# Patient Record
Sex: Female | Born: 1950
Health system: Southern US, Community
[De-identification: ages and names within clinical notes are randomized; demographics above are authoritative.]

## PROBLEM LIST (undated history)

## (undated) ENCOUNTER — Emergency Department (HOSPITAL_COMMUNITY): Admission: EM | Payer: Commercial Managed Care - HMO | Source: Home / Self Care

## (undated) DIAGNOSIS — F32A Depression, unspecified: Secondary | ICD-10-CM

## (undated) DIAGNOSIS — J45909 Unspecified asthma, uncomplicated: Secondary | ICD-10-CM

## (undated) DIAGNOSIS — T4145XA Adverse effect of unspecified anesthetic, initial encounter: Secondary | ICD-10-CM

## (undated) DIAGNOSIS — J189 Pneumonia, unspecified organism: Secondary | ICD-10-CM

## (undated) DIAGNOSIS — D649 Anemia, unspecified: Secondary | ICD-10-CM

## (undated) DIAGNOSIS — R519 Headache, unspecified: Secondary | ICD-10-CM

## (undated) DIAGNOSIS — Z8371 Family history of colonic polyps: Secondary | ICD-10-CM

## (undated) DIAGNOSIS — T8859XA Other complications of anesthesia, initial encounter: Secondary | ICD-10-CM

## (undated) DIAGNOSIS — G2401 Drug induced subacute dyskinesia: Secondary | ICD-10-CM

## (undated) DIAGNOSIS — I341 Nonrheumatic mitral (valve) prolapse: Secondary | ICD-10-CM

## (undated) DIAGNOSIS — R0600 Dyspnea, unspecified: Secondary | ICD-10-CM

## (undated) DIAGNOSIS — T43505A Adverse effect of unspecified antipsychotics and neuroleptics, initial encounter: Secondary | ICD-10-CM

## (undated) DIAGNOSIS — I10 Essential (primary) hypertension: Secondary | ICD-10-CM

## (undated) DIAGNOSIS — K219 Gastro-esophageal reflux disease without esophagitis: Secondary | ICD-10-CM

## (undated) DIAGNOSIS — Z87898 Personal history of other specified conditions: Secondary | ICD-10-CM

## (undated) DIAGNOSIS — M159 Polyosteoarthritis, unspecified: Secondary | ICD-10-CM

## (undated) DIAGNOSIS — K449 Diaphragmatic hernia without obstruction or gangrene: Secondary | ICD-10-CM

## (undated) DIAGNOSIS — J449 Chronic obstructive pulmonary disease, unspecified: Secondary | ICD-10-CM

## (undated) DIAGNOSIS — I471 Supraventricular tachycardia, unspecified: Secondary | ICD-10-CM

## (undated) DIAGNOSIS — B3781 Candidal esophagitis: Secondary | ICD-10-CM

## (undated) DIAGNOSIS — I639 Cerebral infarction, unspecified: Secondary | ICD-10-CM

## (undated) DIAGNOSIS — F419 Anxiety disorder, unspecified: Secondary | ICD-10-CM

## (undated) DIAGNOSIS — E119 Type 2 diabetes mellitus without complications: Secondary | ICD-10-CM

## (undated) DIAGNOSIS — Z9289 Personal history of other medical treatment: Secondary | ICD-10-CM

## (undated) DIAGNOSIS — Z8669 Personal history of other diseases of the nervous system and sense organs: Secondary | ICD-10-CM

## (undated) DIAGNOSIS — S42409A Unspecified fracture of lower end of unspecified humerus, initial encounter for closed fracture: Secondary | ICD-10-CM

## (undated) DIAGNOSIS — F319 Bipolar disorder, unspecified: Secondary | ICD-10-CM

## (undated) DIAGNOSIS — J302 Other seasonal allergic rhinitis: Secondary | ICD-10-CM

## (undated) DIAGNOSIS — N189 Chronic kidney disease, unspecified: Secondary | ICD-10-CM

## (undated) DIAGNOSIS — E785 Hyperlipidemia, unspecified: Secondary | ICD-10-CM

## (undated) DIAGNOSIS — F329 Major depressive disorder, single episode, unspecified: Secondary | ICD-10-CM

## (undated) DIAGNOSIS — Z83719 Family history of colon polyps, unspecified: Secondary | ICD-10-CM

## (undated) HISTORY — DX: Adverse effect of unspecified antipsychotics and neuroleptics, initial encounter: T43.505A

## (undated) HISTORY — DX: Gastro-esophageal reflux disease without esophagitis: K21.9

## (undated) HISTORY — DX: Bipolar disorder, unspecified: F31.9

## (undated) HISTORY — PX: TONSILLECTOMY: SUR1361

## (undated) HISTORY — PX: CARPAL TUNNEL RELEASE: SHX101

## (undated) HISTORY — PX: EYE SURGERY: SHX253

## (undated) HISTORY — DX: Type 2 diabetes mellitus without complications: E11.9

## (undated) HISTORY — PX: KNEE SURGERY: SHX244

## (undated) HISTORY — DX: Personal history of other medical treatment: Z92.89

## (undated) HISTORY — DX: Drug induced subacute dyskinesia: G24.01

## (undated) HISTORY — DX: Personal history of other diseases of the nervous system and sense organs: Z86.69

## (undated) HISTORY — DX: Depression, unspecified: F32.A

## (undated) HISTORY — PX: NOSE SURGERY: SHX723

## (undated) HISTORY — DX: Major depressive disorder, single episode, unspecified: F32.9

## (undated) HISTORY — DX: Unspecified fracture of lower end of unspecified humerus, initial encounter for closed fracture: S42.409A

## (undated) HISTORY — DX: Other seasonal allergic rhinitis: J30.2

## (undated) HISTORY — DX: Family history of colonic polyps: Z83.71

## (undated) HISTORY — DX: Diaphragmatic hernia without obstruction or gangrene: K44.9

## (undated) HISTORY — DX: Supraventricular tachycardia, unspecified: I47.10

## (undated) HISTORY — PX: FINGER SURGERY: SHX640

## (undated) HISTORY — PX: EXTERNAL EAR SURGERY: SHX627

## (undated) HISTORY — DX: Polyosteoarthritis, unspecified: M15.9

## (undated) HISTORY — PX: ELBOW SURGERY: SHX618

## (undated) HISTORY — DX: Candidal esophagitis: B37.81

## (undated) HISTORY — DX: Family history of colon polyps, unspecified: Z83.719

## (undated) HISTORY — DX: Hyperlipidemia, unspecified: E78.5

---

## 1998-04-05 ENCOUNTER — Other Ambulatory Visit: Admission: RE | Admit: 1998-04-05 | Discharge: 1998-04-05 | Payer: Self-pay | Admitting: Gynecology

## 1998-04-22 ENCOUNTER — Ambulatory Visit (HOSPITAL_COMMUNITY): Admission: RE | Admit: 1998-04-22 | Discharge: 1998-04-22 | Payer: Self-pay | Admitting: *Deleted

## 1998-04-29 ENCOUNTER — Other Ambulatory Visit: Admission: RE | Admit: 1998-04-29 | Discharge: 1998-04-29 | Payer: Self-pay | Admitting: Gynecology

## 1999-05-23 ENCOUNTER — Other Ambulatory Visit: Admission: RE | Admit: 1999-05-23 | Discharge: 1999-05-23 | Payer: Self-pay | Admitting: Gynecology

## 1999-08-01 ENCOUNTER — Encounter: Admission: RE | Admit: 1999-08-01 | Discharge: 1999-08-01 | Payer: Self-pay | Admitting: Family Medicine

## 2000-01-03 ENCOUNTER — Ambulatory Visit (HOSPITAL_BASED_OUTPATIENT_CLINIC_OR_DEPARTMENT_OTHER): Admission: RE | Admit: 2000-01-03 | Discharge: 2000-01-03 | Payer: Self-pay | Admitting: Orthopaedic Surgery

## 2000-01-17 ENCOUNTER — Ambulatory Visit (HOSPITAL_BASED_OUTPATIENT_CLINIC_OR_DEPARTMENT_OTHER): Admission: RE | Admit: 2000-01-17 | Discharge: 2000-01-17 | Payer: Self-pay | Admitting: Orthopaedic Surgery

## 2000-06-08 ENCOUNTER — Ambulatory Visit (HOSPITAL_COMMUNITY): Admission: RE | Admit: 2000-06-08 | Discharge: 2000-06-08 | Payer: Self-pay | Admitting: Gastroenterology

## 2000-06-08 ENCOUNTER — Encounter: Payer: Self-pay | Admitting: Gastroenterology

## 2000-07-27 ENCOUNTER — Other Ambulatory Visit: Admission: RE | Admit: 2000-07-27 | Discharge: 2000-07-27 | Payer: Self-pay | Admitting: Gynecology

## 2000-07-27 ENCOUNTER — Encounter (INDEPENDENT_AMBULATORY_CARE_PROVIDER_SITE_OTHER): Payer: Self-pay

## 2000-10-23 HISTORY — PX: OTHER SURGICAL HISTORY: SHX169

## 2003-02-12 ENCOUNTER — Emergency Department (HOSPITAL_COMMUNITY): Admission: EM | Admit: 2003-02-12 | Discharge: 2003-02-12 | Payer: Self-pay | Admitting: Emergency Medicine

## 2003-02-12 ENCOUNTER — Encounter: Payer: Self-pay | Admitting: Emergency Medicine

## 2003-06-16 ENCOUNTER — Encounter: Payer: Self-pay | Admitting: Internal Medicine

## 2003-06-17 ENCOUNTER — Other Ambulatory Visit: Admission: RE | Admit: 2003-06-17 | Discharge: 2003-06-17 | Payer: Self-pay | Admitting: Gynecology

## 2003-06-23 ENCOUNTER — Encounter: Admission: RE | Admit: 2003-06-23 | Discharge: 2003-06-23 | Payer: Self-pay | Admitting: Gynecology

## 2003-06-23 ENCOUNTER — Encounter: Payer: Self-pay | Admitting: Gynecology

## 2003-06-25 ENCOUNTER — Ambulatory Visit (HOSPITAL_COMMUNITY): Admission: RE | Admit: 2003-06-25 | Discharge: 2003-06-25 | Payer: Self-pay | Admitting: Internal Medicine

## 2003-06-25 ENCOUNTER — Encounter: Payer: Self-pay | Admitting: Internal Medicine

## 2003-10-10 ENCOUNTER — Ambulatory Visit (HOSPITAL_BASED_OUTPATIENT_CLINIC_OR_DEPARTMENT_OTHER): Admission: RE | Admit: 2003-10-10 | Discharge: 2003-10-10 | Payer: Self-pay | Admitting: Pulmonary Disease

## 2003-11-18 ENCOUNTER — Inpatient Hospital Stay (HOSPITAL_COMMUNITY): Admission: EM | Admit: 2003-11-18 | Discharge: 2003-11-24 | Payer: Self-pay | Admitting: Psychiatry

## 2003-11-18 ENCOUNTER — Emergency Department (HOSPITAL_COMMUNITY): Admission: EM | Admit: 2003-11-18 | Discharge: 2003-11-18 | Payer: Self-pay | Admitting: Emergency Medicine

## 2003-12-15 ENCOUNTER — Encounter: Payer: Self-pay | Admitting: Internal Medicine

## 2003-12-17 ENCOUNTER — Encounter: Admission: RE | Admit: 2003-12-17 | Discharge: 2003-12-17 | Payer: Self-pay | Admitting: *Deleted

## 2004-06-06 ENCOUNTER — Emergency Department (HOSPITAL_COMMUNITY): Admission: EM | Admit: 2004-06-06 | Discharge: 2004-06-06 | Payer: Self-pay | Admitting: Emergency Medicine

## 2004-07-06 ENCOUNTER — Encounter: Admission: RE | Admit: 2004-07-06 | Discharge: 2004-07-06 | Payer: Self-pay | Admitting: Internal Medicine

## 2004-07-08 ENCOUNTER — Encounter: Payer: Self-pay | Admitting: Internal Medicine

## 2004-08-10 ENCOUNTER — Encounter: Admission: RE | Admit: 2004-08-10 | Discharge: 2004-09-12 | Payer: Self-pay | Admitting: Neurosurgery

## 2004-10-25 ENCOUNTER — Ambulatory Visit: Payer: Self-pay | Admitting: Internal Medicine

## 2004-11-02 ENCOUNTER — Ambulatory Visit: Payer: Self-pay | Admitting: Internal Medicine

## 2004-11-03 ENCOUNTER — Ambulatory Visit: Payer: Self-pay | Admitting: Internal Medicine

## 2004-11-21 ENCOUNTER — Ambulatory Visit: Payer: Self-pay | Admitting: Internal Medicine

## 2004-11-25 ENCOUNTER — Ambulatory Visit: Payer: Self-pay | Admitting: Internal Medicine

## 2004-12-23 ENCOUNTER — Ambulatory Visit: Payer: Self-pay | Admitting: Internal Medicine

## 2005-01-18 ENCOUNTER — Encounter: Admission: RE | Admit: 2005-01-18 | Discharge: 2005-01-18 | Payer: Self-pay | Admitting: Internal Medicine

## 2005-02-20 ENCOUNTER — Ambulatory Visit: Payer: Self-pay | Admitting: Internal Medicine

## 2005-06-28 ENCOUNTER — Other Ambulatory Visit: Admission: RE | Admit: 2005-06-28 | Discharge: 2005-06-28 | Payer: Self-pay | Admitting: Gynecology

## 2005-06-29 ENCOUNTER — Ambulatory Visit: Payer: Self-pay | Admitting: Internal Medicine

## 2005-07-06 ENCOUNTER — Ambulatory Visit: Payer: Self-pay | Admitting: Internal Medicine

## 2005-07-14 ENCOUNTER — Encounter: Admission: RE | Admit: 2005-07-14 | Discharge: 2005-07-14 | Payer: Self-pay | Admitting: Internal Medicine

## 2005-07-19 ENCOUNTER — Encounter: Admission: RE | Admit: 2005-07-19 | Discharge: 2005-10-17 | Payer: Self-pay | Admitting: Internal Medicine

## 2005-08-17 ENCOUNTER — Ambulatory Visit: Payer: Self-pay | Admitting: Internal Medicine

## 2005-08-24 ENCOUNTER — Ambulatory Visit: Payer: Self-pay | Admitting: Internal Medicine

## 2005-11-24 ENCOUNTER — Ambulatory Visit: Payer: Self-pay | Admitting: Internal Medicine

## 2005-12-01 ENCOUNTER — Ambulatory Visit: Payer: Self-pay | Admitting: Internal Medicine

## 2005-12-27 ENCOUNTER — Encounter: Admission: RE | Admit: 2005-12-27 | Discharge: 2006-03-27 | Payer: Self-pay | Admitting: Internal Medicine

## 2006-02-07 ENCOUNTER — Ambulatory Visit: Payer: Self-pay | Admitting: Internal Medicine

## 2006-04-03 ENCOUNTER — Ambulatory Visit: Payer: Self-pay | Admitting: Internal Medicine

## 2006-04-10 ENCOUNTER — Ambulatory Visit: Payer: Self-pay | Admitting: Internal Medicine

## 2006-04-18 ENCOUNTER — Encounter: Admission: RE | Admit: 2006-04-18 | Discharge: 2006-07-17 | Payer: Self-pay | Admitting: Internal Medicine

## 2006-06-27 ENCOUNTER — Other Ambulatory Visit: Admission: RE | Admit: 2006-06-27 | Discharge: 2006-06-27 | Payer: Self-pay | Admitting: Gynecology

## 2006-07-10 ENCOUNTER — Encounter: Admission: RE | Admit: 2006-07-10 | Discharge: 2006-07-10 | Payer: Self-pay | Admitting: Internal Medicine

## 2006-07-16 ENCOUNTER — Encounter: Admission: RE | Admit: 2006-07-16 | Discharge: 2006-07-16 | Payer: Self-pay | Admitting: Internal Medicine

## 2006-08-10 ENCOUNTER — Ambulatory Visit: Payer: Self-pay | Admitting: Internal Medicine

## 2006-08-10 LAB — CONVERTED CEMR LAB: Hgb A1c MFr Bld: 6.5 % — ABNORMAL HIGH (ref 4.6–6.0)

## 2006-08-17 ENCOUNTER — Ambulatory Visit: Payer: Self-pay | Admitting: Internal Medicine

## 2006-09-10 ENCOUNTER — Other Ambulatory Visit: Admission: RE | Admit: 2006-09-10 | Discharge: 2006-09-10 | Payer: Self-pay | Admitting: Gynecology

## 2006-11-09 ENCOUNTER — Ambulatory Visit: Payer: Self-pay | Admitting: Internal Medicine

## 2006-11-09 LAB — CONVERTED CEMR LAB
BUN: 17 mg/dL (ref 6–23)
CO2: 31 meq/L (ref 19–32)
Calcium: 9.9 mg/dL (ref 8.4–10.5)
Chloride: 102 meq/L (ref 96–112)
Cholesterol: 102 mg/dL (ref 0–200)
Creatinine, Ser: 1.3 mg/dL — ABNORMAL HIGH (ref 0.4–1.2)
GFR calc Af Amer: 55 mL/min
GFR calc non Af Amer: 45 mL/min
Glucose, Bld: 117 mg/dL — ABNORMAL HIGH (ref 70–99)
HDL: 29.6 mg/dL — ABNORMAL LOW (ref >39.0)
Hgb A1c MFr Bld: 6.8 % — ABNORMAL HIGH (ref 4.6–6.0)
LDL Cholesterol: 46 mg/dL (ref 0–99)
Potassium: 4.2 meq/L (ref 3.5–5.1)
Sodium: 142 meq/L (ref 135–145)
TSH: 2.29 microintl units/mL (ref 0.35–5.50)
Total CHOL/HDL Ratio: 3.4
Triglycerides: 131 mg/dL (ref 0–149)
VLDL: 26 mg/dL (ref 0–40)

## 2006-11-16 ENCOUNTER — Ambulatory Visit: Payer: Self-pay | Admitting: Internal Medicine

## 2007-01-17 ENCOUNTER — Emergency Department (HOSPITAL_COMMUNITY): Admission: EM | Admit: 2007-01-17 | Discharge: 2007-01-17 | Payer: Self-pay | Admitting: Emergency Medicine

## 2007-02-01 ENCOUNTER — Ambulatory Visit: Payer: Self-pay | Admitting: Internal Medicine

## 2007-02-07 ENCOUNTER — Encounter: Payer: Self-pay | Admitting: Internal Medicine

## 2007-02-08 ENCOUNTER — Ambulatory Visit: Payer: Self-pay | Admitting: Internal Medicine

## 2007-02-08 LAB — CONVERTED CEMR LAB
BUN: 17 mg/dL (ref 6–23)
CO2: 28 meq/L (ref 19–32)
Calcium: 9.2 mg/dL (ref 8.4–10.5)
Chloride: 107 meq/L (ref 96–112)
Creatinine, Ser: 1.2 mg/dL (ref 0.4–1.2)
Creatinine,U: 228.7 mg/dL
GFR calc Af Amer: 60 mL/min
GFR calc non Af Amer: 50 mL/min
Glucose, Bld: 115 mg/dL — ABNORMAL HIGH (ref 70–99)
Hgb A1c MFr Bld: 6.7 % — ABNORMAL HIGH (ref 4.6–6.0)
Microalb Creat Ratio: 0.9 mg/g (ref 0.0–30.0)
Microalb, Ur: 0.2 mg/dL (ref 0.0–1.9)
Potassium: 3.7 meq/L (ref 3.5–5.1)
Sodium: 143 meq/L (ref 135–145)

## 2007-02-15 ENCOUNTER — Ambulatory Visit: Payer: Self-pay | Admitting: Internal Medicine

## 2007-02-28 ENCOUNTER — Encounter: Payer: Self-pay | Admitting: Internal Medicine

## 2007-04-08 ENCOUNTER — Encounter: Payer: Self-pay | Admitting: Internal Medicine

## 2007-04-08 DIAGNOSIS — K219 Gastro-esophageal reflux disease without esophagitis: Secondary | ICD-10-CM | POA: Insufficient documentation

## 2007-04-08 DIAGNOSIS — M545 Low back pain, unspecified: Secondary | ICD-10-CM | POA: Insufficient documentation

## 2007-04-08 DIAGNOSIS — E785 Hyperlipidemia, unspecified: Secondary | ICD-10-CM | POA: Insufficient documentation

## 2007-04-08 DIAGNOSIS — I059 Rheumatic mitral valve disease, unspecified: Secondary | ICD-10-CM | POA: Insufficient documentation

## 2007-04-08 DIAGNOSIS — R079 Chest pain, unspecified: Secondary | ICD-10-CM | POA: Insufficient documentation

## 2007-04-22 ENCOUNTER — Ambulatory Visit: Payer: Self-pay | Admitting: Internal Medicine

## 2007-05-20 ENCOUNTER — Telehealth: Payer: Self-pay | Admitting: Internal Medicine

## 2007-06-10 ENCOUNTER — Ambulatory Visit: Payer: Self-pay | Admitting: Internal Medicine

## 2007-06-15 LAB — CONVERTED CEMR LAB
ALT: 26 units/L (ref 0–35)
AST: 29 units/L (ref 0–37)
BUN: 18 mg/dL (ref 6–23)
Cholesterol: 89 mg/dL (ref 0–200)
Creatinine, Ser: 1.3 mg/dL — ABNORMAL HIGH (ref 0.4–1.2)
HDL: 24.4 mg/dL — ABNORMAL LOW (ref >39.0)
Hgb A1c MFr Bld: 6.6 % — ABNORMAL HIGH (ref 4.6–6.0)
LDL Cholesterol: 44 mg/dL (ref 0–99)
TSH: 1.86 microintl units/mL (ref 0.35–5.50)
Total CHOL/HDL Ratio: 3.6
Triglycerides: 103 mg/dL (ref 0–149)
VLDL: 21 mg/dL (ref 0–40)

## 2007-06-17 ENCOUNTER — Telehealth: Payer: Self-pay | Admitting: Internal Medicine

## 2007-06-17 ENCOUNTER — Ambulatory Visit: Payer: Self-pay | Admitting: Internal Medicine

## 2007-06-17 DIAGNOSIS — I10 Essential (primary) hypertension: Secondary | ICD-10-CM | POA: Insufficient documentation

## 2007-06-19 ENCOUNTER — Encounter: Payer: Self-pay | Admitting: Internal Medicine

## 2007-06-20 ENCOUNTER — Ambulatory Visit: Payer: Self-pay | Admitting: Internal Medicine

## 2007-07-31 ENCOUNTER — Telehealth: Payer: Self-pay | Admitting: *Deleted

## 2007-08-06 ENCOUNTER — Telehealth: Payer: Self-pay | Admitting: *Deleted

## 2007-08-20 ENCOUNTER — Other Ambulatory Visit: Admission: RE | Admit: 2007-08-20 | Discharge: 2007-08-20 | Payer: Self-pay | Admitting: Gynecology

## 2007-09-12 ENCOUNTER — Encounter: Admission: RE | Admit: 2007-09-12 | Discharge: 2007-09-12 | Payer: Self-pay | Admitting: Gynecology

## 2007-10-28 ENCOUNTER — Ambulatory Visit: Payer: Self-pay | Admitting: Internal Medicine

## 2007-10-30 LAB — CONVERTED CEMR LAB
BUN: 35 mg/dL — ABNORMAL HIGH (ref 6–23)
CO2: 31 meq/L (ref 19–32)
Calcium: 10 mg/dL (ref 8.4–10.5)
Chloride: 100 meq/L (ref 96–112)
Creatinine, Ser: 1.8 mg/dL — ABNORMAL HIGH (ref 0.4–1.2)
GFR calc Af Amer: 38 mL/min
GFR calc non Af Amer: 31 mL/min
Glucose, Bld: 102 mg/dL — ABNORMAL HIGH (ref 70–99)
Hgb A1c MFr Bld: 6.9 % — ABNORMAL HIGH (ref 4.6–6.0)
Potassium: 4.6 meq/L (ref 3.5–5.1)
Sodium: 141 meq/L (ref 135–145)

## 2007-11-04 ENCOUNTER — Ambulatory Visit: Payer: Self-pay | Admitting: Internal Medicine

## 2007-11-04 DIAGNOSIS — Z860100 Personal history of colon polyps, unspecified: Secondary | ICD-10-CM | POA: Insufficient documentation

## 2007-11-04 DIAGNOSIS — R42 Dizziness and giddiness: Secondary | ICD-10-CM | POA: Insufficient documentation

## 2007-11-04 LAB — CONVERTED CEMR LAB: Blood Glucose, Fingerstick: 85

## 2007-12-03 ENCOUNTER — Telehealth: Payer: Self-pay | Admitting: Internal Medicine

## 2007-12-04 ENCOUNTER — Ambulatory Visit: Payer: Self-pay | Admitting: Internal Medicine

## 2007-12-04 DIAGNOSIS — R259 Unspecified abnormal involuntary movements: Secondary | ICD-10-CM | POA: Insufficient documentation

## 2007-12-04 LAB — CONVERTED CEMR LAB
ALT: 35 units/L (ref 0–35)
AST: 33 units/L (ref 0–37)
Albumin: 4.4 g/dL (ref 3.5–5.2)
Alkaline Phosphatase: 51 units/L (ref 39–117)
Amylase: 76 units/L (ref 27–131)
BUN: 22 mg/dL (ref 6–23)
Basophils Absolute: 0 10*3/uL (ref 0.0–0.1)
Basophils Relative: 0.6 % (ref 0.0–1.0)
Bilirubin, Direct: 0.2 mg/dL (ref 0.0–0.3)
Blood Glucose, Fingerstick: 116
Blood in Urine, dipstick: NEGATIVE
CO2: 27 meq/L (ref 19–32)
Calcium: 10 mg/dL (ref 8.4–10.5)
Chloride: 99 meq/L (ref 96–112)
Creatinine, Ser: 1.9 mg/dL — ABNORMAL HIGH (ref 0.4–1.2)
Eosinophils Absolute: 0.5 10*3/uL (ref 0.0–0.6)
Eosinophils Relative: 5.6 % — ABNORMAL HIGH (ref 0.0–5.0)
GFR calc Af Amer: 35 mL/min
GFR calc non Af Amer: 29 mL/min
Glucose, Bld: 101 mg/dL — ABNORMAL HIGH (ref 70–99)
Glucose, Urine, Semiquant: NEGATIVE
HCT: 35.2 % — ABNORMAL LOW (ref 36.0–46.0)
Hemoglobin: 11.6 g/dL — ABNORMAL LOW (ref 12.0–15.0)
Lipase: 43 units/L (ref 11.0–59.0)
Lymphocytes Relative: 44.2 % (ref 12.0–46.0)
MCHC: 33.1 g/dL (ref 30.0–36.0)
MCV: 91.9 fL (ref 78.0–100.0)
Monocytes Absolute: 0.7 10*3/uL (ref 0.2–0.7)
Monocytes Relative: 8.3 % (ref 3.0–11.0)
Neutro Abs: 3.3 10*3/uL (ref 1.4–7.7)
Neutrophils Relative %: 41.3 % — ABNORMAL LOW (ref 43.0–77.0)
Nitrite: NEGATIVE
Platelets: 314 10*3/uL (ref 150–400)
Potassium: 3.8 meq/L (ref 3.5–5.1)
RBC: 3.82 M/uL — ABNORMAL LOW (ref 3.87–5.11)
RDW: 15 % — ABNORMAL HIGH (ref 11.5–14.6)
Sodium: 140 meq/L (ref 135–145)
Specific Gravity, Urine: 1.025
Total Bilirubin: 0.9 mg/dL (ref 0.3–1.2)
Total Protein: 7.2 g/dL (ref 6.0–8.3)
Urobilinogen, UA: 0.2
WBC Urine, dipstick: NEGATIVE
WBC: 8.1 10*3/uL (ref 4.5–10.5)
pH: 5.5

## 2007-12-10 ENCOUNTER — Ambulatory Visit: Payer: Self-pay | Admitting: Internal Medicine

## 2007-12-10 LAB — CONVERTED CEMR LAB: Creatinine, Ser: 2.3 mg/dL — ABNORMAL HIGH (ref 0.4–1.2)

## 2007-12-11 ENCOUNTER — Telehealth (INDEPENDENT_AMBULATORY_CARE_PROVIDER_SITE_OTHER): Payer: Self-pay | Admitting: *Deleted

## 2007-12-11 ENCOUNTER — Telehealth: Payer: Self-pay | Admitting: Internal Medicine

## 2007-12-11 ENCOUNTER — Encounter: Admission: RE | Admit: 2007-12-11 | Discharge: 2007-12-11 | Payer: Self-pay | Admitting: Internal Medicine

## 2007-12-12 ENCOUNTER — Telehealth: Payer: Self-pay | Admitting: Internal Medicine

## 2007-12-13 ENCOUNTER — Telehealth: Payer: Self-pay | Admitting: Internal Medicine

## 2007-12-17 ENCOUNTER — Telehealth: Payer: Self-pay | Admitting: Internal Medicine

## 2007-12-19 ENCOUNTER — Telehealth: Payer: Self-pay | Admitting: *Deleted

## 2007-12-20 ENCOUNTER — Ambulatory Visit: Payer: Self-pay | Admitting: Internal Medicine

## 2007-12-20 LAB — CONVERTED CEMR LAB
BUN: 10 mg/dL (ref 6–23)
CO2: 33 meq/L — ABNORMAL HIGH (ref 19–32)
Calcium: 9.7 mg/dL (ref 8.4–10.5)
Chloride: 103 meq/L (ref 96–112)
Creatinine, Ser: 1.6 mg/dL — ABNORMAL HIGH (ref 0.4–1.2)
GFR calc Af Amer: 43 mL/min
GFR calc non Af Amer: 36 mL/min
Glucose, Bld: 120 mg/dL — ABNORMAL HIGH (ref 70–99)
Potassium: 3.6 meq/L (ref 3.5–5.1)
Sodium: 144 meq/L (ref 135–145)

## 2007-12-26 ENCOUNTER — Telehealth: Payer: Self-pay | Admitting: *Deleted

## 2007-12-27 ENCOUNTER — Ambulatory Visit: Payer: Self-pay | Admitting: Internal Medicine

## 2008-01-09 ENCOUNTER — Ambulatory Visit: Payer: Self-pay | Admitting: Internal Medicine

## 2008-01-13 ENCOUNTER — Telehealth: Payer: Self-pay | Admitting: *Deleted

## 2008-01-15 ENCOUNTER — Telehealth: Payer: Self-pay | Admitting: Internal Medicine

## 2008-01-21 ENCOUNTER — Ambulatory Visit: Payer: Self-pay | Admitting: Internal Medicine

## 2008-01-21 ENCOUNTER — Encounter: Payer: Self-pay | Admitting: Internal Medicine

## 2008-02-11 ENCOUNTER — Telehealth: Payer: Self-pay | Admitting: Internal Medicine

## 2008-02-12 ENCOUNTER — Ambulatory Visit: Payer: Self-pay | Admitting: Internal Medicine

## 2008-02-12 DIAGNOSIS — M79609 Pain in unspecified limb: Secondary | ICD-10-CM | POA: Insufficient documentation

## 2008-02-12 DIAGNOSIS — M549 Dorsalgia, unspecified: Secondary | ICD-10-CM | POA: Insufficient documentation

## 2008-02-19 ENCOUNTER — Encounter: Payer: Self-pay | Admitting: Internal Medicine

## 2008-03-03 ENCOUNTER — Telehealth: Payer: Self-pay | Admitting: Internal Medicine

## 2008-03-03 ENCOUNTER — Telehealth (INDEPENDENT_AMBULATORY_CARE_PROVIDER_SITE_OTHER): Payer: Self-pay | Admitting: *Deleted

## 2008-03-04 ENCOUNTER — Ambulatory Visit: Payer: Self-pay | Admitting: Internal Medicine

## 2008-03-04 DIAGNOSIS — R55 Syncope and collapse: Secondary | ICD-10-CM | POA: Insufficient documentation

## 2008-03-09 ENCOUNTER — Encounter: Payer: Self-pay | Admitting: Internal Medicine

## 2008-03-11 ENCOUNTER — Ambulatory Visit: Payer: Self-pay | Admitting: Internal Medicine

## 2008-03-11 DIAGNOSIS — D649 Anemia, unspecified: Secondary | ICD-10-CM | POA: Insufficient documentation

## 2008-03-11 DIAGNOSIS — R609 Edema, unspecified: Secondary | ICD-10-CM | POA: Insufficient documentation

## 2008-03-11 LAB — CONVERTED CEMR LAB
Bilirubin Urine: NEGATIVE
Blood in Urine, dipstick: NEGATIVE
Glucose, Urine, Semiquant: NEGATIVE
Ketones, urine, test strip: NEGATIVE
Nitrite: NEGATIVE
Protein, U semiquant: NEGATIVE
Specific Gravity, Urine: <1.005
Urobilinogen, UA: NEGATIVE
pH: 5.5

## 2008-03-16 LAB — CONVERTED CEMR LAB
Creatinine,U: 66.3 mg/dL
Microalb Creat Ratio: 13.6 mg/g (ref 0.0–30.0)
Microalb, Ur: 0.9 mg/dL (ref 0.0–1.9)

## 2008-03-19 ENCOUNTER — Ambulatory Visit: Payer: Self-pay | Admitting: Internal Medicine

## 2008-03-27 ENCOUNTER — Encounter: Payer: Self-pay | Admitting: Internal Medicine

## 2008-03-31 ENCOUNTER — Telehealth: Payer: Self-pay | Admitting: *Deleted

## 2008-04-14 ENCOUNTER — Ambulatory Visit: Payer: Self-pay | Admitting: Internal Medicine

## 2008-04-21 ENCOUNTER — Ambulatory Visit: Payer: Self-pay | Admitting: Internal Medicine

## 2008-04-21 DIAGNOSIS — F39 Unspecified mood [affective] disorder: Secondary | ICD-10-CM

## 2008-04-21 DIAGNOSIS — F340 Cyclothymic disorder: Secondary | ICD-10-CM | POA: Insufficient documentation

## 2008-04-22 ENCOUNTER — Telehealth: Payer: Self-pay | Admitting: Internal Medicine

## 2008-04-27 ENCOUNTER — Encounter: Payer: Self-pay | Admitting: Internal Medicine

## 2008-04-27 ENCOUNTER — Ambulatory Visit: Payer: Self-pay

## 2008-05-02 ENCOUNTER — Emergency Department (HOSPITAL_COMMUNITY): Admission: EM | Admit: 2008-05-02 | Discharge: 2008-05-02 | Payer: Self-pay | Admitting: Emergency Medicine

## 2008-05-05 ENCOUNTER — Encounter: Payer: Self-pay | Admitting: Internal Medicine

## 2008-05-06 ENCOUNTER — Ambulatory Visit: Payer: Self-pay | Admitting: Internal Medicine

## 2008-05-06 LAB — CONVERTED CEMR LAB: Blood Glucose, Fingerstick: 179

## 2008-05-11 ENCOUNTER — Telehealth: Payer: Self-pay | Admitting: Internal Medicine

## 2008-05-12 ENCOUNTER — Encounter: Payer: Self-pay | Admitting: Internal Medicine

## 2008-05-20 ENCOUNTER — Telehealth: Payer: Self-pay | Admitting: Internal Medicine

## 2008-05-21 ENCOUNTER — Telehealth: Payer: Self-pay | Admitting: Internal Medicine

## 2008-06-02 ENCOUNTER — Encounter: Admission: RE | Admit: 2008-06-02 | Discharge: 2008-07-14 | Payer: Self-pay | Admitting: Internal Medicine

## 2008-06-15 ENCOUNTER — Telehealth: Payer: Self-pay | Admitting: Internal Medicine

## 2008-06-17 ENCOUNTER — Telehealth: Payer: Self-pay | Admitting: Internal Medicine

## 2008-06-18 ENCOUNTER — Telehealth: Payer: Self-pay | Admitting: Internal Medicine

## 2008-06-25 ENCOUNTER — Ambulatory Visit: Payer: Self-pay | Admitting: Internal Medicine

## 2008-06-30 ENCOUNTER — Encounter: Payer: Self-pay | Admitting: Internal Medicine

## 2008-07-20 ENCOUNTER — Telehealth: Payer: Self-pay | Admitting: Internal Medicine

## 2008-07-21 ENCOUNTER — Ambulatory Visit: Payer: Self-pay | Admitting: Internal Medicine

## 2008-07-21 ENCOUNTER — Telehealth: Payer: Self-pay | Admitting: Internal Medicine

## 2008-07-21 DIAGNOSIS — D721 Eosinophilia, unspecified: Secondary | ICD-10-CM | POA: Insufficient documentation

## 2008-07-23 ENCOUNTER — Telehealth (INDEPENDENT_AMBULATORY_CARE_PROVIDER_SITE_OTHER): Payer: Self-pay | Admitting: *Deleted

## 2008-07-23 ENCOUNTER — Telehealth: Payer: Self-pay | Admitting: Internal Medicine

## 2008-07-24 ENCOUNTER — Telehealth: Payer: Self-pay | Admitting: *Deleted

## 2008-07-27 ENCOUNTER — Ambulatory Visit: Payer: Self-pay | Admitting: Internal Medicine

## 2008-07-28 ENCOUNTER — Encounter: Payer: Self-pay | Admitting: Internal Medicine

## 2008-07-28 ENCOUNTER — Telehealth: Payer: Self-pay | Admitting: Internal Medicine

## 2008-07-28 LAB — CONVERTED CEMR LAB
Basophils Absolute: 0.1 10*3/uL (ref 0.0–0.1)
Basophils Relative: 0.9 % (ref 0.0–3.0)
Eosinophils Absolute: 0.9 10*3/uL — ABNORMAL HIGH (ref 0.0–0.7)
Eosinophils Relative: 11 % — ABNORMAL HIGH (ref 0.0–5.0)
HCT: 34.5 % — ABNORMAL LOW (ref 36.0–46.0)
Hemoglobin: 11.7 g/dL — ABNORMAL LOW (ref 12.0–15.0)
Lymphocytes Relative: 40.6 % (ref 12.0–46.0)
MCHC: 34 g/dL (ref 30.0–36.0)
MCV: 90.3 fL (ref 78.0–100.0)
Monocytes Absolute: 0.5 10*3/uL (ref 0.1–1.0)
Monocytes Relative: 6.4 % (ref 3.0–12.0)
Neutro Abs: 3.3 10*3/uL (ref 1.4–7.7)
Neutrophils Relative %: 41.1 % — ABNORMAL LOW (ref 43.0–77.0)
Platelets: 331 10*3/uL (ref 150–400)
RBC: 3.82 M/uL — ABNORMAL LOW (ref 3.87–5.11)
RDW: 16.2 % — ABNORMAL HIGH (ref 11.5–14.6)
WBC: 8.1 10*3/uL (ref 4.5–10.5)

## 2008-07-31 ENCOUNTER — Telehealth: Payer: Self-pay | Admitting: *Deleted

## 2008-08-05 ENCOUNTER — Encounter: Payer: Self-pay | Admitting: Internal Medicine

## 2008-08-07 ENCOUNTER — Encounter: Payer: Self-pay | Admitting: Internal Medicine

## 2008-08-11 ENCOUNTER — Telehealth: Payer: Self-pay | Admitting: Internal Medicine

## 2008-08-19 ENCOUNTER — Other Ambulatory Visit: Admission: RE | Admit: 2008-08-19 | Discharge: 2008-08-19 | Payer: Self-pay | Admitting: Gynecology

## 2008-09-01 ENCOUNTER — Ambulatory Visit: Payer: Self-pay | Admitting: Internal Medicine

## 2008-09-01 LAB — CONVERTED CEMR LAB: Blood Glucose, Fingerstick: 147

## 2008-09-04 ENCOUNTER — Telehealth: Payer: Self-pay | Admitting: Internal Medicine

## 2008-09-16 ENCOUNTER — Ambulatory Visit: Payer: Self-pay | Admitting: Internal Medicine

## 2008-09-16 DIAGNOSIS — M25579 Pain in unspecified ankle and joints of unspecified foot: Secondary | ICD-10-CM | POA: Insufficient documentation

## 2008-10-06 ENCOUNTER — Encounter: Admission: RE | Admit: 2008-10-06 | Discharge: 2008-10-06 | Payer: Self-pay | Admitting: Internal Medicine

## 2008-10-08 ENCOUNTER — Telehealth: Payer: Self-pay | Admitting: Internal Medicine

## 2008-10-27 ENCOUNTER — Ambulatory Visit: Payer: Self-pay | Admitting: Internal Medicine

## 2008-10-27 LAB — CONVERTED CEMR LAB
BUN: 18 mg/dL (ref 6–23)
Basophils Absolute: 0 10*3/uL (ref 0.0–0.1)
Basophils Relative: 0.3 % (ref 0.0–3.0)
CO2: 29 meq/L (ref 19–32)
Calcium: 9.4 mg/dL (ref 8.4–10.5)
Chloride: 102 meq/L (ref 96–112)
Creatinine, Ser: 1.5 mg/dL — ABNORMAL HIGH (ref 0.4–1.2)
Eosinophils Absolute: 0.3 10*3/uL (ref 0.0–0.7)
Eosinophils Relative: 3.8 % (ref 0.0–5.0)
GFR calc Af Amer: 46 mL/min
GFR calc non Af Amer: 38 mL/min
Glucose, Bld: 113 mg/dL — ABNORMAL HIGH (ref 70–99)
HCT: 34.1 % — ABNORMAL LOW (ref 36.0–46.0)
Hemoglobin: 11.4 g/dL — ABNORMAL LOW (ref 12.0–15.0)
Hgb A1c MFr Bld: 6.2 % — ABNORMAL HIGH (ref 4.6–6.0)
Lymphocytes Relative: 50.4 % — ABNORMAL HIGH (ref 12.0–46.0)
MCHC: 33.5 g/dL (ref 30.0–36.0)
MCV: 93.8 fL (ref 78.0–100.0)
Monocytes Absolute: 0.6 10*3/uL (ref 0.1–1.0)
Monocytes Relative: 8.5 % (ref 3.0–12.0)
Neutro Abs: 2.7 10*3/uL (ref 1.4–7.7)
Neutrophils Relative %: 37 % — ABNORMAL LOW (ref 43.0–77.0)
Platelets: 285 10*3/uL (ref 150–400)
Potassium: 4.2 meq/L (ref 3.5–5.1)
RBC: 3.64 M/uL — ABNORMAL LOW (ref 3.87–5.11)
RDW: 14.8 % — ABNORMAL HIGH (ref 11.5–14.6)
Sodium: 140 meq/L (ref 135–145)
WBC: 7.2 10*3/uL (ref 4.5–10.5)

## 2008-11-04 ENCOUNTER — Telehealth: Payer: Self-pay | Admitting: Internal Medicine

## 2008-11-05 ENCOUNTER — Telehealth: Payer: Self-pay | Admitting: Internal Medicine

## 2008-11-09 ENCOUNTER — Ambulatory Visit: Payer: Self-pay | Admitting: Internal Medicine

## 2008-11-09 LAB — CONVERTED CEMR LAB: Blood Glucose, Fingerstick: 122

## 2008-11-13 ENCOUNTER — Ambulatory Visit: Payer: Self-pay | Admitting: Internal Medicine

## 2008-11-16 ENCOUNTER — Encounter: Payer: Self-pay | Admitting: Internal Medicine

## 2008-11-17 ENCOUNTER — Telehealth: Payer: Self-pay | Admitting: *Deleted

## 2008-12-09 ENCOUNTER — Ambulatory Visit: Payer: Self-pay | Admitting: Internal Medicine

## 2008-12-14 ENCOUNTER — Telehealth: Payer: Self-pay | Admitting: Internal Medicine

## 2009-01-18 ENCOUNTER — Telehealth: Payer: Self-pay | Admitting: Internal Medicine

## 2009-01-27 ENCOUNTER — Telehealth: Payer: Self-pay | Admitting: Internal Medicine

## 2009-02-22 ENCOUNTER — Telehealth: Payer: Self-pay | Admitting: Internal Medicine

## 2009-02-24 ENCOUNTER — Telehealth: Payer: Self-pay | Admitting: Internal Medicine

## 2009-03-05 ENCOUNTER — Encounter: Payer: Self-pay | Admitting: Internal Medicine

## 2009-03-08 ENCOUNTER — Ambulatory Visit: Payer: Self-pay | Admitting: Internal Medicine

## 2009-03-08 LAB — CONVERTED CEMR LAB
BUN: 17 mg/dL (ref 6–23)
Basophils Absolute: 0 10*3/uL (ref 0.0–0.1)
Basophils Relative: 0.5 % (ref 0.0–3.0)
CO2: 31 meq/L (ref 19–32)
Calcium: 9.3 mg/dL (ref 8.4–10.5)
Chloride: 104 meq/L (ref 96–112)
Cholesterol: 116 mg/dL (ref 0–200)
Creatinine, Ser: 1.7 mg/dL — ABNORMAL HIGH (ref 0.4–1.2)
Eosinophils Absolute: 0.2 10*3/uL (ref 0.0–0.7)
Eosinophils Relative: 2.4 % (ref 0.0–5.0)
GFR calc non Af Amer: 39.85 mL/min (ref >60)
Glucose, Bld: 94 mg/dL (ref 70–99)
HCT: 34 % — ABNORMAL LOW (ref 36.0–46.0)
HDL: 30.5 mg/dL — ABNORMAL LOW (ref >39.00)
Hemoglobin: 11.4 g/dL — ABNORMAL LOW (ref 12.0–15.0)
Hgb A1c MFr Bld: 6.5 % (ref 4.6–6.5)
LDL Cholesterol: 50 mg/dL (ref 0–99)
Lymphocytes Relative: 55.3 % — ABNORMAL HIGH (ref 12.0–46.0)
Lymphs Abs: 4.5 10*3/uL — ABNORMAL HIGH (ref 0.7–4.0)
MCHC: 33.4 g/dL (ref 30.0–36.0)
MCV: 93 fL (ref 78.0–100.0)
Monocytes Absolute: 0.6 10*3/uL (ref 0.1–1.0)
Monocytes Relative: 7.9 % (ref 3.0–12.0)
Neutro Abs: 2.7 10*3/uL (ref 1.4–7.7)
Neutrophils Relative %: 33.9 % — ABNORMAL LOW (ref 43.0–77.0)
Platelets: 304 10*3/uL (ref 150.0–400.0)
Potassium: 3.5 meq/L (ref 3.5–5.1)
RBC: 3.65 M/uL — ABNORMAL LOW (ref 3.87–5.11)
RDW: 15.1 % — ABNORMAL HIGH (ref 11.5–14.6)
Sodium: 141 meq/L (ref 135–145)
TSH: 1.68 microintl units/mL (ref 0.35–5.50)
Total CHOL/HDL Ratio: 4
Triglycerides: 177 mg/dL — ABNORMAL HIGH (ref 0.0–149.0)
VLDL: 35.4 mg/dL (ref 0.0–40.0)
WBC: 8 10*3/uL (ref 4.5–10.5)

## 2009-03-15 ENCOUNTER — Ambulatory Visit: Payer: Self-pay | Admitting: Internal Medicine

## 2009-03-15 LAB — CONVERTED CEMR LAB: Blood Glucose, Fingerstick: 211

## 2009-03-23 LAB — CONVERTED CEMR LAB
Creatinine,U: 113.2 mg/dL
Microalb Creat Ratio: 1.8 mg/g (ref 0.0–30.0)
Microalb, Ur: 0.2 mg/dL (ref 0.0–1.9)

## 2009-03-24 ENCOUNTER — Telehealth (INDEPENDENT_AMBULATORY_CARE_PROVIDER_SITE_OTHER): Payer: Self-pay | Admitting: *Deleted

## 2009-03-25 ENCOUNTER — Ambulatory Visit: Payer: Self-pay | Admitting: Internal Medicine

## 2009-03-26 ENCOUNTER — Telehealth: Payer: Self-pay | Admitting: Internal Medicine

## 2009-03-26 ENCOUNTER — Ambulatory Visit: Payer: Self-pay | Admitting: Internal Medicine

## 2009-04-05 ENCOUNTER — Telehealth: Payer: Self-pay | Admitting: Internal Medicine

## 2009-04-13 ENCOUNTER — Telehealth: Payer: Self-pay | Admitting: Internal Medicine

## 2009-04-13 DIAGNOSIS — H269 Unspecified cataract: Secondary | ICD-10-CM | POA: Insufficient documentation

## 2009-04-16 ENCOUNTER — Telehealth (INDEPENDENT_AMBULATORY_CARE_PROVIDER_SITE_OTHER): Payer: Self-pay | Admitting: *Deleted

## 2009-05-17 ENCOUNTER — Encounter: Payer: Self-pay | Admitting: Internal Medicine

## 2009-05-21 ENCOUNTER — Ambulatory Visit: Payer: Self-pay | Admitting: Internal Medicine

## 2009-05-24 ENCOUNTER — Encounter: Payer: Self-pay | Admitting: Internal Medicine

## 2009-05-27 LAB — CONVERTED CEMR LAB
BUN: 15 mg/dL (ref 6–23)
Basophils Absolute: 0 10*3/uL (ref 0.0–0.1)
Basophils Relative: 0.1 % (ref 0.0–3.0)
CO2: 28 meq/L (ref 19–32)
Calcium: 9.9 mg/dL (ref 8.4–10.5)
Chloride: 105 meq/L (ref 96–112)
Creatinine, Ser: 1.5 mg/dL — ABNORMAL HIGH (ref 0.4–1.2)
Eosinophils Absolute: 0 10*3/uL (ref 0.0–0.7)
Eosinophils Relative: 0.4 % (ref 0.0–5.0)
Folate: >20 ng/mL
GFR calc non Af Amer: 46.01 mL/min (ref >60)
Glucose, Bld: 80 mg/dL (ref 70–99)
HCT: 36.3 % (ref 36.0–46.0)
Hemoglobin: 12.3 g/dL (ref 12.0–15.0)
Iron: 51 ug/dL (ref 42–145)
Lymphocytes Relative: 36.1 % (ref 12.0–46.0)
Lymphs Abs: 3.5 10*3/uL (ref 0.7–4.0)
MCHC: 34 g/dL (ref 30.0–36.0)
MCV: 91.4 fL (ref 78.0–100.0)
Monocytes Absolute: 0.7 10*3/uL (ref 0.1–1.0)
Monocytes Relative: 7.6 % (ref 3.0–12.0)
Neutro Abs: 5.4 10*3/uL (ref 1.4–7.7)
Neutrophils Relative %: 55.8 % (ref 43.0–77.0)
Platelets: 313 10*3/uL (ref 150.0–400.0)
Potassium: 4.2 meq/L (ref 3.5–5.1)
RBC: 3.97 M/uL (ref 3.87–5.11)
RDW: 15.7 % — ABNORMAL HIGH (ref 11.5–14.6)
Saturation Ratios: 18.4 % — ABNORMAL LOW (ref 20.0–50.0)
Sodium: 141 meq/L (ref 135–145)
Transferrin: 198.3 mg/dL — ABNORMAL LOW (ref 212.0–360.0)
Vitamin B-12: >1500 pg/mL — ABNORMAL HIGH (ref 211–911)
WBC: 9.6 10*3/uL (ref 4.5–10.5)

## 2009-05-28 ENCOUNTER — Telehealth: Payer: Self-pay | Admitting: *Deleted

## 2009-05-31 ENCOUNTER — Telehealth: Payer: Self-pay | Admitting: *Deleted

## 2009-06-07 ENCOUNTER — Ambulatory Visit: Payer: Self-pay | Admitting: Family Medicine

## 2009-06-08 ENCOUNTER — Telehealth: Payer: Self-pay | Admitting: *Deleted

## 2009-06-22 ENCOUNTER — Encounter: Payer: Self-pay | Admitting: Internal Medicine

## 2009-06-25 ENCOUNTER — Encounter: Admission: RE | Admit: 2009-06-25 | Discharge: 2009-06-25 | Payer: Self-pay | Admitting: Gastroenterology

## 2009-06-25 ENCOUNTER — Encounter: Payer: Self-pay | Admitting: Internal Medicine

## 2009-06-30 ENCOUNTER — Telehealth: Payer: Self-pay | Admitting: Internal Medicine

## 2009-07-07 ENCOUNTER — Encounter: Payer: Self-pay | Admitting: Internal Medicine

## 2009-07-28 ENCOUNTER — Ambulatory Visit: Payer: Self-pay | Admitting: Internal Medicine

## 2009-07-28 DIAGNOSIS — B3781 Candidal esophagitis: Secondary | ICD-10-CM

## 2009-07-28 DIAGNOSIS — Z87891 Personal history of nicotine dependence: Secondary | ICD-10-CM | POA: Insufficient documentation

## 2009-07-28 HISTORY — DX: Candidal esophagitis: B37.81

## 2009-07-29 ENCOUNTER — Telehealth: Payer: Self-pay | Admitting: *Deleted

## 2009-08-13 ENCOUNTER — Telehealth: Payer: Self-pay | Admitting: Internal Medicine

## 2009-08-24 ENCOUNTER — Telehealth: Payer: Self-pay | Admitting: *Deleted

## 2009-08-31 ENCOUNTER — Telehealth: Payer: Self-pay | Admitting: *Deleted

## 2009-09-15 ENCOUNTER — Telehealth: Payer: Self-pay | Admitting: *Deleted

## 2009-09-23 ENCOUNTER — Telehealth: Payer: Self-pay | Admitting: Internal Medicine

## 2009-09-27 ENCOUNTER — Telehealth: Payer: Self-pay | Admitting: Internal Medicine

## 2009-09-27 ENCOUNTER — Ambulatory Visit: Payer: Self-pay | Admitting: Internal Medicine

## 2009-09-27 LAB — CONVERTED CEMR LAB: Rapid Strep: POSITIVE

## 2009-09-28 ENCOUNTER — Telehealth: Payer: Self-pay | Admitting: Internal Medicine

## 2009-10-04 ENCOUNTER — Telehealth: Payer: Self-pay | Admitting: Internal Medicine

## 2009-10-11 ENCOUNTER — Telehealth: Payer: Self-pay | Admitting: *Deleted

## 2009-10-12 ENCOUNTER — Ambulatory Visit: Payer: Self-pay | Admitting: Internal Medicine

## 2009-10-14 ENCOUNTER — Encounter (INDEPENDENT_AMBULATORY_CARE_PROVIDER_SITE_OTHER): Payer: Self-pay | Admitting: *Deleted

## 2009-10-20 ENCOUNTER — Encounter: Payer: Self-pay | Admitting: Internal Medicine

## 2009-10-25 ENCOUNTER — Telehealth: Payer: Self-pay | Admitting: Internal Medicine

## 2009-10-27 ENCOUNTER — Ambulatory Visit: Payer: Self-pay | Admitting: Internal Medicine

## 2009-10-27 LAB — CONVERTED CEMR LAB
BUN: 10 mg/dL (ref 6–23)
Basophils Absolute: 0.1 10*3/uL (ref 0.0–0.1)
Basophils Relative: 0.8 % (ref 0.0–3.0)
CO2: 29 meq/L (ref 19–32)
Calcium: 9.6 mg/dL (ref 8.4–10.5)
Chloride: 106 meq/L (ref 96–112)
Creatinine, Ser: 1.6 mg/dL — ABNORMAL HIGH (ref 0.4–1.2)
Eosinophils Absolute: 0.2 10*3/uL (ref 0.0–0.7)
Eosinophils Relative: 2.5 % (ref 0.0–5.0)
GFR calc non Af Amer: 42.64 mL/min (ref >60)
Glucose, Bld: 94 mg/dL (ref 70–99)
HCT: 35.5 % — ABNORMAL LOW (ref 36.0–46.0)
Hemoglobin: 11.6 g/dL — ABNORMAL LOW (ref 12.0–15.0)
Hgb A1c MFr Bld: 6.4 % (ref 4.6–6.5)
Lymphocytes Relative: 39.8 % (ref 12.0–46.0)
Lymphs Abs: 3.8 10*3/uL (ref 0.7–4.0)
MCHC: 32.6 g/dL (ref 30.0–36.0)
MCV: 94.6 fL (ref 78.0–100.0)
Monocytes Absolute: 0.6 10*3/uL (ref 0.1–1.0)
Monocytes Relative: 6.8 % (ref 3.0–12.0)
Neutro Abs: 4.8 10*3/uL (ref 1.4–7.7)
Neutrophils Relative %: 50.1 % (ref 43.0–77.0)
Platelets: 331 10*3/uL (ref 150.0–400.0)
Potassium: 4 meq/L (ref 3.5–5.1)
RBC: 3.75 M/uL — ABNORMAL LOW (ref 3.87–5.11)
RDW: 14.5 % (ref 11.5–14.6)
Sodium: 141 meq/L (ref 135–145)
TSH: 0.68 microintl units/mL (ref 0.35–5.50)
WBC: 9.5 10*3/uL (ref 4.5–10.5)

## 2009-11-03 ENCOUNTER — Ambulatory Visit: Payer: Self-pay | Admitting: Internal Medicine

## 2009-11-04 ENCOUNTER — Telehealth: Payer: Self-pay | Admitting: Internal Medicine

## 2009-11-05 ENCOUNTER — Telehealth: Payer: Self-pay | Admitting: Internal Medicine

## 2009-11-08 ENCOUNTER — Encounter: Admission: RE | Admit: 2009-11-08 | Discharge: 2009-11-08 | Payer: Self-pay | Admitting: Orthopaedic Surgery

## 2009-11-18 ENCOUNTER — Telehealth: Payer: Self-pay | Admitting: Internal Medicine

## 2009-11-18 ENCOUNTER — Ambulatory Visit: Payer: Self-pay | Admitting: Family Medicine

## 2009-11-26 ENCOUNTER — Encounter: Payer: Self-pay | Admitting: Internal Medicine

## 2009-11-29 ENCOUNTER — Telehealth: Payer: Self-pay | Admitting: Internal Medicine

## 2009-12-06 ENCOUNTER — Telehealth: Payer: Self-pay | Admitting: Internal Medicine

## 2009-12-10 ENCOUNTER — Telehealth: Payer: Self-pay | Admitting: *Deleted

## 2009-12-11 ENCOUNTER — Ambulatory Visit: Payer: Self-pay | Admitting: Family Medicine

## 2009-12-24 ENCOUNTER — Telehealth: Payer: Self-pay | Admitting: *Deleted

## 2009-12-27 ENCOUNTER — Telehealth: Payer: Self-pay | Admitting: Internal Medicine

## 2009-12-29 ENCOUNTER — Encounter: Payer: Self-pay | Admitting: *Deleted

## 2009-12-30 ENCOUNTER — Telehealth: Payer: Self-pay | Admitting: *Deleted

## 2010-01-06 ENCOUNTER — Encounter: Payer: Self-pay | Admitting: Internal Medicine

## 2010-01-06 ENCOUNTER — Inpatient Hospital Stay (HOSPITAL_COMMUNITY): Admission: EM | Admit: 2010-01-06 | Discharge: 2010-01-08 | Payer: Self-pay | Admitting: Emergency Medicine

## 2010-01-10 ENCOUNTER — Encounter: Admission: RE | Admit: 2010-01-10 | Discharge: 2010-04-10 | Payer: Self-pay | Admitting: Internal Medicine

## 2010-01-10 ENCOUNTER — Encounter: Payer: Self-pay | Admitting: Internal Medicine

## 2010-01-12 ENCOUNTER — Telehealth: Payer: Self-pay | Admitting: *Deleted

## 2010-01-18 ENCOUNTER — Encounter: Payer: Self-pay | Admitting: Internal Medicine

## 2010-01-26 ENCOUNTER — Ambulatory Visit: Payer: Self-pay | Admitting: Internal Medicine

## 2010-02-02 ENCOUNTER — Ambulatory Visit: Payer: Self-pay | Admitting: Internal Medicine

## 2010-02-02 DIAGNOSIS — M19041 Primary osteoarthritis, right hand: Secondary | ICD-10-CM | POA: Insufficient documentation

## 2010-02-02 DIAGNOSIS — M19042 Primary osteoarthritis, left hand: Secondary | ICD-10-CM

## 2010-02-02 DIAGNOSIS — G5600 Carpal tunnel syndrome, unspecified upper limb: Secondary | ICD-10-CM | POA: Insufficient documentation

## 2010-02-02 DIAGNOSIS — K5900 Constipation, unspecified: Secondary | ICD-10-CM | POA: Insufficient documentation

## 2010-02-23 ENCOUNTER — Telehealth: Payer: Self-pay | Admitting: Internal Medicine

## 2010-03-07 ENCOUNTER — Telehealth (INDEPENDENT_AMBULATORY_CARE_PROVIDER_SITE_OTHER): Payer: Self-pay | Admitting: *Deleted

## 2010-03-08 ENCOUNTER — Encounter: Payer: Self-pay | Admitting: Internal Medicine

## 2010-03-11 ENCOUNTER — Inpatient Hospital Stay (HOSPITAL_COMMUNITY): Admission: RE | Admit: 2010-03-11 | Discharge: 2010-03-14 | Payer: Self-pay | Admitting: Orthopaedic Surgery

## 2010-03-14 ENCOUNTER — Encounter: Payer: Self-pay | Admitting: Internal Medicine

## 2010-03-17 ENCOUNTER — Telehealth: Payer: Self-pay | Admitting: Internal Medicine

## 2010-03-24 ENCOUNTER — Telehealth: Payer: Self-pay | Admitting: *Deleted

## 2010-04-06 ENCOUNTER — Telehealth: Payer: Self-pay | Admitting: *Deleted

## 2010-04-07 ENCOUNTER — Telehealth: Payer: Self-pay | Admitting: Family Medicine

## 2010-04-22 ENCOUNTER — Ambulatory Visit: Payer: Self-pay | Admitting: Internal Medicine

## 2010-04-22 DIAGNOSIS — E119 Type 2 diabetes mellitus without complications: Secondary | ICD-10-CM | POA: Insufficient documentation

## 2010-04-22 DIAGNOSIS — E1151 Type 2 diabetes mellitus with diabetic peripheral angiopathy without gangrene: Secondary | ICD-10-CM | POA: Insufficient documentation

## 2010-04-29 ENCOUNTER — Ambulatory Visit: Payer: Self-pay | Admitting: Internal Medicine

## 2010-04-29 ENCOUNTER — Telehealth: Payer: Self-pay | Admitting: Internal Medicine

## 2010-04-29 LAB — CONVERTED CEMR LAB
ALT: 13 units/L (ref 0–35)
AST: 23 units/L (ref 0–37)
Albumin: 3.8 g/dL (ref 3.5–5.2)
Alkaline Phosphatase: 75 units/L (ref 39–117)
BUN: 16 mg/dL (ref 6–23)
Basophils Absolute: 0 10*3/uL (ref 0.0–0.1)
Basophils Relative: 0.4 % (ref 0.0–3.0)
Bilirubin, Direct: 0.1 mg/dL (ref 0.0–0.3)
CO2: 31 meq/L (ref 19–32)
Calcium: 9.5 mg/dL (ref 8.4–10.5)
Chloride: 109 meq/L (ref 96–112)
Cholesterol: 162 mg/dL (ref 0–200)
Creatinine, Ser: 1.7 mg/dL — ABNORMAL HIGH (ref 0.4–1.2)
Eosinophils Absolute: 0.3 10*3/uL (ref 0.0–0.7)
Eosinophils Relative: 4.1 % (ref 0.0–5.0)
GFR calc non Af Amer: 40.52 mL/min (ref >60)
Glucose, Bld: 95 mg/dL (ref 70–99)
HCT: 33.1 % — ABNORMAL LOW (ref 36.0–46.0)
HDL: 66.4 mg/dL (ref >39.00)
Hemoglobin: 10.8 g/dL — ABNORMAL LOW (ref 12.0–15.0)
Hgb A1c MFr Bld: 5.8 % (ref 4.6–6.5)
LDL Cholesterol: 69 mg/dL (ref 0–99)
Lymphocytes Relative: 34.2 % (ref 12.0–46.0)
Lymphs Abs: 2.4 10*3/uL (ref 0.7–4.0)
MCHC: 32.6 g/dL (ref 30.0–36.0)
MCV: 92.9 fL (ref 78.0–100.0)
Monocytes Absolute: 0.6 10*3/uL (ref 0.1–1.0)
Monocytes Relative: 8.5 % (ref 3.0–12.0)
Neutro Abs: 3.6 10*3/uL (ref 1.4–7.7)
Neutrophils Relative %: 52.8 % (ref 43.0–77.0)
Platelets: 303 10*3/uL (ref 150.0–400.0)
Potassium: 4.7 meq/L (ref 3.5–5.1)
RBC: 3.56 M/uL — ABNORMAL LOW (ref 3.87–5.11)
RDW: 17 % — ABNORMAL HIGH (ref 11.5–14.6)
Sodium: 146 meq/L — ABNORMAL HIGH (ref 135–145)
Total Bilirubin: 0.2 mg/dL — ABNORMAL LOW (ref 0.3–1.2)
Total CHOL/HDL Ratio: 2
Total Protein: 7.3 g/dL (ref 6.0–8.3)
Triglycerides: 134 mg/dL (ref 0.0–149.0)
VLDL: 26.8 mg/dL (ref 0.0–40.0)
WBC: 6.9 10*3/uL (ref 4.5–10.5)

## 2010-05-02 ENCOUNTER — Telehealth: Payer: Self-pay | Admitting: *Deleted

## 2010-05-06 ENCOUNTER — Ambulatory Visit: Payer: Self-pay | Admitting: Internal Medicine

## 2010-05-16 ENCOUNTER — Telehealth: Payer: Self-pay | Admitting: Internal Medicine

## 2010-05-18 ENCOUNTER — Telehealth: Payer: Self-pay | Admitting: Internal Medicine

## 2010-05-27 ENCOUNTER — Telehealth: Payer: Self-pay | Admitting: Internal Medicine

## 2010-06-06 ENCOUNTER — Telehealth (INDEPENDENT_AMBULATORY_CARE_PROVIDER_SITE_OTHER): Payer: Self-pay | Admitting: *Deleted

## 2010-06-22 ENCOUNTER — Encounter: Payer: Self-pay | Admitting: *Deleted

## 2010-06-22 ENCOUNTER — Encounter: Payer: Self-pay | Admitting: Internal Medicine

## 2010-06-22 LAB — CONVERTED CEMR LAB
Creatinine, Ser: 1.71 mg/dL
Ferritin: 34 ng/mL
HCT: 34.3 %
Hemoglobin: 11.5 g/dL
Potassium: 4.8 meq/L
Saturation Ratios: 17 %

## 2010-06-24 ENCOUNTER — Encounter: Admission: RE | Admit: 2010-06-24 | Discharge: 2010-06-24 | Payer: Self-pay | Admitting: Nephrology

## 2010-06-28 ENCOUNTER — Ambulatory Visit: Payer: Self-pay | Admitting: Internal Medicine

## 2010-06-28 LAB — CONVERTED CEMR LAB: Blood Glucose, Fingerstick: 113

## 2010-07-07 ENCOUNTER — Encounter: Payer: Self-pay | Admitting: Internal Medicine

## 2010-07-14 ENCOUNTER — Encounter: Payer: Self-pay | Admitting: *Deleted

## 2010-07-18 ENCOUNTER — Telehealth: Payer: Self-pay | Admitting: Internal Medicine

## 2010-08-02 ENCOUNTER — Telehealth: Payer: Self-pay | Admitting: *Deleted

## 2010-08-17 ENCOUNTER — Encounter: Payer: Self-pay | Admitting: Internal Medicine

## 2010-09-20 ENCOUNTER — Encounter: Payer: Self-pay | Admitting: Internal Medicine

## 2010-09-21 ENCOUNTER — Encounter: Payer: Self-pay | Admitting: Internal Medicine

## 2010-09-26 ENCOUNTER — Telehealth: Payer: Self-pay | Admitting: Internal Medicine

## 2010-09-27 ENCOUNTER — Ambulatory Visit: Payer: Self-pay | Admitting: Internal Medicine

## 2010-09-27 DIAGNOSIS — T23209A Burn of second degree of unspecified hand, unspecified site, initial encounter: Secondary | ICD-10-CM | POA: Insufficient documentation

## 2010-09-27 LAB — CONVERTED CEMR LAB: Hgb A1c MFr Bld: 6.3 % (ref 4.6–6.5)

## 2010-09-30 ENCOUNTER — Ambulatory Visit: Payer: Self-pay | Admitting: Internal Medicine

## 2010-10-04 ENCOUNTER — Ambulatory Visit: Payer: Self-pay | Admitting: Internal Medicine

## 2010-10-25 ENCOUNTER — Telehealth: Payer: Self-pay | Admitting: *Deleted

## 2010-10-27 ENCOUNTER — Telehealth: Payer: Self-pay | Admitting: Internal Medicine

## 2010-11-07 ENCOUNTER — Telehealth: Payer: Self-pay | Admitting: Internal Medicine

## 2010-11-08 ENCOUNTER — Encounter: Payer: Self-pay | Admitting: Internal Medicine

## 2010-11-08 ENCOUNTER — Ambulatory Visit
Admission: RE | Admit: 2010-11-08 | Discharge: 2010-11-08 | Payer: Self-pay | Source: Home / Self Care | Attending: Internal Medicine | Admitting: Internal Medicine

## 2010-11-13 ENCOUNTER — Encounter: Payer: Self-pay | Admitting: Internal Medicine

## 2010-11-16 ENCOUNTER — Telehealth: Payer: Self-pay | Admitting: *Deleted

## 2010-11-20 LAB — CONVERTED CEMR LAB
ALT: 22 units/L (ref 0–35)
AST: 33 units/L (ref 0–37)
Albumin ELP: 54.6 % — ABNORMAL LOW (ref 55.8–66.1)
Albumin: 3.9 g/dL (ref 3.5–5.2)
Alkaline Phosphatase: 62 units/L (ref 39–117)
Alpha-1-Globulin: 4 % (ref 2.9–4.9)
Alpha-2-Globulin: 11.8 % (ref 7.1–11.8)
BUN: 13 mg/dL (ref 6–23)
BUN: 16 mg/dL (ref 6–23)
BUN: 17 mg/dL (ref 6–23)
BUN: 18 mg/dL (ref 6–23)
Basophils Absolute: 0 10*3/uL (ref 0.0–0.1)
Basophils Absolute: 0 10*3/uL (ref 0.0–0.1)
Basophils Absolute: 0 10*3/uL (ref 0.0–0.1)
Basophils Relative: 0.4 % (ref 0.0–3.0)
Basophils Relative: 0.5 % (ref 0.0–1.0)
Basophils Relative: 0.5 % (ref 0.0–1.0)
Beta Globulin: 5.6 % (ref 4.7–7.2)
Bilirubin, Direct: 0.1 mg/dL (ref 0.0–0.3)
Blood Glucose, Fingerstick: 101
CO2: 27 meq/L (ref 19–32)
CO2: 28 meq/L (ref 19–32)
CO2: 30 meq/L (ref 19–32)
CO2: 31 meq/L (ref 19–32)
Calcium, Total (PTH): 10.1 mg/dL (ref 8.4–10.5)
Calcium: 9.5 mg/dL (ref 8.4–10.5)
Calcium: 9.7 mg/dL (ref 8.4–10.5)
Calcium: 9.8 mg/dL (ref 8.4–10.5)
Calcium: 9.8 mg/dL (ref 8.4–10.5)
Chloride: 101 meq/L (ref 96–112)
Chloride: 102 meq/L (ref 96–112)
Chloride: 103 meq/L (ref 96–112)
Chloride: 106 meq/L (ref 96–112)
Cholesterol: 217 mg/dL — ABNORMAL HIGH (ref 0–200)
Creatinine, Ser: 1.4 mg/dL — ABNORMAL HIGH (ref 0.4–1.2)
Creatinine, Ser: 1.6 mg/dL — ABNORMAL HIGH (ref 0.4–1.2)
Creatinine, Ser: 1.6 mg/dL — ABNORMAL HIGH (ref 0.4–1.2)
Creatinine, Ser: 1.7 mg/dL — ABNORMAL HIGH (ref 0.4–1.2)
Creatinine,U: 59.4 mg/dL
Direct LDL: 146.3 mg/dL
Eosinophils Absolute: 0.5 10*3/uL (ref 0.0–0.7)
Eosinophils Absolute: 0.8 10*3/uL — ABNORMAL HIGH (ref 0.0–0.7)
Eosinophils Absolute: 1.5 10*3/uL — ABNORMAL HIGH (ref 0.0–0.7)
Eosinophils Relative: 10.1 % — ABNORMAL HIGH (ref 0.0–5.0)
Eosinophils Relative: 19.4 % — ABNORMAL HIGH (ref 0.0–5.0)
Eosinophils Relative: 8.1 % — ABNORMAL HIGH (ref 0.0–5.0)
Ferritin: 14.8 ng/mL (ref 10.0–291.0)
Folate: >20 ng/mL
GFR calc Af Amer: 43 mL/min
GFR calc Af Amer: 43 mL/min
GFR calc Af Amer: 50 mL/min
GFR calc non Af Amer: 35 mL/min
GFR calc non Af Amer: 36 mL/min
GFR calc non Af Amer: 39.73 mL/min (ref >60)
GFR calc non Af Amer: 41 mL/min
Gamma Globulin: 19.1 % — ABNORMAL HIGH (ref 11.1–18.8)
Glucose, Bld: 108 mg/dL — ABNORMAL HIGH (ref 70–99)
Glucose, Bld: 111 mg/dL — ABNORMAL HIGH (ref 70–99)
Glucose, Bld: 111 mg/dL — ABNORMAL HIGH (ref 70–99)
Glucose, Bld: 147 mg/dL — ABNORMAL HIGH (ref 70–99)
HCT: 32.7 % — ABNORMAL LOW (ref 36.0–46.0)
HCT: 33.3 % — ABNORMAL LOW (ref 36.0–46.0)
HCT: 38 % (ref 36.0–46.0)
HDL: 55.7 mg/dL (ref >39.00)
Hemoglobin: 10.8 g/dL — ABNORMAL LOW (ref 12.0–15.0)
Hemoglobin: 11.3 g/dL — ABNORMAL LOW (ref 12.0–15.0)
Hemoglobin: 12.8 g/dL (ref 12.0–15.0)
Hgb A1c MFr Bld: 6 % (ref 4.6–6.5)
Hgb A1c MFr Bld: 6.2 % — ABNORMAL HIGH (ref 4.6–6.0)
IgA: 152 mg/dL (ref 68–378)
IgG (Immunoglobin G), Serum: 1620 mg/dL — ABNORMAL HIGH (ref 694–1618)
IgM, Serum: 33 mg/dL — ABNORMAL LOW (ref 60–263)
Iron: 69 ug/dL (ref 42–145)
Lymphocytes Relative: 40.5 % (ref 12.0–46.0)
Lymphocytes Relative: 44.2 % (ref 12.0–46.0)
Lymphocytes Relative: 45.1 % (ref 12.0–46.0)
MCHC: 32.3 g/dL (ref 30.0–36.0)
MCHC: 33.6 g/dL (ref 30.0–36.0)
MCHC: 34.4 g/dL (ref 30.0–36.0)
MCV: 86.9 fL (ref 78.0–100.0)
MCV: 88.3 fL (ref 78.0–100.0)
MCV: 90.1 fL (ref 78.0–100.0)
Microalb Creat Ratio: 10.1 mg/g (ref 0.0–30.0)
Microalb, Ur: 0.6 mg/dL (ref 0.0–1.9)
Monocytes Absolute: 0.3 10*3/uL (ref 0.1–1.0)
Monocytes Absolute: 0.4 10*3/uL (ref 0.1–1.0)
Monocytes Absolute: 0.6 10*3/uL (ref 0.1–1.0)
Monocytes Relative: 4.3 % (ref 3.0–12.0)
Monocytes Relative: 6.7 % (ref 3.0–12.0)
Monocytes Relative: 8 % (ref 3.0–12.0)
Neutro Abs: 2.3 10*3/uL (ref 1.4–7.7)
Neutro Abs: 2.7 10*3/uL (ref 1.4–7.7)
Neutro Abs: 3.1 10*3/uL (ref 1.4–7.7)
Neutrophils Relative %: 30.8 % — ABNORMAL LOW (ref 43.0–77.0)
Neutrophils Relative %: 40.5 % — ABNORMAL LOW (ref 43.0–77.0)
Neutrophils Relative %: 40.9 % — ABNORMAL LOW (ref 43.0–77.0)
PTH: 38.9 pg/mL (ref 14.0–72.0)
Platelets: 261 10*3/uL (ref 150–400)
Platelets: 333 10*3/uL (ref 150–400)
Platelets: 371 10*3/uL (ref 150–400)
Potassium: 3.3 meq/L — ABNORMAL LOW (ref 3.5–5.1)
Potassium: 3.5 meq/L (ref 3.5–5.1)
Potassium: 3.9 meq/L (ref 3.5–5.1)
Potassium: 4.8 meq/L (ref 3.5–5.1)
RBC: 3.69 M/uL — ABNORMAL LOW (ref 3.87–5.11)
RBC: 3.77 M/uL — ABNORMAL LOW (ref 3.87–5.11)
RBC: 4.3 M/uL (ref 3.87–5.11)
RDW: 14.4 % (ref 11.5–14.6)
RDW: 14.9 % — ABNORMAL HIGH (ref 11.5–14.6)
RDW: 16.4 % — ABNORMAL HIGH (ref 11.5–14.6)
Saturation Ratios: 21.8 % (ref 20.0–50.0)
Sodium: 138 meq/L (ref 135–145)
Sodium: 141 meq/L (ref 135–145)
Sodium: 144 meq/L (ref 135–145)
Sodium: 144 meq/L (ref 135–145)
TSH: 2.99 microintl units/mL (ref 0.35–5.50)
Total Bilirubin: 0.4 mg/dL (ref 0.3–1.2)
Total CHOL/HDL Ratio: 4
Total Protein, Serum Electrophoresis: 7.2 g/dL (ref 6.0–8.3)
Total Protein: 7.9 g/dL (ref 6.0–8.3)
Transferrin: 225.8 mg/dL (ref >=212.0)
Triglycerides: 147 mg/dL (ref 0.0–149.0)
VLDL: 29.4 mg/dL (ref 0.0–40.0)
Vitamin B-12: 938 pg/mL — ABNORMAL HIGH (ref 211–911)
WBC: 6.6 10*3/uL (ref 4.5–10.5)
WBC: 7.5 10*3/uL (ref 4.5–10.5)
WBC: 7.6 10*3/uL (ref 4.5–10.5)

## 2010-11-21 ENCOUNTER — Telehealth: Payer: Self-pay | Admitting: *Deleted

## 2010-11-22 NOTE — Progress Notes (Signed)
Summary: refill Darvocet  Phone Note From Pharmacy   [facility]  [address] [facility] Reason for Call: Needs renewal Details for Reason: Darvocet Summary of Call: Last filled on 11/05/08 #40 Initial call taken by: Romualdo Bolk, CMA,  November 17, 2008 1:29 PM  Follow-up for Phone Call        ok to refill  ask how often she is taking med ? Follow-up by: Madelin Headings MD,  November 18, 2008 1:06 PM  Additional Follow-up for Phone Call Additional follow up Details #1::        Pt reports she takes Darvocet on an average every 12 hours, two times a day as needed for pain Additional Follow-up by: Sid Falcon LPN,  November 18, 2008 2:51 PM    Additional Follow-up for Phone Call Additional follow up Details #2::    called in 1 refill Follow-up by: Willy Eddy, LPN,  November 19, 2008 8:12 AM

## 2010-11-22 NOTE — Progress Notes (Signed)
Summary: lower back pain-please triage  Phone Note Call from Patient Call back at Home Phone [phone] Call back at [phone]   Caller: Patient Summary of Call: Pt left a voice mail saying that she is having lower back pain that is sore over her bottom rt area.  Initial call taken by: Romualdo Bolk, CMA,  March 26, 2009 7:54 AM  Follow-up for Phone Call        spoke with pt she had the cxr's done yesterday. she now has pain in the right buttocks today. she wanted to come in. she has an appt at 11:15.  Follow-up by: Army Fossa CMA,  March 26, 2009 8:47 AM
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
## 2010-11-22 NOTE — Progress Notes (Signed)
Summary: please advise  Phone Note Call from Patient Call back at Home Phone [phone] Call back at [phone]   Caller: Patient-----voice mail Summary of Call: was calling to let Dr Fabian Sharp know that her left ankle has been swollen for 3 or 4 days. Also, her tongue is swollen and sore. [facility] Initial call taken by: Warnell Forester,  November 18, 2009 12:36 PM  Follow-up for Phone Call        Spoke to pt- her left ankle is swollen for 3 days. Pt to come in this afternoon and see Dr. Caryl Never. Follow-up by: Romualdo Bolk, CMA (AAMA),  November 18, 2009 11:51 AM
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
## 2010-11-22 NOTE — Progress Notes (Signed)
Summary: concerned about her mother-waiting on chart  Phone Note Call from Patient   Caller: Argie Ramming (daughter) Call For: Dr. Fabian Sharp Summary of Call: Daughter wants Dr. Fabian Sharp to know that the pt is not following her diet or exercising or anything that she should be doing with DM.  She will attempt to bring her mother in so she can talk to you, and hopefully, you can suggest a way to help her with this. Initial call taken by: Lynann Beaver CMA,  Mar 03, 2008 8:57 AM  Follow-up for Phone Call        Per md- we can do a referral to [facility] but daughter needs to go with her. Follow-up by: Romualdo Bolk, CMA,  Mar 03, 2008 1:03 PM  Additional Follow-up for Phone Call Additional follow up Details #1::        I tried to call pt's daughter but got pt instead. I told the pt that her daughter did call saying that she was concerned about her and that we would discuss this at her office visit tomorrow. Additional Follow-up by: Romualdo Bolk, CMA,  Mar 03, 2008 4:44 PM
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
## 2010-11-22 NOTE — Consult Note (Signed)
Summary: [facility]  [facility]   Imported By: Maryln Gottron 07/27/2008 14:06:59  _____________________________________________________________________  External Attachment:    Type:   Image     Comment:   External Document
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
## 2010-11-22 NOTE — Letter (Signed)
Summary: Dr. Althea Charon office note  Dr. Althea Charon office note   Imported By: Kassie Mends 06/19/2007 09:39:42  _____________________________________________________________________  External Attachment:    Type:   Image     Comment:   Dr. Althea Charon office note

## 2010-11-22 NOTE — Progress Notes (Signed)
Summary: samples  Phone Note Call from Patient Call back at [facility] Phone [phone]   Caller: Patient Summary of Call: Pt would like samples of crestor,cymbalta, and seroquel. Initial call taken by: Romualdo Bolk, CMA Duncan Dull),  Mar 07, 2010 3:53 PM  Follow-up for Phone Call        ok if we have some Follow-up by: Madelin Headings MD,  Mar 07, 2010 11:11 PM  Additional Follow-up for Phone Call Additional follow up Details #1::        Samples up front of crestor and cymbalta. Additional Follow-up by: Romualdo Bolk, CMA Duncan Dull),  Mar 09, 2010 10:08 AM    Additional Follow-up for Phone Call Additional follow up Details #2::    Pt aware that sanple are here for pick up  Follow-up by: Kathrynn Speed CMA,  Mar 09, 2010 10:30 AM

## 2010-11-22 NOTE — Progress Notes (Signed)
Summary: refill on darvocet  Phone Note From Pharmacy   Caller: [facility]  [id number] Reason for Call: Needs renewal Details for Reason: Propoxyphene-apap 100/650 Summary of Call: last filled on 11-19-2008 #40 Initial call taken by: Romualdo Bolk, CMA (AAMA),  August 31, 2009 1:10 PM  Follow-up for Phone Call        ok to refill x 1  Follow-up by: Madelin Headings MD,  August 31, 2009 1:55 PM  Additional Follow-up for Phone Call Additional follow up Details #1::        Rx called in for darvocet. Additional Follow-up by: Romualdo Bolk, CMA (AAMA),  August 31, 2009 2:27 PM    Prescriptions: DARVOCET-N 100 100-650 MG  TABS (PROPOXYPHENE N-APAP) 1 by mouth three times a day.  #40 x 0   Entered by:   Romualdo Bolk, CMA (AAMA)   Authorized by:   Madelin Headings MD   Signed by:   Romualdo Bolk, CMA (AAMA) on 08/31/2009   Method used:   Telephoned to ...       [facility]  [address] (retail)       [address]       [place], [place]  [address]       Ph: [phone] or [phone]       Fax: [phone]   RxID:   [id number]

## 2010-11-22 NOTE — Progress Notes (Signed)
Summary: called about note from Dr. Anne Hahn  Phone Note Outgoing Call Call back at [facility] Phone [phone]   Call placed by: Romualdo Bolk, CMA,  April 22, 2008 4:10 PM Call placed to: Patient Summary of Call: Called pt to discuss what Dr. Anne Hahn said and that Dr. Fabian Sharp wants to schedule a ECHO due to her hx of syncopy and hypertension. Pt wants to go ahead with ECHO. Initial call taken by: Romualdo Bolk, CMA,  April 22, 2008 4:15 PM  Follow-up for Phone Call        Phone call completed Follow-up by: Romualdo Bolk, CMA,  April 22, 2008 4:15 PM

## 2010-11-22 NOTE — Progress Notes (Signed)
Summary: [place] in [place] and [place]   Phone Note Call from Patient Call back at Home Phone [phone]   Caller: pt vm triage Call For: [name]  Summary of Call: [place] in [place] on Saturday and fell on the excalator  Initial call taken by: Roselle Locus,  Mar 03, 2008 3:54 PM  Follow-up for Phone Call        Pt. fell in [place] and wanted Dr. Fabian Sharp to know but does not want to schedule an appt.......she will take her pain pills for minimal back pain, and call if she needs [place]. Follow-up by: Lynann Beaver CMA,  Mar 03, 2008 3:58 PM  Additional Follow-up for Phone Call Additional follow up Details #1::        Spoke to pt- she felt faintish and started to fall. Pt states her blood sugar is running 135-150 after eating. Pt to come in tomorrow to see Dr. Fabian Sharp. Additional Follow-up by: Romualdo Bolk, CMA,  Mar 03, 2008 4:07 PM         Appended Document: [place]ed in [place] and fell  9:45 appointment today Dr. Fabian Sharp.  Patient aware - left message & then talked with man who was going to get her up for appointment.

## 2010-11-22 NOTE — Progress Notes (Signed)
Summary: 2nd degree burns?  Phone Note Call from Patient   Caller: Patient Call For: Madelin Headings MD Summary of Call: Pt poured hot water on hand making coffee today......Marland Kitchenblisters intact,  Has appt tomorrow already and will have Dr. Fabian Sharp check it at that time. Initial call taken by: Lynann Beaver CMA AAMA,  September 26, 2010 2:40 PM  Follow-up for Phone Call        Per Dr. Fabian Sharp- keep it covered and don't put anything on it. Follow-up by: Romualdo Bolk, CMA (AAMA),  September 26, 2010 3:02 PM  Additional Follow-up for Phone Call Additional follow up Details #1::        Instructed to keep it clean and covered. Additional Follow-up by: Lynann Beaver CMA AAMA,  September 26, 2010 3:09 PM

## 2010-11-22 NOTE — Progress Notes (Signed)
Summary: disablity  Phone Note Call from Patient Call back at Home Phone [phone]   Caller: Patient Summary of Call: Please call about disablilty update. Initial call taken by: Romualdo Bolk, CMA,  March 31, 2008 11:13 AM  Follow-up for Phone Call        Left message to call back. Follow-up by: Romualdo Bolk, CMA,  March 31, 2008 12:49 PM  Additional Follow-up for Phone Call Additional follow up Details #1::        I spoke to pt and she just needed the last date that she was in the office. Additional Follow-up by: Romualdo Bolk, CMA,  March 31, 2008 1:35 PM

## 2010-11-22 NOTE — Progress Notes (Signed)
Summary: sugar level 201   Phone Note Call from Patient Call back at Home Phone [phone]   Caller: patient triage message Call For: [name]  Summary of Call: [facility] [address] Sugar level 201  Initial call taken by: Roselle Locus,  December 13, 2007 7:58 AM  Follow-up for Phone Call        Tell patient that the mri is normal and noevidence of masses or abnormalities.  Kidneys look ok.   Rec stop celebrex and tramadol in case it is causing symptom and kidney problems.  and avoid sugar drinks and can try restarting the metformin 1 per day .  we need to get consult with gi  as soon as possible .  also need to repeat bmp in 1 week and rov with me.after Follow-up by: Madelin Headings MD,  December 13, 2007 8:29 AM  Additional Follow-up for Phone Call Additional follow up Details #1::        Pt. given result and appts made for labs and ov with Dr. Fabian Sharp.  Marina Goodell the GI referral. Additional Follow-up by: Lynann Beaver CMA,  December 13, 2007 11:29 AM         Appended Document: sugar level 201  03/19 @ 2:30 with Dr. Leone Payor patient aware

## 2010-11-22 NOTE — Assessment & Plan Note (Signed)
Summary: 4 MONTH ROV/NJR   Vital Signs:  Patient profile:   60 year old female Menstrual status:  postmenopausal Height:      63.75 inches Weight:      163 pounds BMI:     28.30 Pulse rate:   60 / minute BP sitting:   110 / 62  (right arm) Cuff size:   regular  Vitals Entered By: Romualdo Bolk, CMA (Mar 15, 2009 12:50 PM) CC: Follow-up visit on labs, Hypertension Management CBG Result 211  Vision Screening:      Vision Comments: 10/2009 LMP (date): 10/23/2006 LMP - Character: Unsure Menarche (age onset): 11 years   days  Menstrual Status postmenopausal    History of Present Illness: Desiree [name] comes in [place] with husband for follow up of multiple medical problems .     Feet  has had some difficulty and nees ? diabetic shoes seeing podiatrist and ortho. ? if numbness has had injury and  surgery in the past. NO recent falls. BG Early DM   oknut had OJ and cereal  suage this am.  NO hypoglycemia and no se of meds. 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 readings up and down no syncope.  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 when colon was done.   Diabetes Management History:      She says that she is exercising.  Type of exercise includes: walking.  She is doing this 2 times per week.        Reported hypoglycemic symptoms include confusion.  Frequency of hypoglycemic symptoms are reported to be occasionally.  Other comments include: Forgetfullness.    Hypertension History:      She complains of dyspnea with exertion and peripheral edema, but denies chest pain, palpitations, orthopnea, PND, visual symptoms, neurologic problems, and syncope.  She notes no problems with any antihypertensive medication side effects.  Further comments include: ha's off and on, occ sob on exertion, swelling of ankles.    Positive major cardiovascular risk factors include female age 87 years old or older, diabetes, hyperlipidemia, and hypertension.  Negative major cardiovascular risk factors include non-tobacco-user status.    Preventive Care Screening  Prior Values:    Mammogram:  ASSESSMENT: Negative - BI-RADS 1^MM DIGITAL SCREENING (10/06/2008)    Colonoscopy:  Done (11/03/2004)    Last Flu Shot:  Fluvax 3+ (07/21/2008)      Preventive Screening-Counseling & Management     Alcohol drinks/day: 0     Smoking Status: quit     Year Quit: 1993     Caffeine use/day: 1     Does Patient Exercise: yes     Type of exercise: walking     Times/week: 2  Current Medications (verified): 1)  Accu-Chek Compact Test Drum  Strp (Glucose Blood) .... Use As Directed 2)  Aspir-81 81 Mg Tbec (Aspirin) .... Take 1 Tablet By Mouth Once A Day 3)  Clonazepam 1 Mg Tabs (Clonazepam) .... Take 1-2 Tablet By Mouth At Bedtime 4)  Crestor 10 Mg Tabs (Rosuvastatin Calcium) .Marland Kitchen.. 1 By Mouth Once Daily 5)  Cymbalta 30 Mg Cpep (Duloxetine Hcl) .... Take 3 Capsule By Mouth Once A Day 6)  Metformin Hcl 500 Mg Tabs (Metformin Hcl) .... Take 1 Tablet By Mouth Twice A Day  7)  Pantoprazole Sodium 40 Mg Tbec (Pantoprazole Sodium) .Marland Kitchen.. 1 By Mouth Once Daily 8)  Triamterene-Hctz 37.5-25 Mg Tabs (Triamterene-Hctz) .... 1/2  By Mouth Once Daily 9)  Seroquel 100 Mg Tabs (Quetiapine Fumarate) .Marland Kitchen.. 1 1/2 By Mouth Once Daily 10)  Tramadol Hcl 50 Mg  Tabs (Tramadol Hcl) .Marland Kitchen.. 1-2 By Mouth Tid  As Needed For Pain  Pain 11)  Lasix 20 Mg  Tabs (Furosemide) .Marland Kitchen.. 1 By Mouth Once Daily X 4-7 Days  or As Directed 12)  Darvocet-N 100 100-650 Mg  Tabs (Propoxyphene N-Apap) .Marland Kitchen.. 1 By Mouth Three Times A Day. 13)  Vitamin D 400 Unit  Tabs (Cholecalciferol) 14)  Iron 18 Mg  Cr-Tabs (Ferrous Fumarate) 15)  Colace 100 Mg Caps (Docusate Sodium) 16)  Align  Caps (Misc Intestinal Flora Regulat) 17)  Promethazine Hcl 25 Mg Tabs (Promethazine Hcl) .Marland Kitchen.. 1 By Mouth  Q4-6 Hours As Needed   Nausea and Vomiting 18)  Fluocinonide 0.05 % Oint (Fluocinonide)  Allergies (verified): 1)  ! Codeine 2)  ! Penicillin  Past History:  Care Management:    Dermatology: Danella Deis    Neurology: Anne Hahn    Psychiatry: Raquel James    Gastroenterology: Loreta Ave    Gynecology: Mazzer    Podiatry: Petrinitz    Orthopedics:Rowan  Social History:    Caffeine use/day:  1  Review of Systems  The patient denies anorexia, fever, weight loss, decreased hearing, chest pain, syncope, dyspnea on exertion, peripheral edema, prolonged cough, hemoptysis, abdominal pain, melena, hematochezia, severe indigestion/heartburn, hematuria, suspicious skin lesions, transient blindness, abnormal bleeding, enlarged lymph nodes, angioedema, and breast masses.    Physical Exam  General:  Well-developed,well-nourished,in no acute distress; alert,appropriate and cooperative throughout examination mild lipd movement s  no tremor Head:  normocephalic and atraumatic.   Eyes:  vision grossly intact.   Ears:  R ear normal and L ear normal.   Neck:  No deformities, masses, or tenderness noted. Lungs:  Normal respiratory effort, chest expands symmetrically. Lungs are clear to auscultation, no crackles or wheezes.no dullness.   Heart:  Normal rate and regular rhythm. S1 and S2 normal without gallop, murmur, click, rub or other extra sounds.no lifts.   Pulses:  pulses intact without delay   Extremities:  no clubbing cyanosis or edema  surgical scar well healed bunion right and overriding toe no ulcers or lesions no bony tenderness  ? Neurologic:  alert & oriented X3, strength normal in all extremities, and DTRs symmetrical and normal.  non focal  ? if decrease monofilament on meddle toe right  Skin:  turgor normal, color normal, no ecchymoses, no petechiae, and no purpura.   Cervical Nodes:  No lymphadenopathy noted Psych:  Oriented X3, good eye contact, and not anxious appearing.  somewhat falt speech ( no  change ) cooperative and cognition seems nl.  Diabetes Management Exam:    Foot Exam (with socks and/or shoes not present):       Sensory-other: see phys exam        Inspection:          Left foot: normal          Right foot: normal       Nails:          Left foot: normal          Right foot: normal    Eye Exam:       Eye Exam done elsewhere          Date: 10/23/2008  Results: glucoma          Done by: [place]   Impression & Recommendations:  Problem # 1:  DIABETES MELLITUS, TYPE II, CONTROLLED (ICD-250.00) Assessment New now numbers cw dm in stead of pre diabetes .   did have pnuemovax   am concerned about elevated today .   reviewed diet with her and husband regarding this   Her updated medication list for this problem includes:    Aspir-81 81 Mg Tbec (Aspirin) .Marland Kitchen... Take 1 tablet by mouth once a day    Metformin Hcl 500 Mg Tabs (Metformin hcl) .Marland Kitchen... Take 1 tablet by mouth twice a day  Labs Reviewed: Creat: 1.7 (03/08/2009)     Last Eye Exam: glucoma (10/23/2008) Reviewed HgBA1c results: 6.5 (03/08/2009)  6.2 (10/27/2008)  Problem # 2:  HYPERTENSION (ICD-401.9)  concern about  bp readings if indeed labile .     Her updated medication list for this problem includes:    Triamterene-hctz 37.5-25 Mg Tabs (Triamterene-hctz) .Marland Kitchen... 1/2  by mouth once daily    Lasix 20 Mg Tabs (Furosemide) .Marland Kitchen... 1 by mouth once daily x 4-7 days  or as directed  BP today: 110/62 Prior BP: 120/78 (12/09/2008)  Prior 10 Yr Risk Heart Disease: 8 % (11/04/2007)  Labs Reviewed: K+: 3.5 (03/08/2009) Creat: : 1.7 (03/08/2009)   Chol: 116 (03/08/2009)   HDL: 30.50 (03/08/2009)   LDL: 50 (03/08/2009)   TG: 177.0 (03/08/2009)  Problem # 3:  UNSPECIFIED ANEMIA (ICD-285.9)  Her updated medication list for this problem includes:    Iron 18 Mg Cr-tabs (Ferrous fumarate)  Hgb: 11.4 (03/08/2009)   Hct: 34.0 (03/08/2009)   Platelets: 304.0 (03/08/2009) RBC: 3.65 (03/08/2009)   RDW: 15.1  (03/08/2009)   WBC: 8.0 (03/08/2009) MCV: 93.0 (03/08/2009)   MCHC: 33.4 (03/08/2009) Ferritin: 14.8 (04/14/2008) Iron: 69 (04/14/2008)   % Sat: 21.8 (04/14/2008) B12: 938 (04/14/2008)   Folate: > 20.0 ng/mL (04/14/2008)   TSH: 1.68 (03/08/2009)  Problem # 4:  RENAL INSUFFICIENCY MILD CR 1.6 (ICD-588.9)  Orders: TLB-Microalbumin/Creat Ratio, Urine (82043-MALB)  Problem # 5:  HYPERLIPIDEMIA (ICD-272.4)  Her updated medication list for this problem includes:    Crestor 10 Mg Tabs (Rosuvastatin calcium) .Marland Kitchen... 1 by mouth once daily  Labs Reviewed: SGOT: 33 (12/04/2007)   SGPT: 35 (12/04/2007)  Prior 10 Yr Risk Heart Disease: 8 % (11/04/2007)   HDL:30.50 (03/08/2009), 24.4 (06/10/2007)  LDL:50 (03/08/2009), 44 (06/10/2007)  Chol:116 (03/08/2009), 89 (06/10/2007)  Trig:177.0 (03/08/2009), 103 (06/10/2007)  Problem # 6:  PAIN IN JOINT, ANKLE AND FOOT (ICD-719.47) Assessment: Improved multiple problem since  past buninonectomy right  old fx left  and small bunion on left .    mild ote override on left .   she still has sensation of  monofilament but some decrease podiatrist to oder appropriate shoes and inserts   but call if needed about this.  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   Problem # 9:  ? of AFFECTIVE DISORDER (ICD-301.10) Assessment: Comment Only under psych care   Complete Medication List: 1)  Accu-chek Compact Test Drum Strp (Glucose blood) .... Use as directed 2)  Aspir-81 81 Mg Tbec (Aspirin) .... Take 1 tablet by mouth once a day 3)  Clonazepam 1 Mg Tabs (Clonazepam) .... Take 1-2 tablet by mouth at bedtime 4)  Crestor 10 Mg Tabs (Rosuvastatin calcium) .Marland Kitchen.. 1 by mouth  once daily 5)  Cymbalta 30 Mg Cpep (Duloxetine hcl) .... Take 3 capsule by mouth once a day 6)  Metformin Hcl 500 Mg Tabs (Metformin hcl) .... Take 1 tablet by  mouth twice a day 7)  Pantoprazole Sodium 40 Mg Tbec (Pantoprazole sodium) .Marland Kitchen.. 1 by mouth once daily 8)  Triamterene-hctz 37.5-25 Mg Tabs (Triamterene-hctz) .... 1/2  by mouth once daily 9)  Seroquel 100 Mg Tabs (Quetiapine fumarate) .Marland Kitchen.. 1 1/2 by mouth once daily 10)  Tramadol Hcl 50 Mg Tabs (Tramadol hcl) .Marland Kitchen.. 1-2 by mouth tid  as needed for pain  pain 11)  Lasix 20 Mg Tabs (Furosemide) .Marland Kitchen.. 1 by mouth once daily x 4-7 days  or as directed 12)  Darvocet-n 100 100-650 Mg Tabs (Propoxyphene n-apap) .Marland Kitchen.. 1 by mouth three times a day. 13)  Vitamin D 400 Unit Tabs (Cholecalciferol) 14)  Iron 18 Mg Cr-tabs (Ferrous fumarate) 15)  Colace 100 Mg Caps (Docusate sodium) 16)  Align Caps (Misc intestinal flora regulat) 17)  Promethazine Hcl 25 Mg Tabs (Promethazine hcl) .Marland Kitchen.. 1 by mouth q4-6 hours as needed   nausea and vomiting 18)  Fluocinonide 0.05 % Oint (Fluocinonide)  Other Orders: Venipuncture (16109) Sedimentation Rate, non-automated (60454)  Diabetes Management Assessment/Plan:      Her blood pressure goal is < 140/90.    Hypertension Assessment/Plan:      The patient's hypertensive risk group is category C: Target organ damage and/or diabetes.  Her calculated 10 year risk of coronary heart disease is 15 %.  Today's blood pressure is 110/62.  Her blood pressure goal is < 140/90.  Patient Instructions: 1)  You will be informed of lab results when available.  2)  Check Blood pressure readings  2 x per day  for the next 2 weeks and then call or fax these results in.   then I will decide on adding  a low dose BP medication.  3)  Avoid  juices and seet drinks  for now  4)  Continue  Crestor . 5)  We may have to stop the metformin if kidney function not getting better  and   try other medications .  6)  We may have to get opinoin for the kidney doctor about best med to use for  your blood pressure if needed.     Laboratory Results   Blood Tests     CBG Random:: 211mg /dL SED rate: 18  mm/hr  Comments: Joanne Chars CMA  Mar 15, 2009 2:53 PM        Immunization History:  Pneumovax Immunization History:    Pneumovax:  historical (02/25/2009)

## 2010-11-22 NOTE — Assessment & Plan Note (Signed)
Summary: ROA- SMM   Vital Signs:  Patient Profile:   60 Years Old Female Weight:      165 pounds Pulse rate:   80 / minute BP sitting:   110 / 60  (left arm) Cuff size:   regular  Vitals Entered By: Romualdo Bolk, CMA (November 04, 2007 3:12 PM)             CBG Result 85 Comments Pt's last meal was at noon. ..................................................................Marland KitchenRomualdo Bolk, CMA  November 04, 2007 3:18 PM      Chief Complaint:  Follow up .  History of Present Illness: ANYELY CUNNING is here for a follow up on labs. Pt is going to have the pharmacy call us if she needs any refills. BP ok BG up and down . Higfher in middle of the day.  low 64  at one point with swimmy headed and damp.  Recent light headed when getting up from sitting position.  Has lost weight because of eating less.    MS:  No new meds except ultram for ortho pain as needed.   Can exercise up to 1/2 mile per day.  Psych: no med change  Hypertension History:      She denies headache, chest pain, palpitations, dyspnea with exertion, orthopnea, peripheral edema, and side effects from treatment.  She notes no problems with any antihypertensive medication side effects.        Positive major cardiovascular risk factors include female age 68 years old or older, hyperlipidemia, and hypertension.  Negative major cardiovascular risk factors include non-tobacco-user status.     Current Allergies (reviewed today): ! CODEINE  Past Medical History:    Reviewed history from 06/17/2007 and no changes required:       Hyperlipidemia       GERD       G2P1       Depresssion dx       Colonic polyps, hx of 2006  Past Surgical History:    Back Fusion 2002    Rt Toe Bunion    Knee Surgery   Family History:    Family History Hypertension father     CAD Fa     Family History Diabetes 1st degree relative parent     throat cancer     Review of Systems  The patient denies anorexia, fever,  prolonged cough, and abdominal pain.         leg cramps at night at times.   left ankle sore with exercise.    Physical Exam  General:     alert, well-developed, well-nourished, and well-hydrated.   Head:     normocephalic and atraumatic.   Neck:     No deformities, masses, or tenderness noted. Lungs:     Normal respiratory effort, chest expands symmetrically. Lungs are clear to auscultation, no crackles or wheezes. Heart:     Normal rate and regular rhythm. S1 and S2 normal without gallop, murmur, click, rub or other extra sounds. Abdomen:     Bowel sounds positive,abdomen soft and non-tender without masses, organomegaly or hernias noted. Pulses:     intact without bruits Extremities:     no cce  mildly tender left ankle Neurologic:     sensation intact to light touch.   Skin:     turgor normal and color normal.   Cervical Nodes:     No lymphadenopathy noted Psych:     good eye contact and not anxious appearing.  Serial Vital Signs/Assessments:  Time      Position  BP       Pulse  Resp  Temp     By                     118/78                         Madelin Headings MD                     118/78                         Madelin Headings MD  Comments: right arm sitting.  By: Madelin Headings MD     Impression & Recommendations:  Problem # 1:  HYPERTENSION (ICD-401.9) controlled  ? if gets low with below symptom  Her updated medication list for this problem includes:    Triamterene-hctz 37.5-25 Mg Tabs (Triamterene-hctz) .Marland Kitchen... 1/2  by mouth once daily  BP today: 110/60 Prior BP: 120/70 (06/20/2007)  10 Yr Risk Heart Disease: 8 %  Labs Reviewed: Creat: 1.8 (10/28/2007) Chol: 89 (06/10/2007)   HDL: 24.4 (06/10/2007)   LDL: 44 (06/10/2007)   TG: 103 (06/10/2007)   Problem # 2:  FASTING HYPERGLYCEMIA (ICD-790.6) controlled   Problem # 3:  GERD (ICD-530.81) Assessment: Unchanged  Her updated medication list for this problem includes:    Pantoprazole Sodium  40 Mg Tbec (Pantoprazole sodium) .Marland Kitchen... 1 by mouth once daily   Problem # 4:  FAMILY HISTORY DIABETES 1ST DEGREE RELATIVE (ICD-V18.0) Assessment: Comment Only  Problem # 5:  POSTURAL LIGHTHEADEDNESS (ICD-780.4) Assessment: New may be related to lower bp but needs to check bg then  because of ehr weight loss she may need adjustment in meds.Demonstrated  .   Complete Medication List: 1)  Accu-chek Compact Test Drum Strp (Glucose blood) 2)  Aspir-81 81 Mg Tbec (Aspirin) .... Take 1 tablet by mouth once a day 3)  Celebrex 200 Mg Caps (Celecoxib) .Marland Kitchen.. 1 by mouth once daily 4)  Clonazepam 1 Mg Tabs (Clonazepam) .... Take 1-2 tablet by mouth at bedtime 5)  Crestor 10 Mg Tabs (Rosuvastatin calcium) .Marland Kitchen.. 1 by mouth once daily 6)  Cymbalta 30 Mg Cpep (Duloxetine hcl) .... Take 3 capsule by mouth once a day 7)  Metformin Hcl 500 Mg Tabs (Metformin hcl) .... Take 1 tablet by mouth twice a day 8)  Pantoprazole Sodium 40 Mg Tbec (Pantoprazole sodium) .Marland Kitchen.. 1 by mouth once daily 9)  Triamterene-hctz 37.5-25 Mg Tabs (Triamterene-hctz) .... 1/2  by mouth once daily 10)  Zyprexa 10 Mg Tabs (Olanzapine) .Marland Kitchen.. 1 by mouth once daily 11)  Tramadol Hcl 50 Mg Tabs (Tramadol hcl) .Marland Kitchen.. 1 by mouth two times a day as needed  Other Orders: Capillary Blood Glucose (16109) Fingerstick (60454)  Hypertension Assessment/Plan:      The patient's hypertensive risk group is category B: At least one risk factor (excluding diabetes) with no target organ damage.  Her calculated 10 year risk of coronary heart disease is 8 %.  Today's blood pressure is 110/60.  Her blood pressure goal is < 140/90.   Patient Instructions: 1)  check blood sugar readings and blood pressure and pulse readings when feels lightheaded.   2)  Please schedule a follow-up appointment in 1-2  month. 3)  BMP prior to visit, ICD-9: 401.9 4)  CBC w/ Diff  prior to visit, ICD-9: 780.9 5)  Vitamin d level    ]

## 2010-11-22 NOTE — Progress Notes (Signed)
Summary: samples  Phone Note Call from Patient Call back at [phone]   Caller: vm Summary of Call: Samples Crestor & medicine that strats with Pan.   Initial call taken by: Rudy Jew, RN,  April 06, 2010 12:38 PM  Follow-up for Phone Call        please take care of this Follow-up by: Nelwyn Salisbury MD,  April 07, 2010 8:18 AM  Additional Follow-up for Phone Call Additional follow up Details #1::        Pt aware and samples of crestor given to pt. Additional Follow-up by: Romualdo Bolk, CMA (AAMA),  April 07, 2010 8:34 AM
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
## 2010-11-22 NOTE — Progress Notes (Signed)
Summary: wt loss, no appetite  Phone Note Call from Patient Call back at [phone]   Caller: vm Call For: panosh Summary of Call: Saw psych today.  Since Feb 30 lb. weight loss.  No appetite.  She recommended I get labs, see you about what's going on to cause. Initial call taken by: Rudy Jew, RN,  June 18, 2008 12:33 PM  Follow-up for Phone Call        she has had multiple labs sin the spring eval for weight loss .  has anemia mild and mild renalinsufficiency  .   last labs were 6/09   suggest before next visit we get repeat cbc, bmp, intact pth , esr, c xray .   Consider furthe referral based on that .   Follow-up by: Madelin Headings MD,  June 19, 2008 7:18 PM  Additional Follow-up for Phone Call Additional follow up Details #1::        Carollee Herter I need the diagnostic codes for the labs Dr Fabian Sharp listed above b4 I call the patient  .sign    Additional Follow-up for Phone Call Additional follow up Details #2::    Dx codes 588.9 and 285.9 Romualdo Bolk, CMA  June 23, 2008 5:23 PM appt made Roselle Locus  June 24, 2008 8:14 AM

## 2010-11-22 NOTE — Progress Notes (Signed)
Summary: Anemia   Phone Note From Other Clinic Call back at [facility] Phone [phone]   Caller: Yolanda @Dr  [name] Call For: Dr Leone Payor Reason for Call: Schedule Patient Appt Summary of Call: Pt needs appointment sooner than Nov for anemia. If we can call pt back with an appointment please. Initial call taken by: Leanor Kail [facility],  July 23, 2008 9:45 AM  Follow-up for Phone Call        pt scheduled with Willette Cluster for 07-27-08 1:30 Follow-up by: Darcey Nora RN,  July 23, 2008 10:35 AM

## 2010-11-22 NOTE — Letter (Signed)
Summary: Generic Letter   at [facility]  [address] [place], [place] [address]   Phone: [phone]  Fax: [phone]    12/29/2009  To Whom It May Concern,  [name], DOB: 1951-04-06, is a patient of Dr. Fabian Sharp. Her daughter Karl Luke is her caregiver who provides transportation to her appointments, fixes her meals, gets her groceries and picks up her medications at her pharmacy.           Sincerely,    Neta Mends. Fabian Sharp, MD

## 2010-11-22 NOTE — Consult Note (Signed)
Summary: [facility]  [facility]   Imported By: Lanelle Bal 05/01/2008 14:25:45  _____________________________________________________________________  External Attachment:    Type:   Image     Comment:   External Document

## 2010-11-22 NOTE — Miscellaneous (Signed)
Summary: labs done by [facility]   Clinical Lists Changes  Observations: Added new observation of CREATININE: 1.71 mg/dL (19/14/7829 56:21) Added new observation of K SERUM: 4.8 meq/L (06/22/2010 14:27) Added new observation of FERRITIN: 34 ng/mL (06/22/2010 14:26) Added new observation of IRON SATUR %: 17 % (06/22/2010 14:26) Added new observation of HCT: 34.3 % (06/22/2010 14:26) Added new observation of HGB: 11.5 g/dL (30/86/5784 69:62)      -  Date:  06/22/2010    K 4.8    CREAT 1.71

## 2010-11-22 NOTE — Progress Notes (Signed)
Summary: refill on tramadol  Phone Note From Pharmacy   Caller: [facility]  [address]  [phone]* Reason for Call: Needs renewal Details for Reason: Tramadol Initial call taken by: Romualdo Bolk, CMA,  January 15, 2008 1:53 PM  Follow-up for Phone Call        Per md- ok x 1. Sent back via fax. Follow-up by: Romualdo Bolk, CMA,  January 15, 2008 1:54 PM    New/Updated Medications: TRAMADOL HCL 50 MG  TABS (TRAMADOL HCL) 1-2 by mouth every 4-6 hours as needed pain   Prescriptions: TRAMADOL HCL 50 MG  TABS (TRAMADOL HCL) 1-2 by mouth every 4-6 hours as needed pain  #60 x 0   Entered by:   Romualdo Bolk, CMA   Authorized by:   Madelin Headings MD   Signed by:   Romualdo Bolk, CMA on 01/15/2008   Method used:   Telephoned to ...       [facility]  [address]  [id number]       [address]       [place], [place]  [address]       Ph: [phone] or [phone]       Fax: [phone]   RxID:   [id number]

## 2010-11-22 NOTE — Progress Notes (Signed)
Summary: strept throat  Phone Note Call from Patient   Caller: Patient Call For: Madelin Headings MD Summary of Call: Pt calls stating she has coughed up specks of blood with her sputum today.  Advised this is not uncommon. Initial call taken by: Lynann Beaver CMA,  September 28, 2009 3:34 PM  Follow-up for Phone Call        no change in therapy  at this time. Follow-up by: Madelin Headings MD,  September 28, 2009 5:12 PM

## 2010-11-22 NOTE — Progress Notes (Signed)
Summary: prod cough  Phone Note Call from Patient   Caller: Patient Call For: Madelin Headings MD Summary of Call: c/o sick x few days, no thermometer, lips are parched. C/o productive cough and head congestion- green phlegm. cell [phone] or [phone] Initial call taken by: Raechel Ache, RN,  December 10, 2009 3:48 PM  Follow-up for Phone Call        Per Dr. Fabian Sharp- Sat clinic Follow-up by: Romualdo Bolk, CMA Duncan Dull),  December 10, 2009 4:33 PM  Additional Follow-up for Phone Call Additional follow up Details #1::        Pt aware and appt made. Additional Follow-up by: Romualdo Bolk, CMA (AAMA),  December 10, 2009 4:44 PM

## 2010-11-22 NOTE — Consult Note (Signed)
Summary: [facility]   Imported By: Maryln Gottron 07/20/2010 09:23:02  _____________________________________________________________________  External Attachment:    Type:   Image     Comment:   External Document

## 2010-11-22 NOTE — Progress Notes (Signed)
Summary: fever and chills  Phone Note Call from Patient   Summary of Call: [phone] Pt has stopped diarrhea and vomiting, but is continues to have chills and low grade fever.  99. Is concerned about this? Is eating soups and liquids, and BSs are staying within normal ranges. Wanted Dr. Fabian Sharp to know about the chills. Called back & left vm that she does have diarrhea, it's still there. Rudy Jew, RN  December 14, 2008 12:27 PM  Initial call taken by: Lynann Beaver CMA,  December 14, 2008 9:57 AM  Follow-up for Phone Call        Per Dr. Fabian Sharp- chills could  be from the diarrhea. Once diarrhea goes away this should too. Call if fever over 102. or above. Otherwise, can give it more time. Follow-up by: Romualdo Bolk, CMA,  December 14, 2008 1:22 PM  Additional Follow-up for Phone Call Additional follow up Details #1::        Pt given Dr. Rosezella [place] recommendations. Additional Follow-up by: Lynann Beaver CMA,  December 14, 2008 4:49 PM
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
## 2010-11-22 NOTE — Progress Notes (Signed)
Summary: FYI update from Dentist  Phone Note Call from Patient   Caller: Patient Call For: Madelin Headings MD Reason for Call: Lab or Test Results Summary of Call: The dentist did not find any problems. Still having swelling under   right ear and chin. [phone] Initial call taken by: Lynann Beaver CMA,  October 25, 2009 3:50 PM  Follow-up for Phone Call        if continuing pain and swelling and not dental then rec ent referral Follow-up by: Madelin Headings MD,  October 25, 2009 5:51 PM  Additional Follow-up for Phone Call Additional follow up Details #1::        Pt notified, and orders sent to [facility]. Additional Follow-up by: Lynann Beaver CMA,  October 26, 2009 10:15 AM
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
## 2010-11-22 NOTE — Progress Notes (Signed)
Summary: pt has ? about being on metformin  Phone Note Call from Patient Call back at Home Phone [phone]   Caller: Patient Summary of Call: Pt called wanting to know why labs say she is non-diabetic but is still on metformin. I tried to call pt back but pt was not at home. I have left a message for pt to call the office back so we can discuss this. (See ov note from 11/09/08) Initial call taken by: Romualdo Bolk, CMA (AAMA),  May 28, 2009 12:48 PM  Follow-up for Phone Call        Pt's ?'s answered. Follow-up by: Romualdo Bolk, CMA (AAMA),  May 28, 2009 3:05 PM

## 2010-11-22 NOTE — Progress Notes (Signed)
Summary: sore throat  Phone Note Call from Patient   Caller: Patient Call For: Madelin Headings MD Summary of Call: Pt calls stating she has a sore throat, fever (101) and chills.  Left message to call back for appt time. [phone] Initial call taken by: Lynann Beaver CMA,  September 27, 2009 2:04 PM  Follow-up for Phone Call        Phone Call Completed Follow-up by: Rudy Jew, RN,  September 27, 2009 2:23 PM

## 2010-11-22 NOTE — Letter (Signed)
Summary: [facility]  [facility]   Imported By: Maryln Gottron 01/14/2010 09:04:05  _____________________________________________________________________  External Attachment:    Type:   Image     Comment:   External Document

## 2010-11-22 NOTE — Letter (Signed)
Summary: [facility]  [facility]   Imported By: Maryln Gottron 11/08/2009 08:33:42  _____________________________________________________________________  External Attachment:    Type:   Image     Comment:   External Document

## 2010-11-22 NOTE — Progress Notes (Signed)
Summary: Pt confused about 2 appts coming up  Phone Note Call from Patient Call back at Home Phone [phone]   Caller: Patient Call For: [name] Summary of Call: Pt called asking why she has 2 appointments coming up, one with a Dr Moishe Spice and one with Dr Cecille Po? Pt requesting a call back @ [phone] Initial call taken by: Sid Falcon LPN,  December 19, 2007 3:05 PM  Follow-up for Phone Call        Pt aware of both appts. Follow-up by: Romualdo Bolk, CMA,  December 20, 2007 11:40 AM

## 2010-11-22 NOTE — Progress Notes (Signed)
Summary: CHANGE GI CARE   Phone Note Outgoing Call Call back at [facility] Phone [phone]   Call placed by: Harlow Mares CMA [facility]),  June 06, 2010 11:40 AM Call placed to: Patient Summary of Call: called pt and advised her she is due for her colonoscopy but she is going to Dr. Loreta Ave now. I will have yesi note it in IDX Initial call taken by: Harlow Mares CMA [facility]),  June 06, 2010 11:40 AM
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
## 2010-11-22 NOTE — Op Note (Signed)
Summary: EGD    Upper Endoscopy  Procedure date:  01/21/2008  Findings:      HIATAL HERNIA (SLIDING)

## 2010-11-22 NOTE — Progress Notes (Signed)
Summary: BP med refill  Phone Note Call from Patient Call back at [phone]   Caller: live Call For: panosh Summary of Call: Has called twice today to say that her drugstore needs prior authorizaqtion for her BP med.  Told her both time s to check with drugstore.  Last time checked refills specifically & Carollee Herter has done refill today for #15/2rf.  Called Jelesa back & she'll check with the pharmacy. Initial call taken by: Rudy Jew, RN,  August 13, 2009 3:56 PM     Appended Document: BP med refill Called the pharmacy and spoke to the pharmacist. They never recieved an electronic request from [place]. I went ahead and called in rx. I called and left a message for pt that it was at her pharmacy.
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
## 2010-11-22 NOTE — Procedures (Signed)
Summary: EGD Report/[facility]  EGD Report/[facility]   Imported By: Maryln Gottron 08/13/2008 16:01:14  _____________________________________________________________________  External Attachment:    Type:   Image     Comment:   External Document

## 2010-11-22 NOTE — Letter (Signed)
Summary: patient history  patient history   Imported By: Kassie Mends 06/19/2007 09:37:01  _____________________________________________________________________  External Attachment:    Type:   Image     Comment:   patient history

## 2010-11-22 NOTE — Letter (Signed)
Summary: Colonoscopy Letter  [facility]  [address] [place], [place] [address]   Phone: [phone]  Fax: [phone]      October 14, 2009 MRN: [id number]   Desiree [name] [address] [place], [place]  [address]   Dear [name],   According to your medical record, it is time for you to schedule a Colonoscopy. The American Cancer Society recommends this procedure as a method to detect early colon cancer. Patients with a family history of colon cancer, or a personal history of colon polyps or inflammatory bowel disease are at increased risk.  This letter has beeen generated based on the recommendations made at the time of your procedure. If you feel that in your particular situation this may no longer apply, please contact our office.  Please call our office at [phone] to schedule this appointment or to update your records at your earliest convenience.  Thank you for cooperating with [place] to provide you with the very best care possible.   Sincerely,  Iva Boop, M.D.  [facility] [phone]

## 2010-11-22 NOTE — Progress Notes (Signed)
Summary: contrast?  Phone Note Call from Patient   Caller: Patient Call For: Dr. Fabian Sharp Summary of Call: Pt. would like to know if she needs contrast for her next CT scan next month. [phone] Initial call taken by: Lynann Beaver CMA,  December 17, 2007 12:40 PM  Follow-up for Phone Call        This is something that the Dr needs to answer Follow-up by: Florentina Addison,  December 17, 2007 2:28 PM  Additional Follow-up for Phone Call Additional follow up Details #1::        I spoke to pt she got confused about her appt. She thought she was going to have a procedure done instead of seeing Leone Payor. Pt is now aware of all of her appts. Additional Follow-up by: Romualdo Bolk, CMA,  December 17, 2007 3:46 PM
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
## 2010-11-22 NOTE — Progress Notes (Signed)
  Phone Note Call from Patient   Caller: Patient Call For: Madelin Headings MD Summary of Call: Lyrica is 75 mg........FYI. Initial call taken by: Lynann Beaver CMA,  November 04, 2009 11:38 AM  Follow-up for Phone Call        thanks  Follow-up by: Madelin Headings MD,  November 04, 2009 12:05 PM    New/Updated Medications: LYRICA 75 MG CAPS (PREGABALIN) 2 two times a day

## 2010-11-22 NOTE — Progress Notes (Signed)
Summary: Kidney fx off  Phone Note Outgoing Call   Call placed by: Romualdo Bolk, CMA,  December 11, 2007 9:21 AM Call placed to: Patient Summary of Call: Pt's kidney function still off. We need to do a MRI instead. DX nausea, vomiting, wt loss and abdominal pain.  Initial call taken by: Romualdo Bolk, CMA,  December 11, 2007 9:22 AM  Follow-up for Phone Call        Pt was told by Dr. Fabian Sharp to stop metformin and follow up if sugars get high and that we will schedule a MRI for this week. Follow-up by: Romualdo Bolk, CMA,  December 11, 2007 9:23 AM

## 2010-11-22 NOTE — Progress Notes (Signed)
Summary: Note saying that her daugher is her caregiver  Phone Note Call from Patient Call back at Home Phone [phone] Call back at [phone]   Caller: Patient Summary of Call: Pt called saying that she needs a note saying that her daughter is her caregiver. Pt needs the note to go the [employer]. Pt's daughter's name is  Karl Luke. All she needs is a note saying that her daugher Karl Luke is her caregiver. Initial call taken by: Romualdo Bolk, CMA Duncan Dull),  December 24, 2009 4:49 PM  Follow-up for Phone Call        Per Dr. Fabian Sharp- ask pt what she does so we have documentation for the letter. Follow-up by: Romualdo Bolk, CMA Duncan Dull),  December 24, 2009 5:33 PM  Additional Follow-up for Phone Call Additional follow up Details #1::        Spoke to Pt and her daughter brings her to md appt's, get's her rx's at the pharmacy, fixes her her meals and gets her groceries.  Additional Follow-up by: Romualdo Bolk, CMA Duncan Dull),  December 27, 2009 12:57 PM    Additional Follow-up for Phone Call Additional follow up Details #2::    ok to do letter . Follow-up by: Madelin Headings MD,  December 27, 2009 5:44 PM  Additional Follow-up for Phone Call Additional follow up Details #3:: Details for Additional Follow-up Action Taken: Left message on machine that letter is ready to pick up. Additional Follow-up by: Romualdo Bolk, CMA (AAMA),  January 03, 2010 11:34 AM

## 2010-11-22 NOTE — Progress Notes (Signed)
Summary: rx for shoes  Phone Note Call from Patient Call back at Home Phone [phone] Call back at [phone]   Caller: Patient Summary of Call: pt is calling because she spoke with Dr Turner Daniels and because he has not treated her for DM he can write the rx for the shoes she is requesting.  Any suggestions? Initial call taken by: Kern Reap CMA,  Feb 24, 2009 1:28 PM  Follow-up for Phone Call        Pt is calling back asking for Podiatry referral .  Dr. Turner Daniels could not prescribe the special shoes she is requesting. Follow-up by: Lynann Beaver CMA,  Feb 25, 2009 11:27 AM  Additional Follow-up for Phone Call Additional follow up Details #1::        Per Dr. Dava Najjar to referral. Pt aware and order sent to [facility] Additional Follow-up by: Romualdo Bolk, CMA,  Mar 01, 2009 1:16 PM
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
## 2010-11-22 NOTE — Progress Notes (Signed)
Summary: need diabetic shoes and labwork  Phone Note Call from Patient Call back at Home Phone [phone]   Caller: Patient Call For: Madelin Headings MD Summary of Call: [facility] nurse called her over the weekend and ask to have her kidney function and thyroid function checked. Blood sugar in range. Need copy of lab report. Need recommendation for foot Dr for diabetic shoes. Already seeing a foot Dr. Had surgery on foot. She could recommend shoes to Dr. Turner Daniels. Please call me.  Initial call taken by: Lucy Antigua,  Feb 22, 2009 11:30 AM  Follow-up for Phone Call        pt had labs in January and is scheduled I think for later in theis month for follow up .   Ok to send copies of last labs done . Majke sure the repeat tests oare on the  lab schedule .  Her specialist should prescribe any special shoes  needed. Follow-up by: Madelin Headings MD,  Feb 22, 2009 1:21 PM  Additional Follow-up for Phone Call Additional follow up Details #1::        I called pt and told her she needs to contact her specialist and have them  prescribe the shoes needed.  Additional Follow-up by: Lucy Antigua,  Feb 22, 2009 2:17 PM

## 2010-11-22 NOTE — Progress Notes (Signed)
Summary: refill on darvocet  Phone Note From Pharmacy   Caller: [facility]  [address] Reason for Call: Needs renewal Details for Reason: propoyyphen-apap Summary of Call: Last filled on 08/12/08 #40 Initial call taken by: Romualdo Bolk, CMA,  September 04, 2008 3:48 PM  Follow-up for Phone Call        ok x 2 per Dr. Fabian Sharp. Called in rx. Follow-up by: Romualdo Bolk, CMA,  September 04, 2008 5:15 PM      Prescriptions: DARVOCET-N 100 100-650 MG  TABS (PROPOXYPHENE N-APAP) 1 by mouth three times a day.  #40 x 1   Entered by:   Romualdo Bolk, CMA   Authorized by:   Madelin Headings MD   Signed by:   Romualdo Bolk, CMA on 09/04/2008   Method used:   Telephoned to ...       [facility]  [address] (retail)       [address]       [place], [place]  [address]       Ph: [phone] or [phone]       Fax: [phone]   RxID:   [id number]

## 2010-11-22 NOTE — Progress Notes (Signed)
Summary: BS results.  Phone Note Call from Patient   Caller: Patient Call For: Madelin Headings MD Summary of Call: Pt is reporting BS results x 3 days.  Random draws.......140, 100, 179 [id number] Initial call taken by: Lynann Beaver CMA,  September 15, 2009 1:33 PM  Follow-up for Phone Call        no change in plan. will see her at follow up in JAn Follow-up by: Madelin Headings MD,  September 22, 2009 8:46 AM  Additional Follow-up for Phone Call Additional follow up Details #1::        Pt aware. Additional Follow-up by: Romualdo Bolk, CMA (AAMA),  September 22, 2009 9:01 AM

## 2010-11-22 NOTE — Progress Notes (Signed)
Summary: gave asa fath a copy of her last ov note and labs.  Phone Note Call from Patient   Caller: Patient Danamarie Leeson Call For: medical records Action Taken: Phone Call Completed Summary of Call: need  to pick up a copy of her last ov note and labs. Initial call taken by: Drue Stager,  December 11, 2007 12:03 PM  Follow-up for Phone Call        copied  last ov note and labs for Narda Shropshire to pick up. Follow-up by: Drue Stager,  December 11, 2007 12:05 PM

## 2010-11-22 NOTE — Assessment & Plan Note (Signed)
Summary: H1N1 vaccine with Shannon/nn   Nurse Visit    Prior Medications: ACCU-CHEK COMPACT TEST DRUM  STRP (GLUCOSE BLOOD)  ASPIR-81 81 MG TBEC (ASPIRIN) Take 1 tablet by mouth once a day CLONAZEPAM 1 MG TABS (CLONAZEPAM) Take 1-2 tablet by mouth at bedtime CRESTOR 10 MG TABS (ROSUVASTATIN CALCIUM) 1 by mouth once daily CYMBALTA 30 MG CPEP (DULOXETINE HCL) Take 3 capsule by mouth once a day METFORMIN HCL 500 MG TABS (METFORMIN HCL) Take 1 tablet by mouth twice a day PANTOPRAZOLE SODIUM 40 MG TBEC (PANTOPRAZOLE SODIUM) 1 by mouth once daily TRIAMTERENE-HCTZ 37.5-25 MG TABS (TRIAMTERENE-HCTZ) 1/2  by mouth once daily SEROQUEL 100 MG TABS (QUETIAPINE FUMARATE) 1 1/2 by mouth once daily TRAMADOL HCL 50 MG  TABS (TRAMADOL HCL) 1-2 by mouth tid  as needed for pain  pain LASIX 20 MG  TABS (FUROSEMIDE) 1 by mouth once daily x 4-7 days  or as directed DARVOCET-N 100 100-650 MG  TABS (PROPOXYPHENE N-APAP) 1 by mouth three times a day. VITAMIN D 400 UNIT  TABS (CHOLECALCIFEROL)  IRON 18 MG  CR-TABS (FERROUS FUMARATE)  COLACE 100 MG CAPS (DOCUSATE SODIUM)  ALIGN  CAPS (MISC INTESTINAL FLORA REGULAT)  Current Allergies: ! CODEINE ! PENICILLIN   Flu Vaccine Consent Questions    Do you have a history of severe allergic reactions to this vaccine? no    Any prior history of allergic reactions to egg and/or gelatin? no    Do you have a sensitivity to the preservative Thimersol? no    Do you have a past history of Guillan-Barre Syndrome? no    Do you currently have an acute febrile illness? no    Have you ever had a severe reaction to latex? no    Vaccine information given and explained to patient? yes    Are you currently pregnant? no  H1N1 # 1    Vaccine Type: H1N1 vaccine G code    Site: left deltoid    Mfr: novartis    Dose: 0.5 ml    Route: IM    Given by: Romualdo Bolk, CMA    Exp. Date: 02/12/2009    Lot #: [id number]   Orders Added: 1)  H1N1 vaccine G code [G9142] 2)   Influenza A (H1N1) adm  fee Medicare/Non Medicare Shadi.[email or web address]    ]

## 2010-11-22 NOTE — Progress Notes (Signed)
Summary: results   Phone Note Call from Patient Call back at Home Phone [phone]   Caller: Patient Call For: Leone Payor Reason for Call: Talk to Nurse Summary of Call: Patient wants EGD results from March '09 Initial call taken by: Tawni Levy,  December 27, 2009 8:56 AM  Follow-up for Phone Call        LM for pt to call.  Ashok Cordia RN  December 27, 2009 11:23 AM  Questions answered about EGD report from 12/2007.  Patient  asked to call back if she had any further problems or questions Follow-up by: Darcey Nora RN, CGRN,  December 27, 2009 2:04 PM
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
## 2010-11-22 NOTE — Progress Notes (Signed)
Summary: bilateral leg swelling  Phone Note Call from Patient   Caller: Patient Call For: Madelin Headings MD Summary of Call: Pt calls complaining of swelling of hands and legs is worse.  No sob.  Swelling is from feet to hips ...Marland KitchenMarland KitchenMarland Kitchennot painful.  Is taking Methocarbamol 500 mg. and Oxycodone from Orthopedist after back surgery Friday. Advised to call the surgeon and inform them of these symptoms, and we will ask Dr. Fabian Sharp for her recommendations. Initial call taken by: Lynann Beaver CMA,  Mar 17, 2010 10:51 AM  Follow-up for Phone Call        Per Dr. Fabian Sharp- Pt needs to see Surgeon. Follow-up by: Romualdo Bolk, CMA Duncan Dull),  Mar 17, 2010 11:37 AM  Additional Follow-up for Phone Call Additional follow up Details #1::        Pt given Dr. Rosezella Florida recomendations. Additional Follow-up by: Lynann Beaver CMA,  Mar 17, 2010 12:23 PM

## 2010-11-22 NOTE — Procedures (Signed)
Summary: Upper Endoscopy report/[facility]  Upper Endoscopy report/[facility]   Imported By: Maryln Gottron 07/13/2009 14:04:09  _____________________________________________________________________  External Attachment:    Type:   Image     Comment:   External Document

## 2010-11-22 NOTE — Progress Notes (Signed)
Summary: Cymbalta samples request  Phone Note Call from Patient Call back at Home Phone [phone]   Caller: Patient Call For: Madelin Headings MD Summary of Call: Pt VM requesting samples of Cymbalta, she is running out and will be getting ,ore on Thursday.  3 bottles of 7 capsules provided Initial call taken by: Sid Falcon LPN,  May 16, 2010 3:14 PM

## 2010-11-22 NOTE — Assessment & Plan Note (Signed)
Summary: productive cough/dm   Vital Signs:  Patient profile:   60 year old female Menstrual status:  postmenopausal Weight:      144 pounds Temp:     99.4 degrees F oral BP sitting:   142 / 74  (left arm) Cuff size:   regular  Vitals Entered By: Alfred Levins, CMA (June 07, 2009 4:02 PM) CC: loss of appetite, cough, sneezing, n/v, hoarse   History of Present Illness: Here with her daughter with a long list of complaints which seem to have been going on for some time. she normally sees Dr. Fabian Sharp. Her main symptoms lately seem to be stuffy headm sinus pressure, PND, chest congestion, and coughing up brown sputum. No fever. She had a normal CXR in June. She had some normal labs here recently as well. Her other complaits include, weakness, dizziness, weight loss, numbness in the arms and legs, trouble swallowing, etc. She sees Dr. Loreta Ave for GI issues. She has a long hx of GERD and is on Pantoprazole, but apparently she has never had upper endoscopy.   Allergies: 1)  ! Codeine 2)  ! Penicillin  Past History:  Past Medical History: Reviewed history from 05/21/2009 and no changes required. 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 Danella Maiers  Past Surgical History: Reviewed history from 12/09/2008 and no changes required. Back Fusion 2002 Rt Toe Bunion Knee Surgery  juvara osteomy  Review of Systems  The patient denies fever, weight gain, vision loss, decreased hearing, hoarseness, chest pain, syncope, peripheral edema, hemoptysis, abdominal pain, melena, hematochezia, severe indigestion/heartburn, hematuria, incontinence, genital sores, suspicious skin lesions, transient blindness, difficulty walking, depression, unusual weight change, abnormal bleeding, enlarged lymph nodes, and  angioedema.    Physical Exam  General:  Well-developed,well-nourished,in no acute distress; alert,appropriate and cooperative throughout examination Head:  Normocephalic and atraumatic without obvious abnormalities. No apparent alopecia or balding. Eyes:  No corneal or conjunctival inflammation noted. EOMI. Perrla. Funduscopic exam benign, without hemorrhages, exudates or papilledema. Vision grossly normal. Ears:  External ear exam shows no significant lesions or deformities.  Otoscopic examination reveals clear canals, tympanic membranes are intact bilaterally without bulging, retraction, inflammation or discharge. Hearing is grossly normal bilaterally. Nose:  External nasal examination shows no deformity or inflammation. Nasal mucosa are pink and moist without lesions or exudates. Mouth:  Oral mucosa and oropharynx without lesions or exudates.  Teeth in good repair. Neck:  No deformities, masses, or tenderness noted. Lungs:  Normal respiratory effort, chest expands symmetrically. Lungs are clear to auscultation, no crackles or wheezes. Heart:  Normal rate and regular rhythm. S1 and S2 normal without gallop, murmur, click, rub or other extra sounds. Abdomen:  Bowel sounds positive,abdomen soft and non-tender without masses, organomegaly or hernias noted.   Impression & Recommendations:  Problem # 1:  ACUTE SINUSITIS, UNSPECIFIED (ICD-461.9)  Her updated medication list for this problem includes:    Zithromax Z-pak 250 Mg Tabs (Azithromycin) .Marland Kitchen... As directed  Problem # 2:  GERD (ICD-530.81)  Her updated medication list for this problem includes:    Pantoprazole Sodium 40 Mg Tbec (Pantoprazole sodium) .Marland Kitchen... 1 by mouth once daily  Complete Medication List: 1)  Accu-chek Compact Test Drum Strp (Glucose blood) .... Check once a day 2)  Aspir-81 81 Mg Tbec (Aspirin) .Marland KitchenMarland KitchenMarland Kitchen  Take 1 tablet by mouth once a day 3)  Trazodone Hcl 50 Mg Tabs (Trazodone hcl) .Marland Kitchen.. 1 by mouth at bedtime 4)  Crestor  10 Mg Tabs (Rosuvastatin calcium) .Marland Kitchen.. 1 by mouth once daily 5)  Cymbalta 30 Mg Cpep (Duloxetine hcl) .... Take 3 capsule by mouth once a day 6)  Metformin Hcl 500 Mg Tabs (Metformin hcl) .... Take 1 tablet by mouth twice a day 7)  Pantoprazole Sodium 40 Mg Tbec (Pantoprazole sodium) .Marland Kitchen.. 1 by mouth once daily 8)  Triamterene-hctz 37.5-25 Mg Tabs (Triamterene-hctz) .... 1/2  by mouth once daily 9)  Seroquel 100 Mg Tabs (Quetiapine fumarate) .Marland Kitchen.. 1 1/2 by mouth once daily 10)  Tramadol Hcl 50 Mg Tabs (Tramadol hcl) .Marland Kitchen.. 1-2 by mouth tid  as needed for pain  pain 11)  Lasix 20 Mg Tabs (Furosemide) .Marland Kitchen.. 1 by mouth once daily x 4-7 days  or as directed 12)  Darvocet-n 100 100-650 Mg Tabs (Propoxyphene n-apap) .Marland Kitchen.. 1 by mouth three times a day. 13)  Vitamin D 400 Unit Tabs (Cholecalciferol) 14)  Iron 18 Mg Cr-tabs (Ferrous fumarate) 15)  Colace 100 Mg Caps (Docusate sodium) 16)  Align Caps (Misc intestinal flora regulat) 17)  Promethazine Hcl 25 Mg Tabs (Promethazine hcl) .Marland Kitchen.. 1 by mouth q4-6 hours as needed   nausea and vomiting 18)  Fluocinonide 0.05 % Oint (Fluocinonide) 19)  Vitamin E 600 Unit Caps (Vitamin e) 20)  Vitamin B-12 100 Mcg Tabs (Cyanocobalamin) 21)  Zithromax Z-pak 250 Mg Tabs (Azithromycin) .... As directed  Patient Instructions: 1)  I told her that her problems ar etoo complex and too numerous for me to deal with them all today. She seems to have an upper respiratory infection, so we will try a Zpack. She seems to have upper GI problems and may need an EGD, so I suggested she see Dr. Loreta Ave again soon. See Dr. Fabian Sharp soon.  Prescriptions: ZITHROMAX Z-PAK 250 MG TABS (AZITHROMYCIN) as directed  #1 x 0   Entered and Authorized by:   Nelwyn Salisbury MD   Signed by:   Nelwyn Salisbury MD on 06/07/2009   Method used:   Electron[facility] to        [facility]  [address] (retail)       [address]       [place], [place]  [address]       Ph: [phone] or [phone]       Fax:  [phone]   RxID:   [id number]

## 2010-11-22 NOTE — Progress Notes (Signed)
Summary: SOEMTHING FOR PAIN   Phone Note Call from Patient Call back at Home Phone [phone]   Caller: PT VM TRIAGE Call For: [facility]  Summary of Call: WANTS SOMETHING FOR PAIN [facility] [address]  Initial call taken by: Roselle Locus,  February 11, 2008 3:58 PM  Follow-up for Phone Call        10:30 appointment set. Follow-up by: Rudy Jew, RN,  February 12, 2008 8:28 AM

## 2010-11-22 NOTE — Progress Notes (Signed)
Summary: Requested fax,Instructions on med  Phone Note Call from Patient Call back at [facility] Phone [phone]   Caller: Patient Call For: Dr Fabian Sharp Summary of Call: pt a little confused on medication. Question: how many tabs to take of the Metformin HCL 500 mg, is it 2 a day or 2 tablets twice a day? Pls call to let her know [facility] [address]    Initial call taken by: Shan Levans,  July 31, 2007 11:44 AM  Follow-up for Phone Call        Pt called, she thought she was to take Metformin one pill 2 times a day, however [facility] order is to two pills 2 times a day.  [employer] contacted, they will fax RX dated 11/16/06. ..................................................................Marland KitchenSid Falcon LPN  July 31, 2007 3:37 PM [facility], informed pt we will have Dr. Fabian Sharp review and clafify her question next week.  Until then, she will continue taking one pill twice a day.  Follow-up by: Sid Falcon LPN,  July 31, 2007 3:54 PM  Additional Follow-up for Phone Call Additional follow up Details #1::        2 tabs two times a day. Faxed over to pharmacy. Additional Follow-up by: Romualdo Bolk, CMA,  August 05, 2007 7:55 AM

## 2010-11-22 NOTE — Letter (Signed)
Summary: [facility]  [facility]   Imported By: Maryln Gottron 07/08/2009 12:33:50  _____________________________________________________________________  External Attachment:    Type:   Image     Comment:   External Document
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
## 2010-11-22 NOTE — Progress Notes (Signed)
Summary: med needs authorization  Phone Note Call from Patient Call back at Home Phone [phone]   Caller: Patient Call For: panosh Reason for Call: Refill Medication, Lab or Test Results Summary of Call: refill propoxyphen n100 requires authorization Initial call taken by: Alfred Levins, CMA,  November 04, 2008 12:43 PM  Follow-up for Phone Call        refill x 1  Follow-up by: Madelin Headings MD,  November 04, 2008 5:11 PM  Additional Follow-up for Phone Call Additional follow up Details #1::        Rx called in. Additional Follow-up by: Romualdo Bolk, CMA,  November 05, 2008 8:54 AM    New/Updated Medications: DARVOCET-N 100 100-650 MG  TABS (PROPOXYPHENE N-APAP) 1 by mouth three times a day.   Prescriptions: DARVOCET-N 100 100-650 MG  TABS (PROPOXYPHENE N-APAP) 1 by mouth three times a day.  #40 x 0   Entered by:   Romualdo Bolk, CMA   Authorized by:   Madelin Headings MD   Signed by:   Romualdo Bolk, CMA on 11/05/2008   Method used:   Telephoned to ...       [facility]  [address] (retail)       [address]       [place], [place]  [address]       Ph: [phone] or [phone]       Fax: [phone]   RxID:   [id number]
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
## 2010-11-22 NOTE — Progress Notes (Signed)
Summary: bp reading  Phone Note Call from Patient Call back at [facility] Phone [phone]   Caller: Patient Summary of Call: Pt bp readings: Pt states that her bp is going up and down. 5/26- 90/63 at pm, 98/67 in pm (unsure of time); 5/27- 106/59 pm, 105/55pm; 5/28-115/58pm; 5/29- 90/69 in pm, 97/53 in pm; 5/30 2pm 99/56, 90/61 7:55pm; 5/31- 103/61 at 5:25pm, 114/65 at 914pm; 6/1- 101/63 at 1016pm; 6/2- 99/58 109pm, 95/56 7pm; 6/3-122/67 1pm, 100/56 507pm; 6/4 & 6/5 didn't take; 6/6-98/68 at 528pm; 6/7-103/60 at 848pm; 6/8- 116/66 at 1005pm; 6/9- 112/63 at 153pm, 114/70 at 630pm; 6/10-101/59 238pm, 111/62 816pm; 6/11- 92/59 at 126pm, 107/61 at 937pm; 6/13-101/58 at 730pm. Initial call taken by: Romualdo Bolk, CMA,  April 05, 2009 2:21 PM  Follow-up for Phone Call        it is possible that extra pain medication could also  be involved but stopp the diuretic and get some more readings  in the next week call the readings in again. Follow-up by: Madelin Headings MD,  April 07, 2009 10:11 AM  Additional Follow-up for Phone Call Additional follow up Details #1::        Pt aware. Additional Follow-up by: Romualdo Bolk, CMA,  April 07, 2009 10:18 AM

## 2010-11-22 NOTE — Progress Notes (Signed)
Summary: blood little nose  Phone Note Call from Patient Call back at Home Phone [phone]   Caller: live Call For: Desiree [name] Summary of Call: At ov mentioned that when she blows nose gets a little blood from both sides of nose & Dr. Demetrius Charity didn't give her an answer.  Should she be concerned.  Not on blood thinner, is on 81 mg ASA.  Is just a lettle blood.  No fever, no sinus congestion, some stuffiness, has not been using the RX NS lately.  Told her to run humidifier, use saline NS once or at most twice daily if needed before she blows nose gently before she tries to get booger out.  Probably not of concern.  She wants Dr. Demetrius Charity to answer.  Initial call taken by: Rudy Jew, RN,  November 05, 2009 4:24 PM  Follow-up for Phone Call        Per Dr. Fabian Sharp- agrees with suggest. Pt just had good ENT exam. Follow-up by: Romualdo Bolk, CMA Duncan Dull),  November 05, 2009 4:39 PM  Additional Follow-up for Phone Call Additional follow up Details #1::        Phone Call Completed Additional Follow-up by: Rudy Jew, RN,  November 05, 2009 5:00 PM

## 2010-11-22 NOTE — Procedures (Signed)
Summary: egd  egd   Imported By: Kassie Mends 01/27/2008 10:57:26  _____________________________________________________________________  External Attachment:    Type:   Image     Comment:   egd

## 2010-11-22 NOTE — Progress Notes (Signed)
Summary: chest congestion  Phone Note Call from Patient   Caller: Patient Call For: Madelin Headings MD Summary of Call: Pt is complaining of congestion in chest with hoarseness and wants RX to help clear the congestion.  No fever or other complaints. [phone] Initial call taken by: Lynann Beaver CMA,  April 29, 2010 3:46 PM  Follow-up for Phone Call        needs saturday clinic if feels   need intervention  . Follow-up by: Madelin Headings MD,  April 29, 2010 4:57 PM  Additional Follow-up for Phone Call Additional follow up Details #1::        spoke with pt - if signs bad enough that she want abx- encouraged saturday clinic - if not that bad - otc mucinex two times a day with full glass water. KIK Additional Follow-up by: Duard Brady LPN,  April 30, 4539 5:18 PM

## 2010-11-22 NOTE — Letter (Signed)
Summary: Records from [facility] 2003 - 200  Records from [facility] 2003 - 2004   Imported By: Maryln Gottron 05/26/2009 12:22:39  _____________________________________________________________________  External Attachment:    Type:   Image     Comment:   External Document

## 2010-11-22 NOTE — Letter (Signed)
Summary: [facility]   Imported By: Maryln Gottron 08/26/2010 11:22:02  _____________________________________________________________________  External Attachment:    Type:   Image     Comment:   External Document

## 2010-11-22 NOTE — Progress Notes (Signed)
Summary: numbness in hands and feet.  Phone Note Call from Patient Call back at [facility] Phone [phone]   Caller: Patient Summary of Call: Pt wants to make sure that she needs to talk her blood pressure meds. Pt is having numbness in her hands and feet. I told pt to follow up with Neuro about the tingling in her hands and feet then to follow up with Dr. Loreta Ave for the endo. Then to schedule a follow up with Dr. Fabian Sharp after all of this has been taken care of. But if she has any problems in the meantime to give [place] a call or if she needs help in getting in with neuro then call us back and we will help with this as well. Initial call taken by: Romualdo Bolk, CMA Duncan Dull),  June 08, 2009 5:05 PM  Follow-up for Phone Call        please see last OV  rec  please make sure she has an rov early october or as needed.   Follow-up by: Madelin Headings MD,  June 09, 2009 5:43 PM  Additional Follow-up for Phone Call Additional follow up Details #1::        Pt aware and appt made. Additional Follow-up by: Romualdo Bolk, CMA (AAMA),  June 10, 2009 10:46 AM

## 2010-11-22 NOTE — Progress Notes (Signed)
Summary: discuss meds  Phone Note Call from Patient Call back at Home Phone [phone]   Caller: Patient Call For: [facility] Summary of Call: PT IS UNSURE OF HER RX  METFORMIN PT WAS SEEN TODAY PLS CALL  Initial call taken by: Shan Levans,  June 17, 2007 5:10 PM  Follow-up for Phone Call        Per md-no change in metformin. Needs office visit to look at moles. Follow-up by: Romualdo Bolk, CMA,  June 17, 2007 5:22 PM

## 2010-11-22 NOTE — Progress Notes (Signed)
Summary: Desiree [name], please sign   Phone Note Call from Patient Call back at [facility] Phone [phone]   Caller: Patient Call For: [place] Summary of Call: [facility] [address]  MATFORMIN 2 TWICE A DAY OR 1 TWICE A DAY  PATIENT NOT SURE WHICH TO TAKE  Initial call taken by: Roselle Locus,  August 06, 2007 8:04 AM  Follow-up for Phone Call        Called pt, she told me Desiree [name] already called her and told her to take 2 Metformin 2 times a day. ..................................................................Marland KitchenSid Falcon LPN  August 06, 2007 9:02 AM   Additional Follow-up for Phone Call Additional follow up Details #1::        Pt aware. Additional Follow-up by: Romualdo Bolk, CMA,  August 06, 2007 9:44 AM

## 2010-11-22 NOTE — Progress Notes (Signed)
Summary: new refill request  Phone Note Call from Patient Call back at Home Phone [phone] Call back at [phone]   Caller: Patient----voice mail----- Summary of Call: needs new rx for Accu Chek Aviva. [facility] [address] Initial call taken by: Warnell Forester,  March 24, 2010 4:49 PM  Follow-up for Phone Call        Spoke to pt's daughter and she needs the lancets Rx sent to pharmacy Follow-up by: Romualdo Bolk, CMA Duncan Dull),  March 25, 2010 9:23 AM    New/Updated Medications: ACCU-CHEK MULTICLIX LANCETS  MISC (LANCETS) use as directed Prescriptions: ACCU-CHEK MULTICLIX LANCETS  MISC (LANCETS) use as directed  #100 x 3   Entered by:   Romualdo Bolk, CMA (AAMA)   Authorized by:   Madelin Headings MD   Signed by:   Romualdo Bolk, CMA (AAMA) on 03/25/2010   Method used:   [facility]        [facility] [address] [phone][facility] (retail)       [address]       [place], [place]  [address]       Ph: [phone] or [phone]       Fax: [phone]   RxID:   [id number]   Appended Document: new refill request Zaire called to check on this.  Said daughter talked to Kinsey, not her.  Verified Rx with her.

## 2010-11-22 NOTE — Progress Notes (Signed)
Summary: refill Tramadol?  Phone Note Call from Patient Call back at Home Phone [phone]   Caller: patient live Call For: [name]  Summary of Call: tramadol 50mg  take 1 or 2 tablets per day for pain.  [facility] [address]  Dr Althea Charon rxed this and  drug store said they have not heard from his office.   Initial call taken by: Roselle Locus,  January 13, 2008 3:23 PM  Follow-up for Phone Call        Left message to call back- pt needs call the office that rx this to see what is going on. Follow-up by: Romualdo Bolk, CMA,  January 14, 2008 8:19 AM  Additional Follow-up for Phone Call Additional follow up Details #1::        Pt. would like to speak to [facility] and is returning er call.  Please call [phone] Additional Follow-up by: Lynann Beaver CMA,  January 14, 2008 9:24 AM    Additional Follow-up for Phone Call Additional follow up Details #2::    Pt called back and was given Jemila Camille's message.  Pt was instructed to contact Dr Maebelle Munroe office and request refills, or may call [facility] [place] to have them send refill request electronically.  Pt voiced her understanding. Follow-up by: Sid Falcon LPN,  January 14, 2008 3:48 PM

## 2010-11-22 NOTE — Consult Note (Signed)
Summary: Consultation Report  Consultation Report   Imported By: Kassie Mends 04/07/2008 10:04:30  _____________________________________________________________________  External Attachment:    Type:   Image     Comment:   Dr Daphine Deutscher note

## 2010-11-22 NOTE — Progress Notes (Signed)
Summary: Seroquel and Darvocette  Phone Note Call from Patient   Caller: Patient Call For: Dr Fabian Sharp Summary of Call: Pt called to remind Dr. Fabian Sharp to call her refill of Darvocette and her Seroquel dosage is 150 mg. [facility]  [phone] Initial call taken by: Lynann Beaver CMA,  July 21, 2008 3:25 PM  Follow-up for Phone Call        Darvocet was called in this am. Seroquel noted in med list. Follow-up by: Romualdo Bolk, CMA,  July 21, 2008 3:43 PM    New/Updated Medications: SEROQUEL XR 150 MG XR24H-TAB (QUETIAPINE FUMARATE) 1 by mouth once daily

## 2010-11-22 NOTE — Progress Notes (Signed)
Summary: pecans ?  Phone Note Call from Patient Call back at [phone]   Summary of Call: Are preshelled pecans okay? Called her & asked if she's allergic to nuts.  She said no.  Asked her why she's calling about this.  She said the kitchen lady where she eats wants to know if she can eat pecans since she's diabetic.  Told her yes in small quantities, that nuts are good for [place].  Rudy Jew, RN  Feb 23, 2010 3:11 PM  Initial call taken by: Rudy Jew, RN,  Feb 23, 2010 3:07 PM

## 2010-11-22 NOTE — Consult Note (Signed)
Summary: [facility] note  [facility] note   Imported By: Kassie Mends 05/20/2008 09:39:32  _____________________________________________________________________  External Attachment:    Type:   Image     Comment:   [facility] note

## 2010-11-22 NOTE — Progress Notes (Signed)
Summary: blood sugar 155  Phone Note Call from Patient Call back at Home Phone [phone]   Caller: patient triage message Call For: [name]  Summary of Call: blood sugar 155.  should she be concerned  no metformin today  Initial call taken by: Roselle Locus,  December 12, 2007 2:41 PM  Follow-up for Phone Call        Called pt back and she reports she does not feel very well due to a cold.  Her appetite is a little off, so she is drinking more fluids.  Had cranberry juice this AM, now sipping on a Pepsi (not sugar free).  Discussed her sugar/carb intake.   Pt was told to not take her Metformin yesterday, so she did not take it for 2 days now.  Pt questioning how many days is she to not take this med? Follow-up by: Sid Falcon LPN,  December 12, 2007 3:17 PM  Additional Follow-up for Phone Call Additional follow up Details #1::        See triage message from Dr. Fabian Sharp. Additional Follow-up by: Romualdo Bolk, CMA,  December 13, 2007 8:42 AM

## 2010-11-22 NOTE — Progress Notes (Signed)
Summary: referral to [facility]  Phone Note Call from Patient   Caller: Patient Call For: Dr Fabian Sharp Summary of Call: Pt. would like a referral to [facility]. [phone] Initial call taken by: Lynann Beaver CMA,  May 11, 2008 2:40 PM  Follow-up for Phone Call        Per md ok to do. Referral sent. Follow-up by: Romualdo Bolk, CMA,  May 12, 2008 8:28 AM

## 2010-11-22 NOTE — Progress Notes (Signed)
Summary: low BS on Saturday  Phone Note Call from Patient   Caller: Patient Call For: Madelin Headings MD Summary of Call: wanted to let you know BS dropped to 62 on Saturday, felt light-headed; ate something and felt better. FBS today 121. Initial call taken by: Raechel Ache, RN,  December 06, 2009 10:02 AM

## 2010-11-22 NOTE — Progress Notes (Signed)
Summary: BS 113 no dizziness, feeling fine  Phone Note Outgoing Call   Call placed by: Romualdo Bolk, CMA (AAMA),  November 29, 2009 4:51 PM Summary of Call: Spoke to pt and she is feeling better. Pt states that she thought it was what she ate. Pt's BS was 113. Han't ate all day. Pt was eatting as we spoke on the phone. Pt BP was okay to but couldn't remember the reading. Pt hasn't been feeling dizzy or lightheaded. Pt is wanting to know if she can cancel the appt tomorrow. I told pt to keep it for now but that I would check with Dr. Fabian Sharp and see what she says. Initial call taken by: Romualdo Bolk, CMA (AAMA),  November 29, 2009 4:56 PM  Follow-up for Phone Call        Per Dr. Fabian Sharp- As long as pt is doing okay we can cancel appt. Spoke with pt and she is doing fine. Appt cancelled. Follow-up by: Romualdo Bolk, CMA (AAMA),  November 30, 2009 8:32 AM

## 2010-11-22 NOTE — Letter (Signed)
Summary: [facility]   Imported By: Maryln Gottron 09/23/2010 15:41:05  _____________________________________________________________________  External Attachment:    Type:   Image     Comment:   External Document

## 2010-11-22 NOTE — Progress Notes (Signed)
Summary: BP readings  Phone Note Call from Patient   Caller: Patient Call For: Madelin Headings MD Summary of Call: BP readings  [phone] 04/08/2009...Marland KitchenMarland KitchenMarland Kitchen6:30 am:  99/68, 8:35 pm: 103/65 04/07/2009...Marland KitchenMarland KitchenMarland Kitchen4:50 pm:  103/58, 8:30 pm: 106/61 04/09/2009......8:12 am:  82/59 04/10/2009......3:45 pm:  110/70,  9:37 pm: 123/78 04/11/2009...Marland KitchenMarland KitchenMarland Kitchen10:43 pm  122/72 04/13/2009......8:39   am  130/87 04/14/2009...Marland KitchenMarland KitchenMarland Kitchen7:02  am   126/69 04/15/2009...Marland KitchenMarland KitchenMarland Kitchen6:42  am   108/69 Initial call taken by: Lynann Beaver CMA,  April 16, 2009 1:50 PM  Follow-up for Phone Call        stay off  diuretic BP med  and keep appt  next week . keep monitoring Bp readings and bring in your cuff to that visit Follow-up by: Madelin Headings MD,  April 16, 2009 4:10 PM  Additional Follow-up for Phone Call Additional follow up Details #1::        Patient instructed per Dr. Rosezella [place] instructions. Additional Follow-up by: Darra Lis RMA,  April 16, 2009 4:41 PM

## 2010-11-22 NOTE — Progress Notes (Signed)
Summary: rx  Phone Note Call from Patient   Caller: pt vm triage Call For: panosh  Summary of Call: got propoxyphen apap 100/650mg teb at the hospital and would like rx for this med  Initial call taken by: Roselle Locus,  May 20, 2008 1:01 PM  Follow-up for Phone Call        Per Dr. Fabian Sharp ok to do #40 1 three times a day as needed. Tell pt to be careful not to fall. Left message to call back. Rx called in. Follow-up by: Romualdo Bolk, CMA,  May 20, 2008 5:11 PM    New/Updated Medications: DARVOCET-N 100 100-650 MG  TABS (PROPOXYPHENE N-APAP) 1 by mouth three times a day.   Prescriptions: DARVOCET-N 100 100-650 MG  TABS (PROPOXYPHENE N-APAP) 1 by mouth three times a day.  #40 x 0   Entered by:   Romualdo Bolk, CMA   Authorized by:   Madelin Headings MD   Signed by:   Romualdo Bolk, CMA on 05/20/2008   Method used:   Telephoned to ...       [facility] [address] [id number]*       [address]       [place], [place]  [address]       Ph: [phone]       Fax: [phone]   RxID:   [id number]

## 2010-11-22 NOTE — Progress Notes (Signed)
Summary: when does she need to take her metformin  Phone Note Call from Patient Call back at [facility] Phone [phone]   Caller: Patient Summary of Call: Pt wants to know if she should take her metformin after or before she eats? Initial call taken by: Romualdo Bolk, CMA,  October 08, 2008 12:14 PM    disc at [facility]

## 2010-11-22 NOTE — Progress Notes (Signed)
Summary: BP Medication  Phone Note Call from Patient Call back at Home Phone [phone]   Caller: Patient Summary of Call: Received the message in regards to bp medication, it has been refilled.  However, the bottle stated 0 refills left.  Spoke w/ pharmacist and said it should be alright to have refills available and they would fax a request over.  [facility] [place] [phone] Initial call taken by: Trixie Dredge,  August 24, 2009 2:10 PM  Follow-up for Phone Call        Spoke to pt and she spoke with a female at the pharmacy. They told her that they would call us when she needs more refills. I spoke to [place] at the pharmacy and she does 3 months of refills at the pharmacy. Pt's daughter aware. Follow-up by: Romualdo Bolk, CMA (AAMA),  August 24, 2009 4:12 PM

## 2010-11-22 NOTE — Progress Notes (Signed)
Summary: rt sided chest pains  Phone Note Call from Patient Call back at Home Phone [phone] Call back at [phone]   Caller: Patient Summary of Call: Pt called wanting to know if she needs to stay on current meds. She is also having left hand pain due to carpal tunnel and sharp pains shooting thru the rt side off and on for 1 month. Pt is not having any sob. No abd. pain and no numbness or tingling down her arms and legs. Pt is also not having burning in chest pain.   Told pt to stay on the same meds and to call ortho about the carpel tunnel. I would ask md what she would like to do about the chest pains. Initial call taken by: Romualdo Bolk, CMA,  March 24, 2009 3:47 PM  Follow-up for Phone Call        Per Dr. Fabian Sharp- Get a cxr it maybe chest wall pain but hard to tell. If gets new sob, fever come in for a ov. Follow-up by: Romualdo Bolk, CMA,  March 24, 2009 5:06 PM  Additional Follow-up for Phone Call Additional follow up Details #1::        Left a message for patient on voice mail. Additional Follow-up by: Darra Lis RMA,  March 25, 2009 8:29 AM    Additional Follow-up for Phone Call Additional follow up Details #2::    Patient went to get chest xray yesterday. Follow-up by: Darra Lis RMA,  March 26, 2009 8:18 AM

## 2010-11-22 NOTE — Progress Notes (Signed)
Summary: wants prenatal vitamins  Phone Note Call from Patient   Complaint: Abdominal Pain Summary of Call: Patient requesting a script for prenatal vitamins to give her an appetite. [facility] Patient can be reached at [phone]. Initial call taken by: Darra Lis RMA,  July 29, 2009 4:14 PM  Follow-up for Phone Call        prenatal vitamins will not increase her appetite . she can use otc womens vitamins .   I would hope her appetite better after treattment for her esophageal infection.     Follow-up by: Madelin Headings MD,  July 30, 2009 1:00 PM  Additional Follow-up for Phone Call Additional follow up Details #1::        LMTOCB Romualdo Bolk, CMA (AAMA)  July 30, 2009 1:39 PM Called again & LM on Triage line, same message. Rudy Jew, RN  July 30, 2009 2:46 PM  Pt aware of above message. Additional Follow-up by: Romualdo Bolk, CMA (AAMA),  August 02, 2009 5:18 PM

## 2010-11-22 NOTE — Progress Notes (Signed)
Summary: Strept symptoms continue.  Phone Note Call from Patient   Caller: Patient Call For: Madelin Headings MD Reason for Call: Acute Illness Complaint: Cough/Sore throat, Nausea/Vomiting/Diarrhea Summary of Call: Pt calls to let Dr. Fabian Sharp  know she is still having chills and sweats, stays cold on one side of body.  Productive cough.  Temp still 99.9, and is itching on face, neck and scalp.  One episode of N & V Saturday.  Would like advice, and to know how long she is contagious with Strept throat? [phone] Initial call taken by: [facility] CMA,  October 04, 2009 1:52 PM  Follow-up for Phone Call        her fever should be gone   although could have a 99.    sore throat should be better   .  congestion  may be unrelated  . after being on med for 3-4 days she shouldno longer be contagious.    if not better  at end of medication call and we can try to recheck her.  Follow-up by: Madelin Headings MD,  October 04, 2009 5:14 PM  Additional Follow-up for Phone Call Additional follow up Details #1::        Patient aware and said okay. Additional Follow-up by: Rudy Jew, RN,  October 05, 2009 8:51 AM

## 2010-11-22 NOTE — Progress Notes (Signed)
Summary: referral  Phone Note From Other Clinic   Caller: Patient Call For: Madelin Headings MD Summary of Call: Pt wants to see a "specialist" due to her stomach swelling..........??? [phone] Initial call taken by: Lynann Beaver CMA,  May 27, 2010 4:24 PM  Follow-up for Phone Call        Per Dr. Fabian Sharp- Wait until after seeing the Kidney specialist. If she wants to see her GI specialist she can.  Phone call completed Follow-up by: Romualdo Bolk, CMA (AAMA),  May 27, 2010 4:30 PM
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
## 2010-11-22 NOTE — Letter (Signed)
Summary: [facility]  [facility]   Imported By: Maryln Gottron 04/01/2010 13:52:13  _____________________________________________________________________  External Attachment:    Type:   Image     Comment:   External Document

## 2010-11-22 NOTE — Progress Notes (Signed)
Summary: letter  Phone Note Call from Patient Call back at Home Phone [phone]   Caller: Patient Call For: Madelin Headings MD Summary of Call: wants to know if Dr Fabian Sharp did letter for her UJ:[id number]. Initial call taken by: Raechel Ache, RN,  December 30, 2009 11:04 AM  Follow-up for Phone Call        Pt aware that we are working on letter and that we will call her once it is ready to pick up. Follow-up by: Romualdo Bolk, CMA (AAMA),  December 30, 2009 11:26 AM

## 2010-11-22 NOTE — Progress Notes (Signed)
Summary: Call-A-Nurse Report  Call-A-Nurse Triage Call Report Triage Record Num: [id number] Operator: Remonia Richter Patient Name: Desiree [name] Call Date & Time: 04/06/2010 9:07:00PM Patient Phone: [phone] PCP: Neta Mends. [name] Patient Gender: Female PCP Fax : [phone] Patient DOB: Sep 11, 1951 Practice Name: [facility] Reason for Call: blood sugar up to 362 this PM and was recently taken off Metformin due to kidney problems, able to do ADls and intake normal, denies s/s of hyperglycemia, all emergent s/s ruled out,home care advise given per diabetes guideline, advised to call office in am for further advise on blood sugar and medications Protocol(s) Used: Diabetes: Out of Control Recommended Outcome per Protocol: See Provider within 24 hours Reason for Outcome: Symptoms of early hyperglycemia (unusually thirsty, frequent urination, feeling tired or weak) Care Advice:  ~ List, or take, all current prescription(s), OTC or alternative medication(s) to provider for evaluation. Follow the usual meal plan if possible. Drink extra non-caloric fluids. If unable to eat at all, drink regular soft drinks and juices so that 50 grams of carbohydrates are taken in every 3 to 4 hours.  ~  ~ Test your blood sugar before driving. Do not drive if blood sugar 70 mg/dl or less. High Blood Sugar Treatment: - Follow action plan. - Adjust medications if instructed to do so in action plan or by provider. - Test blood sugar and ketones more often. - Record blood sugar and ketones in meter log or separate logbook, and take to all provider visits. - Drink extra water and other non-sugar fluids to prevent dehydration when the blood sugar is high and urine ketones are present.  ~  ~ Check blood sugar and ketones before calling provider for appointment. Blood Sugar and Ketone Testing: - Check blood sugar as recommended, and more often whenever it has been high or low, any change to the  treatment plan, increased stress, illness, infection, or a change to the regular schedule. - Test for urine ketones anytime the blood sugar is above 250 mg/dl, when there is stress, acute illness, nausea, vomiting, or abdominal pain. - Follow action plan for illness. - Record blood sugar and ketones in meter log or separate logbook, and take to all provider visits. - Drink extra water and other non-sugar fluids to prevent dehydration when the blood sugar is high and urine ketones are present.  ~ Go to ED immediately if blood sugar is 300 mg/dl or more AND symptoms are present including: large urine ketones, rapid, deep breathing, fruity breath, nausea, vomiting or abdominal pain; confused thinking, dry, flushed skin; extreme thirst, extreme fatigue or weakness; or frequent urination.  ~ Call provider immediately if blood sugar is 250 mg/dl two times in a row, or if most blood sugars are 250 mg/dl for more than 3 days. Call provider immediately if urine ketones are large (3+ to 4+).  ~  ~ SYMPTOM / CONDITION MANAGEMENT  ~ CAUTIONS 04/06/2010 9:19:32PM Page 1 of 1 [id number]  Appended Document: Call-A-Nurse Report start her on Actos 30 mg once daily . Call in #30 with 2 rf. See Dr. Fabian Sharp in 2-3 weeks.  Appended Document: Call-A-Nurse Report Medications Added ACTOS 30 MG TABS (PIOGLITAZONE HCL) 1 by mouth once daily       Pt aware and will come to pick up samples and schedule a follow up in 2 weeks. Romualdo Bolk, CMA (AAMA)  April 07, 2010 8:34 AM   Clinical Lists Changes  Medications: Added new medication of ACTOS 30 MG TABS (  PIOGLITAZONE HCL) 1 by mouth once daily

## 2010-11-22 NOTE — Progress Notes (Signed)
Summary: vitamin ??  Phone Note Call from Patient Call back at [phone]   Caller: vm Call For: panosh Summary of Call: Can I take single vitamins, not multivitamins, and is B12 a single or multiple vitamin and is it OK to take it?  [facility]  Initial call taken by: Rudy Jew, RN,  January 27, 2009 4:49 PM  Follow-up for Phone Call        ok to take b12 as a single vitamin and it is over the counter  1 beleive in 1000mg    Follow-up by: Madelin Headings MD,  January 28, 2009 8:12 AM  Additional Follow-up for Phone Call Additional follow up Details #1::        Patient informed. Additional Follow-up by: Rudy Jew, RN,  January 28, 2009 11:50 AM

## 2010-11-22 NOTE — Progress Notes (Signed)
Summary: flu?  Phone Note Call from Patient Call back at [phone]   Caller: vm Call For: panosh Summary of Call: Do I need to take medicine if I've been around someone who's been around ssomeone who had the flu?  Called patient.  She wants to be around a little girl shose mom has the flu.  Told her it sould be best if she not be around the little girl if her mom has the flu because she might expose herself to the flu.  She said OK. Initial call taken by: Rudy Jew, RN,  July 31, 2008 3:36 PM  Follow-up for Phone Call        agree Follow-up by: Madelin Headings MD,  July 31, 2008 4:37 PM
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
## 2010-11-22 NOTE — Progress Notes (Signed)
Summary: Loss of appetite, loosing weight  Phone Note Call from Patient Call back at [facility] Phone [phone]   Caller: Patient Call For: [name] Summary of Call: Pt reporting poor appetite, nothing tasting good, loosing weight, feels pretty good in general  Last OV pt was 159, currently 154 pounds.  Pt states nothing else has changed, home and family life remains unchanged.  Pt reports she is drinking plenty of fluids, denies frequent urination.  Pt has return OV scheduled for 9/29. Initial call taken by: Sid Falcon LPN,  June 15, 2008 4:24 PM

## 2010-11-22 NOTE — Letter (Signed)
Summary: Certification Request from Diabetic Treating Physician for Thera  Certification Request from Diabetic Treating Physician for Therapeutic Shoes/[facility]   Imported By: Maryln Gottron 05/26/2009 13:03:27  _____________________________________________________________________  External Attachment:    Type:   Image     Comment:   External Document

## 2010-11-22 NOTE — Progress Notes (Signed)
Summary: actos canceled  Phone Note Call from Patient Call back at Work Phone [phone]   Caller: vm Summary of Call: Change diabetic med from Actos.  It has been linked with cancer.  [facility] [address] difficult to understand Initial call taken by: Rudy Jew, RN,  July 18, 2010 11:36 AM  Follow-up for Phone Call        OK to stop actos i had told her she could probably stop  meds anyway   . Risk  of bladder cancer is   slight  by  population studies but since her BG is controlled better by lifestyle anyway ,  I agree with stopping med.   This means she should not eat the sugar     foods  and sweets.   Make sure   she has the hg a1c   scheduled for  her next labs in [place]. . Follow-up by: Madelin Headings MD,  July 18, 2010 12:42 PM     Appended Document: actos canceled Called to say she got message.

## 2010-11-22 NOTE — Progress Notes (Signed)
Summary: Vit E  Phone Note Call from Patient Call back at Work Phone [phone]   Summary of Call: Her instructions at checkout said Vit E 600mg , can only find 400mg .  She will take it.  If not OK let her know if she should take 2 of the 400mg  for awhile.   Initial call taken by: Rudy Jew, RN,  May 18, 2010 4:26 PM  Follow-up for Phone Call        i dont see any and dont remember advising vitamin E  at all. Did another  doctor  recommend this?  i rec vitamin D   800 to 1000 international units per day  for most patitnet s Follow-up by: Madelin Headings MD,  May 19, 2010 8:29 AM  Additional Follow-up for Phone Call Additional follow up Details #1::        Left message to inform. Additional Follow-up by: Rudy Jew, RN,  May 20, 2010 9:04 AM

## 2010-11-22 NOTE — Progress Notes (Signed)
Summary: verifing darvocet rx.  ---- Converted from flag ---- ---- 05/21/2008 8:10 AM, Madelin Headings MD wrote: I think its Tamelia Mcelhannon and we called it in and we'll check into it. thanks   ---- 05/20/2008 6:13 PM, Shaune Leeks MD wrote: Arvid Right call from [facility] [address] and [address] Sheria Lang [phone]) concerning script called in by Carollee Herter for Loann Quill DOB [birth year] for Darvocet. The pharmacy has note on this lady's account not to fill after hours scripts and they called me. I cannot find this lady in the EMR. I denied the prescription. They want to know if you want a police report filed. Please call me so I know you got this message. ------------------------------  I called the pharmacy and they said that they didn't have a [name] in the system. I told them that this was a legitimate rx and this was the pharmacy that the pt had in her chart. I told them to hold off on this rx until we call them back. I have left a message for the patient to call us and let us know what pharmacy we could call this into. Romualdo Bolk, CMA  May 21, 2008 8:24 AM  Pt called back stating that she needs rx called into [phone] [address]. I called this rx into them. I also called [facility] [address] and told them to shread the rx that was called in.  Romualdo Bolk, CMA  May 21, 2008 11:18 AM

## 2010-11-22 NOTE — Letter (Signed)
Summary: [facility] [address] consult   [facility]   Imported By: Maryln Gottron 08/13/2008 15:25:44  _____________________________________________________________________  External Attachment:    Type:   Image     Comment:   External Document
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
## 2010-11-22 NOTE — Progress Notes (Signed)
Summary: Pt req rx for One Touch Ultra Mini and test strips  Phone Note Call from Patient   Caller: Patient Summary of Call: Pt called req script for One Touch Ultra Mini and the Blue Test strips to the One touch ultra mini. [facility]  [address] [phone] Initial call taken by: Lucy Antigua,  January 12, 2010 1:58 PM  Follow-up for Phone Call        Rx sent to pharmacy for pt. Follow-up by: Romualdo Bolk, CMA (AAMA),  January 12, 2010 4:30 PM    New/Updated Medications: ONETOUCH ULTRA SYSTEM W/DEVICE KIT (BLOOD GLUCOSE MONITORING SUPPL) use as directed ONETOUCH ULTRA TEST  STRP (GLUCOSE BLOOD) check blood surgar two times a day dx 250.00 Prescriptions: ONETOUCH ULTRA TEST  STRP (GLUCOSE BLOOD) check blood surgar two times a day dx 250.00  #100 x 11   Entered by:   Romualdo Bolk, CMA (AAMA)   Authorized by:   Madelin Headings MD   Signed by:   Romualdo Bolk, CMA (AAMA) on 01/12/2010   Method used:   Electronically to        [facility]  [place] (retail)       [address]       [place], [place]  [address]       Ph: [phone] or [phone]       Fax: [phone]   RxID:   [id number] [facility] ULTRA SYSTEM W/DEVICE KIT (BLOOD GLUCOSE MONITORING SUPPL) use as directed  #1 x 0   Entered by:   Romualdo Bolk, CMA (AAMA)   Authorized by:   Madelin Headings MD   Signed by:   Romualdo Bolk, CMA (AAMA) on 01/12/2010   Method used:   Electronically to        [facility]  [place] (retail)       [address]       [place], [place]  [address]       Ph: [phone] or [phone]       Fax: [phone]   RxID:   [id number]
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
## 2010-11-22 NOTE — Letter (Signed)
Summary: [facility]  [facility]   Imported By: Maryln Gottron 05/20/2009 15:05:40  _____________________________________________________________________  External Attachment:    Type:   Image     Comment:   External Document
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
## 2010-11-22 NOTE — Assessment & Plan Note (Signed)
Summary: check moles behind ear/njr   Vital Signs:  Patient Profile:   60 Years Old Female Temp:     98.6 degrees F oral Pulse rate:   60 / minute BP sitting:   120 / 70  (left arm) Cuff size:   regular  Vitals Entered By: Romualdo Bolk, CMA (June 20, 2007 10:31 AM)                 Chief Complaint:  Moles behind left ear itchy x 1-2 years.  History of Present Illness: Itchy moles behind left ear as above . has lots of skin tags and moles around neck but theses don't itch. No bleeding or progression of symptom . no hx of infection.    Current Allergies (reviewed today): ! CODEINE    Risk Factors: Tobacco use:  quit    Year quit:  1993 Alcohol use:  no Exercise:  yes    Times per week:  2    Type:  walking Seatbelt use:  100 %  Colonoscopy History:    Date of Last Colonoscopy:  11/03/2004  Mammogram History:    Date of Last Mammogram:  07/16/2006    Physical Exam  General:     alert, well-developed, well-nourished, and well-hydrated.   Skin:     multiple small skin tags around neck and 1 excoriated 1mm in posterior auricular area. A second 1 mm almost filiform lesion next to it.  No redness or adenopathy or scaling. Cervical Nodes:     No lymphadenopathy noted    Impression & Recommendations:  Problem # 1:  ACROCHORDON (ICD-701.9) with secondary irritation. and possible warty lesion. Options discussed about treatment and follow.   Complete Medication List: 1)  Accu-chek Compact Test Drum Strp (Glucose blood) 2)  Aspir-81 81 Mg Tbec (Aspirin) .... Take 1 tablet by mouth once a day 3)  Celebrex 200 Mg Caps (Celecoxib) .Marland Kitchen.. 1 by mouth once daily 4)  Clonazepam 1 Mg Tabs (Clonazepam) .... Take 1-2 tablet by mouth at bedtime 5)  Crestor 10 Mg Tabs (Rosuvastatin calcium) .Marland Kitchen.. 1 by mouth once daily 6)  Cymbalta 30 Mg Cpep (Duloxetine hcl) .... Take 3 capsule by mouth once a day 7)  Metformin Hcl 500 Mg Tabs (Metformin hcl) .... Take 1 tablet by  mouth twice a day 8)  Pantoprazole Sodium 40 Mg Tbec (Pantoprazole sodium) .Marland Kitchen.. 1 by mouth once daily 9)  Triamterene-hctz 37.5-25 Mg Tabs (Triamterene-hctz) .... 1/2  by mouth once daily 10)  Zyprexa 5 Mg Tabs (Olanzapine) .... Take 1 tablet by mouth once a day   Patient Instructions: 1)  Try 1% hydrocortisone cream 2-3 x per day for itching and let me check the area at you next office visit.

## 2010-11-22 NOTE — Progress Notes (Signed)
Summary: samples   Phone Note Call from Patient Call back at Work Phone [phone]   Caller: Patient Summary of Call: Pt would like samples of actos and crestor. Initial call taken by: Romualdo Bolk, CMA Duncan Dull),  May 02, 2010 4:55 PM  Follow-up for Phone Call        ok if we have any  but  she has a rov to go over her labs this friday    we can give her  some them if available.   Follow-up by: Madelin Headings MD,  May 03, 2010 8:23 AM  Additional Follow-up for Phone Call Additional follow up Details #1::        Only have samples of crestor. Gave pt them. Pt aware and will pick them up on Friday. Additional Follow-up by: Romualdo Bolk, CMA (AAMA),  May 03, 2010 10:07 AM

## 2010-11-22 NOTE — Letter (Signed)
Summary: MD order  MD order   Imported By: Kassie Mends 05/21/2008 09:54:14  _____________________________________________________________________  External Attachment:    Type:   Image     Comment:   MD order

## 2010-11-22 NOTE — Progress Notes (Signed)
Summary: cannot take hydrocodone  Phone Note Call from Patient   Caller: live Call For: panosh Summary of Call: Not sure she can take hydrocodone, thinks she broke out with it, and did not pick up to Norco.  She did pick up the Tramadol & will try it only for now. Initial call taken by: Rudy Jew, RN,  September 27, 2009 12:17 PM  Follow-up for Phone Call        ok  Follow-up by: Madelin Headings MD,  September 27, 2009 1:01 PM   New Allergies: ! HYDROCODONE-ACETAMINOPHEN (HYDROCODONE-ACETAMINOPHEN) New Allergies: ! HYDROCODONE-ACETAMINOPHEN (HYDROCODONE-ACETAMINOPHEN)

## 2010-11-22 NOTE — Progress Notes (Signed)
Summary: FYI for Dr. Fabian Sharp  Phone Note Call from Patient   Caller: Patient Call For: Madelin Headings MD Summary of Call: Pt wants Dr Fabian Sharp to know she has been diagnosed with cataracts and it is not glaucoma. Initial call taken by: Lynann Beaver CMA,  April 13, 2009 3:50 PM  New Problems: CATARACTS (ICD-366.9)   New Problems: CATARACTS (ICD-366.9)

## 2010-11-22 NOTE — Progress Notes (Signed)
Summary: wants Tramadol  Phone Note Call from Patient   Summary of Call[facility]) [phone]  [phone] Wants to go back on Tramadol for pain. Neck is still swollen and having left ear and chin pain. Initial call taken by: Lynann Beaver CMA,  October 11, 2009 11:49 AM  Follow-up for Phone Call        This was called in on 12/3 #120 with 1 refill. Pt aware of this. Follow-up by: Romualdo Bolk, CMA (AAMA),  October 11, 2009 11:54 AM  Additional Follow-up for Phone Call Additional follow up Details #1::        Per Dr. Fabian Sharp- if fever, swollen glands and sore throat, needs ov. Pt does have a sore throat and swollen glands. Pt to come in tomorrow. Additional Follow-up by: Romualdo Bolk, CMA (AAMA),  October 11, 2009 5:24 PM
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
## 2010-11-22 NOTE — Progress Notes (Signed)
----   Converted from flag ---- ---- 07/22/2008 9:03 PM, Madelin Headings MD wrote: please arrange for pt to have one at [facility] 2 view pa and lat  dx weight loss thanks  ---- 07/22/2008 9:54 AM, Romualdo Bolk, CMA wrote: Her last CXR 07/10/06 at [facility].  ---- 07/22/2008 9:21 AM, Madelin Headings MD wrote: please pull chart  and see if you can find out when her last cxray was  here or at hospital ------------------------------  Pt aware and appt made. Order put in EMR.Romualdo Bolk, CMA  July 24, 2008 2:01 PM

## 2010-11-22 NOTE — Progress Notes (Signed)
Summary: refill  Phone Note From Pharmacy   [facility]  [address] [phone][facility] Reason for Call: Needs renewal Details for Reason: Propoxyphen-apap Initial call taken by: Romualdo Bolk, CMA,  June 17, 2008 10:44 AM  Follow-up for Phone Call        Per md- ok this time plus 1 additional. Sent back by fax. Follow-up by: Romualdo Bolk, CMA,  June 17, 2008 10:44 AM      Prescriptions: DARVOCET-N 100 100-650 MG  TABS (PROPOXYPHENE N-APAP) 1 by mouth three times a day.  #40 x 1   Entered by:   Romualdo Bolk, CMA   Authorized by:   Madelin Headings MD   Signed by:   Romualdo Bolk, CMA on 06/17/2008   Method used:   Telephoned to ...       [facility]  [address] [phone][facility] (retail)       [address]       [place], [place]  [address]       Ph: [phone]       Fax: [phone]   RxID:   [id number]

## 2010-11-22 NOTE — Progress Notes (Signed)
Summary: HAND SHAKES AND SHE THROWS UP EVERY FEW DAYS   Phone Note Call from Patient   Caller: PATIENT LIVE Call For: [facility]  Summary of Call: LEFT HAND SHAKES AND SHE HAS BEEN THROWING UP EVERY FEW DAYS.  SHOULD SHE BE CONCERNED  Initial call taken by: Roselle Locus,  December 03, 2007 4:32 PM  Follow-up for Phone Call        Has been going on about two weeks, vomits quite a lot. 10:45 2-11. Follow-up by: Rudy Jew, RN,  December 03, 2007 4:48 PM
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
## 2010-11-22 NOTE — Progress Notes (Signed)
Summary: Darvocette  Phone Note Call from Patient   Caller: Patient Call For: Dr. Fabian Sharp Summary of Call: Pt would like to speak to [facility] about the Darvocette prescription she had requested. [phone] Initial call taken by: Lynann Beaver CMA,  November 05, 2008 12:51 PM  Follow-up for Phone Call        LMTOCB Follow-up by: Romualdo Bolk, CMA,  November 06, 2008 10:10 AM  Additional Follow-up for Phone Call Additional follow up Details #1::        I spoke with pt and she has everything taken care of. Additional Follow-up by: Romualdo Bolk, CMA,  November 06, 2008 2:39 PM

## 2010-11-22 NOTE — Letter (Signed)
Summary: [facility]  [facility]   Imported By: Maryln Gottron 12/03/2009 14:12:01  _____________________________________________________________________  External Attachment:    Type:   Image     Comment:   External Document
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
## 2010-11-22 NOTE — Progress Notes (Signed)
Summary: allergies  Phone Note Call from Patient Call back at [phone]   Caller: vm Call For: Akeela Busk Summary of Call: What type of allergies do I have? Initial call taken by: Rudy Jew, RN,  July 28, 2008 11:52 AM  Follow-up for Phone Call        dont know this  .. however the type of cells in you blood count that  are related to allergy  are elevated . this is a nosnpecific finding and sometimes other  medical conditions can cause this.  It would be rare for allergy to cause weight loss unless its related to a gastointestinal inflammation.   Follow-up by: Madelin Headings MD,  July 28, 2008 1:53 PM  Additional Follow-up for Phone Call Additional follow up Details #1::        LM as per patient's callin. Additional Follow-up by: Rudy Jew, RN,  July 28, 2008 2:10 PM

## 2010-11-22 NOTE — Letter (Signed)
Summary: [facility]   Imported By: Maryln Gottron 07/20/2010 14:09:08  _____________________________________________________________________  External Attachment:    Type:   Image     Comment:   External Document

## 2010-11-22 NOTE — Letter (Signed)
Summary: [facility]  [facility]   Imported By: Maryln Gottron 08/18/2008 12:50:09  _____________________________________________________________________  External Attachment:    Type:   Image     Comment:   External Document

## 2010-11-22 NOTE — Progress Notes (Signed)
Summary: PT has increase her sugar intake-this is why her BS is up  Phone Note Call from Patient Call back at [phone]   Caller: daughter-Nicole Summary of Call: Pt's daughter called saying that she increased her sugar intake. So this is why her Blood Sugar when up. She went from popsicles to ice cream sandwiches. She is also drinking alot of soda that are not diet. Her mom did want to tell us this but she thought it was important that we know this. Initial call taken by: Romualdo Bolk, CMA Duncan Dull),  April 07, 2010 10:34 AM  Follow-up for Phone Call        noted. She will start on Actos as above Follow-up by: Nelwyn Salisbury MD,  April 07, 2010 11:56 AM

## 2010-11-22 NOTE — Progress Notes (Signed)
Summary: Change pantoprazole to omeprazole  Phone Note Call from Patient Call back at Home Phone [phone]   Caller: Patient Summary of Call: Pt's insurance will not cover pantoprazole 20mg . Pt called her insurance and they said that they would cover omeprazole. Can we change it to this? Initial call taken by: Romualdo Bolk, CMA Duncan Dull),  August 02, 2010 4:48 PM  Follow-up for Phone Call        ok to change   to omeprezole  1 by mouth once daily 20 mg  Follow-up by: Madelin Headings MD,  August 03, 2010 9:37 AM  Additional Follow-up for Phone Call Additional follow up Details #1::        Rx sent to pharmacy and pt aware of this. Additional Follow-up by: Romualdo Bolk, CMA (AAMA),  August 03, 2010 9:47 AM    New/Updated Medications: OMEPRAZOLE 20 MG CPDR (OMEPRAZOLE) 1 by mouth once daily Prescriptions: OMEPRAZOLE 20 MG CPDR (OMEPRAZOLE) 1 by mouth once daily  #30 x 5   Entered by:   Romualdo Bolk, CMA (AAMA)   Authorized by:   Madelin Headings MD   Signed by:   Romualdo Bolk, CMA (AAMA) on 08/03/2010   Method used:   [facility]        [facility]  [address] [phone]* (retail)       [address]       [place], [place]  [address]       Ph: [phone] or [phone]       Fax: [phone]   RxID:   [id number]

## 2010-11-22 NOTE — Letter (Signed)
Summary: [facility]   Imported By: Maryln Gottron 02/08/2010 13:50:19  _____________________________________________________________________  External Attachment:    Type:   Image     Comment:   External Document

## 2010-11-22 NOTE — Progress Notes (Signed)
Summary: refill on darvocet  Phone Note From Pharmacy   [facility]  [place] [facility] Reason for Call: Needs renewal Details for Reason: Proproxyphen-apap Summary of Call: Last filled on 07/21/08 #40 Initial call taken by: Romualdo Bolk, CMA,  August 11, 2008 5:07 PM  Follow-up for Phone Call        Per Dr. Fabian Sharp- okay x 1. Rx called in. Follow-up by: Romualdo Bolk, CMA,  August 12, 2008 2:26 PM      Prescriptions: DARVOCET-N 100 100-650 MG  TABS (PROPOXYPHENE N-APAP) 1 by mouth three times a day.  #40 x 0   Entered by:   Romualdo Bolk, CMA   Authorized by:   Madelin Headings MD   Signed by:   Romualdo Bolk, CMA on 08/12/2008   Method used:   Telephoned to ...       [facility]  [place] (retail)       [address]       [place], [place]  [address]       Ph: [phone] or [phone]       Fax: [phone]   RxID:   [id number]

## 2010-11-22 NOTE — Progress Notes (Signed)
Summary: taken off celebrex and is in pain   Phone Note Call from Patient Call back at Home Phone [phone]   Caller: patient vm triage Call For: panosh  Summary of Call: she was taken off celebrex and she is in pain  Initial call taken by: Roselle Locus,  December 26, 2007 2:06 PM  Follow-up for Phone Call        Pt is here to see md today. Dr. Fabian Sharp will address at office visit. Follow-up by: Romualdo Bolk, CMA,  December 27, 2007 2:46 PM

## 2010-11-22 NOTE — Progress Notes (Signed)
Summary: dizziness  Phone Note Call from Patient Call back at Home Phone [phone]   Caller: Patient Summary of Call: Pt left a voicemail saying that she is having dizziness and can't eat.  Initial call taken by: Romualdo Bolk, CMA (AAMA),  May 31, 2009 2:12 PM  Follow-up for Phone Call        Spoke with pt-she is dizzy and not able to eat. This started after she changed her medication at the psych office. She did call her psych office but they haven't called her back yet. Her BP is 110/64.  Pt has finished a bowel of cherrios and a glass of OJ and water. Pt thinks that this is due her being off clonazapam and started trazadone. Pt has been on this for about 1 month. The dizziness started about 1-2 weeks ago. Pt states that her bp has been running upper 130's/ 80's.  Follow-up by: Romualdo Bolk, CMA (AAMA),  May 31, 2009 2:15 PM  Additional Follow-up for Phone Call Additional follow up Details #1::        it does sound like this is related to her psychiatric meds since her BP is so stable. She needs to speak with them about this Additional Follow-up by: Nelwyn Salisbury MD,  May 31, 2009 5:22 PM    Additional Follow-up for Phone Call Additional follow up Details #2::    Pt aware of this. Follow-up by: Romualdo Bolk, CMA (AAMA),  June 01, 2009 8:38 AM

## 2010-11-22 NOTE — Progress Notes (Signed)
Summary: stomach doctor  Phone Note Call from Patient   Caller: vm Call For: panosh Summary of Call: I've made appt to see my previous stomach doctor years ago, Dr. Loreta Ave [phone].  Cancel appt Dr. Leone Payor.   Initial call taken by: Rudy Jew, RN,  July 23, 2008 12:45 PM  Follow-up for Phone Call        Patient cancelled appt Follow-up by: Florentina Addison,  July 23, 2008 12:48 PM

## 2010-11-22 NOTE — Progress Notes (Signed)
Summary: Darvocette question  Phone Note Call from Patient   Caller: Patient Call For: Madelin Headings MD Reason for Call: Talk to Nurse Summary of Call: Pt wants Dr. Fabian Sharp to subtitute some other pain pill for Darvocette. [phone] Initial call taken by: Lynann Beaver CMA,  September 23, 2009 3:49 PM  Follow-up for Phone Call        Unsure what would be best yet. What was her reaction to codeine and is she still taking  Ultram?  Has she been able to take hydrocodone.? Follow-up by: Madelin Headings MD,  September 24, 2009 12:24 PM  Additional Follow-up for Phone Call Additional follow up Details #1::        Called pt cell [phone], she is not taking Ultram, she states she has not had any problems with Hydrocodone in the past.  Pt requesting a refill of Tr[facility]adol. [facility] [place] Additional Follow-up by: Sid Falcon LPN,  September 24, 2009 12:35 PM   New Allergies: CODEINE Additional Follow-up for Phone Call Additional follow up Details #2::    ultram and tramadol are the same and we can refill this  as requested.  disp 120 refill x 1  Hydrocodone  is not codiene but a codeine derivative and   can give similar side effects.    However if she is sure she can take hydrocodone then .we can try norco 325/5  1 by mouth q4-6 hours as needed severe pain disp 30 no refill Follow-up by: Madelin Headings MD,  September 24, 2009 1:21 PM  Additional Follow-up for Phone Call Additional follow up Details #3:: Details for Additional Follow-up Action Taken: Pt informed Additional Follow-up by: Sid Falcon LPN,  September 24, 2009 1:41 PM  New/Updated Medications: NORCO 5-325 MG TABS (HYDROCODONE-ACETAMINOPHEN) one tab every 4-6 hours as needed for severe pain New Allergies: CODEINEPrescriptions: TRAMADOL HCL 50 MG  TABS (TRAMADOL HCL) 1-2 by mouth tid  as needed for pain  pain  #120 Tablet x 1   Entered by:   Sid Falcon LPN   Authorized by:   Madelin Headings MD   Signed by:   Sid Falcon LPN on  29/56/2130   Method used:   Telephoned to ...       [facility]  [place] (retail)       [address]       [place], [place]  [address]       Ph: [phone] or [phone]       Fax: [phone]   RxID:   [id number] NORCO 5-325 MG TABS (HYDROCODONE-ACETAMINOPHEN) one tab every 4-6 hours as needed for severe pain  #30 x 0   Entered by:   Sid Falcon LPN   Authorized by:   Madelin Headings MD   Signed by:   Sid Falcon LPN on 95/18/8416   Method used:   Telephoned to ...       [facility]  [place] (retail)       [address]       [place], [place]  [address]       Ph: [phone] or [phone]       Fax: [phone]   RxID:   [id number]

## 2010-11-22 NOTE — Progress Notes (Signed)
Summary: ok to take vitamins  Phone Note Call from Patient   Caller: Patient Call For: Madelin Headings MD Reason for Call: Lab or Test Results Summary of Call: Pt. wants to know if Dr. Fabian Sharp thinks if would be ok for her to take Vitamins for hair, skin and nails. [phone] Initial call taken by: Lynann Beaver CMA,  January 18, 2009 10:40 AM  Follow-up for Phone Call        I would guess ok of not   mega doses  Follow-up by: Madelin Headings MD,  January 18, 2009 4:13 PM  Additional Follow-up for Phone Call Additional follow up Details #1::        Left message on pt's voice mail. Additional Follow-up by: Lynann Beaver CMA,  January 19, 2009 8:26 AM

## 2010-11-22 NOTE — Letter (Signed)
Summary: [facility]  [facility]   Imported By: Maryln Gottron 12/03/2008 11:22:41  _____________________________________________________________________  External Attachment:    Type:   Image     Comment:   External Document

## 2010-11-22 NOTE — Letter (Signed)
Summary: Certification Request from Diabetic Treating Physician for Dispe  Certification Request from Diabetic Treating Physician for Dispensing of Therapeutic Shoes/[facility]   Imported By: Maryln Gottron 03/11/2009 11:04:55  _____________________________________________________________________  External Attachment:    Type:   Image     Comment:   External Document

## 2010-11-24 NOTE — Progress Notes (Signed)
Summary: refill  Phone Note Call from Patient Call back at Home Phone [phone]   Caller: Patient Summary of Call: refill on crestor. Initial call taken by: Romualdo Bolk, CMA Duncan Dull),  October 25, 2010 4:09 PM  Follow-up for Phone Call        rx sent to pharmacy Follow-up by: Romualdo Bolk, CMA (AAMA),  October 25, 2010 4:09 PM    Prescriptions: CRESTOR 10 MG TABS (ROSUVASTATIN CALCIUM) 1 by mouth once daily  #30 Tablet x 2   Entered by:   Romualdo Bolk, CMA (AAMA)   Authorized by:   Madelin Headings MD   Signed by:   Romualdo Bolk, CMA (AAMA) on 10/25/2010   Method used:   [facility] ...       [facility] [address] [phone][facility] (retail)       [address]       [place], [place]  [address]       Ph: [phone] or [phone]       Fax: [phone]   RxID:   [id number]

## 2010-11-24 NOTE — Letter (Signed)
Summary: [facility]   Imported By: Maryln Gottron 10/18/2010 13:26:23  _____________________________________________________________________  External Attachment:    Type:   Image     Comment:   External Document

## 2010-11-24 NOTE — Progress Notes (Signed)
Summary: Needs Home Health  Phone Note Call from Patient Call back at Home Phone [phone]   Caller: Patient Summary of Call: Spoke to pt and she needs home health because she is falling more. She also needs help cleaning her house and giving her a bath. Her daughter has moved out of the house and pt is now living alone. Initial call taken by: Romualdo Bolk, CMA (AAMA),  November 07, 2010 11:00 AM

## 2010-11-24 NOTE — Medication Information (Signed)
Summary: Rx for Diabetic Shoes/[facility]  Rx for Diabetic Shoes/[facility]   Imported By: Maryln Gottron 11/18/2010 13:48:29  _____________________________________________________________________  External Attachment:    Type:   Image     Comment:   External Document

## 2010-11-24 NOTE — Progress Notes (Signed)
Summary: SAMPLES PLEASE  Phone Note Call from Patient Call back at Home Phone [phone] Call back at Work Phone [phone]   Caller: Patient Call For: Madelin Headings MD Summary of Call: pt needs samples of crestor 10mg  waiting on PA Initial call taken by: Heron Sabins,  October 27, 2010 11:39 AM  Follow-up for Phone Call        pt isPer Dr. Fabian Sharp- Okay to give 1 month supply Follow-up by: Romualdo Bolk, CMA (AAMA),  October 27, 2010 12:47 PM  Additional Follow-up for Phone Call Additional follow up Details #1::        pt is aware samples up front  Additional Follow-up by: Heron Sabins,  October 27, 2010 1:01 PM
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
## 2010-11-25 NOTE — Letter (Signed)
[facility]   Imported By: Kassie Mends 07/03/2008 08:45:35  _____________________________________________________________________  External Attachment:    Type:   Image     Comment:   [facility]

## 2010-11-26 ENCOUNTER — Telehealth: Payer: Self-pay | Admitting: Internal Medicine

## 2010-11-28 ENCOUNTER — Telehealth: Payer: Self-pay | Admitting: *Deleted

## 2010-11-28 ENCOUNTER — Other Ambulatory Visit: Payer: Self-pay | Admitting: *Deleted

## 2010-11-28 MED ORDER — FUROSEMIDE 20 MG PO TABS: 20.0000 mg | ORAL_TABLET | Freq: Every day | ORAL | Status: DC

## 2010-11-28 NOTE — Telephone Encounter (Signed)
Addended by: Tor Netters on: 11/28/2010 02:19 PM   Modules accepted: Orders

## 2010-11-28 NOTE — Telephone Encounter (Signed)
Sent  rx and shreaded the one that was printed

## 2010-11-28 NOTE — Telephone Encounter (Signed)
I told pt as long she is not having any pain, then just take the medication. Then call us tomorrow to let us know how she is doing.

## 2010-11-28 NOTE — Telephone Encounter (Signed)
Pt needs a refill on lasix. Pt aware that rx was sent to pharmacy.

## 2010-11-30 NOTE — Progress Notes (Signed)
----   Converted from flag ---- ---- 11/14/2010 8:09 AM, Romualdo Bolk, CMA (AAMA) wrote: I'm almost certain that was her weight. Also I spoke to her personally and she did say that she was still taking the actos. She didn't stop it after all. I thought I documented that?  ---- 11/13/2010 11:19 PM, Madelin Headings MD wrote: i thought she had gone off the actos  and  we refilled it. Also the weight recorded at last visit was 40 # different  what do you think the real weight was?   wp ------------------------------

## 2010-11-30 NOTE — Progress Notes (Signed)
Summary: bp 146/93  Phone Note Call from Patient Call back at [facility] Phone [phone] Call back at Work Phone [phone]   Caller: Patient Summary of Call: Pt called saying that she was returning a call. She also left a voicemail saying that her bp was 146/93. I tried to call pt back but home number I couldn't leave a message then left a message on other number to call back. Initial call taken by: Romualdo Bolk, CMA (AAMA),  November 21, 2010 4:45 PM  Follow-up for Phone Call        she lives in a new appt. poss has a different number can continue monitoring.  and  keep [place] informed  if    continuig elevated over the next  month or as needed. Follow-up by: Madelin Headings MD,  November 22, 2010 5:07 PM  Additional Follow-up for Phone Call Additional follow up Details #1::        I tried to call pt back but couldn't leave a message. I will try again tomorrow. Additional Follow-up by: Romualdo Bolk, CMA Duncan Dull),  November 22, 2010 5:12 PM    Additional Follow-up for Phone Call Additional follow up Details #2::    Pt aware of this. Follow-up by: Romualdo Bolk, CMA (AAMA),  November 22, 2010 5:13 PM

## 2010-11-30 NOTE — Progress Notes (Signed)
Summary: diabetic shoes  Phone Note Call from Patient   Caller: Patient Call For: Madelin Headings MD Summary of Call: Wants forms filled out for the diabetic shoes. Initial call taken by: [facility] CMA AAMA,  November 16, 2010 3:15 PM  Follow-up for Phone Call        filled out. Follow-up by: Madelin Headings MD,  November 22, 2010 5:15 PM  Additional Follow-up for Phone Call Additional follow up Details #1::        Forms faxed. Pt aware of this. Additional Follow-up by: Romualdo Bolk, CMA (AAMA),  November 22, 2010 5:31 PM

## 2010-12-05 ENCOUNTER — Emergency Department (HOSPITAL_COMMUNITY): Payer: BC Managed Care – PPO

## 2010-12-05 ENCOUNTER — Emergency Department (HOSPITAL_COMMUNITY)
Admission: EM | Admit: 2010-12-05 | Discharge: 2010-12-06 | Disposition: A | Discharge status: Home / Self Care | Payer: BC Managed Care – PPO | Attending: Emergency Medicine | Admitting: Emergency Medicine

## 2010-12-05 ENCOUNTER — Telehealth: Payer: Self-pay | Admitting: *Deleted

## 2010-12-05 DIAGNOSIS — R05 Cough: Secondary | ICD-10-CM | POA: Insufficient documentation

## 2010-12-05 DIAGNOSIS — R109 Unspecified abdominal pain: Secondary | ICD-10-CM | POA: Insufficient documentation

## 2010-12-05 DIAGNOSIS — K5900 Constipation, unspecified: Secondary | ICD-10-CM | POA: Insufficient documentation

## 2010-12-05 DIAGNOSIS — E119 Type 2 diabetes mellitus without complications: Secondary | ICD-10-CM | POA: Insufficient documentation

## 2010-12-05 DIAGNOSIS — R059 Cough, unspecified: Secondary | ICD-10-CM | POA: Insufficient documentation

## 2010-12-05 DIAGNOSIS — F3289 Other specified depressive episodes: Secondary | ICD-10-CM | POA: Insufficient documentation

## 2010-12-05 DIAGNOSIS — N289 Disorder of kidney and ureter, unspecified: Secondary | ICD-10-CM | POA: Insufficient documentation

## 2010-12-05 DIAGNOSIS — F329 Major depressive disorder, single episode, unspecified: Secondary | ICD-10-CM | POA: Insufficient documentation

## 2010-12-05 DIAGNOSIS — Z79899 Other long term (current) drug therapy: Secondary | ICD-10-CM | POA: Insufficient documentation

## 2010-12-05 DIAGNOSIS — R0602 Shortness of breath: Secondary | ICD-10-CM | POA: Insufficient documentation

## 2010-12-05 DIAGNOSIS — I10 Essential (primary) hypertension: Secondary | ICD-10-CM | POA: Insufficient documentation

## 2010-12-05 LAB — CBC
HCT: 34.5 % — ABNORMAL LOW (ref 36.0–46.0)
Hemoglobin: 11.4 g/dL — ABNORMAL LOW (ref 12.0–15.0)
MCH: 30.2 pg (ref 26.0–34.0)
MCHC: 33 g/dL (ref 30.0–36.0)
MCV: 91.5 fL (ref 78.0–100.0)
Platelets: 327 10*3/uL (ref 150–400)
RBC: 3.77 MIL/uL — ABNORMAL LOW (ref 3.87–5.11)
RDW: 15.7 % — ABNORMAL HIGH (ref 11.5–15.5)
WBC: 9.5 10*3/uL (ref 4.0–10.5)

## 2010-12-05 LAB — URINALYSIS, ROUTINE W REFLEX MICROSCOPIC
Bilirubin Urine: NEGATIVE
Hgb urine dipstick: NEGATIVE
Ketones, ur: NEGATIVE mg/dL
Nitrite: NEGATIVE
Protein, ur: NEGATIVE mg/dL
Specific Gravity, Urine: 1.016 (ref 1.005–1.030)
Urine Glucose, Fasting: NEGATIVE mg/dL
Urobilinogen, UA: 0.2 mg/dL (ref 0.0–1.0)
pH: 5.5 (ref 5.0–8.0)

## 2010-12-05 LAB — DIFFERENTIAL
Basophils Absolute: 0 10*3/uL (ref 0.0–0.1)
Basophils Relative: 0 % (ref 0–1)
Eosinophils Absolute: 0.6 10*3/uL (ref 0.0–0.7)
Eosinophils Relative: 6 % — ABNORMAL HIGH (ref 0–5)
Lymphocytes Relative: 30 % (ref 12–46)
Lymphs Abs: 2.9 10*3/uL (ref 0.7–4.0)
Monocytes Absolute: 0.9 10*3/uL (ref 0.1–1.0)
Monocytes Relative: 9 % (ref 3–12)
Neutro Abs: 5.2 10*3/uL (ref 1.7–7.7)
Neutrophils Relative %: 54 % (ref 43–77)

## 2010-12-05 LAB — COMPREHENSIVE METABOLIC PANEL
ALT: 14 U/L (ref 0–35)
AST: 23 U/L (ref 0–37)
Albumin: 3.6 g/dL (ref 3.5–5.2)
Alkaline Phosphatase: 90 U/L (ref 39–117)
BUN: 26 mg/dL — ABNORMAL HIGH (ref 6–23)
CO2: 27 mEq/L (ref 19–32)
Calcium: 9.2 mg/dL (ref 8.4–10.5)
Chloride: 101 mEq/L (ref 96–112)
Creatinine, Ser: 2.08 mg/dL — ABNORMAL HIGH (ref 0.4–1.2)
GFR calc Af Amer: 30 mL/min — ABNORMAL LOW (ref >60)
GFR calc non Af Amer: 24 mL/min — ABNORMAL LOW (ref >60)
Glucose, Bld: 100 mg/dL — ABNORMAL HIGH (ref 70–99)
Potassium: 5.1 mEq/L (ref 3.5–5.1)
Sodium: 138 mEq/L (ref 135–145)
Total Bilirubin: 0.3 mg/dL (ref 0.3–1.2)
Total Protein: 7.4 g/dL (ref 6.0–8.3)

## 2010-12-05 LAB — POCT CARDIAC MARKERS
CKMB, poc: 1.8 ng/mL (ref 1.0–8.0)
Myoglobin, poc: 176 ng/mL (ref 12–200)
Troponin i, poc: <0.05 ng/mL (ref 0.00–0.09)

## 2010-12-05 LAB — LIPASE, BLOOD: Lipase: 39 U/L (ref 11–59)

## 2010-12-05 NOTE — Telephone Encounter (Signed)
Between and navel is swollen and has sinus infection.  Appt given

## 2010-12-05 NOTE — Telephone Encounter (Signed)
FYI

## 2010-12-05 NOTE — Telephone Encounter (Signed)
Spoke to pt- She is having ankles swelling, left worse than rt, bloody nasal discharge, feels like something is in throat, between chest and navel is swelling, sob, wheezing. Pt states that bp is little high. I advise pt to go to the ED tonight and then have pt call us tomorrow to let us know how she is doing. I cancelled pt's appt for tomorrow. Pt aware of this.

## 2010-12-06 ENCOUNTER — Ambulatory Visit: Payer: Self-pay | Admitting: Internal Medicine

## 2010-12-06 ENCOUNTER — Emergency Department (HOSPITAL_COMMUNITY)
Admit: 2010-12-06 | Discharge: 2010-12-06 | Disposition: A | Discharge status: Home / Self Care | Payer: BC Managed Care – PPO | Attending: Emergency Medicine | Admitting: Emergency Medicine

## 2010-12-06 ENCOUNTER — Telehealth: Payer: Self-pay | Admitting: *Deleted

## 2010-12-06 DIAGNOSIS — Z7982 Long term (current) use of aspirin: Secondary | ICD-10-CM | POA: Insufficient documentation

## 2010-12-06 DIAGNOSIS — R142 Eructation: Secondary | ICD-10-CM | POA: Insufficient documentation

## 2010-12-06 DIAGNOSIS — R110 Nausea: Secondary | ICD-10-CM | POA: Insufficient documentation

## 2010-12-06 DIAGNOSIS — F329 Major depressive disorder, single episode, unspecified: Secondary | ICD-10-CM | POA: Insufficient documentation

## 2010-12-06 DIAGNOSIS — M129 Arthropathy, unspecified: Secondary | ICD-10-CM | POA: Insufficient documentation

## 2010-12-06 DIAGNOSIS — K5900 Constipation, unspecified: Secondary | ICD-10-CM | POA: Insufficient documentation

## 2010-12-06 DIAGNOSIS — F3289 Other specified depressive episodes: Secondary | ICD-10-CM | POA: Insufficient documentation

## 2010-12-06 DIAGNOSIS — Z79899 Other long term (current) drug therapy: Secondary | ICD-10-CM | POA: Insufficient documentation

## 2010-12-06 DIAGNOSIS — N289 Disorder of kidney and ureter, unspecified: Secondary | ICD-10-CM | POA: Insufficient documentation

## 2010-12-06 DIAGNOSIS — R141 Gas pain: Secondary | ICD-10-CM | POA: Insufficient documentation

## 2010-12-06 DIAGNOSIS — E119 Type 2 diabetes mellitus without complications: Secondary | ICD-10-CM | POA: Insufficient documentation

## 2010-12-06 DIAGNOSIS — E7800 Pure hypercholesterolemia, unspecified: Secondary | ICD-10-CM | POA: Insufficient documentation

## 2010-12-06 DIAGNOSIS — R1011 Right upper quadrant pain: Secondary | ICD-10-CM | POA: Insufficient documentation

## 2010-12-06 DIAGNOSIS — I10 Essential (primary) hypertension: Secondary | ICD-10-CM | POA: Insufficient documentation

## 2010-12-06 MED ORDER — XENON XE 133 GAS: 5.0000 | GAS_FOR_INHALATION | Freq: Once | RESPIRATORY_TRACT | Status: AC | PRN

## 2010-12-06 MED ORDER — TECHNETIUM TO 99M ALBUMIN AGGREGATED
5.1700 | Freq: Once | INTRAVENOUS | Status: AC | PRN
  Administered 2010-12-06: 5.17 via INTRAVENOUS

## 2010-12-06 MED ORDER — XENON XE 133 GAS
5.0000 | GAS_FOR_INHALATION | Freq: Once | RESPIRATORY_TRACT | Status: AC | PRN
  Administered 2010-12-06: 9.87 via RESPIRATORY_TRACT

## 2010-12-06 NOTE — Telephone Encounter (Signed)
ER called to let Dr. Fabian Sharp know she was constipated and Renal Insufficiency..  Was to call [facility] today.  Everything else normal

## 2010-12-06 NOTE — Telephone Encounter (Signed)
Patient called, not ER.

## 2010-12-27 ENCOUNTER — Other Ambulatory Visit: Payer: Self-pay

## 2011-01-03 ENCOUNTER — Ambulatory Visit: Payer: Self-pay | Admitting: Internal Medicine

## 2011-01-05 ENCOUNTER — Other Ambulatory Visit: Payer: Self-pay | Admitting: Neurology

## 2011-01-05 DIAGNOSIS — R269 Unspecified abnormalities of gait and mobility: Secondary | ICD-10-CM

## 2011-01-05 DIAGNOSIS — M79609 Pain in unspecified limb: Secondary | ICD-10-CM

## 2011-01-06 ENCOUNTER — Ambulatory Visit
Admission: RE | Admit: 2011-01-06 | Discharge: 2011-01-06 | Disposition: A | Discharge status: Home / Self Care | Payer: Medicare Other | Source: Ambulatory Visit | Attending: Neurology | Admitting: Neurology

## 2011-01-06 DIAGNOSIS — R269 Unspecified abnormalities of gait and mobility: Secondary | ICD-10-CM

## 2011-01-06 DIAGNOSIS — M79609 Pain in unspecified limb: Secondary | ICD-10-CM

## 2011-01-09 LAB — CBC
HCT: 36 % (ref 36.0–46.0)
Hemoglobin: 12.1 g/dL (ref 12.0–15.0)
MCHC: 33.5 g/dL (ref 30.0–36.0)
MCV: 92.1 fL (ref 78.0–100.0)
Platelets: 243 10*3/uL (ref 150–400)
RBC: 3.91 MIL/uL (ref 3.87–5.11)
RDW: 17.3 % — ABNORMAL HIGH (ref 11.5–15.5)
WBC: 8.4 10*3/uL (ref 4.0–10.5)

## 2011-01-09 LAB — PROTIME-INR
INR: 0.94 (ref 0.00–1.49)
Prothrombin Time: 12.5 seconds (ref 11.6–15.2)

## 2011-01-09 LAB — GLUCOSE, CAPILLARY
Glucose-Capillary: 105 mg/dL — ABNORMAL HIGH (ref 70–99)
Glucose-Capillary: 109 mg/dL — ABNORMAL HIGH (ref 70–99)
Glucose-Capillary: 109 mg/dL — ABNORMAL HIGH (ref 70–99)
Glucose-Capillary: 110 mg/dL — ABNORMAL HIGH (ref 70–99)
Glucose-Capillary: 112 mg/dL — ABNORMAL HIGH (ref 70–99)
Glucose-Capillary: 116 mg/dL — ABNORMAL HIGH (ref 70–99)
Glucose-Capillary: 121 mg/dL — ABNORMAL HIGH (ref 70–99)
Glucose-Capillary: 128 mg/dL — ABNORMAL HIGH (ref 70–99)
Glucose-Capillary: 134 mg/dL — ABNORMAL HIGH (ref 70–99)
Glucose-Capillary: 134 mg/dL — ABNORMAL HIGH (ref 70–99)
Glucose-Capillary: 141 mg/dL — ABNORMAL HIGH (ref 70–99)
Glucose-Capillary: 141 mg/dL — ABNORMAL HIGH (ref 70–99)
Glucose-Capillary: 171 mg/dL — ABNORMAL HIGH (ref 70–99)

## 2011-01-09 LAB — TYPE AND SCREEN
ABO/RH(D): A POS
Antibody Screen: NEGATIVE

## 2011-01-09 LAB — URINALYSIS, ROUTINE W REFLEX MICROSCOPIC
Bilirubin Urine: NEGATIVE
Glucose, UA: NEGATIVE mg/dL
Hgb urine dipstick: NEGATIVE
Ketones, ur: NEGATIVE mg/dL
Nitrite: NEGATIVE
Protein, ur: NEGATIVE mg/dL
Specific Gravity, Urine: 1.022 (ref 1.005–1.030)
Urobilinogen, UA: 0.2 mg/dL (ref 0.0–1.0)
pH: 5 (ref 5.0–8.0)

## 2011-01-09 LAB — ABO/RH: ABO/RH(D): A POS

## 2011-01-09 LAB — DIFFERENTIAL
Basophils Absolute: 0.1 10*3/uL (ref 0.0–0.1)
Basophils Relative: 1 % (ref 0–1)
Eosinophils Absolute: 0.4 10*3/uL (ref 0.0–0.7)
Eosinophils Relative: 4 % (ref 0–5)
Lymphocytes Relative: 34 % (ref 12–46)
Lymphs Abs: 2.8 10*3/uL (ref 0.7–4.0)
Monocytes Absolute: 0.7 10*3/uL (ref 0.1–1.0)
Monocytes Relative: 8 % (ref 3–12)
Neutro Abs: 4.4 10*3/uL (ref 1.7–7.7)
Neutrophils Relative %: 53 % (ref 43–77)

## 2011-01-09 LAB — COMPREHENSIVE METABOLIC PANEL
ALT: 20 U/L (ref 0–35)
AST: 26 U/L (ref 0–37)
Albumin: 3.9 g/dL (ref 3.5–5.2)
Alkaline Phosphatase: 71 U/L (ref 39–117)
BUN: 21 mg/dL (ref 6–23)
CO2: 27 mEq/L (ref 19–32)
Calcium: 9.1 mg/dL (ref 8.4–10.5)
Chloride: 108 mEq/L (ref 96–112)
Creatinine, Ser: 1.75 mg/dL — ABNORMAL HIGH (ref 0.4–1.2)
GFR calc Af Amer: 36 mL/min — ABNORMAL LOW (ref >60)
GFR calc non Af Amer: 30 mL/min — ABNORMAL LOW (ref >60)
Glucose, Bld: 94 mg/dL (ref 70–99)
Potassium: 4.7 mEq/L (ref 3.5–5.1)
Sodium: 139 mEq/L (ref 135–145)
Total Bilirubin: 0.3 mg/dL (ref 0.3–1.2)
Total Protein: 7 g/dL (ref 6.0–8.3)

## 2011-01-09 LAB — HEMOGLOBIN AND HEMATOCRIT, BLOOD
HCT: 21.3 % — ABNORMAL LOW (ref 36.0–46.0)
HCT: 24.2 % — ABNORMAL LOW (ref 36.0–46.0)
Hemoglobin: 7.2 g/dL — ABNORMAL LOW (ref 12.0–15.0)
Hemoglobin: 8.2 g/dL — ABNORMAL LOW (ref 12.0–15.0)

## 2011-01-09 LAB — BASIC METABOLIC PANEL
BUN: 16 mg/dL (ref 6–23)
BUN: 18 mg/dL (ref 6–23)
CO2: 23 mEq/L (ref 19–32)
CO2: 24 mEq/L (ref 19–32)
Calcium: 7.7 mg/dL — ABNORMAL LOW (ref 8.4–10.5)
Calcium: 7.7 mg/dL — ABNORMAL LOW (ref 8.4–10.5)
Chloride: 109 mEq/L (ref 96–112)
Chloride: 111 mEq/L (ref 96–112)
Creatinine, Ser: 1.54 mg/dL — ABNORMAL HIGH (ref 0.4–1.2)
Creatinine, Ser: 1.54 mg/dL — ABNORMAL HIGH (ref 0.4–1.2)
GFR calc Af Amer: 42 mL/min — ABNORMAL LOW (ref >60)
GFR calc Af Amer: 42 mL/min — ABNORMAL LOW (ref >60)
GFR calc non Af Amer: 35 mL/min — ABNORMAL LOW (ref >60)
GFR calc non Af Amer: 35 mL/min — ABNORMAL LOW (ref >60)
Glucose, Bld: 122 mg/dL — ABNORMAL HIGH (ref 70–99)
Glucose, Bld: 126 mg/dL — ABNORMAL HIGH (ref 70–99)
Potassium: 4.3 mEq/L (ref 3.5–5.1)
Potassium: 4.4 mEq/L (ref 3.5–5.1)
Sodium: 139 mEq/L (ref 135–145)
Sodium: 140 mEq/L (ref 135–145)

## 2011-01-09 LAB — PREPARE RBC (CROSSMATCH)

## 2011-01-11 ENCOUNTER — Emergency Department (HOSPITAL_COMMUNITY)
Admission: EM | Admit: 2011-01-11 | Discharge: 2011-01-11 | Disposition: A | Discharge status: Home / Self Care | Payer: BC Managed Care – PPO | Attending: Emergency Medicine | Admitting: Emergency Medicine

## 2011-01-11 ENCOUNTER — Emergency Department (HOSPITAL_COMMUNITY): Payer: BC Managed Care – PPO

## 2011-01-11 DIAGNOSIS — I10 Essential (primary) hypertension: Secondary | ICD-10-CM | POA: Insufficient documentation

## 2011-01-11 DIAGNOSIS — E119 Type 2 diabetes mellitus without complications: Secondary | ICD-10-CM | POA: Insufficient documentation

## 2011-01-11 DIAGNOSIS — L02419 Cutaneous abscess of limb, unspecified: Secondary | ICD-10-CM | POA: Insufficient documentation

## 2011-01-11 DIAGNOSIS — M25579 Pain in unspecified ankle and joints of unspecified foot: Secondary | ICD-10-CM | POA: Insufficient documentation

## 2011-01-11 LAB — DIFFERENTIAL
Basophils Absolute: 0.1 10*3/uL (ref 0.0–0.1)
Basophils Relative: 0 % (ref 0–1)
Eosinophils Absolute: 0.1 10*3/uL (ref 0.0–0.7)
Eosinophils Relative: 1 % (ref 0–5)
Lymphocytes Relative: 25 % (ref 12–46)
Lymphs Abs: 3.3 10*3/uL (ref 0.7–4.0)
Monocytes Absolute: 1.4 10*3/uL — ABNORMAL HIGH (ref 0.1–1.0)
Monocytes Relative: 11 % (ref 3–12)
Neutro Abs: 8.1 10*3/uL — ABNORMAL HIGH (ref 1.7–7.7)
Neutrophils Relative %: 63 % (ref 43–77)

## 2011-01-11 LAB — CBC
HCT: 35.3 % — ABNORMAL LOW (ref 36.0–46.0)
Hemoglobin: 11.2 g/dL — ABNORMAL LOW (ref 12.0–15.0)
MCH: 28.7 pg (ref 26.0–34.0)
MCHC: 31.7 g/dL (ref 30.0–36.0)
MCV: 90.5 fL (ref 78.0–100.0)
Platelets: 303 10*3/uL (ref 150–400)
RBC: 3.9 MIL/uL (ref 3.87–5.11)
RDW: 17 % — ABNORMAL HIGH (ref 11.5–15.5)
WBC: 12.9 10*3/uL — ABNORMAL HIGH (ref 4.0–10.5)

## 2011-01-11 LAB — POCT I-STAT, CHEM 8
BUN: 33 mg/dL — ABNORMAL HIGH (ref 6–23)
Calcium, Ion: 0.99 mmol/L — ABNORMAL LOW (ref 1.12–1.32)
Chloride: 105 mEq/L (ref 96–112)
Creatinine, Ser: 2.3 mg/dL — ABNORMAL HIGH (ref 0.4–1.2)
Glucose, Bld: 108 mg/dL — ABNORMAL HIGH (ref 70–99)
HCT: 37 % (ref 36.0–46.0)
Hemoglobin: 12.6 g/dL (ref 12.0–15.0)
Potassium: 4.3 mEq/L (ref 3.5–5.1)
Sodium: 136 mEq/L (ref 135–145)
TCO2: 23 mmol/L (ref 0–100)

## 2011-01-12 ENCOUNTER — Ambulatory Visit (HOSPITAL_COMMUNITY)
Admission: RE | Admit: 2011-01-12 | Discharge: 2011-01-12 | Disposition: A | Discharge status: Home / Self Care | Payer: BC Managed Care – PPO | Source: Ambulatory Visit | Attending: Emergency Medicine | Admitting: Emergency Medicine

## 2011-01-12 ENCOUNTER — Other Ambulatory Visit: Payer: Self-pay | Admitting: Emergency Medicine

## 2011-01-12 DIAGNOSIS — M7989 Other specified soft tissue disorders: Secondary | ICD-10-CM | POA: Insufficient documentation

## 2011-01-12 DIAGNOSIS — M79609 Pain in unspecified limb: Secondary | ICD-10-CM

## 2011-01-13 ENCOUNTER — Emergency Department (HOSPITAL_COMMUNITY)
Admission: EM | Admit: 2011-01-13 | Discharge: 2011-01-13 | Disposition: A | Discharge status: Home / Self Care | Payer: BC Managed Care – PPO | Attending: Emergency Medicine | Admitting: Emergency Medicine

## 2011-01-13 DIAGNOSIS — L02419 Cutaneous abscess of limb, unspecified: Secondary | ICD-10-CM | POA: Insufficient documentation

## 2011-01-13 DIAGNOSIS — I10 Essential (primary) hypertension: Secondary | ICD-10-CM | POA: Insufficient documentation

## 2011-01-13 DIAGNOSIS — F329 Major depressive disorder, single episode, unspecified: Secondary | ICD-10-CM | POA: Insufficient documentation

## 2011-01-13 DIAGNOSIS — R509 Fever, unspecified: Secondary | ICD-10-CM | POA: Insufficient documentation

## 2011-01-13 DIAGNOSIS — M25473 Effusion, unspecified ankle: Secondary | ICD-10-CM | POA: Insufficient documentation

## 2011-01-13 DIAGNOSIS — F3289 Other specified depressive episodes: Secondary | ICD-10-CM | POA: Insufficient documentation

## 2011-01-13 DIAGNOSIS — M129 Arthropathy, unspecified: Secondary | ICD-10-CM | POA: Insufficient documentation

## 2011-01-13 DIAGNOSIS — M25476 Effusion, unspecified foot: Secondary | ICD-10-CM | POA: Insufficient documentation

## 2011-01-13 DIAGNOSIS — M25579 Pain in unspecified ankle and joints of unspecified foot: Secondary | ICD-10-CM | POA: Insufficient documentation

## 2011-01-13 DIAGNOSIS — E7800 Pure hypercholesterolemia, unspecified: Secondary | ICD-10-CM | POA: Insufficient documentation

## 2011-01-13 DIAGNOSIS — E119 Type 2 diabetes mellitus without complications: Secondary | ICD-10-CM | POA: Insufficient documentation

## 2011-01-13 DIAGNOSIS — R51 Headache: Secondary | ICD-10-CM | POA: Insufficient documentation

## 2011-01-13 LAB — CBC
HCT: 33.9 % — ABNORMAL LOW (ref 36.0–46.0)
Hemoglobin: 11.4 g/dL — ABNORMAL LOW (ref 12.0–15.0)
MCH: 29.3 pg (ref 26.0–34.0)
MCHC: 33.6 g/dL (ref 30.0–36.0)
MCV: 87.1 fL (ref 78.0–100.0)
Platelets: 366 10*3/uL (ref 150–400)
RBC: 3.89 MIL/uL (ref 3.87–5.11)
RDW: 16.8 % — ABNORMAL HIGH (ref 11.5–15.5)
WBC: 10.7 10*3/uL — ABNORMAL HIGH (ref 4.0–10.5)

## 2011-01-13 LAB — DIFFERENTIAL
Basophils Absolute: 0.1 10*3/uL (ref 0.0–0.1)
Basophils Relative: 1 % (ref 0–1)
Eosinophils Absolute: 0.1 10*3/uL (ref 0.0–0.7)
Eosinophils Relative: 1 % (ref 0–5)
Lymphocytes Relative: 25 % (ref 12–46)
Lymphs Abs: 2.6 10*3/uL (ref 0.7–4.0)
Monocytes Absolute: 1.2 10*3/uL — ABNORMAL HIGH (ref 0.1–1.0)
Monocytes Relative: 11 % (ref 3–12)
Neutro Abs: 6.7 10*3/uL (ref 1.7–7.7)
Neutrophils Relative %: 63 % (ref 43–77)

## 2011-01-15 LAB — OVA AND PARASITE EXAMINATION: Ova and parasites: NONE SEEN

## 2011-01-15 LAB — STOOL CULTURE

## 2011-01-15 LAB — BASIC METABOLIC PANEL
BUN: 16 mg/dL (ref 6–23)
CO2: 18 mEq/L — ABNORMAL LOW (ref 19–32)
Calcium: 8.2 mg/dL — ABNORMAL LOW (ref 8.4–10.5)
Chloride: 118 mEq/L — ABNORMAL HIGH (ref 96–112)
Creatinine, Ser: 1.35 mg/dL — ABNORMAL HIGH (ref 0.4–1.2)
GFR calc Af Amer: 49 mL/min — ABNORMAL LOW (ref >60)
GFR calc non Af Amer: 40 mL/min — ABNORMAL LOW (ref >60)
Glucose, Bld: 86 mg/dL (ref 70–99)
Potassium: 3.6 mEq/L (ref 3.5–5.1)
Sodium: 140 mEq/L (ref 135–145)

## 2011-01-15 LAB — PROTIME-INR
INR: 1.1 (ref 0.00–1.49)
Prothrombin Time: 14.1 seconds (ref 11.6–15.2)

## 2011-01-15 LAB — HEMOGLOBIN AND HEMATOCRIT, BLOOD
HCT: 32.9 % — ABNORMAL LOW (ref 36.0–46.0)
HCT: 33.8 % — ABNORMAL LOW (ref 36.0–46.0)
HCT: 34 % — ABNORMAL LOW (ref 36.0–46.0)
HCT: 34.7 % — ABNORMAL LOW (ref 36.0–46.0)
HCT: 35.8 % — ABNORMAL LOW (ref 36.0–46.0)
HCT: 36.4 % (ref 36.0–46.0)
Hemoglobin: 10.6 g/dL — ABNORMAL LOW (ref 12.0–15.0)
Hemoglobin: 11 g/dL — ABNORMAL LOW (ref 12.0–15.0)
Hemoglobin: 11 g/dL — ABNORMAL LOW (ref 12.0–15.0)
Hemoglobin: 11.3 g/dL — ABNORMAL LOW (ref 12.0–15.0)
Hemoglobin: 11.5 g/dL — ABNORMAL LOW (ref 12.0–15.0)
Hemoglobin: 11.7 g/dL — ABNORMAL LOW (ref 12.0–15.0)

## 2011-01-15 LAB — CULTURE, BLOOD (ROUTINE X 2)
Culture: NO GROWTH
Culture: NO GROWTH

## 2011-01-15 LAB — CBC
HCT: 33 % — ABNORMAL LOW (ref 36.0–46.0)
HCT: 33.8 % — ABNORMAL LOW (ref 36.0–46.0)
HCT: 43.5 % (ref 36.0–46.0)
Hemoglobin: 10.8 g/dL — ABNORMAL LOW (ref 12.0–15.0)
Hemoglobin: 10.8 g/dL — ABNORMAL LOW (ref 12.0–15.0)
Hemoglobin: 14.1 g/dL (ref 12.0–15.0)
MCHC: 31.8 g/dL (ref 30.0–36.0)
MCHC: 32.4 g/dL (ref 30.0–36.0)
MCHC: 32.8 g/dL (ref 30.0–36.0)
MCV: 92.6 fL (ref 78.0–100.0)
MCV: 93.6 fL (ref 78.0–100.0)
MCV: 94 fL (ref 78.0–100.0)
Platelets: 237 10*3/uL (ref 150–400)
Platelets: 255 10*3/uL (ref 150–400)
Platelets: 331 10*3/uL (ref 150–400)
RBC: 3.56 MIL/uL — ABNORMAL LOW (ref 3.87–5.11)
RBC: 3.6 MIL/uL — ABNORMAL LOW (ref 3.87–5.11)
RBC: 4.65 MIL/uL (ref 3.87–5.11)
RDW: 16.9 % — ABNORMAL HIGH (ref 11.5–15.5)
RDW: 17.2 % — ABNORMAL HIGH (ref 11.5–15.5)
RDW: 17.5 % — ABNORMAL HIGH (ref 11.5–15.5)
WBC: 14.6 10*3/uL — ABNORMAL HIGH (ref 4.0–10.5)
WBC: 4.5 10*3/uL (ref 4.0–10.5)
WBC: 8 10*3/uL (ref 4.0–10.5)

## 2011-01-15 LAB — URINALYSIS, ROUTINE W REFLEX MICROSCOPIC
Bilirubin Urine: NEGATIVE
Glucose, UA: NEGATIVE mg/dL
Hgb urine dipstick: NEGATIVE
Ketones, ur: NEGATIVE mg/dL
Nitrite: NEGATIVE
Protein, ur: NEGATIVE mg/dL
Specific Gravity, Urine: 1.015 (ref 1.005–1.030)
Urobilinogen, UA: 0.2 mg/dL (ref 0.0–1.0)
pH: 5 (ref 5.0–8.0)

## 2011-01-15 LAB — COMPREHENSIVE METABOLIC PANEL
ALT: 36 U/L — ABNORMAL HIGH (ref 0–35)
ALT: 45 U/L — ABNORMAL HIGH (ref 0–35)
AST: 71 U/L — ABNORMAL HIGH (ref 0–37)
AST: 74 U/L — ABNORMAL HIGH (ref 0–37)
Albumin: 3.3 g/dL — ABNORMAL LOW (ref 3.5–5.2)
Albumin: 4.9 g/dL (ref 3.5–5.2)
Alkaline Phosphatase: 50 U/L (ref 39–117)
Alkaline Phosphatase: 78 U/L (ref 39–117)
BUN: 19 mg/dL (ref 6–23)
BUN: 29 mg/dL — ABNORMAL HIGH (ref 6–23)
CO2: 20 mEq/L (ref 19–32)
CO2: 20 mEq/L (ref 19–32)
Calcium: 7.8 mg/dL — ABNORMAL LOW (ref 8.4–10.5)
Calcium: 9.6 mg/dL (ref 8.4–10.5)
Chloride: 103 mEq/L (ref 96–112)
Chloride: 112 mEq/L (ref 96–112)
Creatinine, Ser: 1.59 mg/dL — ABNORMAL HIGH (ref 0.4–1.2)
Creatinine, Ser: 1.93 mg/dL — ABNORMAL HIGH (ref 0.4–1.2)
GFR calc Af Amer: 32 mL/min — ABNORMAL LOW (ref >60)
GFR calc Af Amer: 40 mL/min — ABNORMAL LOW (ref >60)
GFR calc non Af Amer: 27 mL/min — ABNORMAL LOW (ref >60)
GFR calc non Af Amer: 33 mL/min — ABNORMAL LOW (ref >60)
Glucose, Bld: 141 mg/dL — ABNORMAL HIGH (ref 70–99)
Glucose, Bld: 95 mg/dL (ref 70–99)
Potassium: 3.8 mEq/L (ref 3.5–5.1)
Potassium: 4.8 mEq/L (ref 3.5–5.1)
Sodium: 137 mEq/L (ref 135–145)
Sodium: 137 mEq/L (ref 135–145)
Total Bilirubin: 0.5 mg/dL (ref 0.3–1.2)
Total Bilirubin: 0.5 mg/dL (ref 0.3–1.2)
Total Protein: 6.6 g/dL (ref 6.0–8.3)
Total Protein: 9.5 g/dL — ABNORMAL HIGH (ref 6.0–8.3)

## 2011-01-15 LAB — GLUCOSE, CAPILLARY
Glucose-Capillary: 101 mg/dL — ABNORMAL HIGH (ref 70–99)
Glucose-Capillary: 103 mg/dL — ABNORMAL HIGH (ref 70–99)
Glucose-Capillary: 104 mg/dL — ABNORMAL HIGH (ref 70–99)
Glucose-Capillary: 104 mg/dL — ABNORMAL HIGH (ref 70–99)
Glucose-Capillary: 105 mg/dL — ABNORMAL HIGH (ref 70–99)
Glucose-Capillary: 153 mg/dL — ABNORMAL HIGH (ref 70–99)
Glucose-Capillary: 80 mg/dL (ref 70–99)
Glucose-Capillary: 82 mg/dL (ref 70–99)
Glucose-Capillary: 94 mg/dL (ref 70–99)
Glucose-Capillary: 95 mg/dL (ref 70–99)

## 2011-01-15 LAB — URINE CULTURE: Colony Count: 45000

## 2011-01-15 LAB — DIFFERENTIAL
Basophils Absolute: 0 10*3/uL (ref 0.0–0.1)
Basophils Relative: 0 % (ref 0–1)
Eosinophils Absolute: 0.1 10*3/uL (ref 0.0–0.7)
Eosinophils Relative: 1 % (ref 0–5)
Lymphocytes Relative: 5 % — ABNORMAL LOW (ref 12–46)
Lymphs Abs: 0.7 10*3/uL (ref 0.7–4.0)
Monocytes Absolute: 0.6 10*3/uL (ref 0.1–1.0)
Monocytes Relative: 4 % (ref 3–12)
Neutro Abs: 13.2 10*3/uL — ABNORMAL HIGH (ref 1.7–7.7)
Neutrophils Relative %: 91 % — ABNORMAL HIGH (ref 43–77)

## 2011-01-15 LAB — CARDIAC PANEL(CRET KIN+CKTOT+MB+TROPI)
CK, MB: 10.6 ng/mL (ref 0.3–4.0)
CK, MB: 8.5 ng/mL (ref 0.3–4.0)
CK, MB: 9.1 ng/mL (ref 0.3–4.0)
Relative Index: 0.8 (ref 0.0–2.5)
Relative Index: 1 (ref 0.0–2.5)
Relative Index: 1.3 (ref 0.0–2.5)
Total CK: 1405 U/L — ABNORMAL HIGH (ref 7–177)
Total CK: 675 U/L — ABNORMAL HIGH (ref 7–177)
Total CK: 914 U/L — ABNORMAL HIGH (ref 7–177)
Troponin I: 0.01 ng/mL (ref 0.00–0.06)
Troponin I: 0.03 ng/mL (ref 0.00–0.06)
Troponin I: 0.03 ng/mL (ref 0.00–0.06)

## 2011-01-15 LAB — CLOSTRIDIUM DIFFICILE EIA
C difficile Toxins A+B, EIA: NEGATIVE
C difficile Toxins A+B, EIA: NEGATIVE

## 2011-01-15 LAB — LIPASE, BLOOD: Lipase: 88 U/L — ABNORMAL HIGH (ref 11–59)

## 2011-01-15 LAB — CK TOTAL AND CKMB (NOT AT ARMC)
CK, MB: 3.9 ng/mL (ref 0.3–4.0)
Relative Index: 1.5 (ref 0.0–2.5)
Total CK: 266 U/L — ABNORMAL HIGH (ref 7–177)

## 2011-01-15 LAB — URINE MICROSCOPIC-ADD ON

## 2011-01-15 LAB — APTT: aPTT: 31 seconds (ref 24–37)

## 2011-01-15 LAB — LIPID PANEL
Cholesterol: 137 mg/dL (ref 0–200)
HDL: 58 mg/dL (ref >39)
LDL Cholesterol: 71 mg/dL (ref 0–99)
Total CHOL/HDL Ratio: 2.4 RATIO
Triglycerides: 42 mg/dL (ref <150)
VLDL: 8 mg/dL (ref 0–40)

## 2011-01-15 LAB — HEMOCCULT GUIAC POC 1CARD (OFFICE)
Fecal Occult Bld: NEGATIVE
Fecal Occult Bld: POSITIVE
Fecal Occult Bld: POSITIVE

## 2011-01-15 LAB — FECAL LACTOFERRIN, QUANT: Fecal Lactoferrin: NEGATIVE

## 2011-01-15 LAB — HEMOGLOBIN A1C
Hgb A1c MFr Bld: 6.2 % — ABNORMAL HIGH (ref 4.6–6.1)
Mean Plasma Glucose: 131 mg/dL

## 2011-01-15 LAB — TROPONIN I: Troponin I: <0.01 ng/mL (ref 0.00–0.06)

## 2011-01-18 ENCOUNTER — Encounter: Payer: Self-pay | Admitting: Cardiology

## 2011-01-19 ENCOUNTER — Ambulatory Visit (INDEPENDENT_AMBULATORY_CARE_PROVIDER_SITE_OTHER): Payer: BC Managed Care – PPO | Admitting: Cardiology

## 2011-01-19 ENCOUNTER — Other Ambulatory Visit: Payer: Self-pay | Admitting: Internal Medicine

## 2011-01-19 ENCOUNTER — Encounter: Payer: Self-pay | Admitting: Cardiology

## 2011-01-19 DIAGNOSIS — I10 Essential (primary) hypertension: Secondary | ICD-10-CM

## 2011-01-19 DIAGNOSIS — R0600 Dyspnea, unspecified: Secondary | ICD-10-CM | POA: Insufficient documentation

## 2011-01-19 DIAGNOSIS — R0989 Other specified symptoms and signs involving the circulatory and respiratory systems: Secondary | ICD-10-CM

## 2011-01-19 DIAGNOSIS — R0609 Other forms of dyspnea: Secondary | ICD-10-CM

## 2011-01-19 DIAGNOSIS — E785 Hyperlipidemia, unspecified: Secondary | ICD-10-CM

## 2011-01-19 DIAGNOSIS — R079 Chest pain, unspecified: Secondary | ICD-10-CM

## 2011-01-19 LAB — BRAIN NATRIURETIC PEPTIDE: Pro B Natriuretic peptide (BNP): 8.9 pg/mL (ref 0.0–100.0)

## 2011-01-19 NOTE — Assessment & Plan Note (Signed)
Patient has atypical chest pain but also gets exertional dyspnea and has multiple CAD risk factors including diabetes.  I think that it would be reasonable to risk stratify her with Steffanie Dunn (would be unable to walk on treadmill due to back pain).  She should continue ASA 81 and statin.

## 2011-01-19 NOTE — Progress Notes (Signed)
60 yo with history of DM, HTN, hyperlipidemia, CKD, and chronic back pain with spinal stenosis presents for cardiology evaluation.  Patient has been getting periodic episodes of sharp chest pain that are unrelated to exertion.  Pain will be sharp and last less than 1 minute.  No radiation.  This will occur 2-3 times a month.  She also has had chronic exertional dyspnea.  Patient is short of breath walking around in stores, going up steps, or doing housework.  She can walk around her house without problems.  The exertional dyspnea has been present for a number of years.  She has been taking Lasix for some time now.  No orthopnea/PND, no syncope/palpitations.    Patient is being treated currently with doxycycline for right lower leg cellulitis.  Her right ankle is swollen from the cellulitis.   Labs (3/12): K 4.7, creatinine 1.85  ECG: NSR, left axis deviation  PMH: 1. DM2 2. HTN 3. Bipolar disorder 4. CKD stage III 5. Anemia of renal disease 6. Hyperlipidemia 7. Spinal stenosis/lumbar spine arthritis s/p surgery in 2003 and in 5/11  SH: Quit smoking in 2004.  Lives alone.  Disabled due to back pain and bipolar disorder.  No ETOH.  FH: Mother with MI at 24, father with COPD, brother with MI at 35  ROS: All systems reviewed and negative except as per HPI.   Current Outpatient Prescriptions  Medication Sig Dispense Refill  . aspirin 81 MG chewable tablet Chew 81 mg by mouth daily.        Marland Kitchen docusate sodium (COLACE) 100 MG capsule Take 100 mg by mouth 2 (two) times daily.        . DULoxetine (CYMBALTA) 30 MG capsule Take 30 mg by mouth daily.        . DULoxetine (CYMBALTA) 60 MG capsule Take 60 mg by mouth daily. For a total of 90 mg a day       . fluocinonide (LIDEX) 0.05 % cream Apply topically 2 (two) times daily.        . furosemide (LASIX) 20 MG tablet Take 1 tablet (20 mg total) by mouth daily. 4-7 days or as directed  30 tablet  1  . glucose blood (ACCU-CHEK AVIVA) test strip 1 each  by Other route as needed. Use as instructed       . Lancets (ACCU-CHEK MULTICLIX) lancets 1 each by Other route as needed. Use as instructed       . omeprazole (PRILOSEC) 20 MG capsule Take 20 mg by mouth daily.        . pioglitazone (ACTOS) 30 MG tablet Take 30 mg by mouth daily.        . pregabalin (LYRICA) 75 MG capsule Take 75 mg by mouth 2 (two) times daily.        . Probiotic Product (PRO-FLORA CONCENTRATE) CAPS Take 1 capsule by mouth daily. For bowels       . promethazine (PHENERGAN) 25 MG tablet Take 25 mg by mouth every 6 (six) hours as needed.        Marland Kitchen QUEtiapine (SEROQUEL) 100 MG tablet Take 100 mg by mouth at bedtime. 2 by mouth once daily      . rosuvastatin (CRESTOR) 10 MG tablet Take 10 mg by mouth daily.        . temazepam (RESTORIL) 7.5 MG capsule Take 7.5 mg by mouth at bedtime as needed. Unsure of dosage       . vitamin B-12 (CYANOCOBALAMIN) 100 MCG tablet  Take 50 mcg by mouth daily.        . vitamin E (VITAMIN E) 400 UNIT capsule Take 400 Units by mouth daily.        . vitamin E 600 UNIT capsule Take 600 Units by mouth daily.        Marland Kitchen DISCONTD: Ferrous Fumarate (IRON) 18 MG TBCR Take by mouth.        . DISCONTD: l-methylfolate-B6-B12 (METANX) 3-35-2 MG TABS Take 1 tablet by mouth.          BP 114/75  Pulse 92  Ht 5\' 4"  (1.626 m)  Wt 184 lb 8 oz (83.689 kg)  BMI 31.67 kg/m2 General: NAD, overweight. Neck: No JVD, no thyromegaly or thyroid nodule.  Lungs: Clear to auscultation bilaterally with normal respiratory effort. CV: Nondisplaced PMI.  Heart regular S1/S2, no S3/S4, no murmur.  No carotid bruit.  Normal pedal pulses.  1+ edema at right ankle (has been treated for cellulitis at this location).  Abdomen: Soft, nontender, no hepatosplenomegaly, no distention.  Skin: Mild erythema right ankle (treating cellulitis currently).  Neurologic: Alert and oriented x 3.  Psych: Normal affect. Extremities: No clubbing or cyanosis.  HEENT: Normal.

## 2011-01-19 NOTE — Assessment & Plan Note (Signed)
Would aim for goal LDL < 100 with diabetes.

## 2011-01-19 NOTE — Assessment & Plan Note (Signed)
Patient has chronic exertional dyspnea, NYHA class IIIa symptoms.  She may have a degree of diastolic CHF with a tendency toward fluid retention caused by CKD.  Will get Lexiscan myoview to rule out ischemic diastolic dysfunction.  She is on Lasix and appears euvolemic on exam.  Will get BNP and echocardiogram to assess LV and RV function and PA pressure.

## 2011-01-19 NOTE — Patient Instructions (Signed)
Lab today--BNP 786.50  786.09  Please schedule an appt for an echocardiogram.  Please schedule an appt for a lexiscan myoview. See instruction sheet given to you today.  Please schedule an appt with Dr Shirlee Latch in 2-3 weeks after the testing has been completed.

## 2011-01-19 NOTE — Assessment & Plan Note (Signed)
Patient is only on Lasix.  BP seems under control.

## 2011-01-31 ENCOUNTER — Ambulatory Visit (HOSPITAL_COMMUNITY): Payer: BC Managed Care – PPO | Attending: Cardiology

## 2011-01-31 ENCOUNTER — Ambulatory Visit (HOSPITAL_BASED_OUTPATIENT_CLINIC_OR_DEPARTMENT_OTHER): Payer: BC Managed Care – PPO | Admitting: Radiology

## 2011-01-31 DIAGNOSIS — E119 Type 2 diabetes mellitus without complications: Secondary | ICD-10-CM

## 2011-01-31 DIAGNOSIS — R0609 Other forms of dyspnea: Secondary | ICD-10-CM

## 2011-01-31 DIAGNOSIS — I379 Nonrheumatic pulmonary valve disorder, unspecified: Secondary | ICD-10-CM | POA: Insufficient documentation

## 2011-01-31 DIAGNOSIS — R079 Chest pain, unspecified: Secondary | ICD-10-CM

## 2011-01-31 DIAGNOSIS — R0989 Other specified symptoms and signs involving the circulatory and respiratory systems: Secondary | ICD-10-CM

## 2011-01-31 DIAGNOSIS — I10 Essential (primary) hypertension: Secondary | ICD-10-CM | POA: Insufficient documentation

## 2011-01-31 DIAGNOSIS — I059 Rheumatic mitral valve disease, unspecified: Secondary | ICD-10-CM | POA: Insufficient documentation

## 2011-01-31 DIAGNOSIS — R0600 Dyspnea, unspecified: Secondary | ICD-10-CM

## 2011-01-31 DIAGNOSIS — I079 Rheumatic tricuspid valve disease, unspecified: Secondary | ICD-10-CM | POA: Insufficient documentation

## 2011-01-31 DIAGNOSIS — R072 Precordial pain: Secondary | ICD-10-CM | POA: Insufficient documentation

## 2011-01-31 DIAGNOSIS — I319 Disease of pericardium, unspecified: Secondary | ICD-10-CM | POA: Insufficient documentation

## 2011-01-31 MED ORDER — TECHNETIUM TC 99M TETROFOSMIN IV KIT
10.9000 | PACK | Freq: Once | INTRAVENOUS | Status: AC | PRN
  Administered 2011-01-31: 10.9 via INTRAVENOUS

## 2011-01-31 MED ORDER — REGADENOSON 0.4 MG/5ML IV SOLN
0.4000 mg | Freq: Once | INTRAVENOUS | Status: AC
  Administered 2011-01-31: 0.4 mg via INTRAVENOUS

## 2011-01-31 MED ORDER — TECHNETIUM TC 99M TETROFOSMIN IV KIT
33.0000 | PACK | Freq: Once | INTRAVENOUS | Status: AC | PRN
  Administered 2011-01-31: 33 via INTRAVENOUS

## 2011-01-31 NOTE — Progress Notes (Addendum)
[facility] SITE 3 NUCLEAR MED [address] [place] [address] [phone]  Cardiology Nuclear Med Study  Desiree [name] is a 60 y.o. female [id number] Dec 31, 1950   Nuclear Med Background Indication for Stress Test:  Evaluation for Ischemia History: '09 EF 65-70%  Echo and  '05 Myocardial Perfusion Study NL Cardiac Risk Factors: Family History - CAD, Hypertension, Lipids and NIDDM  Symptoms:  Chest Pain (last date of chest discomfort January 26, 2011) and DOE   Nuclear Pre-Procedure Caffeine/Decaff Intake:  None NPO After: 9:00pm   Lungs:  Clear IV 0.9% NS with Angio Cath:  20g  IV Site: R Wrist  IV Started by:  Stanton Kidney, EMT-P  Chest Size (in):  40 Cup Size: C  Height: 5\' 4"  (1.626 m)  Weight:  189 lb (85.73 kg)  BMI:  Body mass index is 32.44 kg/(m^2). Tech Comments: After the patient finished with the stress portion of the test, she still had chest tightness. After completing the test she still had chest tightness. (5/10) The patient was then placed back on the monitor showing s-tach. Patient given 1 sublingual Nitro. In approx. 15-20 minutes her chest tightness was relieved. The patient was sent home and advised to call 911 if the chest tightness returned. S.Williams EMTP    Nuclear Med Study 1 or 2 day study: 1 day  Stress Test Type:  Eugenie Birks  Reading MD: Charlton Haws, MD  Order Authorizing Provider:  D.McLean  Resting Radionuclide: Technetium 72m Tetrofosmin  Resting Radionuclide Dose: 10.9 mCi   Stress Radionuclide:  Technetium 68m Tetrofosmin  Stress Radionuclide Dose: 33.0 mCi           Stress Protocol Rest HR: 86  STRESS HR: 120  Rest BP: 119/77 Stress BP: 117/63  Exercise Time (min): n/a METS: n/a   Predicted Max HR: 162 bpm % Max HR: 74.07 bpm Rate Pressure Product: 21308   Dose of Adenosine (mg):  n/a Dose of Lexiscan: 0.4 mg  Dose of Atropine (mg): n/a Dose of Dobutamine: n/a mcg/kg/min (at max HR)  Stress Test Technologist:  Milana Na, EMT-P  Nuclear Technologist:  Doyne Keel, CNMT     Rest Procedure:  Myocardial perfusion imaging was performed at rest 45 minutes following the intravenous administration of Technetium 24m Tetrofosmin. Rest ECG: NSR  Stress Procedure:  The patient received IV Lexiscan 0.4 mg over 15-seconds.  Technetium 60m Tetrofosmin injected at 30-seconds.  This patient was very symptomatic with SOB, Lt. Headed, Nausea, Headache, and chest tightness. There were no significant changes with Lexiscan.  Quantitative spect images were obtained after a 45 minute delay. Stress ECG: No significant change from baseline ECG  QPS Raw Data Images:  Normal; no motion artifact; normal heart/lung ratio. Stress Images:  Normal homogeneous uptake in all areas of the myocardium. Rest Images:  Normal homogeneous uptake in all areas of the myocardium. Subtraction (SDS):  Normal Transient Ischemic Dilatation (Normal <1.22):  0.90 Lung/Heart Ratio (Normal <0.45):  0.27  Quantitative Gated Spect Images QGS EDV:  56 ml QGS ESV:  14 ml QGS cine images:  NL LV Function; NL Wall Motion QGS EF: 75%  Impression Exercise Capacity:  Lexiscan with no exercise. BP Response:  Normal blood pressure response. Clinical Symptoms:  There is dyspnea. ECG Impression:  No significant ST segment change suggestive of ischemia. Comparison with Prior Nuclear Study: No images to compare  Overall Impression:  Normal stress nuclear study. and With stress there is no EKG change.  Signed by Dario Guardian on 01/31/2011 at 3:21 PM.   Charlton Haws   Normal [place].  Echo showed moderate LV hypertrophy and normal LV systolic function. [employer]

## 2011-02-01 ENCOUNTER — Other Ambulatory Visit: Payer: Self-pay | Admitting: Internal Medicine

## 2011-02-01 NOTE — Progress Notes (Signed)
Pt given results by telephone 

## 2011-02-02 ENCOUNTER — Encounter (HOSPITAL_COMMUNITY): Payer: Self-pay | Admitting: Cardiology

## 2011-02-07 ENCOUNTER — Ambulatory Visit (INDEPENDENT_AMBULATORY_CARE_PROVIDER_SITE_OTHER): Payer: BC Managed Care – PPO | Admitting: Cardiology

## 2011-02-07 ENCOUNTER — Encounter: Payer: Self-pay | Admitting: Cardiology

## 2011-02-07 DIAGNOSIS — I10 Essential (primary) hypertension: Secondary | ICD-10-CM

## 2011-02-07 DIAGNOSIS — R079 Chest pain, unspecified: Secondary | ICD-10-CM

## 2011-02-07 DIAGNOSIS — R0609 Other forms of dyspnea: Secondary | ICD-10-CM

## 2011-02-07 DIAGNOSIS — R0600 Dyspnea, unspecified: Secondary | ICD-10-CM | POA: Insufficient documentation

## 2011-02-07 NOTE — Patient Instructions (Signed)
Follow-up with Dr McLean as needed. 

## 2011-02-07 NOTE — Assessment & Plan Note (Signed)
Dyspnea on exertion may be primarily due to deconditioning.  She is not very active given her back problems.  BNP was not elevated and LV systolic function was normal on echo.  Cannot rule out some diastolic dysfunction given LV hypertrophy.  She does not appear volume overloaded on exam.  No indication for diuresis.

## 2011-02-07 NOTE — Progress Notes (Signed)
PCP: Dr. Mikeal Hawthorne  60 yo with history of DM, HTN, hyperlipidemia, CKD, and chronic back pain with spinal stenosis returns for cardiology evaluation. Patient has been getting periodic episodes of sharp chest pain that are unrelated to exertion. Pain will be sharp and last less than 1 minute. No radiation. This will occur 2-3 times a month. She also has had chronic exertional dyspnea. Patient is short of breath walking around in stores, going up steps, or doing housework. She can walk around her house without problems. The exertional dyspnea has been present for a number of years. She has been taking Lasix for some time now. No orthopnea/PND, no syncope/palpitations.   After last appointment, I had patient do a Scientist, physiological.  This showed EF 75% and no evidence for ischemia or infarction.  Echo given exertional dyspnea showed moderate LV hypertrophy with normal LV systolic function.  BNP was not elevated.    Labs (3/12): K 4.7, creatinine 1.85, BNP 8.9  PMH:  1. DM2  2. HTN  3. Bipolar disorder  4. CKD stage III  5. Anemia of renal disease  6. Hyperlipidemia  7. Spinal stenosis/lumbar spine arthritis s/p surgery in 2003 and in 5/11  8. Chest pain: Atypical.  Lexiscan myoview (4/12) with EF 75%, no ischemia or infarction.  9. Exertional dyspnea: Echo (4/12) with moderate LV hypertrophy, EF 55-60%, mild mitral regurgitation.  BNP 8.9.   SH: Quit smoking in 2004. Lives alone. Disabled due to back pain and bipolar disorder. No ETOH.   FH: Mother with MI at 61, father with COPD, brother with MI at 70  Current Outpatient Prescriptions  Medication Sig Dispense Refill  . ACTOS 30 MG tablet TAKE 1 TABLET BY MOUTH EVERY DAY  30 tablet  2  . aspirin 81 MG chewable tablet Chew 81 mg by mouth daily.        . DULoxetine (CYMBALTA) 30 MG capsule Take 30 mg by mouth daily.        . DULoxetine (CYMBALTA) 60 MG capsule Take 60 mg by mouth daily. For a total of 90 mg a day       . fluocinonide (LIDEX) 0.05 %  cream Apply topically 2 (two) times daily.        . furosemide (LASIX) 20 MG tablet Take 1 tablet (20 mg total) by mouth daily. 4-7 days or as directed  30 tablet  1  . glucose blood (ACCU-CHEK AVIVA) test strip 1 each by Other route as needed. Use as instructed       . Lancets (ACCU-CHEK MULTICLIX) lancets 1 each by Other route as needed. Use as instructed       . omeprazole (PRILOSEC) 20 MG capsule Take 20 mg by mouth daily.        . pregabalin (LYRICA) 75 MG capsule Take 75 mg by mouth 2 (two) times daily.        . Probiotic Product (PRO-FLORA CONCENTRATE) CAPS Take 1 capsule by mouth daily. For bowels       . promethazine (PHENERGAN) 25 MG tablet Take 25 mg by mouth every 6 (six) hours as needed.        Marland Kitchen QUEtiapine (SEROQUEL) 100 MG tablet Take 100 mg by mouth at bedtime. 2 by mouth once daily      . rosuvastatin (CRESTOR) 10 MG tablet Take 10 mg by mouth daily.        . temazepam (RESTORIL) 7.5 MG capsule Take 7.5 mg by mouth at bedtime as needed. Unsure of  dosage       . vitamin B-12 (CYANOCOBALAMIN) 100 MCG tablet Take by mouth daily.       . vitamin E (VITAMIN E) 400 UNIT capsule Take 400 Units by mouth daily.        . vitamin E 600 UNIT capsule Take 600 Units by mouth daily.        Marland Kitchen docusate sodium (COLACE) 100 MG capsule Take 100 mg by mouth 2 (two) times daily.          BP 118/58  Pulse 100  Ht 5\' 4"  (1.626 m)  Wt 181 lb (82.101 kg)  BMI 31.07 kg/m2 General: NAD, overweight.  Neck: No JVD, no thyromegaly or thyroid nodule.  Lungs: Clear to auscultation bilaterally with normal respiratory effort.  CV: Nondisplaced PMI. Heart regular S1/S2, no S3/S4, no murmur. No carotid bruit. Normal pedal pulses. 1+ edema at right ankle (has been treated for cellulitis at this location).  Abdomen: Soft, nontender, no hepatosplenomegaly, no distention.  Neurologic: Alert and oriented x 3.  Psych: Normal affect.  Extremities: No clubbing or cyanosis.

## 2011-02-07 NOTE — Assessment & Plan Note (Signed)
Blood pressure is under good control.  Given LV hypertrophy, would monitor this closely.

## 2011-02-07 NOTE — Assessment & Plan Note (Signed)
Atypical chest pain.  Lexiscan myoview showed no evidence of ischemia or infarction.  I suspect that her pain was noncardiac.

## 2011-02-13 ENCOUNTER — Other Ambulatory Visit: Payer: Self-pay | Admitting: Cardiology

## 2011-02-13 ENCOUNTER — Other Ambulatory Visit: Payer: Self-pay | Admitting: Internal Medicine

## 2011-02-13 ENCOUNTER — Encounter (INDEPENDENT_AMBULATORY_CARE_PROVIDER_SITE_OTHER): Payer: BC Managed Care – PPO | Admitting: Cardiology

## 2011-02-13 DIAGNOSIS — M79609 Pain in unspecified limb: Secondary | ICD-10-CM

## 2011-02-13 DIAGNOSIS — M7989 Other specified soft tissue disorders: Secondary | ICD-10-CM

## 2011-02-13 DIAGNOSIS — R609 Edema, unspecified: Secondary | ICD-10-CM

## 2011-02-13 DIAGNOSIS — L539 Erythematous condition, unspecified: Secondary | ICD-10-CM

## 2011-03-04 ENCOUNTER — Other Ambulatory Visit: Payer: Self-pay | Admitting: Internal Medicine

## 2011-03-07 NOTE — Assessment & Plan Note (Signed)
[facility]                         GASTROENTEROLOGY OFFICE NOTE   Desiree [name], Desiree [name]                        MRN:          [id number]  DATE:01/09/2008                            DOB:          1951-09-02    REFERRING PHYSICIAN:  Neta Mends. [name], MD   REQUESTING PHYSICIAN:  Neta Mends. [name], MD   CHIEF COMPLAINT:  Nausea, vomiting, weight loss.   ASSESSMENT:  A 60 year old African-American woman who had a several-week  history of nausea and vomiting postprandial, seems to be improving.  She  claims a 20-pound weight loss.  She is 10 pounds less than she was when  I saw her in 2006.   She is also having some right upper and right lower quadrant pain  intermittently.  She had an MRI without contrast that was unrevealing.  She has had some renal insufficiency that is improving.  She has had  some fasting hyperglycemia, though her hemoglobin A1c has not been above  6.9%.   She has a history of functional GI disturbance, and I think that is  probably what is going on.  Gastroparesis is certainly possible.  I do  not know why her creatinine went up, but fortunately it is better, if  she was somewhat dehydrated and volume depleted related to nausea and  vomiting that may have played a role.  Perhaps, she had some sort of  illness that caused all of this, and she is improving.   RECOMMENDATIONS AND PLAN:  I think it is reasonable to exclude upper GI  malignancy or gastric outlet obstruction or ulcer disease with an EGD.  If that is unrevealing, I probably would not perform other diagnostic  testing since she is not anemic, and her chronic constipation pattern is  not different.  She had a colonoscopy and an EGD in 2006 that were  unrevealing.  She had a colon polyp in the colonoscopy in 2006 which was  hyperplastic, and there was a prior history of adenomatous colon polyps,  so she is for a five-year routine follow up colonoscopy from that time   point.   HISTORY:  This 60 year old African-American woman has problems as  outlined above.  There is an associated anorexia that seemed to be a  problem after she started the nausea and vomiting.  She vomits mainly  fluid an hour or two after eating.  There is some vague intermittent  right upper and right lower quadrant pain.  She moves her bowels about  once a week, but this is a relatively constant pattern.  Laboratory  studies as outlined above.  MR as above.  I have reviewed Dr. Rosezella Florida  recent office notes in the electronic medical record.  The last time she  vomited was one or two weeks ago she thinks.  There has been no  bleeding, fever or chills reported.  The reminder of the review of  systems is somewhat diffusely positive.  She denies any known neuropathy  or retinopathy from her diabetes, so she has had some blurry vision, and  she is going to see the eye doctor.  PAST MEDICAL HISTORY:  1. Hypertension.  2. Diabetes mellitus.  3. Anxiety.  4. Depression with psychotic features.  5. Sinus problems.  6. Adenomatous colon polyp.  7. Gastroesophageal reflux disease.  8. Mitral valve prolapse.  9. Dyslipidemia.  10.Renal insufficiency.   SURGERIES:  1. Knee surgery.  2. Low back surgery.  3. Finger surgery.   MEDICATIONS:  1. Clonazepam 1 mg at bedtime.  2. Cymbalta 30 mg 3 capsules once a day.  3. Crestor 10 mg daily.  4. Protonix generic 40 mg daily.  5. Metformin 500 mg b.i.d.  6. Triamterene/HCTZ 37.5-25 mg half tablet daily.  7. Zyprexa 10 mg daily.  8. Aspirin 81 mg daily.   She was on Celebrex and was told to stop that.   FAMILY HISTORY:  Diabetes, alcoholism, heart disease.  Reported no colon  cancer or GI problems.   SOCIAL HISTORY:  She is married.  She is disabled.  Lives with her  husband and daughter.  No alcohol, tobacco or drugs.   PHYSICAL EXAMINATION:  Reveals a well-developed, well-nourished black  woman in no acute distress.  She has a  fairly flat affect.  She moves  slowly.  Height 5 feet, 3-1/2 inches, weight 161.8 pounds.  Blood pressure  112/70, pulse 88.  The eyes are anicteric.  Mouth:  Posterior pharynx free of lesions.  NECK:  Supple without thyromegaly or mass.  CHEST:  Clear.  HEART:  S1, S2, no murmurs, rubs or gallops.  ABDOMEN:  Soft, nontender, no organomegaly or masses, no succussion  splash.  Bowel sounds are present.  There is no bruit.  LOWER EXTREMITIES:  Free of edema.  SKIN:  Warm and dry.  No acute rash.  LYMPH NODES:  No neck or supraclavicular nodes.  Cranial nerves II-XII appear grossly intact.   I appreciate the opportunity to care for this patient.     Iva Boop, MD,FACG  Electronically Signed    CEG/MedQ  DD: 01/09/2008  DT: 01/09/2008  Job #: [id number]   cc:   Neta Mends. Fabian Sharp, MD

## 2011-03-10 NOTE — Discharge Summary (Signed)
NAME:  Desiree [name], Desiree [name]                           ACCOUNT NO.:  [id number]   MEDICAL RECORD NO.:  [id number]                   PATIENT TYPE:  IPS   LOCATION:  0405                                 FACILITY:  [facility]   PHYSICIAN:  Jeanice Lim, M.D.              DATE OF BIRTH:  1951/06/13   DATE OF ADMISSION:  11/18/2003  DATE OF DISCHARGE:  11/24/2003                                 DISCHARGE SUMMARY   IDENTIFYING DATA:  This is a 60 year old married African-American female  voluntarily committed with a history of psychotic symptoms, depressed,  reportedly a danger to self, on commitment papers, reporting her things have  been misplaced, moved, hidden and she feels she is being poisoned.  Reported  olfactory hallucinations.  Reports waking up during the witching hour,  delusional.   MEDICATIONS:  Mobic, Protonix, nerve pill (which patient may not have been  compliant with and did not know the name).   ALLERGIES:  PENICILLIN and CODEINE.   PHYSICAL EXAMINATION:  Essentially within normal limits.  Neurologically  nonfocal.   LABORATORY DATA:  Routine admission labs within normal limits.   MENTAL STATUS EXAM:  Alert, cooperative.  Fair eye contact.  Casually  dressed.  Speech clear, although rambling at times.  Mood was somewhat  irritable.  Affect restricted.  Thought processes positive for paranoia,  somewhat guarded.  Olfactory hallucinations reported as well as visual  hallucinations.  Cognitively intact.  Judgment and insight poor.  Poor  historian.   ADMISSION DIAGNOSES:   AXIS I:  1. Psychotic disorder not otherwise specified.  2. Rule out major depression with psychotic features versus schizoaffective     disorder, depressed-type.   AXIS II:  Deferred.   AXIS III:  1. Gastroesophageal reflux disease.  2. Arthritis.   AXIS IV:  Moderate (problems with primary support group and economic  problems).   AXIS V:  30/55-60.   HOSPITAL COURSE:  The patient was admitted  and ordered routine p.r.n.  medications and underwent further monitoring.  Was encouraged to participate  in individual, group and milieu therapy.  Family session was requested as  well as patient was placed on the [address] due to degree of psychotic  symptoms and placed on 15-minute safety checks.  The patient felt that there  was rumors being spread about her in her neighborhood that she was having  mood swings and had a history of being on lithium and Haldol.  Had been off  of medications for some time.  Thought process was somewhat disorganized and  patient was quite paranoid.  The patient was monitored, adjusted on  medications and gradually reported a decrease in paranoid ideation and  stabilization of mood and resolution of dangerous ideation.   CONDITION ON DISCHARGE:  The patient was discharged in markedly improved  condition.  Mood was more euthymic.  Affect brighter.  The patient reported  insight into  suspicious thoughts and understood the importance of being  compliant with her medications this time in addition to aftercare follow-up.  Medication education was given, including risk/benefit ratio and alternative  treatments, specifically related to Zyprexa, metabolic issues, weight gain,  association with glucose abnormalities and lipid abnormalities and patient  felt strongly that she had a very good response to this medications and  therefore she was discharged on:   DISCHARGE MEDICATIONS:  1. Protonix 40 mg q.a.m.  2. Mobic 7.5 mg q.a.m.  3. Zyprexa Zydis 15 mg q.h.s.   FOLLOW UP:  The patient was to follow up at [facility] with Dr. Mariana Single on December 11, 2003 at 1:15 p.m.   DISCHARGE DIAGNOSES:   AXIS I:  1. Psychotic disorder not otherwise specified.  2. Rule out major depression with psychotic features versus schizoaffective     disorder, depressed-type.   AXIS II:  Deferred.   AXIS III:  1. Gastroesophageal  reflux disease.  2. Arthritis.   AXIS IV:  Moderate (problems with primary support group and economic  problems).   AXIS V:  Global Assessment of Functioning on discharge 55.                                               Jeanice Lim, M.D.    JEM/MEDQ  D:  12/19/2003  T:  12/20/2003  Job:  [id number]

## 2011-03-10 NOTE — Op Note (Signed)
[place]  Patient:    Desiree [name], Desiree [name]                        MRN: [id number] Proc. Date: 01/17/00 Adm. Date:  [id number] Attending:  Marcene Corning                           Operative Report  PREOPERATIVE DIAGNOSIS:  Right foot hallux valgus.  POSTOPERATIVE DIAGNOSIS:  Right foot hallux valgus.  PROCEDURE:  Right foot Chevron osteotomy and correction of hallux valgus.  ANESTHESIA:  Ankle block.  ATTENDING SURGEON:  Lubertha Basque. Jerl Santos, M.D.  ASSISTANT:  Prince Rome, P.A.  INDICATION FOR PROCEDURE:  The patient is a 60 year old woman with a long history of a painful deformity of her foot.  This had persisted despite special shoes and pads and she was eventually offered operative intervention.  Planned procedure as for a Chevron osteotomy.  The procedure was discussed with the patient and informed operative consent was obtained, after discussion of possible complications of reaction to anesthesia and infection.  DESCRIPTION OF PROCEDURE:  The patient was taken to the operating suite where ankle block anesthetic was applied without difficulty.  She was positioned supine and  prepped and draped in normal sterile fashion.  After the administration of preop IV antibiotics, the right leg was elevated, exsanguinated and a tourniquet inflated about the calf.  A small dorsomedial incision was made over the first MTP joint. Dissection was carried down to the capsule.  This was incised in a V-Y fashion.  This was broadly based distally.  The bunion was exposed.  An oscillating saw was used to remove this, parallel with the medial border of the foot.  A Chevron cut was then made with the same saw.  The distal fragment was displaced in a lateral direction about 5 mm.  This was found to be fairly stable but nevertheless was stabilized additionally with a Biosorb pin, which was placed under fluoroscopic  guidance.  I then removed the  prominent portion of the metatarsal, after the osteotomy, again parallel with the medial border of the foot.  A rongeur was used to smooth some rough edges.  Fluoroscopy was used to confirm adequate correction and I read these views myself.  The wound was thoroughly irrigated, followed by  excision of a portion of the capsule, which was now redundant in repair, with the toe in proper position using #1 Vicryl suture in interrupted fashion.  The skin was then repaired with vertical mattresses of nylon.  The tourniquet was deflated and foot became pink and warm immediately.  Adaptic was placed on the wound, followed by dry gauze and a loose Ace wrap, with a two spacer between the first and seconds toes.  Estimated blood loss and intraoperative fluids can be obtained from anesthesia records as can accurate tourniquet time.  DISPOSITION:  The patient was taken to the recovery room in stable condition. Plans were for her to go home the same day and to follow up in the office in less than a week.  I will contact her by phone tonight. DD:  01/17/00 TD:  01/18/00 Job: [id number]

## 2011-03-10 NOTE — Procedures (Signed)
[place]  Patient:    Desiree [name], Desiree [name]                        MRN: [id number] Proc. Date: 06/08/00 Adm. Date:  [id number] Disc. Date: [id number] Attending:  Charna Elizabeth CC:         Diamantina Providence, M.D.   Procedure Report  DATE OF BIRTH:  [birth year]  REFERRING PHYSICIAN:  Diamantina Providence, M.D.  PROCEDURE PERFORMED:  Colonoscopy.  ENDOSCOPIST:  Anselmo Rod, M.D.  INSTRUMENT USED:  Olympus video colonoscope.  INDICATIONS FOR PROCEDURE:  Rectal bleeding with right lower quadrant pain and weight loss in a 59 year old black female rule out colonic polyps, masses, hemorrhoids, etc.  PREPROCEDURE PREPARATION:  Informed consent was procured from the patient. The patient was fasted for eight hours prior to the procedure and prepped with a bottle of magnesium citrate and a gallon of NuLytely the night prior to the procedure.  PREPROCEDURE PHYSICAL:  The patient had stable vital signs.  Neck supple. Chest clear to auscultation.  S1, S2 regular.  Abdomen soft with normal abdominal bowel sounds.  DESCRIPTION OF PROCEDURE:  The patient was placed in the left lateral decubitus position and sedated with 25 mg of Demerol and 4 mg of Versed intravenously.  Once the patient was adequately sedated and maintained on low-flow oxygen and continuous cardiac monitoring, the Olympus video colonoscope was advanced from the rectum to the cecum without difficulty.  The patient had a fairly good prep.  No masses, polyps, erosions, ulcerations or diverticula were seen.  The patient had small nonbleeding internal hemorrhoids and tolerated the procedure well without complication.  No masses, polyps, erosions, ulcerations or diverticula were seen.  IMPRESSION:  Normal colonoscopy except for small nonbleeding internal hemorrhoids.  RECOMMENDATIONS: 1. CT scan of the abdomen and pelvis is being scheduled for the patient to    further work up her  weight loss and abdominal pain. 2. Outpatient follow-up advised in the next two weeks. 3. A CBC will be checked today considering a low blood pressure with an    average of 83/40.  DD:  06/08/00 TD:  06/10/00 Job: [id number]

## 2011-04-17 ENCOUNTER — Telehealth: Payer: Self-pay | Admitting: *Deleted

## 2011-04-17 NOTE — Telephone Encounter (Signed)
Updated medications

## 2011-04-28 ENCOUNTER — Other Ambulatory Visit: Payer: Self-pay | Admitting: Internal Medicine

## 2011-04-28 NOTE — Telephone Encounter (Signed)
Last ov 10/04/10 for a follow up Last ov 11/08/10 to discuss home health

## 2011-05-04 ENCOUNTER — Telehealth: Payer: Self-pay | Admitting: *Deleted

## 2011-05-04 MED ORDER — PIOGLITAZONE HCL 30 MG PO TABS: 30.0000 mg | ORAL_TABLET | Freq: Every day | ORAL | Status: DC

## 2011-05-04 NOTE — Telephone Encounter (Signed)
Refill on actos

## 2011-05-05 ENCOUNTER — Inpatient Hospital Stay (HOSPITAL_COMMUNITY)
Admission: EM | Admit: 2011-05-05 | Discharge: 2011-05-09 | DRG: 533 | Disposition: A | Discharge status: Home Health | Payer: BC Managed Care – PPO | Attending: Internal Medicine | Admitting: Internal Medicine

## 2011-05-05 ENCOUNTER — Emergency Department (HOSPITAL_COMMUNITY): Payer: BC Managed Care – PPO

## 2011-05-05 DIAGNOSIS — I129 Hypertensive chronic kidney disease with stage 1 through stage 4 chronic kidney disease, or unspecified chronic kidney disease: Secondary | ICD-10-CM | POA: Diagnosis present

## 2011-05-05 DIAGNOSIS — K219 Gastro-esophageal reflux disease without esophagitis: Secondary | ICD-10-CM | POA: Diagnosis present

## 2011-05-05 DIAGNOSIS — G250 Essential tremor: Principal | ICD-10-CM | POA: Diagnosis present

## 2011-05-05 DIAGNOSIS — D649 Anemia, unspecified: Secondary | ICD-10-CM | POA: Diagnosis present

## 2011-05-05 DIAGNOSIS — G252 Other specified forms of tremor: Principal | ICD-10-CM | POA: Diagnosis present

## 2011-05-05 DIAGNOSIS — T4275XA Adverse effect of unspecified antiepileptic and sedative-hypnotic drugs, initial encounter: Secondary | ICD-10-CM | POA: Diagnosis present

## 2011-05-05 DIAGNOSIS — E119 Type 2 diabetes mellitus without complications: Secondary | ICD-10-CM | POA: Diagnosis present

## 2011-05-05 DIAGNOSIS — N184 Chronic kidney disease, stage 4 (severe): Secondary | ICD-10-CM | POA: Diagnosis present

## 2011-05-05 DIAGNOSIS — R5381 Other malaise: Secondary | ICD-10-CM | POA: Diagnosis present

## 2011-05-05 DIAGNOSIS — T428X5A Adverse effect of antiparkinsonism drugs and other central muscle-tone depressants, initial encounter: Secondary | ICD-10-CM | POA: Diagnosis present

## 2011-05-05 DIAGNOSIS — Y92009 Unspecified place in unspecified non-institutional (private) residence as the place of occurrence of the external cause: Secondary | ICD-10-CM

## 2011-05-05 DIAGNOSIS — Z981 Arthrodesis status: Secondary | ICD-10-CM

## 2011-05-05 DIAGNOSIS — N179 Acute kidney failure, unspecified: Secondary | ICD-10-CM | POA: Diagnosis present

## 2011-05-05 DIAGNOSIS — E785 Hyperlipidemia, unspecified: Secondary | ICD-10-CM | POA: Diagnosis present

## 2011-05-05 DIAGNOSIS — T40605A Adverse effect of unspecified narcotics, initial encounter: Secondary | ICD-10-CM | POA: Diagnosis present

## 2011-05-05 DIAGNOSIS — IMO0002 Reserved for concepts with insufficient information to code with codable children: Secondary | ICD-10-CM | POA: Diagnosis present

## 2011-05-05 LAB — URINALYSIS, ROUTINE W REFLEX MICROSCOPIC
Bilirubin Urine: NEGATIVE
Glucose, UA: NEGATIVE mg/dL
Hgb urine dipstick: NEGATIVE
Ketones, ur: NEGATIVE mg/dL
Leukocytes, UA: NEGATIVE
Nitrite: NEGATIVE
Protein, ur: NEGATIVE mg/dL
Specific Gravity, Urine: 1.013 (ref 1.005–1.030)
Urobilinogen, UA: 0.2 mg/dL (ref 0.0–1.0)
pH: 5 (ref 5.0–8.0)

## 2011-05-05 LAB — DIFFERENTIAL
Basophils Absolute: 0 10*3/uL (ref 0.0–0.1)
Basophils Relative: 0 % (ref 0–1)
Eosinophils Absolute: 0.6 10*3/uL (ref 0.0–0.7)
Eosinophils Relative: 6 % — ABNORMAL HIGH (ref 0–5)
Lymphocytes Relative: 22 % (ref 12–46)
Lymphs Abs: 2 10*3/uL (ref 0.7–4.0)
Monocytes Absolute: 0.6 10*3/uL (ref 0.1–1.0)
Monocytes Relative: 6 % (ref 3–12)
Neutro Abs: 6.3 10*3/uL (ref 1.7–7.7)
Neutrophils Relative %: 66 % (ref 43–77)

## 2011-05-05 LAB — CK TOTAL AND CKMB (NOT AT ARMC)
CK, MB: 1.5 ng/mL (ref 0.3–4.0)
Relative Index: 1.3 (ref 0.0–2.5)
Total CK: 118 U/L (ref 7–177)

## 2011-05-05 LAB — GLUCOSE, CAPILLARY
Glucose-Capillary: 101 mg/dL — ABNORMAL HIGH (ref 70–99)
Glucose-Capillary: 111 mg/dL — ABNORMAL HIGH (ref 70–99)

## 2011-05-05 LAB — CBC
HCT: 34.1 % — ABNORMAL LOW (ref 36.0–46.0)
Hemoglobin: 10.6 g/dL — ABNORMAL LOW (ref 12.0–15.0)
MCH: 28.2 pg (ref 26.0–34.0)
MCHC: 31.1 g/dL (ref 30.0–36.0)
MCV: 90.7 fL (ref 78.0–100.0)
Platelets: 293 10*3/uL (ref 150–400)
RBC: 3.76 MIL/uL — ABNORMAL LOW (ref 3.87–5.11)
RDW: 17.1 % — ABNORMAL HIGH (ref 11.5–15.5)
WBC: 9.5 10*3/uL (ref 4.0–10.5)

## 2011-05-05 LAB — POCT I-STAT, CHEM 8
BUN: 23 mg/dL (ref 6–23)
Calcium, Ion: 1.06 mmol/L — ABNORMAL LOW (ref 1.12–1.32)
Chloride: 107 mEq/L (ref 96–112)
Creatinine, Ser: 2.2 mg/dL — ABNORMAL HIGH (ref 0.50–1.10)
Glucose, Bld: 80 mg/dL (ref 70–99)
HCT: 34 % — ABNORMAL LOW (ref 36.0–46.0)
Hemoglobin: 11.6 g/dL — ABNORMAL LOW (ref 12.0–15.0)
Potassium: 4.5 mEq/L (ref 3.5–5.1)
Sodium: 140 mEq/L (ref 135–145)
TCO2: 24 mmol/L (ref 0–100)

## 2011-05-05 LAB — COMPREHENSIVE METABOLIC PANEL
ALT: 10 U/L (ref 0–35)
AST: 20 U/L (ref 0–37)
Albumin: 3.6 g/dL (ref 3.5–5.2)
Alkaline Phosphatase: 90 U/L (ref 39–117)
BUN: 24 mg/dL — ABNORMAL HIGH (ref 6–23)
CO2: 29 mEq/L (ref 19–32)
Calcium: 9.7 mg/dL (ref 8.4–10.5)
Chloride: 103 mEq/L (ref 96–112)
Creatinine, Ser: 1.99 mg/dL — ABNORMAL HIGH (ref 0.50–1.10)
GFR calc Af Amer: 31 mL/min — ABNORMAL LOW (ref >60)
GFR calc non Af Amer: 26 mL/min — ABNORMAL LOW (ref >60)
Glucose, Bld: 76 mg/dL (ref 70–99)
Potassium: 3.9 mEq/L (ref 3.5–5.1)
Sodium: 141 mEq/L (ref 135–145)
Total Bilirubin: 0.2 mg/dL — ABNORMAL LOW (ref 0.3–1.2)
Total Protein: 7.7 g/dL (ref 6.0–8.3)

## 2011-05-05 LAB — PROTIME-INR
INR: 1.04 (ref 0.00–1.49)
Prothrombin Time: 13.8 seconds (ref 11.6–15.2)

## 2011-05-05 LAB — APTT: aPTT: 37 seconds (ref 24–37)

## 2011-05-05 LAB — TROPONIN I: Troponin I: <0.3 ng/mL (ref <0.30)

## 2011-05-06 ENCOUNTER — Other Ambulatory Visit: Payer: Self-pay | Admitting: Internal Medicine

## 2011-05-06 DIAGNOSIS — I059 Rheumatic mitral valve disease, unspecified: Secondary | ICD-10-CM

## 2011-05-06 LAB — CBC
HCT: 28.5 % — ABNORMAL LOW (ref 36.0–46.0)
Hemoglobin: 9.3 g/dL — ABNORMAL LOW (ref 12.0–15.0)
MCH: 29.2 pg (ref 26.0–34.0)
MCHC: 32.6 g/dL (ref 30.0–36.0)
MCV: 89.6 fL (ref 78.0–100.0)
Platelets: 274 10*3/uL (ref 150–400)
RBC: 3.18 MIL/uL — ABNORMAL LOW (ref 3.87–5.11)
RDW: 17.2 % — ABNORMAL HIGH (ref 11.5–15.5)
WBC: 6.5 10*3/uL (ref 4.0–10.5)

## 2011-05-06 LAB — GLUCOSE, CAPILLARY
Glucose-Capillary: 101 mg/dL — ABNORMAL HIGH (ref 70–99)
Glucose-Capillary: 101 mg/dL — ABNORMAL HIGH (ref 70–99)
Glucose-Capillary: 102 mg/dL — ABNORMAL HIGH (ref 70–99)
Glucose-Capillary: 84 mg/dL (ref 70–99)

## 2011-05-06 LAB — HEMOGLOBIN A1C
Hgb A1c MFr Bld: 6.4 % — ABNORMAL HIGH (ref <5.7)
Mean Plasma Glucose: 137 mg/dL — ABNORMAL HIGH (ref <117)

## 2011-05-06 LAB — BASIC METABOLIC PANEL
BUN: 26 mg/dL — ABNORMAL HIGH (ref 6–23)
CO2: 31 mEq/L (ref 19–32)
Calcium: 9 mg/dL (ref 8.4–10.5)
Chloride: 103 mEq/L (ref 96–112)
Creatinine, Ser: 2.07 mg/dL — ABNORMAL HIGH (ref 0.50–1.10)
GFR calc Af Amer: 30 mL/min — ABNORMAL LOW (ref >60)
GFR calc non Af Amer: 25 mL/min — ABNORMAL LOW (ref >60)
Glucose, Bld: 93 mg/dL (ref 70–99)
Potassium: 3.8 mEq/L (ref 3.5–5.1)
Sodium: 139 mEq/L (ref 135–145)

## 2011-05-06 LAB — LIPID PANEL
Cholesterol: 114 mg/dL (ref 0–200)
HDL: 44 mg/dL (ref >39)
LDL Cholesterol: 54 mg/dL (ref 0–99)
Total CHOL/HDL Ratio: 2.6 RATIO
Triglycerides: 81 mg/dL (ref <150)
VLDL: 16 mg/dL (ref 0–40)

## 2011-05-07 ENCOUNTER — Observation Stay (HOSPITAL_COMMUNITY): Payer: BC Managed Care – PPO

## 2011-05-07 LAB — BASIC METABOLIC PANEL
BUN: 29 mg/dL — ABNORMAL HIGH (ref 6–23)
BUN: 28 mg/dL — ABNORMAL HIGH (ref 6–23)
CO2: 27 mEq/L (ref 19–32)
CO2: 31 mEq/L (ref 19–32)
Calcium: 8.4 mg/dL (ref 8.4–10.5)
Calcium: 9.2 mg/dL (ref 8.4–10.5)
Chloride: 102 mEq/L (ref 96–112)
Chloride: 100 mEq/L (ref 96–112)
Creatinine, Ser: 2.27 mg/dL — ABNORMAL HIGH (ref 0.50–1.10)
Creatinine, Ser: 2.13 mg/dL — ABNORMAL HIGH (ref 0.50–1.10)
GFR calc Af Amer: 27 mL/min — ABNORMAL LOW (ref >60)
GFR calc Af Amer: 29 mL/min — ABNORMAL LOW (ref >60)
GFR calc non Af Amer: 22 mL/min — ABNORMAL LOW (ref >60)
GFR calc non Af Amer: 24 mL/min — ABNORMAL LOW (ref >60)
Glucose, Bld: 123 mg/dL — ABNORMAL HIGH (ref 70–99)
Glucose, Bld: 88 mg/dL (ref 70–99)
Potassium: 4.1 mEq/L (ref 3.5–5.1)
Potassium: 4 mEq/L (ref 3.5–5.1)
Sodium: 137 mEq/L (ref 135–145)
Sodium: 137 mEq/L (ref 135–145)

## 2011-05-07 LAB — GLUCOSE, CAPILLARY
Glucose-Capillary: 95 mg/dL (ref 70–99)
Glucose-Capillary: 147 mg/dL — ABNORMAL HIGH (ref 70–99)
Glucose-Capillary: 85 mg/dL (ref 70–99)
Glucose-Capillary: 91 mg/dL (ref 70–99)
Glucose-Capillary: 101 mg/dL — ABNORMAL HIGH (ref 70–99)

## 2011-05-07 LAB — CBC
HCT: 35.4 % — ABNORMAL LOW (ref 36.0–46.0)
Hemoglobin: 11.2 g/dL — ABNORMAL LOW (ref 12.0–15.0)
MCH: 28.8 pg (ref 26.0–34.0)
MCHC: 31.6 g/dL (ref 30.0–36.0)
MCV: 91 fL (ref 78.0–100.0)
Platelets: 295 10*3/uL (ref 150–400)
RBC: 3.89 MIL/uL (ref 3.87–5.11)
RDW: 17.1 % — ABNORMAL HIGH (ref 11.5–15.5)
WBC: 9.2 10*3/uL (ref 4.0–10.5)

## 2011-05-08 ENCOUNTER — Observation Stay (HOSPITAL_COMMUNITY): Payer: BC Managed Care – PPO

## 2011-05-08 DIAGNOSIS — F3112 Bipolar disorder, current episode manic without psychotic features, moderate: Secondary | ICD-10-CM

## 2011-05-08 DIAGNOSIS — M2060 Acquired deformities of toe(s), unspecified, unspecified foot: Secondary | ICD-10-CM

## 2011-05-08 LAB — CBC
HCT: 28.1 % — ABNORMAL LOW (ref 36.0–46.0)
Hemoglobin: 9 g/dL — ABNORMAL LOW (ref 12.0–15.0)
MCH: 28.9 pg (ref 26.0–34.0)
MCHC: 32 g/dL (ref 30.0–36.0)
MCV: 90.4 fL (ref 78.0–100.0)
Platelets: 269 10*3/uL (ref 150–400)
RBC: 3.11 MIL/uL — ABNORMAL LOW (ref 3.87–5.11)
RDW: 17 % — ABNORMAL HIGH (ref 11.5–15.5)
WBC: 9.2 10*3/uL (ref 4.0–10.5)

## 2011-05-08 LAB — GLUCOSE, CAPILLARY
Glucose-Capillary: 122 mg/dL — ABNORMAL HIGH (ref 70–99)
Glucose-Capillary: 130 mg/dL — ABNORMAL HIGH (ref 70–99)
Glucose-Capillary: 126 mg/dL — ABNORMAL HIGH (ref 70–99)

## 2011-05-08 LAB — BASIC METABOLIC PANEL
BUN: 31 mg/dL — ABNORMAL HIGH (ref 6–23)
CO2: 28 mEq/L (ref 19–32)
Calcium: 8.7 mg/dL (ref 8.4–10.5)
Chloride: 102 mEq/L (ref 96–112)
Creatinine, Ser: 2.54 mg/dL — ABNORMAL HIGH (ref 0.50–1.10)
GFR calc Af Amer: 23 mL/min — ABNORMAL LOW (ref >60)
GFR calc non Af Amer: 19 mL/min — ABNORMAL LOW (ref >60)
Glucose, Bld: 116 mg/dL — ABNORMAL HIGH (ref 70–99)
Potassium: 4.4 mEq/L (ref 3.5–5.1)
Sodium: 136 mEq/L (ref 135–145)

## 2011-05-08 LAB — URINALYSIS, ROUTINE W REFLEX MICROSCOPIC
Bilirubin Urine: NEGATIVE
Glucose, UA: NEGATIVE mg/dL
Hgb urine dipstick: NEGATIVE
Ketones, ur: NEGATIVE mg/dL
Nitrite: NEGATIVE
Protein, ur: NEGATIVE mg/dL
Specific Gravity, Urine: 1.018 (ref 1.005–1.030)
Urobilinogen, UA: 0.2 mg/dL (ref 0.0–1.0)
pH: 5 (ref 5.0–8.0)

## 2011-05-08 LAB — URINE MICROSCOPIC-ADD ON

## 2011-05-08 LAB — URINE CULTURE
Colony Count: 25000
Culture  Setup Time: 201207140047

## 2011-05-08 LAB — TSH: TSH: 3.015 u[IU]/mL (ref 0.350–4.500)

## 2011-05-08 NOTE — Telephone Encounter (Signed)
LOV 11/08/10 NOV NONE PLEASE ADVISE

## 2011-05-09 LAB — URINE CULTURE
Colony Count: 7000
Culture  Setup Time: 201207170131
Special Requests: NEGATIVE

## 2011-05-09 LAB — GLUCOSE, CAPILLARY
Glucose-Capillary: 129 mg/dL — ABNORMAL HIGH (ref 70–99)
Glucose-Capillary: 102 mg/dL — ABNORMAL HIGH (ref 70–99)
Glucose-Capillary: 145 mg/dL — ABNORMAL HIGH (ref 70–99)

## 2011-05-09 LAB — BASIC METABOLIC PANEL
BUN: 32 mg/dL — ABNORMAL HIGH (ref 6–23)
CO2: 28 mEq/L (ref 19–32)
Calcium: 8.7 mg/dL (ref 8.4–10.5)
Chloride: 105 mEq/L (ref 96–112)
Creatinine, Ser: 2.41 mg/dL — ABNORMAL HIGH (ref 0.50–1.10)
GFR calc Af Amer: 25 mL/min — ABNORMAL LOW (ref >60)
GFR calc non Af Amer: 21 mL/min — ABNORMAL LOW (ref >60)
Glucose, Bld: 102 mg/dL — ABNORMAL HIGH (ref 70–99)
Potassium: 4.3 mEq/L (ref 3.5–5.1)
Sodium: 138 mEq/L (ref 135–145)

## 2011-05-09 LAB — CBC
HCT: 27.8 % — ABNORMAL LOW (ref 36.0–46.0)
Hemoglobin: 9 g/dL — ABNORMAL LOW (ref 12.0–15.0)
MCH: 28.9 pg (ref 26.0–34.0)
MCHC: 32.4 g/dL (ref 30.0–36.0)
MCV: 89.4 fL (ref 78.0–100.0)
Platelets: 268 10*3/uL (ref 150–400)
RBC: 3.11 MIL/uL — ABNORMAL LOW (ref 3.87–5.11)
RDW: 17.2 % — ABNORMAL HIGH (ref 11.5–15.5)
WBC: 9.4 10*3/uL (ref 4.0–10.5)

## 2011-05-15 NOTE — H&P (Signed)
Desiree [name], Desiree [name]                 ACCOUNT NO.:  [id number]  MEDICAL RECORD NO.:  [id number]  LOCATION:  WLED                         FACILITY:  [facility]  PHYSICIAN:  Della Goo, M.D. DATE OF BIRTH:  February 17, 1951  DATE OF ADMISSION:  05/05/2011 DATE OF DISCHARGE:                             HISTORY & PHYSICAL   PRIMARY CARE PHYSICIAN:  Lonia Blood, M.D.  CHIEF COMPLAINT:  Shaking all over.  HISTORY OF PRESENT ILLNESS:  This is a 60 year old female with multiple medical problems, who was brought to the emergency department by her daughter secondary to symptoms of shaking all over and weakness.  The patient states that she awakened at 9 a.m., was shaking all over and she was attempting to go to the restroom, but decided to go back to bed and rest after the episode of shaking all over her hands, leg, body, and her feet.  So, she went back to bed and she awakened a little bit later and she states she was still shaking.  The patient states she called the orthopedic surgeon, Dr. Ophelia Charter, who had performed a surgery on her in May on her lower back, a lower back fusion, and she was advised to go to the emergency department.  The patient reports having some minor episodes similar to this in the past of shaking of her hands.  She denies having any fevers or chills.  Denies having any nausea, vomiting, or diarrhea. The patient states at home, she did check her blood sugar and her blood sugar was found to be 100.  She denied having any sweats, cough, chest pain or shortness of breath.  The patient was seen in the emergency department.  She was evaluated.  A CT scan of her head was performed and was found to be without acute intracranial abnormalities.  She also had an MRI performed, which as well returned without any acute abnormalities.  The patient was referred for medical admission.  PAST MEDICAL HISTORY:  Significant for, 1. History of hypertension. 2. Hyperlipidemia. 3. Type 2  diabetes mellitus. 4. Gastroesophageal reflux disease. 5. Osteoarthritis. 6. 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 7. History of supraventricular tachycardia. 8. Degenerative disk disease.  PAST SURGICAL HISTORY:  History of a lumbar fusion x2, left eyelid surgery, left middle finger fracture repair, left ear keloid excision, and a nasal fracture surgery.  MEDICATIONS:  Diclofenac, Lyrica, omeprazole, aspirin, furosemide, Actos, Crestor, Dexilant, temazepam, Cymbalta, Seroquel.  ALLERGIES:  PENICILLIN and CODEINE causing itching and hives.  SOCIAL HISTORY:  The patient is a former smoker, she quit in 2003.  She is a nondrinker.  No history of illicit drug usage.  FAMILY HISTORY:  Positive for coronary artery disease in both of her parents.  Positive diabetes in her mother, positive hypertension in her father, and her paternal uncle had lung cancer and a brother had throat cancer and both cancers were associated with tobacco usage, smoking, and dipping snuff respectively.  REVIEW OF SYSTEMS:  Pertinent as mentioned above.  PHYSICAL EXAMINATION FINDINGS:  GENERAL:  This is a pleasant 59 year old, well-nourished, well-developed African American female, who is in no acute distress.  She is tremulous with intention or  a motion.  Please note that when the patient calms down, the tremors do stop. HEENT:  Normocephalic, atraumatic.  Pupils equally round, reactive to light.  Extraocular movements are intact.  Funduscopic benign.  There is no scleral icterus.  Nares are patent bilaterally.  Oropharynx is clear. NECK:  Supple, full range of motion.  No thyromegaly, adenopathy, jugular venous distention. CARDIOVASCULAR:  Regular rate and rhythm.  No murmurs, gallops or rubs. LUNGS:  Clear to auscultation bilaterally.  No rales, rhonchi or wheezes.  Chest wall is nontender, no unusual masses.  Chest wall excursion is symmetric and breathing is unlabored. ABDOMEN:  Positive bowel  sounds, soft, nontender, nondistended.  No hepatosplenomegaly. EXTREMITIES:  Without cyanosis, clubbing or edema. NEUROLOGIC:  The patient is alert and oriented x3.  Her speech is clear. Cranial nerves are intact.  There is no pronator drifting.  There is a tremor most pronounced in the left upper extremity.  However, the tremor is also diffuse.  There is no cogwheel rigidity on examination.  Grip strength is decreased in the left wrist; the patient states this is because of her carpal tunnel.  Gait is not assessed and the patient does state that she walks with a cane.  LABORATORY STUDIES:  White blood cell count 9.5, hemoglobin 10.6, hematocrit 34.1, MCV 90.7, platelets 293, neutrophils 66%, lymphocytes 22%.  Sodium 140, potassium 4.5, chloride 107, CO2 24, BUN 23, creatinine 2.20.  Cardiac enzymes with a CK total of 118, CK-MB 1.5, relative index 1.3, the troponin was less than 0.30.  CT scan of the head as mentioned above in the HPI.  MRI scan of the brain as mentioned above.  Chest x-ray reveals no acute disease findings.  EKG reveals normal sinus rhythm and no acute ST-segment changes are seen.  ASSESSMENT:  This is a 60 year old female being admitted with, 1. Diffuse intention tremors. 2. Acute renal failure with chronic renal insufficiency versus chronic     kidney disease stage 2. 3. Type 2 diabetes mellitus. 4. Degenerative disk disease. 5. Osteoarthritis. 6. Mild normocytic anemia.  PLAN:  The patient will be admitted to telemetry area for monitoring and the patient has been placed on the CVA protocol, however, it appears that this patient may have Parkinson disease or a dystonic reaction to Seroquel therapy.  Neurologic checks will continue to be performed and the patient's medications will be further reconciled.  P.r.n. Ativan therapy will be ordered for agitation and tremor and Cogentin therapy will also be added since the patient is on Seroquel therapy and a  neurology consultation will also be placed for further evaluation and treatment of Parkinson disease.  The patient will be placed on DVT prophylaxis.  The patient is a full code.     Della Goo, M.D.     HJ/MEDQ  D:  05/05/2011  T:  05/05/2011  Job:  [id number]  cc:   Lonia Blood, M.D.  Electronically Signed by Della Goo M.D. on 05/15/2011 12:34:08 PM

## 2011-05-16 NOTE — Consult Note (Signed)
NAMEMONSERATT, LEDIN                 ACCOUNT NO.:  [id number]  MEDICAL RECORD NO.:  [id number]  LOCATION:  1503                         FACILITY:  [facility]  PHYSICIAN:  Levert Feinstein, MD          DATE OF BIRTH:  1951/05/27  DATE OF CONSULTATION: DATE OF DISCHARGE:                                CONSULTATION   CHIEF COMPLAINT:  Bilateral hands tremor.  HISTORY OF PRESENT ILLNESS:  The patient is a 60 year old right-handed African-American female, was brought in by her daughter to the emergency room today after waking up noticed bilateral hands and also bilateral lower extremity tremor.  She has past medical history of bipolar disorder, hypertension, hyperlipidemia, diabetes, was on polypharmacy treatment including Seroquel 100 mg every night, tramadol 50 mg twice a day, also methocarbamol 500 mg every 6 hours, Lyrica 75 mg twice a day, Cymbalta 30 mg every day.  She has multiple surgeries in the past including low back surgery. Complains of chronic low back pain, mild gait difficulty because of the pain.  Has been relying on walker over the past 1 year.  Woke up this morning and noticed she had bilateral arm and leg trembling , she was able to ambulate to bathroom, holding on things, come back to the bed, slept another couple of hours.  Around noon when she tried to get up again, noticed intermittent bilateral leg and upper extremity tremor,  She has chronic low back pain at her baseline.  There was no loss of consciousness and this tremor was symmetric, and there was no weakness on sensory findings.  She already had MRI of the brain with and without contrast, I have reviewed the film.  There was no acute lesion, just slight atrophy.  LABORATORY EVALUATION:  CMP showed mild elevated creatinine 1.99, BUN was 24, otherwise normal.  Normal INR, PT.  Negative troponin.  CPK 118. CBC, mild anemia with hemoglobin of 10.6 and UA was negative.  BNP was normal at 8.9.  REVIEW OF SYSTEMS:   Pertinent as above.  PAST MEDICAL HISTORY:  Hypertension, hyperlipidemia, diabetes and bipolar disorder.  PAST SURGICAL HISTORY:  Reported a history of left ulnar transpositional surgery, right carpal tunnel surgery, planning on to have left carpal tunnel release on May 14, 2011, right and low back decompression surgery, left knee arthroscopic surgery.  ALLERGIES:  CODEINE and PENICILLIN causes rash.  FAMILY HISTORY:  Mother had diabetes, died at age 48 from complications. Father died at age 83.  There is also family history of cancer.  SOCIAL HISTORY:  She lives by herself, still driving, but on disability. No smoking, no drinking.  PHYSICAL EXAMINATION:  VITAL SIGNS:  Temperature 98.8, blood pressure 104/66, heart rate of 95 and respirations of 22. GENERAL:  She is awake, alert, oriented to history, taking care of conversation.  No aphasia.  No dysarthria. CARDIAC:  Regular rate and rhythm. NECK:  Supple. NEURO:  Cranial nerves II through XII.  Pupil equal, round, and reactive to light.  Extraocular movements were full.  Facial sensation strength was normal.  Uvula and tongue midline.  Head turning, shoulder shrugging normal and symmetric.  Tongue protrusion into cheek  strength was normal. Motor examination, normal tone, bulk and strength; but she has intermittent variable amplitude, bilateral upper extremity tremor, variable effort, made worse by a finger-to-nose movement.  There was no rigidity, no fatiguability found.  Deep tendon reflexes were present and symmetric.  Plantar responses were flexor.  Mild lens dependent decreased vibratory sensation.  Gait, she relied on a cane, wide-based, cautious but fairly steady.  ASSESSMENT/PLAN: 72. A 60 year old right-handed African-American female with past     medical history of hypertension, hyperlipidemia, bipolar, on     polypharmacy treatment, presenting with acute onset of bilateral     upper extremity postural tremor,  variable effort on examination,     differentiation diagnosis including metabolic toxic versus medicine     side effects. 2. PT, OT. 3. We will discontinue methocarbamol, may consider discontinuing     Lyrica, tramadol. 4. Laboratory evaluation including thyroid function test.  Continue to     observe the patient.     Levert Feinstein, MD     YY/MEDQ  D:  05/05/2011  T:  05/06/2011  Job:  [id number]  Electronically Signed by Levert Feinstein MD on 05/16/2011 08:53:20 AM

## 2011-05-18 NOTE — Discharge Summary (Signed)
Desiree [name], Desiree [name]                 ACCOUNT NO.:  [id number]  MEDICAL RECORD NO.:  [id number]  LOCATION:  1503                         FACILITY:  [facility]  PHYSICIAN:  Erick Blinks, MD     DATE OF BIRTH:  10-15-1951  DATE OF ADMISSION:  05/05/2011 DATE OF DISCHARGE:                              DISCHARGE SUMMARY   PRIMARY CARE PHYSICIAN:  Lonia Blood, M.D.  NEPHROLOGIST:  Dr. Allena Katz.  DISCHARGING DIAGNOSES: 1. Severe tremors secondary to medications, improved. 2. 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 3. Dehydration. 4. Degenerative disk disease. 5. Non-insulin dependent diabetes. 6. Osteoarthritis. 7. Normocytic anemia. 8. Gastroesophageal reflux disease. 9. Hyperlipidemia. 10.Hypertension. 11.Osteoarthritis.  DISCHARGE MEDICATIONS: 1. Percocet 5/325 mg one to two tablets by mouth every 6 hours as     needed. 2. Actos 30 mg 1 tablet by mouth daily. 3. Crestor 10 mg 1 tablet by mouth daily. 4. Colace 100 mg 2 capsules by mouth twice daily. 5. Cymbalta 30 mg 3 capsules by mouth daily at bedtime. 6. Dexilant 60 mg 1 capsule by mouth daily. 7. Diclofenac gel 3% one application topically daily to ankle. 8. Therapeutic multivitamins 1 tablet by mouth daily. 9. Seroquel 400 mg at bedtime 1 tablet daily as needed. 10.Temazepam 15 mg 1 capsule by mouth daily at bedtime as needed. 11.Ultra Flora Plus DF OTC 1 capsule by mouth daily. 12.Urea lotion 40% to ankle applied topically daily. 13.Vitamin B12 one tablet by mouth daily 100 units. 14.Vitamin D3 400 units 1 tablet by mouth daily. 15.Vitamin E 600 units 1 capsule by mouth daily.  MEDICATION STOPPED IN THE HOSPITAL: 1. Lyrica 75 mg twice daily. 2. Robaxin 500 mg every 6 hours as needed. 3. Lasix 20 mg daily. 4. Diclofenac 75 mg 1 tablet by mouth twice daily. 5. Tramadol 50 mg 1 tablet by mouth twice daily as needed.  ADMISSION HISTORY:  This is a 60 year old  African-American female who presented to the emergency room with tremors and weakness.  She was found by her daughter at 9 a.m. shaking all over.  The shaking had persisted.  She was advised to go to the emergency room.  She has had similar episodes of this which were minor in the past.  She was subsequently admitted to the hospital for further evaluation.  For details, please refer to the history and physical per Dr. Lovell Sheehan on May 05, 2011.  HOSPITAL COURSE: 1. Tremors.  The patient was initially admitted to telemetry unit, was     placed on CVA protocol.  She had ruled out for any CVA from CT as     well as MRI.  Workup in any case was complete with 2-D     echocardiogram and carotid Dopplers which did not show any     significant stenosis.  She was seen in consultation by Dr. Terrace Arabia from     Neurology and felt that her tremors were secondary to multiple     medications.  Her Lyrica, Robaxin, tramadol were discontinued.     These changes significantly improved her tremors and they are down  to a minimum at this point.  She was also seen in consultation by     Dr. Rogers Blocker from Psychiatry regarding her Seroquel dose.  This     was reviewed and felt that the dose was appropriate for her and she     should continue the same and have followup with her outpatient     psychiatrist. 2. Acute on chronic kidney disease.  The patient has baseline     creatinine of 2.  She is followed by Dr. Allena Katz as an outpatient.     She did have elevation of creatinine to 2.5 and this was in the     setting of some dehydration here in the hospital as well as     receiving diclofenac.  Her diclofenac was discontinued and she was     given gentle hydration.  Her Lasix is currently on hold.  Her     creatinine is currently stable at 2.4.  The patient is asymptomatic     and is requesting to be discharged home.  It is felt appropriate to     discharge the patient at this time.  Follow up with Dr. Allena Katz next      week with a repeat basic metabolic panel.  We will ask her to hold     her Lasix until then and she has been advised to check her weights     on a daily basis to check for any fluid retention.  She is making     good amount of urine at this point. 3. Deconditioning.  The patient was seen by Physical Therapy and     recommended Home Health Physical Therapy. 4. Remainder of the patient's medical problems have remained stable.  CONSULTATION: 1. Dr. Terrace Arabia, Neurology. 2. Psychiatry, Dr. Rogers Blocker.  DIAGNOSTIC IMAGING: 1. Chest x-ray on admission shows no acute disease. 2. CT head on May 05, 2011, shows no acute intracranial     abnormalities. 3. MRI of brain on May 05, 2011, shows mild atrophy, no acute     abnormality. 4. MRA of the head on May 08, 2011, is negative. 5. Chest x-ray on May 08, 2011, shows no evidence of acute     cardiopulmonary disease. 6. Carotid Doppler showed no significant signs of bilateral stenosis. 7. A 2-D echocardiogram done on May 06, 2011, shows EF 60-65%.  No     regional wall motion abnormalities.  Grade 1 diastolic dysfunction.  DISCHARGE INSTRUCTIONS:  The patient to follow up with her primary carephysician in next 1 to 2 weeks.  She will need to see Dr. Allena Katz in the next week and have repeat basic metabolic panel done after which time it can be decided to resume her Lasix.  She is advised to continue on a heart-healthy low-calorie diet; conduct her activity as tolerated.  Plan was discussed with the patient who was also in agreement.     Erick Blinks, MD     JM/MEDQ  D:  05/09/2011  T:  05/09/2011  Job:  [id number]  cc:   Lonia Blood, M.D.  Dr. Allena Katz  Electronically Signed by Durward Mallard MEMON  on 05/18/2011 12:46:39 AM

## 2011-05-22 ENCOUNTER — Other Ambulatory Visit: Payer: Self-pay | Admitting: Internal Medicine

## 2011-05-24 NOTE — Telephone Encounter (Signed)
Call pt to see if she is still on it? Schedule a rov.

## 2011-05-24 NOTE — Telephone Encounter (Signed)
Unsure of request.

## 2011-05-25 NOTE — Telephone Encounter (Signed)
Need to know if pt is still on lasix or not. Left message for pt to call back about this.

## 2011-05-25 NOTE — Telephone Encounter (Addendum)
Spoke to pt and she has been d/c this medication in the hospital. Pt was told by someone in the office that she couldn't make an appt here because she now has medicaid. So she started seeing another md. I asked pt if she has [place] access and she said that she never had that it was plain medicaid. She also said that she now has bcbs and medicare. I advised pt to call back and schedule a follow up appointment with [place] and that I would deny this rx.

## 2011-05-26 ENCOUNTER — Ambulatory Visit: Payer: BC Managed Care – PPO | Admitting: Internal Medicine

## 2011-05-26 ENCOUNTER — Telehealth: Payer: Self-pay | Admitting: *Deleted

## 2011-05-26 NOTE — Telephone Encounter (Signed)
Spoke with pt and she is going to get her records from Dr. Allena Katz and Dr. Mikeal Hawthorne. She was in the hospital from 7/13 thru 7/17.

## 2011-06-06 ENCOUNTER — Other Ambulatory Visit: Payer: Self-pay | Admitting: Internal Medicine

## 2011-06-06 ENCOUNTER — Other Ambulatory Visit: Payer: Self-pay | Admitting: *Deleted

## 2011-06-06 MED ORDER — FUROSEMIDE 20 MG PO TABS: 20.0000 mg | ORAL_TABLET | Freq: Every day | ORAL | Status: DC

## 2011-06-06 NOTE — Telephone Encounter (Signed)
Rx sent to pharmacy   

## 2011-06-15 ENCOUNTER — Other Ambulatory Visit: Payer: Self-pay | Admitting: Gastroenterology

## 2011-06-18 ENCOUNTER — Other Ambulatory Visit: Payer: Self-pay | Admitting: Internal Medicine

## 2011-06-19 ENCOUNTER — Ambulatory Visit
Admission: RE | Admit: 2011-06-19 | Discharge: 2011-06-19 | Disposition: A | Discharge status: Home / Self Care | Payer: BC Managed Care – PPO | Source: Ambulatory Visit | Attending: Gastroenterology | Admitting: Gastroenterology

## 2011-07-05 ENCOUNTER — Ambulatory Visit (INDEPENDENT_AMBULATORY_CARE_PROVIDER_SITE_OTHER): Payer: BC Managed Care – PPO | Admitting: Internal Medicine

## 2011-07-05 ENCOUNTER — Encounter: Payer: Self-pay | Admitting: Internal Medicine

## 2011-07-05 VITALS — BP 110/70 | HR 78 | Ht 63.0 in | Wt 178.0 lb

## 2011-07-05 DIAGNOSIS — D649 Anemia, unspecified: Secondary | ICD-10-CM

## 2011-07-05 DIAGNOSIS — N289 Disorder of kidney and ureter, unspecified: Secondary | ICD-10-CM

## 2011-07-05 DIAGNOSIS — K219 Gastro-esophageal reflux disease without esophagitis: Secondary | ICD-10-CM

## 2011-07-05 DIAGNOSIS — G2401 Drug induced subacute dyskinesia: Secondary | ICD-10-CM

## 2011-07-05 DIAGNOSIS — E119 Type 2 diabetes mellitus without complications: Secondary | ICD-10-CM

## 2011-07-05 DIAGNOSIS — I10 Essential (primary) hypertension: Secondary | ICD-10-CM

## 2011-07-05 DIAGNOSIS — M1990 Unspecified osteoarthritis, unspecified site: Secondary | ICD-10-CM

## 2011-07-05 DIAGNOSIS — Z23 Encounter for immunization: Secondary | ICD-10-CM

## 2011-07-05 DIAGNOSIS — E785 Hyperlipidemia, unspecified: Secondary | ICD-10-CM

## 2011-07-05 DIAGNOSIS — F319 Bipolar disorder, unspecified: Secondary | ICD-10-CM

## 2011-07-05 DIAGNOSIS — G5600 Carpal tunnel syndrome, unspecified upper limb: Secondary | ICD-10-CM

## 2011-07-05 LAB — BASIC METABOLIC PANEL
BUN: 14 mg/dL (ref 6–23)
CO2: 30 mEq/L (ref 19–32)
Calcium: 9.4 mg/dL (ref 8.4–10.5)
Chloride: 104 mEq/L (ref 96–112)
Creatinine, Ser: 1.6 mg/dL — ABNORMAL HIGH (ref 0.4–1.2)
GFR: 43.98 mL/min — ABNORMAL LOW (ref >60.00)
Glucose, Bld: 84 mg/dL (ref 70–99)
Potassium: 4.3 mEq/L (ref 3.5–5.1)
Sodium: 141 mEq/L (ref 135–145)

## 2011-07-05 LAB — CBC WITH DIFFERENTIAL/PLATELET
Basophils Absolute: 0 10*3/uL (ref 0.0–0.1)
Basophils Relative: 0.7 % (ref 0.0–3.0)
Eosinophils Absolute: 0.2 10*3/uL (ref 0.0–0.7)
Eosinophils Relative: 3.8 % (ref 0.0–5.0)
HCT: 35.9 % — ABNORMAL LOW (ref 36.0–46.0)
Hemoglobin: 11.5 g/dL — ABNORMAL LOW (ref 12.0–15.0)
Lymphocytes Relative: 38.3 % (ref 12.0–46.0)
Lymphs Abs: 2.2 10*3/uL (ref 0.7–4.0)
MCHC: 32.1 g/dL (ref 30.0–36.0)
MCV: 90.9 fl (ref 78.0–100.0)
Monocytes Absolute: 0.6 10*3/uL (ref 0.1–1.0)
Monocytes Relative: 9.7 % (ref 3.0–12.0)
Neutro Abs: 2.7 10*3/uL (ref 1.4–7.7)
Neutrophils Relative %: 47.5 % (ref 43.0–77.0)
Platelets: 337 10*3/uL (ref 150.0–400.0)
RBC: 3.95 Mil/uL (ref 3.87–5.11)
RDW: 17.1 % — ABNORMAL HIGH (ref 11.5–14.6)
WBC: 5.7 10*3/uL (ref 4.5–10.5)

## 2011-07-05 LAB — LIPID PANEL
Cholesterol: 127 mg/dL (ref 0–200)
HDL: 54.7 mg/dL (ref >39.00)
LDL Cholesterol: 53 mg/dL (ref 0–99)
Total CHOL/HDL Ratio: 2
Triglycerides: 97 mg/dL (ref 0.0–149.0)
VLDL: 19.4 mg/dL (ref 0.0–40.0)

## 2011-07-05 LAB — HEPATIC FUNCTION PANEL
ALT: 15 U/L (ref 0–35)
AST: 24 U/L (ref 0–37)
Albumin: 4.1 g/dL (ref 3.5–5.2)
Alkaline Phosphatase: 77 U/L (ref 39–117)
Bilirubin, Direct: 0.1 mg/dL (ref 0.0–0.3)
Total Bilirubin: 0.5 mg/dL (ref 0.3–1.2)
Total Protein: 7.6 g/dL (ref 6.0–8.3)

## 2011-07-05 LAB — HEMOGLOBIN A1C: Hgb A1c MFr Bld: 6.1 % (ref 4.6–6.5)

## 2011-07-05 NOTE — Progress Notes (Signed)
Subjective:    [name], female    DOB: 08/08/1951, 60 y.o.   MRN: [id number]  HPI Pt comes in  to reestablishe after a year absence form practice where she received  [facility].  She is coming back to our practice cause of insurance coverage on disabiity.  She has Patient comes in today for follow up of  multiple medical problems.  See problem list  Last  hospt visit was ed in July with hx of   shaking not from  a stoke and poss from medications.  DM:   No low blood sugars.  Still on actos .   Denies se   Bipolar : still seeDr Raquel James  Every 3 months   Stable   .   Lives  By self  Home nurse told her not to drive. Hx  Of falling  Remotely.  Last time   End of June .    Had cellulitis  In past  Now better.  Review of Systems ? hearing muddles    CTS t have surgery next week left Dr Ophelia Charter  Sept 17th   No new weakness.  Neg cp sob bleeding bruising vision hearing changes.  No nvd .   No change vision hearing gu sx  Motor movements no worse.  Rest as per hpi   Or neg 12 system Past Medical History  Diagnosis Date  . Hyperlipidemia   . GERD (gastroesophageal reflux disease)   . Depression   . HH (hiatus hernia)   . FH: colonic polyps   . Fractured elbow     right   . History of transfusion of packed red blood cells   . Bipolar disorder   . Osteoarthritis of more than one site   . Seasonal allergies   . History of carpal tunnel syndrome   . Neuroleptic-induced tardive dyskinesia    Past Surgical History  Procedure Date  . Back fusion 2002  . Rt. toe bunion   . Knee surgery   . Maurine Minister osteomy     reports that she quit smoking about 8 years ago. Her smoking use included Cigarettes. She has a 40 pack-year smoking history. She has never used smokeless tobacco. Her alcohol and drug histories not on file. family history includes Diabetes in her father; Hypertension in her father; Kidney disease in her brother; and Throat cancer in her father. Allergies    Allergen Reactions  . Codeine     REACTION: rash, itiching  . Hydrocodone-Acetaminophen     REACTION: hydrocodone caused her to break out before  . Penicillins     REACTION: rash    remote  on amoxiciliin         Objective:   Physical Exam WDWN in nad walks very well with a cane   Central obesity Speech somewhat slow but looks wuite well HEENT: Normocephalic ;atraumatic , Eyes;  PERRL, EOMs  Full, lids and conjunctiva clear,,Ears: no deformities, canals nl, TM landmarks normal, Nose: no deformity or discharge  Mouth : OP clear without lesion or edema .  Constant mouth movements   Neck supple no masses of bruits  Chest:  Clear to A&P without wheezes rales or rhonchi CV:  S1-S2 no gallops or murmurs peripheral perfusion is normal Abdomen:  Sof,t normal bowel sounds without hepatosplenomegaly, no guarding rebound or masses no CVA tenderness protuberant no fluid wave.  ( US done Dr Loreta Ave   No mass  Pos renal disease)  No  clubbing cyanosis or edema  welno acute joint swelling  Walking independently Neuro  oriented and cognitively intact some numbness feet no callus looks clear    Some lips motinos noted  Slight ly flat facies  Cognition intact Skin: normal capillary refill ,turgor , color: No acute rashes ,petechiae or bruising LN neg cervical adenopathy Reviewed EHR  And last not [place] [facility] in august  .  No recent labs excetp from July.   1.8  And hg 11.2     Assessment & Plan:    Hearing   DM   Only on actos for a while   No know complications although does ahd renal decline  Pre dating sig diabetes  Preventive Health Care Flu shot today  BP:   stable  Stage 3 renal disease     Avoid nsaids Depression atypical bipolar  Under care  GERD   On meds ok   Hx of candidiasis in the rmote past. Lipids:   No concerns  Fatigue ongoing Tardive dyskinesia  from psych meds in past has inproved Hearing test    For decrease hearing  Care with motions.   Fall prevention  Record  review counseling and care planning  Over 50 % of visit 45 minutes.

## 2011-07-05 NOTE — Patient Instructions (Addendum)
Will notify you  of labs when available. Then plan follow up  Or 3 months . Call when want to do audiology referral.

## 2011-07-13 ENCOUNTER — Telehealth: Payer: Self-pay | Admitting: *Deleted

## 2011-07-13 NOTE — Telephone Encounter (Signed)
Pt wants lab results 

## 2011-07-16 ENCOUNTER — Encounter: Payer: Self-pay | Admitting: Internal Medicine

## 2011-07-16 DIAGNOSIS — N289 Disorder of kidney and ureter, unspecified: Secondary | ICD-10-CM | POA: Insufficient documentation

## 2011-07-16 DIAGNOSIS — F319 Bipolar disorder, unspecified: Secondary | ICD-10-CM | POA: Insufficient documentation

## 2011-07-16 DIAGNOSIS — G2401 Drug induced subacute dyskinesia: Secondary | ICD-10-CM | POA: Insufficient documentation

## 2011-07-17 ENCOUNTER — Encounter: Payer: Self-pay | Admitting: *Deleted

## 2011-07-17 NOTE — Telephone Encounter (Signed)
Pt aware of results 

## 2011-07-17 NOTE — Telephone Encounter (Signed)
See lab result note.

## 2011-08-08 ENCOUNTER — Other Ambulatory Visit: Payer: Self-pay | Admitting: Internal Medicine

## 2011-08-21 ENCOUNTER — Telehealth: Payer: Self-pay | Admitting: *Deleted

## 2011-08-21 NOTE — Telephone Encounter (Addendum)
Pt states she has been having side effects from Pioglitazone....SOB, sweating, dizzy, swelling in stomach, dizzy and nauseated. NO fever.  Right shoulder pain.  Does not like generic medications.

## 2011-08-21 NOTE — Telephone Encounter (Signed)
Her diabetes is in control and she can stop the actos all together and see  How she does at her next visit

## 2011-08-22 NOTE — Telephone Encounter (Signed)
Left message on pt's personal voicemail 

## 2011-08-25 ENCOUNTER — Telehealth: Payer: Self-pay | Admitting: *Deleted

## 2011-08-25 NOTE — Telephone Encounter (Signed)
Pt wanted Dr. Fabian Sharp to know that she stays weak and tired all the time.

## 2011-09-07 ENCOUNTER — Other Ambulatory Visit: Payer: Self-pay | Admitting: *Deleted

## 2011-09-07 NOTE — Telephone Encounter (Signed)
**  voicemail**  Pt states she may have a sinus infection.  Also is there any thing she can use to help reduce her stomach.  Waking up during the middle of the night dripping in sweat.  Calling for advise.  Pharmacy: [facility]

## 2011-09-07 NOTE — Telephone Encounter (Signed)
Pt thinks she stays exhausted all the time. No pain, fever, or any other symptoms . Wants to know what to do next?

## 2011-09-08 NOTE — Telephone Encounter (Signed)
Unsure  If this is new otr not.  Sometime medications will do this is she on any new ones Please update confirm med list and we can try moving up her ov  To week of November 26th  Please block 2 slots

## 2011-09-08 NOTE — Telephone Encounter (Signed)
Agreed  However notice says" meds are pending "and cant close document  What meds ?

## 2011-09-08 NOTE — Telephone Encounter (Signed)
Pt states that they have taken her off the seroquel and changed her something else. Pt thinks it started when they changed her seroquel generic. Pt is going to call Dr. Debarah Crape office about the fatigue and see if the generic could be causing this.

## 2011-09-16 ENCOUNTER — Other Ambulatory Visit: Payer: Self-pay | Admitting: Internal Medicine

## 2011-10-04 ENCOUNTER — Ambulatory Visit (INDEPENDENT_AMBULATORY_CARE_PROVIDER_SITE_OTHER): Payer: BC Managed Care – PPO | Admitting: Internal Medicine

## 2011-10-04 ENCOUNTER — Encounter: Payer: Self-pay | Admitting: Internal Medicine

## 2011-10-04 VITALS — BP 140/70 | HR 60 | Wt 190.0 lb

## 2011-10-04 DIAGNOSIS — N289 Disorder of kidney and ureter, unspecified: Secondary | ICD-10-CM

## 2011-10-04 DIAGNOSIS — R259 Unspecified abnormal involuntary movements: Secondary | ICD-10-CM

## 2011-10-04 DIAGNOSIS — F319 Bipolar disorder, unspecified: Secondary | ICD-10-CM

## 2011-10-04 DIAGNOSIS — E785 Hyperlipidemia, unspecified: Secondary | ICD-10-CM

## 2011-10-04 DIAGNOSIS — M1990 Unspecified osteoarthritis, unspecified site: Secondary | ICD-10-CM

## 2011-10-04 DIAGNOSIS — E119 Type 2 diabetes mellitus without complications: Secondary | ICD-10-CM

## 2011-10-04 DIAGNOSIS — G2401 Drug induced subacute dyskinesia: Secondary | ICD-10-CM

## 2011-10-04 DIAGNOSIS — T43505A Adverse effect of unspecified antipsychotics and neuroleptics, initial encounter: Secondary | ICD-10-CM

## 2011-10-04 DIAGNOSIS — I10 Essential (primary) hypertension: Secondary | ICD-10-CM

## 2011-10-04 DIAGNOSIS — G5600 Carpal tunnel syndrome, unspecified upper limb: Secondary | ICD-10-CM

## 2011-10-04 LAB — HEMOGLOBIN A1C: Hgb A1c MFr Bld: 6.5 % (ref 4.6–6.5)
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
## 2011-10-04 NOTE — Patient Instructions (Addendum)
No Change in meds at this time. Avoid gaining more weight as this could make your sugar go up and hurt your back.   Will notify you  of labs when available. Then plan follow up or 4-6 months

## 2011-10-05 ENCOUNTER — Encounter: Payer: Self-pay | Admitting: Internal Medicine

## 2011-10-05 ENCOUNTER — Other Ambulatory Visit: Payer: Self-pay | Admitting: Internal Medicine

## 2011-10-05 DIAGNOSIS — E119 Type 2 diabetes mellitus without complications: Secondary | ICD-10-CM

## 2011-10-05 NOTE — Assessment & Plan Note (Signed)
No change walking independently improved.

## 2011-10-05 NOTE — Progress Notes (Signed)
Quick Note:  Pt aware of results. ______ 

## 2011-10-13 ENCOUNTER — Other Ambulatory Visit: Payer: Self-pay | Admitting: Internal Medicine

## 2011-11-15 ENCOUNTER — Telehealth: Payer: Self-pay | Admitting: *Deleted

## 2011-11-15 NOTE — Telephone Encounter (Signed)
Pt. Is having multiple bruising, and is asking if Dr. Fabian Sharp has any suggestions about this.

## 2011-11-20 NOTE — Telephone Encounter (Signed)
The aspirin and the cymbalta could make her bruise easier Can temporarily stop the aspirin .  If still concerned can make office visit and we can recheck her blood count

## 2011-11-21 NOTE — Telephone Encounter (Signed)
Left message with Dr. Rosezella Florida recommendations on pt's personal voice mail.

## 2011-11-30 ENCOUNTER — Telehealth: Payer: Self-pay | Admitting: *Deleted

## 2011-11-30 ENCOUNTER — Ambulatory Visit (INDEPENDENT_AMBULATORY_CARE_PROVIDER_SITE_OTHER): Payer: BC Managed Care – PPO | Admitting: Family

## 2011-11-30 ENCOUNTER — Encounter: Payer: Self-pay | Admitting: Family

## 2011-11-30 VITALS — BP 154/96 | Temp 100.0°F | Wt 188.0 lb

## 2011-11-30 DIAGNOSIS — E119 Type 2 diabetes mellitus without complications: Secondary | ICD-10-CM

## 2011-11-30 DIAGNOSIS — R059 Cough, unspecified: Secondary | ICD-10-CM

## 2011-11-30 DIAGNOSIS — R05 Cough: Secondary | ICD-10-CM

## 2011-11-30 DIAGNOSIS — J209 Acute bronchitis, unspecified: Secondary | ICD-10-CM

## 2011-11-30 MED ORDER — PREDNISONE 20 MG PO TABS: 20.0000 mg | ORAL_TABLET | Freq: Every day | ORAL | Status: AC

## 2011-11-30 MED ORDER — AMOXICILLIN 500 MG PO TABS: 1000.0000 mg | ORAL_TABLET | Freq: Two times a day (BID) | ORAL | Status: AC
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
## 2011-11-30 NOTE — Patient Instructions (Signed)

## 2011-11-30 NOTE — Telephone Encounter (Signed)
Appt. Made with [place] for ? URI/sinusitis?

## 2011-12-05 ENCOUNTER — Telehealth: Payer: Self-pay | Admitting: *Deleted

## 2011-12-05 NOTE — Telephone Encounter (Signed)
Pt wants to know how long she should stay inside with bronchitis.  Temp still 99.

## 2011-12-05 NOTE — Telephone Encounter (Signed)
Pt aware.

## 2011-12-05 NOTE — Telephone Encounter (Signed)
It is ok for her to go outside. Continue meds

## 2011-12-14 ENCOUNTER — Ambulatory Visit: Payer: BC Managed Care – PPO | Admitting: Family

## 2011-12-14 ENCOUNTER — Encounter: Payer: Self-pay | Admitting: Family

## 2011-12-14 ENCOUNTER — Ambulatory Visit (INDEPENDENT_AMBULATORY_CARE_PROVIDER_SITE_OTHER): Payer: BC Managed Care – PPO | Admitting: Family

## 2011-12-14 VITALS — BP 122/70 | Temp 98.6°F | Wt 193.0 lb

## 2011-12-14 DIAGNOSIS — R040 Epistaxis: Secondary | ICD-10-CM

## 2011-12-14 NOTE — Progress Notes (Signed)
  Subjective:    [name], female    DOB: 28-May-1951, 61 y.o.   MRN: [id number]  HPI Comments: 61 y/o AAF, patient of Dr. Fabian Sharp is in today with c/o blood from her nasal drainage when she blow her nose x 3 days. She has not taken anything for relief. Denies any bright red drainage from the nare.      Review of Systems  Constitutional: Negative.   HENT: Positive for nosebleeds.        Right nare has bleeding when blowing.  Respiratory: Negative.   Cardiovascular: Negative.   Hematological: Negative.        Objective:   Physical Exam  Constitutional: She is oriented to person, place, and time. She appears well-developed and well-nourished.  HENT:  Right Ear: External ear normal.  Left Ear: External ear normal.  Mouth/Throat: Oropharynx is clear and moist.       Right lateral nare is mildly swollen and irritated. No active bleeding. Obvious irritation.  Cardiovascular: Normal rate, regular rhythm and normal heart sounds.   Pulmonary/Chest: Effort normal and breath sounds normal.  Neurological: She is alert and oriented to person, place, and time.          Assessment & Plan:  Assessment: Epistasis  Plan: Vaseline inside the right nare twice daily. Cool mist humidifier at night. Call the office if symptoms worsen or persist. Recheck as scheduled and prn.

## 2011-12-14 NOTE — Patient Instructions (Signed)
1.Vaseline inside the right nostril twice daily to moisturize the mucous membrane.  2. Cool mist humidifier.  3. Claritin once daily for sore throat and drainage.  Nosebleed Nosebleeds can be caused by many conditions including trauma, infections, polyps, foreign bodies, dry mucous membranes or climate, medications and air conditioning. Most nosebleeds occur in the front of the nose. It is because of this location that most nosebleeds can be controlled by pinching the nostrils gently and continuously. Do this for at least 10 to 20 minutes. The reason for this long continuous pressure is that you must hold it long enough for the blood to clot. If during that 10 to 20 minute time period, pressure is released, the process may have to be started again. The nosebleed may stop by itself, quit with pressure, need concentrated heating (cautery) or stop with pressure from packing. HOME CARE INSTRUCTIONS   If your nose was packed, try to maintain the pack inside until your caregiver removes it. If a gauze pack was used and it starts to fall out, gently replace or cut the end off. Do not cut if a balloon catheter was used to pack the nose. Otherwise, do not remove unless instructed.   Avoid blowing your nose for 12 hours after treatment. This could dislodge the pack or clot and start bleeding again.   If the bleeding starts again, sit up and bending forward, gently pinch the front half of your nose continuously for 20 minutes.   If bleeding was caused by dry mucous membranes, cover the inside of your nose every morning with a petroleum or antibiotic ointment. Use your little fingertip as an applicator. Do this as needed during dry weather. This will keep the mucous membranes moist and allow them to heal.   Maintain humidity in your home by using less air conditioning or using a humidifier.   Do not use aspirin or medications which make bleeding more likely. Your caregiver can give you recommendations on  this.   Resume normal activities as able but try to avoid straining, lifting or bending at the waist for several days.   If the nosebleeds become recurrent and the cause is unknown, your caregiver may suggest laboratory tests.  SEEK IMMEDIATE MEDICAL CARE IF:   Bleeding recurs and cannot be controlled.   There is unusual bleeding from or bruising on other parts of the body.   You have a fever.   Nosebleeds continue.   There is any worsening of the condition which originally brought you in.   You become lightheaded, feel faint, become sweaty or vomit blood.  MAKE SURE YOU:   Understand these instructions.   Will watch your condition.   Will get help right away if you are not doing well or get worse.  Document Released: 07/19/2005 Document Revised: 06/21/2011 Document Reviewed: 09/10/2009 [facility] Patient Information 2012 [place], [place].

## 2012-01-22 ENCOUNTER — Other Ambulatory Visit: Payer: Self-pay | Admitting: Internal Medicine

## 2012-01-26 ENCOUNTER — Other Ambulatory Visit: Payer: BC Managed Care – PPO

## 2012-01-30 ENCOUNTER — Other Ambulatory Visit (INDEPENDENT_AMBULATORY_CARE_PROVIDER_SITE_OTHER): Payer: Medicare Other

## 2012-01-30 ENCOUNTER — Telehealth: Payer: Self-pay | Admitting: *Deleted

## 2012-01-30 DIAGNOSIS — E119 Type 2 diabetes mellitus without complications: Secondary | ICD-10-CM

## 2012-01-30 NOTE — Telephone Encounter (Signed)
How many times per day does she need to check glucose?may leave message on machine with answer

## 2012-01-31 NOTE — Telephone Encounter (Signed)
Once daily Pt informed

## 2012-02-01 LAB — HEMOGLOBIN A1C: Hgb A1c MFr Bld: 6.8 % — ABNORMAL HIGH (ref 4.6–6.5)

## 2012-02-02 ENCOUNTER — Ambulatory Visit: Payer: BC Managed Care – PPO | Admitting: Internal Medicine

## 2012-02-02 ENCOUNTER — Telehealth: Payer: Self-pay | Admitting: *Deleted

## 2012-02-02 NOTE — Telephone Encounter (Signed)
Pt would like to know if there is any meds that she could be taking that would make her hair break???

## 2012-02-05 NOTE — Telephone Encounter (Signed)
No specific  Advice will discuss at her OV this week.

## 2012-02-05 NOTE — Telephone Encounter (Signed)
Pls advise.  

## 2012-02-05 NOTE — Telephone Encounter (Signed)
Left a message for pt to return call 

## 2012-02-06 ENCOUNTER — Ambulatory Visit (INDEPENDENT_AMBULATORY_CARE_PROVIDER_SITE_OTHER): Payer: Medicare Other | Admitting: Internal Medicine

## 2012-02-06 ENCOUNTER — Encounter: Payer: Self-pay | Admitting: Internal Medicine

## 2012-02-06 VITALS — BP 112/70 | HR 99 | Temp 98.5°F | Wt 195.0 lb

## 2012-02-06 DIAGNOSIS — L639 Alopecia areata, unspecified: Secondary | ICD-10-CM | POA: Insufficient documentation

## 2012-02-06 DIAGNOSIS — T43505A Adverse effect of unspecified antipsychotics and neuroleptics, initial encounter: Secondary | ICD-10-CM

## 2012-02-06 DIAGNOSIS — F319 Bipolar disorder, unspecified: Secondary | ICD-10-CM

## 2012-02-06 DIAGNOSIS — G2401 Drug induced subacute dyskinesia: Secondary | ICD-10-CM

## 2012-02-06 DIAGNOSIS — N289 Disorder of kidney and ureter, unspecified: Secondary | ICD-10-CM

## 2012-02-06 DIAGNOSIS — E669 Obesity, unspecified: Secondary | ICD-10-CM

## 2012-02-06 DIAGNOSIS — E119 Type 2 diabetes mellitus without complications: Secondary | ICD-10-CM

## 2012-02-06 NOTE — Patient Instructions (Signed)
Continue   Avoiding sweets and sugars.  To help with the diabetes .  Will arrange dermatology appt.  Return in about 5 months for wellness and labs at that time.     Low Concentrated Sweets Diet The low concentrated sweets diet limits the amount of sugar in the foods you eat. This can help control your blood glucose (sugar) so that levels do not get too high. This diet can be used for people with diabetes or anyone with high blood glucose.  SERVING SIZES Measuring foods and serving sizes helps to make sure you are getting the right amount of food. The list below tells how big or small some common serving sizes are.   1 oz.........4 stacked dice.   3 oz........Marland KitchenDeck of cards.   1 tsp.......Marland KitchenTip of little finger.   1 tbs......Marland KitchenMarland KitchenThumb.   2 tbs.......Marland KitchenGolf ball.    cup......Marland KitchenHalf of a fist.   1 cup.......Marland KitchenA fist.  FOODS TO INCLUDE IN A BALANCED DIET The number of servings needed in each food category may be determined by your dietitian.  Grain and Starches  1 slice bread.    English muffin.    bagel.    cup cooked cereal.    cup unsweetened, cold cereal.    hamburger or hotdog bun.   ? cup cooked pasta.    cup mashed potatoes.   ? cup sweet potato.   1 small baked potato.    cup corn or 1 medium cob of corn.   1 small dinner roll.   ? cup cooked rice.    cup peas or beans.   6-inch tortilla (corn or flour).   3 cups popped popcorn.   3 graham crackersquares.   1 cup winter squash.  Dairy  1 cup milk, nonfat, 1%, or 2%.   1 cup lowfat buttermilk.    cup sugar-free yogurt.  Vegetables  1 cup raw or  cup cooked.   1 cup vegetable juice.  Fruit  1 small apple or orange.    medium grapefruit.    cup mango.   2 small tangerines.    cup canned pineapple, in its own juice, or  cup raw pineapple.   1 cup melon or raspberries.   1  cup whole strawberries.  Meat/Protein  2 to 3 oz lean meat, poultry, or fish.   1 egg.      cup cottage cheese.    cup tofu.   1 oz mozzarella cheese.   2 tbs peanut butter.    cup tuna, chicken, or [place] canned in water.  Fat  ? medium avocado or 2 tbs avocado.   1 tsp margarine, butter, or oil.   1 tbs reduced fat mayonnaise or salad dressing.   1 tbs regular cream cheese or 1  tbs reduced fat cream cheese.   1 tbs cashews.   6 almonds.   2 whole pecans.   2 whole walnuts.   10 whole peanuts.  Foods to limit  [employer], such as cakes, muffins, and cookies.   Doughnuts.   Pies.   Baked beans.   Flavored milk.   Pasta or pizza sauce.   Pancakes.   Fruit drinks and soft drinks.   Ice cream.   Candy.   Pudding.   Ketchup.   Syrups such as chocolate and maple.   Honey.   Jelly and jam.   Canned fruit in syrup.   Barbecue sauce.   Teriyaki sauce.  All packaged foods include a list of  ingredients in descending order according their volume by weight. Check the Nutrition Facts panel on the labels of foods you eat. Limit your sugar intake by avoiding those foods that list sugar in the first 3 ingredients. Different names for sugar include: brown sugar, honey, dextrose, glucose, fructose, sucrose, maltose, molasses, maple syrup, corn syrup, and high fructose corn syrup. You may want to include foods sweetened with artificial sweeteners to decrease your intake of concentrated sweets and still satisfy your cravings for sweet food. Sweeteners such as saccharin, aspartame, sucralose, or acesulfame K can be sprinkled on cereal and fruit or added to drinks. These sweeteners have the advantage of adding sweetness without raising your blood glucose. Document Released: 10/09/2005 Document Revised: 06/21/2011 Document Reviewed: 04/29/2009 [facility] Patient Information 2012 [place], [place].

## 2012-02-06 NOTE — Telephone Encounter (Signed)
Pt aware.

## 2012-02-06 NOTE — Progress Notes (Signed)
Subjective:    [name], female    DOB: 1951-07-04, 61 y.o.   MRN: [id number]  HPI Patient comes in today for follow up of  multiple medical problems.  DM:   bg doing better 106 120 ocass 130     sometimes feels low  No meds at present no change in  vision but has cataracts  Scalp: t2 areas of balding on scalp  No itching or change in hair products or  meds    MS no change PSych no change  In meds stable Review of Systems Cataracts in both eyes.  Mom  Had diabetic eye. Left knee right  Leg  No bleeding falling   Has dry mouth and lip motor movements  No other tremor. No foot ulcers or new numbness   Past history family history social history reviewed in the electronic medical record. Outpatient Prescriptions Prior to Visit  Medication Sig Dispense Refill  . ACCU-CHEK AVIVA PLUS test strip CHECK 1-2 TIMES A DAY OR AS DIRECTED  100 strip  11  . CRESTOR 10 MG tablet TAKE 1 TABLET BY MOUTH EVERY DAY  30 tablet  2  . diclofenac sodium (VOLTAREN) 1 % GEL Apply topically.       . docusate sodium (COLACE) 100 MG capsule Take 100 mg by mouth 2 (two) times daily.        . DULoxetine (CYMBALTA) 30 MG capsule Take 30 mg by mouth daily.       . DULoxetine (CYMBALTA) 60 MG capsule Take 60 mg by mouth daily. For a total of 90 mg a day      . fluocinonide (LIDEX) 0.05 % cream Apply topically 2 (two) times daily.       . Lancets (ACCU-CHEK MULTICLIX) lancets 1 each by Other route as needed. Use as instructed       . Probiotic Product (PRO-FLORA CONCENTRATE) CAPS Take 1 capsule by mouth daily. For bowels       . temazepam (RESTORIL) 15 MG capsule Take 15 mg by mouth at bedtime as needed. 1-2 at bedtime       . vitamin B-12 (CYANOCOBALAMIN) 100 MCG tablet Take by mouth daily.       Marland Kitchen aspirin 81 MG chewable tablet Chew 81 mg by mouth daily.             Objective:   Physical Exam BP 112/70  Pulse 99  Temp(Src) 98.5 F (36.9 C) (Oral)  Wt 195 lb (88.451 kg)  SpO2 93%  Wt Readings  from Last 3 Encounters:  02/06/12 195 lb (88.451 kg)  12/14/11 193 lb (87.544 kg)  11/30/11 188 lb (85.276 kg)   WDWN in na d  Has some lip smacking licking at times . Head scalp 2 larege area o alopecia ? No scarring  No flaking  No redness. On top of head and right  Chest:  Clear to A&P without wheezes rales or rhonchi CV:  S1-S2 no gallops or murmurs peripheral perfusion is normal Gait antalgic but can  Walk without cane      Assessment & Plan:  Alopecia   Local   ?  areata   Vs other  Refer  Derm  Dm  Doing better with diet recently    Counseled. On strategy and diet.  HT  Ok. Renal  To see  MS  Walks with cane  Knee and leg back  Disease stable Psych stable at thist time   Update HCM at  PV doesn't seem to be complete in [facility]

## 2012-02-20 ENCOUNTER — Telehealth: Payer: Self-pay | Admitting: *Deleted

## 2012-02-20 NOTE — Telephone Encounter (Signed)
Pt wants to know if the Medicare forms have been filled out that she left for Dr. Fabian Sharp last week.

## 2012-02-23 NOTE — Telephone Encounter (Signed)
Left a message for pt to return call 

## 2012-02-23 NOTE — Telephone Encounter (Signed)
Per Dr. Fabian Sharp called pt to make aware that form had been received and as soon as Dr. Fabian Sharp completes it pt will be notified.  Spoke with pt and she is aware.

## 2012-04-21 ENCOUNTER — Other Ambulatory Visit: Payer: Self-pay | Admitting: Internal Medicine

## 2012-07-10 ENCOUNTER — Encounter: Payer: Medicare Other | Admitting: Internal Medicine

## 2012-07-25 ENCOUNTER — Telehealth: Payer: Self-pay | Admitting: Family Medicine

## 2012-07-25 DIAGNOSIS — E119 Type 2 diabetes mellitus without complications: Secondary | ICD-10-CM

## 2012-07-25 DIAGNOSIS — I10 Essential (primary) hypertension: Secondary | ICD-10-CM

## 2012-07-25 DIAGNOSIS — E785 Hyperlipidemia, unspecified: Secondary | ICD-10-CM

## 2012-07-25 NOTE — Telephone Encounter (Signed)
This patient is not coming in until January.  What would you have me tell her about her lab results?

## 2012-08-07 NOTE — Telephone Encounter (Signed)
i dont see any labs  It appears that she is due for labs . She will need  hg a1c bmp lfts lipid cbcdiff  tsh    Dx dm, renal insufficieny, hyperlipemia , hypertension

## 2012-08-08 NOTE — Telephone Encounter (Signed)
Pt informed, labs ordered.

## 2012-08-08 NOTE — Addendum Note (Signed)
Addended by: Melchor Amour on: 08/08/2012 01:11 PM   Modules accepted: Orders

## 2012-09-09 ENCOUNTER — Other Ambulatory Visit: Payer: Self-pay | Admitting: Family Medicine

## 2012-09-09 MED ORDER — GLUCOSE BLOOD VI STRP: ORAL_STRIP | Status: DC

## 2012-09-12 ENCOUNTER — Telehealth: Payer: Self-pay | Admitting: Internal Medicine

## 2012-09-12 NOTE — Telephone Encounter (Signed)
Patient patient is out of her Glucose Test Strips.  She states she has been trying to get a refill for her test strips. She had  [facility]. They told her it had been 6 months since she had it filled and it would be $61.00 with a coupon.  She called Medicare for assistance.   Medicare has been trying to get her strips refilled.  She states [facility] would not fill it , [facility] or [facility]  part of her  "medicare plan AB and  D".  She is being enrolled in [employer] mail order and was going to send a fax to the office for approval and fill medication.  PATIENT IS CALLING TO SEE IF THE OFFICE RECEIVED THIS FAX.  Please contact her  [phone].

## 2012-09-16 NOTE — Telephone Encounter (Signed)
Patient returned by call and left me a message on my voicemail.  Left her a message on her voicemail.

## 2012-09-16 NOTE — Telephone Encounter (Signed)
Paperwork has been faxed.  Left message for the pt to return my call.

## 2012-09-17 NOTE — Telephone Encounter (Signed)
Patient notified by telephone. 

## 2012-10-30 ENCOUNTER — Other Ambulatory Visit (INDEPENDENT_AMBULATORY_CARE_PROVIDER_SITE_OTHER): Payer: Medicare Other

## 2012-10-30 DIAGNOSIS — I10 Essential (primary) hypertension: Secondary | ICD-10-CM

## 2012-10-30 DIAGNOSIS — E785 Hyperlipidemia, unspecified: Secondary | ICD-10-CM

## 2012-10-30 DIAGNOSIS — E119 Type 2 diabetes mellitus without complications: Secondary | ICD-10-CM

## 2012-10-30 LAB — LIPID PANEL
Cholesterol: 104 mg/dL (ref 0–200)
HDL: 38.5 mg/dL — ABNORMAL LOW (ref >39.00)
LDL Cholesterol: 33 mg/dL (ref 0–99)
Total CHOL/HDL Ratio: 3
Triglycerides: 161 mg/dL — ABNORMAL HIGH (ref 0.0–149.0)
VLDL: 32.2 mg/dL (ref 0.0–40.0)

## 2012-10-30 LAB — CBC WITH DIFFERENTIAL/PLATELET
Basophils Absolute: 0 10*3/uL (ref 0.0–0.1)
Basophils Relative: 0.7 % (ref 0.0–3.0)
Eosinophils Absolute: 0.2 10*3/uL (ref 0.0–0.7)
Eosinophils Relative: 3.4 % (ref 0.0–5.0)
HCT: 37.9 % (ref 36.0–46.0)
Hemoglobin: 12.5 g/dL (ref 12.0–15.0)
Lymphocytes Relative: 50.2 % — ABNORMAL HIGH (ref 12.0–46.0)
Lymphs Abs: 3.1 10*3/uL (ref 0.7–4.0)
MCHC: 33 g/dL (ref 30.0–36.0)
MCV: 90.3 fl (ref 78.0–100.0)
Monocytes Absolute: 0.5 10*3/uL (ref 0.1–1.0)
Monocytes Relative: 7.6 % (ref 3.0–12.0)
Neutro Abs: 2.3 10*3/uL (ref 1.4–7.7)
Neutrophils Relative %: 38.1 % — ABNORMAL LOW (ref 43.0–77.0)
Platelets: 294 10*3/uL (ref 150.0–400.0)
RBC: 4.2 Mil/uL (ref 3.87–5.11)
RDW: 15.7 % — ABNORMAL HIGH (ref 11.5–14.6)
WBC: 6.1 10*3/uL (ref 4.5–10.5)

## 2012-10-30 LAB — HEMOGLOBIN A1C: Hgb A1c MFr Bld: 6.5 % (ref 4.6–6.5)

## 2012-10-30 LAB — TSH: TSH: 3.87 u[IU]/mL (ref 0.35–5.50)

## 2012-10-30 LAB — BASIC METABOLIC PANEL
BUN: 12 mg/dL (ref 6–23)
CO2: 28 mEq/L (ref 19–32)
Calcium: 8.9 mg/dL (ref 8.4–10.5)
Chloride: 106 mEq/L (ref 96–112)
Creatinine, Ser: 1.8 mg/dL — ABNORMAL HIGH (ref 0.4–1.2)
GFR: 38.06 mL/min — ABNORMAL LOW (ref >60.00)
Glucose, Bld: 90 mg/dL (ref 70–99)
Potassium: 3.4 mEq/L — ABNORMAL LOW (ref 3.5–5.1)
Sodium: 141 mEq/L (ref 135–145)

## 2012-11-06 ENCOUNTER — Ambulatory Visit (INDEPENDENT_AMBULATORY_CARE_PROVIDER_SITE_OTHER): Payer: Medicare Other | Admitting: Internal Medicine

## 2012-11-06 ENCOUNTER — Encounter: Payer: Self-pay | Admitting: Internal Medicine

## 2012-11-06 ENCOUNTER — Telehealth: Payer: Self-pay | Admitting: Internal Medicine

## 2012-11-06 VITALS — BP 140/76 | HR 112 | Temp 98.7°F | Ht 63.5 in | Wt 185.0 lb

## 2012-11-06 DIAGNOSIS — N289 Disorder of kidney and ureter, unspecified: Secondary | ICD-10-CM

## 2012-11-06 DIAGNOSIS — E785 Hyperlipidemia, unspecified: Secondary | ICD-10-CM

## 2012-11-06 DIAGNOSIS — I10 Essential (primary) hypertension: Secondary | ICD-10-CM

## 2012-11-06 DIAGNOSIS — N058 Unspecified nephritic syndrome with other morphologic changes: Secondary | ICD-10-CM

## 2012-11-06 DIAGNOSIS — E1129 Type 2 diabetes mellitus with other diabetic kidney complication: Secondary | ICD-10-CM

## 2012-11-06 DIAGNOSIS — E119 Type 2 diabetes mellitus without complications: Secondary | ICD-10-CM

## 2012-11-06 DIAGNOSIS — G2401 Drug induced subacute dyskinesia: Secondary | ICD-10-CM

## 2012-11-06 DIAGNOSIS — D649 Anemia, unspecified: Secondary | ICD-10-CM

## 2012-11-06 DIAGNOSIS — Z0000 Encounter for general adult medical examination without abnormal findings: Secondary | ICD-10-CM

## 2012-11-06 DIAGNOSIS — T43591A Poisoning by other antipsychotics and neuroleptics, accidental (unintentional), initial encounter: Secondary | ICD-10-CM

## 2012-11-06 DIAGNOSIS — E669 Obesity, unspecified: Secondary | ICD-10-CM
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
## 2012-11-06 NOTE — Telephone Encounter (Signed)
Patient's Part D rx insurance, [employer], will pay for her Zostavax, as long as she has a rx for it and [facility]. Please call when Zostavax rx ready for pick up.

## 2012-11-06 NOTE — Patient Instructions (Addendum)
Continue lifestyle intervention healthy eating and exercise . Limit sodas  Try the 10 calorie if absolutely necessary.    dont gain weight while you  Are less mobile.  Kidney function is about the same.  At this time  Increase potassiium rich foods fruits and vegges.   Consider getting your hearing checked.  ROV in 6 months  Hg ac pre visit

## 2012-11-06 NOTE — Telephone Encounter (Signed)
Patient notified to pick up rx at the front desk.

## 2012-11-10 DIAGNOSIS — E1129 Type 2 diabetes mellitus with other diabetic kidney complication: Secondary | ICD-10-CM | POA: Insufficient documentation

## 2012-11-11 ENCOUNTER — Encounter: Payer: Self-pay | Admitting: Internal Medicine

## 2012-11-20 ENCOUNTER — Other Ambulatory Visit: Payer: Self-pay | Admitting: Internal Medicine

## 2013-01-30 ENCOUNTER — Telehealth: Payer: Self-pay | Admitting: Internal Medicine

## 2013-01-30 NOTE — Telephone Encounter (Signed)
Do you want to give samples?

## 2013-01-30 NOTE — Telephone Encounter (Signed)
PT called to inquire about samples of CRESTOR 10 MG tablet. She stated if she does not answer, please leave an answer on her voicemail. Please assist.

## 2013-01-31 NOTE — Telephone Encounter (Signed)
Tell patient that we dont usually give samples except at OVs but  This time we can give her some if we have then  1 months worth   If we need to change we can discuss at next visit

## 2013-02-03 NOTE — Telephone Encounter (Signed)
Left message on home phone for the pt to return my call. 

## 2013-02-03 NOTE — Telephone Encounter (Signed)
Patient notified to pick up at the front desk. 

## 2013-03-03 ENCOUNTER — Encounter (HOSPITAL_COMMUNITY): Payer: Self-pay | Admitting: Emergency Medicine

## 2013-03-03 ENCOUNTER — Emergency Department (HOSPITAL_COMMUNITY)
Admission: EM | Admit: 2013-03-03 | Discharge: 2013-03-03 | Disposition: A | Discharge status: Home / Self Care | Payer: Medicare Other | Attending: Emergency Medicine | Admitting: Emergency Medicine

## 2013-03-03 DIAGNOSIS — Z8371 Family history of colonic polyps: Secondary | ICD-10-CM | POA: Insufficient documentation

## 2013-03-03 DIAGNOSIS — R6883 Chills (without fever): Secondary | ICD-10-CM | POA: Insufficient documentation

## 2013-03-03 DIAGNOSIS — J309 Allergic rhinitis, unspecified: Secondary | ICD-10-CM | POA: Insufficient documentation

## 2013-03-03 DIAGNOSIS — E785 Hyperlipidemia, unspecified: Secondary | ICD-10-CM | POA: Insufficient documentation

## 2013-03-03 DIAGNOSIS — Z880 Allergy status to penicillin: Secondary | ICD-10-CM | POA: Insufficient documentation

## 2013-03-03 DIAGNOSIS — R197 Diarrhea, unspecified: Secondary | ICD-10-CM | POA: Insufficient documentation

## 2013-03-03 DIAGNOSIS — M159 Polyosteoarthritis, unspecified: Secondary | ICD-10-CM | POA: Insufficient documentation

## 2013-03-03 DIAGNOSIS — Z885 Allergy status to narcotic agent status: Secondary | ICD-10-CM | POA: Insufficient documentation

## 2013-03-03 DIAGNOSIS — Z8669 Personal history of other diseases of the nervous system and sense organs: Secondary | ICD-10-CM | POA: Insufficient documentation

## 2013-03-03 DIAGNOSIS — Z79899 Other long term (current) drug therapy: Secondary | ICD-10-CM | POA: Insufficient documentation

## 2013-03-03 DIAGNOSIS — Z9189 Other specified personal risk factors, not elsewhere classified: Secondary | ICD-10-CM | POA: Insufficient documentation

## 2013-03-03 DIAGNOSIS — Z83719 Family history of colon polyps, unspecified: Secondary | ICD-10-CM | POA: Insufficient documentation

## 2013-03-03 DIAGNOSIS — Z87891 Personal history of nicotine dependence: Secondary | ICD-10-CM | POA: Insufficient documentation

## 2013-03-03 DIAGNOSIS — F319 Bipolar disorder, unspecified: Secondary | ICD-10-CM | POA: Insufficient documentation

## 2013-03-03 DIAGNOSIS — K219 Gastro-esophageal reflux disease without esophagitis: Secondary | ICD-10-CM | POA: Insufficient documentation

## 2013-03-03 LAB — CBC WITH DIFFERENTIAL/PLATELET
Basophils Absolute: 0.1 10*3/uL (ref 0.0–0.1)
Basophils Relative: 1 % (ref 0–1)
Eosinophils Absolute: 0 10*3/uL (ref 0.0–0.7)
Eosinophils Relative: 1 % (ref 0–5)
HCT: 42.9 % (ref 36.0–46.0)
Hemoglobin: 14.9 g/dL (ref 12.0–15.0)
Lymphocytes Relative: 51 % — ABNORMAL HIGH (ref 12–46)
Lymphs Abs: 3.9 10*3/uL (ref 0.7–4.0)
MCH: 30.7 pg (ref 26.0–34.0)
MCHC: 34.7 g/dL (ref 30.0–36.0)
MCV: 88.3 fL (ref 78.0–100.0)
Monocytes Absolute: 0.6 10*3/uL (ref 0.1–1.0)
Monocytes Relative: 8 % (ref 3–12)
Neutro Abs: 3.1 10*3/uL (ref 1.7–7.7)
Neutrophils Relative %: 41 % — ABNORMAL LOW (ref 43–77)
Platelets: 327 10*3/uL (ref 150–400)
RBC: 4.86 MIL/uL (ref 3.87–5.11)
RDW: 14.6 % (ref 11.5–15.5)
WBC: 7.7 10*3/uL (ref 4.0–10.5)

## 2013-03-03 LAB — URINALYSIS, ROUTINE W REFLEX MICROSCOPIC
Glucose, UA: NEGATIVE mg/dL
Hgb urine dipstick: NEGATIVE
Ketones, ur: 15 mg/dL — AB
Leukocytes, UA: NEGATIVE
Nitrite: NEGATIVE
Protein, ur: 100 mg/dL — AB
Specific Gravity, Urine: 1.037 — ABNORMAL HIGH (ref 1.005–1.030)
Urobilinogen, UA: 1 mg/dL (ref 0.0–1.0)
pH: 5.5 (ref 5.0–8.0)

## 2013-03-03 LAB — COMPREHENSIVE METABOLIC PANEL
ALT: 29 U/L (ref 0–35)
AST: 65 U/L — ABNORMAL HIGH (ref 0–37)
Albumin: 4.6 g/dL (ref 3.5–5.2)
Alkaline Phosphatase: 100 U/L (ref 39–117)
BUN: 15 mg/dL (ref 6–23)
CO2: 20 mEq/L (ref 19–32)
Calcium: 10.4 mg/dL (ref 8.4–10.5)
Chloride: 103 mEq/L (ref 96–112)
Creatinine, Ser: 1.49 mg/dL — ABNORMAL HIGH (ref 0.50–1.10)
GFR calc Af Amer: 43 mL/min — ABNORMAL LOW (ref >90)
GFR calc non Af Amer: 37 mL/min — ABNORMAL LOW (ref >90)
Glucose, Bld: 111 mg/dL — ABNORMAL HIGH (ref 70–99)
Potassium: 3.4 mEq/L — ABNORMAL LOW (ref 3.5–5.1)
Sodium: 140 mEq/L (ref 135–145)
Total Bilirubin: 0.7 mg/dL (ref 0.3–1.2)
Total Protein: 8.6 g/dL — ABNORMAL HIGH (ref 6.0–8.3)

## 2013-03-03 LAB — URINE MICROSCOPIC-ADD ON

## 2013-03-03 LAB — WET PREP, GENITAL
Clue Cells Wet Prep HPF POC: NONE SEEN
Trich, Wet Prep: NONE SEEN
Yeast Wet Prep HPF POC: NONE SEEN

## 2013-03-03 MED ORDER — ACETAMINOPHEN 325 MG PO TABS
650.0000 mg | ORAL_TABLET | Freq: Once | ORAL | Status: AC
  Administered 2013-03-03: 650 mg via ORAL
  Filled 2013-03-03: qty 2

## 2013-03-03 MED ORDER — SODIUM CHLORIDE 0.9 % IV BOLUS (SEPSIS)
500.0000 mL | Freq: Once | INTRAVENOUS | Status: AC
  Administered 2013-03-03: 500 mL via INTRAVENOUS

## 2013-03-03 NOTE — ED Notes (Signed)
Pt updated on care 

## 2013-03-03 NOTE — ED Notes (Signed)
Pt c/o diarrhea x 1 week with chills; pt shaking intermittently at present

## 2013-03-03 NOTE — ED Provider Notes (Signed)
History     CSN: [id number]  Arrival date & time 03/03/13  1339   First MD Initiated Contact with Patient 03/03/13 1824      Chief Complaint  Patient presents with  . Chills  . Diarrhea    (Consider location/radiation/quality/duration/timing/severity/associated sxs/prior treatment) HPI Comments: Patient presents with a chief complaint of diarrhea.  She reports that she has had two episodes of loose stool daily for the past week.  She denies any abdominal pain.  She has taken her temperature several times over the past week, but has not had a fever.  She denies any vomiting.  No blood or mucus in her stool.  Denies melena.  She denies any recent foreign travel.  Denies any recent hospitalizations.  Denies recent antibiotic use.  She has not taken anything for symptoms prior to arrival.  She also reports that she has noticed a vaginal odor over the past week.  She denies vaginal discharge.  Denies dysuria, increased urinary frequency, or urgency.    The history is provided by the patient.    Past Medical History  Diagnosis Date  . Hyperlipidemia   . GERD (gastroesophageal reflux disease)   . Depression   . HH (hiatus hernia)   . FH: colonic polyps   . Fractured elbow     right   . History of transfusion of packed red blood cells   . Bipolar disorder   . Osteoarthritis of more than one site   . Seasonal allergies   . History of carpal tunnel syndrome   . Neuroleptic-induced tardive dyskinesia     Past Surgical History  Procedure Laterality Date  . Back fusion  2002  . Rt. toe bunion    . Knee surgery    . Juvara osteomy    . Carpal tunnel release  yates    left    Family History  Problem Relation Age of Onset  . Hypertension Father   . Throat cancer Father   . Diabetes Father   . Kidney disease Brother     History  Substance Use Topics  . Smoking status: Former Smoker -- 2.00 packs/day for 20 years    Types: Cigarettes    Quit date: 10/23/2002  . Smokeless  tobacco: Never Used  . Alcohol Use: Not on file    OB History   Grav Para Term Preterm Abortions TAB SAB Ect Mult Living                  Review of Systems  Constitutional: Positive for chills. Negative for fever.  Gastrointestinal: Positive for diarrhea. Negative for nausea, vomiting, abdominal pain, constipation, blood in stool and abdominal distention.  All other systems reviewed and are negative.    Allergies  Codeine; Hydrocodone-acetaminophen; and Penicillins  Home Medications   Current Outpatient Rx  Name  Route  Sig  Dispense  Refill  . dexlansoprazole (DEXILANT) 60 MG capsule   Oral   Take 60 mg by mouth daily.         . DULoxetine (CYMBALTA) 30 MG capsule   Oral   Take 30 mg by mouth daily.          . DULoxetine (CYMBALTA) 60 MG capsule   Oral   Take 60 mg by mouth daily. For a total of 90 mg a day         . fluocinonide (LIDEX) 0.05 % cream   Topical   Apply topically 2 (two) times daily.          Marland Kitchen  neomycin-bacitracin-polymyxin (NEOSPORIN) ointment   Topical   Apply 1 application topically every 12 (twelve) hours.          Marland Kitchen QUEtiapine (SEROQUEL) 100 MG tablet   Oral   Take 100-400 mg by mouth 2 (two) times daily. Takes 400mg  at bedtime, then additional 100mg  as needed.         . rosuvastatin (CRESTOR) 10 MG tablet   Oral   Take 10 mg by mouth daily.         . temazepam (RESTORIL) 15 MG capsule   Oral   Take 15-30 mg by mouth at bedtime as needed for sleep.          . traMADol (ULTRAM) 50 MG tablet   Oral   Take 50 mg by mouth 2 (two) times daily.           BP 134/82  Pulse 107  Temp(Src) 98.5 F (36.9 C) (Oral)  Resp 17  SpO2 100%  Physical Exam  Nursing note and vitals reviewed. Constitutional: She appears well-developed and well-nourished. No distress.  HENT:  Head: Normocephalic and atraumatic.  Neck: Normal range of motion. Neck supple.  Cardiovascular: Normal rate, regular rhythm and normal heart sounds.    Pulmonary/Chest: Effort normal and breath sounds normal.  Abdominal: Soft. Bowel sounds are normal. She exhibits no distension and no mass. There is no tenderness. There is no rebound and no guarding.  Genitourinary: Rectal exam shows no mass and no tenderness. Cervix exhibits no motion tenderness. Right adnexum displays no mass, no tenderness and no fullness. Left adnexum displays no mass, no tenderness and no fullness.  Rectal exam performed, but unable to obtain stool sample  Neurological: She is alert.  Skin: Skin is warm and dry. She is not diaphoretic.  Psychiatric: She has a normal mood and affect.    ED Course  Procedures (including critical care time)  Labs Reviewed  CBC WITH DIFFERENTIAL - Abnormal; Notable for the following:    Neutrophils Relative 41 (*)    Lymphocytes Relative 51 (*)    All other components within normal limits  COMPREHENSIVE METABOLIC PANEL - Abnormal; Notable for the following:    Potassium 3.4 (*)    Glucose, Bld 111 (*)    Creatinine, Ser 1.49 (*)    Total Protein 8.6 (*)    AST 65 (*)    GFR calc non Af Amer 37 (*)    GFR calc Af Amer 43 (*)    All other components within normal limits  URINALYSIS, ROUTINE W REFLEX MICROSCOPIC - Abnormal; Notable for the following:    APPearance HAZY (*)    Specific Gravity, Urine 1.037 (*)    Bilirubin Urine SMALL (*)    Ketones, ur 15 (*)    Protein, ur 100 (*)    All other components within normal limits  URINE MICROSCOPIC-ADD ON - Abnormal; Notable for the following:    Squamous Epithelial / LPF MANY (*)    Bacteria, UA FEW (*)    Casts HYALINE CASTS (*)    All other components within normal limits  URINE CULTURE  GC/CHLAMYDIA PROBE AMP  WET PREP, GENITAL  OCCULT BLOOD X 1 CARD TO LAB, STOOL   No results found.   No diagnosis found.    MDM  Patient presents with a chief complaint of diarrhea.  She reports that she has had two loose stools daily for the past week.  No recent antibiotic use,  hospitalizations, or recent foreign travel.  Patient  is afebrile.  No abdominal pain.  Therefore, do not feel that any imaging is indicated at this time.  Patient stable for discharge.  Return precautions given.        Pascal [place], PA-C 03/04/13 1544

## 2013-03-04 ENCOUNTER — Encounter: Payer: Self-pay | Admitting: Internal Medicine

## 2013-03-04 ENCOUNTER — Telehealth (HOSPITAL_COMMUNITY): Payer: Self-pay | Admitting: Emergency Medicine

## 2013-03-04 ENCOUNTER — Ambulatory Visit (INDEPENDENT_AMBULATORY_CARE_PROVIDER_SITE_OTHER): Payer: Medicare Other | Admitting: Internal Medicine

## 2013-03-04 VITALS — BP 110/60 | HR 106 | Temp 98.2°F | Wt 175.0 lb

## 2013-03-04 DIAGNOSIS — R197 Diarrhea, unspecified: Secondary | ICD-10-CM

## 2013-03-04 DIAGNOSIS — F19239 Other psychoactive substance dependence with withdrawal, unspecified: Secondary | ICD-10-CM

## 2013-03-04 DIAGNOSIS — F19939 Other psychoactive substance use, unspecified with withdrawal, unspecified: Secondary | ICD-10-CM

## 2013-03-04 DIAGNOSIS — Z91120 Patient's intentional underdosing of medication regimen due to financial hardship: Secondary | ICD-10-CM

## 2013-03-04 DIAGNOSIS — R509 Fever, unspecified: Secondary | ICD-10-CM

## 2013-03-04 DIAGNOSIS — Y639 Failure in dosage during unspecified surgical and medical care: Secondary | ICD-10-CM

## 2013-03-04 LAB — GC/CHLAMYDIA PROBE AMP
CT Probe RNA: NEGATIVE
GC Probe RNA: NEGATIVE

## 2013-03-04 NOTE — Patient Instructions (Signed)
I think the diarrhea  Should get betgter in the next  Week or so but some of your sx are from the med withdrawal   . Advise trying the generic cymbalta in the short run  Tos ee if helps with your sx .    Samples of crestor while we have them  ., may need to try a different one in the future .

## 2013-03-04 NOTE — ED Notes (Signed)
Patient states that she is not a smoker and wants the Smoking cessation help taken out of her discharge papers. Informed her that Medical Records could help her and that the hospital places that information on the discharge papers of anyone that has a past history of smoking. Transferred patient to Medical Records.
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
## 2013-03-05 DIAGNOSIS — F19239 Other psychoactive substance dependence with withdrawal, unspecified: Secondary | ICD-10-CM | POA: Insufficient documentation

## 2013-03-05 DIAGNOSIS — R509 Fever, unspecified: Secondary | ICD-10-CM | POA: Insufficient documentation

## 2013-03-05 DIAGNOSIS — Z91120 Patient's intentional underdosing of medication regimen due to financial hardship: Secondary | ICD-10-CM | POA: Insufficient documentation

## 2013-03-05 DIAGNOSIS — R197 Diarrhea, unspecified: Secondary | ICD-10-CM | POA: Insufficient documentation

## 2013-03-05 LAB — URINE CULTURE: Colony Count: 60000

## 2013-03-07 NOTE — ED Provider Notes (Signed)
Medical screening examination/treatment/procedure(s) were performed by non-physician practitioner and as supervising physician I was immediately available for consultation/collaboration.  Myking Sar T Shalee Paolo, MD 03/07/13 0703 

## 2013-03-07 NOTE — ED Notes (Signed)
Post ED Visit - Positive Culture Follow-up  Culture report reviewed by antimicrobial stewardship pharmacist: []  Wes Dulaney, Pharm.D., BCPS [x]  Celedonio Miyamoto, Pharm.D., BCPS []  Georgina Pillion, [address].D., BCPS []  [place], [address].D., BCPS, AAHIVP []  Estella Husk, Pharm.D., BCPS, AAHIV  Positive urine culture  No further patient follow-up is required at this time.  Larena Sox 03/07/2013, 6:56 PM

## 2013-03-24 ENCOUNTER — Telehealth: Payer: Self-pay | Admitting: Internal Medicine

## 2013-03-24 NOTE — Telephone Encounter (Signed)
Patient called Nurse Triage line regarding Dizziness possible medication side effect.  Attempted to call patient back at [phone]. No contact. Left message to please call back for assistance office phone number provided.

## 2013-03-25 ENCOUNTER — Encounter: Payer: Self-pay | Admitting: Internal Medicine

## 2013-03-25 ENCOUNTER — Ambulatory Visit (INDEPENDENT_AMBULATORY_CARE_PROVIDER_SITE_OTHER): Payer: Medicare HMO | Admitting: Internal Medicine

## 2013-03-25 ENCOUNTER — Telehealth: Payer: Self-pay | Admitting: Internal Medicine

## 2013-03-25 VITALS — BP 110/70 | HR 80 | Temp 98.2°F | Resp 16 | Ht 63.5 in | Wt 172.0 lb

## 2013-03-25 DIAGNOSIS — R42 Dizziness and giddiness: Secondary | ICD-10-CM

## 2013-03-25 NOTE — Patient Instructions (Addendum)
Contact your psychiatrist re: adverse effect from generic seroquel Follow up with Dr. Fabian Sharp if experience persistent dizziness

## 2013-03-25 NOTE — Telephone Encounter (Signed)
[facility] Triage Call Report Triage Record Num: [id number] Operator: Hillary Bow Patient Name: Desiree [name] Call Date & Time: 03/24/2013 5:27:25PM Patient Phone: [phone] PCP: Neta Mends. [name] Patient Gender: Female PCP Fax : [phone] Patient DOB: 1951-07-14 [facility]: [facility] Reason for Call: Caller: Ameliana/Patient; PCP: Berniece Andreas (Family Practice); CB#: [phone]; Call regarding Dizziness; onset 03-04-13. Pt was switched from brand name Seroquel and Cymbalta on 5-13 to generic, since switch Pt noticed dizziness after taking med around 7pm daily. Pt discussed w/ Pscychiatrist's Nurse and was advised to f/u w/ PCP. Finger Stick 83. BP 136/86, P93. Pt is currently asymptomatic. Appt offered. Dr Fabian Sharp is not available week of 6-3. Pt would like to wait until Dr Fabian Sharp is back in office on 6-9. Advised Pt to f/u w/ Psychiatrist for advise until PCP is back in office or call back to schedule appt w/ another MD in office if symptoms continued. Pt verbalized understanding. Protocol(s) Used: Information Only Call; No Symptom Triage (Adult) Recommended Outcome per Protocol: Provide Information or Advice Only Reason for Outcome: Health information question; person denies any symptoms, no triage required. Information provided from approved references or clinical experience. Care Advice: ~ 06/

## 2013-03-25 NOTE — Assessment & Plan Note (Signed)
62 year old [place] American female with history of bipolar disorder complains of dizziness after being switched to generic Seroquel. EKG performed. No evidence of QTC prolongation. Patient advised to followup with her psychiatrist regarding restarting brand name Seroquel. If persistent symptoms, patient understands to followup with her primary care physician within 2 weeks.
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
## 2013-04-15 ENCOUNTER — Encounter (HOSPITAL_BASED_OUTPATIENT_CLINIC_OR_DEPARTMENT_OTHER): Payer: Self-pay | Admitting: *Deleted

## 2013-04-15 ENCOUNTER — Emergency Department (HOSPITAL_BASED_OUTPATIENT_CLINIC_OR_DEPARTMENT_OTHER)
Admission: EM | Admit: 2013-04-15 | Discharge: 2013-04-15 | Disposition: A | Discharge status: Home / Self Care | Payer: Medicare HMO | Attending: Emergency Medicine | Admitting: Emergency Medicine

## 2013-04-15 DIAGNOSIS — R197 Diarrhea, unspecified: Secondary | ICD-10-CM

## 2013-04-15 DIAGNOSIS — M159 Polyosteoarthritis, unspecified: Secondary | ICD-10-CM | POA: Insufficient documentation

## 2013-04-15 DIAGNOSIS — Z880 Allergy status to penicillin: Secondary | ICD-10-CM | POA: Insufficient documentation

## 2013-04-15 DIAGNOSIS — F329 Major depressive disorder, single episode, unspecified: Secondary | ICD-10-CM | POA: Insufficient documentation

## 2013-04-15 DIAGNOSIS — Z8719 Personal history of other diseases of the digestive system: Secondary | ICD-10-CM | POA: Insufficient documentation

## 2013-04-15 DIAGNOSIS — Z87891 Personal history of nicotine dependence: Secondary | ICD-10-CM | POA: Insufficient documentation

## 2013-04-15 DIAGNOSIS — J3489 Other specified disorders of nose and nasal sinuses: Secondary | ICD-10-CM

## 2013-04-15 DIAGNOSIS — Z79899 Other long term (current) drug therapy: Secondary | ICD-10-CM | POA: Insufficient documentation

## 2013-04-15 DIAGNOSIS — F319 Bipolar disorder, unspecified: Secondary | ICD-10-CM | POA: Insufficient documentation

## 2013-04-15 DIAGNOSIS — Z8669 Personal history of other diseases of the nervous system and sense organs: Secondary | ICD-10-CM | POA: Insufficient documentation

## 2013-04-15 DIAGNOSIS — Z860100 Personal history of colon polyps, unspecified: Secondary | ICD-10-CM | POA: Insufficient documentation

## 2013-04-15 DIAGNOSIS — F3289 Other specified depressive episodes: Secondary | ICD-10-CM | POA: Insufficient documentation

## 2013-04-15 DIAGNOSIS — Z8781 Personal history of (healed) traumatic fracture: Secondary | ICD-10-CM | POA: Insufficient documentation

## 2013-04-15 DIAGNOSIS — R42 Dizziness and giddiness: Secondary | ICD-10-CM | POA: Insufficient documentation

## 2013-04-15 DIAGNOSIS — R51 Headache: Secondary | ICD-10-CM | POA: Insufficient documentation

## 2013-04-15 DIAGNOSIS — E785 Hyperlipidemia, unspecified: Secondary | ICD-10-CM | POA: Insufficient documentation

## 2013-04-15 DIAGNOSIS — E860 Dehydration: Secondary | ICD-10-CM

## 2013-04-15 DIAGNOSIS — K219 Gastro-esophageal reflux disease without esophagitis: Secondary | ICD-10-CM | POA: Insufficient documentation

## 2013-04-15 LAB — COMPREHENSIVE METABOLIC PANEL
ALT: 16 U/L (ref 0–35)
AST: 30 U/L (ref 0–37)
Albumin: 4.3 g/dL (ref 3.5–5.2)
Alkaline Phosphatase: 97 U/L (ref 39–117)
BUN: 19 mg/dL (ref 6–23)
CO2: 23 mEq/L (ref 19–32)
Calcium: 10.3 mg/dL (ref 8.4–10.5)
Chloride: 102 mEq/L (ref 96–112)
Creatinine, Ser: 1.5 mg/dL — ABNORMAL HIGH (ref 0.50–1.10)
GFR calc Af Amer: 42 mL/min — ABNORMAL LOW (ref >90)
GFR calc non Af Amer: 36 mL/min — ABNORMAL LOW (ref >90)
Glucose, Bld: 96 mg/dL (ref 70–99)
Potassium: 3.8 mEq/L (ref 3.5–5.1)
Sodium: 139 mEq/L (ref 135–145)
Total Bilirubin: 0.3 mg/dL (ref 0.3–1.2)
Total Protein: 8.2 g/dL (ref 6.0–8.3)

## 2013-04-15 LAB — URINALYSIS, ROUTINE W REFLEX MICROSCOPIC
Bilirubin Urine: NEGATIVE
Glucose, UA: NEGATIVE mg/dL
Hgb urine dipstick: NEGATIVE
Ketones, ur: NEGATIVE mg/dL
Leukocytes, UA: NEGATIVE
Nitrite: NEGATIVE
Protein, ur: NEGATIVE mg/dL
Specific Gravity, Urine: 1.023 (ref 1.005–1.030)
Urobilinogen, UA: 0.2 mg/dL (ref 0.0–1.0)
pH: 5.5 (ref 5.0–8.0)

## 2013-04-15 LAB — CBC WITH DIFFERENTIAL/PLATELET
Basophils Absolute: 0 10*3/uL (ref 0.0–0.1)
Basophils Relative: 0 % (ref 0–1)
Eosinophils Absolute: 0.1 10*3/uL (ref 0.0–0.7)
Eosinophils Relative: 2 % (ref 0–5)
HCT: 42.9 % (ref 36.0–46.0)
Hemoglobin: 14.5 g/dL (ref 12.0–15.0)
Lymphocytes Relative: 48 % — ABNORMAL HIGH (ref 12–46)
Lymphs Abs: 3.8 10*3/uL (ref 0.7–4.0)
MCH: 30.8 pg (ref 26.0–34.0)
MCHC: 33.8 g/dL (ref 30.0–36.0)
MCV: 91.1 fL (ref 78.0–100.0)
Monocytes Absolute: 0.7 10*3/uL (ref 0.1–1.0)
Monocytes Relative: 9 % (ref 3–12)
Neutro Abs: 3.3 10*3/uL (ref 1.7–7.7)
Neutrophils Relative %: 41 % — ABNORMAL LOW (ref 43–77)
Platelets: 281 10*3/uL (ref 150–400)
RBC: 4.71 MIL/uL (ref 3.87–5.11)
RDW: 13.9 % (ref 11.5–15.5)
WBC: 8 10*3/uL (ref 4.0–10.5)

## 2013-04-15 LAB — TROPONIN I: Troponin I: <0.3 ng/mL (ref <0.30)

## 2013-04-15 MED ORDER — OXYMETAZOLINE HCL 0.05 % NA SOLN: 2.0000 | Freq: Two times a day (BID) | NASAL | Status: DC

## 2013-04-15 MED ORDER — KETOROLAC TROMETHAMINE 30 MG/ML IJ SOLN
30.0000 mg | Freq: Once | INTRAMUSCULAR | Status: AC
  Administered 2013-04-15: 30 mg via INTRAVENOUS
  Filled 2013-04-15: qty 1

## 2013-04-15 MED ORDER — DIPHENOXYLATE-ATROPINE 2.5-0.025 MG PO TABS: 1.0000 | ORAL_TABLET | Freq: Four times a day (QID) | ORAL | Status: DC | PRN

## 2013-04-15 MED ORDER — SODIUM CHLORIDE 0.9 % IV BOLUS (SEPSIS)
1000.0000 mL | Freq: Once | INTRAVENOUS | Status: AC
  Administered 2013-04-15: 1000 mL via INTRAVENOUS

## 2013-04-15 MED ORDER — MOMETASONE FUROATE 50 MCG/ACT NA SUSP: 2.0000 | Freq: Every day | NASAL | Status: DC

## 2013-04-15 NOTE — ED Provider Notes (Signed)
History    CSN: [id number] Arrival date & time 04/15/13  4098  First MD Initiated Contact with Patient 04/15/13 2005     Chief Complaint  Patient presents with  . Diarrhea   (Consider location/radiation/quality/duration/timing/severity/associated sxs/prior Treatment) HPI Pt present with several day of diarrhea (3 x per day). No blood in stool. Pt now with dizziness(lightheadedness) when standing. She also now c/o l frontal HA that was gradual onset. No photophobia or nausea. No focal weakness or numbness. No abd pain, No fever or chills. No CP, SOB.  Past Medical History  Diagnosis Date  . Hyperlipidemia   . GERD (gastroesophageal reflux disease)   . Depression   . HH (hiatus hernia)   . FH: colonic polyps   . Fractured elbow     right   . History of transfusion of packed red blood cells   . Bipolar disorder   . Osteoarthritis of more than one site   . Seasonal allergies   . History of carpal tunnel syndrome   . Neuroleptic-induced tardive dyskinesia    Past Surgical History  Procedure Laterality Date  . Back fusion  2002  . Rt. toe bunion    . Knee surgery    . Juvara osteomy    . Carpal tunnel release  yates    left   Family History  Problem Relation Age of Onset  . Hypertension Father   . Throat cancer Father   . Diabetes Father   . Kidney disease Brother    History  Substance Use Topics  . Smoking status: Former Smoker -- 2.00 packs/day for 20 years    Types: Cigarettes    Quit date: 10/23/2002  . Smokeless tobacco: Never Used  . Alcohol Use: Not on file   OB History   Grav Para Term Preterm Abortions TAB SAB Ect Mult Living                 Review of Systems  Constitutional: Negative for fever and chills.  HENT: Positive for sinus pressure. Negative for congestion, sore throat, rhinorrhea, neck pain and neck stiffness.   Eyes: Negative for photophobia and visual disturbance.  Respiratory: Negative for cough, shortness of breath and wheezing.    Cardiovascular: Negative for chest pain.  Gastrointestinal: Positive for diarrhea. Negative for nausea, vomiting, abdominal pain, constipation and blood in stool.  Genitourinary: Negative for dysuria, frequency and flank pain.  Musculoskeletal: Negative for myalgias and back pain.  Skin: Negative for rash and wound.  Neurological: Positive for dizziness, light-headedness and headaches. Negative for syncope, weakness and numbness.  All other systems reviewed and are negative.    Allergies  Codeine; Hydrocodone-acetaminophen; and Penicillins  Home Medications   Current Outpatient Rx  Name  Route  Sig  Dispense  Refill  . dexlansoprazole (DEXILANT) 60 MG capsule   Oral   Take 60 mg by mouth daily.         . diphenoxylate-atropine (LOMOTIL) 2.5-0.025 MG per tablet   Oral   Take 1 tablet by mouth 4 (four) times daily as needed for diarrhea or loose stools.   30 tablet   0   . DULoxetine (CYMBALTA) 30 MG capsule   Oral   Take 30 mg by mouth daily.          . DULoxetine (CYMBALTA) 60 MG capsule   Oral   Take 60 mg by mouth daily. For a total of 90 mg a day         .  fluocinonide (LIDEX) 0.05 % cream   Topical   Apply topically 2 (two) times daily.          . mometasone (NASONEX) 50 MCG/ACT nasal spray   Nasal   Place 2 sprays into the nose daily.   17 g   12   . neomycin-bacitracin-polymyxin (NEOSPORIN) ointment   Topical   Apply 1 application topically every 12 (twelve) hours.          Marland Kitchen oxymetazoline (AFRIN NASAL SPRAY) 0.05 % nasal spray   Nasal   Place 2 sprays into the nose 2 (two) times daily.   30 mL   0   . QUEtiapine (SEROQUEL) 100 MG tablet   Oral   Take 100-400 mg by mouth 2 (two) times daily. Takes 400mg  at bedtime, then additional 100mg  as needed.         . rosuvastatin (CRESTOR) 10 MG tablet   Oral   Take 10 mg by mouth daily.         . temazepam (RESTORIL) 15 MG capsule   Oral   Take 15-30 mg by mouth at bedtime as needed for  sleep.          . traMADol (ULTRAM) 50 MG tablet   Oral   Take 50 mg by mouth 2 (two) times daily.          BP 120/76  Pulse 86  Temp(Src) 97.3 F (36.3 C) (Oral)  Resp 18  Wt 172 lb (78.019 kg)  BMI 29.99 kg/m2  SpO2 98% Physical Exam  Nursing note and vitals reviewed. Constitutional: She is oriented to person, place, and time. She appears well-developed and well-nourished. No distress.  HENT:  Head: Normocephalic and atraumatic.  Mouth/Throat: Oropharynx is clear and moist. No oropharyngeal exudate.  Tenderness to percussion over L frontal sinus.  Eyes: EOM are normal. Pupils are equal, round, and reactive to light.  Neck: Normal range of motion. Neck supple.  Cardiovascular: Normal rate and regular rhythm.   Pulmonary/Chest: Effort normal and breath sounds normal. No respiratory distress. She has no wheezes. She has no rales. She exhibits no tenderness.  Abdominal: Soft. Bowel sounds are normal. She exhibits no distension and no mass. There is tenderness (Mild right sided tenderness. No rebound or guarding). There is no rebound and no guarding.  Musculoskeletal: Normal range of motion. She exhibits no edema and no tenderness.  Neurological: She is alert and oriented to person, place, and time.  5/5 motor in all ext, sensation intact  Skin: Skin is warm and dry. No rash noted. No erythema.  Psychiatric: She has a normal mood and affect. Her behavior is normal.    ED Course  Procedures (including critical care time) Labs Reviewed  CBC WITH DIFFERENTIAL - Abnormal; Notable for the following:    Neutrophils Relative % 41 (*)    Lymphocytes Relative 48 (*)    All other components within normal limits  COMPREHENSIVE METABOLIC PANEL - Abnormal; Notable for the following:    Creatinine, Ser 1.50 (*)    GFR calc non Af Amer 36 (*)    GFR calc Af Amer 42 (*)    All other components within normal limits  TROPONIN I  URINALYSIS, ROUTINE W REFLEX MICROSCOPIC   No results  found. 1. Diarrhea   2. Dehydration   3. Sinus pressure     MDM  Pt ambulating well. Dizziness improved. Pt advised to f/u with PMD and return for concerns.   Loren Racer, MD 04/15/13 2237

## 2013-04-15 NOTE — ED Notes (Signed)
Pt ambulatory to restroom and back no difficulty, denies dizziness.

## 2013-04-15 NOTE — Discharge Instructions (Signed)
Dehydration, Adult  Dehydration means your body does not have as much fluid as it needs. Your kidneys, brain, and heart will not work properly without the right amount of fluids and salt.   HOME CARE   Ask your doctor how to replace body fluid losses (rehydrate).   Drink enough fluids to keep your pee (urine) clear or pale yellow.   Drink small amounts of fluids often if you feel sick to your stomach (nauseous) or throw up (vomit).   Eat like you normally do.   Avoid:   Foods or drinks high in sugar.   Bubbly (carbonated) drinks.   Juice.   Very hot or cold fluids.   Drinks with caffeine.   Fatty, greasy foods.   Alcohol.   Tobacco.   Eating too much.   Gelatin desserts.   Wash your hands to avoid spreading germs (bacteria, viruses).   Only take medicine as told by your doctor.   Keep all doctor visits as told.  GET HELP RIGHT AWAY IF:    You cannot drink something without throwing up.   You get worse even with treatment.   Your vomit has blood in it or looks greenish.   Your poop (stool) has blood in it or looks black and tarry.   You have not peed in 6 to 8 hours.   You pee a small amount of very dark pee.   You have a fever.   You pass out (faint).   You have belly (abdominal) pain that gets worse or stays in one spot (localizes).   You have a rash, stiff neck, or bad headache.   You get easily annoyed, sleepy, or are hard to wake up.   You feel weak, dizzy, or very thirsty.  MAKE SURE YOU:    Understand these instructions.   Will watch your condition.   Will get help right away if you are not doing well or get worse.  Document Released: 08/05/2009 Document Revised: 01/01/2012 Document Reviewed: 05/29/2011  Exit[employer] Patient Information 2014 [employer].

## 2013-04-15 NOTE — ED Notes (Addendum)
Pt c/o HA, diarrhea, sweating, dizziness, and fatigue since Saturday. Pt had similar episode last month and was treated for dehydration. Pt also c/o chronic back pain.

## 2013-04-16 ENCOUNTER — Telehealth: Payer: Self-pay | Admitting: Internal Medicine

## 2013-04-16 NOTE — Telephone Encounter (Signed)
[facility] Triage Call Report Triage Record Num: [id number] Operator: Aundra Millet Patient Name: Desiree [name] Call Date & Time: 04/15/2013 5:18:15PM Patient Phone: [phone] PCP: Neta Mends. [name] Patient Gender: Female PCP Fax : [phone] Patient DOB: 1951/10/06 [facility] Name: [facility] Reason for Call: Caller: Desiree [name]/Patient; PCP: Berniece Andreas (Family Practice); CB#: [phone]; Today, 04/15/2013, pt calling with onset of Diarrhea since 04/13/2013 with 3 loose stools daily. She has been trying to drink more fluids. NO vomiting. Feels lightheaded and fatiqued since 04/11/2013. Feeling lighthead with standing with dry mouth. Last urinated ~ 1600. Seen had ER visit 03/03/2013 for Diarrhea x 7 days, and had to have IVF.RN reached See ED Immediately for signs of dehydration per Diarrhea or other Change in Bowel Habits protocol and advised to be seen at [facility] ED . Pt stating she may go to [facility] instead. Protocol(s) Used: Diarrhea or Other Change in Bowel Habits Recommended Outcome per Protocol: See ED Immediately Reason for Outcome: Signs of dehydration Care Advice: ~ Protect the patient from falling or other harm. ~ Another adult should drive. Call EMS 911 if signs and symptoms of shock develop (such as unable to stand due to faintness, dizziness, or lightheadedness; new onset of confusion; slow to respond or difficult to awaken; skin is pale, gray, cool, or moist to touch; severe weakness; loss of consciousness). ~ Change position slowly. Sit for a couple of minutes before standing to walk. Quick position changes may cause or worsen symptoms. ~ ~ IMMEDIATE ACTION Take sips of clear liquids (such as water, clear fruit juices without pulp, soda, tea or coffee without dairy or non-dairy creamer, clear broth or bouillon, oral hydration solution, or plain gelatin, fruit ices/popsicles, hard candy) as tolerated. Sucking on ice chips is another option. ~ 06/

## 2013-04-17 ENCOUNTER — Telehealth: Payer: Self-pay | Admitting: Internal Medicine

## 2013-04-17 NOTE — Telephone Encounter (Signed)
Patient Information:  Caller Name: Steffanie  Phone: [phone]  Patient: Desiree [name], Desiree [name]  Gender: Female  DOB: 06-Aug-1951  Age: 62 Years  PCP: Berniece Andreas (Family Practice)  Office Follow Up:  Does the office need to follow up with this patient?: Yes  Instructions For The Office: Pt was instructed by the ED Dr. Loren Racer ( from 04/15/13 visit to "discuss"  Crestor with  Dr. Fabian Sharp, but pt is not sure why. I could not find any info the EPIC char rearding this.  Pt comes to this office 04/29/13 for labs work and then 05/06/13 for an appt with Dr. Fabian Sharp to review results.   Symptoms  Reason For Call & Symptoms: Pt call the other day and was advised to go ot the ED . She was diagnosed with dehydration and diarrhea and ED Dr. wants her to "discuss" with her PCP.  Reviewed Health History In EMR: N/A  Reviewed Medications In EMR: N/A  Reviewed Allergies In EMR: N/A  Reviewed Surgeries / Procedures: N/A  Date of Onset of Symptoms: Unknown  Guideline(s) Used:  No Protocol Available - Information Only  Disposition Per Guideline:   Discuss with PCP and Callback by Nurse Today  Reason For Disposition Reached:   Nursing judgment  Advice Given:  Call Back If:  New symptoms develop  Patient Will Follow Care Advice:  YES

## 2013-04-29 ENCOUNTER — Other Ambulatory Visit: Payer: Medicare Other

## 2013-05-06 ENCOUNTER — Ambulatory Visit: Payer: Medicare Other | Admitting: Internal Medicine

## 2013-06-03 ENCOUNTER — Other Ambulatory Visit (INDEPENDENT_AMBULATORY_CARE_PROVIDER_SITE_OTHER): Payer: Medicare HMO

## 2013-06-03 ENCOUNTER — Telehealth: Payer: Self-pay | Admitting: Internal Medicine

## 2013-06-03 DIAGNOSIS — IMO0001 Reserved for inherently not codable concepts without codable children: Secondary | ICD-10-CM

## 2013-06-03 LAB — HEMOGLOBIN A1C: Hgb A1c MFr Bld: 6.6 % — ABNORMAL HIGH (ref 4.6–6.5)

## 2013-06-03 NOTE — Telephone Encounter (Signed)
PT stopped by to request samples of Crestor 10mg . Please assist.

## 2013-06-03 NOTE — Telephone Encounter (Signed)
Patient has an appt on 06/10/13.  Pt given 1 week of samples.  Notified to pick up at the front desk.

## 2013-06-06 ENCOUNTER — Telehealth: Payer: Self-pay | Admitting: Internal Medicine

## 2013-06-06 ENCOUNTER — Ambulatory Visit (INDEPENDENT_AMBULATORY_CARE_PROVIDER_SITE_OTHER): Payer: Medicare Other | Admitting: Family Medicine

## 2013-06-06 ENCOUNTER — Encounter: Payer: Self-pay | Admitting: Family Medicine

## 2013-06-06 VITALS — BP 108/80 | Temp 98.5°F | Wt 162.0 lb

## 2013-06-06 DIAGNOSIS — J069 Acute upper respiratory infection, unspecified: Secondary | ICD-10-CM

## 2013-06-06 DIAGNOSIS — J0100 Acute maxillary sinusitis, unspecified: Secondary | ICD-10-CM

## 2013-06-06 DIAGNOSIS — J309 Allergic rhinitis, unspecified: Secondary | ICD-10-CM

## 2013-06-06 MED ORDER — DOXYCYCLINE HYCLATE 100 MG PO TABS: 100.0000 mg | ORAL_TABLET | Freq: Two times a day (BID) | ORAL | Status: DC

## 2013-06-06 NOTE — Telephone Encounter (Signed)
If pharmacist advised trying with food, ok with that. If vomited within minutes of taking, maybe was more gagging response from pill then side effect to medication - perhaps take in a spoonful of applesauce to help pill go down. Likely a viral infection, treating just in case bacterial. So, can follow pharmacist recs and wait before next dose if she likes.

## 2013-06-06 NOTE — Patient Instructions (Addendum)
INSTRUCTIONS FOR UPPER RESPIRATORY INFECTION:  -As we discussed, we have prescribed a new medication (doxycycline and antibiotic for you sinuses) for you at this appointment. We discussed the common and serious potential adverse effects of this medication and you can review these and more with the pharmacist when you pick up your medication.  Please follow the instructions for use carefully and notify us immediately if you have any problems taking this medication.  -plenty of rest and fluids  -nasal saline wash 2-3 times daily (use prepackaged nasal saline or bottled/distilled water if making your own)   -can use afrin nasal spray for drainage and nasal congestion - but do NOT use longer then 3-4 days  -can use tylenol or ibuprofen as directed for aches and sorethroat  -if you are taking a cough medication - use only as directed, may also try a teaspoon of honey to coat the throat and throat lozenges  -for sore throat, salt water gargles can help  -follow up if you have fevers, facial pain, tooth pain, difficulty breathing or are worsening or not getting better in 5-7 days

## 2013-06-06 NOTE — Telephone Encounter (Signed)
Desiree [name]/Patient Phone([phone]) [phone] called after office visit 06/06/13 to report she vomited once within minutes of taking 1st Doxycycline dose at approximately 1015.  She called her pharmacist who advised to wait 12 hours before taking next dose and take next dose with food. Rx directions say to take 1 hour before eating or 2 hour after eating.  Has not eaten anything so far today due to no appetite but getting ready to eat some lunch now.  Please call back to advise about next antibiotic dose and if to take with or without food.

## 2013-06-06 NOTE — Telephone Encounter (Signed)
Called and spoke with pt and pt is aware.  

## 2013-06-06 NOTE — Telephone Encounter (Signed)
Left a message for pt to return call 
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
## 2013-06-10 ENCOUNTER — Ambulatory Visit (INDEPENDENT_AMBULATORY_CARE_PROVIDER_SITE_OTHER): Payer: Medicare Other | Admitting: Internal Medicine

## 2013-06-10 ENCOUNTER — Encounter: Payer: Self-pay | Admitting: Internal Medicine

## 2013-06-10 VITALS — BP 108/70 | HR 95 | Temp 98.3°F | Wt 166.0 lb

## 2013-06-10 DIAGNOSIS — J988 Other specified respiratory disorders: Secondary | ICD-10-CM

## 2013-06-10 DIAGNOSIS — N289 Disorder of kidney and ureter, unspecified: Secondary | ICD-10-CM

## 2013-06-10 DIAGNOSIS — N058 Unspecified nephritic syndrome with other morphologic changes: Secondary | ICD-10-CM

## 2013-06-10 DIAGNOSIS — E1129 Type 2 diabetes mellitus with other diabetic kidney complication: Secondary | ICD-10-CM

## 2013-06-10 DIAGNOSIS — I10 Essential (primary) hypertension: Secondary | ICD-10-CM
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
## 2013-06-10 NOTE — Patient Instructions (Addendum)
Expect our respiratory infection to improve still. Contact us  If high fever or relapsing    Blood sugar is in good control.  Labs ; OV in 6 months or as needed .

## 2013-06-25 ENCOUNTER — Encounter: Payer: Self-pay | Admitting: Gastroenterology

## 2013-07-08 ENCOUNTER — Telehealth: Payer: Self-pay | Admitting: Internal Medicine

## 2013-07-08 ENCOUNTER — Emergency Department (HOSPITAL_COMMUNITY)
Admission: EM | Admit: 2013-07-08 | Discharge: 2013-07-08 | Disposition: A | Discharge status: Home / Self Care | Payer: Medicare Other | Attending: Emergency Medicine | Admitting: Emergency Medicine

## 2013-07-08 ENCOUNTER — Encounter (HOSPITAL_COMMUNITY): Payer: Self-pay | Admitting: Emergency Medicine

## 2013-07-08 DIAGNOSIS — Y929 Unspecified place or not applicable: Secondary | ICD-10-CM | POA: Insufficient documentation

## 2013-07-08 DIAGNOSIS — Z8739 Personal history of other diseases of the musculoskeletal system and connective tissue: Secondary | ICD-10-CM | POA: Insufficient documentation

## 2013-07-08 DIAGNOSIS — F3289 Other specified depressive episodes: Secondary | ICD-10-CM | POA: Insufficient documentation

## 2013-07-08 DIAGNOSIS — Z8709 Personal history of other diseases of the respiratory system: Secondary | ICD-10-CM | POA: Insufficient documentation

## 2013-07-08 DIAGNOSIS — T23202A Burn of second degree of left hand, unspecified site, initial encounter: Secondary | ICD-10-CM

## 2013-07-08 DIAGNOSIS — Z79899 Other long term (current) drug therapy: Secondary | ICD-10-CM | POA: Insufficient documentation

## 2013-07-08 DIAGNOSIS — F329 Major depressive disorder, single episode, unspecified: Secondary | ICD-10-CM | POA: Insufficient documentation

## 2013-07-08 DIAGNOSIS — Z860100 Personal history of colon polyps, unspecified: Secondary | ICD-10-CM | POA: Insufficient documentation

## 2013-07-08 DIAGNOSIS — T23209A Burn of second degree of unspecified hand, unspecified site, initial encounter: Secondary | ICD-10-CM | POA: Insufficient documentation

## 2013-07-08 DIAGNOSIS — X12XXXA Contact with other hot fluids, initial encounter: Secondary | ICD-10-CM | POA: Insufficient documentation

## 2013-07-08 DIAGNOSIS — Z8781 Personal history of (healed) traumatic fracture: Secondary | ICD-10-CM | POA: Insufficient documentation

## 2013-07-08 DIAGNOSIS — Z87891 Personal history of nicotine dependence: Secondary | ICD-10-CM | POA: Insufficient documentation

## 2013-07-08 DIAGNOSIS — E785 Hyperlipidemia, unspecified: Secondary | ICD-10-CM | POA: Insufficient documentation

## 2013-07-08 DIAGNOSIS — Z9889 Other specified postprocedural states: Secondary | ICD-10-CM | POA: Insufficient documentation

## 2013-07-08 DIAGNOSIS — Y9389 Activity, other specified: Secondary | ICD-10-CM | POA: Insufficient documentation

## 2013-07-08 DIAGNOSIS — K219 Gastro-esophageal reflux disease without esophagitis: Secondary | ICD-10-CM | POA: Insufficient documentation

## 2013-07-08 DIAGNOSIS — Z8669 Personal history of other diseases of the nervous system and sense organs: Secondary | ICD-10-CM | POA: Insufficient documentation

## 2013-07-08 DIAGNOSIS — IMO0002 Reserved for concepts with insufficient information to code with codable children: Secondary | ICD-10-CM | POA: Insufficient documentation

## 2013-07-08 MED ORDER — SILVER SULFADIAZINE 1 % EX CREA: TOPICAL_CREAM | Freq: Every day | CUTANEOUS | Status: DC
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
## 2013-07-08 NOTE — Telephone Encounter (Signed)
Per WP, pt should be seen in UC or ED.  Tried reaching the patient.  Left voicemail.  Will try again tomorrow morning.
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
## 2013-07-09 NOTE — Telephone Encounter (Signed)
Left message at below listed number for the pt to return my call. 

## 2013-07-09 NOTE — ED Provider Notes (Signed)
Medical screening examination/treatment/procedure(s) were performed by non-physician practitioner and as supervising physician I was immediately available for consultation/collaboration.   Junius Argyle, MD 07/09/13 1147

## 2013-07-09 NOTE — Telephone Encounter (Signed)
Spoke to the pt.  She was seen at [facility].  Has made a follow up appt with WP on 07/11/13

## 2013-07-11 ENCOUNTER — Ambulatory Visit (INDEPENDENT_AMBULATORY_CARE_PROVIDER_SITE_OTHER): Payer: Medicare Other | Admitting: Internal Medicine

## 2013-07-11 ENCOUNTER — Encounter: Payer: Self-pay | Admitting: Internal Medicine

## 2013-07-11 VITALS — BP 120/74 | HR 84 | Temp 98.4°F | Wt 164.0 lb

## 2013-07-11 DIAGNOSIS — E119 Type 2 diabetes mellitus without complications: Secondary | ICD-10-CM

## 2013-07-11 DIAGNOSIS — T300 Burn of unspecified body region, unspecified degree: Secondary | ICD-10-CM | POA: Insufficient documentation

## 2013-07-11 DIAGNOSIS — Z23 Encounter for immunization: Secondary | ICD-10-CM
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
## 2013-07-29 ENCOUNTER — Ambulatory Visit (INDEPENDENT_AMBULATORY_CARE_PROVIDER_SITE_OTHER): Payer: Medicare Other

## 2013-07-29 VITALS — BP 120/71 | HR 75 | Resp 20 | Ht 63.5 in | Wt 156.0 lb

## 2013-07-29 DIAGNOSIS — E1142 Type 2 diabetes mellitus with diabetic polyneuropathy: Secondary | ICD-10-CM

## 2013-07-29 DIAGNOSIS — L608 Other nail disorders: Secondary | ICD-10-CM

## 2013-07-29 DIAGNOSIS — E1149 Type 2 diabetes mellitus with other diabetic neurological complication: Secondary | ICD-10-CM

## 2013-07-29 DIAGNOSIS — Q828 Other specified congenital malformations of skin: Secondary | ICD-10-CM

## 2013-07-29 NOTE — Progress Notes (Signed)
  Subjective:    [name], female    DOB: [birth year], 63 y.o.   MRN: [id number]  HPI    Review of Systems  Constitutional: Positive for fatigue.  HENT: Positive for sneezing.   Eyes: Negative.   Respiratory: Negative.   Cardiovascular: Positive for chest pain.  Gastrointestinal: Negative.   Endocrine: Positive for cold intolerance.  Genitourinary: Negative.   Musculoskeletal: Positive for back pain and joint swelling.  Skin: Negative.   Allergic/Immunologic: Positive for immunocompromised state.  Neurological: Positive for dizziness.  Hematological: Bruises/bleeds easily.  Psychiatric/Behavioral: Positive for behavioral problems and sleep disturbance. The patient is nervous/anxious.        Objective:   Physical Exam        Assessment & Plan:

## 2013-07-29 NOTE — Patient Instructions (Signed)
Diabetes and Foot Care  Diabetes may cause you to have a poor blood supply (circulation) to your legs and feet. Because of this, the skin may be thinner, break easier, and heal more slowly. You also may have nerve damage in your legs and feet causing decreased feeling. You may not notice minor injuries to your feet that could lead to serious problems or infections. Taking care of your feet is one of the most important things you can do for yourself.   HOME CARE INSTRUCTIONS   Do not go barefoot. Bare feet are easily injured.   Check your feet daily for blisters, cuts, and redness.   Wash your feet with warm water (not hot) and mild soap. Pat your feet and between your toes until completely dry.   Apply a moisturizing lotion that does not contain alcohol or petroleum jelly to the dry skin on your feet and to dry brittle toenails. Do not put it between your toes.   Trim your toenails straight across. Do not dig under them or around the cuticle.   Do not cut corns or calluses, or try to remove them with medicine.   Wear clean cotton socks or stockings every day. Make sure they are not too tight. Do not wear knee high stockings since they may decrease blood flow to your legs.   Wear leather shoes that fit properly and have enough cushioning. To break in new shoes, wear them just a few hours a day to avoid injuring your feet.   Wear shoes at all times, even in the house.   Do not cross your legs. This may decrease the blood flow to your feet.   If you find a minor scrape, cut, or break in the skin on your feet, keep it and the skin around it clean and dry. These areas may be cleansed with mild soap and water. Do not use peroxide, alcohol, iodine or Merthiolate.   When you remove an adhesive bandage, be sure not to harm the skin around it.   If you have a wound, look at it several times a day to make sure it is healing.   Do not use heating pads or hot water bottles. Burns can occur. If you have lost feeling  in your feet or legs, you may not know it is happening until it is too late.   Report any cuts, sores or bruises to your caregiver. Do not wait!  SEEK MEDICAL CARE IF:    You have an injury that is not healing or you notice redness, numbness, burning, or tingling.   Your feet always feel cold.   You have pain or cramps in your legs and feet.  SEEK IMMEDIATE MEDICAL CARE IF:    There is increasing redness, swelling, or increasing pain in the wound.   There is a red line that goes up your leg.   Pus is coming from a wound.   You develop an unexplained oral temperature above 102 F (38.9 C), or as your caregiver suggests.   You notice a bad smell coming from an ulcer or wound.  MAKE SURE YOU:    Understand these instructions.   Will watch your condition.   Will get help right away if you are not doing well or get worse.  Document Released: 10/06/2000 Document Revised: 01/01/2012 Document Reviewed: 04/14/2009  Exit[employer] Patient Information 2014 [employer]

## 2013-08-01 ENCOUNTER — Emergency Department (HOSPITAL_COMMUNITY): Payer: Medicare Other

## 2013-08-01 ENCOUNTER — Emergency Department (HOSPITAL_COMMUNITY)
Admission: EM | Admit: 2013-08-01 | Discharge: 2013-08-01 | Disposition: A | Discharge status: Home / Self Care | Payer: Medicare Other | Attending: Emergency Medicine | Admitting: Emergency Medicine

## 2013-08-01 ENCOUNTER — Encounter (HOSPITAL_COMMUNITY): Payer: Self-pay | Admitting: Emergency Medicine

## 2013-08-01 ENCOUNTER — Telehealth: Payer: Self-pay | Admitting: Internal Medicine

## 2013-08-01 DIAGNOSIS — F319 Bipolar disorder, unspecified: Secondary | ICD-10-CM | POA: Insufficient documentation

## 2013-08-01 DIAGNOSIS — J069 Acute upper respiratory infection, unspecified: Secondary | ICD-10-CM | POA: Insufficient documentation

## 2013-08-01 DIAGNOSIS — R0789 Other chest pain: Secondary | ICD-10-CM | POA: Insufficient documentation

## 2013-08-01 DIAGNOSIS — R61 Generalized hyperhidrosis: Secondary | ICD-10-CM | POA: Insufficient documentation

## 2013-08-01 DIAGNOSIS — R0602 Shortness of breath: Secondary | ICD-10-CM

## 2013-08-01 DIAGNOSIS — M549 Dorsalgia, unspecified: Secondary | ICD-10-CM | POA: Insufficient documentation

## 2013-08-01 DIAGNOSIS — R5381 Other malaise: Secondary | ICD-10-CM | POA: Insufficient documentation

## 2013-08-01 DIAGNOSIS — Z8739 Personal history of other diseases of the musculoskeletal system and connective tissue: Secondary | ICD-10-CM | POA: Insufficient documentation

## 2013-08-01 DIAGNOSIS — IMO0002 Reserved for concepts with insufficient information to code with codable children: Secondary | ICD-10-CM | POA: Insufficient documentation

## 2013-08-01 DIAGNOSIS — Z79899 Other long term (current) drug therapy: Secondary | ICD-10-CM | POA: Insufficient documentation

## 2013-08-01 DIAGNOSIS — E785 Hyperlipidemia, unspecified: Secondary | ICD-10-CM | POA: Insufficient documentation

## 2013-08-01 DIAGNOSIS — Z8669 Personal history of other diseases of the nervous system and sense organs: Secondary | ICD-10-CM | POA: Insufficient documentation

## 2013-08-01 DIAGNOSIS — K219 Gastro-esophageal reflux disease without esophagitis: Secondary | ICD-10-CM | POA: Insufficient documentation

## 2013-08-01 DIAGNOSIS — Z860100 Personal history of colon polyps, unspecified: Secondary | ICD-10-CM | POA: Insufficient documentation

## 2013-08-01 DIAGNOSIS — Z8781 Personal history of (healed) traumatic fracture: Secondary | ICD-10-CM | POA: Insufficient documentation

## 2013-08-01 DIAGNOSIS — Z87891 Personal history of nicotine dependence: Secondary | ICD-10-CM | POA: Insufficient documentation

## 2013-08-01 LAB — D-DIMER, QUANTITATIVE (NOT AT ARMC): D-Dimer, Quant: 0.52 ug/mL-FEU — ABNORMAL HIGH (ref 0.00–0.48)

## 2013-08-01 LAB — CBC
HCT: 37.1 % (ref 36.0–46.0)
Hemoglobin: 12.5 g/dL (ref 12.0–15.0)
MCH: 29.9 pg (ref 26.0–34.0)
MCHC: 33.7 g/dL (ref 30.0–36.0)
MCV: 88.8 fL (ref 78.0–100.0)
Platelets: 267 10*3/uL (ref 150–400)
RBC: 4.18 MIL/uL (ref 3.87–5.11)
RDW: 13.5 % (ref 11.5–15.5)
WBC: 8.7 10*3/uL (ref 4.0–10.5)

## 2013-08-01 LAB — BASIC METABOLIC PANEL
BUN: 12 mg/dL (ref 6–23)
CO2: 26 mEq/L (ref 19–32)
Calcium: 9.4 mg/dL (ref 8.4–10.5)
Chloride: 102 mEq/L (ref 96–112)
Creatinine, Ser: 1.37 mg/dL — ABNORMAL HIGH (ref 0.50–1.10)
GFR calc Af Amer: 47 mL/min — ABNORMAL LOW (ref >90)
GFR calc non Af Amer: 41 mL/min — ABNORMAL LOW (ref >90)
Glucose, Bld: 105 mg/dL — ABNORMAL HIGH (ref 70–99)
Potassium: 3.2 mEq/L — ABNORMAL LOW (ref 3.5–5.1)
Sodium: 137 mEq/L (ref 135–145)

## 2013-08-01 LAB — POCT I-STAT TROPONIN I: Troponin i, poc: 0.02 ng/mL (ref 0.00–0.08)

## 2013-08-01 MED ORDER — IOHEXOL 350 MG/ML SOLN
100.0000 mL | Freq: Once | INTRAVENOUS | Status: AC | PRN
  Administered 2013-08-01: 100 mL via INTRAVENOUS

## 2013-08-01 MED ORDER — OXYCODONE-ACETAMINOPHEN 5-325 MG PO TABS
1.0000 | ORAL_TABLET | Freq: Once | ORAL | Status: AC
  Administered 2013-08-01: 1 via ORAL
  Filled 2013-08-01: qty 1

## 2013-08-01 MED ORDER — OXYCODONE-ACETAMINOPHEN 5-325 MG PO TABS: 1.0000 | ORAL_TABLET | ORAL | Status: DC | PRN

## 2013-08-01 NOTE — ED Provider Notes (Signed)
  Face-to-face evaluation   History: chest pain, for several weeks. She cannot recall exactly how long. The pain is constant day and night. She's never had cardiac disease other than mitral valve prolapse.her doctor suggested that she come here. The chest pain is in her epigastrium and radiates to the chest bilaterally.  Physical exam:exam alert, cooperative, chest and abdomen are nontender.   Medical screening examination/treatment/procedure(s) were conducted as a shared visit with non-physician practitioner(s) and myself.  I personally evaluated the patient during the encounter  Flint Melter, MD 08/01/13 2146

## 2013-08-01 NOTE — ED Notes (Signed)
Patient transported to X-ray 
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
## 2013-08-01 NOTE — Progress Notes (Signed)
*  PRELIMINARY RESULTS* Vascular Ultrasound Lower extremity venous duplex has been completed.  Preliminary findings: negative for DVT    Desiree [name], Desiree [name] 08/01/2013, 7:03 PM

## 2013-08-01 NOTE — ED Notes (Signed)
PA at bedside.

## 2013-08-01 NOTE — ED Provider Notes (Signed)
CSN: [id number]     Arrival date & time 08/01/13  1511 History   First MD Initiated Contact with Patient 08/01/13 1524     Chief Complaint  Patient presents with  . Chest Pain  . Cough   (Consider location/radiation/quality/duration/timing/severity/associated sxs/prior Treatment) HPI Comments: Patient here after calling his PCP and reporting 3 weeks worth of chest pain.  She reports that the pain is to her "ribs" below her breasts and now radiates around to her back.  She states that starting about 1 week ago she noticed a productive cough which worsened over the past week.  She states she is coughing up yellow sputum.  She reports shortness of breath more with exertion, fever with diaphoresis last night and once today.  She reports no previous history of CAD.  She called her PCP who became concerned.  She reports bilateral leg pain with increase in swelling which she reports happens occasionally.  She is concerned that she may have pneumonia.  Also complains of sore throat with no difficulty swallowing.  Patient is a 63 y.o. female presenting with chest pain and cough. The history is provided by the patient. No language interpreter was used.  Chest Pain Pain location:  L chest Pain quality: radiating and stabbing   Pain quality: not aching   Pain radiates to:  Upper back Pain radiates to the back: yes   Pain severity:  Moderate Onset quality:  Gradual Duration:  3 weeks Timing:  Constant Progression:  Worsening Chronicity:  New Context: breathing and movement   Relieved by:  Nothing Worsened by:  Nothing tried Ineffective treatments:  None tried Associated symptoms: back pain, cough, diaphoresis, fatigue, fever, shortness of breath and weakness   Associated symptoms: no abdominal pain, no anorexia, no dizziness, no nausea, no orthopnea, no syncope and not vomiting   Risk factors: diabetes mellitus   Cough Associated symptoms: chest pain, diaphoresis, fever and shortness of breath       Past Medical History  Diagnosis Date  . Hyperlipidemia   . GERD (gastroesophageal reflux disease)   . Depression   . HH (hiatus hernia)   . FH: colonic polyps   . Fractured elbow     right   . History of transfusion of packed red blood cells   . Bipolar disorder   . Osteoarthritis of more than one site   . Seasonal allergies   . History of carpal tunnel syndrome   . Neuroleptic-induced tardive dyskinesia    Past Surgical History  Procedure Laterality Date  . Back fusion  2002  . Rt. toe bunion    . Knee surgery    . Juvara osteomy    . Carpal tunnel release  yates    left  . External ear surgery Left   . Finger surgery Left    Family History  Problem Relation Age of Onset  . Hypertension Father   . Throat cancer Father   . Diabetes Father   . Kidney disease Brother    History  Substance Use Topics  . Smoking status: Former Smoker -- 2.00 packs/day for 20 years    Types: Cigarettes    Quit date: 10/23/2002  . Smokeless tobacco: Never Used  . Alcohol Use: No   OB History   Grav Para Term Preterm Abortions TAB SAB Ect Mult Living                 Review of Systems  Constitutional: Positive for fever, diaphoresis and fatigue.  Respiratory: Positive for cough and shortness of breath.   Cardiovascular: Positive for chest pain. Negative for orthopnea and syncope.  Gastrointestinal: Negative for nausea, vomiting, abdominal pain and anorexia.  Musculoskeletal: Positive for back pain.  Neurological: Positive for weakness. Negative for dizziness.  All other systems reviewed and are negative.    Allergies  Codeine; Hydrocodone-acetaminophen; and Penicillins  Home Medications   Current Outpatient Rx  Name  Route  Sig  Dispense  Refill  . diphenoxylate-atropine (LOMOTIL) 2.5-0.025 MG per tablet   Oral   Take 1 tablet by mouth 4 (four) times daily as needed for diarrhea or loose stools.   30 tablet   0   . esomeprazole (NEXIUM) 40 MG capsule   Oral    Take 40 mg by mouth daily before breakfast.         . mirtazapine (REMERON) 15 MG tablet   Oral   Take 15 mg by mouth at bedtime.         . mometasone (NASONEX) 50 MCG/ACT nasal spray   Nasal   Place 2 sprays into the nose daily.   17 g   12   . Multiple Vitamins-Minerals (CENTRUM SILVER PO)   Oral   Take 1 tablet by mouth daily.          Marland Kitchen neomycin-bacitracin-polymyxin (NEOSPORIN) ointment   Topical   Apply 1 application topically every 12 (twelve) hours.          Marland Kitchen oxymetazoline (AFRIN NASAL SPRAY) 0.05 % nasal spray   Nasal   Place 2 sprays into the nose 2 (two) times daily.   30 mL   0   . rosuvastatin (CRESTOR) 10 MG tablet   Oral   Take 10 mg by mouth daily.         . traMADol (ULTRAM) 50 MG tablet   Oral   Take 50 mg by mouth 2 (two) times daily as needed for pain.          . traZODone (DESYREL) 50 MG tablet   Oral   Take 50 mg by mouth at bedtime.          BP 114/73  Pulse 86  Temp(Src) 99.1 F (37.3 C) (Oral)  Resp 18  SpO2 97% Physical Exam  Nursing note and vitals reviewed. Constitutional: She is oriented to person, place, and time. She appears well-developed and well-nourished. No distress.  HENT:  Head: Normocephalic and atraumatic.  Right Ear: External ear normal.  Left Ear: External ear normal.  Nose: Nose normal.  Mouth/Throat: No oropharyngeal exudate.  tonsilar pillar erythema without exudate  Eyes: Conjunctivae are normal. Pupils are equal, round, and reactive to light. No scleral icterus.  Neck: Normal range of motion. Neck supple.  Left anterior cervical lymphadenopathy  Cardiovascular: Normal rate, regular rhythm and normal heart sounds.  Exam reveals no gallop and no friction rub.   No murmur heard. Pulmonary/Chest: Effort normal and breath sounds normal. No respiratory distress. She has no wheezes. She has no rales. She exhibits tenderness.    Abdominal: Soft. Bowel sounds are normal. She exhibits no distension.  There is no tenderness.  Musculoskeletal: Normal range of motion. She exhibits tenderness. She exhibits no edema.  Mild calf tenderness, negative Homan's  Lymphadenopathy:    She has cervical adenopathy.  Neurological: She is alert and oriented to person, place, and time. No cranial nerve deficit. She exhibits normal muscle tone. Coordination normal.  Skin: Skin is warm and dry. No rash noted. No erythema.  No pallor.  Psychiatric: She has a normal mood and affect. Her behavior is normal. Judgment and thought content normal.    ED Course  Procedures (including critical care time) Labs Review Labs Reviewed  CBC  BASIC METABOLIC PANEL  D-DIMER, QUANTITATIVE   Imaging Review No results found.  EKG Interpretation   None      Date: 08/01/2013  Rate: 84  Rhythm: Sinus  QRS Axis: normal  Intervals: normal  ST/T Wave abnormalities: LVH, otherwise normal  Conduction Disutrbances:PVC's  Narrative Interpretation: Reviewed by Dr. Rubin Payor  Old EKG Reviewed: Previous on 04/15/2013 shows prolonged QT    MDM  Chest pain Viral URI  Patient here with several week history of left chest pain with radiation to her back.  She also reports shortness of breath and lower extremity edema.  D-dimer here negative but venous doppler and chest CT are negative for thombus.  Troponin here is also negative and no significant ECG changes do not suggest a cardiac etiology.  She does have a small amount of pharyngeal erythema, this likely represents a viral pharyngitis, with the cough, probable viral URI.  She reports pain relief with narcotic pain medication here, will give short course and she will follow up with PCP this coming week.  Izola Price Marisue Humble, [place] 08/01/13 1610

## 2013-08-01 NOTE — ED Notes (Addendum)
Pt reports left sided chest pain with radiation to the back that began three weeks ago. Pt states the pain has been intermittent with cough. Pt also reports diaphoresis, intermittent shortness of breath, and dizziness. Pt reports a history of being a smoker, having elevated cholesterol, and diabetes. Pt is A/O x4, talking in complete sentences, oxygen saturation of 97% on room air.

## 2013-08-01 NOTE — ED Notes (Signed)
Ultrasound at bedside

## 2013-08-04 ENCOUNTER — Telehealth: Payer: Self-pay | Admitting: Internal Medicine

## 2013-08-04 NOTE — Telephone Encounter (Signed)
Desiree [name]/Patient Phone [phone] called for ED follow up visit.  Seen in ED  08/01/13 to rule out pneumonia.  Diagnosed with URI and muscle pain.  Instructed to follow up with PCP in 2-3 days.  Also asking for balance due.  Transferred to office scheduler queue for ED follow up appointment.

## 2013-08-04 NOTE — Telephone Encounter (Signed)
Should patient be seen in 2 days.  Please advise.  Thanks!

## 2013-08-04 NOTE — Telephone Encounter (Signed)
Pt was in ed on Fri (instructed by CAN) andadvised to fu w/ pcp in 2 days. Pt does not have pneumonia. No 30 min appts. Pls advise.

## 2013-08-04 NOTE — Telephone Encounter (Signed)
I reviewed her emergency room notes. It doesn't appear that she has heart problems or pneumonia Can put her on the schedule for Thursday or Friday block 30 minutes for followup emergency room

## 2013-08-06 ENCOUNTER — Ambulatory Visit (INDEPENDENT_AMBULATORY_CARE_PROVIDER_SITE_OTHER): Payer: Medicare Other | Admitting: Internal Medicine

## 2013-08-06 ENCOUNTER — Encounter: Payer: Self-pay | Admitting: Internal Medicine

## 2013-08-06 VITALS — BP 94/52 | HR 103 | Temp 98.2°F | Wt 162.0 lb

## 2013-08-06 DIAGNOSIS — B9789 Other viral agents as the cause of diseases classified elsewhere: Secondary | ICD-10-CM

## 2013-08-06 DIAGNOSIS — J329 Chronic sinusitis, unspecified: Secondary | ICD-10-CM

## 2013-08-06 DIAGNOSIS — R599 Enlarged lymph nodes, unspecified: Secondary | ICD-10-CM

## 2013-08-06 DIAGNOSIS — R590 Localized enlarged lymph nodes: Secondary | ICD-10-CM

## 2013-08-06 DIAGNOSIS — IMO0001 Reserved for inherently not codable concepts without codable children: Secondary | ICD-10-CM

## 2013-08-06 DIAGNOSIS — E876 Hypokalemia: Secondary | ICD-10-CM

## 2013-08-06 DIAGNOSIS — R079 Chest pain, unspecified: Secondary | ICD-10-CM

## 2013-08-06 LAB — BASIC METABOLIC PANEL
BUN: 16 mg/dL (ref 6–23)
CO2: 28 mEq/L (ref 19–32)
Calcium: 9.5 mg/dL (ref 8.4–10.5)
Chloride: 104 mEq/L (ref 96–112)
Creatinine, Ser: 1.6 mg/dL — ABNORMAL HIGH (ref 0.4–1.2)
GFR: 40.92 mL/min — ABNORMAL LOW (ref >60.00)
Glucose, Bld: 93 mg/dL (ref 70–99)
Potassium: 4.2 mEq/L (ref 3.5–5.1)
Sodium: 141 mEq/L (ref 135–145)

## 2013-08-06 LAB — CK: Total CK: 99 U/L (ref 7–177)

## 2013-08-06 MED ORDER — OXYCODONE-ACETAMINOPHEN 5-325 MG PO TABS: 1.0000 | ORAL_TABLET | Freq: Four times a day (QID) | ORAL | Status: DC | PRN

## 2013-08-06 MED ORDER — CEFUROXIME AXETIL 250 MG PO TABS: 250.0000 mg | ORAL_TABLET | Freq: Two times a day (BID) | ORAL | Status: DC

## 2013-08-06 NOTE — Patient Instructions (Signed)
The illness does sound like a viral   resp infection   .  Possible sinus infection on the left with the swollen gland and face pain.  Care with pain pills and only take at night if needed.
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
## 2013-08-25 ENCOUNTER — Telehealth: Payer: Self-pay | Admitting: Internal Medicine

## 2013-08-25 NOTE — Telephone Encounter (Signed)
Patient Information:  Caller Name: Jakaylee  Phone: [phone]  Patient: Desiree [name], Desiree [name]  Gender: Female  DOB: 09/21/1951  Age: 62 Years  PCP: Berniece Andreas (Family Practice)  Office Follow Up:  Does the office need to follow up with this patient?: No  Instructions For The Office: N/A   Symptoms  Reason For Call & Symptoms: Pt is calling and states that she has an itchy throat, runny nose and sneezing; sx started 08/21/2013;  Reviewed Health History In EMR: Yes  Reviewed Medications In EMR: Yes  Reviewed Allergies In EMR: Yes  Reviewed Surgeries / Procedures: Yes  Date of Onset of Symptoms: 08/21/2013  Guideline(s) Used:  Colds  Disposition Per Guideline:   Home Care  Reason For Disposition Reached:   Colds with no complications  Advice Given:  Reassurance  It sounds like an uncomplicated cold that we can treat at home.  Colds are very common and may make you feel uncomfortable.  For a Stuffy Nose - Use Nasal Washes:  Introduction: Saline (salt water) nasal irrigation (nasal wash) is an effective and simple home remedy for treating stuffy nose and sinus congestion. The nose can be irrigated by pouring, spraying, or squirting salt water into the nose and then letting it run back out.  How it Helps: The salt water rinses out excess mucus, washes out any irritants (dust, allergens) that might be present, and moistens the nasal cavity.  Treatment for Associated Symptoms of Colds:  For muscle aches, headaches, or moderate fever (more than 101 F or 38.9 C): Take acetaminophen every 4 hours.  Cough: Use cough drops.  Hydrate: Drink adequate liquids.  Humidifier:  If the air in your home is dry, use a cool-mist humidifier  Expected Course:   Nasal discharge 7-14 days  Cough up to 2-3 weeks.  Call Back If:  Difficulty breathing occurs  You become worse  Cough Medicines:  Home Remedy - Hard Candy: Hard candy works just as well as medicine-flavored OTC cough drops. Diabetics  should use sugar-free candy.  Patient Will Follow Care Advice:  YES

## 2013-10-08 ENCOUNTER — Telehealth: Payer: Self-pay | Admitting: Internal Medicine

## 2013-10-08 NOTE — Telephone Encounter (Signed)
Pt is having occasional lower abdominal pain. Pt would like to see you when you return. Pt refused to see anyone else, or speak w/ triage. Pt is not having pain at present, but states sometimes when she stands up. pls advise.

## 2013-10-14 NOTE — Telephone Encounter (Signed)
Can schedule ov  In the next 2 weeks   If not a problem currently

## 2013-10-15 NOTE — Telephone Encounter (Signed)
Please call and schedule in next 2 weeks per dr Fabian Sharp

## 2013-10-15 NOTE — Telephone Encounter (Signed)
lmom/kh 

## 2013-10-15 NOTE — Telephone Encounter (Signed)
appt scheduled

## 2013-10-21 ENCOUNTER — Ambulatory Visit (INDEPENDENT_AMBULATORY_CARE_PROVIDER_SITE_OTHER): Payer: Medicare Other | Admitting: Internal Medicine

## 2013-10-21 ENCOUNTER — Encounter: Payer: Self-pay | Admitting: Internal Medicine

## 2013-10-21 VITALS — BP 126/74 | Temp 98.9°F | Wt 161.0 lb

## 2013-10-21 DIAGNOSIS — H938X9 Other specified disorders of ear, unspecified ear: Secondary | ICD-10-CM | POA: Insufficient documentation

## 2013-10-21 DIAGNOSIS — N058 Unspecified nephritic syndrome with other morphologic changes: Secondary | ICD-10-CM

## 2013-10-21 DIAGNOSIS — I10 Essential (primary) hypertension: Secondary | ICD-10-CM

## 2013-10-21 DIAGNOSIS — E1129 Type 2 diabetes mellitus with other diabetic kidney complication: Secondary | ICD-10-CM

## 2013-10-21 DIAGNOSIS — H938X3 Other specified disorders of ear, bilateral: Secondary | ICD-10-CM

## 2013-10-21 DIAGNOSIS — R1031 Right lower quadrant pain: Secondary | ICD-10-CM | POA: Insufficient documentation

## 2013-10-21 DIAGNOSIS — N289 Disorder of kidney and ureter, unspecified: Secondary | ICD-10-CM

## 2013-10-21 DIAGNOSIS — R1032 Left lower quadrant pain: Secondary | ICD-10-CM

## 2013-10-21 MED ORDER — GLUCOSE BLOOD VI STRP: ORAL_STRIP | Status: DC
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
## 2013-10-21 NOTE — Patient Instructions (Signed)
Wt Readings from Last 3 Encounters:  10/21/13 161 lb (73.029 kg)  08/06/13 162 lb (73.483 kg)  07/29/13 156 lb (70.761 kg)  ears look good today. abd sx sound like some type of abdominal wall strain  No hernia noted  Today  Monitor  And weill reevaluate at your next visit. If  persistent or progressive reevaluate . Contact us   Since your gyne doesn't see a gyne problem and no change in bowels we can observe over time .

## 2013-10-22 ENCOUNTER — Telehealth: Payer: Self-pay | Admitting: Internal Medicine

## 2013-10-22 ENCOUNTER — Other Ambulatory Visit: Payer: Self-pay | Admitting: Family Medicine

## 2013-10-22 MED ORDER — GLUCOSE BLOOD VI STRP: ORAL_STRIP | Status: DC

## 2013-10-22 NOTE — Telephone Encounter (Signed)
[facility] requesting updated fax for diabetic test strips with the dx code and exact directions included. Fax# [phone]

## 2013-10-22 NOTE — Telephone Encounter (Signed)
Re sent to the pharmacy by e-scribe. 

## 2013-10-28 ENCOUNTER — Ambulatory Visit: Payer: Medicare Other

## 2013-11-09 ENCOUNTER — Emergency Department (HOSPITAL_COMMUNITY)
Admission: EM | Admit: 2013-11-09 | Discharge: 2013-11-09 | Disposition: A | Discharge status: Home / Self Care | Payer: Medicare Other | Attending: Emergency Medicine | Admitting: Emergency Medicine

## 2013-11-09 ENCOUNTER — Encounter (HOSPITAL_COMMUNITY): Payer: Self-pay | Admitting: Emergency Medicine

## 2013-11-09 DIAGNOSIS — IMO0002 Reserved for concepts with insufficient information to code with codable children: Secondary | ICD-10-CM | POA: Insufficient documentation

## 2013-11-09 DIAGNOSIS — F3289 Other specified depressive episodes: Secondary | ICD-10-CM | POA: Insufficient documentation

## 2013-11-09 DIAGNOSIS — F329 Major depressive disorder, single episode, unspecified: Secondary | ICD-10-CM | POA: Insufficient documentation

## 2013-11-09 DIAGNOSIS — F29 Unspecified psychosis not due to a substance or known physiological condition: Secondary | ICD-10-CM | POA: Insufficient documentation

## 2013-11-09 DIAGNOSIS — Z8739 Personal history of other diseases of the musculoskeletal system and connective tissue: Secondary | ICD-10-CM | POA: Insufficient documentation

## 2013-11-09 DIAGNOSIS — R112 Nausea with vomiting, unspecified: Secondary | ICD-10-CM | POA: Insufficient documentation

## 2013-11-09 DIAGNOSIS — E785 Hyperlipidemia, unspecified: Secondary | ICD-10-CM | POA: Insufficient documentation

## 2013-11-09 DIAGNOSIS — R4589 Other symptoms and signs involving emotional state: Secondary | ICD-10-CM | POA: Insufficient documentation

## 2013-11-09 DIAGNOSIS — G2589 Other specified extrapyramidal and movement disorders: Secondary | ICD-10-CM | POA: Insufficient documentation

## 2013-11-09 DIAGNOSIS — Z880 Allergy status to penicillin: Secondary | ICD-10-CM | POA: Insufficient documentation

## 2013-11-09 DIAGNOSIS — K219 Gastro-esophageal reflux disease without esophagitis: Secondary | ICD-10-CM | POA: Insufficient documentation

## 2013-11-09 DIAGNOSIS — Z87891 Personal history of nicotine dependence: Secondary | ICD-10-CM | POA: Insufficient documentation

## 2013-11-09 DIAGNOSIS — T43205A Adverse effect of unspecified antidepressants, initial encounter: Secondary | ICD-10-CM | POA: Insufficient documentation

## 2013-11-09 DIAGNOSIS — Z79899 Other long term (current) drug therapy: Secondary | ICD-10-CM | POA: Insufficient documentation

## 2013-11-09 DIAGNOSIS — Z792 Long term (current) use of antibiotics: Secondary | ICD-10-CM | POA: Insufficient documentation

## 2013-11-09 DIAGNOSIS — Z8669 Personal history of other diseases of the nervous system and sense organs: Secondary | ICD-10-CM | POA: Insufficient documentation

## 2013-11-09 DIAGNOSIS — R109 Unspecified abdominal pain: Secondary | ICD-10-CM | POA: Insufficient documentation

## 2013-11-09 DIAGNOSIS — R509 Fever, unspecified: Secondary | ICD-10-CM | POA: Insufficient documentation

## 2013-11-09 DIAGNOSIS — Z860100 Personal history of colon polyps, unspecified: Secondary | ICD-10-CM | POA: Insufficient documentation

## 2013-11-09 DIAGNOSIS — R259 Unspecified abnormal involuntary movements: Secondary | ICD-10-CM | POA: Insufficient documentation

## 2013-11-09 DIAGNOSIS — Z789 Other specified health status: Secondary | ICD-10-CM

## 2013-11-09 DIAGNOSIS — Z8781 Personal history of (healed) traumatic fracture: Secondary | ICD-10-CM | POA: Insufficient documentation

## 2013-11-09 DIAGNOSIS — G2579 Other drug induced movement disorders: Secondary | ICD-10-CM

## 2013-11-09 DIAGNOSIS — R51 Headache: Secondary | ICD-10-CM | POA: Insufficient documentation

## 2013-11-09 DIAGNOSIS — R197 Diarrhea, unspecified: Secondary | ICD-10-CM | POA: Insufficient documentation

## 2013-11-09 LAB — COMPREHENSIVE METABOLIC PANEL
ALT: 9 U/L (ref 0–35)
AST: 19 U/L (ref 0–37)
Albumin: 3.7 g/dL (ref 3.5–5.2)
Alkaline Phosphatase: 73 U/L (ref 39–117)
BUN: 11 mg/dL (ref 6–23)
CO2: 22 mEq/L (ref 19–32)
Calcium: 9.1 mg/dL (ref 8.4–10.5)
Chloride: 104 mEq/L (ref 96–112)
Creatinine, Ser: 1.42 mg/dL — ABNORMAL HIGH (ref 0.50–1.10)
GFR calc Af Amer: 45 mL/min — ABNORMAL LOW (ref >90)
GFR calc non Af Amer: 39 mL/min — ABNORMAL LOW (ref >90)
Glucose, Bld: 96 mg/dL (ref 70–99)
Potassium: 3.7 mEq/L (ref 3.7–5.3)
Sodium: 140 mEq/L (ref 137–147)
Total Bilirubin: 0.2 mg/dL — ABNORMAL LOW (ref 0.3–1.2)
Total Protein: 7.1 g/dL (ref 6.0–8.3)

## 2013-11-09 LAB — CBC WITH DIFFERENTIAL/PLATELET
Basophils Absolute: 0 10*3/uL (ref 0.0–0.1)
Basophils Relative: 1 % (ref 0–1)
Eosinophils Absolute: 0.2 10*3/uL (ref 0.0–0.7)
Eosinophils Relative: 2 % (ref 0–5)
HCT: 37 % (ref 36.0–46.0)
Hemoglobin: 12.5 g/dL (ref 12.0–15.0)
Lymphocytes Relative: 54 % — ABNORMAL HIGH (ref 12–46)
Lymphs Abs: 3.8 10*3/uL (ref 0.7–4.0)
MCH: 29.4 pg (ref 26.0–34.0)
MCHC: 33.8 g/dL (ref 30.0–36.0)
MCV: 87.1 fL (ref 78.0–100.0)
Monocytes Absolute: 0.4 10*3/uL (ref 0.1–1.0)
Monocytes Relative: 6 % (ref 3–12)
Neutro Abs: 2.7 10*3/uL (ref 1.7–7.7)
Neutrophils Relative %: 38 % — ABNORMAL LOW (ref 43–77)
Platelets: 295 10*3/uL (ref 150–400)
RBC: 4.25 MIL/uL (ref 3.87–5.11)
RDW: 13.7 % (ref 11.5–15.5)
WBC: 7 10*3/uL (ref 4.0–10.5)

## 2013-11-09 MED ORDER — ZOLPIDEM TARTRATE 5 MG PO TABS: 5.0000 mg | ORAL_TABLET | Freq: Every evening | ORAL | Status: DC | PRN

## 2013-11-09 NOTE — ED Notes (Signed)
Pt reports abdominal pain, chest pain, back pain, diarrhea x1 week. Reports pain 9/10.

## 2013-11-09 NOTE — Discharge Instructions (Signed)
Stop the trazodone medication.  Serotonin Syndrome Serotonin is a brain chemical that regulates the nervous system. Some kinds of drugs increase the amount of serotonin in your body. Drugs that increase the serotonin in your body include:   Anti-depressant medications.  St. John's wort.  Recreational drugs.  Migraine medicines.  Some pain medicines. SYMPTOMS Combining these drugs increases the risk that you will become ill with a toxic condition called serotonin syndrome.  Symptoms of too much serotonin include:  Confusion.  Agitation.  Weakness.  Insomnia.  Fever.  Sweats. Other symptoms that may develop include:  Shakiness.  Muscle spasms.  Seizures.  Mental status changes: Confusion & disorientation Agitation & restlessness Excitement Neuromuscular changes: Akathisia Tremors Autonomic instability: Diaphoresis Hypertension Fever Nausea, Vomiting and Diarrhea New onset or worsening headach TREATMENT  Avoiding the combination of medicines listed above is recommended.  Check with your doctor if you are concerned about your medicine or the side effects. Document Released: 11/16/2004 Document Revised: 01/01/2012 Document Reviewed: 10/09/2005 [facility] Patient Information 2014 [place], [place].

## 2013-11-09 NOTE — ED Provider Notes (Signed)
CSN: [id number]     Arrival date & time 11/09/13  1756 History   First MD Initiated Contact with Patient 11/09/13 2003     Chief Complaint  Patient presents with  . Chest Pain  . Abdominal Pain  . Diarrhea   HPI Patient has several week history of some confusion restlessness along with tremors fevers nausea vomiting diarrhea some headache.  Is on multiple medications.  Been to her doctor with no improvement.  Saw her GI doctor last week and has an appointment for a colonoscopy.  Has been in the emergency room for diarrhea and other abdominal complaints several times. Past Medical History  Diagnosis Date  . Hyperlipidemia   . GERD (gastroesophageal reflux disease)   . Depression   . HH (hiatus hernia)   . FH: colonic polyps   . Fractured elbow     right   . History of transfusion of packed red blood cells   . Bipolar disorder   . Osteoarthritis of more than one site   . Seasonal allergies   . History of carpal tunnel syndrome   . Neuroleptic-induced tardive dyskinesia    Past Surgical History  Procedure Laterality Date  . Back fusion  2002  . Rt. toe bunion    . Knee surgery    . Juvara osteomy    . Carpal tunnel release  yates    left  . External ear surgery Left   . Finger surgery Left    Family History  Problem Relation Age of Onset  . Hypertension Father   . Throat cancer Father   . Diabetes Father   . Kidney disease Brother    History  Substance Use Topics  . Smoking status: Former Smoker -- 2.00 packs/day for 20 years    Types: Cigarettes    Quit date: 10/23/2002  . Smokeless tobacco: Never Used  . Alcohol Use: No   OB History   Grav Para Term Preterm Abortions TAB SAB Ect Mult Living                 Review of Systems All other systems reviewed and are ne Allergies  Codeine; Hydrocodone-acetaminophen; and Penicillins  Home Medications   Current Outpatient Rx  Name  Route  Sig  Dispense  Refill  . diphenoxylate-atropine (LOMOTIL) 2.5-0.025 MG per  tablet   Oral   Take 1 tablet by mouth 4 (four) times daily as needed for diarrhea or loose stools.   30 tablet   0   . esomeprazole (NEXIUM) 40 MG capsule   Oral   Take 40 mg by mouth daily before breakfast.         . glucose blood test strip      Test once daily   100 each   5     FOR ACCU-CHECK  DX CODE:  250.40   . mirtazapine (REMERON) 15 MG tablet   Oral   Take 15 mg by mouth at bedtime.         . mometasone (NASONEX) 50 MCG/ACT nasal spray   Nasal   Place 2 sprays into the nose daily.   17 g   12   . Multiple Vitamins-Minerals (CENTRUM SILVER PO)   Oral   Take 1 tablet by mouth daily.          Marland Kitchen neomycin-bacitracin-polymyxin (NEOSPORIN) ointment   Topical   Apply 1 application topically every 12 (twelve) hours.          Marland Kitchen oxymetazoline (AFRIN  NASAL SPRAY) 0.05 % nasal spray   Nasal   Place 2 sprays into the nose 2 (two) times daily.   30 mL   0   . prazosin (MINIPRESS) 1 MG capsule               . promethazine (PHENERGAN) 25 MG tablet   Oral   Take 25 mg by mouth every 6 (six) hours as needed for nausea or vomiting (nausea).         . rosuvastatin (CRESTOR) 10 MG tablet   Oral   Take 10 mg by mouth daily.         . traMADol (ULTRAM) 50 MG tablet   Oral   Take 50 mg by mouth 2 (two) times daily as needed for pain.          Marland Kitchen zolpidem (AMBIEN) 5 MG tablet   Oral   Take 1 tablet (5 mg total) by mouth at bedtime as needed for sleep.   30 tablet   0    BP 131/89  Pulse 66  Temp(Src) 98.3 F (36.8 C) (Oral)  Resp 14  SpO2 98% Physical Exam  Nursing note and vitals reviewed. Constitutional: She is oriented to person, place, and time. She appears well-developed and well-nourished. No distress.  HENT:  Head: Normocephalic and atraumatic.  Eyes: Pupils are equal, round, and reactive to light.  Neck: Normal range of motion.  Cardiovascular: Normal rate and intact distal pulses.   Pulmonary/Chest: No respiratory distress.   Abdominal: Normal appearance. She exhibits no distension. There is no tenderness. There is no rebound and no guarding.  Musculoskeletal: Normal range of motion.  Neurological: She is alert and oriented to person, place, and time. No cranial nerve deficit.  Skin: Skin is warm and dry. No rash noted.  Psychiatric: She has a normal mood and affect. Her behavior is normal.    ED Course  Procedures (including critical care time) Labs Review Labs Reviewed  CBC WITH DIFFERENTIAL - Abnormal; Notable for the following:    Neutrophils Relative % 38 (*)    Lymphocytes Relative 54 (*)    All other components within normal limits  COMPREHENSIVE METABOLIC PANEL - Abnormal; Notable for the following:    Creatinine, Ser 1.42 (*)    Total Bilirubin 0.2 (*)    GFR calc non Af Amer 39 (*)    GFR calc Af Amer 45 (*)    All other components within normal limits   Imaging Review No results found.  EKG Interpretation    Date/Time:  Sunday November 09 2013 18:10:06 EST Ventricular Rate:  90 PR Interval:  147 QRS Duration: 80 QT Interval:  417 QTC Calculation: 510 R Axis:   -22 Text Interpretation:  Sinus rhythm Borderline left axis deviation Prolonged QT interval Baseline wander in lead(s) II III aVF V6 No significant change since last tracing Confirmed by Jamiaya Bina  MD, Ameliya Nicotra ([id number]) on 11/09/2013 8:18:13 PM           review of her medication showed that the trazodone was interacting with several of her medicines to cause an increase in serotonin.  I suspect this may be a mild serotonin syndrome.  Plan we'll stop the trazodone and replaced with Ambien for sleep at night.  MDM   1. Serotonin syndrome   2. Drug interaction        Nelia Shi, MD 11/09/13 2157

## 2013-11-10 ENCOUNTER — Telehealth: Payer: Self-pay | Admitting: Internal Medicine

## 2013-11-10 NOTE — Telephone Encounter (Signed)
FYI:   Pt seen in ED last night for stomach pain.   Pt had. Serotonin syndrome and drug interaction. They removed trazodone from her med list. Pt was rx this med by dr reddy/  Pt was prescribed zolpidem Remus Loffler) 5mg  by ed doctor Pt states pain is better.

## 2013-11-10 NOTE — Telephone Encounter (Signed)
FYI! This has been updated in her chart.

## 2013-11-18 ENCOUNTER — Ambulatory Visit (INDEPENDENT_AMBULATORY_CARE_PROVIDER_SITE_OTHER): Payer: Medicare Other

## 2013-11-18 VITALS — BP 115/63 | HR 75 | Resp 16

## 2013-11-18 DIAGNOSIS — E1149 Type 2 diabetes mellitus with other diabetic neurological complication: Secondary | ICD-10-CM

## 2013-11-18 DIAGNOSIS — L608 Other nail disorders: Secondary | ICD-10-CM

## 2013-11-18 DIAGNOSIS — E1142 Type 2 diabetes mellitus with diabetic polyneuropathy: Secondary | ICD-10-CM

## 2013-11-18 DIAGNOSIS — Q828 Other specified congenital malformations of skin: Secondary | ICD-10-CM

## 2013-11-18 NOTE — Patient Instructions (Signed)
Diabetes and Foot Care Diabetes may cause you to have problems because of poor blood supply (circulation) to your feet and legs. This may cause the skin on your feet to become thinner, break easier, and heal more slowly. Your skin may become dry, and the skin may peel and crack. You may also have nerve damage in your legs and feet causing decreased feeling in them. You may not notice minor injuries to your feet that could lead to infections or more serious problems. Taking care of your feet is one of the most important things you can do for yourself.  HOME CARE INSTRUCTIONS  Wear shoes at all times, even in the house. Do not go barefoot. Bare feet are easily injured.  Check your feet daily for blisters, cuts, and redness. If you cannot see the bottom of your feet, use a mirror or ask someone for help.  Wash your feet with warm water (do not use hot water) and mild soap. Then pat your feet and the areas between your toes until they are completely dry. Do not soak your feet as this can dry your skin.  Apply a moisturizing lotion or petroleum jelly (that does not contain alcohol and is unscented) to the skin on your feet and to dry, brittle toenails. Do not apply lotion between your toes.  Trim your toenails straight across. Do not dig under them or around the cuticle. File the edges of your nails with an emery board or nail file.  Do not cut corns or calluses or try to remove them with medicine.  Wear clean socks or stockings every day. Make sure they are not too tight. Do not wear knee-high stockings since they may decrease blood flow to your legs.  Wear shoes that fit properly and have enough cushioning. To break in new shoes, wear them for just a few hours a day. This prevents you from injuring your feet. Always look in your shoes before you put them on to be sure there are no objects inside.  Do not cross your legs. This may decrease the blood flow to your feet.  If you find a minor scrape,  cut, or break in the skin on your feet, keep it and the skin around it clean and dry. These areas may be cleansed with mild soap and water. Do not cleanse the area with peroxide, alcohol, or iodine.  When you remove an adhesive bandage, be sure not to damage the skin around it.  If you have a wound, look at it several times a day to make sure it is healing.  Do not use heating pads or hot water bottles. They may burn your skin. If you have lost feeling in your feet or legs, you may not know it is happening until it is too late.  Make sure your health care provider performs a complete foot exam at least annually or more often if you have foot problems. Report any cuts, sores, or bruises to your health care provider immediately. SEEK MEDICAL CARE IF:   You have an injury that is not healing.  You have cuts or breaks in the skin.  You have an ingrown nail.  You notice redness on your legs or feet.  You feel burning or tingling in your legs or feet.  You have pain or cramps in your legs and feet.  Your legs or feet are numb.  Your feet always feel cold. SEEK IMMEDIATE MEDICAL CARE IF:   There is increasing redness,   swelling, or pain in or around a wound.  There is a red line that goes up your leg.  Pus is coming from a wound.  You develop a fever or as directed by your health care provider.  You notice a bad smell coming from an ulcer or wound. Document Released: 10/06/2000 Document Revised: 06/11/2013 Document Reviewed: 03/18/2013 [employer] Patient Information 2014 [employer].  

## 2013-11-18 NOTE — Progress Notes (Signed)
   Subjective:    [name], female    DOB: 1951/06/02, 63 y.o.   MRN: [id number]  HPI Comments: "Trim the toenails and calluses and need new shoes for this year."     Review of Systems     Objective:   Physical Exam        Assessment & Plan:

## 2013-11-18 NOTE — Progress Notes (Signed)
   Subjective:    [name], female    DOB: 1951/04/25, 63 y.o.   MRN: [id number]  HPI patient presents this time for diabetic foot nail care debridement of nails and multiple keratoses as well as obtaining new diabetic shoes to    Review of Systems no new changes or findings at this visit.     Objective:   Physical Exam Vascular status is intact with pedal pulses palpable DP pulse two over four bilateral PT plus over 4 bilateral. Refill timed 3-4 seconds all digits neurologically epicritic and proprioceptive sensations diminished on Semmes Weinstein testing to the plantar digits and forefoot and arch area bilateral. Neurologically skin color pigment normal hair growth absent nails thick brittle criptotic incurvated 1 through 5 bilateral. They're diffuse keratoses sub-5 bilateral with severe plantar flexed metatarsals also pinch callus both hallux and keratoses inferior right heel. No open wounds ulcerations no secondary infections at this time rectus foot type no signs digital contractures noted.       Assessment & Plan:  Assessment this time his diabetes with peripheral neuropathy and complications. Debridement of dystrophic friable gratified thick nails x10 as well as debridement multiple keratoses sub-5 bilateral pinch callus the hallux bilateral inferior posterior right heel bilateral on the right foot. We'll obtain appropriate authorization from Dr. Shanon Rosser for diabetic extra-depth shoes and custom insoles patient does have diabetes with complications knee replacements of shoes at this time. Nails and keratoses debridement reappointed 3 months for palliative care we'll contact when ready for shoe fitting and authorizations been obtained.  Alvan Dame DPM

## 2013-12-02 ENCOUNTER — Other Ambulatory Visit: Payer: Self-pay | Admitting: Internal Medicine

## 2013-12-03 ENCOUNTER — Telehealth: Payer: Self-pay | Admitting: *Deleted

## 2013-12-03 ENCOUNTER — Telehealth: Payer: Self-pay | Admitting: Internal Medicine

## 2013-12-03 NOTE — Telephone Encounter (Signed)
Pt asked what medication was given with her bunion surgery with Dr Ralene Cork.  I called pt to clarify what medication she was referring.  Pt states she called her primary doctor and the doctor told her the medication she would need to be pre-medicated before surgery.

## 2013-12-03 NOTE — Telephone Encounter (Signed)
Patient was premedicated prior to a colonoscopy and wanted to know what it was.  I have explained that the procedure was not performed in our office, but by Dr. Loreta Ave and I don't have any information about an oral med.  She was provided tsedation medication information for the procedure report from Dr. Loreta Ave in 2010

## 2013-12-03 NOTE — Telephone Encounter (Signed)
Patient Information:  Caller Name: Juanice  Phone: [phone]  Patient: Colbie, Sliker  Gender: Female  DOB: 1950-12-16  Age: 63 Years  PCP: Berniece Andreas [facility])  Office Follow Up:  Does the office need to follow up with this patient?: No  Instructions For The Office: N/A   Symptoms  Reason For Call & Symptoms: Pt calling regarding questions about colonoscopy. Pt states Dr. Loreta Ave rescheduled b/c pt told him she "stopped breathing twice under anesthesia". Pt states she remembers being prescribed something before surgeries that would "help that'. Advised pt that I'm not sure what that could be, and I needed more info to help. Did discuss that she has MVP and must be premedicated with antibiotic before any surgical or dental procedures. That if she had a reaction to anesthesia, that was probably unrelated to the premed/MVP, but again....speculation w/out further info. Advised pt to have Dr. Kenna Gilbert office call us and let us know what it is they need to clear pt for colonoscopy. Pt verbalized understanding.  Reviewed Health History In EMR: Yes  Reviewed Medications In EMR: Yes  Reviewed Allergies In EMR: Yes  Reviewed Surgeries / Procedures: Yes  Date of Onset of Symptoms: 12/03/2013  Guideline(s) Used:  No Protocol Available - Information Only  Disposition Per Guideline:   Home Care  Reason For Disposition Reached:   Information only question and nurse able to answer  Advice Given:  N/A  Patient Will Follow Care Advice:  YES

## 2013-12-04 ENCOUNTER — Ambulatory Visit: Payer: Self-pay | Admitting: Family Medicine

## 2013-12-04 ENCOUNTER — Other Ambulatory Visit (INDEPENDENT_AMBULATORY_CARE_PROVIDER_SITE_OTHER): Payer: Medicare Other

## 2013-12-04 DIAGNOSIS — E1165 Type 2 diabetes mellitus with hyperglycemia: Secondary | ICD-10-CM

## 2013-12-04 DIAGNOSIS — IMO0001 Reserved for inherently not codable concepts without codable children: Secondary | ICD-10-CM

## 2013-12-04 DIAGNOSIS — E785 Hyperlipidemia, unspecified: Secondary | ICD-10-CM

## 2013-12-04 LAB — BASIC METABOLIC PANEL
BUN: 12 mg/dL (ref 6–23)
CO2: 26 mEq/L (ref 19–32)
Calcium: 9.6 mg/dL (ref 8.4–10.5)
Chloride: 106 mEq/L (ref 96–112)
Creatinine, Ser: 1.3 mg/dL — ABNORMAL HIGH (ref 0.4–1.2)
GFR: 54.4 mL/min — ABNORMAL LOW (ref >60.00)
Glucose, Bld: 103 mg/dL — ABNORMAL HIGH (ref 70–99)
Potassium: 4 mEq/L (ref 3.5–5.1)
Sodium: 142 mEq/L (ref 135–145)

## 2013-12-04 LAB — LIPID PANEL
Cholesterol: 160 mg/dL (ref 0–200)
HDL: 52.5 mg/dL (ref >39.00)
LDL Cholesterol: 92 mg/dL (ref 0–99)
Total CHOL/HDL Ratio: 3
Triglycerides: 77 mg/dL (ref 0.0–149.0)
VLDL: 15.4 mg/dL (ref 0.0–40.0)

## 2013-12-04 LAB — HEMOGLOBIN A1C: Hgb A1c MFr Bld: 6.1 % (ref 4.6–6.5)

## 2013-12-05 ENCOUNTER — Telehealth: Payer: Self-pay

## 2013-12-05 NOTE — Telephone Encounter (Signed)
Relevant patient education assigned to patient using Emmi. ° °

## 2013-12-10 ENCOUNTER — Other Ambulatory Visit: Payer: Self-pay | Admitting: Gastroenterology

## 2013-12-11 ENCOUNTER — Ambulatory Visit: Payer: Medicare Other | Admitting: Internal Medicine

## 2013-12-19 ENCOUNTER — Ambulatory Visit (INDEPENDENT_AMBULATORY_CARE_PROVIDER_SITE_OTHER): Payer: Medicare Other | Admitting: Internal Medicine

## 2013-12-19 ENCOUNTER — Encounter: Payer: Self-pay | Admitting: Internal Medicine

## 2013-12-19 VITALS — BP 120/64 | HR 101 | Temp 98.7°F | Ht 63.0 in | Wt 158.0 lb

## 2013-12-19 DIAGNOSIS — E1129 Type 2 diabetes mellitus with other diabetic kidney complication: Secondary | ICD-10-CM

## 2013-12-19 DIAGNOSIS — N289 Disorder of kidney and ureter, unspecified: Secondary | ICD-10-CM

## 2013-12-19 DIAGNOSIS — IMO0001 Reserved for inherently not codable concepts without codable children: Secondary | ICD-10-CM

## 2013-12-19 DIAGNOSIS — I10 Essential (primary) hypertension: Secondary | ICD-10-CM

## 2013-12-19 DIAGNOSIS — E1165 Type 2 diabetes mellitus with hyperglycemia: Principal | ICD-10-CM

## 2013-12-19 DIAGNOSIS — E785 Hyperlipidemia, unspecified: Secondary | ICD-10-CM

## 2013-12-19 DIAGNOSIS — Z23 Encounter for immunization: Secondary | ICD-10-CM

## 2013-12-19 NOTE — Patient Instructions (Signed)
LABS are very good  Kidney function improved diabetes in excellent control . Blood pressure is also good. prevnar 13 today . Continue meds  Keep record of med list   Disc ambien with dr Betti Cruz ( can be  Habit forming and cause  Next day sedation also)   Plan preventive  Visit in 6 months or as needed
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
## 2013-12-19 NOTE — Assessment & Plan Note (Signed)
1.2 today better avoids nsaid

## 2013-12-22 ENCOUNTER — Telehealth: Payer: Self-pay | Admitting: Internal Medicine

## 2013-12-22 NOTE — Telephone Encounter (Signed)
Relevant patient education assigned to patient using Emmi. ° °

## 2013-12-26 ENCOUNTER — Ambulatory Visit: Payer: Medicare Other

## 2014-01-02 ENCOUNTER — Other Ambulatory Visit: Payer: Self-pay | Admitting: Internal Medicine

## 2014-01-02 ENCOUNTER — Telehealth: Payer: Self-pay | Admitting: *Deleted

## 2014-01-02 ENCOUNTER — Ambulatory Visit: Payer: Medicare Other

## 2014-01-02 NOTE — Telephone Encounter (Signed)
Pt presented to be measured and molded for diabetic shoes.  Pt's feet were molded in a bio-foam box and marked for existing callus on right foot for inserts.  Pt was then measured at 9.5 medium and decided on the women's Apex A540A Petals Robyn in black.  Notified pt that it will take approximately 4-6 weeks for shoes and inserts to come in that she will receive a postcard and will then need to make an appointment to come in and see Dr Ralene Cork for pick up.

## 2014-01-05 ENCOUNTER — Encounter (HOSPITAL_COMMUNITY)
Admission: RE | Disposition: A | Discharge status: Home / Self Care | Payer: Self-pay | Source: Ambulatory Visit | Attending: Gastroenterology

## 2014-01-05 ENCOUNTER — Ambulatory Visit (HOSPITAL_COMMUNITY)
Admission: RE | Admit: 2014-01-05 | Discharge: 2014-01-05 | Disposition: A | Discharge status: Home / Self Care | Payer: Medicare Other | Source: Ambulatory Visit | Attending: Gastroenterology | Admitting: Gastroenterology

## 2014-01-05 ENCOUNTER — Encounter (HOSPITAL_COMMUNITY): Payer: Self-pay

## 2014-01-05 DIAGNOSIS — E785 Hyperlipidemia, unspecified: Secondary | ICD-10-CM | POA: Insufficient documentation

## 2014-01-05 DIAGNOSIS — Z87891 Personal history of nicotine dependence: Secondary | ICD-10-CM | POA: Insufficient documentation

## 2014-01-05 DIAGNOSIS — K219 Gastro-esophageal reflux disease without esophagitis: Secondary | ICD-10-CM | POA: Insufficient documentation

## 2014-01-05 DIAGNOSIS — R198 Other specified symptoms and signs involving the digestive system and abdomen: Secondary | ICD-10-CM | POA: Insufficient documentation

## 2014-01-05 DIAGNOSIS — Z79899 Other long term (current) drug therapy: Secondary | ICD-10-CM | POA: Insufficient documentation

## 2014-01-05 DIAGNOSIS — E119 Type 2 diabetes mellitus without complications: Secondary | ICD-10-CM | POA: Insufficient documentation

## 2014-01-05 HISTORY — PX: COLONOSCOPY: SHX5424

## 2014-01-05 LAB — GLUCOSE, CAPILLARY: Glucose-Capillary: 78 mg/dL (ref 70–99)

## 2014-01-05 SURGERY — COLONOSCOPY
Anesthesia: Moderate Sedation

## 2014-01-05 MED ORDER — MIDAZOLAM HCL 10 MG/2ML IJ SOLN
INTRAMUSCULAR | Status: AC
  Filled 2014-01-05: qty 2

## 2014-01-05 MED ORDER — DIPHENHYDRAMINE HCL 50 MG/ML IJ SOLN
INTRAMUSCULAR | Status: AC
  Filled 2014-01-05: qty 1

## 2014-01-05 MED ORDER — FENTANYL CITRATE 0.05 MG/ML IJ SOLN
INTRAMUSCULAR | Status: AC
  Filled 2014-01-05: qty 2

## 2014-01-05 MED ORDER — MIDAZOLAM HCL 10 MG/2ML IJ SOLN
INTRAMUSCULAR | Status: DC | PRN
  Administered 2014-01-05: 2 mg via INTRAVENOUS
  Administered 2014-01-05: 1 mg via INTRAVENOUS
  Administered 2014-01-05: 2 mg via INTRAVENOUS

## 2014-01-05 MED ORDER — FENTANYL CITRATE 0.05 MG/ML IJ SOLN
INTRAMUSCULAR | Status: DC | PRN
  Administered 2014-01-05 (×3): 25 ug via INTRAVENOUS

## 2014-01-05 MED ORDER — SODIUM CHLORIDE 0.9 % IV SOLN
INTRAVENOUS | Status: DC
  Administered 2014-01-05: 500 mL via INTRAVENOUS

## 2014-01-05 NOTE — Discharge Instructions (Signed)
Colonoscopy, Care After °Refer to this sheet in the next few weeks. These instructions provide you with information on caring for yourself after your procedure. Your health care provider may also give you more specific instructions. Your treatment has been planned according to current medical practices, but problems sometimes occur. Call your health care provider if you have any problems or questions after your procedure. °WHAT TO EXPECT AFTER THE PROCEDURE  °After your procedure, it is typical to have the following: °· A small amount of blood in your stool. °· Moderate amounts of gas and mild abdominal cramping or bloating. °HOME CARE INSTRUCTIONS °· Do not drive, operate machinery, or sign important documents for 24 hours. °· You may shower and resume your regular physical activities, but move at a slower pace for the first 24 hours. °· Take frequent rest periods for the first 24 hours. °· Walk around or put a warm pack on your abdomen to help reduce abdominal cramping and bloating. °· Drink enough fluids to keep your urine clear or pale yellow. °· You may resume your normal diet as instructed by your health care provider. Avoid heavy or fried foods that are hard to digest. °· Avoid drinking alcohol for 24 hours or as instructed by your health care provider. °· Only take over-the-counter or prescription medicines as directed by your health care provider. °· If a tissue sample (biopsy) was taken during your procedure: °· Do not take aspirin or blood thinners for 7 days, or as instructed by your health care provider. °· Do not drink alcohol for 7 days, or as instructed by your health care provider. °· Eat soft foods for the first 24 hours. °SEEK MEDICAL CARE IF: °You have persistent spotting of blood in your stool 2 3 days after the procedure. °SEEK IMMEDIATE MEDICAL CARE IF: °· You have more than a small spotting of blood in your stool. °· You pass large blood clots in your stool. °· Your abdomen is swollen  (distended). °· You have nausea or vomiting. °· You have a fever. °· You have increasing abdominal pain that is not relieved with medicine. °Document Released: 05/23/2004 Document Revised: 07/30/2013 Document Reviewed: 06/16/2013 °ExitCare® Patient Information ©2014 [employer]. ° °

## 2014-01-05 NOTE — H&P (Signed)
Desiree [name] is an 63 y.o. female.   Chief Complaint: Change in bowel habits. HPI: Patient is here for a colonoscopy as she has had a change in bowel habits. See office notes for details.  Past Medical History  Diagnosis Date  . Hyperlipidemia   . GERD (gastroesophageal reflux disease)   . Depression   . HH (hiatus hernia)   . FH: colonic polyps   . Fractured elbow     right   . History of transfusion of packed red blood cells   . Bipolar disorder   . Osteoarthritis of more than one site   . Seasonal allergies   . History of carpal tunnel syndrome   . Neuroleptic-induced tardive dyskinesia   . Diabetes mellitus without complication   . CANDIDIASIS, ESOPHAGEAL 07/28/2009    Qualifier: Diagnosis of  By: Fabian Sharp MD, Neta Mends    Past Surgical History  Procedure Laterality Date  . Back fusion  2002  . Rt. toe bunion    . Knee surgery    . Juvara osteomy    . Carpal tunnel release  yates    left  . External ear surgery Left   . Finger surgery Left    Family History  Problem Relation Age of Onset  . Hypertension Father   . Throat cancer Father   . Diabetes Father   . Kidney disease Brother    Social History:  reports that she quit smoking about 11 years ago. Her smoking use included Cigarettes. She has a 40 pack-year smoking history. She has never used smokeless tobacco. She reports that she does not drink alcohol or use illicit drugs.  Allergies:  Allergies  Allergen Reactions  . Codeine Hives, Itching and Rash  . Hydrocodone-Acetaminophen Hives, Itching and Rash  . Penicillins Hives, Itching and Rash    Slight reaction on amoxicillin    Medications Prior to Admission  Medication Sig Dispense Refill  . CRESTOR 10 MG tablet TAKE 1 TABLET BY MOUTH EVERY DAY  30 tablet  5  . esomeprazole (NEXIUM) 40 MG capsule Take 40 mg by mouth daily before breakfast.      . glucose blood test strip Test once daily  100 each  5  . mirtazapine (REMERON) 15 MG tablet Take  15 mg by mouth at bedtime.      . mometasone (NASONEX) 50 MCG/ACT nasal spray Place 2 sprays into the nose daily.  17 g  12  . prazosin (MINIPRESS) 1 MG capsule       . traMADol (ULTRAM) 50 MG tablet Take 50 mg by mouth 2 (two) times daily as needed for pain.       Marland Kitchen zolpidem (AMBIEN) 5 MG tablet Take 1 tablet (5 mg total) by mouth at bedtime as needed for sleep.  30 tablet  0  . Multiple Vitamins-Minerals (CENTRUM SILVER PO) Take 1 tablet by mouth daily.       Marland Kitchen neomycin-bacitracin-polymyxin (NEOSPORIN) ointment Apply 1 application topically every 12 (twelve) hours.         No results found for this or any previous visit (from the past 48 hour(s)). No results found.  Review of Systems  Constitutional: Negative.   HENT: Negative.   Eyes: Negative.   Respiratory: Negative.   Cardiovascular: Negative.   Gastrointestinal: Positive for heartburn, nausea and diarrhea. Negative for vomiting, constipation, blood in stool and melena.  Genitourinary: Negative.   Musculoskeletal: Positive for joint pain. Negative for myalgias and neck pain.  Skin: Negative.   Neurological: Negative.   Endo/Heme/Allergies: Negative.   Psychiatric/Behavioral: Negative.    There were no vitals taken for this visit. Physical Exam  Constitutional: She is oriented to person, place, and time. She appears well-developed and well-nourished.  Morbidly obese  HENT:  Head: Normocephalic and atraumatic.  Eyes: Conjunctivae and EOM are normal. Pupils are equal, round, and reactive to light.  Neck: Normal range of motion. Neck supple.  Cardiovascular: Normal rate and regular rhythm.   Respiratory: Effort normal and breath sounds normal.  GI: Soft. Bowel sounds are normal.  Musculoskeletal: Normal range of motion.  Neurological: She is alert and oriented to person, place, and time.  Skin: Skin is warm and dry.  Psychiatric: She has a normal mood and affect. Her behavior is normal. Judgment and thought content normal.     Assessment/Plan Change in bowel habits: proceed with a colonoscopy at this time.  Desiree [name] 01/05/2014, 2:25 PM

## 2014-01-05 NOTE — Op Note (Signed)
[facility] [address] [place], [address]   OPERATIVE PROCEDURE REPORT  PATIENT: Amana, Bouska  MR#: [id number] BIRTHDATE: Dec 05, 1950 GENDER: Female ENDOSCOPIST: Lorenza Burton, MD ASSISTANT:   Nilsa Nutting, Endoscopy technician Harold Barban, RN PROCEDURE DATE: 01/05/2014 PRE-PROCEDURE PREPARATION: The patient was prepped with a gallon of Golytely the night prior to the procedure.  The patient was fasted for 8 hours prior to the procedure.  PRE-PROCEDURE PHYSICAL: Patient has stable vital signs.  Neck is supple.  There is no JVD, thyromegaly or LAD.  Chest clear to auscultation.  S1 and S2 regular.  Abdomen soft, non-distended, non-tender with NABS. PROCEDURE:     Colonoscopy, diagnostic ASA CLASS:     Class IV INDICATIONS:     1.  Change in bowel habits 2. Colorectal cancer screening. MEDICATIONS:     Fentanyl 75 mcg & Versed 6 mg IV.  DESCRIPTION OF PROCEDURE: After the risks, benefits, and alternatives of the procedure were thoroughly explained [including a 10% missed rate of cancer and polyps], informed consent was obtained.  Digital rectal exam was performed.  The Pentax Colonoscope [id number]  was introduced through the anus  and advanced to the terminal ileum which was intubated for a short distance , limited by No adverse events experienced.   The quality of the prep was good. . Multiple washes were done. Small lesions could be missed. The instrument was then slowly withdrawn as the colon was fully examined.     COLON FINDINGS: A normal appearing cecum, ileocecal valve, and appendiceal orifice were identified.  The ascending, hepatic flexure, transverse, splenic flexure, descending, sigmoid colon and rectum appeared unremarkable.  No polyps or cancers were seen. The mucosa appeared normal in the terminal ileum.  The entire colonic mucosa appeared healthy with a normal vascular pattern.  No masses, polyps, diverticula or AVMs were noted. The  appendiceal orifice and the ICV were identified and photographed.  The terminal ileum appeared normal.  Retroflexed views revealed no abnormalities. The patient tolerated the procedure without immediate complications.  The scope was then withdrawn from the patient and the procedure terminated.  TIME TO CECUM:   5 minutes 00 seconds WITHDRAW TIME:  6 minutes 00 seconds  IMPRESSION:     1.  Normal colonoscopy upto the cecum. 2.  Normal mucosa in the terminal ileum   RECOMMENDATIONS:     1.  Continue surveillance 2.  Continue current medications 3.  High fiber diet with liberal fluid intake. 4.  OP follow-up is advised on a PRN basis.   REPEAT EXAM:      In 10 years for a repeat colonoscopy.  If the patient has any abnormal GI symptoms in the interim, she have been advised to contact the office as soon as possible for further recommendations.   CPT CODES:     [id number], Screening Colonoscopy   DIAGNOSIS CODES:     787.99, V76.51   REFERRED TI:RWERX Mabe, M.D.  Teodora Medici, M.D.  Madelin Headings, M.D.  eSigned:  Dr. Lorenza Burton, MD 01/05/2014 3:54 PM   PATIENT NAME:  Suleyma, Wafer MR#: [id number]

## 2014-01-06 ENCOUNTER — Encounter (HOSPITAL_COMMUNITY): Payer: Self-pay | Admitting: Gastroenterology

## 2014-01-30 ENCOUNTER — Telehealth: Payer: Self-pay | Admitting: Internal Medicine

## 2014-01-30 NOTE — Telephone Encounter (Signed)
Patient Information:  Caller Name: [name]  Phone: [phone]  Patient: Desiree, [name]  Gender: Female  DOB: [birth year]  Age: 63 Years  PCP: Berniece Andreas (Family Practice)  Office Follow Up:  Does the office need to follow up with this patient?: No  Instructions For The Office: N/A   Symptoms  Reason For Call & Symptoms: Pt states she is having cold symptoms with cough.  Reviewed Health History In EMR: Yes  Reviewed Medications In EMR: Yes  Reviewed Allergies In EMR: Yes  Reviewed Surgeries / Procedures: Yes  Date of Onset of Symptoms: 01/27/2014  Any Fever: Yes  Fever Taken: Oral  Fever Time Of Reading: 10:00:00  Fever Last Reading: 100.00  Guideline(s) Used:  Colds  Disposition Per Guideline:   Home Care  Reason For Disposition Reached:   Colds with no complications  Advice Given:  Reassurance  It sounds like an uncomplicated cold that we can treat at home.  Colds are very common and may make you feel uncomfortable.  Colds are caused by viruses, and no medicine or "shot" will cure an uncomplicated cold.  Here is some care advice that should help.  For a Runny Nose With Profuse Discharge:   Nasal mucus and discharge helps to wash viruses and bacteria out of the nose and sinuses.  Blowing the nose is all that is needed.  For a Stuffy Nose - Use Nasal Washes:  Introduction: Saline (salt water) nasal irrigation (nasal wash) is an effective and simple home remedy for treating stuffy nose and sinus congestion. The nose can be irrigated by pouring, spraying, or squirting salt water into the nose and then letting it run back out.  How it Helps: The salt water rinses out excess mucus, washes out any irritants (dust, allergens) that might be present, and moistens the nasal cavity.  Methods: There are several ways to perform nasal irrigation. You can use a saline nasal spray bottle (available over-the-counter), a rubber ear syringe, a medical syringe without the needle, or a Neti  Pot.  Treatment for Associated Symptoms of Colds:  For muscle aches, headaches, or moderate fever (more than 101 F or 38.9 C): Take acetaminophen every 4 hours.  Sore throat: Try throat lozenges, hard candy, or warm chicken broth.  Cough: Use cough drops.  Hydrate: Drink adequate liquids.  Humidifier:  If the air in your home is dry, use a cool-mist humidifier  Call Back If:  Difficulty breathing occurs  Fever lasts more than 3 days  Nasal discharge lasts more than 10 days  Cough lasts more than 3 weeks  You become worse  Patient Will Follow Care Advice:  YES

## 2014-01-30 NOTE — Telephone Encounter (Signed)
Noted  

## 2014-02-17 ENCOUNTER — Ambulatory Visit: Payer: Medicare Other

## 2014-02-20 ENCOUNTER — Telehealth: Payer: Self-pay | Admitting: Internal Medicine

## 2014-02-20 NOTE — Telephone Encounter (Signed)
Pt is needing new rx accu-check diabetic strips sent to [facility] on [address]. Pt states she can get the strips free using [facility] of [facility]. Pt is not switching pharmacy it's only for the strips.

## 2014-02-20 NOTE — Telephone Encounter (Signed)
Need further information.  Accu-check has different test strips.  Please ask what kind.  Thanks!

## 2014-02-24 MED ORDER — GLUCOSE BLOOD VI STRP: ORAL_STRIP | Status: DC

## 2014-02-24 NOTE — Telephone Encounter (Signed)
Pt stated she needs aviva plus test strips

## 2014-02-24 NOTE — Telephone Encounter (Signed)
Sent to the pharmacy by e-scribe. 

## 2014-03-06 ENCOUNTER — Ambulatory Visit (INDEPENDENT_AMBULATORY_CARE_PROVIDER_SITE_OTHER): Payer: Medicare Other

## 2014-03-06 VITALS — BP 114/68 | HR 57 | Resp 16

## 2014-03-06 DIAGNOSIS — M2040 Other hammer toe(s) (acquired), unspecified foot: Secondary | ICD-10-CM

## 2014-03-06 DIAGNOSIS — E1142 Type 2 diabetes mellitus with diabetic polyneuropathy: Secondary | ICD-10-CM

## 2014-03-06 DIAGNOSIS — M79609 Pain in unspecified limb: Secondary | ICD-10-CM

## 2014-03-06 DIAGNOSIS — Q828 Other specified congenital malformations of skin: Secondary | ICD-10-CM

## 2014-03-06 DIAGNOSIS — E1149 Type 2 diabetes mellitus with other diabetic neurological complication: Secondary | ICD-10-CM

## 2014-03-06 NOTE — Progress Notes (Signed)
   Subjective:    [name], female    DOB: [birth year], 63 y.o.   MRN: [id number]  HPI Comments: Picking up diabetic shoes   Diabetes      Review of Systems no new systemic changes or findings noted     Objective:   Physical Exam Neurovascular status intact pedal pulses palpable there is decreased sensation on Semmes Weinstein testing there patient is having some cramping in her toes maybe at this case or dehydration recommend well hydrated and osseous potassium your diet monitor for any recurrence at this time nails have been recently trim by the patient suggested 2 through 5 for palliative nail care 1 pair shoes and 3 pairs of dual density Plastizote inlays are dispensed the presence of diabetes as well as digital contractures multiple keratoses no secondary infections no open wounds or ulcerations noted current time.       Assessment & Plan:  Assessment diabetes with complications pressure peripheral neuropathy hammertoe deformities and digital contractures and multiple 4 keratoses shoes fit and contour well dispensed oral and written instructions for use in break in recheck in 2-3 months for followup and palliative care in the future as needed next  Alvan Dame DPM

## 2014-03-06 NOTE — Patient Instructions (Signed)
Diabetes and Foot Care Diabetes may cause you to have problems because of poor blood supply (circulation) to your feet and legs. This may cause the skin on your feet to become thinner, break easier, and heal more slowly. Your skin may become dry, and the skin may peel and crack. You may also have nerve damage in your legs and feet causing decreased feeling in them. You may not notice minor injuries to your feet that could lead to infections or more serious problems. Taking care of your feet is one of the most important things you can do for yourself.  HOME CARE INSTRUCTIONS  Wear shoes at all times, even in the house. Do not go barefoot. Bare feet are easily injured.  Check your feet daily for blisters, cuts, and redness. If you cannot see the bottom of your feet, use a mirror or ask someone for help.  Wash your feet with warm water (do not use hot water) and mild soap. Then pat your feet and the areas between your toes until they are completely dry. Do not soak your feet as this can dry your skin.  Apply a moisturizing lotion or petroleum jelly (that does not contain alcohol and is unscented) to the skin on your feet and to dry, brittle toenails. Do not apply lotion between your toes.  Trim your toenails straight across. Do not dig under them or around the cuticle. File the edges of your nails with an emery board or nail file.  Do not cut corns or calluses or try to remove them with medicine.  Wear clean socks or stockings every day. Make sure they are not too tight. Do not wear knee-high stockings since they may decrease blood flow to your legs.  Wear shoes that fit properly and have enough cushioning. To break in new shoes, wear them for just a few hours a day. This prevents you from injuring your feet. Always look in your shoes before you put them on to be sure there are no objects inside.  Do not cross your legs. This may decrease the blood flow to your feet.  If you find a minor scrape,  cut, or break in the skin on your feet, keep it and the skin around it clean and dry. These areas may be cleansed with mild soap and water. Do not cleanse the area with peroxide, alcohol, or iodine.  When you remove an adhesive bandage, be sure not to damage the skin around it.  If you have a wound, look at it several times a day to make sure it is healing.  Do not use heating pads or hot water bottles. They may burn your skin. If you have lost feeling in your feet or legs, you may not know it is happening until it is too late.  Make sure your health care provider performs a complete foot exam at least annually or more often if you have foot problems. Report any cuts, sores, or bruises to your health care provider immediately. SEEK MEDICAL CARE IF:   You have an injury that is not healing.  You have cuts or breaks in the skin.  You have an ingrown nail.  You notice redness on your legs or feet.  You feel burning or tingling in your legs or feet.  You have pain or cramps in your legs and feet.  Your legs or feet are numb.  Your feet always feel cold. SEEK IMMEDIATE MEDICAL CARE IF:   There is increasing redness,   swelling, or pain in or around a wound.  There is a red line that goes up your leg.  Pus is coming from a wound.  You develop a fever or as directed by your health care provider.  You notice a bad smell coming from an ulcer or wound. Document Released: 10/06/2000 Document Revised: 06/11/2013 Document Reviewed: 03/18/2013 [employer] Patient Information 2014 [employer].  

## 2014-03-09 ENCOUNTER — Encounter: Payer: Self-pay | Admitting: *Deleted

## 2014-03-09 ENCOUNTER — Telehealth: Payer: Self-pay | Admitting: *Deleted

## 2014-03-09 NOTE — Telephone Encounter (Signed)
Pt states her medication list from her last visit is encorrect, she is not taking Zolpidem, and the Nexium 40 mg has been changed to Pantoprozole 20 mg.  I changed these medication through Abstract.

## 2014-04-09 ENCOUNTER — Telehealth: Payer: Self-pay | Admitting: Internal Medicine

## 2014-04-09 NOTE — Telephone Encounter (Signed)
[facility] - [place], [place] - [address] is requesting 90 day re-fill on CRESTOR 10 MG tablet

## 2014-04-13 MED ORDER — ROSUVASTATIN CALCIUM 10 MG PO TABS: ORAL_TABLET | ORAL | Status: DC

## 2014-04-13 NOTE — Telephone Encounter (Signed)
Medication sent to the pharmacy by e-scribe.  Patient would like to request a tier change.  Is this possible?

## 2014-04-13 NOTE — Telephone Encounter (Signed)
I called [facility] and was advised that the medication is a Tier 3 and Tier Exceptions are only allowed on Tier 2 & 4 medications. Pt is aware and verbalized understanding.

## 2014-05-01 ENCOUNTER — Telehealth: Payer: Self-pay | Admitting: Internal Medicine

## 2014-05-01 NOTE — Telephone Encounter (Signed)
[facility] - [place], [place] - [address] is requesting 90 day re-fill on mometasone (NASONEX) 50 MCG/ACT nasal spray

## 2014-05-04 MED ORDER — MOMETASONE FUROATE 50 MCG/ACT NA SUSP: 2.0000 | Freq: Every day | NASAL | Status: DC

## 2014-05-04 NOTE — Telephone Encounter (Signed)
You have not prescribed.  Was given to the pt in the ED, 03/2013 and filled for 1 year.  Please advise.  Thanks!

## 2014-05-04 NOTE — Telephone Encounter (Signed)
Sent to the pharmacy by e-scribe. 

## 2014-05-04 NOTE — Telephone Encounter (Signed)
Ok to rx for 6 months refills

## 2014-06-04 ENCOUNTER — Telehealth: Payer: Self-pay | Admitting: Internal Medicine

## 2014-06-04 ENCOUNTER — Encounter (HOSPITAL_COMMUNITY): Payer: Self-pay | Admitting: Emergency Medicine

## 2014-06-04 ENCOUNTER — Emergency Department (INDEPENDENT_AMBULATORY_CARE_PROVIDER_SITE_OTHER)
Admission: EM | Admit: 2014-06-04 | Discharge: 2014-06-04 | Disposition: A | Discharge status: Home / Self Care | Payer: Medicare Other | Source: Home / Self Care | Attending: Family Medicine | Admitting: Family Medicine

## 2014-06-04 DIAGNOSIS — R197 Diarrhea, unspecified: Secondary | ICD-10-CM

## 2014-06-04 LAB — CBC WITH DIFFERENTIAL/PLATELET
Basophils Absolute: 0.1 10*3/uL (ref 0.0–0.1)
Basophils Relative: 1 % (ref 0–1)
Eosinophils Absolute: 0.2 10*3/uL (ref 0.0–0.7)
Eosinophils Relative: 3 % (ref 0–5)
HCT: 38.4 % (ref 36.0–46.0)
Hemoglobin: 12.5 g/dL (ref 12.0–15.0)
Lymphocytes Relative: 51 % — ABNORMAL HIGH (ref 12–46)
Lymphs Abs: 4 10*3/uL (ref 0.7–4.0)
MCH: 29 pg (ref 26.0–34.0)
MCHC: 32.6 g/dL (ref 30.0–36.0)
MCV: 89.1 fL (ref 78.0–100.0)
Monocytes Absolute: 0.5 10*3/uL (ref 0.1–1.0)
Monocytes Relative: 7 % (ref 3–12)
Neutro Abs: 2.9 10*3/uL (ref 1.7–7.7)
Neutrophils Relative %: 38 % — ABNORMAL LOW (ref 43–77)
Platelets: 288 10*3/uL (ref 150–400)
RBC: 4.31 MIL/uL (ref 3.87–5.11)
RDW: 13.9 % (ref 11.5–15.5)
WBC: 7.6 10*3/uL (ref 4.0–10.5)

## 2014-06-04 LAB — BASIC METABOLIC PANEL
Anion gap: 13 (ref 5–15)
BUN: 10 mg/dL (ref 6–23)
CO2: 26 mEq/L (ref 19–32)
Calcium: 9 mg/dL (ref 8.4–10.5)
Chloride: 106 mEq/L (ref 96–112)
Creatinine, Ser: 1.28 mg/dL — ABNORMAL HIGH (ref 0.50–1.10)
GFR calc Af Amer: 51 mL/min — ABNORMAL LOW (ref >90)
GFR calc non Af Amer: 44 mL/min — ABNORMAL LOW (ref >90)
Glucose, Bld: 86 mg/dL (ref 70–99)
Potassium: 3.6 mEq/L — ABNORMAL LOW (ref 3.7–5.3)
Sodium: 145 mEq/L (ref 137–147)

## 2014-06-04 MED ORDER — LOPERAMIDE HCL 2 MG PO CAPS: 4.0000 mg | ORAL_CAPSULE | Freq: Four times a day (QID) | ORAL | Status: DC | PRN

## 2014-06-04 NOTE — ED Notes (Signed)
C/o diarrhea this morning and yesterday morning only States she feels nausea Has little appetite

## 2014-06-04 NOTE — ED Provider Notes (Signed)
CSN: [id number]     Arrival date & time 06/04/14  1521 History   First MD Initiated Contact with Patient 06/04/14 1538     Chief Complaint  Patient presents with  . Diarrhea   (Consider location/radiation/quality/duration/timing/severity/associated sxs/prior Treatment) Patient is a 63 y.o. female presenting with diarrhea. The history is provided by the patient.  Diarrhea Quality:  Watery Severity:  Moderate Onset quality:  Sudden Number of episodes:  2 Duration:  2 days Progression:  Unchanged Ineffective treatments:  None tried Associated symptoms: no abdominal pain, no chills, no fever and no vomiting   Associated symptoms comment:  Pt feels like she has had wt loss over past 2 weeks Risk factors: no sick contacts and no suspicious food intake     Past Medical History  Diagnosis Date  . Hyperlipidemia   . GERD (gastroesophageal reflux disease)   . Depression   . HH (hiatus hernia)   . FH: colonic polyps   . Fractured elbow     right   . History of transfusion of packed red blood cells   . Bipolar disorder   . Osteoarthritis of more than one site   . Seasonal allergies   . History of carpal tunnel syndrome   . Neuroleptic-induced tardive dyskinesia   . Diabetes mellitus without complication   . CANDIDIASIS, ESOPHAGEAL 07/28/2009    Qualifier: Diagnosis of  By: Fabian Sharp MD, Neta Mends    Past Surgical History  Procedure Laterality Date  . Back fusion  2002  . Rt. toe bunion    . Knee surgery    . Juvara osteomy    . Carpal tunnel release  yates    left  . External ear surgery Left   . Finger surgery Left   . Colonoscopy N/A 01/05/2014    Procedure: COLONOSCOPY;  Surgeon: Charna Elizabeth, MD;  Location: [facility];  Service: Endoscopy;  Laterality: N/A;   Family History  Problem Relation Age of Onset  . Hypertension Father   . Throat cancer Father   . Diabetes Father   . Kidney disease Brother    History  Substance Use Topics  . Smoking status: Former Smoker --  2.00 packs/day for 20 years    Types: Cigarettes    Quit date: 10/23/2002  . Smokeless tobacco: Never Used  . Alcohol Use: No   OB History   Grav Para Term Preterm Abortions TAB SAB Ect Mult Living                 Review of Systems  Constitutional: Negative for fever and chills.  Gastrointestinal: Positive for nausea and diarrhea. Negative for vomiting, abdominal pain, constipation, blood in stool and rectal pain.  Genitourinary: Negative for dysuria and frequency.    Allergies  Codeine; Hydrocodone-acetaminophen; and Penicillins  Home Medications   Prior to Admission medications   Medication Sig Start Date End Date Taking? Authorizing Provider  glucose blood (ACCU-CHEK AVIVA PLUS) test strip Use to check blood sugar 1-2 times daily 02/24/14   Madelin Headings, MD  loperamide (IMODIUM) 2 MG capsule Take 2 capsules (4 mg total) by mouth 4 (four) times daily as needed for diarrhea or loose stools. 06/04/14   Linna Hoff, MD  mirtazapine (REMERON) 15 MG tablet Take 15 mg by mouth at bedtime.    Historical Provider, MD  mometasone (NASONEX) 50 MCG/ACT nasal spray Place 2 sprays into the nose daily. 05/04/14   Madelin Headings, MD  Multiple Vitamins-Minerals (CENTRUM SILVER PO) Take  1 tablet by mouth daily.     Historical Provider, MD  neomycin-bacitracin-polymyxin (NEOSPORIN) ointment Apply 1 application topically every 12 (twelve) hours.     Historical Provider, MD  pantoprazole (PROTONIX) 20 MG tablet Take 20 mg by mouth daily.    Historical Provider, MD  prazosin (MINIPRESS) 1 MG capsule  10/08/13   Historical Provider, MD  rosuvastatin (CRESTOR) 10 MG tablet TAKE 1 TABLET BY MOUTH EVERY DAY 04/13/14   Madelin Headings, MD  traMADol (ULTRAM) 50 MG tablet Take 50 mg by mouth 2 (two) times daily as needed for pain.     Historical Provider, MD   BP 130/67  Pulse 62  Temp(Src) 98.4 F (36.9 C) (Oral)  Resp 18  SpO2 100% Physical Exam  Nursing note and vitals reviewed. Constitutional: She  is oriented to person, place, and time. She appears well-developed and well-nourished.  Neck: Normal range of motion. Neck supple. No thyromegaly present.  Cardiovascular: Normal heart sounds.   Pulmonary/Chest: Effort normal and breath sounds normal.  Abdominal: Soft. Bowel sounds are normal. She exhibits no distension and no mass. There is no tenderness. There is no rebound and no guarding.  Lymphadenopathy:    She has no cervical adenopathy.  Neurological: She is alert and oriented to person, place, and time.  Skin: Skin is warm and dry.    ED Course  Procedures (including critical care time) Labs Review Labs Reviewed  CBC WITH DIFFERENTIAL - Abnormal; Notable for the following:    Neutrophils Relative % 38 (*)    Lymphocytes Relative 51 (*)    All other components within normal limits  BASIC METABOLIC PANEL - Abnormal; Notable for the following:    Potassium 3.6 (*)    Creatinine, Ser 1.28 (*)    GFR calc non Af Amer 44 (*)    GFR calc Af Amer 51 (*)    All other components within normal limits   Cbc wnl. Imaging Review No results found.   MDM   1. Diarrhea in adult patient       Linna Hoff, MD 06/04/14 [phone]

## 2014-06-04 NOTE — Telephone Encounter (Signed)
Patient Information:  Caller Name: [place]  Phone: [phone]  Patient: Desiree [name], Desiree [name]  Gender: Female  DOB: [birth year]  Age: 63 Years  PCP: Berniece Andreas [facility])  Office Follow Up:  Does the office need to follow up with this patient?: Yes  Instructions For The Office: see notes? work-in today or go to UC/ER? please call pt back asap  RN Note:  Advised no appts available today so will send message for provider review and someone will call her back shortly with MD instructions. While waiting for office call back she is instructed to rest with feet up and sip fluids. Agreed to plan. (Pt also mentions as a side note that she wants to discontinue use of her [facility] for meds and get all of her regular meds/refills at her local [facility] [address]).  Symptoms  Reason For Call & Symptoms: Nausea (no vomiting) onset 8/9 with diarrhea onset 8/11 with moderate diarrhea since then. States she feels like she has been losing weight unintentionally for 1-2 wks (scale is broken but states clothes are 'hanging' on her). Has had episodes of sweating and just checked temp which was 99 oral. Decreased appetite and states she has not been drinking as much fluid with her diarrhea as she should. Has voided today but darker yellow than normal. Reports brief intermittent lightheadedness episodes.  Reviewed Health History In EMR: Yes  Reviewed Medications In EMR: Yes  Reviewed Allergies In EMR: Yes  Reviewed Surgeries / Procedures: Yes  Date of Onset of Symptoms: 06/02/2014  Guideline(s) Used:  Diarrhea  Disposition Per Guideline:   Go to Office Now  Reason For Disposition Reached:   Age > 60 years and has had > 6 diarrhea stools in past 24 hours  Advice Given:  Fluids:  Drink more fluids, at least 8-10 glasses (8 oz or 240 ml) daily.  For example: sports drinks, diluted fruit juices, soft drinks.  Avoid caffeinated beverages (Reason: caffeine is mildly dehydrating).  Call Back  If:  You become worse.  Patient Will Follow Care Advice:  YES

## 2014-06-04 NOTE — Discharge Instructions (Signed)
Use medicine and follow diet as needed, see your doctor next week if problem continues.

## 2014-06-04 NOTE — Telephone Encounter (Signed)
I called the pt and advised her I spoke with Dr Clent Ridges regarding her symptoms and he recommended she go to an urgent care as we do not have any openings today as she should be seen today because she could become dehydrated.  She asked what urgent care was close to [facility] and I advised her to try the [facility] on [facility] and she agreed.

## 2014-06-05 ENCOUNTER — Telehealth: Payer: Self-pay | Admitting: Internal Medicine

## 2014-06-05 MED ORDER — ROSUVASTATIN CALCIUM 10 MG PO TABS: ORAL_TABLET | ORAL | Status: DC

## 2014-06-05 NOTE — Telephone Encounter (Signed)
Sent by e-scribe.  Pt has upcoming appt on 06/18/14

## 2014-06-05 NOTE — Telephone Encounter (Signed)
[facility] - [place], [place] - [address] is requesting 90 day re-fill on rosuvastatin (CRESTOR) 10 MG tablet

## 2014-06-11 ENCOUNTER — Other Ambulatory Visit: Payer: Medicare Other

## 2014-06-16 ENCOUNTER — Ambulatory Visit: Payer: Medicare Other

## 2014-06-18 ENCOUNTER — Ambulatory Visit: Payer: Medicare Other | Admitting: Internal Medicine

## 2014-06-26 ENCOUNTER — Other Ambulatory Visit (INDEPENDENT_AMBULATORY_CARE_PROVIDER_SITE_OTHER): Payer: Commercial Managed Care - HMO

## 2014-06-26 DIAGNOSIS — E1129 Type 2 diabetes mellitus with other diabetic kidney complication: Secondary | ICD-10-CM

## 2014-06-26 LAB — BASIC METABOLIC PANEL
BUN: 14 mg/dL (ref 6–23)
CO2: 27 mEq/L (ref 19–32)
Calcium: 9.5 mg/dL (ref 8.4–10.5)
Chloride: 106 mEq/L (ref 96–112)
Creatinine, Ser: 1.4 mg/dL — ABNORMAL HIGH (ref 0.4–1.2)
GFR: 48.97 mL/min — ABNORMAL LOW (ref >60.00)
Glucose, Bld: 95 mg/dL (ref 70–99)
Potassium: 3.9 mEq/L (ref 3.5–5.1)
Sodium: 140 mEq/L (ref 135–145)

## 2014-06-26 LAB — HEMOGLOBIN A1C: Hgb A1c MFr Bld: 6.2 % (ref 4.6–6.5)

## 2014-07-03 ENCOUNTER — Ambulatory Visit (INDEPENDENT_AMBULATORY_CARE_PROVIDER_SITE_OTHER): Payer: Commercial Managed Care - HMO | Admitting: Internal Medicine

## 2014-07-03 ENCOUNTER — Encounter: Payer: Self-pay | Admitting: Internal Medicine

## 2014-07-03 VITALS — BP 130/74 | Temp 98.8°F | Ht 63.0 in | Wt 159.0 lb

## 2014-07-03 DIAGNOSIS — R252 Cramp and spasm: Secondary | ICD-10-CM | POA: Insufficient documentation

## 2014-07-03 DIAGNOSIS — R197 Diarrhea, unspecified: Secondary | ICD-10-CM

## 2014-07-03 DIAGNOSIS — I10 Essential (primary) hypertension: Secondary | ICD-10-CM

## 2014-07-03 DIAGNOSIS — M1990 Unspecified osteoarthritis, unspecified site: Secondary | ICD-10-CM

## 2014-07-03 DIAGNOSIS — Z23 Encounter for immunization: Secondary | ICD-10-CM

## 2014-07-03 DIAGNOSIS — E1129 Type 2 diabetes mellitus with other diabetic kidney complication: Secondary | ICD-10-CM

## 2014-07-03 DIAGNOSIS — N289 Disorder of kidney and ureter, unspecified: Secondary | ICD-10-CM

## 2014-07-03 MED ORDER — LOPERAMIDE HCL 2 MG PO CAPS: 4.0000 mg | ORAL_CAPSULE | Freq: Four times a day (QID) | ORAL | Status: DC | PRN
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
## 2014-07-03 NOTE — Patient Instructions (Signed)
Diarrhea may well have been a virus. imodium can cause constipation  But can use if diarrhea recurrs . follow up if recurrent problem .  Blood pressure is in range today and kidneys seem stable,  Plan preventive visit with hgba1c labs at that visit or as needed .

## 2014-07-08 ENCOUNTER — Telehealth: Payer: Self-pay | Admitting: Internal Medicine

## 2014-07-08 NOTE — Telephone Encounter (Signed)
dont have a medicine that i think would work or be safe  in her situation.  Should improve over the next 3-4 weeks and if  If feeling losing weight without a good reason then plan   or we may get gi involved  .ROV

## 2014-07-08 NOTE — Telephone Encounter (Signed)
Pt called to ask if Dr Fabian Sharp can rx her something to increase her appetite. She said she forgot to mention this when she was in on 07/03/14    Pharmacy; [facility] [address]

## 2014-07-09 NOTE — Telephone Encounter (Signed)
Spoke to the pt.  Informed her that if not improving in the next 3-4 weeks than she may need a follow up or may need to get GI involved.  Instructed to call back if not improving or getting worse.  Also informed her that [facility] does not recommend starting any medications as they may not be safe in her situation.

## 2014-07-27 ENCOUNTER — Telehealth: Payer: Self-pay | Admitting: Internal Medicine

## 2014-07-27 MED ORDER — ROSUVASTATIN CALCIUM 10 MG PO TABS: ORAL_TABLET | ORAL | Status: DC

## 2014-07-27 NOTE — Telephone Encounter (Signed)
Sent to the pharmacy by e-scribe. 

## 2014-07-27 NOTE — Telephone Encounter (Signed)
Pt request refill rosuvastatin (CRESTOR) 10 MG tablet sent to local [facility] [place]  30 day w/ refillspt has 7 tabs left and no refiil

## 2014-08-16 ENCOUNTER — Encounter (HOSPITAL_COMMUNITY): Payer: Self-pay | Admitting: Emergency Medicine

## 2014-08-16 ENCOUNTER — Emergency Department (HOSPITAL_COMMUNITY)
Admission: EM | Admit: 2014-08-16 | Discharge: 2014-08-18 | Disposition: A | Discharge status: Home / Self Care | Payer: Commercial Managed Care - HMO | Attending: Emergency Medicine | Admitting: Emergency Medicine

## 2014-08-16 ENCOUNTER — Emergency Department (HOSPITAL_COMMUNITY): Payer: Commercial Managed Care - HMO

## 2014-08-16 DIAGNOSIS — R4182 Altered mental status, unspecified: Secondary | ICD-10-CM | POA: Diagnosis present

## 2014-08-16 DIAGNOSIS — K219 Gastro-esophageal reflux disease without esophagitis: Secondary | ICD-10-CM | POA: Insufficient documentation

## 2014-08-16 DIAGNOSIS — R443 Hallucinations, unspecified: Secondary | ICD-10-CM | POA: Diagnosis not present

## 2014-08-16 DIAGNOSIS — Z880 Allergy status to penicillin: Secondary | ICD-10-CM | POA: Insufficient documentation

## 2014-08-16 DIAGNOSIS — Z87891 Personal history of nicotine dependence: Secondary | ICD-10-CM | POA: Insufficient documentation

## 2014-08-16 DIAGNOSIS — Z7951 Long term (current) use of inhaled steroids: Secondary | ICD-10-CM | POA: Diagnosis not present

## 2014-08-16 DIAGNOSIS — Z8739 Personal history of other diseases of the musculoskeletal system and connective tissue: Secondary | ICD-10-CM | POA: Insufficient documentation

## 2014-08-16 DIAGNOSIS — Z792 Long term (current) use of antibiotics: Secondary | ICD-10-CM | POA: Insufficient documentation

## 2014-08-16 DIAGNOSIS — Z8669 Personal history of other diseases of the nervous system and sense organs: Secondary | ICD-10-CM | POA: Diagnosis not present

## 2014-08-16 DIAGNOSIS — E119 Type 2 diabetes mellitus without complications: Secondary | ICD-10-CM | POA: Insufficient documentation

## 2014-08-16 DIAGNOSIS — Z79899 Other long term (current) drug therapy: Secondary | ICD-10-CM | POA: Insufficient documentation

## 2014-08-16 DIAGNOSIS — R0602 Shortness of breath: Secondary | ICD-10-CM | POA: Insufficient documentation

## 2014-08-16 DIAGNOSIS — E785 Hyperlipidemia, unspecified: Secondary | ICD-10-CM | POA: Diagnosis not present

## 2014-08-16 DIAGNOSIS — Z86010 Personal history of colonic polyps: Secondary | ICD-10-CM | POA: Insufficient documentation

## 2014-08-16 DIAGNOSIS — F3110 Bipolar disorder, current episode manic without psychotic features, unspecified: Secondary | ICD-10-CM | POA: Diagnosis present

## 2014-08-16 DIAGNOSIS — R079 Chest pain, unspecified: Secondary | ICD-10-CM | POA: Diagnosis not present

## 2014-08-16 DIAGNOSIS — Z8619 Personal history of other infectious and parasitic diseases: Secondary | ICD-10-CM | POA: Diagnosis not present

## 2014-08-16 LAB — BASIC METABOLIC PANEL
Anion gap: 15 (ref 5–15)
BUN: 11 mg/dL (ref 6–23)
CO2: 24 mEq/L (ref 19–32)
Calcium: 10 mg/dL (ref 8.4–10.5)
Chloride: 103 mEq/L (ref 96–112)
Creatinine, Ser: 1.42 mg/dL — ABNORMAL HIGH (ref 0.50–1.10)
GFR calc Af Amer: 45 mL/min — ABNORMAL LOW (ref >90)
GFR calc non Af Amer: 39 mL/min — ABNORMAL LOW (ref >90)
Glucose, Bld: 89 mg/dL (ref 70–99)
Potassium: 3.8 mEq/L (ref 3.7–5.3)
Sodium: 142 mEq/L (ref 137–147)

## 2014-08-16 LAB — RAPID URINE DRUG SCREEN, HOSP PERFORMED
Amphetamines: NOT DETECTED
Barbiturates: NOT DETECTED
Benzodiazepines: NOT DETECTED
Cocaine: NOT DETECTED
Opiates: NOT DETECTED
Tetrahydrocannabinol: NOT DETECTED

## 2014-08-16 LAB — URINALYSIS, ROUTINE W REFLEX MICROSCOPIC
Bilirubin Urine: NEGATIVE
Glucose, UA: NEGATIVE mg/dL
Hgb urine dipstick: NEGATIVE
Ketones, ur: NEGATIVE mg/dL
Nitrite: NEGATIVE
Protein, ur: NEGATIVE mg/dL
Specific Gravity, Urine: 1.005 (ref 1.005–1.030)
Urobilinogen, UA: 0.2 mg/dL (ref 0.0–1.0)
pH: 8 (ref 5.0–8.0)

## 2014-08-16 LAB — CBC WITH DIFFERENTIAL/PLATELET
Basophils Absolute: 0 10*3/uL (ref 0.0–0.1)
Basophils Relative: 0 % (ref 0–1)
Eosinophils Absolute: 0.1 10*3/uL (ref 0.0–0.7)
Eosinophils Relative: 1 % (ref 0–5)
HCT: 39.2 % (ref 36.0–46.0)
Hemoglobin: 13.3 g/dL (ref 12.0–15.0)
Lymphocytes Relative: 30 % (ref 12–46)
Lymphs Abs: 3.1 10*3/uL (ref 0.7–4.0)
MCH: 29.6 pg (ref 26.0–34.0)
MCHC: 33.9 g/dL (ref 30.0–36.0)
MCV: 87.1 fL (ref 78.0–100.0)
Monocytes Absolute: 0.7 10*3/uL (ref 0.1–1.0)
Monocytes Relative: 7 % (ref 3–12)
Neutro Abs: 6.5 10*3/uL (ref 1.7–7.7)
Neutrophils Relative %: 62 % (ref 43–77)
Platelets: 251 10*3/uL (ref 150–400)
RBC: 4.5 MIL/uL (ref 3.87–5.11)
RDW: 13.3 % (ref 11.5–15.5)
WBC: 10.3 10*3/uL (ref 4.0–10.5)

## 2014-08-16 LAB — TROPONIN I: Troponin I: <0.3 ng/mL (ref <0.30)

## 2014-08-16 LAB — URINE MICROSCOPIC-ADD ON

## 2014-08-16 LAB — ETHANOL: Alcohol, Ethyl (B): <11 mg/dL (ref 0–11)

## 2014-08-16 LAB — ACETAMINOPHEN LEVEL: Acetaminophen (Tylenol), Serum: <15 ug/mL (ref 10–30)

## 2014-08-16 LAB — SALICYLATE LEVEL: Salicylate Lvl: <2 mg/dL — ABNORMAL LOW (ref 2.8–20.0)

## 2014-08-16 MED ORDER — ZOLPIDEM TARTRATE 5 MG PO TABS
5.0000 mg | ORAL_TABLET | Freq: Every evening | ORAL | Status: DC | PRN
  Administered 2014-08-16: 5 mg via ORAL
  Filled 2014-08-16: qty 1

## 2014-08-16 MED ORDER — ONDANSETRON HCL 4 MG PO TABS: 4.0000 mg | ORAL_TABLET | Freq: Three times a day (TID) | ORAL | Status: DC | PRN

## 2014-08-16 MED ORDER — ALUM & MAG HYDROXIDE-SIMETH 200-200-20 MG/5ML PO SUSP: 30.0000 mL | ORAL | Status: DC | PRN

## 2014-08-16 MED ORDER — LORAZEPAM 2 MG/ML IJ SOLN
1.0000 mg | Freq: Once | INTRAMUSCULAR | Status: AC
  Administered 2014-08-16: 1 mg via INTRAVENOUS
  Filled 2014-08-16: qty 1

## 2014-08-16 MED ORDER — NICOTINE 21 MG/24HR TD PT24: 21.0000 mg | MEDICATED_PATCH | Freq: Every day | TRANSDERMAL | Status: DC

## 2014-08-16 MED ORDER — ACETAMINOPHEN 325 MG PO TABS: 650.0000 mg | ORAL_TABLET | ORAL | Status: DC | PRN

## 2014-08-16 MED ORDER — LORAZEPAM 1 MG PO TABS
1.0000 mg | ORAL_TABLET | Freq: Three times a day (TID) | ORAL | Status: DC | PRN
Start: 2014-08-16 — End: 2014-08-18
  Administered 2014-08-16 – 2014-08-17 (×2): 1 mg via ORAL
  Filled 2014-08-16 (×2): qty 1
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
## 2014-08-16 NOTE — ED Provider Notes (Signed)
Patient signed out to me by Aleen Campi, NP at change of shift. Patient with worsening auditory hallucinations and manic type behavior. If labs are unremarkable will consult TTS.   Patient is sitting in bed eating dinner tray. She currently has no complaints. Labs unremarkable. TTS consulted.   Patient accepted for inpatient therapy. Currently working on placement.   Results for orders placed during the hospital encounter of 08/16/14  TROPONIN I      Result Value Ref Range   Troponin I <0.30  <0.30 ng/mL  BASIC METABOLIC PANEL      Result Value Ref Range   Sodium 142  137 - 147 mEq/L   Potassium 3.8  3.7 - 5.3 mEq/L   Chloride 103  96 - 112 mEq/L   CO2 24  19 - 32 mEq/L   Glucose, Bld 89  70 - 99 mg/dL   BUN 11  6 - 23 mg/dL   Creatinine, Ser 1.61 (*) 0.50 - 1.10 mg/dL   Calcium 09.6  8.4 - 04.5 mg/dL   GFR calc non Af Amer 39 (*) >90 mL/min   GFR calc Af Amer 45 (*) >90 mL/min   Anion gap 15  5 - 15  CBC WITH DIFFERENTIAL      Result Value Ref Range   WBC 10.3  4.0 - 10.5 K/uL   RBC 4.50  3.87 - 5.11 MIL/uL   Hemoglobin 13.3  12.0 - 15.0 g/dL   HCT 40.9  81.1 - 91.4 %   MCV 87.1  78.0 - 100.0 fL   MCH 29.6  26.0 - 34.0 pg   MCHC 33.9  30.0 - 36.0 g/dL   RDW 78.2  95.6 - 21.3 %   Platelets 251  150 - 400 K/uL   Neutrophils Relative % 62  43 - 77 %   Neutro Abs 6.5  1.7 - 7.7 K/uL   Lymphocytes Relative 30  12 - 46 %   Lymphs Abs 3.1  0.7 - 4.0 K/uL   Monocytes Relative 7  3 - 12 %   Monocytes Absolute 0.7  0.1 - 1.0 K/uL   Eosinophils Relative 1  0 - 5 %   Eosinophils Absolute 0.1  0.0 - 0.7 K/uL   Basophils Relative 0  0 - 1 %   Basophils Absolute 0.0  0.0 - 0.1 K/uL  URINALYSIS, ROUTINE W REFLEX MICROSCOPIC      Result Value Ref Range   Color, Urine YELLOW  YELLOW   APPearance CLOUDY (*) CLEAR   Specific Gravity, Urine 1.005  1.005 - 1.030   pH 8.0  5.0 - 8.0   Glucose, UA NEGATIVE  NEGATIVE mg/dL   Hgb urine dipstick NEGATIVE  NEGATIVE   Bilirubin Urine NEGATIVE   NEGATIVE   Ketones, ur NEGATIVE  NEGATIVE mg/dL   Protein, ur NEGATIVE  NEGATIVE mg/dL   Urobilinogen, UA 0.2  0.0 - 1.0 mg/dL   Nitrite NEGATIVE  NEGATIVE   Leukocytes, UA SMALL (*) NEGATIVE  URINE RAPID DRUG SCREEN (HOSP PERFORMED)      Result Value Ref Range   Opiates NONE DETECTED  NONE DETECTED   Cocaine NONE DETECTED  NONE DETECTED   Benzodiazepines NONE DETECTED  NONE DETECTED   Amphetamines NONE DETECTED  NONE DETECTED   Tetrahydrocannabinol NONE DETECTED  NONE DETECTED   Barbiturates NONE DETECTED  NONE DETECTED  ETHANOL      Result Value Ref Range   Alcohol, Ethyl (B) <11  0 - 11 mg/dL  SALICYLATE LEVEL  Result Value Ref Range   Salicylate Lvl <2.0 (*) 2.8 - 20.0 mg/dL  ACETAMINOPHEN LEVEL      Result Value Ref Range   Acetaminophen (Tylenol), Serum <15.0  10 - 30 ug/mL  URINE MICROSCOPIC-ADD ON      Result Value Ref Range   Squamous Epithelial / LPF FEW (*) RARE   WBC, UA 0-2  <3 WBC/hpf   Bacteria, UA RARE  RARE   Dg Chest 2 View  08/16/2014   CLINICAL DATA:  Altered mental status, personal history of hypertension, former smoker, questionable fall, past history of diabetes mellitus, GERD, hypertension  EXAM: CHEST  2 VIEW  COMPARISON:  08/01/2013  FINDINGS: Upper normal heart size.  Normal mediastinal contours and pulmonary vascularity.  Lungs clear.  No pleural effusion or pneumothorax.  Bones unremarkable.  IMPRESSION: No acute abnormalities.   Electronically Signed   By: Ulyses Southward M.D.   On: 08/16/2014 15:22     Mora Bellman, PA-C 08/16/14 1941

## 2014-08-16 NOTE — ED Notes (Signed)
Pt's daughter sts pt has hx of "chemical imbalance" and had similar episodes in past when her medications were changed

## 2014-08-16 NOTE — ED Notes (Signed)
Called unit for report. On hold for over 5 minutes.

## 2014-08-16 NOTE — ED Provider Notes (Signed)
CSN: [id number]     Arrival date & time 08/16/14  1326 History   First MD Initiated Contact with Patient 08/16/14 1407     Chief Complaint  Patient presents with  . Altered Mental Status   (Consider location/radiation/quality/duration/timing/severity/associated sxs/prior Treatment) HPI Desiree [name] is a 63 yo female presenting here with report of change in behavior over the last week.  Pt states she has been feeling the holy spirit more this week but her family and other church members have become concerned with her behavior.  Her family reports while in church last week she began speaking in tongues and her voice changed.  She begins to yell and get agitated and begins praying loudly and "thanking God."   She states while at home she will begin praying and then get in the shower with the hot water on and "praise the Lord."  She reports while reading the Bible this week, she began having auditory hallucinations saying "let those with ears, hear and let those with eyes, see".  Her daughter feels like this is a result of the addition of doxepin to her meds, however that med was added by her psychiatrist 2 days ago and these symptoms have been going on for a week.  She endorses some intermittent chest pain and shortness of breath during the episodes, but denies hemoptysis, fever, nausea, or vomiting.  Past Medical History  Diagnosis Date  . Hyperlipidemia   . GERD (gastroesophageal reflux disease)   . Depression   . HH (hiatus hernia)   . FH: colonic polyps   . Fractured elbow     right   . History of transfusion of packed red blood cells   . Bipolar disorder   . Osteoarthritis of more than one site   . Seasonal allergies   . History of carpal tunnel syndrome   . Neuroleptic-induced tardive dyskinesia   . Diabetes mellitus without complication   . CANDIDIASIS, ESOPHAGEAL 07/28/2009    Qualifier: Diagnosis of  By: Fabian Sharp MD, Neta Mends    Past Surgical History  Procedure Laterality Date  .  Back fusion  2002  . Rt. toe bunion    . Knee surgery    . Juvara osteomy    . Carpal tunnel release  yates    left  . External ear surgery Left   . Finger surgery Left   . Colonoscopy N/A 01/05/2014    Procedure: COLONOSCOPY;  Surgeon: Charna , MD;  Location: [facility];  Service: Endoscopy;  Laterality: N/A;   Family History  Problem Relation Age of Onset  . Hypertension Father   . Throat cancer Father   . Diabetes Father   . Kidney disease Brother    History  Substance Use Topics  . Smoking status: Former Smoker -- 2.00 packs/day for 20 years    Types: Cigarettes    Quit date: 10/23/2002  . Smokeless tobacco: Never Used  . Alcohol Use: No   OB History   Grav Para Term Preterm Abortions TAB SAB Ect Mult Living                 Review of Systems  Constitutional: Negative for fever and chills.  HENT: Negative for sore throat.   Eyes: Negative for visual disturbance.  Respiratory: Positive for shortness of breath. Negative for cough.   Cardiovascular: Positive for chest pain. Negative for leg swelling.  Gastrointestinal: Negative for nausea, vomiting and diarrhea.  Genitourinary: Negative for dysuria.  Musculoskeletal: Negative for myalgias.  Skin: Negative for rash.  Neurological: Negative for weakness, numbness and headaches.  Psychiatric/Behavioral: Positive for hallucinations, behavioral problems, sleep disturbance and agitation. Negative for suicidal ideas and self-injury.    Allergies  Amoxicillin; Codeine; Hydrocodone-acetaminophen; and Penicillins  Home Medications   Prior to Admission medications   Medication Sig Start Date End Date Taking? Authorizing Provider  doxepin (SINEQUAN) 75 MG capsule Take 75-150 mg by mouth at bedtime as needed.   Yes Historical Provider, MD  glucose blood (ACCU-CHEK AVIVA PLUS) test strip Use to check blood sugar 1-2 times daily 02/24/14  Yes Madelin Headings, MD  mirtazapine (REMERON) 15 MG tablet Take 15 mg by mouth at  bedtime.   Yes Historical Provider, MD  mometasone (NASONEX) 50 MCG/ACT nasal spray Place 2 sprays into the nose daily. 05/04/14  Yes Madelin Headings, MD  Multiple Vitamins-Minerals (CENTRUM SILVER PO) Take 1 tablet by mouth daily.    Yes Historical Provider, MD  neomycin-bacitracin-polymyxin (NEOSPORIN) ointment Apply 1 application topically every 12 (twelve) hours.    Yes Historical Provider, MD  pantoprazole (PROTONIX) 20 MG tablet Take 20 mg by mouth daily.   Yes Historical Provider, MD  prazosin (MINIPRESS) 1 MG capsule Take 1 mg by mouth at bedtime.  10/08/13  Yes Historical Provider, MD  rosuvastatin (CRESTOR) 10 MG tablet Take 10 mg by mouth daily.   Yes Historical Provider, MD  traMADol (ULTRAM) 50 MG tablet Take 50 mg by mouth 2 (two) times daily as needed for pain.    Yes Historical Provider, MD   BP 165/117  Pulse 112  Temp(Src) 98.5 F (36.9 C) (Oral)  Resp 20  SpO2 99% Physical Exam  Nursing note and vitals reviewed. Constitutional: She appears well-developed and well-nourished. No distress.  HENT:  Head: Normocephalic and atraumatic.  Mouth/Throat: Oropharynx is clear and moist. No oropharyngeal exudate.  Eyes: Conjunctivae are normal.  Neck: Neck supple. No thyromegaly present.  Cardiovascular: Normal rate, regular rhythm and intact distal pulses.   Pulmonary/Chest: Effort normal and breath sounds normal. No respiratory distress. She has no wheezes. She has no rales. She exhibits no tenderness.  Abdominal: Soft. There is no tenderness.  Musculoskeletal: She exhibits no tenderness.  Lymphadenopathy:    She has no cervical adenopathy.  Neurological: She is alert.  Skin: Skin is warm and dry. No rash noted. She is not diaphoretic.  Psychiatric: She has a normal mood and affect.    ED Course  Procedures (including critical care time) Labs Review Labs Reviewed  URINALYSIS, ROUTINE W REFLEX MICROSCOPIC - Abnormal; Notable for the following:    APPearance CLOUDY (*)     Leukocytes, UA SMALL (*)    All other components within normal limits  URINE MICROSCOPIC-ADD ON - Abnormal; Notable for the following:    Squamous Epithelial / LPF FEW (*)    All other components within normal limits  CBC WITH DIFFERENTIAL  TROPONIN I  BASIC METABOLIC PANEL  URINE RAPID DRUG SCREEN (HOSP PERFORMED)  ETHANOL  SALICYLATE LEVEL  ACETAMINOPHEN LEVEL  CBG MONITORING, ED    Imaging Review Dg Chest 2 View  08/16/2014   CLINICAL DATA:  Altered mental status, personal history of hypertension, former smoker, questionable fall, past history of diabetes mellitus, GERD, hypertension  EXAM: CHEST  2 VIEW  COMPARISON:  08/01/2013  FINDINGS: Upper normal heart size.  Normal mediastinal contours and pulmonary vascularity.  Lungs clear.  No pleural effusion or pneumothorax.  Bones unremarkable.  IMPRESSION: No acute abnormalities.   Electronically Signed  By: Ulyses Southward M.D.   On: 08/16/2014 15:22     EKG Interpretation None      MDM   Final diagnoses:  None   63 yo female presenting with change in behavior in the last week.  Consider exacerbation of pt's pre-exisiting bipolar disorder vs cardiac etiology vs infectious source.  CBC, BMP,  Troponin, UA, UDS, Salicylate, Acetaminophen, ETOH, CXR ordered.   4:22 PM At shift change, hand-off report given to [facility], PA-C.    Filed Vitals:   08/16/14 1330 08/16/14 1521  BP: 165/117 172/93  Pulse: 112 102  Temp: 98.5 F (36.9 C)   TempSrc: Oral   Resp: 20 24  SpO2: 99% 100%   Meds given in ED:  Medications - No data to display  New Prescriptions   No medications on file      Harle Battiest, NP 08/19/14 0136

## 2014-08-16 NOTE — ED Notes (Signed)
Per daughter pt's medications were changed recently and since then she has been acting "strange". Pt has religious preoccupations, talking about "holly spirit taking over her". Pt is constantly repeating " I don't need doctors I have Jesus", patient was brought here from church.

## 2014-08-16 NOTE — ED Provider Notes (Signed)
Medical screening examination/treatment/procedure(s) were conducted as a shared visit with non-physician practitioner(s) and myself.  I personally evaluated the patient during the encounter.   EKG Interpretation   Date/Time:  Sunday August 16 2014 15:20:25 EDT Ventricular Rate:  99 PR Interval:  146 QRS Duration: 81 QT Interval:  405 QTC Calculation: 520 R Axis:   -20 Text Interpretation:  Sinus rhythm Consider left ventricular hypertrophy  Anterior Q waves, possibly due to LVH Prolonged QT interval Baseline  wander in lead(s) I aVR aVL V3 No significant change since Jan 2015  Confirmed by Criss Alvine  MD, Cordarrell Sane [phone]) on 08/16/2014 5:34:14 PM       Patient with symptoms similar to when she's had psychosis from bipolar with lots of religious effects. Currently stable. Psych to consult, dispo per them  Audree Camel, MD 08/16/14 [phone]

## 2014-08-17 ENCOUNTER — Encounter (HOSPITAL_COMMUNITY): Payer: Self-pay | Admitting: Psychiatry

## 2014-08-17 DIAGNOSIS — F3110 Bipolar disorder, current episode manic without psychotic features, unspecified: Secondary | ICD-10-CM

## 2014-08-17 LAB — CBG MONITORING, ED: Glucose-Capillary: 89 mg/dL (ref 70–99)

## 2014-08-17 MED ORDER — PANTOPRAZOLE SODIUM 20 MG PO TBEC
20.0000 mg | DELAYED_RELEASE_TABLET | Freq: Every day | ORAL | Status: DC
  Administered 2014-08-17 – 2014-08-18 (×2): 20 mg via ORAL
  Filled 2014-08-17 (×2): qty 1

## 2014-08-17 MED ORDER — ROSUVASTATIN CALCIUM 10 MG PO TABS
10.0000 mg | ORAL_TABLET | Freq: Every day | ORAL | Status: DC
  Administered 2014-08-17 – 2014-08-18 (×2): 10 mg via ORAL
  Filled 2014-08-17 (×3): qty 1

## 2014-08-17 MED ORDER — TRAMADOL HCL 50 MG PO TABS
50.0000 mg | ORAL_TABLET | Freq: Two times a day (BID) | ORAL | Status: DC | PRN
  Administered 2014-08-17: 50 mg via ORAL
  Filled 2014-08-17: qty 1

## 2014-08-17 MED ORDER — RISPERIDONE 1 MG PO TABS
1.0000 mg | ORAL_TABLET | Freq: Every day | ORAL | Status: DC
  Administered 2014-08-17: 1 mg via ORAL
  Filled 2014-08-17: qty 1

## 2014-08-17 MED ORDER — DULOXETINE HCL 30 MG PO CPEP
30.0000 mg | ORAL_CAPSULE | Freq: Every day | ORAL | Status: DC
  Administered 2014-08-17 – 2014-08-18 (×2): 30 mg via ORAL
  Filled 2014-08-17 (×2): qty 1

## 2014-08-17 MED ORDER — MIRTAZAPINE 30 MG PO TABS
15.0000 mg | ORAL_TABLET | Freq: Every day | ORAL | Status: DC
  Administered 2014-08-17: 15 mg via ORAL
  Filled 2014-08-17: qty 0.5

## 2014-08-17 MED ORDER — PRAZOSIN HCL 1 MG PO CAPS
1.0000 mg | ORAL_CAPSULE | Freq: Every day | ORAL | Status: DC
  Administered 2014-08-17: 1 mg via ORAL
  Filled 2014-08-17 (×3): qty 1

## 2014-08-17 MED ORDER — LAMOTRIGINE 25 MG PO TABS
25.0000 mg | ORAL_TABLET | Freq: Two times a day (BID) | ORAL | Status: DC
  Administered 2014-08-17 – 2014-08-18 (×2): 25 mg via ORAL
  Filled 2014-08-17 (×6): qty 1

## 2014-08-17 NOTE — Progress Notes (Signed)
Pt will be kept overnight for observance and reassessed in the morning.  Derrell Lolling, MSW  Social Worker [phone]
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
## 2014-08-17 NOTE — ED Notes (Signed)
Patient presents calm and cooperative, denies any thoughts of self harm or thoughts to harm others; denies AVH. NAD

## 2014-08-18 ENCOUNTER — Encounter (HOSPITAL_COMMUNITY): Payer: Self-pay | Admitting: Registered Nurse

## 2014-08-18 MED ORDER — DULOXETINE HCL 60 MG PO CPEP: 60.0000 mg | ORAL_CAPSULE | Freq: Every day | ORAL | Status: DC

## 2014-08-18 MED ORDER — LAMOTRIGINE 25 MG PO TABS: 25.0000 mg | ORAL_TABLET | Freq: Two times a day (BID) | ORAL | Status: DC

## 2014-08-18 MED ORDER — RISPERIDONE 1 MG PO TABS: 1.0000 mg | ORAL_TABLET | Freq: Every day | ORAL | Status: DC

## 2014-08-18 NOTE — [facility] Suicide Risk Assessment (Cosign Needed)
Suicide Risk Assessment  Discharge Assessment     Demographic Factors:  Female  Total Time spent with patient: 30 minutes  Psychiatric Specialty Exam:     Blood pressure 99/60, pulse 80, temperature 98 F (36.7 C), temperature source Oral, resp. rate 16, SpO2 94.00%.There is no weight on file to calculate BMI.   General Appearance: Fairly Groomed   Patent attorney:: Good   Speech: Normal Rate   Volume: Normal   Mood: Anxious   Affect: Congruent   Thought Process: Circumstantial and Coherent   Orientation: Full (Time, Place, and Person)   Thought Content: WDL   Suicidal Thoughts: No   Homicidal Thoughts: No   Memory: Immediate; Fair  Recent; Fair  Remote; Fair   Judgement: Fair   Insight: Fair and Present   Psychomotor Activity: Normal   Concentration: Fair   Recall: [employer] of Knowledge:Fair   Language: Fair   Akathisia: NA   Handed: Right   AIMS (if indicated):   Assets: Communication Skills  Desire for Improvement  Housing  Intimacy  Physical Health  Social Support   Sleep:   Musculoskeletal:  Strength & Muscle Tone: within normal limits  Gait & Station: normal  Patient leans: N/A   Mental Status Per Nursing Assessment::   On Admission:     Current Mental Status by Physician: Patient denies suicidal/homicidal ideation, psychosis, and prarnoia  Loss Factors: NA  Historical Factors: NA  Risk Reduction Factors:   NA  Continued Clinical Symptoms:  Bipolar  Cognitive Features That Contribute To Risk:  None noted    Suicide Risk:  Minimal: No identifiable suicidal ideation.  Patients presenting with no risk factors but with morbid ruminations; may be classified as minimal risk based on the severity of the depressive symptoms  Discharge Diagnoses:  AXIS I: Bipolar, Manic  AXIS II: Deferred  AXIS III:  Past Medical History   Diagnosis  Date   .  Hyperlipidemia    .  GERD (gastroesophageal reflux disease)    .  Depression    .  HH (hiatus  hernia)    .  FH: colonic polyps    .  Fractured elbow      right   .  History of transfusion of packed red blood cells    .  Bipolar disorder    .  Osteoarthritis of more than one site    .  Seasonal allergies    .  History of carpal tunnel syndrome    .  Neuroleptic-induced tardive dyskinesia    .  Diabetes mellitus without complication    .  CANDIDIASIS, ESOPHAGEAL  07/28/2009     Qualifier: Diagnosis of By: Fabian Sharp MD, Neta Mends    AXIS IV: economic problems, housing problems, occupational problems, other psychosocial or environmental problems, problems related to social environment and problems with access to health care services  AXIS V: 61-70 mild symptoms  Plan Of Care/Follow-up recommendations:  Activity:  as tolerated Diet:  as tolerated  Is patient on multiple antipsychotic therapies at discharge:  No   Has Patient had three or more failed trials of antipsychotic monotherapy by history:  No  Recommended Plan for Multiple Antipsychotic Therapies: NA    Mande Auvil, FNP-BC 08/18/2014, 12:11 PM

## 2014-08-18 NOTE — Discharge Instructions (Signed)
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
## 2014-08-19 ENCOUNTER — Telehealth: Payer: Self-pay | Admitting: Internal Medicine

## 2014-08-19 DIAGNOSIS — M549 Dorsalgia, unspecified: Secondary | ICD-10-CM

## 2014-08-19 NOTE — Telephone Encounter (Signed)
Referral placed in the system. 

## 2014-08-19 NOTE — Telephone Encounter (Signed)
Authorization done # [id number] 09/14/2014 - 03/13/2015 [facility]: Dagoberto Ligas MD  Address: [address] [place], [place] [address]  [phone]

## 2014-08-19 NOTE — Consult Note (Signed)
Face to face evaluation and I agree with this note 

## 2014-08-19 NOTE — Telephone Encounter (Signed)
Pt is requesting a referral to see Dr Annell Greening for back pain . He performed back surgery on her a few years ago.Marland Kitchen

## 2014-08-19 NOTE — Telephone Encounter (Signed)
i assume the dx is back pain   Need reason and ok then to refer.

## 2014-08-19 NOTE — Telephone Encounter (Signed)
Pt needs referral for her yearly appt w/ dr Zetta Bills at [facility] [address] [place], [place] [address] [phone]  Pt has appt 11/23

## 2014-08-20 NOTE — ED Provider Notes (Signed)
Medical screening examination/treatment/procedure(s) were conducted as a shared visit with non-physician practitioner(s) and myself.  I personally evaluated the patient during the encounter.   EKG Interpretation   Date/Time:  Sunday August 16 2014 15:20:25 EDT Ventricular Rate:  99 PR Interval:  146 QRS Duration: 81 QT Interval:  405 QTC Calculation: 520 R Axis:   -20 Text Interpretation:  Sinus rhythm Consider left ventricular hypertrophy  Anterior Q waves, possibly due to LVH Prolonged QT interval Baseline  wander in lead(s) I aVR aVL V3 No significant change since Jan 2015  Confirmed by Criss Alvine  MD, Nance Mccombs [phone]) on 08/16/2014 5:34:14 PM       See my other note  Audree Camel, MD 08/20/14 [phone]

## 2014-08-30 ENCOUNTER — Emergency Department (HOSPITAL_COMMUNITY): Payer: Medicare HMO

## 2014-08-30 ENCOUNTER — Emergency Department (HOSPITAL_COMMUNITY)
Admission: EM | Admit: 2014-08-30 | Discharge: 2014-09-01 | Disposition: A | Discharge status: Home / Self Care | Payer: Medicare HMO | Attending: Emergency Medicine | Admitting: Emergency Medicine

## 2014-08-30 ENCOUNTER — Encounter (HOSPITAL_COMMUNITY): Payer: Self-pay | Admitting: *Deleted

## 2014-08-30 DIAGNOSIS — Z8781 Personal history of (healed) traumatic fracture: Secondary | ICD-10-CM | POA: Diagnosis not present

## 2014-08-30 DIAGNOSIS — E119 Type 2 diabetes mellitus without complications: Secondary | ICD-10-CM | POA: Insufficient documentation

## 2014-08-30 DIAGNOSIS — M79674 Pain in right toe(s): Secondary | ICD-10-CM | POA: Insufficient documentation

## 2014-08-30 DIAGNOSIS — Z79899 Other long term (current) drug therapy: Secondary | ICD-10-CM | POA: Diagnosis not present

## 2014-08-30 DIAGNOSIS — Z8619 Personal history of other infectious and parasitic diseases: Secondary | ICD-10-CM | POA: Diagnosis not present

## 2014-08-30 DIAGNOSIS — Z7951 Long term (current) use of inhaled steroids: Secondary | ICD-10-CM | POA: Insufficient documentation

## 2014-08-30 DIAGNOSIS — K219 Gastro-esophageal reflux disease without esophagitis: Secondary | ICD-10-CM | POA: Diagnosis not present

## 2014-08-30 DIAGNOSIS — Z86010 Personal history of colonic polyps: Secondary | ICD-10-CM | POA: Diagnosis not present

## 2014-08-30 DIAGNOSIS — F315 Bipolar disorder, current episode depressed, severe, with psychotic features: Secondary | ICD-10-CM | POA: Diagnosis not present

## 2014-08-30 DIAGNOSIS — R443 Hallucinations, unspecified: Secondary | ICD-10-CM

## 2014-08-30 DIAGNOSIS — E785 Hyperlipidemia, unspecified: Secondary | ICD-10-CM | POA: Insufficient documentation

## 2014-08-30 DIAGNOSIS — Z87891 Personal history of nicotine dependence: Secondary | ICD-10-CM | POA: Diagnosis not present

## 2014-08-30 DIAGNOSIS — Z8669 Personal history of other diseases of the nervous system and sense organs: Secondary | ICD-10-CM | POA: Insufficient documentation

## 2014-08-30 DIAGNOSIS — F3132 Bipolar disorder, current episode depressed, moderate: Secondary | ICD-10-CM | POA: Diagnosis not present

## 2014-08-30 DIAGNOSIS — M79676 Pain in unspecified toe(s): Secondary | ICD-10-CM

## 2014-08-30 DIAGNOSIS — Z880 Allergy status to penicillin: Secondary | ICD-10-CM | POA: Insufficient documentation
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
## 2014-08-30 NOTE — ED Provider Notes (Signed)
CSN: [id number]     Arrival date & time 08/30/14  2300 History   First MD Initiated Contact with Patient 08/30/14 2321     Chief Complaint  Patient presents with  . Hallucinations     (Consider location/radiation/quality/duration/timing/severity/associated sxs/prior Treatment) HPI Comments: Patient presents with hallucinations. She has a history of bipolar disorder and was recently assessed here at the [facility] ED for similar complaint. She states that she stayed for 2-3 days and was discharged because she was feeling better. She was started on Restoril and Lamictal in addition to the Remeron and Seroquel that she's already taking. She states that she is continuing to have hallucinations. She has sensation that someone is pulling on her feet. She's also had hallucinations of people banging on the door. She wakes up screaming at times. She doesn't feel like she needs to be hospitalized at this point but she wants someone to go over her medications and make sure that she is taking them correctly. She's not sure that she needs to be taking all of these medications together. She denies any suicidal or homicidal ideations. She does have some tenderness to her right fourth toe. She states that she was walking up some steps and it got caught on a stair. It's been red and swollen since that happened last night. She states it is less red today than it was last night.   Past Medical History  Diagnosis Date  . Hyperlipidemia   . GERD (gastroesophageal reflux disease)   . Depression   . HH (hiatus hernia)   . FH: colonic polyps   . Fractured elbow     right   . History of transfusion of packed red blood cells   . Bipolar disorder   . Osteoarthritis of more than one site   . Seasonal allergies   . History of carpal tunnel syndrome   . Neuroleptic-induced tardive dyskinesia   . Diabetes mellitus without complication   . CANDIDIASIS, ESOPHAGEAL 07/28/2009    Qualifier: Diagnosis of  By: Fabian Sharp MD,  Neta Mends    Past Surgical History  Procedure Laterality Date  . Back fusion  2002  . Rt. toe bunion    . Knee surgery    . Juvara osteomy    . Carpal tunnel release  yates    left  . External ear surgery Left   . Finger surgery Left   . Colonoscopy N/A 01/05/2014    Procedure: COLONOSCOPY;  Surgeon: Charna Elizabeth, MD;  Location: [facility];  Service: Endoscopy;  Laterality: N/A;   Family History  Problem Relation Age of Onset  . Hypertension Father   . Throat cancer Father   . Diabetes Father   . Kidney disease Brother    History  Substance Use Topics  . Smoking status: Former Smoker -- 2.00 packs/day for 20 years    Types: Cigarettes    Quit date: 10/23/2002  . Smokeless tobacco: Never Used  . Alcohol Use: No   OB History    No data available     Review of Systems  Constitutional: Negative for fever, chills, diaphoresis and fatigue.  HENT: Negative for congestion, rhinorrhea and sneezing.   Eyes: Negative.   Respiratory: Negative for cough, chest tightness and shortness of breath.   Cardiovascular: Negative for chest pain and leg swelling.  Gastrointestinal: Negative for nausea, vomiting, abdominal pain, diarrhea and blood in stool.  Genitourinary: Negative for frequency, hematuria, flank pain and difficulty urinating.  Musculoskeletal: Positive for arthralgias. Negative  for back pain.  Skin: Negative for rash.  Neurological: Negative for dizziness, speech difficulty, weakness, numbness and headaches.  Psychiatric/Behavioral: Positive for hallucinations. Negative for suicidal ideas.      Allergies  Amoxicillin; Codeine; Hydrocodone-acetaminophen; and Penicillins  Home Medications   Prior to Admission medications   Medication Sig Start Date End Date Taking? Authorizing Provider  DULoxetine (CYMBALTA) 60 MG capsule Take 1 capsule (60 mg total) by mouth daily. 08/18/14  Yes Shuvon Rankin, NP  lamoTRIgine (LAMICTAL) 25 MG tablet Take 1 tablet (25 mg total) by  mouth 2 (two) times daily. 08/18/14  Yes Shuvon Rankin, NP  loperamide (IMODIUM) 2 MG capsule Take 2 mg by mouth as needed for diarrhea or loose stools.   Yes Historical Provider, MD  mirtazapine (REMERON) 15 MG tablet Take 15 mg by mouth at bedtime.   Yes Historical Provider, MD  mometasone (NASONEX) 50 MCG/ACT nasal spray Place 2 sprays into the nose daily. 05/04/14  Yes Madelin Headings, MD  Multiple Vitamins-Minerals (CENTRUM SILVER PO) Take 1 tablet by mouth daily.    Yes Historical Provider, MD  pantoprazole (PROTONIX) 40 MG tablet Take 40 mg by mouth daily.   Yes Historical Provider, MD  prazosin (MINIPRESS) 2 MG capsule Take 4 mg by mouth at bedtime.   Yes Historical Provider, MD  promethazine (PHENERGAN) 25 MG tablet Take 25 mg by mouth every 6 (six) hours as needed for nausea or vomiting.   Yes Historical Provider, MD  QUEtiapine (SEROQUEL) 50 MG tablet Take 50 mg by mouth at bedtime.   Yes Historical Provider, MD  risperiDONE (RISPERDAL) 1 MG tablet Take 1 tablet (1 mg total) by mouth at bedtime. 08/18/14  Yes Shuvon Rankin, NP  rosuvastatin (CRESTOR) 10 MG tablet Take 10 mg by mouth daily.   Yes Historical Provider, MD  traMADol (ULTRAM) 50 MG tablet Take 50 mg by mouth 2 (two) times daily as needed for pain.    Yes Historical Provider, MD  glucose blood (ACCU-CHEK AVIVA PLUS) test strip Use to check blood sugar 1-2 times daily 02/24/14   Madelin Headings, MD  neomycin-bacitracin-polymyxin (NEOSPORIN) ointment Apply 1 application topically every 12 (twelve) hours.     Historical Provider, MD  pantoprazole (PROTONIX) 20 MG tablet Take 20 mg by mouth daily.    Historical Provider, MD  prazosin (MINIPRESS) 1 MG capsule Take 1 mg by mouth at bedtime.  10/08/13   Historical Provider, MD   BP 138/75 mmHg  Pulse 88  Temp(Src) 98.8 F (37.1 C) (Oral)  Resp 18  SpO2 96% Physical Exam  Constitutional: She is oriented to person, place, and time. She appears well-developed and well-nourished.   HENT:  Head: Normocephalic and atraumatic.  Eyes: Pupils are equal, round, and reactive to light.  Neck: Normal range of motion. Neck supple.  Cardiovascular: Normal rate, regular rhythm and normal heart sounds.   Pulmonary/Chest: Effort normal and breath sounds normal. No respiratory distress. She has no wheezes. She has no rales. She exhibits no tenderness.  Abdominal: Soft. Bowel sounds are normal. There is no tenderness. There is no rebound and no guarding.  Musculoskeletal: Normal range of motion. She exhibits no edema.  Mild erythema/ecchymosis to right 4th toe, dorsal side.  Mild TTP to this area.  No deformity, no wound noted.  No redness to remainder of foot  Lymphadenopathy:    She has no cervical adenopathy.  Neurological: She is alert and oriented to person, place, and time.  Skin: Skin is warm and  dry. No rash noted.  Psychiatric: She has a normal mood and affect.    ED Course  Procedures (including critical care time) Labs Review Labs Reviewed  I-STAT CHEM 8, ED - Abnormal; Notable for the following:    Potassium 3.3 (*)    BUN 5 (*)    Creatinine, Ser 1.30 (*)    Glucose, Bld 138 (*)    All other components within normal limits  URINE RAPID DRUG SCREEN (HOSP PERFORMED)  ETHANOL    Imaging Review Dg Toe 4th Left  08/31/2014   CLINICAL DATA:  Left fourth toe pain and stinging beginning 08/29/2014. No known injury.  EXAM: LEFT FOURTH TOE  COMPARISON:  Plain films left foot 08/16/2012.  FINDINGS: No acute bony or joint abnormality is identified. Soft tissues are unremarkable. The patient is status post hallux valgus repair.  IMPRESSION: Normal-appearing fourth toe.   Electronically Signed   By: Drusilla Kanner M.D.   On: 08/31/2014 00:06     EKG Interpretation None      MDM   Final diagnoses:  Toe pain  Hallucinations    Pt presents with hallucinations.  No SI/HI.  Pt has been assessed by TTS and feels that pt needs to stay until the am to see psychiatry.   Pt is ok with this.  Creatinine improved slightly since last visit.  No evidence of fracture to toe.  Appears to be ecchymotic.  Doubt cellulitis.  Psych holding orders placed.    Rolan Bucco, MD 08/31/14 [phone]

## 2014-08-30 NOTE — ED Notes (Signed)
Pt transported to radiology via stretcher 

## 2014-08-31 DIAGNOSIS — F3132 Bipolar disorder, current episode depressed, moderate: Secondary | ICD-10-CM | POA: Diagnosis present

## 2014-08-31 DIAGNOSIS — F315 Bipolar disorder, current episode depressed, severe, with psychotic features: Secondary | ICD-10-CM | POA: Diagnosis not present

## 2014-08-31 LAB — RAPID URINE DRUG SCREEN, HOSP PERFORMED
Amphetamines: NOT DETECTED
Barbiturates: NOT DETECTED
Benzodiazepines: NOT DETECTED
Cocaine: NOT DETECTED
Opiates: NOT DETECTED
Tetrahydrocannabinol: NOT DETECTED

## 2014-08-31 LAB — I-STAT CHEM 8, ED
BUN: 5 mg/dL — ABNORMAL LOW (ref 6–23)
Calcium, Ion: 1.13 mmol/L (ref 1.13–1.30)
Chloride: 102 mEq/L (ref 96–112)
Creatinine, Ser: 1.3 mg/dL — ABNORMAL HIGH (ref 0.50–1.10)
Glucose, Bld: 138 mg/dL — ABNORMAL HIGH (ref 70–99)
HCT: 42 % (ref 36.0–46.0)
Hemoglobin: 14.3 g/dL (ref 12.0–15.0)
Potassium: 3.3 mEq/L — ABNORMAL LOW (ref 3.7–5.3)
Sodium: 140 mEq/L (ref 137–147)
TCO2: 25 mmol/L (ref 0–100)

## 2014-08-31 LAB — ETHANOL: Alcohol, Ethyl (B): <11 mg/dL (ref 0–11)

## 2014-08-31 MED ORDER — PRAZOSIN HCL 2 MG PO CAPS
4.0000 mg | ORAL_CAPSULE | Freq: Every day | ORAL | Status: DC
  Administered 2014-08-31: 4 mg via ORAL
  Filled 2014-08-31 (×2): qty 2

## 2014-08-31 MED ORDER — IBUPROFEN 200 MG PO TABS: 600.0000 mg | ORAL_TABLET | Freq: Three times a day (TID) | ORAL | Status: DC | PRN

## 2014-08-31 MED ORDER — PRAZOSIN HCL 2 MG PO CAPS
2.0000 mg | ORAL_CAPSULE | Freq: Every day | ORAL | Status: DC
  Administered 2014-08-31: 2 mg via ORAL
  Filled 2014-08-31 (×2): qty 1

## 2014-08-31 MED ORDER — IBUPROFEN 800 MG PO TABS: 800.0000 mg | ORAL_TABLET | Freq: Four times a day (QID) | ORAL | Status: DC | PRN

## 2014-08-31 MED ORDER — IBUPROFEN 200 MG PO TABS: 600.0000 mg | ORAL_TABLET | Freq: Four times a day (QID) | ORAL | Status: DC | PRN

## 2014-08-31 MED ORDER — LORAZEPAM 1 MG PO TABS
1.0000 mg | ORAL_TABLET | Freq: Three times a day (TID) | ORAL | Status: DC | PRN
  Administered 2014-08-31: 1 mg via ORAL
  Filled 2014-08-31: qty 1

## 2014-08-31 MED ORDER — PANTOPRAZOLE SODIUM 40 MG PO TBEC
40.0000 mg | DELAYED_RELEASE_TABLET | Freq: Every day | ORAL | Status: DC
  Administered 2014-08-31 – 2014-09-01 (×2): 40 mg via ORAL
  Filled 2014-08-31 (×2): qty 1

## 2014-08-31 MED ORDER — QUETIAPINE FUMARATE 50 MG PO TABS
50.0000 mg | ORAL_TABLET | Freq: Every day | ORAL | Status: DC
  Administered 2014-08-31: 50 mg via ORAL
  Filled 2014-08-31: qty 1

## 2014-08-31 MED ORDER — DULOXETINE HCL 60 MG PO CPEP
60.0000 mg | ORAL_CAPSULE | Freq: Every day | ORAL | Status: DC
  Administered 2014-08-31 – 2014-09-01 (×2): 60 mg via ORAL
  Filled 2014-08-31 (×3): qty 1

## 2014-08-31 MED ORDER — NAPROXEN 375 MG PO TABS
375.0000 mg | ORAL_TABLET | Freq: Two times a day (BID) | ORAL | Status: DC
  Administered 2014-09-01: 375 mg via ORAL
  Filled 2014-08-31 (×4): qty 1

## 2014-08-31 MED ORDER — QUETIAPINE FUMARATE 100 MG PO TABS
100.0000 mg | ORAL_TABLET | Freq: Every day | ORAL | Status: DC
  Administered 2014-08-31: 100 mg via ORAL
  Filled 2014-08-31: qty 1

## 2014-08-31 MED ORDER — RISPERIDONE 1 MG PO TABS
1.0000 mg | ORAL_TABLET | Freq: Every day | ORAL | Status: DC
  Administered 2014-08-31: 1 mg via ORAL
  Filled 2014-08-31: qty 1

## 2014-08-31 MED ORDER — IBUPROFEN 200 MG PO TABS
600.0000 mg | ORAL_TABLET | Freq: Three times a day (TID) | ORAL | Status: DC | PRN
  Filled 2014-08-31: qty 3

## 2014-08-31 MED ORDER — MIRTAZAPINE 30 MG PO TABS
15.0000 mg | ORAL_TABLET | Freq: Every day | ORAL | Status: DC
  Administered 2014-08-31: 15 mg via ORAL
  Filled 2014-08-31: qty 1

## 2014-08-31 MED ORDER — ROSUVASTATIN CALCIUM 10 MG PO TABS
10.0000 mg | ORAL_TABLET | Freq: Every day | ORAL | Status: DC
  Administered 2014-08-31 – 2014-09-01 (×2): 10 mg via ORAL
  Filled 2014-08-31 (×3): qty 1

## 2014-08-31 MED ORDER — IBUPROFEN 200 MG PO TABS
600.0000 mg | ORAL_TABLET | Freq: Three times a day (TID) | ORAL | Status: DC | PRN
  Administered 2014-08-31: 600 mg via ORAL

## 2014-08-31 MED ORDER — TRAMADOL HCL 50 MG PO TABS
50.0000 mg | ORAL_TABLET | Freq: Two times a day (BID) | ORAL | Status: DC | PRN
  Administered 2014-08-31: 50 mg via ORAL
  Filled 2014-08-31: qty 1

## 2014-08-31 MED ORDER — LAMOTRIGINE 25 MG PO TABS
25.0000 mg | ORAL_TABLET | Freq: Two times a day (BID) | ORAL | Status: DC
  Administered 2014-08-31 – 2014-09-01 (×3): 25 mg via ORAL
  Filled 2014-08-31 (×6): qty 1

## 2014-08-31 MED ORDER — POTASSIUM CHLORIDE CRYS ER 20 MEQ PO TBCR
40.0000 meq | EXTENDED_RELEASE_TABLET | Freq: Once | ORAL | Status: AC
  Administered 2014-08-31: 40 meq via ORAL
  Filled 2014-08-31: qty 2

## 2014-08-31 NOTE — ED Notes (Signed)
Pt resting on stretcher watching TV, denies SI or HI.  Will continue to monitor for safety.
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
## 2014-08-31 NOTE — ED Notes (Signed)
Meal offered to patient. Patient is short with staff and refuses to answer some of staffs questions. Patient states, "do I get to talk now." Writer and staff have given adequate time to patient to express her needs however patient is hostile and guarded with staff.

## 2014-08-31 NOTE — BH Assessment (Signed)
Assessment completed. Consulted Janann August, NP who recommended that pt be evaluated by psychiatry. Dr. Fredderick Phenix has been informed of the recommendation. Pt was also provided with a list of outpatient resources.
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
## 2014-08-31 NOTE — ED Notes (Signed)
Per Desiree [name], TTS pt will stay here until morning to see psychiatry

## 2014-08-31 NOTE — ED Notes (Signed)
TTS at bedside. 

## 2014-09-01 ENCOUNTER — Encounter (HOSPITAL_COMMUNITY): Payer: Self-pay | Admitting: Registered Nurse

## 2014-09-01 LAB — COMPREHENSIVE METABOLIC PANEL
ALT: 18 U/L (ref 0–35)
AST: 27 U/L (ref 0–37)
Albumin: 3.4 g/dL — ABNORMAL LOW (ref 3.5–5.2)
Alkaline Phosphatase: 74 U/L (ref 39–117)
Anion gap: 10 (ref 5–15)
BUN: 9 mg/dL (ref 6–23)
CO2: 28 mEq/L (ref 19–32)
Calcium: 9.1 mg/dL (ref 8.4–10.5)
Chloride: 102 mEq/L (ref 96–112)
Creatinine, Ser: 1.5 mg/dL — ABNORMAL HIGH (ref 0.50–1.10)
GFR calc Af Amer: 42 mL/min — ABNORMAL LOW (ref >90)
GFR calc non Af Amer: 36 mL/min — ABNORMAL LOW (ref >90)
Glucose, Bld: 100 mg/dL — ABNORMAL HIGH (ref 70–99)
Potassium: 4.5 mEq/L (ref 3.7–5.3)
Sodium: 140 mEq/L (ref 137–147)
Total Bilirubin: 0.3 mg/dL (ref 0.3–1.2)
Total Protein: 6.8 g/dL (ref 6.0–8.3)

## 2014-09-01 MED ORDER — LAMOTRIGINE 25 MG PO TABS: 25.0000 mg | ORAL_TABLET | Freq: Two times a day (BID) | ORAL | Status: DC

## 2014-09-01 MED ORDER — PRAZOSIN HCL 2 MG PO CAPS: 2.0000 mg | ORAL_CAPSULE | Freq: Every day | ORAL | Status: DC

## 2014-09-01 MED ORDER — DULOXETINE HCL 60 MG PO CPEP: 60.0000 mg | ORAL_CAPSULE | Freq: Every day | ORAL | Status: DC

## 2014-09-01 MED ORDER — QUETIAPINE FUMARATE 100 MG PO TABS: 100.0000 mg | ORAL_TABLET | Freq: Every day | ORAL | Status: DC

## 2014-09-01 MED ORDER — IBUPROFEN 600 MG PO TABS: 600.0000 mg | ORAL_TABLET | Freq: Three times a day (TID) | ORAL | Status: DC | PRN

## 2014-09-01 MED ORDER — LORAZEPAM 1 MG PO TABS: 1.0000 mg | ORAL_TABLET | Freq: Three times a day (TID) | ORAL | Status: DC | PRN

## 2014-09-01 NOTE — Discharge Instructions (Signed)
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
## 2014-09-01 NOTE — ED Notes (Signed)
Pt reports that she has no si or hi. Pt asked about medications taken at [facility] and received clarification from MD. Pt reports that she has support at home with bible study. Pt's daughter is to pick up pt at d/c. Safety maintained.

## 2014-09-01 NOTE — [facility] Suicide Risk Assessment (Signed)
   Demographic Factors:  Unemployed and female  Total Time spent with patient: 20 minutes  Psychiatric Specialty Exam: Physical Exam  ROS  Blood pressure 103/66, pulse 96, temperature 98 F (36.7 C), temperature source Oral, resp. rate 20, SpO2 100 %.There is no weight on file to calculate BMI.  General Appearance: Disheveled  Eye Contact::  Good  Speech:  Clear and Coherent  Volume:  Normal  Mood:  Euthymic  Affect:  Appropriate  Thought Process:  Logical  Orientation:  Full (Time, Place, and Person)  Thought Content:  Negative  Suicidal Thoughts:  No  Homicidal Thoughts:  No  Memory:  Immediate;   Fair Recent;   Fair Remote;   Fair  Judgement:  Fair  Insight:  Fair  Psychomotor Activity:  Normal  Concentration:  Fair  Recall:  [employer] of Knowledge:Fair  Language: Fair  Akathisia:  No  Handed:  Right  AIMS (if indicated):     Assets:  Communication Skills Desire for Improvement Physical Health  Sleep:       Musculoskeletal: Strength & Muscle Tone: within normal limits Gait & Station: normal Patient leans: N/A   Mental Status Per Nursing Assessment::   On Admission:     Current Mental Status by Physician: patient denies suicidal ideation, intent or plan  Loss Factors: NA  Historical Factors: NA  Risk Reduction Factors:   Sense of responsibility to family, Living with another person, especially a relative and Positive social support  Continued Clinical Symptoms:  Resolving hallucination and sleep problem  Cognitive Features That Contribute To Risk:  Closed-mindedness Polarized thinking    Suicide Risk:  Minimal: No identifiable suicidal ideation.  Patients presenting with no risk factors but with morbid ruminations; may be classified as minimal risk based on the severity of the depressive symptoms  Discharge Diagnoses:   AXIS I: Bipolar affective disorder   AXIS II:  Deferred AXIS III:   Past Medical History  Diagnosis Date  .  Hyperlipidemia   . GERD (gastroesophageal reflux disease)   . Depression   . HH (hiatus hernia)   . FH: colonic polyps   . Fractured elbow     right   . History of transfusion of packed red blood cells   . Bipolar disorder   . Osteoarthritis of more than one site   . Seasonal allergies   . History of carpal tunnel syndrome   . Neuroleptic-induced tardive dyskinesia   . Diabetes mellitus without complication   . CANDIDIASIS, ESOPHAGEAL 07/28/2009    Qualifier: Diagnosis of  By: Fabian Sharp MD, Neta Mends    AXIS IV:  economic problems, other psychosocial or environmental problems and problems related to social environment AXIS V:  61-70 mild symptoms  Plan Of Care/Follow-up recommendations:  Activity:  as tolerated Diet:  healthy  Is patient on multiple antipsychotic therapies at discharge:  No   Has Patient had three or more failed trials of antipsychotic monotherapy by history:  No  Recommended Plan for Multiple Antipsychotic Therapies: NA    Thedore Mins, MD 09/01/2014, 10:34 AM

## 2014-09-21 ENCOUNTER — Telehealth: Payer: Self-pay | Admitting: Internal Medicine

## 2014-09-21 DIAGNOSIS — F3110 Bipolar disorder, current episode manic without psychotic features, unspecified: Secondary | ICD-10-CM

## 2014-09-21 NOTE — Telephone Encounter (Signed)
Pt would like to switch psychiatrist from dr reddy ([facility]) to dr Pleas Koch ([facility]) Pt instructed by insurance company, [employer] to all her pcp for referral for this request.  Pt has an appt w/  Dr Shawnee Knapp @ [facility] on 12/21 at 3pm

## 2014-09-21 NOTE — Telephone Encounter (Signed)
Order placed in the system. 

## 2014-10-05 ENCOUNTER — Telehealth: Payer: Self-pay | Admitting: Internal Medicine

## 2014-10-05 ENCOUNTER — Telehealth: Payer: Self-pay

## 2014-10-05 NOTE — Telephone Encounter (Signed)
Pt following up on med request. ptneeds enough to last through 12/21. Pt has appt w/ psychriatrist 12/21 at 330

## 2014-10-05 NOTE — Telephone Encounter (Signed)
[facility] Night - Client TELEPHONE ADVICE RECORD [facility] Medical Call Center Patient Name: Desiree [name] Gender: Female DOB: [birth year] Age: 63 Y 5 M 23 D Return Phone Number: [phone] (Primary), [phone] (Secondary) Address: [address] City/State/Zip: [place] [address] Client  [facility] - Client Client Site  [facility] - Night Physician Berniece Andreas Contact Type Call Call Type Triage / Clinical Relationship To Patient Self Return Phone Number [phone] (Primary) Chief Complaint Prescription Refill or Medication Request (non symptomatic) Initial Comment Caller states she needs a Rx filled she will be out by Sunday.She was told by the Pharmacy that her PCP would have to authorize the Rx. Nurse Assessment Nurse: [name], RN, [name] Date/Time (Eastern Time): 10/03/2014 10:30:56 PM Please select the assessment type ---Refill Additional Documentation ---Caller states that she is needing her Lamotrigine 25 mg bid, States that her PCP has never written this script. Was written by the ER doctor. Does the patient have enough medication to last until the office opens? ---No Additional Documentation ---Advised patient to contact her pharmacy and request loaner dose to get through until Monday, Advised caller to contact office on Monday to see if PCP will prescribe until she gets in to shee her New Psych MD. Guidelines Guideline Title Affirmed Question Affirmed Notes Nurse Date/Time (Eastern Time) Disp. Time [name] Time) Disposition Final User 10/03/2014 10:35:31 PM Clinical Call Yes [name], RN, [facility] After Care Instructions Given Call Event Type User Date / Time Description

## 2014-10-05 NOTE — Telephone Encounter (Signed)
Psychiatrist should be  Doing this medication   . I do not prescribe these medications  Psychiatry or behavioral health  Should prescribe these meds  . Not PCP   Can help facilitate her getting to her specialist whomever it is

## 2014-10-05 NOTE — Telephone Encounter (Signed)
Patient states she was in the hospital and was prescribed lamoTRIgine (LAMICTAL) 25 MG tablet. Patient wants another re-fill of the medication. She states she just wants enough until she sees her psychiatrist next week.  I advised the patient that she may need a s/p hospital f/u appointment since she was in the hospital for 2 days last month.  Dr. Fabian Sharp doesn't have any openings to schedule one, please advise how to schedule.    [id number] - [place], [place] - [address]

## 2014-10-05 NOTE — Telephone Encounter (Signed)
Pt needs referral to Dr Teodora Medici  [facility] Pt has not made appt yet, but its for her yearly. Pt has seen this dr in the past. Pt needs [facility] referral for insurance

## 2014-10-06 ENCOUNTER — Emergency Department (HOSPITAL_COMMUNITY)
Admission: EM | Admit: 2014-10-06 | Discharge: 2014-10-06 | Disposition: A | Discharge status: Home / Self Care | Payer: Commercial Managed Care - HMO | Attending: Emergency Medicine | Admitting: Emergency Medicine

## 2014-10-06 ENCOUNTER — Telehealth: Payer: Self-pay | Admitting: Internal Medicine

## 2014-10-06 ENCOUNTER — Encounter (HOSPITAL_COMMUNITY): Payer: Self-pay | Admitting: Emergency Medicine

## 2014-10-06 DIAGNOSIS — Z8669 Personal history of other diseases of the nervous system and sense organs: Secondary | ICD-10-CM | POA: Insufficient documentation

## 2014-10-06 DIAGNOSIS — Z760 Encounter for issue of repeat prescription: Secondary | ICD-10-CM | POA: Diagnosis not present

## 2014-10-06 DIAGNOSIS — Z87891 Personal history of nicotine dependence: Secondary | ICD-10-CM | POA: Insufficient documentation

## 2014-10-06 DIAGNOSIS — Z8781 Personal history of (healed) traumatic fracture: Secondary | ICD-10-CM | POA: Insufficient documentation

## 2014-10-06 DIAGNOSIS — M1990 Unspecified osteoarthritis, unspecified site: Secondary | ICD-10-CM | POA: Insufficient documentation

## 2014-10-06 DIAGNOSIS — Z7951 Long term (current) use of inhaled steroids: Secondary | ICD-10-CM | POA: Diagnosis not present

## 2014-10-06 DIAGNOSIS — Z79899 Other long term (current) drug therapy: Secondary | ICD-10-CM | POA: Insufficient documentation

## 2014-10-06 DIAGNOSIS — Z8619 Personal history of other infectious and parasitic diseases: Secondary | ICD-10-CM | POA: Insufficient documentation

## 2014-10-06 DIAGNOSIS — Z880 Allergy status to penicillin: Secondary | ICD-10-CM | POA: Diagnosis not present

## 2014-10-06 DIAGNOSIS — E785 Hyperlipidemia, unspecified: Secondary | ICD-10-CM | POA: Diagnosis not present

## 2014-10-06 DIAGNOSIS — F329 Major depressive disorder, single episode, unspecified: Secondary | ICD-10-CM | POA: Insufficient documentation

## 2014-10-06 DIAGNOSIS — Z792 Long term (current) use of antibiotics: Secondary | ICD-10-CM | POA: Diagnosis not present

## 2014-10-06 DIAGNOSIS — E119 Type 2 diabetes mellitus without complications: Secondary | ICD-10-CM | POA: Insufficient documentation

## 2014-10-06 DIAGNOSIS — K219 Gastro-esophageal reflux disease without esophagitis: Secondary | ICD-10-CM | POA: Diagnosis not present

## 2014-10-06 MED ORDER — LAMOTRIGINE 25 MG PO TABS: 25.0000 mg | ORAL_TABLET | Freq: Two times a day (BID) | ORAL | Status: DC

## 2014-10-06 NOTE — Telephone Encounter (Signed)
Spoke to the pt.  New psych will not fill until seen on 10/12/14.  Pt requesting enough medication to get to the date.  Please advise.  Thanks!

## 2014-10-06 NOTE — Telephone Encounter (Signed)
Called and spoke to the pt.  Informed her that she will need to have psych fill this medication.  Informed me that she is in the middle of changing doctors.  Leaving Dr. Reddy's office and will see her new doctor on 10/12/14.  She will contact new office for a refill.  Instructed to call back if further help is needed.

## 2014-10-06 NOTE — Discharge Instructions (Signed)
Keep your appointment with [facility] on 10/12/2014. Call for a follow up appointment with a Family or Primary Care Provider.  Return if Symptoms worsen.   Take medication as prescribed.

## 2014-10-06 NOTE — Telephone Encounter (Signed)
Pt states ed asked her to relay her bp results:  161/79. Pt states you can let her know if she needs to come in for appt.

## 2014-10-06 NOTE — ED Notes (Signed)
Pt reports to ED for medication refill of Lamotrigine 25 mg.

## 2014-10-06 NOTE — ED Provider Notes (Signed)
CSN: [id number]     Arrival date & time 10/06/14  1557 History   First MD Initiated Contact with Patient 10/06/14 1608     No chief complaint on file.    (Consider location/radiation/quality/duration/timing/severity/associated sxs/prior Treatment) HPI Comments: The patient is a 63 year old female with a past medical history of diabetes, bipolar, depression presents emergency room chief complaint of medication refill. Patient is on Lamictal 25mg  BID since November. She feels as though medication is helping. She reports she is changing behavior health specialist, appointment [facility]/12/[facility] with 10/14/2014, PA-C, paper work in hand.   The history is provided by the patient. No language interpreter was used.    Past Medical History  Diagnosis Date  . Hyperlipidemia   . GERD (gastroesophageal reflux disease)   . Depression   . HH (hiatus hernia)   . FH: colonic polyps   . Fractured elbow     right   . History of transfusion of packed red blood cells   . Bipolar disorder   . Osteoarthritis of more than one site   . Seasonal allergies   . History of carpal tunnel syndrome   . Neuroleptic-induced tardive dyskinesia   . Diabetes mellitus without complication   . CANDIDIASIS, ESOPHAGEAL 07/28/2009    Qualifier: Diagnosis of  By: 09/27/2009 MD, Fabian Sharp    Past Surgical History  Procedure Laterality Date  . Back fusion  2002  . Rt. toe bunion    . Knee surgery    . Juvara osteomy    . Carpal tunnel release  yates    left  . External ear surgery Left   . Finger surgery Left   . Colonoscopy N/A 01/05/2014    Procedure: COLONOSCOPY;  Surgeon: 01/07/2014, MD;  Location: [facility];  Service: Endoscopy;  Laterality: N/A;   Family History  Problem Relation Age of Onset  . Hypertension Father   . Throat cancer Father   . Diabetes Father   . Kidney disease Brother    History  Substance Use Topics  . Smoking status: Former Smoker -- 2.00 packs/day for 20 years   Types: Cigarettes    Quit date: 10/23/2002  . Smokeless tobacco: Never Used  . Alcohol Use: No   OB History    No data available     Review of Systems  Respiratory: Negative for shortness of breath.   Cardiovascular: Negative for chest pain, palpitations and leg swelling.      Allergies  Amoxicillin; Codeine; Hydrocodone-acetaminophen; and Penicillins  Home Medications   Prior to Admission medications   Medication Sig Start Date End Date Taking? Authorizing Provider  DULoxetine (CYMBALTA) 60 MG capsule Take 1 capsule (60 mg total) by mouth daily. 09/01/14   Mojeed Akintayo  glucose blood (ACCU-CHEK AVIVA PLUS) test strip Use to check blood sugar 1-2 times daily 02/24/14   04/26/14, MD  ibuprofen (ADVIL,MOTRIN) 600 MG tablet Take 1 tablet (600 mg total) by mouth every 8 (eight) hours as needed for mild pain. 09/01/14   Mojeed Akintayo  lamoTRIgine (LAMICTAL) 25 MG tablet Take 1 tablet (25 mg total) by mouth 2 (two) times daily. 09/01/14   Mojeed Akintayo  loperamide (IMODIUM) 2 MG capsule Take 2 mg by mouth as needed for diarrhea or loose stools.    Historical Provider, MD  LORazepam (ATIVAN) 1 MG tablet Take 1 tablet (1 mg total) by mouth every 8 (eight) hours as needed for anxiety (agitation). 09/01/14   Mojeed Akintayo  mirtazapine (REMERON)  15 MG tablet Take 15 mg by mouth at bedtime.    Historical Provider, MD  mometasone (NASONEX) 50 MCG/ACT nasal spray Place 2 sprays into the nose daily. 05/04/14   Madelin Headings, MD  Multiple Vitamins-Minerals (CENTRUM SILVER PO) Take 1 tablet by mouth daily.     Historical Provider, MD  neomycin-bacitracin-polymyxin (NEOSPORIN) ointment Apply 1 application topically every 12 (twelve) hours.     Historical Provider, MD  pantoprazole (PROTONIX) 20 MG tablet Take 20 mg by mouth daily.    Historical Provider, MD  pantoprazole (PROTONIX) 40 MG tablet Take 40 mg by mouth daily.    Historical Provider, MD  prazosin (MINIPRESS) 2 MG capsule Take  1 capsule (2 mg total) by mouth at bedtime. 09/01/14   Mojeed Akintayo  promethazine (PHENERGAN) 25 MG tablet Take 25 mg by mouth every 6 (six) hours as needed for nausea or vomiting.    Historical Provider, MD  QUEtiapine (SEROQUEL) 100 MG tablet Take 1 tablet (100 mg total) by mouth at bedtime. 09/01/14   Mojeed Akintayo  risperiDONE (RISPERDAL) 1 MG tablet Take 1 tablet (1 mg total) by mouth at bedtime. 08/18/14   Shuvon Rankin, NP  rosuvastatin (CRESTOR) 10 MG tablet Take 10 mg by mouth daily.    Historical Provider, MD  traMADol (ULTRAM) 50 MG tablet Take 50 mg by mouth 2 (two) times daily as needed for pain.     Historical Provider, MD   BP 161/79 mmHg  Pulse 99  Temp(Src) 98.4 F (36.9 C) (Oral)  Resp 16  SpO2 98% Physical Exam  Constitutional: She is oriented to person, place, and time. She appears well-developed and well-nourished. No distress.  HENT:  Head: Normocephalic and atraumatic.  Neck: Neck supple.  Pulmonary/Chest: Effort normal. No respiratory distress.  Neurological: She is alert and oriented to person, place, and time.  Psychiatric: She has a normal mood and affect. Her behavior is normal.  Nursing note and vitals reviewed.   ED Course  Procedures (including critical care time) Labs Review Labs Reviewed - No data to display  Imaging Review No results found.   EKG Interpretation None      MDM   Final diagnoses:  Medication refill   Patient here for med refill, follow-up in approximately one week. Plan to refill medication. SPO2 minute at 81% on room air.  Order to recheck placed, acknowledged, never updated. Patient's pulse ox 98% room air with good waveform in room, denies shortness breath, chest pain, palpitations.  Meds given in ED:  Medications - No data to display  Discharge Medication List as of 10/06/2014  4:16 PM    START taking these medications   Details  !! lamoTRIgine (LAMICTAL) 25 MG tablet Take 1 tablet (25 mg total) by mouth 2  (two) times daily., Starting 10/06/2014, Until Discontinued, Print     !! - Potential duplicate medications found. Please discuss with provider.      Mellody Drown, PA-C 10/06/14 0109  Audree Camel, MD 10/07/14 [phone]

## 2014-10-07 NOTE — Telephone Encounter (Signed)
Please have patient  Take blood pressure readings twice a day for 7- 10 days  Then  Ov ( 30 minutes ) with   bp  Monitor and readings to assess  Past bp has been normal .

## 2014-10-07 NOTE — Telephone Encounter (Signed)
See other messages. Refill per ed where originally prescribed.Marland KitchenMarland KitchenMarland Kitchen

## 2014-10-07 NOTE — Telephone Encounter (Signed)
This is out of my area and  psych has declined to write until the visit .   I see that she has been seen back in the ed for the medication evaluation.  Sounds like she got a refill from the original prescribing entity until her visit .

## 2014-10-12 NOTE — Telephone Encounter (Signed)
Lm on vm to cb °

## 2014-10-13 ENCOUNTER — Telehealth: Payer: Self-pay | Admitting: Internal Medicine

## 2014-10-13 NOTE — Telephone Encounter (Signed)
FYI

## 2014-10-13 NOTE — Telephone Encounter (Signed)
Misty Dr Fabian Sharp wanted this patient to have a 30 minute appt  So I had to use a same day on 11/06/14 at 11:15  Is this ok .Marland Kitchen

## 2014-10-13 NOTE — Telephone Encounter (Signed)
That is fine  thanks

## 2014-10-13 NOTE — Telephone Encounter (Signed)
S/w pt she said her psychiatrists also want her to discuss chest pains,dizzyness when she stand and work,panis attachs and numbness in feet and hand sometimes. She said she will discuss these things at her Jan  2016 . I ask her if she would like to speak with [facility] said she will wait till her visit.

## 2014-10-13 NOTE — Telephone Encounter (Signed)
Noted  Will address  Lab Results  Component Value Date   WBC 10.3 08/16/2014   HGB 14.3 08/31/2014   HCT 42.0 08/31/2014   PLT 251 08/16/2014   GLUCOSE 100* 09/01/2014   CHOL 160 12/04/2013   TRIG 77.0 12/04/2013   HDL 52.50 12/04/2013   LDLDIRECT 146.3 01/26/2010   LDLCALC 92 12/04/2013   ALT 18 09/01/2014   AST 27 09/01/2014   NA 140 09/01/2014   K 4.5 09/01/2014   CL 102 09/01/2014   CREATININE 1.50* 09/01/2014   BUN 9 09/01/2014   CO2 28 09/01/2014   TSH 3.87 10/30/2012   INR 1.04 05/05/2011   HGBA1C 6.2 06/26/2014   MICROALBUR 0.6 01/26/2010    Lab Results  Component Value Date   HGBA1C 6.2 06/26/2014   HGBA1C 6.1 12/04/2013   HGBA1C 6.6* 06/03/2013   Lab Results  Component Value Date   MICROALBUR 0.6 01/26/2010   LDLCALC 92 12/04/2013   CREATININE 1.50* 09/01/2014  ekg last 10  15  Showed  Prolonged qt .  meds were changed  Will repeat at visit  May need cards  eval

## 2014-11-06 ENCOUNTER — Ambulatory Visit: Payer: Self-pay | Admitting: Internal Medicine

## 2014-11-23 ENCOUNTER — Telehealth: Payer: Self-pay | Admitting: Internal Medicine

## 2014-11-23 NOTE — Telephone Encounter (Signed)
Pt states [employer] will be faxing something  For her to get a new meter and supplies. Pt would like you aware.

## 2014-11-24 DIAGNOSIS — M545 Low back pain: Secondary | ICD-10-CM | POA: Diagnosis not present

## 2014-11-24 NOTE — Telephone Encounter (Signed)
Pt has some unexplained bruising and want to know if any of her meds will cause bruising/ pt will be bringng by handicap parking paperwork and hope you can have an answer when she comes by.

## 2014-11-26 ENCOUNTER — Telehealth: Payer: Self-pay

## 2014-11-26 MED ORDER — GLUCOSE BLOOD VI STRP: ORAL_STRIP | Status: DC

## 2014-11-26 MED ORDER — ACCU-CHEK FASTCLIX LANCET KIT: PACK | Status: DC

## 2014-11-26 MED ORDER — ACCU-CHEK NANO SMARTVIEW W/DEVICE KIT: PACK | Status: DC

## 2014-11-26 NOTE — Telephone Encounter (Signed)
I dont have an updated med list    That being said  However  Possible cymbalta ? tramadol  Ibuprofen and  Jonne Ply can definitely do this also  Can discuss this more at her visit in march

## 2014-11-26 NOTE — Telephone Encounter (Signed)
Rx request for:  Accu-Chek Nano Smartview Meter  Accu-chek Smartview Test Strips  Accu-Chek Fastclix Lancets  Pharm:  [employer]

## 2014-11-26 NOTE — Telephone Encounter (Signed)
Sent to the pharmacy by e-scribe. 

## 2014-11-27 DIAGNOSIS — F314 Bipolar disorder, current episode depressed, severe, without psychotic features: Secondary | ICD-10-CM | POA: Diagnosis not present

## 2014-11-27 NOTE — Telephone Encounter (Signed)
LMOM for the pt to return my call. 

## 2014-11-30 ENCOUNTER — Ambulatory Visit (INDEPENDENT_AMBULATORY_CARE_PROVIDER_SITE_OTHER)
Admission: RE | Admit: 2014-11-30 | Discharge: 2014-11-30 | Disposition: A | Discharge status: Home / Self Care | Payer: Commercial Managed Care - HMO | Source: Ambulatory Visit | Attending: Internal Medicine | Admitting: Internal Medicine

## 2014-11-30 ENCOUNTER — Ambulatory Visit (INDEPENDENT_AMBULATORY_CARE_PROVIDER_SITE_OTHER): Payer: Commercial Managed Care - HMO | Admitting: Internal Medicine

## 2014-11-30 ENCOUNTER — Encounter: Payer: Self-pay | Admitting: Internal Medicine

## 2014-11-30 VITALS — BP 120/62 | HR 65 | Temp 98.5°F | Ht 63.0 in | Wt 147.4 lb

## 2014-11-30 DIAGNOSIS — T148 Other injury of unspecified body region: Secondary | ICD-10-CM

## 2014-11-30 DIAGNOSIS — R296 Repeated falls: Secondary | ICD-10-CM | POA: Diagnosis not present

## 2014-11-30 DIAGNOSIS — R0789 Other chest pain: Secondary | ICD-10-CM

## 2014-11-30 DIAGNOSIS — R0602 Shortness of breath: Secondary | ICD-10-CM | POA: Diagnosis not present

## 2014-11-30 DIAGNOSIS — N289 Disorder of kidney and ureter, unspecified: Secondary | ICD-10-CM | POA: Diagnosis not present

## 2014-11-30 DIAGNOSIS — R072 Precordial pain: Secondary | ICD-10-CM | POA: Diagnosis not present

## 2014-11-30 DIAGNOSIS — T148XXA Other injury of unspecified body region, initial encounter: Secondary | ICD-10-CM

## 2014-11-30 DIAGNOSIS — I10 Essential (primary) hypertension: Secondary | ICD-10-CM

## 2014-11-30 DIAGNOSIS — R9431 Abnormal electrocardiogram [ECG] [EKG]: Secondary | ICD-10-CM

## 2014-11-30 DIAGNOSIS — E1121 Type 2 diabetes mellitus with diabetic nephropathy: Secondary | ICD-10-CM

## 2014-11-30 DIAGNOSIS — R42 Dizziness and giddiness: Secondary | ICD-10-CM | POA: Diagnosis not present

## 2014-11-30 DIAGNOSIS — F3110 Bipolar disorder, current episode manic without psychotic features, unspecified: Secondary | ICD-10-CM | POA: Diagnosis not present

## 2014-11-30 DIAGNOSIS — Z9181 History of falling: Secondary | ICD-10-CM

## 2014-11-30 DIAGNOSIS — E1129 Type 2 diabetes mellitus with other diabetic kidney complication: Secondary | ICD-10-CM

## 2014-11-30 NOTE — Telephone Encounter (Signed)
Pt notified.  Sent to scheduling to make a follow up of bp.
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
## 2014-11-30 NOTE — Patient Instructions (Addendum)
Will have to review record  But i need dr Jill Alexanders last record  .   Prazosin  Can cause dizziness when standing   suddnetly and there may be better oprinos for  blood pressure control.   Will arrange for you to see cardiology about the SOB and dizziness.  Will gert a chest x ray  And get help withe the falling   Problem from neurology .   Plan  To move up lab visits  From   March 11   Get a fasting lab appt  And i will put  In orders   For the tests .   Lab Results  Component Value Date   WBC 10.3 08/16/2014   HGB 14.3 08/31/2014   HCT 42.0 08/31/2014   PLT 251 08/16/2014   GLUCOSE 100* 09/01/2014   CHOL 160 12/04/2013   TRIG 77.0 12/04/2013   HDL 52.50 12/04/2013   LDLDIRECT 146.3 01/26/2010   LDLCALC 92 12/04/2013   ALT 18 09/01/2014   AST 27 09/01/2014   NA 140 09/01/2014   K 4.5 09/01/2014   CL 102 09/01/2014   CREATININE 1.50* 09/01/2014   BUN 9 09/01/2014   CO2 28 09/01/2014   TSH 3.87 10/30/2012   INR 1.04 05/05/2011   HGBA1C 6.2 06/26/2014   MICROALBUR 0.6 01/26/2010

## 2014-12-02 ENCOUNTER — Other Ambulatory Visit (INDEPENDENT_AMBULATORY_CARE_PROVIDER_SITE_OTHER): Payer: Commercial Managed Care - HMO

## 2014-12-02 DIAGNOSIS — E1121 Type 2 diabetes mellitus with diabetic nephropathy: Secondary | ICD-10-CM

## 2014-12-02 DIAGNOSIS — N289 Disorder of kidney and ureter, unspecified: Secondary | ICD-10-CM

## 2014-12-02 DIAGNOSIS — R296 Repeated falls: Secondary | ICD-10-CM

## 2014-12-02 DIAGNOSIS — R42 Dizziness and giddiness: Secondary | ICD-10-CM

## 2014-12-02 DIAGNOSIS — Z9181 History of falling: Secondary | ICD-10-CM

## 2014-12-02 DIAGNOSIS — R0602 Shortness of breath: Secondary | ICD-10-CM | POA: Diagnosis not present

## 2014-12-02 DIAGNOSIS — R0789 Other chest pain: Secondary | ICD-10-CM

## 2014-12-02 DIAGNOSIS — R9431 Abnormal electrocardiogram [ECG] [EKG]: Secondary | ICD-10-CM

## 2014-12-02 DIAGNOSIS — F3110 Bipolar disorder, current episode manic without psychotic features, unspecified: Secondary | ICD-10-CM

## 2014-12-02 DIAGNOSIS — E1129 Type 2 diabetes mellitus with other diabetic kidney complication: Secondary | ICD-10-CM

## 2014-12-02 DIAGNOSIS — T148XXA Other injury of unspecified body region, initial encounter: Secondary | ICD-10-CM

## 2014-12-02 LAB — HEPATIC FUNCTION PANEL
ALT: 12 U/L (ref 0–35)
AST: 24 U/L (ref 0–37)
Albumin: 4.1 g/dL (ref 3.5–5.2)
Alkaline Phosphatase: 65 U/L (ref 39–117)
Bilirubin, Direct: 0.1 mg/dL (ref 0.0–0.3)
Total Bilirubin: 0.3 mg/dL (ref 0.2–1.2)
Total Protein: 7.2 g/dL (ref 6.0–8.3)

## 2014-12-02 LAB — CBC WITH DIFFERENTIAL/PLATELET
Basophils Absolute: 0.1 10*3/uL (ref 0.0–0.1)
Basophils Relative: 1 % (ref 0.0–3.0)
Eosinophils Absolute: 0.3 10*3/uL (ref 0.0–0.7)
Eosinophils Relative: 5.2 % — ABNORMAL HIGH (ref 0.0–5.0)
HCT: 38.8 % (ref 36.0–46.0)
Hemoglobin: 12.8 g/dL (ref 12.0–15.0)
Lymphocytes Relative: 50.1 % — ABNORMAL HIGH (ref 12.0–46.0)
Lymphs Abs: 3.2 10*3/uL (ref 0.7–4.0)
MCHC: 33 g/dL (ref 30.0–36.0)
MCV: 89.4 fl (ref 78.0–100.0)
Monocytes Absolute: 0.4 10*3/uL (ref 0.1–1.0)
Monocytes Relative: 6.9 % (ref 3.0–12.0)
Neutro Abs: 2.3 10*3/uL (ref 1.4–7.7)
Neutrophils Relative %: 36.8 % — ABNORMAL LOW (ref 43.0–77.0)
Platelets: 326 10*3/uL (ref 150.0–400.0)
RBC: 4.34 Mil/uL (ref 3.87–5.11)
RDW: 15.2 % (ref 11.5–15.5)
WBC: 6.4 10*3/uL (ref 4.0–10.5)

## 2014-12-02 LAB — LIPID PANEL
Cholesterol: 140 mg/dL (ref 0–200)
HDL: 54.4 mg/dL (ref >39.00)
LDL Cholesterol: 69 mg/dL (ref 0–99)
NonHDL: 85.6
Total CHOL/HDL Ratio: 3
Triglycerides: 84 mg/dL (ref 0.0–149.0)
VLDL: 16.8 mg/dL (ref 0.0–40.0)

## 2014-12-02 LAB — BASIC METABOLIC PANEL
BUN: 13 mg/dL (ref 6–23)
CO2: 30 mEq/L (ref 19–32)
Calcium: 9.5 mg/dL (ref 8.4–10.5)
Chloride: 103 mEq/L (ref 96–112)
Creatinine, Ser: 1.54 mg/dL — ABNORMAL HIGH (ref 0.40–1.20)
GFR: 43.81 mL/min — ABNORMAL LOW (ref >60.00)
Glucose, Bld: 109 mg/dL — ABNORMAL HIGH (ref 70–99)
Potassium: 4.2 mEq/L (ref 3.5–5.1)
Sodium: 140 mEq/L (ref 135–145)

## 2014-12-02 LAB — BRAIN NATRIURETIC PEPTIDE: Pro B Natriuretic peptide (BNP): 5 pg/mL (ref 0.0–100.0)

## 2014-12-02 LAB — HEMOGLOBIN A1C: Hgb A1c MFr Bld: 6.3 % (ref 4.6–6.5)

## 2014-12-02 LAB — TSH: TSH: 3.61 u[IU]/mL (ref 0.35–4.50)

## 2014-12-07 ENCOUNTER — Ambulatory Visit: Payer: Commercial Managed Care - HMO | Admitting: Internal Medicine

## 2014-12-08 ENCOUNTER — Encounter: Payer: Self-pay | Admitting: Internal Medicine

## 2014-12-08 ENCOUNTER — Ambulatory Visit (INDEPENDENT_AMBULATORY_CARE_PROVIDER_SITE_OTHER): Payer: Commercial Managed Care - HMO | Admitting: Internal Medicine

## 2014-12-08 VITALS — BP 150/84 | Temp 98.4°F | Ht 62.5 in | Wt 145.8 lb

## 2014-12-08 DIAGNOSIS — I10 Essential (primary) hypertension: Secondary | ICD-10-CM

## 2014-12-08 DIAGNOSIS — N289 Disorder of kidney and ureter, unspecified: Secondary | ICD-10-CM | POA: Diagnosis not present

## 2014-12-08 DIAGNOSIS — E1121 Type 2 diabetes mellitus with diabetic nephropathy: Secondary | ICD-10-CM

## 2014-12-08 DIAGNOSIS — E1129 Type 2 diabetes mellitus with other diabetic kidney complication: Secondary | ICD-10-CM

## 2014-12-08 DIAGNOSIS — Z79899 Other long term (current) drug therapy: Secondary | ICD-10-CM

## 2014-12-08 MED ORDER — LOSARTAN POTASSIUM 25 MG PO TABS: 25.0000 mg | ORAL_TABLET | Freq: Every day | ORAL | Status: DC
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
## 2014-12-08 NOTE — Patient Instructions (Addendum)
Keep the appt with the  Cardiology  As we discussed. Since your blood pressure is up and down .  Want to get off of the prazosin   And then change medication to 25 mg of cozaar  Once a day .  For  Smoother blood pressure control.   Recheck bmp( kidney function)  in 1 month and fu with me .

## 2014-12-09 ENCOUNTER — Telehealth: Payer: Self-pay | Admitting: Internal Medicine

## 2014-12-09 NOTE — Telephone Encounter (Signed)
Note received from Dr. Eliane Decree office and placed on [employer].

## 2014-12-09 NOTE — Telephone Encounter (Signed)
Per fax received from [facility]:  Patient called stating she saw Dr. Allena Katz on 09/14/14.  Dr. Fabian Sharp need to know the date.

## 2014-12-11 NOTE — Telephone Encounter (Signed)
Reviewed note   Ok to begin ace arb if needed.per note nov 15  Also advised to check "magnesium  phosphorous  Vit d level at next blood visit" Please add these labs on to  The bmp to be done in a month ( see last note)

## 2014-12-16 ENCOUNTER — Other Ambulatory Visit: Payer: Self-pay | Admitting: Family Medicine

## 2014-12-16 DIAGNOSIS — N289 Disorder of kidney and ureter, unspecified: Secondary | ICD-10-CM

## 2014-12-16 NOTE — Telephone Encounter (Signed)
Orders placed in the system.  No further action needed.

## 2014-12-18 ENCOUNTER — Telehealth: Payer: Self-pay | Admitting: Internal Medicine

## 2014-12-18 NOTE — Telephone Encounter (Signed)
ADVICE RECORD [facility]  Patient Name: Desiree [name]  DOB: [birth year]    Initial Comment Caller states was on a prescription that increased her appetite. Wants to know what that is so that she can get some to gain some weight back. Losing to much weight   Nurse Assessment      Guidelines    Guideline Title Affirmed Question Affirmed Notes       Final Disposition User   FINAL ATTEMPT MADE - no message left Harlon Flor, RN, Darl Pikes

## 2014-12-21 NOTE — Telephone Encounter (Signed)
I dont know what medication that would be ...  Bring [place] more information about name of med and who prescribed it   To her visit in [place]

## 2014-12-22 NOTE — Telephone Encounter (Signed)
Spoke to the pt.  Informed her that this would be discussed further at next appt.  She will try to remember the name of the medication.

## 2014-12-22 NOTE — Telephone Encounter (Signed)
LMOM for the pt to return my call. 

## 2014-12-24 ENCOUNTER — Encounter: Payer: Self-pay | Admitting: Cardiology

## 2014-12-24 ENCOUNTER — Encounter: Payer: Self-pay | Admitting: *Deleted

## 2014-12-24 ENCOUNTER — Telehealth: Payer: Self-pay | Admitting: Cardiology

## 2014-12-24 ENCOUNTER — Ambulatory Visit (INDEPENDENT_AMBULATORY_CARE_PROVIDER_SITE_OTHER): Payer: Commercial Managed Care - HMO | Admitting: Cardiology

## 2014-12-24 VITALS — BP 122/62 | HR 91 | Ht 62.5 in | Wt 149.6 lb

## 2014-12-24 DIAGNOSIS — R0789 Other chest pain: Secondary | ICD-10-CM

## 2014-12-24 DIAGNOSIS — F3131 Bipolar disorder, current episode depressed, mild: Secondary | ICD-10-CM

## 2014-12-24 DIAGNOSIS — I10 Essential (primary) hypertension: Secondary | ICD-10-CM

## 2014-12-24 DIAGNOSIS — R739 Hyperglycemia, unspecified: Secondary | ICD-10-CM

## 2014-12-24 DIAGNOSIS — R079 Chest pain, unspecified: Secondary | ICD-10-CM

## 2014-12-24 NOTE — Patient Instructions (Signed)
Your physician has requested that you have a lexiscan myoview. For further information please visit [email or web address]. Please follow instruction sheet, as given.  Your physician recommends that you continue on your current medications as directed. Please refer to the Current Medication list given to you today.  Follow up as needed  

## 2014-12-24 NOTE — Telephone Encounter (Signed)
NEw Message  Pt wanted a written overview of what happened at today's consult. Please call back and dsicuss,.
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
## 2014-12-29 ENCOUNTER — Other Ambulatory Visit: Payer: Commercial Managed Care - HMO

## 2014-12-31 ENCOUNTER — Encounter: Payer: Self-pay | Admitting: Cardiology

## 2015-01-01 ENCOUNTER — Ambulatory Visit (INDEPENDENT_AMBULATORY_CARE_PROVIDER_SITE_OTHER): Payer: Commercial Managed Care - HMO | Admitting: Internal Medicine

## 2015-01-01 ENCOUNTER — Encounter: Payer: Self-pay | Admitting: Internal Medicine

## 2015-01-01 VITALS — BP 114/72 | Temp 98.9°F | Ht 63.25 in | Wt 147.0 lb

## 2015-01-01 DIAGNOSIS — Z114 Encounter for screening for human immunodeficiency virus [HIV]: Secondary | ICD-10-CM

## 2015-01-01 DIAGNOSIS — Z0000 Encounter for general adult medical examination without abnormal findings: Secondary | ICD-10-CM

## 2015-01-01 DIAGNOSIS — Z1159 Encounter for screening for other viral diseases: Secondary | ICD-10-CM | POA: Diagnosis not present

## 2015-01-01 DIAGNOSIS — M159 Polyosteoarthritis, unspecified: Secondary | ICD-10-CM | POA: Insufficient documentation

## 2015-01-01 DIAGNOSIS — M8949 Other hypertrophic osteoarthropathy, multiple sites: Secondary | ICD-10-CM

## 2015-01-01 DIAGNOSIS — E1129 Type 2 diabetes mellitus with other diabetic kidney complication: Secondary | ICD-10-CM

## 2015-01-01 DIAGNOSIS — I10 Essential (primary) hypertension: Secondary | ICD-10-CM | POA: Diagnosis not present

## 2015-01-01 DIAGNOSIS — N289 Disorder of kidney and ureter, unspecified: Secondary | ICD-10-CM

## 2015-01-01 DIAGNOSIS — Z79899 Other long term (current) drug therapy: Secondary | ICD-10-CM

## 2015-01-01 DIAGNOSIS — M150 Primary generalized (osteo)arthritis: Secondary | ICD-10-CM

## 2015-01-01 LAB — BASIC METABOLIC PANEL
BUN: 13 mg/dL (ref 6–23)
CO2: 31 mEq/L (ref 19–32)
Calcium: 9.7 mg/dL (ref 8.4–10.5)
Chloride: 102 mEq/L (ref 96–112)
Creatinine, Ser: 1.49 mg/dL — ABNORMAL HIGH (ref 0.40–1.20)
GFR: 45.49 mL/min — ABNORMAL LOW (ref >60.00)
Glucose, Bld: 87 mg/dL (ref 70–99)
Potassium: 4 mEq/L (ref 3.5–5.1)
Sodium: 139 mEq/L (ref 135–145)
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
## 2015-01-01 NOTE — Patient Instructions (Signed)
Do mammogram  Continue meds bp ,monitoriing  Will notify you  of labs when available.  Fu 2-3 months . Get stress test. Check into shingles vaccine ( Zostavax) reimbursement or cost to you  and can return at any time if call ahead for injection. Continue lifestyle intervention healthy eating and exercise .

## 2015-01-02 LAB — HEPATITIS C ANTIBODY: HCV Ab: NEGATIVE

## 2015-01-02 LAB — HIV ANTIBODY (ROUTINE TESTING W REFLEX): HIV 1&2 Ab, 4th Generation: NONREACTIVE

## 2015-01-04 ENCOUNTER — Other Ambulatory Visit: Payer: Self-pay

## 2015-01-05 ENCOUNTER — Ambulatory Visit (HOSPITAL_COMMUNITY): Payer: Commercial Managed Care - HMO | Attending: Cardiovascular Disease | Admitting: Radiology

## 2015-01-05 DIAGNOSIS — R079 Chest pain, unspecified: Secondary | ICD-10-CM

## 2015-01-05 DIAGNOSIS — I10 Essential (primary) hypertension: Secondary | ICD-10-CM | POA: Insufficient documentation

## 2015-01-05 DIAGNOSIS — R0789 Other chest pain: Secondary | ICD-10-CM | POA: Diagnosis not present

## 2015-01-05 MED ORDER — REGADENOSON 0.4 MG/5ML IV SOLN
0.4000 mg | Freq: Once | INTRAVENOUS | Status: AC
  Administered 2015-01-05: 0.4 mg via INTRAVENOUS

## 2015-01-05 MED ORDER — TECHNETIUM TC 99M SESTAMIBI GENERIC - CARDIOLITE
11.0000 | Freq: Once | INTRAVENOUS | Status: AC | PRN
  Administered 2015-01-05: 11 via INTRAVENOUS

## 2015-01-05 MED ORDER — TECHNETIUM TC 99M SESTAMIBI GENERIC - CARDIOLITE
33.0000 | Freq: Once | INTRAVENOUS | Status: AC | PRN
  Administered 2015-01-05: 33 via INTRAVENOUS

## 2015-01-05 NOTE — Progress Notes (Signed)
[facility] SITE 3 NUCLEAR MED [address] [place], [place] [address] [phone]    Cardiology Nuclear Med Study  Desiree [name] is a 64 y.o. female     MRN : [id number]     DOB: 04/03/1951  Procedure Date: 01/05/2015  Nuclear Med Background Indication for Stress Test:  Evaluation for Ischemia History:  MPI 2012 (normal) EF 75%, COPD Cardiac Risk Factors: Family History - CAD, History of Smoking, Hypertension, Lipids and NIDDM  Symptoms:  Chest Pain, DOE and SOB   Nuclear Pre-Procedure Caffeine/Decaff Intake:  None> 12 hrs NPO After: 6:30pm   Lungs:  clear O2 Sat: 93% on room air. IV 0.9% NS with Angio Cath:  22g  IV Site: R Antecubital x 1, tolerated well IV Started by:  Irean Hong, RN  Chest Size (in):  36 Cup Size: B  Height: 5' 2.5" (1.588 m)  Weight:  145 lb (65.772 kg)  BMI:  Body mass index is 26.08 kg/(m^2). Tech Comments:  Patient took Losartan this am. Irean Hong, RN.    Nuclear Med Study 1 or 2 day study: 1 day  Stress Test Type:  Eugenie Birks  Reading MD: N/A  Order Authorizing Provider:  Cassell Clement, MD  Resting Radionuclide: Technetium 72m Sestamibi  Resting Radionuclide Dose: 11.0 mCi   Stress Radionuclide:  Technetium 63m Sestamibi  Stress Radionuclide Dose: 33.0 mCi           Stress Protocol Rest HR: 82 Stress HR: 110  Rest BP: 116/72 Stress BP: 108/59  Exercise Time (min): n/a METS: n/a   Predicted Max HR: 158 bpm % Max HR: 69.62 bpm Rate Pressure Product: 82500   Dose of Adenosine (mg):  n/a Dose of Lexiscan: 0.4 mg  Dose of Atropine (mg): n/a Dose of Dobutamine: n/a mcg/kg/min (at max HR)  Stress Test Technologist: Nelson Chimes, BS-ES  Nuclear Technologist:  Kerby Nora, CNMT     Rest Procedure:  Myocardial perfusion imaging was performed at rest 45 minutes following the intravenous administration of Technetium 67m Sestamibi. Rest ECG: NSR - Normal EKG  Stress Procedure:  The patient received IV Lexiscan 0.4 mg over  15-seconds.  Technetium 33m Sestamibi injected at 30-seconds.  Quantitative spect images were obtained after a 45 minute delay.  During the infusion of Lexiscan the patient complained of cough, SOB, chest tightness, lightheadedness and a headache.  These symptoms began to subside in recovery.  Stress ECG: No significant change from baseline ECG  QPS Raw Data Images:  There is interference from nuclear activity from structures below the diaphragm. This does not affect the ability to read the study. Stress Images:  There is decreased uptake in the lateral wall. Rest Images:  There is decreased uptake in the lateral wall. Subtraction (SDS):  3 Transient Ischemic Dilatation (Normal <1.22):  1.01 Lung/Heart Ratio (Normal <0.45):  0.24  Quantitative Gated Spect Images QGS EDV:  56 ml QGS ESV:  17 ml  Impression Exercise Capacity:  Lexiscan with no exercise. BP Response:  Normal blood pressure response. Clinical Symptoms:  cough, dyspnea, chest tightness, headache, lightheaded ECG Impression:  No significant ST segment change with adenosine. Comparison with Prior Nuclear Study: Previous NST in 2012 was normal  Overall Impression:  Low risk stress nuclear study with fixed inferolateral defect, which is likely attenuation artifact -adjacent hot bowel is noted. No significant ischemia.  LV Ejection Fraction: 70%.  LV Wall Motion:  Normal Wall Motion   Chrystie Nose, MD, [facility] Board Certified in  Nuclear Cardiology Attending Cardiologist [facility]

## 2015-01-06 ENCOUNTER — Telehealth: Payer: Self-pay | Admitting: Cardiology

## 2015-01-06 NOTE — Telephone Encounter (Signed)
Walk in pt form " pt needs summary of visit" gave to [facility]

## 2015-01-11 ENCOUNTER — Ambulatory Visit: Payer: Self-pay | Admitting: Internal Medicine

## 2015-01-20 ENCOUNTER — Telehealth: Payer: Self-pay | Admitting: Internal Medicine

## 2015-01-20 DIAGNOSIS — Z01419 Encounter for gynecological examination (general) (routine) without abnormal findings: Secondary | ICD-10-CM

## 2015-01-20 NOTE — Telephone Encounter (Signed)
Pt called to say she need a referral to [facility] Dr Teodora Medici

## 2015-01-20 NOTE — Telephone Encounter (Signed)
Should be ok   Not sure whats in it    Just make sure no  potassium   And just vitamins

## 2015-01-20 NOTE — Telephone Encounter (Signed)
Spoke to the pt.  Advised that [facility] thought it would be okay to take Nature's Hair, Skin and Nail as long as it does not have potassium.  Pt will check and verify before taking.

## 2015-01-20 NOTE — Telephone Encounter (Signed)
Patient would like to know if she can take Nature's Hair, Skin, and Nails Vitamin?

## 2015-01-20 NOTE — Telephone Encounter (Signed)
Order placed in the system. 

## 2015-01-21 ENCOUNTER — Telehealth: Payer: Self-pay | Admitting: Neurology

## 2015-01-21 NOTE — Telephone Encounter (Signed)
Pt canecled her Np appt for 01/25/15 w/ Dr. Everlena Cooper through the automated reminder call system. Called/LMOM to see if pt wanted to r/s. Dr. [name]/referring provider was notified.

## 2015-01-25 ENCOUNTER — Ambulatory Visit: Payer: Self-pay | Admitting: Neurology

## 2015-02-02 DIAGNOSIS — F314 Bipolar disorder, current episode depressed, severe, without psychotic features: Secondary | ICD-10-CM | POA: Diagnosis not present

## 2015-02-04 DIAGNOSIS — Z1231 Encounter for screening mammogram for malignant neoplasm of breast: Secondary | ICD-10-CM | POA: Diagnosis not present

## 2015-02-10 ENCOUNTER — Other Ambulatory Visit: Payer: Self-pay | Admitting: Internal Medicine

## 2015-02-10 NOTE — Telephone Encounter (Signed)
Sent to the pharmacy by e-scribe. 

## 2015-02-17 ENCOUNTER — Other Ambulatory Visit: Payer: Self-pay | Admitting: Internal Medicine

## 2015-02-18 ENCOUNTER — Telehealth: Payer: Self-pay | Admitting: Internal Medicine

## 2015-02-18 NOTE — Telephone Encounter (Signed)
Pt notified that the prescription was received on the 20th and is available for pick up.

## 2015-02-18 NOTE — Telephone Encounter (Signed)
Pt request refill of the following: CRESTOR 10 MG tablet  Pt said she contacted pharmacy and they don't have the req we sent 02/10/15 can this be resent please    Phamacy: [facility]

## 2015-02-18 NOTE — Telephone Encounter (Signed)
Denied.  Filled on 02/10/15

## 2015-03-03 DIAGNOSIS — R809 Proteinuria, unspecified: Secondary | ICD-10-CM | POA: Diagnosis not present

## 2015-03-03 DIAGNOSIS — N390 Urinary tract infection, site not specified: Secondary | ICD-10-CM | POA: Diagnosis not present

## 2015-03-03 DIAGNOSIS — N760 Acute vaginitis: Secondary | ICD-10-CM | POA: Diagnosis not present

## 2015-03-06 ENCOUNTER — Other Ambulatory Visit: Payer: Self-pay | Admitting: Internal Medicine

## 2015-03-10 ENCOUNTER — Telehealth: Payer: Self-pay | Admitting: Internal Medicine

## 2015-03-10 NOTE — Telephone Encounter (Signed)
Pt would like to request a less expensive option for her Crestor 10 mg one x day [facility] Desiree [name]

## 2015-03-10 NOTE — Telephone Encounter (Signed)
Please advise 

## 2015-03-11 MED ORDER — ATORVASTATIN CALCIUM 20 MG PO TABS: 20.0000 mg | ORAL_TABLET | Freq: Every day | ORAL | Status: DC

## 2015-03-11 NOTE — Telephone Encounter (Signed)
Pt notified that she has been switched to Lipitor.  She asked for a 90 day supply.  Sent to the pharmacy by e-scribe.  Notified to call back if she experiences any side effects.

## 2015-03-11 NOTE — Telephone Encounter (Signed)
If she can take lipitor  Can switch to atorvastatin 20 mg 1 po qd disp 30 refill x 3

## 2015-03-17 ENCOUNTER — Telehealth: Payer: Self-pay | Admitting: Internal Medicine

## 2015-03-17 NOTE — Telephone Encounter (Signed)
Pt would like to know if dr Fabian Sharp would like for her to continue taking nasonex if so please call into [facility]

## 2015-03-19 NOTE — Telephone Encounter (Signed)
Letter done and mailed.

## 2015-03-20 NOTE — Telephone Encounter (Signed)
Ok to refill for 6 times if helping   Sinus nasal drainage or allergies sx ,

## 2015-03-23 MED ORDER — MOMETASONE FUROATE 50 MCG/ACT NA SUSP: 2.0000 | Freq: Every day | NASAL | Status: DC

## 2015-03-23 NOTE — Telephone Encounter (Signed)
Spoke to the pt.  She stated the Nasonex is helping her nasal drainage.  She is requesting a refill.  Sent to the pharmacy by e-scribe.

## 2015-03-25 ENCOUNTER — Telehealth: Payer: Self-pay

## 2015-03-25 NOTE — Telephone Encounter (Signed)
Called and spoke with pt and pt states she had her mammogram at [facility].

## 2015-04-01 ENCOUNTER — Other Ambulatory Visit (INDEPENDENT_AMBULATORY_CARE_PROVIDER_SITE_OTHER): Payer: Commercial Managed Care - HMO

## 2015-04-01 DIAGNOSIS — N289 Disorder of kidney and ureter, unspecified: Secondary | ICD-10-CM

## 2015-04-01 LAB — MAGNESIUM: Magnesium: 1.9 mg/dL (ref 1.5–2.5)

## 2015-04-01 LAB — VITAMIN D 25 HYDROXY (VIT D DEFICIENCY, FRACTURES): VITD: 53.49 ng/mL (ref 30.00–100.00)

## 2015-04-01 LAB — PHOSPHORUS: Phosphorus: 3.4 mg/dL (ref 2.3–4.6)

## 2015-04-05 DIAGNOSIS — F314 Bipolar disorder, current episode depressed, severe, without psychotic features: Secondary | ICD-10-CM | POA: Diagnosis not present

## 2015-04-08 ENCOUNTER — Ambulatory Visit: Payer: Self-pay | Admitting: Internal Medicine

## 2015-04-19 ENCOUNTER — Ambulatory Visit (INDEPENDENT_AMBULATORY_CARE_PROVIDER_SITE_OTHER): Payer: Commercial Managed Care - HMO | Admitting: Internal Medicine

## 2015-04-19 ENCOUNTER — Encounter: Payer: Self-pay | Admitting: Internal Medicine

## 2015-04-19 VITALS — BP 130/76 | Temp 98.5°F | Ht 62.5 in | Wt 153.0 lb

## 2015-04-19 DIAGNOSIS — Z23 Encounter for immunization: Secondary | ICD-10-CM | POA: Diagnosis not present

## 2015-04-19 DIAGNOSIS — E1121 Type 2 diabetes mellitus with diabetic nephropathy: Secondary | ICD-10-CM

## 2015-04-19 DIAGNOSIS — N289 Disorder of kidney and ureter, unspecified: Secondary | ICD-10-CM

## 2015-04-19 DIAGNOSIS — R233 Spontaneous ecchymoses: Secondary | ICD-10-CM

## 2015-04-19 DIAGNOSIS — E1129 Type 2 diabetes mellitus with other diabetic kidney complication: Secondary | ICD-10-CM

## 2015-04-19 DIAGNOSIS — M549 Dorsalgia, unspecified: Secondary | ICD-10-CM

## 2015-04-19 DIAGNOSIS — E785 Hyperlipidemia, unspecified: Secondary | ICD-10-CM

## 2015-04-19 DIAGNOSIS — I10 Essential (primary) hypertension: Secondary | ICD-10-CM | POA: Diagnosis not present

## 2015-04-19 DIAGNOSIS — R238 Other skin changes: Secondary | ICD-10-CM
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
## 2015-04-19 NOTE — Patient Instructions (Signed)
Lab in august/ September .   Lipid hga1c ,  cbcdiff , bmp ...    If ok then  5-6  months    OV  Med check   get diabetes eye check .

## 2015-05-24 ENCOUNTER — Other Ambulatory Visit: Payer: Self-pay | Admitting: Family Medicine

## 2015-05-24 MED ORDER — LOSARTAN POTASSIUM 25 MG PO TABS
25.0000 mg | ORAL_TABLET | Freq: Every day | ORAL | Status: DC
Start: 2015-05-24 — End: 2015-10-06

## 2015-05-24 MED ORDER — ATORVASTATIN CALCIUM 20 MG PO TABS: 20.0000 mg | ORAL_TABLET | Freq: Every day | ORAL | Status: DC

## 2015-05-27 ENCOUNTER — Other Ambulatory Visit: Payer: Self-pay | Admitting: *Deleted

## 2015-05-27 NOTE — Telephone Encounter (Signed)
Sent to [facility] on 05/24/2015.  This request is a duplicate.

## 2015-06-14 ENCOUNTER — Telehealth: Payer: Self-pay | Admitting: Internal Medicine

## 2015-06-14 NOTE — Telephone Encounter (Signed)
Spoke to the pt.  She does not know why she is suppose to see the heart doctor.  She will call me when she finds out.  I will also try to call Dr. Dierdre Forth office during normal business hours.

## 2015-06-14 NOTE — Telephone Encounter (Signed)
Pt call to say she need a referral to see a heart doctor.  Pt said Dr Shawnee Knapp is requesting that she see a heart doctor

## 2015-06-15 NOTE — Telephone Encounter (Signed)
Anne Fu called back stating patient does not need to see cardiology. He is requesting patient receive an EKG with interpretation due to patient's history of long QT Interval. Please advise if want to perform under nurse visit or different appointment and will be happy to call patient.

## 2015-06-15 NOTE — Telephone Encounter (Signed)
Pt has been sch 06-21-15

## 2015-06-15 NOTE — Telephone Encounter (Signed)
Called and spoke with Waynetta Sandy at [facility] office, she will inquire about cardiology referral and call office back. Awaiting call.

## 2015-06-15 NOTE — Telephone Encounter (Signed)
Please scheduled an office visit with this patient.  WP will do EKG.

## 2015-06-18 ENCOUNTER — Other Ambulatory Visit: Payer: Self-pay

## 2015-06-21 ENCOUNTER — Ambulatory Visit: Payer: Self-pay | Admitting: Internal Medicine

## 2015-06-23 ENCOUNTER — Encounter: Payer: Self-pay | Admitting: Internal Medicine

## 2015-06-23 ENCOUNTER — Ambulatory Visit (INDEPENDENT_AMBULATORY_CARE_PROVIDER_SITE_OTHER): Payer: Commercial Managed Care - HMO | Admitting: Internal Medicine

## 2015-06-23 VITALS — BP 126/66 | Temp 98.7°F | Ht 62.5 in | Wt 150.7 lb

## 2015-06-23 DIAGNOSIS — E1121 Type 2 diabetes mellitus with diabetic nephropathy: Secondary | ICD-10-CM

## 2015-06-23 DIAGNOSIS — Z23 Encounter for immunization: Secondary | ICD-10-CM

## 2015-06-23 DIAGNOSIS — Z79899 Other long term (current) drug therapy: Secondary | ICD-10-CM

## 2015-06-23 DIAGNOSIS — I10 Essential (primary) hypertension: Secondary | ICD-10-CM

## 2015-06-23 DIAGNOSIS — E1129 Type 2 diabetes mellitus with other diabetic kidney complication: Secondary | ICD-10-CM

## 2015-06-23 DIAGNOSIS — N289 Disorder of kidney and ureter, unspecified: Secondary | ICD-10-CM

## 2015-06-23 DIAGNOSIS — E785 Hyperlipidemia, unspecified: Secondary | ICD-10-CM | POA: Diagnosis not present

## 2015-06-23 DIAGNOSIS — F329 Major depressive disorder, single episode, unspecified: Secondary | ICD-10-CM | POA: Diagnosis not present

## 2015-06-23 DIAGNOSIS — F32A Depression, unspecified: Secondary | ICD-10-CM

## 2015-06-23 LAB — CBC WITH DIFFERENTIAL/PLATELET
Basophils Absolute: 0.1 10*3/uL (ref 0.0–0.1)
Basophils Relative: 0.8 % (ref 0.0–3.0)
Eosinophils Absolute: 0.4 10*3/uL (ref 0.0–0.7)
Eosinophils Relative: 6 % — ABNORMAL HIGH (ref 0.0–5.0)
HCT: 38 % (ref 36.0–46.0)
Hemoglobin: 12.6 g/dL (ref 12.0–15.0)
Lymphocytes Relative: 47.1 % — ABNORMAL HIGH (ref 12.0–46.0)
Lymphs Abs: 2.8 10*3/uL (ref 0.7–4.0)
MCHC: 33.1 g/dL (ref 30.0–36.0)
MCV: 90.7 fl (ref 78.0–100.0)
Monocytes Absolute: 0.5 10*3/uL (ref 0.1–1.0)
Monocytes Relative: 7.6 % (ref 3.0–12.0)
Neutro Abs: 2.3 10*3/uL (ref 1.4–7.7)
Neutrophils Relative %: 38.5 % — ABNORMAL LOW (ref 43.0–77.0)
Platelets: 325 10*3/uL (ref 150.0–400.0)
RBC: 4.19 Mil/uL (ref 3.87–5.11)
RDW: 14.8 % (ref 11.5–15.5)
WBC: 6 10*3/uL (ref 4.0–10.5)

## 2015-06-23 LAB — BASIC METABOLIC PANEL
BUN: 14 mg/dL (ref 6–23)
CO2: 28 mEq/L (ref 19–32)
Calcium: 10 mg/dL (ref 8.4–10.5)
Chloride: 104 mEq/L (ref 96–112)
Creatinine, Ser: 1.38 mg/dL — ABNORMAL HIGH (ref 0.40–1.20)
GFR: 49.63 mL/min — ABNORMAL LOW (ref >60.00)
Glucose, Bld: 83 mg/dL (ref 70–99)
Potassium: 4.3 mEq/L (ref 3.5–5.1)
Sodium: 140 mEq/L (ref 135–145)

## 2015-06-23 LAB — LIPID PANEL
Cholesterol: 144 mg/dL (ref 0–200)
HDL: 55.1 mg/dL (ref >39.00)
LDL Cholesterol: 74 mg/dL (ref 0–99)
NonHDL: 88.54
Total CHOL/HDL Ratio: 3
Triglycerides: 71 mg/dL (ref 0.0–149.0)
VLDL: 14.2 mg/dL (ref 0.0–40.0)

## 2015-06-23 LAB — HEMOGLOBIN A1C: Hgb A1c MFr Bld: 6.2 % (ref 4.6–6.5)

## 2015-06-23 NOTE — Patient Instructions (Addendum)
Will notify you  of labs when available. Weill send copy of ekg to your  Psych office .  Can cancel the lab appt in October if all ok  but keep the OV appt.
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
## 2015-06-24 ENCOUNTER — Other Ambulatory Visit: Payer: Self-pay | Admitting: Family Medicine

## 2015-06-24 ENCOUNTER — Telehealth: Payer: Self-pay | Admitting: Internal Medicine

## 2015-06-24 NOTE — Telephone Encounter (Signed)
Pt called to report that she is no longer taking crestor and would like it removed from her medication list. Removed Crestor while on the phone with the pt.

## 2015-06-24 NOTE — Telephone Encounter (Signed)
Pt call and would like a call abck she said she questions about something on her discharge summary

## 2015-07-08 DIAGNOSIS — F314 Bipolar disorder, current episode depressed, severe, without psychotic features: Secondary | ICD-10-CM | POA: Diagnosis not present

## 2015-07-12 ENCOUNTER — Telehealth: Payer: Self-pay | Admitting: *Deleted

## 2015-07-12 NOTE — Telephone Encounter (Signed)
Home care advise given. 

## 2015-07-12 NOTE — Telephone Encounter (Signed)
PLEASE NOTE: All timestamps contained within this report are represented as [place] Standard Time. CONFIDENTIALTY NOTICE: This fax transmission is intended only for the addressee. It contains information that is legally privileged, confidential or otherwise protected from use or disclosure. If you are not the intended recipient, you are strictly prohibited from reviewing, disclosing, copying using or disseminating any of this information or taking any action in reliance on or regarding this information. If you have received this fax in error, please notify us immediately by telephone so that we can arrange for its return to [place]. Phone: [phone], Toll-Free: [phone], Fax: [phone] [place] Test: 1 of 2 Call Id: [id number] [facility] Night - Client TELEPHONE ADVICE RECORD [facility] Patient Name: Desiree [name] Gender: Female DOB: May 24, 1951 Age: 64 Y 2 M 23 D Return Phone Number: [phone] (Primary), [phone] (Secondary) Address: [address] City/State/Zip: [place] [address] Client [facility] Night - Client Client Site [facility] - Night Physician Berniece Andreas Contact Type Call Call Type Triage / Clinical Relationship To Patient Self Return Phone Number [phone] (Primary) Chief Complaint Headache Initial Comment High BP 158/88...med ques, headache PreDisposition Did not know what to do Nurse Assessment Nurse: Enid Skeens, RN, Arline Asp Date/Time (Eastern Time): 07/05/2015 6:03:34 PM Confirm and document reason for call. If symptomatic, describe symptoms. ---Caller states she has High BP 158/88...med ques, headache. Wants to know if she can take a second BP pill due to headache. Has the patient traveled out of the country within the last 30 days? ---Not Applicable Does the patient require triage? ---Yes Related visit to physician within the last 2 weeks? ---Yes Does the PT have any  chronic conditions? (i.e. diabetes, asthma, etc.) ---Yes List chronic conditions. ---HTN, diabetic, kidney issues, bipolar Guidelines Guideline Title Affirmed Question Affirmed Notes Nurse Date/Time (Eastern Time) High Blood Pressure [1] BP < 140 /90 AND [2] taking BP medications (all triage questions negative) Enid Skeens, RN, Arline Asp 07/05/2015 6:07:07 PM Disp. Time [name] Time) Disposition Final User 07/05/2015 6:09:03 PM Home Care Yes Enid Skeens, RN, Arline Asp Caller Understands: Yes Disagree/Comply: Comply Care Advice Given Per Guideline PLEASE NOTE: All timestamps contained within this report are represented as [place] Standard Time. CONFIDENTIALTY NOTICE: This fax transmission is intended only for the addressee. It contains information that is legally privileged, confidential or otherwise protected from use or disclosure. If you are not the intended recipient, you are strictly prohibited from reviewing, disclosing, copying using or disseminating any of this information or taking any action in reliance on or regarding this information. If you have received this fax in error, please notify us immediately by telephone so that we can arrange for its return to [place]. Phone: [phone], Toll-Free: [phone], Fax: [phone] [place]: 2 of 2 Call Id: [id number] Care Advice Given Per Guideline HOME CARE: You should be able to treat this at home. REASSURANCE -- BP under 140 / 90 and TAKING BP MEDS: * For most patients with high blood pressure, the goal is to keep the BP under 140/90 CALL BACK IF: * Your blood pressure is # 140/90 * You become worse. CARE ADVICE given per High Blood Pressure (Adult) guideline. After Care Instructions Given Call Event Type User Date / Time Description Comments User: Thea Alken, RN Date/Time [name] Time): 07/05/2015 6:10:16 PM Caller advised to not take a second BP pill, Losartan, due to the possibility of it dropping the BP too low. Caller stated  her understanding.

## 2015-07-14 ENCOUNTER — Encounter: Payer: Self-pay | Admitting: Podiatry

## 2015-07-14 ENCOUNTER — Ambulatory Visit (INDEPENDENT_AMBULATORY_CARE_PROVIDER_SITE_OTHER): Payer: Commercial Managed Care - HMO | Admitting: Podiatry

## 2015-07-14 VITALS — BP 105/69 | HR 77 | Resp 12

## 2015-07-14 DIAGNOSIS — M2041 Other hammer toe(s) (acquired), right foot: Secondary | ICD-10-CM | POA: Diagnosis not present

## 2015-07-14 DIAGNOSIS — E119 Type 2 diabetes mellitus without complications: Secondary | ICD-10-CM | POA: Diagnosis not present

## 2015-07-14 DIAGNOSIS — L84 Corns and callosities: Secondary | ICD-10-CM

## 2015-07-14 NOTE — Progress Notes (Signed)
   Subjective:    [name], female    DOB: [birth year], 64 y.o.   MRN: [id number]  HPI   This patient presents today complaining of itching rather diffusely on her right and left feet on and off weightbearing. She describes no specific treatment for the itching including self treatment. He says the itching is become somewhat more noticeable over time. Also, patient describes odor occurring in between her toes and denies any open lesions in the area and denies any specific treatment. She is also requesting a generalized foot examination  Patient denies any history of ulceration claudication or amputation  Review of Systems  Skin: Positive for color change.       Objective:   Physical Exam  Patient appears responsive and orientated 3  Vascular: No peripheral edema noted bilaterally DP and PT pulses 2/4 bilaterally Capillary reflex immediate bilaterally  Neurological: Sensation to 10 g monofilament wire intact 3/5 right and 4/5 left Vibratory sensation reactive bilaterally Ankle reflex equal and reactive bilaterally  Dermatologicall Skin texture and turgor within normal limits bilaterally No open skin lesions noted bilaterally Well-healed surgical scars medial first MPJ bilaterally Plantar callus subsecond MPJ right and plantar first and fourth MPJ left Callus posterior right heel There are no other skin lesions noted other than the calluses described above, bilaterally    Musculoskeletal: 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 Other possibilities for itching could include medication or idiopathic itching. 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 I made aware that her basic diabetic foot screen was adequate I told I found no obvious reason for malodor in her feet and suggested she check her shoes to make sure that there was no odor from the shoes themselves. I recommended the use of a pumice stone for the callus that she has in her right and left feet. Also I suggested she could use a silicone wedge between her right hallux and second right toe if she had discomfort in this area. In regards to the itching I told I could not find any specific primary skin lesions associated with the itching, however, itching could be related to possible diabetic neuropathy or  medication intolerance or other unknown causes. She could consult with her primary care physician about the itching.

## 2015-07-14 NOTE — Patient Instructions (Addendum)
Today your diabetic foot examination was satisfactory The friction between your right big toe and second right toe results from residual bunion deformity Okay to insert silicone wedge imaging right great toe and second right toe this is uncomfortable for you Use a pumice stone this and your calluses after showering   Diabetes and Foot Care Diabetes may cause you to have problems because of poor blood supply (circulation) to your feet and legs. This may cause the skin on your feet to become thinner, break easier, and heal more slowly. Your skin may become dry, and the skin may peel and crack. You may also have nerve damage in your legs and feet causing decreased feeling in them. You may not notice minor injuries to your feet that could lead to infections or more serious problems. Taking care of your feet is one of the most important things you can do for yourself.  HOME CARE INSTRUCTIONS  Wear shoes at all times, even in the house. Do not go barefoot. Bare feet are easily injured.  Check your feet daily for blisters, cuts, and redness. If you cannot see the bottom of your feet, use a mirror or ask someone for help.  Wash your feet with warm water (do not use hot water) and mild soap. Then pat your feet and the areas between your toes until they are completely dry. Do not soak your feet as this can dry your skin.  Apply a moisturizing lotion or petroleum jelly (that does not contain alcohol and is unscented) to the skin on your feet and to dry, brittle toenails. Do not apply lotion between your toes.  Trim your toenails straight across. Do not dig under them or around the cuticle. File the edges of your nails with an emery board or nail file.  Do not cut corns or calluses or try to remove them with medicine.  Wear clean socks or stockings every day. Make sure they are not too tight. Do not wear knee-high stockings since they may decrease blood flow to your legs.  Wear shoes that fit properly  and have enough cushioning. To break in new shoes, wear them for just a few hours a day. This prevents you from injuring your feet. Always look in your shoes before you put them on to be sure there are no objects inside.  Do not cross your legs. This may decrease the blood flow to your feet.  If you find a minor scrape, cut, or break in the skin on your feet, keep it and the skin around it clean and dry. These areas may be cleansed with mild soap and water. Do not cleanse the area with peroxide, alcohol, or iodine.  When you remove an adhesive bandage, be sure not to damage the skin around it.  If you have a wound, look at it several times a day to make sure it is healing.  Do not use heating pads or hot water bottles. They may burn your skin. If you have lost feeling in your feet or legs, you may not know it is happening until it is too late.  Make sure your health care provider performs a complete foot exam at least annually or more often if you have foot problems. Report any cuts, sores, or bruises to your health care provider immediately. SEEK MEDICAL CARE IF:   You have an injury that is not healing.  You have cuts or breaks in the skin.  You have an ingrown nail.  You  notice redness on your legs or feet.  You feel burning or tingling in your legs or feet.  You have pain or cramps in your legs and feet.  Your legs or feet are numb.  Your feet always feel cold. SEEK IMMEDIATE MEDICAL CARE IF:   There is increasing redness, swelling, or pain in or around a wound.  There is a red line that goes up your leg.  Pus is coming from a wound.  You develop a fever or as directed by your health care provider.  You notice a bad smell coming from an ulcer or wound. Document Released: 10/06/2000 Document Revised: 06/11/2013 Document Reviewed: 03/18/2013 [facility] Patient Information 2015 [place], [place]. This information is not intended to replace advice given to you by your health  care provider. Make sure you discuss any questions you have with your health care provider. Diabetes and Foot Care Diabetes may cause you to have problems because of poor blood supply (circulation) to your feet and legs. This may cause the skin on your feet to become thinner, break easier, and heal more slowly. Your skin may become dry, and the skin may peel and crack. You may also have nerve damage in your legs and feet causing decreased feeling in them. You may not notice minor injuries to your feet that could lead to infections or more serious problems. Taking care of your feet is one of the most important things you can do for yourself.  HOME CARE INSTRUCTIONS  Wear shoes at all times, even in the house. Do not go barefoot. Bare feet are easily injured.  Check your feet daily for blisters, cuts, and redness. If you cannot see the bottom of your feet, use a mirror or ask someone for help.  Wash your feet with warm water (do not use hot water) and mild soap. Then pat your feet and the areas between your toes until they are completely dry. Do not soak your feet as this can dry your skin.  Apply a moisturizing lotion or petroleum jelly (that does not contain alcohol and is unscented) to the skin on your feet and to dry, brittle toenails. Do not apply lotion between your toes.  Trim your toenails straight across. Do not dig under them or around the cuticle. File the edges of your nails with an emery board or nail file.  Do not cut corns or calluses or try to remove them with medicine.  Wear clean socks or stockings every day. Make sure they are not too tight. Do not wear knee-high stockings since they may decrease blood flow to your legs.  Wear shoes that fit properly and have enough cushioning. To break in new shoes, wear them for just a few hours a day. This prevents you from injuring your feet. Always look in your shoes before you put them on to be sure there are no objects inside.  Do not  cross your legs. This may decrease the blood flow to your feet.  If you find a minor scrape, cut, or break in the skin on your feet, keep it and the skin around it clean and dry. These areas may be cleansed with mild soap and water. Do not cleanse the area with peroxide, alcohol, or iodine.  When you remove an adhesive bandage, be sure not to damage the skin around it.  If you have a wound, look at it several times a day to make sure it is healing.  Do not use heating pads or  hot water bottles. They may burn your skin. If you have lost feeling in your feet or legs, you may not know it is happening until it is too late.  Make sure your health care provider performs a complete foot exam at least annually or more often if you have foot problems. Report any cuts, sores, or bruises to your health care provider immediately. SEEK MEDICAL CARE IF:   You have an injury that is not healing.  You have cuts or breaks in the skin.  You have an ingrown nail.  You notice redness on your legs or feet.  You feel burning or tingling in your legs or feet.  You have pain or cramps in your legs and feet.  Your legs or feet are numb.  Your feet always feel cold. SEEK IMMEDIATE MEDICAL CARE IF:   There is increasing redness, swelling, or pain in or around a wound.  There is a red line that goes up your leg.  Pus is coming from a wound.  You develop a fever or as directed by your health care provider.  You notice a bad smell coming from an ulcer or wound. Document Released: 10/06/2000 Document Revised: 06/11/2013 Document Reviewed: 03/18/2013 [facility] Patient Information 2015 [place], [place]. This information is not intended to replace advice given to you by your health care provider. Make sure you discuss any questions you have with your health care provider.

## 2015-07-27 ENCOUNTER — Telehealth: Payer: Self-pay | Admitting: Internal Medicine

## 2015-07-27 NOTE — Telephone Encounter (Signed)
[facility] - [facility] TELEPHONE ADVICE RECORD   [facility]     Patient Name: Desiree [name] Client [facility]    Client Site [facility]    Physician Berniece Andreas   Gender: Female Contact Type Call  DOB: Jul 10, 1951  Call Type Triage / Clinical  Age: 64 Y 3 M 14 D Relationship To Patient Self  Return Phone Number: [phone] (Primary) Return Phone Number [phone] (Primary)  Address:  Chief Complaint Headache  City/State/Zip: [place]  Initial Comment Caller states she has been having headaches and caller this morning was nauseated.     Nurse Assessment  Nurse: Logan Bores, RN, Melissa Date/Time (Eastern Time): 07/27/2015 5:04:59 PM  Confirm and document reason for call. If symptomatic, describe symptoms. ---Caller states she has been having headaches and caller this morning was nauseated.   Has the patient traveled out of the country within the last 30 days? ---Not Applicable   Does the patient have any new or worsening symptoms? ---Yes   Will a triage be completed? ---Yes   Related visit to physician within the last 2 weeks? ---No   Does the PT have any chronic conditions? (i.e. diabetes, asthma, etc.) ---Yes   List chronic conditions. ---Migraines headaches, hypertension, diabetes, depression/anxiety, dx Bipolar , Chronic renal failure Type 3 , COPD     Guidelines      Guideline Title Affirmed Question Affirmed Notes Nurse Date/Time (Eastern Time)        Disp. Time [name] Time) Disposition Final User                 After Care Instructions Given     Call Event Type User Date / Time Description                          [facility] TELEPHONE ADVICE RECORD   [facility]     Patient Name: Desiree [name] Client [facility]    [facility] Site  [facility]    Physician  Berniece Andreas     Contact Type Call  Gender: Female Call Type Triage / Clinical  DOB: 1951-10-14  Relationship To Patient Self  Age: 64 Y 3 M 14 D Return Phone Number [phone] (Primary)  Return Phone Number: [phone] (Primary) Chief Complaint Headache  Address:  Initial Comment Caller states she has been having headaches and caller this morning was nauseated.   City/State/Zip: [place]  PreDisposition Go to ED    Nurse Assessment  Nurse: Logan Bores, RN, Melissa Date/Time (Eastern Time): 07/27/2015 5:04:59 PM  Confirm and document reason for call. If symptomatic, describe symptoms. ---Caller states she has been having headaches and caller this morning was nauseated.   Has the patient traveled out of the country within the last 30 days? ---Not Applicable   Does the patient have any new or worsening symptoms? ---Yes   Will a triage be completed? ---Yes   Related visit to physician within the last 2 weeks? ---No   Does the PT have any chronic conditions? (i.e. diabetes, asthma, etc.) ---Yes   List chronic conditions. ---Migraines headaches, hypertension, diabetes, depression/anxiety, dx Bipolar , Chronic renal failure Type 3 , COPD     Guidelines      Guideline Title Affirmed Question Affirmed Notes Nurse Date/Time (Eastern Time)  Headache [1]  New headache AND [2] age > 55  Logan Bores, RN, Melissa 07/27/2015 5:09:53 PM  Disp. Time [name] Time) Disposition Final User         07/27/2015 5:23:11 PM See Physician within 24 Hours Yes Logan Bores, RN, Efraim Kaufmann         Caller Understands: Yes   Disagree/Comply: Comply      Care Advice Given Per Guideline         SEE PHYSICIAN WITHIN 24 HOURS: * IF OFFICE WILL BE OPEN: You need to be seen within the next 24 hours. Call your doctor when the office opens, and make an appointment. ACETAMINOPHEN (E.G., TYLENOL): IBUPROFEN (E.G., MOTRIN, ADVIL): REST: Lie down in a dark quiet place and relax until feeling better. LOCAL COLD: Apply a cold wet washcloth or cold  pack to the forehead for 20 minutes CALL BACK IF: * You become worse. * Difficulty with speaking * Numbness or weakness of the face, arm or leg * Blurred vision or double vision occurs   After Care Instructions Given     Call Event Type User Date / Time Description               Referrals   REFERRED TO PCP OFFICE

## 2015-07-28 ENCOUNTER — Ambulatory Visit (INDEPENDENT_AMBULATORY_CARE_PROVIDER_SITE_OTHER)
Admission: RE | Admit: 2015-07-28 | Discharge: 2015-07-28 | Disposition: A | Discharge status: Home / Self Care | Payer: Commercial Managed Care - HMO | Source: Ambulatory Visit | Attending: Internal Medicine | Admitting: Internal Medicine

## 2015-07-28 ENCOUNTER — Encounter: Payer: Self-pay | Admitting: Internal Medicine

## 2015-07-28 ENCOUNTER — Ambulatory Visit (INDEPENDENT_AMBULATORY_CARE_PROVIDER_SITE_OTHER): Payer: Commercial Managed Care - HMO | Admitting: Internal Medicine

## 2015-07-28 ENCOUNTER — Telehealth: Payer: Self-pay

## 2015-07-28 VITALS — BP 122/60 | Temp 99.0°F | Wt 148.5 lb

## 2015-07-28 DIAGNOSIS — R51 Headache: Secondary | ICD-10-CM | POA: Diagnosis not present

## 2015-07-28 DIAGNOSIS — R079 Chest pain, unspecified: Secondary | ICD-10-CM | POA: Diagnosis not present

## 2015-07-28 DIAGNOSIS — B029 Zoster without complications: Secondary | ICD-10-CM

## 2015-07-28 DIAGNOSIS — N289 Disorder of kidney and ureter, unspecified: Secondary | ICD-10-CM

## 2015-07-28 DIAGNOSIS — Z6379 Other stressful life events affecting family and household: Secondary | ICD-10-CM

## 2015-07-28 DIAGNOSIS — Z79899 Other long term (current) drug therapy: Secondary | ICD-10-CM

## 2015-07-28 DIAGNOSIS — R112 Nausea with vomiting, unspecified: Secondary | ICD-10-CM

## 2015-07-28 DIAGNOSIS — R519 Headache, unspecified: Secondary | ICD-10-CM

## 2015-07-28 DIAGNOSIS — H538 Other visual disturbances: Secondary | ICD-10-CM | POA: Diagnosis not present

## 2015-07-28 LAB — COMPREHENSIVE METABOLIC PANEL
ALT: 17 U/L (ref 0–35)
AST: 28 U/L (ref 0–37)
Albumin: 4.6 g/dL (ref 3.5–5.2)
Alkaline Phosphatase: 81 U/L (ref 39–117)
BUN: 20 mg/dL (ref 6–23)
CO2: 28 mEq/L (ref 19–32)
Calcium: 10.2 mg/dL (ref 8.4–10.5)
Chloride: 102 mEq/L (ref 96–112)
Creatinine, Ser: 1.37 mg/dL — ABNORMAL HIGH (ref 0.40–1.20)
GFR: 50.03 mL/min — ABNORMAL LOW (ref >60.00)
Glucose, Bld: 89 mg/dL (ref 70–99)
Potassium: 4 mEq/L (ref 3.5–5.1)
Sodium: 140 mEq/L (ref 135–145)
Total Bilirubin: 0.7 mg/dL (ref 0.2–1.2)
Total Protein: 8.4 g/dL — ABNORMAL HIGH (ref 6.0–8.3)

## 2015-07-28 LAB — CBC WITH DIFFERENTIAL/PLATELET
Basophils Absolute: 0 10*3/uL (ref 0.0–0.1)
Basophils Relative: 0.5 % (ref 0.0–3.0)
Eosinophils Absolute: 0.3 10*3/uL (ref 0.0–0.7)
Eosinophils Relative: 3.6 % (ref 0.0–5.0)
HCT: 42.4 % (ref 36.0–46.0)
Hemoglobin: 14.1 g/dL (ref 12.0–15.0)
Lymphocytes Relative: 35.6 % (ref 12.0–46.0)
Lymphs Abs: 2.6 10*3/uL (ref 0.7–4.0)
MCHC: 33.2 g/dL (ref 30.0–36.0)
MCV: 90.5 fl (ref 78.0–100.0)
Monocytes Absolute: 0.5 10*3/uL (ref 0.1–1.0)
Monocytes Relative: 6.8 % (ref 3.0–12.0)
Neutro Abs: 3.9 10*3/uL (ref 1.4–7.7)
Neutrophils Relative %: 53.5 % (ref 43.0–77.0)
Platelets: 366 10*3/uL (ref 150.0–400.0)
RBC: 4.68 Mil/uL (ref 3.87–5.11)
RDW: 14.5 % (ref 11.5–15.5)
WBC: 7.2 10*3/uL (ref 4.0–10.5)

## 2015-07-28 LAB — POCT URINALYSIS DIP (MANUAL ENTRY)
Blood, UA: NEGATIVE
Glucose, UA: NEGATIVE
Ketones, POC UA: NEGATIVE
Leukocytes, UA: NEGATIVE
Nitrite, UA: NEGATIVE
Protein Ur, POC: 100 — AB
Spec Grav, UA: >=1.03
Urobilinogen, UA: 0.2
pH, UA: 5.5

## 2015-07-28 LAB — SEDIMENTATION RATE: Sed Rate: 26 mm/hr — ABNORMAL HIGH (ref 0–22)

## 2015-07-28 LAB — TSH: TSH: 1.74 u[IU]/mL (ref 0.35–4.50)

## 2015-07-28 MED ORDER — VALACYCLOVIR HCL 1 G PO TABS: 1000.0000 mg | ORAL_TABLET | Freq: Three times a day (TID) | ORAL | Status: DC

## 2015-07-28 MED ORDER — ONDANSETRON 4 MG PO TBDP: 4.0000 mg | ORAL_TABLET | Freq: Three times a day (TID) | ORAL | Status: DC | PRN

## 2015-07-28 NOTE — Patient Instructions (Addendum)
I think you have early shingles   Take medication right away.   Headache could be a new  Migraine from stress other.   But unusual getting head scan .   To make sure  No other problems causing your symptoms .  Can take antinausea medication to help. If labs good  May just need to wait out the migraine. Counseling help with  Stress family illness.  FU depending on labs and how you are doing.       Shingles Shingles, which is also known as herpes zoster, is an infection that causes a painful skin rash and fluid-filled blisters. Shingles is not related to genital herpes, which is a sexually transmitted infection.   Shingles only develops in people who:  Have had chickenpox.  Have received the chickenpox vaccine. (This is rare.) CAUSES Shingles is caused by varicella-zoster virus (VZV). This is the same virus that causes chickenpox. After exposure to VZV, the virus stays in the body in an inactive (dormant) state. Shingles develops if the virus reactivates. This can happen many years after the initial exposure to VZV. It is not known what causes this virus to reactivate. RISK FACTORS People who have had chickenpox or received the chickenpox vaccine are at risk for shingles. Infection is more common in people who:  Are older than age 72.  Have a weakened defense (immune) system, such as those with HIV, AIDS, or cancer.  Are taking medicines that weaken the immune system, such as transplant medicines.  Are under great stress. SYMPTOMS Early symptoms of this condition include itching, tingling, and pain in an area on your skin. Pain may be described as burning, stabbing, or throbbing. A few days or weeks after symptoms start, a painful red rash appears, usually on one side of the body in a bandlike or beltlike pattern. The rash eventually turns into fluid-filled blisters that break open, scab over, and dry up in about 2-3 weeks. At any time during the infection, you may also  develop:  A fever.  Chills.  A headache.  An upset stomach. DIAGNOSIS This condition is diagnosed with a skin exam. Sometimes, skin or fluid samples are taken from the blisters before a diagnosis is made. These samples are examined under a microscope or sent to a lab for testing. TREATMENT There is no specific cure for this condition. Your health care provider will probably prescribe medicines to help you manage pain, recover more quickly, and avoid long-term problems. Medicines may include:  Antiviral drugs.  Anti-inflammatory drugs.  Pain medicines. If the area involved is on your face, you may be referred to a specialist, such as an eye doctor (ophthalmologist) or an ear, nose, and throat (ENT) doctor to help you avoid eye problems, chronic pain, or disability. HOME CARE INSTRUCTIONS Medicines  Take medicines only as directed by your health care provider.  Apply an anti-itch or numbing cream to the affected area as directed by your health care provider. Blister and Rash Care  Take a cool bath or apply cool compresses to the area of the rash or blisters as directed by your health care provider. This may help with pain and itching.  Keep your rash covered with a loose bandage (dressing). Wear loose-fitting clothing to help ease the pain of material rubbing against the rash.  Keep your rash and blisters clean with mild soap and cool water or as directed by your health care provider.  Check your rash every day for signs of infection. These include  redness, swelling, and pain that lasts or increases.  Do not pick your blisters.  Do not scratch your rash. General Instructions  Rest as directed by your health care provider.  Keep all follow-up visits as directed by your health care provider. This is important.  Until your blisters scab over, your infection can cause chickenpox in people who have never had it or been vaccinated against it. To prevent this from happening, avoid  contact with other people, especially:  Babies.  Pregnant women.  Children who have eczema.  Elderly people who have transplants.  People who have chronic illnesses, such as leukemia or AIDS. SEEK MEDICAL CARE IF:  Your pain is not relieved with prescribed medicines.  Your pain does not get better after the rash heals.  Your rash looks infected. Signs of infection include redness, swelling, and pain that lasts or increases. SEEK IMMEDIATE MEDICAL CARE IF:  The rash is on your face or nose.  You have facial pain, pain around your eye area, or loss of feeling on one side of your face.  You have ear pain or you have ringing in your ear.  You have loss of taste.  Your condition gets worse.   This information is not intended to replace advice given to you by your health care provider. Make sure you discuss any questions you have with your health care provider.   Document Released: 10/09/2005 Document Revised: 10/30/2014 Document Reviewed: 08/20/2014 [employer] Interactive Patient Education [employer]
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
## 2015-07-28 NOTE — Telephone Encounter (Signed)
WP aware.

## 2015-07-28 NOTE — Telephone Encounter (Signed)
Misty Stanley from CT called and states pt's CT was negative but does have paranasal sinus disease.  Misty Stanley let pt go home but wanted Dr. Fabian Sharp to be aware to review the CT and call pt with further instructions.

## 2015-07-28 NOTE — Telephone Encounter (Signed)
Appointment with Dr. Fabian Sharp today

## 2015-07-29 ENCOUNTER — Other Ambulatory Visit: Payer: Self-pay | Admitting: Family Medicine

## 2015-07-29 MED ORDER — DOXYCYCLINE HYCLATE 100 MG PO TABS: 100.0000 mg | ORAL_TABLET | Freq: Two times a day (BID) | ORAL | Status: DC

## 2015-08-02 ENCOUNTER — Other Ambulatory Visit: Payer: Self-pay

## 2015-08-03 ENCOUNTER — Telehealth: Payer: Self-pay | Admitting: Internal Medicine

## 2015-08-03 NOTE — Telephone Encounter (Signed)
Patient would like a call back regarding approved CT scan... She's rcvd documentation and states she needs a callback.

## 2015-08-03 NOTE — Telephone Encounter (Signed)
Spoke to the pt.  Explained that I believe she received an authorization letter (best I can tell over the phone) and she can keep that for her records.  While on the phone she reports that her neck continues to burn and itch.  New itching on her head.  Please advise any recommendations.

## 2015-08-03 NOTE — Telephone Encounter (Signed)
Make sure takes all of the valtrex.  Contact us if gets rash near eye .   Is she talking about the right neck ( ?ZOSTER?)or the patch near her left scalp. (?ECZEMA)  The a rea on the left we could rx   With tms cream 0.1% disp 15 gram apply bid  For 2-3 weeks  As needed  Otherwise keep fu visit  08/09/2015

## 2015-08-04 MED ORDER — TRIAMCINOLONE ACETONIDE 0.1 % EX CREA: 1.0000 "application " | TOPICAL_CREAM | Freq: Two times a day (BID) | CUTANEOUS | Status: DC

## 2015-08-04 NOTE — Telephone Encounter (Signed)
Spoke to the pt.  Area of concern (itching) on left side.  Sent in tms cream 0.1%.  Pt will pick up at the pharmacy. Will complete Valtrex and call back if needed.

## 2015-08-09 ENCOUNTER — Encounter: Payer: Self-pay | Admitting: Internal Medicine

## 2015-08-09 ENCOUNTER — Ambulatory Visit: Payer: Self-pay | Admitting: Internal Medicine

## 2015-08-09 ENCOUNTER — Ambulatory Visit (INDEPENDENT_AMBULATORY_CARE_PROVIDER_SITE_OTHER)
Admission: RE | Admit: 2015-08-09 | Discharge: 2015-08-09 | Disposition: A | Discharge status: Home / Self Care | Payer: Commercial Managed Care - HMO | Source: Ambulatory Visit | Attending: Internal Medicine | Admitting: Internal Medicine

## 2015-08-09 ENCOUNTER — Ambulatory Visit (INDEPENDENT_AMBULATORY_CARE_PROVIDER_SITE_OTHER): Payer: Commercial Managed Care - HMO | Admitting: Internal Medicine

## 2015-08-09 VITALS — BP 128/80 | Temp 98.6°F | Ht 62.5 in | Wt 154.5 lb

## 2015-08-09 DIAGNOSIS — R079 Chest pain, unspecified: Secondary | ICD-10-CM

## 2015-08-09 DIAGNOSIS — J019 Acute sinusitis, unspecified: Secondary | ICD-10-CM

## 2015-08-09 DIAGNOSIS — N289 Disorder of kidney and ureter, unspecified: Secondary | ICD-10-CM | POA: Diagnosis not present

## 2015-08-09 DIAGNOSIS — R05 Cough: Secondary | ICD-10-CM | POA: Diagnosis not present

## 2015-08-09 DIAGNOSIS — L989 Disorder of the skin and subcutaneous tissue, unspecified: Secondary | ICD-10-CM

## 2015-08-09 NOTE — Patient Instructions (Signed)
Stay on the nasonex  ... To prevent   Flare of sinusitis.   Will get opinion about the scalp  Patches  To dermatology .   Plan preventive visit and labs in  March 2017  With hg a1c   If side pain gets worse or not gone get back with use.  Get chest x ray  If continuing .
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
## 2015-08-10 DIAGNOSIS — R112 Nausea with vomiting, unspecified: Secondary | ICD-10-CM | POA: Diagnosis not present

## 2015-08-10 DIAGNOSIS — K219 Gastro-esophageal reflux disease without esophagitis: Secondary | ICD-10-CM | POA: Diagnosis not present

## 2015-08-10 DIAGNOSIS — R634 Abnormal weight loss: Secondary | ICD-10-CM | POA: Diagnosis not present

## 2015-08-10 DIAGNOSIS — R1012 Left upper quadrant pain: Secondary | ICD-10-CM | POA: Diagnosis not present

## 2015-08-11 ENCOUNTER — Other Ambulatory Visit: Payer: Self-pay | Admitting: Gastroenterology

## 2015-08-11 ENCOUNTER — Telehealth: Payer: Self-pay | Admitting: Internal Medicine

## 2015-08-11 DIAGNOSIS — K317 Polyp of stomach and duodenum: Secondary | ICD-10-CM | POA: Diagnosis not present

## 2015-08-11 DIAGNOSIS — R1013 Epigastric pain: Secondary | ICD-10-CM

## 2015-08-11 DIAGNOSIS — R634 Abnormal weight loss: Secondary | ICD-10-CM | POA: Diagnosis not present

## 2015-08-11 DIAGNOSIS — R112 Nausea with vomiting, unspecified: Secondary | ICD-10-CM | POA: Diagnosis not present

## 2015-08-11 NOTE — Telephone Encounter (Signed)
dotn understand message    Saline spray os not rx   Cortisone sprays: she can use  nasacort or flonase OTC instead of the rx  nasonex that was on her med list .

## 2015-08-11 NOTE — Telephone Encounter (Signed)
Spoke to the pt.  She informed me that the otc nasal spray said to only use for three days.  Advised that she try an otc nasacort, flonase etc....Marland KitchenMarland KitchenAdvised she talk to the pharmacist to help advise her and to see if it comes in a generic.

## 2015-08-11 NOTE — Telephone Encounter (Signed)
Pt call to ask for a nasal spray rx or the name of something she can use over the counter  . She said it is a saline spray

## 2015-08-20 ENCOUNTER — Ambulatory Visit
Admission: RE | Admit: 2015-08-20 | Discharge: 2015-08-20 | Disposition: A | Discharge status: Home / Self Care | Payer: Commercial Managed Care - HMO | Source: Ambulatory Visit | Attending: Gastroenterology | Admitting: Gastroenterology

## 2015-08-20 DIAGNOSIS — R634 Abnormal weight loss: Secondary | ICD-10-CM

## 2015-08-20 DIAGNOSIS — R112 Nausea with vomiting, unspecified: Secondary | ICD-10-CM | POA: Diagnosis not present

## 2015-08-20 DIAGNOSIS — R1013 Epigastric pain: Secondary | ICD-10-CM

## 2015-08-20 MED ORDER — IOPAMIDOL (ISOVUE-300) INJECTION 61%
75.0000 mL | Freq: Once | INTRAVENOUS | Status: AC | PRN
  Administered 2015-08-20: 75 mL via INTRAVENOUS

## 2015-08-23 ENCOUNTER — Encounter: Payer: Self-pay | Admitting: Internal Medicine

## 2015-08-26 DIAGNOSIS — E119 Type 2 diabetes mellitus without complications: Secondary | ICD-10-CM | POA: Diagnosis not present

## 2015-08-26 DIAGNOSIS — H25013 Cortical age-related cataract, bilateral: Secondary | ICD-10-CM | POA: Diagnosis not present

## 2015-08-26 DIAGNOSIS — H35033 Hypertensive retinopathy, bilateral: Secondary | ICD-10-CM | POA: Diagnosis not present

## 2015-08-26 LAB — HM DIABETES EYE EXAM

## 2015-08-27 ENCOUNTER — Telehealth: Payer: Self-pay | Admitting: Family Medicine

## 2015-08-27 DIAGNOSIS — N289 Disorder of kidney and ureter, unspecified: Secondary | ICD-10-CM

## 2015-08-27 NOTE — Telephone Encounter (Signed)
Pt left a message that she needs a referral.  Has an appt for 10/04/15.  Will need to call the pt back to get more information.

## 2015-08-30 NOTE — Telephone Encounter (Signed)
Dr Lisabeth [facility],    Address: [address] [place], [place] [address]  Phone: [phone]  Pt not seen in a year and this is her yearly kidney check up.

## 2015-08-31 NOTE — Telephone Encounter (Signed)
Referral placed in the system. 

## 2015-09-02 DIAGNOSIS — F314 Bipolar disorder, current episode depressed, severe, without psychotic features: Secondary | ICD-10-CM | POA: Diagnosis not present

## 2015-09-07 ENCOUNTER — Telehealth: Payer: Self-pay | Admitting: Internal Medicine

## 2015-09-07 NOTE — Telephone Encounter (Signed)
error 

## 2015-09-07 NOTE — Telephone Encounter (Signed)
Patient Name: Desiree [name]  DOB: 1951/02/14    Initial Comment Caller states that she has headaches, on blood pressure medication, has neck pain, blood pressure is up, and has shoulder pain   Nurse Assessment  Nurse: Annye English, RN, Angelique Blonder Date/Time (Eastern Time): 09/07/2015 4:19:52 PM  Confirm and document reason for call. If symptomatic, describe symptoms. ---Pt reports head, neck, and shoulder pain. States her BP was 180-190 yesterday but has not taken the BP at all today. Takes BP med but does not know what the name of the med is. States she is not at home at this time. States she is having left neck, left shoulder pain which is 6.5-7/10. Taking Tylenol 1000mg  po q 4 hrs.  Has the patient traveled out of the country within the last 30 days? ---Not Applicable  Does the patient have any new or worsening symptoms? ---Yes  Will a triage be completed? ---Yes  Related visit to physician within the last 2 weeks? ---No  Does the PT have any chronic conditions? (i.e. diabetes, asthma, etc.) ---Yes  List chronic conditions. ---DM, HTN, Kidney dx     Guidelines    Guideline Title Affirmed Question Affirmed Notes  High Blood Pressure BP ? 160/100    Final Disposition User   See PCP When Office is Open (within 3 days) Carmon, RN,    Comments  Pt requests to call the [facility] in the am during reg bus hrs to make an appt with the PCP. States she does not want RN to attempt to make the appt this eve. Based on RN clinical knowledge, advised Tylenol dose is 1000mg  po q 8 hrs or 650mg  po q 4-6 hrs. Verb understanding.   Referrals  REFERRED TO PCP OFFICE   Disagree/Comply: Comply

## 2015-09-08 NOTE — Telephone Encounter (Signed)
Spoke to the patient.  Today she denies the headache.  Continues with the neck and shoulder pain. Otherwise, feeling well.    BP last night was 130s/82.  Stated when her bp is up that she has a chest discomfort.  EKG performed on 07/28/15 was normal.  Advised that she check her bp tonight when she get home (currently with her granddaughter) and check it again in the morning.  Will call to discuss with her.  Will also discuss with [facility]

## 2015-09-09 NOTE — Telephone Encounter (Signed)
Pt will take bp for 10-14 day and after sitting and resting 3-5 min will take second reading. Pt is aware pt was given this info by misty she was driving

## 2015-09-09 NOTE — Telephone Encounter (Signed)
BP readings 134/86 this morning and 146/82 last night before bed.

## 2015-09-09 NOTE — Telephone Encounter (Signed)
Take blood pressure readings twice a day for  10 -14 days    After stilting and resting  3-5 minutes  Take second reading.   Send in readings OR  if continually elevaetd  140/90 and above then make OV to discuss.  She had a normal myoview stress test in march 2016 which is reassuring  .

## 2015-09-10 NOTE — Telephone Encounter (Signed)
Spoke to the pt.  She has not taken bp or pulse this morning.  Advised her to keep a record of her bp readings and pulse readings for the weekend.  Will check on her Monday after the weekend.

## 2015-09-13 ENCOUNTER — Emergency Department (HOSPITAL_COMMUNITY)
Admission: EM | Admit: 2015-09-13 | Discharge: 2015-09-13 | Disposition: A | Discharge status: Home / Self Care | Payer: Commercial Managed Care - HMO | Attending: Emergency Medicine | Admitting: Emergency Medicine

## 2015-09-13 ENCOUNTER — Emergency Department (HOSPITAL_COMMUNITY): Payer: Commercial Managed Care - HMO

## 2015-09-13 ENCOUNTER — Encounter (HOSPITAL_COMMUNITY): Payer: Self-pay | Admitting: Emergency Medicine

## 2015-09-13 DIAGNOSIS — E119 Type 2 diabetes mellitus without complications: Secondary | ICD-10-CM | POA: Insufficient documentation

## 2015-09-13 DIAGNOSIS — R52 Pain, unspecified: Secondary | ICD-10-CM | POA: Diagnosis not present

## 2015-09-13 DIAGNOSIS — R200 Anesthesia of skin: Secondary | ICD-10-CM

## 2015-09-13 DIAGNOSIS — R202 Paresthesia of skin: Secondary | ICD-10-CM | POA: Diagnosis not present

## 2015-09-13 DIAGNOSIS — Z79899 Other long term (current) drug therapy: Secondary | ICD-10-CM | POA: Diagnosis not present

## 2015-09-13 DIAGNOSIS — M25512 Pain in left shoulder: Secondary | ICD-10-CM | POA: Diagnosis not present

## 2015-09-13 DIAGNOSIS — Z8619 Personal history of other infectious and parasitic diseases: Secondary | ICD-10-CM | POA: Diagnosis not present

## 2015-09-13 DIAGNOSIS — Z86010 Personal history of colonic polyps: Secondary | ICD-10-CM | POA: Insufficient documentation

## 2015-09-13 DIAGNOSIS — F319 Bipolar disorder, unspecified: Secondary | ICD-10-CM | POA: Insufficient documentation

## 2015-09-13 DIAGNOSIS — E785 Hyperlipidemia, unspecified: Secondary | ICD-10-CM | POA: Insufficient documentation

## 2015-09-13 DIAGNOSIS — Z8781 Personal history of (healed) traumatic fracture: Secondary | ICD-10-CM | POA: Diagnosis not present

## 2015-09-13 DIAGNOSIS — Z87891 Personal history of nicotine dependence: Secondary | ICD-10-CM | POA: Insufficient documentation

## 2015-09-13 DIAGNOSIS — Z880 Allergy status to penicillin: Secondary | ICD-10-CM | POA: Diagnosis not present

## 2015-09-13 DIAGNOSIS — M542 Cervicalgia: Secondary | ICD-10-CM | POA: Diagnosis not present

## 2015-09-13 DIAGNOSIS — K219 Gastro-esophageal reflux disease without esophagitis: Secondary | ICD-10-CM | POA: Diagnosis not present

## 2015-09-13 DIAGNOSIS — R079 Chest pain, unspecified: Secondary | ICD-10-CM | POA: Diagnosis not present

## 2015-09-13 LAB — CBC WITH DIFFERENTIAL/PLATELET
Basophils Absolute: 0.1 10*3/uL (ref 0.0–0.1)
Basophils Relative: 1 %
Eosinophils Absolute: 0.3 10*3/uL (ref 0.0–0.7)
Eosinophils Relative: 4 %
HCT: 37 % (ref 36.0–46.0)
Hemoglobin: 12 g/dL (ref 12.0–15.0)
Lymphocytes Relative: 43 %
Lymphs Abs: 3.3 10*3/uL (ref 0.7–4.0)
MCH: 29.8 pg (ref 26.0–34.0)
MCHC: 32.4 g/dL (ref 30.0–36.0)
MCV: 91.8 fL (ref 78.0–100.0)
Monocytes Absolute: 0.5 10*3/uL (ref 0.1–1.0)
Monocytes Relative: 6 %
Neutro Abs: 3.6 10*3/uL (ref 1.7–7.7)
Neutrophils Relative %: 46 %
Platelets: 323 10*3/uL (ref 150–400)
RBC: 4.03 MIL/uL (ref 3.87–5.11)
RDW: 14 % (ref 11.5–15.5)
WBC: 7.7 10*3/uL (ref 4.0–10.5)

## 2015-09-13 LAB — TROPONIN I: Troponin I: <0.03 ng/mL (ref <0.031)

## 2015-09-13 LAB — BASIC METABOLIC PANEL
Anion gap: 7 (ref 5–15)
BUN: 8 mg/dL (ref 6–20)
CO2: 27 mmol/L (ref 22–32)
Calcium: 9.6 mg/dL (ref 8.9–10.3)
Chloride: 105 mmol/L (ref 101–111)
Creatinine, Ser: 1.27 mg/dL — ABNORMAL HIGH (ref 0.44–1.00)
GFR calc Af Amer: 51 mL/min — ABNORMAL LOW (ref >60)
GFR calc non Af Amer: 44 mL/min — ABNORMAL LOW (ref >60)
Glucose, Bld: 101 mg/dL — ABNORMAL HIGH (ref 65–99)
Potassium: 3.5 mmol/L (ref 3.5–5.1)
Sodium: 139 mmol/L (ref 135–145)

## 2015-09-13 MED ORDER — ACETAMINOPHEN 500 MG PO TABS: 1000.0000 mg | ORAL_TABLET | Freq: Four times a day (QID) | ORAL | Status: DC | PRN

## 2015-09-13 MED ORDER — ORPHENADRINE CITRATE ER 100 MG PO TB12: 100.0000 mg | ORAL_TABLET | Freq: Two times a day (BID) | ORAL | Status: DC

## 2015-09-13 MED ORDER — ACETAMINOPHEN 500 MG PO TABS
1000.0000 mg | ORAL_TABLET | Freq: Once | ORAL | Status: AC
  Administered 2015-09-13: 1000 mg via ORAL
  Filled 2015-09-13: qty 2

## 2015-09-13 MED ORDER — CYCLOBENZAPRINE HCL 10 MG PO TABS
5.0000 mg | ORAL_TABLET | Freq: Once | ORAL | Status: AC
  Administered 2015-09-13: 5 mg via ORAL
  Filled 2015-09-13: qty 1

## 2015-09-13 NOTE — Telephone Encounter (Signed)
I agree

## 2015-09-13 NOTE — ED Notes (Signed)
Dr. Donnald Garre made aware of numbness pt is having.

## 2015-09-13 NOTE — Telephone Encounter (Signed)
Spoke to the pt.  She reports BPs in the 130s,160s and 170s over the weekend.  Checked her bp while on the phone and is 167/95 with a pulse of 121.  Pt sounded very winded on the phone.  She stated she was very sob, some mild chest pain, dizziness (almost fell earlier) and headache.  Advised that she seek medical attention at ED now.  Stated that she will try to drive.  Advised against driving and suggested she call 911.  Pt agreed.  Will notify [facility].

## 2015-09-13 NOTE — ED Notes (Signed)
Patient transported to MRI 

## 2015-09-13 NOTE — ED Notes (Signed)
Patient transported to X-ray 

## 2015-09-13 NOTE — ED Provider Notes (Signed)
CSN: [id number]     Arrival date & time 09/13/15  1804 History   First MD Initiated Contact with Patient 09/13/15 1820     Chief Complaint  Patient presents with  . Chest Pain     (Consider location/radiation/quality/duration/timing/severity/associated sxs/prior Treatment) HPI Patient poor she's been expressing pain that goes along her left neck, top of her shoulder and radiates into her arm and sometimes up the side of her head. She reports it has been coming and going for several weeks. She states she is more concerned now because she feels like there is some tingling and numbness associated as well. There is been no weakness or difficulty using the extremity. She states sometimes it hurts along the left side of her chest under her arm. No fevers or cough. No diaphoresis. No lower extremity swelling or calf pain. Past Medical History  Diagnosis Date  . Hyperlipidemia   . GERD (gastroesophageal reflux disease)   . Depression   . HH (hiatus hernia)   . FH: colonic polyps   . Fractured elbow     right   . History of transfusion of packed red blood cells   . Bipolar disorder ([place])   . Osteoarthritis of more than one site   . Seasonal allergies   . History of carpal tunnel syndrome   . Neuroleptic-induced tardive dyskinesia   . Diabetes mellitus without complication ([place])   . CANDIDIASIS, ESOPHAGEAL 07/28/2009    Qualifier: Diagnosis of  By: [name] MD, [name]    Past Surgical History  Procedure Laterality Date  . Back fusion  2002  . Rt. toe bunion    . Knee surgery    . Juvara osteomy    . Carpal tunnel release  yates    left  . External ear surgery Left   . Finger surgery Left   . Colonoscopy N/A 01/05/2014    Procedure: COLONOSCOPY;  Surgeon: [name], MD;  Location: [facility];  Service: Endoscopy;  Laterality: N/A;   Family History  Problem Relation Age of Onset  . Heart attack Father   . Heart disease Father   . Throat cancer Brother   . Diabetes Mother   .  Hypertension Mother   . Heart attack Mother   . Diabetes type II Brother   . Heart disease Brother    Social History  Substance Use Topics  . Smoking status: Former Smoker -- 2.00 packs/day for 20 years    Types: Cigarettes    Quit date: 10/23/2002  . Smokeless tobacco: Never Used  . Alcohol Use: No   OB History    No data available     Review of Systems  10 Systems reviewed and are negative for acute change except as noted in the HPI.   Allergies  Amoxicillin; Codeine; Hydrocodone-acetaminophen; and Penicillins  Home Medications   Prior to Admission medications   Medication Sig Start Date End Date Taking? Authorizing Provider  acetaminophen (TYLENOL) 500 MG tablet Take 500 mg by mouth every 6 (six) hours as needed for mild pain.   Yes Historical Provider, MD  atorvastatin (LIPITOR) 20 MG tablet Take 1 tablet (20 mg total) by mouth daily. 05/24/15  Yes [name], MD  B Complex Vitamins (VITAMIN B COMPLEX PO) Take 1 tablet by mouth daily.   Yes Historical Provider, MD  cetirizine (ZYRTEC) 10 MG tablet Take 10 mg by mouth at bedtime.    Yes Historical Provider, MD  DULoxetine (CYMBALTA) 60 MG capsule Take 1  capsule (60 mg total) by mouth daily. 09/01/14  Yes Mojeed Akintayo  Ginkgo Biloba 120 MG CAPS Take 2 capsules by mouth daily.   Yes Historical Provider, MD  lamoTRIgine (LAMICTAL) 25 MG tablet Take 1 tablet (25 mg total) by mouth 2 (two) times daily. 10/06/14  Yes [name], PA-C  LORazepam (ATIVAN) 1 MG tablet Take 1 mg by mouth at bedtime. For anxiety 11/23/14  Yes Historical Provider, MD  losartan (COZAAR) 25 MG tablet Take 1 tablet (25 mg total) by mouth daily. 05/24/15  Yes [name], MD  mometasone (NASONEX) 50 MCG/ACT nasal spray Place 2 sprays into the nose daily. 03/23/15  Yes [name], MD  Multiple Vitamin (MULTIVITAMIN WITH MINERALS) TABS tablet Take 1 tablet by mouth daily.   Yes Historical Provider, MD  Multiple Vitamins-Minerals (CENTRUM SILVER PO)  Take 1 tablet by mouth daily.    Yes Historical Provider, MD  ondansetron (ZOFRAN-ODT) 4 MG disintegrating tablet Take 1 tablet (4 mg total) by mouth every 8 (eight) hours as needed for nausea or vomiting. 07/28/15  Yes [name], MD  pantoprazole (PROTONIX) 40 MG tablet Take 40 mg by mouth daily.   Yes Historical Provider, MD  triamcinolone cream (KENALOG) 0.1 % Apply 1 application topically 2 (two) times daily. 08/04/15  Yes [name], MD  vitamin E 400 UNIT capsule Take 400 Units by mouth daily.   Yes Historical Provider, MD  acetaminophen (TYLENOL) 500 MG tablet Take 2 tablets (1,000 mg total) by mouth every 6 (six) hours as needed. 09/13/15   [name], MD  Blood Glucose Monitoring Suppl (ACCU-CHEK NANO SMARTVIEW) W/DEVICE KIT Use to check blood sugar 1-2 times daily 11/26/14   [name], MD  glucose blood (ACCU-CHEK SMARTVIEW) test strip Use to check blood sugar 1-2 times daily 11/26/14   [name], MD  Lancets Misc. (ACCU-CHEK FASTCLIX LANCET) KIT Use to check blood sugar 1-2 times daily 11/26/14   [name], MD  orphenadrine (NORFLEX) 100 MG tablet Take 1 tablet (100 mg total) by mouth 2 (two) times daily. 09/13/15   [name], MD   BP 143/96 mmHg  Pulse 81  Temp(Src) 98.9 F (37.2 C) (Oral)  Resp 16  Ht 5' 3"  (1.6 m)  Wt 154 lb (69.854 kg)  BMI 27.29 kg/m2  SpO2 97% Physical Exam  Constitutional: She is oriented to person, place, and time. She appears well-developed and well-nourished.  HENT:  Head: Normocephalic and atraumatic.  Eyes: EOM are normal. Pupils are equal, round, and reactive to light.  Neck: Neck supple.  Cardiovascular: Normal rate, regular rhythm, normal heart sounds and intact distal pulses.   Pulmonary/Chest: Effort normal and breath sounds normal.  Abdominal: Soft. Bowel sounds are normal. She exhibits no distension. There is no tenderness.  Musculoskeletal: Normal range of motion. She exhibits no edema.  Neurological: She is alert  and oriented to person, place, and time. She has normal strength. No cranial nerve deficit. She exhibits normal muscle tone. Coordination normal. GCS eye subscore is 4. GCS verbal subscore is 5. GCS motor subscore is 6.  Skin: Skin is warm, dry and intact.  Psychiatric: She has a normal mood and affect.    ED Course  Procedures (including critical care time) Labs Review Labs Reviewed  BASIC METABOLIC PANEL - Abnormal; Notable for the following:    Glucose, Bld 101 (*)    Creatinine, Ser 1.27 (*)    GFR calc non Af Amer 44 (*)  GFR calc Af Amer 51 (*)    All other components within normal limits  CBC WITH DIFFERENTIAL/PLATELET  TROPONIN I    Imaging Review Dg Chest 2 View  09/13/2015  CLINICAL DATA:  Chest pain EXAM: CHEST  2 VIEW COMPARISON:  08/09/2015 FINDINGS: Normal heart size.  Clear lungs.  No pneumothorax. IMPRESSION: No active cardiopulmonary disease. Electronically Signed   By: [name] M.D.   On: 09/13/2015 19:30   Mr Brain Wo Contrast (neuro Protocol)  09/13/2015  CLINICAL DATA:  Initial evaluation for left arm and facial numbness. EXAM: MRI HEAD WITHOUT CONTRAST TECHNIQUE: Multiplanar, multiecho pulse sequences of the brain and surrounding structures were obtained without intravenous contrast. COMPARISON:  Prior CT from 07/28/2015. FINDINGS: Study is mildly degraded by motion artifact. Mild age-related cerebral volume loss is present. Minimal T2/FLAIR hyperintensity within the periventricular white matter likely related to chronic small vessel ischemic disease. No abnormal foci of restricted diffusion to suggest acute intracranial infarct. Gray-white matter differentiation maintained. Normal intravascular flow voids are preserved. No acute or chronic intracranial hemorrhage. No mass lesion, midline shift, or mass effect. No hydrocephalus. No extra-axial fluid collection. Craniocervical junction within normal limits. Minimal degenerative spondylolysis noted within the  visualized upper cervical spine without significant stenosis. Pituitary gland normal.  No acute abnormality about the orbits. Moderate mucosal thickening throughout the maxillary and ethmoidal sinuses. Left frontal sinus is completely opacified. No mastoid effusion. Inner ear structures normal. Bone marrow signal intensity within normal limits. No scalp soft tissue abnormality. IMPRESSION: 1. No acute intracranial infarct or other abnormality identified. 2. Mild atrophy with chronic small vessel ischemic disease. 3. Moderate paranasal sinus disease as above. Electronically Signed   By: Jeannine Boga M.D.   On: 09/13/2015 22:39   I have personally reviewed and evaluated these images and lab results as part of my medical decision-making.   EKG Interpretation   Date/Time:  Monday September 13 2015 18:19:03 EST Ventricular Rate:  88 PR Interval:  133 QRS Duration: 76 QT Interval:  413 QTC Calculation: 500 R Axis:   2 Text Interpretation:  Sinus rhythm Consider left ventricular hypertrophy  Borderline prolonged QT interval Confirmed by Johnney Killian, MD, Jeannie Done [phone])  on 09/13/2015 11:12:26 PM      MDM   Final diagnoses:  Paresthesia  Left shoulder pain  Left arm numbness   Patient presents with complaint of left sided chest pain and cervical pain. This has been persisting for several weeks. One of patient's concerns was elevated blood pressure at home. At this time there is no indication of significant elevation. There was no change in EKG and cardiac troponins are negative. At this time I do not have high suspicion for cardiac ischemic symptoms. Patient symptoms had some qualities consistent with radiculopathy with reproducible areas of muscular discomfort along the trapezius and the subscapularis. She did endorse however numbness in her arm and along the side of her face. Motor examination did not identify any abnormality. MRI brain does not show ischemic event. At this time I do feel  patient is safe to continue outpatient management with her family physician for further diagnostic workup is indicated and high suspicion for radicular\muscular skeletal pain.    [name], MD 09/13/15 [phone]

## 2015-09-13 NOTE — ED Notes (Signed)
Per EMS:  Pt presents to ED after speaking to her PCP today about her symptoms.  Pt has had elevated BP at home and intermittent left neck/chest pain.  Pt c/o nausea with no vomiting, denies SOB.  Pt has a hx of anxiety, bipolar and COPD.  Pt was given 324 ASA and 2 nitro with no improvement of pain en route.

## 2015-09-13 NOTE — Discharge Instructions (Signed)
Suspected Cervical Radiculopathy Cervical radiculopathy happens when a nerve in the neck (cervical nerve) is pinched or bruised. This condition can develop because of an injury or as part of the normal aging process. Pressure on the cervical nerves can cause pain or numbness that runs from the neck all the way down into the arm and fingers. Usually, this condition gets better with rest. Treatment may be needed if the condition does not improve.  CAUSES This condition may be caused by:  Injury.  Slipped (herniated) disk.  Muscle tightness in the neck because of overuse.  Arthritis.  Breakdown or degeneration in the bones and joints of the spine (spondylosis) due to aging.  Bone spurs that may develop near the cervical nerves. SYMPTOMS Symptoms of this condition include:  Pain that runs from the neck to the arm and hand. The pain can be severe or irritating. It may be worse when the neck is moved.  Numbness or weakness in the affected arm and hand. DIAGNOSIS This condition may be diagnosed based on symptoms, medical history, and a physical exam. You may also have tests, including:  X-rays.  CT scan.  MRI.  Electromyogram (EMG).  Nerve conduction tests. TREATMENT In many cases, treatment is not needed for this condition. With rest, the condition usually gets better over time. If treatment is needed, options may include:  Wearing a soft neck collar for short periods of time.  Physical therapy to strengthen your neck muscles.  Medicines, such as NSAIDs, oral corticosteroids, or spinal injections.  Surgery. This may be needed if other treatments do not help. Various types of surgery may be done depending on the cause of your problems. HOME CARE INSTRUCTIONS Managing Pain  Take over-the-counter and prescription medicines only as told by your health care provider.  If directed, apply ice to the affected area.  Put ice in a plastic bag.  Place a towel between your skin and  the bag.  Leave the ice on for 20 minutes, 2-3 times per day.  If ice does not help, you can try using heat. Take a warm shower or warm bath, or use a heat pack as told by your health care provider.  Try a gentle neck and shoulder massage to help relieve symptoms. Activity  Rest as needed. Follow instructions from your health care provider about any restrictions on activities.  Do stretching and strengthening exercises as told by your health care provider or physical therapist. General Instructions  If you were given a soft collar, wear it as told by your health care provider.  Use a flat pillow when you sleep.  Keep all follow-up visits as told by your health care provider. This is important. SEEK MEDICAL CARE IF:  Your condition does not improve with treatment. SEEK IMMEDIATE MEDICAL CARE IF:  Your pain gets much worse and cannot be controlled with medicines.  You have weakness or numbness in your hand, arm, face, or leg.  You have a high fever.  You have a stiff, rigid neck.  You lose control of your bowels or your bladder (have incontinence).  You have trouble with walking, balance, or speaking.   This information is not intended to replace advice given to you by your health care provider. Make sure you discuss any questions you have with your health care provider.   Document Released: 07/04/2001 Document Revised: 06/30/2015 Document Reviewed: 12/03/2014 Elsevier Interactive Patient Education 2016 Elsevier Inc[employer] Nonspecific Chest Pain  Chest pain can be caused by many different conditions.  There is always a chance that your pain could be related to something serious, such as a heart attack or a blood clot in your lungs. Chest pain can also be caused by conditions that are not life-threatening. If you have chest pain, it is very important to follow up with your health care provider. CAUSES  Chest pain can be caused by:  Heartburn.  Pneumonia or bronchitis.  Anxiety  or stress.  Inflammation around your heart (pericarditis) or lung (pleuritis or pleurisy).  A blood clot in your lung.  A collapsed lung (pneumothorax). It can develop suddenly on its own (spontaneous pneumothorax) or from trauma to the chest.  Shingles infection (varicella-zoster virus).  Heart attack.  Damage to the bones, muscles, and cartilage that make up your chest wall. This can include:  Bruised bones due to injury.  Strained muscles or cartilage due to frequent or repeated coughing or overwork.  Fracture to one or more ribs.  Sore cartilage due to inflammation (costochondritis). RISK FACTORS  Risk factors for chest pain may include:  Activities that increase your risk for trauma or injury to your chest.  Respiratory infections or conditions that cause frequent coughing.  Medical conditions or overeating that can cause heartburn.  Heart disease or family history of heart disease.  Conditions or health behaviors that increase your risk of developing a blood clot.  Having had chicken pox (varicella zoster). SIGNS AND SYMPTOMS Chest pain can feel like:  Burning or tingling on the surface of your chest or deep in your chest.  Crushing, pressure, aching, or squeezing pain.  Dull or sharp pain that is worse when you move, cough, or take a deep breath.  Pain that is also felt in your back, neck, shoulder, or arm, or pain that spreads to any of these areas. Your chest pain may come and go, or it may stay constant. DIAGNOSIS Lab tests or other studies may be needed to find the cause of your pain. Your health care provider may have you take a test called an ambulatory ECG (electrocardiogram). An ECG records your heartbeat patterns at the time the test is performed. You may also have other tests, such as:  Transthoracic echocardiogram (TTE). During echocardiography, sound waves are used to create a picture of all of the heart structures and to look at how blood flows  through your heart.  Transesophageal echocardiogram (TEE).This is a more advanced imaging test that obtains images from inside your body. It allows your health care provider to see your heart in finer detail.  Cardiac monitoring. This allows your health care provider to monitor your heart rate and rhythm in real time.  Holter monitor. This is a portable device that records your heartbeat and can help to diagnose abnormal heartbeats. It allows your health care provider to track your heart activity for several days, if needed.  Stress tests. These can be done through exercise or by taking medicine that makes your heart beat more quickly.  Blood tests.  Imaging tests. TREATMENT  Your treatment depends on what is causing your chest pain. Treatment may include:  Medicines. These may include:  Acid blockers for heartburn.  Anti-inflammatory medicine.  Pain medicine for inflammatory conditions.  Antibiotic medicine, if an infection is present.  Medicines to dissolve blood clots.  Medicines to treat coronary artery disease.  Supportive care for conditions that do not require medicines. This may include:  Resting.  Applying heat or cold packs to injured areas.  Limiting activities until pain decreases.  HOME CARE INSTRUCTIONS  If you were prescribed an antibiotic medicine, finish it all even if you start to feel better.  Avoid any activities that bring on chest pain.  Do not use any tobacco products, including cigarettes, chewing tobacco, or electronic cigarettes. If you need help quitting, ask your health care provider.  Do not drink alcohol.  Take medicines only as directed by your health care provider.  Keep all follow-up visits as directed by your health care provider. This is important. This includes any further testing if your chest pain does not go away.  If heartburn is the cause for your chest pain, you may be told to keep your head raised (elevated) while sleeping.  This reduces the chance that acid will go from your stomach into your esophagus.  Make lifestyle changes as directed by your health care provider. These may include:  Getting regular exercise. Ask your health care provider to suggest some activities that are safe for you.  Eating a heart-healthy diet. A registered dietitian can help you to learn healthy eating options.  Maintaining a healthy weight.  Managing diabetes, if necessary.  Reducing stress. SEEK MEDICAL CARE IF:  Your chest pain does not go away after treatment.  You have a rash with blisters on your chest.  You have a fever. SEEK IMMEDIATE MEDICAL CARE IF:   Your chest pain is worse.  You have an increasing cough, or you cough up blood.  You have severe abdominal pain.  You have severe weakness.  You faint.  You have chills.  You have sudden, unexplained chest discomfort.  You have sudden, unexplained discomfort in your arms, back, neck, or jaw.  You have shortness of breath at any time.  You suddenly start to sweat, or your skin gets clammy.  You feel nauseous or you vomit.  You suddenly feel light-headed or dizzy.  Your heart begins to beat quickly, or it feels like it is skipping beats. These symptoms may represent a serious problem that is an emergency. Do not wait to see if the symptoms will go away. Get medical help right away. Call your local emergency services (911 in the [place] Do not drive yourself to the hospital.   This information is not intended to replace advice given to you by your health care provider. Make sure you discuss any questions you have with your health care provider.   Document Released: 07/19/2005 Document Revised: 10/30/2014 Document Reviewed: 05/15/2014 Elsevier Interactive Patient Education [employer].

## 2015-09-14 ENCOUNTER — Ambulatory Visit: Payer: Commercial Managed Care - HMO | Admitting: Internal Medicine

## 2015-09-14 ENCOUNTER — Telehealth: Payer: Self-pay | Admitting: Internal Medicine

## 2015-09-14 NOTE — Telephone Encounter (Signed)
Pt is unable to come in on 09/21/15.  Where else can you see her?

## 2015-09-14 NOTE — Telephone Encounter (Signed)
Pt was discharge for [place] yesterday and needs post er follow up

## 2015-09-14 NOTE — Telephone Encounter (Signed)
?   345 on nov 30 ?

## 2015-09-15 NOTE — Telephone Encounter (Signed)
Pt accepted 09/22/15 @ 3:45.  Appointment made.

## 2015-09-20 ENCOUNTER — Ambulatory Visit: Payer: Self-pay | Admitting: Internal Medicine

## 2015-09-21 ENCOUNTER — Ambulatory Visit: Payer: Commercial Managed Care - HMO | Admitting: Internal Medicine

## 2015-09-21 DIAGNOSIS — K5900 Constipation, unspecified: Secondary | ICD-10-CM | POA: Diagnosis not present

## 2015-09-21 DIAGNOSIS — K219 Gastro-esophageal reflux disease without esophagitis: Secondary | ICD-10-CM | POA: Diagnosis not present

## 2015-09-21 DIAGNOSIS — R102 Pelvic and perineal pain: Secondary | ICD-10-CM | POA: Diagnosis not present

## 2015-09-22 ENCOUNTER — Ambulatory Visit (INDEPENDENT_AMBULATORY_CARE_PROVIDER_SITE_OTHER): Payer: Commercial Managed Care - HMO | Admitting: Internal Medicine

## 2015-09-22 ENCOUNTER — Ambulatory Visit: Payer: Commercial Managed Care - HMO | Admitting: Internal Medicine

## 2015-09-22 ENCOUNTER — Encounter: Payer: Self-pay | Admitting: Internal Medicine

## 2015-09-22 VITALS — BP 130/68 | Temp 98.8°F | Ht 63.0 in | Wt 156.6 lb

## 2015-09-22 DIAGNOSIS — J329 Chronic sinusitis, unspecified: Secondary | ICD-10-CM

## 2015-09-22 DIAGNOSIS — J349 Unspecified disorder of nose and nasal sinuses: Secondary | ICD-10-CM

## 2015-09-22 DIAGNOSIS — I10 Essential (primary) hypertension: Secondary | ICD-10-CM

## 2015-09-22 DIAGNOSIS — R42 Dizziness and giddiness: Secondary | ICD-10-CM

## 2015-09-22 DIAGNOSIS — M542 Cervicalgia: Secondary | ICD-10-CM

## 2015-09-22 MED ORDER — LEVOFLOXACIN 500 MG PO TABS: 500.0000 mg | ORAL_TABLET | Freq: Every day | ORAL | Status: DC
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
## 2015-09-22 NOTE — Patient Instructions (Signed)
  Arranging ENT consult about the sinus problem and abnormality on mri and  Ct scan. Possible could cause diszziness but not sure.  Take  Antibiotic for possible residual sinusitis   Also  The left arm pain neck paoin and numbness could be from  Arthritis in the neck and cionsideration of seeing dr Ophelia Charter or spine  Person  Evaluation.

## 2015-09-24 DIAGNOSIS — J342 Deviated nasal septum: Secondary | ICD-10-CM | POA: Diagnosis not present

## 2015-09-24 DIAGNOSIS — J328 Other chronic sinusitis: Secondary | ICD-10-CM | POA: Diagnosis not present

## 2015-09-27 DIAGNOSIS — N2581 Secondary hyperparathyroidism of renal origin: Secondary | ICD-10-CM | POA: Diagnosis not present

## 2015-09-27 DIAGNOSIS — N189 Chronic kidney disease, unspecified: Secondary | ICD-10-CM | POA: Diagnosis not present

## 2015-10-04 ENCOUNTER — Telehealth: Payer: Self-pay | Admitting: Internal Medicine

## 2015-10-04 NOTE — Telephone Encounter (Signed)
Ms. Underdown called to inquire on how she can get information on how to receive a lifeline necklace or bracelet. Please give her a call regarding this.  Pt's ph# [phone] Thank you.

## 2015-10-04 NOTE — Telephone Encounter (Signed)
Spoke to the pt.  Advised that Dr. Fabian Sharp does not prescribe LifeAlert Necklaces.  Gave her the telephone number to call off the website ([phone]).

## 2015-10-06 ENCOUNTER — Other Ambulatory Visit: Payer: Self-pay | Admitting: Internal Medicine

## 2015-10-06 NOTE — Telephone Encounter (Signed)
Sent to the pharmacy by e-scribe. 

## 2015-10-07 DIAGNOSIS — D631 Anemia in chronic kidney disease: Secondary | ICD-10-CM | POA: Diagnosis not present

## 2015-10-07 DIAGNOSIS — N189 Chronic kidney disease, unspecified: Secondary | ICD-10-CM | POA: Diagnosis not present

## 2015-10-07 DIAGNOSIS — N2581 Secondary hyperparathyroidism of renal origin: Secondary | ICD-10-CM | POA: Diagnosis not present

## 2015-10-07 DIAGNOSIS — I129 Hypertensive chronic kidney disease with stage 1 through stage 4 chronic kidney disease, or unspecified chronic kidney disease: Secondary | ICD-10-CM | POA: Diagnosis not present

## 2015-10-28 ENCOUNTER — Telehealth: Payer: Self-pay | Admitting: Internal Medicine

## 2015-10-28 NOTE — Telephone Encounter (Signed)
Pt is sch for [facility] labs in mar 2017. Pt would like to have HIV test and HEP C screening.

## 2015-10-28 NOTE — Telephone Encounter (Signed)
Spoke to the pt.  Informed her that she had hep c and hiv screen on 01/01/15.  While on the phone she stated that she continues to have dizziness, fatigue, headache and pain on the left side of her neck.  Said she has talked to Dr. Fabian Sharp about this in the past.  Unsure of what to do. Does she need to come back in?  Wanted WP to know she has vomited twice due to the dizziness.  BP last night was 150s/84.  Advised that she continue to monitor her bp and keep a record.  Will forward to [facility] for further advisement.  Pt notified WP out of the office due to half day.

## 2015-10-28 NOTE — Telephone Encounter (Signed)
Did she every see ent doctor we referred last visit ( for the headache sinus problem)?  Could be related to dizziness.  need note for review.   If ongoing may get see  Neuro to see her .  OV   after i review the notes  Or if worse.

## 2015-10-29 NOTE — Telephone Encounter (Signed)
Spoke to the pt.  She seen Dr. Ezzard Standing.  She stated that he prescribed fluticasone nasal spray 50 mcg and an antibiotic.  She could not remember the name of the antibiotic.  Told to come back as needed.  She continues to have pain under her left breast.  Wanted WP to be aware.  BP today (taken around noon) was 129/79.  Last night was 161/92 (7:24 PM).  Please advise.  Thanks!

## 2015-10-29 NOTE — Telephone Encounter (Signed)
Left a message for a return call.

## 2015-10-29 NOTE — Telephone Encounter (Signed)
Pt notified to see Dr. Ezzard Standing if problem persists and then come in for OV She will continue to monitor bp and keep a record.

## 2015-10-29 NOTE — Telephone Encounter (Signed)
No change in  meds for now just  Monitor bp  As you are doing  .    if getting same headache  Dizzy still advise go back and see if he thinks any of your sx are from sinuses. Then can rov with me if ongoing

## 2015-11-03 DIAGNOSIS — L812 Freckles: Secondary | ICD-10-CM | POA: Diagnosis not present

## 2015-11-03 DIAGNOSIS — L668 Other cicatricial alopecia: Secondary | ICD-10-CM | POA: Diagnosis not present

## 2015-11-25 ENCOUNTER — Encounter: Payer: Self-pay | Admitting: Family Medicine

## 2015-12-03 ENCOUNTER — Other Ambulatory Visit: Payer: Self-pay | Admitting: Internal Medicine

## 2015-12-27 ENCOUNTER — Other Ambulatory Visit: Payer: Self-pay | Admitting: Internal Medicine

## 2015-12-28 NOTE — Telephone Encounter (Signed)
Sent to the pharmacy by e-scribe. 

## 2015-12-31 ENCOUNTER — Other Ambulatory Visit (INDEPENDENT_AMBULATORY_CARE_PROVIDER_SITE_OTHER): Payer: Commercial Managed Care - HMO

## 2015-12-31 DIAGNOSIS — E785 Hyperlipidemia, unspecified: Secondary | ICD-10-CM

## 2015-12-31 DIAGNOSIS — Z1159 Encounter for screening for other viral diseases: Secondary | ICD-10-CM | POA: Diagnosis not present

## 2015-12-31 DIAGNOSIS — N289 Disorder of kidney and ureter, unspecified: Secondary | ICD-10-CM | POA: Diagnosis not present

## 2015-12-31 DIAGNOSIS — I10 Essential (primary) hypertension: Secondary | ICD-10-CM | POA: Diagnosis not present

## 2015-12-31 DIAGNOSIS — E1129 Type 2 diabetes mellitus with other diabetic kidney complication: Secondary | ICD-10-CM | POA: Diagnosis not present

## 2015-12-31 LAB — CBC WITH DIFFERENTIAL/PLATELET
Basophils Absolute: 0 10*3/uL (ref 0.0–0.1)
Basophils Relative: 0.6 % (ref 0.0–3.0)
Eosinophils Absolute: 0.4 10*3/uL (ref 0.0–0.7)
Eosinophils Relative: 5.7 % — ABNORMAL HIGH (ref 0.0–5.0)
HCT: 38.8 % (ref 36.0–46.0)
Hemoglobin: 12.7 g/dL (ref 12.0–15.0)
Lymphocytes Relative: 49.1 % — ABNORMAL HIGH (ref 12.0–46.0)
Lymphs Abs: 3.4 10*3/uL (ref 0.7–4.0)
MCHC: 32.8 g/dL (ref 30.0–36.0)
MCV: 90.6 fl (ref 78.0–100.0)
Monocytes Absolute: 0.5 10*3/uL (ref 0.1–1.0)
Monocytes Relative: 7.3 % (ref 3.0–12.0)
Neutro Abs: 2.6 10*3/uL (ref 1.4–7.7)
Neutrophils Relative %: 37.3 % — ABNORMAL LOW (ref 43.0–77.0)
Platelets: 351 10*3/uL (ref 150.0–400.0)
RBC: 4.28 Mil/uL (ref 3.87–5.11)
RDW: 14.7 % (ref 11.5–15.5)
WBC: 6.9 10*3/uL (ref 4.0–10.5)

## 2015-12-31 LAB — LIPID PANEL
Cholesterol: 149 mg/dL (ref 0–200)
HDL: 48.1 mg/dL (ref >39.00)
LDL Cholesterol: 85 mg/dL (ref 0–99)
NonHDL: 100.46
Total CHOL/HDL Ratio: 3
Triglycerides: 75 mg/dL (ref 0.0–149.0)
VLDL: 15 mg/dL (ref 0.0–40.0)

## 2015-12-31 LAB — HEPATIC FUNCTION PANEL
ALT: 13 U/L (ref 0–35)
AST: 21 U/L (ref 0–37)
Albumin: 4.2 g/dL (ref 3.5–5.2)
Alkaline Phosphatase: 75 U/L (ref 39–117)
Bilirubin, Direct: 0.1 mg/dL (ref 0.0–0.3)
Total Bilirubin: 0.4 mg/dL (ref 0.2–1.2)
Total Protein: 7.5 g/dL (ref 6.0–8.3)

## 2015-12-31 LAB — TSH: TSH: 1.72 u[IU]/mL (ref 0.35–4.50)

## 2015-12-31 LAB — BASIC METABOLIC PANEL
BUN: 20 mg/dL (ref 6–23)
CO2: 30 mEq/L (ref 19–32)
Calcium: 9.4 mg/dL (ref 8.4–10.5)
Chloride: 104 mEq/L (ref 96–112)
Creatinine, Ser: 1.3 mg/dL — ABNORMAL HIGH (ref 0.40–1.20)
GFR: 53.08 mL/min — ABNORMAL LOW (ref >60.00)
Glucose, Bld: 105 mg/dL — ABNORMAL HIGH (ref 70–99)
Potassium: 3.7 mEq/L (ref 3.5–5.1)
Sodium: 142 mEq/L (ref 135–145)

## 2015-12-31 LAB — HEMOGLOBIN A1C: Hgb A1c MFr Bld: 6.1 % (ref 4.6–6.5)

## 2015-12-31 LAB — MICROALBUMIN / CREATININE URINE RATIO
Creatinine,U: 298.4 mg/dL
Microalb Creat Ratio: 1 mg/g (ref 0.0–30.0)
Microalb, Ur: 3.1 mg/dL — ABNORMAL HIGH (ref 0.0–1.9)

## 2016-01-01 LAB — HEPATITIS C ANTIBODY: HCV Ab: NEGATIVE

## 2016-01-03 ENCOUNTER — Encounter: Payer: Self-pay | Admitting: Internal Medicine

## 2016-01-03 ENCOUNTER — Telehealth: Payer: Self-pay | Admitting: Internal Medicine

## 2016-01-03 NOTE — Telephone Encounter (Signed)
Pt would like results of her labs done last week, especially the hep c test

## 2016-01-04 NOTE — Telephone Encounter (Signed)
Spoke to the pt.  Informed her that hep c screen in neg.  Will discuss other lab results at [facility] on 01/12/16

## 2016-01-07 ENCOUNTER — Encounter: Payer: Self-pay | Admitting: Internal Medicine

## 2016-01-12 ENCOUNTER — Ambulatory Visit (INDEPENDENT_AMBULATORY_CARE_PROVIDER_SITE_OTHER): Payer: Commercial Managed Care - HMO | Admitting: Internal Medicine

## 2016-01-12 VITALS — BP 124/70 | Temp 98.7°F | Ht 62.75 in | Wt 150.7 lb

## 2016-01-12 DIAGNOSIS — Z0000 Encounter for general adult medical examination without abnormal findings: Secondary | ICD-10-CM | POA: Diagnosis not present

## 2016-01-12 DIAGNOSIS — E1122 Type 2 diabetes mellitus with diabetic chronic kidney disease: Secondary | ICD-10-CM

## 2016-01-12 DIAGNOSIS — N289 Disorder of kidney and ureter, unspecified: Secondary | ICD-10-CM

## 2016-01-12 DIAGNOSIS — I10 Essential (primary) hypertension: Secondary | ICD-10-CM | POA: Diagnosis not present

## 2016-01-12 DIAGNOSIS — E785 Hyperlipidemia, unspecified: Secondary | ICD-10-CM

## 2016-01-12 DIAGNOSIS — Z79899 Other long term (current) drug therapy: Secondary | ICD-10-CM

## 2016-01-12 NOTE — Patient Instructions (Addendum)
contniue healthy lifestyle limit sugars .sweets . Get mammogram  Can call the breast center , [facility] or you [facility] .    Your diabetes is in excellent control  See foot center if having problems with the callus on the bottom of foot.   Health Maintenance, Female Adopting a healthy lifestyle and getting preventive care can go a long way to promote health and wellness. Talk with your health care provider about what schedule of regular examinations is right for you. This is a good chance for you to check in with your provider about disease prevention and staying healthy. In between checkups, there are plenty of things you can do on your own. Experts have done a lot of research about which lifestyle changes and preventive measures are most likely to keep you healthy. Ask your health care provider for more information. WEIGHT AND DIET  Eat a healthy diet  Be sure to include plenty of vegetables, fruits, low-fat dairy products, and lean protein.  Do not eat a lot of foods high in solid fats, added sugars, or salt.  Get regular exercise. This is one of the most important things you can do for your health.  Most adults should exercise for at least 150 minutes each week. The exercise should increase your heart rate and make you sweat (moderate-intensity exercise).  Most adults should also do strengthening exercises at least twice a week. This is in addition to the moderate-intensity exercise.  Maintain a healthy weight  Body mass index (BMI) is a measurement that can be used to identify possible weight problems. It estimates body fat based on height and weight. Your health care provider can help determine your BMI and help you achieve or maintain a healthy weight.  For females 55 years of age and older:   A BMI below 18.5 is considered underweight.  A BMI of 18.5 to 24.9 is normal.  A BMI of 25 to 29.9 is considered overweight.  A BMI of 30 and above is considered obese.   Watch levels of cholesterol and blood lipids  You should start having your blood tested for lipids and cholesterol at 64 years of age, then have this test every 5 years.  You may need to have your cholesterol levels checked more often if:  Your lipid or cholesterol levels are high.  You are older than 64 years of age.  You are at high risk for heart disease.  CANCER SCREENING   Lung Cancer  Lung cancer screening is recommended for adults 60-2 years old who are at high risk for lung cancer because of a history of smoking.  A yearly low-dose CT scan of the lungs is recommended for people who:  Currently smoke.  Have quit within the past 15 years.  Have at least a 30-pack-year history of smoking. A pack year is smoking an average of one pack of cigarettes a day for 1 year.  Yearly screening should continue until it has been 15 years since you quit.  Yearly screening should stop if you develop a health problem that would prevent you from having lung cancer treatment.  Breast Cancer  Practice breast self-awareness. This means understanding how your breasts normally appear and feel.  It also means doing regular breast self-exams. Let your health care provider know about any changes, no matter how small.  If you are in your 20s or 30s, you should have a clinical breast exam (CBE) by a health care provider every 1-3 years  as part of a regular health exam.  If you are 27 or older, have a CBE every year. Also consider having a breast X-ray (mammogram) every year.  If you have a family history of breast cancer, talk to your health care provider about genetic screening.  If you are at high risk for breast cancer, talk to your health care provider about having an MRI and a mammogram every year.  Breast cancer gene (BRCA) assessment is recommended for women who have family members with BRCA-related cancers. BRCA-related cancers  include:  Breast.  Ovarian.  Tubal.  Peritoneal cancers.  Results of the assessment will determine the need for genetic counseling and BRCA1 and BRCA2 testing. Cervical Cancer Your health care provider may recommend that you be screened regularly for cancer of the pelvic organs (ovaries, uterus, and vagina). This screening involves a pelvic examination, including checking for microscopic changes to the surface of your cervix (Pap test). You may be encouraged to have this screening done every 3 years, beginning at age 77.  For women ages 17-65, health care providers may recommend pelvic exams and Pap testing every 3 years, or they may recommend the Pap and pelvic exam, combined with testing for human papilloma virus (HPV), every 5 years. Some types of HPV increase your risk of cervical cancer. Testing for HPV may also be done on women of any age with unclear Pap test results.  Other health care providers may not recommend any screening for nonpregnant women who are considered low risk for pelvic cancer and who do not have symptoms. Ask your health care provider if a screening pelvic exam is right for you.  If you have had past treatment for cervical cancer or a condition that could lead to cancer, you need Pap tests and screening for cancer for at least 20 years after your treatment. If Pap tests have been discontinued, your risk factors (such as having a new sexual partner) need to be reassessed to determine if screening should resume. Some women have medical problems that increase the chance of getting cervical cancer. In these cases, your health care provider may recommend more frequent screening and Pap tests. Colorectal Cancer  This type of cancer can be detected and often prevented.  Routine colorectal cancer screening usually begins at 65 years of age and continues through 64 years of age.  Your health care provider may recommend screening at an earlier age if you have risk factors for  colon cancer.  Your health care provider may also recommend using home test kits to check for hidden blood in the stool.  A small camera at the end of a tube can be used to examine your colon directly (sigmoidoscopy or colonoscopy). This is done to check for the earliest forms of colorectal cancer.  Routine screening usually begins at age 7.  Direct examination of the colon should be repeated every 5-10 years through 64 years of age. However, you may need to be screened more often if early forms of precancerous polyps or small growths are found. Skin Cancer  Check your skin from head to toe regularly.  Tell your health care provider about any new moles or changes in moles, especially if there is a change in a mole's shape or color.  Also tell your health care provider if you have a mole that is larger than the size of a pencil eraser.  Always use sunscreen. Apply sunscreen liberally and repeatedly throughout the day.  Protect yourself by wearing long sleeves,  pants, a wide-brimmed hat, and sunglasses whenever you are outside. HEART DISEASE, DIABETES, AND HIGH BLOOD PRESSURE   High blood pressure causes heart disease and increases the risk of stroke. High blood pressure is more likely to develop in:  People who have blood pressure in the high end of the normal range (130-139/85-89 mm Hg).  People who are overweight or obese.  People who are African American.  If you are 24-66 years of age, have your blood pressure checked every 3-5 years. If you are 81 years of age or older, have your blood pressure checked every year. You should have your blood pressure measured twice--once when you are at a hospital or clinic, and once when you are not at a hospital or clinic. Record the average of the two measurements. To check your blood pressure when you are not at a hospital or clinic, you can use:  An automated blood pressure machine at a pharmacy.  A home blood pressure monitor.  If you  are between 16 years and 28 years old, ask your health care provider if you should take aspirin to prevent strokes.  Have regular diabetes screenings. This involves taking a blood sample to check your fasting blood sugar level.  If you are at a normal weight and have a low risk for diabetes, have this test once every three years after 64 years of age.  If you are overweight and have a high risk for diabetes, consider being tested at a younger age or more often. PREVENTING INFECTION  Hepatitis B  If you have a higher risk for hepatitis B, you should be screened for this virus. You are considered at high risk for hepatitis B if:  You were born in a country where hepatitis B is common. Ask your health care provider which countries are considered high risk.  Your parents were born in a high-risk country, and you have not been immunized against hepatitis B (hepatitis B vaccine).  You have HIV or AIDS.  You use needles to inject street drugs.  You live with someone who has hepatitis B.  You have had sex with someone who has hepatitis B.  You get hemodialysis treatment.  You take certain medicines for conditions, including cancer, organ transplantation, and autoimmune conditions. Hepatitis C  Blood testing is recommended for:  Everyone born from [birth year] through 1964.  Anyone with known risk factors for hepatitis C. Sexually transmitted infections (STIs)  You should be screened for sexually transmitted infections (STIs) including gonorrhea and chlamydia if:  You are sexually active and are younger than 63 years of age.  You are older than 64 years of age and your health care provider tells you that you are at risk for this type of infection.  Your sexual activity has changed since you were last screened and you are at an increased risk for chlamydia or gonorrhea. Ask your health care provider if you are at risk.  If you do not have HIV, but are at risk, it may be recommended that you  take a prescription medicine daily to prevent HIV infection. This is called pre-exposure prophylaxis (PrEP). You are considered at risk if:  You are sexually active and do not regularly use condoms or know the HIV status of your partner(s).  You take drugs by injection.  You are sexually active with a partner who has HIV. Talk with your health care provider about whether you are at high risk of being infected with HIV. If you choose to  begin PrEP, you should first be tested for HIV. You should then be tested every 3 months for as long as you are taking PrEP.  PREGNANCY   If you are premenopausal and you may become pregnant, ask your health care provider about preconception counseling.  If you may become pregnant, take 400 to 800 micrograms (mcg) of folic acid every day.  If you want to prevent pregnancy, talk to your health care provider about birth control (contraception). OSTEOPOROSIS AND MENOPAUSE   Osteoporosis is a disease in which the bones lose minerals and strength with aging. This can result in serious bone fractures. Your risk for osteoporosis can be identified using a bone density scan.  If you are 33 years of age or older, or if you are at risk for osteoporosis and fractures, ask your health care provider if you should be screened.  Ask your health care provider whether you should take a calcium or vitamin D supplement to lower your risk for osteoporosis.  Menopause may have certain physical symptoms and risks.  Hormone replacement therapy may reduce some of these symptoms and risks. Talk to your health care provider about whether hormone replacement therapy is right for you.  HOME CARE INSTRUCTIONS   Schedule regular health, dental, and eye exams.  Stay current with your immunizations.   Do not use any tobacco products including cigarettes, chewing tobacco, or electronic cigarettes.  If you are pregnant, do not drink alcohol.  If you are breastfeeding, limit how  much and how often you drink alcohol.  Limit alcohol intake to no more than 1 drink per day for nonpregnant women. One drink equals 12 ounces of beer, 5 ounces of wine, or 1 ounces of hard liquor.  Do not use street drugs.  Do not share needles.  Ask your health care provider for help if you need support or information about quitting drugs.  Tell your health care provider if you often feel depressed.  Tell your health care provider if you have ever been abused or do not feel safe at home.   This information is not intended to replace advice given to you by your health care provider. Make sure you discuss any questions you have with your health care provider.   Document Released: 04/24/2011 Document Revised: 10/30/2014 Document Reviewed: 09/10/2013 Elsevier Interactive Patient Education 2016 [employer]   Chronic Kidney Disease Chronic kidney disease occurs when the kidneys are damaged over a long period. The kidneys are two organs that lie on either side of the spine between the middle of the back and the front of the abdomen. The kidneys:  Remove wastes and extra water from the blood.  Produce important hormones. These help keep bones strong, regulate blood pressure, and help create red blood cells.  Balance the fluids and chemicals in the blood and tissues. A small amount of kidney damage may not cause problems, but a large amount of damage may make it difficult or impossible for the kidneys to work the way they should. If steps are not taken to slow down the kidney damage or stop it from getting worse, the kidneys may stop working permanently. Most of the time, chronic kidney disease does not go away. However, it can often be controlled, and those with the disease can usually live normal lives. CAUSES The most common causes of chronic kidney disease are diabetes and high blood pressure (hypertension). Chronic kidney disease may also be caused by:  Diseases that cause the  kidneys' filters to  become inflamed.  Diseases that affect the immune system.  Genetic diseases.  Medicines that damage the kidneys, such as anti-inflammatory medicines.  Poisoning or exposure to toxic substances.  A reoccurring kidney or urinary infection.  A problem with urine flow. This may be caused by:  Cancer.  Kidney stones.  An enlarged prostate in males. SIGNS AND SYMPTOMS Because the kidney damage in chronic kidney disease occurs slowly, symptoms develop slowly and may not be obvious until the kidney damage becomes severe. A person may have a kidney disease for years without showing any symptoms. Symptoms can include:  Swelling (edema) of the legs, ankles, or feet.  Tiredness (lethargy).  Nausea or vomiting.  Confusion.  Problems with urination, such as:  Decreased urine production.  Frequent urination, especially at night.  Frequent accidents in children who are potty trained.  Muscle twitches and cramps.  Shortness of breath.  Weakness.  Persistent itchiness.  Loss of appetite.  Metallic taste in the mouth.  Trouble sleeping.  Slowed development in children.  Short stature in children. DIAGNOSIS Chronic kidney disease may be detected and diagnosed by tests, including blood, urine, imaging, or kidney biopsy tests. TREATMENT Most chronic kidney diseases cannot be cured. Treatment usually involves relieving symptoms and preventing or slowing the progression of the disease. Treatment may include:  A special diet. You may need to avoid alcohol and foods thatare salty and high in potassium.  Medicines. These may:  Lower blood pressure.  Relieve anemia.  Relieve swelling.  Protect the bones. HOME CARE INSTRUCTIONS  Follow your prescribed diet. Your health care provider may instruct you to limit daily salt (sodium) and protein intake.  Take medicines only as directed by your health care provider. Do not take any new medicines  (prescription, over-the-counter, or nutritional supplements) unless approved by your health care provider. Many medicines can worsen your kidney damage or need to have the dose adjusted.   Quit smoking if you smoke. Talk to your health care provider about a smoking cessation program.  Keep all follow-up visits as directed by your health care provider.  Monitor your blood pressure.  Start or continue an exercise plan.  Get immunizations as directed by your health care provider.  Take vitamin and mineral supplements as directed by your health care provider. SEEK IMMEDIATE MEDICAL CARE IF:  Your symptoms get worse or you develop new symptoms.  You develop symptoms of end-stage kidney disease. These include:  Headaches.  Abnormally dark or light skin.  Numbness in the hands or feet.  Easy bruising.  Frequent hiccups.  Menstruation stops.  You have a fever.  You have decreased urine production.  You havepain or bleeding when urinating. MAKE SURE YOU:  Understand these instructions.  Will watch your condition.  Will get help right away if you are not doing well or get worse. FOR MORE INFORMATION   American Association of Kidney Patients: BombTimer.gl  National Kidney Foundation: www.kidney.[place]: https://mathis.com/  [facility]: www.lifeoptions.org and www.kidneyschool.org   This information is not intended to replace advice given to you by your health care provider. Make sure you discuss any questions you have with your health care provider.   Document Released: 07/18/2008 Document Revised: 10/30/2014 Document Reviewed: 06/07/2012 Elsevier Interactive Patient Education [employer].

## 2016-01-12 NOTE — Progress Notes (Signed)
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 toxic substances.  A reoccurring kidney or urinary infection.  A problem with urine flow. This may be caused by:  Cancer.  Kidney stones.  An enlarged prostate in males. SIGNS AND SYMPTOMS Because the kidney damage in chronic kidney disease occurs slowly, symptoms develop slowly and may not be obvious until the kidney damage becomes severe. A person may have a kidney disease for years without showing any symptoms. Symptoms can include:  Swelling (edema) of the legs, ankles, or feet.  Tiredness (lethargy).  Nausea or vomiting.  Confusion.  Problems with urination, such as:  Decreased urine production.  Frequent urination, especially at night.  Frequent accidents in children who are potty trained.  Muscle twitches and cramps.  Shortness of breath.  Weakness.  Persistent itchiness.  Loss of appetite.  Metallic taste in the mouth.  Trouble  sleeping.  Slowed development in children.  Short stature in children. DIAGNOSIS Chronic kidney disease may be detected and diagnosed by tests, including blood, urine, imaging, or kidney biopsy tests. TREATMENT Most chronic kidney diseases cannot be cured. Treatment usually involves relieving symptoms and preventing or slowing the progression of the disease. Treatment may include:  A special diet. You may need to avoid alcohol and foods thatare salty and high in potassium.  Medicines. These may:  Lower blood pressure.  Relieve anemia.  Relieve swelling.  Protect the bones. HOME CARE INSTRUCTIONS  Follow your prescribed diet. Your health care provider may instruct you to limit daily salt (sodium) and protein intake.  Take medicines only as directed by your health care provider. Do not take any new medicines (prescription, over-the-counter, or nutritional supplements) unless approved by your health care provider. Many medicines can worsen your kidney damage or need to have the dose adjusted.   Quit smoking if you smoke. Talk to your health care provider about a smoking cessation program.  Keep all follow-up visits as directed by your health care provider.  Monitor your blood pressure.  Start or continue an exercise plan.  Get immunizations as directed by your health care provider.  Take vitamin and mineral supplements as directed by your health care provider. SEEK IMMEDIATE MEDICAL CARE IF:  Your symptoms get worse or you develop new symptoms.  You develop symptoms of end-stage kidney disease. These include:  Headaches.  Abnormally dark or light skin.  Numbness in the hands  or feet.  Easy bruising.  Frequent hiccups.  Menstruation stops.  You have a fever.  You have decreased urine production.  You havepain or bleeding when urinating. MAKE SURE YOU:  Understand these instructions.  Will watch your condition.  Will get help right away if you are not  doing well or get worse. FOR MORE INFORMATION   American Association of Kidney Patients: BombTimer.gl  National Kidney Foundation: www.kidney.[place]: https://mathis.com/  [facility]: www.lifeoptions.org and www.kidneyschool.org   This information is not intended to replace advice given to you by your health care provider. Make sure you discuss any questions you have with your health care provider.   Document Released: 07/18/2008 Document Revised: 10/30/2014 Document Reviewed: 06/07/2012 Elsevier Interactive Patient Education 2016 [place] K. [name] M.D.

## 2016-01-16 ENCOUNTER — Encounter: Payer: Self-pay | Admitting: Internal Medicine

## 2016-01-18 ENCOUNTER — Other Ambulatory Visit: Payer: Self-pay

## 2016-01-18 DIAGNOSIS — Z1231 Encounter for screening mammogram for malignant neoplasm of breast: Secondary | ICD-10-CM

## 2016-01-20 ENCOUNTER — Telehealth: Payer: Self-pay | Admitting: Internal Medicine

## 2016-01-20 DIAGNOSIS — Z7689 Persons encountering health services in other specified circumstances: Secondary | ICD-10-CM

## 2016-01-20 NOTE — Telephone Encounter (Signed)
Pt call to ask for a referral to see Dr Arelia Sneddon GYN  PAP SMEAR

## 2016-01-24 NOTE — Telephone Encounter (Signed)
Referral placed in the system. 

## 2016-02-07 ENCOUNTER — Ambulatory Visit: Payer: Commercial Managed Care - HMO

## 2016-03-01 ENCOUNTER — Other Ambulatory Visit: Payer: Self-pay | Admitting: Internal Medicine

## 2016-03-01 MED ORDER — ACCU-CHEK FASTCLIX LANCETS MISC: Status: DC

## 2016-03-01 NOTE — Telephone Encounter (Signed)
Lancets sent to the pharmacy by e-scribe.

## 2016-03-12 ENCOUNTER — Other Ambulatory Visit: Payer: Self-pay | Admitting: Internal Medicine

## 2016-03-13 NOTE — Telephone Encounter (Signed)
Sent to the pharmacy by e-scribe.  Pt has upcoming appt on 07/19/16

## 2016-03-28 DIAGNOSIS — Z1231 Encounter for screening mammogram for malignant neoplasm of breast: Secondary | ICD-10-CM | POA: Diagnosis not present

## 2016-03-28 DIAGNOSIS — N958 Other specified menopausal and perimenopausal disorders: Secondary | ICD-10-CM | POA: Diagnosis not present

## 2016-03-28 DIAGNOSIS — Z6827 Body mass index (BMI) 27.0-27.9, adult: Secondary | ICD-10-CM | POA: Diagnosis not present

## 2016-03-28 DIAGNOSIS — Z124 Encounter for screening for malignant neoplasm of cervix: Secondary | ICD-10-CM | POA: Diagnosis not present

## 2016-04-16 ENCOUNTER — Other Ambulatory Visit: Payer: Self-pay | Admitting: Internal Medicine

## 2016-04-18 DIAGNOSIS — F314 Bipolar disorder, current episode depressed, severe, without psychotic features: Secondary | ICD-10-CM | POA: Diagnosis not present

## 2016-04-19 ENCOUNTER — Telehealth: Payer: Self-pay | Admitting: Internal Medicine

## 2016-04-19 MED ORDER — ATORVASTATIN CALCIUM 20 MG PO TABS: 20.0000 mg | ORAL_TABLET | Freq: Every day | ORAL | Status: DC

## 2016-04-19 NOTE — Telephone Encounter (Signed)
Sent to the pharmacy by e-scribe. 

## 2016-04-19 NOTE — Telephone Encounter (Signed)
Patient said the prescription for her atorvastatin (LIPITOR) 20 MG tablet [[id number]] was sent to her [facility] pharmacy and they do 90 day prescriptions but her prescription was sent in for 30 days.  She does not want to wait for the medication to go thru that pharmacy again.  So she wants to know if it can be sent to [facility] on [address] [facility] for a 30day supply.

## 2016-04-25 ENCOUNTER — Encounter (HOSPITAL_COMMUNITY): Payer: Self-pay | Admitting: Emergency Medicine

## 2016-04-25 ENCOUNTER — Emergency Department (HOSPITAL_COMMUNITY): Payer: Commercial Managed Care - HMO

## 2016-04-25 ENCOUNTER — Emergency Department (HOSPITAL_COMMUNITY)
Admission: EM | Admit: 2016-04-25 | Discharge: 2016-04-26 | Disposition: A | Discharge status: Home / Self Care | Payer: Commercial Managed Care - HMO | Attending: Emergency Medicine | Admitting: Emergency Medicine

## 2016-04-25 DIAGNOSIS — E119 Type 2 diabetes mellitus without complications: Secondary | ICD-10-CM | POA: Insufficient documentation

## 2016-04-25 DIAGNOSIS — Z79899 Other long term (current) drug therapy: Secondary | ICD-10-CM | POA: Insufficient documentation

## 2016-04-25 DIAGNOSIS — F319 Bipolar disorder, unspecified: Secondary | ICD-10-CM | POA: Diagnosis not present

## 2016-04-25 DIAGNOSIS — T22232A Burn of second degree of left upper arm, initial encounter: Secondary | ICD-10-CM | POA: Diagnosis not present

## 2016-04-25 DIAGNOSIS — Y999 Unspecified external cause status: Secondary | ICD-10-CM | POA: Insufficient documentation

## 2016-04-25 DIAGNOSIS — W39XXXA Discharge of firework, initial encounter: Secondary | ICD-10-CM | POA: Insufficient documentation

## 2016-04-25 DIAGNOSIS — S6992XA Unspecified injury of left wrist, hand and finger(s), initial encounter: Secondary | ICD-10-CM | POA: Diagnosis not present

## 2016-04-25 DIAGNOSIS — E785 Hyperlipidemia, unspecified: Secondary | ICD-10-CM | POA: Insufficient documentation

## 2016-04-25 DIAGNOSIS — Y929 Unspecified place or not applicable: Secondary | ICD-10-CM | POA: Insufficient documentation

## 2016-04-25 DIAGNOSIS — T310 Burns involving less than 10% of body surface: Secondary | ICD-10-CM | POA: Diagnosis not present

## 2016-04-25 DIAGNOSIS — M7989 Other specified soft tissue disorders: Secondary | ICD-10-CM | POA: Diagnosis not present

## 2016-04-25 DIAGNOSIS — Y939 Activity, unspecified: Secondary | ICD-10-CM | POA: Diagnosis not present

## 2016-04-25 DIAGNOSIS — Z87891 Personal history of nicotine dependence: Secondary | ICD-10-CM | POA: Diagnosis not present

## 2016-04-25 DIAGNOSIS — T24201A Burn of second degree of unspecified site of right lower limb, except ankle and foot, initial encounter: Secondary | ICD-10-CM | POA: Diagnosis not present

## 2016-04-25 DIAGNOSIS — T2220XA Burn of second degree of shoulder and upper limb, except wrist and hand, unspecified site, initial encounter: Secondary | ICD-10-CM | POA: Diagnosis not present

## 2016-04-25 DIAGNOSIS — S0990XA Unspecified injury of head, initial encounter: Secondary | ICD-10-CM | POA: Diagnosis not present

## 2016-04-25 DIAGNOSIS — T24231A Burn of second degree of right lower leg, initial encounter: Secondary | ICD-10-CM

## 2016-04-25 DIAGNOSIS — M79642 Pain in left hand: Secondary | ICD-10-CM | POA: Diagnosis present

## 2016-04-25 MED ORDER — TETANUS-DIPHTH-ACELL PERTUSSIS 5-2.5-18.5 LF-MCG/0.5 IM SUSP
0.5000 mL | Freq: Once | INTRAMUSCULAR | Status: AC
  Administered 2016-04-25: 0.5 mL via INTRAMUSCULAR
  Filled 2016-04-25: qty 0.5

## 2016-04-25 MED ORDER — SILVER SULFADIAZINE 1 % EX CREA
TOPICAL_CREAM | Freq: Once | CUTANEOUS | Status: AC
  Administered 2016-04-25: 2 via TOPICAL
  Filled 2016-04-25: qty 50
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
## 2016-04-26 MED ORDER — NAPROXEN 500 MG PO TABS: 500.0000 mg | ORAL_TABLET | Freq: Two times a day (BID) | ORAL | Status: DC

## 2016-04-26 MED ORDER — ACETAMINOPHEN 325 MG PO TABS
650.0000 mg | ORAL_TABLET | Freq: Once | ORAL | Status: AC
  Administered 2016-04-26: 650 mg via ORAL
  Filled 2016-04-26: qty 2
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
## 2016-05-03 DIAGNOSIS — L639 Alopecia areata, unspecified: Secondary | ICD-10-CM | POA: Diagnosis not present

## 2016-05-03 DIAGNOSIS — L649 Androgenic alopecia, unspecified: Secondary | ICD-10-CM | POA: Diagnosis not present

## 2016-05-03 DIAGNOSIS — L820 Inflamed seborrheic keratosis: Secondary | ICD-10-CM | POA: Diagnosis not present

## 2016-05-03 DIAGNOSIS — L280 Lichen simplex chronicus: Secondary | ICD-10-CM | POA: Diagnosis not present

## 2016-05-03 HISTORY — PX: OTHER SURGICAL HISTORY: SHX169

## 2016-05-04 DIAGNOSIS — R102 Pelvic and perineal pain: Secondary | ICD-10-CM | POA: Diagnosis not present

## 2016-05-05 ENCOUNTER — Ambulatory Visit (INDEPENDENT_AMBULATORY_CARE_PROVIDER_SITE_OTHER): Payer: Commercial Managed Care - HMO | Admitting: Internal Medicine

## 2016-05-05 ENCOUNTER — Encounter: Payer: Self-pay | Admitting: Internal Medicine

## 2016-05-05 VITALS — BP 120/70 | Temp 97.5°F | Ht 63.0 in | Wt 155.8 lb

## 2016-05-05 DIAGNOSIS — M79642 Pain in left hand: Secondary | ICD-10-CM | POA: Diagnosis not present

## 2016-05-05 DIAGNOSIS — Z9181 History of falling: Secondary | ICD-10-CM | POA: Diagnosis not present

## 2016-05-05 DIAGNOSIS — W39XXXS Discharge of firework, sequela: Secondary | ICD-10-CM

## 2016-05-05 DIAGNOSIS — T24001A Burn of unspecified degree of unspecified site of right lower limb, except ankle and foot, initial encounter: Secondary | ICD-10-CM
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
## 2016-05-05 NOTE — Patient Instructions (Signed)
Burn looks  Good for healing   Continue  ointment and keep covered with non stick  Dressing . Avoid extreme temperatures to wound .  If gets red or worse recheck  For reevaluation.   If hand area not better 4-6 weeks  out from the injury  Let us recheck .     Second-Degree Burn A second-degree burn affects the 2 outer layers of skin. The outer layer (epidermis) and the layer underneath it (dermis) are both burned. Another name for this type of burn is a partial thickness burn. A second-degree burn may be called minor or major. This depends on the size of the burn. It also depends on what parts of the skin are burned. Minor burns may be treated with first aid. Major burns are a medical emergency. A second-degree burn is worse than a first-degree burn, but not as bad as a third-degree burn. A first-degree burn affects only the epidermis. A third-degree burn goes through all the layers of skin. A second-degree burn usually heals in 3 to 4 weeks. A minor second-degree burn usually does not leave a scar.Deeper second-degree burns may lead to scarring of the skin or contractures over joints.Contractures are scars that form over joints and may lead to reduced mobility at those joints. CAUSES  Heat (thermal) injury. This happens when skin comes in contact with something very hot. It could be a flame, a hot object, hot liquid, or steam. Most second-degree burns are thermal injuries.  Radiation. Sunlight is one type of radiation that can burn the skin. Another type of radiation is used to heat food. Radiation is also used to treat some diseases, such as cancer. All types of radiation can burn the skin. Sunlight usually causes a first-degree burn. Radiation used for heating food or treating a disease can cause a second-degree burn.  Electricity. Electrical burns can cause more damage under the skin than on the surface. They should always be treated as major burns.  Chemicals. Many chemicals can burn the  skin. The burn should be flushed with cool water and checked by an emergency caregiver. SYMPTOMS Symptoms of second-degree burns include:  Severe pain.  Extreme tenderness.  Deep redness.  Blistered skin.  Skin that has changed color.It might look blotchy, wet, or shiny.  Swelling. TREATMENT Some second-degree burns may need to be treated in a hospital. These include major burns, electrical burns, and chemical burns. Many other second-degree burns can be treated with regular first aid, such as:  Cooling the burn. Use cool, germ-free (sterile) salt water. Place the burned area of skin into a tub of water, or cover the burned area with clean, wet towels.  Taking pain medicine.  Removing the dead skin from broken blisters. A trained caregiver may do this. Do not pop blisters.  Gently washing your skin with mild soap.  Covering the burned area with a cream.Silver sulfadiazine is a cream for burns. An antibiotic cream, such as bacitracin, may also be used to fight infection. Do not use other ointments or creams unless your caregiver says it is okay.  Protecting the burn with a sterile, non-sticky bandage.  Bandaging fingers and toes separately. This keeps them from sticking together.  Taking an antibiotic. This can help prevent infection.  Getting a tetanus shot. HOME CARE INSTRUCTIONS Medication  Take any medicine prescribed by your caregiver. Follow the directions carefully.  Ask your caregiver if you can take over-the-counter medicine to relieve pain and swelling. Do not give aspirin to children.  Make sure your caregiver knows about all other medicines you take.This includes over-the-counter medicines. Burn care  You will need to change the bandage on your burn. You may need to do this 2 or 3 times each day.  Gently clean the burned area.  Put ointment on it.  Cover the burn with a sterile bandage.  For some deeper burns or burns that cover a large area,  compression garments may be prescribed. These garments can help minimize scarring and protect your mobility.  Do not put butter or oil on your skin. Use only the cream prescribed by your caregiver.  Do not put ice on your burn.  Do not break blisters on your skin.  Keep the bandaged area dry. You might need to take a sponge bath for awhile.Ask your caregiver when you can take a shower or a tub bath again.  Do not scratch an itchy burn. Your caregiver may give you medicine to relieve very bad itching.  Infection is a big danger after a second-degree burn. Tell your caregiver right away if you have signs of infection, such as:  Redness or changing color in the burned area.  Fluid leaking from the burn.  Swelling in the burn area.  A bad smell coming from the wound. Follow-up  Keep all follow-up appointments.This is important. This is how your caregiver can tell if your treatment is working.  Protect your burn from sunlight.Use sunscreen whenever you go outside.Burned areas may be sensitive to the sun for up to 1 year. Exposure to the sun may also cause permanent darkening of scars. SEEK MEDICAL CARE IF:  You have any questions about medicines.  You have any questions about your treatment.  You wonder if it is okay to do a particular activity.  You develop a fever of more than 100.5 F (38.1 C). SEEK IMMEDIATE MEDICAL CARE IF:  You think your burn might be infected. It may change color, become red, leak fluid, swell, or smell bad.  You develop a fever of more than 102 F (38.9 C).   This information is not intended to replace advice given to you by your health care provider. Make sure you discuss any questions you have with your health care provider.   Document Released: 03/13/2011 Document Revised: 01/01/2012 Document Reviewed: 03/13/2011 Elsevier Interactive Patient Education [employer]

## 2016-05-08 ENCOUNTER — Telehealth: Payer: Self-pay | Admitting: Internal Medicine

## 2016-05-08 NOTE — Telephone Encounter (Signed)
Pt was seen 7/14 and would like to know should she continue taking all her vitamins including centrum. Pt also is having fatigue and forgot to mention to md on Friday.please advise

## 2016-05-09 NOTE — Telephone Encounter (Signed)
Pt notified no reason to stop the vitamins.  Also notified if she needs to discuss fatigue than will need to make an appt.  Pt agreed.

## 2016-05-09 NOTE — Telephone Encounter (Signed)
No reason to stop the vitamins  fatigue has many causes    Doubt if related directly .    Lab Results  Component Value Date   WBC 6.9 12/31/2015   HGB 12.7 12/31/2015   HCT 38.8 12/31/2015   PLT 351.0 12/31/2015   GLUCOSE 105* 12/31/2015   CHOL 149 12/31/2015   TRIG 75.0 12/31/2015   HDL 48.10 12/31/2015   LDLDIRECT 146.3 01/26/2010   LDLCALC 85 12/31/2015   ALT 13 12/31/2015   AST 21 12/31/2015   NA 142 12/31/2015   K 3.7 12/31/2015   CL 104 12/31/2015   CREATININE 1.30* 12/31/2015   BUN 20 12/31/2015   CO2 30 12/31/2015   TSH 1.72 12/31/2015   INR 1.04 05/05/2011   HGBA1C 6.1 12/31/2015   MICROALBUR 3.1* 12/31/2015

## 2016-05-18 ENCOUNTER — Telehealth: Payer: Self-pay | Admitting: Internal Medicine

## 2016-05-18 MED ORDER — ATORVASTATIN CALCIUM 20 MG PO TABS: 20.0000 mg | ORAL_TABLET | Freq: Every day | ORAL | 1 refills | Status: DC

## 2016-05-18 NOTE — Telephone Encounter (Signed)
Pt need new Rx for atorvastatin    Pharm:  [facility]

## 2016-05-18 NOTE — Telephone Encounter (Signed)
Sent to the pharmacy by e-scribe. 

## 2016-05-22 ENCOUNTER — Encounter: Payer: Self-pay | Admitting: Family Medicine

## 2016-05-29 ENCOUNTER — Telehealth: Payer: Self-pay | Admitting: Internal Medicine

## 2016-05-29 DIAGNOSIS — F314 Bipolar disorder, current episode depressed, severe, without psychotic features: Secondary | ICD-10-CM | POA: Diagnosis not present

## 2016-05-29 NOTE — Telephone Encounter (Signed)
TELEPHONE ADVICE RECORD [facility]  Patient Name: Desiree [name]  DOB: Feb 23, 1951    Initial Comment Caller states she just left her psychiatrist. She's having left side pain and a BP of 147/80.   Nurse Assessment  Nurse: Odis Luster, RN, Bjorn Loser Date/Time (Eastern Time): 05/29/2016 4:01:08 PM  Confirm and document reason for call. If symptomatic, describe symptoms. You must click the next button to save text entered. ---Caller states she just left her psychiatrist. She's having left side pain, in her abd (have done ultrasounds there and cannot find anything) and a BP of 147/80 (reports that this is high for her). Reports that her last BM was today. Denies burning with urination. Reports she has had pain in her side for several days. Reports that her psychiatrist told her to call her PCP and get an appt. She reports that she has a shooting sharp pain at times.  Has the patient traveled out of the country within the last 30 days? ---Not Applicable  Does the patient have any new or worsening symptoms? ---Yes  Will a triage be completed? ---Yes  Related visit to physician within the last 2 weeks? ---Yes  Does the PT have any chronic conditions? (i.e. diabetes, asthma, etc.) ---Yes  List chronic conditions. ---HTN, depression; diabetes  Is this a behavioral health or substance abuse call? ---No     Guidelines    Guideline Title Affirmed Question Affirmed Notes  Abdominal Pain - Female [1] MODERATE (e.g., interferes with normal activities) AND [2] pain comes and goes (cramps) AND [3] present > 24 hours (Exception: pain with Vomiting or Diarrhea - see that Guideline)    Final Disposition User   See Physician within 24 Hours [place], RN, Bjorn Loser    Comments  Caller scheduled for appt on 05/30/16 @ 2:pm with Angela Adam, NP in the [place] location. Caller voiced understanding.   Referrals  REFERRED TO PCP OFFICE   Disagree/Comply: Comply

## 2016-05-29 NOTE — Telephone Encounter (Signed)
Appointment Scheduled with [facility] tomorrow @ 2:00

## 2016-05-30 ENCOUNTER — Encounter: Payer: Self-pay | Admitting: Adult Health

## 2016-05-30 ENCOUNTER — Ambulatory Visit (INDEPENDENT_AMBULATORY_CARE_PROVIDER_SITE_OTHER): Payer: Commercial Managed Care - HMO | Admitting: Adult Health

## 2016-05-30 VITALS — BP 124/70 | HR 104 | Temp 98.3°F | Ht 63.0 in | Wt 154.0 lb

## 2016-05-30 DIAGNOSIS — R109 Unspecified abdominal pain: Secondary | ICD-10-CM | POA: Diagnosis not present

## 2016-05-30 LAB — POC URINALSYSI DIPSTICK (AUTOMATED)
Bilirubin, UA: NEGATIVE
Blood, UA: NEGATIVE
Glucose, UA: NEGATIVE
Ketones, UA: NEGATIVE
Nitrite, UA: NEGATIVE
Protein, UA: NEGATIVE
Spec Grav, UA: 1.015
Urobilinogen, UA: 0.2
pH, UA: 6

## 2016-05-30 LAB — COMPREHENSIVE METABOLIC PANEL
ALT: 21 U/L (ref 0–35)
AST: 29 U/L (ref 0–37)
Albumin: 4.3 g/dL (ref 3.5–5.2)
Alkaline Phosphatase: 70 U/L (ref 39–117)
BUN: 16 mg/dL (ref 6–23)
CO2: 30 mEq/L (ref 19–32)
Calcium: 9.8 mg/dL (ref 8.4–10.5)
Chloride: 102 mEq/L (ref 96–112)
Creatinine, Ser: 1.28 mg/dL — ABNORMAL HIGH (ref 0.40–1.20)
GFR: 53.97 mL/min — ABNORMAL LOW (ref >60.00)
Glucose, Bld: 93 mg/dL (ref 70–99)
Potassium: 4.2 mEq/L (ref 3.5–5.1)
Sodium: 140 mEq/L (ref 135–145)
Total Bilirubin: 0.5 mg/dL (ref 0.2–1.2)
Total Protein: 7.2 g/dL (ref 6.0–8.3)

## 2016-05-30 LAB — LIPASE: Lipase: 68 U/L — ABNORMAL HIGH (ref 11.0–59.0)

## 2016-05-30 LAB — CBC WITH DIFFERENTIAL/PLATELET
Basophils Absolute: 0 10*3/uL (ref 0.0–0.1)
Basophils Relative: 0.8 % (ref 0.0–3.0)
Eosinophils Absolute: 0.3 10*3/uL (ref 0.0–0.7)
Eosinophils Relative: 4.8 % (ref 0.0–5.0)
HCT: 37.1 % (ref 36.0–46.0)
Hemoglobin: 12.3 g/dL (ref 12.0–15.0)
Lymphocytes Relative: 48.1 % — ABNORMAL HIGH (ref 12.0–46.0)
Lymphs Abs: 2.9 10*3/uL (ref 0.7–4.0)
MCHC: 33 g/dL (ref 30.0–36.0)
MCV: 90.4 fl (ref 78.0–100.0)
Monocytes Absolute: 0.5 10*3/uL (ref 0.1–1.0)
Monocytes Relative: 8 % (ref 3.0–12.0)
Neutro Abs: 2.3 10*3/uL (ref 1.4–7.7)
Neutrophils Relative %: 38.3 % — ABNORMAL LOW (ref 43.0–77.0)
Platelets: 335 10*3/uL (ref 150.0–400.0)
RBC: 4.11 Mil/uL (ref 3.87–5.11)
RDW: 15 % (ref 11.5–15.5)
WBC: 6 10*3/uL (ref 4.0–10.5)

## 2016-05-30 NOTE — Progress Notes (Signed)
Pre visit review using our clinic review tool, if applicable. No additional management support is needed unless otherwise documented below in the visit note. 

## 2016-05-30 NOTE — Patient Instructions (Signed)
It was great meeting you today!  I am unsure what is causing your pain.   I will follow up with you regarding your lab work.
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
## 2016-06-05 ENCOUNTER — Telehealth: Payer: Self-pay | Admitting: Internal Medicine

## 2016-06-05 NOTE — Telephone Encounter (Signed)
Pt calling and would like to have her results.

## 2016-06-06 ENCOUNTER — Encounter: Payer: Self-pay | Admitting: Internal Medicine

## 2016-06-06 ENCOUNTER — Ambulatory Visit (INDEPENDENT_AMBULATORY_CARE_PROVIDER_SITE_OTHER): Payer: Commercial Managed Care - HMO | Admitting: Internal Medicine

## 2016-06-06 VITALS — BP 146/72 | Temp 98.3°F | Wt 155.4 lb

## 2016-06-06 DIAGNOSIS — R109 Unspecified abdominal pain: Secondary | ICD-10-CM

## 2016-06-06 DIAGNOSIS — M545 Low back pain: Secondary | ICD-10-CM | POA: Diagnosis not present

## 2016-06-06 DIAGNOSIS — M546 Pain in thoracic spine: Secondary | ICD-10-CM

## 2016-06-06 DIAGNOSIS — M25552 Pain in left hip: Secondary | ICD-10-CM | POA: Diagnosis not present

## 2016-06-06 DIAGNOSIS — M542 Cervicalgia: Secondary | ICD-10-CM | POA: Diagnosis not present

## 2016-06-06 NOTE — Patient Instructions (Addendum)
This pain acts almost like a pinched nerve pain in an unusual site location. Consideration if it's a pinch nerve in your thoracic spine. Because Dr. Loreta Ave has seen you for this before when it wasn't as bad in the  past we will get an appointment with her as soon as possible and get her opinion as to the advisability of a repeat ultrasound or what she thinks about the pain  And if  there is a GI cause of her pain that we need to treat.  We'll also try to get the from the orthopedics office Dr. Ophelia Charter. When they do an MRI this week would hopefully be able to look at that portion of your spine and radiates to the upper abdomen lower chest. Uncertain as to what kind of medicine may help your pain. Sometimes gabapentin has been used for nerve pain but that medicine has side effects of sedation and swelling.  Blood test are  Ok .

## 2016-06-06 NOTE — Progress Notes (Signed)
Pre visit review using our clinic review tool, if applicable. No additional management support is needed unless otherwise documented below in the visit note.  Pt has appt with Dr. Loreta Ave on 06/08/16.  Last seen Dr. Loreta Ave on 08/11/15.

## 2016-06-06 NOTE — Telephone Encounter (Signed)
Patient notified
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
## 2016-06-08 DIAGNOSIS — R140 Abdominal distension (gaseous): Secondary | ICD-10-CM | POA: Diagnosis not present

## 2016-06-08 DIAGNOSIS — K5900 Constipation, unspecified: Secondary | ICD-10-CM | POA: Diagnosis not present

## 2016-06-08 DIAGNOSIS — K219 Gastro-esophageal reflux disease without esophagitis: Secondary | ICD-10-CM | POA: Diagnosis not present

## 2016-06-13 ENCOUNTER — Encounter: Payer: Self-pay | Admitting: Adult Health

## 2016-06-21 ENCOUNTER — Telehealth: Payer: Self-pay | Admitting: Internal Medicine

## 2016-06-21 NOTE — Telephone Encounter (Signed)
Spoke to the pt and advised that Dr. Fabian Sharp is out of the office and will not be back until  06/27/16.  Advised to call Dr. Ophelia Charter office.  Pt agreed.  Patient Instructions  This pain acts almost like a pinched nerve pain in an unusual site location. Consideration if it's a pinch nerve in your thoracic spine. Because Dr. Loreta Ave has seen you for this before when it wasn't as bad in the  past we will get an appointment with her as soon as possible and get her opinion as to the advisability of a repeat ultrasound or what she thinks about the pain  And if  there is a GI cause of her pain that we need to treat.  We'll also try to get the from the orthopedics office Dr. Ophelia Charter. When they do an MRI this week would hopefully be able to look at that portion of your spine and radiates to the upper abdomen lower chest. Uncertain as to what kind of medicine may help your pain. Sometimes gabapentin has been used for nerve pain but that medicine has side effects of sedation and swelling.  Blood test are  Ok .

## 2016-06-21 NOTE — Telephone Encounter (Signed)
Pt states she is still having shooting pains in her rib area . Pt instructed to got to GI specialist. dr Loreta Ave told her to go back and see Dr Fabian Sharp due to the ongoing pain under her breast and back/rib area.  Pt would like to know if she should see Dr Fabian Sharp or her back MD, dr Fabian Sharp.

## 2016-06-22 ENCOUNTER — Other Ambulatory Visit: Payer: Self-pay | Admitting: Internal Medicine

## 2016-06-22 ENCOUNTER — Telehealth: Payer: Self-pay | Admitting: Internal Medicine

## 2016-06-22 NOTE — Telephone Encounter (Signed)
° °  Pt req a few pills until she received her mail order   LORazepam (ATIVAN) 1 MG tablet  [facility]

## 2016-06-23 ENCOUNTER — Other Ambulatory Visit: Payer: Self-pay | Admitting: Orthopaedic Surgery

## 2016-06-23 DIAGNOSIS — M47812 Spondylosis without myelopathy or radiculopathy, cervical region: Secondary | ICD-10-CM

## 2016-06-23 DIAGNOSIS — M4316 Spondylolisthesis, lumbar region: Secondary | ICD-10-CM

## 2016-06-23 MED ORDER — LOSARTAN POTASSIUM 25 MG PO TABS
25.0000 mg | ORAL_TABLET | Freq: Every day | ORAL | 0 refills | Status: DC
Start: 2016-06-23 — End: 2016-06-28

## 2016-06-23 NOTE — Telephone Encounter (Signed)
Left a message for a return call.  Medication is filled by Dr Patty Sermons.  Pt will need to contact that office.

## 2016-06-23 NOTE — Telephone Encounter (Signed)
Pt notified that Dr. Fabian Sharp is not the pre scriber of her lorazepam.  She will call her psychiatrist office.

## 2016-06-23 NOTE — Addendum Note (Signed)
Addended by: Raj Janus T on: 06/23/2016 11:42 AM   Modules accepted: Orders

## 2016-06-23 NOTE — Telephone Encounter (Signed)
Received another phone call from the pt.  She did not need lorazepam.  She did need losartan sent to [facility].  Informed her that I will send in a 2 wk supply until she receives her shipment from [facility].

## 2016-06-27 ENCOUNTER — Telehealth: Payer: Self-pay | Admitting: Internal Medicine

## 2016-06-27 ENCOUNTER — Ambulatory Visit: Payer: Self-pay | Admitting: Internal Medicine

## 2016-06-27 NOTE — Telephone Encounter (Signed)
Patient Name: Desiree [name] DOB: Dec 06, 1950 Initial Comment Caller states c/o numbness of left side of face and neck and increased drowsiness. She is scheduled for an upcoming MRI for numbness as this is not a new symptom. Nurse Assessment Nurse: Roma Kayser, RN, Santina Evans Date/Time (Eastern Time): 06/27/2016 1:30:16 PM Confirm and document reason for call. If symptomatic, describe symptoms. You must click the next button to save text entered. ---caller states she has been having numbness and already has MRI set up for that. that is not why she is calling she is calling because the past week all she wants to do is sleep and has generalized fatigue Has the patient traveled out of the country within the last 30 days? ---Not Applicable Does the patient have any new or worsening symptoms? ---Yes Will a triage be completed? ---Yes Related visit to physician within the last 2 weeks? ---Yes Does the PT have any chronic conditions? (i.e. diabetes, asthma, etc.) ---Yes List chronic conditions. ---htn Is this a behavioral health or substance abuse call? ---No Guidelines Guideline Title Affirmed Question Affirmed Notes Weakness (Generalized) and Fatigue [1] MODERATE weakness (i.e., interferes with work, school, normal activities) AND [2] cause unknown (Exceptions: weakness with acute minor illness, or weakness from poor fluid intake) Final Disposition User See Physician within 4 Hours (or PCP triage) Roma Kayser, RN, Catherine Referrals REFERRED TO PCP OFFICE Disagree/Comply: Danella Maiers
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
## 2016-06-27 NOTE — Telephone Encounter (Signed)
Appointment today with Dr. Fabian Sharp

## 2016-06-28 ENCOUNTER — Other Ambulatory Visit: Payer: Self-pay | Admitting: Internal Medicine
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
## 2016-06-29 ENCOUNTER — Ambulatory Visit (INDEPENDENT_AMBULATORY_CARE_PROVIDER_SITE_OTHER): Payer: Commercial Managed Care - HMO | Admitting: Internal Medicine

## 2016-06-29 ENCOUNTER — Ambulatory Visit (INDEPENDENT_AMBULATORY_CARE_PROVIDER_SITE_OTHER)
Admission: RE | Admit: 2016-06-29 | Discharge: 2016-06-29 | Disposition: A | Discharge status: Home / Self Care | Payer: Commercial Managed Care - HMO | Source: Ambulatory Visit | Attending: Internal Medicine | Admitting: Internal Medicine

## 2016-06-29 ENCOUNTER — Encounter: Payer: Self-pay | Admitting: Internal Medicine

## 2016-06-29 VITALS — BP 132/72 | Temp 98.1°F | Ht 63.0 in | Wt 156.8 lb

## 2016-06-29 DIAGNOSIS — R109 Unspecified abdominal pain: Secondary | ICD-10-CM

## 2016-06-29 DIAGNOSIS — Z23 Encounter for immunization: Secondary | ICD-10-CM | POA: Diagnosis not present

## 2016-06-29 DIAGNOSIS — R221 Localized swelling, mass and lump, neck: Secondary | ICD-10-CM | POA: Diagnosis not present

## 2016-06-29 DIAGNOSIS — R200 Anesthesia of skin: Secondary | ICD-10-CM

## 2016-06-29 DIAGNOSIS — G2401 Drug induced subacute dyskinesia: Secondary | ICD-10-CM

## 2016-06-29 DIAGNOSIS — N289 Disorder of kidney and ureter, unspecified: Secondary | ICD-10-CM

## 2016-06-29 DIAGNOSIS — G4710 Hypersomnia, unspecified: Secondary | ICD-10-CM | POA: Diagnosis not present

## 2016-06-29 DIAGNOSIS — R079 Chest pain, unspecified: Secondary | ICD-10-CM | POA: Diagnosis not present

## 2016-06-29 LAB — COMPREHENSIVE METABOLIC PANEL
ALT: 15 U/L (ref 0–35)
AST: 21 U/L (ref 0–37)
Albumin: 4.2 g/dL (ref 3.5–5.2)
Alkaline Phosphatase: 66 U/L (ref 39–117)
BUN: 12 mg/dL (ref 6–23)
CO2: 32 mEq/L (ref 19–32)
Calcium: 9.2 mg/dL (ref 8.4–10.5)
Chloride: 102 mEq/L (ref 96–112)
Creatinine, Ser: 1.3 mg/dL — ABNORMAL HIGH (ref 0.40–1.20)
GFR: 53 mL/min — ABNORMAL LOW (ref >60.00)
Glucose, Bld: 86 mg/dL (ref 70–99)
Potassium: 3.7 mEq/L (ref 3.5–5.1)
Sodium: 140 mEq/L (ref 135–145)
Total Bilirubin: 0.3 mg/dL (ref 0.2–1.2)
Total Protein: 7.4 g/dL (ref 6.0–8.3)

## 2016-06-29 LAB — CBC WITH DIFFERENTIAL/PLATELET
Basophils Absolute: 0 10*3/uL (ref 0.0–0.1)
Basophils Relative: 0.7 % (ref 0.0–3.0)
Eosinophils Absolute: 0.4 10*3/uL (ref 0.0–0.7)
Eosinophils Relative: 5.6 % — ABNORMAL HIGH (ref 0.0–5.0)
HCT: 40 % (ref 36.0–46.0)
Hemoglobin: 13.3 g/dL (ref 12.0–15.0)
Lymphocytes Relative: 37.3 % (ref 12.0–46.0)
Lymphs Abs: 2.5 10*3/uL (ref 0.7–4.0)
MCHC: 33.1 g/dL (ref 30.0–36.0)
MCV: 90.6 fl (ref 78.0–100.0)
Monocytes Absolute: 0.5 10*3/uL (ref 0.1–1.0)
Monocytes Relative: 6.8 % (ref 3.0–12.0)
Neutro Abs: 3.3 10*3/uL (ref 1.4–7.7)
Neutrophils Relative %: 49.6 % (ref 43.0–77.0)
Platelets: 314 10*3/uL (ref 150.0–400.0)
RBC: 4.41 Mil/uL (ref 3.87–5.11)
RDW: 14.6 % (ref 11.5–15.5)
WBC: 6.7 10*3/uL (ref 4.0–10.5)

## 2016-06-29 LAB — SEDIMENTATION RATE: Sed Rate: 15 mm/hr (ref 0–30)

## 2016-06-29 LAB — HEMOGLOBIN A1C: Hgb A1c MFr Bld: 6.2 % (ref 4.6–6.5)

## 2016-06-29 LAB — VITAMIN B12: Vitamin B-12: >1500 pg/mL — ABNORMAL HIGH (ref 211–911)

## 2016-06-29 MED ORDER — ONDANSETRON 4 MG PO TBDP: 4.0000 mg | ORAL_TABLET | Freq: Three times a day (TID) | ORAL | 0 refills | Status: DC | PRN

## 2016-06-29 NOTE — Telephone Encounter (Signed)
Pt needs refill on losartan

## 2016-06-29 NOTE — Telephone Encounter (Signed)
Sent to the pharmacy by e-scribe. 

## 2016-06-29 NOTE — Telephone Encounter (Signed)
Noted  

## 2016-06-29 NOTE — Patient Instructions (Addendum)
Rechecking  Blood tests because of you increase prblems .   Agree with proceeding with mri  For your sx .   Get chest x ray    considieration of  A sleep based problem  Since your medications haven't really changed that much  To cause the symptoms  Considieration of seeing neurologist for the numbness  The gland in your neck may be reaction from infection but I dont  see any on your exam   Plan fu depending on the results and  Further work up.

## 2016-06-29 NOTE — Telephone Encounter (Signed)
Pt states she found a whole bottle of losartan in her room when she cleaned up. No need to worry about a refill.

## 2016-06-30 ENCOUNTER — Telehealth: Payer: Self-pay | Admitting: Internal Medicine

## 2016-06-30 ENCOUNTER — Emergency Department (HOSPITAL_COMMUNITY): Payer: Commercial Managed Care - HMO

## 2016-06-30 ENCOUNTER — Encounter (HOSPITAL_COMMUNITY): Payer: Self-pay | Admitting: Emergency Medicine

## 2016-06-30 ENCOUNTER — Emergency Department (HOSPITAL_COMMUNITY)
Admission: EM | Admit: 2016-06-30 | Discharge: 2016-06-30 | Disposition: A | Discharge status: Home / Self Care | Payer: Commercial Managed Care - HMO | Attending: Emergency Medicine | Admitting: Emergency Medicine

## 2016-06-30 DIAGNOSIS — W19XXXA Unspecified fall, initial encounter: Secondary | ICD-10-CM

## 2016-06-30 DIAGNOSIS — W08XXXA Fall from other furniture, initial encounter: Secondary | ICD-10-CM | POA: Insufficient documentation

## 2016-06-30 DIAGNOSIS — S299XXA Unspecified injury of thorax, initial encounter: Secondary | ICD-10-CM | POA: Diagnosis not present

## 2016-06-30 DIAGNOSIS — S20211A Contusion of right front wall of thorax, initial encounter: Secondary | ICD-10-CM | POA: Diagnosis not present

## 2016-06-30 DIAGNOSIS — E119 Type 2 diabetes mellitus without complications: Secondary | ICD-10-CM | POA: Insufficient documentation

## 2016-06-30 DIAGNOSIS — Z87891 Personal history of nicotine dependence: Secondary | ICD-10-CM | POA: Insufficient documentation

## 2016-06-30 DIAGNOSIS — S20212A Contusion of left front wall of thorax, initial encounter: Secondary | ICD-10-CM | POA: Insufficient documentation

## 2016-06-30 DIAGNOSIS — I10 Essential (primary) hypertension: Secondary | ICD-10-CM | POA: Diagnosis not present

## 2016-06-30 DIAGNOSIS — Y999 Unspecified external cause status: Secondary | ICD-10-CM | POA: Diagnosis not present

## 2016-06-30 DIAGNOSIS — Y939 Activity, unspecified: Secondary | ICD-10-CM | POA: Insufficient documentation

## 2016-06-30 DIAGNOSIS — Y929 Unspecified place or not applicable: Secondary | ICD-10-CM | POA: Insufficient documentation

## 2016-06-30 DIAGNOSIS — Z79899 Other long term (current) drug therapy: Secondary | ICD-10-CM | POA: Insufficient documentation

## 2016-06-30 DIAGNOSIS — S0990XA Unspecified injury of head, initial encounter: Secondary | ICD-10-CM | POA: Insufficient documentation

## 2016-06-30 LAB — CELIAC PANEL 10
Endomysial Screen: NEGATIVE
Gliadin IgA: 4 Units (ref <20)
Gliadin IgG: 3 Units (ref <20)
IgA: 161 mg/dL (ref 81–463)
Tissue Transglut Ab: 18 U/mL — ABNORMAL HIGH (ref <6)
Tissue Transglutaminase Ab, IgA: 1 U/mL (ref <4)

## 2016-06-30 LAB — ANTI-NUCLEAR AB-TITER (ANA TITER): ANA Titer 1: 1:80 {titer} — ABNORMAL HIGH

## 2016-06-30 LAB — EPSTEIN-BARR VIRUS VCA ANTIBODY PANEL
EBV NA IgG: 289 U/mL — ABNORMAL HIGH
EBV VCA IgG: 210 U/mL — ABNORMAL HIGH
EBV VCA IgM: <36 U/mL

## 2016-06-30 LAB — ANA: Anti Nuclear Antibody(ANA): POSITIVE — AB

## 2016-06-30 LAB — CBG MONITORING, ED: Glucose-Capillary: 77 mg/dL (ref 65–99)

## 2016-06-30 MED ORDER — ACETAMINOPHEN 325 MG PO TABS
650.0000 mg | ORAL_TABLET | Freq: Once | ORAL | Status: AC
  Administered 2016-06-30: 650 mg via ORAL
  Filled 2016-06-30: qty 2

## 2016-06-30 NOTE — ED Triage Notes (Addendum)
Pt complaint of fall resulting in hitting left brow and left ribcage an hour ago. Pt denies LOC, blood thinners, or vision loss.

## 2016-06-30 NOTE — Discharge Instructions (Signed)
Take Tylenol as directed for pain. Get your blood pressure recheck within the next 3 weeks. Today's was elevated at150/93

## 2016-06-30 NOTE — ED Notes (Signed)
Bed: [facility] Expected date:  Expected time:  Means of arrival:  Comments: 

## 2016-06-30 NOTE — ED Provider Notes (Signed)
[facility] DEPT Provider Note   CSN: [id number] Arrival date & time: 06/30/16  1739     History   Chief Complaint Chief Complaint  Patient presents with  . Fall  . Head Injury  . Other    Ribcage Pain    HPI Desiree [name] is a 65 y.o. female.  HPIpatient fell off her couch onto the floor for p.m. Today striking her foreheadand left rib cage. She complains of pain at left rib cage and slight pain above left eyebrow. No loss of consciousness no neck pain no headache no other complaint. No treatment prior to coming here. Nothing makes pain better or worse. Pain is mild to moderate at present.  Past Medical History:  Diagnosis Date  . Bipolar disorder ([place])   . CANDIDIASIS, ESOPHAGEAL 07/28/2009   Qualifier: Diagnosis of  By: [name] MD, [name]   . Depression   . Diabetes mellitus without complication ([place])   . FH: colonic polyps   . Fractured elbow    right   . GERD (gastroesophageal reflux disease)   . HH (hiatus hernia)   . History of carpal tunnel syndrome   . History of transfusion of packed red blood cells   . Hyperlipidemia   . Neuroleptic-induced tardive dyskinesia   . Osteoarthritis of more than one site   . Seasonal allergies     Patient Active Problem List   Diagnosis Date Noted  . Polypharmacy 06/23/2015  . Hyperlipidemia 04/19/2015  . Visit for preventive health examination 01/01/2015  . Primary osteoarthritis involving multiple joints 01/01/2015  . Bipolar affective disorder, currently depressed, moderate ([place])   . Bipolar I disorder with mania ([place]) 08/17/2014  . Foot cramps 07/03/2014  . Hypertension 10/21/2013  . Ear fullness 10/21/2013  . Dizziness 03/25/2013  . Medication withdrawal ([place]) 03/05/2013  . Diabetes mellitus with renal manifestations, controlled ([place]) 11/10/2012  . Alopecia areata 02/06/2012  . Obesity (BMI 30-39.9) 02/06/2012  . Renal insufficiency 07/16/2011  . Bipolar disorder ([place])   . Neuroleptic-induced tardive dyskinesia    . Exertional dyspnea 02/07/2011  . Dyspnea on exertion 01/19/2011  . DIABETES MELLITUS, TYPE II, CONTROLLED 04/22/2010  . Carpal tunnel syndrome 02/02/2010  . CONSTIPATION 02/02/2010  . DEGENERATIVE JOINT DISEASE 02/02/2010  . TOBACCO USE, QUIT 07/28/2009  . CATARACTS 04/13/2009  . PAIN IN JOINT, ANKLE AND FOOT 09/16/2008  . EOSINOPHILIA 07/21/2008  . AFFECTIVE DISORDER 04/21/2008  . BACK PAIN, CHRONIC 02/12/2008  . LEG PAIN 02/12/2008  . ABNORMAL INVOLUNTARY MOVEMENTS 12/04/2007  . POSTURAL LIGHTHEADEDNESS 11/04/2007  . COLONIC POLYPS, HX OF 11/04/2007  . Essential hypertension 06/17/2007  . HYPERLIPIDEMIA 04/08/2007  . MITRAL VALVE PROLAPSE 04/08/2007  . GERD 04/08/2007  . LOW BACK PAIN SYNDROME 04/08/2007  . CHEST PAIN, RECURRENT 04/08/2007    Past Surgical History:  Procedure Laterality Date  . Back Fusion  2002  . CARPAL TUNNEL RELEASE  yates   left  . COLONOSCOPY N/A 01/05/2014   Procedure: COLONOSCOPY;  Surgeon: [name], MD;  Location: [facility];  Service: Endoscopy;  Laterality: N/A;  . EXTERNAL EAR SURGERY Left   . FINGER SURGERY Left   . Juvara osteomy    . KNEE SURGERY    . Rt. toe bunion    . skin, shave biopsy  05/03/2016   Left occipital scalp, top of scalp    OB History    No data available       Home Medications    Prior to Admission medications  Medication Sig Start Date End Date Taking? Authorizing Provider  ACCU-CHEK FASTCLIX LANCETS MISC USE TO CHECK BLOOD SUGAR 1-2 TIMES DAILY 03/01/16   [name], MD  acetaminophen (TYLENOL) 500 MG tablet Take 2 tablets (1,000 mg total) by mouth every 6 (six) hours as needed. 09/13/15   [name], MD  atorvastatin (LIPITOR) 20 MG tablet Take 1 tablet (20 mg total) by mouth daily. 05/18/16   [name], MD  B Complex Vitamins (VITAMIN B COMPLEX PO) Take 1 tablet by mouth daily.    Historical Provider, MD  BIOTIN PO Take by mouth.    Historical Provider, MD  Blood Glucose Monitoring  Suppl (ACCU-CHEK NANO SMARTVIEW) W/DEVICE KIT Use to check blood sugar 1-2 times daily 11/26/14   [name], MD  cetirizine (ZYRTEC) 10 MG tablet Take 10 mg by mouth at bedtime.     Historical Provider, MD  Cholecalciferol (VITAMIN D PO) Take by mouth.    Historical Provider, MD  docusate sodium (COLACE) 100 MG capsule Take 100 mg by mouth daily as needed for mild constipation.    Historical Provider, MD  DULoxetine (CYMBALTA) 60 MG capsule Take 1 capsule (60 mg total) by mouth daily. 09/01/14   [name], MD  glucose blood (ACCU-CHEK SMARTVIEW) test strip Use to check blood sugar 1-2 times daily 11/26/14   [name], MD  hydroquinone 4 % cream apply to affected area twice a day TO SKIN 11/06/15   Historical Provider, MD  lamoTRIgine (LAMICTAL) 25 MG tablet Take 1 tablet (25 mg total) by mouth 2 (two) times daily. 10/06/14   [name], PA-C  Lancets Misc. (ACCU-CHEK FASTCLIX LANCET) KIT USE TO CHECK BLOOD SUGAR 1-2 TIMES DAILY 12/28/15   [name], MD  LORazepam (ATIVAN) 1 MG tablet Take 1 mg by mouth at bedtime. For anxiety 11/23/14   Historical Provider, MD  losartan (COZAAR) 25 MG tablet take 1 tablet by mouth once daily 06/29/16   [name], MD  mometasone (NASONEX) 50 MCG/ACT nasal spray Place 2 sprays into the nose daily. 03/23/15   [name], MD  Multiple Vitamins-Minerals (CENTRUM SILVER PO) Take 1 tablet by mouth daily.     Historical Provider, MD  naproxen (NAPROSYN) 500 MG tablet Take 1 tablet (500 mg total) by mouth 2 (two) times daily. 04/26/16   Domenic Moras, PA-C  NON FORMULARY IBguard and FDguard    Historical Provider, MD  ondansetron (ZOFRAN-ODT) 4 MG disintegrating tablet Take 1 tablet (4 mg total) by mouth every 8 (eight) hours as needed for nausea or vomiting. 06/29/16   [name], MD  orphenadrine (NORFLEX) 100 MG tablet Take 1 tablet (100 mg total) by mouth 2 (two) times daily. 09/13/15   [name], MD  pantoprazole (PROTONIX) 40 MG tablet Take 40 mg  by mouth daily.    Historical Provider, MD  Probiotic Product (ALIGN PO) Take by mouth.    Historical Provider, MD  triamcinolone lotion (KENALOG) 0.1 % Apply twice daily to affected area on skin/scalp 3-4 X wkly 12/27/15   Historical Provider, MD  vitamin E 400 UNIT capsule Take 400 Units by mouth daily.    Historical Provider, MD    Family History Family History  Problem Relation Age of Onset  . Heart attack Father   . Heart disease Father   . Throat cancer Brother   . Diabetes Mother   . Hypertension Mother   . Heart attack Mother   . Diabetes type  II Brother   . Heart disease Brother     Social History Social History  Substance Use Topics  . Smoking status: Former Smoker    Packs/day: 2.00    Years: 20.00    Types: Cigarettes    Quit date: 10/23/2002  . Smokeless tobacco: Never Used  . Alcohol use No     Allergies   Amoxicillin; Codeine; Hydrocodone-acetaminophen; and Penicillins   Review of Systems Review of Systems  Constitutional: Negative.   HENT: Negative.   Respiratory: Negative.   Cardiovascular: Positive for chest pain.       Pain at left rib cage  Gastrointestinal: Negative.   Musculoskeletal: Positive for gait problem.       Walks with a cane  Skin: Negative.   Allergic/Immunologic: Positive for immunocompromised state.       Diabetic  Neurological: Positive for numbness.       Numbness in left arm and left leg for several months, unchanged  Psychiatric/Behavioral: Negative.   All other systems reviewed and are negative.    Physical Exam Updated Vital Signs BP 171/96 (BP Location: Right Arm)   Pulse 100   Temp 98.2 F (36.8 C) (Oral)   Resp 20   SpO2 100%   Physical Exam  Constitutional: She is oriented to person, place, and time. She appears well-developed and well-nourished. No distress.  Alert Glasgow Coma Score 15  HENT:  Bilateral tympanic membranes normal. Golf ball sized hematomaat left eyebrow. Otherwise normocephalic atraumatic    Eyes: Conjunctivae are normal. Pupils are equal, round, and reactive to light.  Neck: Neck supple. No tracheal deviation present. No thyromegaly present.  nontender  Cardiovascular: Normal rate and regular rhythm.   No murmur heard. Pulmonary/Chest: Effort normal and breath sounds normal. She exhibits tenderness.  Mildly tender over left anterolateral ribs. No crepitance no flail no ecchymosis  Abdominal: Soft. Bowel sounds are normal. She exhibits no distension. There is no tenderness.  Musculoskeletal: Normal range of motion. She exhibits no edema or tenderness.  Entire spine nontender. Pelvis stable nontender. All 4 extremities without contusion abrasion or tenderness neurovascularly intact  Neurological: She is alert and oriented to person, place, and time. No cranial nerve deficit. She exhibits normal muscle tone. Coordination normal.  mmotor strength 5 over 5 overall gait normal cranial nerves II through XII grossly intact  Skin: Skin is warm and dry. No rash noted.  Psychiatric: She has a normal mood and affect.  Nursing note and vitals reviewed.    ED Treatments / Results  Labs (all labs ordered are listed, but only abnormal results are displayed) Labs Reviewed  CBG MONITORING, ED    EKG  EKG Interpretation None       Radiology Dg Chest 2 View  Result Date: 06/29/2016 CLINICAL DATA:  Right-sided soreness, status post fall 1 month ago. History of diabetes and hiatal hernia. Ex-smoker. EXAM: CHEST  2 VIEW COMPARISON:  09/13/2015 FINDINGS: PA and lateral views. The heart size and mediastinal contours are within normal limits. Both lungs are clear. No acute osseous abnormality. IMPRESSION: No active cardiopulmonary disease. Electronically Signed   By: Donavan Foil M.D.   On: 06/29/2016 10:32    Procedures Procedures (including critical care time)  Medications Ordered in ED Medications  acetaminophen (TYLENOL) tablet 650 mg (650 mg Oral Given 06/30/16 1843)   8:50 PM  feels improved after treatment with Tylenol.  Initial Impression / Assessment and Plan / ED Course  I have reviewed the triage vital signs and  the nursing notes.  Pertinent labs & imaging results that were available during my care of the patient were reviewed by me and considered in my medical decision making (see chart for details).  Clinical Course    Cervical spine cleared by nexus criteria. Imaging of brain not indicated. No scalp hematoma no loss of consciousness. Nonfocal neurologic exam. Plan Tylenol for pain.. Blood pressure recheck 3 weeks Final Clinical Impressions(s) / ED Diagnoses  Diagnosis #[id number] #2 minor  head injury #3 contusion of chest wall #4 elevated blood pressure Final diagnoses:  None    New Prescriptions New Prescriptions   No medications on file     SOrlie Dakin MD 06/30/16 2058

## 2016-06-30 NOTE — Telephone Encounter (Signed)
TELEPHONE ADVICE RECORD [facility]  Patient Name: Desiree [name]  DOB: Oct 31, 1950    Initial Comment Caller states she just fell off her couch onto the floor and she has a knot above her eye.    Nurse Assessment  Nurse: Harlon Flor, RN, Darl Pikes Date/Time (Eastern Time): 06/30/2016 4:37:50 PM  Confirm and document reason for call. If symptomatic, describe symptoms. You must click the next button to save text entered. ---Caller states she just fell off her couch onto the floor and she has a knot above her eye. She got up to adjust curtain in room and she does not know what happened she ended up on the floor. she has had this happen before . She lives alone . She has a knot above the left eye and the eye lid is swollen on outside . The floor is hard "cement like ". She came to while lying on floor . She is numb in left face and left arm and left leg. saw PCP yesterday. To have MRI soon due to neck problems. she has had numbness from her neck problems.  Has the patient traveled out of the country within the last 30 days? ---No  Does the patient have any new or worsening symptoms? ---Yes  Will a triage be completed? ---Yes  Related visit to physician within the last 2 weeks? ---No  Does the PT have any chronic conditions? (i.e. diabetes, asthma, etc.) ---Yes  List chronic conditions. ---HTN NIDDM CBS last night 155 COPD kidney problems  Is this a behavioral health or substance abuse call? ---No     Guidelines    Guideline Title Affirmed Question Affirmed Notes  Hip Injury Sounds like a serious injury to the triager    Final Disposition User   Go to ED Now Harlon Flor, RN, Darl Pikes    Referrals  [facility] - ED   Disagree/Comply: Comply

## 2016-07-03 NOTE — Telephone Encounter (Signed)
See below

## 2016-07-03 NOTE — Telephone Encounter (Signed)
Pt seen in ED on 06/30/16

## 2016-07-04 ENCOUNTER — Telehealth: Payer: Self-pay | Admitting: Internal Medicine

## 2016-07-04 NOTE — Telephone Encounter (Signed)
Pt would like for you to know that her Bp is elevated.  Pt would like for you to know that she has a black eye and she is putting ice on it.

## 2016-07-04 NOTE — Telephone Encounter (Signed)
Spoke to the pt.  She claimed bp was 169/91 before 11:30.  9/11 bp reading was 180/99.  Day before 130/60 and 06/30/16 155/86.  All bp readings were taken using a wrist monitor.  Pt continues to take losartan once daily.  She continues to feel feel tired and having headaches.

## 2016-07-05 ENCOUNTER — Ambulatory Visit
Admission: RE | Admit: 2016-07-05 | Discharge: 2016-07-05 | Disposition: A | Discharge status: Home / Self Care | Payer: Commercial Managed Care - HMO | Source: Ambulatory Visit | Attending: Orthopaedic Surgery | Admitting: Orthopaedic Surgery

## 2016-07-05 DIAGNOSIS — M5126 Other intervertebral disc displacement, lumbar region: Secondary | ICD-10-CM | POA: Diagnosis not present

## 2016-07-05 DIAGNOSIS — M50221 Other cervical disc displacement at C4-C5 level: Secondary | ICD-10-CM | POA: Diagnosis not present

## 2016-07-05 DIAGNOSIS — M50222 Other cervical disc displacement at C5-C6 level: Secondary | ICD-10-CM | POA: Diagnosis not present

## 2016-07-05 DIAGNOSIS — M47812 Spondylosis without myelopathy or radiculopathy, cervical region: Secondary | ICD-10-CM

## 2016-07-05 DIAGNOSIS — M4316 Spondylolisthesis, lumbar region: Secondary | ICD-10-CM

## 2016-07-06 NOTE — Telephone Encounter (Signed)
Spoke to the pt.  Informed her to take 2 losartan (50 mg) if bp staying above 140.  She will bring both her blood pressure machines when she comes in for next ov.

## 2016-07-06 NOTE — Telephone Encounter (Signed)
If bp stay elevated  Above 140 the increase the losartan  To  50 mg per day  ( 2 of the 25 mg )    Also why does she have a black eye ?   Keep next appt and bring in her BP monitor   To the visit

## 2016-07-11 DIAGNOSIS — F314 Bipolar disorder, current episode depressed, severe, without psychotic features: Secondary | ICD-10-CM | POA: Diagnosis not present

## 2016-07-12 ENCOUNTER — Other Ambulatory Visit (INDEPENDENT_AMBULATORY_CARE_PROVIDER_SITE_OTHER): Payer: Commercial Managed Care - HMO

## 2016-07-12 DIAGNOSIS — I10 Essential (primary) hypertension: Secondary | ICD-10-CM | POA: Diagnosis not present

## 2016-07-12 DIAGNOSIS — E119 Type 2 diabetes mellitus without complications: Secondary | ICD-10-CM

## 2016-07-12 LAB — BASIC METABOLIC PANEL
BUN: 17 mg/dL (ref 6–23)
CO2: 29 mEq/L (ref 19–32)
Calcium: 9.3 mg/dL (ref 8.4–10.5)
Chloride: 106 mEq/L (ref 96–112)
Creatinine, Ser: 1.51 mg/dL — ABNORMAL HIGH (ref 0.40–1.20)
GFR: 44.58 mL/min — ABNORMAL LOW (ref >60.00)
Glucose, Bld: 94 mg/dL (ref 70–99)
Potassium: 4 mEq/L (ref 3.5–5.1)
Sodium: 142 mEq/L (ref 135–145)

## 2016-07-12 LAB — HEMOGLOBIN A1C: Hgb A1c MFr Bld: 6.1 % (ref 4.6–6.5)

## 2016-07-14 DIAGNOSIS — M25552 Pain in left hip: Secondary | ICD-10-CM | POA: Diagnosis not present

## 2016-07-14 DIAGNOSIS — M545 Low back pain: Secondary | ICD-10-CM | POA: Diagnosis not present

## 2016-07-14 DIAGNOSIS — M542 Cervicalgia: Secondary | ICD-10-CM | POA: Diagnosis not present

## 2016-07-17 ENCOUNTER — Other Ambulatory Visit: Payer: Self-pay | Admitting: Internal Medicine
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
## 2016-07-19 ENCOUNTER — Encounter: Payer: Self-pay | Admitting: Internal Medicine

## 2016-07-19 ENCOUNTER — Ambulatory Visit (INDEPENDENT_AMBULATORY_CARE_PROVIDER_SITE_OTHER): Payer: Commercial Managed Care - HMO | Admitting: Internal Medicine

## 2016-07-19 VITALS — BP 130/80 | HR 92 | Temp 99.0°F | Resp 20 | Ht 63.0 in | Wt 157.0 lb

## 2016-07-19 DIAGNOSIS — G4710 Hypersomnia, unspecified: Secondary | ICD-10-CM

## 2016-07-19 DIAGNOSIS — N289 Disorder of kidney and ureter, unspecified: Secondary | ICD-10-CM

## 2016-07-19 DIAGNOSIS — Z9181 History of falling: Secondary | ICD-10-CM | POA: Diagnosis not present

## 2016-07-19 DIAGNOSIS — M4692 Unspecified inflammatory spondylopathy, cervical region: Secondary | ICD-10-CM

## 2016-07-19 DIAGNOSIS — R221 Localized swelling, mass and lump, neck: Secondary | ICD-10-CM | POA: Diagnosis not present

## 2016-07-19 DIAGNOSIS — E1122 Type 2 diabetes mellitus with diabetic chronic kidney disease: Secondary | ICD-10-CM

## 2016-07-19 DIAGNOSIS — M47812 Spondylosis without myelopathy or radiculopathy, cervical region: Secondary | ICD-10-CM

## 2016-07-19 NOTE — Patient Instructions (Signed)
Uncertain if the area of your neck is a salivary gland or glands. We'll work on Hotel manager nose and throat doctor to check that out before your next surgery. Don't be afraid to get a second opinion as far as your spine surgery upcoming.  reassuring that your kidney function is stable there is no anemia. Your B12 level is very high stop taking the B12 for a week and then go to no more than 1000 per day. Uses steady step stool when you have to workup high or get someone to help you. Your diabetes is in very good control.

## 2016-07-19 NOTE — Progress Notes (Signed)
Pre visit review using our clinic review tool, if applicable. No additional management support is needed unless otherwise documented below in the visit note. 

## 2016-07-24 DIAGNOSIS — R490 Dysphonia: Secondary | ICD-10-CM | POA: Diagnosis not present

## 2016-07-24 DIAGNOSIS — J029 Acute pharyngitis, unspecified: Secondary | ICD-10-CM | POA: Diagnosis not present

## 2016-07-24 DIAGNOSIS — K1122 Acute recurrent sialoadenitis: Secondary | ICD-10-CM | POA: Diagnosis not present

## 2016-07-26 ENCOUNTER — Encounter (HOSPITAL_COMMUNITY)
Admission: RE | Admit: 2016-07-26 | Discharge: 2016-07-26 | Disposition: A | Discharge status: Home / Self Care | Payer: Commercial Managed Care - HMO | Source: Ambulatory Visit | Attending: Orthopaedic Surgery | Admitting: Orthopaedic Surgery

## 2016-07-26 ENCOUNTER — Encounter (HOSPITAL_COMMUNITY): Payer: Self-pay | Admitting: Vascular Surgery

## 2016-07-26 ENCOUNTER — Encounter (HOSPITAL_COMMUNITY): Payer: Self-pay | Admitting: Certified Registered Nurse Anesthetist

## 2016-07-26 ENCOUNTER — Encounter (HOSPITAL_COMMUNITY): Payer: Self-pay

## 2016-07-26 DIAGNOSIS — J449 Chronic obstructive pulmonary disease, unspecified: Secondary | ICD-10-CM | POA: Diagnosis not present

## 2016-07-26 DIAGNOSIS — K219 Gastro-esophageal reflux disease without esophagitis: Secondary | ICD-10-CM | POA: Diagnosis not present

## 2016-07-26 DIAGNOSIS — Z01810 Encounter for preprocedural cardiovascular examination: Secondary | ICD-10-CM | POA: Diagnosis not present

## 2016-07-26 DIAGNOSIS — M1990 Unspecified osteoarthritis, unspecified site: Secondary | ICD-10-CM | POA: Diagnosis not present

## 2016-07-26 DIAGNOSIS — I129 Hypertensive chronic kidney disease with stage 1 through stage 4 chronic kidney disease, or unspecified chronic kidney disease: Secondary | ICD-10-CM | POA: Diagnosis not present

## 2016-07-26 DIAGNOSIS — E785 Hyperlipidemia, unspecified: Secondary | ICD-10-CM | POA: Diagnosis not present

## 2016-07-26 DIAGNOSIS — R079 Chest pain, unspecified: Secondary | ICD-10-CM | POA: Insufficient documentation

## 2016-07-26 DIAGNOSIS — E1122 Type 2 diabetes mellitus with diabetic chronic kidney disease: Secondary | ICD-10-CM | POA: Diagnosis not present

## 2016-07-26 DIAGNOSIS — N183 Chronic kidney disease, stage 3 (moderate): Secondary | ICD-10-CM | POA: Diagnosis not present

## 2016-07-26 DIAGNOSIS — Z01812 Encounter for preprocedural laboratory examination: Secondary | ICD-10-CM | POA: Insufficient documentation

## 2016-07-26 DIAGNOSIS — M79609 Pain in unspecified limb: Secondary | ICD-10-CM | POA: Diagnosis not present

## 2016-07-26 DIAGNOSIS — M545 Low back pain: Secondary | ICD-10-CM | POA: Diagnosis not present

## 2016-07-26 DIAGNOSIS — G8929 Other chronic pain: Secondary | ICD-10-CM | POA: Insufficient documentation

## 2016-07-26 DIAGNOSIS — I059 Rheumatic mitral valve disease, unspecified: Secondary | ICD-10-CM | POA: Diagnosis not present

## 2016-07-26 HISTORY — DX: Personal history of other specified conditions: Z87.898

## 2016-07-26 HISTORY — DX: Chronic obstructive pulmonary disease, unspecified: J44.9

## 2016-07-26 HISTORY — DX: Anxiety disorder, unspecified: F41.9

## 2016-07-26 HISTORY — DX: Chronic kidney disease, unspecified: N18.9

## 2016-07-26 HISTORY — DX: Essential (primary) hypertension: I10

## 2016-07-26 LAB — CBC
HCT: 39.3 % (ref 36.0–46.0)
Hemoglobin: 12.5 g/dL (ref 12.0–15.0)
MCH: 29.3 pg (ref 26.0–34.0)
MCHC: 31.8 g/dL (ref 30.0–36.0)
MCV: 92 fL (ref 78.0–100.0)
Platelets: 296 10*3/uL (ref 150–400)
RBC: 4.27 MIL/uL (ref 3.87–5.11)
RDW: 14 % (ref 11.5–15.5)
WBC: 6.2 10*3/uL (ref 4.0–10.5)

## 2016-07-26 LAB — APTT: aPTT: 34 seconds (ref 24–36)

## 2016-07-26 LAB — COMPREHENSIVE METABOLIC PANEL
ALT: 19 U/L (ref 14–54)
AST: 26 U/L (ref 15–41)
Albumin: 3.6 g/dL (ref 3.5–5.0)
Alkaline Phosphatase: 69 U/L (ref 38–126)
Anion gap: 8 (ref 5–15)
BUN: 10 mg/dL (ref 6–20)
CO2: 25 mmol/L (ref 22–32)
Calcium: 9.5 mg/dL (ref 8.9–10.3)
Chloride: 105 mmol/L (ref 101–111)
Creatinine, Ser: 1.26 mg/dL — ABNORMAL HIGH (ref 0.44–1.00)
GFR calc Af Amer: 51 mL/min — ABNORMAL LOW (ref >60)
GFR calc non Af Amer: 44 mL/min — ABNORMAL LOW (ref >60)
Glucose, Bld: 103 mg/dL — ABNORMAL HIGH (ref 65–99)
Potassium: 4 mmol/L (ref 3.5–5.1)
Sodium: 138 mmol/L (ref 135–145)
Total Bilirubin: 0.6 mg/dL (ref 0.3–1.2)
Total Protein: 7 g/dL (ref 6.5–8.1)

## 2016-07-26 LAB — GLUCOSE, CAPILLARY: Glucose-Capillary: 95 mg/dL (ref 65–99)

## 2016-07-26 LAB — URINALYSIS, ROUTINE W REFLEX MICROSCOPIC
Bilirubin Urine: NEGATIVE
Glucose, UA: NEGATIVE mg/dL
Hgb urine dipstick: NEGATIVE
Ketones, ur: NEGATIVE mg/dL
Leukocytes, UA: NEGATIVE
Nitrite: NEGATIVE
Protein, ur: NEGATIVE mg/dL
Specific Gravity, Urine: 1.029 (ref 1.005–1.030)
pH: 6 (ref 5.0–8.0)

## 2016-07-26 LAB — SURGICAL PCR SCREEN
MRSA, PCR: NEGATIVE
Staphylococcus aureus: NEGATIVE

## 2016-07-26 LAB — PROTIME-INR
INR: 0.99
Prothrombin Time: 13.1 seconds (ref 11.4–15.2)

## 2016-07-26 NOTE — Progress Notes (Signed)
Anesthesia PAT Evaluation: Patient is a 65 year old female scheduled for C5-6 ACDF on 08/02/16 by Dr. Ophelia Charter. I evaluated her during her 07/27/16 PAT visit.  History includes former smoker, HLD, GERD, hiatal hernia, Bipolar disorder, anxiety, neuroleptic induced tardive dyskinesia, DM2 (diet controlled), COPD, CKD stage III, HTN, back fusion '02 and '11.  - PCP is Dr. Fabian Sharp, last visit 07/19/16. She was referred to ENT for left neck localized swelling to be evaluated by ENT. Patient reports that she saw Dr. Dillard Cannon and was started on Keflex (to be completed by surgery date). She has not had any fever, dysphagia, or SOB. She did notice some mild hoarseness after a mild URI which she fells in improving.  - She saw cardiologist Dr. Patty Sermons for chest pain in 2016 and had a low risk stress test. - GI is Dr. Loreta Ave. - Nephrologist is Dr. Allena Katz.   Meds include Lipitor, Keflex X 7 days (started 07/24/16), Zyrtec, Cymbalta, Flonase, Lamictal, Ativan, losartan, omega 3, Protonix, IBguard and FBguard (non-formulary supplement for IBS).   BP 126/72   Pulse 90   Temp 36.8 C   Resp 20   Ht 5\' 3"  (1.6 m)   Wt 159 lb 6.4 oz (72.3 kg)   SpO2 99%   BMI 28.24 kg/m  She is a pleasant black female in NAD. Her daughter is being treated for stage IV cancer and is with her today, but waited outside in our lobby. Heart RRR, no murmur noted. Lungs clear. No ankle edema. No carotid bruits noted. She has upper dentures and partial lower. Patient reports chronic pain issues dating back to a job related injury in 1981.  Pain has been worse over the past several months. She has had back of head/upper posterior neck pain and numbness that radiates down both arms (L>R) and sometimes even down to her feet since at least 09/13/15. She was evaluated in the ED at that time and felt there was low suspicion for cardiac etiology and most consistent with radiculopathy. Brain MRI was also negative for acute infarct (small vessel  disease, and moderate paranasal sinus disease noted). Had follow-up with Dr. 09/15/15 with consideration to ortho or neurosurgery. ENT referral as well for sinus disease since patient with symptoms of dizziness. On 06/06/16, she also saw Dr. 06/08/16 for increasing pain in her upper abdomen/epigastric region. She describes it has a pain that can start in either the front or back and radiate all across her upper abdomen/lower chest (points to epigastric region) that follows along her rig cage to her mid back. Pain is not associated with activity. It is fairly constant, but occasionally she will have sharp pains that make her holler out in pain. Last episode on Tuesday night under her right breast. Dr. Saturday had referred her to GI due to the abdominal pain and also to orthopedics due to the mid thoracic region pain component. Patient has since seen Dr. Fabian Sharp who prescribed additional medications for IBS as she was having bloating as well. She also had MRI's of her cervical and lumbar spines. ACDF was recommended. She says she may eventually need another back surgery as well. She also has right hip pain that is being treated currently with intermittent injections with at least some temporary relief. She reports an unsteady gait. She uses a cane to walk, but currently it is lost. She fell 06/30/16 and sustained a chest wall contusion and golf ball sized hematoma over her left eyebrow. No LOC by ED note. (She  still has some ecchymosis around her left eye, but no swelling noted.)   07/26/16 EKG: NSR. QT 464 ms, QTc 478 ms.  01/05/15 Nuclear stress test: Overall Impression:   Low risk stress nuclear study with fixed inferolateral defect, which is likely attenuation artifact -adjacent hot bowel is noted. No significant ischemia. LV Ejection Fraction: 70%.  LV Wall Motion:  Normal Wall Motion.   01/31/11 Echo: Study Conclusions - Left ventricle: The cavity size was normal. Wall thickness wasincreased in a pattern of m      oderate LVH. Systolic function was normal. The estimated ejection fraction was in the range of 55% to60%. Wall motion was normal; there were no regional wall motion abnormalities. Left ventricular diastolic function parameters were normal. - Mitral valve: Mild regurgitation. - Atrial septum: No defect or patent foramen ovale was identified. - Pericardium, extracardiac: A trivial pericardial effusion was identified.  06/30/16 CXR: IMPRESSION: No active cardiopulmonary disease.  07/05/16 MRI C-spine:IMPRESSION: 1. Arthropathy of the C1-2 articulation with edema. This could be associated with neck pain. No neural compression. 2. Facet arthropathy could be associated with neck pain. This is more severe on the right at C3-4 and C6-7 and more severe on the left at C4-5, C5-6 and C7-T1. Degenerative spondylosis most pronounced at C5-6, right worse than left. 3. No cord compression. Foraminal stenosis could cause neural compression on the right at C3-4, on the left at C4-5, and bilaterally at C5-6.  07/05/16 MRI L-spine: IMPRESSION: 1. Previous diskectomy, decompression and fusion at L4-5. Sufficient patency. 2.Adjacent segment degenerative disease at L3-4 with disc bulge and bilateral facet and ligamentous hypertrophy. Mild narrowing of the lateral recesses but without visible neural compression. 3. Advanced bilateral facet arthropathy at L5-S1 with gaping, fluid-filled facet joints. 2 mm of anterolisthesis that likely worsens with standing or flexion. 6 mm synovial cyst projecting inward from the facet on the left. Bilateral lateral recess stenosis and foraminal stenosis that could cause neural compression. This appearance would likely worsen with standing or flexion. 4. Compared to the previous study, the surgical changes at L4-5 are newly seen. L3-4 appears quite similar. The degenerative changes at L5-S1 have worsened markedly.  Preoperative labs noted. Cr 1.26, stable. Glucose 103. CBC, PT/PTT WNL.  A1c 07/12/16 was 6.1. She told her PAT RN that she did not want blood products unless it is an emergency.  Patient with chronic pain as outlined above. Has had ED and PCP evaluation. ED work-up negative. Also with low risk stress test last year. Felt likely radiculopathy. Has been referred to ortho as well as GI. No recent changes in her symptoms. Has pain all the time with intermittent shooting pains that wraps around from her mid back to along her rib cage and pain along her posterior neck that radiates to both arms and sometimes to her feet. Symptoms not associated with activity. GI has given meds to help with abdominal bloating/IBS. Ortho has recommended the above surgery. If no acute changes then I anticipate that she can proceed as planned. I did leave a voice message with Elnita Maxwell at Dr. Ophelia Charter office that patient was currently on Keflex per ENT.   Velna Ochs [facility] Short Stay Center/Anesthesiology Phone [phone] 07/27/2016 11:35 AM

## 2016-07-26 NOTE — Progress Notes (Signed)
PCP: Dr. Fabian Sharp Dr. Allena Katz seen for kidneys.  Pt previously saw Dr. Patty Sermons 12/2014 for cardiac workup Stress test: 01/05/15 EKG: 09/15/15, repeat today 07/26/16  Pt with no current complaints of chest pain, but states she had chest pain last night. Sharp pain under her breasts. Shonna Chock, PA notified.   Pt states she wishes to be DNR and does not want a blood transfusion unless if is an emergency.  Pt states she saw Dr. Ezzard Standing for a problem with her throat for which she is on antibiotics for. Records requested from Dr. Allene Pyo office

## 2016-07-26 NOTE — Pre-Procedure Instructions (Signed)
[facility]  07/26/2016      [id number] - [place], [place] - [address] AT [facility] OF [address] [place] [address] Phone: [phone] Fax: [phone]  [facility] - [place], [place] - [address] [place] [address] Phone: [phone] Fax: [phone]  [facility] - [place], [place] - [address] [place] [address] Phone: [phone] Fax: [phone]  [facility] - [place], [place] - [address] [place] [address] Phone: [phone] Fax: [phone]    Your procedure is scheduled on Wednesday October 11.  Report to [facility] Admitting at 10:30 A.M.  Call this number if you have problems the morning of surgery:  [phone]   Remember:  Do not eat food or drink liquids after midnight.  Take these medicines the morning of surgery with A SIP OF WATER: pantoprazole (protonix), acetaminophen (tylenol) if needed, cephalexin (Keflex), cetirizine (Zyrtec), duloxetin (Cymbalta), Nasonex if needed, Flonase if needed, zofran if needed  7 days prior to surgery STOP taking any Aspirin, Aleve, Naproxen, Ibuprofen, Motrin, Advil, Goody's, BC's, all herbal medications, fish oil, and all vitamins .  Marland Kitchen    How to Manage Your Diabetes Before and After Surgery  Why is it important to control my blood sugar before and after surgery? . Improving blood sugar levels before and after surgery helps healing and can limit problems. . A way of improving blood sugar control is eating a healthy diet by: o  Eating less sugar and carbohydrates o  Increasing activity/exercise o  Talking with your doctor about reaching your blood sugar goals . High blood sugars (greater than 180 mg/dL) can raise your risk of infections and slow your recovery, so you will need to  focus on controlling your diabetes during the weeks before surgery. . Make sure that the doctor who takes care of your diabetes knows about your planned surgery including the date and location.  How do I manage my blood sugar before surgery? . Check your blood sugar at least 4 times a day, starting 2 days before surgery, to make sure that the level is not too high or low. o Check your blood sugar the morning of your surgery when you wake up and every 2 hours until you get to the Short Stay unit. . If your blood sugar is less than 70 mg/dL, you will need to treat for low blood sugar: o Do not take insulin. o Treat a low blood sugar (less than 70 mg/dL) with  cup of clear juice (cranberry or apple), 4 glucose tablets, OR glucose gel. o Recheck blood sugar in 15 minutes after treatment (to make sure it is greater than 70 mg/dL). If your blood sugar is not greater than 70 mg/dL on recheck, call [phone] for further instructions. . Report your blood sugar to the short stay nurse when you get to Short Stay.  . If you are admitted to the hospital after surgery: o Your blood sugar will be checked by the staff and you will probably be given insulin after surgery (instead of oral diabetes medicines) to make sure you have good blood sugar levels. o The goal for blood sugar control after surgery is 80-180 mg/dL.                 Do not wear jewelry, make-up or nail  polish.  Do not wear lotions, powders, or perfumes, or deoderant.  Do not shave 48 hours prior to surgery.    Do not bring valuables to the hospital.  [facility] is not responsible for any belongings or valuables.  Contacts, dentures or bridgework may not be worn into surgery.  Leave your suitcase in the car.  After surgery it may be brought to your room.  For patients admitted to the hospital, discharge time will be determined by your treatment team.  Patients discharged the day of surgery will not be allowed to drive  home.    Special instructions:    - Preparing For Surgery  Before surgery, you can play an important role. Because skin is not sterile, your skin needs to be as free of germs as possible. You can reduce the number of germs on your skin by washing with CHG (chlorahexidine gluconate) Soap before surgery.  CHG is an antiseptic cleaner which kills germs and bonds with the skin to continue killing germs even after washing.  Please do not use if you have an allergy to CHG or antibacterial soaps. If your skin becomes reddened/irritated stop using the CHG.  Do not shave (including legs and underarms) for at least 48 hours prior to first CHG shower. It is OK to shave your face.  Please follow these instructions carefully.   1. Shower the NIGHT BEFORE SURGERY and the MORNING OF SURGERY with CHG.   2. If you chose to wash your hair, wash your hair first as usual with your normal shampoo.  3. After you shampoo, rinse your hair and body thoroughly to remove the shampoo.  4. Use CHG as you would any other liquid soap. You can apply CHG directly to the skin and wash gently with a scrungie or a clean washcloth.   5. Apply the CHG Soap to your body ONLY FROM THE NECK DOWN.  Do not use on open wounds or open sores. Avoid contact with your eyes, ears, mouth and genitals (private parts). Wash genitals (private parts) with your normal soap.  6. Wash thoroughly, paying special attention to the area where your surgery will be performed.  7. Thoroughly rinse your body with warm water from the neck down.  8. DO NOT shower/wash with your normal soap after using and rinsing off the CHG Soap.  9. Pat yourself dry with a CLEAN TOWEL.   10. Wear CLEAN PAJAMAS   11. Place CLEAN SHEETS on your bed the night of your first shower and DO NOT SLEEP WITH PETS.    Day of Surgery: Do not apply any deodorants/lotions. Please wear clean clothes to the hospital/surgery center.      Please read over the  following fact sheets that you were given. MRSA Information

## 2016-08-01 NOTE — Progress Notes (Signed)
Patient called stated she had a family emergency and would not be in for surgery on tomorrow. Requested that patient contact Dr. Marlene Bast office in am.

## 2016-08-02 ENCOUNTER — Encounter (HOSPITAL_COMMUNITY): Admission: RE | Payer: Self-pay | Source: Ambulatory Visit

## 2016-08-02 ENCOUNTER — Inpatient Hospital Stay (HOSPITAL_COMMUNITY)
Admission: RE | Admit: 2016-08-02 | Payer: Commercial Managed Care - HMO | Source: Ambulatory Visit | Admitting: Orthopaedic Surgery

## 2016-08-02 SURGERY — ANTERIOR CERVICAL DECOMPRESSION/DISCECTOMY FUSION 1 LEVEL
Anesthesia: General

## 2016-08-04 ENCOUNTER — Encounter (HOSPITAL_COMMUNITY): Payer: Self-pay | Admitting: *Deleted

## 2016-08-04 ENCOUNTER — Emergency Department (HOSPITAL_COMMUNITY): Payer: Commercial Managed Care - HMO

## 2016-08-04 DIAGNOSIS — M79674 Pain in right toe(s): Secondary | ICD-10-CM | POA: Diagnosis not present

## 2016-08-04 DIAGNOSIS — N189 Chronic kidney disease, unspecified: Secondary | ICD-10-CM | POA: Diagnosis not present

## 2016-08-04 DIAGNOSIS — Z87891 Personal history of nicotine dependence: Secondary | ICD-10-CM | POA: Diagnosis not present

## 2016-08-04 DIAGNOSIS — W010XXA Fall on same level from slipping, tripping and stumbling without subsequent striking against object, initial encounter: Secondary | ICD-10-CM | POA: Insufficient documentation

## 2016-08-04 DIAGNOSIS — Y9301 Activity, walking, marching and hiking: Secondary | ICD-10-CM | POA: Diagnosis not present

## 2016-08-04 DIAGNOSIS — Y929 Unspecified place or not applicable: Secondary | ICD-10-CM | POA: Insufficient documentation

## 2016-08-04 DIAGNOSIS — I129 Hypertensive chronic kidney disease with stage 1 through stage 4 chronic kidney disease, or unspecified chronic kidney disease: Secondary | ICD-10-CM | POA: Diagnosis not present

## 2016-08-04 DIAGNOSIS — J449 Chronic obstructive pulmonary disease, unspecified: Secondary | ICD-10-CM | POA: Insufficient documentation

## 2016-08-04 DIAGNOSIS — E119 Type 2 diabetes mellitus without complications: Secondary | ICD-10-CM | POA: Insufficient documentation

## 2016-08-04 DIAGNOSIS — Y999 Unspecified external cause status: Secondary | ICD-10-CM | POA: Insufficient documentation

## 2016-08-04 DIAGNOSIS — M79671 Pain in right foot: Secondary | ICD-10-CM | POA: Diagnosis not present

## 2016-08-04 DIAGNOSIS — Z5321 Procedure and treatment not carried out due to patient leaving prior to being seen by health care provider: Secondary | ICD-10-CM | POA: Diagnosis not present

## 2016-08-04 NOTE — ED Triage Notes (Signed)
Patient is alert and oriented x4.  She is complaining of right pinky toe pain with deformity after a trip fall while walking to the mailbox.  Currently she rates her pain 8 of 10.  Patient denies hitting her head or any LOC.

## 2016-08-05 ENCOUNTER — Emergency Department (HOSPITAL_COMMUNITY)
Admission: EM | Admit: 2016-08-05 | Discharge: 2016-08-05 | Disposition: A | Discharge status: Home / Self Care | Payer: Commercial Managed Care - HMO | Attending: Emergency Medicine | Admitting: Emergency Medicine

## 2016-08-16 ENCOUNTER — Inpatient Hospital Stay (INDEPENDENT_AMBULATORY_CARE_PROVIDER_SITE_OTHER): Payer: Commercial Managed Care - HMO | Admitting: Orthopaedic Surgery

## 2016-08-16 ENCOUNTER — Telehealth (INDEPENDENT_AMBULATORY_CARE_PROVIDER_SITE_OTHER): Payer: Self-pay | Admitting: Orthopaedic Surgery

## 2016-08-16 NOTE — Telephone Encounter (Signed)
Patient had to postpone surgery and spoke with Dr. Ophelia Charter the night her daughter was hospitalized on the day before surgery 08/02/2016 and wants to know the earlier that she can have surgery.  Contact Info: [phone]

## 2016-08-16 NOTE — Telephone Encounter (Signed)
Please see note.

## 2016-08-18 ENCOUNTER — Other Ambulatory Visit: Payer: Self-pay | Admitting: Internal Medicine

## 2016-08-18 MED ORDER — GLUCOSE BLOOD VI STRP: ORAL_STRIP | 1 refills | Status: DC

## 2016-08-18 NOTE — Telephone Encounter (Signed)
Sent to the pharmacy by e-scribe. 

## 2016-08-21 ENCOUNTER — Telehealth: Payer: Self-pay | Admitting: Internal Medicine

## 2016-08-21 NOTE — Telephone Encounter (Signed)
Pt states no need to call [facility] for test strips.  Pt states please make sure you send the nano test strips next time. Pt has arranged to get from medicare accu check customer care center

## 2016-08-21 NOTE — Addendum Note (Signed)
Addended by: Raj Janus T on: 08/21/2016 04:58 PM   Modules accepted: Orders

## 2016-08-21 NOTE — Telephone Encounter (Signed)
Noted  

## 2016-08-21 NOTE — Telephone Encounter (Signed)
Pt was sent the wrong Rx in for her test strips pt need test strips for Accu chek [employer] [facility] [place].

## 2016-08-25 ENCOUNTER — Telehealth: Payer: Self-pay | Admitting: Internal Medicine

## 2016-08-25 DIAGNOSIS — Z7689 Persons encountering health services in other specified circumstances: Secondary | ICD-10-CM

## 2016-08-25 NOTE — Telephone Encounter (Signed)
Pt would like to know if she will need a referral to the following:    Dr. Shawnee Knapp  (Nov) *  Dr. Ophelia Charter  (to determine if surgery is needed)  (Nov)  Dr. Allena Katz (Dec) *kidney specialist*  Dr. Harlon Flor **eye doctor* will call back with date of appt.   pls call back on Monday.

## 2016-08-28 NOTE — Telephone Encounter (Signed)
Spoke to the pt.  Instructed her to call her insurance company and find out which physicians needed referrals.  She will call back to let me know.  Will keep note open.

## 2016-08-29 NOTE — Telephone Encounter (Signed)
Pt state that she needs referrals to all of the doctors below and also to Dr. Terri Piedra (gynecologist).  Pt would like to have a call back.

## 2016-08-30 NOTE — Telephone Encounter (Signed)
I called [name]. We discussed rescheduling her surgery. However, she mentioned that she would need to see Dr. Ophelia Charter first for her foot. She injured this a week or so ago and has not had any treatment.  I made her an appt with Dr. Ophelia Charter and advised that we could reschedule surgery while she was in the office.

## 2016-08-31 ENCOUNTER — Telehealth (INDEPENDENT_AMBULATORY_CARE_PROVIDER_SITE_OTHER): Payer: Self-pay | Admitting: Orthopaedic Surgery

## 2016-08-31 NOTE — Telephone Encounter (Signed)
Noted. There is not a sooner appt in system. Will discuss surgery at office visit on 11/17

## 2016-08-31 NOTE — Telephone Encounter (Signed)
Patient called advised she have not had surgery yet and would like to come in and talk to Dr Ophelia Charter about rescheduling her surgery. Patient is scheduled to come in 09/08/16 @ 2:30am.   Patient was scheduled for 09/06/16  But, that appt. was canceled. The number to contact her is [phone]

## 2016-09-01 NOTE — Telephone Encounter (Signed)
Spoke to the pt.  She advised me of dates for all referrals.  Placed in the Epic system.

## 2016-09-05 DIAGNOSIS — F314 Bipolar disorder, current episode depressed, severe, without psychotic features: Secondary | ICD-10-CM | POA: Diagnosis not present

## 2016-09-06 ENCOUNTER — Telehealth: Payer: Self-pay | Admitting: Internal Medicine

## 2016-09-06 ENCOUNTER — Inpatient Hospital Stay (INDEPENDENT_AMBULATORY_CARE_PROVIDER_SITE_OTHER): Payer: Commercial Managed Care - HMO | Admitting: Orthopaedic Surgery

## 2016-09-06 NOTE — Telephone Encounter (Signed)
Ok   Noted  Does she want /need me to do a follow up  Of some sort . ?

## 2016-09-06 NOTE — Telephone Encounter (Signed)
Pt stated that she went to her psychiatrist on yesterday and he wanted her to report to her PCP that she had SB, headache and chest pain and he stated that she had had a panic attack.  Pt state she has not had a panic attack in awhile this was only b/c of a incident with her daughter prior to visit.

## 2016-09-07 NOTE — Telephone Encounter (Signed)
Spoke to the pt.  Pt states she was very upset with her daughter at the time.  Daughter was living with her and [name] states she was spreading lies about her.  Now out of her house and Desiree [name] is doing better.  She states she does not need a follow up at this time.  Will forward to [facility] for FYI.

## 2016-09-07 NOTE — Telephone Encounter (Signed)
Noted  

## 2016-09-08 ENCOUNTER — Ambulatory Visit (INDEPENDENT_AMBULATORY_CARE_PROVIDER_SITE_OTHER): Payer: Commercial Managed Care - HMO

## 2016-09-08 ENCOUNTER — Ambulatory Visit (INDEPENDENT_AMBULATORY_CARE_PROVIDER_SITE_OTHER): Payer: Commercial Managed Care - HMO | Admitting: Orthopaedic Surgery

## 2016-09-08 DIAGNOSIS — M79671 Pain in right foot: Secondary | ICD-10-CM

## 2016-09-08 DIAGNOSIS — S92511A Displaced fracture of proximal phalanx of right lesser toe(s), initial encounter for closed fracture: Secondary | ICD-10-CM

## 2016-09-08 DIAGNOSIS — M4802 Spinal stenosis, cervical region: Secondary | ICD-10-CM | POA: Diagnosis not present

## 2016-09-08 NOTE — Progress Notes (Signed)
Office Visit Note   Patient: Desiree [name]           Date of Birth: [birth year]           MRN: [id number] Visit Date: 09/08/2016              Requested by: Madelin Headings, MD [address] [place], [place] [address] PCP: Lorretta Harp, MD   Assessment & Plan: Visit Diagnoses:  1. Pain in right foot   2. Closed displaced fracture of proximal phalanx of lesser toe of right foot, initial encounter   3. Spinal stenosis of cervical region     Plan: Patient states that no that her daughters finished the the last of chemotherapy treatment for metastatic disease is rated proceed with cervical surgery. Conservative treatment was discussed for the toe fracture which is in satisfactory position. We discussed single level anterior cervical fusion with overnight stay risks of surgery. I gave her copy of the report reviewed the images again. Progressive dysphasia and dysphonia potential for pseudoarthrosis need for posterior cervical fusion of the anterior procedure did not heal and she remains symptomatic. Questions were elicited and answered. She understands request to proceed.  Follow-Up Instructions: No Follow-up on file.   Orders:  Orders Placed This Encounter  Procedures  . XR Foot Complete Right   No orders of the defined types were placed in this encounter.     Procedures: No procedures performed   Clinical Data: No additional findings.   Subjective: Chief Complaint  Patient presents with  . Right Foot - Pain, Injury    States she fell, and wants her right foot checked. DOI 08/05/16. Tripped off sidewalk. She did go the ER, they obtained xrays. She states it is still pretty painful.  . Neck - Pain    Patient here today because her neck surgery was cancelled. She wants to discuss rescheduling. She also wants to discuss her right 5th toe. She had fallen and tripped over sidewalk about a month ago. She went to the ER and they told her possible fracture vs.  Deformity. She is still hurting quite badly.    Injury  Pertinent negatives include no abdominal pain, chest pain, coughing, seizures or visual disturbance.   Patient was scheduled for anterior cervical C5-6 but canceled due to her daughter being admitted to the hospital with metastatic cancer. She is undergoing treatment at this time. Patient states that one her toes initially to form she reached down and twisted it back straight and she's continued to have pain still using a walker and states that at times her right leg feels like it wants to give way with weakness. Review of Systems  Constitutional: Negative for chills and diaphoresis.  HENT: Negative for ear discharge, ear pain and nosebleeds.   Eyes: Negative for discharge and visual disturbance.  Respiratory: Negative for cough, choking and shortness of breath.        History of pneumonia  Cardiovascular: Negative for chest pain and palpitations.        hypertension and high cholesterol.  Gastrointestinal: Negative for abdominal distention and abdominal pain.  Endocrine: Negative for cold intolerance and heat intolerance.       Positive for diabetes  Genitourinary: Negative for flank pain and hematuria.       History of kidney disease  Musculoskeletal:       Previous lumbar instrumented fusion. Previous carpal tunnel release right and left bunion surgeries in the past. Lumbar fusion was at was  at L4-5.  Skin: Negative for rash and wound.  Neurological: Negative for seizures and speech difficulty.  Hematological: Negative for adenopathy. Does not bruise/bleed easily.  Psychiatric/Behavioral: Negative for agitation and suicidal ideas.        positive for anxiety and depression. Bipolar disorder.     Objective: Vital Signs: There were no vitals taken for this visit.  Physical Exam  Constitutional: She is oriented to person, place, and time. She appears well-developed.  HENT:  Head: Normocephalic.  Right Ear: External ear  normal.  Left Ear: External ear normal.  Eyes: Pupils are equal, round, and reactive to light.  Neck: No tracheal deviation present. No thyromegaly present.  Cardiovascular: Normal rate.   Pulmonary/Chest: Effort normal.  Abdominal: Soft.  Musculoskeletal:  Bronchial carpal tunnel incisions as well as bunion incision. Patient has pain with compression of the cervical spine radiates of the right side numbness and tingling in her hand. Upper extremity reflexes are 2+ and symmetrical no isolated motor weakness. Carpal compression is nontender. No lower extremity hyperreflexia. Normal heel toe gait with the cane. She has tenderness and swelling of the fifth toe tenderness over the proximal phalanx. No rotational deformity capillary refill is normal  Neurological: She is alert and oriented to person, place, and time.  Skin: Skin is warm and dry.  Psychiatric: She has a normal mood and affect. Her behavior is normal.    Ortho Exam patient has pain with cervical compression some relief with distraction no cervical lymphadenopathy positive brachial plexus tenderness more on the right than left. Swelling of the right the fifth toe with tenderness of the proximal phalanx  Specialty Comments:  No specialty comments available.  Imaging: Xr Foot Complete Right  Result Date: 09/08/2016 Three-view x-rays right foot obtained. This shows a fracture of the proximal phalanx of the fifth toe oblique through the shaft. There is minimal shortening. Previous the first metatarsal osteotomy fixed with 2 screws with complete healing Impression: Acute fracture right fifth toe proximal phalanx shaft fracture  MR Cervical Spine Wo Contrast (Accession [id number]) (Order [id number])  Imaging  Date: 07/05/2016 Department: [facility] AT [address] Released By: Lucia Gaskins Authorizing: Eldred Manges, MD  Exam Information   Status Exam Begun  Exam Ended   Final [99] 07/05/2016 5:05 PM 07/05/2016 6:07  PM  PACS Images   Show images for MR Cervical Spine Wo Contrast  Study Result   CLINICAL DATA:  Neck pain radiating to the left arm with weakness and numbness. Symptoms worsening over the last several months.  EXAM: MRI CERVICAL SPINE WITHOUT CONTRAST  TECHNIQUE: Multiplanar, multisequence MR imaging of the cervical spine was performed. No intravenous contrast was administered.  COMPARISON:  Radiography 06/06/2016.  MRI 03/18/2011.  FINDINGS: Alignment: Normal  Vertebrae: No fracture or focal bone lesion.  Cord: No cord compression or primary cord lesion.  Posterior Fossa, vertebral arteries, paraspinal tissues: Normal  Disc levels:  C1-2: Arthropathy at the C1-2 joint with some edema. This could be a cause of neck pain. No neural encroachment.  C2-3: Mild bulging of the disc.  No facet arthropathy.  No stenosis.  C3-4: Mild bulging of the disc. Facet degeneration worse on the right. No compressive central canal stenosis. Foraminal narrowing on the right that could affect the C4 nerve root.  C4-5: Mild bulging of the disc. Facet arthropathy left worse than right. No central canal stenosis. Foraminal narrowing on the left that could affect the C5 nerve root.  C5-6: Spondylosis with  endplate osteophytes and shallow herniation of disc material more prominent towards the right. Effacement of the ventral subarachnoid space on the right but no cord compression. Mild facet degeneration on the right and severe facet degeneration on the left. Foraminal stenosis bilaterally, on the right primarily on the basis of endplate osteophytes and disc herniation and on the left primarily on the basis of facet arthropathy. Either C6 nerve root could be compressed.  C6-7: Bulging of the disc. Facet arthropathy right worse than left. No compressive central canal narrowing. Mild foraminal narrowing on the right.  C7-T1: Mild bulging of the disc. Facet arthropathy left  worse than right. No canal or foraminal stenosis.  IMPRESSION: Arthropathy of the C1-2 articulation with edema. This could be associated with neck pain. No neural compression.  Facet arthropathy could be associated with neck pain. This is more severe on the right at C3-4 and C6-7 and more severe on the left at C4-5, C5-6 and C7-T1. Degenerative spondylosis most pronounced at C5-6, right worse than left.  No cord compression. Foraminal stenosis could cause neural compression on the right at C3-4, on the left at C4-5, and bilaterally at C5-6.   Electronically Signed   By: Paulina Fusi M.D.   On: 07/05/2016 18:55     PMFS History: Patient Active Problem List   Diagnosis Date Noted  . Closed displaced fracture of proximal phalanx of lesser toe of right foot 09/08/2016  . Spinal stenosis of cervical region 09/08/2016  . Polypharmacy 06/23/2015  . Hyperlipidemia 04/19/2015  . Visit for preventive health examination 01/01/2015  . Primary osteoarthritis involving multiple joints 01/01/2015  . Bipolar affective disorder, currently depressed, moderate (HCC)   . Bipolar I disorder with mania (HCC) 08/17/2014  . Foot cramps 07/03/2014  . Hypertension 10/21/2013  . Ear fullness 10/21/2013  . Dizziness 03/25/2013  . Medication withdrawal (HCC) 03/05/2013  . Diabetes mellitus with renal manifestations, controlled (HCC) 11/10/2012  . Alopecia areata 02/06/2012  . Obesity (BMI 30-39.9) 02/06/2012  . Renal insufficiency 07/16/2011  . Bipolar disorder (HCC)   . Neuroleptic-induced tardive dyskinesia   . Exertional dyspnea 02/07/2011  . Dyspnea on exertion 01/19/2011  . DIABETES MELLITUS, TYPE II, CONTROLLED 04/22/2010  . Carpal tunnel syndrome 02/02/2010  . CONSTIPATION 02/02/2010  . DEGENERATIVE JOINT DISEASE 02/02/2010  . TOBACCO USE, QUIT 07/28/2009  . CATARACTS 04/13/2009  . PAIN IN JOINT, ANKLE AND FOOT 09/16/2008  . EOSINOPHILIA 07/21/2008  . AFFECTIVE DISORDER  04/21/2008  . BACK PAIN, CHRONIC 02/12/2008  . LEG PAIN 02/12/2008  . ABNORMAL INVOLUNTARY MOVEMENTS 12/04/2007  . POSTURAL LIGHTHEADEDNESS 11/04/2007  . COLONIC POLYPS, HX OF 11/04/2007  . Essential hypertension 06/17/2007  . HYPERLIPIDEMIA 04/08/2007  . MITRAL VALVE PROLAPSE 04/08/2007  . GERD 04/08/2007  . LOW BACK PAIN SYNDROME 04/08/2007  . CHEST PAIN, RECURRENT 04/08/2007   Past Medical History:  Diagnosis Date  . Anxiety   . Bipolar disorder (HCC)   . CANDIDIASIS, ESOPHAGEAL 07/28/2009   Qualifier: Diagnosis of  By: Fabian Sharp MD, Neta Mends   . Chronic kidney disease    CKD III  . COPD (chronic obstructive pulmonary disease) (HCC)   . Depression   . Diabetes mellitus without complication (HCC)   . FH: colonic polyps   . Fractured elbow    right   . GERD (gastroesophageal reflux disease)   . HH (hiatus hernia)   . History of carpal tunnel syndrome   . History of chest pain   . History of transfusion of packed red  blood cells   . Hyperlipidemia   . Hypertension   . Neuroleptic-induced tardive dyskinesia   . Osteoarthritis of more than one site   . Seasonal allergies     Family History  Problem Relation Age of Onset  . Heart attack Father   . Heart disease Father   . Throat cancer Brother   . Diabetes Mother   . Hypertension Mother   . Heart attack Mother   . Diabetes type II Brother   . Heart disease Brother     Past Surgical History:  Procedure Laterality Date  . Back Fusion  2002  . CARPAL TUNNEL RELEASE  Kahron Kauth   left  . COLONOSCOPY N/A 01/05/2014   Procedure: COLONOSCOPY;  Surgeon: Charna Elizabeth, MD;  Location: [facility];  Service: Endoscopy;  Laterality: N/A;  . EXTERNAL EAR SURGERY Left   . EYE SURGERY     "removed white dots under eyelid"  . FINGER SURGERY Left   . Juvara osteomy    . KNEE SURGERY    . Rt. toe bunion    . skin, shave biopsy  05/03/2016   Left occipital scalp, top of scalp   Social History   Occupational History  . Not on file.     Social History Main Topics  . Smoking status: Former Smoker    Packs/day: 2.00    Years: 20.00    Types: Cigarettes    Quit date: 10/23/2002  . Smokeless tobacco: Never Used  . Alcohol use No  . Drug use: No  . Sexual activity: Not on file

## 2016-09-10 ENCOUNTER — Encounter (INDEPENDENT_AMBULATORY_CARE_PROVIDER_SITE_OTHER): Payer: Self-pay | Admitting: Orthopaedic Surgery

## 2016-09-13 ENCOUNTER — Telehealth: Payer: Self-pay | Admitting: Internal Medicine

## 2016-09-13 NOTE — Telephone Encounter (Signed)
FYI  Pt wanted to let you know the following:    Dr. Terri Piedra she will call after her surgery  Dr. Harlon Flor she will she him on 12/4  Dr. Ophelia Charter will be doing her surgery on 12/6 and returning to see him on 12/20  Dr. Allena Katz will see her on 12/21  Dr. Shawnee Knapp will see her on 12/12  And the [facility] pre-op will call her later.

## 2016-09-13 NOTE — Telephone Encounter (Signed)
Noted  

## 2016-09-18 ENCOUNTER — Telehealth: Payer: Self-pay | Admitting: Cardiology

## 2016-09-18 NOTE — Telephone Encounter (Signed)
LMTCB

## 2016-09-18 NOTE — Telephone Encounter (Signed)
°  New Prob   Pt is calling today to follow up on results from a myocardial perfusion performed on 01/05/15. Pt is not currently re-established with anyone. Former Dr. Patty Sermons pt.

## 2016-09-19 ENCOUNTER — Telehealth (INDEPENDENT_AMBULATORY_CARE_PROVIDER_SITE_OTHER): Payer: Self-pay | Admitting: Orthopedic Surgery

## 2016-09-19 NOTE — Telephone Encounter (Signed)
Noted. She gets instructions from me verbally after surgery and an instruction sheet printed.

## 2016-09-19 NOTE — Telephone Encounter (Signed)
LMTCB

## 2016-09-19 NOTE — Telephone Encounter (Signed)
I spoke to patient. She is requesting a copy of the report from 01/05/15 [facility]. I advised her someone from medical records will contact her shortly.

## 2016-09-19 NOTE — Telephone Encounter (Signed)
I spoke with Ms. Bellin this afternoon.  She left a message on my voicemail asking me to call back to discuss surgery. She is scheduled for C-spine surgery on 09/27/2016.  She asks that Dr. Ophelia Charter please not try and speak with her directly after surgery.  She would like to make sure she is fully "out from under anesthesia" when he gives her any post-op instructions or asks any questions.  She is fearful that her family may not relay the correct information to her if he speaks to them instead of her.  I advised that I would relay the message.

## 2016-09-25 ENCOUNTER — Encounter (HOSPITAL_COMMUNITY)
Admission: RE | Admit: 2016-09-25 | Discharge: 2016-09-25 | Disposition: A | Discharge status: Home / Self Care | Payer: Commercial Managed Care - HMO | Source: Ambulatory Visit | Attending: Orthopaedic Surgery | Admitting: Orthopaedic Surgery

## 2016-09-25 ENCOUNTER — Encounter (HOSPITAL_COMMUNITY): Payer: Self-pay

## 2016-09-25 ENCOUNTER — Telehealth (INDEPENDENT_AMBULATORY_CARE_PROVIDER_SITE_OTHER): Payer: Self-pay | Admitting: Orthopaedic Surgery

## 2016-09-25 ENCOUNTER — Encounter: Payer: Self-pay | Admitting: Internal Medicine

## 2016-09-25 DIAGNOSIS — Z880 Allergy status to penicillin: Secondary | ICD-10-CM | POA: Diagnosis not present

## 2016-09-25 DIAGNOSIS — E119 Type 2 diabetes mellitus without complications: Secondary | ICD-10-CM | POA: Diagnosis not present

## 2016-09-25 DIAGNOSIS — Z808 Family history of malignant neoplasm of other organs or systems: Secondary | ICD-10-CM | POA: Diagnosis not present

## 2016-09-25 DIAGNOSIS — M47812 Spondylosis without myelopathy or radiculopathy, cervical region: Secondary | ICD-10-CM | POA: Diagnosis not present

## 2016-09-25 DIAGNOSIS — H2513 Age-related nuclear cataract, bilateral: Secondary | ICD-10-CM | POA: Diagnosis not present

## 2016-09-25 DIAGNOSIS — M4802 Spinal stenosis, cervical region: Secondary | ICD-10-CM | POA: Diagnosis present

## 2016-09-25 DIAGNOSIS — E1122 Type 2 diabetes mellitus with diabetic chronic kidney disease: Secondary | ICD-10-CM | POA: Diagnosis present

## 2016-09-25 DIAGNOSIS — Z981 Arthrodesis status: Secondary | ICD-10-CM | POA: Diagnosis not present

## 2016-09-25 DIAGNOSIS — J449 Chronic obstructive pulmonary disease, unspecified: Secondary | ICD-10-CM | POA: Diagnosis present

## 2016-09-25 DIAGNOSIS — M542 Cervicalgia: Secondary | ICD-10-CM | POA: Diagnosis present

## 2016-09-25 DIAGNOSIS — M4722 Other spondylosis with radiculopathy, cervical region: Secondary | ICD-10-CM | POA: Diagnosis present

## 2016-09-25 DIAGNOSIS — Z7951 Long term (current) use of inhaled steroids: Secondary | ICD-10-CM | POA: Diagnosis not present

## 2016-09-25 DIAGNOSIS — F418 Other specified anxiety disorders: Secondary | ICD-10-CM | POA: Diagnosis not present

## 2016-09-25 DIAGNOSIS — Z8249 Family history of ischemic heart disease and other diseases of the circulatory system: Secondary | ICD-10-CM | POA: Diagnosis not present

## 2016-09-25 DIAGNOSIS — Z881 Allergy status to other antibiotic agents status: Secondary | ICD-10-CM | POA: Diagnosis not present

## 2016-09-25 DIAGNOSIS — M4322 Fusion of spine, cervical region: Secondary | ICD-10-CM | POA: Diagnosis not present

## 2016-09-25 DIAGNOSIS — F319 Bipolar disorder, unspecified: Secondary | ICD-10-CM | POA: Diagnosis present

## 2016-09-25 DIAGNOSIS — H40012 Open angle with borderline findings, low risk, left eye: Secondary | ICD-10-CM | POA: Diagnosis not present

## 2016-09-25 DIAGNOSIS — Z885 Allergy status to narcotic agent status: Secondary | ICD-10-CM | POA: Diagnosis not present

## 2016-09-25 DIAGNOSIS — Z87891 Personal history of nicotine dependence: Secondary | ICD-10-CM | POA: Diagnosis not present

## 2016-09-25 DIAGNOSIS — M4720 Other spondylosis with radiculopathy, site unspecified: Secondary | ICD-10-CM | POA: Diagnosis not present

## 2016-09-25 DIAGNOSIS — N183 Chronic kidney disease, stage 3 (moderate): Secondary | ICD-10-CM | POA: Diagnosis present

## 2016-09-25 DIAGNOSIS — M50922 Unspecified cervical disc disorder at C5-C6 level: Secondary | ICD-10-CM | POA: Diagnosis not present

## 2016-09-25 DIAGNOSIS — Z833 Family history of diabetes mellitus: Secondary | ICD-10-CM | POA: Diagnosis not present

## 2016-09-25 LAB — BASIC METABOLIC PANEL
Anion gap: 10 (ref 5–15)
BUN: 18 mg/dL (ref 6–20)
CO2: 25 mmol/L (ref 22–32)
Calcium: 9.1 mg/dL (ref 8.9–10.3)
Chloride: 105 mmol/L (ref 101–111)
Creatinine, Ser: 1.19 mg/dL — ABNORMAL HIGH (ref 0.44–1.00)
GFR calc Af Amer: 55 mL/min — ABNORMAL LOW (ref >60)
GFR calc non Af Amer: 47 mL/min — ABNORMAL LOW (ref >60)
Glucose, Bld: 90 mg/dL (ref 65–99)
Potassium: 3.2 mmol/L — ABNORMAL LOW (ref 3.5–5.1)
Sodium: 140 mmol/L (ref 135–145)

## 2016-09-25 LAB — CBC
HCT: 37.9 % (ref 36.0–46.0)
Hemoglobin: 12.3 g/dL (ref 12.0–15.0)
MCH: 29.3 pg (ref 26.0–34.0)
MCHC: 32.5 g/dL (ref 30.0–36.0)
MCV: 90.2 fL (ref 78.0–100.0)
Platelets: 355 10*3/uL (ref 150–400)
RBC: 4.2 MIL/uL (ref 3.87–5.11)
RDW: 14 % (ref 11.5–15.5)
WBC: 7.6 10*3/uL (ref 4.0–10.5)

## 2016-09-25 LAB — SURGICAL PCR SCREEN
MRSA, PCR: NEGATIVE
Staphylococcus aureus: NEGATIVE

## 2016-09-25 LAB — GLUCOSE, CAPILLARY: Glucose-Capillary: 132 mg/dL — ABNORMAL HIGH (ref 65–99)

## 2016-09-25 LAB — HM DIABETES EYE EXAM

## 2016-09-25 MED ORDER — CHLORHEXIDINE GLUCONATE 4 % EX LIQD: 60.0000 mL | Freq: Once | CUTANEOUS | Status: DC

## 2016-09-25 NOTE — Telephone Encounter (Signed)
Pt called and asked to talk to someone as she wants advice about her nerves. Pt phone # is [phone]

## 2016-09-25 NOTE — Pre-Procedure Instructions (Signed)
    Desiree [name]  09/25/2016      [id number] - [place], [place] - [address] AT [name] OF [address] [place] [address] Phone: [phone] Fax: [phone]  [id number] - [place], [place] - [address] [place] [address] Phone: [phone] Fax: [phone]  [facility] - [place], [place] - [address] [place] [address] Phone: [phone] Fax: [phone]  [facility] - [place], [place] - [address] [place] [address] Phone: [phone] Fax: [phone]    Your procedure is scheduled on 09/27/16.  Report to [facility] Admitting at 1230 pm  Call this number if you have problems the morning of surgery:  [phone]   Remember:  Do not eat food or drink liquids after midnight.  Take these medicines the morning of surgery with A SIP OF WATER --tylenol,cymbalta,protonix   Do not wear jewelry, make-up or nail polish.  Do not wear lotions, powders, or perfumes, or deoderant.  Do not shave 48 hours prior to surgery.  Men may shave face and neck.  Do not bring valuables to the hospital.  [facility] is not responsible for any belongings or valuables.  Contacts, dentures or bridgework may not be worn into surgery.  Leave your suitcase in the car.  After surgery it may be brought to your room.  For patients admitted to the hospital, discharge time will be determined by your treatment team.  Patients discharged the day of surgery will not be allowed to drive home.   Name and phone number of your driver:    Special instructions:  Do not take any aspirin,anti-inflammatories,vitamins,or herbal supplements 5-7 days prior to surgery.  Please read over the following fact sheets that you were given. MRSA Information

## 2016-09-26 LAB — HEMOGLOBIN A1C
Hgb A1c MFr Bld: 6 % — ABNORMAL HIGH (ref 4.8–5.6)
Mean Plasma Glucose: 126 mg/dL

## 2016-09-27 ENCOUNTER — Inpatient Hospital Stay (HOSPITAL_COMMUNITY): Payer: Commercial Managed Care - HMO

## 2016-09-27 ENCOUNTER — Inpatient Hospital Stay (HOSPITAL_COMMUNITY): Payer: Commercial Managed Care - HMO | Admitting: Anesthesiology

## 2016-09-27 ENCOUNTER — Encounter (HOSPITAL_COMMUNITY): Payer: Self-pay | Admitting: Surgery

## 2016-09-27 ENCOUNTER — Inpatient Hospital Stay (HOSPITAL_COMMUNITY)
Admission: RE | Admit: 2016-09-27 | Discharge: 2016-09-28 | DRG: 473 | Disposition: A | Discharge status: Home / Self Care | Payer: Commercial Managed Care - HMO | Source: Ambulatory Visit | Attending: Orthopaedic Surgery | Admitting: Orthopaedic Surgery

## 2016-09-27 ENCOUNTER — Encounter (HOSPITAL_COMMUNITY)
Admission: RE | Disposition: A | Discharge status: Home / Self Care | Payer: Self-pay | Source: Ambulatory Visit | Attending: Orthopaedic Surgery

## 2016-09-27 DIAGNOSIS — Z880 Allergy status to penicillin: Secondary | ICD-10-CM

## 2016-09-27 DIAGNOSIS — M4322 Fusion of spine, cervical region: Secondary | ICD-10-CM | POA: Diagnosis not present

## 2016-09-27 DIAGNOSIS — M4722 Other spondylosis with radiculopathy, cervical region: Secondary | ICD-10-CM | POA: Diagnosis present

## 2016-09-27 DIAGNOSIS — Z833 Family history of diabetes mellitus: Secondary | ICD-10-CM

## 2016-09-27 DIAGNOSIS — Z419 Encounter for procedure for purposes other than remedying health state, unspecified: Secondary | ICD-10-CM

## 2016-09-27 DIAGNOSIS — Z808 Family history of malignant neoplasm of other organs or systems: Secondary | ICD-10-CM | POA: Diagnosis not present

## 2016-09-27 DIAGNOSIS — E1122 Type 2 diabetes mellitus with diabetic chronic kidney disease: Secondary | ICD-10-CM | POA: Diagnosis present

## 2016-09-27 DIAGNOSIS — Z885 Allergy status to narcotic agent status: Secondary | ICD-10-CM | POA: Diagnosis not present

## 2016-09-27 DIAGNOSIS — M4720 Other spondylosis with radiculopathy, site unspecified: Secondary | ICD-10-CM | POA: Diagnosis not present

## 2016-09-27 DIAGNOSIS — J449 Chronic obstructive pulmonary disease, unspecified: Secondary | ICD-10-CM | POA: Diagnosis present

## 2016-09-27 DIAGNOSIS — M4802 Spinal stenosis, cervical region: Secondary | ICD-10-CM | POA: Diagnosis present

## 2016-09-27 DIAGNOSIS — F319 Bipolar disorder, unspecified: Secondary | ICD-10-CM | POA: Diagnosis present

## 2016-09-27 DIAGNOSIS — Z981 Arthrodesis status: Secondary | ICD-10-CM | POA: Diagnosis not present

## 2016-09-27 DIAGNOSIS — Z7951 Long term (current) use of inhaled steroids: Secondary | ICD-10-CM

## 2016-09-27 DIAGNOSIS — Z881 Allergy status to other antibiotic agents status: Secondary | ICD-10-CM

## 2016-09-27 DIAGNOSIS — M542 Cervicalgia: Secondary | ICD-10-CM | POA: Diagnosis present

## 2016-09-27 DIAGNOSIS — Z87891 Personal history of nicotine dependence: Secondary | ICD-10-CM | POA: Diagnosis not present

## 2016-09-27 DIAGNOSIS — Z8249 Family history of ischemic heart disease and other diseases of the circulatory system: Secondary | ICD-10-CM

## 2016-09-27 DIAGNOSIS — N183 Chronic kidney disease, stage 3 (moderate): Secondary | ICD-10-CM | POA: Diagnosis present

## 2016-09-27 DIAGNOSIS — M47812 Spondylosis without myelopathy or radiculopathy, cervical region: Secondary | ICD-10-CM | POA: Diagnosis present

## 2016-09-27 HISTORY — PX: ANTERIOR CERVICAL DECOMP/DISCECTOMY FUSION: SHX1161

## 2016-09-27 LAB — GLUCOSE, CAPILLARY
Glucose-Capillary: 104 mg/dL — ABNORMAL HIGH (ref 65–99)
Glucose-Capillary: 125 mg/dL — ABNORMAL HIGH (ref 65–99)
Glucose-Capillary: 135 mg/dL — ABNORMAL HIGH (ref 65–99)

## 2016-09-27 SURGERY — ANTERIOR CERVICAL DECOMPRESSION/DISCECTOMY FUSION 1 LEVEL
Anesthesia: General

## 2016-09-27 MED ORDER — ONDANSETRON HCL 4 MG/2ML IJ SOLN
INTRAMUSCULAR | Status: DC | PRN
  Administered 2016-09-27: 4 mg via INTRAVENOUS

## 2016-09-27 MED ORDER — DOCUSATE SODIUM 100 MG PO CAPS
100.0000 mg | ORAL_CAPSULE | Freq: Two times a day (BID) | ORAL | Status: DC
  Administered 2016-09-27 – 2016-09-28 (×2): 100 mg via ORAL
  Filled 2016-09-27 (×2): qty 1

## 2016-09-27 MED ORDER — ONDANSETRON HCL 4 MG/2ML IJ SOLN
INTRAMUSCULAR | Status: AC
  Filled 2016-09-27: qty 2

## 2016-09-27 MED ORDER — PANTOPRAZOLE SODIUM 40 MG PO TBEC
40.0000 mg | DELAYED_RELEASE_TABLET | Freq: Every day | ORAL | Status: DC
Start: 2016-09-28 — End: 2016-09-28
  Administered 2016-09-28: 40 mg via ORAL
  Filled 2016-09-27: qty 1

## 2016-09-27 MED ORDER — OXYCODONE HCL 5 MG/5ML PO SOLN: 5.0000 mg | Freq: Once | ORAL | Status: DC | PRN

## 2016-09-27 MED ORDER — PROPOFOL 10 MG/ML IV BOLUS
INTRAVENOUS | Status: DC | PRN
  Administered 2016-09-27: 100 mg via INTRAVENOUS
  Administered 2016-09-27: 50 mg via INTRAVENOUS

## 2016-09-27 MED ORDER — METHOCARBAMOL 500 MG PO TABS
500.0000 mg | ORAL_TABLET | Freq: Four times a day (QID) | ORAL | Status: DC | PRN
Start: 2016-09-27 — End: 2016-09-28

## 2016-09-27 MED ORDER — SUGAMMADEX SODIUM 200 MG/2ML IV SOLN
INTRAVENOUS | Status: DC | PRN
  Administered 2016-09-27: 200 mg via INTRAVENOUS

## 2016-09-27 MED ORDER — FLUTICASONE PROPIONATE 50 MCG/ACT NA SUSP
1.0000 | Freq: Every day | NASAL | Status: DC
  Administered 2016-09-27: 2 via NASAL
  Filled 2016-09-27: qty 16

## 2016-09-27 MED ORDER — LOSARTAN POTASSIUM 25 MG PO TABS
25.0000 mg | ORAL_TABLET | Freq: Every day | ORAL | Status: DC
  Administered 2016-09-27 – 2016-09-28 (×2): 25 mg via ORAL
  Filled 2016-09-27 (×2): qty 1

## 2016-09-27 MED ORDER — SODIUM CHLORIDE 0.9 % IV SOLN: 250.0000 mL | INTRAVENOUS | Status: DC

## 2016-09-27 MED ORDER — MIDAZOLAM HCL 5 MG/5ML IJ SOLN
INTRAMUSCULAR | Status: DC | PRN
  Administered 2016-09-27: 2 mg via INTRAVENOUS

## 2016-09-27 MED ORDER — VANCOMYCIN HCL IN DEXTROSE 1-5 GM/200ML-% IV SOLN
INTRAVENOUS | Status: AC
  Administered 2016-09-27: 1000 mg via INTRAVENOUS
  Filled 2016-09-27: qty 200

## 2016-09-27 MED ORDER — ROCURONIUM BROMIDE 10 MG/ML (PF) SYRINGE
PREFILLED_SYRINGE | INTRAVENOUS | Status: AC
  Filled 2016-09-27: qty 10

## 2016-09-27 MED ORDER — LORATADINE 10 MG PO TABS
10.0000 mg | ORAL_TABLET | Freq: Every day | ORAL | Status: DC
  Administered 2016-09-27 – 2016-09-28 (×2): 10 mg via ORAL
  Filled 2016-09-27 (×2): qty 1

## 2016-09-27 MED ORDER — VANCOMYCIN HCL IN DEXTROSE 1-5 GM/200ML-% IV SOLN
1000.0000 mg | INTRAVENOUS | Status: AC
  Administered 2016-09-27: 1000 mg via INTRAVENOUS

## 2016-09-27 MED ORDER — SODIUM CHLORIDE 0.9 % IV SOLN
INTRAVENOUS | Status: DC
  Administered 2016-09-28: 05:00:00 via INTRAVENOUS

## 2016-09-27 MED ORDER — SODIUM CHLORIDE 0.9% FLUSH: 3.0000 mL | INTRAVENOUS | Status: DC | PRN

## 2016-09-27 MED ORDER — SUGAMMADEX SODIUM 200 MG/2ML IV SOLN
INTRAVENOUS | Status: AC
  Filled 2016-09-27: qty 2

## 2016-09-27 MED ORDER — BUPIVACAINE-EPINEPHRINE (PF) 0.25% -1:200000 IJ SOLN
INTRAMUSCULAR | Status: AC
  Filled 2016-09-27: qty 30

## 2016-09-27 MED ORDER — PROPOFOL 10 MG/ML IV BOLUS
INTRAVENOUS | Status: AC
  Filled 2016-09-27: qty 20

## 2016-09-27 MED ORDER — ONDANSETRON HCL 4 MG/2ML IJ SOLN: 4.0000 mg | INTRAMUSCULAR | Status: DC | PRN

## 2016-09-27 MED ORDER — PHENOL 1.4 % MT LIQD: 1.0000 | OROMUCOSAL | Status: DC | PRN

## 2016-09-27 MED ORDER — POLYETHYLENE GLYCOL 3350 17 G PO PACK: 17.0000 g | PACK | Freq: Every day | ORAL | Status: DC | PRN

## 2016-09-27 MED ORDER — FENTANYL CITRATE (PF) 100 MCG/2ML IJ SOLN
INTRAMUSCULAR | Status: AC
  Filled 2016-09-27: qty 2

## 2016-09-27 MED ORDER — MENTHOL 3 MG MT LOZG: 1.0000 | LOZENGE | OROMUCOSAL | Status: DC | PRN

## 2016-09-27 MED ORDER — LORAZEPAM 0.5 MG PO TABS: 0.5000 mg | ORAL_TABLET | Freq: Two times a day (BID) | ORAL | Status: DC | PRN

## 2016-09-27 MED ORDER — HYDROMORPHONE HCL 2 MG/ML IJ SOLN
0.5000 mg | INTRAMUSCULAR | Status: DC | PRN
  Administered 2016-09-27 – 2016-09-28 (×3): 0.5 mg via INTRAVENOUS
  Filled 2016-09-27 (×3): qty 1

## 2016-09-27 MED ORDER — OXYCODONE-ACETAMINOPHEN 5-325 MG PO TABS: 1.0000 | ORAL_TABLET | Freq: Four times a day (QID) | ORAL | Status: DC | PRN

## 2016-09-27 MED ORDER — BUPIVACAINE-EPINEPHRINE (PF) 0.25% -1:200000 IJ SOLN
INTRAMUSCULAR | Status: DC | PRN
  Administered 2016-09-27: 6 mL via PERINEURAL

## 2016-09-27 MED ORDER — DULOXETINE HCL 60 MG PO CPEP
60.0000 mg | ORAL_CAPSULE | Freq: Every day | ORAL | Status: DC
  Administered 2016-09-28: 60 mg via ORAL
  Filled 2016-09-27: qty 1

## 2016-09-27 MED ORDER — MIDAZOLAM HCL 2 MG/2ML IJ SOLN
INTRAMUSCULAR | Status: AC
  Filled 2016-09-27: qty 2

## 2016-09-27 MED ORDER — LACTATED RINGERS IV SOLN
INTRAVENOUS | Status: DC
  Administered 2016-09-27: 13:00:00 via INTRAVENOUS

## 2016-09-27 MED ORDER — FENTANYL CITRATE (PF) 100 MCG/2ML IJ SOLN: 25.0000 ug | INTRAMUSCULAR | Status: DC | PRN

## 2016-09-27 MED ORDER — 0.9 % SODIUM CHLORIDE (POUR BTL) OPTIME
TOPICAL | Status: DC | PRN
  Administered 2016-09-27: 1000 mL

## 2016-09-27 MED ORDER — PHENYLEPHRINE HCL 10 MG/ML IJ SOLN
INTRAMUSCULAR | Status: DC | PRN
  Administered 2016-09-27: 80 ug via INTRAVENOUS

## 2016-09-27 MED ORDER — LIDOCAINE 2% (20 MG/ML) 5 ML SYRINGE
INTRAMUSCULAR | Status: AC
  Filled 2016-09-27: qty 5

## 2016-09-27 MED ORDER — CHOLECALCIFEROL 10 MCG (400 UNIT) PO TABS
400.0000 [IU] | ORAL_TABLET | Freq: Every day | ORAL | Status: DC
  Administered 2016-09-27: 400 [IU] via ORAL
  Filled 2016-09-27: qty 1

## 2016-09-27 MED ORDER — METHOCARBAMOL 1000 MG/10ML IJ SOLN
500.0000 mg | Freq: Four times a day (QID) | INTRAVENOUS | Status: DC | PRN
  Filled 2016-09-27: qty 5

## 2016-09-27 MED ORDER — VANCOMYCIN HCL 10 G IV SOLR
1250.0000 mg | INTRAVENOUS | Status: DC
  Administered 2016-09-28: 1250 mg via INTRAVENOUS
  Filled 2016-09-27: qty 1250

## 2016-09-27 MED ORDER — SODIUM CHLORIDE 0.9% FLUSH
3.0000 mL | Freq: Two times a day (BID) | INTRAVENOUS | Status: DC
  Administered 2016-09-27 – 2016-09-28 (×2): 3 mL via INTRAVENOUS

## 2016-09-27 MED ORDER — LAMOTRIGINE 25 MG PO TABS
25.0000 mg | ORAL_TABLET | Freq: Two times a day (BID) | ORAL | Status: DC
  Administered 2016-09-27 – 2016-09-28 (×2): 25 mg via ORAL
  Filled 2016-09-27 (×2): qty 1

## 2016-09-27 MED ORDER — FENTANYL CITRATE (PF) 100 MCG/2ML IJ SOLN
INTRAMUSCULAR | Status: DC | PRN
  Administered 2016-09-27 (×3): 100 ug via INTRAVENOUS

## 2016-09-27 MED ORDER — ATORVASTATIN CALCIUM 20 MG PO TABS
20.0000 mg | ORAL_TABLET | Freq: Every day | ORAL | Status: DC
  Administered 2016-09-27 – 2016-09-28 (×2): 20 mg via ORAL
  Filled 2016-09-27 (×2): qty 1

## 2016-09-27 MED ORDER — LIDOCAINE HCL (CARDIAC) 20 MG/ML IV SOLN
INTRAVENOUS | Status: DC | PRN
  Administered 2016-09-27: 100 mg via INTRAVENOUS

## 2016-09-27 MED ORDER — OXYCODONE HCL 5 MG PO TABS: 5.0000 mg | ORAL_TABLET | Freq: Once | ORAL | Status: DC | PRN

## 2016-09-27 MED ORDER — ROCURONIUM BROMIDE 100 MG/10ML IV SOLN
INTRAVENOUS | Status: DC | PRN
  Administered 2016-09-27: 60 mg via INTRAVENOUS

## 2016-09-27 MED ORDER — PHENYLEPHRINE 40 MCG/ML (10ML) SYRINGE FOR IV PUSH (FOR BLOOD PRESSURE SUPPORT)
PREFILLED_SYRINGE | INTRAVENOUS | Status: AC
  Filled 2016-09-27: qty 10

## 2016-09-27 MED ORDER — LACTATED RINGERS IV SOLN
INTRAVENOUS | Status: DC | PRN
  Administered 2016-09-27 (×2): via INTRAVENOUS

## 2016-09-27 SURGICAL SUPPLY — 57 items
APL SKNCLS STERI-STRIP NONHPOA (GAUZE/BANDAGES/DRESSINGS) ×1
BENZOIN TINCTURE PRP APPL 2/3 (GAUZE/BANDAGES/DRESSINGS) ×2 IMPLANT
BIT DRILL SKYLINE 12MM (BIT) IMPLANT
BLADE SURG ROTATE 9660 (MISCELLANEOUS) IMPLANT
BONE CERV LORDOTIC 14.5X12X6 (Bone Implant) ×2 IMPLANT
BUR ROUND FLUTED 4 SOFT TCH (BURR) IMPLANT
CLOSURE STERI-STRIP 1/4X4 (GAUZE/BANDAGES/DRESSINGS) ×1 IMPLANT
COLLAR CERV LO CONTOUR FIRM DE (SOFTGOODS) ×2 IMPLANT
COLLAR CERV PROCARE ST 2.25 (SOFTGOODS) ×1 IMPLANT
COLLECTOR WOUND DRAINAGE MED (WOUND CARE) ×1 IMPLANT
COVER MAYO STAND STRL (DRAPES) ×4 IMPLANT
COVER SURGICAL LIGHT HANDLE (MISCELLANEOUS) ×2 IMPLANT
CRADLE DONUT ADULT HEAD (MISCELLANEOUS) ×2 IMPLANT
DRAPE C-ARM 42X72 X-RAY (DRAPES) ×2 IMPLANT
DRAPE MICROSCOPE LEICA (MISCELLANEOUS) ×2 IMPLANT
DRAPE PROXIMA HALF (DRAPES) ×2 IMPLANT
DRILL BIT SKYLINE 12MM (BIT) ×2
DURAPREP 6ML APPLICATOR 50/CS (WOUND CARE) ×2 IMPLANT
ELECT COATED BLADE 2.86 ST (ELECTRODE) ×1 IMPLANT
ELECT REM PT RETURN 9FT ADLT (ELECTROSURGICAL) ×2
ELECTRODE REM PT RTRN 9FT ADLT (ELECTROSURGICAL) ×1 IMPLANT
EVACUATOR 1/8 PVC DRAIN (DRAIN) ×1 IMPLANT
GAUZE SPONGE 4X4 12PLY STRL (GAUZE/BANDAGES/DRESSINGS) ×1 IMPLANT
GLOVE BIOGEL PI IND STRL 8 (GLOVE) ×2 IMPLANT
GLOVE BIOGEL PI INDICATOR 8 (GLOVE) ×2
GLOVE ORTHO TXT STRL SZ7.5 (GLOVE) ×4 IMPLANT
GOWN STRL REUS W/ TWL LRG LVL3 (GOWN DISPOSABLE) ×1 IMPLANT
GOWN STRL REUS W/ TWL XL LVL3 (GOWN DISPOSABLE) ×1 IMPLANT
GOWN STRL REUS W/TWL 2XL LVL3 (GOWN DISPOSABLE) ×2 IMPLANT
GOWN STRL REUS W/TWL LRG LVL3 (GOWN DISPOSABLE) ×2
GOWN STRL REUS W/TWL XL LVL3 (GOWN DISPOSABLE) ×2
GRAFT BNE SPCR VG2 14.5X12X6 (Bone Implant) IMPLANT
HEAD HALTER (SOFTGOODS) ×2 IMPLANT
KIT BASIN OR (CUSTOM PROCEDURE TRAY) ×2 IMPLANT
KIT ROOM TURNOVER OR (KITS) ×2 IMPLANT
MANIFOLD NEPTUNE II (INSTRUMENTS) ×1 IMPLANT
NDL 25GX 5/8IN NON SAFETY (NEEDLE) ×1 IMPLANT
NEEDLE 25GX 5/8IN NON SAFETY (NEEDLE) ×2 IMPLANT
NS IRRIG 1000ML POUR BTL (IV SOLUTION) ×2 IMPLANT
PACK ORTHO CERVICAL (CUSTOM PROCEDURE TRAY) ×2 IMPLANT
PAD ARMBOARD 7.5X6 YLW CONV (MISCELLANEOUS) ×4 IMPLANT
PATTIES SURGICAL .5 X.5 (GAUZE/BANDAGES/DRESSINGS) ×1 IMPLANT
PLATE ONE LEVEL SKYLINE 14MM (Plate) ×1 IMPLANT
RESTRAINT LIMB HOLDER UNIV (RESTRAINTS) ×1 IMPLANT
SCREW VARIABLE SELF TAP 12MM (Screw) ×4 IMPLANT
SPONGE GAUZE 4X4 12PLY STER LF (GAUZE/BANDAGES/DRESSINGS) ×1 IMPLANT
STRIP CLOSURE SKIN 1/2X4 (GAUZE/BANDAGES/DRESSINGS) ×1 IMPLANT
SURGIFLO W/THROMBIN 8M KIT (HEMOSTASIS) IMPLANT
SUT BONE WAX W31G (SUTURE) ×1 IMPLANT
SUT VIC AB 3-0 PS2 18 (SUTURE) ×2
SUT VIC AB 3-0 PS2 18XBRD (SUTURE) ×1 IMPLANT
SUT VIC AB 4-0 PS2 27 (SUTURE) ×2 IMPLANT
SYR BULB 3OZ (MISCELLANEOUS) ×1 IMPLANT
TAPE CLOTH SURG 4X10 WHT LF (GAUZE/BANDAGES/DRESSINGS) ×1 IMPLANT
TOWEL OR 17X24 6PK STRL BLUE (TOWEL DISPOSABLE) ×2 IMPLANT
TOWEL OR 17X26 10 PK STRL BLUE (TOWEL DISPOSABLE) ×2 IMPLANT
WATER STERILE IRR 1000ML POUR (IV SOLUTION) ×2 IMPLANT

## 2016-09-27 NOTE — H&P (Signed)
Desiree [name] is an 65 y.o. female.   Chief Complaint:  Neck pain and left upper extremity radiculopathy. HPI: Patient with history of cervical stenosis presents with complaints. Progressively worsening symptoms. Failed conservative treatment.  Past Medical History:  Diagnosis Date  . Anxiety   . Bipolar disorder ([place])   . CANDIDIASIS, ESOPHAGEAL 07/28/2009   Qualifier: Diagnosis of  By: [name] MD, [name]   . Chronic kidney disease    CKD III  . COPD (chronic obstructive pulmonary disease) ([place])   . Depression   . Diabetes mellitus without complication (HCC)    no meds  . FH: colonic polyps   . Fractured elbow    right   . GERD (gastroesophageal reflux disease)   . HH (hiatus hernia)   . History of carpal tunnel syndrome   . History of chest pain   . History of transfusion of packed red blood cells   . Hyperlipidemia   . Hypertension   . Neuroleptic-induced tardive dyskinesia   . Osteoarthritis of more than one site   . Seasonal allergies     Past Surgical History:  Procedure Laterality Date  . Back Fusion  2002  . CARPAL TUNNEL RELEASE  yates   left  . COLONOSCOPY N/A 01/05/2014   Procedure: COLONOSCOPY;  Surgeon: [name], MD;  Location: [facility];  Service: Endoscopy;  Laterality: N/A;  . EXTERNAL EAR SURGERY Left   . EYE SURGERY     "removed white dots under eyelid"  . FINGER SURGERY Left   . Juvara osteomy    . KNEE SURGERY    . Rt. toe bunion    . skin, shave biopsy  05/03/2016   Left occipital scalp, top of scalp    Family History  Problem Relation Age of Onset  . Heart attack Father   . Heart disease Father   . Throat cancer Brother   . Diabetes Mother   . Hypertension Mother   . Heart attack Mother   . Diabetes type II Brother   . Heart disease Brother    Social History:  reports that she quit smoking about 13 years ago. Her smoking use included Cigarettes. She has a 40.00 pack-year smoking history. She has never used smokeless tobacco. She  reports that she does not drink alcohol or use drugs.  Allergies:  Allergies  Allergen Reactions  . Amoxicillin Hives and Rash    Has patient had a PCN reaction causing immediate rash, facial/tongue/throat swelling, SOB or lightheadedness with hypotension:Yes Has patient had a PCN reaction causing severe rash involving mucus membranes or skin necrosis: NO Has patient had a PCN reaction that required hospitalization NO Has patient had a PCN reaction occurring within the last 10 years: NO If all of the above answers are "NO", then may proceed with Cephalosporin use.   . Codeine Hives, Itching and Rash  . Hydrocodone-Acetaminophen Hives, Itching and Rash  . Penicillins Hives, Itching and Rash    ALLERGIC REACTION TO ORAL AMOXICILLIN Has patient had a PCN reaction causing immediate rash, facial/tongue/throat swelling, SOB or lightheadedness with hypotension: {Yes Has patient had a PCN reaction causing severe rash involving mucus membranes or skin necrosis: NO Has patient had a PCN reaction that required hospitalization NO Has patient had a PCN reaction occurring within the last 10 years: NO If all of the above answers are "NO", then may proceed with Cephalosporin use.    Medications Prior to Admission  Medication Sig Dispense Refill  . acetaminophen (TYLENOL)  500 MG tablet Take 2 tablets (1,000 mg total) by mouth every 6 (six) hours as needed. (Patient taking differently: Take 1,000 mg by mouth every 6 (six) hours as needed (for pain/headache.). ) 30 tablet 0  . atorvastatin (LIPITOR) 20 MG tablet Take 1 tablet (20 mg total) by mouth daily. 90 tablet 1  . BIOTIN PO Take 1 tablet by mouth 2 (two) times daily.     . Blood Glucose Monitoring Suppl (ACCU-CHEK NANO SMARTVIEW) W/DEVICE KIT Use to check blood sugar 1-2 times daily (Patient taking differently: 1 strip by Other route 2 (two) times a week. ) 1 kit 0  . cetirizine (ZYRTEC) 10 MG tablet Take 10 mg by mouth at bedtime.     .  Cholecalciferol (VITAMIN D PO) Take 400 Units by mouth at bedtime.     . docusate sodium (COLACE) 100 MG capsule Take 100 mg by mouth at bedtime.     . DULoxetine (CYMBALTA) 60 MG capsule Take 1 capsule (60 mg total) by mouth daily. 30 capsule 0  . fluticasone (FLONASE) 50 MCG/ACT nasal spray Place 1-2 sprays into both nostrils at bedtime.     Marland Kitchen glucose blood (ACCU-CHEK AVIVA PLUS) test strip TEST ONCE OR TWICE DAILY 100 each 1  . hydroquinone 4 % cream APPLIED TO AFFECTED AREA OF SKIN TWICE DAILY  0  . lamoTRIgine (LAMICTAL) 25 MG tablet Take 1 tablet (25 mg total) by mouth 2 (two) times daily. 14 tablet 0  . Lancets Misc. (ACCU-CHEK FASTCLIX LANCET) KIT USE TO CHECK BLOOD SUGAR 1-2 TIMES DAILY 1 kit 0  . LORazepam (ATIVAN) 0.5 MG tablet Take 0.5 mg by mouth 2 (two) times daily.  0  . losartan (COZAAR) 25 MG tablet take 1 tablet by mouth once daily (Patient taking differently: TAKE 1 TABLET (25 MG) BY MOUTH ONCE DAILY IN THE MORNING) 90 tablet 1  . NON FORMULARY Take 1 capsule by mouth at bedtime. FDguard    . ondansetron (ZOFRAN-ODT) 4 MG disintegrating tablet Take 1 tablet (4 mg total) by mouth every 8 (eight) hours as needed for nausea or vomiting. 20 tablet 0  . OVER THE COUNTER MEDICATION Take 1 capsule by mouth daily. IBgard    . pantoprazole (PROTONIX) 40 MG tablet Take 40 mg by mouth daily.    . Probiotic Product (ALIGN PO) Take 1 capsule by mouth at bedtime.     . triamcinolone lotion (KENALOG) 0.1 % Apply 1 application topically as directed. Apply twice daily to affected area on skin/scalp 3-4 X wkly  0  . Multiple Vitamins-Minerals (CENTRUM SILVER PO) Take 1 tablet by mouth daily.     . naproxen (NAPROSYN) 500 MG tablet Take 1 tablet (500 mg total) by mouth 2 (two) times daily. (Patient not taking: Reported on 09/08/2016) 30 tablet 0  . Omega-3 1000 MG CAPS Take 1,000 mg by mouth 2 (two) times daily.      Results for orders placed or performed during the hospital encounter of  09/25/16 (from the past 48 hour(s))  Glucose, capillary     Status: Abnormal   Collection Time: 09/25/16  2:55 PM  Result Value Ref Range   Glucose-Capillary 132 (H) 65 - 99 mg/dL  CBC     Status: None   Collection Time: 09/25/16  3:32 PM  Result Value Ref Range   WBC 7.6 4.0 - 10.5 K/uL   RBC 4.20 3.87 - 5.11 MIL/uL   Hemoglobin 12.3 12.0 - 15.0 g/dL   HCT 37.9 36.0 -  46.0 %   MCV 90.2 78.0 - 100.0 fL   MCH 29.3 26.0 - 34.0 pg   MCHC 32.5 30.0 - 36.0 g/dL   RDW 14.0 11.5 - 15.5 %   Platelets 355 150 - 400 K/uL  Basic metabolic panel     Status: Abnormal   Collection Time: 09/25/16  3:32 PM  Result Value Ref Range   Sodium 140 135 - 145 mmol/L   Potassium 3.2 (L) 3.5 - 5.1 mmol/L   Chloride 105 101 - 111 mmol/L   CO2 25 22 - 32 mmol/L   Glucose, Bld 90 65 - 99 mg/dL   BUN 18 6 - 20 mg/dL   Creatinine, Ser 1.19 (H) 0.44 - 1.00 mg/dL   Calcium 9.1 8.9 - 10.3 mg/dL   GFR calc non Af Amer 47 (L) >60 mL/min   GFR calc Af Amer 55 (L) >60 mL/min    Comment: (NOTE) The eGFR has been calculated using the CKD EPI equation. This calculation has not been validated in all clinical situations. eGFR's persistently <60 mL/min signify possible Chronic Kidney Disease.    Anion gap 10 5 - 15  Hemoglobin A1c     Status: Abnormal   Collection Time: 09/25/16  3:32 PM  Result Value Ref Range   Hgb A1c MFr Bld 6.0 (H) 4.8 - 5.6 %    Comment: (NOTE)         Pre-diabetes: 5.7 - 6.4         Diabetes: >6.4         Glycemic control for adults with diabetes: <7.0    Mean Plasma Glucose 126 mg/dL    Comment: (NOTE) Performed At: [facility] [place], [place] [id number] Lindon Romp MD WU:[id number]   Surgical pcr screen     Status: None   Collection Time: 09/25/16  3:33 PM  Result Value Ref Range   MRSA, PCR NEGATIVE NEGATIVE   Staphylococcus aureus NEGATIVE NEGATIVE    Comment:        The Xpert SA Assay (FDA approved for NASAL specimens in patients over 39  years of age), is one component of a comprehensive surveillance program.  Test performance has been validated by [facility] for patients greater than or equal to 45 year old. It is not intended to diagnose infection nor to guide or monitor treatment.    No results found.  Review of Systems  Constitutional: Negative.   HENT: Negative.   Respiratory: Negative.   Cardiovascular: Negative.   Genitourinary: Negative.   Musculoskeletal: Positive for neck pain.  Skin: Negative.   Psychiatric/Behavioral: Negative.     There were no vitals taken for this visit. Physical Exam  Constitutional: She is oriented to person, place, and time. No distress.  HENT:  Head: Normocephalic.  Respiratory: No respiratory distress.  GI: She exhibits no distension.  Musculoskeletal:  She has bilateral brachial plexus tenderness, positive Spurling right and left.  Upper extremity reflexes are 1+ and symmetrical.  She is alert and oriented.  Negative impingement of the shoulders.  Lungs are clear.  Abdomen is round, nondistended, no tenderness.  Negative log roll of both hips.  Negative straight leg raising.  She has some sciatic notch tenderness.  Trochanteric bursal tenderness right and left.   Neurological: She is alert and oriented to person, place, and time.  Skin: Skin is warm and dry.    Study Result   CLINICAL DATA:  Neck pain radiating to the left arm with  weakness and numbness. Symptoms worsening over the last several months.  EXAM: MRI CERVICAL SPINE WITHOUT CONTRAST  TECHNIQUE: Multiplanar, multisequence MR imaging of the cervical spine was performed. No intravenous contrast was administered.  COMPARISON:  Radiography 06/06/2016.  MRI 03/18/2011.  FINDINGS: Alignment: Normal  Vertebrae: No fracture or focal bone lesion.  Cord: No cord compression or primary cord lesion.  Posterior Fossa, vertebral arteries, paraspinal tissues: Normal  Disc levels:  C1-2:  Arthropathy at the C1-2 joint with some edema. This could be a cause of neck pain. No neural encroachment.  C2-3: Mild bulging of the disc.  No facet arthropathy.  No stenosis.  C3-4: Mild bulging of the disc. Facet degeneration worse on the right. No compressive central canal stenosis. Foraminal narrowing on the right that could affect the C4 nerve root.  C4-5: Mild bulging of the disc. Facet arthropathy left worse than right. No central canal stenosis. Foraminal narrowing on the left that could affect the C5 nerve root.  C5-6: Spondylosis with endplate osteophytes and shallow herniation of disc material more prominent towards the right. Effacement of the ventral subarachnoid space on the right but no cord compression. Mild facet degeneration on the right and severe facet degeneration on the left. Foraminal stenosis bilaterally, on the right primarily on the basis of endplate osteophytes and disc herniation and on the left primarily on the basis of facet arthropathy. Either C6 nerve root could be compressed.  C6-7: Bulging of the disc. Facet arthropathy right worse than left. No compressive central canal narrowing. Mild foraminal narrowing on the right.  C7-T1: Mild bulging of the disc. Facet arthropathy left worse than right. No canal or foraminal stenosis.  IMPRESSION: Arthropathy of the C1-2 articulation with edema. This could be associated with neck pain. No neural compression.  Facet arthropathy could be associated with neck pain. This is more severe on the right at C3-4 and C6-7 and more severe on the left at C4-5, C5-6 and C7-T1. Degenerative spondylosis most pronounced at C5-6, right worse than left.  No cord compression. Foraminal stenosis could cause neural compression on the right at C3-4, on the left at C4-5, and bilaterally at C5-6.     Assessment/Plan C5-6 stenosis/spondylosis.   We'll proceed with C5-6 Anterior Cervical Discectomy and Fusion,  Allograft and Plate as scheduled. Surgical procedure along with possible rehabilitation/recovery time discussed. All questions answered.  09/27/2016, 12:02 PM

## 2016-09-27 NOTE — Transfer of Care (Signed)
Immediate Anesthesia Transfer of Care Note  Patient: Desiree [name]  Procedure(s) Performed: Procedure(s): C5-6 Anterior Cervical Discectomy and Fusion, Allograft and Plate (N/A)  Patient Location: PACU  Anesthesia Type:General  Level of Consciousness: awake  Airway & Oxygen Therapy: Patient Spontanous Breathing and Patient connected to face mask oxygen  Post-op Assessment: Report given to RN and Post -op Vital signs reviewed and stable  Post vital signs: Reviewed and stable  Last Vitals:  Vitals:   09/27/16 1211 09/27/16 1719  BP: (!) 145/82 (!) 160/91  Pulse: 78 (!) 109  Resp: 18 15  Temp: 37.3 C 36.1 C    Last Pain:  Vitals:   09/27/16 1719  TempSrc:   PainSc: Asleep      Patients Stated Pain Goal: 3 (09/27/16 1239)  Complications: No apparent anesthesia complications

## 2016-09-27 NOTE — Brief Op Note (Signed)
09/27/2016  4:40 PM  PATIENT:  [name]  65 y.o. female  PRE-OPERATIVE DIAGNOSIS:  C5-6 Spondylosis, HNP  POST-OPERATIVE DIAGNOSIS:  C5-6 Spondylosis, HNP  PROCEDURE:  Procedure(s): C5-6 Anterior Cervical Discectomy and Fusion, Allograft and Plate (N/A)  SURGEON:  Surgeon(s) and Role:    Eldred Manges, MD - Primary  PHYSICIAN ASSISTANT: Zonia Kief    ANESTHESIA:   general  EBL:  No intake/output data recorded.  BLOOD ADMINISTERED:none  DRAINS: none   LOCAL MEDICATIONS USED:  MARCAINE     SPECIMEN:  No Specimen  DISPOSITION OF SPECIMEN:  N/A  COUNTS:  YES  TOURNIQUET:  * No tourniquets in log *  DICTATION: .Dragon Dictation  PLAN OF CARE: Admit for overnight observation  PATIENT DISPOSITION:  PACU - hemodynamically stable.

## 2016-09-27 NOTE — Anesthesia Procedure Notes (Signed)
Procedure Name: Intubation Date/Time: 09/27/2016 3:26 PM Performed by: Gavin Pound, Marvene Strohm J Pre-anesthesia Checklist: Patient identified, Emergency Drugs available, Suction available, Patient being monitored and Timeout performed Patient Re-evaluated:Patient Re-evaluated prior to inductionOxygen Delivery Method: Circle system utilized Preoxygenation: Pre-oxygenation with 100% oxygen Intubation Type: IV induction Ventilation: Mask ventilation without difficulty Laryngoscope Size: Mac and 3 Grade View: Grade I Tube type: Oral Tube size: 7.0 mm Number of attempts: 1 Placement Confirmation: ETT inserted through vocal cords under direct vision,  positive ETCO2 and breath sounds checked- equal and bilateral Secured at: 21 cm Tube secured with: Tape Dental Injury: Teeth and Oropharynx as per pre-operative assessment

## 2016-09-27 NOTE — Progress Notes (Signed)
Orthopedic Tech Progress Note Patient Details:  Desiree [name] 09/28/1951 [id number]  Ortho Devices Type of Ortho Device: Soft collar Ortho Device/Splint Location: at bedside Ortho Device/Splint Interventions: Casandra Doffing 09/27/2016, 7:48 PM

## 2016-09-27 NOTE — Anesthesia Preprocedure Evaluation (Signed)
Anesthesia Evaluation  Patient identified by MRN, date of birth, ID band Patient awake    Reviewed: Allergy & Precautions, NPO status , Patient's Chart, lab work & pertinent test results  History of Anesthesia Complications Negative for: history of anesthetic complications  Airway Mallampati: II  TM Distance: >3 FB Neck ROM: Full    Dental  (+) Upper Dentures, Missing   Pulmonary neg shortness of breath, neg sleep apnea, COPD, former smoker,    breath sounds clear to auscultation       Cardiovascular hypertension, Pt. on medications (-) angina(-) Past MI and (-) CHF  Rhythm:Regular     Neuro/Psych PSYCHIATRIC DISORDERS Anxiety Depression Bipolar Disorder  Neuromuscular disease    GI/Hepatic hiatal hernia, GERD  Medicated and Controlled,  Endo/Other  diabetes, Type 2  Renal/GU Renal InsufficiencyRenal disease     Musculoskeletal  (+) Arthritis ,   Abdominal   Peds  Hematology   Anesthesia Other Findings   Reproductive/Obstetrics                             Anesthesia Physical Anesthesia Plan  ASA: II  Anesthesia Plan: General   Post-op Pain Management:    Induction: Intravenous  Airway Management Planned: Oral ETT  Additional Equipment: None  Intra-op Plan:   Post-operative Plan: Extubation in OR  Informed Consent: I have reviewed the patients History and Physical, chart, labs and discussed the procedure including the risks, benefits and alternatives for the proposed anesthesia with the patient or authorized representative who has indicated his/her understanding and acceptance.   Dental advisory given  Plan Discussed with: CRNA and Surgeon  Anesthesia Plan Comments:         Anesthesia Quick Evaluation

## 2016-09-27 NOTE — Op Note (Signed)
Preop diagnosis: Cervical spondylosis C5-6  Postop diagnosis: Same  Procedure: C5-6 anterior cervical discectomy and fusion, allograft and plate  Surgeon: Annell Greening M.D.  Assistant: Zonia Kief PA-C medically necessary and present for the entire procedure.  Anesthesia: general oral tracheal anesthesia plus Marcaine skin local.  EBL: Minimal per anesthetic record  Drains: One Hemovac neck  Procedure: After induction general anesthesia oral tracheal intubation had halter traction was applied without weight neck was prepped with DuraPrep arms were tucked at the side with careful padding and positioning leg pumpers were applied. The area squared with towels sterile skin marker on the prominent skin fold Betadine Steri-Drape application and thyroid sheet drapes was sterile Mayo stand at the head. Timeout procedure was completed vancomycin was given prophylactically.  Incision was made starting at the midline extending to the left platysma was divided in line with the fibers. Sloping below the omohyoid blunt dissection down the longus Coley with carotid sheath contents laterally. Large prominent spur at C5-6 corresponding to the x-ray which was posted on the wall was confirmed with a short 25 needle placed in the disc space and C-arm spot picture confirming the appropriate level. Disc was removed spur was removed self-retaining retractors were placed and then upper microscope was draped brought in discectomy was performed progressing back to the posterior longitudinal ligament were there was extruded disc in combination of hard and soft disc causing compression at the midline. Uncovertebral joints were stripped with Clark curettes. 1 and 2 mm Kerrisons were removed to remove spurs laterally and centrally decompressing the dura. Trial sizer showed a 6 mm spacer gave appropriate length endplates were curetted and the bur was used to to the hard sclerotic bone. 6 mm corticocancellous allograft was inserted  VGE. Skyline Depew plate 14 mm was selected and checked under C-arm for positioning AP and lateral there was sterilely draped and then screws were inserted 12 mm 4 final spot pictures were taken to confirm appropriate placement Hemovac was placed with the trocar just lateral to the incision platysma was closed with 3-0 Vicryl for Vicryl subcuticular closure tincture benzoin Steri-Strips and soft cervical collar patient was neurologically intact in the care room with good relief of arm pain.

## 2016-09-27 NOTE — Interval H&P Note (Signed)
History and Physical Interval Note:  09/27/2016 2:37 PM  Desiree [name]  has presented today for surgery, with the diagnosis of C5-6 Spondylosis, HNP  The various methods of treatment have been discussed with the patient and family. After consideration of risks, benefits and other options for treatment, the patient has consented to  Procedure(s): C5-6 Anterior Cervical Discectomy and Fusion, Allograft and Plate (N/A) as a surgical intervention .  The patient's history has been reviewed, patient examined, no change in status, stable for surgery.  I have reviewed the patient's chart and labs.  Questions were answered to the patient's satisfaction.     Eldred Manges

## 2016-09-27 NOTE — Anesthesia Postprocedure Evaluation (Signed)
Anesthesia Post Note  Patient: Desiree [name]  Procedure(s) Performed: Procedure(s) (LRB): C5-6 Anterior Cervical Discectomy and Fusion, Allograft and Plate (N/A)  Patient location during evaluation: PACU Anesthesia Type: General Level of consciousness: awake and alert Pain management: pain level controlled Vital Signs Assessment: post-procedure vital signs reviewed and stable Respiratory status: spontaneous breathing, nonlabored ventilation, respiratory function stable and patient connected to nasal cannula oxygen Cardiovascular status: blood pressure returned to baseline and stable Postop Assessment: no signs of nausea or vomiting Anesthetic complications: no    Last Vitals:  Vitals:   09/27/16 1719 09/27/16 1730  BP: (!) 160/91 (!) 167/91  Pulse: (!) 109 (!) 108  Resp: 15 11  Temp: 36.1 C     Last Pain:  Vitals:   09/27/16 1719  TempSrc:   PainSc: Asleep                 Eaton Folmar

## 2016-09-27 NOTE — Progress Notes (Signed)
Pharmacy Antibiotic Note  Desiree [name] is a 65 y.o. female admitted on 09/27/2016 with Neck pain and left upper extremity radiculopathy.   Pharmacy has been consulted for Vancomycin  Dosing for surgical prophylaxis s/p C5-6 Anterior Cervical Discectomy and Fusion. Received 1g IV vancomycin today in OR 09/27/16 @ 13:57 SCr 1.19, estimated CrCl is ~ 46 ml/min.  WBC wnl. Afebrile  Pharmacy to dose IV vancomycin for 1 dose 12 hours post-op unless patient has a drain, then continue vancomycin until discontinued by physician.  Noted that patient has a closed system drain anterior neck.   RN reports patient has a hemovac drain.    Goal: Vancomycin Trough 10-15 mcg/m  Plan: Vancomycin 1250 mg IV q24h   Monitor clincial progress, renal function, and vancomycin trough at steady state.  F/u when drain removed.    Height: 5\' 3"  (160 cm) Weight: 163 lb 9.3 oz (74.2 kg) IBW/kg (Calculated) : 52.4  Temp (24hrs), Avg:97.9 F (36.6 C), Min:97 F (36.1 C), Max:99.1 F (37.3 C)   Recent Labs Lab 09/25/16 1532  WBC 7.6  CREATININE 1.19*    Estimated Creatinine Clearance: 46.1 mL/min (by C-G formula based on SCr of 1.19 mg/dL (H)).    Allergies  Allergen Reactions  . Amoxicillin Hives and Rash    Has patient had a PCN reaction causing immediate rash, facial/tongue/throat swelling, SOB or lightheadedness with hypotension:Yes Has patient had a PCN reaction causing severe rash involving mucus membranes or skin necrosis: NO Has patient had a PCN reaction that required hospitalization NO Has patient had a PCN reaction occurring within the last 10 years: NO If all of the above answers are "NO", then may proceed with Cephalosporin use.   . Codeine Hives, Itching and Rash  . Hydrocodone-Acetaminophen Hives, Itching and Rash  . Penicillins Hives, Itching and Rash    ALLERGIC REACTION TO ORAL AMOXICILLIN Has patient had a PCN reaction causing immediate rash, facial/tongue/throat swelling, SOB or  lightheadedness with hypotension: {Yes Has patient had a PCN reaction causing severe rash involving mucus membranes or skin necrosis: NO Has patient had a PCN reaction that required hospitalization NO Has patient had a PCN reaction occurring within the last 10 years: NO If all of the above answers are "NO", then may proceed with Cephalosporin use.    Antimicrobials this admission: 12/6 Vancomcyin  >>   Dose adjustments this admission:   Microbiology results: none   Thank you for allowing pharmacy to be a part of this patient's care. 14/6, RPh Clinical Pharmacist Pager: [phone] 09/27/2016 7:11 PM

## 2016-09-27 NOTE — Interval H&P Note (Signed)
History and Physical Interval Note:  09/27/2016 2:23 PM  Desiree [name]  has presented today for surgery, with the diagnosis of C5-6 Spondylosis, HNP  The various methods of treatment have been discussed with the patient and family. After consideration of risks, benefits and other options for treatment, the patient has consented to  Procedure(s): C5-6 Anterior Cervical Discectomy and Fusion, Allograft and Plate (N/A) as a surgical intervention .  The patient's history has been reviewed, patient examined, no change in status, stable for surgery.  I have reviewed the patient's chart and labs.  Questions were answered to the patient's satisfaction.     Eldred Manges

## 2016-09-28 LAB — BASIC METABOLIC PANEL
Anion gap: 10 (ref 5–15)
BUN: 16 mg/dL (ref 6–20)
CO2: 28 mmol/L (ref 22–32)
Calcium: 8.9 mg/dL (ref 8.9–10.3)
Chloride: 102 mmol/L (ref 101–111)
Creatinine, Ser: 1.08 mg/dL — ABNORMAL HIGH (ref 0.44–1.00)
GFR calc Af Amer: >60 mL/min (ref >60)
GFR calc non Af Amer: 53 mL/min — ABNORMAL LOW (ref >60)
Glucose, Bld: 120 mg/dL — ABNORMAL HIGH (ref 65–99)
Potassium: 3.9 mmol/L (ref 3.5–5.1)
Sodium: 140 mmol/L (ref 135–145)

## 2016-09-28 MED ORDER — OXYCODONE-ACETAMINOPHEN 5-325 MG PO TABS: 1.0000 | ORAL_TABLET | Freq: Four times a day (QID) | ORAL | 0 refills | Status: DC | PRN

## 2016-09-28 NOTE — Consult Note (Signed)
            [facility] CM Primary Care Navigator  09/28/2016  Desiree [name] December 27, 1950 [id number]   Went to see patient in the room to identify possible discharge needs but patient had already been discharged today per staff.  Patient was discharged to home with follow-up with orthopedic surgeon in one week per MD note.   For additional questions please contact:  Karin Golden A. Virginia Francisco, BSN, RN-BC [facility] PRIMARY CARE Navigator Cell: [phone]

## 2016-09-28 NOTE — Care Management (Signed)
Patient requested 3in1, CM has ordered .

## 2016-09-28 NOTE — Discharge Instructions (Signed)
Keep collar on , use extra collar with saran wrap to shower. See Dr. Ophelia Charter in about one week.

## 2016-09-28 NOTE — Progress Notes (Signed)
   Subjective: 1 Day Post-Op Procedure(s) (LRB): C5-6 Anterior Cervical Discectomy and Fusion, Allograft and Plate (N/A) Patient reports pain as mild.    Objective: Vital signs in last 24 hours: Temp:  [97 F (36.1 C)-99.1 F (37.3 C)] 99 F (37.2 C) (12/06 2031) Pulse Rate:  [78-109] 102 (12/06 2031) Resp:  [11-18] 16 (12/06 2031) BP: (145-176)/(82-96) 155/84 (12/06 2031) SpO2:  [93 %-98 %] 93 % (12/06 2031) Weight:  [163 lb 9.3 oz (74.2 kg)] 163 lb 9.3 oz (74.2 kg) (12/06 1211)  Intake/Output from previous day: 12/06 0701 - 12/07 0700 In: 1000 [I.V.:1000] Out: 25 [Drains:10; Blood:15] Intake/Output this shift: No intake/output data recorded.   Recent Labs  09/25/16 1532  HGB 12.3    Recent Labs  09/25/16 1532  WBC 7.6  RBC 4.20  HCT 37.9  PLT 355    Recent Labs  09/25/16 1532 09/28/16 0433  NA 140 140  K 3.2* 3.9  CL 105 102  CO2 25 28  BUN 18 16  CREATININE 1.19* 1.08*  GLUCOSE 90 120*  CALCIUM 9.1 8.9   No results for input(s): LABPT, INR in the last 72 hours.  Neurologically intact Dg Cervical Spine 2-3 Views  Result Date: 09/27/2016 CLINICAL DATA:  Elective cervical spine fusion. EXAM: DG C-ARM 61-120 MIN; CERVICAL SPINE - 2-3 VIEW COMPARISON:  06/06/2016 FINDINGS: Anterior cervical fusion hardware is seen at level of C5-6. Alignment appears normal at this level. IMPRESSION: Anterior cervical spine fusion at C5-6 with normal alignment. Electronically Signed   By: Myles Rosenthal M.D.   On: 09/27/2016 18:04   Dg C-arm 1-60 Min  Result Date: 09/27/2016 CLINICAL DATA:  Elective cervical spine fusion. EXAM: DG C-ARM 61-120 MIN; CERVICAL SPINE - 2-3 VIEW COMPARISON:  06/06/2016 FINDINGS: Anterior cervical fusion hardware is seen at level of C5-6. Alignment appears normal at this level. IMPRESSION: Anterior cervical spine fusion at C5-6 with normal alignment. Electronically Signed   By: Myles Rosenthal M.D.   On: 09/27/2016 18:04    Assessment/Plan: 1 Day  Post-Op Procedure(s) (LRB): C5-6 Anterior Cervical Discectomy and Fusion, Allograft and Plate (N/A) Plan discharge home. Office one week.   Desiree [name] 09/28/2016, 7:58 AM

## 2016-09-29 ENCOUNTER — Telehealth (INDEPENDENT_AMBULATORY_CARE_PROVIDER_SITE_OTHER): Payer: Self-pay | Admitting: Orthopaedic Surgery

## 2016-09-29 ENCOUNTER — Encounter (HOSPITAL_COMMUNITY): Payer: Self-pay | Admitting: Orthopaedic Surgery

## 2016-09-29 NOTE — Telephone Encounter (Signed)
Please advise 

## 2016-10-03 ENCOUNTER — Ambulatory Visit (INDEPENDENT_AMBULATORY_CARE_PROVIDER_SITE_OTHER): Payer: Commercial Managed Care - HMO

## 2016-10-03 ENCOUNTER — Ambulatory Visit (INDEPENDENT_AMBULATORY_CARE_PROVIDER_SITE_OTHER): Payer: Commercial Managed Care - HMO | Admitting: Orthopaedic Surgery

## 2016-10-03 DIAGNOSIS — M4802 Spinal stenosis, cervical region: Secondary | ICD-10-CM | POA: Diagnosis not present

## 2016-10-03 NOTE — Progress Notes (Signed)
Office Visit Note   Patient: Desiree [name]           Date of Birth: 1951/07/16           MRN: [id number] Visit Date: 10/03/2016              Requested by: Madelin Headings, MD [address] [place], [place] [address] PCP: Lorretta Harp, MD   Assessment & Plan: Visit Diagnoses:  1. Spinal stenosis of cervical region     Plan:   Follow-Up Instructions: No Follow-up on file.   Orders:  Orders Placed This Encounter  Procedures  . XR Cervical Spine 2 or 3 views   No orders of the defined types were placed in this encounter.     Procedures: No procedures performed   Clinical Data: No additional findings.   Subjective: Chief Complaint  Patient presents with  . Neck - Routine Post Op    Surgery 09/27/16    Ms. Mccravy is 6 days out from C5-6 anterior discectomy and fusion.  States that she is doing good.  I took off dressing and incision looked very good, then I reapplied steri strips to incision.  No issues with dysphagia.    Review of Systems  Constitutional: Negative.   HENT: Negative.   Respiratory: Negative.   Cardiovascular: Negative.   Gastrointestinal: Negative.   Genitourinary: Negative.   Musculoskeletal: Negative.   Skin: Negative.   Neurological: Negative.   Hematological: Negative.   Psychiatric/Behavioral: Negative.      Objective: Vital Signs: There were no vitals taken for this visit.  Physical Exam  Constitutional: She appears well-developed. No distress.  HENT:  Head: Normocephalic and atraumatic.  Neck:  Surgical incision looks good. Steri-Strips applied. No drainage or signs of infection.      Ortho Exam  Specialty Comments:  No specialty comments available.  Imaging: No results found.   PMFS History: Patient Active Problem List   Diagnosis Date Noted  . Cervical spinal stenosis 09/27/2016  . Cervical spondylosis 09/27/2016  . Closed displaced fracture of proximal phalanx of lesser toe of right foot  09/08/2016  . Spinal stenosis of cervical region 09/08/2016  . Polypharmacy 06/23/2015  . Hyperlipidemia 04/19/2015  . Visit for preventive health examination 01/01/2015  . Primary osteoarthritis involving multiple joints 01/01/2015  . Bipolar affective disorder, currently depressed, moderate (HCC)   . Bipolar I disorder with mania (HCC) 08/17/2014  . Foot cramps 07/03/2014  . Hypertension 10/21/2013  . Ear fullness 10/21/2013  . Dizziness 03/25/2013  . Medication withdrawal (HCC) 03/05/2013  . Diabetes mellitus with renal manifestations, controlled (HCC) 11/10/2012  . Alopecia areata 02/06/2012  . Obesity (BMI 30-39.9) 02/06/2012  . Renal insufficiency 07/16/2011  . Bipolar disorder (HCC)   . Neuroleptic-induced tardive dyskinesia   . Exertional dyspnea 02/07/2011  . Dyspnea on exertion 01/19/2011  . DIABETES MELLITUS, TYPE II, CONTROLLED 04/22/2010  . Carpal tunnel syndrome 02/02/2010  . CONSTIPATION 02/02/2010  . DEGENERATIVE JOINT DISEASE 02/02/2010  . TOBACCO USE, QUIT 07/28/2009  . CATARACTS 04/13/2009  . PAIN IN JOINT, ANKLE AND FOOT 09/16/2008  . EOSINOPHILIA 07/21/2008  . AFFECTIVE DISORDER 04/21/2008  . BACK PAIN, CHRONIC 02/12/2008  . LEG PAIN 02/12/2008  . ABNORMAL INVOLUNTARY MOVEMENTS 12/04/2007  . POSTURAL LIGHTHEADEDNESS 11/04/2007  . COLONIC POLYPS, HX OF 11/04/2007  . Essential hypertension 06/17/2007  . HYPERLIPIDEMIA 04/08/2007  . MITRAL VALVE PROLAPSE 04/08/2007  . GERD 04/08/2007  . LOW BACK PAIN SYNDROME 04/08/2007  . CHEST PAIN,  RECURRENT 04/08/2007   Past Medical History:  Diagnosis Date  . Anxiety   . Bipolar disorder (HCC)   . CANDIDIASIS, ESOPHAGEAL 07/28/2009   Qualifier: Diagnosis of  By: Fabian Sharp MD, Neta Mends   . Chronic kidney disease    CKD III  . COPD (chronic obstructive pulmonary disease) (HCC)   . Depression   . Diabetes mellitus without complication (HCC)    no meds  . FH: colonic polyps   . Fractured elbow    right   . GERD  (gastroesophageal reflux disease)   . HH (hiatus hernia)   . History of carpal tunnel syndrome   . History of chest pain   . History of transfusion of packed red blood cells   . Hyperlipidemia   . Hypertension   . Neuroleptic-induced tardive dyskinesia   . Osteoarthritis of more than one site   . Seasonal allergies     Family History  Problem Relation Age of Onset  . Heart attack Father   . Heart disease Father   . Throat cancer Brother   . Diabetes Mother   . Hypertension Mother   . Heart attack Mother   . Diabetes type II Brother   . Heart disease Brother     Past Surgical History:  Procedure Laterality Date  . ANTERIOR CERVICAL DECOMP/DISCECTOMY FUSION  09/27/2016   C5-6 anterior cervical discectomy and fusion, allograft and plate/notes 92/06/2445  . ANTERIOR CERVICAL DECOMP/DISCECTOMY FUSION N/A 09/27/2016   Procedure: C5-6 Anterior Cervical Discectomy and Fusion, Allograft and Plate;  Surgeon: Eldred Manges, MD;  Location: [facility] OR;  Service: Orthopedics;  Laterality: N/A;  . Back Fusion  2002  . CARPAL TUNNEL RELEASE  yates   left  . COLONOSCOPY N/A 01/05/2014   Procedure: COLONOSCOPY;  Surgeon: Charna Elizabeth, MD;  Location: [facility] ENDOSCOPY;  Service: Endoscopy;  Laterality: N/A;  . EXTERNAL EAR SURGERY Left   . EYE SURGERY     "removed white dots under eyelid"  . FINGER SURGERY Left   . Juvara osteomy    . KNEE SURGERY    . Rt. toe bunion    . skin, shave biopsy  05/03/2016   Left occipital scalp, top of scalp   Social History   Occupational History  . Not on file.   Social History Main Topics  . Smoking status: Former Smoker    Packs/day: 2.00    Years: 20.00    Types: Cigarettes    Quit date: 10/23/2002  . Smokeless tobacco: Never Used  . Alcohol use No  . Drug use: No  . Sexual activity: Not on file

## 2016-10-03 NOTE — Patient Instructions (Signed)
Patient does wear cervical collar at all times. AVOID neck flexion and extension lateral bending or rotation.  No driving or lifting.

## 2016-10-04 NOTE — Consult Note (Signed)
            [facility] CM Primary Care Navigator  10/04/2016  Desiree [name] 02-Apr-1951 [id number]    Primary care provider's office called (Sheena)to notify of patient's discharge home and need for post hospital follow-up and transition of care.  Made aware to refer patient to [facility] care management for care coordination needs if deemed appropriate.  For additional questions please contact:  Karin Golden A. Ihsan Nomura, BSN, RN-BC [facility] PRIMARY CARE Navigator Cell: [phone]

## 2016-10-05 NOTE — Telephone Encounter (Signed)
Prior old message, patient has already had surgery and had communication with our office.

## 2016-10-10 ENCOUNTER — Inpatient Hospital Stay (INDEPENDENT_AMBULATORY_CARE_PROVIDER_SITE_OTHER): Payer: Commercial Managed Care - HMO | Admitting: Orthopaedic Surgery

## 2016-10-10 NOTE — Discharge Summary (Signed)
[name] MRN: [id number] DOB/AGE: October 08, 1951 65 y.o.  Admit date: 09/27/2016 Discharge date: 10/10/2016  Admission Diagnoses:  Active Problems:   Cervical spinal stenosis   Cervical spondylosis   Discharge Diagnoses:  Active Problems:   Cervical spinal stenosis   Cervical spondylosis  status post Procedure(s): C5-6 Anterior Cervical Discectomy and Fusion, Allograft and Plate  Past Medical History:  Diagnosis Date  . Anxiety   . Bipolar disorder ([place])   . CANDIDIASIS, ESOPHAGEAL 07/28/2009   Qualifier: Diagnosis of  By: [name] MD, [name]   . Chronic kidney disease    CKD III  . COPD (chronic obstructive pulmonary disease) ([place])   . Depression   . Diabetes mellitus without complication (HCC)    no meds  . FH: colonic polyps   . Fractured elbow    right   . GERD (gastroesophageal reflux disease)   . HH (hiatus hernia)   . History of carpal tunnel syndrome   . History of chest pain   . History of transfusion of packed red blood cells   . Hyperlipidemia   . Hypertension   . Neuroleptic-induced tardive dyskinesia   . Osteoarthritis of more than one site   . Seasonal allergies     Surgeries: Procedure(s): C5-6 Anterior Cervical Discectomy and Fusion, Allograft and Plate on 22/12/3610   Consultants:   Discharged Condition: Improved  Hospital Course: Desiree [name] is an 65 y.o. female who was admitted 09/27/2016 for operative treatment of cervical stenosis. Patient failed conservative treatments (please see the history and physical for the specifics) and had severe unremitting pain that affects sleep, daily activities and work/hobbies. After pre-op clearance, the patient was taken to the operating room on 09/27/2016 and underwent  Procedure(s): C5-6 Anterior Cervical Discectomy and Fusion, Allograft and Plate.    Patient was given perioperative antibiotics:  Anti-infectives    Start     Dose/Rate Route Frequency Ordered Stop   09/28/16 0600   vancomycin (VANCOCIN) IVPB 1000 mg/200 mL premix     1,000 mg 200 mL/hr over 60 Minutes Intravenous On call to O.R. 09/27/16 1209 09/28/16 0801   09/28/16 0600  vancomycin (VANCOCIN) 1,250 mg in sodium chloride 0.9 % 250 mL IVPB  Status:  Discontinued     1,250 mg 166.7 mL/hr over 90 Minutes Intravenous Every 24 hours 09/27/16 1925 09/28/16 1517       Patient was given sequential compression devices and early ambulation to prevent DVT.   Patient benefited maximally from hospital stay and there were no complications. At the time of discharge, the patient was urinating/moving their bowels without difficulty, tolerating a regular diet, pain is controlled with oral pain medications and they have been cleared by PT/OT.   Recent vital signs: No data found.    Recent laboratory studies: No results for input(s): WBC, HGB, HCT, PLT, NA, K, CL, CO2, BUN, CREATININE, GLUCOSE, INR, CALCIUM in the last 72 hours.  Invalid input(s): PT, 2   Discharge Medications:   Allergies as of 09/28/2016      Reactions   Amoxicillin Hives, Rash   Has patient had a PCN reaction causing immediate rash, facial/tongue/throat swelling, SOB or lightheadedness with hypotension:Yes Has patient had a PCN reaction causing severe rash involving mucus membranes or skin necrosis: NO Has patient had a PCN reaction that required hospitalization NO Has patient had a PCN reaction occurring within the last 10 years: NO If all of the above answers are "NO", then may proceed with  Cephalosporin use.   Codeine Hives, Itching, Rash   Hydrocodone-acetaminophen Hives, Itching, Rash   Penicillins Hives, Itching, Rash   ALLERGIC REACTION TO ORAL AMOXICILLIN Has patient had a PCN reaction causing immediate rash, facial/tongue/throat swelling, SOB or lightheadedness with hypotension: {Yes Has patient had a PCN reaction causing severe rash involving mucus membranes or skin necrosis: NO Has patient had a PCN reaction that required  hospitalization NO Has patient had a PCN reaction occurring within the last 10 years: NO If all of the above answers are "NO", then may proceed with Cephalosporin use.      Medication List    TAKE these medications   ACCU-CHEK FASTCLIX LANCET Kit USE TO CHECK BLOOD SUGAR 1-2 TIMES DAILY   ACCU-CHEK NANO SMARTVIEW w/Device Kit Use to check blood sugar 1-2 times daily What changed:  how much to take  how to take this  when to take this  additional instructions   acetaminophen 500 MG tablet Commonly known as:  TYLENOL Take 2 tablets (1,000 mg total) by mouth every 6 (six) hours as needed. What changed:  reasons to take this   ALIGN PO Take 1 capsule by mouth at bedtime.   atorvastatin 20 MG tablet Commonly known as:  LIPITOR Take 1 tablet (20 mg total) by mouth daily.   BIOTIN PO Take 1 tablet by mouth 2 (two) times daily.   CENTRUM SILVER PO Take 1 tablet by mouth daily.   cetirizine 10 MG tablet Commonly known as:  ZYRTEC Take 10 mg by mouth at bedtime.   COLACE 100 MG capsule Generic drug:  docusate sodium Take 100 mg by mouth at bedtime.   DULoxetine 60 MG capsule Commonly known as:  CYMBALTA Take 1 capsule (60 mg total) by mouth daily.   fluticasone 50 MCG/ACT nasal spray Commonly known as:  FLONASE Place 1-2 sprays into both nostrils at bedtime.   glucose blood test strip Commonly known as:  ACCU-CHEK AVIVA PLUS TEST ONCE OR TWICE DAILY   hydroquinone 4 % cream APPLIED TO AFFECTED AREA OF SKIN TWICE DAILY   lamoTRIgine 25 MG tablet Commonly known as:  LAMICTAL Take 1 tablet (25 mg total) by mouth 2 (two) times daily.   LORazepam 0.5 MG tablet Commonly known as:  ATIVAN Take 0.5 mg by mouth 2 (two) times daily.   losartan 25 MG tablet Commonly known as:  COZAAR take 1 tablet by mouth once daily What changed:  See the new instructions.   naproxen 500 MG tablet Commonly known as:  NAPROSYN Take 1 tablet (500 mg total) by mouth 2 (two)  times daily.   NON FORMULARY Take 1 capsule by mouth at bedtime. FDguard   Omega-3 1000 MG Caps Take 1,000 mg by mouth 2 (two) times daily.   ondansetron 4 MG disintegrating tablet Commonly known as:  ZOFRAN-ODT Take 1 tablet (4 mg total) by mouth every 8 (eight) hours as needed for nausea or vomiting.   OVER THE COUNTER MEDICATION Take 1 capsule by mouth daily. IBgard   oxyCODONE-acetaminophen 5-325 MG tablet Commonly known as:  PERCOCET/ROXICET Take 1 tablet by mouth every 6 (six) hours as needed for moderate pain.   pantoprazole 40 MG tablet Commonly known as:  PROTONIX Take 40 mg by mouth daily.   triamcinolone lotion 0.1 % Commonly known as:  KENALOG Apply 1 application topically as directed. Apply twice daily to affected area on skin/scalp 3-4 X wkly   VITAMIN D PO Take 400 Units by mouth at bedtime.  Diagnostic Studies: Dg Cervical Spine 2-3 Views  Result Date: 09/27/2016 CLINICAL DATA:  Elective cervical spine fusion. EXAM: DG C-ARM 61-120 MIN; CERVICAL SPINE - 2-3 VIEW COMPARISON:  06/06/2016 FINDINGS: Anterior cervical fusion hardware is seen at level of C5-6. Alignment appears normal at this level. IMPRESSION: Anterior cervical spine fusion at C5-6 with normal alignment. Electronically Signed   By: Earle Gell M.D.   On: 09/27/2016 18:04   Dg C-arm 1-60 Min  Result Date: 09/27/2016 CLINICAL DATA:  Elective cervical spine fusion. EXAM: DG C-ARM 61-120 MIN; CERVICAL SPINE - 2-3 VIEW COMPARISON:  06/06/2016 FINDINGS: Anterior cervical fusion hardware is seen at level of C5-6. Alignment appears normal at this level. IMPRESSION: Anterior cervical spine fusion at C5-6 with normal alignment. Electronically Signed   By: Earle Gell M.D.   On: 09/27/2016 18:04   Xr Cervical Spine 2 Or 3 Views  Result Date: 10/03/2016 X-ray cervical spine shows the plate and screws are in satisfactory position. Impression stable C5-6 ACDF.  Hardware in satisfactory position.       Follow-up Information    [name], MD Follow up.   Specialty:  Orthopedic Surgery Contact information: [place] [address] [phone]           Discharge Plan:  discharge to home  Disposition:     Signed: Benjiman Core for Dr.Mark Lorin Mercy 10/10/2016, 3:32 PM

## 2016-10-11 NOTE — Telephone Encounter (Signed)
Patient is requesting pain medication and a neck brace she said she isnt sure what she is doing wrong but when she takes a shower it gets wet ? Also having pain in the middle of her chest near her surgical sight.  Cb#: [phone]

## 2016-10-11 NOTE — Telephone Encounter (Signed)
Please advise 

## 2016-10-11 NOTE — Telephone Encounter (Signed)
Up front for patient to pick up

## 2016-10-11 NOTE — Telephone Encounter (Signed)
norco 5/325   1 po q 8 hrs prn pain. Written in my outbox. Also give her extra collar covers saw Fayrene Fearing and she never got some extra collar covers thanks

## 2016-10-31 ENCOUNTER — Telehealth (INDEPENDENT_AMBULATORY_CARE_PROVIDER_SITE_OTHER): Payer: Self-pay | Admitting: Orthopaedic Surgery

## 2016-10-31 ENCOUNTER — Encounter: Payer: Self-pay | Admitting: Internal Medicine

## 2016-10-31 DIAGNOSIS — N189 Chronic kidney disease, unspecified: Secondary | ICD-10-CM | POA: Diagnosis not present

## 2016-10-31 DIAGNOSIS — N2581 Secondary hyperparathyroidism of renal origin: Secondary | ICD-10-CM | POA: Diagnosis not present

## 2016-10-31 NOTE — Telephone Encounter (Signed)
Ok for patient to pick up another cervical collar?

## 2016-10-31 NOTE — Telephone Encounter (Signed)
Ucall. thanks

## 2016-10-31 NOTE — Telephone Encounter (Signed)
Pt asking if there's another neck brace she can buy. She said hers is dingy.   [phone]

## 2016-11-01 NOTE — Telephone Encounter (Signed)
I called patient and advised, she can pick up another collar if she would like. I will leave at front desk for her to pick up.

## 2016-11-01 NOTE — Telephone Encounter (Signed)
Patient returned call. She is going to come and pick up extra collar.

## 2016-11-07 ENCOUNTER — Ambulatory Visit (INDEPENDENT_AMBULATORY_CARE_PROVIDER_SITE_OTHER): Payer: Commercial Managed Care - HMO

## 2016-11-07 ENCOUNTER — Encounter (INDEPENDENT_AMBULATORY_CARE_PROVIDER_SITE_OTHER): Payer: Self-pay | Admitting: Orthopaedic Surgery

## 2016-11-07 ENCOUNTER — Ambulatory Visit (INDEPENDENT_AMBULATORY_CARE_PROVIDER_SITE_OTHER): Payer: Commercial Managed Care - HMO | Admitting: Orthopaedic Surgery

## 2016-11-07 VITALS — BP 151/90 | HR 82 | Ht 63.0 in | Wt 161.0 lb

## 2016-11-07 DIAGNOSIS — M4802 Spinal stenosis, cervical region: Secondary | ICD-10-CM | POA: Diagnosis not present

## 2016-11-07 DIAGNOSIS — R300 Dysuria: Secondary | ICD-10-CM | POA: Diagnosis not present

## 2016-11-07 DIAGNOSIS — N189 Chronic kidney disease, unspecified: Secondary | ICD-10-CM | POA: Diagnosis not present

## 2016-11-07 DIAGNOSIS — D631 Anemia in chronic kidney disease: Secondary | ICD-10-CM | POA: Diagnosis not present

## 2016-11-07 DIAGNOSIS — I129 Hypertensive chronic kidney disease with stage 1 through stage 4 chronic kidney disease, or unspecified chronic kidney disease: Secondary | ICD-10-CM | POA: Diagnosis not present

## 2016-11-07 DIAGNOSIS — N2581 Secondary hyperparathyroidism of renal origin: Secondary | ICD-10-CM | POA: Diagnosis not present

## 2016-11-07 NOTE — Progress Notes (Signed)
Office Visit Note   Patient: Desiree [name]           Date of Birth: February 10, 1951           MRN: [id number] Visit Date: 11/07/2016              Requested by: Madelin Headings, MD [address] [place], [place] [address] PCP: Lorretta Harp, MD   Assessment & Plan: Visit Diagnoses:  1. Stenosis of cervical spine      Post C5-6 ACDF 09/27/2016.  Plan: Patient's fusion is solid good relief her preop pain she is released from care discontinue collar return when necessary.  Follow-Up Instructions: No Follow-up on file.   Orders:  Orders Placed This Encounter  Procedures  . XR Cervical Spine 2 or 3 views   No orders of the defined types were placed in this encounter.     Procedures: No procedures performed   Clinical Data: No additional findings.   Subjective: Chief Complaint  Patient presents with  . Neck - Follow-up    Patient returns status post C5-6 ACDF, Allograft and Plate on 37/03/2830. She is 5 weeks and 6 days post op. She states that she has some left sided neck pain every once in a while, but it is much better than it was. She has continued to wear collar. She takes tylenol as needed for pain. She does have some pain in her lower back.    Review of Systems unchanged from the cervical surgery 09/27/2016 and postop visits.   Objective: Vital Signs: There were no vitals taken for this visit.  Physical Exam incision is well-healed last 3 Steri-Strips were removed. No arm pain good arm strength good relief of preop pain.  Ortho Exam  Specialty Comments:  No specialty comments available.  Imaging: No results found.   PMFS History: Patient Active Problem List   Diagnosis Date Noted  . Cervical spinal stenosis 09/27/2016  . Cervical spondylosis 09/27/2016  . Closed displaced fracture of proximal phalanx of lesser toe of right foot 09/08/2016  . Spinal stenosis of cervical region 09/08/2016  . Polypharmacy 06/23/2015  . Hyperlipidemia  04/19/2015  . Visit for preventive health examination 01/01/2015  . Primary osteoarthritis involving multiple joints 01/01/2015  . Bipolar affective disorder, currently depressed, moderate (HCC)   . Bipolar I disorder with mania (HCC) 08/17/2014  . Foot cramps 07/03/2014  . Hypertension 10/21/2013  . Ear fullness 10/21/2013  . Dizziness 03/25/2013  . Medication withdrawal (HCC) 03/05/2013  . Diabetes mellitus with renal manifestations, controlled (HCC) 11/10/2012  . Alopecia areata 02/06/2012  . Obesity (BMI 30-39.9) 02/06/2012  . Renal insufficiency 07/16/2011  . Bipolar disorder (HCC)   . Neuroleptic-induced tardive dyskinesia   . Exertional dyspnea 02/07/2011  . Dyspnea on exertion 01/19/2011  . DIABETES MELLITUS, TYPE II, CONTROLLED 04/22/2010  . Carpal tunnel syndrome 02/02/2010  . CONSTIPATION 02/02/2010  . DEGENERATIVE JOINT DISEASE 02/02/2010  . TOBACCO USE, QUIT 07/28/2009  . CATARACTS 04/13/2009  . PAIN IN JOINT, ANKLE AND FOOT 09/16/2008  . EOSINOPHILIA 07/21/2008  . AFFECTIVE DISORDER 04/21/2008  . BACK PAIN, CHRONIC 02/12/2008  . LEG PAIN 02/12/2008  . ABNORMAL INVOLUNTARY MOVEMENTS 12/04/2007  . POSTURAL LIGHTHEADEDNESS 11/04/2007  . COLONIC POLYPS, HX OF 11/04/2007  . Essential hypertension 06/17/2007  . HYPERLIPIDEMIA 04/08/2007  . MITRAL VALVE PROLAPSE 04/08/2007  . GERD 04/08/2007  . LOW BACK PAIN SYNDROME 04/08/2007  . CHEST PAIN, RECURRENT 04/08/2007   Past Medical History:  Diagnosis Date  .  Anxiety   . Bipolar disorder (HCC)   . CANDIDIASIS, ESOPHAGEAL 07/28/2009   Qualifier: Diagnosis of  By: Fabian Sharp MD, Neta Mends   . Chronic kidney disease    CKD III  . COPD (chronic obstructive pulmonary disease) (HCC)   . Depression   . Diabetes mellitus without complication (HCC)    no meds  . FH: colonic polyps   . Fractured elbow    right   . GERD (gastroesophageal reflux disease)   . HH (hiatus hernia)   . History of carpal tunnel syndrome   . History  of chest pain   . History of transfusion of packed red blood cells   . Hyperlipidemia   . Hypertension   . Neuroleptic-induced tardive dyskinesia   . Osteoarthritis of more than one site   . Seasonal allergies     Family History  Problem Relation Age of Onset  . Heart attack Father   . Heart disease Father   . Throat cancer Brother   . Diabetes Mother   . Hypertension Mother   . Heart attack Mother   . Diabetes type II Brother   . Heart disease Brother     Past Surgical History:  Procedure Laterality Date  . ANTERIOR CERVICAL DECOMP/DISCECTOMY FUSION  09/27/2016   C5-6 anterior cervical discectomy and fusion, allograft and plate/notes 96/11/9526  . ANTERIOR CERVICAL DECOMP/DISCECTOMY FUSION N/A 09/27/2016   Procedure: C5-6 Anterior Cervical Discectomy and Fusion, Allograft and Plate;  Surgeon: Eldred Manges, MD;  Location: [facility];  Service: Orthopedics;  Laterality: N/A;  . Back Fusion  2002  . CARPAL TUNNEL RELEASE  yates   left  . COLONOSCOPY N/A 01/05/2014   Procedure: COLONOSCOPY;  Surgeon: Charna Elizabeth, MD;  Location: [facility];  Service: Endoscopy;  Laterality: N/A;  . EXTERNAL EAR SURGERY Left   . EYE SURGERY     "removed white dots under eyelid"  . FINGER SURGERY Left   . Juvara osteomy    . KNEE SURGERY    . Rt. toe bunion    . skin, shave biopsy  05/03/2016   Left occipital scalp, top of scalp   Social History   Occupational History  . Not on file.   Social History Main Topics  . Smoking status: Former Smoker    Packs/day: 2.00    Years: 20.00    Types: Cigarettes    Quit date: 10/23/2002  . Smokeless tobacco: Never Used  . Alcohol use No  . Drug use: No  . Sexual activity: Not on file

## 2016-11-10 ENCOUNTER — Telehealth (INDEPENDENT_AMBULATORY_CARE_PROVIDER_SITE_OTHER): Payer: Self-pay | Admitting: Orthopaedic Surgery

## 2016-11-10 NOTE — Telephone Encounter (Signed)
Yes on breathing incentive spirometry. Does not need ABX. ucall

## 2016-11-10 NOTE — Telephone Encounter (Signed)
Please advise 

## 2016-11-10 NOTE — Telephone Encounter (Signed)
Patient called in this afternoon with some questions regarding her recent surgery. She wanted to know if she still needed to use the breathing instrument or not? And also if she needs to be on that medication she had which was an antibiotic??? Her CB # [phone]. Thank you

## 2016-11-11 NOTE — Telephone Encounter (Signed)
I left a voicemail for patient advising.

## 2016-11-29 ENCOUNTER — Telehealth (INDEPENDENT_AMBULATORY_CARE_PROVIDER_SITE_OTHER): Payer: Self-pay | Admitting: Orthopaedic Surgery

## 2016-11-29 NOTE — Telephone Encounter (Signed)
I called patient. She states that she is sleeping all of the time since surgery. She remains tired. She is not taking any medications except what she was taking prior to surgery and states she did not have any problems then. She would also like to know if you could tell her why she is experiencing pain just below her breasts in the middle. She states this is since surgery as well. She cannot tie it in with anything else.   I advised that Dr. Ophelia Charter is in surgery this afternoon and that I would return her call tomorrow.  Please advise.

## 2016-11-29 NOTE — Telephone Encounter (Signed)
Patient called and wanted to know if it is normal to be fatigued 2 months after surgery.  Cb# [phone].  Thank You.

## 2016-11-30 ENCOUNTER — Telehealth (INDEPENDENT_AMBULATORY_CARE_PROVIDER_SITE_OTHER): Payer: Self-pay | Admitting: Orthopaedic Surgery

## 2016-11-30 NOTE — Telephone Encounter (Signed)
I have updated pt phone number in system if you were trying to call her. Thanks

## 2016-11-30 NOTE — Telephone Encounter (Signed)
I attempted to reach patient x 3 this morning. Her phone will not ring, it just clicks. Will try and reach her back tomorrow.

## 2016-11-30 NOTE — Telephone Encounter (Signed)
She can see PCP to get evaluated. They can look at all her meds and check labs.

## 2016-12-01 NOTE — Telephone Encounter (Signed)
I left voicemail for patient advising that she needs to contact her PCP. I called patient on [phone] which Stacie updated in chart for me.

## 2016-12-01 NOTE — Telephone Encounter (Signed)
Noted  

## 2016-12-05 ENCOUNTER — Emergency Department (HOSPITAL_COMMUNITY): Payer: Medicare HMO

## 2016-12-05 ENCOUNTER — Emergency Department (HOSPITAL_COMMUNITY)
Admission: EM | Admit: 2016-12-05 | Discharge: 2016-12-05 | Disposition: A | Discharge status: Home / Self Care | Payer: Medicare HMO | Attending: Emergency Medicine | Admitting: Emergency Medicine

## 2016-12-05 ENCOUNTER — Telehealth: Payer: Self-pay | Admitting: Internal Medicine

## 2016-12-05 ENCOUNTER — Encounter (HOSPITAL_COMMUNITY): Payer: Self-pay | Admitting: Emergency Medicine

## 2016-12-05 DIAGNOSIS — R079 Chest pain, unspecified: Secondary | ICD-10-CM | POA: Diagnosis not present

## 2016-12-05 DIAGNOSIS — I129 Hypertensive chronic kidney disease with stage 1 through stage 4 chronic kidney disease, or unspecified chronic kidney disease: Secondary | ICD-10-CM | POA: Diagnosis not present

## 2016-12-05 DIAGNOSIS — E1122 Type 2 diabetes mellitus with diabetic chronic kidney disease: Secondary | ICD-10-CM | POA: Insufficient documentation

## 2016-12-05 DIAGNOSIS — J449 Chronic obstructive pulmonary disease, unspecified: Secondary | ICD-10-CM | POA: Diagnosis not present

## 2016-12-05 DIAGNOSIS — Z87891 Personal history of nicotine dependence: Secondary | ICD-10-CM | POA: Insufficient documentation

## 2016-12-05 DIAGNOSIS — N183 Chronic kidney disease, stage 3 (moderate): Secondary | ICD-10-CM | POA: Insufficient documentation

## 2016-12-05 DIAGNOSIS — R10816 Epigastric abdominal tenderness: Secondary | ICD-10-CM | POA: Diagnosis not present

## 2016-12-05 LAB — BASIC METABOLIC PANEL
Anion gap: 10 (ref 5–15)
BUN: 12 mg/dL (ref 6–20)
CO2: 26 mmol/L (ref 22–32)
Calcium: 9.7 mg/dL (ref 8.9–10.3)
Chloride: 105 mmol/L (ref 101–111)
Creatinine, Ser: 1.18 mg/dL — ABNORMAL HIGH (ref 0.44–1.00)
GFR calc Af Amer: 55 mL/min — ABNORMAL LOW (ref >60)
GFR calc non Af Amer: 48 mL/min — ABNORMAL LOW (ref >60)
Glucose, Bld: 114 mg/dL — ABNORMAL HIGH (ref 65–99)
Potassium: 3.7 mmol/L (ref 3.5–5.1)
Sodium: 141 mmol/L (ref 135–145)

## 2016-12-05 LAB — I-STAT TROPONIN, ED: Troponin i, poc: 0.01 ng/mL (ref 0.00–0.08)

## 2016-12-05 LAB — LIPASE, BLOOD: Lipase: 26 U/L (ref 11–51)

## 2016-12-05 LAB — CBC
HCT: 38.7 % (ref 36.0–46.0)
Hemoglobin: 12.6 g/dL (ref 12.0–15.0)
MCH: 29.2 pg (ref 26.0–34.0)
MCHC: 32.6 g/dL (ref 30.0–36.0)
MCV: 89.8 fL (ref 78.0–100.0)
Platelets: 318 10*3/uL (ref 150–400)
RBC: 4.31 MIL/uL (ref 3.87–5.11)
RDW: 13.9 % (ref 11.5–15.5)
WBC: 7.2 10*3/uL (ref 4.0–10.5)

## 2016-12-05 LAB — HEPATIC FUNCTION PANEL
ALT: 19 U/L (ref 14–54)
AST: 31 U/L (ref 15–41)
Albumin: 3.9 g/dL (ref 3.5–5.0)
Alkaline Phosphatase: 71 U/L (ref 38–126)
Bilirubin, Direct: <0.1 mg/dL — ABNORMAL LOW (ref 0.1–0.5)
Total Bilirubin: 0.7 mg/dL (ref 0.3–1.2)
Total Protein: 6.8 g/dL (ref 6.5–8.1)

## 2016-12-05 MED ORDER — SUCRALFATE 1 G PO TABS: 1.0000 g | ORAL_TABLET | Freq: Three times a day (TID) | ORAL | 0 refills | Status: DC

## 2016-12-05 MED ORDER — ACETAMINOPHEN 325 MG PO TABS
650.0000 mg | ORAL_TABLET | Freq: Once | ORAL | Status: AC
  Administered 2016-12-05: 650 mg via ORAL
  Filled 2016-12-05: qty 2

## 2016-12-05 NOTE — Telephone Encounter (Signed)
Patient Name: Desiree [name]  DOB: [birth year]    Initial Comment Caller said she has been having chest pains and headaches since surgery in December    Nurse Assessment  Nurse: Annye English, RN, Angelique Blonder Date/Time [name] Time): 12/05/2016 4:40:38 PM  Confirm and document reason for call. If symptomatic, describe symptoms. ---Caller said she has been having chest pains and headaches since cervical spine surgery in December. She has intermittent diff breathing and had an episode earlier.  Does the patient have any new or worsening symptoms? ---Yes  Will a triage be completed? ---Yes  Related visit to physician within the last 2 weeks? ---Yes  Does the PT have any chronic conditions? (i.e. diabetes, asthma, etc.) ---Yes  List chronic conditions. ---Kidney dx, Diabetes.  Is this a behavioral health or substance abuse call? ---No     Guidelines    Guideline Title Affirmed Question Affirmed Notes  Chest Pain [1] Intermittent chest pain or "angina" AND [2] increasing in severity or frequency (Exception: pains lasting a few seconds)    Final Disposition User   Go to ED Now Annye English, RN, Denise    Referrals  [facility] - ED   Disagree/Comply: Comply

## 2016-12-05 NOTE — ED Notes (Signed)
Pt discharge vitals done and pt getting dressed at this time.

## 2016-12-05 NOTE — ED Triage Notes (Signed)
Pt sts mid sternal CP and HA since having cervical sx 2 months ago

## 2016-12-05 NOTE — ED Provider Notes (Signed)
Desiree [name] DEPT Provider Note   CSN: [id number] Arrival date & time: 12/05/16  1733     History   Chief Complaint Chief Complaint  Patient presents with  . Chest Pain    HPI Desiree [name] is a 66 y.o. female.  HPI Pt started having sharp pain in the center of her chest and her left breast.  This started a couple of months ago.   The pain can last for days at a time.  Certain positions do not make it worse.  Specific activities, exertion does not make it worse.   Sometimes she feels short of breath.  Occasionally she has nausea.    No history of CAD.  No history of DVT.  She also is having pain in the back of her head toward the back of her neck.  She called her doctor today to schedule an appointment. and was told to come to the ED. Past Medical History:  Diagnosis Date  . Anxiety   . Bipolar disorder ([place])   . CANDIDIASIS, ESOPHAGEAL 07/28/2009   Qualifier: Diagnosis of  By: [name] MD, [name]   . Chronic kidney disease    CKD III  . COPD (chronic obstructive pulmonary disease) ([place])   . Depression   . Diabetes mellitus without complication (HCC)    no meds  . FH: colonic polyps   . Fractured elbow    right   . GERD (gastroesophageal reflux disease)   . HH (hiatus hernia)   . History of carpal tunnel syndrome   . History of chest pain   . History of transfusion of packed red blood cells   . Hyperlipidemia   . Hypertension   . Neuroleptic-induced tardive dyskinesia   . Osteoarthritis of more than one site   . Seasonal allergies     Patient Active Problem List   Diagnosis Date Noted  . Cervical spinal stenosis 09/27/2016  . Cervical spondylosis 09/27/2016  . Closed displaced fracture of proximal phalanx of lesser toe of right foot 09/08/2016  . Spinal stenosis of cervical region 09/08/2016  . Polypharmacy 06/23/2015  . Hyperlipidemia 04/19/2015  . Visit for preventive health examination 01/01/2015  . Primary osteoarthritis involving multiple joints  01/01/2015  . Bipolar affective disorder, currently depressed, moderate ([place])   . Bipolar I disorder with mania ([place]) 08/17/2014  . Foot cramps 07/03/2014  . Hypertension 10/21/2013  . Ear fullness 10/21/2013  . Dizziness 03/25/2013  . Medication withdrawal ([place]) 03/05/2013  . Diabetes mellitus with renal manifestations, controlled ([place]) 11/10/2012  . Alopecia areata 02/06/2012  . Obesity (BMI 30-39.9) 02/06/2012  . Renal insufficiency 07/16/2011  . Bipolar disorder ([place])   . Neuroleptic-induced tardive dyskinesia   . Exertional dyspnea 02/07/2011  . Dyspnea on exertion 01/19/2011  . DIABETES MELLITUS, TYPE II, CONTROLLED 04/22/2010  . Carpal tunnel syndrome 02/02/2010  . CONSTIPATION 02/02/2010  . DEGENERATIVE JOINT DISEASE 02/02/2010  . TOBACCO USE, QUIT 07/28/2009  . CATARACTS 04/13/2009  . PAIN IN JOINT, ANKLE AND FOOT 09/16/2008  . EOSINOPHILIA 07/21/2008  . AFFECTIVE DISORDER 04/21/2008  . BACK PAIN, CHRONIC 02/12/2008  . LEG PAIN 02/12/2008  . ABNORMAL INVOLUNTARY MOVEMENTS 12/04/2007  . POSTURAL LIGHTHEADEDNESS 11/04/2007  . COLONIC POLYPS, HX OF 11/04/2007  . Essential hypertension 06/17/2007  . HYPERLIPIDEMIA 04/08/2007  . MITRAL VALVE PROLAPSE 04/08/2007  . GERD 04/08/2007  . LOW BACK PAIN SYNDROME 04/08/2007  . CHEST PAIN, RECURRENT 04/08/2007    Past Surgical History:  Procedure Laterality Date  . ANTERIOR CERVICAL  DECOMP/DISCECTOMY FUSION  09/27/2016   C5-6 anterior cervical discectomy and fusion, allograft and plate/notes 09/27/2016  . ANTERIOR CERVICAL DECOMP/DISCECTOMY FUSION N/A 09/27/2016   Procedure: C5-6 Anterior Cervical Discectomy and Fusion, Allograft and Plate;  Surgeon: [name], MD;  Location: [facility];  Service: Orthopedics;  Laterality: N/A;  . Back Fusion  2002  . CARPAL TUNNEL RELEASE  yates   left  . COLONOSCOPY N/A 01/05/2014   Procedure: COLONOSCOPY;  Surgeon: [name], MD;  Location: [facility];  Service: Endoscopy;  Laterality:  N/A;  . EXTERNAL EAR SURGERY Left   . EYE SURGERY     "removed white dots under eyelid"  . FINGER SURGERY Left   . Juvara osteomy    . KNEE SURGERY    . Rt. toe bunion    . skin, shave biopsy  05/03/2016   Left occipital scalp, top of scalp    OB History    No data available       Home Medications    Prior to Admission medications   Medication Sig Start Date End Date Taking? Authorizing Provider  acetaminophen (TYLENOL) 500 MG tablet Take 2 tablets (1,000 mg total) by mouth every 6 (six) hours as needed. Patient taking differently: Take 1,000 mg by mouth every 6 (six) hours as needed (for pain/headache.).  09/13/15  Yes [name], MD  atorvastatin (LIPITOR) 20 MG tablet Take 1 tablet (20 mg total) by mouth daily. 05/18/16  Yes [name], MD  BIOTIN PO Take 1 tablet by mouth 2 (two) times daily.    Yes Historical Provider, MD  Blood Glucose Monitoring Suppl (ACCU-CHEK NANO SMARTVIEW) W/DEVICE KIT Use to check blood sugar 1-2 times daily Patient taking differently: 1 strip by Other route 2 (two) times a week.  11/26/14  Yes [name], MD  cetirizine (ZYRTEC) 10 MG tablet Take 10 mg by mouth at bedtime.    Yes Historical Provider, MD  Cholecalciferol (VITAMIN D PO) Take 400 Units by mouth at bedtime.    Yes Historical Provider, MD  docusate sodium (COLACE) 100 MG capsule Take 100 mg by mouth at bedtime.    Yes Historical Provider, MD  DULoxetine (CYMBALTA) 60 MG capsule Take 1 capsule (60 mg total) by mouth daily. 09/01/14  Yes Mojeed Akintayo, MD  fluticasone (FLONASE) 50 MCG/ACT nasal spray Place 1-2 sprays into both nostrils at bedtime.    Yes Historical Provider, MD  glucose blood (ACCU-CHEK AVIVA PLUS) test strip TEST ONCE OR TWICE DAILY 08/18/16  Yes [name], MD  hydroquinone 4 % cream APPLIED TO AFFECTED AREA OF SKIN TWICE DAILY 11/06/15  Yes Historical Provider, MD  lamoTRIgine (LAMICTAL) 25 MG tablet Take 1 tablet (25 mg total) by mouth 2 (two) times daily.  10/06/14  Yes [name], PA-C  Lancets Misc. (ACCU-CHEK FASTCLIX LANCET) KIT USE TO CHECK BLOOD SUGAR 1-2 TIMES DAILY 12/28/15  Yes [name], MD  LORazepam (ATIVAN) 0.5 MG tablet Take 0.5 mg by mouth 2 (two) times daily as needed for anxiety.  09/08/16  Yes Historical Provider, MD  losartan (COZAAR) 50 MG tablet Take 50 mg by mouth daily. 11/07/16  Yes Historical Provider, MD  Multiple Vitamins-Minerals (CENTRUM SILVER PO) Take 1 tablet by mouth daily.    Yes Historical Provider, MD  NON FORMULARY Take 1 capsule by mouth at bedtime. FDguard   Yes Historical Provider, MD  OVER THE COUNTER MEDICATION Take 1 capsule by mouth every morning. IBgard    Yes Historical  Provider, MD  pantoprazole (PROTONIX) 40 MG tablet Take 40 mg by mouth daily.   Yes Historical Provider, MD  Probiotic Product (ALIGN PO) Take 1 capsule by mouth at bedtime.    Yes Historical Provider, MD  triamcinolone lotion (KENALOG) 0.1 % Apply 1 application topically 2 (two) times daily. To affected area on skin/scalp 3 to 4 times a week 12/27/15  Yes Historical Provider, MD  losartan (COZAAR) 25 MG tablet take 1 tablet by mouth once daily Patient not taking: Reported on 12/05/2016 06/29/16   [name], MD  naproxen (NAPROSYN) 500 MG tablet Take 1 tablet (500 mg total) by mouth 2 (two) times daily. Patient not taking: Reported on 12/05/2016 04/26/16   Domenic Moras, PA-C  ondansetron (ZOFRAN-ODT) 4 MG disintegrating tablet Take 1 tablet (4 mg total) by mouth every 8 (eight) hours as needed for nausea or vomiting. Patient not taking: Reported on 12/05/2016 06/29/16   [name], MD  oxyCODONE-acetaminophen (PERCOCET/ROXICET) 5-325 MG tablet Take 1 tablet by mouth every 6 (six) hours as needed for moderate pain. Patient not taking: Reported on 12/05/2016 09/28/16   [name], MD  sucralfate (CARAFATE) 1 g tablet Take 1 tablet (1 g total) by mouth 4 (four) times daily -  with meals and at bedtime. 12/05/16   Dorie Rank, MD    Family  History Family History  Problem Relation Age of Onset  . Heart attack Father   . Heart disease Father   . Throat cancer Brother   . Diabetes Mother   . Hypertension Mother   . Heart attack Mother   . Diabetes type II Brother   . Heart disease Brother     Social History Social History  Substance Use Topics  . Smoking status: Former Smoker    Packs/day: 2.00    Years: 20.00    Types: Cigarettes    Quit date: 10/23/2002  . Smokeless tobacco: Never Used  . Alcohol use No     Allergies   Amoxicillin; Codeine; Hydrocodone-acetaminophen; and Penicillins   Review of Systems Review of Systems  All other systems reviewed and are negative.    Physical Exam Updated Vital Signs BP 139/85   Pulse 79   Temp 98.7 F (37.1 C) (Oral)   Resp 18   SpO2 100%   Physical Exam  Constitutional: She appears well-developed and well-nourished. No distress.  HENT:  Head: Normocephalic and atraumatic.  Right Ear: External ear normal.  Left Ear: External ear normal.  Eyes: Conjunctivae are normal. Right eye exhibits no discharge. Left eye exhibits no discharge. No scleral icterus.  Neck: Neck supple. No tracheal deviation present.  Cardiovascular: Normal rate, regular rhythm and intact distal pulses.   Pulmonary/Chest: Effort normal and breath sounds normal. No stridor. No respiratory distress. She has no wheezes. She has no rales.  Abdominal: Soft. Bowel sounds are normal. She exhibits no distension. There is tenderness in the epigastric area. There is no rebound and no guarding.  Musculoskeletal: She exhibits no edema or tenderness.  Neurological: She is alert. She has normal strength. No cranial nerve deficit (no facial droop, extraocular movements intact, no slurred speech) or sensory deficit. She exhibits normal muscle tone. She displays no seizure activity. Coordination normal.  Skin: Skin is warm and dry. No rash noted.  Psychiatric: She has a normal mood and affect.  Nursing note  and vitals reviewed.    ED Treatments / Results  Labs (all labs ordered are listed, but only abnormal results  are displayed) Labs Reviewed  BASIC METABOLIC PANEL - Abnormal; Notable for the following:       Result Value   Glucose, Bld 114 (*)    Creatinine, Ser 1.18 (*)    GFR calc non Af Amer 48 (*)    GFR calc Af Amer 55 (*)    All other components within normal limits  HEPATIC FUNCTION PANEL - Abnormal; Notable for the following:    Bilirubin, Direct <0.1 (*)    All other components within normal limits  CBC  LIPASE, BLOOD  I-STAT TROPOININ, ED    EKG  EKG Interpretation  Date/Time:  Tuesday December 05 2016 17:46:32 EST Ventricular Rate:  97 PR Interval:  130 QRS Duration: 78 QT Interval:  406 QTC Calculation: 515 R Axis:   -32 Text Interpretation:  Normal sinus rhythm Left axis deviation Possible Anteroseptal infarct , age undetermined Prolonged QT Abnormal ECG Since last tracing rate faster Confirmed by Dajuan Turnley  MD-J, Cheril Slattery ([id number]) on 12/05/2016 9:27:57 PM       Radiology Dg Chest 2 View  Result Date: 12/05/2016 CLINICAL DATA:  Chronic mid sternal chest pain and headache. Initial encounter. EXAM: CHEST  2 VIEW COMPARISON:  Chest radiograph performed 06/30/2016 FINDINGS: The lungs are well-aerated and clear. There is no evidence of focal opacification, pleural effusion or pneumothorax. The heart is normal in size; the mediastinal contour is within normal limits. No acute osseous abnormalities are seen. Cervical spinal fusion hardware is partially imaged. IMPRESSION: No acute cardiopulmonary process seen. Electronically Signed   By: Garald Balding M.D.   On: 12/05/2016 18:14    Procedures Procedures (including critical care time)  Medications Ordered in ED Medications  acetaminophen (TYLENOL) tablet 650 mg (not administered)     Initial Impression / Assessment and Plan / ED Course  I have reviewed the triage vital signs and the nursing notes.  Pertinent labs &  imaging results that were available during my care of the patient were reviewed by me and considered in my medical decision making (see chart for details).   Pt presented to the ED with months of chest pain.   Sx atypical for ACS.  EKG and troponin are reassuring.   Mild ttp in the epigastric region.  LFTS and lipase are normal.  Will dc home with carafate rx.   Follow up with PCP   Final Clinical Impressions(s) / ED Diagnoses   Final diagnoses:  Chest pain, unspecified type    New Prescriptions New Prescriptions   SUCRALFATE (CARAFATE) 1 G TABLET    Take 1 tablet (1 g total) by mouth 4 (four) times daily -  with meals and at bedtime.     Dorie Rank, MD 12/05/16 [phone]

## 2016-12-05 NOTE — Discharge Instructions (Signed)
Take the carafate in addition to the protonix.  Follow up with your primary care doctor as we discussed

## 2016-12-05 NOTE — ED Notes (Signed)
Spoke with main lab for add on hepatic function panel and lipase.

## 2016-12-06 NOTE — Telephone Encounter (Signed)
Pt was evaluated and treated at [facility] ED.

## 2016-12-06 NOTE — Telephone Encounter (Signed)
Medication should be okay with your other medicines. Have her make a follow-up appointment 30 minutes for about 10-14 days after the ED visit.

## 2016-12-06 NOTE — Telephone Encounter (Signed)
[facility] and [facility] state all is well. Pt states the ED prescribed her sucralfate (CARAFATE) 1 g tablet  Pt wants to know if this is safe for her to take with all the other meds she is on.  Pt also wants to know if you need to see her, since she is ok now,

## 2016-12-08 NOTE — Telephone Encounter (Signed)
Please document that you advised ok to take all medications and made fu appt.  Thanks!!

## 2016-12-08 NOTE — Telephone Encounter (Signed)
Left a message for a return call.

## 2016-12-12 ENCOUNTER — Telehealth: Payer: Self-pay | Admitting: Internal Medicine

## 2016-12-12 NOTE — Telephone Encounter (Signed)
Pt returned a phone call from [place].  Misty advised me to notify pt to continue taking medications and to make a follow-up appt.  Appt made with patient.

## 2016-12-18 NOTE — Telephone Encounter (Signed)
Pt states she is out of the med from the ED sucralfate (CARAFATE) 1 g tablet  Wants to know if she needs stay on this med?

## 2016-12-19 NOTE — Telephone Encounter (Signed)
Stay on the med until I see her in the office  Please send in med enough for 2 weeks worth ( refill until she gets in next week   )

## 2016-12-20 MED ORDER — SUCRALFATE 1 G PO TABS: 1.0000 g | ORAL_TABLET | Freq: Three times a day (TID) | ORAL | 0 refills | Status: DC

## 2016-12-20 NOTE — Telephone Encounter (Signed)
Medication sent to the pharmacy. Pt notified to pick up and stay on med until seen on 12/25/16.
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
## 2016-12-25 ENCOUNTER — Ambulatory Visit (INDEPENDENT_AMBULATORY_CARE_PROVIDER_SITE_OTHER): Payer: Medicare HMO | Admitting: Internal Medicine

## 2016-12-25 ENCOUNTER — Encounter: Payer: Self-pay | Admitting: Internal Medicine

## 2016-12-25 VITALS — BP 120/70 | HR 96 | Temp 98.4°F | Ht 63.0 in | Wt 168.0 lb

## 2016-12-25 DIAGNOSIS — E1122 Type 2 diabetes mellitus with diabetic chronic kidney disease: Secondary | ICD-10-CM | POA: Diagnosis not present

## 2016-12-25 DIAGNOSIS — R079 Chest pain, unspecified: Secondary | ICD-10-CM

## 2016-12-25 DIAGNOSIS — K229 Disease of esophagus, unspecified: Secondary | ICD-10-CM | POA: Diagnosis not present

## 2016-12-25 DIAGNOSIS — R5383 Other fatigue: Secondary | ICD-10-CM

## 2016-12-25 MED ORDER — SUCRALFATE 1 G PO TABS: 1.0000 g | ORAL_TABLET | Freq: Three times a day (TID) | ORAL | 0 refills | Status: DC

## 2016-12-25 NOTE — Patient Instructions (Signed)
Stay on the Carafate for another month. I will send my note to Dr. Loreta Ave. These: Make an appointment for about a month from now with Dr. Loreta Ave. Your fatigue certainly could be from the surgery rehabilitation and recovery. Attention to eating healthy activities tolerated. If after you see Dr. Loreta Ave you're having continuing fatigue that is a love your baseline or other concerns make a follow-up appointment with me.   Lab Results  Component Value Date   WBC 7.2 12/05/2016   HGB 12.6 12/05/2016   HCT 38.7 12/05/2016   PLT 318 12/05/2016   GLUCOSE 114 (H) 12/05/2016   CHOL 149 12/31/2015   TRIG 75.0 12/31/2015   HDL 48.10 12/31/2015   LDLDIRECT 146.3 01/26/2010   LDLCALC 85 12/31/2015   ALT 19 12/05/2016   AST 31 12/05/2016   NA 141 12/05/2016   K 3.7 12/05/2016   CL 105 12/05/2016   CREATININE 1.18 (H) 12/05/2016   BUN 12 12/05/2016   CO2 26 12/05/2016   TSH 1.72 12/31/2015   INR 0.99 07/26/2016   HGBA1C 6.0 (H) 09/25/2016   MICROALBUR 3.1 (H) 12/31/2015

## 2016-12-27 ENCOUNTER — Other Ambulatory Visit: Payer: Self-pay | Admitting: Internal Medicine

## 2016-12-28 ENCOUNTER — Other Ambulatory Visit: Payer: Self-pay | Admitting: Gastroenterology

## 2016-12-28 DIAGNOSIS — R1084 Generalized abdominal pain: Secondary | ICD-10-CM

## 2016-12-28 DIAGNOSIS — K219 Gastro-esophageal reflux disease without esophagitis: Secondary | ICD-10-CM | POA: Diagnosis not present

## 2016-12-28 DIAGNOSIS — R140 Abdominal distension (gaseous): Secondary | ICD-10-CM | POA: Diagnosis not present

## 2016-12-29 ENCOUNTER — Other Ambulatory Visit: Payer: Medicare HMO

## 2017-01-03 ENCOUNTER — Ambulatory Visit
Admission: RE | Admit: 2017-01-03 | Discharge: 2017-01-03 | Disposition: A | Discharge status: Home / Self Care | Payer: Medicare HMO | Source: Ambulatory Visit | Attending: Gastroenterology | Admitting: Gastroenterology

## 2017-01-03 DIAGNOSIS — R1084 Generalized abdominal pain: Secondary | ICD-10-CM

## 2017-01-03 DIAGNOSIS — R109 Unspecified abdominal pain: Secondary | ICD-10-CM | POA: Diagnosis not present

## 2017-01-03 MED ORDER — IOPAMIDOL (ISOVUE-300) INJECTION 61%
100.0000 mL | Freq: Once | INTRAVENOUS | Status: AC | PRN
  Administered 2017-01-03: 100 mL via INTRAVENOUS

## 2017-01-15 ENCOUNTER — Ambulatory Visit (INDEPENDENT_AMBULATORY_CARE_PROVIDER_SITE_OTHER): Payer: Medicare HMO | Admitting: Orthopaedic Surgery

## 2017-01-16 DIAGNOSIS — R140 Abdominal distension (gaseous): Secondary | ICD-10-CM | POA: Diagnosis not present

## 2017-01-16 DIAGNOSIS — K219 Gastro-esophageal reflux disease without esophagitis: Secondary | ICD-10-CM | POA: Diagnosis not present

## 2017-01-16 DIAGNOSIS — K5900 Constipation, unspecified: Secondary | ICD-10-CM | POA: Diagnosis not present

## 2017-01-17 ENCOUNTER — Encounter: Payer: Self-pay | Admitting: Family Medicine

## 2017-01-18 DIAGNOSIS — F314 Bipolar disorder, current episode depressed, severe, without psychotic features: Secondary | ICD-10-CM | POA: Diagnosis not present

## 2017-02-02 ENCOUNTER — Ambulatory Visit (INDEPENDENT_AMBULATORY_CARE_PROVIDER_SITE_OTHER): Payer: Medicare HMO | Admitting: Orthopaedic Surgery

## 2017-02-05 ENCOUNTER — Ambulatory Visit (INDEPENDENT_AMBULATORY_CARE_PROVIDER_SITE_OTHER): Payer: Medicare HMO | Admitting: Orthopaedic Surgery

## 2017-02-15 DIAGNOSIS — F314 Bipolar disorder, current episode depressed, severe, without psychotic features: Secondary | ICD-10-CM | POA: Diagnosis not present

## 2017-02-23 DIAGNOSIS — J449 Chronic obstructive pulmonary disease, unspecified: Secondary | ICD-10-CM

## 2017-02-27 DIAGNOSIS — H40021 Open angle with borderline findings, high risk, right eye: Secondary | ICD-10-CM | POA: Diagnosis not present

## 2017-02-27 DIAGNOSIS — H25813 Combined forms of age-related cataract, bilateral: Secondary | ICD-10-CM | POA: Diagnosis not present

## 2017-03-08 ENCOUNTER — Telehealth: Payer: Self-pay | Admitting: Internal Medicine

## 2017-03-08 ENCOUNTER — Other Ambulatory Visit: Payer: Self-pay | Admitting: Internal Medicine

## 2017-03-08 NOTE — Telephone Encounter (Signed)
° ° °  Pt call to ask it there is anything she can take that will help her with energy. Woud like a call back   [phone]

## 2017-03-09 NOTE — Telephone Encounter (Signed)
I am not aware of any medicine for energy  Would have talk with psych to see if med  Could be contributing   Can make visit if wants to discuss  But I think we saw her for this last year    Lab Results  Component Value Date   WBC 7.2 12/05/2016   HGB 12.6 12/05/2016   HCT 38.7 12/05/2016   PLT 318 12/05/2016   GLUCOSE 114 (H) 12/05/2016   CHOL 149 12/31/2015   TRIG 75.0 12/31/2015   HDL 48.10 12/31/2015   LDLDIRECT 146.3 01/26/2010   LDLCALC 85 12/31/2015   ALT 19 12/05/2016   AST 31 12/05/2016   NA 141 12/05/2016   K 3.7 12/05/2016   CL 105 12/05/2016   CREATININE 1.18 (H) 12/05/2016   BUN 12 12/05/2016   CO2 26 12/05/2016   TSH 1.72 12/31/2015   INR 0.99 07/26/2016   HGBA1C 6.0 (H) 09/25/2016   MICROALBUR 3.1 (H) 12/31/2015

## 2017-03-09 NOTE — Telephone Encounter (Signed)
Please advise 

## 2017-03-12 NOTE — Telephone Encounter (Signed)
Motrin doesn't come in 25 mg   Please clarify Cymbalta can help depression  And help energy but can also cause se .   I advise stop supplements  Unless  From eye doctors or specialists  advice  .  And have her ask psych if depression or meds could be causeing her problem

## 2017-03-12 NOTE — Telephone Encounter (Signed)
Pt states that her psychiatrist increased her motrin to 25 mg daily. He also increased her Cymbalta to 90mg  daily but she has been doing the 60 mg instead. Pt states she is also taking Ginko Bilioba and colace 4 times daily. Asked pt if she wants to come in to further discuss but she wants to wait to see what Dr. thinks .

## 2017-03-13 NOTE — Telephone Encounter (Signed)
Gave pt your recommendation on stopping the supplements but I reviewed pt medication list about the (motrin) she is taking Lamotrigine not motrin.

## 2017-03-14 ENCOUNTER — Telehealth: Payer: Self-pay | Admitting: Family Medicine

## 2017-03-14 NOTE — Telephone Encounter (Signed)
Pt dropped off an application & renewal of disabiltiy parking placard form on 03-07-17.  The phone number she left on the form is no longer in service.  I called and left a message on the phone # we have on file.  Advised the pt to pick up form at the front desk.  Call back if any questions.

## 2017-03-17 ENCOUNTER — Other Ambulatory Visit: Payer: Self-pay | Admitting: Internal Medicine

## 2017-03-28 ENCOUNTER — Other Ambulatory Visit: Payer: Self-pay | Admitting: Emergency Medicine

## 2017-03-28 ENCOUNTER — Telehealth: Payer: Self-pay | Admitting: Internal Medicine

## 2017-03-28 MED ORDER — GLUCOSE BLOOD VI STRP
ORAL_STRIP | 12 refills | Status: DC
Start: 2017-03-28 — End: 2018-09-06

## 2017-03-28 NOTE — Telephone Encounter (Signed)
° °  Pt request refill of the following:  Pt need strips for her ACCU CHECK NANO   Phamacy:  [facility]

## 2017-03-28 NOTE — Telephone Encounter (Signed)
Rx sent 

## 2017-04-24 ENCOUNTER — Telehealth: Payer: Self-pay | Admitting: Internal Medicine

## 2017-04-24 NOTE — Telephone Encounter (Signed)
Patient has been scheduled

## 2017-04-24 NOTE — Telephone Encounter (Signed)
Pt states she is going to bed and waking up with bruising. Different place. Wynelle Link seems to be more sensitive that usual Pt has had some pain in her breast. Wanted Dr Fabian Sharp to know and advise if needs to be seen

## 2017-05-01 ENCOUNTER — Ambulatory Visit: Payer: Medicare HMO | Admitting: Internal Medicine

## 2017-05-08 ENCOUNTER — Ambulatory Visit: Payer: Medicare HMO | Admitting: Internal Medicine

## 2017-05-10 ENCOUNTER — Telehealth: Payer: Self-pay | Admitting: Internal Medicine

## 2017-05-10 NOTE — Telephone Encounter (Signed)
° ° °  Pt would like a call back concerning her BP med. She said she is not sure what med she should be taking the 25 or 50 of the below med     losartan (COZAAR) 25 MG tablet

## 2017-05-11 NOTE — Telephone Encounter (Signed)
Attempted to call patient with the number listed in her chart but the number is disconnected

## 2017-05-11 NOTE — Telephone Encounter (Signed)
Patient states that Dr. Allena Katz told her patient to take 50 mg of Losartan not 25 mg. Patient is confuse and says that she updated Dr. Fabian Sharp on the dosage change at her last visit. Patient needs to know what dosage is she suppose to be taking.

## 2017-05-12 ENCOUNTER — Other Ambulatory Visit: Payer: Self-pay | Admitting: Internal Medicine

## 2017-05-15 ENCOUNTER — Other Ambulatory Visit: Payer: Self-pay | Admitting: Emergency Medicine

## 2017-05-15 ENCOUNTER — Telehealth: Payer: Self-pay | Admitting: Internal Medicine

## 2017-05-15 MED ORDER — LOSARTAN POTASSIUM 50 MG PO TABS: 50.0000 mg | ORAL_TABLET | Freq: Every day | ORAL | 1 refills | Status: DC

## 2017-05-15 NOTE — Telephone Encounter (Signed)
Pt is calling to see if she is to be taking 50 MG due to Dr. Allena Katz had prescribed it and Dr. Fabian Sharp has prescribed 25 MG pt state that the 50 MG worked well for her.  Pt state that she has been out of the medication for over a week now and would like to see if she could get this sent in today.  Pharm:  [facility] [employer]

## 2017-05-15 NOTE — Telephone Encounter (Signed)
Spoke with patient about medication and states that she is taking 50 mg tablet instead of the 25 mg tablet by mouth. Also states that her blood pressure a couple of days ago was 140/90. Per Dr. Fabian Sharp she would like patient to remain on the 50 mg tablet and discontinue the 25 mg . Refill has been sent to patient pharmacy

## 2017-05-15 NOTE — Telephone Encounter (Addendum)
Is very confusing I agree. 2 providers   Involved with theis medication  PCP and nephrologist  I reviewed her chart. It appears from the note from the kidney specialist, that they told her to increase her losartan to 50 mg a day this was in January 2018  Since the med list was questionably changed or not the refills were given as 25 mg  I would do what   the nephrologist said control blood pressure.  May want to ask the nephrologist to rx the med to avoid confusion in the future   What  Dose has she been taking  in the last month and what are her bp readings?

## 2017-05-15 NOTE — Telephone Encounter (Signed)
Spoke with patient about medication and states that she is taking 50 mg tablet instead of the 25 mg tablet by mouth. Also states that her blood pressure a couple of days ago was 140/90. Per Dr. [name] she would like patient to remain on the 50 mg tablet and discontinue the 25 mg . Refill has been sent to patient pharmacy  

## 2017-05-22 DIAGNOSIS — F314 Bipolar disorder, current episode depressed, severe, without psychotic features: Secondary | ICD-10-CM | POA: Diagnosis not present
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
## 2017-06-19 ENCOUNTER — Ambulatory Visit (INDEPENDENT_AMBULATORY_CARE_PROVIDER_SITE_OTHER): Payer: Medicare HMO | Admitting: Internal Medicine

## 2017-06-19 ENCOUNTER — Encounter: Payer: Self-pay | Admitting: Internal Medicine

## 2017-06-19 VITALS — BP 128/62 | HR 124 | Temp 98.1°F | Wt 152.4 lb

## 2017-06-19 DIAGNOSIS — E1122 Type 2 diabetes mellitus with diabetic chronic kidney disease: Secondary | ICD-10-CM

## 2017-06-19 DIAGNOSIS — R634 Abnormal weight loss: Secondary | ICD-10-CM

## 2017-06-19 DIAGNOSIS — R011 Cardiac murmur, unspecified: Secondary | ICD-10-CM

## 2017-06-19 DIAGNOSIS — L819 Disorder of pigmentation, unspecified: Secondary | ICD-10-CM | POA: Diagnosis not present

## 2017-06-19 LAB — POCT GLYCOSYLATED HEMOGLOBIN (HGB A1C): Hemoglobin A1C: 5.8

## 2017-06-19 NOTE — Patient Instructions (Addendum)
  I think the  Rash  area will fade and is not a serious issues  You ears are normal . Exam  is good but your heart murmur is more prominent .   Plan updated echocardiogram    And then plan follow up.   Your  blood sugars  a1c  is good. .  Make sure you get  Your  Kidney check up this year   Lab Results  Component Value Date   WBC 7.2 12/05/2016   HGB 12.6 12/05/2016   HCT 38.7 12/05/2016   PLT 318 12/05/2016   GLUCOSE 114 (H) 12/05/2016   CHOL 149 12/31/2015   TRIG 75.0 12/31/2015   HDL 48.10 12/31/2015   LDLDIRECT 146.3 01/26/2010   LDLCALC 85 12/31/2015   ALT 19 12/05/2016   AST 31 12/05/2016   NA 141 12/05/2016   K 3.7 12/05/2016   CL 105 12/05/2016   CREATININE 1.18 (H) 12/05/2016   BUN 12 12/05/2016   CO2 26 12/05/2016   TSH 1.72 12/31/2015   INR 0.99 07/26/2016   HGBA1C 5.8 06/19/2017   MICROALBUR 3.1 (H) 12/31/2015

## 2017-06-22 ENCOUNTER — Other Ambulatory Visit: Payer: Self-pay | Admitting: Internal Medicine

## 2017-06-27 ENCOUNTER — Other Ambulatory Visit: Payer: Self-pay

## 2017-06-27 ENCOUNTER — Ambulatory Visit (HOSPITAL_COMMUNITY): Payer: Medicare HMO | Attending: Cardiology

## 2017-06-27 DIAGNOSIS — E119 Type 2 diabetes mellitus without complications: Secondary | ICD-10-CM | POA: Insufficient documentation

## 2017-06-27 DIAGNOSIS — Z87891 Personal history of nicotine dependence: Secondary | ICD-10-CM | POA: Insufficient documentation

## 2017-06-27 DIAGNOSIS — R011 Cardiac murmur, unspecified: Secondary | ICD-10-CM | POA: Diagnosis not present

## 2017-06-27 DIAGNOSIS — I119 Hypertensive heart disease without heart failure: Secondary | ICD-10-CM | POA: Insufficient documentation

## 2017-06-27 DIAGNOSIS — R634 Abnormal weight loss: Secondary | ICD-10-CM | POA: Insufficient documentation

## 2017-06-27 DIAGNOSIS — E785 Hyperlipidemia, unspecified: Secondary | ICD-10-CM | POA: Diagnosis not present

## 2017-06-27 DIAGNOSIS — E669 Obesity, unspecified: Secondary | ICD-10-CM | POA: Diagnosis not present

## 2017-06-27 DIAGNOSIS — Z6827 Body mass index (BMI) 27.0-27.9, adult: Secondary | ICD-10-CM | POA: Insufficient documentation

## 2017-06-28 ENCOUNTER — Telehealth: Payer: Self-pay

## 2017-06-28 ENCOUNTER — Other Ambulatory Visit: Payer: Self-pay

## 2017-06-28 DIAGNOSIS — N644 Mastodynia: Secondary | ICD-10-CM | POA: Diagnosis not present

## 2017-06-28 DIAGNOSIS — R931 Abnormal findings on diagnostic imaging of heart and coronary circulation: Secondary | ICD-10-CM

## 2017-06-28 NOTE — Telephone Encounter (Signed)
Call to Desiree [name] to reschedule her 4pm wellness apt on Tuesday due to conflict. LVM to call my direct line or the practice

## 2017-06-29 ENCOUNTER — Other Ambulatory Visit: Payer: Self-pay | Admitting: Obstetrics and Gynecology

## 2017-06-29 DIAGNOSIS — N644 Mastodynia: Secondary | ICD-10-CM

## 2017-07-03 ENCOUNTER — Telehealth: Payer: Self-pay | Admitting: Internal Medicine

## 2017-07-03 ENCOUNTER — Ambulatory Visit: Payer: Medicare HMO

## 2017-07-03 NOTE — Telephone Encounter (Signed)
Pt would like a copy of the Echo she last had

## 2017-07-04 ENCOUNTER — Ambulatory Visit: Payer: Medicare HMO

## 2017-07-04 ENCOUNTER — Ambulatory Visit
Admission: RE | Admit: 2017-07-04 | Discharge: 2017-07-04 | Disposition: A | Discharge status: Home / Self Care | Payer: Medicare HMO | Source: Ambulatory Visit | Attending: Obstetrics and Gynecology | Admitting: Obstetrics and Gynecology

## 2017-07-04 DIAGNOSIS — R922 Inconclusive mammogram: Secondary | ICD-10-CM | POA: Diagnosis not present

## 2017-07-04 DIAGNOSIS — N644 Mastodynia: Secondary | ICD-10-CM

## 2017-07-04 NOTE — Telephone Encounter (Signed)
PT is returning Desiree [name] call

## 2017-07-04 NOTE — Telephone Encounter (Signed)
Left a VM for patient to give the office a call back regarding results. Does she want to pick up results or have them mailed to the house?

## 2017-07-05 ENCOUNTER — Ambulatory Visit (INDEPENDENT_AMBULATORY_CARE_PROVIDER_SITE_OTHER): Payer: Medicare HMO

## 2017-07-05 VITALS — BP 104/60 | HR 91 | Ht 63.0 in | Wt 157.0 lb

## 2017-07-05 DIAGNOSIS — Z0000 Encounter for general adult medical examination without abnormal findings: Secondary | ICD-10-CM

## 2017-07-05 DIAGNOSIS — Z23 Encounter for immunization: Secondary | ICD-10-CM | POA: Diagnosis not present

## 2017-07-05 DIAGNOSIS — Z87891 Personal history of nicotine dependence: Secondary | ICD-10-CM | POA: Diagnosis not present

## 2017-07-05 NOTE — Progress Notes (Signed)
Subjective:   Desiree [name] is a 66 y.o. female who presents for Medicare Annual (Subsequent) preventive examination. The Patient was informed that the wellness visit is to identify future health risk and educate and initiate measures that can reduce risk for increased disease through the lifespan.    Annual Wellness Assessment    Preventive Screening -Counseling & Management  Medicare Annual Preventive Care Visit - Subsequent Last OV in august   [place] as poor, fair, good or great?  Dr. Peggye Ley sent her for a heart test She was sob;  Soreness on left side  Just called her and left a message; wants her to [facility]  C/o of sob at times  Cardiology has called while she was in the waiting room and is calling them back now  Scheduled apt at 10/10 at 10;45  VS reviewed;   Diet  Breakfast cheerios; or  Other cereal Lunch skips Supper - going out for chinese  Very irregular schedule  Goal set to start eating something for lunch  BMI 28   Exercise/ does not exercise Goes shopping     Cardiac Risk Factors Addressed Hyperlipidemia medically managed  Pre-diabetes - A1c is 5.8   Obesity neg  Advanced Directives completed   Patient Care Team: [name], [name], MD as PCP - General (Internal Medicine) [name], MD (Gastroenterology) [name], MD (Orthopedic Surgery) Cottle, Billey Co., MD as Attending Physician (Psychiatry) Comer Locket, PA-C as Physician Assistant (Physician Assistant) Harriet Masson, DPM as Consulting Physician (Podiatry) [name], MD as Consulting Physician (Gastroenterology) Elmarie Shiley, MD as Consulting Physician (Nephrology) Druscilla Brownie, MD as Consulting Physician (Dermatology) Assessed for additional providers  Immunization History  Administered Date(s) Administered  . H1N1 11/13/2008  . Influenza Whole 07/21/2008, 07/28/2009, 06/28/2010, 07/05/2011, 09/22/2012  . Influenza, High Dose Seasonal  PF 07/05/2017  . Influenza,inj,Quad PF,6+ Mos 07/11/2013, 07/03/2014, 06/23/2015, 06/29/2016  . Pneumococcal Conjugate-13 12/19/2013  . Pneumococcal Polysaccharide-23 02/25/2009, 04/19/2015  . Td 02/02/2010  . Tdap 04/25/2016   Required Immunizations needed today  Screening test up to date or reviewed for plan of completion There are no preventive care reminders to display for this patient.  PAP - [facility]-  Sent her to the breast center yesterday for acute pain in left breast  No signs of breast cancer ; states sometimes women get thickness Take tylenol and Vit e per doctor;    Foot exam exam completed- advised her to go back to her foot doctor; goes to the foot center    Bone density - had one at [facility] and it was OK Deferred future bone density to GYN; the patient voiced understanding  Defer pap and mammogram to bone GYN         Objective:     Vitals: BP 104/60   Pulse 91   Ht 5' 3"  (1.6 m)   Wt 157 lb (71.2 kg)   SpO2 97%   BMI 27.81 kg/m   Body mass index is 27.81 kg/m.   Tobacco History  Smoking Status  . Former Smoker  . Packs/day: 2.00  . Years: 20.00  . Types: Cigarettes  . Quit date: 10/23/2001  Smokeless Tobacco  . Never Used    Comment: referred to pulmonary for consult LDCT      Counseling given: Yes   Past Medical History:  Diagnosis Date  . Anxiety   . Bipolar disorder ([place])   . CANDIDIASIS, ESOPHAGEAL  07/28/2009   Qualifier: Diagnosis of  By: [name] MD, [name]   . Chronic kidney disease    CKD III  . COPD (chronic obstructive pulmonary disease) ([place])   . Depression   . Diabetes mellitus without complication (HCC)    no meds  . FH: colonic polyps   . Fractured elbow    right   . GERD (gastroesophageal reflux disease)   . HH (hiatus hernia)   . History of carpal tunnel syndrome   . History of chest pain   . History of transfusion of packed red blood cells   . Hyperlipidemia   . Hypertension    . Neuroleptic-induced tardive dyskinesia   . Osteoarthritis of more than one site   . Seasonal allergies    Past Surgical History:  Procedure Laterality Date  . ANTERIOR CERVICAL DECOMP/DISCECTOMY FUSION  09/27/2016   C5-6 anterior cervical discectomy and fusion, allograft and plate/notes 09/27/2016  . ANTERIOR CERVICAL DECOMP/DISCECTOMY FUSION N/A 09/27/2016   Procedure: C5-6 Anterior Cervical Discectomy and Fusion, Allograft and Plate;  Surgeon: [name], MD;  Location: [facility];  Service: Orthopedics;  Laterality: N/A;  . Back Fusion  2002  . CARPAL TUNNEL RELEASE  yates   left  . COLONOSCOPY N/A 01/05/2014   Procedure: COLONOSCOPY;  Surgeon: [name], MD;  Location: [facility];  Service: Endoscopy;  Laterality: N/A;  . EXTERNAL EAR SURGERY Left   . EYE SURGERY     "removed white dots under eyelid"  . FINGER SURGERY Left   . Juvara osteomy    . KNEE SURGERY    . Rt. toe bunion    . skin, shave biopsy  05/03/2016   Left occipital scalp, top of scalp   Family History  Problem Relation Age of Onset  . Heart attack Father   . Heart disease Father   . Throat cancer Brother   . Diabetes Mother   . Hypertension Mother   . Heart attack Mother   . Diabetes type II Brother   . Heart disease Brother   . Breast cancer Cousin    History  Sexual Activity  . Sexual activity: Not on file    Outpatient Encounter Prescriptions as of 07/05/2017  Medication Sig  . acetaminophen (TYLENOL) 500 MG tablet Take 2 tablets (1,000 mg total) by mouth every 6 (six) hours as needed. (Patient taking differently: Take 1,000 mg by mouth every 6 (six) hours as needed (for pain/headache.). )  . atorvastatin (LIPITOR) 20 MG tablet take 1 tablet by mouth once daily  . Blood Glucose Monitoring Suppl (ACCU-CHEK NANO SMARTVIEW) W/DEVICE KIT Use to check blood sugar 1-2 times daily (Patient taking differently: 1 strip by Other route 2 (two) times a week. )  . cetirizine (ZYRTEC) 10 MG tablet Take 10 mg  by mouth at bedtime.   . docusate sodium (COLACE) 100 MG capsule Take 100 mg by mouth at bedtime.   . DULoxetine (CYMBALTA) 60 MG capsule Take 1 capsule (60 mg total) by mouth daily.  . fluticasone (FLONASE) 50 MCG/ACT nasal spray Place 1-2 sprays into both nostrils at bedtime.   Marland Kitchen glucose blood test strip Accu-chek nano, test glucose once daily, dx E11.9  . hydroquinone 4 % cream APPLIED TO AFFECTED AREA OF SKIN TWICE DAILY  . lamoTRIgine (LAMICTAL) 25 MG tablet Take 1 tablet (25 mg total) by mouth 2 (two) times daily.  . Lancets Misc. (ACCU-CHEK FASTCLIX LANCET) KIT USE TO CHECK BLOOD SUGAR 1-2 TIMES DAILY  . losartan (  COZAAR) 25 MG tablet take 1 tablet by mouth once daily  . Multiple Vitamins-Minerals (CENTRUM SILVER PO) Take 1 tablet by mouth daily.   . NON FORMULARY Take 1 capsule by mouth at bedtime. FDguard  . ondansetron (ZOFRAN-ODT) 4 MG disintegrating tablet Take 1 tablet (4 mg total) by mouth every 8 (eight) hours as needed for nausea or vomiting.  Marland Kitchen OVER THE COUNTER MEDICATION Take 1 capsule by mouth every morning. IBgard   . pantoprazole (PROTONIX) 40 MG tablet Take 40 mg by mouth daily.  . Probiotic Product (ALIGN PO) Take 1 capsule by mouth at bedtime.   . triamcinolone lotion (KENALOG) 0.1 % Apply 1 application topically 2 (two) times daily. To affected area on skin/scalp 3 to 4 times a week  . Cholecalciferol (VITAMIN D PO) Take 400 Units by mouth at bedtime.   Marland Kitchen LORazepam (ATIVAN) 0.5 MG tablet Take 0.5 mg by mouth 2 (two) times daily as needed for anxiety.   . sucralfate (CARAFATE) 1 g tablet Take 1 tablet (1 g total) by mouth 4 (four) times daily -  with meals and at bedtime. (Patient not taking: Reported on 07/05/2017)   No facility-administered encounter medications on file as of 07/05/2017.     Activities of Daily Living In your present state of health, do you have any difficulty performing the following activities: 07/05/2017 09/25/2016  Hearing? N N  Vision? N N    Difficulty concentrating or making decisions? - Y  Walking or climbing stairs? - Y  Dressing or bathing? - N  Doing errands, shopping? - -  Some recent data might be hidden    Patient Care Team: [name], [name], MD as PCP - General (Internal Medicine) [name], MD (Gastroenterology) [name], MD (Orthopedic Surgery) Cottle, Billey Co., MD as Attending Physician (Psychiatry) Comer Locket, PA-C as Physician Assistant (Physician Assistant) Harriet Masson, DPM as Consulting Physician (Podiatry) [name], MD as Consulting Physician (Gastroenterology) Elmarie Shiley, MD as Consulting Physician (Nephrology) Druscilla Brownie, MD as Consulting Physician (Dermatology)    Assessment:     Exercise Activities and Dietary recommendations  fairly sedentary except for housekeeping and shopping  Goals    . patient          Start eating a small lunch; yogurt, cheese, peanut cracker or toast with cheese Protein bar       Fall Risk Fall Risk  07/05/2017 01/12/2016 01/01/2015 11/06/2012  Falls in the past year? No No No No  Number falls in past yr: - 1 - -  Injury with Fall? - No No -  Risk for fall due to : - Other (Comment) History of fall(s) Orthopedic patient;Medication side effect   Depression Screen PHQ 2/9 Scores 07/05/2017 01/12/2016 01/01/2015 11/06/2012  PHQ - 2 Score 0 - 0 0  Exception Documentation - Other- indicate reason in comment box - -  Not completed - sees  psychiatry for meds and depression - -    Is seeing Psychiatry ongoing States dtr has stage 4 lung cancer This is her only child No grand children  Has a friend;  Goes to church   Cognitive Function     6CIT Screen 07/05/2017  What Year? 0 points  What month? 0 points  What time? 0 points  Count back from 20 0 points  Months in reverse 0 points  Repeat phrase 0 points  Total Score 0    Immunization History  Administered Date(s) Administered  . H1N1 11/13/2008  . Influenza  Whole 07/21/2008,  07/28/2009, 06/28/2010, 07/05/2011, 09/22/2012  . Influenza, High Dose Seasonal PF 07/05/2017  . Influenza,inj,Quad PF,6+ Mos 07/11/2013, 07/03/2014, 06/23/2015, 06/29/2016  . Pneumococcal Conjugate-13 12/19/2013  . Pneumococcal Polysaccharide-23 02/25/2009, 04/19/2015  . Td 02/02/2010  . Tdap 04/25/2016   Screening Tests Health Maintenance  Topic Date Due  . PAP SMEAR  06/23/2018 (Originally 04/11/1972)  . DEXA SCAN  06/23/2018 (Originally 04/11/2017)  . OPHTHALMOLOGY EXAM  09/25/2017  . HEMOGLOBIN A1C  12/20/2017  . FOOT EXAM  07/05/2018  . MAMMOGRAM  07/05/2019  . PNA vac Low Risk Adult (2 of 2 - PPSV23) 04/18/2020  . COLONOSCOPY  01/06/2024  . TETANUS/TDAP  04/25/2026  . INFLUENZA VACCINE  Completed  . Hepatitis C Screening  Completed  . HIV Screening  Completed      Plan:     PCP Notes   Health Maintenance Follow-up with GYN ongoing. Deferred mammogram, DEXA and Pap test to GYN. Agreed to take flu vaccine today. Educated on the high-dose since this is her first year taking this. Educated regarding the shingrix.  Abnormal Screens  None noted    Referrals  Scheduled apt with cardiology for 08/01/17  Advised the patient to see a podiatrist regarding the calluses as noted on her foot exam.  Patient concerns; As noted   Nurse Concerns; As mnoted  Next PCP apt  Was seen recently; referral in process to cardiology     I have personally reviewed and noted the following in the patient's chart:   . Medical and social history . Use of alcohol, tobacco or illicit drugs  . Current medications and supplements . Functional ability and status . Nutritional status . Physical activity . Advanced directives . List of other physicians . Hospitalizations, surgeries, and ER visits in previous 12 months . Vitals . Screenings to include cognitive, depression, and falls . Referrals and appointments  In addition, I have reviewed and discussed with patient certain  preventive protocols, quality metrics, and best practice recommendations. A written personalized care plan for preventive services as well as general preventive health recommendations were provided to patient.     Wynetta Fines, RN  07/05/2017

## 2017-07-05 NOTE — Patient Instructions (Addendum)
Desiree [name] , Thank you for taking time to come for your Medicare Wellness Visit. I appreciate your ongoing commitment to your health goals. Please review the following plan we discussed and let me know if I can assist you in the future.   Because you have a 30 pack year smoking hx, we can refer you to pulmonary to a Nurse practitioner who will review the guidelines and pros and cons of having a Low dose CT to check for lung cancer.    The cardiology referral is on 10/10   Continue to see your OB GYN;  Dr. Rosario Adie will continue to follow your mammograms, bone density and your pelvic or pap test as appropriate   Will take you high does flu vaccine  Explained the high does this year  Shingrix is a vaccine for the prevention of Shingles in Adults 50 and older.  If you are on Medicare, you can request a prescription from your doctor to be filled at a pharmacy.  Please check with your benefits regarding applicable copays or out of pocket expenses.  The Shingrix is given in 2 vaccines approx 8 weeks apart. You must receive the 2nd dose prior to 6 months from receipt of the first.    Please make an apt to go to the [facility] when you can    These are the goals we discussed: Goals    . patient          Start eating a small lunch; yogurt, cheese, peanut cracker or toast with cheese Protein bar        This is a list of the screening recommended for you and due dates:  Health Maintenance  Topic Date Due  . Complete foot exam   01/11/2017  . Flu Shot  05/23/2017  . Pap Smear  06/23/2018*  . DEXA scan (bone density measurement)  06/23/2018*  . Eye exam for diabetics  09/25/2017  . Hemoglobin A1C  12/20/2017  . Mammogram  07/05/2019  . Pneumonia vaccines (2 of 2 - PPSV23) 04/18/2020  . Colon Cancer Screening  01/06/2024  . Tetanus Vaccine  04/25/2026  .  Hepatitis C: One time screening is recommended by Center for Disease Control  (CDC) for  adults born from [birth year] through 1964.    Completed  . HIV Screening  Completed  *Topic was postponed. The date shown is not the original due date.          Diabetes and Foot Care Diabetes may cause you to have problems because of poor blood supply (circulation) to your feet and legs. This may cause the skin on your feet to become thinner, break easier, and heal more slowly. Your skin may become dry, and the skin may peel and crack. You may also have nerve damage in your legs and feet causing decreased feeling in them. You may not notice minor injuries to your feet that could lead to infections or more serious problems. Taking care of your feet is one of the most important things you can do for yourself. Follow these instructions at home:  Wear shoes at all times, even in the house. Do not go barefoot. Bare feet are easily injured.  Check your feet daily for blisters, cuts, and redness. If you cannot see the bottom of your feet, use a mirror or ask someone for help.  Wash your feet with warm water (do not use hot water) and mild soap. Then pat your feet and the areas between your  toes until they are completely dry. Do not soak your feet as this can dry your skin.  Apply a moisturizing lotion or petroleum jelly (that does not contain alcohol and is unscented) to the skin on your feet and to dry, brittle toenails. Do not apply lotion between your toes.  Trim your toenails straight across. Do not dig under them or around the cuticle. File the edges of your nails with an emery board or nail file.  Do not cut corns or calluses or try to remove them with medicine.  Wear clean socks or stockings every day. Make sure they are not too tight. Do not wear knee-high stockings since they may decrease blood flow to your legs.  Wear shoes that fit properly and have enough cushioning. To break in new shoes, wear them for just a few hours a day. This prevents you from injuring your feet. Always look in your shoes before you put them on to be  sure there are no objects inside.  Do not cross your legs. This may decrease the blood flow to your feet.  If you find a minor scrape, cut, or break in the skin on your feet, keep it and the skin around it clean and dry. These areas may be cleansed with mild soap and water. Do not cleanse the area with peroxide, alcohol, or iodine.  When you remove an adhesive bandage, be sure not to damage the skin around it.  If you have a wound, look at it several times a day to make sure it is healing.  Do not use heating pads or hot water bottles. They may burn your skin. If you have lost feeling in your feet or legs, you may not know it is happening until it is too late.  Make sure your health care provider performs a complete foot exam at least annually or more often if you have foot problems. Report any cuts, sores, or bruises to your health care provider immediately. Contact a health care provider if:  You have an injury that is not healing.  You have cuts or breaks in the skin.  You have an ingrown nail.  You notice redness on your legs or feet.  You feel burning or tingling in your legs or feet.  You have pain or cramps in your legs and feet.  Your legs or feet are numb.  Your feet always feel cold. Get help right away if:  There is increasing redness, swelling, or pain in or around a wound.  There is a red line that goes up your leg.  Pus is coming from a wound.  You develop a fever or as directed by your health care provider.  You notice a bad smell coming from an ulcer or wound. This information is not intended to replace advice given to you by your health care provider. Make sure you discuss any questions you have with your health care provider. Document Released: 10/06/2000 Document Revised: 03/16/2016 Document Reviewed: 03/18/2013 Elsevier Interactive Patient Education  2017 [place] Prevention in the Home Falls can cause injuries. They can happen to people  of all ages. There are many things you can do to make your home safe and to help prevent falls. What can I do on the outside of my home?  Regularly fix the edges of walkways and driveways and fix any cracks.  Remove anything that might make you trip as you walk through a door, such as a raised step  or threshold.  Trim any bushes or trees on the path to your home.  Use bright outdoor lighting.  Clear any walking paths of anything that might make someone trip, such as rocks or tools.  Regularly check to see if handrails are loose or broken. Make sure that both sides of any steps have handrails.  Any raised decks and porches should have guardrails on the edges.  Have any leaves, snow, or ice cleared regularly.  Use sand or salt on walking paths during winter.  Clean up any spills in your garage right away. This includes oil or grease spills. What can I do in the bathroom?  Use night lights.  Install grab bars by the toilet and in the tub and shower. Do not use towel bars as grab bars.  Use non-skid mats or decals in the tub or shower.  If you need to sit down in the shower, use a plastic, non-slip stool.  Keep the floor dry. Clean up any water that spills on the floor as soon as it happens.  Remove soap buildup in the tub or shower regularly.  Attach bath mats securely with double-sided non-slip rug tape.  Do not have throw rugs and other things on the floor that can make you trip. What can I do in the bedroom?  Use night lights.  Make sure that you have a light by your bed that is easy to reach.  Do not use any sheets or blankets that are too big for your bed. They should not hang down onto the floor.  Have a firm chair that has side arms. You can use this for support while you get dressed.  Do not have throw rugs and other things on the floor that can make you trip. What can I do in the kitchen?  Clean up any spills right away.  Avoid walking on wet  floors.  Keep items that you use a lot in easy-to-reach places.  If you need to reach something above you, use a strong step stool that has a grab bar.  Keep electrical cords out of the way.  Do not use floor polish or wax that makes floors slippery. If you must use wax, use non-skid floor wax.  Do not have throw rugs and other things on the floor that can make you trip. What can I do with my stairs?  Do not leave any items on the stairs.  Make sure that there are handrails on both sides of the stairs and use them. Fix handrails that are broken or loose. Make sure that handrails are as long as the stairways.  Check any carpeting to make sure that it is firmly attached to the stairs. Fix any carpet that is loose or worn.  Avoid having throw rugs at the top or bottom of the stairs. If you do have throw rugs, attach them to the floor with carpet tape.  Make sure that you have a light switch at the top of the stairs and the bottom of the stairs. If you do not have them, ask someone to add them for you. What else can I do to help prevent falls?  Wear shoes that: ? Do not have high heels. ? Have rubber bottoms. ? Are comfortable and fit you well. ? Are closed at the toe. Do not wear sandals.  If you use a stepladder: ? Make sure that it is fully opened. Do not climb a closed stepladder. ? Make sure that both sides  of the stepladder are locked into place. ? Ask someone to hold it for you, if possible.  Clearly mark and make sure that you can see: ? Any grab bars or handrails. ? First and last steps. ? Where the edge of each step is.  Use tools that help you move around (mobility aids) if they are needed. These include: ? Canes. ? Walkers. ? Scooters. ? Crutches.  Turn on the lights when you go into a dark area. Replace any light bulbs as soon as they burn out.  Set up your furniture so you have a clear path. Avoid moving your furniture around.  If any of your floors are  uneven, fix them.  If there are any pets around you, be aware of where they are.  Review your medicines with your doctor. Some medicines can make you feel dizzy. This can increase your chance of falling. Ask your doctor what other things that you can do to help prevent falls. This information is not intended to replace advice given to you by your health care provider. Make sure you discuss any questions you have with your health care provider. Document Released: 08/05/2009 Document Revised: 03/16/2016 Document Reviewed: 11/13/2014 Elsevier Interactive Patient Education  2018 [place] Maintenance, Female Adopting a healthy lifestyle and getting preventive care can go a long way to promote health and wellness. Talk with your health care provider about what schedule of regular examinations is right for you. This is a good chance for you to check in with your provider about disease prevention and staying healthy. In between checkups, there are plenty of things you can do on your own. Experts have done a lot of research about which lifestyle changes and preventive measures are most likely to keep you healthy. Ask your health care provider for more information. Weight and diet Eat a healthy diet  Be sure to include plenty of vegetables, fruits, low-fat dairy products, and lean protein.  Do not eat a lot of foods high in solid fats, added sugars, or salt.  Get regular exercise. This is one of the most important things you can do for your health. ? Most adults should exercise for at least 150 minutes each week. The exercise should increase your heart rate and make you sweat (moderate-intensity exercise). ? Most adults should also do strengthening exercises at least twice a week. This is in addition to the moderate-intensity exercise.  Maintain a healthy weight  Body mass index (BMI) is a measurement that can be used to identify possible weight problems. It estimates body fat based on  height and weight. Your health care provider can help determine your BMI and help you achieve or maintain a healthy weight.  For females 59 years of age and older: ? A BMI below 18.5 is considered underweight. ? A BMI of 18.5 to 24.9 is normal. ? A BMI of 25 to 29.9 is considered overweight. ? A BMI of 30 and above is considered obese.  Watch levels of cholesterol and blood lipids  You should start having your blood tested for lipids and cholesterol at 65 years of age, then have this test every 5 years.  You may need to have your cholesterol levels checked more often if: ? Your lipid or cholesterol levels are high. ? You are older than 65 years of age. ? You are at high risk for heart disease.  Cancer screening Lung Cancer  Lung cancer screening is recommended for adults 59-80 years old who  are at high risk for lung cancer because of a history of smoking.  A yearly low-dose CT scan of the lungs is recommended for people who: ? Currently smoke. ? Have quit within the past 15 years. ? Have at least a 30-pack-year history of smoking. A pack year is smoking an average of one pack of cigarettes a day for 1 year.  Yearly screening should continue until it has been 15 years since you quit.  Yearly screening should stop if you develop a health problem that would prevent you from having lung cancer treatment.  Breast Cancer  Practice breast self-awareness. This means understanding how your breasts normally appear and feel.  It also means doing regular breast self-exams. Let your health care provider know about any changes, no matter how small.  If you are in your 20s or 30s, you should have a clinical breast exam (CBE) by a health care provider every 1-3 years as part of a regular health exam.  If you are 17 or older, have a CBE every year. Also consider having a breast X-ray (mammogram) every year.  If you have a family history of breast cancer, talk to your health care provider  about genetic screening.  If you are at high risk for breast cancer, talk to your health care provider about having an MRI and a mammogram every year.  Breast cancer gene (BRCA) assessment is recommended for women who have family members with BRCA-related cancers. BRCA-related cancers include: ? Breast. ? Ovarian. ? Tubal. ? Peritoneal cancers.  Results of the assessment will determine the need for genetic counseling and BRCA1 and BRCA2 testing.  Cervical Cancer Your health care provider may recommend that you be screened regularly for cancer of the pelvic organs (ovaries, uterus, and vagina). This screening involves a pelvic examination, including checking for microscopic changes to the surface of your cervix (Pap test). You may be encouraged to have this screening done every 3 years, beginning at age 62.  For women ages 29-65, health care providers may recommend pelvic exams and Pap testing every 3 years, or they may recommend the Pap and pelvic exam, combined with testing for human papilloma virus (HPV), every 5 years. Some types of HPV increase your risk of cervical cancer. Testing for HPV may also be done on women of any age with unclear Pap test results.  Other health care providers may not recommend any screening for nonpregnant women who are considered low risk for pelvic cancer and who do not have symptoms. Ask your health care provider if a screening pelvic exam is right for you.  If you have had past treatment for cervical cancer or a condition that could lead to cancer, you need Pap tests and screening for cancer for at least 20 years after your treatment. If Pap tests have been discontinued, your risk factors (such as having a new sexual partner) need to be reassessed to determine if screening should resume. Some women have medical problems that increase the chance of getting cervical cancer. In these cases, your health care provider may recommend more frequent screening and Pap  tests.  Colorectal Cancer  This type of cancer can be detected and often prevented.  Routine colorectal cancer screening usually begins at 65 years of age and continues through 64 years of age.  Your health care provider may recommend screening at an earlier age if you have risk factors for colon cancer.  Your health care provider may also recommend using home test kits to  check for hidden blood in the stool.  A small camera at the end of a tube can be used to examine your colon directly (sigmoidoscopy or colonoscopy). This is done to check for the earliest forms of colorectal cancer.  Routine screening usually begins at age 36.  Direct examination of the colon should be repeated every 5-10 years through 65 years of age. However, you may need to be screened more often if early forms of precancerous polyps or small growths are found.  Skin Cancer  Check your skin from head to toe regularly.  Tell your health care provider about any new moles or changes in moles, especially if there is a change in a mole's shape or color.  Also tell your health care provider if you have a mole that is larger than the size of a pencil eraser.  Always use sunscreen. Apply sunscreen liberally and repeatedly throughout the day.  Protect yourself by wearing long sleeves, pants, a wide-brimmed hat, and sunglasses whenever you are outside.  Heart disease, diabetes, and high blood pressure  High blood pressure causes heart disease and increases the risk of stroke. High blood pressure is more likely to develop in: ? People who have blood pressure in the high end of the normal range (130-139/85-89 mm Hg). ? People who are overweight or obese. ? People who are African American.  If you are 63-49 years of age, have your blood pressure checked every 3-5 years. If you are 55 years of age or older, have your blood pressure checked every year. You should have your blood pressure measured twice-once when you are at  a hospital or clinic, and once when you are not at a hospital or clinic. Record the average of the two measurements. To check your blood pressure when you are not at a hospital or clinic, you can use: ? An automated blood pressure machine at a pharmacy. ? A home blood pressure monitor.  If you are between 56 years and 41 years old, ask your health care provider if you should take aspirin to prevent strokes.  Have regular diabetes screenings. This involves taking a blood sample to check your fasting blood sugar level. ? If you are at a normal weight and have a low risk for diabetes, have this test once every three years after 65 years of age. ? If you are overweight and have a high risk for diabetes, consider being tested at a younger age or more often. Preventing infection Hepatitis B  If you have a higher risk for hepatitis B, you should be screened for this virus. You are considered at high risk for hepatitis B if: ? You were born in a country where hepatitis B is common. Ask your health care provider which countries are considered high risk. ? Your parents were born in a high-risk country, and you have not been immunized against hepatitis B (hepatitis B vaccine). ? You have HIV or AIDS. ? You use needles to inject street drugs. ? You live with someone who has hepatitis B. ? You have had sex with someone who has hepatitis B. ? You get hemodialysis treatment. ? You take certain medicines for conditions, including cancer, organ transplantation, and autoimmune conditions.  Hepatitis C  Blood testing is recommended for: ? Everyone born from [birth year] through 1964. ? Anyone with known risk factors for hepatitis C.  Sexually transmitted infections (STIs)  You should be screened for sexually transmitted infections (STIs) including gonorrhea and chlamydia if: ? You are  sexually active and are younger than 65 years of age. ? You are older than 65 years of age and your health care provider tells  you that you are at risk for this type of infection. ? Your sexual activity has changed since you were last screened and you are at an increased risk for chlamydia or gonorrhea. Ask your health care provider if you are at risk.  If you do not have HIV, but are at risk, it may be recommended that you take a prescription medicine daily to prevent HIV infection. This is called pre-exposure prophylaxis (PrEP). You are considered at risk if: ? You are sexually active and do not regularly use condoms or know the HIV status of your partner(s). ? You take drugs by injection. ? You are sexually active with a partner who has HIV.  Talk with your health care provider about whether you are at high risk of being infected with HIV. If you choose to begin PrEP, you should first be tested for HIV. You should then be tested every 3 months for as long as you are taking PrEP. Pregnancy  If you are premenopausal and you may become pregnant, ask your health care provider about preconception counseling.  If you may become pregnant, take 400 to 800 micrograms (mcg) of folic acid every day.  If you want to prevent pregnancy, talk to your health care provider about birth control (contraception). Osteoporosis and menopause  Osteoporosis is a disease in which the bones lose minerals and strength with aging. This can result in serious bone fractures. Your risk for osteoporosis can be identified using a bone density scan.  If you are 31 years of age or older, or if you are at risk for osteoporosis and fractures, ask your health care provider if you should be screened.  Ask your health care provider whether you should take a calcium or vitamin D supplement to lower your risk for osteoporosis.  Menopause may have certain physical symptoms and risks.  Hormone replacement therapy may reduce some of these symptoms and risks. Talk to your health care provider about whether hormone replacement therapy is right for  you. Follow these instructions at home:  Schedule regular health, dental, and eye exams.  Stay current with your immunizations.  Do not use any tobacco products including cigarettes, chewing tobacco, or electronic cigarettes.  If you are pregnant, do not drink alcohol.  If you are breastfeeding, limit how much and how often you drink alcohol.  Limit alcohol intake to no more than 1 drink per day for nonpregnant women. One drink equals 12 ounces of beer, 5 ounces of wine, or 1 ounces of hard liquor.  Do not use street drugs.  Do not share needles.  Ask your health care provider for help if you need support or information about quitting drugs.  Tell your health care provider if you often feel depressed.  Tell your health care provider if you have ever been abused or do not feel safe at home. This information is not intended to replace advice given to you by your health care provider. Make sure you discuss any questions you have with your health care provider. Document Released: 04/24/2011 Document Revised: 03/16/2016 Document Reviewed: 07/13/2015 Elsevier Interactive Patient Education  [employer].

## 2017-07-05 NOTE — Progress Notes (Signed)
KIM, HANNAH R., DO  

## 2017-07-06 NOTE — Telephone Encounter (Signed)
Spoke with patient and she would results mailed out and would also like GYN updated to Dr. Arelia Sneddon

## 2017-07-20 ENCOUNTER — Encounter: Payer: Self-pay | Admitting: Cardiovascular Disease

## 2017-07-24 ENCOUNTER — Ambulatory Visit (INDEPENDENT_AMBULATORY_CARE_PROVIDER_SITE_OTHER): Payer: Medicare HMO | Admitting: Podiatry

## 2017-07-24 ENCOUNTER — Encounter: Payer: Self-pay | Admitting: Podiatry

## 2017-07-24 ENCOUNTER — Ambulatory Visit (INDEPENDENT_AMBULATORY_CARE_PROVIDER_SITE_OTHER): Payer: Medicare HMO

## 2017-07-24 VITALS — BP 131/70 | HR 93

## 2017-07-24 DIAGNOSIS — M2041 Other hammer toe(s) (acquired), right foot: Secondary | ICD-10-CM

## 2017-07-24 NOTE — Patient Instructions (Signed)
Hammer Toe Hammer toe is a change in the shape (a deformity) of your second, third, or fourth toe. The deformity causes the middle joint of your toe to stay bent. This causes pain, especially when you are wearing shoes. Hammer toe starts gradually. At first, the toe can be straightened. Gradually over time, the deformity becomes stiff and permanent. Early treatments to keep the toe straight may relieve pain. As the deformity becomes stiff and permanent, surgery may be needed to straighten the toe. What are the causes? Hammer toe is caused by abnormal bending of the toe joint that is closest to your foot. It happens gradually over time. This pulls on the muscles and connections (tendons) of the toe joint, making them weak and stiff. It is often related to wearing shoes that are too short or narrow and do not let your toes straighten. What increases the risk? You may be at greater risk for hammer toe if you:  Are female.  Are older.  Wear shoes that are too small.  Wear high-heeled shoes that pinch your toes.  Are a ballet dancer.  Have a second toe that is longer than your big toe (first toe).  Injure your foot or toe.  Have arthritis.  Have a family history of hammer toe.  Have a nerve or muscle disorder.  What are the signs or symptoms? The main symptoms of this condition are pain and deformity of the toe. The pain is worse when wearing shoes, walking, or running. Other symptoms may include:  Corns or calluses over the bent part of the toe or between the toes.  Redness and a burning feeling on the toe.  An open sore that forms on the top of the toe.  Not being able to straighten the toe.  How is this diagnosed? This condition is diagnosed based on your symptoms and a physical exam. During the exam, your health care provider will try to straighten your toe to see how stiff the deformity is. You may also have tests, such as:  A blood test to check for rheumatoid  arthritis.  An X-ray to show how severe the deformity is.  How is this treated? Treatment for this condition will depend on how stiff the deformity is. Surgery is often needed. However, sometimes a hammer toe can be straightened without surgery. Treatments that do not involve surgery include:  Taping the toe into a straightened position.  Using pads and cushions to protect the toe (orthotics).  Wearing shoes that provide enough room for the toes.  Doing toe-stretching exercises at home.  Taking an NSAID to reduce pain and swelling.  If these treatments do not help or the toe cannot be straightened, surgery is the next option. The most common surgeries used to straighten a hammer toe include:  Arthroplasty. In this procedure, part of the joint is removed, and that allows the toe to straighten.  Fusion. In this procedure, cartilage between the two bones of the joint is taken out and the bones are fused together into one longer bone.  Implantation. In this procedure, part of the bone is removed and replaced with an implant to let the toe move again.  Flexor tendon transfer. In this procedure, the tendons that curl the toes down (flexor tendons) are repositioned.  Follow these instructions at home:  Take over-the-counter and prescription medicines only as told by your health care provider.  Do toe straightening and stretching exercises as told by your health care provider.  Keep all   follow-up visits as told by your health care provider. This is important. How is this prevented?  Wear shoes that give your toes enough room and do not cause pain.  Do not wear high-heeled shoes. Contact a health care provider if:  Your pain gets worse.  Your toe becomes red or swollen.  You develop an open sore on your toe. This information is not intended to replace advice given to you by your health care provider. Make sure you discuss any questions you have with your health care  provider. Document Released: 10/06/2000 Document Revised: 04/28/2016 Document Reviewed: 02/02/2016 Elsevier Interactive Patient Education  2018 [employer]   Diabetes and Foot Care Diabetes may cause you to have problems because of poor blood supply (circulation) to your feet and legs. This may cause the skin on your feet to become thinner, break easier, and heal more slowly. Your skin may become dry, and the skin may peel and crack. You may also have nerve damage in your legs and feet causing decreased feeling in them. You may not notice minor injuries to your feet that could lead to infections or more serious problems. Taking care of your feet is one of the most important things you can do for yourself. Follow these instructions at home:  Wear shoes at all times, even in the house. Do not go barefoot. Bare feet are easily injured.  Check your feet daily for blisters, cuts, and redness. If you cannot see the bottom of your feet, use a mirror or ask someone for help.  Wash your feet with warm water (do not use hot water) and mild soap. Then pat your feet and the areas between your toes until they are completely dry. Do not soak your feet as this can dry your skin.  Apply a moisturizing lotion or petroleum jelly (that does not contain alcohol and is unscented) to the skin on your feet and to dry, brittle toenails. Do not apply lotion between your toes.  Trim your toenails straight across. Do not dig under them or around the cuticle. File the edges of your nails with an emery board or nail file.  Do not cut corns or calluses or try to remove them with medicine.  Wear clean socks or stockings every day. Make sure they are not too tight. Do not wear knee-high stockings since they may decrease blood flow to your legs.  Wear shoes that fit properly and have enough cushioning. To break in new shoes, wear them for just a few hours a day. This prevents you from injuring your feet. Always look in  your shoes before you put them on to be sure there are no objects inside.  Do not cross your legs. This may decrease the blood flow to your feet.  If you find a minor scrape, cut, or break in the skin on your feet, keep it and the skin around it clean and dry. These areas may be cleansed with mild soap and water. Do not cleanse the area with peroxide, alcohol, or iodine.  When you remove an adhesive bandage, be sure not to damage the skin around it.  If you have a wound, look at it several times a day to make sure it is healing.  Do not use heating pads or hot water bottles. They may burn your skin. If you have lost feeling in your feet or legs, you may not know it is happening until it is too late.  Make sure your health care   provider performs a complete foot exam at least annually or more often if you have foot problems. Report any cuts, sores, or bruises to your health care provider immediately. Contact a health care provider if:  You have an injury that is not healing.  You have cuts or breaks in the skin.  You have an ingrown nail.  You notice redness on your legs or feet.  You feel burning or tingling in your legs or feet.  You have pain or cramps in your legs and feet.  Your legs or feet are numb.  Your feet always feel cold. Get help right away if:  There is increasing redness, swelling, or pain in or around a wound.  There is a red line that goes up your leg.  Pus is coming from a wound.  You develop a fever or as directed by your health care provider.  You notice a bad smell coming from an ulcer or wound. This information is not intended to replace advice given to you by your health care provider. Make sure you discuss any questions you have with your health care provider. Document Released: 10/06/2000 Document Revised: 03/16/2016 Document Reviewed: 03/18/2013 Elsevier Interactive Patient Education  2017 Else[employer] Inc.  

## 2017-07-24 NOTE — Progress Notes (Signed)
   Subjective:    [name], female    DOB: August 10, 1951, 66 y.o.   MRN: [id number]  HPI This patient presents today primarily concerned about a painful hammertoe on the right foot for the past year. Patient says the toe is uncomfortable when wearing closed shoes. She says that she's concerned that the toe will progressively become more deformed over time and would like evaluation and would like to consider correction of the toe Patient states that she is a diabetic, however, patient denies taking any medication for multiple years for diabetes Patient is a former smoker  Review of Systems  All other systems reviewed and are negative.      Objective:   Physical Exam  Appears orientated 3  Vascular: No calf edema or calf tenderness bilaterally DP pulses 2/4 bilaterally PT pulses 2/4 bilaterally Capillary reflex within normal limits bilaterally  Neurological: Sensation to 10 g monofilament wire intact 8/8 bilaterally Vibratory sensation reactive bilaterally Ankle reflexes reactive bilaterally  Dermatological: No open skin lesions bilaterally Well-healed surgical scars dorsal medial first MPJ bilaterally Plantar callus first and fifth MPJ bilaterally Corn fifth right toe Residual HAV deformity right Rigid contracture PIPJ second right toe  Musculoskeletal: Weightbearing patient has some recurrence of HAV deformity Hammertoe second right No restriction ankle, subtalar, midtarsal, MPJ bilaterally  X-ray examination weightbearing right foot dated 07/24/2017 Intact bony structures without fracture or dislocation Retained internal screw fixation base first metatarsal Residual HAV deformity  Hammertoe second right  Radiographic impression: No acute bony abnormality noted in the right foot Areas of previous surgical correction of bunion deformity right with with residual HAV Hammertoe second right      Assessment & Plan:   Assessment: Pre-diabetic with  satisfactory neurovascular status 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 We discussed nonsurgical and surgical treatment of hammertoe deformities. Patient has interested in possible surgical correction hammertoe and does question the partial recurrence of bunion deformity. I refer patient to Dr. Samuella Cota for further evaluation and discuss of treatment options.

## 2017-07-25 ENCOUNTER — Telehealth: Payer: Self-pay | Admitting: Internal Medicine

## 2017-07-25 ENCOUNTER — Ambulatory Visit (INDEPENDENT_AMBULATORY_CARE_PROVIDER_SITE_OTHER): Payer: Medicare HMO

## 2017-07-25 ENCOUNTER — Encounter (INDEPENDENT_AMBULATORY_CARE_PROVIDER_SITE_OTHER): Payer: Self-pay | Admitting: Orthopaedic Surgery

## 2017-07-25 ENCOUNTER — Ambulatory Visit (INDEPENDENT_AMBULATORY_CARE_PROVIDER_SITE_OTHER): Payer: Medicare HMO | Admitting: Orthopaedic Surgery

## 2017-07-25 VITALS — BP 121/74 | HR 89

## 2017-07-25 DIAGNOSIS — M25511 Pain in right shoulder: Secondary | ICD-10-CM

## 2017-07-25 DIAGNOSIS — M542 Cervicalgia: Secondary | ICD-10-CM

## 2017-07-25 DIAGNOSIS — Z981 Arthrodesis status: Secondary | ICD-10-CM | POA: Insufficient documentation

## 2017-07-25 DIAGNOSIS — M7541 Impingement syndrome of right shoulder: Secondary | ICD-10-CM | POA: Insufficient documentation

## 2017-07-25 MED ORDER — BUPIVACAINE HCL 0.25 % IJ SOLN
4.0000 mL | INTRAMUSCULAR | Status: AC | PRN
  Administered 2017-07-25: 4 mL via INTRA_ARTICULAR

## 2017-07-25 MED ORDER — LIDOCAINE HCL 1 % IJ SOLN
1.0000 mL | INTRAMUSCULAR | Status: AC | PRN
  Administered 2017-07-25: 1 mL

## 2017-07-25 MED ORDER — METHYLPREDNISOLONE ACETATE 40 MG/ML IJ SUSP
40.0000 mg | INTRAMUSCULAR | Status: AC | PRN
  Administered 2017-07-25: 40 mg via INTRA_ARTICULAR

## 2017-07-25 NOTE — Telephone Encounter (Signed)
Spoke with pt and informed her that our EMR only goes back as far as 2012. She wanted to know what the last medication she took for diabetes and what year. Informed her she had no additional questions at this time. Nothing further is needed

## 2017-07-25 NOTE — Progress Notes (Signed)
Office Visit Note   Patient: Desiree [name]           Date of Birth: 10/19/1951           MRN: [id number] Visit Date: 07/25/2017              Requested by: Madelin Headings, MD [address] [place], [place] [address] PCP: Madelin Headings, MD   Assessment & Plan: Visit Diagnoses:  1. Neck pain   2. Acute pain of right shoulder   3. S/P lumbar fusion   4. Impingement syndrome of right shoulder   5. Hx of fusion of cervical spine     Plan: Patient has some right shoulder osteoarthritis with marginal osteophytes. She also has impingement and subacromial injection performed which she tolerated well. We'll recheck her again in 2 months.  Follow-Up Instructions: Return in about 2 months (around 09/24/2017).   Orders:  Orders Placed This Encounter  Procedures  . Large Joint Injection/Arthrocentesis  . XR Cervical Spine 2 or 3 views  . XR Shoulder Right   No orders of the defined types were placed in this encounter.     Procedures: Large Joint Inj Date/Time: 07/25/2017 9:57 AM Performed by: Eldred Manges Authorized by: Eldred Manges   Consent Given by:  Patient Site marked: the procedure site was marked   Indications:  Pain Location:  Shoulder Site:  R subacromial bursa Needle Size:  22 G Needle Length:  1.5 inches Ultrasound Guidance: No   Fluoroscopic Guidance: No   Arthrogram: No   Medications:  1 mL lidocaine 1 %; 40 mg methylPREDNISolone acetate 40 MG/ML; 4 mL bupivacaine 0.25 % Aspiration Attempted: No   Patient tolerance:  Patient tolerated the procedure well with no immediate complications     Clinical Data: No additional findings.   Subjective: Chief Complaint  Patient presents with  . Neck - Pain  . Right Shoulder - Pain    HPI 66 year old female seen with right shoulder pain. Pain radiates sometimes and her hand most time it's in her shoulder region related to shoulder range of motion. Negative elbow. She has some discomfort in the neck as  well. Previous C5-6 fusion December 2017. 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 Ophelia Charter 2011. She has 2 mm anterolisthesis at L4-5 below her fusion documented on MRI scan and serial x-rays most recent MRI last year. She denies bowel or bladder symptoms she gets some easing of her pain with Tylenol. She has diabetes and had last A1c below 6.5.  Review of Systems U of systems updated from last office visit and is unchanged other than as mentioned in history of present illness. She has a daughter has metastatic lung cancer with brain metastases which patients having difficulty dealing with. Bipolar disorder, tardive dyskinesia and neuroleptic induced, diabetes in good control former smoker otherwise negative and as it pertains history of present illness.   Objective: Vital Signs: BP 121/74   Pulse 89   Physical Exam  Constitutional: She is oriented to person, place, and time. She appears well-developed.  HENT:  Head: Normocephalic.  Right Ear: External ear normal.  Left Ear: External ear normal.  Eyes: Pupils are equal, round, and reactive to light.  Neck: No tracheal deviation present. No thyromegaly present.  Cardiovascular: Normal rate.   Pulmonary/Chest: Effort normal.  Abdominal: Soft.  Neurological: She is alert and oriented to person,  place, and time.  Skin: Skin is warm and dry.  Psychiatric: She has a normal mood and affect. Her behavior is normal.    Ortho Exam patient has some positive impingement right shoulder. Some crepitus with range of motion. She mentally rotate with hand posterior axillary line. Minimal discomfort with crossarm adduction. Upper extremity reflexes are 2+ and symmetrical well-healed anterior incision no pain with cervical flexion extension minimal brachioplexus tenderness. Large summary reflexes are 2+ normal heel toe gait. Lumbar incisions well-healed.  Specialty  Comments:  No specialty comments available.  Imaging: Dg Foot Complete Right  Result Date: 07/24/2017 Please see detailed radiograph report in office note.  Xr Cervical Spine 2 Or 3 Views  Result Date: 07/25/2017 Two-view x-ray cervical spine obtained and reviewed. This shows previous C5-6 fusion with allograft with partial bone incorporation. No screw loosening. She has degenerative facet changes above and below unchanged from previous images. Impression: Postop C5-6 cervical fusion with allograft and plate. Progressive graft incorporation is noted.  Xr Shoulder Right  Result Date: 07/25/2017 Three-view x-rays right shoulder obtained and reviewed. This shows some joint space narrowing and marginal osteophytes at the glenohumeral joint. No high riding of the humeral head. Mild acromioclavicular degenerative changes. Impression: Low humeral osteophytes with joint space narrowing consistent with osteoarthritis of the right shoulder    PMFS History: Patient Active Problem List   Diagnosis Date Noted  . Impingement syndrome of right shoulder 07/25/2017  . S/P lumbar fusion 07/25/2017  . Hx of fusion of cervical spine 07/25/2017  . Cervical spinal stenosis 09/27/2016  . Cervical spondylosis 09/27/2016  . Closed displaced fracture of proximal phalanx of lesser toe of right foot 09/08/2016  . Spinal stenosis of cervical region 09/08/2016  . Polypharmacy 06/23/2015  . Hyperlipidemia 04/19/2015  . Visit for preventive health examination 01/01/2015  . Primary osteoarthritis involving multiple joints 01/01/2015  . Bipolar affective disorder, currently depressed, moderate (HCC)   . Bipolar I disorder with mania (HCC) 08/17/2014  . Foot cramps 07/03/2014  . Hypertension 10/21/2013  . Ear fullness 10/21/2013  . Dizziness 03/25/2013  . Medication withdrawal (HCC) 03/05/2013  . Diabetes mellitus with renal manifestations, controlled (HCC) 11/10/2012  . Alopecia areata 02/06/2012  . Obesity  (BMI 30-39.9) 02/06/2012  . Renal insufficiency 07/16/2011  . Bipolar disorder (HCC)   . Neuroleptic-induced tardive dyskinesia   . Exertional dyspnea 02/07/2011  . Dyspnea on exertion 01/19/2011  . DIABETES MELLITUS, TYPE II, CONTROLLED 04/22/2010  . Carpal tunnel syndrome 02/02/2010  . CONSTIPATION 02/02/2010  . DEGENERATIVE JOINT DISEASE 02/02/2010  . TOBACCO USE, QUIT 07/28/2009  . CATARACTS 04/13/2009  . PAIN IN JOINT, ANKLE AND FOOT 09/16/2008  . EOSINOPHILIA 07/21/2008  . AFFECTIVE DISORDER 04/21/2008  . BACK PAIN, CHRONIC 02/12/2008  . LEG PAIN 02/12/2008  . ABNORMAL INVOLUNTARY MOVEMENTS 12/04/2007  . POSTURAL LIGHTHEADEDNESS 11/04/2007  . COLONIC POLYPS, HX OF 11/04/2007  . Essential hypertension 06/17/2007  . HYPERLIPIDEMIA 04/08/2007  . MITRAL VALVE PROLAPSE 04/08/2007  . GERD 04/08/2007  . LOW BACK PAIN SYNDROME 04/08/2007  . CHEST PAIN, RECURRENT 04/08/2007   Past Medical History:  Diagnosis Date  . Anxiety   . Bipolar disorder (HCC)   . CANDIDIASIS, ESOPHAGEAL 07/28/2009   Qualifier: Diagnosis of  By: Fabian Sharp MD, Neta Mends   . Chronic kidney disease    CKD III  . COPD (chronic obstructive pulmonary disease) (HCC)   . Depression   . Diabetes mellitus without complication (HCC)    no meds  . FH: colonic  polyps   . Fractured elbow    right   . GERD (gastroesophageal reflux disease)   . HH (hiatus hernia)   . History of carpal tunnel syndrome   . History of chest pain   . History of transfusion of packed red blood cells   . Hyperlipidemia   . Hypertension   . Neuroleptic-induced tardive dyskinesia   . Osteoarthritis of more than one site   . Seasonal allergies     Family History  Problem Relation Age of Onset  . Heart attack Father   . Heart disease Father   . Throat cancer Brother   . Diabetes Mother   . Hypertension Mother   . Heart attack Mother   . Diabetes type II Brother   . Heart disease Brother   . Breast cancer Cousin     Past Surgical  History:  Procedure Laterality Date  . ANTERIOR CERVICAL DECOMP/DISCECTOMY FUSION  09/27/2016   C5-6 anterior cervical discectomy and fusion, allograft and plate/notes 60/04/3709  . ANTERIOR CERVICAL DECOMP/DISCECTOMY FUSION N/A 09/27/2016   Procedure: C5-6 Anterior Cervical Discectomy and Fusion, Allograft and Plate;  Surgeon: Eldred Manges, MD;  Location: [facility] OR;  Service: Orthopedics;  Laterality: N/A;  . Back Fusion  2002  . CARPAL TUNNEL RELEASE  Janila Arrazola   left  . COLONOSCOPY N/A 01/05/2014   Procedure: COLONOSCOPY;  Surgeon: Charna Elizabeth, MD;  Location: [facility];  Service: Endoscopy;  Laterality: N/A;  . EXTERNAL EAR SURGERY Left   . EYE SURGERY     "removed white dots under eyelid"  . FINGER SURGERY Left   . Juvara osteomy    . KNEE SURGERY    . Rt. toe bunion    . skin, shave biopsy  05/03/2016   Left occipital scalp, top of scalp   Social History   Occupational History  . Not on file.   Social History Main Topics  . Smoking status: Former Smoker    Packs/day: 2.00    Years: 20.00    Types: Cigarettes    Quit date: 10/23/2001  . Smokeless tobacco: Never Used     Comment: referred to pulmonary for consult LDCT   . Alcohol use No  . Drug use: No  . Sexual activity: Not on file

## 2017-07-25 NOTE — Telephone Encounter (Signed)
Pt would like the know the last year dr Fabian Sharp took her off her dm meds

## 2017-07-30 ENCOUNTER — Telehealth: Payer: Self-pay | Admitting: Internal Medicine

## 2017-07-30 NOTE — Telephone Encounter (Signed)
Pt would like to see if Dr. Fabian Sharp would approve a DME for her to get a humidifier.  Pharm:  [facility]

## 2017-07-31 DIAGNOSIS — Z6826 Body mass index (BMI) 26.0-26.9, adult: Secondary | ICD-10-CM | POA: Diagnosis not present

## 2017-07-31 DIAGNOSIS — Z01419 Encounter for gynecological examination (general) (routine) without abnormal findings: Secondary | ICD-10-CM | POA: Diagnosis not present

## 2017-07-31 DIAGNOSIS — N644 Mastodynia: Secondary | ICD-10-CM | POA: Diagnosis not present

## 2017-07-31 NOTE — Progress Notes (Signed)
Cardiology Office Note   Date:  08/01/2017   ID:  Desiree [name], DOB 1950/12/25, MRN [id number]  PCP:  [name], MD  Cardiologist:   [name], MD   No chief complaint on file.     History of Present Illness: Desiree [name] is a 66 y.o. female who presents for consultation regarding chest pain. Referred by Dr Desiree [name]. Seen by Dr Montez Morita 1980 diagnosed with MVP CRF;s DM previous smoker quit in 2002 had normal myovue in 2016  Bipolar followed by Desiree [name]   Echo 06/27/17 EF 65-70% no definitive SAM LVOT gradient 25 mmHg with valsalva  no MR  Septum described as severely thickened but measured at 14.5 mm   She has totally atypical chest pain Sharp fleeting non exertional for months Both right And left sided. This is not angina   Denies palpitations dyspnea or syncope  Only daughter has metastatic breast CA and is likely to be in hospice soon   Past Medical History:  Diagnosis Date  . Anxiety   . Bipolar disorder ([place])   . CANDIDIASIS, ESOPHAGEAL 07/28/2009   Qualifier: Diagnosis of  By: Desiree Bill MD, [name]   . Chronic kidney disease    CKD III  . COPD (chronic obstructive pulmonary disease) ([place])   . Depression   . Diabetes mellitus without complication (HCC)    no meds  . FH: colonic polyps   . Fractured elbow    right   . GERD (gastroesophageal reflux disease)   . HH (hiatus hernia)   . History of carpal tunnel syndrome   . History of chest pain   . History of transfusion of packed red blood cells   . Hyperlipidemia   . Hypertension   . Neuroleptic-induced tardive dyskinesia   . Osteoarthritis of more than one site   . Seasonal allergies     Past Surgical History:  Procedure Laterality Date  . ANTERIOR CERVICAL DECOMP/DISCECTOMY FUSION  09/27/2016   C5-6 anterior cervical discectomy and fusion, allograft and plate/notes 09/27/2016  . ANTERIOR CERVICAL DECOMP/DISCECTOMY FUSION N/A 09/27/2016   Procedure: C5-6 Anterior Cervical Discectomy and  Fusion, Allograft and Plate;  Surgeon: Desiree Killings, MD;  Location: [facility];  Service: Orthopedics;  Laterality: N/A;  . Back Fusion  2002  . CARPAL TUNNEL RELEASE  yates   left  . COLONOSCOPY N/A 01/05/2014   Procedure: COLONOSCOPY;  Surgeon: [name], MD;  Location: [facility];  Service: Endoscopy;  Laterality: N/A;  . EXTERNAL EAR SURGERY Left   . EYE SURGERY     "removed white dots under eyelid"  . FINGER SURGERY Left   . Juvara osteomy    . KNEE SURGERY    . Rt. toe bunion    . skin, shave biopsy  05/03/2016   Left occipital scalp, top of scalp     Current Outpatient Prescriptions  Medication Sig Dispense Refill  . acetaminophen (TYLENOL) 500 MG tablet Take 2 tablets (1,000 mg total) by mouth every 6 (six) hours as needed. (Patient taking differently: Take 1,000 mg by mouth every 6 (six) hours as needed (for pain/headache.). ) 30 tablet 0  . atorvastatin (LIPITOR) 20 MG tablet take 1 tablet by mouth once daily 90 tablet 0  . Blood Glucose Monitoring Suppl (ACCU-CHEK NANO SMARTVIEW) W/DEVICE KIT Use to check blood sugar 1-2 times daily (Patient taking differently: 1 strip by Other route 2 (two) times a week. ) 1 kit 0  . cetirizine (ZYRTEC) 10 MG  tablet Take 10 mg by mouth at bedtime.     . docusate sodium (COLACE) 100 MG capsule Take 100 mg by mouth at bedtime.     . DULoxetine (CYMBALTA) 60 MG capsule Take 1 capsule (60 mg total) by mouth daily. 30 capsule 0  . fluticasone (FLONASE) 50 MCG/ACT nasal spray Place 1-2 sprays into both nostrils at bedtime.     Marland Kitchen glucose blood test strip Accu-chek nano, test glucose once daily, dx E11.9 100 each 12  . lamoTRIgine (LAMICTAL) 25 MG tablet Take 1 tablet (25 mg total) by mouth 2 (two) times daily. 14 tablet 0  . Lancets Misc. (ACCU-CHEK FASTCLIX LANCET) KIT USE TO CHECK BLOOD SUGAR 1-2 TIMES DAILY 1 kit 0  . losartan (COZAAR) 25 MG tablet take 1 tablet by mouth once daily 90 tablet 1  . Multiple Vitamins-Minerals (CENTRUM SILVER PO)  Take 1 tablet by mouth daily.     . ondansetron (ZOFRAN-ODT) 4 MG disintegrating tablet Take 1 tablet (4 mg total) by mouth every 8 (eight) hours as needed for nausea or vomiting. 20 tablet 0  . OVER THE COUNTER MEDICATION Take 1 capsule by mouth every morning. IBgard     . pantoprazole (PROTONIX) 40 MG tablet Take 40 mg by mouth daily.    . Probiotic Product (ALIGN PO) Take 1 capsule by mouth at bedtime.      No current facility-administered medications for this visit.     Allergies:   Amoxicillin; Codeine; Hydrocodone-acetaminophen; and Penicillins    Social History:  The patient  reports that she quit smoking about 15 years ago. Her smoking use included Cigarettes. She has a 40.00 pack-year smoking history. She has never used smokeless tobacco. She reports that she does not drink alcohol or use drugs.   Family History:  The patient's family history includes Breast cancer in her cousin; Diabetes in her mother; Diabetes type II in her brother; Heart attack in her father and mother; Heart disease in her brother and father; Hypertension in her mother; Throat cancer in her brother.    ROS:  Please see the history of present illness.   Otherwise, review of systems are positive for none.   All other systems are reviewed and negative.    PHYSICAL EXAM: VS:  BP (!) 100/56   Pulse 86   Ht _0  (1.6 m)   Wt 154 lb (69.9 kg)   SpO2 95%   BMI 27.28 kg/m  , BMI Body mass index is 27.28 kg/m. Affect appropriate Healthy:  appears stated age 58: normal Neck supple with no adenopathy JVP normal no bruits no thyromegaly Lungs clear with no wheezing and good diaphragmatic motion Heart:  S1/S2  Soft SEM not increased with valsalva , no rub, gallop or click PMI normal Abdomen: benighn, BS positve, no tenderness, no AAA no bruit.  No HSM or HJR Distal pulses intact with no bruits No edema Neuro non-focal Skin warm and dry No muscular weakness    EKG:  12/06/16 SR LAD borderline LVH     Recent Labs: 12/05/2016: ALT 19; BUN 12; Creatinine, Ser 1.18; Hemoglobin 12.6; Platelets 318; Potassium 3.7; Sodium 141    Lipid Panel    Component Value Date/Time   CHOL 149 12/31/2015 0808   TRIG 75.0 12/31/2015 0808   HDL 48.10 12/31/2015 0808   CHOLHDL 3 12/31/2015 0808   VLDL 15.0 12/31/2015 0808   LDLCALC 85 12/31/2015 0808   LDLDIRECT 146.3 01/26/2010 0000      Wt Readings  from Last 3 Encounters:  08/01/17 154 lb (69.9 kg)  07/05/17 157 lb (71.2 kg)  06/19/17 152 lb 6.4 oz (69.1 kg)      Other studies Reviewed: Additional studies/ records that were reviewed today include: Notes Dr Erin Hearing and brackbill 2016 myovue 2016 recent echo Notes primary care .    ASSESSMENT AND PLAN:  1.  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 Chest pain non cardiac no need for stress testing at this time  3. Bipolar seem compensated and functional  4. DM Discussed low carb diet.  Target hemoglobin A1c is 6.5 or less.  Continue current medications. 5. HtN:  Well controlled.  Continue current medications and low sodium Dash type diet.      Current medicines are reviewed at length with the patient today.  The patient does not have concerns regarding medicines.  The following changes have been made:  no change  Labs/ tests ordered today include: Echo in a year  No orders of the defined types were placed in this encounter.    Disposition:   FU with cardiology in a year      Signed, [name], MD  08/01/2017 11:18 AM    [address], [place], [place]  [address] Phone: [phone]; Fax: [phone]

## 2017-08-01 ENCOUNTER — Encounter: Payer: Self-pay | Admitting: Cardiovascular Disease

## 2017-08-01 ENCOUNTER — Ambulatory Visit (INDEPENDENT_AMBULATORY_CARE_PROVIDER_SITE_OTHER): Payer: Medicare HMO | Admitting: Cardiovascular Disease

## 2017-08-01 ENCOUNTER — Ambulatory Visit (INDEPENDENT_AMBULATORY_CARE_PROVIDER_SITE_OTHER): Payer: Medicare HMO | Admitting: Podiatry

## 2017-08-01 ENCOUNTER — Encounter: Payer: Self-pay | Admitting: Podiatry

## 2017-08-01 VITALS — BP 121/65 | HR 98 | Resp 18

## 2017-08-01 VITALS — BP 100/56 | HR 86 | Ht 63.0 in | Wt 154.0 lb

## 2017-08-01 DIAGNOSIS — T84293S Other mechanical complication of internal fixation device of bones of foot and toes, sequela: Secondary | ICD-10-CM | POA: Diagnosis not present

## 2017-08-01 DIAGNOSIS — M79673 Pain in unspecified foot: Secondary | ICD-10-CM | POA: Diagnosis not present

## 2017-08-01 DIAGNOSIS — I10 Essential (primary) hypertension: Secondary | ICD-10-CM

## 2017-08-01 DIAGNOSIS — I421 Obstructive hypertrophic cardiomyopathy: Secondary | ICD-10-CM | POA: Diagnosis not present

## 2017-08-01 NOTE — Telephone Encounter (Signed)
I dont   Order humidifiers

## 2017-08-01 NOTE — Telephone Encounter (Signed)
LM x 1 

## 2017-08-01 NOTE — Telephone Encounter (Signed)
Pt is requesting an order be sent to [facility] for a humidifier. Please advise Dr Fabian Sharp, thanks.

## 2017-08-01 NOTE — Patient Instructions (Addendum)
Medication Instructions:  Your physician recommends that you continue on your current medications as directed. Please refer to the Current Medication list given to you today.  Labwork: NONE  Testing/Procedures: Your physician has requested that you have an echocardiogram in 1 year. Echocardiography is a painless test that uses sound waves to create images of your heart. It provides your doctor with information about the size and shape of your heart and how well your heart's chambers and valves are working. This procedure takes approximately one hour. There are no restrictions for this procedure.  Follow-Up: Your physician wants you to follow-up in: 12 months with Dr. Nishan. You will receive a reminder letter in the mail two months in advance. If you don't receive a letter, please call our office to schedule the follow-up appointment.   If you need a refill on your cardiac medications before your next appointment, please call your pharmacy.    

## 2017-08-01 NOTE — Patient Instructions (Signed)
Pre-Operative Instructions  Congratulations, you have decided to take an important step towards improving your quality of life.  You can be assured that the doctors and staff at [facility] will be with you every step of the way.  Here are some important things you should know:  1. Plan to be at the surgery center/hospital at least 1 (one) hour prior to your scheduled time, unless otherwise directed by the surgical center/hospital staff.  You must have a responsible adult accompany you, remain during the surgery and drive you home.  Make sure you have directions to the surgical center/hospital to ensure you arrive on time. 2. If you are having surgery at [facility] or [facility], you will need a copy of your medical history and physical form from your family physician within one month prior to the date of surgery. We will give you a form for your primary physician to complete.  3. We make every effort to accommodate the date you request for surgery.  However, there are times where surgery dates or times have to be moved.  We will contact you as soon as possible if a change in schedule is required.   4. No aspirin/ibuprofen for one week before surgery.  If you are on aspirin, any non-steroidal anti-inflammatory medications (Mobic, Aleve, Ibuprofen) should not be taken seven (7) days prior to your surgery.  You make take Tylenol for pain prior to surgery.  5. Medications - If you are taking daily heart and blood pressure medications, seizure, reflux, allergy, asthma, anxiety, pain or diabetes medications, make sure you notify the surgery center/hospital before the day of surgery so they can tell you which medications you should take or avoid the day of surgery. 6. No food or drink after midnight the night before surgery unless directed otherwise by surgical center/hospital staff. 7. No alcoholic beverages 24-hours prior to surgery.  No smoking 24-hours prior or 24-hours after  surgery. 8. Wear loose pants or shorts. They should be loose enough to fit over bandages, boots, and casts. 9. Don't wear slip-on shoes. Sneakers are preferred. 10. Bring your boot with you to the surgery center/hospital.  Also bring crutches or a walker if your physician has prescribed it for you.  If you do not have this equipment, it will be provided for you after surgery. 11. If you have not been contacted by the surgery center/hospital by the day before your surgery, call to confirm the date and time of your surgery. 12. Leave-time from work may vary depending on the type of surgery you have.  Appropriate arrangements should be made prior to surgery with your employer. 13. Prescriptions will be provided immediately following surgery by your doctor.  Fill these as soon as possible after surgery and take the medication as directed. Pain medications will not be refilled on weekends and must be approved by the doctor. 14. Remove nail polish on the operative foot and avoid getting pedicures prior to surgery. 15. Wash the night before surgery.  The night before surgery wash the foot and leg well with water and the antibacterial soap provided. Be sure to pay special attention to beneath the toenails and in between the toes.  Wash for at least three (3) minutes. Rinse thoroughly with water and dry well with a towel.  Perform this wash unless told not to do so by your physician.  Enclosed: 1 Ice pack (please put in freezer the night before surgery)   1 Hibiclens skin cleaner     Pre-op instructions  If you have any questions regarding the instructions, please do not hesitate to call our office.  [place]: [address], [place], [place] [address] -- [phone]  [place]: [address] [place], [place] [address] -- [phone]  [place]: [address]  , [place] [address] -- [phone]  [place]: [address], [address], [place], [place] [address] -- [phone]  Website:  [email or web address] 

## 2017-08-02 NOTE — Progress Notes (Signed)
  Subjective:  [name], female    DOB: Mar 13, 1951,  MRN: [id number]  Chief Complaint  Patient presents with  . Foot Problem    i want to talk about having surgery on my right foot and was referred by Dr Amalia Hailey   66 y.o. female returns for the above complaint. Patient here for surgical consult for her right foot second hammertoe. Has a history of bunion correction of both feet several years ago, unsure of Psychologist, sport and exercise. Reports pain with her second toe; has tried padding shoe gear change without relief. Referred by Dr. Amalia Hailey for evaluation. Patient also reports pain from the callus in the bottom of her right foot and pain with the return with her bunion deformity on her right foot.  Objective:   Vitals:   08/01/17 1544  BP: 121/65  Pulse: 98  Resp: 18   General AA&O x3. Normal mood and affect.  Vascular Dorsalis pedis and posterior tibial pulses  present 2+ bilaterally  Capillary refill normal to all digits. Pedal hair growth normal.  Neurologic Epicritic sensation grossly present.  Dermatologic No open lesions. Interspaces clear of maceration. Nails well groomed and normal in appearance. Large sub-met 2 and 5 hyperkeratoses with pain to palpation   Orthopedic: MMT 5/5 in dorsiflexion, plantarflexion, inversion, and eversion. Normal joint ROM without pain or crepitus. Residual HAV deformity bilaterally, right greater than left with bossing of the second digit by the right hallux. Right second toe rigid hammertoe  Pain palpation right sub-met 2 and sub-met 5 Pain and limited range of motion of the right first metatarsophalangeal joint with joint crepitus.  Pain to palpation, prominence about the proximal first metatarsal    Assessment & Plan:  Patient was evaluated and treated and all questions answered.  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 Patient verbalized understanding. We'll plan for correction of the bunion likely via first metatarsophalangeal joint fusion given that she has pain and decreased range of motion on exam. That should allow correction of the second hammertoe. While osteotomy of second metatarsal be performed in order to decrease the second MPJ capsule pain and alleviate the callus formation under her second metatarsal. We'll perform condylectomy of the fifth metatarsal to assist in alleviating the fifth metatarsal capsulitis and callus formation. -Post-operative course explained at length. -Consent signed and reviewed with patient.  20 minutes of face to face time were spent with the patient. >50% of this was spent on counseling and coordination of care. Specifically discussed with patient the above surgical plan. All questions answered at length. Patient wishes to proceed with surgical plan.   Return for post-operative check.

## 2017-08-02 NOTE — Telephone Encounter (Signed)
LM for patient to make aware.

## 2017-08-02 NOTE — Telephone Encounter (Signed)
Pt aware. Nothing further needed 

## 2017-08-07 ENCOUNTER — Ambulatory Visit (INDEPENDENT_AMBULATORY_CARE_PROVIDER_SITE_OTHER): Payer: Medicare HMO | Admitting: Internal Medicine

## 2017-08-07 ENCOUNTER — Encounter: Payer: Self-pay | Admitting: Internal Medicine

## 2017-08-07 VITALS — BP 108/62 | HR 95 | Temp 98.0°F | Ht 62.25 in | Wt 150.3 lb

## 2017-08-07 DIAGNOSIS — I10 Essential (primary) hypertension: Secondary | ICD-10-CM

## 2017-08-07 DIAGNOSIS — R1110 Vomiting, unspecified: Secondary | ICD-10-CM | POA: Diagnosis not present

## 2017-08-07 DIAGNOSIS — Z6379 Other stressful life events affecting family and household: Secondary | ICD-10-CM | POA: Diagnosis not present

## 2017-08-07 DIAGNOSIS — Z801 Family history of malignant neoplasm of trachea, bronchus and lung: Secondary | ICD-10-CM | POA: Diagnosis not present

## 2017-08-07 DIAGNOSIS — R634 Abnormal weight loss: Secondary | ICD-10-CM

## 2017-08-07 DIAGNOSIS — N289 Disorder of kidney and ureter, unspecified: Secondary | ICD-10-CM | POA: Diagnosis not present

## 2017-08-07 DIAGNOSIS — Z0000 Encounter for general adult medical examination without abnormal findings: Secondary | ICD-10-CM | POA: Diagnosis not present

## 2017-08-07 DIAGNOSIS — E785 Hyperlipidemia, unspecified: Secondary | ICD-10-CM

## 2017-08-07 DIAGNOSIS — E1122 Type 2 diabetes mellitus with diabetic chronic kidney disease: Secondary | ICD-10-CM | POA: Diagnosis not present

## 2017-08-07 DIAGNOSIS — R0989 Other specified symptoms and signs involving the circulatory and respiratory systems: Secondary | ICD-10-CM | POA: Diagnosis not present

## 2017-08-07 LAB — POC URINALSYSI DIPSTICK (AUTOMATED)
Bilirubin, UA: NEGATIVE
Blood, UA: NEGATIVE
Clarity, UA: NEGATIVE
Glucose, UA: NEGATIVE
Ketones, UA: NEGATIVE
Leukocytes, UA: NEGATIVE
Nitrite, UA: NEGATIVE
Spec Grav, UA: 1.02 (ref 1.010–1.025)
Urobilinogen, UA: 1 E.U./dL
pH, UA: 6 (ref 5.0–8.0)

## 2017-08-07 MED ORDER — ZOSTER VAC RECOMB ADJUVANTED 50 MCG/0.5ML IM SUSR: 0.5000 mL | Freq: Once | INTRAMUSCULAR | 1 refills | Status: AC
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
## 2017-08-07 NOTE — Patient Instructions (Signed)
I suggest you delay the elective foot surgery cousins of the symptoms you are having and the fact that your daughter has metastatic cancer. Vomiting is never normal although it could be from your reflux. We'll make a referral to pulmonary because of the mucus symptoms your defining that is affecting her breathing.  I agree that the weight loss could be from stress as you have had this happen to before under caretaker situation. Agree with talking with the counselor resources through the cancer center or elsewhere. Planning lab work today will let you know those results. We'll also send a copy of our evaluation to Dr. Collene Mares .     In the future you can get the new shingles vaccine at a pharmacy I will give you a prescription today. I would like you to have a follow-up visit with me in a month and after you see pulmonary specialist.    Preventive Care 65 Years and Older, Female Preventive care refers to lifestyle choices and visits with your health care provider that can promote health and wellness. What does preventive care include?  A yearly physical exam. This is also called an annual well check.  Dental exams once or twice a year.  Routine eye exams. Ask your health care provider how often you should have your eyes checked.  Personal lifestyle choices, including: ? Daily care of your teeth and gums. ? Regular physical activity. ? Eating a healthy diet. ? Avoiding tobacco and drug use. ? Limiting alcohol use. ? Practicing safe sex. ? Taking low-dose aspirin every day. ? Taking vitamin and mineral supplements as recommended by your health care provider. What happens during an annual well check? The services and screenings done by your health care provider during your annual well check will depend on your age, overall health, lifestyle risk factors, and family history of disease. Counseling Your health care provider may ask you questions about your:  Alcohol use.  Tobacco  use.  Drug use.  Emotional well-being.  Home and relationship well-being.  Sexual activity.  Eating habits.  History of falls.  Memory and ability to understand (cognition).  Work and work Statistician.  Reproductive health.  Screening You may have the following tests or measurements:  Height, weight, and BMI.  Blood pressure.  Lipid and cholesterol levels. These may be checked every 5 years, or more frequently if you are over 44 years old.  Skin check.  Lung cancer screening. You may have this screening every year starting at age 49 if you have a 30-pack-year history of smoking and currently smoke or have quit within the past 15 years.  Fecal occult blood test (FOBT) of the stool. You may have this test every year starting at age 57.  Flexible sigmoidoscopy or colonoscopy. You may have a sigmoidoscopy every 5 years or a colonoscopy every 10 years starting at age 28.  Hepatitis C blood test.  Hepatitis B blood test.  Sexually transmitted disease (STD) testing.  Diabetes screening. This is done by checking your blood sugar (glucose) after you have not eaten for a while (fasting). You may have this done every 1-3 years.  Bone density scan. This is done to screen for osteoporosis. You may have this done starting at age 16.  Mammogram. This may be done every 1-2 years. Talk to your health care provider about how often you should have regular mammograms.  Talk with your health care provider about your test results, treatment options, and if necessary, the need for more  tests. Vaccines Your health care provider may recommend certain vaccines, such as:  Influenza vaccine. This is recommended every year.  Tetanus, diphtheria, and acellular pertussis (Tdap, Td) vaccine. You may need a Td booster every 10 years.  Varicella vaccine. You may need this if you have not been vaccinated.  Zoster vaccine. You may need this after age 55.  Measles, mumps, and rubella (MMR)  vaccine. You may need at least one dose of MMR if you were born in 1957 or later. You may also need a second dose.  Pneumococcal 13-valent conjugate (PCV13) vaccine. One dose is recommended after age 16.  Pneumococcal polysaccharide (PPSV23) vaccine. One dose is recommended after age 70.  Meningococcal vaccine. You may need this if you have certain conditions.  Hepatitis A vaccine. You may need this if you have certain conditions or if you travel or work in places where you may be exposed to hepatitis A.  Hepatitis B vaccine. You may need this if you have certain conditions or if you travel or work in places where you may be exposed to hepatitis B.  Haemophilus influenzae type b (Hib) vaccine. You may need this if you have certain conditions.  Talk to your health care provider about which screenings and vaccines you need and how often you need them. This information is not intended to replace advice given to you by your health care provider. Make sure you discuss any questions you have with your health care provider. Document Released: 11/05/2015 Document Revised: 06/28/2016 Document Reviewed: 08/10/2015 Elsevier Interactive Patient Education  2017 [employer].

## 2017-08-08 ENCOUNTER — Telehealth: Payer: Self-pay | Admitting: *Deleted

## 2017-08-08 LAB — HEPATIC FUNCTION PANEL
ALT: 15 U/L (ref 0–35)
AST: 24 U/L (ref 0–37)
Albumin: 4.2 g/dL (ref 3.5–5.2)
Alkaline Phosphatase: 72 U/L (ref 39–117)
Bilirubin, Direct: 0.1 mg/dL (ref 0.0–0.3)
Total Bilirubin: 0.5 mg/dL (ref 0.2–1.2)
Total Protein: 7.2 g/dL (ref 6.0–8.3)

## 2017-08-08 LAB — BASIC METABOLIC PANEL
BUN: 16 mg/dL (ref 6–23)
CO2: 27 mEq/L (ref 19–32)
Calcium: 9.7 mg/dL (ref 8.4–10.5)
Chloride: 104 mEq/L (ref 96–112)
Creatinine, Ser: 1.48 mg/dL — ABNORMAL HIGH (ref 0.40–1.20)
GFR: 45.47 mL/min — ABNORMAL LOW (ref >60.00)
Glucose, Bld: 82 mg/dL (ref 70–99)
Potassium: 4.4 mEq/L (ref 3.5–5.1)
Sodium: 141 mEq/L (ref 135–145)

## 2017-08-08 LAB — LIPID PANEL
Cholesterol: 168 mg/dL (ref 0–200)
HDL: 64.5 mg/dL (ref >39.00)
LDL Cholesterol: 89 mg/dL (ref 0–99)
NonHDL: 103.65
Total CHOL/HDL Ratio: 3
Triglycerides: 74 mg/dL (ref 0.0–149.0)
VLDL: 14.8 mg/dL (ref 0.0–40.0)

## 2017-08-08 LAB — CBC WITH DIFFERENTIAL/PLATELET
Basophils Absolute: 0.1 10*3/uL (ref 0.0–0.1)
Basophils Relative: 1.2 % (ref 0.0–3.0)
Eosinophils Absolute: 0.2 10*3/uL (ref 0.0–0.7)
Eosinophils Relative: 2.6 % (ref 0.0–5.0)
HCT: 41 % (ref 36.0–46.0)
Hemoglobin: 13.3 g/dL (ref 12.0–15.0)
Lymphocytes Relative: 21.2 % (ref 12.0–46.0)
Lymphs Abs: 1.9 10*3/uL (ref 0.7–4.0)
MCHC: 32.3 g/dL (ref 30.0–36.0)
MCV: 92.8 fl (ref 78.0–100.0)
Monocytes Absolute: 0.6 10*3/uL (ref 0.1–1.0)
Monocytes Relative: 6.9 % (ref 3.0–12.0)
Neutro Abs: 6 10*3/uL (ref 1.4–7.7)
Neutrophils Relative %: 68.1 % (ref 43.0–77.0)
Platelets: 341 10*3/uL (ref 150.0–400.0)
RBC: 4.42 Mil/uL (ref 3.87–5.11)
RDW: 14.9 % (ref 11.5–15.5)
WBC: 8.8 10*3/uL (ref 4.0–10.5)

## 2017-08-08 LAB — HEMOGLOBIN A1C: Hgb A1c MFr Bld: 6.3 % (ref 4.6–6.5)

## 2017-08-08 LAB — T4, FREE: Free T4: 0.73 ng/dL (ref 0.60–1.60)

## 2017-08-08 LAB — TSH: TSH: 1.54 u[IU]/mL (ref 0.35–4.50)

## 2017-08-08 NOTE — Telephone Encounter (Signed)
"  I scheduled surgery for 08/15/2017.  I need to cancel that surgery.  I'll call back later to reschedule it."  Is there a particular reason that I can tell Dr. Samuella Cota regarding the cancellation?  "Yes, my doctor wants me to hold off for some personal health issues and my daughter was diagnosed with cancer, the kind that spreads."  I will let Dr. Samuella Cota know and I will cancel it with the surgery center.

## 2017-08-13 DIAGNOSIS — F314 Bipolar disorder, current episode depressed, severe, without psychotic features: Secondary | ICD-10-CM | POA: Diagnosis not present

## 2017-08-14 ENCOUNTER — Telehealth: Payer: Self-pay | Admitting: Internal Medicine

## 2017-08-14 NOTE — Telephone Encounter (Signed)
Productive cough x 1 week - worse at night Pt states that mucus is clear, thick. Denies Fever -- pt has not taken temp Pt has had some sweating and feeling of being hot No OTC meds tried at this point. Pt states that at times since her mucus is so thick she will experience some chest pain when getting mucus up.  Some sneezing.   Any rec's of OTC cough meds to try. Please advise Dr Fabian Sharp, thanks.

## 2017-08-14 NOTE — Telephone Encounter (Signed)
Rec's given. Nothing further needed.

## 2017-08-14 NOTE — Telephone Encounter (Signed)
Can try plain mucinex generic or other .to loosen mucous.   Hot tea and honey as good as cough meds . At night

## 2017-08-14 NOTE — Telephone Encounter (Signed)
Pt wants to know if she can take any kind of OTC cough medicine with all the other meds she is on. Pt states she has a productive cough.

## 2017-08-14 NOTE — Telephone Encounter (Signed)
ATC x 3, phone line was disconnected while talking and was unable to reconnect with the patient. Unable to leave a voicemail.  Need for information about symptoms... How long? What color mucus coughing up? Fever? Any OTC meds tried for symptoms? Any other symptoms?

## 2017-08-22 ENCOUNTER — Ambulatory Visit (INDEPENDENT_AMBULATORY_CARE_PROVIDER_SITE_OTHER)
Admission: RE | Admit: 2017-08-22 | Discharge: 2017-08-22 | Disposition: A | Discharge status: Home / Self Care | Payer: Medicare HMO | Source: Ambulatory Visit | Attending: Internal Medicine | Admitting: Internal Medicine

## 2017-08-22 ENCOUNTER — Telehealth: Payer: Self-pay | Admitting: Internal Medicine

## 2017-08-22 DIAGNOSIS — R05 Cough: Secondary | ICD-10-CM | POA: Diagnosis not present

## 2017-08-22 DIAGNOSIS — R059 Cough, unspecified: Secondary | ICD-10-CM

## 2017-08-22 NOTE — Telephone Encounter (Signed)
Spoke with Tammy @ [facility] and pt has been scheduled for 08/29/17 with Dr. Kendrick Fries.  Dr. Fabian Sharp - FYI. Thanks!

## 2017-08-22 NOTE — Telephone Encounter (Signed)
I called [facility] spoke with a scheduler who informed me that they have been behind on their  referrals , and that they will contact this pt to schedule .

## 2017-08-22 NOTE — Telephone Encounter (Signed)
Pt c/o chest discomfort/pain with congestion and green mucus at times. Pt states that she threw up last night from severe coughing and it was a lot of mucus  A lot of coughing. Sinus congestion.  Denies SOB.  Low grade fever 99.8 Using OTC Robitussin for sx's   Please advise Dr Fabian Sharp, thanks.

## 2017-08-22 NOTE — Telephone Encounter (Signed)
Get chest x ray  Dx  Prolonged coughing and chest  Sx  Then plan depending on results   ( pulmonary referral is pending  Please check on this)

## 2017-08-22 NOTE — Telephone Encounter (Signed)
Pt aware.  States that they have already contacted her today to schedule this.  Pt is currently getting CXR  Will send to Dr Fabian Sharp as Lorain Childes.

## 2017-08-22 NOTE — Telephone Encounter (Signed)
Pt aware of rec's per Dr Fabian Sharp.  Order placed for cxr stat  Referral to Pulmonary placed 08/07/17 Will send to Shelbie Proctor to see if she can assist in getting the patient scheduled as she has not heard from anyone at Pulmonary regarding scheduling.

## 2017-08-29 ENCOUNTER — Institutional Professional Consult (permissible substitution): Payer: Medicare HMO | Admitting: Pulmonary Disease

## 2017-08-29 ENCOUNTER — Encounter: Payer: Self-pay | Admitting: Pulmonary Disease

## 2017-08-29 ENCOUNTER — Ambulatory Visit: Payer: Medicare HMO | Admitting: Pulmonary Disease

## 2017-08-29 VITALS — BP 128/78 | Ht 62.0 in | Wt 148.0 lb

## 2017-08-29 DIAGNOSIS — J432 Centrilobular emphysema: Secondary | ICD-10-CM

## 2017-08-29 DIAGNOSIS — R05 Cough: Secondary | ICD-10-CM

## 2017-08-29 DIAGNOSIS — J301 Allergic rhinitis due to pollen: Secondary | ICD-10-CM

## 2017-08-29 DIAGNOSIS — R0600 Dyspnea, unspecified: Secondary | ICD-10-CM | POA: Diagnosis not present

## 2017-08-29 DIAGNOSIS — R059 Cough, unspecified: Secondary | ICD-10-CM

## 2017-08-29 MED ORDER — UMECLIDINIUM-VILANTEROL 62.5-25 MCG/INH IN AEPB: 1.0000 | INHALATION_SPRAY | Freq: Every day | RESPIRATORY_TRACT | 0 refills | Status: DC

## 2017-08-29 MED ORDER — MONTELUKAST SODIUM 10 MG PO TABS: 10.0000 mg | ORAL_TABLET | Freq: Every day | ORAL | 11 refills | Status: DC
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
## 2017-08-29 NOTE — Patient Instructions (Signed)
Cough: I think this is exacerbated by your allergic rhinitis In addition to Zyrtec and Flonase taken every day I am going to give you a prescription for Singulair to take daily Exhaled NO test today  Centrilobular emphysema seen on chest x-ray in the setting of years of tobacco use: I believe you have COPD Take Anoro 1 puff daily no matter how you feel Spirometry test today  Allergic rhinitis: Continue taking Zyrtec and Flonase New prescription for Singulair daily  Follow-up with [place] in 3-4 weeks to see how you are doing with the new inhaler

## 2017-09-05 ENCOUNTER — Telehealth: Payer: Self-pay | Admitting: Pulmonary Disease

## 2017-09-05 MED ORDER — PREDNISONE 20 MG PO TABS: 20.0000 mg | ORAL_TABLET | Freq: Every day | ORAL | 0 refills | Status: DC

## 2017-09-05 NOTE — Telephone Encounter (Signed)
Prednisone 20 mg daily times 5 days

## 2017-09-05 NOTE — Telephone Encounter (Signed)
After patient advised of her symptoms I offered to schedule her an appointment to be seen before 11/30 follow up and she stated she wanted to speak with the nurse first. She states she has also used her inhaler before (she states BQ asked her this but she couldn't remember).  Pharm is [facility] on [address].

## 2017-09-05 NOTE — Telephone Encounter (Signed)
Spoke with pt, who reports of prod cough with white mucus-cough worsens at night, night sweats, chest discomfort, dizziness, temp of 99.4 x2-3wk.  Pt is wanting to know if it is okay to be around her daughter who has cancer.   BQ please advise. Thanks.

## 2017-09-05 NOTE — Telephone Encounter (Signed)
Spoke with the pt and notified of recs per BQ  She verbalized understanding  Rx was sent to pharm   

## 2017-09-17 DIAGNOSIS — N760 Acute vaginitis: Secondary | ICD-10-CM | POA: Diagnosis not present

## 2017-09-18 ENCOUNTER — Telehealth: Payer: Self-pay | Admitting: Pulmonary Disease

## 2017-09-18 NOTE — Telephone Encounter (Signed)
Left message for pt to call us back x1 

## 2017-09-19 MED ORDER — PREDNISONE 20 MG PO TABS: 20.0000 mg | ORAL_TABLET | Freq: Every day | ORAL | 0 refills | Status: DC

## 2017-09-19 NOTE — Telephone Encounter (Signed)
Patient returning call, CB is [phone].

## 2017-09-19 NOTE — Telephone Encounter (Signed)
Prednisone 20mg daily x 5 days  Call if no improvement 

## 2017-09-19 NOTE — Telephone Encounter (Signed)
Spoke with pt, she would like something for cough and mucus. It happens during the day and worse at night. The mucus is clear and she is requesting something for the cough and congestion in her chest. She states she is doing everything BQ recommended in her last OV. Please advise.   [facility] on [address]       Patient Instructions by Lupita Leash, MD at 08/29/2017 2:30 PM   Author: Lupita Leash, MD Author Type: Physician Filed: 08/29/2017 3:04 PM  Note Status: Signed Cosign: Cosign Not Required Encounter Date: 08/29/2017  Editor: Lupita Leash, MD (Physician)    Cough: I think this is exacerbated by your allergic rhinitis In addition to Zyrtec and Flonase taken every day I am going to give you a prescription for Singulair to take daily Exhaled NO test today

## 2017-09-19 NOTE — Telephone Encounter (Signed)
Called pt letting her know I was sending in an Rx pred 20mg  to her pharmacy.  Pt expressed understanding. Nothing furhter needed.

## 2017-09-21 ENCOUNTER — Ambulatory Visit: Payer: Medicare HMO | Admitting: Pulmonary Disease

## 2017-09-21 ENCOUNTER — Encounter: Payer: Self-pay | Admitting: Pulmonary Disease

## 2017-09-21 VITALS — BP 118/76 | HR 95 | Ht 62.0 in | Wt 152.0 lb

## 2017-09-21 DIAGNOSIS — R0600 Dyspnea, unspecified: Secondary | ICD-10-CM

## 2017-09-21 DIAGNOSIS — R0609 Other forms of dyspnea: Secondary | ICD-10-CM | POA: Diagnosis not present

## 2017-09-21 DIAGNOSIS — R059 Cough, unspecified: Secondary | ICD-10-CM

## 2017-09-21 DIAGNOSIS — J301 Allergic rhinitis due to pollen: Secondary | ICD-10-CM | POA: Diagnosis not present

## 2017-09-21 DIAGNOSIS — R05 Cough: Secondary | ICD-10-CM | POA: Diagnosis not present

## 2017-09-21 MED ORDER — UMECLIDINIUM-VILANTEROL 62.5-25 MCG/INH IN AEPB: 1.0000 | INHALATION_SPRAY | Freq: Every day | RESPIRATORY_TRACT | 0 refills | Status: DC

## 2017-09-21 NOTE — Addendum Note (Signed)
Addended by: Cornell Barman on: 09/21/2017 03:31 PM   Modules accepted: Orders

## 2017-09-21 NOTE — Addendum Note (Signed)
Addended by: Cornell Barman on: 09/21/2017 02:29 PM   Modules accepted: Orders

## 2017-09-21 NOTE — Progress Notes (Signed)
Subjective:    [name], female    DOB: 01-09-1951, 66 y.o.   MRN: [id number]  Synopsis: Referred in 2018 for evaluation of cough. Smoked 2 PP for 28 years, quit 2003 Has GERD and hypertrophic cardiomyopathy.   Chief Complaint  Patient presents with  . Follow-up    Pt has productive cough, waking up each night all throughout the night coughing up clear to yellow thick mucus. Pt has chest tightness with SOB with exertion.    Desiree [name] says that she still has chest tightness and cough.  She is still producing mucus at night.  She says that it is typically thick and white.  No recent fever.  Anoro helped with the cough and chest tightness. No bronchitis since the last visit.  No recenet pneumonia. She still has some shortness of breath, no difference with the Anoro. She denies heartburn or indigestion.  Past Medical History:  Diagnosis Date  . Anxiety   . Bipolar disorder ([place])   . CANDIDIASIS, ESOPHAGEAL 07/28/2009   Qualifier: Diagnosis of  By: [name] MD, [name]   . Chronic kidney disease    CKD III  . COPD (chronic obstructive pulmonary disease) ([place])   . Depression   . Diabetes mellitus without complication (HCC)    no meds  . FH: colonic polyps   . Fractured elbow    right   . GERD (gastroesophageal reflux disease)   . HH (hiatus hernia)   . History of carpal tunnel syndrome   . History of chest pain   . History of transfusion of packed red blood cells   . Hyperlipidemia   . Hypertension   . Neuroleptic-induced tardive dyskinesia   . Osteoarthritis of more than one site   . Seasonal allergies       Review of Systems  Constitutional: Negative for fever and unexpected weight change.  HENT: Positive for congestion. Negative for dental problem, ear pain, nosebleeds, postnasal drip, rhinorrhea, sinus pressure, sneezing, sore throat and trouble swallowing.   Respiratory: Positive for cough, chest tightness and shortness of breath. Negative for wheezing.     Cardiovascular: Negative for palpitations and leg swelling.  Gastrointestinal: Negative for vomiting.  Skin: Negative for rash.       Objective:   Physical Exam  Vitals:   09/21/17 1359  BP: 118/76  Pulse: 95  SpO2: 93%  Weight: 152 lb (68.9 kg)  Height: 5' 2"  (1.575 m)    Gen: well appearing HENT: OP clear, TM's clear, neck supple PULM: Wheeze heard anterior/right chest B, normal percussion CV: RRR, no mgr, trace edema GI: BS+, soft, nontender Derm: no cyanosis or rash Psyche: normal mood and affect   CBC    Component Value Date/Time   WBC 8.8 08/07/2017 1527   RBC 4.42 08/07/2017 1527   HGB 13.3 08/07/2017 1527   HCT 41.0 08/07/2017 1527   PLT 341.0 08/07/2017 1527   MCV 92.8 08/07/2017 1527   MCH 29.2 12/05/2016 1747   MCHC 32.3 08/07/2017 1527   RDW 14.9 08/07/2017 1527   LYMPHSABS 1.9 08/07/2017 1527   MONOABS 0.6 08/07/2017 1527   EOSABS 0.2 08/07/2017 1527   BASOSABS 0.1 08/07/2017 1527      Chest x-ray: August 22, 2017 images independently reviewed showing normal pulmonary lung fields.   Echocardiogram: June 27, 2017 transthoracic US echocardiogram LVEF 65-70%, severe asymmetric septal hypertrophy grade 1 diastolic dysfunction, right ventricle cavity size normal, systolic function normal  PFT: November 2018 spirometry normal  Assessment & Plan:    Dyspnea, unspecified type  Cough  Allergic rhinitis due to pollen, unspecified seasonality  Discussion: Desiree [name] unfortunately is still having symptoms of chest congestion, tightness, and shortness of breath.  I thought that she would benefit from Anoro because I am strongly suspicious that she has COPD or emphysema despite the normal spirometry test.  Because she still has symptoms of like for her to undergo further testing.  I will arrange for a lung function test and a CT scan of the chest to look for underlying interstitial lung disease or more likely centrilobular emphysema not seen on  chest x-ray.  She also has persistent allergic rhinitis despite treatment with a nasal steroid and antihistamine.  We will send blood work today to look for evidence of an ongoing infection.  She states that she is compliant with her antacid therapy.  Plan: Cough with chest tightness and shortness of breath: I still think your cough and chest tightness are related to emphysema. We will arrange for a lung function tests and a CT scan to investigate this further Keep taking Anoro, will give you another sample today  Postnasal drip sinus congestion: We will check a blood test today called a serum IgE and allergy panel to try to get to the bottom of this Use Milta Deiters Med rinses with distilled water at least twice per day using the instructions on the package. 1/2 hour after using the [facility] Med rinse, use Nasacort two puffs in each nostril once per day.  Remember that the Nasacort can take 1-2 weeks to work after regular use. Use generic zyrtec (cetirizine) every day.  If this doesn't help, then stop taking it and use chlorpheniramine-phenylephrine combination tablets.    We will see you back in 1-2 weeks to go over these results, nurse practitioner visit okay    Current Outpatient Medications:  .  acetaminophen (TYLENOL) 500 MG tablet, Take 2 tablets (1,000 mg total) by mouth every 6 (six) hours as needed. (Patient taking differently: Take 1,000 mg by mouth every 6 (six) hours as needed (for pain/headache.). ), Disp: 30 tablet, Rfl: 0 .  atorvastatin (LIPITOR) 20 MG tablet, take 1 tablet by mouth once daily, Disp: 90 tablet, Rfl: 0 .  Blood Glucose Monitoring Suppl (ACCU-CHEK NANO SMARTVIEW) W/DEVICE KIT, Use to check blood sugar 1-2 times daily (Patient taking differently: 1 strip by Other route 2 (two) times a week. ), Disp: 1 kit, Rfl: 0 .  cetirizine (ZYRTEC) 10 MG tablet, Take 10 mg by mouth at bedtime. , Disp: , Rfl:  .  docusate sodium (COLACE) 100 MG capsule, Take 100 mg by mouth at  bedtime. , Disp: , Rfl:  .  DULoxetine (CYMBALTA) 60 MG capsule, Take 1 capsule (60 mg total) by mouth daily., Disp: 30 capsule, Rfl: 0 .  fluticasone (FLONASE) 50 MCG/ACT nasal spray, Place 1-2 sprays into both nostrils at bedtime. , Disp: , Rfl:  .  glucose blood test strip, Accu-chek nano, test glucose once daily, dx E11.9, Disp: 100 each, Rfl: 12 .  lamoTRIgine (LAMICTAL) 25 MG tablet, Take 1 tablet (25 mg total) by mouth 2 (two) times daily., Disp: 14 tablet, Rfl: 0 .  Lancets Misc. (ACCU-CHEK FASTCLIX LANCET) KIT, USE TO CHECK BLOOD SUGAR 1-2 TIMES DAILY, Disp: 1 kit, Rfl: 0 .  losartan (COZAAR) 25 MG tablet, take 1 tablet by mouth once daily, Disp: 90 tablet, Rfl: 1 .  montelukast (SINGULAIR) 10 MG tablet, Take 1 tablet (10 mg total) at  bedtime by mouth., Disp: 30 tablet, Rfl: 11 .  Multiple Vitamins-Minerals (CENTRUM SILVER PO), Take 1 tablet by mouth daily. , Disp: , Rfl:  .  ondansetron (ZOFRAN-ODT) 4 MG disintegrating tablet, Take 1 tablet (4 mg total) by mouth every 8 (eight) hours as needed for nausea or vomiting., Disp: 20 tablet, Rfl: 0 .  OVER THE COUNTER MEDICATION, Take 1 capsule by mouth every morning. IBgard & Fgard, Disp: , Rfl:  .  pantoprazole (PROTONIX) 40 MG tablet, Take 40 mg by mouth daily., Disp: , Rfl:  .  predniSONE (DELTASONE) 20 MG tablet, Take 1 tablet (20 mg total) by mouth daily with breakfast., Disp: 5 tablet, Rfl: 0 .  Probiotic Product (ALIGN PO), Take 1 capsule by mouth at bedtime. Align, Disp: , Rfl:  .  umeclidinium-vilanterol (ANORO ELLIPTA) 62.5-25 MCG/INH AEPB, Inhale 1 puff daily into the lungs., Disp: 2 each, Rfl: 0

## 2017-09-21 NOTE — Patient Instructions (Signed)
Cough with chest tightness and shortness of breath: I still think your cough and chest tightness are related to emphysema. We will arrange for a lung function tests and a CT scan to investigate this further Keep taking Anoro, will give you another sample today  Postnasal drip sinus congestion: We will check a blood test today called a serum IgE and allergy panel to try to get to the bottom of this Use Lloyd Huger Med rinses with distilled water at least twice per day using the instructions on the package. 1/2 hour after using the [facility] Med rinse, use Nasacort two puffs in each nostril once per day.  Remember that the Nasacort can take 1-2 weeks to work after regular use. Use generic zyrtec (cetirizine) every day.  If this doesn't help, then stop taking it and use chlorpheniramine-phenylephrine combination tablets.    We will see you back in 1-2 weeks to go over these results, nurse practitioner visit okay

## 2017-09-24 ENCOUNTER — Telehealth: Payer: Self-pay | Admitting: Internal Medicine

## 2017-09-24 ENCOUNTER — Other Ambulatory Visit: Payer: Medicare HMO

## 2017-09-24 DIAGNOSIS — J301 Allergic rhinitis due to pollen: Secondary | ICD-10-CM

## 2017-09-24 NOTE — Telephone Encounter (Signed)
Spoke with the pt  She had questions about neil med rinse  I advised her that she can get this otc  She verbalized understanding  She states that she forgot to go to the lab 11/30 so she is coming to have labs done today  Nothing further needed

## 2017-09-25 ENCOUNTER — Ambulatory Visit (INDEPENDENT_AMBULATORY_CARE_PROVIDER_SITE_OTHER): Payer: Medicare HMO | Admitting: Orthopaedic Surgery

## 2017-09-25 ENCOUNTER — Encounter (INDEPENDENT_AMBULATORY_CARE_PROVIDER_SITE_OTHER): Payer: Self-pay | Admitting: Orthopaedic Surgery

## 2017-09-25 ENCOUNTER — Telehealth: Payer: Self-pay | Admitting: Internal Medicine

## 2017-09-25 VITALS — BP 128/73 | HR 90 | Ht 62.0 in | Wt 152.0 lb

## 2017-09-25 DIAGNOSIS — M2550 Pain in unspecified joint: Secondary | ICD-10-CM | POA: Diagnosis not present

## 2017-09-25 LAB — RESPIRATORY ALLERGY PROFILE REGION II ~~LOC~~
Allergen, A. alternata, m6: <0.1 kU/L
Allergen, Cedar tree, t12: <0.1 kU/L
Allergen, Comm Silver Birch, t9: <0.1 kU/L
Allergen, Cottonwood, t14: <0.1 kU/L
Allergen, D pternoyssinus,d7: <0.1 kU/L
Allergen, Mouse Urine Protein, e78: <0.1 kU/L
Allergen, Mulberry, t76: <0.1 kU/L
Allergen, Oak,t7: <0.1 kU/L
Allergen, P. notatum, m1: <0.1 kU/L
Aspergillus fumigatus, m3: <0.1 kU/L
Bermuda Grass: <0.1 kU/L
Box Elder IgE: <0.1 kU/L
CLADOSPORIUM HERBARUM (M2) IGE: <0.1 kU/L
COMMON RAGWEED (SHORT) (W1) IGE: <0.1 kU/L
Cat Dander: <0.1 kU/L
Class: 0
Class: 0
Class: 0
Class: 0
Class: 0
Class: 0
Class: 0
Class: 0
Class: 0
Class: 0
Class: 0
Class: 0
Class: 0
Class: 0
Class: 0
Class: 0
Class: 0
Class: 0
Class: 0
Class: 0
Class: 0
Class: 0
Class: 0
Class: 0
Cockroach: <0.1 kU/L
D. farinae: <0.1 kU/L
Dog Dander: <0.1 kU/L
Elm IgE: <0.1 kU/L
IgE (Immunoglobulin E), Serum: 120 kU/L — ABNORMAL HIGH (ref <=114)
Johnson Grass: <0.1 kU/L
Pecan/Hickory Tree IgE: <0.1 kU/L
Rough Pigweed  IgE: <0.1 kU/L
Sheep Sorrel IgE: <0.1 kU/L
Timothy Grass: <0.1 kU/L

## 2017-09-25 LAB — INTERPRETATION:

## 2017-09-25 NOTE — Progress Notes (Signed)
Office Visit Note   Patient: Desiree [name]           Date of Birth: June 24, 1951           MRN: [id number] Visit Date: 09/25/2017              Requested by: [name], MD [place], [place] [address] PCP: [name], MD   Assessment & Plan: Visit Diagnoses:  1. Polyarthralgia   History of positive ANA   Plan: With patient's polyarthralgias affecting bilateral shoulders, wrists, hands, knees and ankles I will get blood work today to check a CBC and arthritis panel. Patient follow up in 2 weeks for recheck with Dr. Lorin Mercy discuss results.   Follow-Up Instructions: Return in about 2 weeks (around 10/09/2017) for to review labs and recheck.   Orders:  Orders Placed This Encounter  Procedures  . CBC  . Antinuclear Antib (ANA)  . Sed Rate (ESR)  . C-reactive protein  . Rheumatoid Factor  . Uric acid   No orders of the defined types were placed in this encounter.     Procedures: No procedures performed   Clinical Data: No additional findings.   Subjective: Chief Complaint  Patient presents with  . Neck - Follow-up  . Right Shoulder - Follow-up    HPI Patient returns for recheck of right shoulder after previous subacromial injection. States that she has had some improvement but continues to have pain with movement and reaching overhead and behind her back. Patient also complaining of pain in the left shoulder as well. With further questioning patient reports pain in the right greater than left wrist and also over the bilateral MCP joints. States that in the morning her hands are stiff and joints are little more achy and swollen. States that she drops objects at times during the day. This is not frequent. She's had previous bilateral carpal tunnel release outside of the office. Patient status post C5-6 ACDF December 2017. I reviewed patient's labs and she did have a positive ANA with the elevated ANA titer 06/29/2016. That lab was ordered by Dr.  Edilia Bo. I do not see that she was referred to rheumatology.   Review of Systems No current cardiac pulmonary GI GU issues  Objective: Vital Signs: BP 128/73   Pulse 90   Ht 5' 2"  (1.575 m)   Wt 152 lb (68.9 kg)   BMI 27.80 kg/m   Physical Exam  Constitutional: She is oriented to person, place, and time. No distress.  HENT:  Head: Normocephalic and atraumatic.  Eyes: EOM are normal. Pupils are equal, round, and reactive to light.  Pulmonary/Chest: No respiratory distress.  Neurological: She is alert and oriented to person, place, and time.    Ortho Exam  Specialty Comments:  No specialty comments available.  Imaging: No results found.   PMFS History: Patient Active Problem List   Diagnosis Date Noted  . Impingement syndrome of right shoulder 07/25/2017  . S/P lumbar fusion 07/25/2017  . Hx of fusion of cervical spine 07/25/2017  . Cervical spinal stenosis 09/27/2016  . Cervical spondylosis 09/27/2016  . Closed displaced fracture of proximal phalanx of lesser toe of right foot 09/08/2016  . Spinal stenosis of cervical region 09/08/2016  . Polypharmacy 06/23/2015  . Hyperlipidemia 04/19/2015  . Visit for preventive health examination 01/01/2015  . Primary osteoarthritis involving multiple joints 01/01/2015  . Bipolar affective disorder, currently depressed, moderate ([place])   . Bipolar I disorder with  mania ([place]) 08/17/2014  . Foot cramps 07/03/2014  . Hypertension 10/21/2013  . Ear fullness 10/21/2013  . Dizziness 03/25/2013  . Medication withdrawal ([place]) 03/05/2013  . Diabetes mellitus with renal manifestations, controlled ([place]) 11/10/2012  . Alopecia areata 02/06/2012  . Obesity (BMI 30-39.9) 02/06/2012  . Renal insufficiency 07/16/2011  . Bipolar disorder ([place])   . Neuroleptic-induced tardive dyskinesia   . Exertional dyspnea 02/07/2011  . Dyspnea on exertion 01/19/2011  . DIABETES MELLITUS, TYPE II, CONTROLLED 04/22/2010  . Carpal tunnel syndrome  02/02/2010  . CONSTIPATION 02/02/2010  . DEGENERATIVE JOINT DISEASE 02/02/2010  . TOBACCO USE, QUIT 07/28/2009  . CATARACTS 04/13/2009  . PAIN IN JOINT, ANKLE AND FOOT 09/16/2008  . EOSINOPHILIA 07/21/2008  . AFFECTIVE DISORDER 04/21/2008  . BACK PAIN, CHRONIC 02/12/2008  . LEG PAIN 02/12/2008  . ABNORMAL INVOLUNTARY MOVEMENTS 12/04/2007  . POSTURAL LIGHTHEADEDNESS 11/04/2007  . COLONIC POLYPS, HX OF 11/04/2007  . Essential hypertension 06/17/2007  . HYPERLIPIDEMIA 04/08/2007  . MITRAL VALVE PROLAPSE 04/08/2007  . GERD 04/08/2007  . LOW BACK PAIN SYNDROME 04/08/2007  . CHEST PAIN, RECURRENT 04/08/2007   Past Medical History:  Diagnosis Date  . Anxiety   . Bipolar disorder ([place])   . CANDIDIASIS, ESOPHAGEAL 07/28/2009   Qualifier: Diagnosis of  By: [name] MD, [name]   . Chronic kidney disease    CKD III  . COPD (chronic obstructive pulmonary disease) ([place])   . Depression   . Diabetes mellitus without complication (HCC)    no meds  . FH: colonic polyps   . Fractured elbow    right   . GERD (gastroesophageal reflux disease)   . HH (hiatus hernia)   . History of carpal tunnel syndrome   . History of chest pain   . History of transfusion of packed red blood cells   . Hyperlipidemia   . Hypertension   . Neuroleptic-induced tardive dyskinesia   . Osteoarthritis of more than one site   . Seasonal allergies     Family History  Problem Relation Age of Onset  . Heart attack Father   . Heart disease Father   . Throat cancer Brother   . Diabetes Mother   . Hypertension Mother   . Heart attack Mother   . Diabetes type II Brother   . Heart disease Brother   . Lung cancer Brother   . Breast cancer Cousin   . Lung cancer Daughter   . Lung cancer Paternal Uncle     Past Surgical History:  Procedure Laterality Date  . ANTERIOR CERVICAL DECOMP/DISCECTOMY FUSION  09/27/2016   C5-6 anterior cervical discectomy and fusion, allograft and plate/notes 09/27/2016  . ANTERIOR  CERVICAL DECOMP/DISCECTOMY FUSION N/A 09/27/2016   Procedure: C5-6 Anterior Cervical Discectomy and Fusion, Allograft and Plate;  Surgeon: [name], MD;  Location: [facility];  Service: Orthopedics;  Laterality: N/A;  . Back Fusion  2002  . CARPAL TUNNEL RELEASE  yates   left  . COLONOSCOPY N/A 01/05/2014   Procedure: COLONOSCOPY;  Surgeon: [name], MD;  Location: [facility];  Service: Endoscopy;  Laterality: N/A;  . EXTERNAL EAR SURGERY Left   . EYE SURGERY     "removed white dots under eyelid"  . FINGER SURGERY Left   . Juvara osteomy    . KNEE SURGERY    . Rt. toe bunion    . skin, shave biopsy  05/03/2016   Left occipital scalp, top of scalp   Social History   Occupational History  .  Not on file  Tobacco Use  . Smoking status: Former Smoker    Packs/day: 2.00    Years: 30.00    Pack years: 60.00    Types: Cigarettes    Last attempt to quit: 10/23/2001    Years since quitting: 15.9  . Smokeless tobacco: Never Used  Substance and Sexual Activity  . Alcohol use: No  . Drug use: No  . Sexual activity: Not on file

## 2017-09-25 NOTE — Telephone Encounter (Signed)
Copied from CRM [id number]. Topic: Quick Communication - See Telephone Encounter >> Sep 25, 2017  4:29 PM Arlyss Gandy, NT wrote: CRM for notification. See Telephone encounter for: Patient states that she would like a call from either Dr. Fabian Sharp or her nurse to go over her medication list. She has been informed that some of them are the same thing treating for the same problem and she would like to know which ones they are and if she can go off them or if the list can be updated. Please advise  09/25/17.

## 2017-09-26 ENCOUNTER — Ambulatory Visit (INDEPENDENT_AMBULATORY_CARE_PROVIDER_SITE_OTHER)
Admission: RE | Admit: 2017-09-26 | Discharge: 2017-09-26 | Disposition: A | Discharge status: Home / Self Care | Payer: Medicare HMO | Source: Ambulatory Visit | Attending: Pulmonary Disease | Admitting: Pulmonary Disease

## 2017-09-26 DIAGNOSIS — R059 Cough, unspecified: Secondary | ICD-10-CM

## 2017-09-26 DIAGNOSIS — J449 Chronic obstructive pulmonary disease, unspecified: Secondary | ICD-10-CM | POA: Diagnosis not present

## 2017-09-26 DIAGNOSIS — R05 Cough: Secondary | ICD-10-CM | POA: Diagnosis not present

## 2017-09-26 LAB — SEDIMENTATION RATE: Sed Rate: 11 mm/h (ref 0–30)

## 2017-09-26 LAB — CBC
HCT: 36.9 % (ref 35.0–45.0)
Hemoglobin: 12.5 g/dL (ref 11.7–15.5)
MCH: 29.8 pg (ref 27.0–33.0)
MCHC: 33.9 g/dL (ref 32.0–36.0)
MCV: 87.9 fL (ref 80.0–100.0)
MPV: 9.8 fL (ref 7.5–12.5)
Platelets: 329 10*3/uL (ref 140–400)
RBC: 4.2 10*6/uL (ref 3.80–5.10)
RDW: 13.2 % (ref 11.0–15.0)
WBC: 6.9 10*3/uL (ref 3.8–10.8)

## 2017-09-26 LAB — URIC ACID: Uric Acid, Serum: 4.2 mg/dL (ref 2.5–7.0)

## 2017-09-26 LAB — ANA: Anti Nuclear Antibody(ANA): NEGATIVE

## 2017-09-26 LAB — RHEUMATOID FACTOR: Rhuematoid fact SerPl-aCnc: 127 IU/mL — ABNORMAL HIGH (ref <14)

## 2017-09-26 LAB — C-REACTIVE PROTEIN: CRP: 2.2 mg/L (ref <8.0)

## 2017-09-26 NOTE — Telephone Encounter (Signed)
Per Dr Fabian Sharp, of medications being prescribed on her current med list there are no duplicates or overlapping medications. I cannot advise on Pysch Meds/BH Meds.

## 2017-09-27 NOTE — Telephone Encounter (Signed)
Pt aware of recommendations per Dr Fabian Sharp. Nothing further needed.

## 2017-10-03 NOTE — Progress Notes (Signed)
LMOMTCB x 1 

## 2017-10-04 ENCOUNTER — Telehealth: Payer: Self-pay | Admitting: Pulmonary Disease

## 2017-10-04 NOTE — Telephone Encounter (Signed)
Spoke with pt, aware of results/recs.  Nothing further needed.  

## 2017-10-04 NOTE — Telephone Encounter (Signed)
,   Please let the patient know this showed some scarring in her lungs, we will discuss more on the next visit. Thanks, B   lmomtcb x 1 for pt to call back.

## 2017-10-09 ENCOUNTER — Telehealth (INDEPENDENT_AMBULATORY_CARE_PROVIDER_SITE_OTHER): Payer: Self-pay | Admitting: Orthopaedic Surgery

## 2017-10-09 NOTE — Telephone Encounter (Signed)
I called and left voice mail to call me back. 

## 2017-10-09 NOTE — Telephone Encounter (Signed)
Patient called back after you left, please call her again tomorrow when you get the chance. CB # [phone]

## 2017-10-09 NOTE — Telephone Encounter (Signed)
ucall thanks 

## 2017-10-09 NOTE — Telephone Encounter (Signed)
James ordered lab work at [facility] last visit. Please advise.

## 2017-10-09 NOTE — Telephone Encounter (Signed)
Patient is wondering if her test results are back in. Please call back if they are #  [phone]

## 2017-10-10 ENCOUNTER — Telehealth: Payer: Self-pay

## 2017-10-10 NOTE — Telephone Encounter (Signed)
Patient was returning Desiree [name] phone call.  Cb# is [phone] or [phone].  Please advise. Thank you.

## 2017-10-10 NOTE — Telephone Encounter (Signed)
Phone went straight to voice mail. Left message advising patient to call me back.

## 2017-10-11 ENCOUNTER — Telehealth: Payer: Self-pay | Admitting: Pulmonary Disease

## 2017-10-11 MED ORDER — AZITHROMYCIN 250 MG PO TABS: ORAL_TABLET | ORAL | 0 refills | Status: AC

## 2017-10-11 NOTE — Telephone Encounter (Signed)
Spoke with pt. States that she is not feeling well. Reports cough, wheezing, SOB and sore throat. Cough is producing yellow mucus. Denies chest tightness or fever. Symptoms started before her last OV with BQ >> 08/2017. Cough is worse at night. Pt would like BQ's recommendations.  BQ - please advise. Thanks.

## 2017-10-11 NOTE — Telephone Encounter (Signed)
Prednisone 20mg  daily x 5 days zpack

## 2017-10-11 NOTE — Telephone Encounter (Signed)
Spoke with pt. She is aware of BQ's recommendations. States that she has prednisone on hand, that will not need to be sent in. Rx for Zpack has been sent in. Nothing further was needed.

## 2017-10-12 ENCOUNTER — Telehealth: Payer: Self-pay | Admitting: Pulmonary Disease

## 2017-10-12 NOTE — Telephone Encounter (Signed)
lmtcb for pt to make aware of recs.  

## 2017-10-12 NOTE — Telephone Encounter (Signed)
Spoke with pt, aware of recs.  We do not have availability in our office, pt will seek UC.  Nothing further needed.

## 2017-10-12 NOTE — Telephone Encounter (Signed)
Needs OV or ER/Urgent care

## 2017-10-12 NOTE — Telephone Encounter (Signed)
Spoke with pt. She called in yesterday stating that she was not feeling well. BQ prescribed her prednisone. States that she is vomiting, diarrhea, red rash and she is "shakey." Advised her not to take anymore prednisone. Pt would like BQ's recommendations.  BQ - please advise.
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
## 2017-10-17 ENCOUNTER — Ambulatory Visit: Payer: Medicare HMO | Admitting: Internal Medicine

## 2017-10-21 ENCOUNTER — Telehealth: Payer: Self-pay | Admitting: Pulmonary Disease

## 2017-10-21 MED ORDER — AZITHROMYCIN 250 MG PO TABS: ORAL_TABLET | ORAL | 0 refills | Status: DC

## 2017-10-21 NOTE — Telephone Encounter (Signed)
She was started on Zpak 10/11/17.  Felt better.  Symptoms recurred once this was stopped.  She is having wheeze, chest congestion, sinus congestion.  She is coughing up clear to yellow sputum.  Feels hot/cold and gets sweats.  Denies chest pain, sore throat, hemoptysis.  Reports having rash from recent course of prednisone >> resolved.  Using anoro, singulair, flonase, claritin.  Recent CT chest showed bronchiectasis, and she has elevated rheumatoid factor.  Will call in script for another round of Zpak.  She has f/u with Tammy Parrett on 10/24/17.

## 2017-10-24 ENCOUNTER — Ambulatory Visit (INDEPENDENT_AMBULATORY_CARE_PROVIDER_SITE_OTHER): Payer: Medicare HMO | Admitting: Pulmonary Disease

## 2017-10-24 ENCOUNTER — Ambulatory Visit (INDEPENDENT_AMBULATORY_CARE_PROVIDER_SITE_OTHER): Payer: Medicare HMO | Admitting: Adult Health

## 2017-10-24 ENCOUNTER — Telehealth: Payer: Self-pay | Admitting: Pulmonary Disease

## 2017-10-24 ENCOUNTER — Encounter: Payer: Self-pay | Admitting: Adult Health

## 2017-10-24 ENCOUNTER — Other Ambulatory Visit (INDEPENDENT_AMBULATORY_CARE_PROVIDER_SITE_OTHER): Payer: Self-pay | Admitting: Radiology

## 2017-10-24 DIAGNOSIS — R269 Unspecified abnormalities of gait and mobility: Secondary | ICD-10-CM | POA: Diagnosis not present

## 2017-10-24 DIAGNOSIS — J479 Bronchiectasis, uncomplicated: Secondary | ICD-10-CM | POA: Diagnosis not present

## 2017-10-24 DIAGNOSIS — R059 Cough, unspecified: Secondary | ICD-10-CM

## 2017-10-24 DIAGNOSIS — R05 Cough: Secondary | ICD-10-CM

## 2017-10-24 DIAGNOSIS — R0609 Other forms of dyspnea: Secondary | ICD-10-CM

## 2017-10-24 DIAGNOSIS — R0600 Dyspnea, unspecified: Secondary | ICD-10-CM | POA: Diagnosis not present

## 2017-10-24 DIAGNOSIS — M2550 Pain in unspecified joint: Secondary | ICD-10-CM

## 2017-10-24 LAB — PULMONARY FUNCTION TEST
DL/VA % pred: 87 %
DL/VA: 4 ml/min/mmHg/L
DLCO cor % pred: 60 %
DLCO cor: 13.39 ml/min/mmHg
DLCO unc % pred: 59 %
DLCO unc: 13.3 ml/min/mmHg
FEF 25-75 Post: 0.91 L/sec
FEF 25-75 Pre: 0.96 L/sec
FEF2575-%Change-Post: -5 %
FEF2575-%Pred-Post: 51 %
FEF2575-%Pred-Pre: 54 %
FEV1-%Change-Post: 1 %
FEV1-%Pred-Post: 74 %
FEV1-%Pred-Pre: 73 %
FEV1-Post: 1.36 L
FEV1-Pre: 1.34 L
FEV1FVC-%Change-Post: 5 %
FEV1FVC-%Pred-Pre: 91 %
FEV6-%Change-Post: -4 %
FEV6-%Pred-Post: 78 %
FEV6-%Pred-Pre: 82 %
FEV6-Post: 1.77 L
FEV6-Pre: 1.86 L
FEV6FVC-%Change-Post: 0 %
FEV6FVC-%Pred-Post: 102 %
FEV6FVC-%Pred-Pre: 103 %
FVC-%Change-Post: -4 %
FVC-%Pred-Post: 76 %
FVC-%Pred-Pre: 79 %
FVC-Post: 1.79 L
FVC-Pre: 1.87 L
Post FEV1/FVC ratio: 76 %
Post FEV6/FVC ratio: 99 %
Pre FEV1/FVC ratio: 72 %
Pre FEV6/FVC Ratio: 99 %
RV % pred: 179 %
RV: 3.62 L
TLC % pred: 128 %
TLC: 6.19 L

## 2017-10-24 MED ORDER — FLUTTER DEVI: 1.0000 | 0 refills | Status: AC

## 2017-10-24 MED ORDER — UMECLIDINIUM-VILANTEROL 62.5-25 MCG/INH IN AEPB: 1.0000 | INHALATION_SPRAY | Freq: Every day | RESPIRATORY_TRACT | 0 refills | Status: DC

## 2017-10-24 NOTE — Progress Notes (Signed)
PFT done today. 

## 2017-10-24 NOTE — Patient Instructions (Addendum)
Continue on ANORO daily .  Mucinex Twice daily  As needed  Cough/congestion  Flutter valve Three times a day  As needed  Cough/congestion  Continue on Zyrtec and Singulair.  Follow up with Rheumatology as discussed  follow up Dr. Kendrick Fries in 6 weeks and As needed   Please contact office for sooner follow up if symptoms do not improve or worsen or seek emergency care
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
## 2017-10-24 NOTE — Telephone Encounter (Signed)
Spoke with pt letting her know we had samples of Anoro and I was placing them up front for her to pick up. Pt expressed understanding. Nothing further needed.

## 2017-10-24 NOTE — Progress Notes (Signed)
_0  ID: Desiree [name], female    DOB: [birth year], 68 y.o.   MRN: [id number]  Chief Complaint  Patient presents with  . Follow-up    sob     Referring provider: [name], MD  HPI: 67 yo female former smoker followed for chronic cough  Has GERD and hypertrophic cardiomyopathy  TEST  Chest x-ray: August 22, 2017 images independently reviewed showing normal pulmonary lung fields.   Echocardiogram: June 27, 2017 transthoracic US echocardiogram LVEF 65-70%, severe asymmetric septal hypertrophy grade 1 diastolic dysfunction, right ventricle cavity size normal, systolic function normal  PFT: November 2018 spirometry normal   10/24/16  Follow up : Dyspnea /PFT /CT chest  Patient returns for one-month follow-up.  Patient has been evaluated for ongoing cough and shortness of breath.  She was set up for a high resolution CT chest that showed mild bronchiectasis in the bilateral lower lobes with scattered mucoid impaction, mild inflammatory scarring in the mid to lower lungs bilaterally.  PFT done today showed mild restriction with an FEV1 at 74%, ratio 76, FVC 76%, no significant bronchodilator response.  Positive diffusing defect with a DLCO at 59% Patient says that she is recently had lab work that showed possible rheumatoid arthritis.  She has been referred to rheumatology with appointment pending.  Patient says she still has some intermittent cough.  She is on [facility] which she feels may help sometimes. She does have intermittent congestion that is white.  She denies any fever chest pain orthopnea or PND.    Allergies  Allergen Reactions  . Amoxicillin Hives and Rash    Has patient had a PCN reaction causing immediate rash, facial/tongue/throat swelling, SOB or lightheadedness with hypotension:Yes Has patient had a PCN reaction causing severe rash involving mucus membranes or skin necrosis: NO Has patient had a PCN reaction that required hospitalization NO Has patient  had a PCN reaction occurring within the last 10 years: NO If all of the above answers are "NO", then may proceed with Cephalosporin use.   . Codeine Hives, Itching and Rash  . Hydrocodone-Acetaminophen Hives, Itching and Rash  . Penicillins Hives, Itching and Rash    ALLERGIC REACTION TO ORAL AMOXICILLIN Has patient had a PCN reaction causing immediate rash, facial/tongue/throat swelling, SOB or lightheadedness with hypotension: Yes Has patient had a PCN reaction causing severe rash involving mucus membranes or skin necrosis: No Has patient had a PCN reaction that required hospitalization: No Has patient had a PCN reaction occurring within the last 10 years: No If all of the above answers are "NO", then may proceed with Cephalosporin use.  . Prednisone Itching    Immunization History  Administered Date(s) Administered  . H1N1 11/13/2008  . Influenza Whole 07/21/2008, 07/28/2009, 06/28/2010, 07/05/2011, 09/22/2012  . Influenza, High Dose Seasonal PF 07/05/2017  . Influenza,inj,Quad PF,6+ Mos 07/11/2013, 07/03/2014, 06/23/2015, 06/29/2016  . Pneumococcal Conjugate-13 12/19/2013  . Pneumococcal Polysaccharide-23 02/25/2009, 04/19/2015  . Td 02/02/2010  . Tdap 04/25/2016    Past Medical History:  Diagnosis Date  . Anxiety   . Bipolar disorder ([place])   . CANDIDIASIS, ESOPHAGEAL 07/28/2009   Qualifier: Diagnosis of  By: [name] MD, [name]   . Chronic kidney disease    CKD III  . COPD (chronic obstructive pulmonary disease) ([place])   . Depression   . Diabetes mellitus without complication (HCC)    no meds  . FH: colonic polyps   . Fractured elbow    right   . GERD (gastroesophageal  reflux disease)   . HH (hiatus hernia)   . History of carpal tunnel syndrome   . History of chest pain   . History of transfusion of packed red blood cells   . Hyperlipidemia   . Hypertension   . Neuroleptic-induced tardive dyskinesia   . Osteoarthritis of more than one site   . Seasonal allergies       Tobacco History: Social History   Tobacco Use  Smoking Status Former Smoker  . Packs/day: 2.00  . Years: 30.00  . Pack years: 60.00  . Types: Cigarettes  . Last attempt to quit: 10/23/2001  . Years since quitting: 16.0  Smokeless Tobacco Never Used   Counseling given: Not Answered   Outpatient Encounter Medications as of 10/24/2017  Medication Sig  . acetaminophen (TYLENOL) 500 MG tablet Take 2 tablets (1,000 mg total) by mouth every 6 (six) hours as needed. (Patient taking differently: Take 1,000 mg by mouth every 6 (six) hours as needed (for pain/headache.). )  . atorvastatin (LIPITOR) 20 MG tablet take 1 tablet by mouth once daily  . Blood Glucose Monitoring Suppl (ACCU-CHEK NANO SMARTVIEW) W/DEVICE KIT Use to check blood sugar 1-2 times daily (Patient taking differently: 1 strip by Other route 2 (two) times a week. )  . cetirizine (ZYRTEC) 10 MG tablet Take 10 mg by mouth at bedtime.   . docusate sodium (COLACE) 100 MG capsule Take 100 mg by mouth at bedtime.   . DULoxetine (CYMBALTA) 60 MG capsule Take 1 capsule (60 mg total) by mouth daily.  . fluticasone (FLONASE) 50 MCG/ACT nasal spray Place 1-2 sprays into both nostrils at bedtime.   Marland Kitchen glucose blood test strip Accu-chek nano, test glucose once daily, dx E11.9  . lamoTRIgine (LAMICTAL) 25 MG tablet Take 1 tablet (25 mg total) by mouth 2 (two) times daily.  . Lancets Misc. (ACCU-CHEK FASTCLIX LANCET) KIT USE TO CHECK BLOOD SUGAR 1-2 TIMES DAILY  . losartan (COZAAR) 25 MG tablet take 1 tablet by mouth once daily  . montelukast (SINGULAIR) 10 MG tablet Take 1 tablet (10 mg total) at bedtime by mouth.  . Multiple Vitamins-Minerals (CENTRUM SILVER PO) Take 1 tablet by mouth daily.   . ondansetron (ZOFRAN-ODT) 4 MG disintegrating tablet Take 1 tablet (4 mg total) by mouth every 8 (eight) hours as needed for nausea or vomiting.  Marland Kitchen OVER THE COUNTER MEDICATION Take 1 capsule by mouth every morning. IBgard & Fgard  . pantoprazole  (PROTONIX) 40 MG tablet Take 40 mg by mouth daily.  . Probiotic Product (ALIGN PO) Take 1 capsule by mouth at bedtime. Align  . [DISCONTINUED] azithromycin (ZITHROMAX) 250 MG tablet 2 pills on day 1, then 1 pill daily for next 4 days  . [DISCONTINUED] umeclidinium-vilanterol (ANORO ELLIPTA) 62.5-25 MCG/INH AEPB Inhale 1 puff daily into the lungs.  . [DISCONTINUED] umeclidinium-vilanterol (ANORO ELLIPTA) 62.5-25 MCG/INH AEPB Inhale 1 puff into the lungs daily.  Marland Kitchen Respiratory Therapy Supplies (FLUTTER) DEVI 1 Device by Does not apply route as directed.  . [DISCONTINUED] predniSONE (DELTASONE) 20 MG tablet Take 1 tablet (20 mg total) by mouth daily with breakfast. (Patient not taking: Reported on 10/25/2017)   No facility-administered encounter medications on file as of 10/24/2017.      Review of Systems  Constitutional:   No  weight loss, night sweats,  Fevers, chills, fatigue, or  lassitude.  HEENT:   No headaches,  Difficulty swallowing,  Tooth/dental problems, or  Sore throat,  No sneezing, itching, ear ache +nasal congestion, post nasal drip,   CV:  No chest pain,  Orthopnea, PND, swelling in lower extremities, anasarca, dizziness, palpitations, syncope.   GI  No heartburn, indigestion, abdominal pain, nausea, vomiting, diarrhea, change in bowel habits, loss of appetite, bloody stools.   Resp:  No chest wall deformity  Skin: no rash or lesions.  GU: no dysuria, change in color of urine, no urgency or frequency.  No flank pain, no hematuria   MS:  No joint pain or swelling.  No decreased range of motion.  No back pain.    Physical Exam  BP 130/72 (BP Location: Left Arm, Cuff Size: Normal)   Pulse 90   Ht 5' 2.5" (1.588 m)   Wt 154 lb 3.2 oz (69.9 kg)   SpO2 100%   BMI 27.75 kg/m   GEN: A/Ox3; pleasant , NAD, well nourished    HEENT:  [place]/AT,  EACs-clear, TMs-wnl, NOSE-clear, THROAT-clear, no lesions, no postnasal drip or exudate noted.   NECK:  Supple w/ fair  ROM; no JVD; normal carotid impulses w/o bruits; no thyromegaly or nodules palpated; no lymphadenopathy.    RESP  Clear  P & A; w/o, wheezes/ rales/ or rhonchi. no accessory muscle use, no dullness to percussion  CARD:  RRR, no m/r/g, no peripheral edema, pulses intact, no cyanosis or clubbing.  GI:   Soft & nt; nml bowel sounds; no organomegaly or masses detected.   Musco: Warm bil, no deformities or joint swelling noted.   Neuro: alert, no focal deficits noted.    Skin: Warm, no lesions or rashes    Lab Results:  CBC    Component Value Date/Time   WBC 6.9 09/25/2017 1400   RBC 4.20 09/25/2017 1400   HGB 12.5 09/25/2017 1400   HCT 36.9 09/25/2017 1400   PLT 329 09/25/2017 1400   MCV 87.9 09/25/2017 1400   MCH 29.8 09/25/2017 1400   MCHC 33.9 09/25/2017 1400   RDW 13.2 09/25/2017 1400   LYMPHSABS 1.9 08/07/2017 1527   MONOABS 0.6 08/07/2017 1527   EOSABS 0.2 08/07/2017 1527   BASOSABS 0.1 08/07/2017 1527    BMET    Component Value Date/Time   NA 141 08/07/2017 1527   K 4.4 08/07/2017 1527   CL 104 08/07/2017 1527   CO2 27 08/07/2017 1527   GLUCOSE 82 08/07/2017 1527   BUN 16 08/07/2017 1527   CREATININE 1.48 (H) 08/07/2017 1527   CALCIUM 9.7 08/07/2017 1527   CALCIUM 10.1 06/25/2008 0000   GFRNONAA 48 (L) 12/05/2016 1747   GFRAA 55 (L) 12/05/2016 1747    BNP No results found for: BNP  ProBNP    Component Value Date/Time   PROBNP 5.0 12/02/2014 0906    Imaging: Ct Chest High Resolution  Result Date: 10/01/2017 CLINICAL DATA:  New onset productive cough for several months with chest tightness. Former smoker. COPD. EXAM: CT CHEST WITHOUT CONTRAST TECHNIQUE: Multidetector CT imaging of the chest was performed following the standard protocol without intravenous contrast. High resolution imaging of the lungs, as well as inspiratory and expiratory imaging, was performed. COMPARISON:  08/22/2017 chest radiograph.   08/01/2013 chest CT. FINDINGS:  Cardiovascular: Normal heart size. No significant pericardial fluid/thickening. Atherosclerotic nonaneurysmal thoracic aorta. Normal caliber pulmonary arteries. Mediastinum/Nodes: No discrete thyroid nodules. Unremarkable esophagus. No pathologically enlarged axillary, mediastinal or gross hilar lymph nodes, noting limited sensitivity for the detection of hilar adenopathy on this noncontrast study. Lungs/Pleura: No pneumothorax. No pleural effusion. Mild centrilobular  emphysema. No acute consolidative airspace disease, lung masses or significant pulmonary nodules. There is scattered mild cylindrical and varicoid bronchiectasis in the basilar lower lobes bilaterally with associated scattered mucoid impaction, which appears new since 08/01/2013 chest CT. No significant regions of subpleural reticulation, ground-glass attenuation, architectural distortion or frank honeycombing. No significant lobular air trapping on the expiration sequence. Scattered parenchymal bands in the mid to lower lungs bilaterally are compatible with mild postinfectious/ postinflammatory scarring. Upper abdomen: No acute abnormality. Musculoskeletal: No aggressive appearing focal osseous lesions. Moderate thoracic spondylosis. IMPRESSION: 1. Mild cylindrical and varicoid bronchiectasis in the bilateral basilar lower lobes with scattered mucoid impaction, new since 2014 chest CT, consider recurrent aspiration given the distribution. 2. Mild postinfectious/postinflammatory scarring in the mid to lower lungs bilaterally. Aortic Atherosclerosis (ICD10-I70.0) and Emphysema (ICD10-J43.9). Electronically Signed   By: Ilona Sorrel M.D.   On: 10/01/2017 08:29     Assessment & Plan:   Bronchiectasis without complication (HCC) Mild bronchiectasis noted on CT chest.  Patient has some mild restriction with a diffusing defect noted on PFTs. Patient will continue on her current inhaler with ANORO.  We will add Mucinex and flutter valve to help with  mucociliary clearance. Rheumatoid factor was elevated.  Patient has an upcoming rheumatology consult that is pending.  Advised her to continue with this consult.   Plan  Patient Instructions  Continue on ANORO daily .  Mucinex Twice daily  As needed  Cough/congestion  Flutter valve Three times a day  As needed  Cough/congestion  Continue on Zyrtec and Singulair.  Follow up with Rheumatology as discussed  follow up Dr. Lake Bells in 6 weeks and As needed   Please contact office for sooner follow up if symptoms do not improve or worsen or seek emergency care          Rexene Edison, NP

## 2017-10-24 NOTE — Progress Notes (Signed)
Reviewed, agree 

## 2017-10-25 ENCOUNTER — Encounter: Payer: Self-pay | Admitting: Internal Medicine

## 2017-10-25 ENCOUNTER — Ambulatory Visit: Payer: Medicare HMO | Admitting: Internal Medicine

## 2017-10-25 ENCOUNTER — Telehealth: Payer: Self-pay | Admitting: Pulmonary Disease

## 2017-10-25 VITALS — BP 132/76 | HR 108 | Temp 98.4°F | Wt 151.0 lb

## 2017-10-25 DIAGNOSIS — E1122 Type 2 diabetes mellitus with diabetic chronic kidney disease: Secondary | ICD-10-CM

## 2017-10-25 DIAGNOSIS — N289 Disorder of kidney and ureter, unspecified: Secondary | ICD-10-CM | POA: Diagnosis not present

## 2017-10-25 DIAGNOSIS — Z79899 Other long term (current) drug therapy: Secondary | ICD-10-CM | POA: Diagnosis not present

## 2017-10-25 DIAGNOSIS — R634 Abnormal weight loss: Secondary | ICD-10-CM

## 2017-10-25 DIAGNOSIS — R1110 Vomiting, unspecified: Secondary | ICD-10-CM

## 2017-10-25 DIAGNOSIS — J479 Bronchiectasis, uncomplicated: Secondary | ICD-10-CM

## 2017-10-25 DIAGNOSIS — I10 Essential (primary) hypertension: Secondary | ICD-10-CM

## 2017-10-25 DIAGNOSIS — R768 Other specified abnormal immunological findings in serum: Secondary | ICD-10-CM

## 2017-10-25 NOTE — Patient Instructions (Addendum)
Stay on the   The losartan   Good for kidney and diabetes protection.   Fu with pulmonary   And need to see rheumatology .  A planned . If ongoing  Vomiting and weight loss without cause  I would like you to see   Dr Loreta Ave about this problem   And thus  I will   A referral   About this .   ROV in 3-4 months . Marland Kitchen

## 2017-10-25 NOTE — Assessment & Plan Note (Signed)
Mild bronchiectasis noted on CT chest.  Patient has some mild restriction with a diffusing defect noted on PFTs. Patient will continue on her current inhaler with ANORO.  We will add Mucinex and flutter valve to help with mucociliary clearance. Rheumatoid factor was elevated.  Patient has an upcoming rheumatology consult that is pending.  Advised her to continue with this consult.   Plan  Patient Instructions  Continue on ANORO daily .  Mucinex Twice daily  As needed  Cough/congestion  Flutter valve Three times a day  As needed  Cough/congestion  Continue on Zyrtec and Singulair.  Follow up with Rheumatology as discussed  follow up Dr. Kendrick Fries in 6 weeks and As needed   Please contact office for sooner follow up if symptoms do not improve or worsen or seek emergency care

## 2017-10-25 NOTE — Telephone Encounter (Signed)
Called and spoke with pt.  Pt is requesting copy of CT and disc.  Pt aware that she will need to contact [facility] CT for disc. I have provided pt with [facility] CT contact number.  Copy of CT will be mailed to address on file- verified with pt.  Nothing further is needed.

## 2017-11-05 DIAGNOSIS — F314 Bipolar disorder, current episode depressed, severe, without psychotic features: Secondary | ICD-10-CM | POA: Diagnosis not present

## 2017-11-06 DIAGNOSIS — K219 Gastro-esophageal reflux disease without esophagitis: Secondary | ICD-10-CM | POA: Diagnosis not present

## 2017-11-06 DIAGNOSIS — R140 Abdominal distension (gaseous): Secondary | ICD-10-CM | POA: Diagnosis not present

## 2017-11-06 DIAGNOSIS — R110 Nausea: Secondary | ICD-10-CM | POA: Diagnosis not present

## 2017-11-06 DIAGNOSIS — K5904 Chronic idiopathic constipation: Secondary | ICD-10-CM | POA: Diagnosis not present

## 2017-11-09 ENCOUNTER — Encounter (INDEPENDENT_AMBULATORY_CARE_PROVIDER_SITE_OTHER): Payer: Self-pay | Admitting: Orthopaedic Surgery

## 2017-11-09 ENCOUNTER — Ambulatory Visit (INDEPENDENT_AMBULATORY_CARE_PROVIDER_SITE_OTHER): Payer: Medicare HMO | Admitting: Orthopaedic Surgery

## 2017-11-09 VITALS — BP 136/75 | HR 93 | Ht 62.0 in | Wt 152.0 lb

## 2017-11-09 DIAGNOSIS — M545 Low back pain, unspecified: Secondary | ICD-10-CM

## 2017-11-09 DIAGNOSIS — G8929 Other chronic pain: Secondary | ICD-10-CM

## 2017-11-09 NOTE — Progress Notes (Signed)
Office Visit Note   Patient: Desiree [name]           Date of Birth: 06-Aug-1951           MRN: [id number] Visit Date: 11/09/2017              Requested by: Madelin Headings, MD [address] [place], [place] [address] PCP: Madelin Headings, MD   Assessment & Plan: Visit Diagnoses:  1. Chronic bilateral low back pain without sciatica           With fusion L4-5 and facet arthropathy at L5-S1  Plan: She has a rheumatology appointment coming up.  I can check her back again as needed.  If she gets increasing leg symptoms she can return.  Follow-Up Instructions: No Follow-up on file.   Orders:  No orders of the defined types were placed in this encounter.  No orders of the defined types were placed in this encounter.     Procedures: No procedures performed   Clinical Data: No additional findings.   Subjective: Chief Complaint  Patient presents with  . Joint Pain    6 week follow up-review labs    HPI 67 year old female returns.  She has some lab work which shows that her previous ANA 1 year ago was positive and is now negative.  Rheumatoid factor I 27.  Uric acid 4.2, CRP 2.2 and sed rate was 11.  WBC was 6.9K.  She has an upcoming rheumatologic appointment to review her positive rheumatoid factor.  She has ongoing back symptoms with some pain that radiates in both right and left leg intermittently and sometimes has shooting pains.  She has had previous pseudoarthrosis treated at L4-5 with cage and pedicle instrumentation 2011 at the L4-5 level.  MRI scan 2017 showed progression of facet arthropathy at L5-S1 which had progressed.  Patient is using some Tylenol which tends to improve her symptoms.  Review of Systems 14 point review of systems positive for positive rheumatoid factor, cervical spondylosis previous cervical fusion doing well.  Carpal tunnel release, diabetes, GERD, hypertension, mitral valve prolapse, previous L4-5 fusion with progression at L5-S1 facet  arthropathy.  Bipolar disorder, history of renal insufficiency.  Otherwise negative as it pertains HPI.   Objective: Vital Signs: BP 136/75   Pulse 93   Ht 5\' 2"  (1.575 m)   Wt 152 lb (68.9 kg)   BMI 27.80 kg/m   Physical Exam  Constitutional: She is oriented to person, place, and time. She appears well-developed.  HENT:  Head: Normocephalic.  Right Ear: External ear normal.  Left Ear: External ear normal.  Eyes: Pupils are equal, round, and reactive to light.  Neck: No tracheal deviation present. No thyromegaly present.  Cardiovascular: Normal rate.  Pulmonary/Chest: Effort normal.  Abdominal: Soft.  Neurological: She is alert and oriented to person, place, and time.  Skin: Skin is warm and dry.  Psychiatric: She has a normal mood and affect. Her behavior is normal.    Ortho Exam patient is able get her arms above her head.  Mild discomfort with internal rotation the right shoulder.  Well-healed cervical incision.  Normal heel toe gait.  Well-healed lumbar incision.  Specialty Comments:  No specialty comments available.  Imaging: No results found.   PMFS History: Patient Active Problem List   Diagnosis Date Noted  . Bronchiectasis without complication (HCC) 10/25/2017  . Impingement syndrome of right shoulder 07/25/2017  . S/P lumbar fusion 07/25/2017  . Hx of fusion  of cervical spine 07/25/2017  . Cervical spinal stenosis 09/27/2016  . Cervical spondylosis 09/27/2016  . Closed displaced fracture of proximal phalanx of lesser toe of right foot 09/08/2016  . Spinal stenosis of cervical region 09/08/2016  . Polypharmacy 06/23/2015  . Hyperlipidemia 04/19/2015  . Visit for preventive health examination 01/01/2015  . Primary osteoarthritis involving multiple joints 01/01/2015  . Bipolar affective disorder, currently depressed, moderate (HCC)   . Bipolar I disorder with mania (HCC) 08/17/2014  . Foot cramps 07/03/2014  . Hypertension 10/21/2013  . Ear fullness  10/21/2013  . Dizziness 03/25/2013  . Medication withdrawal (HCC) 03/05/2013  . Diabetes mellitus with renal manifestations, controlled (HCC) 11/10/2012  . Alopecia areata 02/06/2012  . Obesity (BMI 30-39.9) 02/06/2012  . Renal insufficiency 07/16/2011  . Bipolar disorder (HCC)   . Neuroleptic-induced tardive dyskinesia   . Exertional dyspnea 02/07/2011  . Dyspnea on exertion 01/19/2011  . DIABETES MELLITUS, TYPE II, CONTROLLED 04/22/2010  . Carpal tunnel syndrome 02/02/2010  . CONSTIPATION 02/02/2010  . DEGENERATIVE JOINT DISEASE 02/02/2010  . TOBACCO USE, QUIT 07/28/2009  . CATARACTS 04/13/2009  . PAIN IN JOINT, ANKLE AND FOOT 09/16/2008  . EOSINOPHILIA 07/21/2008  . AFFECTIVE DISORDER 04/21/2008  . BACK PAIN, CHRONIC 02/12/2008  . LEG PAIN 02/12/2008  . ABNORMAL INVOLUNTARY MOVEMENTS 12/04/2007  . POSTURAL LIGHTHEADEDNESS 11/04/2007  . COLONIC POLYPS, HX OF 11/04/2007  . Essential hypertension 06/17/2007  . HYPERLIPIDEMIA 04/08/2007  . MITRAL VALVE PROLAPSE 04/08/2007  . GERD 04/08/2007  . LOW BACK PAIN SYNDROME 04/08/2007  . CHEST PAIN, RECURRENT 04/08/2007   Past Medical History:  Diagnosis Date  . Anxiety   . Bipolar disorder (HCC)   . CANDIDIASIS, ESOPHAGEAL 07/28/2009   Qualifier: Diagnosis of  By: Fabian Sharp MD, Neta Mends   . Chronic kidney disease    CKD III  . COPD (chronic obstructive pulmonary disease) (HCC)   . Depression   . Diabetes mellitus without complication (HCC)    no meds  . FH: colonic polyps   . Fractured elbow    right   . GERD (gastroesophageal reflux disease)   . HH (hiatus hernia)   . History of carpal tunnel syndrome   . History of chest pain   . History of transfusion of packed red blood cells   . Hyperlipidemia   . Hypertension   . Neuroleptic-induced tardive dyskinesia   . Osteoarthritis of more than one site   . Seasonal allergies     Family History  Problem Relation Age of Onset  . Heart attack Father   . Heart disease Father     . Throat cancer Brother   . Diabetes Mother   . Hypertension Mother   . Heart attack Mother   . Diabetes type II Brother   . Heart disease Brother   . Lung cancer Brother   . Breast cancer Cousin   . Lung cancer Daughter   . Lung cancer Paternal Uncle     Past Surgical History:  Procedure Laterality Date  . ANTERIOR CERVICAL DECOMP/DISCECTOMY FUSION  09/27/2016   C5-6 anterior cervical discectomy and fusion, allograft and plate/notes 94/12/2759  . ANTERIOR CERVICAL DECOMP/DISCECTOMY FUSION N/A 09/27/2016   Procedure: C5-6 Anterior Cervical Discectomy and Fusion, Allograft and Plate;  Surgeon: Eldred Manges, MD;  Location: [facility] OR;  Service: Orthopedics;  Laterality: N/A;  . Back Fusion  2002  . CARPAL TUNNEL RELEASE  Etty Isaac   left  . COLONOSCOPY N/A 01/05/2014   Procedure: COLONOSCOPY;  Surgeon: Charna Elizabeth,  MD;  Location: [facility] ENDOSCOPY;  Service: Endoscopy;  Laterality: N/A;  . EXTERNAL EAR SURGERY Left   . EYE SURGERY     "removed white dots under eyelid"  . FINGER SURGERY Left   . Juvara osteomy    . KNEE SURGERY    . Rt. toe bunion    . skin, shave biopsy  05/03/2016   Left occipital scalp, top of scalp   Social History   Occupational History  . Not on file  Tobacco Use  . Smoking status: Former Smoker    Packs/day: 2.00    Years: 30.00    Pack years: 60.00    Types: Cigarettes    Last attempt to quit: 10/23/2001    Years since quitting: 16.0  . Smokeless tobacco: Never Used  Substance and Sexual Activity  . Alcohol use: No  . Drug use: No  . Sexual activity: Not on file
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
## 2017-11-14 DIAGNOSIS — H40021 Open angle with borderline findings, high risk, right eye: Secondary | ICD-10-CM | POA: Diagnosis not present

## 2017-11-14 DIAGNOSIS — H25013 Cortical age-related cataract, bilateral: Secondary | ICD-10-CM | POA: Diagnosis not present

## 2017-11-15 ENCOUNTER — Ambulatory Visit (INDEPENDENT_AMBULATORY_CARE_PROVIDER_SITE_OTHER): Payer: Self-pay

## 2017-11-15 ENCOUNTER — Ambulatory Visit: Payer: Medicare HMO | Admitting: Rheumatology

## 2017-11-15 ENCOUNTER — Encounter: Payer: Self-pay | Admitting: Rheumatology

## 2017-11-15 VITALS — BP 139/80 | HR 88 | Resp 15 | Ht 62.0 in | Wt 152.0 lb

## 2017-11-15 DIAGNOSIS — J479 Bronchiectasis, uncomplicated: Secondary | ICD-10-CM

## 2017-11-15 DIAGNOSIS — M5030 Other cervical disc degeneration, unspecified cervical region: Secondary | ICD-10-CM | POA: Insufficient documentation

## 2017-11-15 DIAGNOSIS — I10 Essential (primary) hypertension: Secondary | ICD-10-CM

## 2017-11-15 DIAGNOSIS — M79672 Pain in left foot: Secondary | ICD-10-CM | POA: Diagnosis not present

## 2017-11-15 DIAGNOSIS — L639 Alopecia areata, unspecified: Secondary | ICD-10-CM

## 2017-11-15 DIAGNOSIS — N289 Disorder of kidney and ureter, unspecified: Secondary | ICD-10-CM

## 2017-11-15 DIAGNOSIS — M7541 Impingement syndrome of right shoulder: Secondary | ICD-10-CM | POA: Diagnosis not present

## 2017-11-15 DIAGNOSIS — E1169 Type 2 diabetes mellitus with other specified complication: Secondary | ICD-10-CM | POA: Diagnosis not present

## 2017-11-15 DIAGNOSIS — R768 Other specified abnormal immunological findings in serum: Secondary | ICD-10-CM | POA: Diagnosis not present

## 2017-11-15 DIAGNOSIS — Z9889 Other specified postprocedural states: Secondary | ICD-10-CM | POA: Diagnosis not present

## 2017-11-15 DIAGNOSIS — R5383 Other fatigue: Secondary | ICD-10-CM | POA: Diagnosis not present

## 2017-11-15 DIAGNOSIS — M79641 Pain in right hand: Secondary | ICD-10-CM

## 2017-11-15 DIAGNOSIS — E1122 Type 2 diabetes mellitus with diabetic chronic kidney disease: Secondary | ICD-10-CM

## 2017-11-15 DIAGNOSIS — M79671 Pain in right foot: Secondary | ICD-10-CM

## 2017-11-15 DIAGNOSIS — M2550 Pain in unspecified joint: Secondary | ICD-10-CM

## 2017-11-15 DIAGNOSIS — M79642 Pain in left hand: Secondary | ICD-10-CM | POA: Diagnosis not present

## 2017-11-15 DIAGNOSIS — M25562 Pain in left knee: Secondary | ICD-10-CM | POA: Diagnosis not present

## 2017-11-15 DIAGNOSIS — M25561 Pain in right knee: Secondary | ICD-10-CM | POA: Diagnosis not present

## 2017-11-15 DIAGNOSIS — Z87891 Personal history of nicotine dependence: Secondary | ICD-10-CM

## 2017-11-15 DIAGNOSIS — M5136 Other intervertebral disc degeneration, lumbar region: Secondary | ICD-10-CM | POA: Diagnosis not present

## 2017-11-15 DIAGNOSIS — M25511 Pain in right shoulder: Secondary | ICD-10-CM

## 2017-11-15 DIAGNOSIS — G2401 Drug induced subacute dyskinesia: Secondary | ICD-10-CM

## 2017-11-15 DIAGNOSIS — T43505A Adverse effect of unspecified antipsychotics and neuroleptics, initial encounter: Secondary | ICD-10-CM

## 2017-11-15 DIAGNOSIS — F3110 Bipolar disorder, current episode manic without psychotic features, unspecified: Secondary | ICD-10-CM

## 2017-11-15 DIAGNOSIS — G8929 Other chronic pain: Secondary | ICD-10-CM

## 2017-11-15 DIAGNOSIS — Z8639 Personal history of other endocrine, nutritional and metabolic disease: Secondary | ICD-10-CM

## 2017-11-19 NOTE — Progress Notes (Signed)
We will discuss results at the follow-up visit.

## 2017-11-21 ENCOUNTER — Telehealth: Payer: Self-pay | Admitting: Critical Care Medicine

## 2017-11-21 LAB — PROTEIN ELECTROPHORESIS, SERUM, WITH REFLEX
Albumin ELP: 3.9 g/dL (ref 3.8–4.8)
Alpha 1: 0.3 g/dL (ref 0.2–0.3)
Alpha 2: 0.8 g/dL (ref 0.5–0.9)
Beta 2: 0.4 g/dL (ref 0.2–0.5)
Beta Globulin: 0.5 g/dL (ref 0.4–0.6)
Gamma Globulin: 1.3 g/dL (ref 0.8–1.7)
Total Protein: 7.2 g/dL (ref 6.1–8.1)

## 2017-11-21 LAB — HIV ANTIBODY (ROUTINE TESTING W REFLEX): HIV 1&2 Ab, 4th Generation: NONREACTIVE

## 2017-11-21 LAB — HEPATITIS B SURFACE ANTIGEN: Hepatitis B Surface Ag: NONREACTIVE

## 2017-11-21 LAB — URINALYSIS, ROUTINE W REFLEX MICROSCOPIC
Bacteria, UA: NONE SEEN /HPF
Bilirubin Urine: NEGATIVE
Glucose, UA: NEGATIVE
Hgb urine dipstick: NEGATIVE
Hyaline Cast: NONE SEEN /LPF
Ketones, ur: NEGATIVE
Leukocytes, UA: NEGATIVE
Nitrite: NEGATIVE
RBC / HPF: NONE SEEN /HPF (ref 0–2)
Specific Gravity, Urine: 1.028 (ref 1.001–1.03)
WBC, UA: NONE SEEN /HPF (ref 0–5)
pH: 5.5 (ref 5.0–8.0)

## 2017-11-21 LAB — QUANTIFERON-TB GOLD PLUS
Mitogen-NIL: >10 IU/mL
NIL: 0.05 IU/mL
QuantiFERON-TB Gold Plus: NEGATIVE
TB1-NIL: 0.01 IU/mL
TB2-NIL: 0 IU/mL

## 2017-11-21 LAB — CK: Total CK: 164 U/L — ABNORMAL HIGH (ref 29–143)

## 2017-11-21 LAB — IGG, IGA, IGM
IgG (Immunoglobin G), Serum: 1502 mg/dL (ref 694–1618)
IgM, Serum: 49 mg/dL (ref 48–271)
Immunoglobulin A: 184 mg/dL (ref 81–463)

## 2017-11-21 LAB — HEPATITIS B CORE ANTIBODY, IGM: Hep B C IgM: NONREACTIVE

## 2017-11-21 LAB — HEPATITIS C ANTIBODY
Hepatitis C Ab: NONREACTIVE
SIGNAL TO CUT-OFF: 0.15 (ref <1.00)

## 2017-11-21 LAB — CYCLIC CITRUL PEPTIDE ANTIBODY, IGG: Cyclic Citrullin Peptide Ab: <16 UNITS

## 2017-11-21 LAB — IFE INTERPRETATION: Immunofix Electr Int: NOT DETECTED

## 2017-11-21 LAB — GLUCOSE 6 PHOSPHATE DEHYDROGENASE: G-6PDH: 16.8 U/g Hgb (ref 7.0–20.5)

## 2017-11-21 NOTE — Telephone Encounter (Signed)
Returned phone call to patient who is c/o of persistent coughing.  She states this is not new but is bothering her more.  She denies F/C but does have occ SOB and some CP associated with the coughing spells.  The cough is productive of white phlegm.  She has taken all her regular meds but states that she has only taken the Mucinex once.  She was seen recently in pulm clinic 1/2 and was prescribed this medication at that time for her cough and congestion.  Plan: Recommend the patient take the mucinex as prescribed at the last visit.  She agrees to this plan. Also encouraged to contact the pulm clinic if she continues to have issues with cough and congestion despite using the mucinex on a regular basis.

## 2017-11-28 ENCOUNTER — Telehealth: Payer: Self-pay | Admitting: Pulmonary Disease

## 2017-11-28 MED ORDER — DOXYCYCLINE HYCLATE 100 MG PO TABS: 100.0000 mg | ORAL_TABLET | Freq: Two times a day (BID) | ORAL | 0 refills | Status: DC

## 2017-11-28 NOTE — Telephone Encounter (Signed)
Called pt letting her know we were sending a script to her pharmacy and to call us if no improvement.  Rx sent to preferred pharmacy. Nothing further needed.

## 2017-11-28 NOTE — Telephone Encounter (Signed)
Called and spoke with patient, she states that she is coughing up a lot of clear mucous. This has been going on for a few days. Denies fever, cp, body aches. No other symptoms.   BQ please advise patient is requesting a medication for this.

## 2017-11-28 NOTE — Telephone Encounter (Signed)
Doxycycline 100mg  po bid x 7 days Call if no improvement

## 2017-12-03 DIAGNOSIS — N183 Chronic kidney disease, stage 3 (moderate): Secondary | ICD-10-CM | POA: Diagnosis not present

## 2017-12-05 ENCOUNTER — Other Ambulatory Visit: Payer: Self-pay | Admitting: Internal Medicine

## 2017-12-05 ENCOUNTER — Telehealth: Payer: Self-pay | Admitting: Pulmonary Disease

## 2017-12-05 ENCOUNTER — Encounter: Payer: Self-pay | Admitting: Pulmonary Disease

## 2017-12-05 ENCOUNTER — Ambulatory Visit (INDEPENDENT_AMBULATORY_CARE_PROVIDER_SITE_OTHER): Payer: Medicare HMO | Admitting: Pulmonary Disease

## 2017-12-05 VITALS — BP 126/68 | HR 88 | Ht 62.0 in | Wt 144.0 lb

## 2017-12-05 DIAGNOSIS — J479 Bronchiectasis, uncomplicated: Secondary | ICD-10-CM | POA: Diagnosis not present

## 2017-12-05 DIAGNOSIS — R1310 Dysphagia, unspecified: Secondary | ICD-10-CM

## 2017-12-05 DIAGNOSIS — R1319 Other dysphagia: Secondary | ICD-10-CM

## 2017-12-05 MED ORDER — UMECLIDINIUM-VILANTEROL 62.5-25 MCG/INH IN AEPB: 1.0000 | INHALATION_SPRAY | Freq: Every day | RESPIRATORY_TRACT | 5 refills | Status: DC

## 2017-12-05 MED ORDER — IPRATROPIUM-ALBUTEROL 0.5-2.5 (3) MG/3ML IN SOLN: 3.0000 mL | Freq: Three times a day (TID) | RESPIRATORY_TRACT | 5 refills | Status: DC

## 2017-12-05 NOTE — Telephone Encounter (Signed)
Called and spoke with patient answered patients questions regarding flutter valve. Resent prescription to correct pharmacy.

## 2017-12-05 NOTE — Addendum Note (Signed)
Addended by: Velvet Bathe on: 12/05/2017 03:38 PM   Modules accepted: Orders

## 2017-12-05 NOTE — Addendum Note (Signed)
Addended by: Velvet Bathe on: 12/05/2017 11:22 AM   Modules accepted: Orders

## 2017-12-05 NOTE — Progress Notes (Signed)
Subjective:    [name], female    DOB: 1951/03/12, 67 y.o.   MRN: [id number]  Synopsis: Referred in 2018 for evaluation of cough. Smoked 2 PP for 28 years, quit 2003 Has GERD and hypertrophic cardiomyopathy.   Chief Complaint  Patient presents with  . Follow-up    pt c/o sob with exertion, fatigue, prod cough with clear/yellow mucus worse QHS.    She is still undergoing evaluation for rheumatoid arthritis.  She still has some trouble breathing: > worse when coughing, laughing or lying down > Anoro helped when she took it > she still had some congestion and dyspnea when taking the Anoro  Cough: > still producing clear mucus > it rarely has any color to it. > she feels some pain in her chest when she coughs  Sometimes food will go down the wrong pipe.  She doesn't feel like foods get stuck.     Past Medical History:  Diagnosis Date  . Anxiety   . Bipolar disorder ([place])   . CANDIDIASIS, ESOPHAGEAL 07/28/2009   Qualifier: Diagnosis of  By: [name] MD, [name]   . Chronic kidney disease    CKD III  . COPD (chronic obstructive pulmonary disease) ([place])   . Depression   . Diabetes mellitus without complication ([facility])    no meds  . FH: colonic polyps   . Fractured elbow    right   . GERD (gastroesophageal reflux disease)   . HH (hiatus hernia)   . History of carpal tunnel syndrome   . History of chest pain   . History of transfusion of packed red blood cells   . Hyperlipidemia   . Hypertension   . Neuroleptic-induced tardive dyskinesia   . Osteoarthritis of more than one site   . Seasonal allergies       Review of Systems  Constitutional: Negative for fever and unexpected weight change.  HENT: Positive for congestion. Negative for dental problem, ear pain, nosebleeds, postnasal drip, rhinorrhea, sinus pressure, sneezing, sore throat and trouble swallowing.   Respiratory: Positive for cough, chest tightness and shortness of breath. Negative for  wheezing.   Cardiovascular: Negative for palpitations and leg swelling.  Gastrointestinal: Negative for vomiting.  Skin: Negative for rash.       Objective:   Physical Exam  Vitals:   12/05/17 1049  BP: 126/68  Pulse: 88  SpO2: 96%  Weight: 144 lb (65.3 kg)  Height: _0  (1.575 m)    Gen: well appearing HENT: OP clear, TM's clear, neck supple PULM: Some rhonchi bases B, normal percussion CV: RRR, no mgr, trace edema GI: BS+, soft, nontender Derm: no cyanosis or rash Psyche: normal mood and affect    CBC    Component Value Date/Time   WBC 6.9 09/25/2017 1400   RBC 4.20 09/25/2017 1400   HGB 12.5 09/25/2017 1400   HCT 36.9 09/25/2017 1400   PLT 329 09/25/2017 1400   MCV 87.9 09/25/2017 1400   MCH 29.8 09/25/2017 1400   MCHC 33.9 09/25/2017 1400   RDW 13.2 09/25/2017 1400   LYMPHSABS 1.9 08/07/2017 1527   MONOABS 0.6 08/07/2017 1527   EOSABS 0.2 08/07/2017 1527   BASOSABS 0.1 08/07/2017 1527    Labs: December 2018 rheumatoid factor elevated at 127, CCP negative December 2018 serum IgE slightly elevated at 120 kU/L  Chest imaging: August 22, 2017 images independently reviewed showing normal pulmonary lung fields.  January 2019 high-resolution CT chest images independently reviewed showing  no emphysema, there is some mild bronchiectasis and mucous plugging in the bases of both lungs bilaterally  Echocardiogram: June 27, 2017 transthoracic US echocardiogram LVEF 65-70%, severe asymmetric septal hypertrophy grade 1 diastolic dysfunction, right ventricle cavity size normal, systolic function normal  PFT: November 2018 spirometry normal January 2019 ratio normal, FEV1 1.36 L 74% predicted, FVC 1.79 L 76% predicted, total lung capacity 6.19 L 128% predicted, residual volume 79% predicted, DLCO 13.3 (59% predicted) flow volume loop is consistent with small airways obstruction  Records from her visit with rheumatology in January reviewed where she was evaluated  for polyarthralgia in the setting of an elevated rheumatoid factor.  More blood work was obtained to assess for rheumatoid arthritis.  X-rays were also performed     Assessment & Plan:    Bronchiectasis without complication ([place])  Esophageal dysphagia  Discussion: Desiree [name] has lower lobe bronchiectasis.  Considering the dysphasia she describes I do worry about chronic aspiration.  We will get a modified barium swallow to assess further.  She continues to struggle with chest congestion shortness of breath and wheezing.  We will try bronchodilators to see if this helps.  If not she may need hypertonic saline and potentially therapy vest.  She has had symptoms for over a year and continues to use her flutter valve but continues to struggle despite this.  Plan: Bronchiectasis: Take Anoro 1 puff daily no matter how you feel Use DuoNeb 3 times a day Use the flutter valve 4-5 breaths 4-5 times a day  Dysphagia: We will arrange a modified barium swallow test  We will see you back in 6-8 weeks to see how you are doing on this regimen   Current Outpatient Medications:  .  acetaminophen (TYLENOL) 500 MG tablet, Take 2 tablets (1,000 mg total) by mouth every 6 (six) hours as needed. (Patient taking differently: Take 1,000 mg by mouth every 6 (six) hours as needed (for pain/headache.). ), Disp: 30 tablet, Rfl: 0 .  atorvastatin (LIPITOR) 20 MG tablet, take 1 tablet by mouth once daily, Disp: 90 tablet, Rfl: 0 .  Blood Glucose Monitoring Suppl (ACCU-CHEK NANO SMARTVIEW) W/DEVICE KIT, Use to check blood sugar 1-2 times daily (Patient taking differently: 1 strip by Other route 2 (two) times a week. ), Disp: 1 kit, Rfl: 0 .  cetirizine (ZYRTEC) 10 MG tablet, Take 10 mg by mouth at bedtime. , Disp: , Rfl:  .  docusate sodium (COLACE) 100 MG capsule, Take 100 mg by mouth at bedtime. , Disp: , Rfl:  .  DULoxetine (CYMBALTA) 60 MG capsule, Take 1 capsule (60 mg total) by mouth daily. (Patient taking  differently: Take 30 mg by mouth 3 (three) times daily. ), Disp: 30 capsule, Rfl: 0 .  fluticasone (FLONASE) 50 MCG/ACT nasal spray, Place 1-2 sprays into both nostrils at bedtime. , Disp: , Rfl:  .  glucose blood test strip, Accu-chek nano, test glucose once daily, dx E11.9, Disp: 100 each, Rfl: 12 .  lamoTRIgine (LAMICTAL) 25 MG tablet, Take 1 tablet (25 mg total) by mouth 2 (two) times daily., Disp: 14 tablet, Rfl: 0 .  Lancets Misc. (ACCU-CHEK FASTCLIX LANCET) KIT, USE TO CHECK BLOOD SUGAR 1-2 TIMES DAILY, Disp: 1 kit, Rfl: 0 .  losartan (COZAAR) 25 MG tablet, take 1 tablet by mouth once daily, Disp: 90 tablet, Rfl: 1 .  montelukast (SINGULAIR) 10 MG tablet, Take 1 tablet (10 mg total) at bedtime by mouth., Disp: 30 tablet, Rfl: 11 .  Multiple Vitamins-Minerals (CENTRUM  SILVER PO), Take 1 tablet by mouth daily. , Disp: , Rfl:  .  ondansetron (ZOFRAN-ODT) 4 MG disintegrating tablet, Take 1 tablet (4 mg total) by mouth every 8 (eight) hours as needed for nausea or vomiting., Disp: 20 tablet, Rfl: 0 .  OVER THE COUNTER MEDICATION, Take 1 capsule by mouth every morning. IBgard & Fgard, Disp: , Rfl:  .  pantoprazole (PROTONIX) 40 MG tablet, Take 40 mg by mouth daily., Disp: , Rfl:  .  Probiotic Product (ALIGN PO), Take 1 capsule by mouth at bedtime. Align, Disp: , Rfl:  .  Respiratory Therapy Supplies (FLUTTER) DEVI, 1 Device by Does not apply route as directed., Disp: 1 each, Rfl: 0 .  umeclidinium-vilanterol (ANORO ELLIPTA) 62.5-25 MCG/INH AEPB, Inhale 1 puff into the lungs daily., Disp: 1 each, Rfl: 0

## 2017-12-05 NOTE — Patient Instructions (Signed)
Bronchiectasis: Take Anoro 1 puff daily no matter how you feel Use DuoNeb 3 times a day Use the flutter valve 4-5 breaths 4-5 times a day  Dysphagia: We will arrange a modified barium swallow test  We will see you back in 6-8 weeks to see how you are doing on this regimen

## 2017-12-06 DIAGNOSIS — J479 Bronchiectasis, uncomplicated: Secondary | ICD-10-CM | POA: Diagnosis not present

## 2017-12-06 DIAGNOSIS — R269 Unspecified abnormalities of gait and mobility: Secondary | ICD-10-CM | POA: Diagnosis not present

## 2017-12-07 ENCOUNTER — Other Ambulatory Visit (HOSPITAL_COMMUNITY): Payer: Self-pay | Admitting: Pulmonary Disease

## 2017-12-07 DIAGNOSIS — R1310 Dysphagia, unspecified: Secondary | ICD-10-CM

## 2017-12-13 DIAGNOSIS — N2581 Secondary hyperparathyroidism of renal origin: Secondary | ICD-10-CM | POA: Diagnosis not present

## 2017-12-13 DIAGNOSIS — I129 Hypertensive chronic kidney disease with stage 1 through stage 4 chronic kidney disease, or unspecified chronic kidney disease: Secondary | ICD-10-CM | POA: Diagnosis not present

## 2017-12-13 DIAGNOSIS — N183 Chronic kidney disease, stage 3 (moderate): Secondary | ICD-10-CM | POA: Diagnosis not present

## 2017-12-13 DIAGNOSIS — D631 Anemia in chronic kidney disease: Secondary | ICD-10-CM | POA: Diagnosis not present

## 2017-12-13 LAB — BASIC METABOLIC PANEL
BUN: 18 (ref 4–21)
Creatinine: 1.2 — AB (ref 0.5–1.1)
Glucose: 100
Potassium: 3.3 — AB (ref 3.4–5.3)
Sodium: 138 (ref 137–147)

## 2017-12-13 LAB — CBC AND DIFFERENTIAL
HCT: 39 (ref 36–46)
Hemoglobin: 13.3 (ref 12.0–16.0)

## 2017-12-15 ENCOUNTER — Other Ambulatory Visit: Payer: Self-pay | Admitting: Internal Medicine

## 2017-12-18 ENCOUNTER — Ambulatory Visit (HOSPITAL_COMMUNITY)
Admission: RE | Admit: 2017-12-18 | Discharge: 2017-12-18 | Disposition: A | Discharge status: Home / Self Care | Payer: Medicare HMO | Source: Ambulatory Visit | Attending: Pulmonary Disease | Admitting: Pulmonary Disease

## 2017-12-18 DIAGNOSIS — R1319 Other dysphagia: Secondary | ICD-10-CM

## 2017-12-18 DIAGNOSIS — R05 Cough: Secondary | ICD-10-CM | POA: Diagnosis not present

## 2017-12-18 DIAGNOSIS — R0600 Dyspnea, unspecified: Secondary | ICD-10-CM | POA: Insufficient documentation

## 2017-12-18 DIAGNOSIS — R1310 Dysphagia, unspecified: Secondary | ICD-10-CM

## 2017-12-18 NOTE — Progress Notes (Signed)
Modified Barium Swallow Progress Note  Patient Details  Name: Desiree [name] MRN: [id number] Date of Birth: [birth year]  Today's Date: 12/18/2017  Modified Barium Swallow completed.  Full report located under Chart Review in the Imaging Section.  Brief recommendations include the following:  Clinical Impression  Pt with functional oropharyngeal abilities with no aspiration or phayrngeal residue with consumption of thin liquids via cups or straw sips. Pt consumed mixed consistency and barium tablet whole without any aspiration. Given pt's history of reflux, continue to recommend following strict reflux precuations. Pt able to independently state reflux precuations.    Swallow Evaluation Recommendations       SLP Diet Recommendations: Regular solids;Thin liquid   Liquid Administration via: Cup;Straw   Medication Administration: Whole meds with liquid   Supervision: Patient able to self feed       Postural Changes: Seated upright at 90 degrees;Remain semi-upright after after feeds/meals (Comment)            Richardson Dubree 12/18/2017,11:50 AM

## 2017-12-20 DIAGNOSIS — K5900 Constipation, unspecified: Secondary | ICD-10-CM | POA: Diagnosis not present

## 2017-12-20 DIAGNOSIS — K219 Gastro-esophageal reflux disease without esophagitis: Secondary | ICD-10-CM | POA: Diagnosis not present

## 2017-12-20 DIAGNOSIS — R140 Abdominal distension (gaseous): Secondary | ICD-10-CM | POA: Diagnosis not present

## 2017-12-21 DIAGNOSIS — M19072 Primary osteoarthritis, left ankle and foot: Secondary | ICD-10-CM | POA: Insufficient documentation

## 2017-12-21 DIAGNOSIS — Z9889 Other specified postprocedural states: Secondary | ICD-10-CM | POA: Insufficient documentation

## 2017-12-21 DIAGNOSIS — M5136 Other intervertebral disc degeneration, lumbar region: Secondary | ICD-10-CM | POA: Insufficient documentation

## 2017-12-21 DIAGNOSIS — M170 Bilateral primary osteoarthritis of knee: Secondary | ICD-10-CM | POA: Insufficient documentation

## 2017-12-21 DIAGNOSIS — M19071 Primary osteoarthritis, right ankle and foot: Secondary | ICD-10-CM | POA: Insufficient documentation
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
## 2017-12-24 ENCOUNTER — Telehealth: Payer: Self-pay | Admitting: *Deleted

## 2017-12-24 DIAGNOSIS — F314 Bipolar disorder, current episode depressed, severe, without psychotic features: Secondary | ICD-10-CM | POA: Diagnosis not present

## 2017-12-24 NOTE — Telephone Encounter (Signed)
There are openings on this  Wednesday  12 or 12 30 ?

## 2017-12-24 NOTE — Telephone Encounter (Signed)
Please advise regarding scheduling

## 2017-12-24 NOTE — Telephone Encounter (Signed)
Copied from CRM [id number]. Topic: Appointment Scheduling - Scheduling Inquiry for Clinic >> Dec 24, 2017 11:39 AM Floria Raveling A wrote: Reason for CRM: pt called in and is loosing weight rapidly and needs to be seen in the next few days.  There were no 30 mins appt available .  Can this pt be worked in to see Dr Fabian Sharp? Please advise   Best number [phone]

## 2017-12-24 NOTE — Telephone Encounter (Signed)
Called patient and left message to return call

## 2017-12-25 NOTE — Telephone Encounter (Signed)
Patient has been scheduled
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
## 2017-12-26 ENCOUNTER — Encounter: Payer: Self-pay | Admitting: Internal Medicine

## 2017-12-26 ENCOUNTER — Ambulatory Visit (INDEPENDENT_AMBULATORY_CARE_PROVIDER_SITE_OTHER): Payer: Medicare HMO | Admitting: Internal Medicine

## 2017-12-26 VITALS — BP 140/82 | HR 100 | Temp 98.1°F | Wt 144.8 lb

## 2017-12-26 DIAGNOSIS — N289 Disorder of kidney and ureter, unspecified: Secondary | ICD-10-CM | POA: Diagnosis not present

## 2017-12-26 DIAGNOSIS — R630 Anorexia: Secondary | ICD-10-CM | POA: Diagnosis not present

## 2017-12-26 DIAGNOSIS — R634 Abnormal weight loss: Secondary | ICD-10-CM

## 2017-12-26 DIAGNOSIS — Z634 Disappearance and death of family member: Secondary | ICD-10-CM | POA: Diagnosis not present

## 2017-12-26 DIAGNOSIS — E1122 Type 2 diabetes mellitus with diabetic chronic kidney disease: Secondary | ICD-10-CM | POA: Diagnosis not present

## 2017-12-26 LAB — POCT GLYCOSYLATED HEMOGLOBIN (HGB A1C): Hemoglobin A1C: 5.9

## 2017-12-26 NOTE — Patient Instructions (Signed)
Please make appt with hospice counselor  As we discussed .  Mental energy  Can be drained  From the stress you are experiencing.  Try peanut butter snack to add healthy calories .  Medications to increase appetite are not that great and can have  Med interactions.  Increase the cymbalta as planned    And see  Hospice counselor  First   Then plan FU visit to  Decide  If other information needed. Will  Send dr Durenda Age information  And dr Loreta Ave to se iff they have any other imput about the  Lack of energy and   Weight loss and   Dec appetitie.

## 2018-01-01 ENCOUNTER — Encounter: Payer: Self-pay | Admitting: Rheumatology

## 2018-01-01 ENCOUNTER — Ambulatory Visit: Payer: Medicare HMO | Admitting: Rheumatology

## 2018-01-01 VITALS — BP 122/75 | HR 108 | Resp 14 | Ht 62.0 in | Wt 149.5 lb

## 2018-01-01 DIAGNOSIS — Z9889 Other specified postprocedural states: Secondary | ICD-10-CM | POA: Diagnosis not present

## 2018-01-01 DIAGNOSIS — M5030 Other cervical disc degeneration, unspecified cervical region: Secondary | ICD-10-CM

## 2018-01-01 DIAGNOSIS — M1990 Unspecified osteoarthritis, unspecified site: Secondary | ICD-10-CM | POA: Diagnosis not present

## 2018-01-01 DIAGNOSIS — E782 Mixed hyperlipidemia: Secondary | ICD-10-CM | POA: Diagnosis not present

## 2018-01-01 DIAGNOSIS — M7541 Impingement syndrome of right shoulder: Secondary | ICD-10-CM | POA: Diagnosis not present

## 2018-01-01 DIAGNOSIS — E669 Obesity, unspecified: Secondary | ICD-10-CM

## 2018-01-01 DIAGNOSIS — M170 Bilateral primary osteoarthritis of knee: Secondary | ICD-10-CM

## 2018-01-01 DIAGNOSIS — M19071 Primary osteoarthritis, right ankle and foot: Secondary | ICD-10-CM | POA: Diagnosis not present

## 2018-01-01 DIAGNOSIS — M19072 Primary osteoarthritis, left ankle and foot: Secondary | ICD-10-CM

## 2018-01-01 DIAGNOSIS — M19042 Primary osteoarthritis, left hand: Secondary | ICD-10-CM

## 2018-01-01 DIAGNOSIS — M19041 Primary osteoarthritis, right hand: Secondary | ICD-10-CM

## 2018-01-01 DIAGNOSIS — L639 Alopecia areata, unspecified: Secondary | ICD-10-CM

## 2018-01-01 DIAGNOSIS — I10 Essential (primary) hypertension: Secondary | ICD-10-CM

## 2018-01-01 DIAGNOSIS — M5136 Other intervertebral disc degeneration, lumbar region: Secondary | ICD-10-CM

## 2018-01-01 DIAGNOSIS — Z87891 Personal history of nicotine dependence: Secondary | ICD-10-CM

## 2018-01-01 DIAGNOSIS — F3110 Bipolar disorder, current episode manic without psychotic features, unspecified: Secondary | ICD-10-CM

## 2018-01-01 DIAGNOSIS — E1122 Type 2 diabetes mellitus with diabetic chronic kidney disease: Secondary | ICD-10-CM

## 2018-01-03 ENCOUNTER — Encounter: Payer: Self-pay | Admitting: Internal Medicine

## 2018-01-03 DIAGNOSIS — R269 Unspecified abnormalities of gait and mobility: Secondary | ICD-10-CM | POA: Diagnosis not present

## 2018-01-03 DIAGNOSIS — J479 Bronchiectasis, uncomplicated: Secondary | ICD-10-CM | POA: Diagnosis not present

## 2018-01-14 ENCOUNTER — Telehealth: Payer: Self-pay | Admitting: Internal Medicine

## 2018-01-14 NOTE — Telephone Encounter (Signed)
   I dont see a medication  That obviously would cause weight loss.   see weights in out system  Wt Readings from Last 3 Encounters:  01/01/18 149 lb 8 oz (67.8 kg)  12/26/17 144 lb 12.8 oz (65.7 kg)  12/05/17 144 lb (65.3 kg)    Depression and grief could cause weight loss but cant say that is the culprit . Fu appt  As planned in late April early May  And keep [place] informed .

## 2018-01-14 NOTE — Telephone Encounter (Signed)
Copied from CRM [id number]. Topic: Quick Communication - See Telephone Encounter >> Jan 14, 2018  3:35 PM Oneal Grout wrote: CRM for notification. See Telephone encounter for: 01/14/18. Patient would like to know if any of her meds cause weight loss and fatigue. Last seen on 12/26/17 and states she has lost 2 or 3 lbs

## 2018-01-14 NOTE — Telephone Encounter (Signed)
Please advise Dr [name], thanks.   

## 2018-01-16 ENCOUNTER — Ambulatory Visit: Payer: Medicare HMO | Admitting: Pulmonary Disease
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
## 2018-01-18 ENCOUNTER — Encounter: Payer: Self-pay | Admitting: Pulmonary Disease

## 2018-01-18 ENCOUNTER — Ambulatory Visit: Payer: Medicare HMO | Admitting: Pulmonary Disease

## 2018-01-18 VITALS — BP 138/72 | HR 97 | Ht 62.0 in | Wt 151.0 lb

## 2018-01-18 DIAGNOSIS — J301 Allergic rhinitis due to pollen: Secondary | ICD-10-CM | POA: Diagnosis not present

## 2018-01-18 DIAGNOSIS — R1310 Dysphagia, unspecified: Secondary | ICD-10-CM | POA: Diagnosis not present

## 2018-01-18 DIAGNOSIS — J479 Bronchiectasis, uncomplicated: Secondary | ICD-10-CM

## 2018-01-18 DIAGNOSIS — R0600 Dyspnea, unspecified: Secondary | ICD-10-CM

## 2018-01-18 DIAGNOSIS — R1319 Other dysphagia: Secondary | ICD-10-CM

## 2018-01-18 MED ORDER — SODIUM CHLORIDE 3 % IN NEBU: INHALATION_SOLUTION | Freq: Two times a day (BID) | RESPIRATORY_TRACT | 5 refills | Status: DC

## 2018-01-18 NOTE — Patient Instructions (Signed)
Bronchiectasis: This is the name of the lung condition you have.  It means that your airways are bigger and more dilated than normal and are more susceptible to infection.  In order to treat this you need to provide [place] with a sample of your mucus so that we can understand which organisms live in your lungs.  Second you need to do a good job of helping clean out the mucus every day.  To help with this were going to have you take hypertonic saline which is salt water solutions through nebulizers twice a day.  We are also going to have you use something called a therapy vest to help get the mucus out.  Allergic rhinitis: Continue Zyrtec Continue Singulair Continue Flonase Start using saline rinses with Lloyd Huger med rinse twice a day If you are still having trouble with this on the next visit we may need to have you see an allergist or go back to see Dr. Ezzard Standing again  Wheezing/possible COPD: Continue taking Anoro once a day as you are doing  We will see you back in 6-8 weeks after you have started using the therapy vest.

## 2018-01-18 NOTE — Progress Notes (Signed)
Subjective:    [name], female    DOB: Sep 29, 1951, 67 y.o.   MRN: [id number]  Synopsis: Referred in 2018 for evaluation of cough, found to have bronchiectasis on a CT scan of the chest in December 2018.  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  A modified barium swallow test performed in early 2019 showed some penetration of thin liquids to the piriform sinus but no laryngeal penetration or aspiration. Smoked 2 PP for 28 years, quit 2003 Has GERD and hypertrophic cardiomyopathy.   Chief Complaint  Patient presents with  . Follow-up    review barium swallow.  pt c/o unchanged prod cough with thick white mucus.     She says that around 3AM she has been having problems coughing up thick mucus.  She will sometimes cough for hours.  She knows that sometimes it will come from her chest.  She has a lot of sinus congestion and mucus.  She is taking a nasal spray at night right now.  She is taking montelukast.   She reports that she still bringing up quite a bit of mucus in the mornings.  She says that she has significant hand stiffness and swelling in the mornings and she is following with Dr. [name] for this.   Past Medical History:  Diagnosis Date  . Anxiety   . Bipolar disorder ([place])   . CANDIDIASIS, ESOPHAGEAL 07/28/2009   Qualifier: Diagnosis of  By: [name] MD, [name]   . Chronic kidney disease    CKD III  . COPD (chronic obstructive pulmonary disease) ([place])   . Depression   . Diabetes mellitus without complication (HCC)    no meds  . FH: colonic polyps   . Fractured elbow    right   . GERD (gastroesophageal reflux disease)   . HH (hiatus hernia)   . History of carpal tunnel syndrome   . History of chest pain   . History of transfusion of packed red blood cells   . Hyperlipidemia   . Hypertension   . Neuroleptic-induced tardive dyskinesia   . Osteoarthritis of more than one site   . Seasonal allergies        Review of Systems  Constitutional: Negative for fever and unexpected weight change.  HENT: Positive for congestion. Negative for dental problem, ear pain, nosebleeds, postnasal drip, rhinorrhea, sinus pressure, sneezing, sore throat and trouble swallowing.   Respiratory: Positive for cough, chest tightness and shortness of breath. Negative for wheezing.   Cardiovascular: Negative for palpitations and leg swelling.  Gastrointestinal: Negative for vomiting.  Skin: Negative for rash.       Objective:   Physical Exam  Vitals:   01/18/18 0912  BP: 138/72  Pulse: 97  SpO2: 97%  Weight: 151 lb (68.5 kg)  Height: _0  (1.575 m)    Gen: well appearing HENT: OP clear, TM's clear, neck supple PULM: wheezing bilaterally B, normal percussion CV: RRR, no mgr, trace edema GI: BS+, soft, nontender Derm: no cyanosis or rash Psyche: normal mood and affect     CBC    Component Value Date/Time   WBC 6.9 09/25/2017 1400   RBC 4.20 09/25/2017 1400   HGB 13.3 12/13/2017   HCT 39 12/13/2017   PLT 329 09/25/2017 1400   MCV 87.9 09/25/2017 1400   MCH 29.8 09/25/2017 1400   MCHC 33.9 09/25/2017 1400   RDW 13.2 09/25/2017 1400   LYMPHSABS 1.9 08/07/2017 1527  MONOABS 0.6 08/07/2017 1527   EOSABS 0.2 08/07/2017 1527   BASOSABS 0.1 08/07/2017 1527    Labs: December 2018 rheumatoid factor elevated at 127, CCP negative December 2018 serum IgE slightly elevated at 120 kU/L  Chest imaging: August 22, 2017 images independently reviewed showing normal pulmonary lung fields.  January 2019 high-resolution CT chest images independently reviewed showing no emphysema, there is some mild bronchiectasis and mucous plugging in the bases of both lungs bilaterally  Other imaging: February 2019 barium swallow showed premature spill into the piriform sinuses with thin liquid but no evidence of aspiration  Echocardiogram: June 27, 2017 transthoracic US echocardiogram LVEF 65-70%,  severe asymmetric septal hypertrophy grade 1 diastolic dysfunction, right ventricle cavity size normal, systolic function normal  PFT: November 2018 spirometry normal January 2019 ratio normal, FEV1 1.36 L 74% predicted, FVC 1.79 L 76% predicted, total lung capacity 6.19 L 128% predicted, residual volume 79% predicted, DLCO 13.3 (59% predicted) flow volume loop is consistent with small airways obstruction  Records from her visit with rheumatology in January reviewed where she was evaluated for polyarthralgia in the setting of an elevated rheumatoid factor.  More blood work was obtained to assess for rheumatoid arthritis.  X-rays were also performed     Assessment & Plan:    Bronchiectasis without complication ([place]) - Plan: Ambulatory Referral for DME, Fungus Culture & Smear, Respiratory or Resp and Sputum Culture, AFB Culture & Smear  Esophageal dysphagia  Dyspnea, unspecified type  Allergic rhinitis due to pollen, unspecified seasonality  Discussion: [name] continues to struggle with chest congestion, nightly mucus production and lack of sleep due to the severity of her cough.  She has been compliant with her flutter valve and has been experiencing these symptoms now for over a year.  She has bronchiectasis in the bases of her lungs which I have personally reviewed on imaging again today from the December 2018 CT scan of the chest.  At this point I think we can help her by enhancing mucociliary clearance by having her take hypertonic saline nebulizers twice a day and starting to use a therapy vest.  I wonder if her bronchiectasis is somehow related to the likely rheumatoid arthritis.  Management would not really be any different.  The modified barium swallow test was negative.  She does continue to have significant allergic rhinitis and postnasal drip despite adherence to a good regimen.  On top of all this I think she has some degree of a COPD type physiology because of her extensive  smoking history.  Plan: Bronchiectasis: This is the name of the lung condition you have.  It means that your airways are bigger and more dilated than normal and are more susceptible to infection.  In order to treat this you need to provide [place] with a sample of your mucus so that we can understand which organisms live in your lungs.  Second you need to do a good job of helping clean out the mucus every day.  To help with this were going to have you take hypertonic saline which is salt water solutions through nebulizers twice a day.  We are also going to have you use something called a therapy vest to help get the mucus out.  Allergic rhinitis: Continue Zyrtec Continue Singulair Continue Flonase Start using saline rinses with Milta Deiters med rinse twice a day If you are still having trouble with this on the next visit we may need to have you see an allergist or go back to  see Dr. Lucia Gaskins again  Wheezing/possible COPD: Continue taking Anoro once a day as you are doing  We will see you back in 6-8 weeks after you have started using the therapy vest.  Current Outpatient Medications:  .  acetaminophen (TYLENOL) 500 MG tablet, Take 2 tablets (1,000 mg total) by mouth every 6 (six) hours as needed. (Patient taking differently: Take 1,000 mg by mouth every 6 (six) hours as needed (for pain/headache.). ), Disp: 30 tablet, Rfl: 0 .  atorvastatin (LIPITOR) 20 MG tablet, TAKE 1 TABLET BY MOUTH ONCE DAILY, Disp: 90 tablet, Rfl: 1 .  Blood Glucose Monitoring Suppl (ACCU-CHEK NANO SMARTVIEW) W/DEVICE KIT, Use to check blood sugar 1-2 times daily (Patient taking differently: 1 strip by Other route 2 (two) times a week. ), Disp: 1 kit, Rfl: 0 .  cetirizine (ZYRTEC) 10 MG tablet, Take 10 mg by mouth at bedtime. , Disp: , Rfl:  .  cholecalciferol (VITAMIN D) 1000 units tablet, Take 1,000 Units by mouth daily., Disp: , Rfl:  .  docusate sodium (COLACE) 100 MG capsule, Take 100 mg by mouth at bedtime. , Disp: , Rfl:  .   DULoxetine (CYMBALTA) 60 MG capsule, Take 1 capsule (60 mg total) by mouth daily. (Patient taking differently: Take 30 mg by mouth 3 (three) times daily. ), Disp: 30 capsule, Rfl: 0 .  fluticasone (FLONASE) 50 MCG/ACT nasal spray, Place 1-2 sprays into both nostrils at bedtime. , Disp: , Rfl:  .  glucose blood test strip, Accu-chek nano, test glucose once daily, dx E11.9, Disp: 100 each, Rfl: 12 .  ipratropium-albuterol (DUONEB) 0.5-2.5 (3) MG/3ML SOLN, Take 3 mLs by nebulization 3 (three) times daily., Disp: 360 mL, Rfl: 5 .  lamoTRIgine (LAMICTAL) 25 MG tablet, Take 1 tablet (25 mg total) by mouth 2 (two) times daily., Disp: 14 tablet, Rfl: 0 .  Lancets Misc. (ACCU-CHEK FASTCLIX LANCET) KIT, USE TO CHECK BLOOD SUGAR 1-2 TIMES DAILY, Disp: 1 kit, Rfl: 0 .  Linaclotide (LINZESS PO), Take by mouth daily., Disp: , Rfl:  .  losartan (COZAAR) 25 MG tablet, take 1 tablet by mouth once daily, Disp: 90 tablet, Rfl: 1 .  montelukast (SINGULAIR) 10 MG tablet, Take 1 tablet (10 mg total) at bedtime by mouth., Disp: 30 tablet, Rfl: 11 .  Multiple Vitamins-Minerals (CENTRUM SILVER PO), Take 1 tablet by mouth daily. , Disp: , Rfl:  .  ondansetron (ZOFRAN-ODT) 4 MG disintegrating tablet, Take 1 tablet (4 mg total) by mouth every 8 (eight) hours as needed for nausea or vomiting., Disp: 20 tablet, Rfl: 0 .  OVER THE COUNTER MEDICATION, Take 1 capsule by mouth every morning. IBgard & Fgard, Disp: , Rfl:  .  pantoprazole (PROTONIX) 40 MG tablet, Take 40 mg by mouth daily., Disp: , Rfl:  .  Probiotic Product (ALIGN PO), Take 1 capsule by mouth at bedtime. Align, Disp: , Rfl:  .  Respiratory Therapy Supplies (FLUTTER) DEVI, 1 Device by Does not apply route as directed., Disp: 1 each, Rfl: 0 .  umeclidinium-vilanterol (ANORO ELLIPTA) 62.5-25 MCG/INH AEPB, Inhale 1 puff into the lungs daily., Disp: 60 each, Rfl: 5 .  vitamin E 100 UNIT capsule, Take 100 Units by mouth daily., Disp: , Rfl:  .  sodium chloride HYPERTONIC 3  % nebulizer solution, Take by nebulization 2 (two) times daily., Disp: 750 mL, Rfl: 5

## 2018-01-21 ENCOUNTER — Other Ambulatory Visit: Payer: Medicare HMO

## 2018-01-21 DIAGNOSIS — J479 Bronchiectasis, uncomplicated: Secondary | ICD-10-CM

## 2018-01-21 DIAGNOSIS — F314 Bipolar disorder, current episode depressed, severe, without psychotic features: Secondary | ICD-10-CM | POA: Diagnosis not present

## 2018-01-22 NOTE — Telephone Encounter (Signed)
LM for patient

## 2018-01-22 NOTE — Telephone Encounter (Signed)
Pt aware of rec's per Dr [name].  Nothing further needed.  

## 2018-01-23 ENCOUNTER — Ambulatory Visit (INDEPENDENT_AMBULATORY_CARE_PROVIDER_SITE_OTHER): Payer: Self-pay

## 2018-01-23 ENCOUNTER — Ambulatory Visit: Payer: Medicare HMO | Admitting: Rheumatology

## 2018-01-23 DIAGNOSIS — M79641 Pain in right hand: Secondary | ICD-10-CM

## 2018-01-23 DIAGNOSIS — M79642 Pain in left hand: Secondary | ICD-10-CM | POA: Diagnosis not present

## 2018-01-23 NOTE — Progress Notes (Signed)
Ultrasound examination of bilateral hands was performed per EULAR recommendations. Using 12 MHz transducer, grayscale and power Doppler bilateral second, third, and fifth MCP joints and bilateral wrist joints both dorsal and volar aspects were evaluated to look for synovitis or tenosynovitis. The findings were there was no synovitis or tenosynovitis on ultrasound examination. Right median nerve was 0.15 cm squares which was more than upper limits of normal and left median nerve was 0.18 cm squares which was more than upper limits of normal.  Impression: Ultrasound examination did not show any synovitis or tenosynovitis.  Her bilateral median nerves were enlarged.  She does not have any symptoms of carpal tunnel syndrome. [name], MD

## 2018-01-25 DIAGNOSIS — F314 Bipolar disorder, current episode depressed, severe, without psychotic features: Secondary | ICD-10-CM | POA: Diagnosis not present

## 2018-01-29 ENCOUNTER — Ambulatory Visit: Payer: Medicare HMO | Admitting: Rheumatology

## 2018-01-29 ENCOUNTER — Encounter: Payer: Self-pay | Admitting: Rheumatology

## 2018-01-29 VITALS — BP 136/80 | HR 90 | Resp 15 | Ht 62.0 in | Wt 150.0 lb

## 2018-01-29 DIAGNOSIS — M170 Bilateral primary osteoarthritis of knee: Secondary | ICD-10-CM

## 2018-01-29 DIAGNOSIS — Z8639 Personal history of other endocrine, nutritional and metabolic disease: Secondary | ICD-10-CM | POA: Diagnosis not present

## 2018-01-29 DIAGNOSIS — R768 Other specified abnormal immunological findings in serum: Secondary | ICD-10-CM | POA: Diagnosis not present

## 2018-01-29 DIAGNOSIS — M19071 Primary osteoarthritis, right ankle and foot: Secondary | ICD-10-CM | POA: Diagnosis not present

## 2018-01-29 DIAGNOSIS — M19041 Primary osteoarthritis, right hand: Secondary | ICD-10-CM

## 2018-01-29 DIAGNOSIS — L639 Alopecia areata, unspecified: Secondary | ICD-10-CM | POA: Diagnosis not present

## 2018-01-29 DIAGNOSIS — M5136 Other intervertebral disc degeneration, lumbar region: Secondary | ICD-10-CM | POA: Diagnosis not present

## 2018-01-29 DIAGNOSIS — M19072 Primary osteoarthritis, left ankle and foot: Secondary | ICD-10-CM

## 2018-01-29 DIAGNOSIS — R7689 Other specified abnormal immunological findings in serum: Secondary | ICD-10-CM

## 2018-01-29 DIAGNOSIS — M5030 Other cervical disc degeneration, unspecified cervical region: Secondary | ICD-10-CM

## 2018-01-29 DIAGNOSIS — Z8679 Personal history of other diseases of the circulatory system: Secondary | ICD-10-CM

## 2018-01-29 DIAGNOSIS — M7541 Impingement syndrome of right shoulder: Secondary | ICD-10-CM | POA: Diagnosis not present

## 2018-01-29 DIAGNOSIS — Z8659 Personal history of other mental and behavioral disorders: Secondary | ICD-10-CM

## 2018-01-29 DIAGNOSIS — Z9889 Other specified postprocedural states: Secondary | ICD-10-CM | POA: Diagnosis not present

## 2018-01-29 DIAGNOSIS — Z87891 Personal history of nicotine dependence: Secondary | ICD-10-CM

## 2018-01-29 DIAGNOSIS — M19042 Primary osteoarthritis, left hand: Secondary | ICD-10-CM

## 2018-01-29 DIAGNOSIS — M51369 Other intervertebral disc degeneration, lumbar region without mention of lumbar back pain or lower extremity pain: Secondary | ICD-10-CM

## 2018-01-29 MED ORDER — DICLOFENAC SODIUM 1 % TD GEL: TRANSDERMAL | 3 refills | Status: DC

## 2018-01-29 NOTE — Patient Instructions (Signed)
Knee Exercises Ask your health care provider which exercises are safe for you. Do exercises exactly as told by your health care provider and adjust them as directed. It is normal to feel mild stretching, pulling, tightness, or discomfort as you do these exercises, but you should stop right away if you feel sudden pain or your pain gets worse.Do not begin these exercises until told by your health care provider. STRETCHING AND RANGE OF MOTION EXERCISES These exercises warm up your muscles and joints and improve the movement and flexibility of your knee. These exercises also help to relieve pain, numbness, and tingling. Exercise A: Knee Extension, Prone 1. Lie on your abdomen on a bed. 2. Place your left / right knee just beyond the edge of the surface so your knee is not on the bed. You can put a towel under your left / right thigh just above your knee for comfort. 3. Relax your leg muscles and allow gravity to straighten your knee. You should feel a stretch behind your left / right knee. 4. Hold this position for __________ seconds. 5. Scoot up so your knee is supported between repetitions. Repeat __________ times. Complete this stretch __________ times a day. Exercise B: Knee Flexion, Active  1. Lie on your back with both knees straight. If this causes back discomfort, bend your left / right knee so your foot is flat on the floor. 2. Slowly slide your left / right heel back toward your buttocks until you feel a gentle stretch in the front of your knee or thigh. 3. Hold this position for __________ seconds. 4. Slowly slide your left / right heel back to the starting position. Repeat __________ times. Complete this exercise __________ times a day. Exercise C: Quadriceps, Prone  1. Lie on your abdomen on a firm surface, such as a bed or padded floor. 2. Bend your left / right knee and hold your ankle. If you cannot reach your ankle or pant leg, loop a belt around your foot and grab the belt  instead. 3. Gently pull your heel toward your buttocks. Your knee should not slide out to the side. You should feel a stretch in the front of your thigh and knee. 4. Hold this position for __________ seconds. Repeat __________ times. Complete this stretch __________ times a day. Exercise D: Hamstring, Supine 1. Lie on your back. 2. Loop a belt or towel over the ball of your left / right foot. The ball of your foot is on the walking surface, right under your toes. 3. Straighten your left / right knee and slowly pull on the belt to raise your leg until you feel a gentle stretch behind your knee. ? Do not let your left / right knee bend while you do this. ? Keep your other leg flat on the floor. 4. Hold this position for __________ seconds. Repeat __________ times. Complete this stretch __________ times a day. STRENGTHENING EXERCISES These exercises build strength and endurance in your knee. Endurance is the ability to use your muscles for a long time, even after they get tired. Exercise E: Quadriceps, Isometric  1. Lie on your back with your left / right leg extended and your other knee bent. Put a rolled towel or small pillow under your knee if told by your health care provider. 2. Slowly tense the muscles in the front of your left / right thigh. You should see your kneecap slide up toward your hip or see increased dimpling just above the knee. This   motion will push the back of the knee toward the floor. 3. For __________ seconds, keep the muscle as tight as you can without increasing your pain. 4. Relax the muscles slowly and completely. Repeat __________ times. Complete this exercise __________ times a day. Exercise F: Straight Leg Raises - Quadriceps 1. Lie on your back with your left / right leg extended and your other knee bent. 2. Tense the muscles in the front of your left / right thigh. You should see your kneecap slide up or see increased dimpling just above the knee. Your thigh may  even shake a bit. 3. Keep these muscles tight as you raise your leg 4-6 inches (10-15 cm) off the floor. Do not let your knee bend. 4. Hold this position for __________ seconds. 5. Keep these muscles tense as you lower your leg. 6. Relax your muscles slowly and completely after each repetition. Repeat __________ times. Complete this exercise __________ times a day. Exercise G: Hamstring, Isometric 1. Lie on your back on a firm surface. 2. Bend your left / right knee approximately __________ degrees. 3. Dig your left / right heel into the surface as if you are trying to pull it toward your buttocks. Tighten the muscles in the back of your thighs to dig as hard as you can without increasing any pain. 4. Hold this position for __________ seconds. 5. Release the tension gradually and allow your muscles to relax completely for __________ seconds after each repetition. Repeat __________ times. Complete this exercise __________ times a day. Exercise H: Hamstring Curls  If told by your health care provider, do this exercise while wearing ankle weights. Begin with __________ weights. Then increase the weight by 1 lb (0.5 kg) increments. Do not wear ankle weights that are more than __________. 1. Lie on your abdomen with your legs straight. 2. Bend your left / right knee as far as you can without feeling pain. Keep your hips flat against the floor. 3. Hold this position for __________ seconds. 4. Slowly lower your leg to the starting position.  Repeat __________ times. Complete this exercise __________ times a day. Exercise I: Squats (Quadriceps) 1. Stand in front of a table, with your feet and knees pointing straight ahead. You may rest your hands on the table for balance but not for support. 2. Slowly bend your knees and lower your hips like you are going to sit in a chair. ? Keep your weight over your heels, not over your toes. ? Keep your lower legs upright so they are parallel with the table  legs. ? Do not let your hips go lower than your knees. ? Do not bend lower than told by your health care provider. ? If your knee pain increases, do not bend as low. 3. Hold the squat position for __________ seconds. 4. Slowly push with your legs to return to standing. Do not use your hands to pull yourself to standing. Repeat __________ times. Complete this exercise __________ times a day. Exercise J: Wall Slides (Quadriceps)  1. Lean your back against a smooth wall or door while you walk your feet out 18-24 inches (46-61 cm) from it. 2. Place your feet hip-width apart. 3. Slowly slide down the wall or door until your knees bend __________ degrees. Keep your knees over your heels, not over your toes. Keep your knees in line with your hips. 4. Hold for __________ seconds. Repeat __________ times. Complete this exercise __________ times a day. Exercise K: Straight Leg Raises -   Hip Abductors 1. Lie on your side with your left / right leg in the top position. Lie so your head, shoulder, knee, and hip line up. You may bend your bottom knee to help you keep your balance. 2. Roll your hips slightly forward so your hips are stacked directly over each other and your left / right knee is facing forward. 3. Leading with your heel, lift your top leg 4-6 inches (10-15 cm). You should feel the muscles in your outer hip lifting. ? Do not let your foot drift forward. ? Do not let your knee roll toward the ceiling. 4. Hold this position for __________ seconds. 5. Slowly return your leg to the starting position. 6. Let your muscles relax completely after each repetition. Repeat __________ times. Complete this exercise __________ times a day. Exercise L: Straight Leg Raises - Hip Extensors 1. Lie on your abdomen on a firm surface. You can put a pillow under your hips if that is more comfortable. 2. Tense the muscles in your buttocks and lift your left / right leg about 4-6 inches (10-15 cm). Keep your knee  straight as you lift your leg. 3. Hold this position for __________ seconds. 4. Slowly lower your leg to the starting position. 5. Let your leg relax completely after each repetition. Repeat __________ times. Complete this exercise __________ times a day. This information is not intended to replace advice given to you by your health care provider. Make sure you discuss any questions you have with your health care provider. Document Released: 08/23/2005 Document Revised: 07/03/2016 Document Reviewed: 08/15/2015 Else[employer] Interactive Patient Education  2018 [employer] Hand Exercises Hand exercises can be helpful to almost anyone. These exercises can strengthen the hands, improve flexibility and movement, and increase blood flow to the hands. These results can make work and daily tasks easier. Hand exercises can be especially helpful for people who have joint pain from arthritis or have nerve damage from overuse (carpal tunnel syndrome). These exercises can also help people who have injured a hand. Most of these hand exercises are fairly gentle stretching routines. You can do them often throughout the day. Still, it is a good idea to ask your health care provider which exercises would be best for you. Warming your hands before exercise may help to reduce stiffness. You can do this with gentle massage or by placing your hands in warm water for 15 minutes. Also, make sure you pay attention to your level of hand pain as you begin an exercise routine. Exercises Knuckle Bend Repeat this exercise 5-10 times with each hand. 1. Stand or sit with your arm, hand, and all five fingers pointed straight up. Make sure your wrist is straight. 2. Gently and slowly bend your fingers down and inward until the tips of your fingers are touching the tops of your palm. 3. Hold this position for a few seconds. 4. Extend your fingers out to their original position, all pointing straight up again.  Finger Fan Repeat this  exercise 5-10 times with each hand. 1. Hold your arm and hand out in front of you. Keep your wrist straight. 2. Squeeze your hand into a fist. 3. Hold this position for a few seconds. 4. Fan out, or spread apart, your hand and fingers as much as possible, stretching every joint fully.  Tabletop Repeat this exercise 5-10 times with each hand. 1. Stand or sit with your arm, hand, and all five fingers pointed straight up. Make sure your wrist is straight.   2. Gently and slowly bend your fingers at the knuckles where they meet the hand until your hand is making an upside-down L shape. Your fingers should form a tabletop. 3. Hold this position for a few seconds. 4. Extend your fingers out to their original position, all pointing straight up again.  Making Os Repeat this exercise 5-10 times with each hand. 1. Stand or sit with your arm, hand, and all five fingers pointed straight up. Make sure your wrist is straight. 2. Make an O shape by touching your pointer finger to your thumb. Hold for a few seconds. Then open your hand wide. 3. Repeat this motion with each finger on your hand.  Table Spread Repeat this exercise 5-10 times with each hand. 1. Place your hand on a table with your palm facing down. Make sure your wrist is straight. 2. Spread your fingers out as much as possible. Hold this position for a few seconds. 3. Slide your fingers back together again. Hold for a few seconds.  Ball Grip  Repeat this exercise 10-15 times with each hand. 1. Hold a tennis ball or another soft ball in your hand. 2. While slowly increasing pressure, squeeze the ball as hard as possible. 3. Squeeze as hard as you can for 3-5 seconds. 4. Relax and repeat.  Wrist Curls Repeat this exercise 10-15 times with each hand. 1. Sit in a chair that has armrests. 2. Hold a light weight in your hand, such as a dumbbell that weighs 1-3 pounds (0.5-1.4 kg). Ask your health care provider what weight would be best for  you. 3. Rest your hand just over the end of the chair arm with your palm facing up. 4. Gently pivot your wrist up and down while holding the weight. Do not twist your wrist from side to side.  Contact a health care provider if:  Your hand pain or discomfort gets much worse when you do an exercise.  Your hand pain or discomfort does not improve within 2 hours after you exercise. If you have any of these problems, stop doing these exercises right away. Do not do them again unless your health care provider says that you can. Get help right away if:  You develop sudden, severe hand pain. If this happens, stop doing these exercises right away. Do not do them again unless your health care provider says that you can. This information is not intended to replace advice given to you by your health care provider. Make sure you discuss any questions you have with your health care provider. Document Released: 09/20/2015 Document Revised: 03/16/2016 Document Reviewed: 04/19/2015 [employer] Interactive Patient Education  2018 [employer]  

## 2018-02-03 DIAGNOSIS — J479 Bronchiectasis, uncomplicated: Secondary | ICD-10-CM | POA: Diagnosis not present

## 2018-02-03 DIAGNOSIS — R269 Unspecified abnormalities of gait and mobility: Secondary | ICD-10-CM | POA: Diagnosis not present

## 2018-02-07 DIAGNOSIS — R269 Unspecified abnormalities of gait and mobility: Secondary | ICD-10-CM | POA: Diagnosis not present

## 2018-02-07 DIAGNOSIS — J479 Bronchiectasis, uncomplicated: Secondary | ICD-10-CM | POA: Diagnosis not present
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
## 2018-02-22 ENCOUNTER — Encounter: Payer: Self-pay | Admitting: Internal Medicine

## 2018-02-22 ENCOUNTER — Ambulatory Visit (INDEPENDENT_AMBULATORY_CARE_PROVIDER_SITE_OTHER): Payer: Medicare HMO | Admitting: Internal Medicine

## 2018-02-22 VITALS — BP 128/76 | HR 100 | Temp 97.9°F | Wt 144.8 lb

## 2018-02-22 DIAGNOSIS — Z79899 Other long term (current) drug therapy: Secondary | ICD-10-CM | POA: Diagnosis not present

## 2018-02-22 DIAGNOSIS — E876 Hypokalemia: Secondary | ICD-10-CM

## 2018-02-22 DIAGNOSIS — R768 Other specified abnormal immunological findings in serum: Secondary | ICD-10-CM

## 2018-02-22 DIAGNOSIS — N289 Disorder of kidney and ureter, unspecified: Secondary | ICD-10-CM

## 2018-02-22 DIAGNOSIS — R634 Abnormal weight loss: Secondary | ICD-10-CM

## 2018-02-22 DIAGNOSIS — J479 Bronchiectasis, uncomplicated: Secondary | ICD-10-CM

## 2018-02-22 DIAGNOSIS — R1110 Vomiting, unspecified: Secondary | ICD-10-CM

## 2018-02-22 DIAGNOSIS — E785 Hyperlipidemia, unspecified: Secondary | ICD-10-CM

## 2018-02-22 DIAGNOSIS — E1122 Type 2 diabetes mellitus with diabetic chronic kidney disease: Secondary | ICD-10-CM

## 2018-02-22 LAB — COMPREHENSIVE METABOLIC PANEL
ALT: 18 U/L (ref 0–35)
AST: 25 U/L (ref 0–37)
Albumin: 4.1 g/dL (ref 3.5–5.2)
Alkaline Phosphatase: 82 U/L (ref 39–117)
BUN: 15 mg/dL (ref 6–23)
CO2: 29 mEq/L (ref 19–32)
Calcium: 9.8 mg/dL (ref 8.4–10.5)
Chloride: 101 mEq/L (ref 96–112)
Creatinine, Ser: 1.24 mg/dL — ABNORMAL HIGH (ref 0.40–1.20)
GFR: 55.68 mL/min — ABNORMAL LOW (ref >60.00)
Glucose, Bld: 87 mg/dL (ref 70–99)
Potassium: 3.7 mEq/L (ref 3.5–5.1)
Sodium: 138 mEq/L (ref 135–145)
Total Bilirubin: 0.5 mg/dL (ref 0.2–1.2)
Total Protein: 7.4 g/dL (ref 6.0–8.3)

## 2018-02-22 LAB — MAGNESIUM: Magnesium: 1.8 mg/dL (ref 1.5–2.5)

## 2018-02-22 NOTE — Patient Instructions (Addendum)
Checking     Potassium level  Was low at last check  In addition to magnesium.  We could  Have nsee you to  Check your diet about healthy .     Ask the lung  People   About final  Results .     On infection.

## 2018-02-26 ENCOUNTER — Ambulatory Visit: Payer: Medicare HMO | Admitting: Internal Medicine

## 2018-02-26 DIAGNOSIS — J479 Bronchiectasis, uncomplicated: Secondary | ICD-10-CM | POA: Diagnosis not present

## 2018-03-05 ENCOUNTER — Ambulatory Visit: Payer: Self-pay | Admitting: *Deleted

## 2018-03-05 ENCOUNTER — Telehealth: Payer: Self-pay | Admitting: Internal Medicine

## 2018-03-05 DIAGNOSIS — J479 Bronchiectasis, uncomplicated: Secondary | ICD-10-CM | POA: Diagnosis not present

## 2018-03-05 DIAGNOSIS — R269 Unspecified abnormalities of gait and mobility: Secondary | ICD-10-CM | POA: Diagnosis not present

## 2018-03-05 NOTE — Telephone Encounter (Signed)
I left a voice message for pt to call billing department at [phone], they can assist pt with request.

## 2018-03-05 NOTE — Telephone Encounter (Signed)
Pt has a hx of sinus infections. Every time she cleans her nose she gets about the size of her fingernail in blood, no clots. Has a headache every now and then. No fever. No runny nose. No cold symptoms. She has a hx of allergies. Pt advised of using a humidifier and saline sprays to keep some moisture in her nostrils. Home care advise given to her. If she still has bleeding after doing home care, she was asked to call back. Pt voiced understanding.  Reason for Disposition . Health Information question, no triage required and triager able to answer question  Answer Assessment - Initial Assessment Questions 1. REASON FOR CALL or QUESTION: "What is your reason for calling today?" or "How can I best help you?" or "What question do you have that I can help answer?"     She is having some blood on her tissue when she cleans her nose.  Protocols used: INFORMATION ONLY CALL-A-AH

## 2018-03-05 NOTE — Telephone Encounter (Signed)
Copied from CRM [id number]. Topic: Bill or Statement - Patient/Guarantor Inquiry >> Mar 05, 2018  8:02 AM Louie Bun, [name] wrote: Patient called and said that she would like a copy of her co-pay that she has done from 04/22/17-10/22/17 and for it to be ready for her to pick up tomorrow.

## 2018-03-08 LAB — RESPIRATORY CULTURE OR RESPIRATORY AND SPUTUM CULTURE
MICRO NUMBER:: 90400211
RESULT:: NORMAL
SPECIMEN QUALITY:: ADEQUATE

## 2018-03-08 LAB — MYCOBACTERIA,CULT W/FLUOROCHROME SMEAR
MICRO NUMBER:: 90400210
SMEAR:: NONE SEEN
SPECIMEN QUALITY:: ADEQUATE

## 2018-03-08 LAB — FUNGUS CULTURE W SMEAR
MICRO NUMBER:: 90400209
SMEAR:: NONE SEEN
SPECIMEN QUALITY:: ADEQUATE

## 2018-03-21 ENCOUNTER — Telehealth: Payer: Self-pay | Admitting: Family Medicine

## 2018-03-21 DIAGNOSIS — J449 Chronic obstructive pulmonary disease, unspecified: Secondary | ICD-10-CM

## 2018-03-21 DIAGNOSIS — M549 Dorsalgia, unspecified: Secondary | ICD-10-CM

## 2018-03-21 DIAGNOSIS — F3110 Bipolar disorder, current episode manic without psychotic features, unspecified: Secondary | ICD-10-CM

## 2018-03-21 DIAGNOSIS — F3132 Bipolar disorder, current episode depressed, moderate: Secondary | ICD-10-CM

## 2018-03-21 DIAGNOSIS — M542 Cervicalgia: Secondary | ICD-10-CM

## 2018-03-21 NOTE — Telephone Encounter (Signed)
Copied from CRM [id number]. Topic: Referral - Status >> Mar 21, 2018  3:24 PM Elliot Gault wrote: Relation to pt: self  Call back number: [phone]  Reason for call:   Patient request the following referrals, please advise  Dr  Lupita Leash, MD - COPD - June 4th  Dr. Ophelia Charter orthopedic - back & neck pain - June 5th  Dr. Rayne Du psychiatrist [facility] Dr. Marinda Elk [facility]  >> Mar 21, 2018  3:34 PM Elliot Gault wrote: Relation to pt: self  Call back number: [phone]  Reason for call:   Patient request the following referrals, please advise  Dr  Lupita Leash, MD - COPD - June 4th  Dr. Ophelia Charter orthopedic - back & neck pain - June 5th  Dr. Rayne Du psychiatrist [facility] Dr. Marinda Elk [facility]

## 2018-03-21 NOTE — Telephone Encounter (Signed)
Pt has upcoming appts and needs referrals put in the system for insurance.   Please advise Dr Fabian Sharp, thanks.

## 2018-03-22 NOTE — Telephone Encounter (Signed)
Of course to  [facility]   Updated referrals    For continuity of  care

## 2018-03-25 NOTE — Addendum Note (Signed)
Addended by: Maisie Fus on: 03/25/2018 09:07 AM   Modules accepted: Orders

## 2018-03-25 NOTE — Telephone Encounter (Signed)
Referrals placed as requested for Continuity of Care.  Pt aware. Nothing further needed.

## 2018-03-26 ENCOUNTER — Telehealth: Payer: Self-pay | Admitting: Pulmonary Disease

## 2018-03-26 ENCOUNTER — Ambulatory Visit: Payer: Medicare HMO | Admitting: Pulmonary Disease

## 2018-03-26 ENCOUNTER — Encounter: Payer: Self-pay | Admitting: Pulmonary Disease

## 2018-03-26 VITALS — BP 130/74 | HR 93 | Ht 62.0 in | Wt 149.0 lb

## 2018-03-26 DIAGNOSIS — J432 Centrilobular emphysema: Secondary | ICD-10-CM | POA: Diagnosis not present

## 2018-03-26 DIAGNOSIS — J301 Allergic rhinitis due to pollen: Secondary | ICD-10-CM

## 2018-03-26 DIAGNOSIS — J479 Bronchiectasis, uncomplicated: Secondary | ICD-10-CM | POA: Diagnosis not present

## 2018-03-26 NOTE — Telephone Encounter (Signed)
Called patient, unable to reach left message to give us a call back. 

## 2018-03-26 NOTE — Progress Notes (Signed)
Subjective:    [name], female    DOB: 05/24/1951, 67 y.o.   MRN: [id number]  Synopsis: Referred in 2018 for evaluation of cough, found to have bronchiectasis on a CT scan of the chest in December 2018.  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  A modified barium swallow test performed in early 2019 showed some penetration of thin liquids to the piriform sinus but no laryngeal penetration or aspiration. Smoked 2 PP for 28 years, quit 2003 Has GERD and hypertrophic cardiomyopathy.   Chief Complaint  Patient presents with  . Follow-up    6-8 wk ROV, nose bleeds in mornings, sinus pain    Tashe says that she loves the therapy vest, it helps her cough.  She says that her sinus congestion has worsened a little.  The mucus from her chest has been thicker than normal, but no color change.  Her breathing is better.    No recent bronchitis since starting the therapy vest.  She hasn't been getting around as well lately due to the pains in her legs.  She is going to see orthopedic surgery tomorrow.  Some mild nose bleeding lately.     Past Medical History:  Diagnosis Date  . Anxiety   . Bipolar disorder ([place])   . CANDIDIASIS, ESOPHAGEAL 07/28/2009   Qualifier: Diagnosis of  By: [name] MD, [name]   . Chronic kidney disease    CKD III  . COPD (chronic obstructive pulmonary disease) ([place])   . Depression   . Diabetes mellitus without complication (HCC)    no meds  . FH: colonic polyps   . Fractured elbow    right   . GERD (gastroesophageal reflux disease)   . HH (hiatus hernia)   . History of carpal tunnel syndrome   . History of chest pain   . History of transfusion of packed red blood cells   . Hyperlipidemia   . Hypertension   . Neuroleptic-induced tardive dyskinesia   . Osteoarthritis of more than one site   . Seasonal allergies       Review of Systems  Constitutional: Negative for fever and unexpected weight change.    HENT: Positive for congestion. Negative for dental problem, ear pain, nosebleeds, postnasal drip, rhinorrhea, sinus pressure, sneezing, sore throat and trouble swallowing.   Respiratory: Positive for cough, chest tightness and shortness of breath. Negative for wheezing.   Cardiovascular: Negative for palpitations and leg swelling.  Gastrointestinal: Negative for vomiting.  Skin: Negative for rash.       Objective:   Physical Exam  Vitals:   03/26/18 1346  BP: 130/74  Pulse: 93  SpO2: 99%  Weight: 149 lb (67.6 kg)  Height: 5' 2"  (1.575 m)    Gen: well appearing HENT: OP clear, TM's clear, neck supple PULM: CTA B, normal percussion CV: RRR, slight systolic murmur, trace edema GI: BS+, soft, nontender Derm: no cyanosis or rash Psyche: normal mood and affect     CBC    Component Value Date/Time   WBC 6.9 09/25/2017 1400   RBC 4.20 09/25/2017 1400   HGB 13.3 12/13/2017   HCT 39 12/13/2017   PLT 329 09/25/2017 1400   MCV 87.9 09/25/2017 1400   MCH 29.8 09/25/2017 1400   MCHC 33.9 09/25/2017 1400   RDW 13.2 09/25/2017 1400   LYMPHSABS 1.9 08/07/2017 1527   MONOABS 0.6 08/07/2017 1527   EOSABS 0.2 08/07/2017 1527   BASOSABS 0.1 08/07/2017  9935    Labs: December 2018 rheumatoid factor elevated at 127, CCP negative December 2018 serum IgE slightly elevated at 120 kU/L  Chest imaging: August 22, 2017 images independently reviewed showing normal pulmonary lung fields.  January 2019 high-resolution CT chest images independently reviewed showing no emphysema, there is some mild bronchiectasis and mucous plugging in the bases of both lungs bilaterally  Other imaging: February 2019 barium swallow showed premature spill into the piriform sinuses with thin liquid but no evidence of aspiration  Echocardiogram: June 27, 2017 transthoracic US echocardiogram LVEF 65-70%, severe asymmetric septal hypertrophy grade 1 diastolic dysfunction, right ventricle cavity size  normal, systolic function normal  PFT: November 2018 spirometry normal January 2019 ratio normal, FEV1 1.36 L 74% predicted, FVC 1.79 L 76% predicted, total lung capacity 6.19 L 128% predicted, residual volume 79% predicted, DLCO 13.3 (59% predicted) flow volume loop is consistent with small airways obstruction       Assessment & Plan:    Bronchiectasis without complication (HCC)  Allergic rhinitis due to pollen, unspecified seasonality  Centrilobular emphysema ([place])  Discussion: This is been a stable interval for Ms. [name].  She has not had an exacerbation since the last visit.  She really likes the addition of a therapy vest as this has helped her produce more mucus.  She has not had an exacerbation since adding it in her breathing has improved.  Plan: For your bronchiectasis: Use the therapy vest twice a day You do not need to use the flutter valve Use the DuoNeb and hypertonic saline once a day or more frequently if you have increased shortness of breath or chest congestion  COPD: Continue Anoro daily  Allergic rhinitis: Because you have had a little bit of nosebleeding over the last few days I would like for you to stop using the saline rinses and Flonase for 2 days.  After 2 days start using the Flonase at one half the dose you were using prior and only use the saline rinse as needed Continue cetirizine Continue Singulair  Follow-up in 6 months or sooner if needed   Current Outpatient Medications:  .  acetaminophen (TYLENOL) 500 MG tablet, Take 2 tablets (1,000 mg total) by mouth every 6 (six) hours as needed. (Patient taking differently: Take 1,000 mg by mouth every 6 (six) hours as needed (for pain/headache.). ), Disp: 30 tablet, Rfl: 0 .  atorvastatin (LIPITOR) 20 MG tablet, TAKE 1 TABLET BY MOUTH ONCE DAILY, Disp: 90 tablet, Rfl: 1 .  Blood Glucose Monitoring Suppl (ACCU-CHEK NANO SMARTVIEW) W/DEVICE KIT, Use to check blood sugar 1-2 times daily (Patient taking  differently: 1 strip by Other route 2 (two) times a week. ), Disp: 1 kit, Rfl: 0 .  cetirizine (ZYRTEC) 10 MG tablet, Take 10 mg by mouth at bedtime. , Disp: , Rfl:  .  cholecalciferol (VITAMIN D) 1000 units tablet, Take 1,000 Units by mouth daily., Disp: , Rfl:  .  Cyanocobalamin (VITAMIN B-12 PO), Take by mouth daily., Disp: , Rfl:  .  diclofenac sodium (VOLTAREN) 1 % GEL, Apply 3 grams to 3 large joints, up to 3 times daily., Disp: 3 Tube, Rfl: 3 .  docusate sodium (COLACE) 100 MG capsule, Take 100 mg by mouth at bedtime. , Disp: , Rfl:  .  DULoxetine (CYMBALTA) 60 MG capsule, Take 1 capsule (60 mg total) by mouth daily. (Patient taking differently: Take 60 mg by mouth 2 (two) times daily. ), Disp: 30 capsule, Rfl: 0 .  fluticasone (FLONASE)  50 MCG/ACT nasal spray, Place 1-2 sprays into both nostrils at bedtime. , Disp: , Rfl:  .  glucose blood test strip, Accu-chek nano, test glucose once daily, dx E11.9, Disp: 100 each, Rfl: 12 .  ipratropium-albuterol (DUONEB) 0.5-2.5 (3) MG/3ML SOLN, Take 3 mLs by nebulization 3 (three) times daily., Disp: 360 mL, Rfl: 5 .  lamoTRIgine (LAMICTAL) 25 MG tablet, Take 1 tablet (25 mg total) by mouth 2 (two) times daily., Disp: 14 tablet, Rfl: 0 .  Lancets Misc. (ACCU-CHEK FASTCLIX LANCET) KIT, USE TO CHECK BLOOD SUGAR 1-2 TIMES DAILY, Disp: 1 kit, Rfl: 0 .  Linaclotide (LINZESS PO), Take by mouth daily., Disp: , Rfl:  .  losartan (COZAAR) 25 MG tablet, take 1 tablet by mouth once daily, Disp: 90 tablet, Rfl: 1 .  montelukast (SINGULAIR) 10 MG tablet, Take 1 tablet (10 mg total) at bedtime by mouth., Disp: 30 tablet, Rfl: 11 .  Multiple Vitamins-Minerals (CENTRUM SILVER PO), Take 1 tablet by mouth daily. , Disp: , Rfl:  .  ondansetron (ZOFRAN-ODT) 4 MG disintegrating tablet, Take 1 tablet (4 mg total) by mouth every 8 (eight) hours as needed for nausea or vomiting., Disp: 20 tablet, Rfl: 0 .  OVER THE COUNTER MEDICATION, Take 1 capsule by mouth every morning.  IBgard & Fgard, Disp: , Rfl:  .  pantoprazole (PROTONIX) 40 MG tablet, Take 40 mg by mouth daily., Disp: , Rfl:  .  Probiotic Product (ALIGN PO), Take 1 capsule by mouth at bedtime. Align, Disp: , Rfl:  .  Respiratory Therapy Supplies (FLUTTER) DEVI, 1 Device by Does not apply route as directed., Disp: 1 each, Rfl: 0 .  sodium chloride HYPERTONIC 3 % nebulizer solution, Take by nebulization 2 (two) times daily., Disp: 750 mL, Rfl: 5 .  umeclidinium-vilanterol (ANORO ELLIPTA) 62.5-25 MCG/INH AEPB, Inhale 1 puff into the lungs daily., Disp: 60 each, Rfl: 5 .  vitamin E 100 UNIT capsule, Take 100 Units by mouth daily., Disp: , Rfl:

## 2018-03-26 NOTE — Patient Instructions (Addendum)
For your bronchiectasis: Use the therapy vest twice a day You do not need to use the flutter valve Use the DuoNeb and hypertonic saline once a day or more frequently if you have increased shortness of breath or chest congestion  COPD: Continue Anoro daily  Allergic rhinitis: Because you have had a little bit of nosebleeding over the last few days I would like for you to stop using the saline rinses and Flonase for 2 days.  After 2 days start using the Flonase at one half the dose you were using prior and only use the saline rinse as needed Continue cetirizine Continue Singulair  Follow-up in 6 months or sooner if needed

## 2018-03-27 ENCOUNTER — Ambulatory Visit (INDEPENDENT_AMBULATORY_CARE_PROVIDER_SITE_OTHER): Payer: Medicare HMO

## 2018-03-27 ENCOUNTER — Encounter (INDEPENDENT_AMBULATORY_CARE_PROVIDER_SITE_OTHER): Payer: Self-pay | Admitting: Orthopaedic Surgery

## 2018-03-27 ENCOUNTER — Ambulatory Visit (INDEPENDENT_AMBULATORY_CARE_PROVIDER_SITE_OTHER): Payer: Medicare HMO | Admitting: Orthopaedic Surgery

## 2018-03-27 VITALS — BP 141/81 | HR 91 | Ht 62.0 in | Wt 149.0 lb

## 2018-03-27 DIAGNOSIS — G8929 Other chronic pain: Secondary | ICD-10-CM

## 2018-03-27 DIAGNOSIS — M542 Cervicalgia: Secondary | ICD-10-CM

## 2018-03-27 DIAGNOSIS — M545 Low back pain: Secondary | ICD-10-CM

## 2018-03-27 NOTE — Telephone Encounter (Signed)
Spoke with pt, wants to know if her dx of bronchiectasis is something that she should report to social security.  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  I advised pt that she should report this to her SS rep and if anything further is needed they can address this with her.  Pt expressed understanding.  Nothing further needed.

## 2018-03-29 DIAGNOSIS — J479 Bronchiectasis, uncomplicated: Secondary | ICD-10-CM | POA: Diagnosis not present

## 2018-04-01 ENCOUNTER — Encounter (INDEPENDENT_AMBULATORY_CARE_PROVIDER_SITE_OTHER): Payer: Self-pay | Admitting: Orthopaedic Surgery

## 2018-04-01 NOTE — Progress Notes (Signed)
Office Visit Note   Patient: Desiree [name]           Date of Birth: [birth year]           MRN: [id number] Visit Date: 03/27/2018              Requested by: Madelin Headings, MD [address] [place], [place] [address] PCP: Madelin Headings, MD   Assessment & Plan: Visit Diagnoses:  1. Neck pain   2. Chronic bilateral low back pain, with sciatica presence unspecified     Plan: Solid cervical lumbar fusions.  She had some foraminal narrowing on the right at C3-4 and on the left at C4-5 on 2017 study.  Her most significant level C5-6 is fused solidly.  Solid L4-5 fusion interbody as well as lateral gutter.  Anterolisthesis degenerative in nature at L5-S1 with previous facet cyst likely cause of her problems with bilateral lateral recess and foraminal narrowing.  This is stable and not severe enough to consider operative intervention at this time.  She does not have neurogenic claudication symptoms.  Follow-Up Instructions: Return if symptoms worsen or fail to improve.   Orders:  Orders Placed This Encounter  Procedures  . XR Cervical Spine 2 or 3 views  . XR Lumbar Spine 2-3 Views   No orders of the defined types were placed in this encounter.     Procedures: No procedures performed   Clinical Data: No additional findings.   Subjective: Chief Complaint  Patient presents with  . Lower Back - Pain  . Neck - Pain    HPI 67 year old female returns with ongoing discomfort in her posterior neck worse on the left side than right side.  She also continues to have some back pain pain that radiates down the legs right equal to left.  She is tried some Voltaren gel with relief also occasionally takes Tylenol.  Previous C5-6 cervical fusion December 2017.  L4-5 instrumented fusion in 2011.  MRI scan 2017 showed facet cyst with anterolisthesis at L5-S1 and bilateral narrowing of the lateral recess and foramina.  She denies chills or fever no direct neurogenic claudication symptoms.   Bronchiectasis on CT scan of her chest from December 2018.  Positive rheumatoid factor.  Review of Systems positive for GERD hypertrophic cardiomyopathy.  Previous cervical and lumbar fusions.  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  Carpal tunnel releases impingement the shoulder.  Hyperlipidemia, hypertension otherwise 14 point review of systems negative as pertains HPI.   Objective: Vital Signs: BP (!) 141/81   Pulse 91   Ht 5\' 2"  (1.575 m)   Wt 149 lb (67.6 kg)   BMI 27.25 kg/m   Physical Exam  Constitutional: She is oriented to person, place, and time. She appears well-developed and well-nourished.  HENT:  Head: Normocephalic.  Right Ear: External ear normal.  Left Ear: External ear normal.  Eyes: Pupils are equal, round, and reactive to light.  Neck: No tracheal deviation present. No thyromegaly present.  Cardiovascular: Normal rate.  Pulmonary/Chest: Effort normal.  Abdominal: Soft.  Neurological: She is alert and oriented to person, place, and time.  Skin: Skin is warm and dry. Capillary refill takes less than 2 seconds.  Psychiatric: She has a normal mood and affect. Her behavior is normal.  Flat affect, alert , conversant, pleasant.     Ortho Exam upper extremity reflexes are symmetrical she has some tenderness on the trapezius muscle on the left side.  Negative Spurling.  No biceps triceps weakness negative impingement the shoulder she can get both arms overhead.  Well-healed carpal tunnel releases.  Mild sciatic notch tenderness right and left.  Negative straight leg raising 90 degrees she is able to heel and toe walk.  Well-healed lumbar incision.  Anterior tib EHL gastrocsoleus is strong distal pulses are intact.  Specialty Comments:  No specialty comments available.  Imaging: No results found.   PMFS History: Patient Active Problem List   Diagnosis Date Noted  . Primary osteoarthritis of both feet 12/21/2017  . DDD  (degenerative disc disease), lumbar 12/21/2017  . Primary osteoarthritis of both knees 12/21/2017  . History of bilateral carpal tunnel release 12/21/2017  . DDD (degenerative disc disease), cervical 11/15/2017  . Former smoker 11/15/2017  . Bronchiectasis without complication (HCC) 10/25/2017  . Impingement syndrome of right shoulder 07/25/2017  . Hx of fusion of cervical spine 07/25/2017  . Cervical spinal stenosis 09/27/2016  . Closed displaced fracture of proximal phalanx of lesser toe of right foot 09/08/2016  . Spinal stenosis of cervical region 09/08/2016  . Polypharmacy 06/23/2015  . Hyperlipidemia 04/19/2015  . Visit for preventive health examination 01/01/2015  . Primary osteoarthritis involving multiple joints 01/01/2015  . Bipolar affective disorder, currently depressed, moderate (HCC)   . Bipolar I disorder with mania (HCC) 08/17/2014  . Foot cramps 07/03/2014  . Hypertension 10/21/2013  . Ear fullness 10/21/2013  . Dizziness 03/25/2013  . Medication withdrawal (HCC) 03/05/2013  . Diabetes mellitus with renal manifestations, controlled (HCC) 11/10/2012  . Alopecia areata 02/06/2012  . Obesity (BMI 30-39.9) 02/06/2012  . Renal insufficiency 07/16/2011  . Neuroleptic-induced tardive dyskinesia   . Exertional dyspnea 02/07/2011  . Dyspnea on exertion 01/19/2011  . DIABETES MELLITUS, TYPE II, CONTROLLED 04/22/2010  . Carpal tunnel syndrome 02/02/2010  . CONSTIPATION 02/02/2010  . Primary osteoarthritis of both hands 02/02/2010  . CATARACTS 04/13/2009  . PAIN IN JOINT, ANKLE AND FOOT 09/16/2008  . EOSINOPHILIA 07/21/2008  . AFFECTIVE DISORDER 04/21/2008  . BACK PAIN, CHRONIC 02/12/2008  . LEG PAIN 02/12/2008  . ABNORMAL INVOLUNTARY MOVEMENTS 12/04/2007  . POSTURAL LIGHTHEADEDNESS 11/04/2007  . COLONIC POLYPS, HX OF 11/04/2007  . Essential hypertension 06/17/2007  . HYPERLIPIDEMIA 04/08/2007  . MITRAL VALVE PROLAPSE 04/08/2007  . GERD 04/08/2007  . LOW BACK PAIN  SYNDROME 04/08/2007  . CHEST PAIN, RECURRENT 04/08/2007   Past Medical History:  Diagnosis Date  . Anxiety   . Bipolar disorder (HCC)   . CANDIDIASIS, ESOPHAGEAL 07/28/2009   Qualifier: Diagnosis of  By: Fabian Sharp MD, Neta Mends   . Chronic kidney disease    CKD III  . COPD (chronic obstructive pulmonary disease) (HCC)   . Depression   . Diabetes mellitus without complication (HCC)    no meds  . FH: colonic polyps   . Fractured elbow    right   . GERD (gastroesophageal reflux disease)   . HH (hiatus hernia)   . History of carpal tunnel syndrome   . History of chest pain   . History of transfusion of packed red blood cells   . Hyperlipidemia   . Hypertension   . Neuroleptic-induced tardive dyskinesia   . Osteoarthritis of more than one site   . Seasonal allergies     Family History  Problem Relation Age of Onset  . Heart attack Father   . Heart disease Father   . Throat cancer Brother   . Diabetes Mother   . Hypertension Mother   . Heart attack Mother   .  Diabetes type II Brother   . Heart disease Brother   . Lung cancer Brother   . Breast cancer Cousin   . Lung cancer Daughter   . Lung cancer Paternal Uncle     Past Surgical History:  Procedure Laterality Date  . ANTERIOR CERVICAL DECOMP/DISCECTOMY FUSION  09/27/2016   C5-6 anterior cervical discectomy and fusion, allograft and plate/notes 16/10/958  . ANTERIOR CERVICAL DECOMP/DISCECTOMY FUSION N/A 09/27/2016   Procedure: C5-6 Anterior Cervical Discectomy and Fusion, Allograft and Plate;  Surgeon: Eldred Manges, MD;  Location: [facility] OR;  Service: Orthopedics;  Laterality: N/A;  . Back Fusion  2002  . CARPAL TUNNEL RELEASE  yates   left  . COLONOSCOPY N/A 01/05/2014   Procedure: COLONOSCOPY;  Surgeon: Charna Elizabeth, MD;  Location: [facility];  Service: Endoscopy;  Laterality: N/A;  . ELBOW SURGERY     age 39  . EXTERNAL EAR SURGERY Left   . EYE SURGERY     "removed white dots under eyelid"  . FINGER SURGERY Left   .  Juvara osteomy    . KNEE SURGERY    . NOSE SURGERY    . Rt. toe bunion    . skin, shave biopsy  05/03/2016   Left occipital scalp, top of scalp   Social History   Occupational History  . Not on file  Tobacco Use  . Smoking status: Former Smoker    Packs/day: 2.00    Years: 30.00    Pack years: 60.00    Types: Cigarettes    Last attempt to quit: 10/23/2001    Years since quitting: 16.4  . Smokeless tobacco: Never Used  Substance and Sexual Activity  . Alcohol use: No  . Drug use: No  . Sexual activity: Not on file

## 2018-04-05 DIAGNOSIS — J479 Bronchiectasis, uncomplicated: Secondary | ICD-10-CM | POA: Diagnosis not present

## 2018-04-05 DIAGNOSIS — R269 Unspecified abnormalities of gait and mobility: Secondary | ICD-10-CM | POA: Diagnosis not present

## 2018-04-28 DIAGNOSIS — J479 Bronchiectasis, uncomplicated: Secondary | ICD-10-CM | POA: Diagnosis not present

## 2018-05-03 ENCOUNTER — Other Ambulatory Visit: Payer: Self-pay | Admitting: Pulmonary Disease

## 2018-05-05 DIAGNOSIS — R269 Unspecified abnormalities of gait and mobility: Secondary | ICD-10-CM | POA: Diagnosis not present

## 2018-05-05 DIAGNOSIS — J479 Bronchiectasis, uncomplicated: Secondary | ICD-10-CM | POA: Diagnosis not present

## 2018-05-06 DIAGNOSIS — R269 Unspecified abnormalities of gait and mobility: Secondary | ICD-10-CM | POA: Diagnosis not present

## 2018-05-06 DIAGNOSIS — J479 Bronchiectasis, uncomplicated: Secondary | ICD-10-CM | POA: Diagnosis not present

## 2018-05-08 DIAGNOSIS — F314 Bipolar disorder, current episode depressed, severe, without psychotic features: Secondary | ICD-10-CM | POA: Diagnosis not present

## 2018-05-20 ENCOUNTER — Other Ambulatory Visit: Payer: Self-pay | Admitting: Internal Medicine
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
## 2018-05-27 ENCOUNTER — Ambulatory Visit: Payer: Medicare HMO | Admitting: Internal Medicine

## 2018-05-27 DIAGNOSIS — Z0289 Encounter for other administrative examinations: Secondary | ICD-10-CM

## 2018-05-29 DIAGNOSIS — J479 Bronchiectasis, uncomplicated: Secondary | ICD-10-CM | POA: Diagnosis not present

## 2018-06-05 DIAGNOSIS — R269 Unspecified abnormalities of gait and mobility: Secondary | ICD-10-CM | POA: Diagnosis not present

## 2018-06-05 DIAGNOSIS — J479 Bronchiectasis, uncomplicated: Secondary | ICD-10-CM | POA: Diagnosis not present

## 2018-06-14 ENCOUNTER — Ambulatory Visit: Payer: Self-pay | Admitting: *Deleted
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
## 2018-06-18 ENCOUNTER — Ambulatory Visit: Payer: Medicare HMO | Admitting: Internal Medicine

## 2018-06-18 DIAGNOSIS — Z0289 Encounter for other administrative examinations: Secondary | ICD-10-CM

## 2018-06-26 ENCOUNTER — Other Ambulatory Visit: Payer: Self-pay | Admitting: Internal Medicine

## 2018-06-29 DIAGNOSIS — J479 Bronchiectasis, uncomplicated: Secondary | ICD-10-CM | POA: Diagnosis not present

## 2018-07-03 ENCOUNTER — Ambulatory Visit: Payer: Medicare HMO | Admitting: Internal Medicine

## 2018-07-03 ENCOUNTER — Other Ambulatory Visit: Payer: Self-pay | Admitting: Internal Medicine

## 2018-07-06 DIAGNOSIS — J479 Bronchiectasis, uncomplicated: Secondary | ICD-10-CM | POA: Diagnosis not present

## 2018-07-06 DIAGNOSIS — R269 Unspecified abnormalities of gait and mobility: Secondary | ICD-10-CM | POA: Diagnosis not present

## 2018-07-08 ENCOUNTER — Telehealth: Payer: Self-pay | Admitting: Pulmonary Disease

## 2018-07-08 NOTE — Telephone Encounter (Signed)
Patient called stating that Anoro is over $67 each time she fills it.  She is requesting a cheaper option if possible.  She uses [facility] on [address].  Will route to Dr. Kendrick Fries

## 2018-07-09 ENCOUNTER — Ambulatory Visit: Payer: Medicare HMO

## 2018-07-10 NOTE — Telephone Encounter (Signed)
She needs to check her medication formulary and let us know what her insurance will cover

## 2018-07-10 NOTE — Telephone Encounter (Signed)
Called and spoke with patient, advised of BQ response, she verbalized understanding and will call us back once she has the information. Nothing further needed at this time.

## 2018-07-11 ENCOUNTER — Emergency Department (HOSPITAL_COMMUNITY): Payer: Medicare HMO

## 2018-07-11 ENCOUNTER — Other Ambulatory Visit: Payer: Self-pay

## 2018-07-11 ENCOUNTER — Inpatient Hospital Stay (HOSPITAL_COMMUNITY)
Admission: EM | Admit: 2018-07-11 | Discharge: 2018-07-20 | DRG: 194 | Disposition: A | Discharge status: Home / Self Care | Payer: Medicare HMO | Attending: Internal Medicine | Admitting: Internal Medicine

## 2018-07-11 ENCOUNTER — Observation Stay (HOSPITAL_COMMUNITY): Payer: Medicare HMO

## 2018-07-11 DIAGNOSIS — K2970 Gastritis, unspecified, without bleeding: Secondary | ICD-10-CM | POA: Diagnosis present

## 2018-07-11 DIAGNOSIS — M5136 Other intervertebral disc degeneration, lumbar region: Secondary | ICD-10-CM | POA: Diagnosis present

## 2018-07-11 DIAGNOSIS — Z885 Allergy status to narcotic agent status: Secondary | ICD-10-CM

## 2018-07-11 DIAGNOSIS — J189 Pneumonia, unspecified organism: Secondary | ICD-10-CM | POA: Diagnosis present

## 2018-07-11 DIAGNOSIS — E785 Hyperlipidemia, unspecified: Secondary | ICD-10-CM | POA: Diagnosis present

## 2018-07-11 DIAGNOSIS — Z981 Arthrodesis status: Secondary | ICD-10-CM | POA: Diagnosis not present

## 2018-07-11 DIAGNOSIS — K219 Gastro-esophageal reflux disease without esophagitis: Secondary | ICD-10-CM | POA: Diagnosis not present

## 2018-07-11 DIAGNOSIS — Z801 Family history of malignant neoplasm of trachea, bronchus and lung: Secondary | ICD-10-CM | POA: Diagnosis not present

## 2018-07-11 DIAGNOSIS — J019 Acute sinusitis, unspecified: Secondary | ICD-10-CM | POA: Diagnosis present

## 2018-07-11 DIAGNOSIS — J479 Bronchiectasis, uncomplicated: Secondary | ICD-10-CM

## 2018-07-11 DIAGNOSIS — Z23 Encounter for immunization: Secondary | ICD-10-CM | POA: Diagnosis present

## 2018-07-11 DIAGNOSIS — F3110 Bipolar disorder, current episode manic without psychotic features, unspecified: Secondary | ICD-10-CM | POA: Diagnosis present

## 2018-07-11 DIAGNOSIS — I129 Hypertensive chronic kidney disease with stage 1 through stage 4 chronic kidney disease, or unspecified chronic kidney disease: Secondary | ICD-10-CM | POA: Diagnosis present

## 2018-07-11 DIAGNOSIS — Z888 Allergy status to other drugs, medicaments and biological substances status: Secondary | ICD-10-CM

## 2018-07-11 DIAGNOSIS — Z803 Family history of malignant neoplasm of breast: Secondary | ICD-10-CM | POA: Diagnosis not present

## 2018-07-11 DIAGNOSIS — T17890A Other foreign object in other parts of respiratory tract causing asphyxiation, initial encounter: Secondary | ICD-10-CM | POA: Diagnosis present

## 2018-07-11 DIAGNOSIS — Z8249 Family history of ischemic heart disease and other diseases of the circulatory system: Secondary | ICD-10-CM | POA: Diagnosis not present

## 2018-07-11 DIAGNOSIS — Z87891 Personal history of nicotine dependence: Secondary | ICD-10-CM

## 2018-07-11 DIAGNOSIS — D509 Iron deficiency anemia, unspecified: Secondary | ICD-10-CM | POA: Diagnosis not present

## 2018-07-11 DIAGNOSIS — M159 Polyosteoarthritis, unspecified: Secondary | ICD-10-CM | POA: Diagnosis present

## 2018-07-11 DIAGNOSIS — E1122 Type 2 diabetes mellitus with diabetic chronic kidney disease: Secondary | ICD-10-CM | POA: Diagnosis present

## 2018-07-11 DIAGNOSIS — Z7951 Long term (current) use of inhaled steroids: Secondary | ICD-10-CM

## 2018-07-11 DIAGNOSIS — N183 Chronic kidney disease, stage 3 (moderate): Secondary | ICD-10-CM | POA: Diagnosis present

## 2018-07-11 DIAGNOSIS — J441 Chronic obstructive pulmonary disease with (acute) exacerbation: Secondary | ICD-10-CM | POA: Diagnosis not present

## 2018-07-11 DIAGNOSIS — R05 Cough: Secondary | ICD-10-CM | POA: Diagnosis not present

## 2018-07-11 DIAGNOSIS — R0602 Shortness of breath: Secondary | ICD-10-CM

## 2018-07-11 DIAGNOSIS — D721 Eosinophilia, unspecified: Secondary | ICD-10-CM

## 2018-07-11 DIAGNOSIS — J439 Emphysema, unspecified: Secondary | ICD-10-CM | POA: Diagnosis present

## 2018-07-11 DIAGNOSIS — I341 Nonrheumatic mitral (valve) prolapse: Secondary | ICD-10-CM | POA: Diagnosis present

## 2018-07-11 DIAGNOSIS — Z880 Allergy status to penicillin: Secondary | ICD-10-CM

## 2018-07-11 DIAGNOSIS — E1129 Type 2 diabetes mellitus with other diabetic kidney complication: Secondary | ICD-10-CM | POA: Diagnosis present

## 2018-07-11 DIAGNOSIS — G2401 Drug induced subacute dyskinesia: Secondary | ICD-10-CM | POA: Diagnosis present

## 2018-07-11 DIAGNOSIS — K2990 Gastroduodenitis, unspecified, without bleeding: Secondary | ICD-10-CM | POA: Diagnosis not present

## 2018-07-11 DIAGNOSIS — F319 Bipolar disorder, unspecified: Secondary | ICD-10-CM | POA: Diagnosis present

## 2018-07-11 DIAGNOSIS — J8410 Pulmonary fibrosis, unspecified: Secondary | ICD-10-CM | POA: Diagnosis present

## 2018-07-11 DIAGNOSIS — K3189 Other diseases of stomach and duodenum: Secondary | ICD-10-CM | POA: Diagnosis not present

## 2018-07-11 DIAGNOSIS — Z8371 Family history of colonic polyps: Secondary | ICD-10-CM | POA: Diagnosis not present

## 2018-07-11 DIAGNOSIS — J471 Bronchiectasis with (acute) exacerbation: Secondary | ICD-10-CM | POA: Diagnosis present

## 2018-07-11 DIAGNOSIS — K228 Other specified diseases of esophagus: Secondary | ICD-10-CM | POA: Diagnosis not present

## 2018-07-11 DIAGNOSIS — K449 Diaphragmatic hernia without obstruction or gangrene: Secondary | ICD-10-CM | POA: Diagnosis present

## 2018-07-11 DIAGNOSIS — J181 Lobar pneumonia, unspecified organism: Secondary | ICD-10-CM | POA: Diagnosis present

## 2018-07-11 DIAGNOSIS — R1013 Epigastric pain: Secondary | ICD-10-CM | POA: Diagnosis not present

## 2018-07-11 DIAGNOSIS — T380X5A Adverse effect of glucocorticoids and synthetic analogues, initial encounter: Secondary | ICD-10-CM | POA: Diagnosis not present

## 2018-07-11 DIAGNOSIS — Z808 Family history of malignant neoplasm of other organs or systems: Secondary | ICD-10-CM

## 2018-07-11 DIAGNOSIS — Z833 Family history of diabetes mellitus: Secondary | ICD-10-CM

## 2018-07-11 DIAGNOSIS — F419 Anxiety disorder, unspecified: Secondary | ICD-10-CM | POA: Diagnosis present

## 2018-07-11 DIAGNOSIS — K2980 Duodenitis without bleeding: Secondary | ICD-10-CM | POA: Diagnosis present

## 2018-07-11 DIAGNOSIS — Z881 Allergy status to other antibiotic agents status: Secondary | ICD-10-CM

## 2018-07-11 DIAGNOSIS — I422 Other hypertrophic cardiomyopathy: Secondary | ICD-10-CM | POA: Diagnosis present

## 2018-07-11 DIAGNOSIS — J9811 Atelectasis: Secondary | ICD-10-CM | POA: Diagnosis not present

## 2018-07-11 DIAGNOSIS — I10 Essential (primary) hypertension: Secondary | ICD-10-CM | POA: Diagnosis present

## 2018-07-11 DIAGNOSIS — R112 Nausea with vomiting, unspecified: Secondary | ICD-10-CM | POA: Diagnosis not present

## 2018-07-11 DIAGNOSIS — R079 Chest pain, unspecified: Secondary | ICD-10-CM | POA: Diagnosis not present

## 2018-07-11 DIAGNOSIS — R000 Tachycardia, unspecified: Secondary | ICD-10-CM | POA: Diagnosis not present

## 2018-07-11 DIAGNOSIS — R1110 Vomiting, unspecified: Secondary | ICD-10-CM | POA: Diagnosis not present

## 2018-07-11 LAB — BASIC METABOLIC PANEL
Anion gap: 14 (ref 5–15)
BUN: 12 mg/dL (ref 8–23)
CO2: 25 mmol/L (ref 22–32)
Calcium: 9.3 mg/dL (ref 8.9–10.3)
Chloride: 101 mmol/L (ref 98–111)
Creatinine, Ser: 1.32 mg/dL — ABNORMAL HIGH (ref 0.44–1.00)
GFR calc Af Amer: 48 mL/min — ABNORMAL LOW (ref >60)
GFR calc non Af Amer: 41 mL/min — ABNORMAL LOW (ref >60)
Glucose, Bld: 77 mg/dL (ref 70–99)
Potassium: 4.6 mmol/L (ref 3.5–5.1)
Sodium: 140 mmol/L (ref 135–145)

## 2018-07-11 LAB — CBC
HCT: 40.1 % (ref 36.0–46.0)
Hemoglobin: 12.7 g/dL (ref 12.0–15.0)
MCH: 30.4 pg (ref 26.0–34.0)
MCHC: 31.7 g/dL (ref 30.0–36.0)
MCV: 95.9 fL (ref 78.0–100.0)
Platelets: 391 10*3/uL (ref 150–400)
RBC: 4.18 MIL/uL (ref 3.87–5.11)
RDW: 13.8 % (ref 11.5–15.5)
WBC: 27.6 10*3/uL — ABNORMAL HIGH (ref 4.0–10.5)

## 2018-07-11 LAB — I-STAT CG4 LACTIC ACID, ED: Lactic Acid, Venous: 1.11 mmol/L (ref 0.5–1.9)

## 2018-07-11 LAB — I-STAT TROPONIN, ED: Troponin i, poc: 0 ng/mL (ref 0.00–0.08)

## 2018-07-11 MED ORDER — LEVOFLOXACIN IN D5W 750 MG/150ML IV SOLN
750.0000 mg | Freq: Once | INTRAVENOUS | Status: AC
  Administered 2018-07-11: 750 mg via INTRAVENOUS
  Filled 2018-07-11: qty 150

## 2018-07-11 MED ORDER — ONDANSETRON 4 MG PO TBDP
4.0000 mg | ORAL_TABLET | Freq: Once | ORAL | Status: AC
  Administered 2018-07-11: 4 mg via ORAL
  Filled 2018-07-11: qty 1

## 2018-07-11 MED ORDER — SODIUM CHLORIDE 0.9 % IV BOLUS (SEPSIS): 1000.0000 mL | Freq: Once | INTRAVENOUS | Status: DC

## 2018-07-11 MED ORDER — LEVOFLOXACIN 750 MG PO TABS: 750.0000 mg | ORAL_TABLET | ORAL | Status: DC

## 2018-07-11 MED ORDER — SODIUM CHLORIDE 0.9 % IV BOLUS (SEPSIS): 250.0000 mL | Freq: Once | INTRAVENOUS | Status: DC

## 2018-07-11 MED ORDER — SODIUM CHLORIDE 0.9 % IV BOLUS
1000.0000 mL | Freq: Once | INTRAVENOUS | Status: AC
  Administered 2018-07-11: 1000 mL via INTRAVENOUS

## 2018-07-11 MED ORDER — SODIUM CHLORIDE 0.9 % IV BOLUS: 2000.0000 mL | Freq: Once | INTRAVENOUS | Status: DC

## 2018-07-11 MED ORDER — ACETAMINOPHEN 325 MG PO TABS
650.0000 mg | ORAL_TABLET | Freq: Once | ORAL | Status: AC
  Administered 2018-07-11: 650 mg via ORAL
  Filled 2018-07-11: qty 2

## 2018-07-11 NOTE — ED Notes (Signed)
EKG taken by EMT , pt. reports chest pain while at waiting area with nausea/emesis.

## 2018-07-11 NOTE — ED Notes (Signed)
Pt not in room, Code Sepsis called 20 minutes ago, Pt hadn't been brought back from lobby.

## 2018-07-11 NOTE — H&P (Addendum)
History and Physical    Desiree [name] NOM:[id number] DOB: [birth year] DOA: 07/11/2018  PCP: [name], MD   Patient coming from:Home  Chief Complaint: SOB, cough  HPI: Desiree [name] is a 67 y.o. female with medical history significant for brochiectasis COPD, DM, HTN, BPD, who presented to the ED with complaints of increased shortness of breath, worsening cough with increased sputum purulence and colour change to yellow.  She reports chronic chest pain of at least 6 months to a year, lower sternal and rib cage bilaterally 3-year, sometimes wakes her up from sleep, unrelated to activity, without associated shortness of breath diaphoresis nausea or vomiting.  No swelling to lower extremities no recent travels.    ED Course: Temp 100.5, tachycardia to 114, intermittent tachypnea to 29, O2 sats dropped to 87% on room air so was placed on 2 L nasal cannula. (Not on home O2).  WBC elevated 27.  Creatinine about baseline 1.3.  Remarkable EKG. Lactic acid 1.1.  Two-view chest x-ray showed chronic mild lung disease and bibasilar atelectasis.  Patient meeting SIRS criteria to liter bolus normal saline given IV Levaquin per pharmacy started, hospitalist called to admit for probable pneumonia.   Review of Systems: As per HPI all other systems reviewed and negative  Past Medical History:  Diagnosis Date  . Anxiety   . Bipolar disorder ([place])   . CANDIDIASIS, ESOPHAGEAL 07/28/2009   Qualifier: Diagnosis of  By: [name] MD, [name]   . Chronic kidney disease    CKD III  . COPD (chronic obstructive pulmonary disease) ([place])   . Depression   . Diabetes mellitus without complication (HCC)    no meds  . FH: colonic polyps   . Fractured elbow    right   . GERD (gastroesophageal reflux disease)   . HH (hiatus hernia)   . History of carpal tunnel syndrome   . History of chest pain   . History of transfusion of packed red blood cells   . Hyperlipidemia   . Hypertension   . Neuroleptic-induced tardive  dyskinesia   . Osteoarthritis of more than one site   . Seasonal allergies     Past Surgical History:  Procedure Laterality Date  . ANTERIOR CERVICAL DECOMP/DISCECTOMY FUSION  09/27/2016   C5-6 anterior cervical discectomy and fusion, allograft and plate/notes 09/27/2016  . ANTERIOR CERVICAL DECOMP/DISCECTOMY FUSION N/A 09/27/2016   Procedure: C5-6 Anterior Cervical Discectomy and Fusion, Allograft and Plate;  Surgeon: [name], MD;  Location: [facility];  Service: Orthopedics;  Laterality: N/A;  . Back Fusion  2002  . CARPAL TUNNEL RELEASE  yates   left  . COLONOSCOPY N/A 01/05/2014   Procedure: COLONOSCOPY;  Surgeon: [name], MD;  Location: [facility];  Service: Endoscopy;  Laterality: N/A;  . ELBOW SURGERY     age 74  . EXTERNAL EAR SURGERY Left   . EYE SURGERY     "removed white dots under eyelid"  . FINGER SURGERY Left   . Juvara osteomy    . KNEE SURGERY    . NOSE SURGERY    . Rt. toe bunion    . skin, shave biopsy  05/03/2016   Left occipital scalp, top of scalp     reports that she quit smoking about 16 years ago. Her smoking use included cigarettes. She has a 60.00 pack-year smoking history. She has never used smokeless tobacco. She reports that she does not drink alcohol or use drugs.  Allergies  Allergen Reactions  .  Amoxicillin Hives and Rash    Has patient had a PCN reaction causing immediate rash, facial/tongue/throat swelling, SOB or lightheadedness with hypotension:Yes Has patient had a PCN reaction causing severe rash involving mucus membranes or skin necrosis: NO Has patient had a PCN reaction that required hospitalization NO Has patient had a PCN reaction occurring within the last 10 years: NO If all of the above answers are "NO", then may proceed with Cephalosporin use.   . Codeine Hives, Itching and Rash  . Hydrocodone-Acetaminophen Hives, Itching and Rash  . Penicillins Hives, Itching and Rash    ALLERGIC REACTION TO ORAL AMOXICILLIN Has patient had  a PCN reaction causing immediate rash, facial/tongue/throat swelling, SOB or lightheadedness with hypotension: Yes Has patient had a PCN reaction causing severe rash involving mucus membranes or skin necrosis: No Has patient had a PCN reaction that required hospitalization: No Has patient had a PCN reaction occurring within the last 10 years: No If all of the above answers are "NO", then may proceed with Cephalosporin use.  . Prednisone Itching    Family History  Problem Relation Age of Onset  . Heart attack Father   . Heart disease Father   . Throat cancer Brother   . Diabetes Mother   . Hypertension Mother   . Heart attack Mother   . Diabetes type II Brother   . Heart disease Brother   . Lung cancer Brother   . Breast cancer Cousin   . Lung cancer Daughter   . Lung cancer Paternal Uncle     Prior to Admission medications   Medication Sig Start Date End Date Taking? Authorizing Provider  acetaminophen (TYLENOL) 500 MG tablet Take 2 tablets (1,000 mg total) by mouth every 6 (six) hours as needed. Patient taking differently: Take 1,000 mg by mouth every 6 (six) hours as needed for mild pain or headache.  09/13/15  Yes Pfeiffer, Jeannie Done, MD  atorvastatin (LIPITOR) 20 MG tablet TAKE 1 TABLET BY MOUTH ONCE DAILY 06/27/18  Yes [name], [name], MD  cetirizine (ZYRTEC) 10 MG tablet Take 10 mg by mouth at bedtime.    Yes [provider]  diclofenac sodium (VOLTAREN) 1 % GEL Apply 3 grams to 3 large joints, up to 3 times daily. Patient taking differently: Apply 3 g topically See admin instructions. Apply 3 grams to 3 large joints, up to 3 times daily 01/29/18  Yes Deveshwar, Abel Presto, MD  docusate sodium (COLACE) 100 MG capsule Take 100 mg by mouth at bedtime.    Yes [provider]  DULoxetine (CYMBALTA) 60 MG capsule Take 1 capsule (60 mg total) by mouth daily. Patient taking differently: Take 60 mg by mouth 2 (two) times daily.  09/01/14  Yes Akintayo, Mojeed, MD  fluticasone  (FLONASE) 50 MCG/ACT nasal spray Place 1-2 sprays into both nostrils at bedtime.    Yes [provider]  guaiFENesin (MUCINEX) 600 MG 12 hr tablet Take 600 mg by mouth at bedtime.   Yes [provider]  ipratropium-albuterol (DUONEB) 0.5-2.5 (3) MG/3ML SOLN INHALE CONTENTS OF 1 VIAL THREE TIMES A DAY IN NEBULIZER Patient taking differently: Take 3 mLs by nebulization 3 (three) times daily.  05/06/18  Yes [name], MD  lamoTRIgine (LAMICTAL) 25 MG tablet Take 1 tablet (25 mg total) by mouth 2 (two) times daily. 10/06/14  Yes [name], PA-C  linaclotide (LINZESS) 145 MCG CAPS capsule Take 145 mcg by mouth daily before breakfast.   Yes [provider]  losartan (COZAAR)  50 MG tablet TAKE 1 TABLET BY MOUTH ONCE DAILY 07/04/18  Yes [name], [name], MD  montelukast (SINGULAIR) 10 MG tablet Take 1 tablet (10 mg total) at bedtime by mouth. 08/29/17  Yes [name], MD  Multiple Vitamins-Minerals (CENTRUM SILVER PO) Take 1 tablet by mouth daily.    Yes [provider]  NON FORMULARY FDgard: Take 1 capsule by mouth at bedtime   Yes [provider]  NON FORMULARY Respivest: Three times a day for 30 minutes   Yes [provider]  ondansetron (ZOFRAN-ODT) 4 MG disintegrating tablet Take 1 tablet (4 mg total) by mouth every 8 (eight) hours as needed for nausea or vomiting. 06/29/16  Yes [name], [name], MD  pantoprazole (PROTONIX) 40 MG tablet Take 40 mg by mouth daily.   Yes [provider]  Peppermint Oil (IBGARD PO) Take 1 capsule by mouth every morning.   Yes [provider]  Probiotic Product (ALIGN PO) Take 1 capsule by mouth daily with breakfast.    Yes [provider]  Respiratory Therapy Supplies (FLUTTER) DEVI 1 Device by Does not apply route as directed. Patient taking differently: 1 Device as directed.  10/24/17  Yes Parrett, Tammy S, NP  sodium chloride HYPERTONIC 3 % nebulizer solution Take by  nebulization 2 (two) times daily. Patient taking differently: Take 4 mLs by nebulization 2 (two) times daily.  01/18/18  Yes McQuaid, Ronie Spies, MD  umeclidinium-vilanterol (ANORO ELLIPTA) 62.5-25 MCG/INH AEPB Inhale 1 puff into the lungs daily. 12/05/17  Yes [name], MD  Blood Glucose Monitoring Suppl (ACCU-CHEK NANO SMARTVIEW) W/DEVICE KIT Use to check blood sugar 1-2 times daily Patient taking differently: 1 strip by Other route 2 (two) times a week.  11/26/14   [name], [name], MD  glucose blood test strip Accu-chek nano, test glucose once daily, dx E11.9 03/28/17   [name], [name], MD  Lancets Misc. (ACCU-CHEK FASTCLIX LANCET) KIT USE TO CHECK BLOOD SUGAR 1-2 TIMES DAILY 12/28/15   [name], [name], MD  losartan (COZAAR) 25 MG tablet take 1 tablet by mouth once daily Patient not taking: Reported on 07/11/2018 06/22/17   [name], MD    Physical Exam: Vitals:   07/11/18 2230 07/11/18 2248 07/11/18 2300 07/11/18 2315  BP: 132/66 134/72 (!) 156/88 103/84  Pulse: (!) 109 95 98 97  Resp: (!) 24 17 (!) 22 (!) 21  Temp:      TempSrc:      SpO2: 98% 99% 100% 97%  Weight:      Height:        Constitutional: NAD, calm, comfortable Vitals:   07/11/18 2230 07/11/18 2248 07/11/18 2300 07/11/18 2315  BP: 132/66 134/72 (!) 156/88 103/84  Pulse: (!) 109 95 98 97  Resp: (!) 24 17 (!) 22 (!) 21  Temp:      TempSrc:      SpO2: 98% 99% 100% 97%  Weight:      Height:       Eyes: PERRL, lids and conjunctivae normal ENMT: Mucous membranes are moist. Posterior pharynx clear of any exudate or lesions. Neck: normal, supple, no masses, no thyromegaly Respiratory: Diffuse rhonchi, congested. Normal respiratory effort. No accessory muscle use.  Cardiovascular: Regular rate and rhythm, no murmurs / rubs / gallops. No extremity edema. 2+ pedal pulses. No carotid bruits.  Abdomen: no tenderness, no masses palpated. No hepatosplenomegaly. Bowel sounds positive.  Musculoskeletal: no clubbing /  cyanosis. No joint deformity upper and lower  extremities. Good ROM, no contractures. Normal muscle tone.  Skin: no rashes, lesions, ulcers. No induration Neurologic: CN 2-12 grossly intact.  Strength 5/5 in all 4.  Psychiatric: Normal judgment and insight. Normal mood.   Labs on Admission: I have personally reviewed following labs and imaging studies  CBC: Recent Labs  Lab 07/11/18 1732  WBC 27.6*  HGB 12.7  HCT 40.1  MCV 95.9  PLT 863   Basic Metabolic Panel: Recent Labs  Lab 07/11/18 1732  NA 140  K 4.6  CL 101  CO2 25  GLUCOSE 77  BUN 12  CREATININE 1.32*  CALCIUM 9.3   Urine analysis:    Component Value Date/Time   COLORURINE DARK YELLOW 11/15/2017 1105   APPEARANCEUR CLEAR 11/15/2017 1105   LABSPEC 1.028 11/15/2017 1105   PHURINE 5.5 11/15/2017 1105   GLUCOSEU NEGATIVE 11/15/2017 1105   HGBUR NEGATIVE 11/15/2017 1105   HGBUR negative 03/11/2008 1317   BILIRUBINUR n 08/07/2017 [place] 11/15/2017 1105   PROTEINUR 1+ (A) 11/15/2017 1105   UROBILINOGEN 1.0 08/07/2017 1558   UROBILINOGEN 0.2 08/16/2014 1513   NITRITE NEGATIVE 11/15/2017 1105   LEUKOCYTESUR NEGATIVE 11/15/2017 1105    Radiological Exams on Admission: Dg Chest 2 View  Result Date: 07/11/2018 CLINICAL DATA:  Nausea vomiting and headache x1 week EXAM: CHEST - 2 VIEW COMPARISON:  08/22/2017 FINDINGS: Heart size is normal. There is mild aortic atherosclerosis without aneurysm. No acute pulmonary consolidation. Mild chronic interstitial prominence and bibasilar atelectasis is identified. No overt pulmonary edema. ACDF of the lower cervical spine. No acute osseous abnormality. IMPRESSION: 1. Chronic mild interstitial lung disease and bibasilar atelectasis. 2. Minimal aortic atherosclerosis. 3. No acute pulmonary disease Electronically Signed   By: Ashley Royalty M.D.   On: 07/11/2018 18:08    EKG: Independently reviewed.  Sinus tachycardia, normal intervals.  Assessment/Plan Active  Problems:   Diabetes mellitus with renal manifestations, controlled ([place])   Hypertension   Bipolar I disorder with mania ([place])   Bronchiectasis without complication (HCC)   COPD exacerbation (HCC)  Pneumonia- SOB, productive cough, tachycardia, mild hypoxia to 87%, febrile to 100.5.  Significant leukocytosis of 27. 2- view chest x-ray chronic mild interstitial disease bibasilar atelectasis.  Patient meets SIRS criteria.  IV Levaquin started in ED -Will get chest CT, considering significant leukocytosis, and chest x-ray findings-areas of consolidation in the lung bases more prominent on the right suggestive of interval development of acute bronchopneumonia, cachectic cyst with mucous plugging in the lung bases. -Will use IV ceftriaxone and IV azithromycin -CBC a.m. -Follow-up blood cultures, urine cultures -Chest physiotherapy for mucus plugging -Hydrate 1/2 normal saline 100cc/hr X 12hrs.  COPD exacerbation, -likely provoked by pneumonia.  -DuoNeb scheduled, PRN -IV solmedrol 40 BID -Muucolytics, supplemental O2 -Continue home Flonase   HTN-systolic range 817-711A. -Continue home losartan  CKD3-creatinine 1.3.  Stable.  DM-glucose 177.  Hemoglobin A1c 5.93/2019.  Not on antihypoglycemic meds - SSI.  DVT prophylaxis: Lovenox Code Status: Full Family Communication: None at bedside Disposition Plan: Per rounding team Consults called: None Admission status: Inpt, tele   Bethena Roys MD [facility] Pager [phone] From 6PM-2AM.  Otherwise please contact night-coverage [email or web address] Password [facility]  07/11/2018, 12:54 AM

## 2018-07-11 NOTE — ED Provider Notes (Signed)
[facility] EMERGENCY DEPARTMENT Provider Note   CSN: [id number] Arrival date & time: 07/11/18  1707     History   Chief Complaint Chief Complaint  Patient presents with  . Chest Pain  . Emesis    HPI Desiree [name] is a 67 y.o. female history of diabetes, CKD, COPD, previous bronchiectasis who presenting with cough, fever, chills.  Patient states that she has nonproductive cough for the last week or so.  She has intermittent chills and subjective fever for the last week as well.  Patient also has some nausea as well and epigastric pain.  Patient denies any urinary symptoms.  Patient felt like it is an exacerbation of her bronchiectasis.  Patient follows with pulmonology for her bronchiectasis and is only currently on breathing treatments as needed.  Patient was noted to be febrile and tachycardic and hypoxic in triage.  Sepsis work-up was initiated.  The history is provided by the patient.    Past Medical History:  Diagnosis Date  . Anxiety   . Bipolar disorder ([place])   . CANDIDIASIS, ESOPHAGEAL 07/28/2009   Qualifier: Diagnosis of  By: [name] MD, [name]   . Chronic kidney disease    CKD III  . COPD (chronic obstructive pulmonary disease) ([place])   . Depression   . Diabetes mellitus without complication (HCC)    no meds  . FH: colonic polyps   . Fractured elbow    right   . GERD (gastroesophageal reflux disease)   . HH (hiatus hernia)   . History of carpal tunnel syndrome   . History of chest pain   . History of transfusion of packed red blood cells   . Hyperlipidemia   . Hypertension   . Neuroleptic-induced tardive dyskinesia   . Osteoarthritis of more than one site   . Seasonal allergies     Patient Active Problem List   Diagnosis Date Noted  . COPD exacerbation ([place]) 07/11/2018  . Primary osteoarthritis of both feet 12/21/2017  . DDD (degenerative disc disease), lumbar 12/21/2017  . Primary osteoarthritis of both knees 12/21/2017  . History of  bilateral carpal tunnel release 12/21/2017  . DDD (degenerative disc disease), cervical 11/15/2017  . Former smoker 11/15/2017  . Bronchiectasis without complication ([place]) 42/59/5638  . Impingement syndrome of right shoulder 07/25/2017  . Hx of fusion of cervical spine 07/25/2017  . Cervical spinal stenosis 09/27/2016  . Closed displaced fracture of proximal phalanx of lesser toe of right foot 09/08/2016  . Spinal stenosis of cervical region 09/08/2016  . Polypharmacy 06/23/2015  . Hyperlipidemia 04/19/2015  . Visit for preventive health examination 01/01/2015  . Primary osteoarthritis involving multiple joints 01/01/2015  . Bipolar affective disorder, currently depressed, moderate ([place])   . Bipolar I disorder with mania ([place]) 08/17/2014  . Foot cramps 07/03/2014  . Hypertension 10/21/2013  . Ear fullness 10/21/2013  . Dizziness 03/25/2013  . Medication withdrawal ([place]) 03/05/2013  . Diabetes mellitus with renal manifestations, controlled ([place]) 11/10/2012  . Alopecia areata 02/06/2012  . Obesity (BMI 30-39.9) 02/06/2012  . Renal insufficiency 07/16/2011  . Neuroleptic-induced tardive dyskinesia   . Exertional dyspnea 02/07/2011  . Dyspnea on exertion 01/19/2011  . DIABETES MELLITUS, TYPE II, CONTROLLED 04/22/2010  . Carpal tunnel syndrome 02/02/2010  . CONSTIPATION 02/02/2010  . Primary osteoarthritis of both hands 02/02/2010  . CATARACTS 04/13/2009  . PAIN IN JOINT, ANKLE AND FOOT 09/16/2008  . EOSINOPHILIA 07/21/2008  . AFFECTIVE DISORDER 04/21/2008  . BACK PAIN, CHRONIC 02/12/2008  .  LEG PAIN 02/12/2008  . ABNORMAL INVOLUNTARY MOVEMENTS 12/04/2007  . POSTURAL LIGHTHEADEDNESS 11/04/2007  . COLONIC POLYPS, HX OF 11/04/2007  . Essential hypertension 06/17/2007  . HYPERLIPIDEMIA 04/08/2007  . MITRAL VALVE PROLAPSE 04/08/2007  . GERD 04/08/2007  . LOW BACK PAIN SYNDROME 04/08/2007  . CHEST PAIN, RECURRENT 04/08/2007    Past Surgical History:  Procedure Laterality Date    . ANTERIOR CERVICAL DECOMP/DISCECTOMY FUSION  09/27/2016   C5-6 anterior cervical discectomy and fusion, allograft and plate/notes 09/27/2016  . ANTERIOR CERVICAL DECOMP/DISCECTOMY FUSION N/A 09/27/2016   Procedure: C5-6 Anterior Cervical Discectomy and Fusion, Allograft and Plate;  Surgeon: [name], MD;  Location: [facility];  Service: Orthopedics;  Laterality: N/A;  . Back Fusion  2002  . CARPAL TUNNEL RELEASE  yates   left  . COLONOSCOPY N/A 01/05/2014   Procedure: COLONOSCOPY;  Surgeon: [name], MD;  Location: [facility];  Service: Endoscopy;  Laterality: N/A;  . ELBOW SURGERY     age 25  . EXTERNAL EAR SURGERY Left   . EYE SURGERY     "removed white dots under eyelid"  . FINGER SURGERY Left   . Juvara osteomy    . KNEE SURGERY    . NOSE SURGERY    . Rt. toe bunion    . skin, shave biopsy  05/03/2016   Left occipital scalp, top of scalp     OB History   None      Home Medications    Prior to Admission medications   Medication Sig Start Date End Date Taking? Authorizing Provider  acetaminophen (TYLENOL) 500 MG tablet Take 2 tablets (1,000 mg total) by mouth every 6 (six) hours as needed. Patient taking differently: Take 1,000 mg by mouth every 6 (six) hours as needed for mild pain or headache.  09/13/15  Yes Pfeiffer, Jeannie Done, MD  atorvastatin (LIPITOR) 20 MG tablet TAKE 1 TABLET BY MOUTH ONCE DAILY 06/27/18  Yes [name], [name], MD  cetirizine (ZYRTEC) 10 MG tablet Take 10 mg by mouth at bedtime.    Yes [provider]  diclofenac sodium (VOLTAREN) 1 % GEL Apply 3 grams to 3 large joints, up to 3 times daily. Patient taking differently: Apply 3 g topically See admin instructions. Apply 3 grams to 3 large joints, up to 3 times daily 01/29/18  Yes Deveshwar, Abel Presto, MD  docusate sodium (COLACE) 100 MG capsule Take 100 mg by mouth at bedtime.    Yes [provider]  DULoxetine (CYMBALTA) 60 MG capsule Take 1 capsule (60 mg total) by mouth daily. Patient  taking differently: Take 60 mg by mouth 2 (two) times daily.  09/01/14  Yes Akintayo, Mojeed, MD  fluticasone (FLONASE) 50 MCG/ACT nasal spray Place 1-2 sprays into both nostrils at bedtime.    Yes [provider]  guaiFENesin (MUCINEX) 600 MG 12 hr tablet Take 600 mg by mouth at bedtime.   Yes [provider]  ipratropium-albuterol (DUONEB) 0.5-2.5 (3) MG/3ML SOLN INHALE CONTENTS OF 1 VIAL THREE TIMES A DAY IN NEBULIZER Patient taking differently: Take 3 mLs by nebulization 3 (three) times daily.  05/06/18  Yes [name], MD  lamoTRIgine (LAMICTAL) 25 MG tablet Take 1 tablet (25 mg total) by mouth 2 (two) times daily. 10/06/14  Yes [name], PA-C  linaclotide (LINZESS) 145 MCG CAPS capsule Take 145 mcg by mouth daily before breakfast.   Yes [provider]  losartan (COZAAR) 50 MG tablet TAKE 1 TABLET BY MOUTH ONCE DAILY 07/04/18  Yes [name], [name], MD  montelukast (SINGULAIR) 10 MG tablet Take 1 tablet (10 mg total) at bedtime by mouth. 08/29/17  Yes [name], MD  Multiple Vitamins-Minerals (CENTRUM SILVER PO) Take 1 tablet by mouth daily.    Yes [provider]  NON FORMULARY FDgard: Take 1 capsule by mouth at bedtime   Yes [provider]  NON FORMULARY Respivest: Three times a day for 30 minutes   Yes [provider]  ondansetron (ZOFRAN-ODT) 4 MG disintegrating tablet Take 1 tablet (4 mg total) by mouth every 8 (eight) hours as needed for nausea or vomiting. 06/29/16  Yes [name], [name], MD  pantoprazole (PROTONIX) 40 MG tablet Take 40 mg by mouth daily.   Yes [provider]  Peppermint Oil (IBGARD PO) Take 1 capsule by mouth every morning.   Yes [provider]  Probiotic Product (ALIGN PO) Take 1 capsule by mouth daily with breakfast.    Yes [provider]  Respiratory Therapy Supplies (FLUTTER) DEVI 1 Device by Does not apply route as directed. Patient taking differently: 1 Device as  directed.  10/24/17  Yes Parrett, Tammy S, NP  sodium chloride HYPERTONIC 3 % nebulizer solution Take by nebulization 2 (two) times daily. Patient taking differently: Take 4 mLs by nebulization 2 (two) times daily.  01/18/18  Yes McQuaid, Ronie Spies, MD  umeclidinium-vilanterol (ANORO ELLIPTA) 62.5-25 MCG/INH AEPB Inhale 1 puff into the lungs daily. 12/05/17  Yes [name], MD  Blood Glucose Monitoring Suppl (ACCU-CHEK NANO SMARTVIEW) W/DEVICE KIT Use to check blood sugar 1-2 times daily Patient taking differently: 1 strip by Other route 2 (two) times a week.  11/26/14   [name], [name], MD  glucose blood test strip Accu-chek nano, test glucose once daily, dx E11.9 03/28/17   [name], [name], MD  Lancets Misc. (ACCU-CHEK FASTCLIX LANCET) KIT USE TO CHECK BLOOD SUGAR 1-2 TIMES DAILY 12/28/15   [name], [name], MD  losartan (COZAAR) 25 MG tablet take 1 tablet by mouth once daily Patient not taking: Reported on 07/11/2018 06/22/17   [name], [name], MD    Family History Family History  Problem Relation Age of Onset  . Heart attack Father   . Heart disease Father   . Throat cancer Brother   . Diabetes Mother   . Hypertension Mother   . Heart attack Mother   . Diabetes type II Brother   . Heart disease Brother   . Lung cancer Brother   . Breast cancer Cousin   . Lung cancer Daughter   . Lung cancer Paternal Uncle     Social History Social History   Tobacco Use  . Smoking status: Former Smoker    Packs/day: 2.00    Years: 30.00    Pack years: 60.00    Types: Cigarettes    Last attempt to quit: 10/23/2001    Years since quitting: 16.7  . Smokeless tobacco: Never Used  Substance Use Topics  . Alcohol use: No  . Drug use: No     Allergies   Amoxicillin; Codeine; Hydrocodone-acetaminophen; Penicillins; and Prednisone   Review of Systems Review of Systems  Respiratory: Positive for cough.   Cardiovascular: Positive for chest pain.  Gastrointestinal: Positive for vomiting.  All  other systems reviewed and are negative.    Physical Exam Updated Vital Signs BP (!) 157/87   Pulse 98   Temp (!) 100.5 F (38.1 C) (Rectal)   Resp (!) 21   Ht  5' 2"  (1.575 m)   Wt 67.6 kg   SpO2 97%   BMI 27.26 kg/m   Physical Exam  Constitutional: She is oriented to person, place, and time.  Uncomfortable, dehydrated   HENT:  Head: Normocephalic.  MM slightly dry   Eyes: Pupils are equal, round, and reactive to light.  Neck: Normal range of motion.  Cardiovascular: Intact distal pulses and normal pulses.  Tachycardic   Pulmonary/Chest:  Slightly tachypneic, crackles L base   Abdominal: Soft.  Mild epigastric tenderness, no CVAT   Musculoskeletal: Normal range of motion.       Right lower leg: Normal.       Left lower leg: Normal.  Neurological: She is alert and oriented to person, place, and time.  Skin: Skin is warm. Capillary refill takes less than 2 seconds.  Psychiatric: She has a normal mood and affect. Her behavior is normal.  Nursing note and vitals reviewed.    ED Treatments / Results  Labs (all labs ordered are listed, but only abnormal results are displayed) Labs Reviewed  BASIC METABOLIC PANEL - Abnormal; Notable for the following components:      Result Value   Creatinine, Ser 1.32 (*)    GFR calc non Af Amer 41 (*)    GFR calc Af Amer 48 (*)    All other components within normal limits  CBC - Abnormal; Notable for the following components:   WBC 27.6 (*)    All other components within normal limits  CULTURE, BLOOD (ROUTINE X 2)  CULTURE, BLOOD (ROUTINE X 2)  URINE CULTURE  URINALYSIS, ROUTINE W REFLEX MICROSCOPIC  HEPATIC FUNCTION PANEL  LIPASE, BLOOD  I-STAT TROPONIN, ED  I-STAT CG4 LACTIC ACID, ED  I-STAT CG4 LACTIC ACID, ED    EKG EKG Interpretation  Date/Time:  Thursday July 11 2018 19:58:35 EDT Ventricular Rate:  109 PR Interval:  128 QRS Duration: 84 QT Interval:  348 QTC Calculation: 468 R Axis:   -52 Text  Interpretation:  Sinus tachycardia Left axis deviation Septal infarct , age undetermined Abnormal ECG When compared with ECG of EARLIER SAME DATE No significant change was found Confirmed by Delora Fuel ([id number]) on 07/12/2018 12:00:31 AM   Radiology Dg Chest 2 View  Result Date: 07/11/2018 CLINICAL DATA:  Nausea vomiting and headache x1 week EXAM: CHEST - 2 VIEW COMPARISON:  08/22/2017 FINDINGS: Heart size is normal. There is mild aortic atherosclerosis without aneurysm. No acute pulmonary consolidation. Mild chronic interstitial prominence and bibasilar atelectasis is identified. No overt pulmonary edema. ACDF of the lower cervical spine. No acute osseous abnormality. IMPRESSION: 1. Chronic mild interstitial lung disease and bibasilar atelectasis. 2. Minimal aortic atherosclerosis. 3. No acute pulmonary disease Electronically Signed   By: Ashley Royalty M.D.   On: 07/11/2018 18:08   Ct Chest Wo Contrast  Result Date: 07/11/2018 CLINICAL DATA:  Nausea and vomiting for 1 week. Recurrent chest pain for 6 months. Shortness of breath and dizziness with exertion. Productive cough. EXAM: CT CHEST WITHOUT CONTRAST TECHNIQUE: Multidetector CT imaging of the chest was performed following the standard protocol without IV contrast. COMPARISON:  High-resolution chest CT 09/26/2017 FINDINGS: Cardiovascular: Normal heart size. Minimal pericardial effusion. Normal caliber thoracic aorta with scattered calcifications. Mediastinum/Nodes: Mild prominence of mediastinal lymph nodes with pretracheal nodes measuring up to about 10 mm short axis dimension. Similar appearance to previous study. Likely to be reactive. Esophagus is decompressed. Thyroid gland is unremarkable. Lungs/Pleura: Scattered areas of interstitial fibrosis in the lungs. Bronchial wall  thickening suggesting chronic bronchitis. Bronchiectasis with mucous plugging in the lung bases. Patchy focal areas of consolidation in the lung bases, more prominent on the  right. Consolidative changes are new since previous study suggesting interval development of an acute bronchopneumonia. No pleural effusions. No pneumothorax. Upper Abdomen: No acute changes identified. Vascular calcifications. Musculoskeletal: Degenerative changes in the thoracic spine. No destructive bone lesions. IMPRESSION: 1. Patchy areas of consolidation in the lung bases, more prominent on the right, suggesting interval development of acute bronchopneumonia. 2. Chronic bronchitic changes with bronchiectasis and mucous plugging in the lung bases. 3. Mild mediastinal lymphadenopathy, likely reactive. 4. Minimal pericardial effusion. Aortic Atherosclerosis (ICD10-I70.0). Electronically Signed   By: Lucienne Capers M.D.   On: 07/11/2018 23:55    Procedures Procedures (including critical care time)  Medications Ordered in ED Medications  levofloxacin (LEVAQUIN) tablet 750 mg (has no administration in time range)  ondansetron (ZOFRAN-ODT) disintegrating tablet 4 mg (4 mg Oral Given 07/11/18 1957)  levofloxacin (LEVAQUIN) IVPB 750 mg (750 mg Intravenous New Bag/Given 07/11/18 2222)  sodium chloride 0.9 % bolus 1,000 mL (0 mLs Intravenous Stopped 07/11/18 2258)  sodium chloride 0.9 % bolus 1,000 mL (1,000 mLs Intravenous New Bag/Given 07/11/18 2300)  acetaminophen (TYLENOL) tablet 650 mg (650 mg Oral Given 07/11/18 2254)     Initial Impression / Assessment and Plan / ED Course  I have reviewed the triage vital signs and the nursing notes.  Pertinent labs & imaging results that were available during my care of the patient were reviewed by me and considered in my medical decision making (see chart for details).     Desiree [name] is a 67 y.o. female here with cough, fever. Patient is febrile, tachycardic, hypoxic in triage. Code sepsis initiated. Suspected source is pneumonia vs bronchiectasis. Will give 30 cc/kg bolus, levaquin empirically. Will get labs, lactate, cultures, CXR.   10:45 pm WBC  27. Lactate nl. CXR showed atelectasis. Clinically, she has pneumonia. She meets SIRS criteria for admission for sepsis. Placed on 2 L [place] and O2 came up to 98%. Will admit for CAP.   Final Clinical Impressions(s) / ED Diagnoses   Final diagnoses:  Community acquired pneumonia of left lower lobe of lung [facility])    ED Discharge Orders    None       Drenda Freeze, MD 07/12/18 0009

## 2018-07-11 NOTE — ED Triage Notes (Signed)
Pt reports nausea and vomiting x 1  Week. Pt reports recurrent chest pain for six months. Pt reports SHOB and dizziness with exertion. Pt also reports she has a productive cough with yellow sputum.

## 2018-07-11 NOTE — Progress Notes (Addendum)
Pharmacy Antibiotic Note  Desiree [name] is a 67 y.o. female admitted on 07/11/2018 with pneumonia.  Pharmacy has been consulted for levofloxacin dosing. Patient has a documented penicillin allergy. WBC 27.6, Tmax 100.1, sCr 1.32, CrCl~38  Plan: Levofloxacin 750mg  x1 in ED, then 750mg  PO Q48H F/u LOT/de-escalation, renal function, and cultures   Height: 5\' 2"  (157.5 cm) IBW/kg (Calculated) : 50.1  Temp (24hrs), Avg:99.9 F (37.7 C), Min:99.7 F (37.6 C), Max:100.1 F (37.8 C)  Recent Labs  Lab 07/11/18 1732  WBC 27.6*  CREATININE 1.32*    CrCl cannot be calculated (Unknown ideal weight.).    Allergies  Allergen Reactions  . Amoxicillin Hives and Rash    Has patient had a PCN reaction causing immediate rash, facial/tongue/throat swelling, SOB or lightheadedness with hypotension:Yes Has patient had a PCN reaction causing severe rash involving mucus membranes or skin necrosis: NO Has patient had a PCN reaction that required hospitalization NO Has patient had a PCN reaction occurring within the last 10 years: NO If all of the above answers are "NO", then may proceed with Cephalosporin use.   . Codeine Hives, Itching and Rash  . Hydrocodone-Acetaminophen Hives, Itching and Rash  . Penicillins Hives, Itching and Rash    ALLERGIC REACTION TO ORAL AMOXICILLIN Has patient had a PCN reaction causing immediate rash, facial/tongue/throat swelling, SOB or lightheadedness with hypotension: Yes Has patient had a PCN reaction causing severe rash involving mucus membranes or skin necrosis: No Has patient had a PCN reaction that required hospitalization: No Has patient had a PCN reaction occurring within the last 10 years: No If all of the above answers are "NO", then may proceed with Cephalosporin use.  . Prednisone Itching    Antimicrobials this admission: levofloxacin 9/19 >>   Dose adjustments this admission:   Microbiology results: 9/19 BCx:  9/19 UCx  Thank you for allowing  pharmacy to be a part of this patient's care.  10/19, PharmD PGY1 Pharmacy Resident 07/11/2018    10:22 PM

## 2018-07-12 ENCOUNTER — Encounter (HOSPITAL_COMMUNITY): Payer: Self-pay

## 2018-07-12 DIAGNOSIS — E1122 Type 2 diabetes mellitus with diabetic chronic kidney disease: Secondary | ICD-10-CM

## 2018-07-12 DIAGNOSIS — M159 Polyosteoarthritis, unspecified: Secondary | ICD-10-CM | POA: Diagnosis present

## 2018-07-12 DIAGNOSIS — Z833 Family history of diabetes mellitus: Secondary | ICD-10-CM | POA: Diagnosis not present

## 2018-07-12 DIAGNOSIS — F419 Anxiety disorder, unspecified: Secondary | ICD-10-CM | POA: Diagnosis present

## 2018-07-12 DIAGNOSIS — J181 Lobar pneumonia, unspecified organism: Secondary | ICD-10-CM | POA: Diagnosis present

## 2018-07-12 DIAGNOSIS — Z801 Family history of malignant neoplasm of trachea, bronchus and lung: Secondary | ICD-10-CM | POA: Diagnosis not present

## 2018-07-12 DIAGNOSIS — Z7951 Long term (current) use of inhaled steroids: Secondary | ICD-10-CM | POA: Diagnosis not present

## 2018-07-12 DIAGNOSIS — G2401 Drug induced subacute dyskinesia: Secondary | ICD-10-CM | POA: Diagnosis present

## 2018-07-12 DIAGNOSIS — M5136 Other intervertebral disc degeneration, lumbar region: Secondary | ICD-10-CM | POA: Diagnosis present

## 2018-07-12 DIAGNOSIS — E785 Hyperlipidemia, unspecified: Secondary | ICD-10-CM | POA: Diagnosis present

## 2018-07-12 DIAGNOSIS — K219 Gastro-esophageal reflux disease without esophagitis: Secondary | ICD-10-CM | POA: Diagnosis present

## 2018-07-12 DIAGNOSIS — Z87891 Personal history of nicotine dependence: Secondary | ICD-10-CM | POA: Diagnosis not present

## 2018-07-12 DIAGNOSIS — J189 Pneumonia, unspecified organism: Secondary | ICD-10-CM

## 2018-07-12 DIAGNOSIS — Z8249 Family history of ischemic heart disease and other diseases of the circulatory system: Secondary | ICD-10-CM | POA: Diagnosis not present

## 2018-07-12 DIAGNOSIS — I422 Other hypertrophic cardiomyopathy: Secondary | ICD-10-CM | POA: Diagnosis present

## 2018-07-12 DIAGNOSIS — Z23 Encounter for immunization: Secondary | ICD-10-CM | POA: Diagnosis present

## 2018-07-12 DIAGNOSIS — Z981 Arthrodesis status: Secondary | ICD-10-CM | POA: Diagnosis not present

## 2018-07-12 DIAGNOSIS — Z8371 Family history of colonic polyps: Secondary | ICD-10-CM | POA: Diagnosis not present

## 2018-07-12 DIAGNOSIS — D721 Eosinophilia: Secondary | ICD-10-CM | POA: Diagnosis not present

## 2018-07-12 DIAGNOSIS — N183 Chronic kidney disease, stage 3 (moderate): Secondary | ICD-10-CM | POA: Diagnosis present

## 2018-07-12 DIAGNOSIS — I129 Hypertensive chronic kidney disease with stage 1 through stage 4 chronic kidney disease, or unspecified chronic kidney disease: Secondary | ICD-10-CM | POA: Diagnosis present

## 2018-07-12 DIAGNOSIS — Z803 Family history of malignant neoplasm of breast: Secondary | ICD-10-CM | POA: Diagnosis not present

## 2018-07-12 DIAGNOSIS — J479 Bronchiectasis, uncomplicated: Secondary | ICD-10-CM | POA: Diagnosis not present

## 2018-07-12 DIAGNOSIS — J471 Bronchiectasis with (acute) exacerbation: Secondary | ICD-10-CM | POA: Diagnosis present

## 2018-07-12 DIAGNOSIS — J441 Chronic obstructive pulmonary disease with (acute) exacerbation: Secondary | ICD-10-CM | POA: Diagnosis not present

## 2018-07-12 DIAGNOSIS — I341 Nonrheumatic mitral (valve) prolapse: Secondary | ICD-10-CM | POA: Diagnosis present

## 2018-07-12 DIAGNOSIS — F319 Bipolar disorder, unspecified: Secondary | ICD-10-CM | POA: Diagnosis present

## 2018-07-12 DIAGNOSIS — T17890A Other foreign object in other parts of respiratory tract causing asphyxiation, initial encounter: Secondary | ICD-10-CM | POA: Diagnosis present

## 2018-07-12 LAB — HEPATIC FUNCTION PANEL
ALT: 16 U/L (ref 0–44)
AST: 23 U/L (ref 15–41)
Albumin: 3.1 g/dL — ABNORMAL LOW (ref 3.5–5.0)
Alkaline Phosphatase: 134 U/L — ABNORMAL HIGH (ref 38–126)
Bilirubin, Direct: 0.1 mg/dL (ref 0.0–0.2)
Indirect Bilirubin: 0.8 mg/dL (ref 0.3–0.9)
Total Bilirubin: 0.9 mg/dL (ref 0.3–1.2)
Total Protein: 8 g/dL (ref 6.5–8.1)

## 2018-07-12 LAB — URINALYSIS, ROUTINE W REFLEX MICROSCOPIC
Bilirubin Urine: NEGATIVE
Glucose, UA: NEGATIVE mg/dL
Hgb urine dipstick: NEGATIVE
Ketones, ur: NEGATIVE mg/dL
Nitrite: NEGATIVE
Protein, ur: 100 mg/dL — AB
Specific Gravity, Urine: 1.02 (ref 1.005–1.030)
pH: 5 (ref 5.0–8.0)

## 2018-07-12 LAB — GLUCOSE, CAPILLARY
Glucose-Capillary: 82 mg/dL (ref 70–99)
Glucose-Capillary: 107 mg/dL — ABNORMAL HIGH (ref 70–99)
Glucose-Capillary: 118 mg/dL — ABNORMAL HIGH (ref 70–99)
Glucose-Capillary: 108 mg/dL — ABNORMAL HIGH (ref 70–99)
Glucose-Capillary: 119 mg/dL — ABNORMAL HIGH (ref 70–99)

## 2018-07-12 LAB — CBC
HCT: 38.9 % (ref 36.0–46.0)
Hemoglobin: 12.4 g/dL (ref 12.0–15.0)
MCH: 30.2 pg (ref 26.0–34.0)
MCHC: 31.9 g/dL (ref 30.0–36.0)
MCV: 94.9 fL (ref 78.0–100.0)
Platelets: 366 10*3/uL (ref 150–400)
RBC: 4.1 MIL/uL (ref 3.87–5.11)
RDW: 14 % (ref 11.5–15.5)
WBC: 24.7 10*3/uL — ABNORMAL HIGH (ref 4.0–10.5)

## 2018-07-12 LAB — LIPASE, BLOOD: Lipase: 31 U/L (ref 11–51)

## 2018-07-12 MED ORDER — INSULIN ASPART 100 UNIT/ML ~~LOC~~ SOLN: 0.0000 [IU] | Freq: Every day | SUBCUTANEOUS | Status: DC

## 2018-07-12 MED ORDER — FLUTICASONE PROPIONATE 50 MCG/ACT NA SUSP
1.0000 | Freq: Every day | NASAL | Status: DC
  Administered 2018-07-12 (×2): 2 via NASAL
  Administered 2018-07-13: 1 via NASAL
  Administered 2018-07-15: 2 via NASAL
  Administered 2018-07-15: 1 via NASAL
  Administered 2018-07-16: 2 via NASAL
  Administered 2018-07-17 – 2018-07-19 (×3): 1 via NASAL
  Filled 2018-07-12: qty 16

## 2018-07-12 MED ORDER — INSULIN ASPART 100 UNIT/ML ~~LOC~~ SOLN: 0.0000 [IU] | Freq: Three times a day (TID) | SUBCUTANEOUS | Status: DC

## 2018-07-12 MED ORDER — DIPHENHYDRAMINE HCL 50 MG/ML IJ SOLN
INTRAMUSCULAR | Status: AC
  Administered 2018-07-12: 25 mg via INTRAVENOUS
  Filled 2018-07-12: qty 1

## 2018-07-12 MED ORDER — METHYLPREDNISOLONE SODIUM SUCC 40 MG IJ SOLR
40.0000 mg | Freq: Two times a day (BID) | INTRAMUSCULAR | Status: DC
  Administered 2018-07-12: 40 mg via INTRAVENOUS

## 2018-07-12 MED ORDER — LOSARTAN POTASSIUM 50 MG PO TABS
50.0000 mg | ORAL_TABLET | Freq: Every day | ORAL | Status: DC
  Administered 2018-07-12 – 2018-07-20 (×8): 50 mg via ORAL
  Filled 2018-07-12 (×10): qty 1

## 2018-07-12 MED ORDER — DULOXETINE HCL 60 MG PO CPEP
60.0000 mg | ORAL_CAPSULE | Freq: Every day | ORAL | Status: DC
  Administered 2018-07-12 – 2018-07-20 (×8): 60 mg via ORAL
  Filled 2018-07-12 (×11): qty 1

## 2018-07-12 MED ORDER — ONDANSETRON HCL 4 MG PO TABS
4.0000 mg | ORAL_TABLET | Freq: Four times a day (QID) | ORAL | Status: DC | PRN
  Administered 2018-07-17 – 2018-07-19 (×3): 4 mg via ORAL
  Filled 2018-07-12 (×3): qty 1

## 2018-07-12 MED ORDER — METHYLPREDNISOLONE SODIUM SUCC 40 MG IJ SOLR
INTRAMUSCULAR | Status: AC
  Filled 2018-07-12: qty 1

## 2018-07-12 MED ORDER — SODIUM CHLORIDE 0.9 % IV SOLN
500.0000 mg | INTRAVENOUS | Status: DC
  Administered 2018-07-12 – 2018-07-14 (×3): 500 mg via INTRAVENOUS
  Filled 2018-07-12 (×4): qty 500

## 2018-07-12 MED ORDER — ATORVASTATIN CALCIUM 20 MG PO TABS
20.0000 mg | ORAL_TABLET | Freq: Every day | ORAL | Status: DC
  Administered 2018-07-12 – 2018-07-20 (×8): 20 mg via ORAL
  Filled 2018-07-12 (×10): qty 1

## 2018-07-12 MED ORDER — PANTOPRAZOLE SODIUM 40 MG PO TBEC
40.0000 mg | DELAYED_RELEASE_TABLET | Freq: Every day | ORAL | Status: DC
  Administered 2018-07-12 – 2018-07-17 (×6): 40 mg via ORAL
  Filled 2018-07-12 (×7): qty 1

## 2018-07-12 MED ORDER — SODIUM CHLORIDE 0.9 % IV SOLN
INTRAVENOUS | Status: DC
  Administered 2018-07-12: 01:00:00 via INTRAVENOUS

## 2018-07-12 MED ORDER — GUAIFENESIN ER 600 MG PO TB12
1200.0000 mg | ORAL_TABLET | Freq: Two times a day (BID) | ORAL | Status: DC
  Administered 2018-07-12 – 2018-07-20 (×16): 1200 mg via ORAL
  Filled 2018-07-12 (×18): qty 2

## 2018-07-12 MED ORDER — LAMOTRIGINE 25 MG PO TABS
25.0000 mg | ORAL_TABLET | Freq: Every day | ORAL | Status: DC
  Administered 2018-07-12 – 2018-07-20 (×8): 25 mg via ORAL
  Filled 2018-07-12 (×10): qty 1

## 2018-07-12 MED ORDER — IPRATROPIUM-ALBUTEROL 0.5-2.5 (3) MG/3ML IN SOLN: 3.0000 mL | RESPIRATORY_TRACT | Status: DC | PRN

## 2018-07-12 MED ORDER — PROMETHAZINE HCL 25 MG/ML IJ SOLN
12.5000 mg | Freq: Once | INTRAMUSCULAR | Status: AC
  Administered 2018-07-12: 12.5 mg via INTRAVENOUS
  Filled 2018-07-12: qty 1

## 2018-07-12 MED ORDER — DM-GUAIFENESIN ER 30-600 MG PO TB12
1.0000 | ORAL_TABLET | Freq: Two times a day (BID) | ORAL | Status: DC
  Administered 2018-07-12: 1 via ORAL
  Filled 2018-07-12 (×2): qty 1

## 2018-07-12 MED ORDER — INFLUENZA VAC SPLIT HIGH-DOSE 0.5 ML IM SUSY
0.5000 mL | PREFILLED_SYRINGE | INTRAMUSCULAR | Status: AC
  Administered 2018-07-14: 0.5 mL via INTRAMUSCULAR
  Filled 2018-07-12: qty 0.5

## 2018-07-12 MED ORDER — DIPHENHYDRAMINE HCL 50 MG/ML IJ SOLN
25.0000 mg | Freq: Once | INTRAMUSCULAR | Status: AC
  Administered 2018-07-12: 25 mg via INTRAVENOUS

## 2018-07-12 MED ORDER — ALBUTEROL SULFATE (2.5 MG/3ML) 0.083% IN NEBU
2.5000 mg | INHALATION_SOLUTION | RESPIRATORY_TRACT | Status: DC | PRN
  Administered 2018-07-18: 2.5 mg via RESPIRATORY_TRACT
  Filled 2018-07-12: qty 3

## 2018-07-12 MED ORDER — ENSURE ENLIVE PO LIQD: 237.0000 mL | Freq: Two times a day (BID) | ORAL | Status: DC

## 2018-07-12 MED ORDER — ENOXAPARIN SODIUM 40 MG/0.4ML ~~LOC~~ SOLN
40.0000 mg | SUBCUTANEOUS | Status: DC
  Administered 2018-07-12 – 2018-07-20 (×9): 40 mg via SUBCUTANEOUS
  Filled 2018-07-12 (×9): qty 0.4

## 2018-07-12 MED ORDER — ACETAMINOPHEN 650 MG RE SUPP: 650.0000 mg | Freq: Four times a day (QID) | RECTAL | Status: DC | PRN

## 2018-07-12 MED ORDER — MONTELUKAST SODIUM 10 MG PO TABS
10.0000 mg | ORAL_TABLET | Freq: Every day | ORAL | Status: DC
  Administered 2018-07-12 – 2018-07-19 (×9): 10 mg via ORAL
  Filled 2018-07-12 (×9): qty 1

## 2018-07-12 MED ORDER — ACETAMINOPHEN 325 MG PO TABS
650.0000 mg | ORAL_TABLET | Freq: Four times a day (QID) | ORAL | Status: DC | PRN
  Administered 2018-07-12 – 2018-07-14 (×3): 650 mg via ORAL
  Filled 2018-07-12 (×4): qty 2

## 2018-07-12 MED ORDER — IPRATROPIUM-ALBUTEROL 0.5-2.5 (3) MG/3ML IN SOLN
3.0000 mL | Freq: Four times a day (QID) | RESPIRATORY_TRACT | Status: DC
  Administered 2018-07-12 – 2018-07-14 (×10): 3 mL via RESPIRATORY_TRACT
  Filled 2018-07-12 (×10): qty 3

## 2018-07-12 MED ORDER — SODIUM CHLORIDE 0.9 % IV SOLN
1.0000 g | INTRAVENOUS | Status: DC
  Administered 2018-07-12 – 2018-07-15 (×4): 1 g via INTRAVENOUS
  Filled 2018-07-12 (×4): qty 10

## 2018-07-12 MED ORDER — LINACLOTIDE 145 MCG PO CAPS
145.0000 ug | ORAL_CAPSULE | Freq: Every day | ORAL | Status: DC
  Administered 2018-07-12 – 2018-07-17 (×5): 145 ug via ORAL
  Filled 2018-07-12 (×6): qty 1

## 2018-07-12 MED ORDER — ONDANSETRON HCL 4 MG/2ML IJ SOLN
4.0000 mg | Freq: Four times a day (QID) | INTRAMUSCULAR | Status: DC | PRN
  Administered 2018-07-12 – 2018-07-16 (×5): 4 mg via INTRAVENOUS
  Filled 2018-07-12 (×7): qty 2

## 2018-07-12 NOTE — Progress Notes (Signed)
Initial Nutrition Assessment  DOCUMENTATION CODES:   Not applicable  INTERVENTION:  Monitor PO intake and weight trends   NUTRITION DIAGNOSIS:   Inadequate oral intake related to decreased appetite, nausea, vomiting(COPD exacerbation) as evidenced by per patient/family report, meal completion < 50%.   GOAL:   Patient will meet greater than or equal to 90% of their needs  MONITOR:   PO intake, Weight trends  REASON FOR ASSESSMENT:   Malnutrition Screening Tool    ASSESSMENT:   Pt admitted with pneumonia, COPD exacerbation, and experienced N/V for the past week   Woke pt at visit and pt reports feeling much better. Pt states improvement to nausea since admission, but did have vomiting after taking Prednisone last night. Pt reports loss of appetite lasting approximately 2 months and consuming less than 75% of meals. Pt states that she experienced nausea mostly after taking showers. Pt states that N/V comes and goes and is not an everyday occurrence. Pt reports weighing 168lbs 58months ago and has been trending down since without trying. Report is inconsistent from wt history in chart showing little fluctuation in wt since 1/19   At home pt states that boyfriend does most of the cooking and recalled having pork chops and mashed pots last week. Pt states that she will typically have [place] for Brunch consisting of either a sausage or bacon biscuit, grits and coffee. Before bed pt reports having a pepsi and small bag of chips, usually Cheetoes.   Pt did not like the breakfast that was sent to her room this morning and reports eating only a few bites. Pt was able to choice her lunch choice and looking forward to a tuna sandwich. Informed pt to ask for a menu when lunch tray was delivered so she could be aware of all options available. Offered ONS supplement if PO intake was poor, pt declined and stated not caring for any of the flavors.  PMH: T2DM, CKD3, Esophageal candidasis,  GERD, HLD, HTN,   Medications reviewed and include: novoLOG 0-5units at bedtime, novoLOG 0-9units with meals, protonix, zofran  Labs: Glucose 77 3/19 A1c: 5.9  CT scan: acute bronchopneumonia, bronchiectasis  NUTRITION - FOCUSED PHYSICAL EXAM:    Most Recent Value  Orbital Region  No depletion  Upper Arm Region  No depletion  Thoracic and Lumbar Region  No depletion  Buccal Region  No depletion  Temple Region  Mild depletion  Clavicle Bone Region  No depletion  Clavicle and Acromion Bone Region  No depletion  Scapular Bone Region  No depletion  Dorsal Hand  No depletion  Patellar Region  No depletion  Anterior Thigh Region  No depletion  Posterior Calf Region  No depletion  Edema (RD Assessment)  None  Hair  Reviewed  Eyes  Reviewed  Mouth  Reviewed  Skin  Reviewed  Nails  Reviewed       Diet Order:   Diet Order            Diet heart healthy/carb modified Room service appropriate? Yes; Fluid consistency: Thin  Diet effective now              EDUCATION NEEDS:   No education needs have been identified at this time  Skin:  Skin Assessment: Reviewed RN Assessment  Last BM:  9/20 type 5; soft blobs w/ clear cut edges(per RN note)  Height:   Ht Readings from Last 1 Encounters:  07/12/18 5' 2.01" (1.575 m)    Weight: 149lbs  Wt Readings  from Last 1 Encounters:  07/12/18 67.7 kg    Ideal Body Weight:  50 kg (110lbs)  BMI:  Body mass index is 27.29 kg/m.  Estimated Nutritional Needs:   Kcal:  1500-1700 (22-25kcal/kg)  Protein:  80-95grams (1.2-1.4kcal/kg)  Fluid:  1.8L    Lars Masson, RD, LDN  After Hours/Weekend Pager: [phone]

## 2018-07-12 NOTE — Progress Notes (Signed)
PROGRESS NOTE        PATIENT DETAILS Name: Desiree [name] Age: 67 y.o. Sex: female Date of Birth: September 16, 1951 Admit Date: 07/11/2018 Admitting Physician Ejiroghene Wendall Stade, MD [id number], Neta Mends, MD  Brief Narrative: Patient is a 67 y.o. female with prior history of bronchiectasis, COPD, DM, hypertension resenting with worsening cough and increased sputum production-was gradually progressed to worsening shortness of breath over the past week or so, she presented to the emergency room where she was thought to have pneumonia along with COPD exacerbation.  She was subsequently admitted to the hospitalist service.  See below for further details.  Subjective: Feels better than on admission-continues to have more productive phlegm than usual baseline-shortness of breath has improved but is still wheezing.  Assessment/Plan: Community-acquired pneumonia: Overall improved-less shortness of breath-continues to have leukocytosis but is downtrending-although improved she has not yet back to her baseline.  Plans are to continue Rocephin and Zithromax-and follow clinical course.  Blood cultures are negative so far.  COPD/bronchiectasis exacerbation: Per nursing staff-she had a allergic reaction to Solu-Medrol (increased heart rate, shortness of breath, diarrhea last night)-off all steroids-although wheezing she is moving air well-we will encourage incentive spirometry/flutter valve and chest PT.  Continue scheduled Mucinex.  Stop IV fluids and ambulate/out of bed to chair as well.  Monitor off steroids at this time.  Hypertension: Controlled-continue losartan  CKD stage III: Close to usual baseline-follow.  DM-2: CBG stable-continue SSI-does not appear to be on any oral hypoglycemic agents-suspect diet/exercise controlled at home.  Dyslipidemia: Continue statin  GERD: Continue PPI  Bipolar disorder/anxiety: Stable-continue Cymbalta, Lamictal  DVT  Prophylaxis: Prophylactic Lovenox   Code Status: Full code   Family Communication: None at bedside  Disposition Plan: Remain inpatient-will require several days of hospitalization before she is ready for discharge.  Antimicrobial agents: Anti-infectives (From admission, onward)   Start     Dose/Rate Route Frequency Ordered Stop   07/13/18 2000  levofloxacin (LEVAQUIN) tablet 750 mg  Status:  Discontinued     750 mg Oral Every 48 hours 07/11/18 2240 07/12/18 0726   07/12/18 1600  azithromycin (ZITHROMAX) 500 mg in sodium chloride 0.9 % 250 mL IVPB     500 mg 250 mL/hr over 60 Minutes Intravenous Every 24 hours 07/12/18 0056     07/12/18 0100  cefTRIAXone (ROCEPHIN) 1 g in sodium chloride 0.9 % 100 mL IVPB     1 g 200 mL/hr over 30 Minutes Intravenous Every 24 hours 07/12/18 0056     07/11/18 2145  levofloxacin (LEVAQUIN) IVPB 750 mg     750 mg 100 mL/hr over 90 Minutes Intravenous  Once 07/11/18 2132 07/12/18 0015      Procedures: None  CONSULTS:  None  Time spent: 25- minutes-Greater than 50% of this time was spent in counseling, explanation of diagnosis, planning of further management, and coordination of care.  MEDICATIONS: Scheduled Meds: . atorvastatin  20 mg Oral Daily  . DULoxetine  60 mg Oral Daily  . enoxaparin (LOVENOX) injection  40 mg Subcutaneous Q24H  . feeding supplement (ENSURE ENLIVE)  237 mL Oral BID BM  . fluticasone  1-2 spray Each Nare QHS  . guaiFENesin  1,200 mg Oral BID  . [START ON 07/13/2018] Influenza vac split quadrivalent PF  0.5 mL Intramuscular Tomorrow-1000  . insulin aspart  0-5 Units Subcutaneous QHS  .  insulin aspart  0-9 Units Subcutaneous TID WC  . ipratropium-albuterol  3 mL Nebulization Q6H  . lamoTRIgine  25 mg Oral Daily  . linaclotide  145 mcg Oral QAC breakfast  . losartan  50 mg Oral Daily  . montelukast  10 mg Oral QHS  . pantoprazole  40 mg Oral Daily   Continuous Infusions: . azithromycin    . cefTRIAXone  (ROCEPHIN)  IV Stopped (07/12/18 0200)   PRN Meds:.acetaminophen **OR** acetaminophen, albuterol, ondansetron **OR** ondansetron (ZOFRAN) IV   PHYSICAL EXAM: Vital signs: Vitals:   07/12/18 0215 07/12/18 0400 07/12/18 0732 07/12/18 0739  BP:    122/76  Pulse:    83  Resp: (!) 26 18  14   Temp:    97.6 F (36.4 C)  TempSrc:    Oral  SpO2:   96% 93%  Weight:      Height:       Filed Weights   07/11/18 2217 07/12/18 0040  Weight: 67.6 kg 67.7 kg   Body mass index is 27.3 kg/m.   General appearance :Awake, alert, not in any distress. Speech Clear.  Eyes:Pink conjunctiva HEENT: Atraumatic and Normocephalic Neck: supple Resp:Good air entry bilaterally, coarse rhonchi CVS: S1 S2 regular, no murmurs.  GI: Bowel sounds present, Non tender and not distended with no gaurding, rigidity or rebound.No organomegaly Extremities: B/L Lower Ext shows no edema, both legs are warm to touch Neurology:  speech clear,Non focal, sensation is grossly intact. Psychiatric: Normal judgment and insight. Alert and oriented x 3. Normal mood. Musculoskeletal:No digital cyanosis Skin:No Rash, warm and dry Wounds:N/A  I have personally reviewed following labs and imaging studies  LABORATORY DATA: CBC: Recent Labs  Lab 07/11/18 1732 07/12/18 0255  WBC 27.6* 24.7*  HGB 12.7 12.4  HCT 40.1 38.9  MCV 95.9 94.9  PLT 391 366    Basic Metabolic Panel: Recent Labs  Lab 07/11/18 1732  NA 140  K 4.6  CL 101  CO2 25  GLUCOSE 77  BUN 12  CREATININE 1.32*  CALCIUM 9.3    GFR: Estimated Creatinine Clearance: 37.8 mL/min (A) (by C-G formula based on SCr of 1.32 mg/dL (H)).  Liver Function Tests: Recent Labs  Lab 07/11/18 2219  AST 23  ALT 16  ALKPHOS 134*  BILITOT 0.9  PROT 8.0  ALBUMIN 3.1*   Recent Labs  Lab 07/11/18 2219  LIPASE 31   No results for input(s): AMMONIA in the last 168 hours.  Coagulation Profile: No results for input(s): INR, PROTIME in the last 168  hours.  Cardiac Enzymes: No results for input(s): CKTOTAL, CKMB, CKMBINDEX, TROPONINI in the last 168 hours.  BNP (last 3 results) No results for input(s): PROBNP in the last 8760 hours.  HbA1C: No results for input(s): HGBA1C in the last 72 hours.  CBG: No results for input(s): GLUCAP in the last 168 hours.  Lipid Profile: No results for input(s): CHOL, HDL, LDLCALC, TRIG, CHOLHDL, LDLDIRECT in the last 72 hours.  Thyroid Function Tests: No results for input(s): TSH, T4TOTAL, FREET4, T3FREE, THYROIDAB in the last 72 hours.  Anemia Panel: No results for input(s): VITAMINB12, FOLATE, FERRITIN, TIBC, IRON, RETICCTPCT in the last 72 hours.  Urine analysis:    Component Value Date/Time   COLORURINE YELLOW 07/11/2018 0046   APPEARANCEUR HAZY (A) 07/11/2018 0046   LABSPEC 1.020 07/11/2018 0046   PHURINE 5.0 07/11/2018 0046   GLUCOSEU NEGATIVE 07/11/2018 0046   HGBUR NEGATIVE 07/11/2018 0046   HGBUR negative 03/11/2008 1317  BILIRUBINUR NEGATIVE 07/11/2018 0046   BILIRUBINUR n 08/07/2017 1558   KETONESUR NEGATIVE 07/11/2018 0046   PROTEINUR 100 (A) 07/11/2018 0046   UROBILINOGEN 1.0 08/07/2017 1558   UROBILINOGEN 0.2 08/16/2014 1513   NITRITE NEGATIVE 07/11/2018 0046   LEUKOCYTESUR MODERATE (A) 07/11/2018 0046    Sepsis Labs: Lactic Acid, Venous    Component Value Date/Time   LATICACIDVEN 1.11 07/11/2018 2236    MICROBIOLOGY: Recent Results (from the past 240 hour(s))  Blood Culture (routine x 2)     Status: None (Preliminary result)   Collection Time: 07/11/18 10:19 PM  Result Value Ref Range Status   Specimen Description BLOOD RIGHT FOREARM  Final   Special Requests   Final    BOTTLES DRAWN AEROBIC AND ANAEROBIC Blood Culture adequate volume   Culture   Final    NO GROWTH < 12 HOURS Performed at [facility], [address] [place], [place] [address]    Report Status PENDING  Incomplete  Blood Culture (routine x 2)     Status: None (Preliminary  result)   Collection Time: 07/11/18 10:19 PM  Result Value Ref Range Status   Specimen Description BLOOD LEFT ANTECUBITAL  Final   Special Requests   Final    BOTTLES DRAWN AEROBIC AND ANAEROBIC Blood Culture results may not be optimal due to an inadequate volume of blood received in culture bottles   Culture   Final    NO GROWTH < 12 HOURS Performed at [facility], [address], [place], [place] [address]    Report Status PENDING  Incomplete    RADIOLOGY STUDIES/RESULTS: Dg Chest 2 View  Result Date: 07/11/2018 CLINICAL DATA:  Nausea vomiting and headache x1 week EXAM: CHEST - 2 VIEW COMPARISON:  08/22/2017 FINDINGS: Heart size is normal. There is mild aortic atherosclerosis without aneurysm. No acute pulmonary consolidation. Mild chronic interstitial prominence and bibasilar atelectasis is identified. No overt pulmonary edema. ACDF of the lower cervical spine. No acute osseous abnormality. IMPRESSION: 1. Chronic mild interstitial lung disease and bibasilar atelectasis. 2. Minimal aortic atherosclerosis. 3. No acute pulmonary disease Electronically Signed   By: Tollie Eth M.D.   On: 07/11/2018 18:08   Ct Chest Wo Contrast  Result Date: 07/11/2018 CLINICAL DATA:  Nausea and vomiting for 1 week. Recurrent chest pain for 6 months. Shortness of breath and dizziness with exertion. Productive cough. EXAM: CT CHEST WITHOUT CONTRAST TECHNIQUE: Multidetector CT imaging of the chest was performed following the standard protocol without IV contrast. COMPARISON:  High-resolution chest CT 09/26/2017 FINDINGS: Cardiovascular: Normal heart size. Minimal pericardial effusion. Normal caliber thoracic aorta with scattered calcifications. Mediastinum/Nodes: Mild prominence of mediastinal lymph nodes with pretracheal nodes measuring up to about 10 mm short axis dimension. Similar appearance to previous study. Likely to be reactive. Esophagus is decompressed. Thyroid gland is unremarkable. Lungs/Pleura:  Scattered areas of interstitial fibrosis in the lungs. Bronchial wall thickening suggesting chronic bronchitis. Bronchiectasis with mucous plugging in the lung bases. Patchy focal areas of consolidation in the lung bases, more prominent on the right. Consolidative changes are new since previous study suggesting interval development of an acute bronchopneumonia. No pleural effusions. No pneumothorax. Upper Abdomen: No acute changes identified. Vascular calcifications. Musculoskeletal: Degenerative changes in the thoracic spine. No destructive bone lesions. IMPRESSION: 1. Patchy areas of consolidation in the lung bases, more prominent on the right, suggesting interval development of acute bronchopneumonia. 2. Chronic bronchitic changes with bronchiectasis and mucous plugging in the lung bases. 3. Mild mediastinal  lymphadenopathy, likely reactive. 4. Minimal pericardial effusion. Aortic Atherosclerosis (ICD10-I70.0). Electronically Signed   By: Burman Nieves M.D.   On: 07/11/2018 23:55     LOS: 0 days   Jeoffrey Massed, MD  [facility]  If 7PM-7AM, please contact night-coverage  Please page via [email or web address]-Password [email or web address]-click on MD name and type text message  07/12/2018, 10:27 AM

## 2018-07-12 NOTE — Progress Notes (Signed)
Patient given 40mg  IV solumedrol at 01:20, documented allergy in computer stated reaction was itching, at 01:32 patient sat up suddenly on side of bed, c/o burning in chest, increased difficulty breathing, HR tachycardic 140s, nauseous, pt stated this is what happened when she took prednisone before, NP Bodenheimer paged, IV Benadryl given, IV Zofran given, pt now still c/o burning/pain in chest, with nausea but states it is getting a little better.

## 2018-07-13 LAB — URINE CULTURE: Culture: <10000 — AB

## 2018-07-13 LAB — GLUCOSE, CAPILLARY: Glucose-Capillary: 94 mg/dL (ref 70–99)

## 2018-07-13 LAB — CBC
HCT: 32 % — ABNORMAL LOW (ref 36.0–46.0)
Hemoglobin: 10.3 g/dL — ABNORMAL LOW (ref 12.0–15.0)
MCH: 30.4 pg (ref 26.0–34.0)
MCHC: 32.2 g/dL (ref 30.0–36.0)
MCV: 94.4 fL (ref 78.0–100.0)
Platelets: 360 10*3/uL (ref 150–400)
RBC: 3.39 MIL/uL — ABNORMAL LOW (ref 3.87–5.11)
RDW: 14.3 % (ref 11.5–15.5)
WBC: 25.4 10*3/uL — ABNORMAL HIGH (ref 4.0–10.5)

## 2018-07-13 NOTE — Progress Notes (Signed)
PROGRESS NOTE        PATIENT DETAILS Name: Desiree [name] Age: 67 y.o. Sex: female Date of Birth: [birth year] Admit Date: 07/11/2018 Admitting Physician Ejiroghene Wendall Stade, MD [id number], Neta Mends, MD  Brief Narrative: Patient is a 67 y.o. female with prior history of bronchiectasis, COPD, DM, hypertension resenting with worsening cough and increased sputum production-was gradually progressed to worsening shortness of breath over the past week or so, she presented to the emergency room where she was thought to have pneumonia along with COPD exacerbation.  She was subsequently admitted to the hospitalist service.  See below for further details.  Subjective: Feels better-still wheezing but less shortness of breath.  Assessment/Plan: Community-acquired pneumonia: Clinically improved-shortness of breath has markedly improved-however she is still not back to usual baseline.  CBC pending-cultures negative so far.  Continue Rocephin and Zithromax.    COPD/bronchiectasis exacerbation: Slowly improving-moving air well-still with rhonchi bilaterally but is much better than yesterday-Per patient she has started to ambulate around the room-she is better but not yet back to her baseline.  She had an allergic reaction to IV steroids.  Plans are to continue with bronchodilators, Mucinex, aggressive chest PT/flutter valve etc.  Will reassess tomorrow.  Hypertension: Controlled-continue losartan.  CKD stage III: Close to usual baseline.  Follow  DM-2: CBG stable-continue SSI.  Dyslipidemia: Continue statin  GERD: Continue PPI  Bipolar disorder/anxiety: Stable-continue Cymbalta, Lamictal  DVT Prophylaxis: Prophylactic Lovenox   Code Status: Full code   Family Communication: None at bedside  Disposition Plan: Remain inpatient-will require 1-2 more days of inpatient stay before consideration of discharge.  Antimicrobial agents: Anti-infectives (From admission,  onward)   Start     Dose/Rate Route Frequency Ordered Stop   07/13/18 2000  levofloxacin (LEVAQUIN) tablet 750 mg  Status:  Discontinued     750 mg Oral Every 48 hours 07/11/18 2240 07/12/18 0726   07/12/18 1600  azithromycin (ZITHROMAX) 500 mg in sodium chloride 0.9 % 250 mL IVPB     500 mg 250 mL/hr over 60 Minutes Intravenous Every 24 hours 07/12/18 0056     07/12/18 0100  cefTRIAXone (ROCEPHIN) 1 g in sodium chloride 0.9 % 100 mL IVPB     1 g 200 mL/hr over 30 Minutes Intravenous Every 24 hours 07/12/18 0056     07/11/18 2145  levofloxacin (LEVAQUIN) IVPB 750 mg     750 mg 100 mL/hr over 90 Minutes Intravenous  Once 07/11/18 2132 07/12/18 0015      Procedures: None  CONSULTS:  None  Time spent: 25- minutes-Greater than 50% of this time was spent in counseling, explanation of diagnosis, planning of further management, and coordination of care.  MEDICATIONS: Scheduled Meds: . atorvastatin  20 mg Oral Daily  . DULoxetine  60 mg Oral Daily  . enoxaparin (LOVENOX) injection  40 mg Subcutaneous Q24H  . fluticasone  1-2 spray Each Nare QHS  . guaiFENesin  1,200 mg Oral BID  . Influenza vac split quadrivalent PF  0.5 mL Intramuscular Tomorrow-1000  . insulin aspart  0-5 Units Subcutaneous QHS  . insulin aspart  0-9 Units Subcutaneous TID WC  . ipratropium-albuterol  3 mL Nebulization Q6H  . lamoTRIgine  25 mg Oral Daily  . linaclotide  145 mcg Oral QAC breakfast  . losartan  50 mg Oral Daily  . montelukast  10 mg Oral  QHS  . pantoprazole  40 mg Oral Daily   Continuous Infusions: . azithromycin Stopped (07/12/18 1730)  . cefTRIAXone (ROCEPHIN)  IV Stopped (07/13/18 0105)   PRN Meds:.acetaminophen **OR** acetaminophen, albuterol, ondansetron **OR** ondansetron (ZOFRAN) IV   PHYSICAL EXAM: Vital signs: Vitals:   07/12/18 1547 07/12/18 2332 07/13/18 0747 07/13/18 0758  BP: (!) 112/58 115/66  135/84  Pulse: 91 92  (!) 102  Resp: 15 20  20   Temp: 98.1 F (36.7 C) 98.1  F (36.7 C)  98.2 F (36.8 C)  TempSrc: Oral Oral  Oral  SpO2: 100% 98% 99% 93%  Weight:      Height:       Filed Weights   07/11/18 2217 07/12/18 0040 07/12/18 0739  Weight: 67.6 kg 67.7 kg 67.7 kg   Body mass index is 27.29 kg/m.   General appearance:Awake, alert, not in any distress.  Eyes:no scleral icterus. HEENT: Atraumatic and Normocephalic Neck: supple, no JVD. Resp:Good air entry bilaterally, coarse rhonchi all over CVS: S1 S2 regular, no murmurs.  GI: Bowel sounds present, Non tender and not distended with no gaurding, rigidity or rebound. Extremities: B/L Lower Ext shows no edema, both legs are warm to touch Neurology:  Non focal Psychiatric: Normal judgment and insight. Normal mood. Musculoskeletal:No digital cyanosis Skin:No Rash, warm and dry Wounds:N/A  I have personally reviewed following labs and imaging studies  LABORATORY DATA: CBC: Recent Labs  Lab 07/11/18 1732 07/12/18 0255  WBC 27.6* 24.7*  HGB 12.7 12.4  HCT 40.1 38.9  MCV 95.9 94.9  PLT 391 366    Basic Metabolic Panel: Recent Labs  Lab 07/11/18 1732  NA 140  K 4.6  CL 101  CO2 25  GLUCOSE 77  BUN 12  CREATININE 1.32*  CALCIUM 9.3    GFR: Estimated Creatinine Clearance: 37.8 mL/min (A) (by C-G formula based on SCr of 1.32 mg/dL (H)).  Liver Function Tests: Recent Labs  Lab 07/11/18 2219  AST 23  ALT 16  ALKPHOS 134*  BILITOT 0.9  PROT 8.0  ALBUMIN 3.1*   Recent Labs  Lab 07/11/18 2219  LIPASE 31   No results for input(s): AMMONIA in the last 168 hours.  Coagulation Profile: No results for input(s): INR, PROTIME in the last 168 hours.  Cardiac Enzymes: No results for input(s): CKTOTAL, CKMB, CKMBINDEX, TROPONINI in the last 168 hours.  BNP (last 3 results) No results for input(s): PROBNP in the last 8760 hours.  HbA1C: No results for input(s): HGBA1C in the last 72 hours.  CBG: Recent Labs  Lab 07/12/18 0741 07/12/18 1137 07/12/18 1625  07/12/18 2124 07/13/18 0753  GLUCAP 107* 118* 108* 119* 94    Lipid Profile: No results for input(s): CHOL, HDL, LDLCALC, TRIG, CHOLHDL, LDLDIRECT in the last 72 hours.  Thyroid Function Tests: No results for input(s): TSH, T4TOTAL, FREET4, T3FREE, THYROIDAB in the last 72 hours.  Anemia Panel: No results for input(s): VITAMINB12, FOLATE, FERRITIN, TIBC, IRON, RETICCTPCT in the last 72 hours.  Urine analysis:    Component Value Date/Time   COLORURINE YELLOW 07/11/2018 0046   APPEARANCEUR HAZY (A) 07/11/2018 0046   LABSPEC 1.020 07/11/2018 0046   PHURINE 5.0 07/11/2018 0046   GLUCOSEU NEGATIVE 07/11/2018 0046   HGBUR NEGATIVE 07/11/2018 0046   HGBUR negative 03/11/2008 1317   BILIRUBINUR NEGATIVE 07/11/2018 0046   BILIRUBINUR n 08/07/2017 1558   KETONESUR NEGATIVE 07/11/2018 0046   PROTEINUR 100 (A) 07/11/2018 0046   UROBILINOGEN 1.0 08/07/2017 1558   UROBILINOGEN  0.2 08/16/2014 1513   NITRITE NEGATIVE 07/11/2018 0046   LEUKOCYTESUR MODERATE (A) 07/11/2018 0046    Sepsis Labs: Lactic Acid, Venous    Component Value Date/Time   LATICACIDVEN 1.11 07/11/2018 2236    MICROBIOLOGY: Recent Results (from the past 240 hour(s))  Urine culture     Status: Abnormal   Collection Time: 07/11/18  9:32 PM  Result Value Ref Range Status   Specimen Description URINE, RANDOM  Final   Special Requests NONE  Final   Culture (A)  Final    <10,000 COLONIES/mL INSIGNIFICANT GROWTH Performed at [facility], [address] [place], [place] [address]    Report Status 07/13/2018 FINAL  Final  Blood Culture (routine x 2)     Status: None (Preliminary result)   Collection Time: 07/11/18 10:19 PM  Result Value Ref Range Status   Specimen Description BLOOD RIGHT FOREARM  Final   Special Requests   Final    BOTTLES DRAWN AEROBIC AND ANAEROBIC Blood Culture adequate volume   Culture   Final    NO GROWTH 2 DAYS Performed at [facility], [address], [place], [place]  [address]    Report Status PENDING  Incomplete  Blood Culture (routine x 2)     Status: None (Preliminary result)   Collection Time: 07/11/18 10:19 PM  Result Value Ref Range Status   Specimen Description BLOOD LEFT ANTECUBITAL  Final   Special Requests   Final    BOTTLES DRAWN AEROBIC AND ANAEROBIC Blood Culture results may not be optimal due to an inadequate volume of blood received in culture bottles   Culture   Final    NO GROWTH 2 DAYS Performed at [facility], [address], [place], [place] [address]    Report Status PENDING  Incomplete    RADIOLOGY STUDIES/RESULTS: Dg Chest 2 View  Result Date: 07/11/2018 CLINICAL DATA:  Nausea vomiting and headache x1 week EXAM: CHEST - 2 VIEW COMPARISON:  08/22/2017 FINDINGS: Heart size is normal. There is mild aortic atherosclerosis without aneurysm. No acute pulmonary consolidation. Mild chronic interstitial prominence and bibasilar atelectasis is identified. No overt pulmonary edema. ACDF of the lower cervical spine. No acute osseous abnormality. IMPRESSION: 1. Chronic mild interstitial lung disease and bibasilar atelectasis. 2. Minimal aortic atherosclerosis. 3. No acute pulmonary disease Electronically Signed   By: Tollie Eth M.D.   On: 07/11/2018 18:08   Ct Chest Wo Contrast  Result Date: 07/11/2018 CLINICAL DATA:  Nausea and vomiting for 1 week. Recurrent chest pain for 6 months. Shortness of breath and dizziness with exertion. Productive cough. EXAM: CT CHEST WITHOUT CONTRAST TECHNIQUE: Multidetector CT imaging of the chest was performed following the standard protocol without IV contrast. COMPARISON:  High-resolution chest CT 09/26/2017 FINDINGS: Cardiovascular: Normal heart size. Minimal pericardial effusion. Normal caliber thoracic aorta with scattered calcifications. Mediastinum/Nodes: Mild prominence of mediastinal lymph nodes with pretracheal nodes measuring up to about 10 mm short axis dimension. Similar appearance to previous  study. Likely to be reactive. Esophagus is decompressed. Thyroid gland is unremarkable. Lungs/Pleura: Scattered areas of interstitial fibrosis in the lungs. Bronchial wall thickening suggesting chronic bronchitis. Bronchiectasis with mucous plugging in the lung bases. Patchy focal areas of consolidation in the lung bases, more prominent on the right. Consolidative changes are new since previous study suggesting interval development of an acute bronchopneumonia. No pleural effusions. No pneumothorax. Upper Abdomen: No acute changes identified. Vascular calcifications. Musculoskeletal: Degenerative changes in the thoracic spine. No destructive  bone lesions. IMPRESSION: 1. Patchy areas of consolidation in the lung bases, more prominent on the right, suggesting interval development of acute bronchopneumonia. 2. Chronic bronchitic changes with bronchiectasis and mucous plugging in the lung bases. 3. Mild mediastinal lymphadenopathy, likely reactive. 4. Minimal pericardial effusion. Aortic Atherosclerosis (ICD10-I70.0). Electronically Signed   By: Burman Nieves M.D.   On: 07/11/2018 23:55     LOS: 1 day   Jeoffrey Massed, MD  [facility]  If 7PM-7AM, please contact night-coverage  Please page via [email or web address]-Password [email or web address]-click on MD name and type text message  07/13/2018, 10:53 AM

## 2018-07-14 ENCOUNTER — Inpatient Hospital Stay (HOSPITAL_COMMUNITY): Payer: Medicare HMO

## 2018-07-14 LAB — BASIC METABOLIC PANEL
Anion gap: 11 (ref 5–15)
BUN: 11 mg/dL (ref 8–23)
CO2: 23 mmol/L (ref 22–32)
Calcium: 8.4 mg/dL — ABNORMAL LOW (ref 8.9–10.3)
Chloride: 105 mmol/L (ref 98–111)
Creatinine, Ser: 1.33 mg/dL — ABNORMAL HIGH (ref 0.44–1.00)
GFR calc Af Amer: 47 mL/min — ABNORMAL LOW (ref >60)
GFR calc non Af Amer: 41 mL/min — ABNORMAL LOW (ref >60)
Glucose, Bld: 124 mg/dL — ABNORMAL HIGH (ref 70–99)
Potassium: 4 mmol/L (ref 3.5–5.1)
Sodium: 139 mmol/L (ref 135–145)

## 2018-07-14 LAB — CBC
HCT: 34.2 % — ABNORMAL LOW (ref 36.0–46.0)
Hemoglobin: 11 g/dL — ABNORMAL LOW (ref 12.0–15.0)
MCH: 30.1 pg (ref 26.0–34.0)
MCHC: 32.2 g/dL (ref 30.0–36.0)
MCV: 93.7 fL (ref 78.0–100.0)
Platelets: 368 10*3/uL (ref 150–400)
RBC: 3.65 MIL/uL — ABNORMAL LOW (ref 3.87–5.11)
RDW: 14.1 % (ref 11.5–15.5)
WBC: 29.5 10*3/uL — ABNORMAL HIGH (ref 4.0–10.5)

## 2018-07-14 MED ORDER — ACETYLCYSTEINE 20 % IN SOLN
4.0000 mL | Freq: Three times a day (TID) | RESPIRATORY_TRACT | Status: DC
  Administered 2018-07-14 – 2018-07-17 (×10): 4 mL via RESPIRATORY_TRACT
  Filled 2018-07-14 (×13): qty 4

## 2018-07-14 MED ORDER — DIPHENHYDRAMINE HCL 50 MG/ML IJ SOLN: 50.0000 mg | Freq: Once | INTRAMUSCULAR | Status: DC | PRN

## 2018-07-14 MED ORDER — HYDROCORTISONE NA SUCCINATE PF 100 MG IJ SOLR: 50.0000 mg | Freq: Once | INTRAMUSCULAR | Status: DC

## 2018-07-14 MED ORDER — IPRATROPIUM-ALBUTEROL 0.5-2.5 (3) MG/3ML IN SOLN
3.0000 mL | Freq: Three times a day (TID) | RESPIRATORY_TRACT | Status: DC
  Administered 2018-07-14 – 2018-07-17 (×9): 3 mL via RESPIRATORY_TRACT
  Filled 2018-07-14 (×9): qty 3

## 2018-07-14 MED ORDER — DIPHENHYDRAMINE HCL 50 MG/ML IJ SOLN: 50.0000 mg | Freq: Once | INTRAMUSCULAR | Status: DC

## 2018-07-14 NOTE — Plan of Care (Signed)
  Problem: Clinical Measurements: Goal: Ability to maintain clinical measurements within normal limits will improve Outcome: Progressing   Problem: Clinical Measurements: Goal: Respiratory complications will improve Outcome: Progressing   Problem: Clinical Measurements: Goal: Cardiovascular complication will be avoided Outcome: Progressing   

## 2018-07-14 NOTE — Progress Notes (Signed)
PROGRESS NOTE        PATIENT DETAILS Name: Desiree [name] Age: 67 y.o. Sex: female Date of Birth: [birth year] Admit Date: 07/11/2018 Admitting Physician Ejiroghene Wendall Stade, MD HER:DEYCXK, Desiree Mends, MD  Brief Narrative: Patient is a 67 y.o. female with prior history of bronchiectasis, COPD, DM, hypertension resenting with worsening cough and increased sputum production-was gradually progressed to worsening shortness of breath over the past week or so, she presented to the emergency room where she was thought to have pneumonia along with COPD exacerbation.  She was subsequently admitted to the hospitalist service.  See below for further details.  Subjective: Still wheezing-says he feels essentially the same as yesterday.  Afebrile.  Assessment/Plan: Community-acquired pneumonia: Shortness of breath has somewhat improved-she is still not back to her baseline.  She continues to have significant leukocytosis (but did receive 1 dose of steroids) but is afebrile.  Blood cultures negative so far-chest x-ray shows worsening right-sided infiltrate-for now I would continue with Rocephin and Zithromax-and reassess tomorrow-if her symptoms are not better-and leukocytosis continues to worsen-we may need to consider broadening antimicrobial spectrum.  Send out sputum cultures as well.    COPD/bronchiectasis exacerbation: Somewhat improved but the improvement seems to have plateaued compared to yesterday-still very rhonchorous-but moving air well.  Continue bronchodilators, Mucinex, aggressive chest PT-add Mucomyst nebs today.  She developed a allergic reaction to Solu-Medrol post admission and systemic steroids were discontinued.  Spoke with PCCM on-call-advised that we potentially could try Solu-Cortef as she has had a potential allergic reaction to IV Solu-Medrol.  Advised we continue with supportive care and current antimicrobial therapy for now.  Spoke with pharmacy-we will try a  test dose of Solu-Cortef-and use Benadryl if she has a reaction.  ?  Hypertrophic cardiomyopathy: Echocardiogram in 2018 showed septal asymmetry-not sure why not on beta-blocker but due to significant amount of rhonchi-we will hold off on starting beta-blocker for now.  May be when she is close to discharge we can consider adding on a beta-blocker.  Hypertension: BP controlled-continue losartan.   CKD stage III: Creatinine close to usual baseline.  Follow.  Dyslipidemia: Continue statin  GERD: Continue PPI  Bipolar disorder/anxiety: Stable-continue Cymbalta, Lamictal  DVT Prophylaxis: Prophylactic Lovenox   Code Status: Full code   Family Communication: None at bedside  Disposition Plan: Remain inpatient-will require several more days of hospitalization before consideration of discharge.  Antimicrobial agents: Anti-infectives (From admission, onward)   Start     Dose/Rate Route Frequency Ordered Stop   07/13/18 2000  levofloxacin (LEVAQUIN) tablet 750 mg  Status:  Discontinued     750 mg Oral Every 48 hours 07/11/18 2240 07/12/18 0726   07/12/18 1600  azithromycin (ZITHROMAX) 500 mg in sodium chloride 0.9 % 250 mL IVPB     500 mg 250 mL/hr over 60 Minutes Intravenous Every 24 hours 07/12/18 0056     07/12/18 0100  cefTRIAXone (ROCEPHIN) 1 g in sodium chloride 0.9 % 100 mL IVPB     1 g 200 mL/hr over 30 Minutes Intravenous Every 24 hours 07/12/18 0056     07/11/18 2145  levofloxacin (LEVAQUIN) IVPB 750 mg     750 mg 100 mL/hr over 90 Minutes Intravenous  Once 07/11/18 2132 07/12/18 0015      Procedures: None  CONSULTS:  None  Time spent: 25- minutes-Greater than 50% of this  time was spent in counseling, explanation of diagnosis, planning of further management, and coordination of care.  MEDICATIONS: Scheduled Meds: . acetylcysteine  4 mL Nebulization TID  . atorvastatin  20 mg Oral Daily  . DULoxetine  60 mg Oral Daily  . enoxaparin (LOVENOX) injection  40 mg  Subcutaneous Q24H  . fluticasone  1-2 spray Each Nare QHS  . guaiFENesin  1,200 mg Oral BID  . Influenza vac split quadrivalent PF  0.5 mL Intramuscular Tomorrow-1000  . ipratropium-albuterol  3 mL Nebulization Q6H  . lamoTRIgine  25 mg Oral Daily  . linaclotide  145 mcg Oral QAC breakfast  . losartan  50 mg Oral Daily  . montelukast  10 mg Oral QHS  . pantoprazole  40 mg Oral Daily   Continuous Infusions: . azithromycin 500 mg (07/13/18 1504)  . cefTRIAXone (ROCEPHIN)  IV 1 g (07/14/18 0121)   PRN Meds:.acetaminophen **OR** acetaminophen, albuterol, ondansetron **OR** ondansetron (ZOFRAN) IV   PHYSICAL EXAM: Vital signs: Vitals:   07/13/18 2351 07/14/18 0133 07/14/18 0759 07/14/18 0830  BP: 130/76  134/86   Pulse: 94  (!) 108   Resp: 16     Temp: 98.5 F (36.9 C)  100.1 F (37.8 C)   TempSrc: Oral  Oral   SpO2: 98% 94% 96% 96%  Weight:      Height:       Filed Weights   07/11/18 2217 07/12/18 0040 07/12/18 0739  Weight: 67.6 kg 67.7 kg 67.7 kg   Body mass index is 27.29 kg/m.   General appearance:Awake, alert, not in any distress.  Eyes:no scleral icterus. HEENT: Atraumatic and Normocephalic Neck: supple, no JVD. Resp:Good air entry bilaterally, coarse rhonchi all over CVS: S1 S2 regular, no murmurs.  GI: Bowel sounds present, Non tender and not distended with no gaurding, rigidity or rebound. Extremities: B/L Lower Ext shows no edema, both legs are warm to touch Neurology:  Non focal Psychiatric: Normal judgment and insight. Normal mood. Musculoskeletal:No digital cyanosis Skin:No Rash, warm and dry Wounds:N/A  I have personally reviewed following labs and imaging studies  LABORATORY DATA: CBC: Recent Labs  Lab 07/11/18 1732 07/12/18 0255 07/13/18 1107 07/14/18 0256  WBC 27.6* 24.7* 25.4* 29.5*  HGB 12.7 12.4 10.3* 11.0*  HCT 40.1 38.9 32.0* 34.2*  MCV 95.9 94.9 94.4 93.7  PLT 391 366 360 368    Basic Metabolic Panel: Recent Labs  Lab  07/11/18 1732 07/14/18 0256  NA 140 139  K 4.6 4.0  CL 101 105  CO2 25 23  GLUCOSE 77 124*  BUN 12 11  CREATININE 1.32* 1.33*  CALCIUM 9.3 8.4*    GFR: Estimated Creatinine Clearance: 37.5 mL/min (A) (by C-G formula based on SCr of 1.33 mg/dL (H)).  Liver Function Tests: Recent Labs  Lab 07/11/18 2219  AST 23  ALT 16  ALKPHOS 134*  BILITOT 0.9  PROT 8.0  ALBUMIN 3.1*   Recent Labs  Lab 07/11/18 2219  LIPASE 31   No results for input(s): AMMONIA in the last 168 hours.  Coagulation Profile: No results for input(s): INR, PROTIME in the last 168 hours.  Cardiac Enzymes: No results for input(s): CKTOTAL, CKMB, CKMBINDEX, TROPONINI in the last 168 hours.  BNP (last 3 results) No results for input(s): PROBNP in the last 8760 hours.  HbA1C: No results for input(s): HGBA1C in the last 72 hours.  CBG: Recent Labs  Lab 07/12/18 0741 07/12/18 1137 07/12/18 1625 07/12/18 2124 07/13/18 0753  GLUCAP 107* 118* 108*  119* 94    Lipid Profile: No results for input(s): CHOL, HDL, LDLCALC, TRIG, CHOLHDL, LDLDIRECT in the last 72 hours.  Thyroid Function Tests: No results for input(s): TSH, T4TOTAL, FREET4, T3FREE, THYROIDAB in the last 72 hours.  Anemia Panel: No results for input(s): VITAMINB12, FOLATE, FERRITIN, TIBC, IRON, RETICCTPCT in the last 72 hours.  Urine analysis:    Component Value Date/Time   COLORURINE YELLOW 07/11/2018 0046   APPEARANCEUR HAZY (A) 07/11/2018 0046   LABSPEC 1.020 07/11/2018 0046   PHURINE 5.0 07/11/2018 0046   GLUCOSEU NEGATIVE 07/11/2018 0046   HGBUR NEGATIVE 07/11/2018 0046   HGBUR negative 03/11/2008 1317   BILIRUBINUR NEGATIVE 07/11/2018 0046   BILIRUBINUR n 08/07/2017 1558   KETONESUR NEGATIVE 07/11/2018 0046   PROTEINUR 100 (A) 07/11/2018 0046   UROBILINOGEN 1.0 08/07/2017 1558   UROBILINOGEN 0.2 08/16/2014 1513   NITRITE NEGATIVE 07/11/2018 0046   LEUKOCYTESUR MODERATE (A) 07/11/2018 0046    Sepsis Labs: Lactic  Acid, Venous    Component Value Date/Time   LATICACIDVEN 1.11 07/11/2018 2236    MICROBIOLOGY: Recent Results (from the past 240 hour(s))  Urine culture     Status: Abnormal   Collection Time: 07/11/18  9:32 PM  Result Value Ref Range Status   Specimen Description URINE, RANDOM  Final   Special Requests NONE  Final   Culture (A)  Final    <10,000 COLONIES/mL INSIGNIFICANT GROWTH Performed at [facility], [address], [place], [place] [address]    Report Status 07/13/2018 FINAL  Final  Blood Culture (routine x 2)     Status: None (Preliminary result)   Collection Time: 07/11/18 10:19 PM  Result Value Ref Range Status   Specimen Description BLOOD RIGHT FOREARM  Final   Special Requests   Final    BOTTLES DRAWN AEROBIC AND ANAEROBIC Blood Culture adequate volume   Culture   Final    NO GROWTH 3 DAYS Performed at [facility], [address] [place], [place] [address]    Report Status PENDING  Incomplete  Blood Culture (routine x 2)     Status: None (Preliminary result)   Collection Time: 07/11/18 10:19 PM  Result Value Ref Range Status   Specimen Description BLOOD LEFT ANTECUBITAL  Final   Special Requests   Final    BOTTLES DRAWN AEROBIC AND ANAEROBIC Blood Culture results may not be optimal due to an inadequate volume of blood received in culture bottles   Culture   Final    NO GROWTH 3 DAYS Performed at [facility], [address] [place], [place] [address]    Report Status PENDING  Incomplete    RADIOLOGY STUDIES/RESULTS: Dg Chest 2 View  Result Date: 07/11/2018 CLINICAL DATA:  Nausea vomiting and headache x1 week EXAM: CHEST - 2 VIEW COMPARISON:  08/22/2017 FINDINGS: Heart size is normal. There is mild aortic atherosclerosis without aneurysm. No acute pulmonary consolidation. Mild chronic interstitial prominence and bibasilar atelectasis is identified. No overt pulmonary edema. ACDF of the lower cervical spine. No acute osseous abnormality.  IMPRESSION: 1. Chronic mild interstitial lung disease and bibasilar atelectasis. 2. Minimal aortic atherosclerosis. 3. No acute pulmonary disease Electronically Signed   By: Tollie Eth M.D.   On: 07/11/2018 18:08   Ct Chest Wo Contrast  Result Date: 07/11/2018 CLINICAL DATA:  Nausea and vomiting for 1 week. Recurrent chest pain for 6 months. Shortness of breath and dizziness with exertion. Productive cough. EXAM: CT CHEST WITHOUT CONTRAST TECHNIQUE:  Multidetector CT imaging of the chest was performed following the standard protocol without IV contrast. COMPARISON:  High-resolution chest CT 09/26/2017 FINDINGS: Cardiovascular: Normal heart size. Minimal pericardial effusion. Normal caliber thoracic aorta with scattered calcifications. Mediastinum/Nodes: Mild prominence of mediastinal lymph nodes with pretracheal nodes measuring up to about 10 mm short axis dimension. Similar appearance to previous study. Likely to be reactive. Esophagus is decompressed. Thyroid gland is unremarkable. Lungs/Pleura: Scattered areas of interstitial fibrosis in the lungs. Bronchial wall thickening suggesting chronic bronchitis. Bronchiectasis with mucous plugging in the lung bases. Patchy focal areas of consolidation in the lung bases, more prominent on the right. Consolidative changes are new since previous study suggesting interval development of an acute bronchopneumonia. No pleural effusions. No pneumothorax. Upper Abdomen: No acute changes identified. Vascular calcifications. Musculoskeletal: Degenerative changes in the thoracic spine. No destructive bone lesions. IMPRESSION: 1. Patchy areas of consolidation in the lung bases, more prominent on the right, suggesting interval development of acute bronchopneumonia. 2. Chronic bronchitic changes with bronchiectasis and mucous plugging in the lung bases. 3. Mild mediastinal lymphadenopathy, likely reactive. 4. Minimal pericardial effusion. Aortic Atherosclerosis (ICD10-I70.0).  Electronically Signed   By: Burman Nieves M.D.   On: 07/11/2018 23:55   Dg Chest Port 1v Same Day  Result Date: 07/14/2018 CLINICAL DATA:  Shortness of breath EXAM: PORTABLE CHEST 1 VIEW COMPARISON:  CT chest dated 07/11/2018 FINDINGS: Patchy right lower lobe opacity, suspicious for pneumonia. Associated trace right pleural effusion. Bilateral lower lobe bronchiectasis. No pneumothorax. The heart is normal in size. Cervical spine fixation hardware. IMPRESSION: Patchy right lower lobe opacity, suspicious for pneumonia. Associated trace right pleural effusion. Electronically Signed   By: Charline Bills M.D.   On: 07/14/2018 08:20     LOS: 2 days   Jeoffrey Massed, MD  [facility]  If 7PM-7AM, please contact night-coverage  Please page via [email or web address] [email or web address]-click on MD name and type text message  07/14/2018, 9:53 AM

## 2018-07-15 DIAGNOSIS — J471 Bronchiectasis with (acute) exacerbation: Secondary | ICD-10-CM

## 2018-07-15 DIAGNOSIS — J181 Lobar pneumonia, unspecified organism: Secondary | ICD-10-CM

## 2018-07-15 LAB — BASIC METABOLIC PANEL
Anion gap: 10 (ref 5–15)
BUN: 8 mg/dL (ref 8–23)
CO2: 25 mmol/L (ref 22–32)
Calcium: 8.3 mg/dL — ABNORMAL LOW (ref 8.9–10.3)
Chloride: 102 mmol/L (ref 98–111)
Creatinine, Ser: 1.25 mg/dL — ABNORMAL HIGH (ref 0.44–1.00)
GFR calc Af Amer: 51 mL/min — ABNORMAL LOW (ref >60)
GFR calc non Af Amer: 44 mL/min — ABNORMAL LOW (ref >60)
Glucose, Bld: 120 mg/dL — ABNORMAL HIGH (ref 70–99)
Potassium: 4 mmol/L (ref 3.5–5.1)
Sodium: 137 mmol/L (ref 135–145)

## 2018-07-15 LAB — CBC WITH DIFFERENTIAL/PLATELET
Basophils Absolute: 0 10*3/uL (ref 0.0–0.1)
Basophils Relative: 0 %
Eosinophils Absolute: 20.5 10*3/uL — ABNORMAL HIGH (ref 0.0–0.7)
Eosinophils Relative: 66 %
HCT: 32.8 % — ABNORMAL LOW (ref 36.0–46.0)
Hemoglobin: 10.8 g/dL — ABNORMAL LOW (ref 12.0–15.0)
Lymphocytes Relative: 10 %
Lymphs Abs: 3.1 10*3/uL (ref 0.7–4.0)
MCH: 30.9 pg (ref 26.0–34.0)
MCHC: 32.9 g/dL (ref 30.0–36.0)
MCV: 93.7 fL (ref 78.0–100.0)
Monocytes Absolute: 0.9 10*3/uL (ref 0.1–1.0)
Monocytes Relative: 3 %
Neutro Abs: 6.5 10*3/uL (ref 1.7–7.7)
Neutrophils Relative %: 21 %
Platelets: 368 10*3/uL (ref 150–400)
RBC: 3.5 MIL/uL — ABNORMAL LOW (ref 3.87–5.11)
RDW: 14.1 % (ref 11.5–15.5)
WBC: 31 10*3/uL — ABNORMAL HIGH (ref 4.0–10.5)

## 2018-07-15 MED ORDER — SODIUM CHLORIDE 0.9 % IV SOLN
2.0000 g | Freq: Two times a day (BID) | INTRAVENOUS | Status: DC
  Administered 2018-07-15 – 2018-07-16 (×3): 2 g via INTRAVENOUS
  Filled 2018-07-15 (×3): qty 2

## 2018-07-15 MED ORDER — BUDESONIDE 0.25 MG/2ML IN SUSP
0.2500 mg | Freq: Two times a day (BID) | RESPIRATORY_TRACT | Status: DC
  Administered 2018-07-15 – 2018-07-17 (×4): 0.25 mg via RESPIRATORY_TRACT
  Filled 2018-07-15 (×5): qty 2

## 2018-07-15 MED ORDER — VANCOMYCIN HCL IN DEXTROSE 1-5 GM/200ML-% IV SOLN
1000.0000 mg | Freq: Once | INTRAVENOUS | Status: AC
  Administered 2018-07-15: 1000 mg via INTRAVENOUS
  Filled 2018-07-15: qty 200

## 2018-07-15 MED ORDER — SALINE SPRAY 0.65 % NA SOLN
2.0000 | Freq: Two times a day (BID) | NASAL | Status: DC
  Administered 2018-07-15 – 2018-07-20 (×8): 2 via NASAL
  Filled 2018-07-15: qty 44

## 2018-07-15 MED ORDER — VANCOMYCIN HCL IN DEXTROSE 1-5 GM/200ML-% IV SOLN
1000.0000 mg | INTRAVENOUS | Status: DC
  Administered 2018-07-16 – 2018-07-17 (×2): 1000 mg via INTRAVENOUS
  Filled 2018-07-15 (×2): qty 200

## 2018-07-15 MED ORDER — SODIUM CHLORIDE 3 % IN NEBU
4.0000 mL | INHALATION_SOLUTION | Freq: Two times a day (BID) | RESPIRATORY_TRACT | Status: DC
  Administered 2018-07-15 – 2018-07-17 (×3): 4 mL via RESPIRATORY_TRACT
  Filled 2018-07-15 (×7): qty 4

## 2018-07-15 NOTE — Care Management Important Message (Signed)
Important Message  Patient Details  Name: [name] MRN: [id number] Date of Birth: 04-10-1951   Medicare Important Message Given:  Yes    Dorena Bodo 07/15/2018, 2:39 PM
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
## 2018-07-15 NOTE — Care Management Note (Signed)
Case Management Note  Patient Details  Name: Desiree [name] MRN: [id number] Date of Birth: [birth year]  Subjective/Objective:  From home alone, pta indep copd ex, CAP, cxr with worsening r side infiltrate, cont with iv abx, ? Hypertrophic cardiomyopathy per MD note.                  Action/Plan: NCM will follow for transition of care needs.   Expected Discharge Date:                  Expected Discharge Plan:     In-House Referral:     Discharge planning Services  CM Consult  Post Acute Care Choice:    Choice offered to:     DME Arranged:    DME Agency:     HH Arranged:    HH Agency:     Status of Service:  In process, will continue to follow  If discussed at Long Length of Stay Meetings, dates discussed:    Additional Comments:  Leone Haven, RN 07/15/2018, 9:47 AM

## 2018-07-15 NOTE — Care Management Important Message (Signed)
Important Message  Patient Details  Name: Desiree [name] MRN: [id number] Date of Birth: May 24, 1951   Medicare Important Message Given:  Yes    Dorena Bodo 07/15/2018, 2:40 PM

## 2018-07-15 NOTE — Progress Notes (Signed)
PROGRESS NOTE        PATIENT DETAILS Name: Desiree [name] Age: 67 y.o. Sex: female Date of Birth: [birth year] Admit Date: 07/11/2018 Admitting Physician Ejiroghene Wendall Stade, MD DHW:YSHUOH, Neta Mends, MD  Brief Narrative: Patient is a 67 y.o. female with prior history of bronchiectasis, COPD, DM, hypertension resenting with worsening cough and increased sputum production-was gradually progressed to worsening shortness of breath over the past week or so, she presented to the emergency room where she was thought to have pneumonia along with COPD exacerbation.  She was subsequently admitted to the hospitalist service.  See below for further details.  Subjective: Cough is decreased but continues to have shortness of breath.  She is still not at her usual baseline.  Assessment/Plan: Community-acquired pneumonia: Her shortness of breath has improved-however her white count continues to increase-although she did receive 1 dose of Solu-Medrol on admission-I suspect leukocytosis now is from underlying pneumonia.  Given a history of bronchiectasis-we will broaden coverage to include vancomycin and cefepime-we will stop Rocephin and Zithromax.  Blood cultures negative. If Leukocytosis continues-may need to reimage along to rule out empyema.  COPD/bronchiectasis exacerbation: Somewhat improved but continues to have wheezing-improvement seems to have plateaued-continue bronchodilators, Mucinex, aggressive chest PT, Mucomyst nebs.  She unfortunately is allergic to Solu-Medrol and hydrocortisone-remains on budesonide nebs.  She still continues to be rhonchorous in spite of above-noted measures-have asked PCCM to evaluate and provide further recommendations.    ?  Hypertrophic cardiomyopathy: Echo in 2018 showed septal asymmetry-given significant amount of rhonchi-hold off on starting beta-blockers.  We can contemplate starting beta-blockers closer to discharge when she is much better.     Hypertension: BP controlled-continue losartan  CKD stage III: Creatinine close to usual baseline-follow.  Dyslipidemia: Continue statin  GERD: Continue PPI  Bipolar disorder/anxiety: Stable-continue Cymbalta, Lamictal  DVT Prophylaxis: Prophylactic Lovenox   Code Status: Full code   Family Communication: None at bedside  Disposition Plan: Remain inpatient-will require several more days of hospitalization before consideration of discharge.  Antimicrobial agents: Anti-infectives (From admission, onward)   Start     Dose/Rate Route Frequency Ordered Stop   07/13/18 2000  levofloxacin (LEVAQUIN) tablet 750 mg  Status:  Discontinued     750 mg Oral Every 48 hours 07/11/18 2240 07/12/18 0726   07/12/18 1600  azithromycin (ZITHROMAX) 500 mg in sodium chloride 0.9 % 250 mL IVPB     500 mg 250 mL/hr over 60 Minutes Intravenous Every 24 hours 07/12/18 0056     07/12/18 0100  cefTRIAXone (ROCEPHIN) 1 g in sodium chloride 0.9 % 100 mL IVPB     1 g 200 mL/hr over 30 Minutes Intravenous Every 24 hours 07/12/18 0056     07/11/18 2145  levofloxacin (LEVAQUIN) IVPB 750 mg     750 mg 100 mL/hr over 90 Minutes Intravenous  Once 07/11/18 2132 07/12/18 0015      Procedures: None  CONSULTS:  None  Time spent: 25- minutes-Greater than 50% of this time was spent in counseling, explanation of diagnosis, planning of further management, and coordination of care.  MEDICATIONS: Scheduled Meds: . acetylcysteine  4 mL Nebulization TID  . atorvastatin  20 mg Oral Daily  . budesonide (PULMICORT) nebulizer solution  0.25 mg Nebulization BID  . DULoxetine  60 mg Oral Daily  . enoxaparin (LOVENOX) injection  40 mg Subcutaneous Q24H  .  fluticasone  1-2 spray Each Nare QHS  . guaiFENesin  1,200 mg Oral BID  . ipratropium-albuterol  3 mL Nebulization TID  . lamoTRIgine  25 mg Oral Daily  . linaclotide  145 mcg Oral QAC breakfast  . losartan  50 mg Oral Daily  . montelukast  10 mg Oral QHS   . pantoprazole  40 mg Oral Daily   Continuous Infusions: . azithromycin 500 mg (07/14/18 1704)  . cefTRIAXone (ROCEPHIN)  IV 1 g (07/15/18 0120)   PRN Meds:.acetaminophen **OR** acetaminophen, albuterol, ondansetron **OR** ondansetron (ZOFRAN) IV   PHYSICAL EXAM: Vital signs: Vitals:   07/14/18 1710 07/14/18 2020 07/14/18 2306 07/15/18 0825  BP: 139/77  (!) 141/68 139/85  Pulse: 97 96 97 (!) 102  Resp:  18 18   Temp: 98.7 F (37.1 C)  98.6 F (37 C) 99.3 F (37.4 C)  TempSrc: Oral  Oral Oral  SpO2: 96%  97% 96%  Weight:      Height:       Filed Weights   07/11/18 2217 07/12/18 0040 07/12/18 0739  Weight: 67.6 kg 67.7 kg 67.7 kg   Body mass index is 27.29 kg/m.   General appearance:Awake, alert, not in any distress.  Eyes:no scleral icterus. HEENT: Atraumatic and Normocephalic Neck: supple, no JVD. Resp:Good air entry bilaterally, coarse rhonchi all over CVS: S1 S2 regular, no murmurs.  GI: Bowel sounds present, Non tender and not distended with no gaurding, rigidity or rebound. Extremities: B/L Lower Ext shows no edema, both legs are warm to touch Neurology:  Non focal Psychiatric: Normal judgment and insight. Normal mood. Musculoskeletal:No digital cyanosis Skin:No Rash, warm and dry Wounds:N/A  I have personally reviewed following labs and imaging studies  LABORATORY DATA: CBC: Recent Labs  Lab 07/11/18 1732 07/12/18 0255 07/13/18 1107 07/14/18 0256 07/15/18 0247  WBC 27.6* 24.7* 25.4* 29.5* 31.0*  NEUTROABS  --   --   --   --  PENDING  HGB 12.7 12.4 10.3* 11.0* 10.8*  HCT 40.1 38.9 32.0* 34.2* 32.8*  MCV 95.9 94.9 94.4 93.7 93.7  PLT 391 366 360 368 368    Basic Metabolic Panel: Recent Labs  Lab 07/11/18 1732 07/14/18 0256 07/15/18 0247  NA 140 139 137  K 4.6 4.0 4.0  CL 101 105 102  CO2 25 23 25   GLUCOSE 77 124* 120*  BUN 12 11 8   CREATININE 1.32* 1.33* 1.25*  CALCIUM 9.3 8.4* 8.3*    GFR: Estimated Creatinine Clearance: 39.9  mL/min (A) (by C-G formula based on SCr of 1.25 mg/dL (H)).  Liver Function Tests: Recent Labs  Lab 07/11/18 2219  AST 23  ALT 16  ALKPHOS 134*  BILITOT 0.9  PROT 8.0  ALBUMIN 3.1*   Recent Labs  Lab 07/11/18 2219  LIPASE 31   No results for input(s): AMMONIA in the last 168 hours.  Coagulation Profile: No results for input(s): INR, PROTIME in the last 168 hours.  Cardiac Enzymes: No results for input(s): CKTOTAL, CKMB, CKMBINDEX, TROPONINI in the last 168 hours.  BNP (last 3 results) No results for input(s): PROBNP in the last 8760 hours.  HbA1C: No results for input(s): HGBA1C in the last 72 hours.  CBG: Recent Labs  Lab 07/12/18 0741 07/12/18 1137 07/12/18 1625 07/12/18 2124 07/13/18 0753  GLUCAP 107* 118* 108* 119* 94    Lipid Profile: No results for input(s): CHOL, HDL, LDLCALC, TRIG, CHOLHDL, LDLDIRECT in the last 72 hours.  Thyroid Function Tests: No results for input(s): TSH,  T4TOTAL, FREET4, T3FREE, THYROIDAB in the last 72 hours.  Anemia Panel: No results for input(s): VITAMINB12, FOLATE, FERRITIN, TIBC, IRON, RETICCTPCT in the last 72 hours.  Urine analysis:    Component Value Date/Time   COLORURINE YELLOW 07/11/2018 0046   APPEARANCEUR HAZY (A) 07/11/2018 0046   LABSPEC 1.020 07/11/2018 0046   PHURINE 5.0 07/11/2018 0046   GLUCOSEU NEGATIVE 07/11/2018 0046   HGBUR NEGATIVE 07/11/2018 0046   HGBUR negative 03/11/2008 1317   BILIRUBINUR NEGATIVE 07/11/2018 0046   BILIRUBINUR n 08/07/2017 1558   KETONESUR NEGATIVE 07/11/2018 0046   PROTEINUR 100 (A) 07/11/2018 0046   UROBILINOGEN 1.0 08/07/2017 1558   UROBILINOGEN 0.2 08/16/2014 1513   NITRITE NEGATIVE 07/11/2018 0046   LEUKOCYTESUR MODERATE (A) 07/11/2018 0046    Sepsis Labs: Lactic Acid, Venous    Component Value Date/Time   LATICACIDVEN 1.11 07/11/2018 2236    MICROBIOLOGY: Recent Results (from the past 240 hour(s))  Urine culture     Status: Abnormal   Collection Time:  07/11/18  9:32 PM  Result Value Ref Range Status   Specimen Description URINE, RANDOM  Final   Special Requests NONE  Final   Culture (A)  Final    <10,000 COLONIES/mL INSIGNIFICANT GROWTH Performed at [facility], [address], [place], [place] [address]    Report Status 07/13/2018 FINAL  Final  Blood Culture (routine x 2)     Status: None (Preliminary result)   Collection Time: 07/11/18 10:19 PM  Result Value Ref Range Status   Specimen Description BLOOD RIGHT FOREARM  Final   Special Requests   Final    BOTTLES DRAWN AEROBIC AND ANAEROBIC Blood Culture adequate volume   Culture   Final    NO GROWTH 4 DAYS Performed at [facility], [address] [place], [place] [address]    Report Status PENDING  Incomplete  Blood Culture (routine x 2)     Status: None (Preliminary result)   Collection Time: 07/11/18 10:19 PM  Result Value Ref Range Status   Specimen Description BLOOD LEFT ANTECUBITAL  Final   Special Requests   Final    BOTTLES DRAWN AEROBIC AND ANAEROBIC Blood Culture results may not be optimal due to an inadequate volume of blood received in culture bottles   Culture   Final    NO GROWTH 4 DAYS Performed at [facility], [address], [place], [place] [address]    Report Status PENDING  Incomplete    RADIOLOGY STUDIES/RESULTS: Dg Chest 2 View  Result Date: 07/11/2018 CLINICAL DATA:  Nausea vomiting and headache x1 week EXAM: CHEST - 2 VIEW COMPARISON:  08/22/2017 FINDINGS: Heart size is normal. There is mild aortic atherosclerosis without aneurysm. No acute pulmonary consolidation. Mild chronic interstitial prominence and bibasilar atelectasis is identified. No overt pulmonary edema. ACDF of the lower cervical spine. No acute osseous abnormality. IMPRESSION: 1. Chronic mild interstitial lung disease and bibasilar atelectasis. 2. Minimal aortic atherosclerosis. 3. No acute pulmonary disease Electronically Signed   By: Tollie Eth M.D.   On:  07/11/2018 18:08   Ct Chest Wo Contrast  Result Date: 07/11/2018 CLINICAL DATA:  Nausea and vomiting for 1 week. Recurrent chest pain for 6 months. Shortness of breath and dizziness with exertion. Productive cough. EXAM: CT CHEST WITHOUT CONTRAST TECHNIQUE: Multidetector CT imaging of the chest was performed following the standard protocol without IV contrast. COMPARISON:  High-resolution chest CT 09/26/2017 FINDINGS: Cardiovascular: Normal heart size. Minimal pericardial effusion. Normal caliber  thoracic aorta with scattered calcifications. Mediastinum/Nodes: Mild prominence of mediastinal lymph nodes with pretracheal nodes measuring up to about 10 mm short axis dimension. Similar appearance to previous study. Likely to be reactive. Esophagus is decompressed. Thyroid gland is unremarkable. Lungs/Pleura: Scattered areas of interstitial fibrosis in the lungs. Bronchial wall thickening suggesting chronic bronchitis. Bronchiectasis with mucous plugging in the lung bases. Patchy focal areas of consolidation in the lung bases, more prominent on the right. Consolidative changes are new since previous study suggesting interval development of an acute bronchopneumonia. No pleural effusions. No pneumothorax. Upper Abdomen: No acute changes identified. Vascular calcifications. Musculoskeletal: Degenerative changes in the thoracic spine. No destructive bone lesions. IMPRESSION: 1. Patchy areas of consolidation in the lung bases, more prominent on the right, suggesting interval development of acute bronchopneumonia. 2. Chronic bronchitic changes with bronchiectasis and mucous plugging in the lung bases. 3. Mild mediastinal lymphadenopathy, likely reactive. 4. Minimal pericardial effusion. Aortic Atherosclerosis (ICD10-I70.0). Electronically Signed   By: Burman Nieves M.D.   On: 07/11/2018 23:55   Dg Chest Port 1v Same Day  Result Date: 07/14/2018 CLINICAL DATA:  Shortness of breath EXAM: PORTABLE CHEST 1 VIEW  COMPARISON:  CT chest dated 07/11/2018 FINDINGS: Patchy right lower lobe opacity, suspicious for pneumonia. Associated trace right pleural effusion. Bilateral lower lobe bronchiectasis. No pneumothorax. The heart is normal in size. Cervical spine fixation hardware. IMPRESSION: Patchy right lower lobe opacity, suspicious for pneumonia. Associated trace right pleural effusion. Electronically Signed   By: Charline Bills M.D.   On: 07/14/2018 08:20     LOS: 3 days   Jeoffrey Massed, MD  [facility]  If 7PM-7AM, please contact night-coverage  Please page via [email or web address]-Password [email or web address]-click on MD name and type text message  07/15/2018, 8:38 AM

## 2018-07-15 NOTE — Progress Notes (Signed)
Pharmacy Antibiotic Note  Desiree [name] is a 67 y.o. female admitted on 07/11/2018 with SOB and cough.  Pharmacy has been consulted for vancomycin and cefepime dosing for PNA and needing more broad coverage. SCr 1.25, est CrCl ~ 39 ml/min.  Plan: Vancomycin 1000 mg IV every 12 hours.  Goal trough 15-20 mcg/mL.  Cefepime 2 g IV q12h Monitor renal function, clinical progress, LOT Vancomycin trough as clinically indicated   Height: 5' 2.01" (157.5 cm) Weight: 149 lb 4 oz (67.7 kg) IBW/kg (Calculated) : 50.12  Temp (24hrs), Avg:98.9 F (37.2 C), Min:98.6 F (37 C), Max:99.3 F (37.4 C)  Recent Labs  Lab 07/11/18 1732 07/11/18 2236 07/12/18 0255 07/13/18 1107 07/14/18 0256 07/15/18 0247  WBC 27.6*  --  24.7* 25.4* 29.5* 31.0*  CREATININE 1.32*  --   --   --  1.33* 1.25*  LATICACIDVEN  --  1.11  --   --   --   --     Estimated Creatinine Clearance: 39.9 mL/min (A) (by C-G formula based on SCr of 1.25 mg/dL (H)).    Allergies  Allergen Reactions  . Prednisone Shortness Of Breath, Itching, Nausea And Vomiting and Palpitations  . Solu-Medrol [Methylprednisolone] Anaphylaxis  . Amoxicillin Hives and Rash    Has patient had a PCN reaction causing immediate rash, facial/tongue/throat swelling, SOB or lightheadedness with hypotension:Yes Has patient had a PCN reaction causing severe rash involving mucus membranes or skin necrosis: NO Has patient had a PCN reaction that required hospitalization NO Has patient had a PCN reaction occurring within the last 10 years: NO If all of the above answers are "NO", then may proceed with Cephalosporin use.   . Codeine Hives, Itching and Rash  . Hydrocodone-Acetaminophen Hives, Itching and Rash  . Penicillins Hives, Itching and Rash    ALLERGIC REACTION TO ORAL AMOXICILLIN Has patient had a PCN reaction causing immediate rash, facial/tongue/throat swelling, SOB or lightheadedness with hypotension: Yes Has patient had a PCN reaction causing severe  rash involving mucus membranes or skin necrosis: No Has patient had a PCN reaction that required hospitalization: No Has patient had a PCN reaction occurring within the last 10 years: No If all of the above answers are "NO", then may proceed with Cephalosporin use.    Antimicrobials this admission: Levaquin x1 9/19 azith 9/20 >> 9/23 Ceftriaxone 9/20 >> 9/23 vancomycin 9/23 >>  cefepime 9/23 >>   Dose adjustments this admission:   Microbiology results: 9/19 BCx: ngtd 9/19 UCx: insignificant growth    Thank you for allowing pharmacy to be a part of this patient's care.  Loura Back, PharmD, BCPS Clinical Pharmacist Clinical phone for 07/15/2018 until 3p is [phone] Please check AMION for all Pharmacist numbers by unit 07/15/2018 8:48 AM

## 2018-07-16 ENCOUNTER — Inpatient Hospital Stay (HOSPITAL_COMMUNITY): Payer: Medicare HMO

## 2018-07-16 DIAGNOSIS — J189 Pneumonia, unspecified organism: Principal | ICD-10-CM

## 2018-07-16 LAB — CULTURE, BLOOD (ROUTINE X 2)
Culture: NO GROWTH
Culture: NO GROWTH
Special Requests: ADEQUATE

## 2018-07-16 LAB — CBC WITH DIFFERENTIAL/PLATELET
Basophils Absolute: 0.3 10*3/uL — ABNORMAL HIGH (ref 0.0–0.1)
Basophils Relative: 1 %
Eosinophils Absolute: 19.2 10*3/uL — ABNORMAL HIGH (ref 0.0–0.7)
Eosinophils Relative: 70 %
HCT: 35.7 % — ABNORMAL LOW (ref 36.0–46.0)
Hemoglobin: 11.4 g/dL — ABNORMAL LOW (ref 12.0–15.0)
Lymphocytes Relative: 9 %
Lymphs Abs: 2.5 10*3/uL (ref 0.7–4.0)
MCH: 29.6 pg (ref 26.0–34.0)
MCHC: 31.9 g/dL (ref 30.0–36.0)
MCV: 92.7 fL (ref 78.0–100.0)
Monocytes Absolute: 0.5 10*3/uL (ref 0.1–1.0)
Monocytes Relative: 2 %
Neutro Abs: 4.9 10*3/uL (ref 1.7–7.7)
Neutrophils Relative %: 18 %
Platelets: 378 10*3/uL (ref 150–400)
RBC: 3.85 MIL/uL — ABNORMAL LOW (ref 3.87–5.11)
RDW: 14 % (ref 11.5–15.5)
WBC: 27.4 10*3/uL — ABNORMAL HIGH (ref 4.0–10.5)

## 2018-07-16 LAB — BASIC METABOLIC PANEL
Anion gap: 10 (ref 5–15)
BUN: 9 mg/dL (ref 8–23)
CO2: 24 mmol/L (ref 22–32)
Calcium: 8.4 mg/dL — ABNORMAL LOW (ref 8.9–10.3)
Chloride: 101 mmol/L (ref 98–111)
Creatinine, Ser: 1.29 mg/dL — ABNORMAL HIGH (ref 0.44–1.00)
GFR calc Af Amer: 49 mL/min — ABNORMAL LOW (ref >60)
GFR calc non Af Amer: 42 mL/min — ABNORMAL LOW (ref >60)
Glucose, Bld: 97 mg/dL (ref 70–99)
Potassium: 3.7 mmol/L (ref 3.5–5.1)
Sodium: 135 mmol/L (ref 135–145)

## 2018-07-16 LAB — PATHOLOGIST SMEAR REVIEW

## 2018-07-16 MED ORDER — SODIUM CHLORIDE 0.9 % IV SOLN
2.0000 g | INTRAVENOUS | Status: DC
  Administered 2018-07-17: 2 g via INTRAVENOUS
  Filled 2018-07-16: qty 2

## 2018-07-16 MED ORDER — FAMOTIDINE 20 MG PO TABS
20.0000 mg | ORAL_TABLET | Freq: Once | ORAL | Status: AC | PRN
  Administered 2018-07-16: 20 mg via ORAL
  Filled 2018-07-16: qty 1

## 2018-07-16 NOTE — Progress Notes (Signed)
PROGRESS NOTE        PATIENT DETAILS Name: Desiree [name] Age: 67 y.o. Sex: female Date of Birth: 1951/03/04 Admit Date: 07/11/2018 Admitting Physician Ejiroghene Wendall Stade, MD DZH:[id number], Neta Mends, MD  Brief Narrative: Patient is a 67 y.o. female with prior history of bronchiectasis, COPD, DM, hypertension resenting with worsening cough and increased sputum production-was gradually progressed to worsening shortness of breath over the past week or so, she presented to the emergency room where she was thought to have pneumonia along with COPD exacerbation.  She was subsequently admitted to the hospitalist service.  See below for further details.  Subjective: Slowly improving-cough has decreased-shortness of breath is improving-leukocytosis is downtrending-she is still not had her usual baseline.  Assessment/Plan: Community-acquired pneumonia: Somewhat improved-cough becoming more productive-and she is able to get sputum out after initiation of Mucomyst nebs along with incentive spirometry, chest PT/vibrator vest.  Due to worsening leukocytosis-she was switched to vancomycin and cefepime on 9/23 (bronchiectasis-so concern for drug-resistant organisms like MRSA and Pseudomonas) thankfully leukocytosis has started to trend down.  All cultures negative so far.  PCCM following-hopefully if clinical improvement continues and leukocytosis downtrends further-we will be able to switch her to oral agents in the next few days.  COPD/bronchiectasis exacerbation: Slow improvement continues-still wheezing-still not back to her baseline-unable to use steroids as she had an allergic reaction to Solu-Medrol post admission, she apparently has known allergic reaction to prednisone and hydrocortisone.  Currently being managed with scheduled bronchodilators, Mucinex, Mucomyst nebs and vibrator vest/chest PT.  Appreciate PCCM evaluation.    ?  Hypertrophic cardiomyopathy: Echo in 2018 showed  septal asymmetry, holding off on starting beta-blockers as she is still very rhonchorous/bronchospasm.  Once improvement occurs further-suspect we can use a very selective beta-blocker closer to discharge.  Hypertension: Controlled-continue losartan  CKD stage III: Creatinine close to usual baseline-follow.  Dyslipidemia: Continue statin  GERD: Continue PPI  Bipolar disorder/anxiety: Stable-continue Cymbalta, Lamictal  DVT Prophylaxis: Prophylactic Lovenox   Code Status: Full code   Family Communication: None at bedside  Disposition Plan: Remain inpatient-will require several more days of hospitalization before consideration of discharge.  Antimicrobial agents: Anti-infectives (From admission, onward)   Start     Dose/Rate Route Frequency Ordered Stop   07/17/18 1000  ceFEPIme (MAXIPIME) 2 g in sodium chloride 0.9 % 100 mL IVPB     2 g 200 mL/hr over 30 Minutes Intravenous Every 24 hours 07/16/18 1010     07/16/18 1100  vancomycin (VANCOCIN) IVPB 1000 mg/200 mL premix     1,000 mg 200 mL/hr over 60 Minutes Intravenous Every 24 hours 07/15/18 1427     07/15/18 1000  vancomycin (VANCOCIN) IVPB 1000 mg/200 mL premix     1,000 mg 200 mL/hr over 60 Minutes Intravenous  Once 07/15/18 0851 07/15/18 1231   07/15/18 0930  ceFEPIme (MAXIPIME) 2 g in sodium chloride 0.9 % 100 mL IVPB  Status:  Discontinued     2 g 200 mL/hr over 30 Minutes Intravenous Every 12 hours 07/15/18 0851 07/16/18 1010   07/13/18 2000  levofloxacin (LEVAQUIN) tablet 750 mg  Status:  Discontinued     750 mg Oral Every 48 hours 07/11/18 2240 07/12/18 0726   07/12/18 1600  azithromycin (ZITHROMAX) 500 mg in sodium chloride 0.9 % 250 mL IVPB  Status:  Discontinued     500  mg 250 mL/hr over 60 Minutes Intravenous Every 24 hours 07/12/18 0056 07/15/18 0841   07/12/18 0100  cefTRIAXone (ROCEPHIN) 1 g in sodium chloride 0.9 % 100 mL IVPB  Status:  Discontinued     1 g 200 mL/hr over 30 Minutes Intravenous Every 24  hours 07/12/18 0056 07/15/18 0841   07/11/18 2145  levofloxacin (LEVAQUIN) IVPB 750 mg     750 mg 100 mL/hr over 90 Minutes Intravenous  Once 07/11/18 2132 07/12/18 0015      Procedures: None  CONSULTS:  None  Time spent: 25- minutes-Greater than 50% of this time was spent in counseling, explanation of diagnosis, planning of further management, and coordination of care.  MEDICATIONS: Scheduled Meds: . acetylcysteine  4 mL Nebulization TID  . atorvastatin  20 mg Oral Daily  . budesonide (PULMICORT) nebulizer solution  0.25 mg Nebulization BID  . DULoxetine  60 mg Oral Daily  . enoxaparin (LOVENOX) injection  40 mg Subcutaneous Q24H  . fluticasone  1-2 spray Each Nare QHS  . guaiFENesin  1,200 mg Oral BID  . ipratropium-albuterol  3 mL Nebulization TID  . lamoTRIgine  25 mg Oral Daily  . linaclotide  145 mcg Oral QAC breakfast  . losartan  50 mg Oral Daily  . montelukast  10 mg Oral QHS  . pantoprazole  40 mg Oral Daily  . sodium chloride  2 spray Each Nare BID  . sodium chloride HYPERTONIC  4 mL Nebulization BID   Continuous Infusions: . [START ON 07/17/2018] ceFEPime (MAXIPIME) IV    . vancomycin     PRN Meds:.acetaminophen **OR** acetaminophen, albuterol, ondansetron **OR** ondansetron (ZOFRAN) IV   PHYSICAL EXAM: General appearance:Awake, alert, not in any distress.  Eyes:no scleral icterus. HEENT: Atraumatic and Normocephalic Neck: supple, no JVD. Resp:Good air entry bilaterally, rhonchi all over CVS: S1 S2 regular, no murmurs.  GI: Bowel sounds present, Non tender and not distended with no gaurding, rigidity or rebound. Extremities: B/L Lower Ext shows no edema, both legs are warm to touch Neurology:  Non focal Psychiatric: Normal judgment and insight. Normal mood. Musculoskeletal:No digital cyanosis Skin:No Rash, warm and dry Wounds:N/A   Vital signs: Vitals:   07/16/18 0036 07/16/18 0632 07/16/18 0810 07/16/18 0844  BP: 136/73   (!) 146/77  Pulse: (!)  108   (!) 102  Resp: 20     Temp: 99.9 F (37.7 C)   98.9 F (37.2 C)  TempSrc: Oral   Oral  SpO2: 92%  95% 92%  Weight:  67 kg    Height:       Filed Weights   07/12/18 0040 07/12/18 0739 07/16/18 0632  Weight: 67.7 kg 67.7 kg 67 kg   Body mass index is 27.01 kg/m.   General appearance:Awake, alert, not in any distress.  Eyes:no scleral icterus. HEENT: Atraumatic and Normocephalic Neck: supple, no JVD. Resp:Good air entry bilaterally, CVS: S1 S2 regular, no murmurs.  GI: Bowel sounds present, Non tender and not distended with no gaurding, rigidity or rebound. Extremities: B/L Lower Ext shows no edema, both legs are warm to touch Neurology:  Non focal Psychiatric: Normal judgment and insight. Normal mood. Musculoskeletal:No digital cyanosis Skin:No Rash, warm and dry Wounds:N/A  I have personally reviewed following labs and imaging studies  LABORATORY DATA: CBC: Recent Labs  Lab 07/12/18 0255 07/13/18 1107 07/14/18 0256 07/15/18 0247 07/16/18 0329  WBC 24.7* 25.4* 29.5* 31.0* 27.4*  NEUTROABS  --   --   --  6.5 4.9  HGB  12.4 10.3* 11.0* 10.8* 11.4*  HCT 38.9 32.0* 34.2* 32.8* 35.7*  MCV 94.9 94.4 93.7 93.7 92.7  PLT 366 360 368 368 378    Basic Metabolic Panel: Recent Labs  Lab 07/11/18 1732 07/14/18 0256 07/15/18 0247 07/16/18 0329  NA 140 139 137 135  K 4.6 4.0 4.0 3.7  CL 101 105 102 101  CO2 25 23 25 24   GLUCOSE 77 124* 120* 97  BUN 12 11 8 9   CREATININE 1.32* 1.33* 1.25* 1.29*  CALCIUM 9.3 8.4* 8.3* 8.4*    GFR: Estimated Creatinine Clearance: 38.5 mL/min (A) (by C-G formula based on SCr of 1.29 mg/dL (H)).  Liver Function Tests: Recent Labs  Lab 07/11/18 2219  AST 23  ALT 16  ALKPHOS 134*  BILITOT 0.9  PROT 8.0  ALBUMIN 3.1*   Recent Labs  Lab 07/11/18 2219  LIPASE 31   No results for input(s): AMMONIA in the last 168 hours.  Coagulation Profile: No results for input(s): INR, PROTIME in the last 168 hours.  Cardiac  Enzymes: No results for input(s): CKTOTAL, CKMB, CKMBINDEX, TROPONINI in the last 168 hours.  BNP (last 3 results) No results for input(s): PROBNP in the last 8760 hours.  HbA1C: No results for input(s): HGBA1C in the last 72 hours.  CBG: Recent Labs  Lab 07/12/18 0741 07/12/18 1137 07/12/18 1625 07/12/18 2124 07/13/18 0753  GLUCAP 107* 118* 108* 119* 94    Lipid Profile: No results for input(s): CHOL, HDL, LDLCALC, TRIG, CHOLHDL, LDLDIRECT in the last 72 hours.  Thyroid Function Tests: No results for input(s): TSH, T4TOTAL, FREET4, T3FREE, THYROIDAB in the last 72 hours.  Anemia Panel: No results for input(s): VITAMINB12, FOLATE, FERRITIN, TIBC, IRON, RETICCTPCT in the last 72 hours.  Urine analysis:    Component Value Date/Time   COLORURINE YELLOW 07/11/2018 0046   APPEARANCEUR HAZY (A) 07/11/2018 0046   LABSPEC 1.020 07/11/2018 0046   PHURINE 5.0 07/11/2018 0046   GLUCOSEU NEGATIVE 07/11/2018 0046   HGBUR NEGATIVE 07/11/2018 0046   HGBUR negative 03/11/2008 1317   BILIRUBINUR NEGATIVE 07/11/2018 0046   BILIRUBINUR n 08/07/2017 1558   KETONESUR NEGATIVE 07/11/2018 0046   PROTEINUR 100 (A) 07/11/2018 0046   UROBILINOGEN 1.0 08/07/2017 1558   UROBILINOGEN 0.2 08/16/2014 1513   NITRITE NEGATIVE 07/11/2018 0046   LEUKOCYTESUR MODERATE (A) 07/11/2018 0046    Sepsis Labs: Lactic Acid, Venous    Component Value Date/Time   LATICACIDVEN 1.11 07/11/2018 2236    MICROBIOLOGY: Recent Results (from the past 240 hour(s))  Urine culture     Status: Abnormal   Collection Time: 07/11/18  9:32 PM  Result Value Ref Range Status   Specimen Description URINE, RANDOM  Final   Special Requests NONE  Final   Culture (A)  Final    <10,000 COLONIES/mL INSIGNIFICANT GROWTH Performed at [facility], [address], [place], [place] [address]    Report Status 07/13/2018 FINAL  Final  Blood Culture (routine x 2)     Status: None   Collection Time: 07/11/18 10:19 PM    Result Value Ref Range Status   Specimen Description BLOOD RIGHT FOREARM  Final   Special Requests   Final    BOTTLES DRAWN AEROBIC AND ANAEROBIC Blood Culture adequate volume   Culture   Final    NO GROWTH 5 DAYS Performed at [facility], [address], [place], [place] [address]    Report Status 07/16/2018 FINAL  Final  Blood Culture (routine  x 2)     Status: None   Collection Time: 07/11/18 10:19 PM  Result Value Ref Range Status   Specimen Description BLOOD LEFT ANTECUBITAL  Final   Special Requests   Final    BOTTLES DRAWN AEROBIC AND ANAEROBIC Blood Culture results may not be optimal due to an inadequate volume of blood received in culture bottles   Culture   Final    NO GROWTH 5 DAYS Performed at [facility], [address] [place], [place] [address]    Report Status 07/16/2018 FINAL  Final    RADIOLOGY STUDIES/RESULTS: Dg Chest 2 View  Result Date: 07/16/2018 CLINICAL DATA:  Productive cough and shortness of breath with midline chest pain. Former smoker. Currently diagnosed with right lower lobe pneumonia. EXAM: CHEST - 2 VIEW COMPARISON:  Portable chest x-ray of July 14, 2018 FINDINGS: There remains mild volume loss on the right. There is a small right pleural effusion. The left lung is well-expanded. The interstitial markings of both lungs are coarse. The heart and pulmonary vascularity are normal. The mediastinum is normal in width. The bony thorax exhibits no acute abnormality. IMPRESSION: Persistent right lower lobe pneumonia. Small right pleural effusion. Underlying COPD. Electronically Signed   By: David  [place] M.D.   On: 07/16/2018 10:17   Dg Chest 2 View  Result Date: 07/11/2018 CLINICAL DATA:  Nausea vomiting and headache x1 week EXAM: CHEST - 2 VIEW COMPARISON:  08/22/2017 FINDINGS: Heart size is normal. There is mild aortic atherosclerosis without aneurysm. No acute pulmonary consolidation. Mild chronic interstitial prominence and bibasilar  atelectasis is identified. No overt pulmonary edema. ACDF of the lower cervical spine. No acute osseous abnormality. IMPRESSION: 1. Chronic mild interstitial lung disease and bibasilar atelectasis. 2. Minimal aortic atherosclerosis. 3. No acute pulmonary disease Electronically Signed   By: Tollie Eth M.D.   On: 07/11/2018 18:08   Ct Chest Wo Contrast  Result Date: 07/11/2018 CLINICAL DATA:  Nausea and vomiting for 1 week. Recurrent chest pain for 6 months. Shortness of breath and dizziness with exertion. Productive cough. EXAM: CT CHEST WITHOUT CONTRAST TECHNIQUE: Multidetector CT imaging of the chest was performed following the standard protocol without IV contrast. COMPARISON:  High-resolution chest CT 09/26/2017 FINDINGS: Cardiovascular: Normal heart size. Minimal pericardial effusion. Normal caliber thoracic aorta with scattered calcifications. Mediastinum/Nodes: Mild prominence of mediastinal lymph nodes with pretracheal nodes measuring up to about 10 mm short axis dimension. Similar appearance to previous study. Likely to be reactive. Esophagus is decompressed. Thyroid gland is unremarkable. Lungs/Pleura: Scattered areas of interstitial fibrosis in the lungs. Bronchial wall thickening suggesting chronic bronchitis. Bronchiectasis with mucous plugging in the lung bases. Patchy focal areas of consolidation in the lung bases, more prominent on the right. Consolidative changes are new since previous study suggesting interval development of an acute bronchopneumonia. No pleural effusions. No pneumothorax. Upper Abdomen: No acute changes identified. Vascular calcifications. Musculoskeletal: Degenerative changes in the thoracic spine. No destructive bone lesions. IMPRESSION: 1. Patchy areas of consolidation in the lung bases, more prominent on the right, suggesting interval development of acute bronchopneumonia. 2. Chronic bronchitic changes with bronchiectasis and mucous plugging in the lung bases. 3. Mild  mediastinal lymphadenopathy, likely reactive. 4. Minimal pericardial effusion. Aortic Atherosclerosis (ICD10-I70.0). Electronically Signed   By: Burman Nieves M.D.   On: 07/11/2018 23:55   Dg Chest Port 1v Same Day  Result Date: 07/14/2018 CLINICAL DATA:  Shortness of breath EXAM: PORTABLE CHEST 1 VIEW COMPARISON:  CT chest dated 07/11/2018 FINDINGS:  Patchy right lower lobe opacity, suspicious for pneumonia. Associated trace right pleural effusion. Bilateral lower lobe bronchiectasis. No pneumothorax. The heart is normal in size. Cervical spine fixation hardware. IMPRESSION: Patchy right lower lobe opacity, suspicious for pneumonia. Associated trace right pleural effusion. Electronically Signed   By: Charline Bills M.D.   On: 07/14/2018 08:20     LOS: 4 days   Jeoffrey Massed, MD  [facility]  If 7PM-7AM, please contact night-coverage  Please page via [email or web address]-Password [email or web address]-click on MD name and type text message  07/16/2018, 11:25 AM

## 2018-07-16 NOTE — Progress Notes (Signed)
Pharmacy Antibiotic Note  Desiree [name] is a 67 y.o. female admitted on 07/11/2018 with SOB and cough.  Pharmacy was consulted on 07/15/18 for vancomycin and cefepime dosing for PNA and needing more broad coverage. Scr 1.29 today, <1.25<1.33, est CrCl ~ 38.5 ml/min.   Plan: Adjust Cefepime to 2 g IV q24h.  Vancomycin 1000 mg IV q24h  Monitor renal function, clinical progress, LOT Vancomycin trough as clinically indicated   Height: 5' 2.01" (157.5 cm) Weight: 147 lb 11.3 oz (67 kg) IBW/kg (Calculated) : 50.12  Temp (24hrs), Avg:99.4 F (37.4 C), Min:98.9 F (37.2 C), Max:99.9 F (37.7 C)  Recent Labs  Lab 07/11/18 1732 07/11/18 2236 07/12/18 0255 07/13/18 1107 07/14/18 0256 07/15/18 0247 07/16/18 0329  WBC 27.6*  --  24.7* 25.4* 29.5* 31.0* 27.4*  CREATININE 1.32*  --   --   --  1.33* 1.25* 1.29*  LATICACIDVEN  --  1.11  --   --   --   --   --     Estimated Creatinine Clearance: 38.5 mL/min (A) (by C-G formula based on SCr of 1.29 mg/dL (H)).    Allergies  Allergen Reactions  . Prednisone Shortness Of Breath, Itching, Nausea And Vomiting and Palpitations  . Solu-Medrol [Methylprednisolone] Anaphylaxis  . Amoxicillin Hives and Rash    Has patient had a PCN reaction causing immediate rash, facial/tongue/throat swelling, SOB or lightheadedness with hypotension:Yes Has patient had a PCN reaction causing severe rash involving mucus membranes or skin necrosis: NO Has patient had a PCN reaction that required hospitalization NO Has patient had a PCN reaction occurring within the last 10 years: NO If all of the above answers are "NO", then may proceed with Cephalosporin use.   . Codeine Hives, Itching and Rash  . Hydrocodone-Acetaminophen Hives, Itching and Rash  . Penicillins Hives, Itching and Rash    ALLERGIC REACTION TO ORAL AMOXICILLIN Has patient had a PCN reaction causing immediate rash, facial/tongue/throat swelling, SOB or lightheadedness with hypotension: Yes Has  patient had a PCN reaction causing severe rash involving mucus membranes or skin necrosis: No Has patient had a PCN reaction that required hospitalization: No Has patient had a PCN reaction occurring within the last 10 years: No If all of the above answers are "NO", then may proceed with Cephalosporin use.    Antimicrobials this admission: Levaquin x1 9/19 azith 9/20 >> 9/23 Ceftriaxone 9/20 >> 9/23 vancomycin 9/23 >>  cefepime 9/23 >>   Dose adjustments this admission:   Microbiology results: 9/19 BCx: negative/final 9/19 UCx: insignificant growth Smith Mince   Thank you for allowing pharmacy to be a part of this patient's care.  Noah Delaine, RPh Clinical Pharmacist Pager: [phone] Clinical phone for 07/16/2018 until 3p is [phone] Please check AMION for all Pharmacist numbers by unit 07/16/2018 10:07 AM

## 2018-07-16 NOTE — Progress Notes (Signed)
NAME:  Desiree [name], MRN:  [id number], DOB:  16-Jan-1951, LOS: 4 ADMISSION DATE:  07/11/2018, CONSULTATION DATE:  07/15/2018 REFERRING MD:  Dr. Jerral Ralph, [facility], CHIEF COMPLAINT:  Short of breath   Brief History   67 yo female with sinus congestion/pressure, shortness of breath, fever, nausea with vomiting, and productive cough.  She has hx of COPD/emphysema and bronchiectasis.  Found to have increased Rt lung infiltrate.  Reports hx of allergy to prednisone and hydrocortisone.  Followed by Dr. Kendrick Fries in pulmonary office.  Past Medical History DM, HTN, Anxiety, Bipolar, CKD 3, Depression, GERD, Hiatal hernia, HLD  Significant Hospital Events   9/19 Admit  Consults: date of consult/date signed off & final recs:   Procedures (surgical and bedside):   Significant Diagnostic Tests:  RAST 09/24/17 >> negative, IgE 120 Serology 09/25/17 >> RF 127, anti CCP negative PFT 10/24/17 >> FEV1 1.36 (74%), FVC 1.79 (76%), TLC 6.19 (128%) DLCO 59%, small airways obstruction Quantiferon gold 11/15/17 >> negative CT chest 07/11/18 (reviewed by me) >> interstitial fibrosis, BTX with plugging at bases, patchy ASD Rt lower lung, bronchial thickening   Micro Data: Blood 9/19 >>  Antimicrobials:  Rocephin 9/19 >> 9/22 Levaquin 9/19 >> 9/19 Zithromax 9/20 >> 9/22  Vancomycin 9/23 >> Cefepime 9/23 >>    Subjective:  Sinus congestion better.  Still having rattle in chest, but easier to bring up phlegm.  Not having chest pain.  No fever.  Objective   Blood pressure (!) 146/77, pulse (!) 102, temperature 98.9 F (37.2 C), temperature source Oral, resp. rate 20, height 5' 2.01" (1.575 m), weight 67 kg, SpO2 92 %.        Intake/Output Summary (Last 24 hours) at 07/16/2018 1003 Last data filed at 07/16/2018 0400 Gross per 24 hour  Intake 910.94 ml  Output -  Net 910.94 ml   Filed Weights   07/12/18 0040 07/12/18 0739 07/16/18 9892  Weight: 67.7 kg 67.7 kg 67 kg    Examination:  General -  alert Eyes - pupils reactive ENT - no sinus tenderness, no stridor Cardiac - regular rate/rhythm, no murmur Chest - basilar rhonchi b/l, no wheeze Abdomen - soft, non tender Extremities - no edema Skin - no rashes Lymphatics - no lymphadenopathy Neuro - normal strength, moves extremities, follows commands Psych - normal mood and behavior  CXR 9/24 (reviewed by me) >> stable Rt lower lung ASD, and stable changes of BTX  Assessment & Plan:   Pneumonia in setting of bronchiectasis and COPD. - improving - continue IV Abx 9/24 >> if she continues to improve, then likely can change to PO ABx 9/25 - f/u CXR intermittently - continue mucomyst, pulmicort, duoneb, singulair, hypertonic saline - flutter valve, mucinex, chest vest  Acute sinusitis. - improving - continue ABx - flonase, singulair, nasal irrigation  Reported hx of allergy to prednisone and hydrocortisone. - she is not aware of having reaction to decadron or solumedrol   Disposition / Summary of Today's Plan 07/16/18   Continue IV ABx 9/24.  If stable, then might be able to change to PO abx 9/25 and d/c home shortly thereafter.    Diet: regular  DVT prophylaxis:lovenox GI prophylaxis: protonix Mobility: as tolerated Code Status: full code Family Communication: no family at bedside  Labs   CBC: Recent Labs  Lab 07/12/18 0255 07/13/18 1107 07/14/18 0256 07/15/18 0247 07/16/18 0329  WBC 24.7* 25.4* 29.5* 31.0* 27.4*  NEUTROABS  --   --   --  6.5 4.9  HGB 12.4 10.3* 11.0* 10.8* 11.4*  HCT 38.9 32.0* 34.2* 32.8* 35.7*  MCV 94.9 94.4 93.7 93.7 92.7  PLT 366 360 368 368 378   Basic Metabolic Panel: Recent Labs  Lab 07/11/18 1732 07/14/18 0256 07/15/18 0247 07/16/18 0329  NA 140 139 137 135  K 4.6 4.0 4.0 3.7  CL 101 105 102 101  CO2 25 23 25 24   GLUCOSE 77 124* 120* 97  BUN 12 11 8 9   CREATININE 1.32* 1.33* 1.25* 1.29*  CALCIUM 9.3 8.4* 8.3* 8.4*   GFR: Estimated Creatinine Clearance: 38.5 mL/min  (A) (by C-G formula based on SCr of 1.29 mg/dL (H)). Recent Labs  Lab 07/11/18 2236  07/13/18 1107 07/14/18 0256 07/15/18 0247 07/16/18 0329  WBC  --    < > 25.4* 29.5* 31.0* 27.4*  LATICACIDVEN 1.11  --   --   --   --   --    < > = values in this interval not displayed.   Liver Function Tests: Recent Labs  Lab 07/11/18 2219  AST 23  ALT 16  ALKPHOS 134*  BILITOT 0.9  PROT 8.0  ALBUMIN 3.1*   Recent Labs  Lab 07/11/18 2219  LIPASE 31   No results for input(s): AMMONIA in the last 168 hours. ABG    Component Value Date/Time   TCO2 25 08/31/2014 0104    Coagulation Profile: No results for input(s): INR, PROTIME in the last 168 hours. Cardiac Enzymes: No results for input(s): CKTOTAL, CKMB, CKMBINDEX, TROPONINI in the last 168 hours. HbA1C: Hemoglobin A1C  Date/Time Value Ref Range Status  12/26/2017 12:49 PM 5.9  Final  06/19/2017 04:18 PM 5.8  Final   Hgb A1c MFr Bld  Date/Time Value Ref Range Status  08/07/2017 03:27 PM 6.3 4.6 - 6.5 % Final    Comment:    Glycemic Control Guidelines for People with Diabetes:Non Diabetic:  <6%Goal of Therapy: <7%Additional Action Suggested:  >8%   09/25/2016 03:32 PM 6.0 (H) 4.8 - 5.6 % Final    Comment:    (NOTE)         Pre-diabetes: 5.7 - 6.4         Diabetes: >6.4         Glycemic control for adults with diabetes: <7.0    CBG: Recent Labs  Lab 07/12/18 0741 07/12/18 1137 07/12/18 1625 07/12/18 2124 07/13/18 0753  GLUCAP 107* 118* 108* 119* 94    07/14/18, MD [facility] 07/16/2018, 10:08 AM

## 2018-07-16 NOTE — Progress Notes (Signed)
Pt asleep at time of scheduled neb tx. RT will check back later.

## 2018-07-17 DIAGNOSIS — J479 Bronchiectasis, uncomplicated: Secondary | ICD-10-CM

## 2018-07-17 DIAGNOSIS — J441 Chronic obstructive pulmonary disease with (acute) exacerbation: Secondary | ICD-10-CM

## 2018-07-17 MED ORDER — SACCHAROMYCES BOULARDII 250 MG PO CAPS
250.0000 mg | ORAL_CAPSULE | Freq: Two times a day (BID) | ORAL | Status: DC
  Administered 2018-07-17 – 2018-07-20 (×5): 250 mg via ORAL
  Filled 2018-07-17 (×6): qty 1

## 2018-07-17 MED ORDER — SIMETHICONE 80 MG PO CHEW
160.0000 mg | CHEWABLE_TABLET | Freq: Once | ORAL | Status: AC
  Administered 2018-07-17: 160 mg via ORAL
  Filled 2018-07-17: qty 2

## 2018-07-17 MED ORDER — UMECLIDINIUM-VILANTEROL 62.5-25 MCG/INH IN AEPB
1.0000 | INHALATION_SPRAY | Freq: Every day | RESPIRATORY_TRACT | Status: DC
  Administered 2018-07-18 – 2018-07-20 (×3): 1 via RESPIRATORY_TRACT
  Filled 2018-07-17 (×3): qty 14

## 2018-07-17 MED ORDER — FAMOTIDINE 20 MG PO TABS
20.0000 mg | ORAL_TABLET | Freq: Two times a day (BID) | ORAL | Status: DC
  Administered 2018-07-17 – 2018-07-18 (×2): 20 mg via ORAL
  Filled 2018-07-17 (×2): qty 1

## 2018-07-17 MED ORDER — LEVOFLOXACIN 500 MG PO TABS
500.0000 mg | ORAL_TABLET | Freq: Every day | ORAL | Status: DC
  Administered 2018-07-18 – 2018-07-19 (×2): 500 mg via ORAL
  Filled 2018-07-17 (×2): qty 1

## 2018-07-17 NOTE — Plan of Care (Signed)
Discussed plan of care for the evening with patient.  Emphasized the importance of using the call button when assistance is needed.  Patient has pleasant and positive attitude.  She complained of bloating and gas.  Issue was relieved with medication.  Good teach back displayed.

## 2018-07-17 NOTE — Progress Notes (Addendum)
Nutrition Follow Up  DOCUMENTATION CODES:   Not applicable  INTERVENTION:    Continue Regular diet  NUTRITION DIAGNOSIS:   Inadequate oral intake related to decreased appetite, nausea, vomiting(COPD exacerbation) as evidenced by per patient/family report, meal completion < 50%, improved  GOAL:   Patient will meet greater than or equal to 90% of their needs, progressing  MONITOR:   PO intake, Labs, Skin, Weight trends, I & O's  ASSESSMENT:   Pt admitted with pneumonia, COPD exacerbation, and experienced N/V for the past week   RD spoke with patient at bedside. She is pleasant. States her appetite is good. Doesn't particularly like the food here. PO intake is variable at 25-75% per flowsheet records. Declined addition of nutrition supplements. Labs & medications reviewed.  Diet Order:   Diet Order            Diet regular Room service appropriate? Yes; Fluid consistency: Thin  Diet effective now             EDUCATION NEEDS:   No education needs have been identified at this time  Skin:  Skin Assessment: Reviewed RN Assessment  Last BM:  9/24  Height:   Ht Readings from Last 1 Encounters:  07/12/18 5' 2.01" (1.575 m)   Weight:   Wt Readings from Last 1 Encounters:  07/16/18 67 kg   Ideal Body Weight:  50 kg   BMI:  Body mass index is 27.01 kg/m.  Estimated Nutritional Needs:   Kcal:  1600-1800  Protein:  80-95 gm  Fluid:  1.6-1.8 L  Maureen Chatters, RD, LDN Pager #: [phone] After-Hours Pager #: [phone]

## 2018-07-17 NOTE — Progress Notes (Signed)
NAME:  Desiree [name], MRN:  [id number], DOB:  January 10, 1951, LOS: 5 ADMISSION DATE:  07/11/2018, CONSULTATION DATE:  07/15/2018 REFERRING MD:  Dr. Jerral Ralph, [facility], CHIEF COMPLAINT:  Short of breath   Brief History   67 yo female with sinus congestion/pressure, shortness of breath, fever, nausea with vomiting, and productive cough.  She has hx of COPD/emphysema and bronchiectasis.  Found to have increased Rt lung infiltrate.  Reports hx of allergy to prednisone and hydrocortisone.  Followed by Dr. Kendrick Fries in pulmonary office.  Past Medical History DM, HTN, Anxiety, Bipolar, CKD 3, Depression, GERD, Hiatal hernia, HLD  Significant Hospital Events   9/19 Admit  Consults: date of consult/date signed off & final recs:   Procedures (surgical and bedside):   Significant Diagnostic Tests:  RAST 09/24/17 >> negative, IgE 120 Serology 09/25/17 >> RF 127, anti CCP negative PFT 10/24/17 >> FEV1 1.36 (74%), FVC 1.79 (76%), TLC 6.19 (128%) DLCO 59%, small airways obstruction Quantiferon gold 11/15/17 >> negative CT chest 07/11/18 (reviewed by me) >> interstitial fibrosis, BTX with plugging at bases, patchy ASD Rt lower lung, bronchial thickening   Micro Data: Blood 9/19 >> negative  Antimicrobials:  Rocephin 9/19 >> 9/22 Levaquin 9/19 >> 9/19 Zithromax 9/20 >> 9/22  Vancomycin 9/23 >> 9/25  Cefepime 9/23 >> 9/25 Levaquin 9/25 >>  Subjective:  Breathing better.  Not as much cough or congestion.  Doesn't feel like nebulizer or chest vest are helping much anymore.  Objective   Blood pressure 139/71, pulse 94, temperature 99.9 F (37.7 C), temperature source Oral, resp. rate (!) 22, height 5' 2.01" (1.575 m), weight 67 kg, SpO2 96 %.       No intake or output data in the 24 hours ending 07/17/18 1430 Filed Weights   07/12/18 0739 07/16/18 4098 07/16/18 2350  Weight: 67.7 kg 67 kg 67 kg    Examination:  General - alert Eyes - pupils reactive ENT - no sinus tenderness, no stridor Cardiac -  regular rate/rhythm, no murmur Chest - equal breath sounds b/l, no wheezing or rales Abdomen - soft, non tender, + bowel sounds Extremities - no cyanosis, clubbing, or edema Skin - no rashes Lymphatics - no lymphadenopathy Neuro - CN intact, normal strength, moves extremities, follows commands Psych - normal mood and behavior     Assessment & Plan:   Pneumonia in setting of bronchiectasis and COPD. - change to levaquin 9/25 and completed 10 day course of ABx - resume anoro - d/c mucomyst, duoneb, chest vest, hypertonic saline nebulizer - prn flutter valve, IS - prn albuterol - f/u CXR as outpt  Acute sinusitis. - complete ABx - continue flonase, singulair, nasal irrigation  Reported hx of allergy to prednisone and hydrocortisone. - she is not aware of having reaction to decadron or solumedrol  Reflux. - continue protonix - prn maalox   Disposition / Summary of Today's Plan 07/17/18   Probably will be ready for d/c home in next 24 hours.    Diet: regular  DVT prophylaxis:lovenox GI prophylaxis: protonix Mobility: as tolerated Code Status: full code Family Communication: no family at bedside  Labs   CBC: Recent Labs  Lab 07/12/18 0255 07/13/18 1107 07/14/18 0256 07/15/18 0247 07/16/18 0329  WBC 24.7* 25.4* 29.5* 31.0* 27.4*  NEUTROABS  --   --   --  6.5 4.9  HGB 12.4 10.3* 11.0* 10.8* 11.4*  HCT 38.9 32.0* 34.2* 32.8* 35.7*  MCV 94.9 94.4 93.7 93.7 92.7  PLT 366 360 368 368  378   Basic Metabolic Panel: Recent Labs  Lab 07/11/18 1732 07/14/18 0256 07/15/18 0247 07/16/18 0329  NA 140 139 137 135  K 4.6 4.0 4.0 3.7  CL 101 105 102 101  CO2 25 23 25 24   GLUCOSE 77 124* 120* 97  BUN 12 11 8 9   CREATININE 1.32* 1.33* 1.25* 1.29*  CALCIUM 9.3 8.4* 8.3* 8.4*   GFR: Estimated Creatinine Clearance: 38.5 mL/min (A) (by C-G formula based on SCr of 1.29 mg/dL (H)). Recent Labs  Lab 07/11/18 2236  07/13/18 1107 07/14/18 0256 07/15/18 0247  07/16/18 0329  WBC  --    < > 25.4* 29.5* 31.0* 27.4*  LATICACIDVEN 1.11  --   --   --   --   --    < > = values in this interval not displayed.   Liver Function Tests: Recent Labs  Lab 07/11/18 2219  AST 23  ALT 16  ALKPHOS 134*  BILITOT 0.9  PROT 8.0  ALBUMIN 3.1*   Recent Labs  Lab 07/11/18 2219  LIPASE 31   No results for input(s): AMMONIA in the last 168 hours. ABG    Component Value Date/Time   TCO2 25 08/31/2014 0104    Coagulation Profile: No results for input(s): INR, PROTIME in the last 168 hours. Cardiac Enzymes: No results for input(s): CKTOTAL, CKMB, CKMBINDEX, TROPONINI in the last 168 hours. HbA1C: Hemoglobin A1C  Date/Time Value Ref Range Status  12/26/2017 12:49 PM 5.9  Final  06/19/2017 04:18 PM 5.8  Final   Hgb A1c MFr Bld  Date/Time Value Ref Range Status  08/07/2017 03:27 PM 6.3 4.6 - 6.5 % Final    Comment:    Glycemic Control Guidelines for People with Diabetes:Non Diabetic:  <6%Goal of Therapy: <7%Additional Action Suggested:  >8%   09/25/2016 03:32 PM 6.0 (H) 4.8 - 5.6 % Final    Comment:    (NOTE)         Pre-diabetes: 5.7 - 6.4         Diabetes: >6.4         Glycemic control for adults with diabetes: <7.0    CBG: Recent Labs  Lab 07/12/18 0741 07/12/18 1137 07/12/18 1625 07/12/18 2124 07/13/18 0753  GLUCAP 107* 118* 108* 119* 94    D/w Dr. 07/14/18.  07/15/18, MD [facility] 07/17/2018, 2:30 PM

## 2018-07-17 NOTE — Progress Notes (Signed)
PROGRESS NOTE        PATIENT DETAILS Name: Desiree [name] Age: 67 y.o. Sex: female Date of Birth: Dec 18, 1950 Admit Date: 07/11/2018 Admitting Physician Ejiroghene Wendall Stade, MD ZOX:WRUEAV, Neta Mends, MD  Brief Narrative: Patient is a 67 y.o. female with prior history of bronchiectasis, COPD, DM, hypertension resenting with worsening cough and increased sputum production-was gradually progressed to worsening shortness of breath over the past week or so, she presented to the emergency room where she was thought to have pneumonia along with COPD exacerbation.  She was subsequently admitted to the hospitalist service.  See below for further details.  Subjective: Feeling better.  Coughing better.  No nausea no vomiting.  Sleeping okay.  Tolerating oral diet.  Assessment/Plan: Community-acquired pneumonia: Somewhat improved-cough becoming more productive-and she is able to get sputum out after initiation of Mucomyst nebs along with incentive spirometry, chest PT/vibrator vest.  Due to worsening leukocytosis-she was switched to vancomycin and cefepime on 9/23 (bronchiectasis-so concern for drug-resistant organisms like MRSA and Pseudomonas) thankfully leukocytosis has started to trend down.  All cultures negative so far.  PCCM following-switch to oral Levaquin.  Labs tomorrow.  COPD/bronchiectasis exacerbation: Slow improvement continues-still wheezing-still not back to her baseline-unable to use steroids as she had an allergic reaction to Solu-Medrol post admission, she apparently has known allergic reaction to prednisone and hydrocortisone.  Currently being managed with scheduled bronchodilators, Mucinex, Mucomyst nebs and vibrator vest/chest PT.  Appreciate PCCM evaluation.    Hypertrophic cardiomyopathy: Echo in 2018 showed septal asymmetry, holding off on starting beta-blockers as she is still very rhonchorous/bronchospasm.  Once improvement occurs further-suspect we can use  a very selective beta-blocker closer to discharge.  Likely will start her on Lopressor tomorrow  Hypertension: Controlled-continue losartan  CKD stage III: Creatinine close to usual baseline-follow.  Dyslipidemia: Continue statin  GERD: Continue PPI  Bipolar disorder/anxiety: Stable-continue Cymbalta, Lamictal  DVT Prophylaxis: Prophylactic Lovenox   Code Status: Full code   Family Communication: None at bedside  Disposition Plan: If stable can like to be discharged home tomorrow.  Antimicrobial agents: Anti-infectives (From admission, onward)   Start     Dose/Rate Route Frequency Ordered Stop   07/18/18 1000  levofloxacin (LEVAQUIN) tablet 500 mg     500 mg Oral Daily 07/17/18 1350     07/17/18 1000  ceFEPIme (MAXIPIME) 2 g in sodium chloride 0.9 % 100 mL IVPB  Status:  Discontinued     2 g 200 mL/hr over 30 Minutes Intravenous Every 24 hours 07/16/18 1010 07/17/18 1326   07/16/18 1100  vancomycin (VANCOCIN) IVPB 1000 mg/200 mL premix  Status:  Discontinued     1,000 mg 200 mL/hr over 60 Minutes Intravenous Every 24 hours 07/15/18 1427 07/17/18 1326   07/15/18 1000  vancomycin (VANCOCIN) IVPB 1000 mg/200 mL premix     1,000 mg 200 mL/hr over 60 Minutes Intravenous  Once 07/15/18 0851 07/15/18 1231   07/15/18 0930  ceFEPIme (MAXIPIME) 2 g in sodium chloride 0.9 % 100 mL IVPB  Status:  Discontinued     2 g 200 mL/hr over 30 Minutes Intravenous Every 12 hours 07/15/18 0851 07/16/18 1010   07/13/18 2000  levofloxacin (LEVAQUIN) tablet 750 mg  Status:  Discontinued     750 mg Oral Every 48 hours 07/11/18 2240 07/12/18 0726   07/12/18 1600  azithromycin (ZITHROMAX) 500 mg  in sodium chloride 0.9 % 250 mL IVPB  Status:  Discontinued     500 mg 250 mL/hr over 60 Minutes Intravenous Every 24 hours 07/12/18 0056 07/15/18 0841   07/12/18 0100  cefTRIAXone (ROCEPHIN) 1 g in sodium chloride 0.9 % 100 mL IVPB  Status:  Discontinued     1 g 200 mL/hr over 30 Minutes Intravenous Every  24 hours 07/12/18 0056 07/15/18 0841   07/11/18 2145  levofloxacin (LEVAQUIN) IVPB 750 mg     750 mg 100 mL/hr over 90 Minutes Intravenous  Once 07/11/18 2132 07/12/18 0015      Procedures: None  CONSULTS:  None  Time spent: 25- minutes-Greater than 50% of this time was spent in counseling, explanation of diagnosis, planning of further management, and coordination of care.  MEDICATIONS: Scheduled Meds: . atorvastatin  20 mg Oral Daily  . DULoxetine  60 mg Oral Daily  . enoxaparin (LOVENOX) injection  40 mg Subcutaneous Q24H  . famotidine  20 mg Oral BID  . fluticasone  1-2 spray Each Nare QHS  . guaiFENesin  1,200 mg Oral BID  . lamoTRIgine  25 mg Oral Daily  . [START ON 07/18/2018] levofloxacin  500 mg Oral Daily  . losartan  50 mg Oral Daily  . montelukast  10 mg Oral QHS  . saccharomyces boulardii  250 mg Oral BID  . sodium chloride  2 spray Each Nare BID  . umeclidinium-vilanterol  1 puff Inhalation Daily   Continuous Infusions:  PRN Meds:.acetaminophen **OR** acetaminophen, albuterol, ondansetron **OR** ondansetron (ZOFRAN) IV   PHYSICAL EXAM: General appearance:Awake, alert, not in any distress.  Eyes:no scleral icterus. HEENT: Atraumatic and Normocephalic Neck: supple, no JVD. Resp:Good air entry bilaterally, rhonchi all over CVS: S1 S2 regular, no murmurs.  GI: Bowel sounds present, Non tender and not distended with no gaurding, rigidity or rebound. Extremities: B/L Lower Ext shows no edema, both legs are warm to touch Neurology:  Non focal Psychiatric: Normal judgment and insight. Normal mood. Musculoskeletal:No digital cyanosis Skin:No Rash, warm and dry Wounds:N/A   Vital signs: Vitals:   07/17/18 0855 07/17/18 0858 07/17/18 1413 07/17/18 1719  BP:    106/66  Pulse: 90  94 (!) 102  Resp: (!) 26  (!) 22   Temp:    99.5 F (37.5 C)  TempSrc:    Oral  SpO2: 96% 96% 96% 93%  Weight:      Height:       Filed Weights   07/12/18 0739 07/16/18 0632  07/16/18 2350  Weight: 67.7 kg 67 kg 67 kg   Body mass index is 27.01 kg/m.   General appearance:Awake, alert, not in any distress.  Eyes:no scleral icterus. HEENT: Atraumatic and Normocephalic Neck: supple, no JVD. Resp:Good air entry bilaterally, CVS: S1 S2 regular, no murmurs.  GI: Bowel sounds present, Non tender and not distended with no gaurding, rigidity or rebound. Extremities: B/L Lower Ext shows no edema, both legs are warm to touch Neurology:  Non focal Psychiatric: Normal judgment and insight. Normal mood. Musculoskeletal:No digital cyanosis Skin:No Rash, warm and dry Wounds:N/A  I have personally reviewed following labs and imaging studies  LABORATORY DATA: CBC: Recent Labs  Lab 07/12/18 0255 07/13/18 1107 07/14/18 0256 07/15/18 0247 07/16/18 0329  WBC 24.7* 25.4* 29.5* 31.0* 27.4*  NEUTROABS  --   --   --  6.5 4.9  HGB 12.4 10.3* 11.0* 10.8* 11.4*  HCT 38.9 32.0* 34.2* 32.8* 35.7*  MCV 94.9 94.4 93.7 93.7 92.7  PLT 366 360 368 368 378    Basic Metabolic Panel: Recent Labs  Lab 07/11/18 1732 07/14/18 0256 07/15/18 0247 07/16/18 0329  NA 140 139 137 135  K 4.6 4.0 4.0 3.7  CL 101 105 102 101  CO2 25 23 25 24   GLUCOSE 77 124* 120* 97  BUN 12 11 8 9   CREATININE 1.32* 1.33* 1.25* 1.29*  CALCIUM 9.3 8.4* 8.3* 8.4*    GFR: Estimated Creatinine Clearance: 38.5 mL/min (A) (by C-G formula based on SCr of 1.29 mg/dL (H)).  Liver Function Tests: Recent Labs  Lab 07/11/18 2219  AST 23  ALT 16  ALKPHOS 134*  BILITOT 0.9  PROT 8.0  ALBUMIN 3.1*   Recent Labs  Lab 07/11/18 2219  LIPASE 31   No results for input(s): AMMONIA in the last 168 hours.  Coagulation Profile: No results for input(s): INR, PROTIME in the last 168 hours.  Cardiac Enzymes: No results for input(s): CKTOTAL, CKMB, CKMBINDEX, TROPONINI in the last 168 hours.  BNP (last 3 results) No results for input(s): PROBNP in the last 8760 hours.  HbA1C: No results for  input(s): HGBA1C in the last 72 hours.  CBG: Recent Labs  Lab 07/12/18 0741 07/12/18 1137 07/12/18 1625 07/12/18 2124 07/13/18 0753  GLUCAP 107* 118* 108* 119* 94    Lipid Profile: No results for input(s): CHOL, HDL, LDLCALC, TRIG, CHOLHDL, LDLDIRECT in the last 72 hours.  Thyroid Function Tests: No results for input(s): TSH, T4TOTAL, FREET4, T3FREE, THYROIDAB in the last 72 hours.  Anemia Panel: No results for input(s): VITAMINB12, FOLATE, FERRITIN, TIBC, IRON, RETICCTPCT in the last 72 hours.  Urine analysis:    Component Value Date/Time   COLORURINE YELLOW 07/11/2018 0046   APPEARANCEUR HAZY (A) 07/11/2018 0046   LABSPEC 1.020 07/11/2018 0046   PHURINE 5.0 07/11/2018 0046   GLUCOSEU NEGATIVE 07/11/2018 0046   HGBUR NEGATIVE 07/11/2018 0046   HGBUR negative 03/11/2008 1317   BILIRUBINUR NEGATIVE 07/11/2018 0046   BILIRUBINUR n 08/07/2017 1558   KETONESUR NEGATIVE 07/11/2018 0046   PROTEINUR 100 (A) 07/11/2018 0046   UROBILINOGEN 1.0 08/07/2017 1558   UROBILINOGEN 0.2 08/16/2014 1513   NITRITE NEGATIVE 07/11/2018 0046   LEUKOCYTESUR MODERATE (A) 07/11/2018 0046    Sepsis Labs: Lactic Acid, Venous    Component Value Date/Time   LATICACIDVEN 1.11 07/11/2018 2236    MICROBIOLOGY: Recent Results (from the past 240 hour(s))  Urine culture     Status: Abnormal   Collection Time: 07/11/18  9:32 PM  Result Value Ref Range Status   Specimen Description URINE, RANDOM  Final   Special Requests NONE  Final   Culture (A)  Final    <10,000 COLONIES/mL INSIGNIFICANT GROWTH Performed at [facility], [address], [place], [place] [address]    Report Status 07/13/2018 FINAL  Final  Blood Culture (routine x 2)     Status: None   Collection Time: 07/11/18 10:19 PM  Result Value Ref Range Status   Specimen Description BLOOD RIGHT FOREARM  Final   Special Requests   Final    BOTTLES DRAWN AEROBIC AND ANAEROBIC Blood Culture adequate volume   Culture   Final     NO GROWTH 5 DAYS Performed at [facility], [address], [place], [place] [address]    Report Status 07/16/2018 FINAL  Final  Blood Culture (routine x 2)     Status: None   Collection Time: 07/11/18 10:19 PM  Result Value Ref Range Status  Specimen Description BLOOD LEFT ANTECUBITAL  Final   Special Requests   Final    BOTTLES DRAWN AEROBIC AND ANAEROBIC Blood Culture results may not be optimal due to an inadequate volume of blood received in culture bottles   Culture   Final    NO GROWTH 5 DAYS Performed at [facility], [address], [place], [place] [address]    Report Status 07/16/2018 FINAL  Final    RADIOLOGY STUDIES/RESULTS: Dg Chest 2 View  Result Date: 07/16/2018 CLINICAL DATA:  Productive cough and shortness of breath with midline chest pain. Former smoker. Currently diagnosed with right lower lobe pneumonia. EXAM: CHEST - 2 VIEW COMPARISON:  Portable chest x-ray of July 14, 2018 FINDINGS: There remains mild volume loss on the right. There is a small right pleural effusion. The left lung is well-expanded. The interstitial markings of both lungs are coarse. The heart and pulmonary vascularity are normal. The mediastinum is normal in width. The bony thorax exhibits no acute abnormality. IMPRESSION: Persistent right lower lobe pneumonia. Small right pleural effusion. Underlying COPD. Electronically Signed   By: David  [place] M.D.   On: 07/16/2018 10:17   Dg Chest 2 View  Result Date: 07/11/2018 CLINICAL DATA:  Nausea vomiting and headache x1 week EXAM: CHEST - 2 VIEW COMPARISON:  08/22/2017 FINDINGS: Heart size is normal. There is mild aortic atherosclerosis without aneurysm. No acute pulmonary consolidation. Mild chronic interstitial prominence and bibasilar atelectasis is identified. No overt pulmonary edema. ACDF of the lower cervical spine. No acute osseous abnormality. IMPRESSION: 1. Chronic mild interstitial lung disease and bibasilar atelectasis. 2.  Minimal aortic atherosclerosis. 3. No acute pulmonary disease Electronically Signed   By: Tollie Eth M.D.   On: 07/11/2018 18:08   Ct Chest Wo Contrast  Result Date: 07/11/2018 CLINICAL DATA:  Nausea and vomiting for 1 week. Recurrent chest pain for 6 months. Shortness of breath and dizziness with exertion. Productive cough. EXAM: CT CHEST WITHOUT CONTRAST TECHNIQUE: Multidetector CT imaging of the chest was performed following the standard protocol without IV contrast. COMPARISON:  High-resolution chest CT 09/26/2017 FINDINGS: Cardiovascular: Normal heart size. Minimal pericardial effusion. Normal caliber thoracic aorta with scattered calcifications. Mediastinum/Nodes: Mild prominence of mediastinal lymph nodes with pretracheal nodes measuring up to about 10 mm short axis dimension. Similar appearance to previous study. Likely to be reactive. Esophagus is decompressed. Thyroid gland is unremarkable. Lungs/Pleura: Scattered areas of interstitial fibrosis in the lungs. Bronchial wall thickening suggesting chronic bronchitis. Bronchiectasis with mucous plugging in the lung bases. Patchy focal areas of consolidation in the lung bases, more prominent on the right. Consolidative changes are new since previous study suggesting interval development of an acute bronchopneumonia. No pleural effusions. No pneumothorax. Upper Abdomen: No acute changes identified. Vascular calcifications. Musculoskeletal: Degenerative changes in the thoracic spine. No destructive bone lesions. IMPRESSION: 1. Patchy areas of consolidation in the lung bases, more prominent on the right, suggesting interval development of acute bronchopneumonia. 2. Chronic bronchitic changes with bronchiectasis and mucous plugging in the lung bases. 3. Mild mediastinal lymphadenopathy, likely reactive. 4. Minimal pericardial effusion. Aortic Atherosclerosis (ICD10-I70.0). Electronically Signed   By: Burman Nieves M.D.   On: 07/11/2018 23:55   Dg Chest  Port 1v Same Day  Result Date: 07/14/2018 CLINICAL DATA:  Shortness of breath EXAM: PORTABLE CHEST 1 VIEW COMPARISON:  CT chest dated 07/11/2018 FINDINGS: Patchy right lower lobe opacity, suspicious for pneumonia. Associated trace right pleural effusion. Bilateral lower lobe bronchiectasis. No pneumothorax. The heart is normal  in size. Cervical spine fixation hardware. IMPRESSION: Patchy right lower lobe opacity, suspicious for pneumonia. Associated trace right pleural effusion. Electronically Signed   By: Charline Bills M.D.   On: 07/14/2018 08:20     LOS: 5 days   Lynden Oxford, MD  [facility]  If 7PM-7AM, please contact night-coverage  Please page via [email or web address]-Password [email or web address]-click on MD name and type text message  07/17/2018, 7:13 PM

## 2018-07-17 NOTE — Progress Notes (Signed)
Pharmacy Antibiotic Note  Desiree [name] is a 67 y.o. female admitted on 07/11/2018 with SOB and cough.  Vancomycin and cefepime discontinued after doses given today. Transitioning to oral antibiotic. Pharmacy consulted to dose Levofloxacin for HCAP.   Scr 1.29 yesterday <1.25<1.33, est CrCl ~ 38.5 ml/min.  Tm 99.9   Plan: Levofloxacin 500 mg po once daily, start tomorrow AM.  Height: 5' 2.01" (157.5 cm) Weight: 147 lb 11.3 oz (67 kg) IBW/kg (Calculated) : 50.12  Temp (24hrs), Avg:99.3 F (37.4 C), Min:98.7 F (37.1 C), Max:99.9 F (37.7 C)  Recent Labs  Lab 07/11/18 1732 07/11/18 2236 07/12/18 0255 07/13/18 1107 07/14/18 0256 07/15/18 0247 07/16/18 0329  WBC 27.6*  --  24.7* 25.4* 29.5* 31.0* 27.4*  CREATININE 1.32*  --   --   --  1.33* 1.25* 1.29*  LATICACIDVEN  --  1.11  --   --   --   --   --     Estimated Creatinine Clearance: 38.5 mL/min (A) (by C-G formula based on SCr of 1.29 mg/dL (H)).    Allergies  Allergen Reactions  . Prednisone Shortness Of Breath, Itching, Nausea And Vomiting and Palpitations  . Solu-Medrol [Methylprednisolone] Anaphylaxis  . Amoxicillin Hives and Rash    Has patient had a PCN reaction causing immediate rash, facial/tongue/throat swelling, SOB or lightheadedness with hypotension:Yes Has patient had a PCN reaction causing severe rash involving mucus membranes or skin necrosis: NO Has patient had a PCN reaction that required hospitalization NO Has patient had a PCN reaction occurring within the last 10 years: NO If all of the above answers are "NO", then may proceed with Cephalosporin use.   . Codeine Hives, Itching and Rash  . Hydrocodone-Acetaminophen Hives, Itching and Rash  . Penicillins Hives, Itching and Rash    ALLERGIC REACTION TO ORAL AMOXICILLIN Has patient had a PCN reaction causing immediate rash, facial/tongue/throat swelling, SOB or lightheadedness with hypotension: Yes Has patient had a PCN reaction causing severe rash  involving mucus membranes or skin necrosis: No Has patient had a PCN reaction that required hospitalization: No Has patient had a PCN reaction occurring within the last 10 years: No If all of the above answers are "NO", then may proceed with Cephalosporin use.    Antimicrobials this admission: Levaquin x1 9/19 azith 9/20 >> 9/23 Ceftriaxone 9/20 >> 9/23 vancomycin 9/23 >> 9/25 cefepime 9/23 >> 9/25 Levaquin 9/26>>  Dose adjustments this admission:   Microbiology results: 9/19 BCx: negative/final 9/19 UCx: insignificant growth Smith Mince   Thank you for allowing pharmacy to be a part of this patient's care.  Noah Delaine, RPh Clinical Pharmacist Pager: [phone] Clinical phone for 07/17/2018 until 3p is [phone] Please check AMION for all Pharmacist numbers by unit 07/17/2018 1:50 PM

## 2018-07-18 ENCOUNTER — Inpatient Hospital Stay (HOSPITAL_COMMUNITY): Payer: Medicare HMO

## 2018-07-18 ENCOUNTER — Other Ambulatory Visit: Payer: Self-pay

## 2018-07-18 DIAGNOSIS — D721 Eosinophilia: Secondary | ICD-10-CM

## 2018-07-18 LAB — COMPREHENSIVE METABOLIC PANEL
ALT: 20 U/L (ref 0–44)
AST: 29 U/L (ref 15–41)
Albumin: 2.4 g/dL — ABNORMAL LOW (ref 3.5–5.0)
Alkaline Phosphatase: 88 U/L (ref 38–126)
Anion gap: 6 (ref 5–15)
BUN: 6 mg/dL — ABNORMAL LOW (ref 8–23)
CO2: 27 mmol/L (ref 22–32)
Calcium: 8.4 mg/dL — ABNORMAL LOW (ref 8.9–10.3)
Chloride: 103 mmol/L (ref 98–111)
Creatinine, Ser: 1.32 mg/dL — ABNORMAL HIGH (ref 0.44–1.00)
GFR calc Af Amer: 48 mL/min — ABNORMAL LOW (ref >60)
GFR calc non Af Amer: 41 mL/min — ABNORMAL LOW (ref >60)
Glucose, Bld: 98 mg/dL (ref 70–99)
Potassium: 3.7 mmol/L (ref 3.5–5.1)
Sodium: 136 mmol/L (ref 135–145)
Total Bilirubin: 0.5 mg/dL (ref 0.3–1.2)
Total Protein: 6.3 g/dL — ABNORMAL LOW (ref 6.5–8.1)

## 2018-07-18 LAB — CBC WITH DIFFERENTIAL/PLATELET
Basophils Absolute: 0 10*3/uL (ref 0.0–0.1)
Basophils Relative: 0 %
Eosinophils Absolute: 22.1 10*3/uL — ABNORMAL HIGH (ref 0.0–0.7)
Eosinophils Relative: 73 %
HCT: 32.8 % — ABNORMAL LOW (ref 36.0–46.0)
Hemoglobin: 10.7 g/dL — ABNORMAL LOW (ref 12.0–15.0)
Lymphocytes Relative: 9 %
Lymphs Abs: 2.7 10*3/uL (ref 0.7–4.0)
MCH: 30.5 pg (ref 26.0–34.0)
MCHC: 32.6 g/dL (ref 30.0–36.0)
MCV: 93.4 fL (ref 78.0–100.0)
Monocytes Absolute: 0.9 10*3/uL (ref 0.1–1.0)
Monocytes Relative: 3 %
Neutro Abs: 4.5 10*3/uL (ref 1.7–7.7)
Neutrophils Relative %: 15 %
Platelets: 356 10*3/uL (ref 150–400)
RBC: 3.51 MIL/uL — ABNORMAL LOW (ref 3.87–5.11)
RDW: 14 % (ref 11.5–15.5)
Smear Review: ADEQUATE
WBC: 30.2 10*3/uL — ABNORMAL HIGH (ref 4.0–10.5)

## 2018-07-18 LAB — MAGNESIUM: Magnesium: 1.8 mg/dL (ref 1.7–2.4)

## 2018-07-18 LAB — LIPASE, BLOOD: Lipase: 37 U/L (ref 11–51)

## 2018-07-18 LAB — C-REACTIVE PROTEIN: CRP: 4.9 mg/dL — ABNORMAL HIGH (ref <1.0)

## 2018-07-18 LAB — LACTIC ACID, PLASMA: Lactic Acid, Venous: 1 mmol/L (ref 0.5–1.9)

## 2018-07-18 LAB — SEDIMENTATION RATE: Sed Rate: 61 mm/hr — ABNORMAL HIGH (ref 0–22)

## 2018-07-18 MED ORDER — PANTOPRAZOLE SODIUM 40 MG PO TBEC
40.0000 mg | DELAYED_RELEASE_TABLET | Freq: Two times a day (BID) | ORAL | Status: DC
  Administered 2018-07-18 – 2018-07-20 (×5): 40 mg via ORAL
  Filled 2018-07-18 (×5): qty 1

## 2018-07-18 MED ORDER — PHENAZOPYRIDINE HCL 200 MG PO TABS
200.0000 mg | ORAL_TABLET | Freq: Two times a day (BID) | ORAL | Status: DC
  Administered 2018-07-18 – 2018-07-20 (×5): 200 mg via ORAL
  Filled 2018-07-18 (×7): qty 1

## 2018-07-18 NOTE — Progress Notes (Signed)
PROGRESS NOTE        PATIENT DETAILS Name: NELLIE PESTER Age: 67 y.o. Sex: female Date of Birth: 27-Jul-1951 Admit Date: 07/11/2018 Admitting Physician Ejiroghene Wendall Stade, MD [id number], Neta Mends, MD  Brief Narrative: Patient is a 67 y.o. female with prior history of bronchiectasis, COPD, DM, hypertension resenting with worsening cough and increased sputum production-was gradually progressed to worsening shortness of breath over the past week or so, she presented to the emergency room where she was thought to have pneumonia along with COPD exacerbation.  She was subsequently admitted to the hospitalist service.  See below for further details.  Subjective: Reports nausea and severe abdominal pain, also acid reflux  Assessment/Plan: Community-acquired pneumonia: Somewhat improved-cough becoming more productive-and she is able to get sputum out after initiation of Mucomyst nebs along with incentive spirometry, chest PT/vibrator vest.  Due to worsening leukocytosis-she was switched to vancomycin and cefepime on 9/23 (bronchiectasis-so concern for drug-resistant organisms like MRSA and Pseudomonas) thankfully leukocytosis has started to trend down.  All cultures negative so far.  PCCM following-switch to oral Levaquin.  COPD/bronchiectasis exacerbation: Slow improvement continues-still wheezing-still not back to her baseline-unable to use steroids as she had an allergic reaction to Solu-Medrol post admission, she apparently has known allergic reaction to prednisone and hydrocortisone.  Currently being managed with scheduled bronchodilators, Mucinex, Mucomyst nebs and vibrator vest/chest PT.  Appreciate PCCM evaluation.    Hypertrophic cardiomyopathy: Echo in 2018 showed septal asymmetry, holding off on starting beta-blockers as she is still very rhonchorous/bronchospasm.  Once improvement occurs further-suspect we can use a very selective beta-blocker closer to discharge.   Likely will start her on Lopressor tomorrow  Hypertension: Controlled-continue losartan  CKD stage III: Creatinine close to usual baseline-follow.  Dyslipidemia: Continue statin  GERD: Continue PPI  Bipolar disorder/anxiety: Stable-continue Cymbalta, Lamictal  Eosinophilia  D/w PCCM recommended no further work up.  D/w hematology recommended outpatient follow up in 1 month  DVT Prophylaxis: Prophylactic Lovenox   Code Status: Full code   Family Communication: None at bedside  Disposition Plan: If stable can like to be discharged home tomorrow.  Antimicrobial agents: Anti-infectives (From admission, onward)   Start     Dose/Rate Route Frequency Ordered Stop   07/18/18 1000  levofloxacin (LEVAQUIN) tablet 500 mg     500 mg Oral Daily 07/17/18 1350     07/17/18 1000  ceFEPIme (MAXIPIME) 2 g in sodium chloride 0.9 % 100 mL IVPB  Status:  Discontinued     2 g 200 mL/hr over 30 Minutes Intravenous Every 24 hours 07/16/18 1010 07/17/18 1326   07/16/18 1100  vancomycin (VANCOCIN) IVPB 1000 mg/200 mL premix  Status:  Discontinued     1,000 mg 200 mL/hr over 60 Minutes Intravenous Every 24 hours 07/15/18 1427 07/17/18 1326   07/15/18 1000  vancomycin (VANCOCIN) IVPB 1000 mg/200 mL premix     1,000 mg 200 mL/hr over 60 Minutes Intravenous  Once 07/15/18 0851 07/15/18 1231   07/15/18 0930  ceFEPIme (MAXIPIME) 2 g in sodium chloride 0.9 % 100 mL IVPB  Status:  Discontinued     2 g 200 mL/hr over 30 Minutes Intravenous Every 12 hours 07/15/18 0851 07/16/18 1010   07/13/18 2000  levofloxacin (LEVAQUIN) tablet 750 mg  Status:  Discontinued     750 mg Oral Every 48 hours 07/11/18 2240 07/12/18 0726  07/12/18 1600  azithromycin (ZITHROMAX) 500 mg in sodium chloride 0.9 % 250 mL IVPB  Status:  Discontinued     500 mg 250 mL/hr over 60 Minutes Intravenous Every 24 hours 07/12/18 0056 07/15/18 0841   07/12/18 0100  cefTRIAXone (ROCEPHIN) 1 g in sodium chloride 0.9 % 100 mL IVPB  Status:   Discontinued     1 g 200 mL/hr over 30 Minutes Intravenous Every 24 hours 07/12/18 0056 07/15/18 0841   07/11/18 2145  levofloxacin (LEVAQUIN) IVPB 750 mg     750 mg 100 mL/hr over 90 Minutes Intravenous  Once 07/11/18 2132 07/12/18 0015      Procedures: None  CONSULTS:  None  Time spent: 25- minutes-Greater than 50% of this time was spent in counseling, explanation of diagnosis, planning of further management, and coordination of care.  MEDICATIONS: Scheduled Meds: . atorvastatin  20 mg Oral Daily  . DULoxetine  60 mg Oral Daily  . enoxaparin (LOVENOX) injection  40 mg Subcutaneous Q24H  . fluticasone  1-2 spray Each Nare QHS  . guaiFENesin  1,200 mg Oral BID  . lamoTRIgine  25 mg Oral Daily  . levofloxacin  500 mg Oral Daily  . losartan  50 mg Oral Daily  . montelukast  10 mg Oral QHS  . pantoprazole  40 mg Oral BID AC  . phenazopyridine  200 mg Oral BID WC  . saccharomyces boulardii  250 mg Oral BID  . sodium chloride  2 spray Each Nare BID  . umeclidinium-vilanterol  1 puff Inhalation Daily   Continuous Infusions:  PRN Meds:.acetaminophen **OR** acetaminophen, albuterol, ondansetron **OR** ondansetron (ZOFRAN) IV   PHYSICAL EXAM: General appearance:Awake, alert, not in any distress.  Eyes:no scleral icterus. HEENT: Atraumatic and Normocephalic Neck: supple, no JVD. Resp:Good air entry bilaterally, rhonchi all over CVS: S1 S2 regular, no murmurs.  GI: Bowel sounds present, Non tender and not distended with no gaurding, rigidity or rebound. Extremities: B/L Lower Ext shows no edema, both legs are warm to touch Neurology:  Non focal Psychiatric: Normal judgment and insight. Normal mood. Musculoskeletal:No digital cyanosis Skin:No Rash, warm and dry Wounds:N/A   Vital signs: Vitals:   07/17/18 2344 07/18/18 1100 07/18/18 1229 07/18/18 1619  BP: 134/79   125/73  Pulse: (!) 103 (!) 111 (!) 104 99  Resp: 17 (!) 22 20 18   Temp: 100.1 F (37.8 C)   98.7 F  (37.1 C)  TempSrc: Oral   Oral  SpO2: 95% 93% 94% 100%  Weight:      Height:       Filed Weights   07/12/18 0739 07/16/18 0632 07/16/18 2350  Weight: 67.7 kg 67 kg 67 kg   Body mass index is 27.01 kg/m.   General appearance:Awake, alert, not in any distress.  Eyes:no scleral icterus. HEENT: Atraumatic and Normocephalic Neck: supple, no JVD. Resp:Good air entry bilaterally, CVS: S1 S2 regular, no murmurs.  GI: Bowel sounds present, Non tender and not distended with no gaurding, rigidity or rebound. Extremities: B/L Lower Ext shows no edema, both legs are warm to touch Neurology:  Non focal Psychiatric: Normal judgment and insight. Normal mood. Musculoskeletal:No digital cyanosis Skin:No Rash, warm and dry Wounds:N/A  I have personally reviewed following labs and imaging studies  LABORATORY DATA: CBC: Recent Labs  Lab 07/13/18 1107 07/14/18 0256 07/15/18 0247 07/16/18 0329 07/18/18 0341  WBC 25.4* 29.5* 31.0* 27.4* 30.2*  NEUTROABS  --   --  6.5 4.9 4.5  HGB 10.3* 11.0* 10.8* 11.4*  10.7*  HCT 32.0* 34.2* 32.8* 35.7* 32.8*  MCV 94.4 93.7 93.7 92.7 93.4  PLT 360 368 368 378 356    Basic Metabolic Panel: Recent Labs  Lab 07/14/18 0256 07/15/18 0247 07/16/18 0329 07/18/18 0341  NA 139 137 135 136  K 4.0 4.0 3.7 3.7  CL 105 102 101 103  CO2 23 25 24 27   GLUCOSE 124* 120* 97 98  BUN 11 8 9  6*  CREATININE 1.33* 1.25* 1.29* 1.32*  CALCIUM 8.4* 8.3* 8.4* 8.4*  MG  --   --   --  1.8    GFR: Estimated Creatinine Clearance: 37.7 mL/min (A) (by C-G formula based on SCr of 1.32 mg/dL (H)).  Liver Function Tests: Recent Labs  Lab 07/11/18 2219 07/18/18 0341  AST 23 29  ALT 16 20  ALKPHOS 134* 88  BILITOT 0.9 0.5  PROT 8.0 6.3*  ALBUMIN 3.1* 2.4*   Recent Labs  Lab 07/11/18 2219 07/18/18 0859  LIPASE 31 37   No results for input(s): AMMONIA in the last 168 hours.  Coagulation Profile: No results for input(s): INR, PROTIME in the last 168  hours.  Cardiac Enzymes: No results for input(s): CKTOTAL, CKMB, CKMBINDEX, TROPONINI in the last 168 hours.  BNP (last 3 results) No results for input(s): PROBNP in the last 8760 hours.  HbA1C: No results for input(s): HGBA1C in the last 72 hours.  CBG: Recent Labs  Lab 07/12/18 0741 07/12/18 1137 07/12/18 1625 07/12/18 2124 07/13/18 0753  GLUCAP 107* 118* 108* 119* 94    Lipid Profile: No results for input(s): CHOL, HDL, LDLCALC, TRIG, CHOLHDL, LDLDIRECT in the last 72 hours.  Thyroid Function Tests: No results for input(s): TSH, T4TOTAL, FREET4, T3FREE, THYROIDAB in the last 72 hours.  Anemia Panel: No results for input(s): VITAMINB12, FOLATE, FERRITIN, TIBC, IRON, RETICCTPCT in the last 72 hours.  Urine analysis:    Component Value Date/Time   COLORURINE YELLOW 07/11/2018 0046   APPEARANCEUR HAZY (A) 07/11/2018 0046   LABSPEC 1.020 07/11/2018 0046   PHURINE 5.0 07/11/2018 0046   GLUCOSEU NEGATIVE 07/11/2018 0046   HGBUR NEGATIVE 07/11/2018 0046   HGBUR negative 03/11/2008 1317   BILIRUBINUR NEGATIVE 07/11/2018 0046   BILIRUBINUR n 08/07/2017 1558   KETONESUR NEGATIVE 07/11/2018 0046   PROTEINUR 100 (A) 07/11/2018 0046   UROBILINOGEN 1.0 08/07/2017 1558   UROBILINOGEN 0.2 08/16/2014 1513   NITRITE NEGATIVE 07/11/2018 0046   LEUKOCYTESUR MODERATE (A) 07/11/2018 0046    Sepsis Labs: Lactic Acid, Venous    Component Value Date/Time   LATICACIDVEN 1.0 07/18/2018 0859    MICROBIOLOGY: Recent Results (from the past 240 hour(s))  Urine culture     Status: Abnormal   Collection Time: 07/11/18  9:32 PM  Result Value Ref Range Status   Specimen Description URINE, RANDOM  Final   Special Requests NONE  Final   Culture (A)  Final    <10,000 COLONIES/mL INSIGNIFICANT GROWTH Performed at [facility], [address] [place], [address] [place]    Report Status 07/13/2018 FINAL  Final  Blood Culture (routine x 2)     Status: None   Collection Time:  07/11/18 10:19 PM  Result Value Ref Range Status   Specimen Description BLOOD RIGHT FOREARM  Final   Special Requests   Final    BOTTLES DRAWN AEROBIC AND ANAEROBIC Blood Culture adequate volume   Culture   Final    NO GROWTH 5 DAYS Performed at [facility], [address], [place], [place] [address]    Report Status 07/16/2018 FINAL  Final  Blood Culture (routine x 2)     Status: None   Collection Time: 07/11/18 10:19 PM  Result Value Ref Range Status   Specimen Description BLOOD LEFT ANTECUBITAL  Final   Special Requests   Final    BOTTLES DRAWN AEROBIC AND ANAEROBIC Blood Culture results may not be optimal due to an inadequate volume of blood received in culture bottles   Culture   Final    NO GROWTH 5 DAYS Performed at [facility], [address] [place], [place] [address]    Report Status 07/16/2018 FINAL  Final    RADIOLOGY STUDIES/RESULTS: Ct Abdomen Pelvis Wo Contrast  Result Date: 07/18/2018 CLINICAL DATA:  Upper abdominal epigastric pain, vomiting, history of stage III chronic kidney disease, COPD, diabetes mellitus, hypertension, former smoker EXAM: CT ABDOMEN AND PELVIS WITHOUT CONTRAST TECHNIQUE: Multidetector CT imaging of the abdomen and pelvis was performed following the standard protocol without IV contrast. Sagittal and coronal MPR images reconstructed from axial data set. Patient drank dilute oral contrast for exam. COMPARISON:  01/03/2017 FINDINGS: Lower chest: Small RIGHT pleural effusion. Consolidation RIGHT lower lobe. Minimal LEFT basilar atelectasis. Hepatobiliary: Gallbladder and liver normal appearance Pancreas: Normal appearance Spleen: Normal appearance Adrenals/Urinary Tract: Adrenal glands unremarkable. Nonobstructing calculus mid inferior LEFT kidney. No definite renal mass or hydronephrosis. No ureteral calcification or dilatation. Bladder unremarkable. Stomach/Bowel: Normal appendix. Stomach and bowel loops normal appearance  Vascular/Lymphatic: Atherosclerotic calcifications aorta and iliac arteries without aneurysm. Scattered normal size retroperitoneal lymph nodes. No definite abdominal or pelvic adenopathy. Reproductive: Unremarkable uterus and ovaries Other: No free air or free fluid. Minimal edema within tissue planes in the pelvis versus previous exam though no specific/localized acute inflammatory process is identified. Musculoskeletal: Osseous demineralization. Lumbar fusion L4-L5 with minimal anterolisthesis. Facet degenerative changes lower lumbar spine. No acute bony findings. IMPRESSION: Small RIGHT pleural effusion with mild consolidation in RIGHT lower lobe. Minimal edema of soft tissue planes in pelvis though no specific acute inflammatory processes localized. Small nonobstructing LEFT renal calculus. Remainder of exam unremarkable. Electronically Signed   By: Ulyses Southward M.D.   On: 07/18/2018 14:21   Dg Chest 2 View  Result Date: 07/16/2018 CLINICAL DATA:  Productive cough and shortness of breath with midline chest pain. Former smoker. Currently diagnosed with right lower lobe pneumonia. EXAM: CHEST - 2 VIEW COMPARISON:  Portable chest x-ray of July 14, 2018 FINDINGS: There remains mild volume loss on the right. There is a small right pleural effusion. The left lung is well-expanded. The interstitial markings of both lungs are coarse. The heart and pulmonary vascularity are normal. The mediastinum is normal in width. The bony thorax exhibits no acute abnormality. IMPRESSION: Persistent right lower lobe pneumonia. Small right pleural effusion. Underlying COPD. Electronically Signed   By: David  [place] M.D.   On: 07/16/2018 10:17   Dg Chest 2 View  Result Date: 07/11/2018 CLINICAL DATA:  Nausea vomiting and headache x1 week EXAM: CHEST - 2 VIEW COMPARISON:  08/22/2017 FINDINGS: Heart size is normal. There is mild aortic atherosclerosis without aneurysm. No acute pulmonary consolidation. Mild chronic  interstitial prominence and bibasilar atelectasis is identified. No overt pulmonary edema. ACDF of the lower cervical spine. No acute osseous abnormality. IMPRESSION: 1. Chronic mild interstitial lung disease and bibasilar atelectasis. 2. Minimal aortic atherosclerosis. 3. No acute pulmonary disease Electronically Signed   By: Tollie Eth M.D.   On: 07/11/2018 18:08  Ct Chest Wo Contrast  Result Date: 07/11/2018 CLINICAL DATA:  Nausea and vomiting for 1 week. Recurrent chest pain for 6 months. Shortness of breath and dizziness with exertion. Productive cough. EXAM: CT CHEST WITHOUT CONTRAST TECHNIQUE: Multidetector CT imaging of the chest was performed following the standard protocol without IV contrast. COMPARISON:  High-resolution chest CT 09/26/2017 FINDINGS: Cardiovascular: Normal heart size. Minimal pericardial effusion. Normal caliber thoracic aorta with scattered calcifications. Mediastinum/Nodes: Mild prominence of mediastinal lymph nodes with pretracheal nodes measuring up to about 10 mm short axis dimension. Similar appearance to previous study. Likely to be reactive. Esophagus is decompressed. Thyroid gland is unremarkable. Lungs/Pleura: Scattered areas of interstitial fibrosis in the lungs. Bronchial wall thickening suggesting chronic bronchitis. Bronchiectasis with mucous plugging in the lung bases. Patchy focal areas of consolidation in the lung bases, more prominent on the right. Consolidative changes are new since previous study suggesting interval development of an acute bronchopneumonia. No pleural effusions. No pneumothorax. Upper Abdomen: No acute changes identified. Vascular calcifications. Musculoskeletal: Degenerative changes in the thoracic spine. No destructive bone lesions. IMPRESSION: 1. Patchy areas of consolidation in the lung bases, more prominent on the right, suggesting interval development of acute bronchopneumonia. 2. Chronic bronchitic changes with bronchiectasis and mucous  plugging in the lung bases. 3. Mild mediastinal lymphadenopathy, likely reactive. 4. Minimal pericardial effusion. Aortic Atherosclerosis (ICD10-I70.0). Electronically Signed   By: Burman Nieves M.D.   On: 07/11/2018 23:55   Dg Chest Port 1v Same Day  Result Date: 07/14/2018 CLINICAL DATA:  Shortness of breath EXAM: PORTABLE CHEST 1 VIEW COMPARISON:  CT chest dated 07/11/2018 FINDINGS: Patchy right lower lobe opacity, suspicious for pneumonia. Associated trace right pleural effusion. Bilateral lower lobe bronchiectasis. No pneumothorax. The heart is normal in size. Cervical spine fixation hardware. IMPRESSION: Patchy right lower lobe opacity, suspicious for pneumonia. Associated trace right pleural effusion. Electronically Signed   By: Charline Bills M.D.   On: 07/14/2018 08:20     LOS: 6 days   Lynden Oxford, MD  [facility]  If 7PM-7AM, please contact night-coverage  Please page via [email or web address]-click on MD name and type text message  07/18/2018, 6:42 PM
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
## 2018-07-18 NOTE — Progress Notes (Signed)
NAME:  ZILDA NO, MRN:  [id number], DOB:  07-02-1951, LOS: 6 ADMISSION DATE:  07/11/2018, CONSULTATION DATE:  07/15/2018 REFERRING MD:  Dr. Jerral Ralph, [facility], CHIEF COMPLAINT:  Short of breath   Brief History   67 yo female with sinus congestion/pressure, shortness of breath, fever, nausea with vomiting, and productive cough.  She has hx of COPD/emphysema and bronchiectasis.  Found to have increased Rt lung infiltrate.  Reports hx of allergy to prednisone and hydrocortisone.  Followed by Dr. Kendrick Fries in pulmonary office.  Past Medical History DM, HTN, Anxiety, Bipolar, CKD 3, Depression, GERD, Hiatal hernia, HLD  Significant Hospital Events   9/19 Admit  Consults: date of consult/date signed off & final recs:   Procedures (surgical and bedside):   Significant Diagnostic Tests:  RAST 09/24/17 >> negative, IgE 120 Serology 09/25/17 >> RF 127, anti CCP negative PFT 10/24/17 >> FEV1 1.36 (74%), FVC 1.79 (76%), TLC 6.19 (128%) DLCO 59%, small airways obstruction Quantiferon gold 11/15/17 >> negative CT chest 07/11/18 (reviewed by me) >> interstitial fibrosis, BTX with plugging at bases, patchy ASD Rt lower lung, bronchial thickening   Micro Data: Blood 9/19 >> negative  Antimicrobials:  Rocephin 9/19 >> 9/22 Levaquin 9/19 >> 9/19 Zithromax 9/20 >> 9/22  Vancomycin 9/23 >> 9/25  Cefepime 9/23 >> 9/25 Levaquin 9/25 >>  Subjective:  Breathing better.  Not having as much cough.  Denies sinus congestion.  Still has reflux.  Objective   Blood pressure 134/79, pulse (!) 104, temperature 100.1 F (37.8 C), temperature source Oral, resp. rate 20, height 5' 2.01" (1.575 m), weight 67 kg, SpO2 94 %.        Intake/Output Summary (Last 24 hours) at 07/18/2018 1526 Last data filed at 07/18/2018 1009 Gross per 24 hour  Intake 600 ml  Output -  Net 600 ml   Filed Weights   07/12/18 0739 07/16/18 6767 07/16/18 2350  Weight: 67.7 kg 67 kg 67 kg    Examination:  General - alert Eyes -  pupils reactive ENT - no sinus tenderness, no stridor Cardiac - regular rate/rhythm, no murmur Chest - equal breath sounds b/l, no wheezing or rales Abdomen - soft, non tender, + bowel sounds Extremities - no cyanosis, clubbing, or edema Skin - no rashes Lymphatics - no lymphadenopathy Neuro - normal strength, moves extremities, follows commands Psych - normal mood and behavior    Assessment & Plan:   Pneumonia in setting of bronchiectasis and COPD. - day 8/10 of Abx, currently on levaquin - continue anoro, singulair - prn albuterol  Acute sinusitis. - continue Abx - continue flonase, singulair, nasal irrigation - complete ABx - continue flonase, singulair, nasal irrigation  Reported hx of allergy to prednisone and hydrocortisone. - she is not aware of having reaction to decadron or solumedrol  Reflux. - per primary team  Peripheral eosinophilia. - doesn't seem as likely that this is pulmonary in origin given that her respiratory status has improved since her hospital admission - f/u serology - defer to primary about consulting hematology   Disposition / Summary of Today's Plan 07/18/18   W/u in progress to assess peripheral eosinophilia.    Diet: regular  DVT prophylaxis:lovenox GI prophylaxis: pepcid Mobility: as tolerated Code Status: full code Family Communication: no family at bedside  Labs   CBC: Recent Labs  Lab 07/13/18 1107 07/14/18 0256 07/15/18 0247 07/16/18 0329 07/18/18 0341  WBC 25.4* 29.5* 31.0* 27.4* 30.2*  NEUTROABS  --   --  6.5 4.9 4.5  HGB 10.3* 11.0* 10.8* 11.4* 10.7*  HCT 32.0* 34.2* 32.8* 35.7* 32.8*  MCV 94.4 93.7 93.7 92.7 93.4  PLT 360 368 368 378 356   Basic Metabolic Panel: Recent Labs  Lab 07/11/18 1732 07/14/18 0256 07/15/18 0247 07/16/18 0329 07/18/18 0341  NA 140 139 137 135 136  K 4.6 4.0 4.0 3.7 3.7  CL 101 105 102 101 103  CO2 25 23 25 24 27   GLUCOSE 77 124* 120* 97 98  BUN 12 11 8 9  6*  CREATININE 1.32*  1.33* 1.25* 1.29* 1.32*  CALCIUM 9.3 8.4* 8.3* 8.4* 8.4*  MG  --   --   --   --  1.8   GFR: Estimated Creatinine Clearance: 37.7 mL/min (A) (by C-G formula based on SCr of 1.32 mg/dL (H)). Recent Labs  Lab 07/11/18 2236  07/14/18 0256 07/15/18 0247 07/16/18 0329 07/18/18 0341 07/18/18 0859  WBC  --    < > 29.5* 31.0* 27.4* 30.2*  --   LATICACIDVEN 1.11  --   --   --   --   --  1.0   < > = values in this interval not displayed.   Liver Function Tests: Recent Labs  Lab 07/11/18 2219 07/18/18 0341  AST 23 29  ALT 16 20  ALKPHOS 134* 88  BILITOT 0.9 0.5  PROT 8.0 6.3*  ALBUMIN 3.1* 2.4*   Recent Labs  Lab 07/11/18 2219 07/18/18 0859  LIPASE 31 37   No results for input(s): AMMONIA in the last 168 hours. ABG    Component Value Date/Time   TCO2 25 08/31/2014 0104    Coagulation Profile: No results for input(s): INR, PROTIME in the last 168 hours. Cardiac Enzymes: No results for input(s): CKTOTAL, CKMB, CKMBINDEX, TROPONINI in the last 168 hours. HbA1C: Hemoglobin A1C  Date/Time Value Ref Range Status  12/26/2017 12:49 PM 5.9  Final  06/19/2017 04:18 PM 5.8  Final   Hgb A1c MFr Bld  Date/Time Value Ref Range Status  08/07/2017 03:27 PM 6.3 4.6 - 6.5 % Final    Comment:    Glycemic Control Guidelines for People with Diabetes:Non Diabetic:  <6%Goal of Therapy: <7%Additional Action Suggested:  >8%   09/25/2016 03:32 PM 6.0 (H) 4.8 - 5.6 % Final    Comment:    (NOTE)         Pre-diabetes: 5.7 - 6.4         Diabetes: >6.4         Glycemic control for adults with diabetes: <7.0    CBG: Recent Labs  Lab 07/12/18 0741 07/12/18 1137 07/12/18 1625 07/12/18 2124 07/13/18 0753  GLUCAP 107* 118* 108* 119* 94    D/w Dr. 07/14/18, MD [facility] Pulmonary/Critical Care 07/18/2018, 3:26 PM

## 2018-07-19 ENCOUNTER — Encounter (HOSPITAL_COMMUNITY)
Admission: EM | Disposition: A | Discharge status: Home / Self Care | Payer: Self-pay | Source: Home / Self Care | Attending: Internal Medicine

## 2018-07-19 ENCOUNTER — Encounter (HOSPITAL_COMMUNITY): Payer: Self-pay | Admitting: *Deleted

## 2018-07-19 ENCOUNTER — Inpatient Hospital Stay (HOSPITAL_COMMUNITY): Payer: Medicare HMO | Admitting: Certified Registered Nurse Anesthetist

## 2018-07-19 ENCOUNTER — Encounter: Payer: Self-pay | Admitting: *Deleted

## 2018-07-19 HISTORY — PX: BIOPSY: SHX5522

## 2018-07-19 HISTORY — PX: ESOPHAGOGASTRODUODENOSCOPY (EGD) WITH PROPOFOL: SHX5813

## 2018-07-19 LAB — CBC WITH DIFFERENTIAL/PLATELET
Band Neutrophils: 1 %
Basophils Absolute: 0.3 10*3/uL — ABNORMAL HIGH (ref 0.0–0.1)
Basophils Relative: 1 %
Blasts: 0 %
Eosinophils Absolute: 18.2 10*3/uL — ABNORMAL HIGH (ref 0.0–0.7)
Eosinophils Relative: 68 %
HCT: 34.8 % — ABNORMAL LOW (ref 36.0–46.0)
Hemoglobin: 11.2 g/dL — ABNORMAL LOW (ref 12.0–15.0)
Lymphocytes Relative: 8 %
Lymphs Abs: 2.1 10*3/uL (ref 0.7–4.0)
MCH: 30.1 pg (ref 26.0–34.0)
MCHC: 32.2 g/dL (ref 30.0–36.0)
MCV: 93.5 fL (ref 78.0–100.0)
Metamyelocytes Relative: 0 %
Monocytes Absolute: 0.8 10*3/uL (ref 0.1–1.0)
Monocytes Relative: 3 %
Myelocytes: 0 %
Neutro Abs: 5.3 10*3/uL (ref 1.7–7.7)
Neutrophils Relative %: 19 %
Other: 0 %
Platelets: 382 10*3/uL (ref 150–400)
Promyelocytes Relative: 0 %
RBC: 3.72 MIL/uL — ABNORMAL LOW (ref 3.87–5.11)
RDW: 14.2 % (ref 11.5–15.5)
Smear Review: ADEQUATE
WBC: 26.7 10*3/uL — ABNORMAL HIGH (ref 4.0–10.5)
nRBC: 0 /100 WBC

## 2018-07-19 LAB — ANCA TITERS
Atypical P-ANCA titer: <1:20 {titer}
C-ANCA: <1:20 {titer}
P-ANCA: <1:20 {titer}

## 2018-07-19 LAB — H. PYLORI ANTIBODY, IGG: H Pylori IgG: 0.27 Index Value (ref 0.00–0.79)

## 2018-07-19 SURGERY — ESOPHAGOGASTRODUODENOSCOPY (EGD) WITH PROPOFOL
Anesthesia: Monitor Anesthesia Care

## 2018-07-19 MED ORDER — PROPOFOL 500 MG/50ML IV EMUL
INTRAVENOUS | Status: DC | PRN
  Administered 2018-07-19: 125 ug/kg/min via INTRAVENOUS

## 2018-07-19 MED ORDER — LIDOCAINE HCL (CARDIAC) PF 100 MG/5ML IV SOSY
PREFILLED_SYRINGE | INTRAVENOUS | Status: DC | PRN
  Administered 2018-07-19: 60 mg via INTRAVENOUS

## 2018-07-19 MED ORDER — SODIUM CHLORIDE 0.9 % IV SOLN
INTRAVENOUS | Status: DC | PRN
  Administered 2018-07-19: 14:00:00 via INTRAVENOUS

## 2018-07-19 MED ORDER — PROPOFOL 10 MG/ML IV BOLUS
INTRAVENOUS | Status: DC | PRN
  Administered 2018-07-19 (×2): 20 mg via INTRAVENOUS

## 2018-07-19 MED ORDER — ESMOLOL HCL 100 MG/10ML IV SOLN
INTRAVENOUS | Status: DC | PRN
  Administered 2018-07-19 (×2): 20 mg via INTRAVENOUS

## 2018-07-19 MED ORDER — ONDANSETRON HCL 4 MG/2ML IJ SOLN
INTRAMUSCULAR | Status: DC | PRN
  Administered 2018-07-19: 4 mg via INTRAVENOUS

## 2018-07-19 SURGICAL SUPPLY — 15 items

## 2018-07-19 NOTE — Consult Note (Addendum)
   [facility] CM Inpatient Consult   07/19/2018  Desiree [name] 10/02/1951 [id number]    [facility] Care Management bedside follow up due to unplanned readmission risk score of 24% (high).   Went to bedside to speak with [name] about [facility] Care Management program. She is agreeable and [facility] written consent obtained. [facility] folder provided.   [name] reports she lives alone.  Denies any concerns with transportation.  States she has issues with affording   her Anoro inhaler. Agreeable to [facility] Pharmacist referral. Also states she is interested in meals. She is agreeable to [facility] Social Worker referral for meals as well. Confirms Primary Care MD is Dr. Fabian Sharp with [facility] ( listed as doing transition of care calls). Confirmed best contact number for [name] is [phone].  Listed emergency contact Caron Presume (brother) on [facility] consent at [phone].  Expresses appreciation of visit.   Made inpatient RNCM aware [facility] will follow post discharge.   [name] has a history of DM, HTN, anxiety, bipolar, CKD 3, depression, HLD, COPD.  Will make referral to [facility] for COPD and complex case management. [facility] for medication affordability- Anoro Ellipta.[facility] Social Worker for [employer]- meals.   Raiford Noble, MSN-Ed, RN,BSN [facility] Liaison [phone]

## 2018-07-19 NOTE — Progress Notes (Signed)
NAME:  Desiree [name], MRN:  [id number], DOB:  1951/09/17, LOS: 7 ADMISSION DATE:  07/11/2018, CONSULTATION DATE:  07/15/2018 REFERRING MD:  Dr. Jerral Ralph, [facility], CHIEF COMPLAINT:  Short of breath   Brief History   67 yo female with sinus congestion/pressure, shortness of breath, fever, nausea with vomiting, and productive cough.  She has hx of COPD/emphysema and bronchiectasis.  Found to have increased Rt lung infiltrate.  Reports hx of allergy to prednisone and hydrocortisone.  Followed by Dr. Kendrick Fries in pulmonary office.  Past Medical History DM, HTN, Anxiety, Bipolar, CKD 3, Depression, GERD, Hiatal hernia, HLD  Significant Hospital Events   9/19 Admit 9/27 EGD  Consults: date of consult/date signed off & final recs:   Procedures (surgical and bedside):   Significant Diagnostic Tests:  RAST 09/24/17 >> negative, IgE 120 Serology 09/25/17 >> RF 127, anti CCP negative PFT 10/24/17 >> FEV1 1.36 (74%), FVC 1.79 (76%), TLC 6.19 (128%) DLCO 59%, small airways obstruction Quantiferon gold 11/15/17 >> negative CT chest 07/11/18 (reviewed by me) >> interstitial fibrosis, BTX with plugging at bases, patchy ASD Rt lower lung, bronchial thickening  CT abd/pelvis 9/26 >> small Rt effusion with Rt lower lobe area of consolidation  Micro Data: Blood 9/19 >> negative  Antimicrobials:  Rocephin 9/19 >> 9/22 Levaquin 9/19 >> 9/19 Zithromax 9/20 >> 9/22  Vancomycin 9/23 >> 9/25  Cefepime 9/23 >> 9/25 Levaquin 9/25 >>  Subjective:  Still has heartburn.  Also having episodes of vomiting.  Has cough with clear sputum.  No chest pain or wheeze.  Objective   Blood pressure 122/72, pulse 98, temperature 99.8 F (37.7 C), temperature source Oral, resp. rate 20, height 5' 2.01" (1.575 m), weight 67 kg, SpO2 97 %.        Intake/Output Summary (Last 24 hours) at 07/19/2018 1308 Last data filed at 07/19/2018 1049 Gross per 24 hour  Intake 20 ml  Output -  Net 20 ml   Filed Weights   07/12/18 0739  07/16/18 1287 07/16/18 2350  Weight: 67.7 kg 67 kg 67 kg    Examination:  General - alert Eyes - pupils reactive ENT - no sinus tenderness, no stridor Cardiac - regular rate/rhythm, no murmur Chest - scattered rhonchi, no wheeze Abdomen - soft, non tender, + bowel sounds Extremities - no cyanosis, clubbing, or edema Skin - no rashes Lymphatics - no lymphadenopathy Neuro - normal strength, moves extremities, follows commands Psych - normal mood and behavior    Assessment & Plan:   Pneumonia in setting of bronchiectasis and COPD. - day 9/10 Abx, currently on levaquin - continue anoro, singulair, prn albuterol  Acute sinusitis. - complete ABx - flonase, singulair, nasal irrigation  Reported hx of allergy to prednisone and hydrocortisone. - she is not aware of having reaction to decadron  Peripheral eosinophilia. - having GI symptoms (reflux, vomiting) - for EGD 9/27 - seems less likely could be from pulmonary source - will check IgE - f/u ANCA, CBC with diff   Disposition / Summary of Today's Plan 07/19/18   For EGD 9/27.  PCCM will f/u on Monday 07/22/18 >> call if help needed sooner.    Diet: regular  DVT prophylaxis:lovenox GI prophylaxis: protonix Mobility: as tolerated Code Status: full code Family Communication: no family at bedside  Labs   CBC: Recent Labs  Lab 07/14/18 0256 07/15/18 0247 07/16/18 0329 07/18/18 0341 07/19/18 0807  WBC 29.5* 31.0* 27.4* 30.2* 26.7*  NEUTROABS  --  6.5 4.9 4.5 5.3  HGB 11.0* 10.8* 11.4* 10.7* 11.2*  HCT 34.2* 32.8* 35.7* 32.8* 34.8*  MCV 93.7 93.7 92.7 93.4 93.5  PLT 368 368 378 356 382   Basic Metabolic Panel: Recent Labs  Lab 07/14/18 0256 07/15/18 0247 07/16/18 0329 07/18/18 0341  NA 139 137 135 136  K 4.0 4.0 3.7 3.7  CL 105 102 101 103  CO2 23 25 24 27   GLUCOSE 124* 120* 97 98  BUN 11 8 9  6*  CREATININE 1.33* 1.25* 1.29* 1.32*  CALCIUM 8.4* 8.3* 8.4* 8.4*  MG  --   --   --  1.8    GFR: Estimated Creatinine Clearance: 37.7 mL/min (A) (by C-G formula based on SCr of 1.32 mg/dL (H)). Recent Labs  Lab 07/15/18 0247 07/16/18 0329 07/18/18 0341 07/18/18 0859 07/19/18 0807  WBC 31.0* 27.4* 30.2*  --  26.7*  LATICACIDVEN  --   --   --  1.0  --    Liver Function Tests: Recent Labs  Lab 07/18/18 0341  AST 29  ALT 20  ALKPHOS 88  BILITOT 0.5  PROT 6.3*  ALBUMIN 2.4*   Recent Labs  Lab 07/18/18 0859  LIPASE 37   No results for input(s): AMMONIA in the last 168 hours. ABG    Component Value Date/Time   TCO2 25 08/31/2014 0104    Coagulation Profile: No results for input(s): INR, PROTIME in the last 168 hours. Cardiac Enzymes: No results for input(s): CKTOTAL, CKMB, CKMBINDEX, TROPONINI in the last 168 hours. HbA1C: Hemoglobin A1C  Date/Time Value Ref Range Status  12/26/2017 12:49 PM 5.9  Final  06/19/2017 04:18 PM 5.8  Final   Hgb A1c MFr Bld  Date/Time Value Ref Range Status  08/07/2017 03:27 PM 6.3 4.6 - 6.5 % Final    Comment:    Glycemic Control Guidelines for People with Diabetes:Non Diabetic:  <6%Goal of Therapy: <7%Additional Action Suggested:  >8%   09/25/2016 03:32 PM 6.0 (H) 4.8 - 5.6 % Final    Comment:    (NOTE)         Pre-diabetes: 5.7 - 6.4         Diabetes: >6.4         Glycemic control for adults with diabetes: <7.0    CBG: Recent Labs  Lab 07/12/18 1625 07/12/18 2124 07/13/18 0753  GLUCAP 108* 119* 94    D/w Dr. 2125, MD [facility] Pulmonary/Critical Care 07/19/2018, 1:08 PM

## 2018-07-19 NOTE — Consult Note (Signed)
Reason for Consult: Nausea, vomiting, and epigastric pain Referring Physician: [facility] Hospital[facility]  Desiree [name] HPI: This is a 67 year old female with a PMH of GERD, COPD, bronchiectasis, CKD, and DM, originally admitted for complaints of SOB.  She was found to have a pneumonia with her bronchiectasis and COPD.  She was treated aggressively with antibiotics and she has improved from the pulmonary standpoint, however, she started to have problems with nausea and vomiting.  Per her recollection, she started to have symptoms two weeks prior to her admission.  She had problems with nausea, vomiting, and a burning sensation in her abdomen.  Protonix seems to help ease the sensation of burning, but vomiting and having a bowel movement also help to lessen her symptoms.  Dr. Loreta [name] performed an EGD on 08/11/2015 with findings of some gastric hyperplastic polyps.  Her blood work consistently shows an elevation in her eosinophil count.  It was not felt to be from a pulmonary source and GI evaluation was requested for further evaluation.  Interestingly, she did have one day where she used Pepcid, an H2RA, but there was not benefit.  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  Past Medical History:  Diagnosis Date  . Anxiety   . Bipolar disorder (HCC)   . CANDIDIASIS, ESOPHAGEAL 07/28/2009   Qualifier: Diagnosis of  By: Desiree Sharp MD, Desiree [name]   . Chronic kidney disease    CKD III  . COPD (chronic obstructive pulmonary disease) (HCC)   . Depression   . Diabetes mellitus without complication (HCC)    no meds  . FH: colonic polyps   . Fractured elbow    right   . GERD (gastroesophageal reflux disease)   . HH (hiatus hernia)   . History of carpal tunnel syndrome   . History of chest pain   . History of transfusion of packed red blood cells   . Hyperlipidemia   . Hypertension   . Neuroleptic-induced tardive dyskinesia   . Osteoarthritis  of more than one site   . Seasonal allergies     Past Surgical History:  Procedure Laterality Date  . ANTERIOR CERVICAL DECOMP/DISCECTOMY FUSION  09/27/2016   C5-6 anterior cervical discectomy and fusion, allograft and plate/notes 34/10/9377  . ANTERIOR CERVICAL DECOMP/DISCECTOMY FUSION N/A 09/27/2016   Procedure: C5-6 Anterior Cervical Discectomy and Fusion, Allograft and Plate;  Surgeon: Desiree Manges, MD;  Location: [facility] OR;  Service: Orthopedics;  Laterality: N/A;  . Back Fusion  2002  . CARPAL TUNNEL RELEASE  yates   left  . COLONOSCOPY N/A 01/05/2014   Procedure: COLONOSCOPY;  Surgeon: Desiree Elizabeth, MD;  Location: [facility] ENDOSCOPY;  Service: Endoscopy;  Laterality: N/A;  . ELBOW SURGERY     age 80  . EXTERNAL EAR SURGERY Left   . EYE SURGERY     "removed white dots under eyelid"  . FINGER SURGERY Left   . Juvara osteomy    . KNEE SURGERY    . NOSE SURGERY    . Rt. toe bunion    . skin, shave biopsy  05/03/2016   Left occipital scalp, top of scalp    Family History  Problem Relation Age of Onset  . Heart attack Father   . Heart disease Father   . Throat cancer Brother   . Diabetes Mother   . Hypertension Mother   . Heart attack Mother   . Diabetes type II Brother   .  Heart disease Brother   . Lung cancer Brother   . Breast cancer Cousin   . Lung cancer Daughter   . Lung cancer Paternal Uncle     Social History:  reports that she quit smoking about 16 years ago. Her smoking use included cigarettes. She has a 60.00 pack-year smoking history. She has never used smokeless tobacco. She reports that she does not drink alcohol or use drugs.  Allergies:  Allergies  Allergen Reactions  . Prednisone Shortness Of Breath, Itching, Nausea And Vomiting and Palpitations  . Solu-Medrol [Methylprednisolone] Anaphylaxis  . Amoxicillin Hives and Rash    Has patient had a PCN reaction causing immediate rash, facial/tongue/throat swelling, SOB or lightheadedness with hypotension:Yes Has  patient had a PCN reaction causing severe rash involving mucus membranes or skin necrosis: NO Has patient had a PCN reaction that required hospitalization NO Has patient had a PCN reaction occurring within the last 10 years: NO If all of the above answers are "NO", then may proceed with Cephalosporin use.   . Codeine Hives, Itching and Rash  . Hydrocodone-Acetaminophen Hives, Itching and Rash  . Penicillins Hives, Itching and Rash    ALLERGIC REACTION TO ORAL AMOXICILLIN Has patient had a PCN reaction causing immediate rash, facial/tongue/throat swelling, SOB or lightheadedness with hypotension: Yes Has patient had a PCN reaction causing severe rash involving mucus membranes or skin necrosis: No Has patient had a PCN reaction that required hospitalization: No Has patient had a PCN reaction occurring within the last 10 years: No If all of the above answers are "NO", then may proceed with Cephalosporin use.    Medications:  Scheduled: . [MAR Hold] atorvastatin  20 mg Oral Daily  . [MAR Hold] DULoxetine  60 mg Oral Daily  . [MAR Hold] enoxaparin (LOVENOX) injection  40 mg Subcutaneous Q24H  . [MAR Hold] fluticasone  1-2 spray Each Nare QHS  . [MAR Hold] guaiFENesin  1,200 mg Oral BID  . [MAR Hold] lamoTRIgine  25 mg Oral Daily  . [MAR Hold] levofloxacin  500 mg Oral Daily  . [MAR Hold] losartan  50 mg Oral Daily  . [MAR Hold] montelukast  10 mg Oral QHS  . [MAR Hold] pantoprazole  40 mg Oral BID AC  . [MAR Hold] phenazopyridine  200 mg Oral BID WC  . [MAR Hold] saccharomyces boulardii  250 mg Oral BID  . [MAR Hold] sodium chloride  2 spray Each Nare BID  . [MAR Hold] umeclidinium-vilanterol  1 puff Inhalation Daily   Continuous:   Results for orders placed or performed during the hospital encounter of 07/11/18 (from the past 24 hour(s))  CBC with Differential/Platelet     Status: Abnormal   Collection Time: 07/19/18  8:07 AM  Result Value Ref Range   WBC 26.7 (H) 4.0 - 10.5 K/uL    RBC 3.72 (L) 3.87 - 5.11 MIL/uL   Hemoglobin 11.2 (L) 12.0 - 15.0 g/dL   HCT 28.4 (L) 13.2 - 44.0 %   MCV 93.5 78.0 - 100.0 fL   MCH 30.1 26.0 - 34.0 pg   MCHC 32.2 30.0 - 36.0 g/dL   RDW 10.2 72.5 - 36.6 %   Platelets 382 150 - 400 K/uL   Neutrophils Relative % 19 %   Lymphocytes Relative 8 %   Monocytes Relative 3 %   Eosinophils Relative 68 %   Basophils Relative 1 %   Band Neutrophils 1 %   Metamyelocytes Relative 0 %   Myelocytes 0 %  Promyelocytes Relative 0 %   Blasts 0 %   nRBC 0 0 /100 WBC   Other 0 %   Neutro Abs 5.3 1.7 - 7.7 K/uL   Lymphs Abs 2.1 0.7 - 4.0 K/uL   Monocytes Absolute 0.8 0.1 - 1.0 K/uL   Eosinophils Absolute 18.2 (H) 0.0 - 0.7 K/uL   Basophils Absolute 0.3 (H) 0.0 - 0.1 K/uL   RBC Morphology POLYCHROMASIA PRESENT    Smear Review PLATELETS APPEAR ADEQUATE      Ct Abdomen Pelvis Wo Contrast  Result Date: 07/18/2018 CLINICAL DATA:  Upper abdominal epigastric pain, vomiting, history of stage III chronic kidney disease, COPD, diabetes mellitus, hypertension, former smoker EXAM: CT ABDOMEN AND PELVIS WITHOUT CONTRAST TECHNIQUE: Multidetector CT imaging of the abdomen and pelvis was performed following the standard protocol without IV contrast. Sagittal and coronal MPR images reconstructed from axial data set. Patient drank dilute oral contrast for exam. COMPARISON:  01/03/2017 FINDINGS: Lower chest: Small RIGHT pleural effusion. Consolidation RIGHT lower lobe. Minimal LEFT basilar atelectasis. Hepatobiliary: Gallbladder and liver normal appearance Pancreas: Normal appearance Spleen: Normal appearance Adrenals/Urinary Tract: Adrenal glands unremarkable. Nonobstructing calculus mid inferior LEFT kidney. No definite renal mass or hydronephrosis. No ureteral calcification or dilatation. Bladder unremarkable. Stomach/Bowel: Normal appendix. Stomach and bowel loops normal appearance Vascular/Lymphatic: Atherosclerotic calcifications aorta and iliac arteries without  aneurysm. Scattered normal size retroperitoneal lymph nodes. No definite abdominal or pelvic adenopathy. Reproductive: Unremarkable uterus and ovaries Other: No free air or free fluid. Minimal edema within tissue planes in the pelvis versus previous exam though no specific/localized acute inflammatory process is identified. Musculoskeletal: Osseous demineralization. Lumbar fusion L4-L5 with minimal anterolisthesis. Facet degenerative changes lower lumbar spine. No acute bony findings. IMPRESSION: Small RIGHT pleural effusion with mild consolidation in RIGHT lower lobe. Minimal edema of soft tissue planes in pelvis though no specific acute inflammatory processes localized. Small nonobstructing LEFT renal calculus. Remainder of exam unremarkable. Electronically Signed   By: Ulyses Southward M.D.   On: 07/18/2018 14:21    ROS:  As stated above in the HPI otherwise negative.  Blood pressure 116/70, pulse 99, temperature 98.5 F (36.9 C), temperature source Oral, resp. rate 18, height 5' 2.01" (1.575 m), weight 67 kg, SpO2 93 %.    PE: Gen: NAD, Alert and Oriented HEENT:  [place]/AT, EOMI Neck: Supple, no LAD Lungs: CTA Bilaterally CV: RRR without M/G/R ABM: Soft, tender in the mid abdomen and epigastrium, +BS Ext: No C/C/E  Assessment/Plan: 1) ABM pain. 2) Eosinophilia. 3) COPD/Bronchiectasis. 4) Pneumonia.   The source of her symptoms are not clear.  With the elevation in her eosinophils, coupled with nausea and vomiting, and EGD is reasonable.  Biopsies will be obtained.  Plan: 1) EGD with biopsies.  Necola Bluestein D 07/19/2018, 1:49 PM

## 2018-07-19 NOTE — Op Note (Signed)
[facility] Patient Name: Desiree [name] Procedure Date : 07/19/2018 MRN: [id number] Attending MD: Jeani Hawking , MD Date of Birth: August 25, 1951 CSN: [id number] Age: 67 Admit Type: Inpatient Procedure:                Upper GI endoscopy Indications:              Epigastric abdominal pain, Nausea with vomiting Providers:                Jeani Hawking, MD, Dwain Sarna, RN, Verita Schneiders,                            Technician, Virgel Gess, CRNA Referring MD:              Medicines:                Propofol per Anesthesia Complications:            No immediate complications. Estimated Blood Loss:     Estimated blood loss was minimal. Procedure:                Pre-Anesthesia Assessment:                           - Prior to the procedure, a History and Physical                            was performed, and patient medications and                            allergies were reviewed. The patient's tolerance of                            previous anesthesia was also reviewed. The risks                            and benefits of the procedure and the sedation                            options and risks were discussed with the patient.                            All questions were answered, and informed consent                            was obtained. Prior Anticoagulants: The patient has                            taken no previous anticoagulant or antiplatelet                            agents. ASA Grade Assessment: III - A patient with                            severe systemic disease. After reviewing the risks  and benefits, the patient was deemed in                            satisfactory condition to undergo the procedure.                           - Sedation was administered by an anesthesia                            professional. Deep sedation was attained.                           After obtaining informed consent, the endoscope was              passed under direct vision. Throughout the                            procedure, the patient's blood pressure, pulse, and                            oxygen saturations were monitored continuously. The                            GIF-H190 ([id number] Olympus Adult EGD was introduced                            through the mouth, and advanced to the second part                            of duodenum. The upper GI endoscopy was                            accomplished without difficulty. The patient                            tolerated the procedure well. Scope In: Scope Out: Findings:      The examined esophagus was normal. Biopsies were taken with a cold       forceps for histology.      Diffuse mild inflammation characterized by erythema was found in the       gastric body. Biopsies were taken with a cold forceps for histology.      Patchy mild inflammation characterized by erythema was found in the       duodenal bulb and in the second portion of the duodenum. Biopsies were       taken with a cold forceps for histology. An adequate image of the       duodenum was not obtained. Impression:               - Normal esophagus. Biopsied.                           - Gastritis. Biopsied.                           - Duodenitis. Biopsied. Recommendation:           -  Return patient to hospital ward for ongoing care.                           - Resume regular diet.                           - Continue present medications.                           - Await pathology results.                           - Continue with symptomatic control, for now. Procedure Code(s):        --- Professional ---                           [phone], Esophagogastroduodenoscopy, flexible,                            transoral; with biopsy, single or multiple Diagnosis Code(s):        --- Professional ---                           K29.70, Gastritis, unspecified, without bleeding                           K29.80,  Duodenitis without bleeding                           R10.13, Epigastric pain                           R11.2, Nausea with vomiting, unspecified CPT copyright 2017 [employer]. All rights reserved. The codes documented in this report are preliminary and upon coder review may  be revised to meet current compliance requirements. Jeani Hawking, MD Jeani Hawking, MD 07/19/2018 2:16:08 PM This report has been signed electronically. Number of Addenda: 0

## 2018-07-19 NOTE — Consult Note (Signed)
   [facility] CM Inpatient Consult   07/19/2018  Desiree [name] 22-Feb-1951 [id number]     Patient screened for potential [facility] Care Management services due to unplanned readmission risk score of 23% (high).   Went to bedside to speak with patient about [facility] Care Management program. However, she was bathing at the time.   Will come back later.   Raiford Noble, MSN-Ed, RN,BSN [facility] Liaison [phone]

## 2018-07-19 NOTE — Transfer of Care (Signed)
Immediate Anesthesia Transfer of Care Note  Patient: Desiree [name]  Procedure(s) Performed: ESOPHAGOGASTRODUODENOSCOPY (EGD) WITH PROPOFOL (N/A ) BIOPSY  Patient Location: PACU  Anesthesia Type:MAC  Level of Consciousness: awake, alert  and patient cooperative  Airway & Oxygen Therapy: Patient Spontanous Breathing and Patient connected to nasal cannula oxygen  Post-op Assessment: Report given to RN, Post -op Vital signs reviewed and stable, Patient moving all extremities X 4 and Patient able to stick tongue midline  Post vital signs: Reviewed and stable  Last Vitals:  Vitals Value Taken Time  BP 236/114 07/19/2018  2:24 PM  Temp 37.2 C 07/19/2018  2:23 PM  Pulse 95 07/19/2018  2:25 PM  Resp 25 07/19/2018  2:25 PM  SpO2 95 % 07/19/2018  2:25 PM  Vitals shown include unvalidated device data.  Last Pain:  Vitals:   07/19/18 1423  TempSrc: Oral  PainSc: 4       Patients Stated Pain Goal: 2 (84/72/07 2182)  Complications: No apparent anesthesia complications

## 2018-07-19 NOTE — Anesthesia Postprocedure Evaluation (Signed)
Anesthesia Post Note  Patient: Desiree [name]  Procedure(s) Performed: ESOPHAGOGASTRODUODENOSCOPY (EGD) WITH PROPOFOL (N/A ) BIOPSY     Patient location during evaluation: PACU Anesthesia Type: MAC Level of consciousness: awake and alert Pain management: pain level controlled Vital Signs Assessment: post-procedure vital signs reviewed and stable Respiratory status: spontaneous breathing, nonlabored ventilation, respiratory function stable and patient connected to nasal cannula oxygen Cardiovascular status: stable and blood pressure returned to baseline Postop Assessment: no apparent nausea or vomiting Anesthetic complications: no    Last Vitals:  Vitals:   07/19/18 1433 07/19/18 1442  BP: (!) 160/86 (!) 148/77  Pulse: 88 84  Resp: 17 (!) 120  Temp:    SpO2: 96% 94%    Last Pain:  Vitals:   07/19/18 1433  TempSrc:   PainSc: 4                  [name]

## 2018-07-19 NOTE — Care Management Note (Signed)
Case Management Note  Patient Details  Name: Desiree [name] MRN: [id number] Date of Birth: 1950/12/30  Subjective/Objective:   From home alone, pta indep copd ex, CAP, cxr with worsening r side infiltrate, cont with iv abx, ? Hypertrophic cardiomyopathy per MD note.  9/27 Letha Cape RN BSN- GI consulted, for EGD today.  [facility] also will follow patient in the Community.                  Action/Plan: NCM will follow for transition of care needs.  Expected Discharge Date:                  Expected Discharge Plan:  Home/Self Care  In-House Referral:  [facility]  Discharge planning Services  CM Consult  Post Acute Care Choice:    Choice offered to:     DME Arranged:    DME Agency:     HH Arranged:    HH Agency:     Status of Service:  Completed, signed off  If discussed at [employer] of Stay Meetings, dates discussed:    Additional Comments:  Leone Haven, RN 07/19/2018, 2:58 PM

## 2018-07-19 NOTE — Progress Notes (Signed)
PROGRESS NOTE        PATIENT DETAILS Name: Desiree [name] Age: 67 y.o. Sex: female Date of Birth: [birth year] Admit Date: 07/11/2018 Admitting Physician Ejiroghene Wendall Stade, MD PQD:IYMEBR, Neta Mends, MD  Brief Narrative: Patient is a 67 y.o. female with prior history of bronchiectasis, COPD, DM, hypertension resenting with worsening cough and increased sputum production-was gradually progressed to worsening shortness of breath over the past week or so, she presented to the emergency room where she was thought to have pneumonia along with COPD exacerbation.  She was subsequently admitted to the hospitalist service.  See below for further details.  Subjective: Patient started having nausea and vomiting as well as more abdominal pain this morning.  No diarrhea.  No fever no chills.  Assessment/Plan: Community-acquired pneumonia: Somewhat improved-cough becoming more productive-and she is able to get sputum out after initiation of Mucomyst nebs along with incentive spirometry, chest PT/vibrator vest.  Due to worsening leukocytosis-she was switched to vancomycin and cefepime on 9/23 (bronchiectasis-so concern for drug-resistant organisms like MRSA and Pseudomonas) thankfully leukocytosis has started to trend down.  All cultures negative so far.  PCCM following-switch to oral Levaquin.  COPD/bronchiectasis exacerbation: Slow improvement continues-still wheezing-still not back to her baseline-unable to use steroids as she had an allergic reaction to Solu-Medrol post admission, she apparently has known allergic reaction to prednisone and hydrocortisone.  Currently being managed with scheduled bronchodilators, Mucinex, Mucomyst nebs and vibrator vest/chest PT.  Appreciate PCCM evaluation.    Hypertrophic cardiomyopathy: Echo in 2018 showed septal asymmetry, holding off on starting beta-blockers as she is still very rhonchorous/bronchospasm.  Once improvement occurs further-suspect  we can use a very selective beta-blocker closer to discharge.  Likely will start her on Lopressor tomorrow  Hypertension: Controlled-continue losartan  CKD stage III: Creatinine close to usual baseline-follow.  Dyslipidemia: Continue statin  GERD: Recurrent nausea with intractable vomiting S/P EGD with gastritis and duodenitis.  Biopsies taken. Continue PPI  Bipolar disorder/anxiety: Stable-continue Cymbalta, Lamictal  Eosinophilia  D/w PCCM recommended no further work up.  D/w hematology recommended outpatient follow up in 1 month To persistent nausea and vomiting recommended GI to consider EGD.  EGD showed gastritis and duodenitis but nothing significant 2.2 hours eosinophilic gastritis or esophagitis.  Biopsies are taken.  Currently treating supportively. Discussed with ID recommend to send antibodies for Strongyloides.  Ova and parasite for stool sent as well.  DVT Prophylaxis: Prophylactic Lovenox   Code Status: Full code   Family Communication: None at bedside  Disposition Plan: Home when stable  Antimicrobial agents: Anti-infectives (From admission, onward)   Start     Dose/Rate Route Frequency Ordered Stop   07/18/18 1000  levofloxacin (LEVAQUIN) tablet 500 mg     500 mg Oral Daily 07/17/18 1350     07/17/18 1000  ceFEPIme (MAXIPIME) 2 g in sodium chloride 0.9 % 100 mL IVPB  Status:  Discontinued     2 g 200 mL/hr over 30 Minutes Intravenous Every 24 hours 07/16/18 1010 07/17/18 1326   07/16/18 1100  vancomycin (VANCOCIN) IVPB 1000 mg/200 mL premix  Status:  Discontinued     1,000 mg 200 mL/hr over 60 Minutes Intravenous Every 24 hours 07/15/18 1427 07/17/18 1326   07/15/18 1000  vancomycin (VANCOCIN) IVPB 1000 mg/200 mL premix     1,000 mg 200 mL/hr over 60 Minutes Intravenous  Once  07/15/18 0851 07/15/18 1231   07/15/18 0930  ceFEPIme (MAXIPIME) 2 g in sodium chloride 0.9 % 100 mL IVPB  Status:  Discontinued     2 g 200 mL/hr over 30 Minutes Intravenous  Every 12 hours 07/15/18 0851 07/16/18 1010   07/13/18 2000  levofloxacin (LEVAQUIN) tablet 750 mg  Status:  Discontinued     750 mg Oral Every 48 hours 07/11/18 2240 07/12/18 0726   07/12/18 1600  azithromycin (ZITHROMAX) 500 mg in sodium chloride 0.9 % 250 mL IVPB  Status:  Discontinued     500 mg 250 mL/hr over 60 Minutes Intravenous Every 24 hours 07/12/18 0056 07/15/18 0841   07/12/18 0100  cefTRIAXone (ROCEPHIN) 1 g in sodium chloride 0.9 % 100 mL IVPB  Status:  Discontinued     1 g 200 mL/hr over 30 Minutes Intravenous Every 24 hours 07/12/18 0056 07/15/18 0841   07/11/18 2145  levofloxacin (LEVAQUIN) IVPB 750 mg     750 mg 100 mL/hr over 90 Minutes Intravenous  Once 07/11/18 2132 07/12/18 0015      Procedures: None  CONSULTS: GI CCM Phone consultation with hematology as well as ID  Time spent: 35- minutes-Greater than 50% of this time was spent in counseling, explanation of diagnosis, planning of further management, and coordination of care.  MEDICATIONS: Scheduled Meds: . atorvastatin  20 mg Oral Daily  . DULoxetine  60 mg Oral Daily  . enoxaparin (LOVENOX) injection  40 mg Subcutaneous Q24H  . fluticasone  1-2 spray Each Nare QHS  . guaiFENesin  1,200 mg Oral BID  . lamoTRIgine  25 mg Oral Daily  . levofloxacin  500 mg Oral Daily  . losartan  50 mg Oral Daily  . montelukast  10 mg Oral QHS  . pantoprazole  40 mg Oral BID AC  . phenazopyridine  200 mg Oral BID WC  . saccharomyces boulardii  250 mg Oral BID  . sodium chloride  2 spray Each Nare BID  . umeclidinium-vilanterol  1 puff Inhalation Daily   Continuous Infusions:  PRN Meds:.acetaminophen **OR** acetaminophen, albuterol, ondansetron **OR** ondansetron (ZOFRAN) IV   PHYSICAL EXAM: General appearance:Awake, alert, not in any distress.  Eyes:no scleral icterus. HEENT: Atraumatic and Normocephalic Neck: supple, no JVD. Resp:Good air entry bilaterally, rhonchi all over CVS: S1 S2 regular, no murmurs.    GI: Bowel sounds present, Non tender and not distended with no gaurding, rigidity or rebound. Extremities: B/L Lower Ext shows no edema, both legs are warm to touch Neurology:  Non focal Psychiatric: Normal judgment and insight. Normal mood. Musculoskeletal:No digital cyanosis Skin:No Rash, warm and dry Wounds:N/A   Vital signs: Vitals:   07/19/18 1442 07/19/18 1457 07/19/18 1729 07/19/18 1730  BP: (!) 148/77 129/62 (!) 117/58   Pulse: 84 88 91   Resp: (!) 120 20 18   Temp:  98.1 F (36.7 C) 100.3 F (37.9 C) 100.1 F (37.8 C)  TempSrc:  Oral Oral Oral  SpO2: 94% 94% 94%   Weight:      Height:       Filed Weights   07/12/18 0739 07/16/18 0632 07/16/18 2350  Weight: 67.7 kg 67 kg 67 kg   Body mass index is 27.01 kg/m.   General appearance:Awake, alert, not in any distress.  Eyes:no scleral icterus. HEENT: Atraumatic and Normocephalic Neck: supple, no JVD. Resp:Good air entry bilaterally, CVS: S1 S2 regular, no murmurs.  GI: Bowel sounds present, Non tender and not distended with no gaurding, rigidity or rebound.  Extremities: B/L Lower Ext shows no edema, both legs are warm to touch Neurology:  Non focal Psychiatric: Normal judgment and insight. Normal mood. Musculoskeletal:No digital cyanosis Skin:No Rash, warm and dry Wounds:N/A  I have personally reviewed following labs and imaging studies  LABORATORY DATA: CBC: Recent Labs  Lab 07/14/18 0256 07/15/18 0247 07/16/18 0329 07/18/18 0341 07/19/18 0807  WBC 29.5* 31.0* 27.4* 30.2* 26.7*  NEUTROABS  --  6.5 4.9 4.5 5.3  HGB 11.0* 10.8* 11.4* 10.7* 11.2*  HCT 34.2* 32.8* 35.7* 32.8* 34.8*  MCV 93.7 93.7 92.7 93.4 93.5  PLT 368 368 378 356 382    Basic Metabolic Panel: Recent Labs  Lab 07/14/18 0256 07/15/18 0247 07/16/18 0329 07/18/18 0341  NA 139 137 135 136  K 4.0 4.0 3.7 3.7  CL 105 102 101 103  CO2 23 25 24 27   GLUCOSE 124* 120* 97 98  BUN 11 8 9  6*  CREATININE 1.33* 1.25* 1.29* 1.32*   CALCIUM 8.4* 8.3* 8.4* 8.4*  MG  --   --   --  1.8    GFR: Estimated Creatinine Clearance: 37.7 mL/min (A) (by C-G formula based on SCr of 1.32 mg/dL (H)).  Liver Function Tests: Recent Labs  Lab 07/18/18 0341  AST 29  ALT 20  ALKPHOS 88  BILITOT 0.5  PROT 6.3*  ALBUMIN 2.4*   Recent Labs  Lab 07/18/18 0859  LIPASE 37   No results for input(s): AMMONIA in the last 168 hours.  Coagulation Profile: No results for input(s): INR, PROTIME in the last 168 hours.  Cardiac Enzymes: No results for input(s): CKTOTAL, CKMB, CKMBINDEX, TROPONINI in the last 168 hours.  BNP (last 3 results) No results for input(s): PROBNP in the last 8760 hours.  HbA1C: No results for input(s): HGBA1C in the last 72 hours.  CBG: Recent Labs  Lab 07/12/18 2124 07/13/18 0753  GLUCAP 119* 94    Lipid Profile: No results for input(s): CHOL, HDL, LDLCALC, TRIG, CHOLHDL, LDLDIRECT in the last 72 hours.  Thyroid Function Tests: No results for input(s): TSH, T4TOTAL, FREET4, T3FREE, THYROIDAB in the last 72 hours.  Anemia Panel: No results for input(s): VITAMINB12, FOLATE, FERRITIN, TIBC, IRON, RETICCTPCT in the last 72 hours.  Urine analysis:    Component Value Date/Time   COLORURINE YELLOW 07/11/2018 0046   APPEARANCEUR HAZY (A) 07/11/2018 0046   LABSPEC 1.020 07/11/2018 0046   PHURINE 5.0 07/11/2018 0046   GLUCOSEU NEGATIVE 07/11/2018 0046   HGBUR NEGATIVE 07/11/2018 0046   HGBUR negative 03/11/2008 1317   BILIRUBINUR NEGATIVE 07/11/2018 0046   BILIRUBINUR n 08/07/2017 1558   KETONESUR NEGATIVE 07/11/2018 0046   PROTEINUR 100 (A) 07/11/2018 0046   UROBILINOGEN 1.0 08/07/2017 1558   UROBILINOGEN 0.2 08/16/2014 1513   NITRITE NEGATIVE 07/11/2018 0046   LEUKOCYTESUR MODERATE (A) 07/11/2018 0046    Sepsis Labs: Lactic Acid, Venous    Component Value Date/Time   LATICACIDVEN 1.0 07/18/2018 0859    MICROBIOLOGY: Recent Results (from the past 240 hour(s))  Urine culture      Status: Abnormal   Collection Time: 07/11/18  9:32 PM  Result Value Ref Range Status   Specimen Description URINE, RANDOM  Final   Special Requests NONE  Final   Culture (A)  Final    <10,000 COLONIES/mL INSIGNIFICANT GROWTH Performed at [facility], [address] [place], [address] [place]    Report Status 07/13/2018 FINAL  Final  Blood Culture (routine x 2)     Status:  None   Collection Time: 07/11/18 10:19 PM  Result Value Ref Range Status   Specimen Description BLOOD RIGHT FOREARM  Final   Special Requests   Final    BOTTLES DRAWN AEROBIC AND ANAEROBIC Blood Culture adequate volume   Culture   Final    NO GROWTH 5 DAYS Performed at [facility], [address], [place], [place] [address]    Report Status 07/16/2018 FINAL  Final  Blood Culture (routine x 2)     Status: None   Collection Time: 07/11/18 10:19 PM  Result Value Ref Range Status   Specimen Description BLOOD LEFT ANTECUBITAL  Final   Special Requests   Final    BOTTLES DRAWN AEROBIC AND ANAEROBIC Blood Culture results may not be optimal due to an inadequate volume of blood received in culture bottles   Culture   Final    NO GROWTH 5 DAYS Performed at [facility], [address] [place], [place] [address]    Report Status 07/16/2018 FINAL  Final    RADIOLOGY STUDIES/RESULTS: Ct Abdomen Pelvis Wo Contrast  Result Date: 07/18/2018 CLINICAL DATA:  Upper abdominal epigastric pain, vomiting, history of stage III chronic kidney disease, COPD, diabetes mellitus, hypertension, former smoker EXAM: CT ABDOMEN AND PELVIS WITHOUT CONTRAST TECHNIQUE: Multidetector CT imaging of the abdomen and pelvis was performed following the standard protocol without IV contrast. Sagittal and coronal MPR images reconstructed from axial data set. Patient drank dilute oral contrast for exam. COMPARISON:  01/03/2017 FINDINGS: Lower chest: Small RIGHT pleural effusion. Consolidation RIGHT lower lobe. Minimal LEFT basilar  atelectasis. Hepatobiliary: Gallbladder and liver normal appearance Pancreas: Normal appearance Spleen: Normal appearance Adrenals/Urinary Tract: Adrenal glands unremarkable. Nonobstructing calculus mid inferior LEFT kidney. No definite renal mass or hydronephrosis. No ureteral calcification or dilatation. Bladder unremarkable. Stomach/Bowel: Normal appendix. Stomach and bowel loops normal appearance Vascular/Lymphatic: Atherosclerotic calcifications aorta and iliac arteries without aneurysm. Scattered normal size retroperitoneal lymph nodes. No definite abdominal or pelvic adenopathy. Reproductive: Unremarkable uterus and ovaries Other: No free air or free fluid. Minimal edema within tissue planes in the pelvis versus previous exam though no specific/localized acute inflammatory process is identified. Musculoskeletal: Osseous demineralization. Lumbar fusion L4-L5 with minimal anterolisthesis. Facet degenerative changes lower lumbar spine. No acute bony findings. IMPRESSION: Small RIGHT pleural effusion with mild consolidation in RIGHT lower lobe. Minimal edema of soft tissue planes in pelvis though no specific acute inflammatory processes localized. Small nonobstructing LEFT renal calculus. Remainder of exam unremarkable. Electronically Signed   By: Ulyses Southward M.D.   On: 07/18/2018 14:21   Dg Chest 2 View  Result Date: 07/16/2018 CLINICAL DATA:  Productive cough and shortness of breath with midline chest pain. Former smoker. Currently diagnosed with right lower lobe pneumonia. EXAM: CHEST - 2 VIEW COMPARISON:  Portable chest x-ray of July 14, 2018 FINDINGS: There remains mild volume loss on the right. There is a small right pleural effusion. The left lung is well-expanded. The interstitial markings of both lungs are coarse. The heart and pulmonary vascularity are normal. The mediastinum is normal in width. The bony thorax exhibits no acute abnormality. IMPRESSION: Persistent right lower lobe pneumonia.  Small right pleural effusion. Underlying COPD. Electronically Signed   By: David  [place] M.D.   On: 07/16/2018 10:17   Dg Chest 2 View  Result Date: 07/11/2018 CLINICAL DATA:  Nausea vomiting and headache x1 week EXAM: CHEST - 2 VIEW COMPARISON:  08/22/2017 FINDINGS: Heart size is normal. There is mild aortic atherosclerosis  without aneurysm. No acute pulmonary consolidation. Mild chronic interstitial prominence and bibasilar atelectasis is identified. No overt pulmonary edema. ACDF of the lower cervical spine. No acute osseous abnormality. IMPRESSION: 1. Chronic mild interstitial lung disease and bibasilar atelectasis. 2. Minimal aortic atherosclerosis. 3. No acute pulmonary disease Electronically Signed   By: Tollie Eth M.D.   On: 07/11/2018 18:08   Ct Chest Wo Contrast  Result Date: 07/11/2018 CLINICAL DATA:  Nausea and vomiting for 1 week. Recurrent chest pain for 6 months. Shortness of breath and dizziness with exertion. Productive cough. EXAM: CT CHEST WITHOUT CONTRAST TECHNIQUE: Multidetector CT imaging of the chest was performed following the standard protocol without IV contrast. COMPARISON:  High-resolution chest CT 09/26/2017 FINDINGS: Cardiovascular: Normal heart size. Minimal pericardial effusion. Normal caliber thoracic aorta with scattered calcifications. Mediastinum/Nodes: Mild prominence of mediastinal lymph nodes with pretracheal nodes measuring up to about 10 mm short axis dimension. Similar appearance to previous study. Likely to be reactive. Esophagus is decompressed. Thyroid gland is unremarkable. Lungs/Pleura: Scattered areas of interstitial fibrosis in the lungs. Bronchial wall thickening suggesting chronic bronchitis. Bronchiectasis with mucous plugging in the lung bases. Patchy focal areas of consolidation in the lung bases, more prominent on the right. Consolidative changes are new since previous study suggesting interval development of an acute bronchopneumonia. No pleural  effusions. No pneumothorax. Upper Abdomen: No acute changes identified. Vascular calcifications. Musculoskeletal: Degenerative changes in the thoracic spine. No destructive bone lesions. IMPRESSION: 1. Patchy areas of consolidation in the lung bases, more prominent on the right, suggesting interval development of acute bronchopneumonia. 2. Chronic bronchitic changes with bronchiectasis and mucous plugging in the lung bases. 3. Mild mediastinal lymphadenopathy, likely reactive. 4. Minimal pericardial effusion. Aortic Atherosclerosis (ICD10-I70.0). Electronically Signed   By: Burman Nieves M.D.   On: 07/11/2018 23:55   Dg Chest Port 1v Same Day  Result Date: 07/14/2018 CLINICAL DATA:  Shortness of breath EXAM: PORTABLE CHEST 1 VIEW COMPARISON:  CT chest dated 07/11/2018 FINDINGS: Patchy right lower lobe opacity, suspicious for pneumonia. Associated trace right pleural effusion. Bilateral lower lobe bronchiectasis. No pneumothorax. The heart is normal in size. Cervical spine fixation hardware. IMPRESSION: Patchy right lower lobe opacity, suspicious for pneumonia. Associated trace right pleural effusion. Electronically Signed   By: Charline Bills M.D.   On: 07/14/2018 08:20     LOS: 7 days   Lynden Oxford, MD  [facility]  If 7PM-7AM, please contact night-coverage  Please page via [email or web address] [email or web address]-click on MD name and type text message  07/19/2018, 6:13 PM

## 2018-07-19 NOTE — Anesthesia Preprocedure Evaluation (Signed)
Anesthesia Evaluation  Patient identified by MRN, date of birth, ID band Patient awake    Reviewed: Allergy & Precautions, NPO status , Patient's Chart, lab work & pertinent test results  Airway Mallampati: II  TM Distance: >3 FB Neck ROM: Full    Dental no notable dental hx.    Pulmonary pneumonia, COPD, former smoker,    + rhonchi    (-) rales    Cardiovascular hypertension, Normal cardiovascular exam Rhythm:Regular Rate:Normal     Neuro/Psych Bipolar Disorder negative neurological ROS  negative psych ROS   GI/Hepatic Neg liver ROS, hiatal hernia, GERD  ,  Endo/Other  diabetes  Renal/GU negative Renal ROS  negative genitourinary   Musculoskeletal negative musculoskeletal ROS (+)   Abdominal   Peds negative pediatric ROS (+)  Hematology negative hematology ROS (+)   Anesthesia Other Findings   Reproductive/Obstetrics negative OB ROS                             Anesthesia Physical Anesthesia Plan  ASA: III  Anesthesia Plan: MAC   Post-op Pain Management:    Induction: Intravenous  PONV Risk Score and Plan: 0  Airway Management Planned: Nasal Cannula  Additional Equipment:   Intra-op Plan:   Post-operative Plan:   Informed Consent: I have reviewed the patients History and Physical, chart, labs and discussed the procedure including the risks, benefits and alternatives for the proposed anesthesia with the patient or authorized representative who has indicated his/her understanding and acceptance.   Dental advisory given  Plan Discussed with: CRNA and Surgeon  Anesthesia Plan Comments:         Anesthesia Quick Evaluation

## 2018-07-20 ENCOUNTER — Encounter (HOSPITAL_COMMUNITY): Payer: Self-pay | Admitting: Gastroenterology

## 2018-07-20 DIAGNOSIS — Z23 Encounter for immunization: Secondary | ICD-10-CM | POA: Diagnosis not present

## 2018-07-20 LAB — CBC WITH DIFFERENTIAL/PLATELET
Basophils Absolute: 0 10*3/uL (ref 0.0–0.1)
Basophils Relative: 0 %
Eosinophils Absolute: 18.2 10*3/uL — ABNORMAL HIGH (ref 0.0–0.7)
Eosinophils Relative: 67 %
HCT: 30.9 % — ABNORMAL LOW (ref 36.0–46.0)
Hemoglobin: 10 g/dL — ABNORMAL LOW (ref 12.0–15.0)
Lymphocytes Relative: 11 %
Lymphs Abs: 3 10*3/uL (ref 0.7–4.0)
MCH: 30.3 pg (ref 26.0–34.0)
MCHC: 32.4 g/dL (ref 30.0–36.0)
MCV: 93.6 fL (ref 78.0–100.0)
Monocytes Absolute: 0.8 10*3/uL (ref 0.1–1.0)
Monocytes Relative: 3 %
Neutro Abs: 5.2 10*3/uL (ref 1.7–7.7)
Neutrophils Relative %: 19 %
Platelets: 350 10*3/uL (ref 150–400)
RBC: 3.3 MIL/uL — ABNORMAL LOW (ref 3.87–5.11)
RDW: 14.3 % (ref 11.5–15.5)
Smear Review: ADEQUATE
WBC: 27.2 10*3/uL — ABNORMAL HIGH (ref 4.0–10.5)

## 2018-07-20 LAB — COMPREHENSIVE METABOLIC PANEL
ALT: 20 U/L (ref 0–44)
AST: 31 U/L (ref 15–41)
Albumin: 2.4 g/dL — ABNORMAL LOW (ref 3.5–5.0)
Alkaline Phosphatase: 90 U/L (ref 38–126)
Anion gap: 8 (ref 5–15)
BUN: 9 mg/dL (ref 8–23)
CO2: 27 mmol/L (ref 22–32)
Calcium: 8.3 mg/dL — ABNORMAL LOW (ref 8.9–10.3)
Chloride: 103 mmol/L (ref 98–111)
Creatinine, Ser: 1.38 mg/dL — ABNORMAL HIGH (ref 0.44–1.00)
GFR calc Af Amer: 45 mL/min — ABNORMAL LOW (ref >60)
GFR calc non Af Amer: 39 mL/min — ABNORMAL LOW (ref >60)
Glucose, Bld: 90 mg/dL (ref 70–99)
Potassium: 3.6 mmol/L (ref 3.5–5.1)
Sodium: 138 mmol/L (ref 135–145)
Total Bilirubin: 0.6 mg/dL (ref 0.3–1.2)
Total Protein: 6.2 g/dL — ABNORMAL LOW (ref 6.5–8.1)

## 2018-07-20 LAB — MAGNESIUM: Magnesium: 1.7 mg/dL (ref 1.7–2.4)

## 2018-07-20 MED ORDER — LEVOFLOXACIN 500 MG PO TABS: 500.0000 mg | ORAL_TABLET | Freq: Every day | ORAL | 0 refills | Status: AC

## 2018-07-20 MED ORDER — ENSURE ENLIVE PO LIQD
237.0000 mL | Freq: Three times a day (TID) | ORAL | Status: DC
  Administered 2018-07-20 (×2): 237 mL via ORAL

## 2018-07-20 MED ORDER — LEVOFLOXACIN 500 MG PO TABS
500.0000 mg | ORAL_TABLET | Freq: Every day | ORAL | Status: DC
  Administered 2018-07-20: 500 mg via ORAL
  Filled 2018-07-20: qty 1

## 2018-07-20 MED ORDER — SUCRALFATE 1 GM/10ML PO SUSP: 1.0000 g | Freq: Two times a day (BID) | ORAL | 0 refills | Status: DC

## 2018-07-20 MED ORDER — SUCRALFATE 1 GM/10ML PO SUSP
1.0000 g | Freq: Two times a day (BID) | ORAL | Status: DC
  Administered 2018-07-20: 1 g via ORAL
  Filled 2018-07-20: qty 10

## 2018-07-20 MED ORDER — SUCRALFATE 1 GM/10ML PO SUSP: 1.0000 g | Freq: Two times a day (BID) | ORAL | Status: DC

## 2018-07-20 MED ORDER — ENSURE ENLIVE PO LIQD: 237.0000 mL | Freq: Three times a day (TID) | ORAL | 0 refills | Status: AC

## 2018-07-20 NOTE — Discharge Summary (Signed)
[facility] Discharge Summary   Patient: Desiree [name] EYC:[id number]   PCP: [name], MD DOB: 1951-09-09   Date of admission: 07/11/2018   Date of discharge:  07/20/2018    Discharge Diagnoses:  Active Problems:   Diabetes mellitus with renal manifestations, controlled ([place])   Hypertension   Bipolar I disorder with mania ([place])   Bronchiectasis without complication ([place])   COPD exacerbation ([place])   Pneumonia   Admitted From: HOME Disposition:  HOME  Recommendations for Outpatient Follow-up:  1. Please follow-up with PCP in 1 week, hematology and GI in 1 month, pulmonary in 1 month 2. Patient needs a repeat CBC in 1 month. 3. Antibodies for Strongyloides, aspergillus, IgE, surgical pathology currently pending, need to be followed outpatient   Follow-up Information    Martyn Ehrich, NP Follow up on 07/26/2018.   Specialty:  Pulmonary Disease Why:  11:00 [facility] - 2nd floor.  Contact information: [address] [place] [address] [phone]        [name], MD. Schedule an appointment as soon as possible for a visit in 1 week(s).   Specialties:  Internal Medicine, Pediatrics Contact information: [place] [address] [phone]        Nicholas Lose, MD. Schedule an appointment as soon as possible for a visit in 1 month(s).   Specialty:  Hematology and Oncology Why:  repeat CBC Contact information: [place] [address] [phone]        [name], MD. Schedule an appointment as soon as possible for a visit in 1 month(s).   Specialty:  Gastroenterology Contact information: [address], [place] [address] [phone]          Diet recommendation: Cardiac diet  Activity: The patient is advised to gradually reintroduce usual activities.  Discharge Condition: good  Code Status: Full code  History of present illness: As per  the H and P dictated on admission, "Desiree [name] is a 67 y.o. female with medical history significant for brochiectasis COPD, DM, HTN, BPD, who presented to the ED with complaints of increased shortness of breath, worsening cough with increased sputum purulence and colour change to yellow.  She reports chronic chest pain of at least 6 months to a year, lower sternal and rib cage bilaterally 3-year, sometimes wakes her up from sleep, unrelated to activity, without associated shortness of breath diaphoresis nausea or vomiting.  No swelling to lower extremities no recent travels.    ED Course: Temp 100.5, tachycardia to 114, intermittent tachypnea to 29, O2 sats dropped to 87% on room air so was placed on 2 L nasal cannula. (Not on home O2).  WBC elevated 27.  Creatinine about baseline 1.3.  Remarkable EKG. Lactic acid 1.1.  Two-view chest x-ray showed chronic mild lung disease and bibasilar atelectasis.  Patient meeting SIRS criteria to liter bolus normal saline given IV Levaquin per pharmacy started, hospitalist called to admit for probable pneumonia. "  Hospital Course:  Summary of her active problems in the hospital is as following.  Community-acquired pneumonia Chronic bronchiectasis. COPD. Pulmonary fibrosis. Acute sinusitis: Presents with cough and shortness of breath. Sputum culture blood cultures negative. Pulmonary consulted. Mucomyst, incentive spirometry, chest PT with vest were performed. Patient was initially started on broad-spectrum antibiotics IV vancomycin and cefepime. Clinically got better and therefore she was transitioned to oral Levaquin per recommendation of monitoring. Last day of antibiotic 07/21/2018. Patient  will follow-up with PCCM outpatient. Allergic to steroids therefore not using them.  Hypertrophic cardiomyopathy:  Echo in 2018 showed septal asymmetry,  Since this is a remote finding currently holding off on starting beta-blockers Recommend outpatient  follow-up with PCP to initiate beta-blocker once she is better from her current episode.  Eosinophilia. Patient had leukocytosis since the day of admission, CBC with differential was suggestive of severe is in PDA. Value has been remaining steady for last few days. Peripheral smear review by pathology does not show any acute abnormality. Pulmonary does not feel that the eosinophilia is coming from pulmonary causes. GI was consulted patient underwent EGD which did not show any significant eosinophilic gastritis evidence but does have some mild gastritis as well as duodenitis. Biopsies are currently pending. Discussed with hematology who recommends outpatient follow-up in 1 month.  Referral sent to Dr. Lindi Adie in hematology Discussed with ID who recommended Strongyloides antibody which is currently pending.  Gastritis, duodenitis. History of GERD. Recurrent nausea with intractable vomiting. Symptoms currently resolved the patient is able to tolerate oral diet without any vomiting. S/P EGD this admission with Dr. Almyra Free shows gastritis and duodenitis mild. Biopsies performed currently pending. Continue PPI daily on discharge as well as Carafate.  Hypertension: Controlled-continue losartan  CKD stage III: Creatinine close to usual baseline-follow.  Dyslipidemia: Continue statin  Bipolar disorder/anxiety: Stable-continue Cymbalta, Lamictal  All other chronic medical condition were stable during the hospitalization.  Patient was ambulatory without any assistance. On the day of the discharge the patient's vitals were stable , and no other acute medical condition were reported by patient. the patient was felt safe to be discharge at home with family.  Consultants: PCCM, GI Phone consultation with hematology, ID Procedures: EGD  DISCHARGE MEDICATION: Allergies as of 07/20/2018      Reactions   Prednisone Shortness Of Breath, Itching, Nausea And Vomiting, Palpitations   Solu-medrol  [methylprednisolone] Anaphylaxis   Amoxicillin Hives, Rash   Has patient had a PCN reaction causing immediate rash, facial/tongue/throat swelling, SOB or lightheadedness with hypotension:Yes Has patient had a PCN reaction causing severe rash involving mucus membranes or skin necrosis: NO Has patient had a PCN reaction that required hospitalization NO Has patient had a PCN reaction occurring within the last 10 years: NO If all of the above answers are "NO", then may proceed with Cephalosporin use.   Codeine Hives, Itching, Rash   Hydrocodone-acetaminophen Hives, Itching, Rash   Penicillins Hives, Itching, Rash   ALLERGIC REACTION TO ORAL AMOXICILLIN Has patient had a PCN reaction causing immediate rash, facial/tongue/throat swelling, SOB or lightheadedness with hypotension: Yes Has patient had a PCN reaction causing severe rash involving mucus membranes or skin necrosis: No Has patient had a PCN reaction that required hospitalization: No Has patient had a PCN reaction occurring within the last 10 years: No If all of the above answers are "NO", then may proceed with Cephalosporin use.      Medication List    TAKE these medications   ACCU-CHEK FASTCLIX LANCET Kit USE TO CHECK BLOOD SUGAR 1-2 TIMES DAILY   ACCU-CHEK NANO SMARTVIEW w/Device Kit Use to check blood sugar 1-2 times daily What changed:    how much to take  how to take this  when to take this  additional instructions   acetaminophen 500 MG tablet Commonly known as:  TYLENOL Take 2 tablets (1,000 mg total) by mouth every 6 (six) hours as needed. What changed:  reasons to take this   ALIGN PO Take  1 capsule by mouth daily with breakfast.   atorvastatin 20 MG tablet Commonly known as:  LIPITOR TAKE 1 TABLET BY MOUTH ONCE DAILY   CENTRUM SILVER PO Take 1 tablet by mouth daily.   cetirizine 10 MG tablet Commonly known as:  ZYRTEC Take 10 mg by mouth at bedtime.   COLACE 100 MG capsule Generic drug:  docusate  sodium Take 100 mg by mouth at bedtime.   diclofenac sodium 1 % Gel Commonly known as:  VOLTAREN Apply 3 grams to 3 large joints, up to 3 times daily. What changed:    how much to take  how to take this  when to take this  additional instructions   DULoxetine 60 MG capsule Commonly known as:  CYMBALTA Take 1 capsule (60 mg total) by mouth daily. What changed:  when to take this   feeding supplement (ENSURE ENLIVE) Liqd Take 237 mLs by mouth 3 (three) times daily between meals.   fluticasone 50 MCG/ACT nasal spray Commonly known as:  FLONASE Place 1-2 sprays into both nostrils at bedtime.   FLUTTER Devi 1 Device by Does not apply route as directed. What changed:  how to take this   glucose blood test strip Accu-chek nano, test glucose once daily, dx E11.9   guaiFENesin 600 MG 12 hr tablet Commonly known as:  MUCINEX Take 600 mg by mouth at bedtime.   IBGARD PO Take 1 capsule by mouth every morning.   ipratropium-albuterol 0.5-2.5 (3) MG/3ML Soln Commonly known as:  DUONEB INHALE CONTENTS OF 1 VIAL THREE TIMES A DAY IN NEBULIZER What changed:  See the new instructions.   lamoTRIgine 25 MG tablet Commonly known as:  LAMICTAL Take 1 tablet (25 mg total) by mouth 2 (two) times daily.   levofloxacin 500 MG tablet Commonly known as:  LEVAQUIN Take 1 tablet (500 mg total) by mouth daily for 2 days.   LINZESS 145 MCG Caps capsule Generic drug:  linaclotide Take 145 mcg by mouth daily before breakfast.   losartan 50 MG tablet Commonly known as:  COZAAR TAKE 1 TABLET BY MOUTH ONCE DAILY   montelukast 10 MG tablet Commonly known as:  SINGULAIR Take 1 tablet (10 mg total) at bedtime by mouth.   NON FORMULARY FDgard: Take 1 capsule by mouth at bedtime   NON FORMULARY Respivest: Three times a day for 30 minutes   ondansetron 4 MG disintegrating tablet Commonly known as:  ZOFRAN-ODT Take 1 tablet (4 mg total) by mouth every 8 (eight) hours as needed for  nausea or vomiting.   pantoprazole 40 MG tablet Commonly known as:  PROTONIX Take 40 mg by mouth daily.   sodium chloride HYPERTONIC 3 % nebulizer solution Take by nebulization 2 (two) times daily. What changed:  how much to take   sucralfate 1 GM/10ML suspension Commonly known as:  CARAFATE Take 10 mLs (1 g total) by mouth 2 (two) times daily.   umeclidinium-vilanterol 62.5-25 MCG/INH Aepb Commonly known as:  ANORO ELLIPTA Inhale 1 puff into the lungs daily.      Allergies  Allergen Reactions  . Prednisone Shortness Of Breath, Itching, Nausea And Vomiting and Palpitations  . Solu-Medrol [Methylprednisolone] Anaphylaxis  . Amoxicillin Hives and Rash    Has patient had a PCN reaction causing immediate rash, facial/tongue/throat swelling, SOB or lightheadedness with hypotension:Yes Has patient had a PCN reaction causing severe rash involving mucus membranes or skin necrosis: NO Has patient had a PCN reaction that required hospitalization NO Has patient had  a PCN reaction occurring within the last 10 years: NO If all of the above answers are "NO", then may proceed with Cephalosporin use.   . Codeine Hives, Itching and Rash  . Hydrocodone-Acetaminophen Hives, Itching and Rash  . Penicillins Hives, Itching and Rash    ALLERGIC REACTION TO ORAL AMOXICILLIN Has patient had a PCN reaction causing immediate rash, facial/tongue/throat swelling, SOB or lightheadedness with hypotension: Yes Has patient had a PCN reaction causing severe rash involving mucus membranes or skin necrosis: No Has patient had a PCN reaction that required hospitalization: No Has patient had a PCN reaction occurring within the last 10 years: No If all of the above answers are "NO", then may proceed with Cephalosporin use.   Discharge Instructions    AMB Referral to [facility]   Complete by:  As directed    Please assign to [facility] for COPD and complex case management. [place] for  medication affordability- [employer].[facility] Social Worker for [employer]- meals. PCP office ([facility]) listed as doing toc. Written consent obtained.Currently at [facility]. Please call with questions. Thanks. Marthenia Rolling, [place], [facility] Liaison-[phone]   Reason for consult:  Please assign to [place], [place] Worker, [facility] Pharmacist   Diagnoses of:   Diabetes COPD/ Pneumonia     Expected date of contact:  1-3 days (reserved for hospital discharges)   Ambulatory referral to Hematology / Oncology   Complete by:  As directed    Diet - low sodium heart healthy   Complete by:  As directed    Discharge instructions   Complete by:  As directed    It is important that you read following instructions as well as go over your medication list with RN to help you understand your care after this hospitalization.  Discharge Instructions: Please follow-up with PCP in one week  Please request your primary care physician to go over all Hospital Tests and Procedure/Radiological results at the follow up,  Please get all Hospital records sent to your PCP by signing hospital release before you go home.   Do not take more than prescribed Pain, Sleep and Anxiety Medications. You were cared for by a hospitalist during your hospital stay. If you have any questions about your discharge medications or the care you received while you were in the hospital after you are discharged, you can call the unit and ask to speak with the hospitalist on call if the hospitalist that took care of you is not available.  Once you are discharged, your primary care physician will handle any further medical issues. Please note that NO REFILLS for any discharge medications will be authorized once you are discharged, as it is imperative that you return to your primary care physician (or establish a relationship with a primary care physician if you do not have one) for your  aftercare needs so that they can reassess your need for medications and monitor your lab values. You Must read complete instructions/literature along with all the possible adverse reactions/side effects for all the Medicines you take and that have been prescribed to you. Take any new Medicines after you have completely understood and accept all the possible adverse reactions/side effects. Wear Seat belts while driving. If you have smoked or chewed Tobacco in the last 2 yrs please stop smoking and/or stop any Recreational drug use.   Increase activity slowly   Complete by:  As directed      Discharge Exam:  Filed Weights   07/12/18 0739 07/16/18 0632 07/16/18 2350  Weight: 67.7 kg 67 kg 67 kg   Vitals:   07/20/18 0739 07/20/18 0800  BP: (!) 113/57   Pulse: 96   Resp: 16   Temp: 99.7 F (37.6 C)   SpO2: 98% 96%   General: Appear in mild distress, no Rash; Oral Mucosa moist. Cardiovascular: S1 and S2 Present, no Murmur, no JVD Respiratory: Bilateral Air entry present and bilateral inspiratory squeaks and crackles, no wheezes Abdomen: Bowel Sound present, Soft and no tenderness Extremities: no Pedal edema, no calf tenderness Neurology: Grossly no focal neuro deficit.  The results of significant diagnostics from this hospitalization (including imaging, microbiology, ancillary and laboratory) are listed below for reference.    Significant Diagnostic Studies: Ct Abdomen Pelvis Wo Contrast  Result Date: 07/18/2018 CLINICAL DATA:  Upper abdominal epigastric pain, vomiting, history of stage III chronic kidney disease, COPD, diabetes mellitus, hypertension, former smoker EXAM: CT ABDOMEN AND PELVIS WITHOUT CONTRAST TECHNIQUE: Multidetector CT imaging of the abdomen and pelvis was performed following the standard protocol without IV contrast. Sagittal and coronal MPR images reconstructed from axial data set. Patient drank dilute oral contrast for exam. COMPARISON:  01/03/2017 FINDINGS: Lower  chest: Small RIGHT pleural effusion. Consolidation RIGHT lower lobe. Minimal LEFT basilar atelectasis. Hepatobiliary: Gallbladder and liver normal appearance Pancreas: Normal appearance Spleen: Normal appearance Adrenals/Urinary Tract: Adrenal glands unremarkable. Nonobstructing calculus mid inferior LEFT kidney. No definite renal mass or hydronephrosis. No ureteral calcification or dilatation. Bladder unremarkable. Stomach/Bowel: Normal appendix. Stomach and bowel loops normal appearance Vascular/Lymphatic: Atherosclerotic calcifications aorta and iliac arteries without aneurysm. Scattered normal size retroperitoneal lymph nodes. No definite abdominal or pelvic adenopathy. Reproductive: Unremarkable uterus and ovaries Other: No free air or free fluid. Minimal edema within tissue planes in the pelvis versus previous exam though no specific/localized acute inflammatory process is identified. Musculoskeletal: Osseous demineralization. Lumbar fusion L4-L5 with minimal anterolisthesis. Facet degenerative changes lower lumbar spine. No acute bony findings. IMPRESSION: Small RIGHT pleural effusion with mild consolidation in RIGHT lower lobe. Minimal edema of soft tissue planes in pelvis though no specific acute inflammatory processes localized. Small nonobstructing LEFT renal calculus. Remainder of exam unremarkable. Electronically Signed   By: Lavonia Dana M.D.   On: 07/18/2018 14:21   Dg Chest 2 View  Result Date: 07/16/2018 CLINICAL DATA:  Productive cough and shortness of breath with midline chest pain. Former smoker. Currently diagnosed with right lower lobe pneumonia. EXAM: CHEST - 2 VIEW COMPARISON:  Portable chest x-ray of July 14, 2018 FINDINGS: There remains mild volume loss on the right. There is a small right pleural effusion. The left lung is well-expanded. The interstitial markings of both lungs are coarse. The heart and pulmonary vascularity are normal. The mediastinum is normal in width. The bony  thorax exhibits no acute abnormality. IMPRESSION: Persistent right lower lobe pneumonia. Small right pleural effusion. Underlying COPD. Electronically Signed   By: David  Martinique M.D.   On: 07/16/2018 10:17   Dg Chest 2 View  Result Date: 07/11/2018 CLINICAL DATA:  Nausea vomiting and headache x1 week EXAM: CHEST - 2 VIEW COMPARISON:  08/22/2017 FINDINGS: Heart size is normal. There is mild aortic atherosclerosis without aneurysm. No acute pulmonary consolidation. Mild chronic interstitial prominence and bibasilar atelectasis is identified. No overt pulmonary edema. ACDF of the lower cervical spine. No acute osseous abnormality. IMPRESSION: 1. Chronic mild interstitial lung disease and bibasilar atelectasis. 2. Minimal aortic atherosclerosis. 3. No acute pulmonary disease Electronically Signed  By: Ashley Royalty M.D.   On: 07/11/2018 18:08   Ct Chest Wo Contrast  Result Date: 07/11/2018 CLINICAL DATA:  Nausea and vomiting for 1 week. Recurrent chest pain for 6 months. Shortness of breath and dizziness with exertion. Productive cough. EXAM: CT CHEST WITHOUT CONTRAST TECHNIQUE: Multidetector CT imaging of the chest was performed following the standard protocol without IV contrast. COMPARISON:  High-resolution chest CT 09/26/2017 FINDINGS: Cardiovascular: Normal heart size. Minimal pericardial effusion. Normal caliber thoracic aorta with scattered calcifications. Mediastinum/Nodes: Mild prominence of mediastinal lymph nodes with pretracheal nodes measuring up to about 10 mm short axis dimension. Similar appearance to previous study. Likely to be reactive. Esophagus is decompressed. Thyroid gland is unremarkable. Lungs/Pleura: Scattered areas of interstitial fibrosis in the lungs. Bronchial wall thickening suggesting chronic bronchitis. Bronchiectasis with mucous plugging in the lung bases. Patchy focal areas of consolidation in the lung bases, more prominent on the right. Consolidative changes are new since  previous study suggesting interval development of an acute bronchopneumonia. No pleural effusions. No pneumothorax. Upper Abdomen: No acute changes identified. Vascular calcifications. Musculoskeletal: Degenerative changes in the thoracic spine. No destructive bone lesions. IMPRESSION: 1. Patchy areas of consolidation in the lung bases, more prominent on the right, suggesting interval development of acute bronchopneumonia. 2. Chronic bronchitic changes with bronchiectasis and mucous plugging in the lung bases. 3. Mild mediastinal lymphadenopathy, likely reactive. 4. Minimal pericardial effusion. Aortic Atherosclerosis (ICD10-I70.0). Electronically Signed   By: Lucienne Capers M.D.   On: 07/11/2018 23:55   Dg Chest Port 1v Same Day  Result Date: 07/14/2018 CLINICAL DATA:  Shortness of breath EXAM: PORTABLE CHEST 1 VIEW COMPARISON:  CT chest dated 07/11/2018 FINDINGS: Patchy right lower lobe opacity, suspicious for pneumonia. Associated trace right pleural effusion. Bilateral lower lobe bronchiectasis. No pneumothorax. The heart is normal in size. Cervical spine fixation hardware. IMPRESSION: Patchy right lower lobe opacity, suspicious for pneumonia. Associated trace right pleural effusion. Electronically Signed   By: Julian Hy M.D.   On: 07/14/2018 08:20    Microbiology: Recent Results (from the past 240 hour(s))  Urine culture     Status: Abnormal   Collection Time: 07/11/18  9:32 PM  Result Value Ref Range Status   Specimen Description URINE, RANDOM  Final   Special Requests NONE  Final   Culture (A)  Final    <10,000 COLONIES/mL INSIGNIFICANT GROWTH Performed at [facility], [address] [place], [place] [address]    Report Status 07/13/2018 FINAL  Final  Blood Culture (routine x 2)     Status: None   Collection Time: 07/11/18 10:19 PM  Result Value Ref Range Status   Specimen Description BLOOD RIGHT FOREARM  Final   Special Requests   Final    BOTTLES DRAWN AEROBIC AND  ANAEROBIC Blood Culture adequate volume   Culture   Final    NO GROWTH 5 DAYS Performed at [facility], [place] [address], [place], [place] [address]    Report Status 07/16/2018 FINAL  Final  Blood Culture (routine x 2)     Status: None   Collection Time: 07/11/18 10:19 PM  Result Value Ref Range Status   Specimen Description BLOOD LEFT ANTECUBITAL  Final   Special Requests   Final    BOTTLES DRAWN AEROBIC AND ANAEROBIC Blood Culture results may not be optimal due to an inadequate volume of blood received in culture bottles   Culture   Final    NO GROWTH 5 DAYS Performed at  [facility], [place] [address], [place], [place] [address]    Report Status 07/16/2018 FINAL  Final     Labs: CBC: Recent Labs  Lab 07/15/18 0247 07/16/18 0329 07/18/18 0341 07/19/18 0807 07/20/18 0121  WBC 31.0* 27.4* 30.2* 26.7* 27.2*  NEUTROABS 6.5 4.9 4.5 5.3 5.2  HGB 10.8* 11.4* 10.7* 11.2* 10.0*  HCT 32.8* 35.7* 32.8* 34.8* 30.9*  MCV 93.7 92.7 93.4 93.5 93.6  PLT 368 378 356 382 299   Basic Metabolic Panel: Recent Labs  Lab 07/14/18 0256 07/15/18 0247 07/16/18 0329 07/18/18 0341 07/20/18 0121  NA 139 137 135 136 138  K 4.0 4.0 3.7 3.7 3.6  CL 105 102 101 103 103  CO2 23 25 24 27 27   GLUCOSE 124* 120* 97 98 90  BUN 11 8 9  6* 9  CREATININE 1.33* 1.25* 1.29* 1.32* 1.38*  CALCIUM 8.4* 8.3* 8.4* 8.4* 8.3*  MG  --   --   --  1.8 1.7   Liver Function Tests: Recent Labs  Lab 07/18/18 0341 07/20/18 0121  AST 29 31  ALT 20 20  ALKPHOS 88 90  BILITOT 0.5 0.6  PROT 6.3* 6.2*  ALBUMIN 2.4* 2.4*   Recent Labs  Lab 07/18/18 0859  LIPASE 37   No results for input(s): AMMONIA in the last 168 hours. Cardiac Enzymes: No results for input(s): CKTOTAL, CKMB, CKMBINDEX, TROPONINI in the last 168 hours. BNP (last 3 results) No results for input(s): BNP in the last 8760 hours. CBG: No results for input(s): GLUCAP in the last 168 hours. Time spent: 35 minutes  Signed:  Berle Mull  [facility]  07/20/2018  , 4:58 PM

## 2018-07-20 NOTE — Progress Notes (Signed)
Discharge instructions reviewed with patient.  These included, but were not limited to, the following:  Prescriptions, follow-up appts, explanation of discharge planning, activity, when to call the MD, etc.  Patient's brother to pick up in private vehicle.

## 2018-07-20 NOTE — Progress Notes (Signed)
Pharmacy Antibiotic Note  Desiree [name] is a 67 y.o. female admitted on 07/11/2018 with SOB and cough.  Vancomycin and cefepime discontinued after doses given today. Transitioning to oral antibiotic. Pharmacy consulted to dose Levofloxacin for HCAP.    Today is day #6 of broader abx coverage (day #9 of total abx)  Plan: Levaquin 500mg  PO q24h Consider stopping Levaquin on 9/30?  Height: 5' 2.01" (157.5 cm) Weight: 147 lb 11.3 oz (67 kg) IBW/kg (Calculated) : 50.12  Temp (24hrs), Avg:99.1 F (37.3 C), Min:98.1 F (36.7 C), Max:100.3 F (37.9 C)  Recent Labs  Lab 07/14/18 0256 07/15/18 0247 07/16/18 0329 07/18/18 0341 07/18/18 0859 07/19/18 0807 07/20/18 0121  WBC 29.5* 31.0* 27.4* 30.2*  --  26.7* 27.2*  CREATININE 1.33* 1.25* 1.29* 1.32*  --   --  1.38*  LATICACIDVEN  --   --   --   --  1.0  --   --     Estimated Creatinine Clearance: 36 mL/min (A) (by C-G formula based on SCr of 1.38 mg/dL (H)).    Allergies  Allergen Reactions  . Prednisone Shortness Of Breath, Itching, Nausea And Vomiting and Palpitations  . Solu-Medrol [Methylprednisolone] Anaphylaxis  . Amoxicillin Hives and Rash    Has patient had a PCN reaction causing immediate rash, facial/tongue/throat swelling, SOB or lightheadedness with hypotension:Yes Has patient had a PCN reaction causing severe rash involving mucus membranes or skin necrosis: NO Has patient had a PCN reaction that required hospitalization NO Has patient had a PCN reaction occurring within the last 10 years: NO If all of the above answers are "NO", then may proceed with Cephalosporin use.   . Codeine Hives, Itching and Rash  . Hydrocodone-Acetaminophen Hives, Itching and Rash  . Penicillins Hives, Itching and Rash    ALLERGIC REACTION TO ORAL AMOXICILLIN Has patient had a PCN reaction causing immediate rash, facial/tongue/throat swelling, SOB or lightheadedness with hypotension: Yes Has patient had a PCN reaction causing severe rash  involving mucus membranes or skin necrosis: No Has patient had a PCN reaction that required hospitalization: No Has patient had a PCN reaction occurring within the last 10 years: No If all of the above answers are "NO", then may proceed with Cephalosporin use.    Antimicrobials this admission: Levaquin x1 9/19 azith 9/20 >> 9/23 Ceftriaxone 9/20 >> 9/23 vancomycin 9/23 >> 9/25 cefepime 9/23 >> 9/25 Levaquin 9/26>>  Dose adjustments this admission:   Microbiology results: 9/19 BCx: negative/final 9/19 UCx: insignificant growth 10/19   Thank you for allowing pharmacy to be a part of this patient's care.  Smith Mince, PharmD, BCPS Clinical Pharmacist Phone number [phone] 07/20/2018 9:01 AM

## 2018-07-20 NOTE — Discharge Instructions (Signed)
Coccidioidomycosis is an infection that is caused by breathing in the spores of a certain type of fungus. Spores are single-cell units of funguses that can grow and produce more funguses. The fungus that causes this infection lives in the soil of regions that have a dry and windy climate. These regions include the [place], central [place], [address], and parts of [place], [place], and [place]. The infection is usually mild and goes away without treatment (primary coccidioidomycosis). In some cases, the infection may be severe. It can cause lung damage and spread to other areas of the body (progressive coccidioidomycosis). A progressive infection may spread from the lungs to the brain, spinal cord, skin, bones, or joints. What are the causes? This condition is caused by spores of a fungus that is called Coccidioides immitis. You can breathe in these spores when they are picked up by the wind. In your lungs, these spores may grow, break open, and release tiny spores (endospores) that can spread through your body. It is very common to be exposed to the spores of the fungus. It is not common to become infected. This infection does not spread from person to person (is not contagious). What increases the risk? This condition is more likely to develop in:  People who live or travel in areas where the fungus is common.  People who are 2 years of age or older.  People who work on a farm or work with livestock.  Pregnant women.  People who have diabetes.  People of African-American, [place], or American [place] descent.  A progressive infection is more likely to develop in people with a weakened body defense system (immune system). This may include:  People who have HIV (human immunodeficiency virus) or AIDS (acquired immunodeficiency syndrome).  People who have had an organ transplant.  People who take certain medicines, such as steroids or cancer  drugs.  What are the signs or symptoms? In many cases, there are no symptoms. If symptoms develop, they can vary depending on the severity of the condition. Symptoms of Primary Infection These symptoms may develop 1-3 weeks after breathing in the fungal spores.  Fever.  Chills.  Cough.  Shortness of breath.  Headache.  Night sweats.  Muscle aches.  Skin rash.  Symptoms of Progressive Infection These symptoms may develop months or years after a primary infection.  Fever.  Trouble breathing.  Swollen and painful joints.  Loss of appetite.  Weight loss.  Weakness.  A skin infection that drains fluid.  Headache and stiff neck.  Confusion.  Double vision.  Clumsiness.  How is this diagnosed? This condition may be diagnosed based on:  Your symptoms and medical history.  A physical exam.  Various tests, such as: ? A blood test to check for an immune system reaction to the fungus or to see if the fungus will grow in a lab (culture test). ? Removing a tissue sample to examine under a microscope (biopsy) or to culture in a lab. ? Chest X-rays or a CT scan to look for signs of lung infection.  How is this treated? Treatment for this condition depends on the severity of the infection.  For a primary infection, most people do not need treatment. If you have serious symptoms or if you are at risk for a progressive infection, you may be given oral antifungal medicine.  For a progressive infection, treatment will include antifungal medicine. You will likely need to take this medicine for longer than 6 months.  In some cases, the medicine may need to be given in the hospital through an IV tube that is inserted into a vein. After the hospital stay, you will need to take oral antifungal medicine at home. If you have a weakened immune system, you may need to take this medicine for the rest of your life.  Follow these instructions at home:  Rest at home until your fever  goes away.  Return to your normal activities as told by your health care provider.  Take over-the-counter and prescription medicines only as told by your health care provider.  If you were prescribed an antifungal medicine, take it as told by your health care provider. Do not stop taking the antifungal even if you start to feel better.  Keep all follow-up visits as told by your health care provider. This is important. Contact a health care provider if:  Your symptoms are not getting better.  You develop symptoms of progressive coccidioidomycosis. Get help right away if:  You have chest pain.  You have trouble breathing.  You cough up blood.  You develop a headache along with a stiff neck.  You have confusion. This information is not intended to replace advice given to you by your health care provider. Make sure you discuss any questions you have with your health care provider. Document Released: 09/29/2002 Document Revised: 03/16/2016 Document Reviewed: 01/20/2015 Elsevier Interactive Patient Education  2018 [employer].  Coccidioidomycosis Coccidioidomycosis is an infection that is caused by breathing in the spores of a certain type of fungus. Spores are single-cell units of funguses that can grow and produce more funguses. The fungus that causes this infection lives in the soil of regions that have a dry and windy climate. These regions include the [place], central [place], [address], and parts of [place], [place], and [place]. The infection is usually mild and goes away without treatment (primary coccidioidomycosis). In some cases, the infection may be severe. It can cause lung damage and spread to other areas of the body (progressive coccidioidomycosis). A progressive infection may spread from the lungs to the brain, spinal cord, skin, bones, or joints. What are the causes? This condition is caused by spores of a fungus that is  called Coccidioides immitis. You can breathe in these spores when they are picked up by the wind. In your lungs, these spores may grow, break open, and release tiny spores (endospores) that can spread through your body. It is very common to be exposed to the spores of the fungus. It is not common to become infected. This infection does not spread from person to person (is not contagious). What increases the risk? This condition is more likely to develop in:  People who live or travel in areas where the fungus is common.  People who are 32 years of age or older.  People who work on a farm or work with livestock.  Pregnant women.  People who have diabetes.  People of African-American, [place], or American [place] descent.  A progressive infection is more likely to develop in people with a weakened body defense system (immune system). This may include:  People who have HIV (human immunodeficiency virus) or AIDS (acquired immunodeficiency syndrome).  People who have had an organ transplant.  People who take certain medicines, such as steroids or cancer drugs.  What are the signs or symptoms? In many cases, there are no symptoms. If symptoms develop, they can vary depending on the severity of the condition. Symptoms  of Primary Infection These symptoms may develop 1-3 weeks after breathing in the fungal spores.  Fever.  Chills.  Cough.  Shortness of breath.  Headache.  Night sweats.  Muscle aches.  Skin rash.  Symptoms of Progressive Infection These symptoms may develop months or years after a primary infection.  Fever.  Trouble breathing.  Swollen and painful joints.  Loss of appetite.  Weight loss.  Weakness.  A skin infection that drains fluid.  Headache and stiff neck.  Confusion.  Double vision.  Clumsiness.  How is this diagnosed? This condition may be diagnosed based on:  Your symptoms and medical history.  A physical exam.  Various tests,  such as: ? A blood test to check for an immune system reaction to the fungus or to see if the fungus will grow in a lab (culture test). ? Removing a tissue sample to examine under a microscope (biopsy) or to culture in a lab. ? Chest X-rays or a CT scan to look for signs of lung infection.  How is this treated? Treatment for this condition depends on the severity of the infection.  For a primary infection, most people do not need treatment. If you have serious symptoms or if you are at risk for a progressive infection, you may be given oral antifungal medicine.  For a progressive infection, treatment will include antifungal medicine. You will likely need to take this medicine for longer than 6 months. In some cases, the medicine may need to be given in the hospital through an IV tube that is inserted into a vein. After the hospital stay, you will need to take oral antifungal medicine at home. If you have a weakened immune system, you may need to take this medicine for the rest of your life.  Follow these instructions at home:  Rest at home until your fever goes away.  Return to your normal activities as told by your health care provider.  Take over-the-counter and prescription medicines only as told by your health care provider.  If you were prescribed an antifungal medicine, take it as told by your health care provider. Do not stop taking the antifungal even if you start to feel better.  Keep all follow-up visits as told by your health care provider. This is important. Contact a health care provider if:  Your symptoms are not getting better.  You develop symptoms of progressive coccidioidomycosis. Get help right away if:  You have chest pain.  You have trouble breathing.  You cough up blood.  You develop a headache along with a stiff neck.  You have confusion. This information is not intended to replace advice given to you by your health care provider. Make sure you discuss  any questions you have with your health care provider. Document Released: 09/29/2002 Document Revised: 03/16/2016 Document Reviewed: 01/20/2015 Elsevier Interactive Patient Education  2018 [employer].  Coccidioidomycosis Coccidioidomycosis is an infection that is caused by breathing in the spores of a certain type of fungus. Spores are single-cell units of funguses that can grow and produce more funguses. The fungus that causes this infection lives in the soil of regions that have a dry and windy climate. These regions include the [place], central [place], [address], and parts of [place], [place], and [place]. The infection is usually mild and goes away without treatment (primary coccidioidomycosis). In some cases, the infection may be severe. It can cause lung damage and spread to other areas of the body (progressive coccidioidomycosis).  A progressive infection may spread from the lungs to the brain, spinal cord, skin, bones, or joints. What are the causes? This condition is caused by spores of a fungus that is called Coccidioides immitis. You can breathe in these spores when they are picked up by the wind. In your lungs, these spores may grow, break open, and release tiny spores (endospores) that can spread through your body. It is very common to be exposed to the spores of the fungus. It is not common to become infected. This infection does not spread from person to person (is not contagious). What increases the risk? This condition is more likely to develop in:  People who live or travel in areas where the fungus is common.  People who are 22 years of age or older.  People who work on a farm or work with livestock.  Pregnant women.  People who have diabetes.  People of African-American, [place], or American [place] descent.  A progressive infection is more likely to develop in people with a weakened body defense system (immune system).  This may include:  People who have HIV (human immunodeficiency virus) or AIDS (acquired immunodeficiency syndrome).  People who have had an organ transplant.  People who take certain medicines, such as steroids or cancer drugs.  What are the signs or symptoms? In many cases, there are no symptoms. If symptoms develop, they can vary depending on the severity of the condition. Symptoms of Primary Infection These symptoms may develop 1-3 weeks after breathing in the fungal spores.  Fever.  Chills.  Cough.  Shortness of breath.  Headache.  Night sweats.  Muscle aches.  Skin rash.  Symptoms of Progressive Infection These symptoms may develop months or years after a primary infection.  Fever.  Trouble breathing.  Swollen and painful joints.  Loss of appetite.  Weight loss.  Weakness.  A skin infection that drains fluid.  Headache and stiff neck.  Confusion.  Double vision.  Clumsiness.  How is this diagnosed? This condition may be diagnosed based on:  Your symptoms and medical history.  A physical exam.  Various tests, such as: ? A blood test to check for an immune system reaction to the fungus or to see if the fungus will grow in a lab (culture test). ? Removing a tissue sample to examine under a microscope (biopsy) or to culture in a lab. ? Chest X-rays or a CT scan to look for signs of lung infection.  How is this treated? Treatment for this condition depends on the severity of the infection.  For a primary infection, most people do not need treatment. If you have serious symptoms or if you are at risk for a progressive infection, you may be given oral antifungal medicine.  For a progressive infection, treatment will include antifungal medicine. You will likely need to take this medicine for longer than 6 months. In some cases, the medicine may need to be given in the hospital through an IV tube that is inserted into a vein. After the hospital stay,  you will need to take oral antifungal medicine at home. If you have a weakened immune system, you may need to take this medicine for the rest of your life.  Follow these instructions at home:  Rest at home until your fever goes away.  Return to your normal activities as told by your health care provider.  Take over-the-counter and prescription medicines only as told by your health care provider.  If you were prescribed  an antifungal medicine, take it as told by your health care provider. Do not stop taking the antifungal even if you start to feel better.  Keep all follow-up visits as told by your health care provider. This is important. Contact a health care provider if:  Your symptoms are not getting better.  You develop symptoms of progressive coccidioidomycosis. Get help right away if:  You have chest pain.  You have trouble breathing.  You cough up blood.  You develop a headache along with a stiff neck.  You have confusion. This information is not intended to replace advice given to you by your health care provider. Make sure you discuss any questions you have with your health care provider. Document Released: 09/29/2002 Document Revised: 03/16/2016 Document Reviewed: 01/20/2015 Elsevier Interactive Patient Education  2018 [employer].

## 2018-07-22 ENCOUNTER — Other Ambulatory Visit: Payer: Self-pay | Admitting: Pharmacist

## 2018-07-22 ENCOUNTER — Other Ambulatory Visit: Payer: Self-pay

## 2018-07-22 ENCOUNTER — Other Ambulatory Visit: Payer: Self-pay | Admitting: *Deleted

## 2018-07-22 ENCOUNTER — Encounter: Payer: Self-pay | Admitting: Hematology and Oncology

## 2018-07-22 ENCOUNTER — Telehealth: Payer: Self-pay | Admitting: Hematology and Oncology

## 2018-07-22 ENCOUNTER — Telehealth: Payer: Self-pay | Admitting: Internal Medicine

## 2018-07-22 LAB — IGE: IgE (Immunoglobulin E), Serum: 1224 IU/mL — ABNORMAL HIGH (ref 6–495)

## 2018-07-22 LAB — PATHOLOGIST SMEAR REVIEW

## 2018-07-22 NOTE — Telephone Encounter (Signed)
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 Pt has been scheduled to see Dr. Pamelia Hoit on 10/16 at 1pm. Pt aware to arrive 30 minutes early. Letter mailed.

## 2018-07-22 NOTE — Patient Outreach (Signed)
[facility]) Care Management  07/22/2018  Desiree [name] Sep 27, 1951 [id number]   Referral received form hospital liaison as member was recently admitted for community acquired pneumonia from 9/19-9/28.  Primary MD office will perform transition of care assessment.   Per chart, she also has history of hypertension, COPD, diabetes, hyperlipidemia, and bipolar.    Call placed to member to follow up on discharge, no answer.  HIPAA compliant voice message left.  Unsuccessful outreach letter sent, will follow up within the next 4 business days.     Update:  Voice message received back from member.  Call placed to member again, no answer.  HIPAA compliant voice message left.  Again, will follow up within the next 4 business days.    Kemper Durie, [place], MSN [facility] Care Management  [facility] Manager [phone]

## 2018-07-22 NOTE — Patient Outreach (Signed)
[facility]) Care Management  07/22/2018  Desiree [name] 1951-04-17 [id number]  Successful outreach to the patient on today's date, HIPAA identifiers confirmed. BSW introduced self to the patient and the reason for today's call, indicating this BSW received a referral to assist the patient with food insecurities. BSW conducted a social determinants of health screening during today's call. The patient denied an inability to afford housing, bills, or food insecurities.  BSW spoke with the patient about the [facility] Well Dine program which will provide 10 meals to the patient post hospitalization. The patient is agreeable to BSW placing a referral. BSW contacted [facility] and received confirmation the patients expected meal delivery date to be 10/3. The patient is also agreeable to BSW placing a referral to the meals on wheels program. The patient is aware there is a wait list and does not indicate any distress in not receiving these meals immediately.  Plan: BSW to follow up with the patient in the next week to confirm receipt of [employer] meals. BSW will perform a case closure during the next outreach pending no other community resource needs are identified.   [name], BSW, CDP [facility] [phone]

## 2018-07-22 NOTE — Telephone Encounter (Signed)
Transition Care Management Follow-up Telephone Call   Date discharged? 07/20/18   How have you been since you were released from the hospital? "Ive been nauseous and doing some vomiting since being home."   Do you understand why you were in the hospital? Yes   Do you understand the discharge instructions? Yes   Where were you discharged to: Home   Items Reviewed:  Medications reviewed: yes  Allergies reviewed: yes  Dietary changes reviewed: yes  Referrals reviewed: no   Functional Questionnaire:   Activities of Daily Living (ADLs):   She states they are independent in the following: ambulation, bathing and hygiene, feeding, continence, grooming, toileting and dressing States they require assistance with the following: N/A   Any transportation issues/concerns?: yes - she has her own transportation, but also has her brother to drive her if needed   Any patient concerns? Not had an appetite, nauseous/vomiting,    Confirmed importance and date/time of follow-up visits scheduled Yes  Provider Appointment booked with Dr Caryl Never at 2:15 (PCP [name] out of town)  Confirmed with patient if condition begins to worsen call PCP or go to the ER.  Patient was given the office number and encouraged to call back with question or concerns.  : yes

## 2018-07-22 NOTE — Patient Outreach (Addendum)
Desiree [name] [place]) Care Management  07/22/2018  Desiree [name] Nov 13, 1950 [id number]  67 y.o. year old female referred to [facility] for medication assistance for [name] and for medication reconciliation post discharge.  Patient admitted 9/19-9/28/2019 for CAP / AECOPD, complicated by recurrent N/V + eosinophilia.  EGD showed gastritis and duodenitis, biopsies pending, referral for outpatient follow-up with hematology.  PMHx significant for diabetes mellitus type 2, renal insufficiency, osteoarthritis, BPD1, HLD, hypertrophic cardiomyopathy, GERD, hiatal hernia, and COPD.    Referral source: [name] Liaison Referral medication(s): Anoro Ellipta Current insurance:[employer] Currently receiving Extra Help:  [x] Yes [] No [] Unknown  Subjective:  Successful call placed to [name] today.  HIPAA identifiers verified. Patient agreeable to medication reconciliation.  She reports that she already completed her 2 day course of levaquin.   She has not been able to pick up the Carafate or Ensure yet but states she was given several doses of Carafate from the [name].  She reports she is familiar with and comfortable using her nebulizer machine.  She states she has been using the Anoro inhaler for several months and thinks that she has pays ~$65 / month.  She also reports that she ran out of refills on her test strips about 1 month ago and therefore has not been checking her CBGs.   Objective: Hemoglobin A1C = 5.9 (3/6/'19) SCr = 1.38 (06/30/2018)   Medications Reviewed Today    Reviewed by Tomi Likens (Technician) on 07/22/18 at [place] List Status: <None>  Medication Order Taking? Sig Documenting Provider Last Dose Status Informant  acetaminophen (TYLENOL) 500 MG tablet [id number] No Take 2 tablets (1,000 mg total) by mouth every 6 (six) hours as needed.  Patient taking differently:  Take 1,000 mg by mouth every 6 (six) hours as needed for mild pain or headache.     [name], MD [name] Active Self  atorvastatin (LIPITOR) 20 MG tablet [id number] No TAKE 1 TABLET BY MOUTH ONCE DAILY [name], [name], MD 07/10/2018 pm Active Self  Blood Glucose Monitoring Suppl (ACCU-CHEK NANO SMARTVIEW) W/DEVICE KIT [id number] No Use to check blood sugar 1-2 times daily  Patient taking differently:  1 strip by Other route 2 (two) times a week.    [name], [name], MD [name] Active Self  cetirizine (ZYRTEC) 10 MG tablet [id number] No Take 10 mg by mouth at bedtime.  [provider] 07/10/2018 pm Active Self  diclofenac sodium (VOLTAREN) 1 % GEL [id number] No Apply 3 grams to 3 large joints, up to 3 times daily.  Patient taking differently:  Apply 3 g topically See admin instructions. Apply 3 grams to 3 large joints, up to 3 times daily   Bo Merino, MD 07/11/2018 Unknown time Active Self  docusate sodium (COLACE) 100 MG capsule [id number] No Take 100 mg by mouth at bedtime.  [provider] 07/10/2018 pm Active Self  DULoxetine (CYMBALTA) 60 MG capsule [id number] No Take 1 capsule (60 mg total) by mouth daily.  Patient taking differently:  Take 60 mg by mouth 2 (two) times daily.    [name], MD 07/10/2018 Unknown time Active Self  feeding supplement, ENSURE ENLIVE, (ENSURE ENLIVE) LIQD [id number]  Take 237 mLs by mouth 3 (three) times daily between meals. [name], MD  Active   fluticasone [name]) 50 MCG/ACT nasal spray [id number] No Place 1-2 sprays into both nostrils at bedtime.  [provider] 07/10/2018 pm Active Self  glucose blood test strip  [id number] No Accu-chek nano, test glucose once daily, dx E11.9 [name], [name], MD Unk Unk Active Self  guaiFENesin (MUCINEX) 600 MG 12 hr tablet [id number] No Take 600 mg by mouth at bedtime. [provider] 07/10/2018 pm Active Self  ipratropium-albuterol (DUONEB) 0.5-2.5 (3) MG/3ML SOLN [id number] No INHALE CONTENTS OF 1 VIAL THREE TIMES A DAY IN NEBULIZER  Patient taking  differently:  Take 3 mLs by nebulization 3 (three) times daily.    Desiree Doom, MD 07/10/2018 Unknown time Active Self  lamoTRIgine (LAMICTAL) 25 MG tablet [id number] No Take 1 tablet (25 mg total) by mouth 2 (two) times daily. [name], PA-C 07/10/2018 pm Active Self  Lancets Misc. (ACCU-CHEK FASTCLIX LANCET) KIT [id number] No USE TO CHECK BLOOD SUGAR 1-2 TIMES DAILY [name], [name], MD Unk Unk Active Self  levofloxacin (LEVAQUIN) 500 MG tablet [id number]  Take 1 tablet (500 mg total) by mouth daily for 2 days. [name], MD  Active   linaclotide [name]) 145 MCG CAPS capsule [id number] No Take 145 mcg by mouth daily before breakfast. [provider] 07/11/2018 Unknown time Active Self  losartan (COZAAR) 50 MG tablet [id number] No TAKE 1 TABLET BY MOUTH ONCE DAILY [name], [name], MD 07/10/2018 Unknown time Active Self  montelukast (SINGULAIR) 10 MG tablet [id number] No Take 1 tablet (10 mg total) at bedtime by mouth. Desiree Doom, MD 07/10/2018 pm Active Self  Multiple Vitamins-Minerals (CENTRUM SILVER PO) [id number] No Take 1 tablet by mouth daily.  [provider] 07/11/2018 Unknown time Active Self           Med Note Desiree [name] Dec 05, 2016 10:31 PM)    Desiree [name] [id number] No FDgard: Take 1 capsule by mouth at bedtime [provider] 07/10/2018 pm Active Self  NON FORMULARY [id number] No Respivest: Three times a day for 30 minutes [provider] 07/10/2018 Unknown time Active Self  ondansetron (ZOFRAN-ODT) 4 MG disintegrating tablet [id number] No Take 1 tablet (4 mg total) by mouth every 8 (eight) hours as needed for nausea or vomiting. [name], [name], MD [name] Active Self  pantoprazole (PROTONIX) 40 MG tablet [id number] No Take 40 mg by mouth daily. [provider] 07/11/2018 Unknown time Active Self  Peppermint Oil (IBGARD PO) [id number] No Take 1 capsule by mouth every morning. [provider] 07/10/2018 Unknown time  Active Self  Probiotic Product (ALIGN PO) [id number] No Take 1 capsule by mouth daily with breakfast.  [provider] 07/10/2018 am Active Self  Respiratory Therapy Supplies (FLUTTER) DEVI [id number] No 1 Device by Does not apply route as directed.  Patient taking differently:  1 Device as directed.    Parrett, Tammy S, NP 07/10/2018 Unknown time Active Self  sodium chloride HYPERTONIC 3 % nebulizer solution [id number] No Take by nebulization 2 (two) times daily.  Patient taking differently:  Take 4 mLs by nebulization 2 (two) times daily.    Desiree Doom, MD 07/10/2018 Unknown time Active Self  sucralfate (CARAFATE) 1 GM/10ML suspension [id number]  Take 10 mLs (1 g total) by mouth 2 (two) times daily. [name], MD  Active   umeclidinium-vilanterol [name] ELLIPTA) 62.5-25 MCG/INH AEPB [id number] No Inhale 1 puff into the lungs daily. Desiree Doom, MD 07/09/2018 Unknown time Active Self          ASSESSMENT: Date Discharged from [name]: 07/20/2018 Date Medication Reconciliation Performed: 07/22/2018  Medications Discontinued at Discharge:  None  New Medications at Discharge:  Levaquin  Carafate  Ensure Enlive  Patient was recently discharged from [name] and all medications have been reviewed   Drugs sorted by system:  Neurologic/Psychologic:duloxetine, lamictal  Cardiovascular: atorvastatin, losartan  Pulmonary/Allergy: cetirizine, fluticasone NS, guaifenesin, ipratropium-albuterol nebs, sodium chloride nebs, montelukast, umeclidinium-vilanterol  Gastrointestinal:docusate, ensure enlive, linaclotide, sucralfate  Topical: diclofenac gel  Pain: acetaminophen  Vitamins/Minerals: MVI  Infectious Diseases: levofloxacin  Medication Review Findings:  -Not testing CBGs as patient ran out of test strips -Has not picked up sucralfate or ensure enlive yet -Completed antibiotic course -Allergy noted to steroids but tolerates fluticasone  NS  Medication Assistance Findings:  Extra Help:   [x] Already receiving (Partial Extra Help 75%)  [] Eligible based on reported income  [] Not Eligible based on reported income  Patient Assistance Programs: 1) Anoro Ellipta made by [place]  o Income requirement met: [x] Yes [] No o Troop requirement met: [] Yes [x] No  - Patient only spent $160 through June.  She will confirm TROOP with [facility] later this week when she picks up medications.  If close to or over $600, patient will contact [facility] for PAP application assistance.  Patient states she is able to afford co-pay right now.     Additional medication assistance options reviewed with patient as warranted including:  . Insurance OTC catalogue  PLAN:   -Instructed patient to take new medications as prescribed -Instructed patient to request refill for test strips during office visit with PCP tomorrow -Instructed patient to check TROOP and contact me once she is near ~$600.  Provided my contact information to patient.    I will close pharmacy case at this time.  Thank you for allowing [name] pharmacy to be involved in this patient's care.     Ralene Bathe, PharmD, [place] [phone]

## 2018-07-23 ENCOUNTER — Other Ambulatory Visit: Payer: Self-pay

## 2018-07-23 ENCOUNTER — Encounter: Payer: Self-pay | Admitting: Family Medicine

## 2018-07-23 ENCOUNTER — Ambulatory Visit (INDEPENDENT_AMBULATORY_CARE_PROVIDER_SITE_OTHER): Payer: Medicare HMO | Admitting: Family Medicine

## 2018-07-23 VITALS — BP 100/60 | HR 119 | Temp 99.1°F | Ht 62.0 in | Wt 144.2 lb

## 2018-07-23 DIAGNOSIS — I10 Essential (primary) hypertension: Secondary | ICD-10-CM | POA: Diagnosis not present

## 2018-07-23 DIAGNOSIS — E1122 Type 2 diabetes mellitus with diabetic chronic kidney disease: Secondary | ICD-10-CM | POA: Diagnosis not present

## 2018-07-23 DIAGNOSIS — J189 Pneumonia, unspecified organism: Secondary | ICD-10-CM | POA: Diagnosis not present

## 2018-07-23 DIAGNOSIS — D721 Eosinophilia, unspecified: Secondary | ICD-10-CM

## 2018-07-23 DIAGNOSIS — E1129 Type 2 diabetes mellitus with other diabetic kidney complication: Secondary | ICD-10-CM | POA: Diagnosis not present

## 2018-07-23 DIAGNOSIS — J479 Bronchiectasis, uncomplicated: Secondary | ICD-10-CM | POA: Diagnosis not present

## 2018-07-23 DIAGNOSIS — J449 Chronic obstructive pulmonary disease, unspecified: Secondary | ICD-10-CM | POA: Diagnosis not present

## 2018-07-23 DIAGNOSIS — R269 Unspecified abnormalities of gait and mobility: Secondary | ICD-10-CM | POA: Diagnosis not present

## 2018-07-23 LAB — ASPERGILLUS ANTIBODY BY IMMUNODIFF
Aspergillus flavus: NEGATIVE
Aspergillus fumigatus, IgG: NEGATIVE
Aspergillus niger: NEGATIVE

## 2018-07-23 LAB — STRONGYLOIDES, AB, IGG: Strongyloides, Ab, IgG: NEGATIVE

## 2018-07-23 MED ORDER — ONDANSETRON 4 MG PO TBDP: 4.0000 mg | ORAL_TABLET | Freq: Three times a day (TID) | ORAL | 0 refills | Status: DC | PRN

## 2018-07-23 NOTE — Patient Instructions (Signed)
Follow up for any fever, shortness of breath, or any recurrent vomiting.

## 2018-07-23 NOTE — Progress Notes (Signed)
Subjective:     [name], female   DOB: 1951/07/01, 67 y.o.   MRN: [id number]  HPI Patient is here for hospital follow-up.  I am seeing her in absence of her primary provider.  This is a hospital follow-up.  Patient was admitted on 07/11/2018 and discharged on the 28th.  She has chronic problems including bronchiectasis, COPD, type 2 diabetes, hypertension who presented to the ED with increased shortness of breath and cough along with some purulent sputum changes.  She has had some chronic intermittent chest pains.  She was febrile in the ED with tachycardia and tachypnea and O2 sats of 87%.  White count 27,000.  Chest x-ray showed chronic mid lung disease and bibasilar atelectasis.  She met SIRS criteria and was admitted for further evaluation and treatment  She was treated with broad-spectrum antibiotics with vancomycin and cefepime.  Blood cultures and sputum cultures were negative.  She was discharged on Levaquin and completed that course yesterday.  Previous intolerance with steroids.  Patient had leukocytosis with significant eosinophilia.  She was seen by GI and underwent EGD which did not show any eosinophilic gastritis.  She did have some mild gastritis and duodenitis.  She was discharged home on PPI and Carafate.  Strongyloides antibody was negative.  She had some recurrent nausea and vomiting.  She still has some intermittent nausea at this point.  No diarrhea.  Denies any recurrent fever.  Rare cough.  No dyspnea.  Past Medical History:  Diagnosis Date  . Anxiety   . Bipolar disorder ([place])   . CANDIDIASIS, ESOPHAGEAL 07/28/2009   Qualifier: Diagnosis of  By: [name] MD, [name]   . Chronic kidney disease    CKD III  . COPD (chronic obstructive pulmonary disease) ([place])   . Depression   . Diabetes mellitus without complication (HCC)    no meds  . FH: colonic polyps   . Fractured elbow    right   . GERD (gastroesophageal reflux disease)   . HH (hiatus hernia)    . History of carpal tunnel syndrome   . History of chest pain   . History of transfusion of packed red blood cells   . Hyperlipidemia   . Hypertension   . Neuroleptic-induced tardive dyskinesia   . Osteoarthritis of more than one site   . Seasonal allergies    Past Surgical History:  Procedure Laterality Date  . ANTERIOR CERVICAL DECOMP/DISCECTOMY FUSION  09/27/2016   C5-6 anterior cervical discectomy and fusion, allograft and plate/notes 09/27/2016  . ANTERIOR CERVICAL DECOMP/DISCECTOMY FUSION N/A 09/27/2016   Procedure: C5-6 Anterior Cervical Discectomy and Fusion, Allograft and Plate;  Surgeon: [name], MD;  Location: [facility];  Service: Orthopedics;  Laterality: N/A;  . Back Fusion  2002  . BIOPSY  07/19/2018   Procedure: BIOPSY;  Surgeon: [name], MD;  Location: [place];  Service: Endoscopy;;  . CARPAL TUNNEL RELEASE  yates   left  . COLONOSCOPY N/A 01/05/2014   Procedure: COLONOSCOPY;  Surgeon: [name], MD;  Location: [facility];  Service: Endoscopy;  Laterality: N/A;  . ELBOW SURGERY     age 30  . ESOPHAGOGASTRODUODENOSCOPY (EGD) WITH PROPOFOL N/A 07/19/2018   Procedure: ESOPHAGOGASTRODUODENOSCOPY (EGD) WITH PROPOFOL;  Surgeon: [name], MD;  Location: [facility];  Service: Endoscopy;  Laterality: N/A;  . EXTERNAL EAR SURGERY Left   . EYE SURGERY     "removed white dots under eyelid"  . FINGER SURGERY Left   . Juvara  osteomy    . KNEE SURGERY    . NOSE SURGERY    . Rt. toe bunion    . skin, shave biopsy  05/03/2016   Left occipital scalp, top of scalp    reports that she quit smoking about 16 years ago. Her smoking use included cigarettes. She has a 60.00 pack-year smoking history. She has never used smokeless tobacco. She reports that she does not drink alcohol or use drugs. family history includes Breast cancer in her cousin; Diabetes in her mother; Diabetes type II in her brother; Heart attack in her father and mother; Heart disease in her brother  and father; Hypertension in her mother; Lung cancer in her brother, daughter, and paternal uncle; Throat cancer in her brother. Allergies  Allergen Reactions  . Prednisone Shortness Of Breath, Itching, Nausea And Vomiting and Palpitations  . Solu-Medrol [Methylprednisolone] Anaphylaxis  . Amoxicillin Hives and Rash    Has patient had a PCN reaction causing immediate rash, facial/tongue/throat swelling, SOB or lightheadedness with hypotension:Yes Has patient had a PCN reaction causing severe rash involving mucus membranes or skin necrosis: NO Has patient had a PCN reaction that required hospitalization NO Has patient had a PCN reaction occurring within the last 10 years: NO If all of the above answers are "NO", then may proceed with Cephalosporin use.   . Codeine Hives, Itching and Rash  . Hydrocodone-Acetaminophen Hives, Itching and Rash  . Penicillins Hives, Itching and Rash    ALLERGIC REACTION TO ORAL AMOXICILLIN Has patient had a PCN reaction causing immediate rash, facial/tongue/throat swelling, SOB or lightheadedness with hypotension: Yes Has patient had a PCN reaction causing severe rash involving mucus membranes or skin necrosis: No Has patient had a PCN reaction that required hospitalization: No Has patient had a PCN reaction occurring within the last 10 years: No If all of the above answers are "NO", then may proceed with Cephalosporin use.     Review of Systems  Constitutional: Negative for chills and fever.  Respiratory: Negative for cough and shortness of breath.   Cardiovascular: Negative for chest pain, palpitations and leg swelling.  Gastrointestinal: Positive for nausea. Negative for abdominal pain and diarrhea.  Genitourinary: Negative for dysuria.  Neurological: Negative for dizziness.  Psychiatric/Behavioral: Negative for confusion.       Objective:   Physical Exam  Constitutional: She appears well-developed and well-nourished.  HENT:  Mouth/Throat:  Oropharynx is clear and moist.  Cardiovascular: Normal rate and regular rhythm.  Pulmonary/Chest: No respiratory distress.  She has some scattered rhonchi and a few scattered wheezes.  No rales.  No retractions.  Pulse oximetry 95%.  Abdominal: Soft. She exhibits no mass. There is no tenderness. There is no rebound.  Musculoskeletal: She exhibits no edema.       Assessment:     #1 community-acquired pneumonia in a patient with chronic bronchiectasis and COPD.  Overall improved.  She just finished antibiotic yesterday.  Cultures were negative.  #2 gastritis/duodenitis.  Patient currently on PPI and Carafate.  She is advised to stay on this until she follows up with GI  #3 type 2 diabetes  #4 chronic kidney disease stage III  #5 hypertension controlled on losartan  #6 hyperlipidemia  #7 history of bipolar disorder  #8 eosinophilia-follow-up with hematology pending    Plan:     -Patient has scheduled follow-up with multiple specialists including gastroenterology, pulmonary, hematology -Refilled Zofran 4 mg ODT every 8 hours as needed for nausea and vomiting -Follow-up immediately for any recurrent  fever, increased dyspnea, or other concerns -She received flu vaccine already through hospital  Eulas Post MD [facility] at [facility]

## 2018-07-24 ENCOUNTER — Other Ambulatory Visit: Payer: Self-pay | Admitting: *Deleted

## 2018-07-24 ENCOUNTER — Other Ambulatory Visit: Payer: Self-pay | Admitting: Pharmacist

## 2018-07-24 NOTE — Patient Outreach (Signed)
[facility]) Care Management  07/24/2018  Desiree [name] Oct 20, 1951 [id number]   Call received back from member after multiple missed calls.  Identify verified them phone disconnected.  Call placed back to member.  She report she is doing well since discharge.  Report she has been seen by her primary MD, will have appointment with lung specialist coming in the next week.  She has been in contact with [facility] pharmacist for medication assistance as well as BSW for community/financial resources.  She denies any cough or shortness of breath at this time, agrees to home visit within the next 2 weeks.  Provided with contact information for this care manager, advised to contact with questions.   [facility] CM Care Plan Problem One     Most Recent Value  Care Plan Problem One  Risk for readmission related to respiratory distress as evidenced by recent hospitalization for pneumonia  Role Documenting the Problem One  Care Management Coordinator  Care Plan for Problem One  Active  [facility] Long Term Goal   Member will not be readmitted to hospital within 31 days of discharge  [facility] Long Term Goal Start Date  07/24/18  Interventions for Problem One Long Term Goal  Discharge instructions reviewed with member.  Educated on importance of following plan of care in effort to decrease risk of readmission  [facility] CM Short Term Goal #1   Member will keep and attend follow up appointment with pulmonologist within the next 2 weeks  [facility] CM Short Term Goal #1 Start Date  07/24/18  Interventions for Short Term Goal #1  Appointment reviewed in [facility] and with member.  Confirmed she will have transportation.  [facility] CM Short Term Goal #2   Member will report taking all medications as prescribed over the next 4 weeks  [facility] CM Short Term Goal #2 Start Date  07/24/18  Interventions for Short Term Goal #2  Educated on importance of taking medications as prescribed, particularly taking antibiotics until they are gone to increase  benefit       Kemper Durie, Charity fundraiser, MSN [facility] Care Management  [facility] Manager [phone]

## 2018-07-24 NOTE — Patient Outreach (Signed)
[facility]) Care Management  07/24/2018  ALANYA VUKELICH 1951/02/02 [id number]  Incoming call received from Ms. [name] regarding medication assistance.  She has TROOP printout from local pharmacy showing [phone].  She is therefore eligible to apply for Anoro Ellipta inhaler.    Plan: I will route patient assistance letter to pharmacy technician, Lilla Shook, who will coordinate application process for Anoro Ellipta through [employer].  She will assist with obtaining all pertinent documents from both patient and Dr Max Fickle and submit application once completed.    Haynes Hoehn, PharmD, [facility] Clinical Pharmacist [facility] [employer] [phone]

## 2018-07-25 ENCOUNTER — Telehealth: Payer: Self-pay | Admitting: *Deleted

## 2018-07-25 ENCOUNTER — Other Ambulatory Visit: Payer: Self-pay

## 2018-07-25 ENCOUNTER — Other Ambulatory Visit: Payer: Self-pay | Admitting: Pharmacy Technician

## 2018-07-25 NOTE — Telephone Encounter (Signed)
Her urine cx in hospital was negative.  I really feel she should be seen to repeat urine dip (and cx- if indicated)

## 2018-07-25 NOTE — Patient Outreach (Signed)
[facility]) Care Management  07/25/2018  Desiree [name] 04-17-1951 [id number]   Received [employer] patient assistance referral ffrom [facility] RPh Colleen Summe for [employer]. Prepared patient portion to be mailed and faxed provider portion to Dr. Kendrick Fries.  Will follow up with patient in 5-7 business days to confirm application has been received.  [name] [name] [facility] Care Management [phone]

## 2018-07-25 NOTE — Telephone Encounter (Signed)
Please advise. She was treated w/ Levaquin for UTI. ROS yesterday was negative for dysuria but patient is now report burning.

## 2018-07-25 NOTE — Telephone Encounter (Signed)
Copied from CRM [id number]. Topic: Quick Communication - Rx Refill/Question >> Jul 25, 2018 10:03 AM Crist Infante wrote: Medication: Pt saw Dr Caryl Never yesterday for hosp fup.  Pt states she was treated with abx in the hospital for UTI.  But it has not gone away. Pt still has burning with urination. Pt wanted Dr Caryl Never to refill this for her, but she threw away the bottle and does not know what it was. (she said it was a little red pill) Advised Dr Caryl Never not in the office today, pt verbalized understanding. But I would send the message to his office. [facility] [phone] - [place], [place] - [address] [phone] (Phone) [phone] (Fax)

## 2018-07-25 NOTE — Patient Outreach (Signed)
[facility]) Care Management  07/25/2018  Desiree [name] 22-Apr-1951 [id number]  Successful outreach to the patient on today's date, HIPAA identifiers confirmed. The patient indicates she has received meals from [facility] Well Dine program. BSW spoke with the patient about to option to privately pay for meals on wheels to avoid the waiting list. The patient declined this option. BSW provided the patient with the contact information to Richard at the [employer] who manages the [employer]. BSW encouraged the patient to call today. BSW further explained that this BSW was contacted by Brett Canales who requested the patient contact Richard. The patient stated understanding.  BSW to perform a discipline closure as no other social work needs have been identified. The patient is in agreement with this plan.  [name], BSW, CDP [facility] [phone]

## 2018-07-26 ENCOUNTER — Ambulatory Visit: Payer: Medicare HMO | Admitting: Family Medicine

## 2018-07-26 ENCOUNTER — Telehealth: Payer: Self-pay | Admitting: Pulmonary Disease

## 2018-07-26 ENCOUNTER — Inpatient Hospital Stay: Payer: Medicare HMO | Admitting: Primary Care

## 2018-07-26 DIAGNOSIS — J479 Bronchiectasis, uncomplicated: Secondary | ICD-10-CM

## 2018-07-26 MED ORDER — UMECLIDINIUM-VILANTEROL 62.5-25 MCG/INH IN AEPB: 1.0000 | INHALATION_SPRAY | Freq: Every day | RESPIRATORY_TRACT | 5 refills | Status: DC

## 2018-07-26 NOTE — Addendum Note (Signed)
Addended by: Kerin Ransom on: 07/26/2018 11:16 AM   Modules accepted: Orders

## 2018-07-26 NOTE — Telephone Encounter (Signed)
Called and message for Desiree [name] making her aware MD Kendrick Fries would sign the script as soon as he was back in the office. Nothing further is needed at this time.

## 2018-07-26 NOTE — Telephone Encounter (Signed)
Pt notified of results/instructions and verbalized understanding. Appt scheduled today w/ Dr. Salomon Fick.

## 2018-07-29 ENCOUNTER — Other Ambulatory Visit: Payer: Self-pay

## 2018-07-29 ENCOUNTER — Ambulatory Visit (HOSPITAL_COMMUNITY): Payer: Medicare HMO | Attending: Cardiovascular Disease

## 2018-07-29 DIAGNOSIS — I340 Nonrheumatic mitral (valve) insufficiency: Secondary | ICD-10-CM | POA: Insufficient documentation

## 2018-07-29 DIAGNOSIS — E785 Hyperlipidemia, unspecified: Secondary | ICD-10-CM | POA: Insufficient documentation

## 2018-07-29 DIAGNOSIS — J449 Chronic obstructive pulmonary disease, unspecified: Secondary | ICD-10-CM | POA: Diagnosis not present

## 2018-07-29 DIAGNOSIS — N189 Chronic kidney disease, unspecified: Secondary | ICD-10-CM | POA: Insufficient documentation

## 2018-07-29 DIAGNOSIS — I421 Obstructive hypertrophic cardiomyopathy: Secondary | ICD-10-CM | POA: Diagnosis not present

## 2018-07-29 DIAGNOSIS — F419 Anxiety disorder, unspecified: Secondary | ICD-10-CM | POA: Diagnosis not present

## 2018-07-29 DIAGNOSIS — F319 Bipolar disorder, unspecified: Secondary | ICD-10-CM | POA: Diagnosis not present

## 2018-07-29 DIAGNOSIS — J479 Bronchiectasis, uncomplicated: Secondary | ICD-10-CM | POA: Diagnosis not present

## 2018-07-29 DIAGNOSIS — I10 Essential (primary) hypertension: Secondary | ICD-10-CM

## 2018-07-29 DIAGNOSIS — I129 Hypertensive chronic kidney disease with stage 1 through stage 4 chronic kidney disease, or unspecified chronic kidney disease: Secondary | ICD-10-CM | POA: Diagnosis not present

## 2018-07-29 DIAGNOSIS — E1122 Type 2 diabetes mellitus with diabetic chronic kidney disease: Secondary | ICD-10-CM | POA: Insufficient documentation

## 2018-07-30 ENCOUNTER — Other Ambulatory Visit: Payer: Medicare HMO

## 2018-07-30 ENCOUNTER — Other Ambulatory Visit (INDEPENDENT_AMBULATORY_CARE_PROVIDER_SITE_OTHER): Payer: Medicare HMO

## 2018-07-30 ENCOUNTER — Telehealth: Payer: Self-pay | Admitting: Primary Care

## 2018-07-30 ENCOUNTER — Encounter: Payer: Self-pay | Admitting: Primary Care

## 2018-07-30 ENCOUNTER — Ambulatory Visit (INDEPENDENT_AMBULATORY_CARE_PROVIDER_SITE_OTHER)
Admission: RE | Admit: 2018-07-30 | Discharge: 2018-07-30 | Disposition: A | Discharge status: Home / Self Care | Payer: Medicare HMO | Source: Ambulatory Visit | Attending: Primary Care | Admitting: Primary Care

## 2018-07-30 ENCOUNTER — Ambulatory Visit: Payer: Medicare HMO | Admitting: Primary Care

## 2018-07-30 VITALS — BP 102/50 | HR 104 | Temp 98.7°F | Ht 63.0 in | Wt 141.6 lb

## 2018-07-30 DIAGNOSIS — J479 Bronchiectasis, uncomplicated: Secondary | ICD-10-CM

## 2018-07-30 DIAGNOSIS — D729 Disorder of white blood cells, unspecified: Secondary | ICD-10-CM

## 2018-07-30 DIAGNOSIS — J189 Pneumonia, unspecified organism: Secondary | ICD-10-CM

## 2018-07-30 DIAGNOSIS — D721 Eosinophilia, unspecified: Secondary | ICD-10-CM

## 2018-07-30 DIAGNOSIS — D72829 Elevated white blood cell count, unspecified: Secondary | ICD-10-CM

## 2018-07-30 LAB — BASIC METABOLIC PANEL
BUN: 16 mg/dL (ref 6–23)
CO2: 31 mEq/L (ref 19–32)
Calcium: 8.7 mg/dL (ref 8.4–10.5)
Chloride: 96 mEq/L (ref 96–112)
Creatinine, Ser: 1.45 mg/dL — ABNORMAL HIGH (ref 0.40–1.20)
GFR: 46.42 mL/min — ABNORMAL LOW (ref >60.00)
Glucose, Bld: 95 mg/dL (ref 70–99)
Potassium: 4.3 mEq/L (ref 3.5–5.1)
Sodium: 134 mEq/L — ABNORMAL LOW (ref 135–145)

## 2018-07-30 LAB — CBC WITH DIFFERENTIAL/PLATELET
Basophils Absolute: 0.2 10*3/uL — ABNORMAL HIGH (ref 0.0–0.1)
Basophils Relative: 0.5 % (ref 0.0–3.0)
Eosinophils Absolute: 33.7 10*3/uL — ABNORMAL HIGH (ref 0.0–0.7)
Eosinophils Relative: 76.1 % — ABNORMAL HIGH (ref 0.0–5.0)
HCT: 33.2 % — ABNORMAL LOW (ref 36.0–46.0)
Hemoglobin: 10.8 g/dL — ABNORMAL LOW (ref 12.0–15.0)
Lymphocytes Relative: 6.7 % — ABNORMAL LOW (ref 12.0–46.0)
Lymphs Abs: 3 10*3/uL (ref 0.7–4.0)
MCHC: 32.5 g/dL (ref 30.0–36.0)
MCV: 90.8 fl (ref 78.0–100.0)
Monocytes Absolute: 1 10*3/uL (ref 0.1–1.0)
Monocytes Relative: 2.3 % — ABNORMAL LOW (ref 3.0–12.0)
Neutro Abs: 6.4 10*3/uL (ref 1.4–7.7)
Neutrophils Relative %: 14.4 % — ABNORMAL LOW (ref 43.0–77.0)
Platelets: 474 10*3/uL — ABNORMAL HIGH (ref 150.0–400.0)
RBC: 3.66 Mil/uL — ABNORMAL LOW (ref 3.87–5.11)
RDW: 15.3 % (ref 11.5–15.5)
WBC: 44.2 10*3/uL (ref 4.0–10.5)

## 2018-07-30 NOTE — Assessment & Plan Note (Addendum)
-   BP 102/50 - Instructed patient to hold Cozaar 50mg  today - Check BP at home, restart 1/2 tab if BP >130/80 (pt has machine at home)

## 2018-07-30 NOTE — Patient Instructions (Addendum)
Blood pressure: BP 102/50   Hold Losartan potassium (Cozaar) today Check BP daily, if >130/80 restart Cozaar 1/2 tab daily (25mg )  Recommendations:  Hypertonic saline nebulizer twice a day Albuterol nebulizer every 6 hours for wheezing/sob Encourcage oral fluids Take tylenol 650mg  every 4-6 hours as needed for fever  Continue flutter valve, mucinex and therapy vest as prescribed    Testing: Labs and CXR today  Follow-up: FU in 1-2 weeks with Dr. or NP GI regarding nausea/vomiting     Chronic Obstructive Pulmonary Disease Chronic obstructive pulmonary disease (COPD) is a long-term (chronic) condition that affects the lungs. COPD is a general term that can be used to describe many different lung problems that cause lung swelling (inflammation) and limit airflow, including chronic bronchitis and emphysema. If you have COPD, your lung function will probably never return to normal. In most cases, it gets worse over time. However, there are steps you can take to slow the progression of the disease and improve your quality of life. What are the causes? This condition may be caused by:  Smoking. This is the most common cause.  Certain genes passed down through families.  What increases the risk? The following factors may make you more likely to develop this condition:  Secondhand smoke from cigarettes, pipes, or cigars.  Exposure to chemicals and other irritants such as fumes and dust in the work environment.  Chronic lung conditions or infections.  What are the signs or symptoms? Symptoms of this condition include:  Shortness of breath, especially during physical activity.  Chronic cough with a large amount of thick mucus. Sometimes the cough may not have any mucus (dry cough).  Wheezing.  Rapid breaths.  Gray or bluish discoloration (cyanosis) of the skin, especially in your fingers, toes, or lips.  Feeling tired (fatigue).  Weight loss.  Chest  tightness.  Frequent infections.  Episodes when breathing symptoms become much worse (exacerbations).  Swelling in the ankles, feet, or legs. This may occur in later stages of the disease.  How is this diagnosed? This condition is diagnosed based on:  Your medical history.  A physical exam.  You may also have tests, including:  Lung (pulmonary) function tests. This may include a spirometry test, which measures your ability to exhale properly.  Chest X-ray.  CT scan.  Blood tests.  How is this treated? This condition may be treated with:  Medicines. These may include inhaled rescue medicines to treat acute exacerbations as well as long-term, or maintenance, medicines to prevent flare-ups of COPD. ? Bronchodilators help treat COPD by dilating the airways to allow increased airflow and make your breathing more comfortable. ? Steroids can reduce airway inflammation and help prevent exacerbations.  Smoking cessation. If you smoke, your health care provider may ask you to quit, and may also recommend therapy or replacement products to help you quit.  Pulmonary rehabilitation. This may involve working with a team of health care providers and specialists, such as respiratory, occupational, and physical therapists.  Exercise and physical activity. These are beneficial for nearly all people with COPD.  Nutrition therapy to gain weight, if you are underweight.  Oxygen. Supplemental oxygen therapy is only helpful if you have a low oxygen level in your blood (hypoxemia).  Lung surgery or transplant.  Palliative care. This is to help people with COPD feel comfortable when treatment is no longer working.  Follow these instructions at home: Medicines  Take over-the-counter and prescription medicines (inhaled or pills) only as told  by your health care provider.  Talk to your health care provider before taking any cough or allergy medicines. You may need to avoid certain medicines  that dry out your airways. Lifestyle  If you are a smoker, the most important thing that you can do is to stop smoking. Do not use any products that contain nicotine or tobacco, such as cigarettes and e-cigarettes. If you need help quitting, ask your health care provider. Continuing to smoke will cause the disease to progress faster.  Avoid exposure to things that irritate your lungs, such as smoke, chemicals, and fumes.  Stay active, but balance activity with periods of rest. Exercise and physical activity will help you maintain your ability to do things you want to do.  Learn and use relaxation techniques to manage stress and to control your breathing.  Get the right amount of sleep and get quality sleep. Most adults need 7 or more hours per night.  Eat healthy foods. Eating smaller, more frequent meals and resting before meals may help you maintain your strength. Controlled breathing Learn and use controlled breathing techniques as directed by your health care provider. Controlled breathing techniques include:  Pursed lip breathing. Start by breathing in (inhaling) through your nose for 1 second. Then, purse your lips as if you were going to whistle and breathe out (exhale) through the pursed lips for 2 seconds.  Diaphragmatic breathing. Start by putting one hand on your abdomen just above your waist. Inhale slowly through your nose. The hand on your abdomen should move out. Then purse your lips and exhale slowly. You should be able to feel the hand on your abdomen moving in as you exhale.  Controlled coughing Learn and use controlled coughing to clear mucus from your lungs. Controlled coughing is a series of short, progressive coughs. The steps of controlled coughing are: 1. Lean your head slightly forward. 2. Breathe in deeply using diaphragmatic breathing. 3. Try to hold your breath for 3 seconds. 4. Keep your mouth slightly open while coughing twice. 5. Spit any mucus out into a  tissue. 6. Rest and repeat the steps once or twice as needed.  General instructions  Make sure you receive all the vaccines that your health care provider recommends, especially the pneumococcal and influenza vaccines. Preventing infection and hospitalization is very important when you have COPD.  Use oxygen therapy and pulmonary rehabilitation if directed to by your health care provider. If you require home oxygen therapy, ask your health care provider whether you should purchase a pulse oximeter to measure your oxygen level at home.  Work with your health care provider to develop a COPD action plan. This will help you know what steps to take if your condition gets worse.  Keep other chronic health conditions under control as told by your health care provider.  Avoid extreme temperature and humidity changes.  Avoid contact with people who have an illness that spreads from person to person (is contagious), such as viral infections or pneumonia.  Keep all follow-up visits as told by your health care provider. This is important. Contact a health care provider if:  You are coughing up more mucus than usual.  There is a change in the color or thickness of your mucus.  Your breathing is more labored than usual.  Your breathing is faster than usual.  You have difficulty sleeping.  You need to use your rescue medicines or inhalers more often than expected.  You have trouble doing routine activities such as  getting dressed or walking around the house. Get help right away if:  You have shortness of breath while you are resting.  You have shortness of breath that prevents you from: ? Being able to talk. ? Performing your usual physical activities.  You have chest pain lasting longer than 5 minutes.  Your skin color is more blue (cyanotic) than usual.  You measure low oxygen saturations for longer than 5 minutes with a pulse oximeter.  You have a fever.  You feel too tired to  breathe normally. Summary  Chronic obstructive pulmonary disease (COPD) is a long-term (chronic) condition that affects the lungs.  Your lung function will probably never return to normal. In most cases, it gets worse over time. However, there are steps you can take to slow the progression of the disease and improve your quality of life.  Treatment for COPD may include taking medicines, quitting smoking, pulmonary rehabilitation, and changes to diet and exercise. As the disease progresses, you may need oxygen therapy, a lung transplant, or palliative care.  To help manage your condition, do not smoke, avoid exposure to things that irritate your lungs, stay up to date on all vaccines, and follow your health care provider's instructions for taking medicines. This information is not intended to replace advice given to you by your health care provider. Make sure you discuss any questions you have with your health care provider. Document Released: 07/19/2005 Document Revised: 11/13/2016 Document Reviewed: 11/13/2016 Elsevier Interactive Patient Education  2018 [employer].      Bronchiectasis Bronchiectasis is a condition in which the airways (bronchi) are damaged and widened. This makes it difficult for the lungs to get rid of mucus. As a result, mucus gathers in the airways, and this often leads to lung infections. Infection can cause inflammation in the airways, which may further weaken and damage the bronchi. What are the causes? Bronchiectasis may be present at birth (congenital) or may develop later in life. Sometimes there is no apparent cause. Some common causes include:  Cystic fibrosis.  Recurrent lung infections (such as pneumonia, tuberculosis, or fungal infections).  Foreign bodies or other blockages in the lungs.  Breathing in fluid, food, or other foreign objects (aspiration).  What are the signs or symptoms? Common symptoms include:  A daily cough that brings up mucus  and lasts for more than 3 weeks.  Frequent lung infections (such as pneumonia, tuberculosis, or fungal infections).  Shortness of breath and wheezing.  Weakness and fatigue.  How is this diagnosed? Various tests may be done to help diagnose bronchiectasis. Tests may include:  Chest X-rays or CT scans.  Breathing tests to help determine how your lungs are working.  Sputum cultures to check for infection.  Blood tests and other tests to check for related diseases or causes, such as cystic fibrosis.  How is this treated? Treatment varies depending on the severity of the condition. Medicines may be given to loosen the mucus to be coughed up (expectorants), to relax the muscles of the air passages (bronchodilators), or to prevent or treat infections (antibiotics). Physical therapy methods may be recommended to help clear mucus from the lungs. For severe cases, surgery may be done to remove the affected part of the lung. Follow these instructions at home:  Get plenty of rest.  Only take over-the-counter or prescription medicines as directed by your health care provider. If antibiotic medicines were prescribed, take them as directed. Finish them even if you start to feel better.  Avoid  sedatives and antihistamines unless otherwise directed by your health care provider. These medicines tend to thicken the mucus in the lungs.  Perform any breathing exercises or techniques to clear the lungs as directed by your health care provider.  Drink enough fluids to keep your urine clear or pale yellow.  Consider using a cold steam vaporizer or humidifier in your room or home to help loosen secretions.  If the cough is worse at night, try sleeping in a semi-upright position in a recliner or using a couple of pillows.  Avoid cigarette smoke and lung irritants. If you smoke, quit.  Stay inside when pollution and ozone levels are high.  Stay current with vaccinations and immunizations.  Follow up  with your health care provider as directed. Contact a health care provider if:  You cough up more thick, discolored mucus (sputum) that is yellow to green in color.  You have a fever or persistent symptoms for more than 2-3 days.  You cannot control your cough and are losing sleep. Get help right away if:  You cough up blood.  You have chest pain or increasing shortness of breath.  You have pain that is getting worse or is uncontrolled with medicines.  You have a fever and your symptoms suddenly get worse. This information is not intended to replace advice given to you by your health care provider. Make sure you discuss any questions you have with your health care provider. Document Released: 08/06/2007 Document Revised: 03/22/2016 Document Reviewed: 04/16/2013 Elsevier Interactive Patient Education  2017 [employer].

## 2018-07-30 NOTE — Assessment & Plan Note (Signed)
-   Dx CAP 09/19 - Received broad spectrum IV abx, finished oral levaquin  - CXR shows persistent but slightly improved bibasilar infiltrates, particularly on the right side

## 2018-07-30 NOTE — Assessment & Plan Note (Signed)
-   Continues flutter valve, therapy vest - Needs new supplies for nebulizer  - Encourage hypertonic saline nebulizer twice daily

## 2018-07-30 NOTE — Telephone Encounter (Signed)
Received a call from [facility] down in lab with critical labs.  Pt's WBC is high at 44.2 and pt also has 76% eosinophils.  Hope is wanting to know if Buel[facility] Manis is wanting to send the labs out for path review.  Hope can be reached at ext [phone].  Routing to [place] for her to review and advise on this for pt.

## 2018-07-30 NOTE — Telephone Encounter (Signed)
Called patient unable to reach left message to give us a call back.

## 2018-07-30 NOTE — Telephone Encounter (Signed)
Called and spoke with Hope down in lab letting her know to send pt's labs out for path.  Hope expressed understanding and stated they would send labs out.  Order has been placed for pt to be referred to hematology.   Attempted to call pt letting her know about the referral but unable to reach pt. Left message for pt to return call [phone]

## 2018-07-30 NOTE — Progress Notes (Addendum)
_0  ID: Desiree [name], female    DOB: [birth year], 67 y.o.   MRN: [id number]  Chief Complaint  Patient presents with  . Follow-up    SOB, cough with clear mucus, wheezing    Referring provider: [name], MD  HPI: 67 year old female, former smoker quit 2003. PMH COPD, bronchiectasis. Patient of Dr. Lake Bells, last seen 03/26/18. Continues Anoro and therapy vest. Recent hospital admission for CAP, treated with broad spectrum antibiotics.   Hospital course 9/19-9/28 : Admitted for increased shortness of breath. She was febrile, tachycardic and tachypenic in ED. WBC peak 31, eosinophils absolute 20.5. CXR showed chronic mid lung disease and bibasilar atelectasis. Treated with broad spectrum antibiotics; Rocephin, Zithromax, vancomycin and cefepime. BC negative. Urine negative. She also had some mild gastritis and duodenitis. EGD did not show eosinophilic gastritis. Sent home with PPI and carafate. Strongyloides and Aspergillus antibodies was negative. Outpatient recommendations, hem/onc and GI follow-up Needs repeat CBC in 1 month  Saw PCP on 07/23/18 for hosp follow-up. BP 100/60, HR 119. Recurrent nausea and vomiting. No dirrahea. No fever. Rare cough. No dyspnea. Zofran as needed.     07/30/2018 Patient presents today for hospital follow-up. Continues to have low grade fever, she has checked at home and reports 100.1. She takes Tylenol as needed, she did not take anything last night. Temp in office 98.7. Feels some better. Slow to recover.  Continues to have cough with clear/thick mucus. Also has nasal congestion. Sob with exertion. Occasional dizziness. BP low normal today. Took cozaar yesterday.   Continues Anoro once daily. Still using flutter valve as needed a couple times yesterday. Using therapy vest 2-3 times a day which helps a lot and able to cough up mucus. Doesn't like nebulizer machine, having a hard time cleaning tubing. Not using hypertonic saline nebulizer.   Continues  protonix and carafate. Needs follow-up with GI.    Testing reviewed: - CXR 07/30/2018-  showed persistent but slight improved bibasilar infiltrates particularly on the right  - ECHO 07/29/18- EF 60-65%, mild MR, LV remains thickened but overall ok   BC negative x2 EGD- negative eosinophilic gastritis  Aspergillus - negative  Strongyloid- negative   Urine- negative   Allergies  Allergen Reactions  . Prednisone Shortness Of Breath, Itching, Nausea And Vomiting and Palpitations  . Solu-Medrol [Methylprednisolone] Anaphylaxis  . Amoxicillin Hives and Rash    Has patient had a PCN reaction causing immediate rash, facial/tongue/throat swelling, SOB or lightheadedness with hypotension:Yes Has patient had a PCN reaction causing severe rash involving mucus membranes or skin necrosis: NO Has patient had a PCN reaction that required hospitalization NO Has patient had a PCN reaction occurring within the last 10 years: NO If all of the above answers are "NO", then may proceed with Cephalosporin use.   . Codeine Hives, Itching and Rash  . Hydrocodone-Acetaminophen Hives, Itching and Rash  . Penicillins Hives, Itching and Rash    ALLERGIC REACTION TO ORAL AMOXICILLIN Has patient had a PCN reaction causing immediate rash, facial/tongue/throat swelling, SOB or lightheadedness with hypotension: Yes Has patient had a PCN reaction causing severe rash involving mucus membranes or skin necrosis: No Has patient had a PCN reaction that required hospitalization: No Has patient had a PCN reaction occurring within the last 10 years: No If all of the above answers are "NO", then may proceed with Cephalosporin use.    Immunization History  Administered Date(s) Administered  . H1N1 11/13/2008  . Influenza Whole 07/21/2008, 07/28/2009, 06/28/2010,  07/05/2011, 09/22/2012  . Influenza, High Dose Seasonal PF 07/05/2017, 07/14/2018  . Influenza,inj,Quad PF,6+ Mos 07/11/2013, 07/03/2014, 06/23/2015, 06/29/2016  .  Pneumococcal Conjugate-13 12/19/2013  . Pneumococcal Polysaccharide-23 02/25/2009, 04/19/2015  . Td 02/02/2010  . Tdap 04/25/2016    Past Medical History:  Diagnosis Date  . Anxiety   . Bipolar disorder ([place])   . CANDIDIASIS, ESOPHAGEAL 07/28/2009   Qualifier: Diagnosis of  By: [name] MD, [name]   . Chronic kidney disease    CKD III  . COPD (chronic obstructive pulmonary disease) ([place])   . Depression   . Diabetes mellitus without complication (HCC)    no meds  . FH: colonic polyps   . Fractured elbow    right   . GERD (gastroesophageal reflux disease)   . HH (hiatus hernia)   . History of carpal tunnel syndrome   . History of chest pain   . History of transfusion of packed red blood cells   . Hyperlipidemia   . Hypertension   . Neuroleptic-induced tardive dyskinesia   . Osteoarthritis of more than one site   . Seasonal allergies     Tobacco History: Social History   Tobacco Use  Smoking Status Former Smoker  . Packs/day: 2.00  . Years: 30.00  . Pack years: 60.00  . Types: Cigarettes  . Last attempt to quit: 10/23/2001  . Years since quitting: 16.7  Smokeless Tobacco Never Used   Counseling given: Not Answered   Outpatient Medications Prior to Visit  Medication Sig Dispense Refill  . acetaminophen (TYLENOL) 500 MG tablet Take 2 tablets (1,000 mg total) by mouth every 6 (six) hours as needed. (Patient taking differently: Take 1,000 mg by mouth every 6 (six) hours as needed for mild pain or headache. ) 30 tablet 0  . atorvastatin (LIPITOR) 20 MG tablet TAKE 1 TABLET BY MOUTH ONCE DAILY 90 tablet 0  . Blood Glucose Monitoring Suppl (ACCU-CHEK NANO SMARTVIEW) W/DEVICE KIT Use to check blood sugar 1-2 times daily (Patient taking differently: 1 strip by Other route 2 (two) times a week. ) 1 kit 0  . cetirizine (ZYRTEC) 10 MG tablet Take 10 mg by mouth at bedtime.     . diclofenac sodium (VOLTAREN) 1 % GEL Apply 3 grams to 3 large joints, up to 3 times daily. (Patient  taking differently: Apply 3 g topically See admin instructions. Apply 3 grams to 3 large joints, up to 3 times daily) 3 Tube 3  . docusate sodium (COLACE) 100 MG capsule Take 100 mg by mouth at bedtime.     . DULoxetine (CYMBALTA) 60 MG capsule Take 1 capsule (60 mg total) by mouth daily. (Patient taking differently: Take 60 mg by mouth 2 (two) times daily. ) 30 capsule 0  . feeding supplement, ENSURE ENLIVE, (ENSURE ENLIVE) LIQD Take 237 mLs by mouth 3 (three) times daily between meals. 14 Bottle 0  . fluticasone (FLONASE) 50 MCG/ACT nasal spray Place 1-2 sprays into both nostrils at bedtime.     Marland Kitchen glucose blood test strip Accu-chek nano, test glucose once daily, dx E11.9 100 each 12  . guaiFENesin (MUCINEX) 600 MG 12 hr tablet Take 600 mg by mouth at bedtime.    Marland Kitchen ipratropium-albuterol (DUONEB) 0.5-2.5 (3) MG/3ML SOLN INHALE CONTENTS OF 1 VIAL THREE TIMES A DAY IN NEBULIZER (Patient taking differently: Take 3 mLs by nebulization 3 (three) times daily. ) 1080 mL 4  . lamoTRIgine (LAMICTAL) 25 MG tablet Take 1 tablet (25 mg total) by mouth 2 (  two) times daily. 14 tablet 0  . Lancets Misc. (ACCU-CHEK FASTCLIX LANCET) KIT USE TO CHECK BLOOD SUGAR 1-2 TIMES DAILY 1 kit 0  . linaclotide (LINZESS) 145 MCG CAPS capsule Take 145 mcg by mouth daily before breakfast.    . losartan (COZAAR) 50 MG tablet TAKE 1 TABLET BY MOUTH ONCE DAILY 90 tablet 0  . montelukast (SINGULAIR) 10 MG tablet Take 1 tablet (10 mg total) at bedtime by mouth. 30 tablet 11  . Multiple Vitamins-Minerals (CENTRUM SILVER PO) Take 1 tablet by mouth daily.     Salley Scarlet FORMULARY FDgard: Take 1 capsule by mouth at bedtime    . NON FORMULARY Respivest: Three times a day for 30 minutes    . ondansetron (ZOFRAN-ODT) 4 MG disintegrating tablet Take 1 tablet (4 mg total) by mouth every 8 (eight) hours as needed for nausea or vomiting. 20 tablet 0  . pantoprazole (PROTONIX) 40 MG tablet Take 40 mg by mouth daily.    . Probiotic Product (ALIGN PO)  Take 1 capsule by mouth daily with breakfast.     . Respiratory Therapy Supplies (FLUTTER) DEVI 1 Device by Does not apply route as directed. (Patient taking differently: 1 Device as directed. ) 1 each 0  . sodium chloride HYPERTONIC 3 % nebulizer solution Take by nebulization 2 (two) times daily. (Patient taking differently: Take 4 mLs by nebulization 2 (two) times daily. ) 750 mL 5  . sucralfate (CARAFATE) 1 GM/10ML suspension Take 10 mLs (1 g total) by mouth 2 (two) times daily. 420 mL 0  . umeclidinium-vilanterol (ANORO ELLIPTA) 62.5-25 MCG/INH AEPB Inhale 1 puff into the lungs daily. 60 each 5   No facility-administered medications prior to visit.     Review of Systems  Review of Systems  Constitutional: Positive for fever. Negative for activity change, appetite change, chills and diaphoresis.  HENT: Negative.   Respiratory: Positive for cough, shortness of breath and wheezing. Negative for apnea, choking, chest tightness and stridor.   Cardiovascular: Negative.   Gastrointestinal: Positive for nausea and vomiting. Negative for abdominal pain, blood in stool and diarrhea.    Physical Exam  BP (!) 102/50   Pulse (!) 104   Temp 98.7 F (37.1 C)   Ht _0  (1.6 m)   Wt 141 lb 9.6 oz (64.2 kg)   SpO2 95%   BMI 25.08 kg/m  Physical Exam  Constitutional: She is oriented to person, place, and time. She appears well-developed and well-nourished. No distress.  HENT:  Head: Normocephalic and atraumatic.  Eyes: Pupils are equal, round, and reactive to light. EOM are normal.  Neck: Normal range of motion. Neck supple.  Cardiovascular: Normal rate and regular rhythm.  Pulmonary/Chest: Effort normal. No respiratory distress. She has wheezes. She has rales.  Abdominal: Soft. She exhibits no distension.  Musculoskeletal: Normal range of motion.  Neurological: She is alert and oriented to person, place, and time.  Skin: Skin is warm and dry. No rash noted.  Psychiatric: She has a normal  mood and affect. Her behavior is normal. Judgment and thought content normal.     Lab Results:  CBC    Component Value Date/Time   WBC 44.2 (HH) 07/30/2018 1240   RBC 3.66 (L) 07/30/2018 1240   HGB 10.8 (L) 07/30/2018 1240   HCT 33.2 (L) 07/30/2018 1240   PLT 474.0 (H) 07/30/2018 1240   MCV 90.8 07/30/2018 1240   MCH 30.3 07/20/2018 0121   MCHC 32.5 07/30/2018 1240   RDW 15.3  07/30/2018 1240   LYMPHSABS 3.0 07/30/2018 1240   MONOABS 1.0 07/30/2018 1240   EOSABS 33.7 (H) 07/30/2018 1240   BASOSABS 0.2 (H) 07/30/2018 1240    BMET    Component Value Date/Time   NA 134 (L) 07/30/2018 1240   NA 138 12/13/2017   K 4.3 07/30/2018 1240   CL 96 07/30/2018 1240   CO2 31 07/30/2018 1240   GLUCOSE 95 07/30/2018 1240   BUN 16 07/30/2018 1240   BUN 18 12/13/2017   CREATININE 1.45 (H) 07/30/2018 1240   CALCIUM 8.7 07/30/2018 1240   CALCIUM 10.1 06/25/2008 0000   GFRNONAA 39 (L) 07/20/2018 0121   GFRAA 45 (L) 07/20/2018 0121    BNP No results found for: BNP  ProBNP    Component Value Date/Time   PROBNP 5.0 12/02/2014 0906    Imaging: Ct Abdomen Pelvis Wo Contrast  Result Date: 07/18/2018 CLINICAL DATA:  Upper abdominal epigastric pain, vomiting, history of stage III chronic kidney disease, COPD, diabetes mellitus, hypertension, former smoker EXAM: CT ABDOMEN AND PELVIS WITHOUT CONTRAST TECHNIQUE: Multidetector CT imaging of the abdomen and pelvis was performed following the standard protocol without IV contrast. Sagittal and coronal MPR images reconstructed from axial data set. Patient drank dilute oral contrast for exam. COMPARISON:  01/03/2017 FINDINGS: Lower chest: Small RIGHT pleural effusion. Consolidation RIGHT lower lobe. Minimal LEFT basilar atelectasis. Hepatobiliary: Gallbladder and liver normal appearance Pancreas: Normal appearance Spleen: Normal appearance Adrenals/Urinary Tract: Adrenal glands unremarkable. Nonobstructing calculus mid inferior LEFT kidney. No  definite renal mass or hydronephrosis. No ureteral calcification or dilatation. Bladder unremarkable. Stomach/Bowel: Normal appendix. Stomach and bowel loops normal appearance Vascular/Lymphatic: Atherosclerotic calcifications aorta and iliac arteries without aneurysm. Scattered normal size retroperitoneal lymph nodes. No definite abdominal or pelvic adenopathy. Reproductive: Unremarkable uterus and ovaries Other: No free air or free fluid. Minimal edema within tissue planes in the pelvis versus previous exam though no specific/localized acute inflammatory process is identified. Musculoskeletal: Osseous demineralization. Lumbar fusion L4-L5 with minimal anterolisthesis. Facet degenerative changes lower lumbar spine. No acute bony findings. IMPRESSION: Small RIGHT pleural effusion with mild consolidation in RIGHT lower lobe. Minimal edema of soft tissue planes in pelvis though no specific acute inflammatory processes localized. Small nonobstructing LEFT renal calculus. Remainder of exam unremarkable. Electronically Signed   By: Lavonia Dana M.D.   On: 07/18/2018 14:21   Dg Chest 2 View  Result Date: 07/30/2018 CLINICAL DATA:  Follow-up pneumonia EXAM: CHEST - 2 VIEW COMPARISON:  07/16/2018 FINDINGS: Cardiac shadow is stable. Persistent bibasilar infiltrate right greater than left is noted. No sizable effusion is seen. Degenerative changes of the thoracic spine are noted. No other focal abnormality is seen. IMPRESSION: Persistent but slightly improved bibasilar infiltrates particularly on the right. Electronically Signed   By: Inez Catalina M.D.   On: 07/30/2018 12:54   Dg Chest 2 View  Result Date: 07/16/2018 CLINICAL DATA:  Productive cough and shortness of breath with midline chest pain. Former smoker. Currently diagnosed with right lower lobe pneumonia. EXAM: CHEST - 2 VIEW COMPARISON:  Portable chest x-ray of July 14, 2018 FINDINGS: There remains mild volume loss on the right. There is a small right  pleural effusion. The left lung is well-expanded. The interstitial markings of both lungs are coarse. The heart and pulmonary vascularity are normal. The mediastinum is normal in width. The bony thorax exhibits no acute abnormality. IMPRESSION: Persistent right lower lobe pneumonia. Small right pleural effusion. Underlying COPD. Electronically Signed   By: David  Martinique M.D.  On: 07/16/2018 10:17   Dg Chest 2 View  Result Date: 07/11/2018 CLINICAL DATA:  Nausea vomiting and headache x1 week EXAM: CHEST - 2 VIEW COMPARISON:  08/22/2017 FINDINGS: Heart size is normal. There is mild aortic atherosclerosis without aneurysm. No acute pulmonary consolidation. Mild chronic interstitial prominence and bibasilar atelectasis is identified. No overt pulmonary edema. ACDF of the lower cervical spine. No acute osseous abnormality. IMPRESSION: 1. Chronic mild interstitial lung disease and bibasilar atelectasis. 2. Minimal aortic atherosclerosis. 3. No acute pulmonary disease Electronically Signed   By: Ashley Royalty M.D.   On: 07/11/2018 18:08   Ct Chest Wo Contrast  Result Date: 07/11/2018 CLINICAL DATA:  Nausea and vomiting for 1 week. Recurrent chest pain for 6 months. Shortness of breath and dizziness with exertion. Productive cough. EXAM: CT CHEST WITHOUT CONTRAST TECHNIQUE: Multidetector CT imaging of the chest was performed following the standard protocol without IV contrast. COMPARISON:  High-resolution chest CT 09/26/2017 FINDINGS: Cardiovascular: Normal heart size. Minimal pericardial effusion. Normal caliber thoracic aorta with scattered calcifications. Mediastinum/Nodes: Mild prominence of mediastinal lymph nodes with pretracheal nodes measuring up to about 10 mm short axis dimension. Similar appearance to previous study. Likely to be reactive. Esophagus is decompressed. Thyroid gland is unremarkable. Lungs/Pleura: Scattered areas of interstitial fibrosis in the lungs. Bronchial wall thickening suggesting  chronic bronchitis. Bronchiectasis with mucous plugging in the lung bases. Patchy focal areas of consolidation in the lung bases, more prominent on the right. Consolidative changes are new since previous study suggesting interval development of an acute bronchopneumonia. No pleural effusions. No pneumothorax. Upper Abdomen: No acute changes identified. Vascular calcifications. Musculoskeletal: Degenerative changes in the thoracic spine. No destructive bone lesions. IMPRESSION: 1. Patchy areas of consolidation in the lung bases, more prominent on the right, suggesting interval development of acute bronchopneumonia. 2. Chronic bronchitic changes with bronchiectasis and mucous plugging in the lung bases. 3. Mild mediastinal lymphadenopathy, likely reactive. 4. Minimal pericardial effusion. Aortic Atherosclerosis (ICD10-I70.0). Electronically Signed   By: Lucienne Capers M.D.   On: 07/11/2018 23:55   Dg Chest Port 1v Same Day  Result Date: 07/14/2018 CLINICAL DATA:  Shortness of breath EXAM: PORTABLE CHEST 1 VIEW COMPARISON:  CT chest dated 07/11/2018 FINDINGS: Patchy right lower lobe opacity, suspicious for pneumonia. Associated trace right pleural effusion. Bilateral lower lobe bronchiectasis. No pneumothorax. The heart is normal in size. Cervical spine fixation hardware. IMPRESSION: Patchy right lower lobe opacity, suspicious for pneumonia. Associated trace right pleural effusion. Electronically Signed   By: Julian Hy M.D.   On: 07/14/2018 08:20     Assessment & Plan:   Pleasant 67 year old female, former smoker. PMH COPD, bronchiectasis. Patient of Dr. Lake Bells. Recent hospital admission for CAP. Lab work revealed WBC 31, EOS absolute 22. IgE elevated 1,224 (120 previously). Pulmonary consulted inpatient, they did not feel eosinophilia was from pulmonary cause. ID recommended strongyloid/Aspergillus antibody which were both negative. BC negative x2. Urine culture negative. EGD negative eosinophilic  gastritis.   Seen today for hospital follow-up. Patient feels some better, appears stable in office. Awake/alert and in good spirits. Respiratory status is stable and slowly improving. CXR today showed persistent but slightly improved bibasilar infiltrates particularly on the right. Repeat CBC today obtained showing WBC 44 (sending labs for path). Continues to have low grade temp with nasuea/vomiting. BP low/normal today, plan hold Cozaar and encourage oral hydration. Tylenol prn fever. Needs GI follow-up, hem/onc apt scheduled for 10/16. Follow-up with pulmonary in 1 week.    Hypertension -  BP 102/50 - Instructed patient to hold Cozaar 66m today - Check BP at home, restart 1/2 tab if BP >130/80 (pt has machine at home)   Pneumonia - Dx CAP 09/19 - Received broad spectrum IV abx, finished oral levaquin  - CXR shows persistent but slightly improved bibasilar infiltrates, particularly on the right side   Bronchiectasis without complication (HCC) - Continues flutter valve, therapy vest - Needs new supplies for nebulizer  - Encourage hypertonic saline nebulizer twice daily   EOSINOPHILIA - WBC peaked 31; Eos absolute 22  (during hospital stay) - Labs today showed significantly elevated WBC 44  - Pulmonary consulted in-patient, they did not feel eosinophilia was d/t pulmonary cause.   - Reviewed with Dr. SHalford Chessman needs hematology consult which is scheduled for 10/16 - Reported allergy to prednisone  Labs sent for pathologist smear review. Results showed absolute eosinophilia, the most common associations being allergic disorders, certain parasitic infections and Hodgkin disease.  Reviewed results with Dr. 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 ANCA, Aspergillus and Strongyloid negative. Treatment is steriod. Patient has allergy to prednisone, received IV Solu-mderol [id number] in hosp with bendaryl. No known reaction to Decadron, confirmed with patient over the phone. Plan Decadron [id number] x 5  days. PRN benadryl. Stop taking if develops reaction. FU in 1 week. May need to consider bronchoscopy if no improvement. Seeing hematology on 10/16.    EliMartyn EhrichP 07/30/2018

## 2018-07-30 NOTE — Telephone Encounter (Signed)
Yes, send labs out for path.  Also needs referral to hematology per Dr. Craige Cotta.

## 2018-07-30 NOTE — Telephone Encounter (Signed)
Patient returned phone call. °

## 2018-07-30 NOTE — Assessment & Plan Note (Signed)
-   WBC peaked 31; Eos absolute 22  (during hospital stay) - Labs today showed significantly elevated WBC 44  - Pulmonary consulted in-patient, they did not feel eosinophilia was d/t pulmonary cause.   - Reviewed with Dr. Craige Cotta, needs hematology consult which is scheduled for 10/16 - Reported allergy to prednisone

## 2018-07-31 ENCOUNTER — Ambulatory Visit: Payer: Self-pay | Admitting: Psychiatry

## 2018-07-31 LAB — PATHOLOGIST SMEAR REVIEW

## 2018-07-31 MED ORDER — DEXAMETHASONE 4 MG PO TABS: 4.0000 mg | ORAL_TABLET | Freq: Two times a day (BID) | ORAL | 0 refills | Status: DC

## 2018-07-31 NOTE — Addendum Note (Signed)
Addended by: Glenford Bayley on: 07/31/2018 05:22 PM   Modules accepted: Orders

## 2018-08-01 ENCOUNTER — Ambulatory Visit: Payer: Self-pay | Admitting: Pharmacy Technician

## 2018-08-01 ENCOUNTER — Ambulatory Visit: Payer: Medicare HMO | Admitting: Physician Assistant

## 2018-08-01 ENCOUNTER — Encounter: Payer: Self-pay | Admitting: Physician Assistant

## 2018-08-01 ENCOUNTER — Other Ambulatory Visit: Payer: Self-pay | Admitting: Pharmacy Technician

## 2018-08-01 VITALS — BP 109/63 | HR 94 | Resp 13 | Ht 63.0 in | Wt 142.0 lb

## 2018-08-01 DIAGNOSIS — Z9889 Other specified postprocedural states: Secondary | ICD-10-CM

## 2018-08-01 DIAGNOSIS — M19072 Primary osteoarthritis, left ankle and foot: Secondary | ICD-10-CM

## 2018-08-01 DIAGNOSIS — Z8639 Personal history of other endocrine, nutritional and metabolic disease: Secondary | ICD-10-CM

## 2018-08-01 DIAGNOSIS — M5030 Other cervical disc degeneration, unspecified cervical region: Secondary | ICD-10-CM | POA: Diagnosis not present

## 2018-08-01 DIAGNOSIS — M170 Bilateral primary osteoarthritis of knee: Secondary | ICD-10-CM | POA: Diagnosis not present

## 2018-08-01 DIAGNOSIS — I10 Essential (primary) hypertension: Secondary | ICD-10-CM

## 2018-08-01 DIAGNOSIS — M19041 Primary osteoarthritis, right hand: Secondary | ICD-10-CM | POA: Diagnosis not present

## 2018-08-01 DIAGNOSIS — G2401 Drug induced subacute dyskinesia: Secondary | ICD-10-CM

## 2018-08-01 DIAGNOSIS — M5136 Other intervertebral disc degeneration, lumbar region: Secondary | ICD-10-CM

## 2018-08-01 DIAGNOSIS — E782 Mixed hyperlipidemia: Secondary | ICD-10-CM

## 2018-08-01 DIAGNOSIS — R768 Other specified abnormal immunological findings in serum: Secondary | ICD-10-CM

## 2018-08-01 DIAGNOSIS — Z8659 Personal history of other mental and behavioral disorders: Secondary | ICD-10-CM

## 2018-08-01 DIAGNOSIS — M19042 Primary osteoarthritis, left hand: Secondary | ICD-10-CM

## 2018-08-01 DIAGNOSIS — M19071 Primary osteoarthritis, right ankle and foot: Secondary | ICD-10-CM | POA: Diagnosis not present

## 2018-08-01 DIAGNOSIS — Z8679 Personal history of other diseases of the circulatory system: Secondary | ICD-10-CM | POA: Diagnosis not present

## 2018-08-01 DIAGNOSIS — T43505A Adverse effect of unspecified antipsychotics and neuroleptics, initial encounter: Secondary | ICD-10-CM

## 2018-08-01 DIAGNOSIS — M7541 Impingement syndrome of right shoulder: Secondary | ICD-10-CM | POA: Diagnosis not present

## 2018-08-01 DIAGNOSIS — L639 Alopecia areata, unspecified: Secondary | ICD-10-CM

## 2018-08-01 DIAGNOSIS — J479 Bronchiectasis, uncomplicated: Secondary | ICD-10-CM

## 2018-08-01 NOTE — Patient Outreach (Addendum)
[facility]) Care Management  08/01/2018  IXEL BOEHNING [birth year] [id number]   Unsuccessful call to patient, HIPAA compliant voicemail left. Calling to confirm application has been received.  Will make 2nd call attempt in 3-5 business days if call not returned.  ADDENDUM 3:30pm  Incoming call from patient, HIPAA identifiers verified. Ms. Piccini states that she has not received the patient assistance application however she has not recently checked her mailbox. She stated she would check it either this afternoon or in the morning and contact me back.  Will follow up with patient in 3-5 business days if she has not contacted me.  [name] [name] [facility] Care Management [phone]

## 2018-08-02 ENCOUNTER — Telehealth: Payer: Self-pay | Admitting: Primary Care

## 2018-08-02 ENCOUNTER — Other Ambulatory Visit: Payer: Self-pay

## 2018-08-02 MED ORDER — LAMOTRIGINE 25 MG PO TABS: 25.0000 mg | ORAL_TABLET | Freq: Two times a day (BID) | ORAL | 1 refills | Status: DC

## 2018-08-02 MED ORDER — LAMOTRIGINE 25 MG PO TABS: 25.0000 mg | ORAL_TABLET | Freq: Two times a day (BID) | ORAL | 0 refills | Status: DC

## 2018-08-02 NOTE — Progress Notes (Signed)
Reviewed, agree 

## 2018-08-02 NOTE — Telephone Encounter (Signed)
Called and spoke with Patient.  She stated that she had been taking her BP as instructed, and her readings were 158/87 last night, 162/98 this morning. Per last OV instructions, she was to take Cozaar, 1/2 tab (25mg ), for BP over 130/80. Patient had not started back on her Cozaar.  EW instructions listed below were read to Patient. Patient stated understanding.  Patient stated that should would start back on 1/2 tab Cozaar, (25mg ), daily, and would continue to monitor her BP.  07/30/18- OV with , NP-  Instructions   Blood pressure: BP 102/50   Hold Losartan potassium (Cozaar) today Check BP daily, if >130/80 restart Cozaar 1/2 tab daily (25mg )

## 2018-08-05 ENCOUNTER — Other Ambulatory Visit: Payer: Self-pay | Admitting: *Deleted

## 2018-08-05 DIAGNOSIS — R269 Unspecified abnormalities of gait and mobility: Secondary | ICD-10-CM | POA: Diagnosis not present

## 2018-08-05 DIAGNOSIS — J479 Bronchiectasis, uncomplicated: Secondary | ICD-10-CM | POA: Diagnosis not present

## 2018-08-05 NOTE — Patient Outreach (Signed)
[facility]) Care Management  08/05/2018  Desiree [name] 1951-05-04 [id number]   Call placed to member to confirm home visit scheduled for this afternoon, no answer.  HIPAA compliant voice message left.  Proceeded to [facility] home at scheduled time, no answer with multiple knocks on door.  Will follow up within the next 4 business days.  Kemper Durie, [place], MSN [facility] Care Management  [facility] Manager [phone]

## 2018-08-06 ENCOUNTER — Other Ambulatory Visit: Payer: Self-pay

## 2018-08-06 ENCOUNTER — Encounter: Payer: Self-pay | Admitting: Primary Care

## 2018-08-06 ENCOUNTER — Ambulatory Visit: Payer: Medicare HMO | Admitting: Primary Care

## 2018-08-06 ENCOUNTER — Other Ambulatory Visit (INDEPENDENT_AMBULATORY_CARE_PROVIDER_SITE_OTHER): Payer: Medicare HMO

## 2018-08-06 ENCOUNTER — Telehealth: Payer: Self-pay | Admitting: Pulmonary Disease

## 2018-08-06 ENCOUNTER — Emergency Department (HOSPITAL_COMMUNITY): Payer: Medicare HMO

## 2018-08-06 ENCOUNTER — Encounter (HOSPITAL_COMMUNITY): Payer: Self-pay | Admitting: Emergency Medicine

## 2018-08-06 ENCOUNTER — Ambulatory Visit (INDEPENDENT_AMBULATORY_CARE_PROVIDER_SITE_OTHER): Payer: Medicare HMO | Admitting: Primary Care

## 2018-08-06 ENCOUNTER — Observation Stay (HOSPITAL_COMMUNITY)
Admission: EM | Admit: 2018-08-06 | Discharge: 2018-08-08 | Disposition: A | Discharge status: Home / Self Care | Payer: Medicare HMO | Attending: Internal Medicine | Admitting: Internal Medicine

## 2018-08-06 ENCOUNTER — Ambulatory Visit (INDEPENDENT_AMBULATORY_CARE_PROVIDER_SITE_OTHER)
Admission: RE | Admit: 2018-08-06 | Discharge: 2018-08-06 | Disposition: A | Discharge status: Home / Self Care | Payer: Medicare HMO | Source: Ambulatory Visit | Attending: Primary Care | Admitting: Primary Care

## 2018-08-06 DIAGNOSIS — R1084 Generalized abdominal pain: Secondary | ICD-10-CM | POA: Diagnosis not present

## 2018-08-06 DIAGNOSIS — Z888 Allergy status to other drugs, medicaments and biological substances status: Secondary | ICD-10-CM | POA: Insufficient documentation

## 2018-08-06 DIAGNOSIS — K589 Irritable bowel syndrome without diarrhea: Secondary | ICD-10-CM | POA: Diagnosis not present

## 2018-08-06 DIAGNOSIS — R000 Tachycardia, unspecified: Secondary | ICD-10-CM | POA: Diagnosis not present

## 2018-08-06 DIAGNOSIS — D721 Eosinophilia, unspecified: Secondary | ICD-10-CM

## 2018-08-06 DIAGNOSIS — I10 Essential (primary) hypertension: Secondary | ICD-10-CM | POA: Diagnosis not present

## 2018-08-06 DIAGNOSIS — K2980 Duodenitis without bleeding: Secondary | ICD-10-CM | POA: Insufficient documentation

## 2018-08-06 DIAGNOSIS — J90 Pleural effusion, not elsewhere classified: Secondary | ICD-10-CM | POA: Diagnosis not present

## 2018-08-06 DIAGNOSIS — E785 Hyperlipidemia, unspecified: Secondary | ICD-10-CM | POA: Diagnosis not present

## 2018-08-06 DIAGNOSIS — Z79899 Other long term (current) drug therapy: Secondary | ICD-10-CM | POA: Diagnosis not present

## 2018-08-06 DIAGNOSIS — E0822 Diabetes mellitus due to underlying condition with diabetic chronic kidney disease: Secondary | ICD-10-CM | POA: Diagnosis not present

## 2018-08-06 DIAGNOSIS — Z880 Allergy status to penicillin: Secondary | ICD-10-CM | POA: Insufficient documentation

## 2018-08-06 DIAGNOSIS — N200 Calculus of kidney: Secondary | ICD-10-CM | POA: Insufficient documentation

## 2018-08-06 DIAGNOSIS — E860 Dehydration: Secondary | ICD-10-CM | POA: Insufficient documentation

## 2018-08-06 DIAGNOSIS — J189 Pneumonia, unspecified organism: Secondary | ICD-10-CM | POA: Diagnosis not present

## 2018-08-06 DIAGNOSIS — Z794 Long term (current) use of insulin: Secondary | ICD-10-CM | POA: Diagnosis not present

## 2018-08-06 DIAGNOSIS — E782 Mixed hyperlipidemia: Secondary | ICD-10-CM | POA: Diagnosis not present

## 2018-08-06 DIAGNOSIS — I959 Hypotension, unspecified: Secondary | ICD-10-CM | POA: Insufficient documentation

## 2018-08-06 DIAGNOSIS — Z885 Allergy status to narcotic agent status: Secondary | ICD-10-CM | POA: Insufficient documentation

## 2018-08-06 DIAGNOSIS — E1122 Type 2 diabetes mellitus with diabetic chronic kidney disease: Secondary | ICD-10-CM | POA: Diagnosis not present

## 2018-08-06 DIAGNOSIS — Z87891 Personal history of nicotine dependence: Secondary | ICD-10-CM | POA: Insufficient documentation

## 2018-08-06 DIAGNOSIS — J449 Chronic obstructive pulmonary disease, unspecified: Secondary | ICD-10-CM | POA: Diagnosis not present

## 2018-08-06 DIAGNOSIS — I700 Atherosclerosis of aorta: Secondary | ICD-10-CM | POA: Diagnosis not present

## 2018-08-06 DIAGNOSIS — N179 Acute kidney failure, unspecified: Secondary | ICD-10-CM | POA: Insufficient documentation

## 2018-08-06 DIAGNOSIS — R079 Chest pain, unspecified: Secondary | ICD-10-CM | POA: Diagnosis present

## 2018-08-06 DIAGNOSIS — F319 Bipolar disorder, unspecified: Secondary | ICD-10-CM | POA: Diagnosis not present

## 2018-08-06 DIAGNOSIS — I129 Hypertensive chronic kidney disease with stage 1 through stage 4 chronic kidney disease, or unspecified chronic kidney disease: Secondary | ICD-10-CM | POA: Diagnosis not present

## 2018-08-06 DIAGNOSIS — K219 Gastro-esophageal reflux disease without esophagitis: Secondary | ICD-10-CM | POA: Insufficient documentation

## 2018-08-06 DIAGNOSIS — F419 Anxiety disorder, unspecified: Secondary | ICD-10-CM | POA: Insufficient documentation

## 2018-08-06 DIAGNOSIS — K760 Fatty (change of) liver, not elsewhere classified: Secondary | ICD-10-CM | POA: Diagnosis not present

## 2018-08-06 DIAGNOSIS — D72829 Elevated white blood cell count, unspecified: Secondary | ICD-10-CM | POA: Diagnosis present

## 2018-08-06 DIAGNOSIS — J8410 Pulmonary fibrosis, unspecified: Secondary | ICD-10-CM | POA: Diagnosis not present

## 2018-08-06 DIAGNOSIS — R112 Nausea with vomiting, unspecified: Secondary | ICD-10-CM | POA: Diagnosis present

## 2018-08-06 DIAGNOSIS — E1129 Type 2 diabetes mellitus with other diabetic kidney complication: Secondary | ICD-10-CM | POA: Diagnosis present

## 2018-08-06 DIAGNOSIS — N183 Chronic kidney disease, stage 3 (moderate): Secondary | ICD-10-CM | POA: Insufficient documentation

## 2018-08-06 DIAGNOSIS — R1111 Vomiting without nausea: Secondary | ICD-10-CM | POA: Diagnosis not present

## 2018-08-06 DIAGNOSIS — R7989 Other specified abnormal findings of blood chemistry: Secondary | ICD-10-CM | POA: Diagnosis not present

## 2018-08-06 DIAGNOSIS — I421 Obstructive hypertrophic cardiomyopathy: Secondary | ICD-10-CM | POA: Insufficient documentation

## 2018-08-06 DIAGNOSIS — K2900 Acute gastritis without bleeding: Principal | ICD-10-CM | POA: Insufficient documentation

## 2018-08-06 DIAGNOSIS — R778 Other specified abnormalities of plasma proteins: Secondary | ICD-10-CM

## 2018-08-06 LAB — BASIC METABOLIC PANEL
BUN: 34 mg/dL — ABNORMAL HIGH (ref 6–23)
CO2: 24 mEq/L (ref 19–32)
Calcium: 9.1 mg/dL (ref 8.4–10.5)
Chloride: 101 mEq/L (ref 96–112)
Creatinine, Ser: 1.73 mg/dL — ABNORMAL HIGH (ref 0.40–1.20)
GFR: 37.86 mL/min — ABNORMAL LOW (ref >60.00)
Glucose, Bld: 197 mg/dL — ABNORMAL HIGH (ref 70–99)
Potassium: 4.1 mEq/L (ref 3.5–5.1)
Sodium: 139 mEq/L (ref 135–145)

## 2018-08-06 LAB — HEPATIC FUNCTION PANEL
ALT: 20 U/L (ref 0–44)
AST: 25 U/L (ref 15–41)
Albumin: 2.5 g/dL — ABNORMAL LOW (ref 3.5–5.0)
Alkaline Phosphatase: 87 U/L (ref 38–126)
Bilirubin, Direct: 0.2 mg/dL (ref 0.0–0.2)
Indirect Bilirubin: 0.4 mg/dL (ref 0.3–0.9)
Total Bilirubin: 0.6 mg/dL (ref 0.3–1.2)
Total Protein: 6.3 g/dL — ABNORMAL LOW (ref 6.5–8.1)

## 2018-08-06 LAB — CBC WITH DIFFERENTIAL/PLATELET
Basophils Absolute: 0.2 10*3/uL — ABNORMAL HIGH (ref 0.0–0.1)
Basophils Relative: 0.9 % (ref 0.0–3.0)
Eosinophils Absolute: 5.5 10*3/uL — ABNORMAL HIGH (ref 0.0–0.7)
Eosinophils Relative: 30.8 % — ABNORMAL HIGH (ref 0.0–5.0)
HCT: 38.9 % (ref 36.0–46.0)
Hemoglobin: 12.4 g/dL (ref 12.0–15.0)
Lymphocytes Relative: 11.1 % — ABNORMAL LOW (ref 12.0–46.0)
Lymphs Abs: 2 10*3/uL (ref 0.7–4.0)
MCHC: 31.8 g/dL (ref 30.0–36.0)
MCV: 90.3 fl (ref 78.0–100.0)
Monocytes Absolute: 1 10*3/uL (ref 0.1–1.0)
Monocytes Relative: 5.3 % (ref 3.0–12.0)
Neutro Abs: 9.3 10*3/uL — ABNORMAL HIGH (ref 1.4–7.7)
Neutrophils Relative %: 51.9 % (ref 43.0–77.0)
Platelets: 504 10*3/uL — ABNORMAL HIGH (ref 150.0–400.0)
RBC: 4.3 Mil/uL (ref 3.87–5.11)
RDW: 16.6 % — ABNORMAL HIGH (ref 11.5–15.5)
WBC: 17.9 10*3/uL — ABNORMAL HIGH (ref 4.0–10.5)

## 2018-08-06 LAB — LIPASE, BLOOD: Lipase: 42 U/L (ref 11–51)

## 2018-08-06 LAB — URINALYSIS, ROUTINE W REFLEX MICROSCOPIC
Bilirubin Urine: NEGATIVE
Glucose, UA: NEGATIVE mg/dL
Hgb urine dipstick: NEGATIVE
Ketones, ur: 5 mg/dL — AB
Leukocytes, UA: NEGATIVE
Nitrite: NEGATIVE
Protein, ur: 100 mg/dL — AB
Specific Gravity, Urine: >1.046 — ABNORMAL HIGH (ref 1.005–1.030)
pH: 6 (ref 5.0–8.0)

## 2018-08-06 LAB — I-STAT TROPONIN, ED: Troponin i, poc: 0.15 ng/mL (ref 0.00–0.08)

## 2018-08-06 LAB — I-STAT CG4 LACTIC ACID, ED: Lactic Acid, Venous: 1.48 mmol/L (ref 0.5–1.9)

## 2018-08-06 MED ORDER — MORPHINE SULFATE (PF) 2 MG/ML IV SOLN: 2.0000 mg | Freq: Once | INTRAVENOUS | Status: DC

## 2018-08-06 MED ORDER — INSULIN ASPART 100 UNIT/ML ~~LOC~~ SOLN: 0.0000 [IU] | Freq: Every day | SUBCUTANEOUS | Status: DC

## 2018-08-06 MED ORDER — SODIUM CHLORIDE 0.9 % IV SOLN
INTRAVENOUS | Status: DC
  Administered 2018-08-07 – 2018-08-08 (×3): via INTRAVENOUS

## 2018-08-06 MED ORDER — ATORVASTATIN CALCIUM 20 MG PO TABS
20.0000 mg | ORAL_TABLET | Freq: Every day | ORAL | Status: DC
  Administered 2018-08-07: 20 mg via ORAL
  Filled 2018-08-06: qty 1

## 2018-08-06 MED ORDER — SODIUM CHLORIDE 0.9 % IV BOLUS
1000.0000 mL | Freq: Once | INTRAVENOUS | Status: AC
  Administered 2018-08-06: 1000 mL via INTRAVENOUS

## 2018-08-06 MED ORDER — DOCUSATE SODIUM 100 MG PO CAPS
100.0000 mg | ORAL_CAPSULE | Freq: Every day | ORAL | Status: DC
  Administered 2018-08-07: 100 mg via ORAL
  Filled 2018-08-06: qty 1

## 2018-08-06 MED ORDER — PANTOPRAZOLE SODIUM 40 MG PO TBEC
40.0000 mg | DELAYED_RELEASE_TABLET | Freq: Every day | ORAL | Status: DC
  Administered 2018-08-07 – 2018-08-08 (×2): 40 mg via ORAL
  Filled 2018-08-06 (×2): qty 1

## 2018-08-06 MED ORDER — ENSURE ENLIVE PO LIQD
237.0000 mL | Freq: Three times a day (TID) | ORAL | Status: DC
  Administered 2018-08-07 – 2018-08-08 (×3): 237 mL via ORAL

## 2018-08-06 MED ORDER — ENOXAPARIN SODIUM 30 MG/0.3ML ~~LOC~~ SOLN
30.0000 mg | Freq: Every day | SUBCUTANEOUS | Status: DC
  Administered 2018-08-07: 30 mg via SUBCUTANEOUS
  Filled 2018-08-06: qty 0.3

## 2018-08-06 MED ORDER — FLUTICASONE PROPIONATE 50 MCG/ACT NA SUSP
1.0000 | Freq: Every day | NASAL | Status: DC
  Administered 2018-08-07: 1 via NASAL
  Filled 2018-08-06: qty 16

## 2018-08-06 MED ORDER — DEXAMETHASONE 4 MG PO TABS
4.0000 mg | ORAL_TABLET | Freq: Two times a day (BID) | ORAL | Status: DC
  Administered 2018-08-07 (×2): 4 mg via ORAL
  Filled 2018-08-06 (×3): qty 1

## 2018-08-06 MED ORDER — ONDANSETRON HCL 4 MG PO TABS
4.0000 mg | ORAL_TABLET | Freq: Four times a day (QID) | ORAL | Status: DC | PRN
  Administered 2018-08-07: 4 mg via ORAL
  Filled 2018-08-06: qty 1

## 2018-08-06 MED ORDER — ACETAMINOPHEN 500 MG PO TABS: 1000.0000 mg | ORAL_TABLET | Freq: Four times a day (QID) | ORAL | Status: DC | PRN

## 2018-08-06 MED ORDER — UMECLIDINIUM-VILANTEROL 62.5-25 MCG/INH IN AEPB
1.0000 | INHALATION_SPRAY | Freq: Every day | RESPIRATORY_TRACT | Status: DC
  Administered 2018-08-07 – 2018-08-08 (×2): 1 via RESPIRATORY_TRACT
  Filled 2018-08-06: qty 14

## 2018-08-06 MED ORDER — INSULIN ASPART 100 UNIT/ML ~~LOC~~ SOLN
0.0000 [IU] | Freq: Three times a day (TID) | SUBCUTANEOUS | Status: DC
  Administered 2018-08-07: 1 [IU] via SUBCUTANEOUS
  Administered 2018-08-07: 2 [IU] via SUBCUTANEOUS

## 2018-08-06 MED ORDER — LAMOTRIGINE 25 MG PO TABS
25.0000 mg | ORAL_TABLET | Freq: Two times a day (BID) | ORAL | Status: DC
  Administered 2018-08-07 – 2018-08-08 (×4): 25 mg via ORAL
  Filled 2018-08-06 (×4): qty 1

## 2018-08-06 MED ORDER — ONDANSETRON 4 MG PO TBDP: 4.0000 mg | ORAL_TABLET | Freq: Three times a day (TID) | ORAL | Status: DC | PRN

## 2018-08-06 MED ORDER — MONTELUKAST SODIUM 10 MG PO TABS
10.0000 mg | ORAL_TABLET | Freq: Every day | ORAL | Status: DC
  Administered 2018-08-07 (×2): 10 mg via ORAL
  Filled 2018-08-06 (×2): qty 1

## 2018-08-06 MED ORDER — SUCRALFATE 1 GM/10ML PO SUSP
1.0000 g | Freq: Two times a day (BID) | ORAL | Status: DC
  Administered 2018-08-07 – 2018-08-08 (×4): 1 g via ORAL
  Filled 2018-08-06 (×4): qty 10

## 2018-08-06 MED ORDER — GUAIFENESIN ER 600 MG PO TB12
600.0000 mg | ORAL_TABLET | Freq: Every day | ORAL | Status: DC
  Administered 2018-08-07 (×2): 600 mg via ORAL
  Filled 2018-08-06 (×2): qty 1

## 2018-08-06 MED ORDER — LINACLOTIDE 145 MCG PO CAPS
145.0000 ug | ORAL_CAPSULE | Freq: Every day | ORAL | Status: DC
  Administered 2018-08-07 – 2018-08-08 (×2): 145 ug via ORAL
  Filled 2018-08-06 (×2): qty 1

## 2018-08-06 MED ORDER — ASPIRIN 81 MG PO CHEW
324.0000 mg | CHEWABLE_TABLET | Freq: Once | ORAL | Status: AC
  Administered 2018-08-06: 324 mg via ORAL
  Filled 2018-08-06: qty 4

## 2018-08-06 MED ORDER — ONDANSETRON HCL 4 MG/2ML IJ SOLN: 4.0000 mg | Freq: Once | INTRAMUSCULAR | Status: DC

## 2018-08-06 MED ORDER — IPRATROPIUM-ALBUTEROL 0.5-2.5 (3) MG/3ML IN SOLN
3.0000 mL | Freq: Three times a day (TID) | RESPIRATORY_TRACT | Status: DC
  Administered 2018-08-07 – 2018-08-08 (×5): 3 mL via RESPIRATORY_TRACT
  Filled 2018-08-06 (×5): qty 3

## 2018-08-06 MED ORDER — SODIUM CHLORIDE 0.9 % IJ SOLN
INTRAMUSCULAR | Status: AC
  Filled 2018-08-06: qty 50

## 2018-08-06 MED ORDER — LOSARTAN POTASSIUM 50 MG PO TABS
50.0000 mg | ORAL_TABLET | Freq: Every day | ORAL | Status: DC
  Administered 2018-08-07: 50 mg via ORAL
  Filled 2018-08-06: qty 1

## 2018-08-06 MED ORDER — LORATADINE 10 MG PO TABS
10.0000 mg | ORAL_TABLET | Freq: Every day | ORAL | Status: DC
  Administered 2018-08-07 – 2018-08-08 (×2): 10 mg via ORAL
  Filled 2018-08-06 (×2): qty 1

## 2018-08-06 MED ORDER — ALUM & MAG HYDROXIDE-SIMETH 200-200-20 MG/5ML PO SUSP
15.0000 mL | Freq: Once | ORAL | Status: AC
  Administered 2018-08-06: 15 mL via ORAL
  Filled 2018-08-06: qty 30

## 2018-08-06 MED ORDER — LACTATED RINGERS IV SOLN
INTRAVENOUS | Status: DC
  Administered 2018-08-06: 22:00:00 via INTRAVENOUS

## 2018-08-06 MED ORDER — DULOXETINE HCL 60 MG PO CPEP
60.0000 mg | ORAL_CAPSULE | Freq: Every day | ORAL | Status: DC
  Administered 2018-08-07 – 2018-08-08 (×2): 60 mg via ORAL
  Filled 2018-08-06 (×2): qty 1

## 2018-08-06 MED ORDER — DICLOFENAC SODIUM 1 % TD GEL
1.0000 "application " | Freq: Three times a day (TID) | TRANSDERMAL | Status: DC
  Administered 2018-08-07 – 2018-08-08 (×4): 1 via TOPICAL
  Filled 2018-08-06: qty 100

## 2018-08-06 MED ORDER — ONDANSETRON HCL 4 MG/2ML IJ SOLN: 4.0000 mg | Freq: Four times a day (QID) | INTRAMUSCULAR | Status: DC | PRN

## 2018-08-06 MED ORDER — IOHEXOL 300 MG/ML  SOLN
75.0000 mL | Freq: Once | INTRAMUSCULAR | Status: AC | PRN
  Administered 2018-08-06: 75 mL via INTRAVENOUS

## 2018-08-06 MED ORDER — RISAQUAD PO CAPS
1.0000 | ORAL_CAPSULE | Freq: Every day | ORAL | Status: DC
  Administered 2018-08-07 – 2018-08-08 (×2): 1 via ORAL
  Filled 2018-08-06 (×2): qty 1

## 2018-08-06 NOTE — Progress Notes (Signed)
@Patient  ID: Desiree [name], female    DOB: 03/09/1951, 67 y.o.   MRN: [id number]  Chief Complaint  Patient presents with  . Follow-up    SOB, nausea and vomitting and diarrhea x3 days, cough with greenish mucus    Referring provider: [name], MD  HPI: 67 year old female, former smoker quit 2003. PMH COPD, bronchiectasis. Patient of Dr. Lake Bells, last seen 03/26/18. Continues Anoro and therapy vest. Recent hospital admission for CAP, treated with broad spectrum antibiotics.   Hospital course 9/19-9/28 : Admitted for increased shortness of breath. She was febrile, tachycardic and tachypenic in ED. WBC peak 31, eosinophils absolute 20.5. CXR showed chronic mid lung disease and bibasilar atelectasis. Treated with broad spectrum antibiotics; Rocephin, Zithromax, vancomycin and cefepime. BC negative. Urine negative. She also had some mild gastritis and duodenitis. EGD did not show eosinophilic gastritis. Sent home with PPI and carafate. Strongyloides and Aspergillus antibodies was negative. Outpatient recommendations, hem/onc and GI follow-up Needs repeat CBC in 1 month  Saw PCP on 07/23/18 for hosp follow-up.  BP 100/60, HR 119. Recurrent nausea and vomiting. No dirrahea. No fever. Rare cough. No dyspnea. Zofran as needed.   07/30/2018 Hospital fu  Pleasant 68 year old female, former smoker. PMH COPD, bronchiectasis. Patient of Dr. Lake Bells. Recent hospital admission for CAP. Lab work revealed WBC 31, EOS absolute 22. IgE elevated 1,224 (120 previously). Pulmonary consulted inpatient, they did not feel eosinophilia was from pulmonary cause. ID recommended strongyloid/Aspergillus antibody which were both negative. BC negative x2. Urine culture negative. EGD negative eosinophilic gastritis.   Seen for hospital follow-up. Patient feels some better, appears stable in office. Awake/alert and in good spirits. Respiratory status is stable and slowly improving. CXR today showed persistent but  slightly improved bibasilar infiltrates particularly on the right. Repeat CBC today obtained showing WBC 44 (sending labs for path). Continues to have low grade temp with nasuea/vomiting. BP low/normal today, plan hold Cozaar and encourage oral hydration. Tylenol prn fever. Needs GI follow-up, hem/onc apt scheduled for 10/16. Follow-up with pulmonary in 1 week.   Labs sent for pathologist smear review. Results showed absolute eosinophilia, the most common associations being allergic disorders, certain parasitic infections and Hodgkin disease.  Reviewed results with Dr. 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 ANCA, Aspergillus and Strongyloid negative. Treatment is steriod. Patient has allergy to prednisone, received IV Solu-mderol 105m q12 in hosp with bendaryl. No known reaction to Decadron, confirmed with patient over the phone. Plan Decadron 462mBID x 5 days. PRN benadryl. Stop taking if develops reaction. FU in 1 week. May need to consider bronchoscopy if no improvement. Seeing hematology on 10/16.     08/06/2018 OV- N/V and low grade temp Patient presents today for 1 week follow-up visit for CAP, Eosinophilia. Hx bronchiectasis and COPD. I was supposed to see her after hematology consult on 10/16 but patient called with complaints of vomiting green fluid and low grade temp. Started on Decadron per Dr. McKathee Deltonecommendations on 10/9 (54m854mID x 5 days). CXR obtained prior to visit today showed clearing right basilar atelectasis and/or PNA. No new pulmonary consolidation. Vital signs today showed BP 101/68 left arm, 88/62 right arm; HR 130-133. O2 sat 95-98% RA. Afebrile. Held Cozaar during last OV d/t low normal BP. Patient called office with elevated BP and states that she took full tab of cozaar on saturday. She has not taken ANY of her medications since. Vomited total of 4+ times since Sunday.  Having loose to soft stools,  last BM today. Some blood when she wiped. Feels tired, states that she slept a  lot the past two days to "catch up on sleep".  She was able to do house work Friday/saturday and stayed up to 3 am doing laundry. She is alert in office.  Continues to have N/V and low grade temps. Hypotensive and tachycardic in office. Discussed with Dr. Melvyn Novas, patient needs to be evaluated in ED. Labs had not resulted at that time.   Labs showed improving leukocytosis  CXR appears improved after tx with Decadron  Imaging: CXR 07/30/2018-  showed persistent but slight improved bibasilar infiltrates particularly on the right   ECHO 07/29/18- EF 60-65%, mild MR, LV remains thickened but overall ok   CT abd/pelvis 07/18/18 - Small right pleural effusion with mild consolidation right lower lobe. Normal appearance to liver, spleen, gallbladder and appendix   CT Chest 07/11/18 - Patchy areas consolidation in lung bases more prominent on right sug interval development acute bronchopneumonia. Chronic bronchitic changes with bronchiectasis and mucous plugging in lung bases. Mild mediastinal lymphadenopathy, likely reactive   HRCT 09/26/17 - Mild cylindrical and varicoid bronchiectasis in the bilateral basilar lower lobes with scattered mucoid impaction, new since 2014 chest CT, consider recurrent aspiration given the distribution. Mild postinfectious/postinflammatory scarring in the mid to lower lungs bilaterally. mild bronchiectasis, scarring dec 2018  Testing reviewed: EGD- negative eosinophilic gastritis , H-pylori neg  Labs: WBC 10/8 - 44 Eos absolute 10/8 - 33 IgE 9/27- 1,224 (120) Aspergillus 9/27- negative  Strongyloid 9/27- negative   Lactic acid 9/26- 1.0 ANCA 9/26- neg CRP 9/26 - 4.9 (H) Sed rate 9/26 - 61 (H) BC negative x2 Urine- negative    Allergies  Allergen Reactions  . Prednisone Shortness Of Breath, Itching, Nausea And Vomiting and Palpitations  . Solu-Medrol [Methylprednisolone] Anaphylaxis  . Amoxicillin Hives and Rash    Has patient had a PCN reaction causing immediate  rash, facial/tongue/throat swelling, SOB or lightheadedness with hypotension:Yes Has patient had a PCN reaction causing severe rash involving mucus membranes or skin necrosis: NO Has patient had a PCN reaction that required hospitalization NO Has patient had a PCN reaction occurring within the last 10 years: NO If all of the above answers are "NO", then may proceed with Cephalosporin use.   . Codeine Hives, Itching and Rash  . Hydrocodone-Acetaminophen Hives, Itching and Rash  . Penicillins Hives, Itching and Rash    ALLERGIC REACTION TO ORAL AMOXICILLIN Has patient had a PCN reaction causing immediate rash, facial/tongue/throat swelling, SOB or lightheadedness with hypotension: Yes Has patient had a PCN reaction causing severe rash involving mucus membranes or skin necrosis: No Has patient had a PCN reaction that required hospitalization: No Has patient had a PCN reaction occurring within the last 10 years: No If all of the above answers are "NO", then may proceed with Cephalosporin use.    Immunization History  Administered Date(s) Administered  . H1N1 11/13/2008  . Influenza Whole 07/21/2008, 07/28/2009, 06/28/2010, 07/05/2011, 09/22/2012  . Influenza, High Dose Seasonal PF 07/05/2017, 07/14/2018  . Influenza,inj,Quad PF,6+ Mos 07/11/2013, 07/03/2014, 06/23/2015, 06/29/2016  . Pneumococcal Conjugate-13 12/19/2013  . Pneumococcal Polysaccharide-23 02/25/2009, 04/19/2015  . Td 02/02/2010  . Tdap 04/25/2016    Past Medical History:  Diagnosis Date  . Anxiety   . Bipolar disorder ([place])   . CANDIDIASIS, ESOPHAGEAL 07/28/2009   Qualifier: Diagnosis of  By: [name] MD, [name]   . Chronic kidney disease    CKD III  . COPD (chronic  obstructive pulmonary disease) ([place])   . Depression   . Diabetes mellitus without complication ([facility])    no meds  . FH: colonic polyps   . Fractured elbow    right   . GERD (gastroesophageal reflux disease)   . HH (hiatus hernia)   . History of carpal  tunnel syndrome   . History of chest pain   . History of transfusion of packed red blood cells   . Hyperlipidemia   . Hypertension   . Neuroleptic-induced tardive dyskinesia   . Osteoarthritis of more than one site   . Seasonal allergies     Tobacco History: Social History   Tobacco Use  Smoking Status Former Smoker  . Packs/day: 2.00  . Years: 30.00  . Pack years: 60.00  . Types: Cigarettes  . Last attempt to quit: 10/23/2001  . Years since quitting: 16.7  Smokeless Tobacco Never Used   Counseling given: Not Answered   No facility-administered medications prior to visit.    Outpatient Medications Prior to Visit  Medication Sig Dispense Refill  . acetaminophen (TYLENOL) 500 MG tablet Take 2 tablets (1,000 mg total) by mouth every 6 (six) hours as needed. (Patient taking differently: Take 1,000 mg by mouth every 6 (six) hours as needed for mild pain or headache. ) 30 tablet 0  . atorvastatin (LIPITOR) 20 MG tablet TAKE 1 TABLET BY MOUTH ONCE DAILY 90 tablet 0  . Blood Glucose Monitoring Suppl (ACCU-CHEK NANO SMARTVIEW) W/DEVICE KIT Use to check blood sugar 1-2 times daily (Patient taking differently: 1 strip by Other route 2 (two) times a week. ) 1 kit 0  . cetirizine (ZYRTEC) 10 MG tablet Take 10 mg by mouth at bedtime.     . diclofenac sodium (VOLTAREN) 1 % GEL Apply 3 grams to 3 large joints, up to 3 times daily. (Patient taking differently: Apply 3 g topically See admin instructions. Apply 3 grams to 3 large joints, up to 3 times daily) 3 Tube 3  . docusate sodium (COLACE) 100 MG capsule Take 100 mg by mouth at bedtime.     . DULoxetine (CYMBALTA) 60 MG capsule Take 1 capsule (60 mg total) by mouth daily. 30 capsule 0  . feeding supplement, ENSURE ENLIVE, (ENSURE ENLIVE) LIQD Take 237 mLs by mouth 3 (three) times daily between meals. 14 Bottle 0  . fluticasone (FLONASE) 50 MCG/ACT nasal spray Place 1-2 sprays into both nostrils at bedtime.     Marland Kitchen glucose blood test strip  Accu-chek nano, test glucose once daily, dx E11.9 100 each 12  . guaiFENesin (MUCINEX) 600 MG 12 hr tablet Take 600 mg by mouth at bedtime.    Marland Kitchen ipratropium-albuterol (DUONEB) 0.5-2.5 (3) MG/3ML SOLN INHALE CONTENTS OF 1 VIAL THREE TIMES A DAY IN NEBULIZER (Patient taking differently: Take 3 mLs by nebulization 3 (three) times daily. ) 1080 mL 4  . lamoTRIgine (LAMICTAL) 25 MG tablet Take 1 tablet (25 mg total) by mouth 2 (two) times daily. 60 tablet 1  . Lancets Misc. (ACCU-CHEK FASTCLIX LANCET) KIT USE TO CHECK BLOOD SUGAR 1-2 TIMES DAILY 1 kit 0  . linaclotide (LINZESS) 145 MCG CAPS capsule Take 145 mcg by mouth daily before breakfast.    . losartan (COZAAR) 50 MG tablet TAKE 1 TABLET BY MOUTH ONCE DAILY 90 tablet 0  . montelukast (SINGULAIR) 10 MG tablet Take 1 tablet (10 mg total) at bedtime by mouth. 30 tablet 11  . Multiple Vitamins-Minerals (CENTRUM SILVER PO) Take 1 tablet by mouth daily.     Marland Kitchen  NON FORMULARY FDgard: Take 1 capsule by mouth at bedtime    . NON FORMULARY Respivest: Three times a day for 30 minutes    . ondansetron (ZOFRAN-ODT) 4 MG disintegrating tablet Take 1 tablet (4 mg total) by mouth every 8 (eight) hours as needed for nausea or vomiting. 20 tablet 0  . pantoprazole (PROTONIX) 40 MG tablet Take 40 mg by mouth daily.    . Probiotic Product (ALIGN PO) Take 1 capsule by mouth daily with breakfast.     . Respiratory Therapy Supplies (FLUTTER) DEVI 1 Device by Does not apply route as directed. (Patient taking differently: 1 Device as directed. ) 1 each 0  . sodium chloride HYPERTONIC 3 % nebulizer solution Take by nebulization 2 (two) times daily. (Patient taking differently: Take 4 mLs by nebulization 2 (two) times daily. ) 750 mL 5  . sucralfate (CARAFATE) 1 GM/10ML suspension Take 10 mLs (1 g total) by mouth 2 (two) times daily. 420 mL 0  . umeclidinium-vilanterol (ANORO ELLIPTA) 62.5-25 MCG/INH AEPB Inhale 1 puff into the lungs daily. 60 each 5  . dexamethasone  (DECADRON) 4 MG tablet Take 1 tablet (4 mg total) by mouth 2 (two) times daily. 10 tablet 0    Review of Systems  Review of Systems  Constitutional: Positive for appetite change, fatigue and fever.  Respiratory: Positive for shortness of breath.   Cardiovascular: Negative.   Gastrointestinal: Positive for abdominal distention, blood in stool, diarrhea, nausea and vomiting.  Skin: Negative.     Physical Exam  BP (!) 88/62 (BP Location: Right Arm)   Pulse (!) 131   Temp 97.9 F (36.6 C)   Ht 5' 3"  (1.6 m)   Wt 133 lb 12.8 oz (60.7 kg)   SpO2 95%   BMI 23.70 kg/m  Physical Exam  Constitutional: She is oriented to person, place, and time. She appears well-developed and well-nourished. No distress.  Thin adult female   HENT:  Head: Normocephalic and atraumatic.  Eyes: Pupils are equal, round, and reactive to light. EOM are normal.  Neck: Neck supple.  Cardiovascular: Regular rhythm.  Regular, HR 130  Pulmonary/Chest: Effort normal. No respiratory distress.  Mild exp wheeze   Abdominal: She exhibits distension.  Neurological: She is alert and oriented to person, place, and time.  Awake and alert, responds to call Qs with full sentences   Skin: Skin is warm and dry.  Psychiatric: She has a normal mood and affect. Her behavior is normal. Judgment and thought content normal.     Lab Results:  CBC    Component Value Date/Time   WBC 17.9 (H) 08/06/2018 1438   RBC 4.30 08/06/2018 1438   HGB 12.4 08/06/2018 1438   HCT 38.9 08/06/2018 1438   PLT 504.0 (H) 08/06/2018 1438   MCV 90.3 08/06/2018 1438   MCH 30.3 07/20/2018 0121   MCHC 31.8 08/06/2018 1438   RDW 16.6 (H) 08/06/2018 1438   LYMPHSABS 2.0 08/06/2018 1438   MONOABS 1.0 08/06/2018 1438   EOSABS 5.5 (H) 08/06/2018 1438   BASOSABS 0.2 (H) 08/06/2018 1438    BMET    Component Value Date/Time   NA 139 08/06/2018 1438   NA 138 12/13/2017   K 4.1 08/06/2018 1438   CL 101 08/06/2018 1438   CO2 24 08/06/2018 1438    GLUCOSE 197 (H) 08/06/2018 1438   BUN 34 (H) 08/06/2018 1438   BUN 18 12/13/2017   CREATININE 1.73 (H) 08/06/2018 1438   CALCIUM 9.1 08/06/2018 1438  CALCIUM 10.1 06/25/2008 0000   GFRNONAA 39 (L) 07/20/2018 0121   GFRAA 45 (L) 07/20/2018 0121    BNP No results found for: BNP  ProBNP    Component Value Date/Time   PROBNP 5.0 12/02/2014 0906    Imaging: Ct Abdomen Pelvis Wo Contrast  Result Date: 07/18/2018 CLINICAL DATA:  Upper abdominal epigastric pain, vomiting, history of stage III chronic kidney disease, COPD, diabetes mellitus, hypertension, former smoker EXAM: CT ABDOMEN AND PELVIS WITHOUT CONTRAST TECHNIQUE: Multidetector CT imaging of the abdomen and pelvis was performed following the standard protocol without IV contrast. Sagittal and coronal MPR images reconstructed from axial data set. Patient drank dilute oral contrast for exam. COMPARISON:  01/03/2017 FINDINGS: Lower chest: Small RIGHT pleural effusion. Consolidation RIGHT lower lobe. Minimal LEFT basilar atelectasis. Hepatobiliary: Gallbladder and liver normal appearance Pancreas: Normal appearance Spleen: Normal appearance Adrenals/Urinary Tract: Adrenal glands unremarkable. Nonobstructing calculus mid inferior LEFT kidney. No definite renal mass or hydronephrosis. No ureteral calcification or dilatation. Bladder unremarkable. Stomach/Bowel: Normal appendix. Stomach and bowel loops normal appearance Vascular/Lymphatic: Atherosclerotic calcifications aorta and iliac arteries without aneurysm. Scattered normal size retroperitoneal lymph nodes. No definite abdominal or pelvic adenopathy. Reproductive: Unremarkable uterus and ovaries Other: No free air or free fluid. Minimal edema within tissue planes in the pelvis versus previous exam though no specific/localized acute inflammatory process is identified. Musculoskeletal: Osseous demineralization. Lumbar fusion L4-L5 with minimal anterolisthesis. Facet degenerative changes lower  lumbar spine. No acute bony findings. IMPRESSION: Small RIGHT pleural effusion with mild consolidation in RIGHT lower lobe. Minimal edema of soft tissue planes in pelvis though no specific acute inflammatory processes localized. Small nonobstructing LEFT renal calculus. Remainder of exam unremarkable. Electronically Signed   By: Lavonia Dana M.D.   On: 07/18/2018 14:21   Dg Chest 2 View  Result Date: 08/06/2018 CLINICAL DATA:  Mucus in congestion of the chest. Follow-up pneumonia. EXAM: CHEST - 2 VIEW COMPARISON:  07/30/2018 FINDINGS: Clearing of right basilar atelectasis and/or pneumonia since prior. No new pulmonary consolidation. Heart and mediastinal contours are stable with mild aortic atherosclerosis. No aneurysm. Partially included lower cervical spine ACDF hardware is identified. No acute nor suspicious osseous abnormalities. IMPRESSION: Clearing of right basilar atelectasis and/or pneumonia since prior. No new pulmonary consolidation or CHF. Aortic atherosclerosis (ICD10-I70.0) Electronically Signed   By: Ashley Royalty M.D.   On: 08/06/2018 14:32   Dg Chest 2 View  Result Date: 07/30/2018 CLINICAL DATA:  Follow-up pneumonia EXAM: CHEST - 2 VIEW COMPARISON:  07/16/2018 FINDINGS: Cardiac shadow is stable. Persistent bibasilar infiltrate right greater than left is noted. No sizable effusion is seen. Degenerative changes of the thoracic spine are noted. No other focal abnormality is seen. IMPRESSION: Persistent but slightly improved bibasilar infiltrates particularly on the right. Electronically Signed   By: Inez Catalina M.D.   On: 07/30/2018 12:54   Dg Chest 2 View  Result Date: 07/16/2018 CLINICAL DATA:  Productive cough and shortness of breath with midline chest pain. Former smoker. Currently diagnosed with right lower lobe pneumonia. EXAM: CHEST - 2 VIEW COMPARISON:  Portable chest x-ray of July 14, 2018 FINDINGS: There remains mild volume loss on the right. There is a small right pleural  effusion. The left lung is well-expanded. The interstitial markings of both lungs are coarse. The heart and pulmonary vascularity are normal. The mediastinum is normal in width. The bony thorax exhibits no acute abnormality. IMPRESSION: Persistent right lower lobe pneumonia. Small right pleural effusion. Underlying COPD. Electronically Signed   By: Shanon Brow  [place] M.D.   On: 07/16/2018 10:17   Dg Chest 2 View  Result Date: 07/11/2018 CLINICAL DATA:  Nausea vomiting and headache x1 week EXAM: CHEST - 2 VIEW COMPARISON:  08/22/2017 FINDINGS: Heart size is normal. There is mild aortic atherosclerosis without aneurysm. No acute pulmonary consolidation. Mild chronic interstitial prominence and bibasilar atelectasis is identified. No overt pulmonary edema. ACDF of the lower cervical spine. No acute osseous abnormality. IMPRESSION: 1. Chronic mild interstitial lung disease and bibasilar atelectasis. 2. Minimal aortic atherosclerosis. 3. No acute pulmonary disease Electronically Signed   By: Ashley Royalty M.D.   On: 07/11/2018 18:08   Ct Chest Wo Contrast  Result Date: 07/11/2018 CLINICAL DATA:  Nausea and vomiting for 1 week. Recurrent chest pain for 6 months. Shortness of breath and dizziness with exertion. Productive cough. EXAM: CT CHEST WITHOUT CONTRAST TECHNIQUE: Multidetector CT imaging of the chest was performed following the standard protocol without IV contrast. COMPARISON:  High-resolution chest CT 09/26/2017 FINDINGS: Cardiovascular: Normal heart size. Minimal pericardial effusion. Normal caliber thoracic aorta with scattered calcifications. Mediastinum/Nodes: Mild prominence of mediastinal lymph nodes with pretracheal nodes measuring up to about 10 mm short axis dimension. Similar appearance to previous study. Likely to be reactive. Esophagus is decompressed. Thyroid gland is unremarkable. Lungs/Pleura: Scattered areas of interstitial fibrosis in the lungs. Bronchial wall thickening suggesting chronic  bronchitis. Bronchiectasis with mucous plugging in the lung bases. Patchy focal areas of consolidation in the lung bases, more prominent on the right. Consolidative changes are new since previous study suggesting interval development of an acute bronchopneumonia. No pleural effusions. No pneumothorax. Upper Abdomen: No acute changes identified. Vascular calcifications. Musculoskeletal: Degenerative changes in the thoracic spine. No destructive bone lesions. IMPRESSION: 1. Patchy areas of consolidation in the lung bases, more prominent on the right, suggesting interval development of acute bronchopneumonia. 2. Chronic bronchitic changes with bronchiectasis and mucous plugging in the lung bases. 3. Mild mediastinal lymphadenopathy, likely reactive. 4. Minimal pericardial effusion. Aortic Atherosclerosis (ICD10-I70.0). Electronically Signed   By: Lucienne Capers M.D.   On: 07/11/2018 23:55   Dg Chest Port 1v Same Day  Result Date: 07/14/2018 CLINICAL DATA:  Shortness of breath EXAM: PORTABLE CHEST 1 VIEW COMPARISON:  CT chest dated 07/11/2018 FINDINGS: Patchy right lower lobe opacity, suspicious for pneumonia. Associated trace right pleural effusion. Bilateral lower lobe bronchiectasis. No pneumothorax. The heart is normal in size. Cervical spine fixation hardware. IMPRESSION: Patchy right lower lobe opacity, suspicious for pneumonia. Associated trace right pleural effusion. Electronically Signed   By: Julian Hy M.D.   On: 07/14/2018 08:20     Assessment & Plan:   67 year old female, hx COPD and bronchiectasis. Recent hospital admission in September for sob, tachycardia and fever. Dx CAP treated with broad spectrum abx. Significant leukocytosis and eosinophilia on labs. WBC 44 and Eos absolute 33 on 10/8. Started on [place] x5 days outpatient. Obtained stat CXR and labs before visit today. CXR showed improvement in right basilar atelectasis/or PNA.   Today presents with continues complaints of N/V  with low grade temp. Found to be tachycardic and hypotensive in office. She has not taken any of her medication the last two days and has been sleeping a lot. Not eating or drinking. She is supposed to have a hematology c/s tomorrow. Discussed with Dr. Melvyn Novas patient needs to be evaluated in ED. From pulmonary standpoint she is better but continues to have systemic symptoms. CXR and Labs improved with steroid.   Patient is on Lamictal and  Cymbalta for bipolar disorder. ? Possible adverse reaction to Lamictal, looked up drug info today and Lamictal has a listed adverse drug reaction with eosinophilia and systemic symptoms. Also, blood dyscrasias. Patient has been on medication for over a year however.   Saw Rheumatology back in Jan for +RF, possible rheumatoid arthritis. HIV negative and Quantiferon-TB gold neg in Jan 2019, EOS and WBC normal in dec 2018, IgE 120.   Needs hematology consult May need bronch if not improving    Martyn Ehrich, NP 08/06/2018

## 2018-08-06 NOTE — Assessment & Plan Note (Signed)
CXR obtained prior to visit today showed clearing right basilar atelectasis and/or PNA  Continues to have N/V and low grade temp. Tachycardic and hypotensive in office. Needs ED eval

## 2018-08-06 NOTE — Telephone Encounter (Signed)
Spoke with pt. Her appointment has been moved up to today with Beth at 2:30pm. Nothing further was needed at this time.

## 2018-08-06 NOTE — ED Triage Notes (Signed)
Pt brought in by [facility] from [place] [facility] for elevated WBC and not come down as fast as they would like. Pt been on Decadron x week.  HR 100, BP 110/68.

## 2018-08-06 NOTE — ED Notes (Signed)
Lab contacted to make sure they received pt's blood tubed Desiree [name] in Lab verified

## 2018-08-06 NOTE — H&P (Signed)
History and Physical   Desiree [name] KDT:[id number] DOB: 07-15-1951 DOA: 08/06/2018  Referring MD/NP/PA: Dr. Tyrone Nine  PCP: [name] [name], MD   Outpatient Specialists: Dr Rosina Lowenstein, Pulmonology   Patient coming from: Home  Chief Complaint: Abnormal lab and shortness of breath.  HPI: Desiree [name] is a 67 y.o. female with medical history significant of COPD, recent community-acquired pneumonia with eosinophilia, bipolar disorder, chronic kidney disease stage III, diabetes and hypertension who went to see her pulmonologist today for hospital follow-up and was having some shortness of breath as well as nausea vomiting.  She was found to be hypotensive with blood pressure of 88/40 and a heart rate of 130.  Patient has been having vomiting on and off for the last 5 days.  No diarrhea.  No significant abdominal pain.  She denied any dysuria no flank pain.  In the ER patient was evaluated and treated for her nausea with vomiting.  She is still nauseated but has not had any vomiting here.  She appears dehydrated.  Patient also was noted to be in acute kidney injury.  Evaluation also showed abnormal EKG which was run by the cardiologist.  It appears to be an old finding.  Troponin is minimally elevated at 0.15.  Patient reported epigastric discomfort but in the setting of vomiting for the last 5 days.  She is being admitted for evaluation and treatment..  ED Course: Temperature is 99.5, initial blood pressure 78/50 but currently 134/72 after fluid boluses pulse 133 respiratory 24 and oxygen sat 94% room air.  White count is 17.9 hemoglobin 12.4 and platelets 504.  BUN is 34 and creatinine 1.73 glucose 197 lactic acid 1.48.  Urinalysis essentially negative.  Review of Systems: As per HPI otherwise 10 point review of systems negative.    Past Medical History:  Diagnosis Date  . Anxiety   . Bipolar disorder ([place])   . CANDIDIASIS, ESOPHAGEAL 07/28/2009   Qualifier: Diagnosis of  By: [name] MD,  [name]   . Chronic kidney disease    CKD III  . COPD (chronic obstructive pulmonary disease) ([place])   . Depression   . Diabetes mellitus without complication (HCC)    no meds  . FH: colonic polyps   . Fractured elbow    right   . GERD (gastroesophageal reflux disease)   . HH (hiatus hernia)   . History of carpal tunnel syndrome   . History of chest pain   . History of transfusion of packed red blood cells   . Hyperlipidemia   . Hypertension   . Neuroleptic-induced tardive dyskinesia   . Osteoarthritis of more than one site   . Seasonal allergies     Past Surgical History:  Procedure Laterality Date  . ANTERIOR CERVICAL DECOMP/DISCECTOMY FUSION  09/27/2016   C5-6 anterior cervical discectomy and fusion, allograft and plate/notes 09/27/2016  . ANTERIOR CERVICAL DECOMP/DISCECTOMY FUSION N/A 09/27/2016   Procedure: C5-6 Anterior Cervical Discectomy and Fusion, Allograft and Plate;  Surgeon: [name], MD;  Location: [facility];  Service: Orthopedics;  Laterality: N/A;  . Back Fusion  2002  . BIOPSY  07/19/2018   Procedure: BIOPSY;  Surgeon: [name], MD;  Location: [facility];  Service: Endoscopy;;  . CARPAL TUNNEL RELEASE  [place]   left  . COLONOSCOPY N/A 01/05/2014   Procedure: COLONOSCOPY;  Surgeon: [name], MD;  Location: [facility];  Service: Endoscopy;  Laterality: N/A;  . ELBOW SURGERY     age 52  .  ESOPHAGOGASTRODUODENOSCOPY (EGD) WITH PROPOFOL N/A 07/19/2018   Procedure: ESOPHAGOGASTRODUODENOSCOPY (EGD) WITH PROPOFOL;  Surgeon: [name], MD;  Location: [facility];  Service: Endoscopy;  Laterality: N/A;  . EXTERNAL EAR SURGERY Left   . EYE SURGERY     "removed white dots under eyelid"  . FINGER SURGERY Left   . Juvara osteomy    . KNEE SURGERY    . NOSE SURGERY    . Rt. toe bunion    . skin, shave biopsy  05/03/2016   Left occipital scalp, top of scalp     reports that she quit smoking about 16 years ago. Her smoking use included cigarettes. She has a  60.00 pack-year smoking history. She has never used smokeless tobacco. She reports that she does not drink alcohol or use drugs.  Allergies  Allergen Reactions  . Prednisone Shortness Of Breath, Itching, Nausea And Vomiting and Palpitations  . Solu-Medrol [Methylprednisolone] Anaphylaxis  . Amoxicillin Hives and Rash    Has patient had a PCN reaction causing immediate rash, facial/tongue/throat swelling, SOB or lightheadedness with hypotension:Yes Has patient had a PCN reaction causing severe rash involving mucus membranes or skin necrosis: NO Has patient had a PCN reaction that required hospitalization NO Has patient had a PCN reaction occurring within the last 10 years: NO If all of the above answers are "NO", then may proceed with Cephalosporin use.   . Codeine Hives, Itching and Rash  . Hydrocodone-Acetaminophen Hives, Itching and Rash  . Penicillins Hives, Itching and Rash    ALLERGIC REACTION TO ORAL AMOXICILLIN Has patient had a PCN reaction causing immediate rash, facial/tongue/throat swelling, SOB or lightheadedness with hypotension: Yes Has patient had a PCN reaction causing severe rash involving mucus membranes or skin necrosis: No Has patient had a PCN reaction that required hospitalization: No Has patient had a PCN reaction occurring within the last 10 years: No If all of the above answers are "NO", then may proceed with Cephalosporin use.    Family History  Problem Relation Age of Onset  . Heart attack Father   . Heart disease Father   . Throat cancer Brother   . Diabetes Mother   . Hypertension Mother   . Heart attack Mother   . Diabetes type II Brother   . Heart disease Brother   . Lung cancer Brother   . Breast cancer Cousin   . Lung cancer Daughter   . Lung cancer Paternal Uncle      Prior to Admission medications   Medication Sig Start Date End Date Taking? Authorizing Provider  acetaminophen (TYLENOL) 500 MG tablet Take 2 tablets (1,000 mg total) by  mouth every 6 (six) hours as needed. Patient taking differently: Take 1,000 mg by mouth every 6 (six) hours as needed for mild pain or headache.  09/13/15  Yes Pfeiffer, Jeannie Done, MD  atorvastatin (LIPITOR) 20 MG tablet TAKE 1 TABLET BY MOUTH ONCE DAILY 06/27/18  Yes [name], [name], MD  cetirizine (ZYRTEC) 10 MG tablet Take 10 mg by mouth at bedtime.    Yes [provider]  dexamethasone (DECADRON) 4 MG tablet Take 1 tablet (4 mg total) by mouth 2 (two) times daily. 07/31/18  Yes Martyn Ehrich, NP  diclofenac sodium (VOLTAREN) 1 % GEL Apply 3 grams to 3 large joints, up to 3 times daily. Patient taking differently: Apply 3 g topically See admin instructions. Apply 3 grams to 3 large joints, up to 3 times daily 01/29/18  Yes Deveshwar, Abel Presto, MD  docusate  sodium (COLACE) 100 MG capsule Take 100 mg by mouth at bedtime.    Yes [provider]  DULoxetine (CYMBALTA) 60 MG capsule Take 1 capsule (60 mg total) by mouth daily. 09/01/14  Yes Akintayo, Mojeed, MD  feeding supplement, ENSURE ENLIVE, (ENSURE ENLIVE) LIQD Take 237 mLs by mouth 3 (three) times daily between meals. 07/20/18  Yes [name], MD  fluticasone (FLONASE) 50 MCG/ACT nasal spray Place 1-2 sprays into both nostrils at bedtime.    Yes [provider]  guaiFENesin (MUCINEX) 600 MG 12 hr tablet Take 600 mg by mouth at bedtime.   Yes [provider]  ipratropium-albuterol (DUONEB) 0.5-2.5 (3) MG/3ML SOLN INHALE CONTENTS OF 1 VIAL THREE TIMES A DAY IN NEBULIZER Patient taking differently: Take 3 mLs by nebulization 3 (three) times daily.  05/06/18  Yes [name], MD  lamoTRIgine (LAMICTAL) 25 MG tablet Take 1 tablet (25 mg total) by mouth 2 (two) times daily. 08/02/18  Yes Shugart, Lissa Hoard, PA-C  linaclotide (LINZESS) 145 MCG CAPS capsule Take 145 mcg by mouth daily before breakfast.   Yes [provider]  losartan (COZAAR) 50 MG tablet TAKE 1 TABLET BY MOUTH ONCE DAILY 07/04/18  Yes [name],  [name], MD  montelukast (SINGULAIR) 10 MG tablet Take 1 tablet (10 mg total) at bedtime by mouth. 08/29/17  Yes [name], MD  Multiple Vitamins-Minerals (CENTRUM SILVER PO) Take 1 tablet by mouth daily.    Yes [provider]  NON FORMULARY FDgard: Take 1 capsule by mouth at bedtime   Yes [provider]  NON FORMULARY Respivest: Three times a day for 30 minutes   Yes [provider]  ondansetron (ZOFRAN-ODT) 4 MG disintegrating tablet Take 1 tablet (4 mg total) by mouth every 8 (eight) hours as needed for nausea or vomiting. 07/23/18  Yes Burchette, Alinda Sierras, MD  pantoprazole (PROTONIX) 40 MG tablet Take 40 mg by mouth daily.   Yes [provider]  Probiotic Product (ALIGN PO) Take 1 capsule by mouth daily with breakfast.    Yes [provider]  sodium chloride HYPERTONIC 3 % nebulizer solution Take by nebulization 2 (two) times daily. Patient taking differently: Take 4 mLs by nebulization 2 (two) times daily.  01/18/18  Yes [name], MD  sucralfate (CARAFATE) 1 GM/10ML suspension Take 10 mLs (1 g total) by mouth 2 (two) times daily. 07/20/18  Yes [name], MD  umeclidinium-vilanterol [facility] ELLIPTA) 62.5-25 MCG/INH AEPB Inhale 1 puff into the lungs daily. 07/26/18  Yes [name], MD  Blood Glucose Monitoring Suppl (ACCU-CHEK NANO SMARTVIEW) W/DEVICE KIT Use to check blood sugar 1-2 times daily Patient taking differently: 1 strip by Other route 2 (two) times a week.  11/26/14   [name], [name], MD  glucose blood test strip Accu-chek nano, test glucose once daily, dx E11.9 03/28/17   [name], [name], MD  Lancets Misc. (ACCU-CHEK FASTCLIX LANCET) KIT USE TO CHECK BLOOD SUGAR 1-2 TIMES DAILY 12/28/15   [name], [name], MD  Respiratory Therapy Supplies (FLUTTER) DEVI 1 Device by Does not apply route as directed. Patient taking differently: 1 Device as directed.  10/24/17   Melvenia Needles, NP    Physical Exam: Vitals:   08/06/18  2100 08/06/18 2130 08/06/18 2200 08/06/18 2248  BP: (!) 141/77 (!) 134/53 (!) 128/93 134/72  Pulse: 94 91 80 91  Resp: (!) 24 19 (!) 23 18  Temp:    99.5 F (37.5 C)  TempSrc:    Oral  SpO2: 95% 94% 96% 98%  Weight:      Height:          Constitutional: NAD, calm, comfortable Vitals:   08/06/18 2100 08/06/18 2130 08/06/18 2200 08/06/18 2248  BP: (!) 141/77 (!) 134/53 (!) 128/93 134/72  Pulse: 94 91 80 91  Resp: (!) 24 19 (!) 23 18  Temp:    99.5 F (37.5 C)  TempSrc:    Oral  SpO2: 95% 94% 96% 98%  Weight:      Height:       Eyes: PERRL, lids and conjunctivae normal ENMT: Mucous membranes are moist. Posterior pharynx clear of any exudate or lesions.Normal dentition.  Neck: normal, supple, no masses, no thyromegaly Respiratory: clear to auscultation bilaterally, no wheezing, no crackles. Normal respiratory effort. No accessory muscle use.  Cardiovascular: Regular rate and rhythm, no murmurs / rubs / gallops. No extremity edema. 2+ pedal pulses. No carotid bruits.  Abdomen: no tenderness, no masses palpated. No hepatosplenomegaly. Bowel sounds positive.  Musculoskeletal: no clubbing / cyanosis. No joint deformity upper and lower extremities. Good ROM, no contractures. Normal muscle tone.  Skin: no rashes, lesions, ulcers. No induration Neurologic: CN 2-12 grossly intact. Sensation intact, DTR normal. Strength 5/5 in all 4.  Psychiatric: Normal judgment and insight. Alert and oriented x 3. Normal mood.     Labs on Admission: I have personally reviewed following labs and imaging studies  CBC: Recent Labs  Lab 08/06/18 1438  WBC 17.9*  NEUTROABS 9.3*  HGB 12.4  HCT 38.9  MCV 90.3  PLT 101.7*   Basic Metabolic Panel: Recent Labs  Lab 08/06/18 1438  NA 139  K 4.1  CL 101  CO2 24  GLUCOSE 197*  BUN 34*  CREATININE 1.73*  CALCIUM 9.1   GFR: Estimated Creatinine Clearance: 26.5 mL/min (A) (by C-G formula based on SCr of 1.73 mg/dL (H)). Liver Function  Tests: Recent Labs  Lab 08/06/18 2029  AST 25  ALT 20  ALKPHOS 87  BILITOT 0.6  PROT 6.3*  ALBUMIN 2.5*   Recent Labs  Lab 08/06/18 2029  LIPASE 42   No results for input(s): AMMONIA in the last 168 hours. Coagulation Profile: No results for input(s): INR, PROTIME in the last 168 hours. Cardiac Enzymes: No results for input(s): CKTOTAL, CKMB, CKMBINDEX, TROPONINI in the last 168 hours. BNP (last 3 results) No results for input(s): PROBNP in the last 8760 hours. HbA1C: No results for input(s): HGBA1C in the last 72 hours. CBG: No results for input(s): GLUCAP in the last 168 hours. Lipid Profile: No results for input(s): CHOL, HDL, LDLCALC, TRIG, CHOLHDL, LDLDIRECT in the last 72 hours. Thyroid Function Tests: No results for input(s): TSH, T4TOTAL, FREET4, T3FREE, THYROIDAB in the last 72 hours. Anemia Panel: No results for input(s): VITAMINB12, FOLATE, FERRITIN, TIBC, IRON, RETICCTPCT in the last 72 hours. Urine analysis:    Component Value Date/Time   COLORURINE YELLOW 08/06/2018 1932   APPEARANCEUR CLEAR 08/06/2018 1932   LABSPEC >1.046 (H) 08/06/2018 1932   PHURINE 6.0 08/06/2018 1932   GLUCOSEU NEGATIVE 08/06/2018 [place] NEGATIVE 08/06/2018 1932   HGBUR negative 03/11/2008 [place] 08/06/2018 1932   BILIRUBINUR n 08/07/2017 1558   KETONESUR 5 (A) 08/06/2018 1932   PROTEINUR 100 (A) 08/06/2018 1932   UROBILINOGEN 1.0 08/07/2017 1558   UROBILINOGEN 0.2 08/16/2014 1513   NITRITE NEGATIVE 08/06/2018 1932   LEUKOCYTESUR NEGATIVE 08/06/2018 1932   Sepsis Labs: @  LABRCNTIP(procalcitonin:4,lacticidven:4) )No results found for this or any previous visit (from the past 240 hour(s)).   Radiological Exams on Admission: Dg Chest 2 View  Result Date: 08/06/2018 CLINICAL DATA:  Mucus in congestion of the chest. Follow-up pneumonia. EXAM: CHEST - 2 VIEW COMPARISON:  07/30/2018 FINDINGS: Clearing of right basilar atelectasis and/or pneumonia since  prior. No new pulmonary consolidation. Heart and mediastinal contours are stable with mild aortic atherosclerosis. No aneurysm. Partially included lower cervical spine ACDF hardware is identified. No acute nor suspicious osseous abnormalities. IMPRESSION: Clearing of right basilar atelectasis and/or pneumonia since prior. No new pulmonary consolidation or CHF. Aortic atherosclerosis (ICD10-I70.0) Electronically Signed   By: Ashley Royalty M.D.   On: 08/06/2018 14:32   Ct Abdomen Pelvis W Contrast  Result Date: 08/06/2018 CLINICAL DATA:  Upper abdominal pain with elevated white blood cell count EXAM: CT ABDOMEN AND PELVIS WITH CONTRAST TECHNIQUE: Multidetector CT imaging of the abdomen and pelvis was performed using the standard protocol following bolus administration of intravenous contrast. CONTRAST:  45m OMNIPAQUE IOHEXOL 300 MG/ML  SOLN COMPARISON:  07/18/2018 FINDINGS: Lower chest: Previously seen right-sided effusion and right lower lobe consolidation has resolved in the interval. No acute abnormality is noted. Hepatobiliary: Mild fatty infiltration of the liver is noted. The gallbladder is well distended without focal abnormality. Pancreas: Unremarkable. No pancreatic ductal dilatation or surrounding inflammatory changes. Spleen: Normal in size without focal abnormality. Adrenals/Urinary Tract: Adrenal glands are within normal limits. The kidneys demonstrate a normal enhancement pattern without evidence of obstructive change. Tiny nonobstructing left renal stone is again noted and stable. The bladder is partially distended. Stomach/Bowel: The appendix is within normal limits. No obstructive or inflammatory changes of the bowel are seen. Vascular/Lymphatic: Aortic atherosclerosis. No enlarged abdominal or pelvic lymph nodes. Reproductive: Some uterine calcifications are noted likely related to mild fibroid change. Other: No abdominal wall hernia or abnormality. No abdominopelvic ascites. Musculoskeletal:  Degenerative changes and postoperative changes of lumbar spine are seen and stable. Mild anterolisthesis of L5 on S1 is again noted and stable. IMPRESSION: Resolution of previously seen right basilar changes. Stable nonobstructing left renal stone No acute abnormality is identified to correspond with the patient's given clinical history. Electronically Signed   By: MInez CatalinaM.D.   On: 08/06/2018 18:32    EKG: Independently reviewed.  It shows normal sinus rhythm with a rate of 98, T wave inversions in the inferolateral leads which was apparently old.  Assessment/Plan Principal Problem:   Nausea and vomiting Active Problems:   Essential hypertension   GERD   CHEST PAIN, RECURRENT   Diabetes mellitus with renal manifestations, [place]   Hyperlipidemia     #1 intractable nausea and vomiting: Possible GI related due to severe GERD but also had some diaphoresis with epigastric pain and minimally elevated troponin.  We will cycle enzymes, symptomatic treatment for the nausea vomiting, PPIs, consider GI consult if enzymes remain negative.  #2 hypotension: Blood pressure was low.  Hold blood pressure medications while blood pressure is low.  Resume when stabilized.  #3 GERD: Continue PPIs  #4 diabetes: Sliding scale insulin.  Continue close management.  #5 hyperlipidemia: Continue statin.  #6 COPD: No COPD exacerbation.  Continue management.   DVT prophylaxis: Lovenox Code Status: Full code Family Communication: No family physically available Disposition Plan: Home Consults called: Cardiology was consulted by ER.  No consult recommended. Admission status: Observation  Severity of Illness: The appropriate patient status for this patient is OBSERVATION. Observation status is judged  to be reasonable and necessary in order to provide the required intensity of service to ensure the patient's safety. The patient's presenting symptoms, physical exam findings, and initial  radiographic and laboratory data in the context of their medical condition is felt to place them at decreased risk for further clinical deterioration. Furthermore, it is anticipated that the patient will be medically stable for discharge from the hospital within 2 midnights of admission. The following factors support the patient status of observation.   " The patient's presenting symptoms include nausea vomiting and epigastric pain. " The physical exam findings include hypotension. " The initial radiographic and laboratory data are troponin of 0.15.     Barbette Merino MD [facility] Pager [phone]  If 7PM-7AM, please contact night-coverage [email or web address] Password [email or web address]  08/06/2018, 11:00 PM

## 2018-08-06 NOTE — ED Provider Notes (Signed)
[facility] DEPT Provider Note   CSN: [id number] Arrival date & time: 08/06/18  1540     History   Chief Complaint Chief Complaint  Patient presents with  . Abnormal Lab    HPI Desiree [name] is a 67 y.o. female.  67 yo F with a cc of hypertension and tachycardia.  The patient went to see her pulmonologist today and was sent here after her blood pressure was 88/40 with a heart rate in the 130s.  The patient states that she has been having vomiting for the past for 5 days.  She has not had any oral intake until today.  She had some Jell-O earlier and was able to keep that down after she took some Protonix.  She denies fevers or chills.  Feels that her shortness of breath is baseline.  Has had a cough but thinks it is improving.  She feels that her urine smells funny.  Denies dysuria increased frequency hesitancy or flank pain.  The history is provided by the patient.  Illness  This is a new problem. The current episode started yesterday. The problem occurs constantly. The problem has not changed since onset.Associated symptoms include abdominal pain. Pertinent negatives include no chest pain, no headaches and no shortness of breath. Nothing aggravates the symptoms. Nothing relieves the symptoms. She has tried nothing for the symptoms. The treatment provided no relief.    Past Medical History:  Diagnosis Date  . Anxiety   . Bipolar disorder ([place])   . CANDIDIASIS, ESOPHAGEAL 07/28/2009   Qualifier: Diagnosis of  By: [name] MD, [name]   . Chronic kidney disease    CKD III  . COPD (chronic obstructive pulmonary disease) ([place])   . Depression   . Diabetes mellitus without complication (HCC)    no meds  . FH: colonic polyps   . Fractured elbow    right   . GERD (gastroesophageal reflux disease)   . HH (hiatus hernia)   . History of carpal tunnel syndrome   . History of chest pain   . History of transfusion of packed red blood cells   .  Hyperlipidemia   . Hypertension   . Neuroleptic-induced tardive dyskinesia   . Osteoarthritis of more than one site   . Seasonal allergies     Patient Active Problem List   Diagnosis Date Noted  . Nausea and vomiting 08/06/2018  . Pneumonia 07/12/2018  . COPD exacerbation ([place]) 07/11/2018  . Primary osteoarthritis of both feet 12/21/2017  . DDD (degenerative disc disease), lumbar 12/21/2017  . Primary osteoarthritis of both knees 12/21/2017  . History of bilateral carpal tunnel release 12/21/2017  . DDD (degenerative disc disease), cervical 11/15/2017  . Former smoker 11/15/2017  . Bronchiectasis without complication ([place]) 50/56/9794  . Impingement syndrome of right shoulder 07/25/2017  . Hx of fusion of cervical spine 07/25/2017  . Cervical spinal stenosis 09/27/2016  . Closed displaced fracture of proximal phalanx of lesser toe of right foot 09/08/2016  . Spinal stenosis of cervical region 09/08/2016  . Polypharmacy 06/23/2015  . Hyperlipidemia 04/19/2015  . Visit for preventive health examination 01/01/2015  . Primary osteoarthritis involving multiple joints 01/01/2015  . Bipolar affective disorder, currently depressed, moderate ([place])   . Bipolar I disorder with mania ([place]) 08/17/2014  . Foot cramps 07/03/2014  . Hypertension 10/21/2013  . Ear fullness 10/21/2013  . Dizziness 03/25/2013  . Medication withdrawal ([place]) 03/05/2013  . Diabetes mellitus with renal manifestations, controlled ([place]) 11/10/2012  .  Alopecia areata 02/06/2012  . Obesity (BMI 30-39.9) 02/06/2012  . Renal insufficiency 07/16/2011  . Neuroleptic-induced tardive dyskinesia   . Exertional dyspnea 02/07/2011  . Dyspnea on exertion 01/19/2011  . DIABETES MELLITUS, TYPE II, CONTROLLED 04/22/2010  . Carpal tunnel syndrome 02/02/2010  . CONSTIPATION 02/02/2010  . Primary osteoarthritis of both hands 02/02/2010  . CATARACTS 04/13/2009  . PAIN IN JOINT, ANKLE AND FOOT 09/16/2008  . EOSINOPHILIA 07/21/2008   . AFFECTIVE DISORDER 04/21/2008  . BACK PAIN, CHRONIC 02/12/2008  . LEG PAIN 02/12/2008  . ABNORMAL INVOLUNTARY MOVEMENTS 12/04/2007  . POSTURAL LIGHTHEADEDNESS 11/04/2007  . COLONIC POLYPS, HX OF 11/04/2007  . Essential hypertension 06/17/2007  . HYPERLIPIDEMIA 04/08/2007  . MITRAL VALVE PROLAPSE 04/08/2007  . GERD 04/08/2007  . LOW BACK PAIN SYNDROME 04/08/2007  . CHEST PAIN, RECURRENT 04/08/2007    Past Surgical History:  Procedure Laterality Date  . ANTERIOR CERVICAL DECOMP/DISCECTOMY FUSION  09/27/2016   C5-6 anterior cervical discectomy and fusion, allograft and plate/notes 09/27/2016  . ANTERIOR CERVICAL DECOMP/DISCECTOMY FUSION N/A 09/27/2016   Procedure: C5-6 Anterior Cervical Discectomy and Fusion, Allograft and Plate;  Surgeon: [name], MD;  Location: [facility];  Service: Orthopedics;  Laterality: N/A;  . Back Fusion  2002  . BIOPSY  07/19/2018   Procedure: BIOPSY;  Surgeon: [name], MD;  Location: [facility];  Service: Endoscopy;;  . CARPAL TUNNEL RELEASE  yates   left  . COLONOSCOPY N/A 01/05/2014   Procedure: COLONOSCOPY;  Surgeon: [name], MD;  Location: [facility];  Service: Endoscopy;  Laterality: N/A;  . ELBOW SURGERY     age 74  . ESOPHAGOGASTRODUODENOSCOPY (EGD) WITH PROPOFOL N/A 07/19/2018   Procedure: ESOPHAGOGASTRODUODENOSCOPY (EGD) WITH PROPOFOL;  Surgeon: [name], MD;  Location: [facility];  Service: Endoscopy;  Laterality: N/A;  . EXTERNAL EAR SURGERY Left   . EYE SURGERY     "removed white dots under eyelid"  . FINGER SURGERY Left   . Juvara osteomy    . KNEE SURGERY    . NOSE SURGERY    . Rt. toe bunion    . skin, shave biopsy  05/03/2016   Left occipital scalp, top of scalp     OB History   None      Home Medications    Prior to Admission medications   Medication Sig Start Date End Date Taking? Authorizing Provider  acetaminophen (TYLENOL) 500 MG tablet Take 2 tablets (1,000 mg total) by mouth every 6 (six) hours as  needed. Patient taking differently: Take 1,000 mg by mouth every 6 (six) hours as needed for mild pain or headache.  09/13/15  Yes Pfeiffer, Jeannie Done, MD  atorvastatin (LIPITOR) 20 MG tablet TAKE 1 TABLET BY MOUTH ONCE DAILY 06/27/18  Yes [name], [name], MD  cetirizine (ZYRTEC) 10 MG tablet Take 10 mg by mouth at bedtime.    Yes [provider]  dexamethasone (DECADRON) 4 MG tablet Take 1 tablet (4 mg total) by mouth 2 (two) times daily. 07/31/18  Yes Martyn Ehrich, NP  diclofenac sodium (VOLTAREN) 1 % GEL Apply 3 grams to 3 large joints, up to 3 times daily. Patient taking differently: Apply 3 g topically See admin instructions. Apply 3 grams to 3 large joints, up to 3 times daily 01/29/18  Yes Deveshwar, Abel Presto, MD  docusate sodium (COLACE) 100 MG capsule Take 100 mg by mouth at bedtime.    Yes [provider]  DULoxetine (CYMBALTA) 60 MG capsule Take 1 capsule (60 mg total)  by mouth daily. 09/01/14  Yes Akintayo, Mojeed, MD  feeding supplement, ENSURE ENLIVE, (ENSURE ENLIVE) LIQD Take 237 mLs by mouth 3 (three) times daily between meals. 07/20/18  Yes [name], MD  fluticasone (FLONASE) 50 MCG/ACT nasal spray Place 1-2 sprays into both nostrils at bedtime.    Yes [provider]  guaiFENesin (MUCINEX) 600 MG 12 hr tablet Take 600 mg by mouth at bedtime.   Yes [provider]  ipratropium-albuterol (DUONEB) 0.5-2.5 (3) MG/3ML SOLN INHALE CONTENTS OF 1 VIAL THREE TIMES A DAY IN NEBULIZER Patient taking differently: Take 3 mLs by nebulization 3 (three) times daily.  05/06/18  Yes [name], MD  lamoTRIgine (LAMICTAL) 25 MG tablet Take 1 tablet (25 mg total) by mouth 2 (two) times daily. 08/02/18  Yes Shugart, Lissa Hoard, PA-C  linaclotide (LINZESS) 145 MCG CAPS capsule Take 145 mcg by mouth daily before breakfast.   Yes [provider]  losartan (COZAAR) 50 MG tablet TAKE 1 TABLET BY MOUTH ONCE DAILY 07/04/18  Yes [name], [name], MD  montelukast  (SINGULAIR) 10 MG tablet Take 1 tablet (10 mg total) at bedtime by mouth. 08/29/17  Yes [name], MD  Multiple Vitamins-Minerals (CENTRUM SILVER PO) Take 1 tablet by mouth daily.    Yes [provider]  NON FORMULARY FDgard: Take 1 capsule by mouth at bedtime   Yes [provider]  NON FORMULARY Respivest: Three times a day for 30 minutes   Yes [provider]  ondansetron (ZOFRAN-ODT) 4 MG disintegrating tablet Take 1 tablet (4 mg total) by mouth every 8 (eight) hours as needed for nausea or vomiting. 07/23/18  Yes Burchette, Alinda Sierras, MD  pantoprazole (PROTONIX) 40 MG tablet Take 40 mg by mouth daily.   Yes [provider]  Probiotic Product (ALIGN PO) Take 1 capsule by mouth daily with breakfast.    Yes [provider]  sodium chloride HYPERTONIC 3 % nebulizer solution Take by nebulization 2 (two) times daily. Patient taking differently: Take 4 mLs by nebulization 2 (two) times daily.  01/18/18  Yes [name], MD  sucralfate (CARAFATE) 1 GM/10ML suspension Take 10 mLs (1 g total) by mouth 2 (two) times daily. 07/20/18  Yes [name], MD  umeclidinium-vilanterol [facility] ELLIPTA) 62.5-25 MCG/INH AEPB Inhale 1 puff into the lungs daily. 07/26/18  Yes [name], MD  Blood Glucose Monitoring Suppl (ACCU-CHEK NANO SMARTVIEW) W/DEVICE KIT Use to check blood sugar 1-2 times daily Patient taking differently: 1 strip by Other route 2 (two) times a week.  11/26/14   [name], [name], MD  glucose blood test strip Accu-chek nano, test glucose once daily, dx E11.9 03/28/17   [name], [name], MD  Lancets Misc. (ACCU-CHEK FASTCLIX LANCET) KIT USE TO CHECK BLOOD SUGAR 1-2 TIMES DAILY 12/28/15   [name], [name], MD  Respiratory Therapy Supplies (FLUTTER) DEVI 1 Device by Does not apply route as directed. Patient taking differently: 1 Device as directed.  10/24/17   Parrett, Fonnie Mu, NP    Family History Family History  Problem Relation Age of Onset   . Heart attack Father   . Heart disease Father   . Throat cancer Brother   . Diabetes Mother   . Hypertension Mother   . Heart attack Mother   . Diabetes type II Brother   . Heart disease Brother   . Lung cancer Brother   . Breast cancer Cousin   . Lung cancer Daughter   .  Lung cancer Paternal Uncle     Social History Social History   Tobacco Use  . Smoking status: Former Smoker    Packs/day: 2.00    Years: 30.00    Pack years: 60.00    Types: Cigarettes    Last attempt to quit: 10/23/2001    Years since quitting: 16.7  . Smokeless tobacco: Never Used  Substance Use Topics  . Alcohol use: No  . Drug use: No     Allergies   Prednisone; Solu-medrol [methylprednisolone]; Amoxicillin; Codeine; Hydrocodone-acetaminophen; and Penicillins   Review of Systems Review of Systems  Constitutional: Negative for chills and fever.  HENT: Negative for congestion and rhinorrhea.   Eyes: Negative for redness and visual disturbance.  Respiratory: Negative for shortness of breath and wheezing.   Cardiovascular: Negative for chest pain and palpitations.  Gastrointestinal: Positive for abdominal pain, nausea and vomiting.  Genitourinary: Negative for dysuria and urgency.  Musculoskeletal: Negative for arthralgias and myalgias.  Skin: Negative for pallor and wound.  Neurological: Negative for dizziness and headaches.     Physical Exam Updated Vital Signs BP 134/72 (BP Location: Right Arm)   Pulse 91   Temp 99.5 F (37.5 C) (Oral)   Resp 18   Ht 5' 3"  (1.6 m)   Wt 60.3 kg   SpO2 98%   BMI 23.56 kg/m   Physical Exam  Constitutional: She is oriented to person, place, and time. She appears well-developed and well-nourished. No distress.  HENT:  Head: Normocephalic and atraumatic.  Eyes: Pupils are equal, round, and reactive to light. EOM are normal.  Neck: Normal range of motion. Neck supple.  Cardiovascular: Normal rate and regular rhythm. Exam reveals no gallop and no  friction rub.  No murmur heard. Pulmonary/Chest: Effort normal. She has no wheezes. She has no rales.  Abdominal: Soft. She exhibits no distension and no mass. There is tenderness (diffuse). There is no guarding.  Musculoskeletal: She exhibits no edema or tenderness.  Neurological: She is alert and oriented to person, place, and time.  Skin: Skin is warm and dry. She is not diaphoretic.  Psychiatric: She has a normal mood and affect. Her behavior is normal.  Nursing note and vitals reviewed.    ED Treatments / Results  Labs (all labs ordered are listed, but only abnormal results are displayed) Labs Reviewed  HEPATIC FUNCTION PANEL - Abnormal; Notable for the following components:      Result Value   Total Protein 6.3 (*)    Albumin 2.5 (*)    All other components within normal limits  URINALYSIS, ROUTINE W REFLEX MICROSCOPIC - Abnormal; Notable for the following components:   Specific Gravity, Urine >1.046 (*)    Ketones, ur 5 (*)    Protein, ur 100 (*)    Bacteria, UA RARE (*)    All other components within normal limits  I-STAT TROPONIN, ED - Abnormal; Notable for the following components:   Troponin i, poc 0.15 (*)    All other components within normal limits  LIPASE, BLOOD  CBC  CREATININE, SERUM  COMPREHENSIVE METABOLIC PANEL  CBC  TROPONIN I  TROPONIN I  TROPONIN I  TSH  I-STAT CG4 LACTIC ACID, ED  I-STAT CG4 LACTIC ACID, ED    EKG EKG Interpretation  Date/Time:  Tuesday August 06 2018 20:35:19 EDT Ventricular Rate:  98 PR Interval:    QRS Duration: 83 QT Interval:  337 QTC Calculation: 431 R Axis:     Text Interpretation:  ? worsening ST  changes inferiorly Otherwise no significant change Confirmed by Deno Etienne [phone]) on 08/06/2018 8:53:56 PM   Radiology Dg Chest 2 View  Result Date: 08/06/2018 CLINICAL DATA:  Mucus in congestion of the chest. Follow-up pneumonia. EXAM: CHEST - 2 VIEW COMPARISON:  07/30/2018 FINDINGS: Clearing of right basilar  atelectasis and/or pneumonia since prior. No new pulmonary consolidation. Heart and mediastinal contours are stable with mild aortic atherosclerosis. No aneurysm. Partially included lower cervical spine ACDF hardware is identified. No acute nor suspicious osseous abnormalities. IMPRESSION: Clearing of right basilar atelectasis and/or pneumonia since prior. No new pulmonary consolidation or CHF. Aortic atherosclerosis (ICD10-I70.0) Electronically Signed   By: Ashley Royalty M.D.   On: 08/06/2018 14:32   Ct Abdomen Pelvis W Contrast  Result Date: 08/06/2018 CLINICAL DATA:  Upper abdominal pain with elevated white blood cell count EXAM: CT ABDOMEN AND PELVIS WITH CONTRAST TECHNIQUE: Multidetector CT imaging of the abdomen and pelvis was performed using the standard protocol following bolus administration of intravenous contrast. CONTRAST:  12m OMNIPAQUE IOHEXOL 300 MG/ML  SOLN COMPARISON:  07/18/2018 FINDINGS: Lower chest: Previously seen right-sided effusion and right lower lobe consolidation has resolved in the interval. No acute abnormality is noted. Hepatobiliary: Mild fatty infiltration of the liver is noted. The gallbladder is well distended without focal abnormality. Pancreas: Unremarkable. No pancreatic ductal dilatation or surrounding inflammatory changes. Spleen: Normal in size without focal abnormality. Adrenals/Urinary Tract: Adrenal glands are within normal limits. The kidneys demonstrate a normal enhancement pattern without evidence of obstructive change. Tiny nonobstructing left renal stone is again noted and stable. The bladder is partially distended. Stomach/Bowel: The appendix is within normal limits. No obstructive or inflammatory changes of the bowel are seen. Vascular/Lymphatic: Aortic atherosclerosis. No enlarged abdominal or pelvic lymph nodes. Reproductive: Some uterine calcifications are noted likely related to mild fibroid change. Other: No abdominal wall hernia or abnormality. No  abdominopelvic ascites. Musculoskeletal: Degenerative changes and postoperative changes of lumbar spine are seen and stable. Mild anterolisthesis of L5 on S1 is again noted and stable. IMPRESSION: Resolution of previously seen right basilar changes. Stable nonobstructing left renal stone No acute abnormality is identified to correspond with the patient's given clinical history. Electronically Signed   By: MInez CatalinaM.D.   On: 08/06/2018 18:32    Procedures Procedures (including critical care time)  Medications Ordered in ED Medications  ondansetron (ZOFRAN) injection 4 mg (4 mg Intravenous Refused 08/06/18 1633)  morphine 2 MG/ML injection 2 mg (2 mg Intravenous Refused 08/06/18 1633)  sodium chloride 0.9 % injection (has no administration in time range)  lactated ringers infusion ( Intravenous New Bag/Given 08/06/18 2152)  pantoprazole (PROTONIX) EC tablet 40 mg (has no administration in time range)  DULoxetine (CYMBALTA) DR capsule 60 mg (has no administration in time range)  loratadine (CLARITIN) tablet 10 mg (has no administration in time range)  acetaminophen (TYLENOL) tablet 1,000 mg (has no administration in time range)  bifidobacterium infantis (ALIGN) capsule 1 capsule (has no administration in time range)  docusate sodium (COLACE) capsule 100 mg (has no administration in time range)  fluticasone (FLONASE) 50 MCG/ACT nasal spray 1-2 spray (has no administration in time range)  montelukast (SINGULAIR) tablet 10 mg (has no administration in time range)  diclofenac sodium (VOLTAREN) 1 % transdermal gel 4 g (has no administration in time range)  ipratropium-albuterol (DUONEB) 0.5-2.5 (3) MG/3ML nebulizer solution 3 mL (has no administration in time range)  atorvastatin (LIPITOR) tablet 20 mg (has no administration in time range)  losartan (COZAAR)  tablet 50 mg (has no administration in time range)  linaclotide (LINZESS) capsule 145 mcg (has no administration in time range)    guaiFENesin (MUCINEX) 12 hr tablet 600 mg (has no administration in time range)  feeding supplement (ENSURE ENLIVE) (ENSURE ENLIVE) liquid 237 mL (has no administration in time range)  sucralfate (CARAFATE) 1 GM/10ML suspension 1 g (has no administration in time range)  ondansetron (ZOFRAN-ODT) disintegrating tablet 4 mg (has no administration in time range)  umeclidinium-vilanterol (ANORO ELLIPTA) 62.5-25 MCG/INH 1 puff (has no administration in time range)  dexamethasone (DECADRON) tablet 4 mg (has no administration in time range)  lamoTRIgine (LAMICTAL) tablet 25 mg (has no administration in time range)  insulin aspart (novoLOG) injection 0-9 Units (has no administration in time range)  insulin aspart (novoLOG) injection 0-5 Units (has no administration in time range)  enoxaparin (LOVENOX) injection 40 mg (has no administration in time range)  0.9 %  sodium chloride infusion (has no administration in time range)  ondansetron (ZOFRAN) tablet 4 mg (has no administration in time range)    Or  ondansetron (ZOFRAN) injection 4 mg (has no administration in time range)  sodium chloride 0.9 % bolus 1,000 mL (0 mLs Intravenous Stopped 08/06/18 1707)  iohexol (OMNIPAQUE) 300 MG/ML solution 75 mL (75 mLs Intravenous Contrast Given 08/06/18 1751)  alum & mag hydroxide-simeth (MAALOX/MYLANTA) 200-200-20 MG/5ML suspension 15 mL (15 mLs Oral Given 08/06/18 2036)  sodium chloride 0.9 % bolus 1,000 mL (0 mLs Intravenous Stopped 08/06/18 2150)  aspirin chewable tablet 324 mg (324 mg Oral Given 08/06/18 2150)     Initial Impression / Assessment and Plan / ED Course  I have reviewed the triage vital signs and the nursing notes.  Pertinent labs & imaging results that were available during my care of the patient were reviewed by me and considered in my medical decision making (see chart for details).     67 yo F with a chief complaint of abdominal pain nausea and vomiting.  Going on for the past for 5  days.  Saw her pulmonologist in the office today and was noted to have a blood pressure in the 80s with a heart rate in the 130s and was sent here for evaluation.  Her initial blood pressure is 125/77.  She still is persistently tachycardic.  She told me she has not had any oral intake for 4 days and then had some Jell-O today.  I suspect that she is dehydrated.  With her diffuse abdominal pain though I feel that I should obtain a CT scan.  She had labs done in the office and has a persistent leukocytosis that significantly improved from 44,017.  Her renal function is mildly worse gone from about a baseline of 1-1/2-1.7.  CT scan without concerning finding inside the abdomen or pelvis.  However back to reassess the patient and she was having some epigastric discomfort.  Michela Pitcher this feels like her prior reflux.  EKG was performed as well as a troponin.  The troponin was positive at 0.15.  Her EKG to me showed some subtle findings of the ST segments in the inferior leads.  I discussed this case with Dr. Paticia Stack, cardiology fellow on call.  He reviewed the patient's chart and there are most recent echo.  The patient shows some concerns for HOCM, he felt that she is likely extremely preload dependent and if she was hypovolemic that she may have some significant hypotension.  Based on her history he felt this was unlikely  to be primary cardiac ischemia.  Recommended cycling her enzymes.  Did not recommend heparin at this time.  Felt that she was okay for admission at [facility].   CRITICAL CARE Performed by: Cecilio Asper   Total critical care time: 80 minutes  Critical care time was exclusive of separately billable procedures and treating other patients.  Critical care was necessary to treat or prevent imminent or life-threatening deterioration.  Critical care was time spent personally by me on the following activities: development of treatment plan with patient and/or surrogate as well as nursing,  discussions with consultants, evaluation of patient's response to treatment, examination of patient, obtaining history from patient or surrogate, ordering and performing treatments and interventions, ordering and review of laboratory studies, ordering and review of radiographic studies, pulse oximetry and re-evaluation of patient's condition.  The patients results and plan were reviewed and discussed.   Any x-rays performed were independently reviewed by myself.   Differential diagnosis were considered with the presenting HPI.  Medications  ondansetron (ZOFRAN) injection 4 mg (4 mg Intravenous Refused 08/06/18 1633)  morphine 2 MG/ML injection 2 mg (2 mg Intravenous Refused 08/06/18 1633)  sodium chloride 0.9 % injection (has no administration in time range)  lactated ringers infusion ( Intravenous New Bag/Given 08/06/18 2152)  pantoprazole (PROTONIX) EC tablet 40 mg (has no administration in time range)  DULoxetine (CYMBALTA) DR capsule 60 mg (has no administration in time range)  loratadine (CLARITIN) tablet 10 mg (has no administration in time range)  acetaminophen (TYLENOL) tablet 1,000 mg (has no administration in time range)  bifidobacterium infantis (ALIGN) capsule 1 capsule (has no administration in time range)  docusate sodium (COLACE) capsule 100 mg (has no administration in time range)  fluticasone (FLONASE) 50 MCG/ACT nasal spray 1-2 spray (has no administration in time range)  montelukast (SINGULAIR) tablet 10 mg (has no administration in time range)  diclofenac sodium (VOLTAREN) 1 % transdermal gel 4 g (has no administration in time range)  ipratropium-albuterol (DUONEB) 0.5-2.5 (3) MG/3ML nebulizer solution 3 mL (has no administration in time range)  atorvastatin (LIPITOR) tablet 20 mg (has no administration in time range)  losartan (COZAAR) tablet 50 mg (has no administration in time range)  linaclotide (LINZESS) capsule 145 mcg (has no administration in time range)    guaiFENesin (MUCINEX) 12 hr tablet 600 mg (has no administration in time range)  feeding supplement (ENSURE ENLIVE) (ENSURE ENLIVE) liquid 237 mL (has no administration in time range)  sucralfate (CARAFATE) 1 GM/10ML suspension 1 g (has no administration in time range)  ondansetron (ZOFRAN-ODT) disintegrating tablet 4 mg (has no administration in time range)  umeclidinium-vilanterol (ANORO ELLIPTA) 62.5-25 MCG/INH 1 puff (has no administration in time range)  dexamethasone (DECADRON) tablet 4 mg (has no administration in time range)  lamoTRIgine (LAMICTAL) tablet 25 mg (has no administration in time range)  insulin aspart (novoLOG) injection 0-9 Units (has no administration in time range)  insulin aspart (novoLOG) injection 0-5 Units (has no administration in time range)  enoxaparin (LOVENOX) injection 40 mg (has no administration in time range)  0.9 %  sodium chloride infusion (has no administration in time range)  ondansetron (ZOFRAN) tablet 4 mg (has no administration in time range)    Or  ondansetron (ZOFRAN) injection 4 mg (has no administration in time range)  sodium chloride 0.9 % bolus 1,000 mL (0 mLs Intravenous Stopped 08/06/18 1707)  iohexol (OMNIPAQUE) 300 MG/ML solution 75 mL (75 mLs Intravenous Contrast Given 08/06/18 1751)  alum &  mag hydroxide-simeth (MAALOX/MYLANTA) 200-200-20 MG/5ML suspension 15 mL (15 mLs Oral Given 08/06/18 2036)  sodium chloride 0.9 % bolus 1,000 mL (0 mLs Intravenous Stopped 08/06/18 2150)  aspirin chewable tablet 324 mg (324 mg Oral Given 08/06/18 2150)    Vitals:   08/06/18 2100 08/06/18 2130 08/06/18 2200 08/06/18 2248  BP: (!) 141/77 (!) 134/53 (!) 128/93 134/72  Pulse: 94 91 80 91  Resp: (!) 24 19 (!) 23 18  Temp:    99.5 F (37.5 C)  TempSrc:    Oral  SpO2: 95% 94% 96% 98%  Weight:      Height:        Final diagnoses:  Nausea and vomiting in adult  Elevated troponin    Admission/ observation were discussed with the admitting  physician, patient and/or family and they are comfortable with the plan.    Final Clinical Impressions(s) / ED Diagnoses   Final diagnoses:  Nausea and vomiting in adult  Elevated troponin    ED Discharge Orders    None       Deno Etienne, DO 08/06/18 2318

## 2018-08-06 NOTE — ED Notes (Signed)
ED TO INPATIENT HANDOFF REPORT  Name/Age/Gender Desiree [name] 67 y.o. female  Code Status Code Status History    Date Active Date Inactive Code Status Order ID Comments User Context   07/12/2018 0033 07/20/2018 2239 Full Code [id number]  Onnie Boer, MD Inpatient   09/27/2016 1834 09/28/2016 1517 Full Code [id number]  Naida Sleight, PA-C Inpatient   08/31/2014 0125 09/01/2014 1521 Full Code [id number]  Rolan Bucco, MD ED   08/16/2014 1725 08/18/2014 1545 Full Code [id number]  Mora Bellman, PA-C ED    Advance Directive Documentation     Most Recent Value  Type of Advance Directive  Healthcare Power of Attorney  Pre-existing out of facility DNR order (yellow form or pink MOST form)  -  "MOST" Form in Place?  -      Home/SNF/Other Home  Chief Complaint abnormal lab   Level of Care/Admitting Diagnosis ED Disposition    ED Disposition Condition Comment   Admit  Hospital Area: [facility] [[id number]]  Level of Care: Telemetry [5]  Admit to tele based on following criteria: Monitor for Ischemic changes  Diagnosis: Nausea and vomiting, intractability of vomiting not specified, unspecified vomiting type [[id number]]  Admitting Physician: Rometta Emery [[id number]]  Attending Physician: Rometta Emery [[id number]]  PT Class (Do Not Modify): Observation [104]  PT Acc Code (Do Not Modify): Observation [10022]       Medical History Past Medical History:  Diagnosis Date  . Anxiety   . Bipolar disorder (HCC)   . CANDIDIASIS, ESOPHAGEAL 07/28/2009   Qualifier: Diagnosis of  By: Fabian Sharp MD, Neta Mends   . Chronic kidney disease    CKD III  . COPD (chronic obstructive pulmonary disease) (HCC)   . Depression   . Diabetes mellitus without complication (HCC)    no meds  . FH: colonic polyps   . Fractured elbow    right   . GERD (gastroesophageal reflux disease)   . HH (hiatus hernia)   . History of carpal tunnel syndrome   . History of chest pain   . History of  transfusion of packed red blood cells   . Hyperlipidemia   . Hypertension   . Neuroleptic-induced tardive dyskinesia   . Osteoarthritis of more than one site   . Seasonal allergies     Allergies Allergies  Allergen Reactions  . Prednisone Shortness Of Breath, Itching, Nausea And Vomiting and Palpitations  . Solu-Medrol [Methylprednisolone] Anaphylaxis  . Amoxicillin Hives and Rash    Has patient had a PCN reaction causing immediate rash, facial/tongue/throat swelling, SOB or lightheadedness with hypotension:Yes Has patient had a PCN reaction causing severe rash involving mucus membranes or skin necrosis: NO Has patient had a PCN reaction that required hospitalization NO Has patient had a PCN reaction occurring within the last 10 years: NO If all of the above answers are "NO", then may proceed with Cephalosporin use.   . Codeine Hives, Itching and Rash  . Hydrocodone-Acetaminophen Hives, Itching and Rash  . Penicillins Hives, Itching and Rash    ALLERGIC REACTION TO ORAL AMOXICILLIN Has patient had a PCN reaction causing immediate rash, facial/tongue/throat swelling, SOB or lightheadedness with hypotension: Yes Has patient had a PCN reaction causing severe rash involving mucus membranes or skin necrosis: No Has patient had a PCN reaction that required hospitalization: No Has patient had a PCN reaction occurring within the last 10 years: No If all of the above answers are "NO", then may proceed with Cephalosporin use.  IV Location/Drains/Wounds Patient Lines/Drains/Airways Status   Active Line/Drains/Airways    Name:   Placement date:   Placement time:   Site:   Days:   Peripheral IV 08/06/18 Left Forearm   08/06/18    1631    Forearm   less than 1          Labs/Imaging Results for orders placed or performed during the hospital encounter of 08/06/18 (from the past 48 hour(s))  I-Stat CG4 Lactic Acid, ED     Status: None   Collection Time: 08/06/18  6:30 PM  Result Value  Ref Range   Lactic Acid, Venous 1.48 0.5 - 1.9 mmol/L  Urinalysis, Routine w reflex microscopic     Status: Abnormal   Collection Time: 08/06/18  7:32 PM  Result Value Ref Range   Color, Urine YELLOW YELLOW   APPearance CLEAR CLEAR   Specific Gravity, Urine >1.046 (H) 1.005 - 1.030   pH 6.0 5.0 - 8.0   Glucose, UA NEGATIVE NEGATIVE mg/dL   Hgb urine dipstick NEGATIVE NEGATIVE   Bilirubin Urine NEGATIVE NEGATIVE   Ketones, ur 5 (A) NEGATIVE mg/dL   Protein, ur 048 (A) NEGATIVE mg/dL   Nitrite NEGATIVE NEGATIVE   Leukocytes, UA NEGATIVE NEGATIVE   RBC / HPF 0-5 0 - 5 RBC/hpf   WBC, UA 0-5 0 - 5 WBC/hpf   Bacteria, UA RARE (A) NONE SEEN   Squamous Epithelial / LPF 0-5 0 - 5   Mucus PRESENT    Hyaline Casts, UA PRESENT     Comment: Performed at [facility], [address] [place], [place] [address]  Hepatic function panel     Status: Abnormal   Collection Time: 08/06/18  8:29 PM  Result Value Ref Range   Total Protein 6.3 (L) 6.5 - 8.1 g/dL   Albumin 2.5 (L) 3.5 - 5.0 g/dL   AST 25 15 - 41 U/L   ALT 20 0 - 44 U/L   Alkaline Phosphatase 87 38 - 126 U/L   Total Bilirubin 0.6 0.3 - 1.2 mg/dL   Bilirubin, Direct 0.2 0.0 - 0.2 mg/dL   Indirect Bilirubin 0.4 0.3 - 0.9 mg/dL    Comment: Performed at [facility], [address], [place], [place] [address]  Lipase, blood     Status: None   Collection Time: 08/06/18  8:29 PM  Result Value Ref Range   Lipase 42 11 - 51 U/L    Comment: Performed at [facility], [address], [place], [place] [address]  I-stat troponin, ED     Status: Abnormal   Collection Time: 08/06/18  8:39 PM  Result Value Ref Range   Troponin i, poc 0.15 (HH) 0.00 - 0.08 ng/mL   Comment NOTIFIED PHYSICIAN    Comment 3            Comment: Due to the release kinetics of cTnI, a negative result within the first hours of the onset of symptoms does not rule out myocardial infarction with certainty. If  myocardial infarction is still suspected, repeat the test at appropriate intervals.    Dg Chest 2 View  Result Date: 08/06/2018 CLINICAL DATA:  Mucus in congestion of the chest. Follow-up pneumonia. EXAM: CHEST - 2 VIEW COMPARISON:  07/30/2018 FINDINGS: Clearing of right basilar atelectasis and/or pneumonia since prior. No new pulmonary consolidation. Heart and mediastinal contours are stable with mild aortic atherosclerosis. No aneurysm. Partially included lower cervical spine ACDF hardware is identified. No acute  nor suspicious osseous abnormalities. IMPRESSION: Clearing of right basilar atelectasis and/or pneumonia since prior. No new pulmonary consolidation or CHF. Aortic atherosclerosis (ICD10-I70.0) Electronically Signed   By: Tollie Eth M.D.   On: 08/06/2018 14:32   Ct Abdomen Pelvis W Contrast  Result Date: 08/06/2018 CLINICAL DATA:  Upper abdominal pain with elevated white blood cell count EXAM: CT ABDOMEN AND PELVIS WITH CONTRAST TECHNIQUE: Multidetector CT imaging of the abdomen and pelvis was performed using the standard protocol following bolus administration of intravenous contrast. CONTRAST:  84mL OMNIPAQUE IOHEXOL 300 MG/ML  SOLN COMPARISON:  07/18/2018 FINDINGS: Lower chest: Previously seen right-sided effusion and right lower lobe consolidation has resolved in the interval. No acute abnormality is noted. Hepatobiliary: Mild fatty infiltration of the liver is noted. The gallbladder is well distended without focal abnormality. Pancreas: Unremarkable. No pancreatic ductal dilatation or surrounding inflammatory changes. Spleen: Normal in size without focal abnormality. Adrenals/Urinary Tract: Adrenal glands are within normal limits. The kidneys demonstrate a normal enhancement pattern without evidence of obstructive change. Tiny nonobstructing left renal stone is again noted and stable. The bladder is partially distended. Stomach/Bowel: The appendix is within normal limits. No obstructive  or inflammatory changes of the bowel are seen. Vascular/Lymphatic: Aortic atherosclerosis. No enlarged abdominal or pelvic lymph nodes. Reproductive: Some uterine calcifications are noted likely related to mild fibroid change. Other: No abdominal wall hernia or abnormality. No abdominopelvic ascites. Musculoskeletal: Degenerative changes and postoperative changes of lumbar spine are seen and stable. Mild anterolisthesis of L5 on S1 is again noted and stable. IMPRESSION: Resolution of previously seen right basilar changes. Stable nonobstructing left renal stone No acute abnormality is identified to correspond with the patient's given clinical history. Electronically Signed   By: Alcide Clever M.D.   On: 08/06/2018 18:32   EKG Interpretation  Date/Time:  Tuesday August 06 2018 20:35:19 EDT Ventricular Rate:  98 PR Interval:    QRS Duration: 83 QT Interval:  337 QTC Calculation: 431 R Axis:     Text Interpretation:  ? worsening ST changes inferiorly Otherwise no significant change Confirmed by Melene Plan [phone]) on 08/06/2018 8:53:56 PM   Pending Labs Unresulted Labs (From admission, onward)   None      Vitals/Pain Today's Vitals   08/06/18 2053 08/06/18 2100 08/06/18 2130 08/06/18 2200  BP: (!) 151/80 (!) 141/77 (!) 134/53 (!) 128/93  Pulse: 90 94 91 80  Resp: (!) 23 (!) 24 19 (!) 23  Temp:      TempSrc:      SpO2: 95% 95% 94% 96%  Weight:      Height:      PainSc:        Isolation Precautions No active isolations  Medications Medications  ondansetron (ZOFRAN) injection 4 mg (4 mg Intravenous Refused 08/06/18 1633)  morphine 2 MG/ML injection 2 mg (2 mg Intravenous Refused 08/06/18 1633)  sodium chloride 0.9 % injection (has no administration in time range)  lactated ringers infusion ( Intravenous New Bag/Given 08/06/18 2152)  sodium chloride 0.9 % bolus 1,000 mL (0 mLs Intravenous Stopped 08/06/18 1707)  iohexol (OMNIPAQUE) 300 MG/ML solution 75 mL (75 mLs Intravenous  Contrast Given 08/06/18 1751)  alum & mag hydroxide-simeth (MAALOX/MYLANTA) 200-200-20 MG/5ML suspension 15 mL (15 mLs Oral Given 08/06/18 2036)  sodium chloride 0.9 % bolus 1,000 mL (0 mLs Intravenous Stopped 08/06/18 2150)  aspirin chewable tablet 324 mg (324 mg Oral Given 08/06/18 2150)    Mobility walks with device

## 2018-08-06 NOTE — ED Notes (Signed)
Gave report to Junious Dresser, [employer] fundraiser for room [phone].

## 2018-08-06 NOTE — Assessment & Plan Note (Addendum)
WBC 44 and Eos absolute 33 on 10/8  Started on Decadron 4mg  BID x 5 days  Needs hematology c/s  Labs today showed improvement in leukocytosis

## 2018-08-06 NOTE — Progress Notes (Signed)
Gave report to [employer].   Reeves Forth, RN 08/06/18 11:01 PM

## 2018-08-06 NOTE — Progress Notes (Signed)
Rx Brief note: Lovenox  Rx adjusted Lovenox to 30 mg daily in pt with CrCl < 30 ml/min  Thanks Lorenza Evangelist 08/06/2018 11:22 PM

## 2018-08-07 ENCOUNTER — Inpatient Hospital Stay: Payer: Medicare HMO | Attending: Hematology and Oncology | Admitting: Hematology and Oncology

## 2018-08-07 ENCOUNTER — Other Ambulatory Visit: Payer: Self-pay

## 2018-08-07 DIAGNOSIS — K210 Gastro-esophageal reflux disease with esophagitis: Secondary | ICD-10-CM

## 2018-08-07 DIAGNOSIS — D72829 Elevated white blood cell count, unspecified: Secondary | ICD-10-CM | POA: Diagnosis not present

## 2018-08-07 DIAGNOSIS — R0789 Other chest pain: Secondary | ICD-10-CM

## 2018-08-07 DIAGNOSIS — N183 Chronic kidney disease, stage 3 (moderate): Secondary | ICD-10-CM | POA: Insufficient documentation

## 2018-08-07 DIAGNOSIS — R7989 Other specified abnormal findings of blood chemistry: Secondary | ICD-10-CM | POA: Diagnosis not present

## 2018-08-07 DIAGNOSIS — K2900 Acute gastritis without bleeding: Secondary | ICD-10-CM | POA: Diagnosis not present

## 2018-08-07 DIAGNOSIS — D721 Eosinophilia: Secondary | ICD-10-CM | POA: Insufficient documentation

## 2018-08-07 DIAGNOSIS — I951 Orthostatic hypotension: Secondary | ICD-10-CM | POA: Diagnosis not present

## 2018-08-07 DIAGNOSIS — R112 Nausea with vomiting, unspecified: Secondary | ICD-10-CM | POA: Diagnosis not present

## 2018-08-07 DIAGNOSIS — E1122 Type 2 diabetes mellitus with diabetic chronic kidney disease: Secondary | ICD-10-CM | POA: Insufficient documentation

## 2018-08-07 LAB — CBC
HCT: 32.3 % — ABNORMAL LOW (ref 36.0–46.0)
HCT: 32.4 % — ABNORMAL LOW (ref 36.0–46.0)
Hemoglobin: 10.2 g/dL — ABNORMAL LOW (ref 12.0–15.0)
Hemoglobin: 10.2 g/dL — ABNORMAL LOW (ref 12.0–15.0)
MCH: 29.1 pg (ref 26.0–34.0)
MCH: 29.6 pg (ref 26.0–34.0)
MCHC: 31.5 g/dL (ref 30.0–36.0)
MCHC: 31.6 g/dL (ref 30.0–36.0)
MCV: 92.6 fL (ref 80.0–100.0)
MCV: 93.6 fL (ref 80.0–100.0)
Platelets: 379 10*3/uL (ref 150–400)
Platelets: 409 10*3/uL — ABNORMAL HIGH (ref 150–400)
RBC: 3.45 MIL/uL — ABNORMAL LOW (ref 3.87–5.11)
RBC: 3.5 MIL/uL — ABNORMAL LOW (ref 3.87–5.11)
RDW: 15.4 % (ref 11.5–15.5)
RDW: 15.5 % (ref 11.5–15.5)
WBC: 23.9 10*3/uL — ABNORMAL HIGH (ref 4.0–10.5)
WBC: 28 10*3/uL — ABNORMAL HIGH (ref 4.0–10.5)
nRBC: 0 % (ref 0.0–0.2)
nRBC: 0 % (ref 0.0–0.2)

## 2018-08-07 LAB — COMPREHENSIVE METABOLIC PANEL
ALT: 23 U/L (ref 0–44)
AST: 32 U/L (ref 15–41)
Albumin: 2.3 g/dL — ABNORMAL LOW (ref 3.5–5.0)
Alkaline Phosphatase: 81 U/L (ref 38–126)
Anion gap: 8 (ref 5–15)
BUN: 24 mg/dL — ABNORMAL HIGH (ref 8–23)
CO2: 23 mmol/L (ref 22–32)
Calcium: 7.8 mg/dL — ABNORMAL LOW (ref 8.9–10.3)
Chloride: 110 mmol/L (ref 98–111)
Creatinine, Ser: 1.39 mg/dL — ABNORMAL HIGH (ref 0.44–1.00)
GFR calc Af Amer: 45 mL/min — ABNORMAL LOW (ref >60)
GFR calc non Af Amer: 39 mL/min — ABNORMAL LOW (ref >60)
Glucose, Bld: 127 mg/dL — ABNORMAL HIGH (ref 70–99)
Potassium: 3.5 mmol/L (ref 3.5–5.1)
Sodium: 141 mmol/L (ref 135–145)
Total Bilirubin: 0.4 mg/dL (ref 0.3–1.2)
Total Protein: 5.7 g/dL — ABNORMAL LOW (ref 6.5–8.1)

## 2018-08-07 LAB — CREATININE, SERUM
Creatinine, Ser: 1.44 mg/dL — ABNORMAL HIGH (ref 0.44–1.00)
GFR calc Af Amer: 43 mL/min — ABNORMAL LOW (ref >60)
GFR calc non Af Amer: 37 mL/min — ABNORMAL LOW (ref >60)

## 2018-08-07 LAB — GLUCOSE, CAPILLARY
Glucose-Capillary: 127 mg/dL — ABNORMAL HIGH (ref 70–99)
Glucose-Capillary: 120 mg/dL — ABNORMAL HIGH (ref 70–99)
Glucose-Capillary: 154 mg/dL — ABNORMAL HIGH (ref 70–99)
Glucose-Capillary: 134 mg/dL — ABNORMAL HIGH (ref 70–99)

## 2018-08-07 LAB — TROPONIN I
Troponin I: 0.13 ng/mL (ref <0.03)
Troponin I: 0.11 ng/mL (ref <0.03)
Troponin I: 0.1 ng/mL (ref <0.03)

## 2018-08-07 LAB — TSH: TSH: 3.398 u[IU]/mL (ref 0.350–4.500)

## 2018-08-07 MED ORDER — ENOXAPARIN SODIUM 40 MG/0.4ML ~~LOC~~ SOLN
40.0000 mg | Freq: Every day | SUBCUTANEOUS | Status: DC
  Administered 2018-08-07: 40 mg via SUBCUTANEOUS
  Filled 2018-08-07: qty 0.4

## 2018-08-07 NOTE — Progress Notes (Signed)
Kelly Tibbitts, from the Lab called with a critical Troponin level of 0.13. On call Hospitalist notified of critical Troponin 0.13.  Reeves Forth, RN 08/07/18 01:27AM

## 2018-08-07 NOTE — Plan of Care (Signed)
  Problem: Education: Goal: Knowledge of General Education information will improve Description Including pain rating scale, medication(s)/side effects and non-pharmacologic comfort measures Outcome: Progressing   Problem: Health Behavior/Discharge Planning: Goal: Ability to manage health-related needs will improve Outcome: Progressing   Problem: Clinical Measurements: Goal: Ability to maintain clinical measurements within normal limits will improve Outcome: Progressing Goal: Diagnostic test results will improve Outcome: Progressing Goal: Respiratory complications will improve Outcome: Progressing Note:  Breathing tx.  Goal: Cardiovascular complication will be avoided Outcome: Progressing   Problem: Activity: Goal: Risk for activity intolerance will decrease Outcome: Progressing   Problem: Coping: Goal: Level of anxiety will decrease Outcome: Progressing   Problem: Pain Managment: Goal: General experience of comfort will improve Outcome: Progressing   Problem: Safety: Goal: Ability to remain free from injury will improve Outcome: Progressing

## 2018-08-07 NOTE — Progress Notes (Signed)
Reviewed, agree 

## 2018-08-07 NOTE — Progress Notes (Signed)
PROGRESS NOTE                                                                                                                                                                                                             Patient Demographics:    Desiree [name], is a 67 y.o. female, DOB - [birth year], ZRA:[id number]  Admit date - 08/06/2018   Admitting Physician Rometta Emery, MD  Outpatient Primary MD for the patient is [name], Neta Mends, MD  LOS - 0  Outpatient Specialists: Dr. Eden Emms (cardiology)  Chief Complaint  Patient presents with  . 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  She was found to be hypertensive with blood pressure of 80/40 and heart rate of 130.  Patient sent to the ED.  Blood work showed acute on chronic kidney injury.  EKG showed ST flattening in inferior lead and elevated troponin of 0.15.  Patient reported epigastric discomfort in the setting of active vomiting. In the ED she had a temperature 99.5 F, blood pressure of 78/50 mmHg improved with fluid bolus and was tachycardic to 130s.  Noted BC of 17 point 9K (chronically elevated due to steroid), BUN of 34 and creatinine 1.73 (baseline 1.3).  Chest x-ray was unremarkable while CT of the abdomen and pelvis was negative for acute findings except for stable nonobstructing left renal stone. Patient placed on telemetry for further management.   Subjective:   Patient reports feeling well this morning.  Has mild epigastric discomfort and some chest discomfort with movement which she  reports to be chronic.   Assessment  & Plan :    Principal Problem:   Nausea and vomiting Suspect acute gastroenteritis/gastritis.  She had EGD 3 weeks back which showed gastritis and duodenitis with biopsy negative for malignancy.  Currently resolved.  Monitor with gentle hydration.  Supportive care with antiemetics.  Continue PPI and Carafate..  Patient reports she has not been taking NSAIDs.  Active Problems:  Systemic inflammatory response syndrome (SIRS) Patient hypertensive and tachycardic  on presentation.  No clear signs of infection.  UA and chest x-ray unremarkable.  I have ordered blood culture.  Responded to IV fluids.  Has leukocytosis for past few weeks.  Decadron discontinued.  Recurrent chest pain Atypical and reproducible.  She does have ST flattening on inferior leads with mildly elevated troponin which is trending down (peaked at 0.15).  No active chest pain at present. Recent 2D echo (done within 2 weeks for HOCM) showed LVH with EF of 60-65% and no wall motion abnormality and grade 1 diastolic dysfunction. Stable on telemetry. Continue aspirin and statin.  Not on beta-blocker.   Essential hypertension  blood pressure improved with IV fluids.  Hold losartan.    Diabetes mellitus with renal manifestations, controlled (HCC) CBG stable.  Monitor on sliding scale coverage.  COPD/bronchiectasis. Stable.  Continue home inhaler.  Pleural disorder/anxiety Continue Cymbalta and Lamictal.  Eosinophilia Outpatient follow-up with hematology.  (Was scheduled for today which she missed.  We will have to reschedule)  Leukocytosis Noted to be elevated the last few weeks.  Was placed on Decadron for 5 days on 10/8 by pulmonary for concern of eosinophilic pneumonia.  Will discontinue.  Follow cultures.  Code Status : Full code  Family Communication  : None at bedside  Disposition Plan  : Home if cultures negative and stable over the next 24 hours  Barriers For Discharge :  Active symptoms  Consults  : None  Procedures  : None  DVT Prophylaxis  : Subcu Lovenox  Lab Results  Component Value Date   PLT 379 08/07/2018    Antibiotics  :    Anti-infectives (From admission, onward)   None        Objective:   Vitals:   08/07/18 0203 08/07/18 0523 08/07/18 0752 08/07/18 0926  BP:  117/61  131/68  Pulse:  79  70  Resp:  18  16  Temp:  99.2 F (37.3 C)    TempSrc:  Oral    SpO2:  93% 96% 98%  Weight: 60.3 kg     Height: 5\' 3"  (1.6 m)       Wt Readings from Last 3 Encounters:  08/07/18 60.3 kg  08/06/18 60.7 kg  08/01/18 64.4 kg     Intake/Output Summary (Last 24 hours) at 08/07/2018 1434 Last data filed at 08/07/2018 1345 Gross per 24 hour  Intake 3008.07 ml  Output 350 ml  Net 2658.07 ml     Physical Exam  Gen: not in distress HEENT:  moist mucosa, supple neck Chest: clear b/l, no added sounds, reproducible pain on pressure over the substernal area CVS: S1-S2 regular, no murmurs rub or gallop GI: soft, NT, ND, BS+ Musculoskeletal: warm, no edema     Data Review:    CBC Recent Labs  Lab 08/06/18 1438 08/06/18 2336 08/07/18 0530  WBC 17.9* 23.9* 28.0*  HGB 12.4 10.2* 10.2*  HCT 38.9 32.4* 32.3*  PLT 504.0* 409* 379  MCV 90.3 92.6 93.6  MCH  --  29.1 29.6  MCHC 31.8 31.5 31.6  RDW 16.6* 15.5 15.4  LYMPHSABS 2.0  --   --   MONOABS 1.0  --   --   EOSABS 5.5*  --   --   BASOSABS 0.2*  --   --     Chemistries  Recent Labs  Lab 08/06/18 1438 08/06/18 2029 08/06/18 2336 08/07/18 0530  NA 139  --   --  141  K 4.1  --   --  3.5  CL 101  --   --  110  CO2 24  --   --  23  GLUCOSE 197*  --   --  127*  BUN 34*  --   --  24*  CREATININE 1.73*  --  1.44* 1.39*  CALCIUM 9.1  --   --  7.8*  AST  --  25  --  32  ALT  --  20  --  23  ALKPHOS  --  87  --  81  BILITOT  --  0.6  --  0.4   ------------------------------------------------------------------------------------------------------------------ No results  for input(s): CHOL, HDL, LDLCALC, TRIG, CHOLHDL, LDLDIRECT in the last 72 hours.  Lab Results  Component Value Date   HGBA1C 5.9 12/26/2017   ------------------------------------------------------------------------------------------------------------------ Recent Labs    08/06/18 2336  TSH 3.398   ------------------------------------------------------------------------------------------------------------------ No results for input(s): VITAMINB12, FOLATE, FERRITIN, TIBC, IRON, RETICCTPCT in the last 72 hours.  Coagulation profile No results for input(s): INR, PROTIME in the last 168 hours.  No results for input(s): DDIMER in the last 72 hours.  Cardiac Enzymes Recent Labs  Lab 08/06/18 2336 08/07/18 0530 08/07/18 1142  TROPONINI 0.13* 0.11* 0.10*   ------------------------------------------------------------------------------------------------------------------ No results found for: BNP  Inpatient Medications  Scheduled Meds: . acidophilus  1 capsule Oral Q breakfast  . atorvastatin  20 mg Oral q1800  . dexamethasone  4 mg Oral BID WC  . diclofenac sodium  1 application Topical TID  . docusate sodium  100 mg Oral QHS  . DULoxetine  60 mg Oral Daily  . enoxaparin (LOVENOX) injection  40 mg Subcutaneous QHS  . feeding supplement (ENSURE ENLIVE)  237 mL Oral TID BM  . fluticasone  1-2 spray Each Nare QHS  . guaiFENesin  600 mg Oral QHS  . insulin aspart  0-5 Units Subcutaneous QHS  . insulin aspart  0-9 Units Subcutaneous TID WC  . ipratropium-albuterol  3 mL Nebulization TID  . lamoTRIgine  25 mg Oral BID  . linaclotide  145 mcg Oral QAC breakfast  . loratadine  10 mg Oral Daily  . losartan  50 mg Oral Daily  . montelukast  10 mg Oral QHS  .  morphine injection  2 mg Intravenous Once  . ondansetron (ZOFRAN) IV  4 mg Intravenous Once  . pantoprazole  40 mg Oral Daily  . sucralfate  1 g Oral BID  . umeclidinium-vilanterol  1 puff Inhalation Daily   Continuous  Infusions: . sodium chloride 75 mL/hr at 08/07/18 1345  . lactated ringers 100 mL/hr at 08/06/18 2240   PRN Meds:.acetaminophen, ondansetron **OR** ondansetron (ZOFRAN) IV, ondansetron  Micro Results No results found for this or any previous visit (from the past 240 hour(s)).  Radiology Reports Ct Abdomen Pelvis Wo Contrast  Result Date: 07/18/2018 CLINICAL DATA:  Upper abdominal epigastric pain, vomiting, history of stage III chronic kidney disease, COPD, diabetes mellitus, hypertension, former smoker EXAM: CT ABDOMEN AND PELVIS WITHOUT CONTRAST TECHNIQUE: Multidetector CT imaging of the abdomen and pelvis was performed following the standard protocol without IV contrast. Sagittal and coronal MPR images reconstructed from axial data set. Patient drank dilute oral contrast for exam. COMPARISON:  01/03/2017 FINDINGS: Lower chest: Small RIGHT pleural effusion. Consolidation RIGHT lower lobe. Minimal LEFT basilar atelectasis. Hepatobiliary: Gallbladder and liver normal appearance Pancreas: Normal appearance Spleen: Normal appearance Adrenals/Urinary Tract: Adrenal glands unremarkable. Nonobstructing calculus mid inferior LEFT kidney. No definite renal mass or hydronephrosis. No ureteral calcification or dilatation. Bladder unremarkable. Stomach/Bowel: Normal  appendix. Stomach and bowel loops normal appearance Vascular/Lymphatic: Atherosclerotic calcifications aorta and iliac arteries without aneurysm. Scattered normal size retroperitoneal lymph nodes. No definite abdominal or pelvic adenopathy. Reproductive: Unremarkable uterus and ovaries Other: No free air or free fluid. Minimal edema within tissue planes in the pelvis versus previous exam though no specific/localized acute inflammatory process is identified. Musculoskeletal: Osseous demineralization. Lumbar fusion L4-L5 with minimal anterolisthesis. Facet degenerative changes lower lumbar spine. No acute bony findings. IMPRESSION: Small RIGHT pleural  effusion with mild consolidation in RIGHT lower lobe. Minimal edema of soft tissue planes in pelvis though no specific acute inflammatory processes localized. Small nonobstructing LEFT renal calculus. Remainder of exam unremarkable. Electronically Signed   By: Ulyses Southward M.D.   On: 07/18/2018 14:21   Dg Chest 2 View  Result Date: 08/06/2018 CLINICAL DATA:  Mucus in congestion of the chest. Follow-up pneumonia. EXAM: CHEST - 2 VIEW COMPARISON:  07/30/2018 FINDINGS: Clearing of right basilar atelectasis and/or pneumonia since prior. No new pulmonary consolidation. Heart and mediastinal contours are stable with mild aortic atherosclerosis. No aneurysm. Partially included lower cervical spine ACDF hardware is identified. No acute nor suspicious osseous abnormalities. IMPRESSION: Clearing of right basilar atelectasis and/or pneumonia since prior. No new pulmonary consolidation or CHF. Aortic atherosclerosis (ICD10-I70.0) Electronically Signed   By: Tollie Eth M.D.   On: 08/06/2018 14:32   Dg Chest 2 View  Result Date: 07/30/2018 CLINICAL DATA:  Follow-up pneumonia EXAM: CHEST - 2 VIEW COMPARISON:  07/16/2018 FINDINGS: Cardiac shadow is stable. Persistent bibasilar infiltrate right greater than left is noted. No sizable effusion is seen. Degenerative changes of the thoracic spine are noted. No other focal abnormality is seen. IMPRESSION: Persistent but slightly improved bibasilar infiltrates particularly on the right. Electronically Signed   By: Alcide Clever M.D.   On: 07/30/2018 12:54   Dg Chest 2 View  Result Date: 07/16/2018 CLINICAL DATA:  Productive cough and shortness of breath with midline chest pain. Former smoker. Currently diagnosed with right lower lobe pneumonia. EXAM: CHEST - 2 VIEW COMPARISON:  Portable chest x-ray of July 14, 2018 FINDINGS: There remains mild volume loss on the right. There is a small right pleural effusion. The left lung is well-expanded. The interstitial markings of  both lungs are coarse. The heart and pulmonary vascularity are normal. The mediastinum is normal in width. The bony thorax exhibits no acute abnormality. IMPRESSION: Persistent right lower lobe pneumonia. Small right pleural effusion. Underlying COPD. Electronically Signed   By: David  [place] M.D.   On: 07/16/2018 10:17   Dg Chest 2 View  Result Date: 07/11/2018 CLINICAL DATA:  Nausea vomiting and headache x1 week EXAM: CHEST - 2 VIEW COMPARISON:  08/22/2017 FINDINGS: Heart size is normal. There is mild aortic atherosclerosis without aneurysm. No acute pulmonary consolidation. Mild chronic interstitial prominence and bibasilar atelectasis is identified. No overt pulmonary edema. ACDF of the lower cervical spine. No acute osseous abnormality. IMPRESSION: 1. Chronic mild interstitial lung disease and bibasilar atelectasis. 2. Minimal aortic atherosclerosis. 3. No acute pulmonary disease Electronically Signed   By: Tollie Eth M.D.   On: 07/11/2018 18:08   Ct Chest Wo Contrast  Result Date: 07/11/2018 CLINICAL DATA:  Nausea and vomiting for 1 week. Recurrent chest pain for 6 months. Shortness of breath and dizziness with exertion. Productive cough. EXAM: CT CHEST WITHOUT CONTRAST TECHNIQUE: Multidetector CT imaging of the chest was performed following the standard protocol without IV contrast. COMPARISON:  High-resolution chest CT 09/26/2017 FINDINGS: Cardiovascular: Normal heart size.  Minimal pericardial effusion. Normal caliber thoracic aorta with scattered calcifications. Mediastinum/Nodes: Mild prominence of mediastinal lymph nodes with pretracheal nodes measuring up to about 10 mm short axis dimension. Similar appearance to previous study. Likely to be reactive. Esophagus is decompressed. Thyroid gland is unremarkable. Lungs/Pleura: Scattered areas of interstitial fibrosis in the lungs. Bronchial wall thickening suggesting chronic bronchitis. Bronchiectasis with mucous plugging in the lung bases. Patchy  focal areas of consolidation in the lung bases, more prominent on the right. Consolidative changes are new since previous study suggesting interval development of an acute bronchopneumonia. No pleural effusions. No pneumothorax. Upper Abdomen: No acute changes identified. Vascular calcifications. Musculoskeletal: Degenerative changes in the thoracic spine. No destructive bone lesions. IMPRESSION: 1. Patchy areas of consolidation in the lung bases, more prominent on the right, suggesting interval development of acute bronchopneumonia. 2. Chronic bronchitic changes with bronchiectasis and mucous plugging in the lung bases. 3. Mild mediastinal lymphadenopathy, likely reactive. 4. Minimal pericardial effusion. Aortic Atherosclerosis (ICD10-I70.0). Electronically Signed   By: Burman Nieves M.D.   On: 07/11/2018 23:55   Ct Abdomen Pelvis W Contrast  Result Date: 08/06/2018 CLINICAL DATA:  Upper abdominal pain with elevated white blood cell count EXAM: CT ABDOMEN AND PELVIS WITH CONTRAST TECHNIQUE: Multidetector CT imaging of the abdomen and pelvis was performed using the standard protocol following bolus administration of intravenous contrast. CONTRAST:  65mL OMNIPAQUE IOHEXOL 300 MG/ML  SOLN COMPARISON:  07/18/2018 FINDINGS: Lower chest: Previously seen right-sided effusion and right lower lobe consolidation has resolved in the interval. No acute abnormality is noted. Hepatobiliary: Mild fatty infiltration of the liver is noted. The gallbladder is well distended without focal abnormality. Pancreas: Unremarkable. No pancreatic ductal dilatation or surrounding inflammatory changes. Spleen: Normal in size without focal abnormality. Adrenals/Urinary Tract: Adrenal glands are within normal limits. The kidneys demonstrate a normal enhancement pattern without evidence of obstructive change. Tiny nonobstructing left renal stone is again noted and stable. The bladder is partially distended. Stomach/Bowel: The appendix is  within normal limits. No obstructive or inflammatory changes of the bowel are seen. Vascular/Lymphatic: Aortic atherosclerosis. No enlarged abdominal or pelvic lymph nodes. Reproductive: Some uterine calcifications are noted likely related to mild fibroid change. Other: No abdominal wall hernia or abnormality. No abdominopelvic ascites. Musculoskeletal: Degenerative changes and postoperative changes of lumbar spine are seen and stable. Mild anterolisthesis of L5 on S1 is again noted and stable. IMPRESSION: Resolution of previously seen right basilar changes. Stable nonobstructing left renal stone No acute abnormality is identified to correspond with the patient's given clinical history. Electronically Signed   By: Alcide Clever M.D.   On: 08/06/2018 18:32   Dg Chest Port 1v Same Day  Result Date: 07/14/2018 CLINICAL DATA:  Shortness of breath EXAM: PORTABLE CHEST 1 VIEW COMPARISON:  CT chest dated 07/11/2018 FINDINGS: Patchy right lower lobe opacity, suspicious for pneumonia. Associated trace right pleural effusion. Bilateral lower lobe bronchiectasis. No pneumothorax. The heart is normal in size. Cervical spine fixation hardware. IMPRESSION: Patchy right lower lobe opacity, suspicious for pneumonia. Associated trace right pleural effusion. Electronically Signed   By: Charline Bills M.D.   On: 07/14/2018 08:20    Time Spent in minutes  25   Vernon Ariel M.D on 08/07/2018 at 2:34 PM  Between 7am to 7pm - Pager - [phone]  After 7pm go to [email or web address] - password [facility]  [facility] -  Office  [phone]

## 2018-08-07 NOTE — Plan of Care (Signed)
  Problem: Health Behavior/Discharge Planning: Goal: Ability to manage health-related needs will improve Outcome: Progressing   Problem: Clinical Measurements: Goal: Will remain free from infection Outcome: Progressing Goal: Cardiovascular complication will be avoided Outcome: Progressing   Problem: Coping: Goal: Level of anxiety will decrease Outcome: Progressing   Reeves Forth, RN 08/07/18 4:24 AM

## 2018-08-08 ENCOUNTER — Other Ambulatory Visit: Payer: Self-pay | Admitting: Pharmacy Technician

## 2018-08-08 ENCOUNTER — Ambulatory Visit: Payer: Medicare HMO | Admitting: Primary Care

## 2018-08-08 DIAGNOSIS — I951 Orthostatic hypotension: Secondary | ICD-10-CM

## 2018-08-08 DIAGNOSIS — R778 Other specified abnormalities of plasma proteins: Secondary | ICD-10-CM | POA: Diagnosis present

## 2018-08-08 DIAGNOSIS — K2900 Acute gastritis without bleeding: Secondary | ICD-10-CM | POA: Diagnosis not present

## 2018-08-08 DIAGNOSIS — K210 Gastro-esophageal reflux disease with esophagitis: Secondary | ICD-10-CM | POA: Diagnosis not present

## 2018-08-08 DIAGNOSIS — D72829 Elevated white blood cell count, unspecified: Secondary | ICD-10-CM | POA: Diagnosis present

## 2018-08-08 DIAGNOSIS — R112 Nausea with vomiting, unspecified: Secondary | ICD-10-CM | POA: Diagnosis not present

## 2018-08-08 DIAGNOSIS — R7989 Other specified abnormal findings of blood chemistry: Secondary | ICD-10-CM

## 2018-08-08 DIAGNOSIS — R0789 Other chest pain: Secondary | ICD-10-CM | POA: Diagnosis not present

## 2018-08-08 DIAGNOSIS — I959 Hypotension, unspecified: Secondary | ICD-10-CM | POA: Diagnosis present

## 2018-08-08 HISTORY — DX: Acute gastritis without bleeding: K29.00

## 2018-08-08 LAB — CBC
HCT: 30.3 % — ABNORMAL LOW (ref 36.0–46.0)
HCT: 31.7 % — ABNORMAL LOW (ref 36.0–46.0)
Hemoglobin: 9.2 g/dL — ABNORMAL LOW (ref 12.0–15.0)
Hemoglobin: 9.5 g/dL — ABNORMAL LOW (ref 12.0–15.0)
MCH: 28.5 pg (ref 26.0–34.0)
MCH: 28.9 pg (ref 26.0–34.0)
MCHC: 30.4 g/dL (ref 30.0–36.0)
MCHC: 30 g/dL (ref 30.0–36.0)
MCV: 93.8 fL (ref 80.0–100.0)
MCV: 96.4 fL (ref 80.0–100.0)
Platelets: 363 10*3/uL (ref 150–400)
Platelets: 394 10*3/uL (ref 150–400)
RBC: 3.23 MIL/uL — ABNORMAL LOW (ref 3.87–5.11)
RBC: 3.29 MIL/uL — ABNORMAL LOW (ref 3.87–5.11)
RDW: 15.4 % (ref 11.5–15.5)
RDW: 15.5 % (ref 11.5–15.5)
WBC: 10.8 10*3/uL — ABNORMAL HIGH (ref 4.0–10.5)
WBC: 17.5 10*3/uL — ABNORMAL HIGH (ref 4.0–10.5)
nRBC: 0 % (ref 0.0–0.2)
nRBC: 0 % (ref 0.0–0.2)

## 2018-08-08 LAB — BASIC METABOLIC PANEL
Anion gap: 5 (ref 5–15)
BUN: 19 mg/dL (ref 8–23)
CO2: 24 mmol/L (ref 22–32)
Calcium: 8.2 mg/dL — ABNORMAL LOW (ref 8.9–10.3)
Chloride: 112 mmol/L — ABNORMAL HIGH (ref 98–111)
Creatinine, Ser: 1.11 mg/dL — ABNORMAL HIGH (ref 0.44–1.00)
GFR calc Af Amer: 59 mL/min — ABNORMAL LOW (ref >60)
GFR calc non Af Amer: 51 mL/min — ABNORMAL LOW (ref >60)
Glucose, Bld: 107 mg/dL — ABNORMAL HIGH (ref 70–99)
Potassium: 4.1 mmol/L (ref 3.5–5.1)
Sodium: 141 mmol/L (ref 135–145)

## 2018-08-08 LAB — GLUCOSE, CAPILLARY
Glucose-Capillary: 96 mg/dL (ref 70–99)
Glucose-Capillary: 83 mg/dL (ref 70–99)

## 2018-08-08 LAB — CK: Total CK: 46 U/L (ref 38–234)

## 2018-08-08 NOTE — Plan of Care (Signed)
  Problem: Education: Goal: Knowledge of General Education information will improve Description Including pain rating scale, medication(s)/side effects and non-pharmacologic comfort measures Outcome: Completed/Met   Problem: Health Behavior/Discharge Planning: Goal: Ability to manage health-related needs will improve Outcome: Completed/Met   Problem: Clinical Measurements: Goal: Ability to maintain clinical measurements within normal limits will improve Outcome: Completed/Met Goal: Diagnostic test results will improve Outcome: Completed/Met Goal: Respiratory complications will improve Outcome: Completed/Met Goal: Cardiovascular complication will be avoided Outcome: Completed/Met   Problem: Activity: Goal: Risk for activity intolerance will decrease Outcome: Completed/Met   Problem: Coping: Goal: Level of anxiety will decrease Outcome: Completed/Met   Problem: Pain Managment: Goal: General experience of comfort will improve Outcome: Completed/Met   Problem: Safety: Goal: Ability to remain free from injury will improve Outcome: Completed/Met

## 2018-08-08 NOTE — Progress Notes (Signed)
D/C instructions reviewed w/ pt. Pt verbalizes understanding and all questions answered. Pt in stable condition, states ride is on the way. Pt in possession of d/c folder and all personal belongings.

## 2018-08-08 NOTE — Discharge Summary (Signed)
Physician Discharge Summary  ERENDIDA WRENN HUT:[id number] DOB: 06/05/1951 DOA: 08/06/2018  PCP: [name], MD  Admit date: 08/06/2018 Discharge date: 08/08/2018  Admitted From: Home Disposition: Home  Recommendations for Outpatient Follow-up:  Follow up with PCP in 1 week.  Please check CBC Follow-up with hematology Dr. Nyoka Cowden in 2 weeks (I have called the office and left a message for appointment)  Home Health: None Equipment/Devices: None  Discharge Condition: Fair CODE STATUS: Full code Diet recommendation: Heart Healthy / Carb Modified     Discharge Diagnoses:  Principal Problem:   Acute gastritis without bleeding  Active Problems: Atypical chest pain   Nausea and vomiting   Leucocytosis   Hypotension Acute kidney injury  Elevated troponin I level   GERD   Diabetes mellitus with renal manifestations, controlled ([place])   Hyperlipidemia   Brief narrative/HPI Please refer to admission H&P for details, in brief,67 year old female with history of COPD, bronchiectasis with recent hospitalization for pneumonia, pulmonary fibrosis, hypertrophic cardiomyopathy, 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  She was found to be hypertensive with blood pressure of 80/40 and heart rate of 130.  Patient sent to the ED.  Blood work showed acute on chronic kidney injury.  EKG showed ST flattening in inferior lead and elevated troponin of 0.15.  Patient reported epigastric discomfort in the setting of active vomiting. In the ED she had a temperature 99.5 F, blood pressure of 78/50 mmHg improved with fluid bolus and was tachycardic to 130s.  Noted BC of 17 point 9K (chronically elevated due to steroid), BUN of 34 and creatinine 1.73 (baseline 1.3).  Chest x-ray was unremarkable while CT  of the abdomen and pelvis was negative for acute findings except for stable nonobstructing left renal stone. Patient placed on telemetry for further management.  Hospital course   Principal Problem:   Nausea and vomiting Suspect acute gastroenteritis/gastritis.  She had EGD done 3 weeks back which showed gastritis and duodenitis with biopsy negative for malignancy.  Currently resolved.    Improved with hydration and supportive care.   Continue PPI and Carafate..  Patient reports she has not been taking NSAIDs.  Active Problems:  Systemic inflammatory response syndrome (SIRS) Patient hypotensive and tachycardic on presentation.  No clear signs of infection.  UA and chest x-ray unremarkable.  Blood cultures negative for growth.  Responded to IV fluids.  Has leukocytosis for past few weeks since he was hospitalized for pneumonia.  Now trending down (17 K today).  Decadron discontinued.  Recurrent chest pain Atypical and reproducible.  She does have ST flattening on inferior leads with mildly elevated troponin which is trending down (peaked at 0.15).  No active chest pain at present. Recent 2D echo (done within 2 weeks for HOCM) showed LVH with EF of 60-65% and no wall motion abnormality and grade 1 diastolic dysfunction. Remained stable on telemetry.  Will check CK. Continue aspirin and statin.  Not on beta-blocker. Patient should follow-up with her cardiologist in 4 to 8 weeks.  Acute kidney injury ([place]) Secondary to dehydration.  Improved with IV fluids.  Losartan held and can be resumed.  Hypotension Secondary to dehydration.  Improved with fluids.  Resumed losartan upon discharge.  GERD/IBS Continue Protonix, Carafate and Linzess.  Recent EGD showing gastritis and duodenitis.  Diabetes mellitus with renal manifestations,  controlled ([place]) CBG stable.    COPD/bronchiectasis. Stable.  Continue home inhaler.  Anxiety/depression Continue Cymbalta and  Lamictal.  Eosinophilia Outpatient follow-up with hematology.  (Was scheduled for 10/16 which she missed since she was in the hospital.    Have left a message to the office to have it rescheduled.)  Leukocytosis Noted to be elevated the last few weeks.  Was placed on Decadron for 5 days on 10/8 by pulmonary for concern of eosinophilic pneumonia.  now discontinued.  WBC improved to 17 K.  Should be followed as outpatient.     Family Communication  : None at bedside  Disposition Plan  : Home     Consults  : None  Procedures  : None  Discharge Instructions   Allergies as of 08/08/2018      Reactions   Prednisone Shortness Of Breath, Itching, Nausea And Vomiting, Palpitations   Solu-medrol [methylprednisolone] Anaphylaxis   Amoxicillin Hives, Rash   Has patient had a PCN reaction causing immediate rash, facial/tongue/throat swelling, SOB or lightheadedness with hypotension:Yes Has patient had a PCN reaction causing severe rash involving mucus membranes or skin necrosis: NO Has patient had a PCN reaction that required hospitalization NO Has patient had a PCN reaction occurring within the last 10 years: NO If all of the above answers are "NO", then may proceed with Cephalosporin use.   Codeine Hives, Itching, Rash   Hydrocodone-acetaminophen Hives, Itching, Rash   Penicillins Hives, Itching, Rash   ALLERGIC REACTION TO ORAL AMOXICILLIN Has patient had a PCN reaction causing immediate rash, facial/tongue/throat swelling, SOB or lightheadedness with hypotension: Yes Has patient had a PCN reaction causing severe rash involving mucus membranes or skin necrosis: No Has patient had a PCN reaction that required hospitalization: No Has patient had a PCN reaction occurring within the last 10 years: No If all of the above answers are "NO", then may proceed with Cephalosporin use.      Medication List    STOP taking these medications   dexamethasone 4 MG tablet Commonly known  as:  DECADRON     TAKE these medications   ACCU-CHEK FASTCLIX LANCET Kit USE TO CHECK BLOOD SUGAR 1-2 TIMES DAILY   ACCU-CHEK NANO SMARTVIEW w/Device Kit Use to check blood sugar 1-2 times daily What changed:    how much to take  how to take this  when to take this  additional instructions   acetaminophen 500 MG tablet Commonly known as:  TYLENOL Take 2 tablets (1,000 mg total) by mouth every 6 (six) hours as needed. What changed:  reasons to take this   ALIGN PO Take 1 capsule by mouth daily with breakfast.   atorvastatin 20 MG tablet Commonly known as:  LIPITOR TAKE 1 TABLET BY MOUTH ONCE DAILY   CENTRUM SILVER PO Take 1 tablet by mouth daily.   cetirizine 10 MG tablet Commonly known as:  ZYRTEC Take 10 mg by mouth at bedtime.   COLACE 100 MG capsule Generic drug:  docusate sodium Take 100 mg by mouth at bedtime.   diclofenac sodium 1 % Gel Commonly known as:  VOLTAREN Apply 3 grams to 3 large joints, up to 3 times daily. What changed:    how much to take  how to take this  when to take this  additional instructions   DULoxetine 60 MG capsule Commonly known as:  CYMBALTA Take 1 capsule (60 mg total) by mouth daily.   feeding supplement (ENSURE ENLIVE) Liqd Take 237 mLs by mouth 3 (  three) times daily between meals.   fluticasone 50 MCG/ACT nasal spray Commonly known as:  FLONASE Place 1-2 sprays into both nostrils at bedtime.   FLUTTER Devi 1 Device by Does not apply route as directed. What changed:  how to take this   glucose blood test strip Accu-chek nano, test glucose once daily, dx E11.9   guaiFENesin 600 MG 12 hr tablet Commonly known as:  MUCINEX Take 600 mg by mouth at bedtime.   ipratropium-albuterol 0.5-2.5 (3) MG/3ML Soln Commonly known as:  DUONEB INHALE CONTENTS OF 1 VIAL THREE TIMES A DAY IN NEBULIZER What changed:  See the new instructions.   lamoTRIgine 25 MG tablet Commonly known as:  LAMICTAL Take 1 tablet (25 mg  total) by mouth 2 (two) times daily.   LINZESS 145 MCG Caps capsule Generic drug:  linaclotide Take 145 mcg by mouth daily before breakfast.   losartan 50 MG tablet Commonly known as:  COZAAR TAKE 1 TABLET BY MOUTH ONCE DAILY   montelukast 10 MG tablet Commonly known as:  SINGULAIR Take 1 tablet (10 mg total) at bedtime by mouth.   NON FORMULARY FDgard: Take 1 capsule by mouth at bedtime   NON FORMULARY Respivest: Three times a day for 30 minutes   ondansetron 4 MG disintegrating tablet Commonly known as:  ZOFRAN-ODT Take 1 tablet (4 mg total) by mouth every 8 (eight) hours as needed for nausea or vomiting.   pantoprazole 40 MG tablet Commonly known as:  PROTONIX Take 40 mg by mouth daily.   sodium chloride HYPERTONIC 3 % nebulizer solution Take by nebulization 2 (two) times daily. What changed:  how much to take   sucralfate 1 GM/10ML suspension Commonly known as:  CARAFATE Take 10 mLs (1 g total) by mouth 2 (two) times daily.   umeclidinium-vilanterol 62.5-25 MCG/INH Aepb Commonly known as:  ANORO ELLIPTA Inhale 1 puff into the lungs daily.      Follow-up Information    [name], [name], MD. Schedule an appointment as soon as possible for a visit in 1 week(s).   Specialties:  Internal Medicine, Pediatrics Contact information: [place] [address] [phone]        Nicholas Lose, MD. Schedule an appointment as soon as possible for a visit in 2 week(s).   Specialty:  Hematology and Oncology Contact information: [place] [id number] [phone]          Allergies  Allergen Reactions  . Prednisone Shortness Of Breath, Itching, Nausea And Vomiting and Palpitations  . Solu-Medrol [Methylprednisolone] Anaphylaxis  . Amoxicillin Hives and Rash    Has patient had a PCN reaction causing immediate rash, facial/tongue/throat swelling, SOB or lightheadedness with hypotension:Yes Has patient had a PCN  reaction causing severe rash involving mucus membranes or skin necrosis: NO Has patient had a PCN reaction that required hospitalization NO Has patient had a PCN reaction occurring within the last 10 years: NO If all of the above answers are "NO", then may proceed with Cephalosporin use.   . Codeine Hives, Itching and Rash  . Hydrocodone-Acetaminophen Hives, Itching and Rash  . Penicillins Hives, Itching and Rash    ALLERGIC REACTION TO ORAL AMOXICILLIN Has patient had a PCN reaction causing immediate rash, facial/tongue/throat swelling, SOB or lightheadedness with hypotension: Yes Has patient had a PCN reaction causing severe rash involving mucus membranes or skin necrosis: No Has patient had a PCN reaction that required hospitalization: No Has patient had a PCN reaction occurring  within the last 10 years: No If all of the above answers are "NO", then may proceed with Cephalosporin use.        Procedures/Studies: Ct Abdomen Pelvis Wo Contrast  Result Date: 07/18/2018 CLINICAL DATA:  Upper abdominal epigastric pain, vomiting, history of stage III chronic kidney disease, COPD, diabetes mellitus, hypertension, former smoker EXAM: CT ABDOMEN AND PELVIS WITHOUT CONTRAST TECHNIQUE: Multidetector CT imaging of the abdomen and pelvis was performed following the standard protocol without IV contrast. Sagittal and coronal MPR images reconstructed from axial data set. Patient drank dilute oral contrast for exam. COMPARISON:  01/03/2017 FINDINGS: Lower chest: Small RIGHT pleural effusion. Consolidation RIGHT lower lobe. Minimal LEFT basilar atelectasis. Hepatobiliary: Gallbladder and liver normal appearance Pancreas: Normal appearance Spleen: Normal appearance Adrenals/Urinary Tract: Adrenal glands unremarkable. Nonobstructing calculus mid inferior LEFT kidney. No definite renal mass or hydronephrosis. No ureteral calcification or dilatation. Bladder unremarkable. Stomach/Bowel: Normal appendix. Stomach  and bowel loops normal appearance Vascular/Lymphatic: Atherosclerotic calcifications aorta and iliac arteries without aneurysm. Scattered normal size retroperitoneal lymph nodes. No definite abdominal or pelvic adenopathy. Reproductive: Unremarkable uterus and ovaries Other: No free air or free fluid. Minimal edema within tissue planes in the pelvis versus previous exam though no specific/localized acute inflammatory process is identified. Musculoskeletal: Osseous demineralization. Lumbar fusion L4-L5 with minimal anterolisthesis. Facet degenerative changes lower lumbar spine. No acute bony findings. IMPRESSION: Small RIGHT pleural effusion with mild consolidation in RIGHT lower lobe. Minimal edema of soft tissue planes in pelvis though no specific acute inflammatory processes localized. Small nonobstructing LEFT renal calculus. Remainder of exam unremarkable. Electronically Signed   By: Lavonia Dana M.D.   On: 07/18/2018 14:21   Dg Chest 2 View  Result Date: 08/06/2018 CLINICAL DATA:  Mucus in congestion of the chest. Follow-up pneumonia. EXAM: CHEST - 2 VIEW COMPARISON:  07/30/2018 FINDINGS: Clearing of right basilar atelectasis and/or pneumonia since prior. No new pulmonary consolidation. Heart and mediastinal contours are stable with mild aortic atherosclerosis. No aneurysm. Partially included lower cervical spine ACDF hardware is identified. No acute nor suspicious osseous abnormalities. IMPRESSION: Clearing of right basilar atelectasis and/or pneumonia since prior. No new pulmonary consolidation or CHF. Aortic atherosclerosis (ICD10-I70.0) Electronically Signed   By: Ashley Royalty M.D.   On: 08/06/2018 14:32   Dg Chest 2 View  Result Date: 07/30/2018 CLINICAL DATA:  Follow-up pneumonia EXAM: CHEST - 2 VIEW COMPARISON:  07/16/2018 FINDINGS: Cardiac shadow is stable. Persistent bibasilar infiltrate right greater than left is noted. No sizable effusion is seen. Degenerative changes of the thoracic spine are  noted. No other focal abnormality is seen. IMPRESSION: Persistent but slightly improved bibasilar infiltrates particularly on the right. Electronically Signed   By: Inez Catalina M.D.   On: 07/30/2018 12:54   Dg Chest 2 View  Result Date: 07/16/2018 CLINICAL DATA:  Productive cough and shortness of breath with midline chest pain. Former smoker. Currently diagnosed with right lower lobe pneumonia. EXAM: CHEST - 2 VIEW COMPARISON:  Portable chest x-ray of July 14, 2018 FINDINGS: There remains mild volume loss on the right. There is a small right pleural effusion. The left lung is well-expanded. The interstitial markings of both lungs are coarse. The heart and pulmonary vascularity are normal. The mediastinum is normal in width. The bony thorax exhibits no acute abnormality. IMPRESSION: Persistent right lower lobe pneumonia. Small right pleural effusion. Underlying COPD. Electronically Signed   By: David  [place] M.D.   On: 07/16/2018 10:17   Dg Chest 2 View  Result Date:  07/11/2018 CLINICAL DATA:  Nausea vomiting and headache x1 week EXAM: CHEST - 2 VIEW COMPARISON:  08/22/2017 FINDINGS: Heart size is normal. There is mild aortic atherosclerosis without aneurysm. No acute pulmonary consolidation. Mild chronic interstitial prominence and bibasilar atelectasis is identified. No overt pulmonary edema. ACDF of the lower cervical spine. No acute osseous abnormality. IMPRESSION: 1. Chronic mild interstitial lung disease and bibasilar atelectasis. 2. Minimal aortic atherosclerosis. 3. No acute pulmonary disease Electronically Signed   By: Ashley Royalty M.D.   On: 07/11/2018 18:08   Ct Chest Wo Contrast  Result Date: 07/11/2018 CLINICAL DATA:  Nausea and vomiting for 1 week. Recurrent chest pain for 6 months. Shortness of breath and dizziness with exertion. Productive cough. EXAM: CT CHEST WITHOUT CONTRAST TECHNIQUE: Multidetector CT imaging of the chest was performed following the standard protocol without IV  contrast. COMPARISON:  High-resolution chest CT 09/26/2017 FINDINGS: Cardiovascular: Normal heart size. Minimal pericardial effusion. Normal caliber thoracic aorta with scattered calcifications. Mediastinum/Nodes: Mild prominence of mediastinal lymph nodes with pretracheal nodes measuring up to about 10 mm short axis dimension. Similar appearance to previous study. Likely to be reactive. Esophagus is decompressed. Thyroid gland is unremarkable. Lungs/Pleura: Scattered areas of interstitial fibrosis in the lungs. Bronchial wall thickening suggesting chronic bronchitis. Bronchiectasis with mucous plugging in the lung bases. Patchy focal areas of consolidation in the lung bases, more prominent on the right. Consolidative changes are new since previous study suggesting interval development of an acute bronchopneumonia. No pleural effusions. No pneumothorax. Upper Abdomen: No acute changes identified. Vascular calcifications. Musculoskeletal: Degenerative changes in the thoracic spine. No destructive bone lesions. IMPRESSION: 1. Patchy areas of consolidation in the lung bases, more prominent on the right, suggesting interval development of acute bronchopneumonia. 2. Chronic bronchitic changes with bronchiectasis and mucous plugging in the lung bases. 3. Mild mediastinal lymphadenopathy, likely reactive. 4. Minimal pericardial effusion. Aortic Atherosclerosis (ICD10-I70.0). Electronically Signed   By: Lucienne Capers M.D.   On: 07/11/2018 23:55   Ct Abdomen Pelvis W Contrast  Result Date: 08/06/2018 CLINICAL DATA:  Upper abdominal pain with elevated white blood cell count EXAM: CT ABDOMEN AND PELVIS WITH CONTRAST TECHNIQUE: Multidetector CT imaging of the abdomen and pelvis was performed using the standard protocol following bolus administration of intravenous contrast. CONTRAST:  15m OMNIPAQUE IOHEXOL 300 MG/ML  SOLN COMPARISON:  07/18/2018 FINDINGS: Lower chest: Previously seen right-sided effusion and right  lower lobe consolidation has resolved in the interval. No acute abnormality is noted. Hepatobiliary: Mild fatty infiltration of the liver is noted. The gallbladder is well distended without focal abnormality. Pancreas: Unremarkable. No pancreatic ductal dilatation or surrounding inflammatory changes. Spleen: Normal in size without focal abnormality. Adrenals/Urinary Tract: Adrenal glands are within normal limits. The kidneys demonstrate a normal enhancement pattern without evidence of obstructive change. Tiny nonobstructing left renal stone is again noted and stable. The bladder is partially distended. Stomach/Bowel: The appendix is within normal limits. No obstructive or inflammatory changes of the bowel are seen. Vascular/Lymphatic: Aortic atherosclerosis. No enlarged abdominal or pelvic lymph nodes. Reproductive: Some uterine calcifications are noted likely related to mild fibroid change. Other: No abdominal wall hernia or abnormality. No abdominopelvic ascites. Musculoskeletal: Degenerative changes and postoperative changes of lumbar spine are seen and stable. Mild anterolisthesis of L5 on S1 is again noted and stable. IMPRESSION: Resolution of previously seen right basilar changes. Stable nonobstructing left renal stone No acute abnormality is identified to correspond with the patient's given clinical history. Electronically Signed   By: MInez Catalina  M.D.   On: 08/06/2018 18:32   Dg Chest Port 1v Same Day  Result Date: 07/14/2018 CLINICAL DATA:  Shortness of breath EXAM: PORTABLE CHEST 1 VIEW COMPARISON:  CT chest dated 07/11/2018 FINDINGS: Patchy right lower lobe opacity, suspicious for pneumonia. Associated trace right pleural effusion. Bilateral lower lobe bronchiectasis. No pneumothorax. The heart is normal in size. Cervical spine fixation hardware. IMPRESSION: Patchy right lower lobe opacity, suspicious for pneumonia. Associated trace right pleural effusion. Electronically Signed   By: Julian Hy M.D.   On: 07/14/2018 08:20       Subjective: Denies any nausea vomiting or abdominal pain.  Chest pain better.  Discharge Exam: Vitals:   08/08/18 0743 08/08/18 0958  BP:  134/66  Pulse:  70  Resp:  18  Temp:    SpO2: 98% 96%   Vitals:   08/08/18 0437 08/08/18 0740 08/08/18 0743 08/08/18 0958  BP: 130/63   134/66  Pulse: 66   70  Resp: 16   18  Temp: 98.3 F (36.8 C)     TempSrc: Oral     SpO2: 100% 98% 98% 96%  Weight:      Height:        General: Not in distress HEENT: Moist mucosa, supple neck Chest: Clear bilaterally, reproducible pain on pressure over substernal area CVs: Normal S1-S2, no murmurs GI: Soft, nondistended, nontender Muscular skeletal: Warm, no edema    The results of significant diagnostics from this hospitalization (including imaging, microbiology, ancillary and laboratory) are listed below for reference.     Microbiology: Recent Results (from the past 240 hour(s))  Culture, blood (routine x 2)     Status: None (Preliminary result)   Collection Time: 08/07/18  8:45 AM  Result Value Ref Range Status   Specimen Description   Final    BLOOD RIGHT ARM Performed at [facility], [place] [address] [place], [place] [address]    Special Requests   Final    BOTTLES DRAWN AEROBIC AND ANAEROBIC Blood Culture adequate volume Performed at [facility] [address], [place], [place] [address]    Culture   Final    NO GROWTH < 24 HOURS Performed at [facility] [address], [place], [place] [address]    Report Status PENDING  Incomplete  Culture, blood (routine x 2)     Status: None (Preliminary result)   Collection Time: 08/07/18  8:45 AM  Result Value Ref Range Status   Specimen Description   Final    BLOOD RIGHT HAND Performed at [facility] [address] [place], [place] [address]    Special Requests   Final    BOTTLES DRAWN AEROBIC ONLY Blood Culture  adequate volume Performed at [facility] [address], [place], [place] [address]    Culture   Final    NO GROWTH < 24 HOURS Performed at [facility] [address], [place], [place] [address]    Report Status PENDING  Incomplete     Labs: BNP (last 3 results) No results for input(s): BNP in the last 8760 hours. Basic Metabolic Panel: Recent Labs  Lab 08/06/18 1438 08/06/18 2336 08/07/18 0530 08/08/18 0430  NA 139  --  141 141  K 4.1  --  3.5 4.1  CL 101  --  110 112*  CO2 24  --  23 24  GLUCOSE 197*  --  127* 107*  BUN 34*  --  24* 19  CREATININE 1.73* 1.44* 1.39* 1.11*  CALCIUM 9.1  --  7.8* 8.2*   Liver Function Tests: Recent Labs  Lab 08/06/18 2029 08/07/18 0530  AST 25 32  ALT 20 23  ALKPHOS 87 81  BILITOT 0.6 0.4  PROT 6.3* 5.7*  ALBUMIN 2.5* 2.3*   Recent Labs  Lab 08/06/18 2029  LIPASE 42   No results for input(s): AMMONIA in the last 168 hours. CBC: Recent Labs  Lab 08/06/18 1438 08/06/18 2336 08/07/18 0530 08/08/18 0430 08/08/18 1023  WBC 17.9* 23.9* 28.0* 10.8* 17.5*  NEUTROABS 9.3*  --   --   --   --   HGB 12.4 10.2* 10.2* 9.2* 9.5*  HCT 38.9 32.4* 32.3* 30.3* 31.7*  MCV 90.3 92.6 93.6 93.8 96.4  PLT 504.0* 409* 379 363 394   Cardiac Enzymes: Recent Labs  Lab 08/06/18 2336 08/07/18 0530 08/07/18 1142  TROPONINI 0.13* 0.11* 0.10*   BNP: Invalid input(s): POCBNP CBG: Recent Labs  Lab 08/07/18 0741 08/07/18 1141 08/07/18 1658 08/07/18 2050 08/08/18 0727  GLUCAP 127* 120* 154* 134* 96   D-Dimer No results for input(s): DDIMER in the last 72 hours. Hgb A1c No results for input(s): HGBA1C in the last 72 hours. Lipid Profile No results for input(s): CHOL, HDL, LDLCALC, TRIG, CHOLHDL, LDLDIRECT in the last 72 hours. Thyroid function studies Recent Labs    08/06/18 2336  TSH 3.398   Anemia work up No results for input(s): VITAMINB12, FOLATE, FERRITIN, TIBC, IRON, RETICCTPCT in the last 72  hours. Urinalysis    Component Value Date/Time   COLORURINE YELLOW 08/06/2018 1932   APPEARANCEUR CLEAR 08/06/2018 1932   LABSPEC >1.046 (H) 08/06/2018 1932   PHURINE 6.0 08/06/2018 1932   GLUCOSEU NEGATIVE 08/06/2018 [place] NEGATIVE 08/06/2018 1932   HGBUR negative 03/11/2008 1317   BILIRUBINUR NEGATIVE 08/06/2018 1932   BILIRUBINUR n 08/07/2017 1558   KETONESUR 5 (A) 08/06/2018 1932   PROTEINUR 100 (A) 08/06/2018 1932   UROBILINOGEN 1.0 08/07/2017 1558   UROBILINOGEN 0.2 08/16/2014 1513   NITRITE NEGATIVE 08/06/2018 1932   LEUKOCYTESUR NEGATIVE 08/06/2018 1932   Sepsis Labs Invalid input(s): PROCALCITONIN,  WBC,  LACTICIDVEN Microbiology Recent Results (from the past 240 hour(s))  Culture, blood (routine x 2)     Status: None (Preliminary result)   Collection Time: 08/07/18  8:45 AM  Result Value Ref Range Status   Specimen Description   Final    BLOOD RIGHT ARM Performed at [facility], [place] [address] [place], [place] [address]    Special Requests   Final    BOTTLES DRAWN AEROBIC AND ANAEROBIC Blood Culture adequate volume Performed at [facility] [address] [place], [place] [address]    Culture   Final    NO GROWTH < 24 HOURS Performed at [facility] [address] [place], [place] [address]    Report Status PENDING  Incomplete  Culture, blood (routine x 2)     Status: None (Preliminary result)   Collection Time: 08/07/18  8:45 AM  Result Value Ref Range Status   Specimen Description   Final    BLOOD RIGHT HAND Performed at [facility] [address] [place], [place] [address]    Special Requests   Final    BOTTLES DRAWN AEROBIC ONLY Blood Culture adequate volume Performed at [facility] [address], [place], [place] [address]    Culture   Final  NO GROWTH < 24 HOURS Performed at [facility] [address] [place], [place] [address]     Report Status PENDING  Incomplete     Time coordinating discharge: <30 minutes  SIGNED:   Louellen Molder, MD  [facility] 08/08/2018, 11:43 AM Pager   If 7PM-7AM, please contact night-coverage [email or web address] Password [email or web address]

## 2018-08-09 ENCOUNTER — Other Ambulatory Visit: Payer: Self-pay | Admitting: *Deleted

## 2018-08-09 ENCOUNTER — Telehealth: Payer: Self-pay | Admitting: Hematology and Oncology

## 2018-08-09 ENCOUNTER — Telehealth: Payer: Self-pay | Admitting: *Deleted

## 2018-08-09 NOTE — Telephone Encounter (Signed)
Transition Care Management Follow-up Telephone Call   Date discharged?08/08/18 Admit date: 08/06/2018 Discharge date: 08/08/2018  Admit date: 08/06/2018 Discharge date: 08/08/2018  Admitted From: Home Disposition: Home  Recommendations for Outpatient Follow-up:  Follow up with PCP in 1 week.  Please check CBC Follow-up with hematology Dr. Chilton Si in 2 weeks (I have called the office and left a message for appointment)  Home Health: None Equipment/Devices: None  Discharge Condition: Fair CODE STATUS: Full code Diet recommendation: Heart Healthy / Carb Modified     Discharge Diagnoses:  Principal Problem:   Acute gastritis without bleeding Admitted From: Home Disposition: Home  Recommendations for Outpatient Follow-up:  Follow up with PCP in 1 week.  Please check CBC Follow-up with hematology Dr. Chilton Si in 2 weeks (I have called the office and left a message for appointment)  Home Health: None Equipment/Devices: None  Discharge Condition: Fair CODE STATUS: Full code Diet recommendation: Heart Healthy / Carb Modified     Discharge Diagnoses:  Principal Problem:   Acute gastritis without bleeding   How have you been since you were released from the hospital? A little tired   Do you understand why you were in the hospital? yes   Do you understand the discharge instructions? yes   Where were you discharged to? home   Items Reviewed:  Medications reviewed: yes  Allergies reviewed: yes  Dietary changes reviewed: heart healthy  Referrals reviewed: yes   Functional Questionnaire:   Activities of Daily Living (ADLs):   She states they are independent in the following: none States they require assistance with the following: none   Any transportation issues/concerns?: no   Any patient concerns? no   Confirmed importance and date/time of follow-up visits scheduled yes  Provider Appointment booked with  Dr Fabian Sharp 08/21/18 at  2:30  Confirmed with patient if condition begins to worsen call PCP or go to the ER.  Patient was given the office number and encouraged to call back with question or concerns.  : yes

## 2018-08-09 NOTE — Telephone Encounter (Signed)
Pt cld back to r/s appt to see Dr. Pamelia Hoit on 10/23 at 215pm. Pt aware to arrive 30 minutes early.

## 2018-08-09 NOTE — Patient Outreach (Signed)
[facility]) Care Management  08/09/2018  Desiree [name] 1950-11-24 [id number]   Noted that member was admitted to hospital for observation for gastritis, discharged on 10/17.  Call placed to member to follow up on discharge.  She report she is feeling much better, appetite still has not fully returned but pain has decreased.  Denies any shortness of breath.  Discussed missed home visit, rescheduled for within the next 2 weeks.  Advised to contact this care manager with questions.  [facility] CM Care Plan Problem One     Most Recent Value  Care Plan Problem One  Risk for readmission related to respiratory distress as evidenced by recent hospitalization for pneumonia  Role Documenting the Problem One  Care Management [place] for Problem One  Active  [facility] Long Term Goal   Member will not be readmitted to hospital within 31 days of discharge  [facility] Long Term Goal Start Date  07/24/18  Interventions for Problem One Long Term Goal  Member educated on Know before you Go, upcoming appointments with hematologist and pulmonologist reviewed, confirmed transportation.  [facility] CM Short Term Goal #1   Member will keep and attend follow up appointment with pulmonologist within the next 2 weeks  [facility] CM Short Term Goal #1 Start Date  07/24/18  [facility] CM Short Term Goal #1 Met Date  08/09/18  [facility] CM Short Term Goal #2   Member will report taking all medications as prescribed over the next 4 weeks  [facility] CM Short Term Goal #2 Start Date  07/24/18  Interventions for Short Term Goal #2  Medication changes reviewed with member     Valente David, RN, MSN [place] Manager [phone]

## 2018-08-09 NOTE — Telephone Encounter (Signed)
Pt has been scheduled for a new hematology referral 10/23 at 2:15. Nothing further needed.

## 2018-08-09 NOTE — Telephone Encounter (Signed)
lft the pt a vm to cbb and reschedule appt

## 2018-08-12 ENCOUNTER — Other Ambulatory Visit: Payer: Self-pay | Admitting: Pharmacy Technician

## 2018-08-12 ENCOUNTER — Other Ambulatory Visit: Payer: Self-pay | Admitting: Family Medicine

## 2018-08-12 ENCOUNTER — Ambulatory Visit: Payer: Self-pay | Admitting: Pharmacy Technician

## 2018-08-12 LAB — CULTURE, BLOOD (ROUTINE X 2)
Culture: NO GROWTH
Culture: NO GROWTH
Special Requests: ADEQUATE
Special Requests: ADEQUATE

## 2018-08-12 NOTE — Patient Outreach (Signed)
[facility]) Care Management  08/12/2018  Desiree [name] 1951/01/23 [id number]   Successful call to patient, HIPAA identifiers verified. Called to confirm if that [employer] patient assistance application has been received. Desiree [name] states that she has not been to her mailbox since her hospitalization. She states she would go between today and tomorrow. I informed her that I just need to know whether or not a new one needs to be mailed out. She stated she understands.  Will follow up with patient in 2-3 business days if she has not returned my call to inform me.  Desiree [name] Desiree [name] [facility] Care Management [phone]

## 2018-08-13 NOTE — Telephone Encounter (Signed)
[name] patient. Thanks!

## 2018-08-14 ENCOUNTER — Inpatient Hospital Stay: Payer: Medicare HMO

## 2018-08-14 ENCOUNTER — Telehealth: Payer: Self-pay | Admitting: Hematology and Oncology

## 2018-08-14 ENCOUNTER — Inpatient Hospital Stay (HOSPITAL_BASED_OUTPATIENT_CLINIC_OR_DEPARTMENT_OTHER): Payer: Medicare HMO | Admitting: Hematology and Oncology

## 2018-08-14 DIAGNOSIS — N183 Chronic kidney disease, stage 3 (moderate): Secondary | ICD-10-CM

## 2018-08-14 DIAGNOSIS — R7989 Other specified abnormal findings of blood chemistry: Secondary | ICD-10-CM | POA: Diagnosis not present

## 2018-08-14 DIAGNOSIS — D721 Eosinophilia, unspecified: Secondary | ICD-10-CM

## 2018-08-14 DIAGNOSIS — E1122 Type 2 diabetes mellitus with diabetic chronic kidney disease: Secondary | ICD-10-CM

## 2018-08-14 LAB — CBC WITH DIFFERENTIAL (CANCER CENTER ONLY)
Abs Immature Granulocytes: 0.09 10*3/uL — ABNORMAL HIGH (ref 0.00–0.07)
Basophils Absolute: 0.2 10*3/uL — ABNORMAL HIGH (ref 0.0–0.1)
Basophils Relative: 1 %
Eosinophils Absolute: 10 10*3/uL — ABNORMAL HIGH (ref 0.0–0.5)
Eosinophils Relative: 47 %
HCT: 36.5 % (ref 36.0–46.0)
Hemoglobin: 11.7 g/dL — ABNORMAL LOW (ref 12.0–15.0)
Immature Granulocytes: 0 %
Lymphocytes Relative: 9 %
Lymphs Abs: 1.9 10*3/uL (ref 0.7–4.0)
MCH: 29.3 pg (ref 26.0–34.0)
MCHC: 32.1 g/dL (ref 30.0–36.0)
MCV: 91.3 fL (ref 80.0–100.0)
Monocytes Absolute: 0.9 10*3/uL (ref 0.1–1.0)
Monocytes Relative: 4 %
Neutro Abs: 8.2 10*3/uL — ABNORMAL HIGH (ref 1.7–7.7)
Neutrophils Relative %: 39 %
Platelet Count: 447 10*3/uL — ABNORMAL HIGH (ref 150–400)
RBC: 4 MIL/uL (ref 3.87–5.11)
RDW: 15.2 % (ref 11.5–15.5)
WBC Count: 21.3 10*3/uL — ABNORMAL HIGH (ref 4.0–10.5)
nRBC: 0 % (ref 0.0–0.2)

## 2018-08-14 LAB — VITAMIN B12: Vitamin B-12: 1580 pg/mL — ABNORMAL HIGH (ref 180–914)

## 2018-08-14 NOTE — Telephone Encounter (Signed)
Gave avs and calendar ° °

## 2018-08-14 NOTE — Assessment & Plan Note (Signed)
Severe eosinophilia since 07/11/2018 08/06/2018: WBC 17.9, absolute eosinophil count 5.5 07/30/2018 WBC 44.2, absolute eosinophil count 33.7 07/18/2018: WBC 30, absolute eosinophil count 0.4 08/07/2017: WBC 8.8, absolute eosinophil count 0.2  Recent hospitalizations 08/06/2018 to 08/08/2018 for COPD, bronchiectasis, pneumonia, hypertrophic cardiomyopathy, diabetes, CKD
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
## 2018-08-15 ENCOUNTER — Ambulatory Visit: Payer: Self-pay | Admitting: Pharmacy Technician

## 2018-08-15 ENCOUNTER — Other Ambulatory Visit: Payer: Self-pay | Admitting: Pharmacy Technician

## 2018-08-15 NOTE — Patient Outreach (Addendum)
[facility]) Care Management  08/15/2018  Desiree [name] 19-Sep-1951 [id number]   Unsuccessful call to patient, HIPAA compliant voicemail left. 3rd cal made in regards to confirming if patient has received patient assistance application in the mail. Patient answered to previous call attempts was that she had not been to her mailbox.   Will route note to [facility] RPh Colleen Summe for case closure.  [name] [name] [facility] Care Management [phone]    ADDENDUM 12:19pm  Incoming call from patient stating that she did check her mail and that she did not have the patient assistance application that was mailed to her.   I had spoken to [facility] and asked if she would take an application to patients home visit with her on 10/31.  I asked patient if it would be ok that [facility] bring application with her to appointment and she stated that, that would be fine. I also informed her that I would need her to sign the application and have her proof of income as well as OOP printouts from her pharmacy to give to [facility], she stated that she would get the documents needed.  Will route note to [facility] RPh Haynes Hoehn and RN Kemper Durie to update.

## 2018-08-16 LAB — TRYPTASE: Tryptase: 21.8 ug/L — ABNORMAL HIGH (ref 2.2–13.2)

## 2018-08-17 ENCOUNTER — Other Ambulatory Visit: Payer: Self-pay | Admitting: Pulmonary Disease

## 2018-08-19 ENCOUNTER — Other Ambulatory Visit: Payer: Self-pay | Admitting: Internal Medicine

## 2018-08-19 LAB — FLOW CYTOMETRY

## 2018-08-20 ENCOUNTER — Other Ambulatory Visit: Payer: Self-pay | Admitting: Gastroenterology

## 2018-08-20 DIAGNOSIS — K219 Gastro-esophageal reflux disease without esophagitis: Secondary | ICD-10-CM | POA: Diagnosis not present

## 2018-08-20 DIAGNOSIS — R112 Nausea with vomiting, unspecified: Secondary | ICD-10-CM | POA: Diagnosis not present

## 2018-08-20 DIAGNOSIS — R140 Abdominal distension (gaseous): Secondary | ICD-10-CM | POA: Diagnosis not present
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
## 2018-08-20 NOTE — Telephone Encounter (Signed)
Dr. Fabian Sharp, please advise on this refill. Thanks

## 2018-08-21 ENCOUNTER — Ambulatory Visit: Payer: Self-pay | Admitting: Internal Medicine

## 2018-08-21 DIAGNOSIS — H532 Diplopia: Secondary | ICD-10-CM | POA: Diagnosis not present

## 2018-08-21 DIAGNOSIS — E119 Type 2 diabetes mellitus without complications: Secondary | ICD-10-CM | POA: Diagnosis not present

## 2018-08-21 DIAGNOSIS — Z0289 Encounter for other administrative examinations: Secondary | ICD-10-CM

## 2018-08-21 DIAGNOSIS — H40021 Open angle with borderline findings, high risk, right eye: Secondary | ICD-10-CM | POA: Diagnosis not present

## 2018-08-21 DIAGNOSIS — H25013 Cortical age-related cataract, bilateral: Secondary | ICD-10-CM | POA: Diagnosis not present

## 2018-08-21 LAB — PDGFRA

## 2018-08-22 ENCOUNTER — Other Ambulatory Visit: Payer: Self-pay | Admitting: Pharmacy Technician

## 2018-08-22 ENCOUNTER — Encounter: Payer: Self-pay | Admitting: *Deleted

## 2018-08-22 ENCOUNTER — Other Ambulatory Visit: Payer: Self-pay | Admitting: *Deleted

## 2018-08-22 NOTE — Patient Outreach (Signed)
[facility]) Care Management  08/23/2018  Desiree [name] Apr 08, 1951 [id number]   Received patient portion of patient assistance applications as well as required documents. Submitted completed application to [employer].  Will follow up with company in 2-3 business days to check status of application.  [name] [name] Certified Pharmacy Technician [facility] Care Management [phone]

## 2018-08-22 NOTE — Patient Outreach (Signed)
[facility]) Care Management   08/22/2018  ESTA Desiree [name] [birth year] [id number]  Desiree [name] is an 67 y.o. female  Subjective:   Member alert and oriented x3, complains of abdominal pain, which has been ongoing.  She has appointment for testing for gastric emptying within the next 2 weeks.  Appointment with primary MD tomorrow.  Objective:   Review of Systems  Constitutional: Negative.   HENT: Negative.   Eyes: Negative.   Respiratory: Negative.   Cardiovascular: Negative.   Gastrointestinal: Positive for abdominal pain.  Genitourinary: Negative.   Musculoskeletal: Negative.   Skin: Negative.   Neurological: Negative.   Endo/Heme/Allergies: Negative.   Psychiatric/Behavioral: Negative.     Physical Exam  Constitutional: She is oriented to person, place, and time. She appears well-developed and well-nourished.  Neck: Normal range of motion.  Cardiovascular: Normal rate and regular rhythm.  Respiratory: Effort normal and breath sounds normal.  GI: Soft. Bowel sounds are normal. There is tenderness.  Musculoskeletal: Normal range of motion.  Neurological: She is alert and oriented to person, place, and time.  Skin: Skin is warm and dry.   BP (!) 98/56 (BP Location: Left Arm, Patient Position: Sitting, Cuff Size: Normal)   Pulse 82   Resp 18   Ht 1.6 m (5' 3" )   Wt 132 lb (59.9 kg)   SpO2 95%   BMI 23.38 kg/m   Encounter Medications:   Outpatient Encounter Medications as of 08/22/2018  Medication Sig Note  . acetaminophen (TYLENOL) 500 MG tablet Take 2 tablets (1,000 mg total) by mouth every 6 (six) hours as needed. (Patient taking differently: Take 1,000 mg by mouth every 6 (six) hours as needed for mild pain or headache. )   . atorvastatin (LIPITOR) 20 MG tablet TAKE 1 TABLET BY MOUTH ONCE DAILY   . Blood Glucose Monitoring Suppl (ACCU-CHEK NANO SMARTVIEW) W/DEVICE KIT Use to check blood sugar 1-2 times daily (Patient taking differently: 1 strip by  Other route 2 (two) times a week. )   . cetirizine (ZYRTEC) 10 MG tablet Take 10 mg by mouth at bedtime.    . diclofenac sodium (VOLTAREN) 1 % GEL Apply 3 grams to 3 large joints, up to 3 times daily. (Patient taking differently: Apply 3 g topically See admin instructions. Apply 3 grams to 3 large joints, up to 3 times daily)   . docusate sodium (COLACE) 100 MG capsule Take 100 mg by mouth at bedtime.    . DULoxetine (CYMBALTA) 60 MG capsule Take 1 capsule (60 mg total) by mouth daily. 08/15/2018: 08/02/2018- take 2 tabs (120 mg)  . feeding supplement, ENSURE ENLIVE, (ENSURE ENLIVE) LIQD Take 237 mLs by mouth 3 (three) times daily between meals.   . fluticasone (FLONASE) 50 MCG/ACT nasal spray Place 1-2 sprays into both nostrils at bedtime.    Marland Kitchen glucose blood test strip Accu-chek nano, test glucose once daily, dx E11.9   . guaiFENesin (MUCINEX) 600 MG 12 hr tablet Take 600 mg by mouth at bedtime.   Marland Kitchen ipratropium-albuterol (DUONEB) 0.5-2.5 (3) MG/3ML SOLN INHALE CONTENTS OF 1 VIAL THREE TIMES A DAY IN NEBULIZER (Patient taking differently: Take 3 mLs by nebulization 3 (three) times daily. )   . lamoTRIgine (LAMICTAL) 25 MG tablet Take 1 tablet (25 mg total) by mouth 2 (two) times daily.   . Lancets Misc. (ACCU-CHEK FASTCLIX LANCET) KIT USE TO CHECK BLOOD SUGAR 1-2 TIMES DAILY   . linaclotide (LINZESS) 145 MCG CAPS capsule Take 145 mcg by  mouth daily before breakfast.   . losartan (COZAAR) 50 MG tablet TAKE 1 TABLET BY MOUTH ONCE DAILY   . montelukast (SINGULAIR) 10 MG tablet TAKE 1 TABLET BY MOUTH AT BEDTIME   . Multiple Vitamins-Minerals (CENTRUM SILVER PO) Take 1 tablet by mouth daily.    Salley Scarlet FORMULARY FDgard: Take 1 capsule by mouth at bedtime   . NON FORMULARY Respivest: Three times a day for 30 minutes   . ondansetron (ZOFRAN-ODT) 4 MG disintegrating tablet DISSOLVE 1 TABLET(4 MG) ON THE TONGUE EVERY 8 HOURS AS NEEDED FOR NAUSEA OR VOMITING   . pantoprazole (PROTONIX) 40 MG tablet Take 40 mg  by mouth daily.   . Probiotic Product (ALIGN PO) Take 1 capsule by mouth daily with breakfast.    . Respiratory Therapy Supplies (FLUTTER) DEVI 1 Device by Does not apply route as directed. (Patient taking differently: 1 Device as directed. )   . sodium chloride HYPERTONIC 3 % nebulizer solution Take by nebulization 2 (two) times daily. (Patient taking differently: Take 4 mLs by nebulization 2 (two) times daily. )   . sucralfate (CARAFATE) 1 GM/10ML suspension Take 10 mLs (1 g total) by mouth 2 (two) times daily.   Marland Kitchen umeclidinium-vilanterol (ANORO ELLIPTA) 62.5-25 MCG/INH AEPB Inhale 1 puff into the lungs daily.    No facility-administered encounter medications on file as of 08/22/2018.     Functional Status:   In your present state of health, do you have any difficulty performing the following activities: 08/09/2018 08/07/2018  Hearing? N N  Vision? N N  Difficulty concentrating or making decisions? N N  Walking or climbing stairs? N N  Dressing or bathing? N N  Doing errands, shopping? N N  Preparing Food and eating ? N -  Using the Toilet? N -  In the past six months, have you accidently leaked urine? N -  Do you have problems with loss of bowel control? N -  Managing your Medications? N -  Managing your Finances? N -  Housekeeping or managing your Housekeeping? N -  Some recent data might be hidden    Fall/Depression Screening:    Fall Risk  08/22/2018 07/05/2017 01/12/2016  Falls in the past year? Yes No No  Number falls in past yr: 1 - 1  Injury with Fall? No - No  Risk for fall due to : - - Other (Comment)  Follow up Falls prevention discussed - -   PHQ 2/9 Scores 08/22/2018 07/22/2018 07/05/2017 01/12/2016 01/01/2015 11/06/2012  PHQ - 2 Score 0 0 0 - 0 0  Exception Documentation - - - Other- indicate reason in comment box - -  Not completed - - - sees  psychiatry for meds and depression - -    Assessment:    Met with member at scheduled time.  Assessment complete.  She  uses percussion vest 3 times a day, report slightly productive cough, yellow.  Also uses nebulizer for emergencies.  Report using scheduled inhalers as instructed.  Paperwork signed for medication assistance, given to [place] team.  Member report her main concern at this time is her abdominal pain.  She has been followed by GI specialist, more testing within the next couple weeks.  Denies any urgent concerns at this time.  Advised to contact this care manager with questions.  Plan:   Will follow up within the next month.  [facility] CM Care Plan Problem One     Most Recent Value  Care Plan Problem One  Risk for readmission related to respiratory distress as evidenced by recent hospitalization for pneumonia  Role Documenting the Problem One  Care Management Cook for Problem One  Active  [facility] Long Term Goal   Member will not be readmitted to hospital within 31 days of discharge  [facility] Long Term Goal Start Date  07/24/18  [facility] for Problem One Long Term Goal  Plan of care reviewed with member.  Confirmed she has equipment, DME, and medications in the home.  [facility] CM Short Term Goal #2   Member will report taking all medications as prescribed over the next 4 weeks  [facility] CM Short Term Goal #2 Start Date  07/24/18  [facility] CM Short Term Goal #2 Met Date  08/22/18     Valente David, RN, MSN [place] Manager [phone]
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
## 2018-08-23 ENCOUNTER — Encounter: Payer: Medicare HMO | Admitting: Internal Medicine

## 2018-08-23 DIAGNOSIS — Z0289 Encounter for other administrative examinations: Secondary | ICD-10-CM

## 2018-08-23 NOTE — Telephone Encounter (Signed)
She had hospitalization and I    havent seen her  Since may    Refill not approved  Today .  As her status has changed   Please  ask her GI doctor dr Loreta Ave?   Order as she sees fit.

## 2018-08-27 ENCOUNTER — Other Ambulatory Visit: Payer: Self-pay | Admitting: Pharmacy Technician

## 2018-08-27 ENCOUNTER — Ambulatory Visit: Payer: Self-pay | Admitting: Psychiatry

## 2018-08-27 NOTE — Patient Outreach (Signed)
[facility]) Care Management  08/27/2018  AMUNIQUE NEYRA 1951-08-28 [id number]   Follow up call to [employer] to check status of patients application for Anora Ellipta. Rosia confirmed patient has been approved as of 11/05 until 10/22/2018 and that patient should received medication in 7-10 business days.  Will follow up with patient in 10-14 business days to confirm medication has been received.  [name] Effie Shy CPhT Certified Pharmacy Technician [facility] Care Management Direct [phone]

## 2018-08-29 DIAGNOSIS — J479 Bronchiectasis, uncomplicated: Secondary | ICD-10-CM | POA: Diagnosis not present

## 2018-09-02 ENCOUNTER — Telehealth: Payer: Self-pay | Admitting: Hematology and Oncology

## 2018-09-02 NOTE — Telephone Encounter (Signed)
Returned patient call multiple times re confirming appointment date/time and what appointments is for. Not able to reach patient or leave message.

## 2018-09-03 ENCOUNTER — Ambulatory Visit (HOSPITAL_COMMUNITY)
Admission: RE | Admit: 2018-09-03 | Discharge: 2018-09-03 | Disposition: A | Discharge status: Home / Self Care | Payer: Medicare HMO | Source: Ambulatory Visit | Attending: Gastroenterology | Admitting: Gastroenterology

## 2018-09-03 DIAGNOSIS — R112 Nausea with vomiting, unspecified: Secondary | ICD-10-CM | POA: Diagnosis not present

## 2018-09-03 MED ORDER — TECHNETIUM TC 99M SULFUR COLLOID
1.9000 | Freq: Once | INTRAVENOUS | Status: AC | PRN
  Administered 2018-09-03: 1.9 via INTRAVENOUS

## 2018-09-04 ENCOUNTER — Telehealth: Payer: Self-pay | Admitting: Hematology and Oncology

## 2018-09-04 NOTE — Telephone Encounter (Signed)
Tried to reach regarding 11/12 sch msg

## 2018-09-05 ENCOUNTER — Inpatient Hospital Stay: Payer: Medicare HMO | Attending: Hematology and Oncology | Admitting: Hematology and Oncology

## 2018-09-05 ENCOUNTER — Telehealth: Payer: Self-pay | Admitting: Hematology and Oncology

## 2018-09-05 DIAGNOSIS — N189 Chronic kidney disease, unspecified: Secondary | ICD-10-CM | POA: Insufficient documentation

## 2018-09-05 DIAGNOSIS — R197 Diarrhea, unspecified: Secondary | ICD-10-CM | POA: Diagnosis not present

## 2018-09-05 DIAGNOSIS — J449 Chronic obstructive pulmonary disease, unspecified: Secondary | ICD-10-CM

## 2018-09-05 DIAGNOSIS — R110 Nausea: Secondary | ICD-10-CM

## 2018-09-05 DIAGNOSIS — E1122 Type 2 diabetes mellitus with diabetic chronic kidney disease: Secondary | ICD-10-CM | POA: Insufficient documentation

## 2018-09-05 DIAGNOSIS — I422 Other hypertrophic cardiomyopathy: Secondary | ICD-10-CM | POA: Insufficient documentation

## 2018-09-05 DIAGNOSIS — R269 Unspecified abnormalities of gait and mobility: Secondary | ICD-10-CM | POA: Diagnosis not present

## 2018-09-05 DIAGNOSIS — J479 Bronchiectasis, uncomplicated: Secondary | ICD-10-CM | POA: Diagnosis not present

## 2018-09-05 DIAGNOSIS — D721 Eosinophilia, unspecified: Secondary | ICD-10-CM

## 2018-09-05 NOTE — Progress Notes (Signed)
Patient Care Team: [name], [name], MD as PCP - General (Internal Medicine) [name], MD (Orthopedic Surgery) Cottle, Billey Co., MD as Attending Physician (Psychiatry) Comer Locket, PA-C as Physician Assistant (Physician Assistant) [name], MD as Consulting Physician (Gastroenterology) Elmarie Shiley, MD as Consulting Physician (Nephrology) Druscilla Brownie, MD as Consulting Physician (Dermatology) Arvella Nigh, MD as Consulting Physician (Obstetrics and Gynecology) Josue Hector, MD as Consulting Physician (Cardiology) Gean Birchwood, DPM as Consulting Physician (Podiatry) [name], MD as Consulting Physician (Pulmonary Disease) Bo Merino, MD as Consulting Physician (Rheumatology) Valente David, RN as [place] Management Summe, Octavio Graves, [facility] as [place] Management (Pharmacist) Adaline Sill, CPhT as [place] [facility] Management (Pharmacy Technician)  DIAGNOSIS:  Encounter Diagnosis  Name Primary?  . Eosinophilia      CHIEF COMPLIANT: Follow-up on blood work for eosinophilia  INTERVAL HISTORY: Desiree [name] is a 67 year old with above-mentioned history of eosinophilia who underwent extensive blood work and is here today to discuss the results.  His hemophilia has been prevalent for the past 2 to 3 months.  She was hospitalized in September and October for bronchiectasis and community-acquired pneumonia along with pulmonary fibrosis and sinusitis.  During the hospitalization she had elevation of white blood cell count along with eosinophil counts.  She was referred to [place] for further evaluation.  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  She continues to have nausea symptoms as well as diarrhea.  REVIEW OF SYSTEMS:   Constitutional: Denies fevers, chills or abnormal weight loss Eyes: Denies blurriness of vision Ears, nose, mouth, throat, and face: Denies  mucositis or sore throat Respiratory: Denies cough, dyspnea or wheezes Cardiovascular: Denies palpitation, chest discomfort Gastrointestinal: Nausea and diarrhea Skin: Denies abnormal skin rashes Lymphatics: Denies new lymphadenopathy or easy bruising Neurological:Denies numbness, tingling or new weaknesses Behavioral/Psych: Mood is stable, no new changes  Extremities: No lower extremity edema  All other systems were reviewed with the patient and are negative.  I have reviewed the past medical history, past surgical history, social history and family history with the patient and they are unchanged from previous note.  ALLERGIES:  is allergic to prednisone; solu-medrol [methylprednisolone]; amoxicillin; codeine; hydrocodone-acetaminophen; and penicillins.  MEDICATIONS:  Current Outpatient Medications  Medication Sig Dispense Refill  . acetaminophen (TYLENOL) 500 MG tablet Take 2 tablets (1,000 mg total) by mouth every 6 (six) hours as needed. (Patient taking differently: Take 1,000 mg by mouth every 6 (six) hours as needed for mild pain or headache. ) 30 tablet 0  . atorvastatin (LIPITOR) 20 MG tablet TAKE 1 TABLET BY MOUTH ONCE DAILY 90 tablet 0  . Blood Glucose Monitoring Suppl (ACCU-CHEK NANO SMARTVIEW) W/DEVICE KIT Use to check blood sugar 1-2 times daily (Patient taking differently: 1 strip by Other route 2 (two) times a week. ) 1 kit 0  . cetirizine (ZYRTEC) 10 MG tablet Take 10 mg by mouth at bedtime.     . diclofenac sodium (VOLTAREN) 1 % GEL Apply 3 grams to 3 large joints, up to 3 times daily. (Patient taking differently: Apply 3 g topically See admin instructions. Apply 3 grams to 3 large joints, up to 3 times daily) 3 Tube 3  . docusate sodium (COLACE) 100 MG capsule Take 100 mg by mouth at bedtime.     . DULoxetine (CYMBALTA) 60 MG capsule Take 1 capsule (60 mg total) by mouth daily. 30 capsule 0  .  feeding supplement, ENSURE ENLIVE, (ENSURE ENLIVE) LIQD Take 237 mLs by mouth 3  (three) times daily between meals. 14 Bottle 0  . fluticasone (FLONASE) 50 MCG/ACT nasal spray Place 1-2 sprays into both nostrils at bedtime.     Marland Kitchen glucose blood test strip Accu-chek nano, test glucose once daily, dx E11.9 100 each 12  . guaiFENesin (MUCINEX) 600 MG 12 hr tablet Take 600 mg by mouth at bedtime.    Marland Kitchen ipratropium-albuterol (DUONEB) 0.5-2.5 (3) MG/3ML SOLN INHALE CONTENTS OF 1 VIAL THREE TIMES A DAY IN NEBULIZER (Patient taking differently: Take 3 mLs by nebulization 3 (three) times daily. ) 1080 mL 4  . lamoTRIgine (LAMICTAL) 25 MG tablet Take 1 tablet (25 mg total) by mouth 2 (two) times daily. 60 tablet 1  . Lancets Misc. (ACCU-CHEK FASTCLIX LANCET) KIT USE TO CHECK BLOOD SUGAR 1-2 TIMES DAILY 1 kit 0  . linaclotide (LINZESS) 145 MCG CAPS capsule Take 145 mcg by mouth daily before breakfast.    . losartan (COZAAR) 50 MG tablet TAKE 1 TABLET BY MOUTH ONCE DAILY 90 tablet 0  . montelukast (SINGULAIR) 10 MG tablet TAKE 1 TABLET BY MOUTH AT BEDTIME 90 tablet 0  . Multiple Vitamins-Minerals (CENTRUM SILVER PO) Take 1 tablet by mouth daily.     Salley Scarlet FORMULARY FDgard: Take 1 capsule by mouth at bedtime    . NON FORMULARY Respivest: Three times a day for 30 minutes    . ondansetron (ZOFRAN-ODT) 4 MG disintegrating tablet DISSOLVE 1 TABLET(4 MG) ON THE TONGUE EVERY 8 HOURS AS NEEDED FOR NAUSEA OR VOMITING 20 tablet 0  . pantoprazole (PROTONIX) 40 MG tablet Take 40 mg by mouth daily.    . Probiotic Product (ALIGN PO) Take 1 capsule by mouth daily with breakfast.     . Respiratory Therapy Supplies (FLUTTER) DEVI 1 Device by Does not apply route as directed. (Patient taking differently: 1 Device as directed. ) 1 each 0  . sodium chloride HYPERTONIC 3 % nebulizer solution Take by nebulization 2 (two) times daily. (Patient taking differently: Take 4 mLs by nebulization 2 (two) times daily. ) 750 mL 5  . sucralfate (CARAFATE) 1 GM/10ML suspension Take 10 mLs (1 g total) by mouth 2 (two) times  daily. 420 mL 0  . umeclidinium-vilanterol (ANORO ELLIPTA) 62.5-25 MCG/INH AEPB Inhale 1 puff into the lungs daily. 60 each 5   No current facility-administered medications for this visit.     PHYSICAL EXAMINATION: ECOG PERFORMANCE STATUS: 1 - Symptomatic but completely ambulatory  Vitals:   09/05/18 1514  BP: 115/76  Pulse: (!) 123  Resp: 17  Temp: 99.1 F (37.3 C)  SpO2: (!) 87%   Filed Weights   09/05/18 1514  Weight: 134 lb 9.6 oz (61.1 kg)    GENERAL:alert, no distress and comfortable SKIN: skin color, texture, turgor are normal, no rashes or significant lesions EYES: normal, Conjunctiva are pink and non-injected, sclera clear OROPHARYNX:no exudate, no erythema and lips, buccal mucosa, and tongue normal  NECK: supple, thyroid normal size, non-tender, without nodularity LYMPH:  no palpable lymphadenopathy in the cervical, axillary or inguinal LUNGS: clear to auscultation and percussion with normal breathing effort HEART: regular rate & rhythm and no murmurs and no lower extremity edema ABDOMEN:abdomen soft, non-tender and normal bowel sounds MUSCULOSKELETAL:no cyanosis of digits and no clubbing  NEURO: alert & oriented x 3 with fluent speech, no focal motor/sensory deficits EXTREMITIES: No lower extremity edema   LABORATORY DATA:  I have reviewed the data as  listed CMP Latest Ref Rng & Units 08/08/2018 08/07/2018 08/06/2018  Glucose 70 - 99 mg/dL 107(H) 127(H) -  BUN 8 - 23 mg/dL 19 24(H) -  Creatinine 0.44 - 1.00 mg/dL 1.11(H) 1.39(H) 1.44(H)  Sodium 135 - 145 mmol/L 141 141 -  Potassium 3.5 - 5.1 mmol/L 4.1 3.5 -  Chloride 98 - 111 mmol/L 112(H) 110 -  CO2 22 - 32 mmol/L 24 23 -  Calcium 8.9 - 10.3 mg/dL 8.2(L) 7.8(L) -  Total Protein 6.5 - 8.1 g/dL - 5.7(L) -  Total Bilirubin 0.3 - 1.2 mg/dL - 0.4 -  Alkaline Phos 38 - 126 U/L - 81 -  AST 15 - 41 U/L - 32 -  ALT 0 - 44 U/L - 23 -    Lab Results  Component Value Date   WBC 21.3 (H) 08/14/2018   HGB 11.7  (L) 08/14/2018   HCT 36.5 08/14/2018   MCV 91.3 08/14/2018   PLT 447 (H) 08/14/2018   NEUTROABS 8.2 (H) 08/14/2018    ASSESSMENT & PLAN:  EOSINOPHILIA Severe eosinophilia since 07/11/2018 08/06/2018: WBC 17.9, absolute eosinophil count 5.5 07/30/2018 WBC 44.2, absolute eosinophil count 33.7 07/18/2018: WBC 30, absolute eosinophil count 0.4 08/07/2017: WBC 8.8, absolute eosinophil count 0.2  Recent hospitalizations 08/06/2018 to 08/08/2018 for COPD, bronchiectasis, pneumonia, hypertrophic cardiomyopathy, diabetes, CKD Hospitalization 07/11/2018-07/20/2018: Gastritis  Eosinophilia work-up in the hospital: Negative for spondylitis, Aspergillus, ANCA IgE markedly elevated  Lab review: 08/14/2018: WBC 21.3, platelets 447, absolute eosinophil count 10 Flow cytometry no abnormalities PD GFR alpha: Negative Serum tryptase 21.8 elevated suggestive of mast cell disorder.  However there was no mast cell infiltration on the stomach and duodenal biopsies.  Negative PD GFR alpha rules out hypereosinophilic syndrome  Recommendation: Recheck blood work in 3 months and if continues to have eosinophilia then we plan to do that bone marrow biopsy.   No orders of the defined types were placed in this encounter.  The patient has a good understanding of the overall plan. she agrees with it. she will call with any problems that may develop before the next visit here.   Harriette Ohara, MD 09/05/18

## 2018-09-05 NOTE — Assessment & Plan Note (Signed)
Severe eosinophilia since 07/11/2018 08/06/2018: WBC 17.9, absolute eosinophil count 5.5 07/30/2018 WBC 44.2, absolute eosinophil count 33.7 07/18/2018: WBC 30, absolute eosinophil count 0.4 08/07/2017: WBC 8.8, absolute eosinophil count 0.2  Recent hospitalizations 08/06/2018 to 08/08/2018 for COPD, bronchiectasis, pneumonia, hypertrophic cardiomyopathy, diabetes, CKD Hospitalization 07/11/2018-07/20/2018: Gastritis  Eosinophilia work-up in the hospital: Negative for spondylitis, Aspergillus, ANCA IgE markedly elevated  Lab review: 08/14/2018: WBC 21.3, platelets 447, absolute eosinophil count 10 Flow cytometry no abnormalities PD GFR alpha: Negative Serum tryptase 21.8 elevated suggestive of mast cell disorder.  Negative PD GFR alpha rules out hypereosinophilic syndrome  Recommendation: Bone marrow biopsy

## 2018-09-05 NOTE — Telephone Encounter (Signed)
Printed calendar and avs. °

## 2018-09-06 ENCOUNTER — Ambulatory Visit (INDEPENDENT_AMBULATORY_CARE_PROVIDER_SITE_OTHER): Payer: Medicare HMO | Admitting: Internal Medicine

## 2018-09-06 ENCOUNTER — Other Ambulatory Visit: Payer: Self-pay | Admitting: Internal Medicine

## 2018-09-06 ENCOUNTER — Encounter: Payer: Self-pay | Admitting: Internal Medicine

## 2018-09-06 VITALS — BP 112/54 | HR 112 | Temp 98.2°F | Wt 135.7 lb

## 2018-09-06 DIAGNOSIS — Z79899 Other long term (current) drug therapy: Secondary | ICD-10-CM | POA: Diagnosis not present

## 2018-09-06 DIAGNOSIS — D721 Eosinophilia, unspecified: Secondary | ICD-10-CM

## 2018-09-06 DIAGNOSIS — Z09 Encounter for follow-up examination after completed treatment for conditions other than malignant neoplasm: Secondary | ICD-10-CM | POA: Diagnosis not present

## 2018-09-06 DIAGNOSIS — R768 Other specified abnormal immunological findings in serum: Secondary | ICD-10-CM

## 2018-09-06 DIAGNOSIS — I739 Peripheral vascular disease, unspecified: Secondary | ICD-10-CM | POA: Diagnosis not present

## 2018-09-06 DIAGNOSIS — J479 Bronchiectasis, uncomplicated: Secondary | ICD-10-CM

## 2018-09-06 MED ORDER — AMLODIPINE BESYLATE 2.5 MG PO TABS: 2.5000 mg | ORAL_TABLET | Freq: Every day | ORAL | 1 refills | Status: DC

## 2018-09-06 MED ORDER — GLUCOSE BLOOD VI STRP: ORAL_STRIP | 12 refills | Status: DC

## 2018-09-06 MED ORDER — ACCU-CHEK FASTCLIX LANCET KIT: PACK | 1 refills | Status: DC

## 2018-09-06 MED ORDER — ACCU-CHEK FASTCLIX LANCET KIT: PACK | 0 refills | Status: DC

## 2018-09-06 NOTE — Patient Instructions (Addendum)
Agree with  Holding the losartan  blood pressure is low .  If you blood pressure rises to   130/80  Then can restart 1/2  losartan per day  But we are beginning you on   Low dose  medication amlodipine low dose      So stop for now .   The hands remind me of   Vasospasm like raynaud.    Hand warmers   for  now and warm  . r gloves   In the interim.    I would like you to see the rheumatologist April  Because of the recent .    Changes in your health and blood count.   Advise   Gi dr [address] and nutrition help .   I want you to see dr Rheumatologist     Earlier.  As possible . And we can try getting you in.   Wt Readings from Last 3 Encounters:  09/06/18 135 lb 11.2 oz (61.6 kg)  09/05/18 134 lb 9.6 oz (61.1 kg)  08/22/18 132 lb (59.9 kg)   Raynaud Phenomenon Raynaud phenomenon is a condition that affects the blood vessels (arteries) that carry blood to your fingers and toes. The arteries that supply blood to your ears or the tip of your nose might also be affected. Raynaud phenomenon causes the arteries to temporarily narrow. As a result, the flow of blood to the affected areas is temporarily decreased. This usually occurs in response to cold temperatures or stress. During an attack, the skin in the affected areas turns white. You may also feel tingling or numbness in those areas. Attacks usually last for only a brief period, and then the blood flow to the area returns to normal. In most cases, Raynaud phenomenon does not cause serious health problems. What are the causes? For many people with this condition, the cause is not known. Raynaud phenomenon is sometimes associated with other diseases, such as scleroderma or lupus. What increases the risk? Raynaud phenomenon can affect anyone, but it develops most often in people who are 66-80 years old. It affects more females than males. What are the signs or symptoms? Symptoms of Raynaud phenomenon may occur when you are exposed to cold temperatures  or when you have emotional stress. The symptoms may last for a few minutes or up to several hours. They usually affect your fingers but may also affect your toes, ears, or the tip of your nose. Symptoms may include:  Changes in skin color. The skin in the affected areas will turn pale or white. The skin may then change from white to bluish to red as normal blood flow returns to the area.  Numbness, tingling, or pain in the affected areas.  In severe cases, sores may develop in the affected areas. How is this diagnosed? Your health care provider will do a physical exam and take your medical history. You may be asked to put your hands in cold water to check for a reaction to cold temperature. Blood tests may be done to check for other diseases or conditions. Your health care provider may also order a test to check the movement of blood through your arteries and veins (vascular ultrasound). How is this treated? Treatment often involves making lifestyle changes and taking steps to control your exposure to cold temperatures. For more severe cases, medicine (calcium channel blockers) may be used to improve blood flow. Surgery is sometimes done to block the nerves that control the affected arteries, but this  is rare. Follow these instructions at home:  Avoid exposure to cold by taking these steps: ? If possible, stay indoors during cold weather. ? When you go outside during cold weather, dress in layers and wear mittens, a hat, a scarf, and warm footwear. ? Wear mittens or gloves when handling ice or frozen food. ? Use holders for glasses or cans containing cold drinks. ? Let warm water run for a while before taking a shower or bath. ? Warm up the car before driving in cold weather.  If possible, avoid stressful and emotional situations. Exercise, meditation, and yoga may help you cope with stress. Biofeedback may be useful.  Do not use any tobacco products, including cigarettes, chewing tobacco, or  electronic cigarettes. If you need help quitting, ask your health care provider.  Avoid secondhand smoke.  Limit your use of caffeine. Switch to decaffeinated coffee, tea, and soda. Avoid chocolate.  Wear loose fitting socks and comfortable, roomy shoes.  Avoid vibrating tools and machinery.  Take medicines only as directed by your health care provider. Contact a health care provider if:  Your discomfort becomes worse despite lifestyle changes.  You develop sores on your fingers or toes that do not heal.  Your fingers or toes turn black.  You have breaks in the skin on your fingers or toes.  You have a fever.  You have pain or swelling in your joints.  You have a rash.  Your symptoms occur on only one side of your body. This information is not intended to replace advice given to you by your health care provider. Make sure you discuss any questions you have with your health care provider. Document Released: 10/06/2000 Document Revised: 03/16/2016 Document Reviewed: 04/12/2016 [employer] Interactive Patient Education  2017 [employer].
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
## 2018-09-09 ENCOUNTER — Ambulatory Visit (INDEPENDENT_AMBULATORY_CARE_PROVIDER_SITE_OTHER): Payer: Medicare HMO | Admitting: Psychiatry

## 2018-09-09 DIAGNOSIS — F29 Unspecified psychosis not due to a substance or known physiological condition: Secondary | ICD-10-CM

## 2018-09-09 DIAGNOSIS — F32A Depression, unspecified: Secondary | ICD-10-CM

## 2018-09-09 DIAGNOSIS — F329 Major depressive disorder, single episode, unspecified: Secondary | ICD-10-CM | POA: Diagnosis not present

## 2018-09-09 DIAGNOSIS — F411 Generalized anxiety disorder: Secondary | ICD-10-CM

## 2018-09-09 MED ORDER — LAMOTRIGINE 25 MG PO TABS: 25.0000 mg | ORAL_TABLET | Freq: Two times a day (BID) | ORAL | 1 refills | Status: DC

## 2018-09-09 MED ORDER — DULOXETINE HCL 30 MG PO CPEP: ORAL_CAPSULE | ORAL | 1 refills | Status: DC

## 2018-09-09 NOTE — Progress Notes (Signed)
[facility]  [name],  MRN: [id number]  PCP: [name], MD  Date of Evaluation: 09/09/2018 Time spent:20 minutes  Chief Complaint:   HISTORY/CURRENT STATUS: HPI 67 year old black female last seen 05/08/2018.  Diagnosis of depression, anxiety with panic attack, history of bipolar disorder, history of psychosis. At that  Visit she was doing well no changes were made.  Since last visit she has been having GI problems.  Its cause her to become more depressed.  Eating well and not sleeping quite as well.  Denies manic symptoms and suicidal thoughts. She has had GI problems for the last 2 months.  Individual Medical History/ Review of Systems: Changes?  Questionable seizure disorder, hx serotonin syndrome, hx increase Qtc. Ros : hosp. 07/01/18 for pneumonia. Hosp 10/19 with copd and increased wbc.  Allergies: Prednisone; Solu-medrol [methylprednisolone]; Amoxicillin; Codeine; Hydrocodone-acetaminophen; and Penicillins  Current Medications:  Current Outpatient Medications:  .  acetaminophen (TYLENOL) 500 MG tablet, Take 2 tablets (1,000 mg total) by mouth every 6 (six) hours as needed. (Patient taking differently: Take 1,000 mg by mouth every 6 (six) hours as needed for mild pain or headache. ), Disp: 30 tablet, Rfl: 0 .  atorvastatin (LIPITOR) 20 MG tablet, TAKE 1 TABLET BY MOUTH ONCE DAILY, Disp: 90 tablet, Rfl: 0 .  cetirizine (ZYRTEC) 10 MG tablet, Take 10 mg by mouth at bedtime. , Disp: , Rfl:  .  diclofenac sodium (VOLTAREN) 1 % GEL, Apply 3 grams to 3 large joints, up to 3 times daily. (Patient taking differently: Apply 3 g topically See admin instructions. Apply 3 grams to 3 large joints, up to 3 times daily), Disp: 3 Tube, Rfl: 3 .  fluticasone (FLONASE) 50 MCG/ACT nasal spray, Place 1-2 sprays into both nostrils at bedtime. , Disp: , Rfl:  .  guaiFENesin (MUCINEX) 600 MG 12 hr tablet, Take 600 mg by mouth at bedtime., Disp: , Rfl:  .   ipratropium-albuterol (DUONEB) 0.5-2.5 (3) MG/3ML SOLN, INHALE CONTENTS OF 1 VIAL THREE TIMES A DAY IN NEBULIZER (Patient taking differently: Take 3 mLs by nebulization 3 (three) times daily. ), Disp: 1080 mL, Rfl: 4 .  lamoTRIgine (LAMICTAL) 25 MG tablet, Take 1 tablet (25 mg total) by mouth 2 (two) times daily., Disp: 60 tablet, Rfl: 1 .  losartan (COZAAR) 50 MG tablet, TAKE 1 TABLET BY MOUTH ONCE DAILY, Disp: 90 tablet, Rfl: 0 .  montelukast (SINGULAIR) 10 MG tablet, TAKE 1 TABLET BY MOUTH AT BEDTIME, Disp: 90 tablet, Rfl: 0 .  Multiple Vitamins-Minerals (CENTRUM SILVER PO), Take 1 tablet by mouth daily. , Disp: , Rfl:  .  ondansetron (ZOFRAN-ODT) 4 MG disintegrating tablet, DISSOLVE 1 TABLET(4 MG) ON THE TONGUE EVERY 8 HOURS AS NEEDED FOR NAUSEA OR VOMITING, Disp: 20 tablet, Rfl: 0 .  pantoprazole (PROTONIX) 40 MG tablet, Take 40 mg by mouth daily., Disp: , Rfl:  .  amLODipine (NORVASC) 2.5 MG tablet, Take 1 tablet (2.5 mg total) by mouth daily. raynauds? (Patient not taking: Reported on 09/09/2018), Disp: 30 tablet, Rfl: 1 .  Blood Glucose Monitoring Suppl (ACCU-CHEK NANO SMARTVIEW) W/DEVICE KIT, Use to check blood sugar 1-2 times daily (Patient taking differently: 1 strip by Other route 2 (two) times a week. ), Disp: 1 kit, Rfl: 0 .  docusate sodium (COLACE) 100 MG capsule, Take 100 mg by mouth at bedtime. , Disp: , Rfl:  .  DULoxetine (CYMBALTA) 30 MG capsule, 3 tabs qd., Disp: 90 capsule, Rfl: 1 .  feeding  supplement, ENSURE ENLIVE, (ENSURE ENLIVE) LIQD, Take 237 mLs by mouth 3 (three) times daily between meals., Disp: 14 Bottle, Rfl: 0 .  glucose blood test strip, Accu-chek nano, test glucose once daily, dx E11.9, Disp: 100 each, Rfl: 12 .  Lancets Misc. (ACCU-CHEK FASTCLIX LANCET) KIT, USE TO CHECK BLOOD SUGAR 1-2 TIMES DAILY Dx E11.9, Disp: 1 kit, Rfl: 1 .  NON FORMULARY, FDgard: Take 1 capsule by mouth at bedtime, Disp: , Rfl:  .  NON FORMULARY, Respivest: Three times a day for 30 minutes,  Disp: , Rfl:  .  Probiotic Product (ALIGN PO), Take 1 capsule by mouth daily with breakfast. , Disp: , Rfl:  .  Respiratory Therapy Supplies (FLUTTER) DEVI, 1 Device by Does not apply route as directed. (Patient taking differently: 1 Device as directed. ), Disp: 1 each, Rfl: 0 .  sodium chloride HYPERTONIC 3 % nebulizer solution, Take by nebulization 2 (two) times daily. (Patient taking differently: Take 4 mLs by nebulization 2 (two) times daily. ), Disp: 750 mL, Rfl: 5 .  umeclidinium-vilanterol (ANORO ELLIPTA) 62.5-25 MCG/INH AEPB, Inhale 1 puff into the lungs daily., Disp: 60 each, Rfl: 5 Medication Side Effects: none  Family Medical/ Social History: Changes? No  MENTAL HEALTH EXAM:  There were no vitals taken for this visit.There is no height or weight on file to calculate BMI.  General Appearance: Casual  Eye Contact:  Good  Speech:  Normal Rate  Volume:  Normal  Mood:  Depressed  Affect:  Appropriate  Thought Process:  Linear  Orientation:  Full (Time, Place, and Person)  Thought Content: Logical   Suicidal Thoughts:  No  Homicidal Thoughts:  No  Memory:  WNL  Judgement:  Good  Insight:  Good  Psychomotor Activity:  Normal  Concentration:  Concentration: Good  Recall:  Good  Fund of Knowledge: Good  Language: Good  Assets:  Desire for Improvement  ADL's:  Intact  Cognition: WNL  Prognosis:  Good    DIAGNOSES:    ICD-10-CM   1. Depression, unspecified depression type F32.9   2. Psychosis, unspecified psychosis type ([place]) F29   3. Anxiety state F41.1     Receiving Psychotherapy: No    RECOMMENDATIONS: Increase her Cymbalta from 60 to 90 mg to help with anxiety and depression.  I will see her again in 1 month.   Comer Locket, PA-C
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
## 2018-09-10 ENCOUNTER — Encounter: Payer: Self-pay | Admitting: Rheumatology

## 2018-09-10 ENCOUNTER — Ambulatory Visit: Payer: Medicare HMO | Admitting: Rheumatology

## 2018-09-10 ENCOUNTER — Ambulatory Visit: Payer: Self-pay

## 2018-09-10 VITALS — BP 104/61 | HR 108 | Resp 14 | Ht 62.0 in | Wt 132.0 lb

## 2018-09-10 DIAGNOSIS — J479 Bronchiectasis, uncomplicated: Secondary | ICD-10-CM | POA: Diagnosis not present

## 2018-09-10 DIAGNOSIS — M19071 Primary osteoarthritis, right ankle and foot: Secondary | ICD-10-CM

## 2018-09-10 DIAGNOSIS — M5030 Other cervical disc degeneration, unspecified cervical region: Secondary | ICD-10-CM

## 2018-09-10 DIAGNOSIS — I7300 Raynaud's syndrome without gangrene: Secondary | ICD-10-CM | POA: Diagnosis not present

## 2018-09-10 DIAGNOSIS — M7541 Impingement syndrome of right shoulder: Secondary | ICD-10-CM

## 2018-09-10 DIAGNOSIS — Z8659 Personal history of other mental and behavioral disorders: Secondary | ICD-10-CM

## 2018-09-10 DIAGNOSIS — R768 Other specified abnormal immunological findings in serum: Secondary | ICD-10-CM

## 2018-09-10 DIAGNOSIS — Z9889 Other specified postprocedural states: Secondary | ICD-10-CM | POA: Diagnosis not present

## 2018-09-10 DIAGNOSIS — Z8639 Personal history of other endocrine, nutritional and metabolic disease: Secondary | ICD-10-CM

## 2018-09-10 DIAGNOSIS — M5136 Other intervertebral disc degeneration, lumbar region: Secondary | ICD-10-CM

## 2018-09-10 DIAGNOSIS — Z8679 Personal history of other diseases of the circulatory system: Secondary | ICD-10-CM

## 2018-09-10 DIAGNOSIS — M170 Bilateral primary osteoarthritis of knee: Secondary | ICD-10-CM

## 2018-09-10 DIAGNOSIS — D721 Eosinophilia, unspecified: Secondary | ICD-10-CM

## 2018-09-10 DIAGNOSIS — M19072 Primary osteoarthritis, left ankle and foot: Secondary | ICD-10-CM

## 2018-09-10 DIAGNOSIS — L639 Alopecia areata, unspecified: Secondary | ICD-10-CM

## 2018-09-10 DIAGNOSIS — M19041 Primary osteoarthritis, right hand: Secondary | ICD-10-CM

## 2018-09-10 DIAGNOSIS — T43505A Adverse effect of unspecified antipsychotics and neuroleptics, initial encounter: Secondary | ICD-10-CM

## 2018-09-10 DIAGNOSIS — E782 Mixed hyperlipidemia: Secondary | ICD-10-CM

## 2018-09-10 DIAGNOSIS — M19042 Primary osteoarthritis, left hand: Secondary | ICD-10-CM

## 2018-09-10 DIAGNOSIS — I10 Essential (primary) hypertension: Secondary | ICD-10-CM

## 2018-09-10 DIAGNOSIS — G2401 Drug induced subacute dyskinesia: Secondary | ICD-10-CM

## 2018-09-10 NOTE — Telephone Encounter (Signed)
Patient called in with c/o "vomiting and diarrhea." She says "it started in September for both of them. I vomited once today and twice yesterday; today green came up. I've had diarrhea once today and 3 times yesterday, brown, liquid with some particles." I asked about keeping fluids down, she says "I drank soda and water today and it came up." I asked has she tried to drink anything else, she denies. I asked about hydration, she says "my lips are white, but my mouth is not dry on the inside. I can't remember, but I think I urinated twice today." I asked about other symptoms, she says "sometimes a little dizzy, but not now." According to protocol, see PCP within 24 hours, appointment scheduled for tomorrow at 1530 with Dr. Fabian Sharp, care advice given, advised to take Zofran for nausea, then wait about an hour and try sipping on clear liquids, advised if unable to keep liquids down after taking Zofran to go to the ED for evaluation, she verbalized understanding.   Reason for Disposition . [1] MILD vomiting with diarrhea AND [2] present > 5 days  Answer Assessment - Initial Assessment Questions 1. VOMITING SEVERITY: "How many times have you vomited in the past 24 hours?"     - MILD:  1 - 2 times/day    - MODERATE: 3 - 5 times/day, decreased oral intake without significant weight loss or symptoms of dehydration    - SEVERE: 6 or more times/day, vomits everything or nearly everything, with significant weight loss, symptoms of dehydration      3 times (1 today, 2 yesterday) 2. ONSET: "When did the vomiting begin?"      September off and on 3. FLUIDS: "What fluids or food have you vomited up today?" "Have you been able to keep any fluids down?"     Soda and water I vomited up green 4. ABDOMINAL PAIN: "Are your having any abdominal pain?" If yes : "How bad is it and what does it feel like?" (e.g., crampy, dull, intermittent, constant)      A little earlier, but not now 5. DIARRHEA: "Is there any diarrhea?"  If so, ask: "How many times today?"      Yes; 1 time today, yesterday 3 times 6. CONTACTS: "Is there anyone else in the family with the same symptoms?"      No 7. CAUSE: "What do you think is causing your vomiting?"      I don't know 8. HYDRATION STATUS: "Any signs of dehydration?" (e.g., dry mouth [not only dry lips], too weak to stand) "When did you last urinate?"     Weak, but using cane lately; not urinating too well, urinated maybe twice today 9. OTHER SYMPTOMS: "Do you have any other symptoms?" (e.g., fever, headache, vertigo, vomiting blood or coffee grounds, recent head injury)     Sometimes dizzy, but not now.  10. PREGNANCY: "Is there any chance you are pregnant?" "When was your last menstrual period?"       No  Protocols used: [facility]

## 2018-09-10 NOTE — Progress Notes (Deleted)
Please help her get  Visit  with   Her gi team dr Loreta Ave  asap    I can see her but dont have much to offer  Except being seen in ED . Or her GI doc since I j saw her  A few days ago

## 2018-09-11 ENCOUNTER — Ambulatory Visit: Payer: Medicare HMO | Admitting: Internal Medicine

## 2018-09-11 NOTE — Telephone Encounter (Signed)
[name], Neta Mends, MD  Cox, Armando Gang, CMA        Please help her get appt with Her GI  specialist Doc Dr Loreta Ave ASAP who is managing this problem . I dont have much to offer except ED care  I just saw her ast week      LMTCB to check pt's status and see if she would like [place] to make her appt w/DrMann

## 2018-09-16 ENCOUNTER — Other Ambulatory Visit: Payer: Self-pay | Admitting: *Deleted

## 2018-09-16 NOTE — Patient Outreach (Signed)
[facility]) Care Management  09/16/2018  ARISBETH PURRINGTON May 26, 1951 [id number]   Call placed to member to follow up on current health status, no answer.  HIPAA compliant voice message left.  Will follow up within the next 4 business days.  Kemper Durie, [place], MSN [facility] Care Management  [facility] Manager [phone]

## 2018-09-17 NOTE — Progress Notes (Signed)
Cardiology Office Note   Date:  09/23/2018   ID:  Desiree [name], DOB [birth year], MRN [id number]  PCP:  [name], MD  Cardiologist:   [name], MD   No chief complaint on file.     History of Present Illness: Desiree [name] is a 67 y.o. female who presents for f/u chest pain and murmur  Referred by Dr [name]. Seen by Dr Montez Morita 1980 diagnosed with MVP CRF;s DM previous smoker quit in 2002 had normal myovue in 2016  Bipolar followed by Desiree [name]   TTE reviewed from 07/29/18 severe LVH  Septum 18 mm EF 60-65% grade one diastolic mild MR and no SAM or LVOT gradient   She has totally atypical chest pain Sharp fleeting non exertional for months Both right And left sided. This is not angina   Denies palpitations dyspnea or syncope She does get some dizzy spells   Hospitalized for pneumonia twice this year Sees [facility] for bronchiectasis and COPD  Being w/u  By rheumatology for Raynauds and had negative recent lab work including Lupus Anticoagulant  ANA and cardiolipin   Daughter had metastatic breast cancer and died 11-12-2017  Past Medical History:  Diagnosis Date  . Anxiety   . Bipolar disorder ([place])   . CANDIDIASIS, ESOPHAGEAL 07/28/2009   Qualifier: Diagnosis of  By: [name] MD, [name]   . Chronic kidney disease    CKD III  . COPD (chronic obstructive pulmonary disease) ([place])   . Depression   . Diabetes mellitus without complication (HCC)    no meds  . FH: colonic polyps   . Fractured elbow    right   . GERD (gastroesophageal reflux disease)   . HH (hiatus hernia)   . History of carpal tunnel syndrome   . History of chest pain   . History of transfusion of packed red blood cells   . Hyperlipidemia   . Hypertension   . Neuroleptic-induced tardive dyskinesia   . Osteoarthritis of more than one site   . Seasonal allergies     Past Surgical History:  Procedure Laterality Date  . ANTERIOR CERVICAL DECOMP/DISCECTOMY FUSION  09/27/2016   C5-6  anterior cervical discectomy and fusion, allograft and plate/notes 09/27/2016  . ANTERIOR CERVICAL DECOMP/DISCECTOMY FUSION N/A 09/27/2016   Procedure: C5-6 Anterior Cervical Discectomy and Fusion, Allograft and Plate;  Surgeon: [name], MD;  Location: [facility];  Service: Orthopedics;  Laterality: N/A;  . Back Fusion  2002  . BIOPSY  07/19/2018   Procedure: BIOPSY;  Surgeon: [name], MD;  Location: [facility];  Service: Endoscopy;;  . CARPAL TUNNEL RELEASE  yates   left  . COLONOSCOPY N/A 01/05/2014   Procedure: COLONOSCOPY;  Surgeon: [name], MD;  Location: [facility];  Service: Endoscopy;  Laterality: N/A;  . ELBOW SURGERY     age 23  . ESOPHAGOGASTRODUODENOSCOPY (EGD) WITH PROPOFOL N/A 07/19/2018   Procedure: ESOPHAGOGASTRODUODENOSCOPY (EGD) WITH PROPOFOL;  Surgeon: [name], MD;  Location: [facility];  Service: Endoscopy;  Laterality: N/A;  . EXTERNAL EAR SURGERY Left   . EYE SURGERY     "removed white dots under eyelid"  . FINGER SURGERY Left   . Juvara osteomy    . KNEE SURGERY    . NOSE SURGERY    . Rt. toe bunion    . skin, shave biopsy  05/03/2016   Left occipital scalp, top of scalp     Current Outpatient Medications  Medication Sig Dispense  Refill  . acetaminophen (TYLENOL) 500 MG tablet Take 2 tablets (1,000 mg total) by mouth every 6 (six) hours as needed. (Patient taking differently: Take 1,000 mg by mouth every 6 (six) hours as needed for mild pain or headache. ) 30 tablet 0  . amLODipine (NORVASC) 2.5 MG tablet Take 1 tablet (2.5 mg total) by mouth daily. raynauds? 30 tablet 1  . atorvastatin (LIPITOR) 20 MG tablet TAKE 1 TABLET BY MOUTH ONCE DAILY 90 tablet 0  . Blood Glucose Monitoring Suppl (ACCU-CHEK NANO SMARTVIEW) W/DEVICE KIT Use to check blood sugar 1-2 times daily (Patient taking differently: 1 strip by Other route 2 (two) times a week. ) 1 kit 0  . cetirizine (ZYRTEC) 10 MG tablet Take 10 mg by mouth at bedtime.     . diclofenac sodium  (VOLTAREN) 1 % GEL Apply 3 grams to 3 large joints, up to 3 times daily. (Patient taking differently: Apply 3 g topically See admin instructions. Apply 3 grams to 3 large joints, up to 3 times daily) 3 Tube 3  . docusate sodium (COLACE) 100 MG capsule Take 100 mg by mouth at bedtime.     . DULoxetine (CYMBALTA) 30 MG capsule 3 tabs qd. 90 capsule 1  . feeding supplement, ENSURE ENLIVE, (ENSURE ENLIVE) LIQD Take 237 mLs by mouth 3 (three) times daily between meals. 14 Bottle 0  . fluticasone (FLONASE) 50 MCG/ACT nasal spray Place 1-2 sprays into both nostrils at bedtime.     Marland Kitchen glucose blood test strip Accu-chek nano, test glucose once daily, dx E11.9 100 each 12  . guaiFENesin (MUCINEX) 600 MG 12 hr tablet Take 600 mg by mouth at bedtime.    Marland Kitchen ipratropium-albuterol (DUONEB) 0.5-2.5 (3) MG/3ML SOLN INHALE CONTENTS OF 1 VIAL THREE TIMES A DAY IN NEBULIZER (Patient taking differently: Take 3 mLs by nebulization 3 (three) times daily. ) 1080 mL 4  . lamoTRIgine (LAMICTAL) 25 MG tablet Take 1 tablet (25 mg total) by mouth 2 (two) times daily. 60 tablet 1  . Lancets Misc. (ACCU-CHEK FASTCLIX LANCET) KIT USE TO CHECK BLOOD SUGAR 1-2 TIMES DAILY Dx E11.9 1 kit 1  . losartan (COZAAR) 50 MG tablet TAKE 1 TABLET BY MOUTH ONCE DAILY 90 tablet 0  . montelukast (SINGULAIR) 10 MG tablet TAKE 1 TABLET BY MOUTH AT BEDTIME 90 tablet 0  . Multiple Vitamins-Minerals (CENTRUM SILVER PO) Take 1 tablet by mouth daily.     Desiree [name] FORMULARY FDgard: Take 1 capsule by mouth at bedtime    . NON FORMULARY Respivest: Three times a day for 30 minutes    . ondansetron (ZOFRAN-ODT) 4 MG disintegrating tablet DISSOLVE 1 TABLET(4 MG) ON THE TONGUE EVERY 8 HOURS AS NEEDED FOR NAUSEA OR VOMITING 20 tablet 0  . pantoprazole (PROTONIX) 40 MG tablet Take 40 mg by mouth daily.    . Probiotic Product (ALIGN PO) Take 1 capsule by mouth daily with breakfast.     . Respiratory Therapy Supplies (FLUTTER) DEVI 1 Device by Does not apply route as  directed. (Patient taking differently: 1 Device as directed. ) 1 each 0  . sodium chloride HYPERTONIC 3 % nebulizer solution Take by nebulization 2 (two) times daily. (Patient taking differently: Take 4 mLs by nebulization 2 (two) times daily. ) 750 mL 5  . umeclidinium-vilanterol (ANORO ELLIPTA) 62.5-25 MCG/INH AEPB Inhale 1 puff into the lungs daily. 60 each 5   No current facility-administered medications for this visit.     Allergies:   Prednisone; Solu-medrol [  methylprednisolone]; Amoxicillin; Codeine; Hydrocodone-acetaminophen; and Penicillins    Social History:  The patient  reports that she quit smoking about 16 years ago. Her smoking use included cigarettes. She has a 60.00 pack-year smoking history. She has never used smokeless tobacco. She reports that she does not drink alcohol or use drugs.   Family History:  The patient's family history includes Breast cancer in her cousin; Diabetes in her mother; Diabetes type II in her brother; Heart attack in her father and mother; Heart disease in her brother and father; Hypertension in her mother; Lung cancer in her brother, daughter, and paternal uncle; Throat cancer in her brother.    ROS:  Please see the history of present illness.   Otherwise, review of systems are positive for none.   All other systems are reviewed and negative.    PHYSICAL EXAM: VS:  BP 128/68   Pulse 62   Ht _0  (1.575 m)   Wt 133 lb 3.2 oz (60.4 kg)   BMI 24.36 kg/m  , BMI Body mass index is 24.36 kg/m. Affect appropriate Healthy:  appears stated age 60: normal Neck supple with no adenopathy JVP normal no bruits no thyromegaly Lungs clear with no wheezing and good diaphragmatic motion Heart:  S1/S2  Soft SEM not increased with valsalva , no rub, gallop or click PMI normal Abdomen: benighn, BS positve, no tenderness, no AAA no bruit.  No HSM or HJR Distal pulses intact with no bruits No edema Neuro non-focal Skin warm and dry No muscular  weakness    EKG:  12/06/16 SR LAD borderline LVH    Recent Labs: 07/20/2018: Magnesium 1.7 08/06/2018: TSH 3.398 08/07/2018: ALT 23 08/08/2018: BUN 19; Creatinine, Ser 1.11; Potassium 4.1; Sodium 141 08/14/2018: Hemoglobin 11.7; Platelet Count 447    Lipid Panel    Component Value Date/Time   CHOL 168 08/07/2017 1527   TRIG 74.0 08/07/2017 1527   HDL 64.50 08/07/2017 1527   CHOLHDL 3 08/07/2017 1527   VLDL 14.8 08/07/2017 1527   LDLCALC 89 08/07/2017 1527   LDLDIRECT 146.3 01/26/2010 0000      Wt Readings from Last 3 Encounters:  09/23/18 133 lb 3.2 oz (60.4 kg)  09/10/18 132 lb (59.9 kg)  09/06/18 135 lb 11.2 oz (61.6 kg)      Other studies Reviewed: Additional studies/ records that were reviewed today include: Notes Dr Erin Hearing and brackbill 2016 myovue 2016 recent echo Notes primary care .    ASSESSMENT AND PLAN:  1.  Abnormal Echo:  Severe LVH previously described small LVOT gradient  No murmur on exam  Avoid dehydration consider f/u echo for new symptoms can consider MRI 2. Chest pain non cardiac no need for stress testing at this time  3. Bipolar seem compensated and functional  4. DM Discussed low carb diet.  Target hemoglobin A1c is 6.5 or less.  Continue current medications. 5. HtN:  Well controlled.  Continue current medications and low sodium Dash type diet.   6. Pneumonia:  Concern for eosinophilia and elevated WBC improved f/u Dr [place] Bells history of bronchiectasis and COPD   Current medicines are reviewed at length with the patient today.  The patient does not have concerns regarding medicines.  The following changes have been made:  none  Labs/ tests ordered today include: none  No orders of the defined types were placed in this encounter.    Disposition:   FU with cardiology in a year      Signed, [name], MD  09/23/2018 4:25 PM    [facility] [address], [place], [place]  [address] Phone: [phone];  Fax: [phone]

## 2018-09-18 ENCOUNTER — Ambulatory Visit: Payer: Medicare HMO | Admitting: Pulmonary Disease

## 2018-09-18 ENCOUNTER — Other Ambulatory Visit: Payer: Self-pay | Admitting: Pharmacy Technician

## 2018-09-18 LAB — RNP ANTIBODY: Ribonucleic Protein(ENA) Antibody, IgG: 1.2 AI — AB

## 2018-09-18 LAB — C3 AND C4
C3 Complement: 96 mg/dL (ref 83–193)
C4 Complement: 30 mg/dL (ref 15–57)

## 2018-09-18 LAB — ANTI-SMITH ANTIBODY: ENA SM Ab Ser-aCnc: <1 AI

## 2018-09-18 LAB — CRYOGLOBULIN: Cryoglobulin, Qualitative Analysis: NOT DETECTED

## 2018-09-18 LAB — CARDIOLIPIN ANTIBODIES, IGG, IGM, IGA
Anticardiolipin IgA: <11 [APL'U]
Anticardiolipin IgG: <14 [GPL'U]
Anticardiolipin IgM: <12 [MPL'U]

## 2018-09-18 LAB — LUPUS ANTICOAGULANT EVAL W/ REFLEX
PTT-LA Screen: 40 s (ref <=40)
dRVVT: 37 s (ref <=45)

## 2018-09-18 LAB — BETA-2 GLYCOPROTEIN ANTIBODIES
Beta-2 Glyco 1 IgA: <9 SAU (ref <=20)
Beta-2 Glyco 1 IgM: <9 SMU (ref <=20)
Beta-2 Glyco I IgG: <9 SGU (ref <=20)

## 2018-09-18 LAB — SJOGRENS SYNDROME-B EXTRACTABLE NUCLEAR ANTIBODY: SSB (La) (ENA) Antibody, IgG: <1 AI

## 2018-09-18 LAB — ANTI-DNA ANTIBODY, DOUBLE-STRANDED: ds DNA Ab: 1 IU/mL

## 2018-09-18 LAB — ANA: Anti Nuclear Antibody(ANA): NEGATIVE

## 2018-09-18 LAB — SJOGRENS SYNDROME-A EXTRACTABLE NUCLEAR ANTIBODY: SSA (Ro) (ENA) Antibody, IgG: <1 AI

## 2018-09-18 LAB — ANTI-SCLERODERMA ANTIBODY: Scleroderma (Scl-70) (ENA) Antibody, IgG: <1 AI

## 2018-09-18 LAB — CK: Total CK: 28 U/L — ABNORMAL LOW (ref 29–143)

## 2018-09-18 NOTE — Progress Notes (Signed)
Will discuss at follow up visit

## 2018-09-18 NOTE — Patient Outreach (Signed)
[facility]) Care Management  09/18/2018  Desiree [name] 03/27/1951 [id number]   Unsuccessful call to verify patient received Anoro Ellipta from [employer].  Will make 2nd call attempt in 3-4 business days.  [name] Effie Shy CPhT Certified Pharmacy Technician [facility] Care Management Direct Dial:[phone]

## 2018-09-23 ENCOUNTER — Ambulatory Visit (INDEPENDENT_AMBULATORY_CARE_PROVIDER_SITE_OTHER): Payer: Medicare HMO | Admitting: Cardiovascular Disease

## 2018-09-23 ENCOUNTER — Other Ambulatory Visit: Payer: Self-pay | Admitting: *Deleted

## 2018-09-23 ENCOUNTER — Encounter: Payer: Self-pay | Admitting: Cardiovascular Disease

## 2018-09-23 VITALS — BP 128/68 | HR 62 | Ht 62.0 in | Wt 133.2 lb

## 2018-09-23 DIAGNOSIS — I10 Essential (primary) hypertension: Secondary | ICD-10-CM | POA: Diagnosis not present

## 2018-09-23 NOTE — Patient Outreach (Signed)
[facility]) Care Management  09/23/2018  Desiree [name] 1951-01-07 [id number]   Call placed to member to follow up on current health status.  State she has been having dizzy episodes due to low blood pressure, fell yesterday.  Denies loss of consciousness or injury.  Report most recent BP was 106/50.  Has not been recording readings to share with MD, advised to do so going forward.  Has appointment with cardiologist this afternoon to review medications and check blood pressure.  Appointment with pulmonologist on 12/17, denies any current shortness of breath.  Will follow up within the next month.  [facility] CM Care Plan Problem One     Most Recent Value  Care Plan Problem One  Risk for readmission related to respiratory distress as evidenced by recent hospitalization for pneumonia  Role Documenting the Problem One  Care Management [place] for Problem One  Active  [facility] Long Term Goal   Member will not be readmitted to hospital within 31 days of discharge  [facility] Long Term Goal Start Date  07/24/18  [facility] Long Term Goal Met Date  09/23/18  [facility] CM Short Term Goal #1   Member will check and record blood pressure readings daily over the next 3 weeks  [facility] CM Short Term Goal #1 Start Date  09/23/18  Inter[facility] for Short Term Goal #1  Educated on complications of hypotension, advised of importance of documenting trends for MD review     Valente David, Therapist, sports, MSN [place] Manager [phone]

## 2018-09-23 NOTE — Patient Instructions (Signed)
Medication Instructions:   If you need a refill on your cardiac medications before your next appointment, please call your pharmacy.   Lab work:  If you have labs (blood work) drawn today and your tests are completely normal, you will receive your results only by: . MyChart Message (if you have MyChart) OR . A paper copy in the mail If you have any lab test that is abnormal or we need to change your treatment, we will call you to review the results.  Testing/Procedures: NONE ordered.   Follow-Up: At [facility], you and your health needs are our priority.  As part of our continuing mission to provide you with exceptional heart care, we have created designated Provider Care Teams.  These Care Teams include your primary Cardiologist (physician) and Advanced Practice Providers (APPs -  Physician Assistants and Nurse Practitioners) who all work together to provide you with the care you need, when you need it. Your physician recommends that you schedule a follow-up appointment as needed with Dr. Nishan.    

## 2018-09-25 ENCOUNTER — Ambulatory Visit: Payer: Medicare HMO | Admitting: Pulmonary Disease
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
## 2018-09-28 DIAGNOSIS — J479 Bronchiectasis, uncomplicated: Secondary | ICD-10-CM | POA: Diagnosis not present

## 2018-09-30 ENCOUNTER — Other Ambulatory Visit: Payer: Self-pay | Admitting: Internal Medicine

## 2018-09-30 ENCOUNTER — Emergency Department (HOSPITAL_COMMUNITY)
Admission: EM | Admit: 2018-09-30 | Discharge: 2018-09-30 | Disposition: A | Discharge status: Home / Self Care | Payer: Medicare HMO | Attending: Emergency Medicine | Admitting: Emergency Medicine

## 2018-09-30 ENCOUNTER — Encounter (HOSPITAL_COMMUNITY): Payer: Self-pay | Admitting: Emergency Medicine

## 2018-09-30 DIAGNOSIS — I7300 Raynaud's syndrome without gangrene: Secondary | ICD-10-CM | POA: Insufficient documentation

## 2018-09-30 DIAGNOSIS — F319 Bipolar disorder, unspecified: Secondary | ICD-10-CM | POA: Insufficient documentation

## 2018-09-30 DIAGNOSIS — Z79891 Long term (current) use of opiate analgesic: Secondary | ICD-10-CM | POA: Insufficient documentation

## 2018-09-30 DIAGNOSIS — J449 Chronic obstructive pulmonary disease, unspecified: Secondary | ICD-10-CM | POA: Insufficient documentation

## 2018-09-30 DIAGNOSIS — N183 Chronic kidney disease, stage 3 (moderate): Secondary | ICD-10-CM | POA: Insufficient documentation

## 2018-09-30 DIAGNOSIS — M79642 Pain in left hand: Secondary | ICD-10-CM | POA: Insufficient documentation

## 2018-09-30 DIAGNOSIS — E119 Type 2 diabetes mellitus without complications: Secondary | ICD-10-CM | POA: Diagnosis not present

## 2018-09-30 DIAGNOSIS — I129 Hypertensive chronic kidney disease with stage 1 through stage 4 chronic kidney disease, or unspecified chronic kidney disease: Secondary | ICD-10-CM | POA: Diagnosis not present

## 2018-09-30 DIAGNOSIS — M79641 Pain in right hand: Secondary | ICD-10-CM | POA: Diagnosis present

## 2018-09-30 MED ORDER — ACETAMINOPHEN 325 MG PO TABS
650.0000 mg | ORAL_TABLET | Freq: Once | ORAL | Status: AC
  Administered 2018-09-30: 650 mg via ORAL
  Filled 2018-09-30: qty 2

## 2018-09-30 MED ORDER — FENTANYL CITRATE (PF) 100 MCG/2ML IJ SOLN
50.0000 ug | Freq: Once | INTRAMUSCULAR | Status: AC
  Administered 2018-09-30: 50 ug via SUBCUTANEOUS
  Filled 2018-09-30: qty 2

## 2018-09-30 NOTE — Discharge Instructions (Addendum)
You were evaluated in the Emergency Department and after careful evaluation, we did not find any emergent condition requiring admission or further testing in the hospital.  Your symptoms today seem to be due to Raynaud's phenomenon.  Your exam greatly improved with warming of the hands.  Your current doctors are working up the underlying reason for this condition, it is important that you follow-up closely.  It is important to avoid trauma to the hands and it is also important to avoid cold.  Please return to the Emergency Department if you experience any worsening of your condition.  We encourage you to follow up with a primary care provider.  Thank you for allowing [place] to be a part of your care.

## 2018-09-30 NOTE — ED Triage Notes (Signed)
Pt has history of raynaud's disease, and on Saturday she had artifical nails removed and noticed bluish nail beds and sore on bilateral middle fingers. Was informed by primary MD to come to ED.
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
## 2018-10-01 ENCOUNTER — Other Ambulatory Visit: Payer: Self-pay | Admitting: *Deleted

## 2018-10-01 NOTE — Patient Outreach (Signed)
[facility]) Care Management  10/01/2018  Desiree [name] [birth year] [id number]   Noted that member presented to ED with Raynaud's, discharged back home.  Call placed to member to follow up on ED visit.  She report she is still having having some pain and her fingers are still cold and turning blue, but she is using interventions provided to cope (wearing gloves, keeping them warm).  She has appointment with rheumatoid specialist on 12/20 and with primary MD on 12/23.  Denies any urgent concerns at this time, advised to contact this care manager with questions.  Will follow up within the next 3 weeks.  Kemper Durie, [place], MSN [facility] Care Management  [facility] Manager [phone]

## 2018-10-05 DIAGNOSIS — R269 Unspecified abnormalities of gait and mobility: Secondary | ICD-10-CM | POA: Diagnosis not present

## 2018-10-05 DIAGNOSIS — J479 Bronchiectasis, uncomplicated: Secondary | ICD-10-CM | POA: Diagnosis not present

## 2018-10-08 ENCOUNTER — Encounter: Payer: Self-pay | Admitting: Rheumatology

## 2018-10-08 ENCOUNTER — Telehealth: Payer: Self-pay | Admitting: Rheumatology

## 2018-10-08 ENCOUNTER — Other Ambulatory Visit: Payer: Self-pay | Admitting: Rheumatology

## 2018-10-08 ENCOUNTER — Ambulatory Visit: Payer: Medicare HMO | Admitting: Psychiatry

## 2018-10-08 ENCOUNTER — Encounter: Payer: Self-pay | Admitting: Pulmonary Disease

## 2018-10-08 ENCOUNTER — Ambulatory Visit: Payer: Medicare HMO | Admitting: Pulmonary Disease

## 2018-10-08 ENCOUNTER — Other Ambulatory Visit: Payer: Self-pay | Admitting: Pharmacy Technician

## 2018-10-08 ENCOUNTER — Ambulatory Visit: Payer: Medicare HMO | Admitting: Rheumatology

## 2018-10-08 VITALS — BP 114/66 | HR 64 | Ht 62.0 in | Wt 128.0 lb

## 2018-10-08 VITALS — BP 115/70 | HR 92 | Resp 13 | Ht 62.0 in | Wt 129.0 lb

## 2018-10-08 DIAGNOSIS — L639 Alopecia areata, unspecified: Secondary | ICD-10-CM

## 2018-10-08 DIAGNOSIS — J301 Allergic rhinitis due to pollen: Secondary | ICD-10-CM

## 2018-10-08 DIAGNOSIS — D721 Eosinophilia, unspecified: Secondary | ICD-10-CM

## 2018-10-08 DIAGNOSIS — I7301 Raynaud's syndrome with gangrene: Secondary | ICD-10-CM | POA: Diagnosis not present

## 2018-10-08 DIAGNOSIS — M5136 Other intervertebral disc degeneration, lumbar region: Secondary | ICD-10-CM

## 2018-10-08 DIAGNOSIS — G2401 Drug induced subacute dyskinesia: Secondary | ICD-10-CM

## 2018-10-08 DIAGNOSIS — J479 Bronchiectasis, uncomplicated: Secondary | ICD-10-CM

## 2018-10-08 DIAGNOSIS — J432 Centrilobular emphysema: Secondary | ICD-10-CM | POA: Diagnosis not present

## 2018-10-08 DIAGNOSIS — M19041 Primary osteoarthritis, right hand: Secondary | ICD-10-CM | POA: Diagnosis not present

## 2018-10-08 DIAGNOSIS — Z9889 Other specified postprocedural states: Secondary | ICD-10-CM

## 2018-10-08 DIAGNOSIS — M7541 Impingement syndrome of right shoulder: Secondary | ICD-10-CM

## 2018-10-08 DIAGNOSIS — Z79899 Other long term (current) drug therapy: Secondary | ICD-10-CM

## 2018-10-08 DIAGNOSIS — E782 Mixed hyperlipidemia: Secondary | ICD-10-CM

## 2018-10-08 DIAGNOSIS — M19042 Primary osteoarthritis, left hand: Secondary | ICD-10-CM

## 2018-10-08 DIAGNOSIS — D72829 Elevated white blood cell count, unspecified: Secondary | ICD-10-CM

## 2018-10-08 DIAGNOSIS — M19071 Primary osteoarthritis, right ankle and foot: Secondary | ICD-10-CM | POA: Diagnosis not present

## 2018-10-08 DIAGNOSIS — Z8659 Personal history of other mental and behavioral disorders: Secondary | ICD-10-CM

## 2018-10-08 DIAGNOSIS — I10 Essential (primary) hypertension: Secondary | ICD-10-CM

## 2018-10-08 DIAGNOSIS — Z8639 Personal history of other endocrine, nutritional and metabolic disease: Secondary | ICD-10-CM

## 2018-10-08 DIAGNOSIS — R768 Other specified abnormal immunological findings in serum: Secondary | ICD-10-CM

## 2018-10-08 DIAGNOSIS — M170 Bilateral primary osteoarthritis of knee: Secondary | ICD-10-CM

## 2018-10-08 DIAGNOSIS — M5030 Other cervical disc degeneration, unspecified cervical region: Secondary | ICD-10-CM | POA: Diagnosis not present

## 2018-10-08 DIAGNOSIS — I7300 Raynaud's syndrome without gangrene: Secondary | ICD-10-CM | POA: Diagnosis not present

## 2018-10-08 DIAGNOSIS — J449 Chronic obstructive pulmonary disease, unspecified: Secondary | ICD-10-CM | POA: Diagnosis not present

## 2018-10-08 DIAGNOSIS — T43505A Adverse effect of unspecified antipsychotics and neuroleptics, initial encounter: Secondary | ICD-10-CM

## 2018-10-08 DIAGNOSIS — Z8679 Personal history of other diseases of the circulatory system: Secondary | ICD-10-CM

## 2018-10-08 DIAGNOSIS — M19072 Primary osteoarthritis, left ankle and foot: Secondary | ICD-10-CM

## 2018-10-08 MED ORDER — AMBRISENTAN 5 MG PO TABS: 5.0000 mg | ORAL_TABLET | Freq: Every day | ORAL | 0 refills | Status: DC

## 2018-10-08 MED ORDER — NITROGLYCERIN 2 % TD OINT: TOPICAL_OINTMENT | TRANSDERMAL | 0 refills | Status: DC

## 2018-10-08 MED ORDER — HYDROXYCHLOROQUINE SULFATE 200 MG PO TABS: ORAL_TABLET | ORAL | 0 refills | Status: DC

## 2018-10-08 NOTE — Patient Instructions (Addendum)
Standing Labs We placed an order today for your standing lab work.    Please come back and get your standing labs in 1 month, then 3 months, then every 5 months.  We have open lab Monday through Friday from 8:30-11:30 AM and 1:30-4:00 PM  at the office of Dr. [name].   You may experience shorter wait times on Monday and Friday afternoons. The office is located at [address], [place], [place] [address] No appointment is necessary.   Labs are drawn by [facility].  You may receive a bill from [facility] for your lab work.  If you wish to have your labs drawn at another location, please call the office 24 hours in advance to send orders.  If you have any questions regarding directions or hours of operation,  please call [phone].   Just as a reminder please drink plenty of water prior to coming for your lab work. Thanks!  Hydroxychloroquine tablets What is this medicine? HYDROXYCHLOROQUINE (hye drox ee KLOR oh kwin) is used to treat rheumatoid arthritis and systemic lupus erythematosus. It is also used to treat malaria. This medicine may be used for other purposes; ask your health care provider or pharmacist if you have questions. COMMON BRAND NAME(S): Plaquenil, Quineprox What should I tell my health care provider before I take this medicine? They need to know if you have any of these conditions: -diabetes -eye disease, vision problems -G6PD deficiency -history of blood diseases -history of irregular heartbeat -if you often drink alcohol -kidney disease -liver disease -porphyria -psoriasis -seizures -an unusual or allergic reaction to chloroquine, hydroxychloroquine, other medicines, foods, dyes, or preservatives -pregnant or trying to get pregnant -breast-feeding How should I use this medicine? Take this medicine by mouth with a glass of water. Follow the directions on the prescription label. Avoid taking antacids within 4 hours of taking this medicine. It  is best to separate these medicines by at least 4 hours. Do not cut, crush or chew this medicine. You can take it with or without food. If it upsets your stomach, take it with food. Take your medicine at regular intervals. Do not take your medicine more often than directed. Take all of your medicine as directed even if you think you are better. Do not skip doses or stop your medicine early. Talk to your pediatrician regarding the use of this medicine in children. While this drug may be prescribed for selected conditions, precautions do apply. Overdosage: If you think you have taken too much of this medicine contact a poison control center or emergency room at once. NOTE: This medicine is only for you. Do not share this medicine with others. What if I miss a dose? If you miss a dose, take it as soon as you can. If it is almost time for your next dose, take only that dose. Do not take double or extra doses. What may interact with this medicine? Do not take this medicine with any of the following medications: -cisapride -dofetilide -dronedarone -live virus vaccines -penicillamine -pimozide -thioridazine -ziprasidone This medicine may also interact with the following medications: -ampicillin -antacids -cimetidine -cyclosporine -digoxin -medicines for diabetes, like insulin, glipizide, glyburide -medicines for seizures like carbamazepine, phenobarbital, phenytoin -mefloquine -methotrexate -other medicines that prolong the QT interval (cause an abnormal heart rhythm) -praziquantel This list may not describe all possible interactions. Give your health care provider a list of all the medicines, herbs, non-prescription drugs, or dietary supplements you use. Also tell them if you smoke, drink  alcohol, or use illegal drugs. Some items may interact with your medicine. What should I watch for while using this medicine? Tell your doctor or healthcare professional if your symptoms do not start to get  better or if they get worse. Avoid taking antacids within 4 hours of taking this medicine. It is best to separate these medicines by at least 4 hours. Tell your doctor or health care professional right away if you have any change in your eyesight. Your vision and blood may be tested before and during use of this medicine. This medicine can make you more sensitive to the sun. Keep out of the sun. If you cannot avoid being in the sun, wear protective clothing and use sunscreen. Do not use sun lamps or tanning beds/booths. What side effects may I notice from receiving this medicine? Side effects that you should report to your doctor or health care professional as soon as possible: -allergic reactions like skin rash, itching or hives, swelling of the face, lips, or tongue -changes in vision -decreased hearing or ringing of the ears -redness, blistering, peeling or loosening of the skin, including inside the mouth -seizures -sensitivity to light -signs and symptoms of a dangerous change in heartbeat or heart rhythm like chest pain; dizziness; fast or irregular heartbeat; palpitations; feeling faint or lightheaded, falls; breathing problems -signs and symptoms of liver injury like dark yellow or brown urine; general ill feeling or flu-like symptoms; light-colored stools; loss of appetite; nausea; right upper belly pain; unusually weak or tired; yellowing of the eyes or skin -signs and symptoms of low blood sugar such as feeling anxious; confusion; dizziness; increased hunger; unusually weak or tired; sweating; shakiness; cold; irritable; headache; blurred vision; fast heartbeat; loss of consciousness -uncontrollable head, mouth, neck, arm, or leg movements Side effects that usually do not require medical attention (report to your doctor or health care professional if they continue or are bothersome): -anxious -diarrhea -dizziness -hair loss -headache -irritable -loss of appetite -nausea,  vomiting -stomach pain This list may not describe all possible side effects. Call your doctor for medical advice about side effects. You may report side effects to FDA at 1-800-FDA-1088. Where should I keep my medicine? Keep out of the reach of children. In children, this medicine can cause overdose with small doses. Store at room temperature between 15 and 30 degrees C (59 and 86 degrees F). Protect from moisture and light. Throw away any unused medicine after the expiration date. NOTE: This sheet is a summary. It may not cover all possible information. If you have questions about this medicine, talk to your doctor, pharmacist, or health care provider.  2018 Elsevier/Gold Standard (2016-05-24 14:16:15)  Nitroglycerin skin ointment What is this medicine? NITROGLYCERIN (nye troe GLI ser in) is a type of vasodilator. It relaxes blood vessels, increasing the blood and oxygen supply to your heart. This medicine is used to prevent chest pain caused by angina. It should not be used for immediate relief during an angina attack. This medicine may be used for other purposes; ask your health care provider or pharmacist if you have questions. COMMON BRAND NAME(S): Nitro Bid, Nitrol What should I tell my health care provider before I take this medicine? They need to know if you have any of these conditions: -anemia -head injury, recent stroke, or bleeding in the brain -liver disease -previous heart attack -an unusual or allergic reaction to nitroglycerin, other medicines, foods, dyes, or preservatives -pregnant or trying to get pregnant -breast-feeding How should I use  this medicine? This medicine is for external use only. Do not take by mouth. Follow the directions on the prescription label. Use exactly as directed. Use one of the ruled papers that come with the ointment to accurately measure the dose. Using the paper, spread a thin layer of ointment about the size of the paper over a clean, dry area  of skin on the upper body where there is little or no hair. Avoid injured, irritated, calloused, or scarred areas. Do not rub the ointment into the skin. Tape the paper in place over the ointment to protect your clothing. You can cover the paper with plastic wrap or a plastic bandage. Try not to get the ointment on your fingers. Wash your hands well after use. Remove the paper and clean the area before applying the next dose. Use a different site each day to prevent skin irritation. Do not use your medicine more often than directed. Do not stop using except on the advice of your doctor or health care professional. Talk to your pediatrician regarding the use of this medicine in children. Special care may be needed. Overdosage: If you think you have taken too much of this medicine contact a poison control center or emergency room at once. NOTE: This medicine is only for you. Do not share this medicine with others. What if I miss a dose? If you miss a dose, use it as soon as you can. If it is almost time for your next dose, use only that dose. Do not use double or extra doses. What may interact with this medicine? Do not take this medicine with any of the following medications: -certain migraine medicines like ergotamine and dihydroergotamine (DHE) -medicines used to treat erectile dysfunction like sildenafil, tadalafil, and vardenafil -riociguat This medicine may also interact with the following medications: -alteplase -aspirin -heparin -medicines for high blood pressure -medicines for mental depression -other medicines used to treat angina -phenothiazines like chlorpromazine, mesoridazine, prochlorperazine, thioridazine This list may not describe all possible interactions. Give your health care provider a list of all the medicines, herbs, non-prescription drugs, or dietary supplements you use. Also tell them if you smoke, drink alcohol, or use illegal drugs. Some items may interact with your  medicine. What should I watch for while using this medicine? Tell your doctor or health care professional if you feel your medicine is no longer working. You may get drowsy or dizzy. Do not drive, use machinery, or do anything that needs mental alertness until you know how this drug affects you. Do not stand or sit up quickly, especially if you are an older patient. This reduces the risk of dizzy or fainting spells. Alcohol can make you more drowsy and dizzy. Avoid alcoholic drinks. Do not treat yourself for coughs, colds, or pain while you are taking this medicine without asking your doctor or health care professional for advice. Some ingredients may increase your blood pressure. What side effects may I notice from receiving this medicine? Side effects that you should report to your doctor or health care professional as soon as possible: -allergic reactions like skin rash, itching or hives, swelling of the face, lips, or tongue -blurred vision -dry mouth -skin irritation from ointment -sweating -the feeling of extreme pressure in the head -unusually weak or tired Side effects that usually do not require medical attention (report to your doctor or health care professional if they continue or are bothersome): -flushing of the face or neck -headache -irregular heartbeat, palpitations -nausea, vomiting This list  may not describe all possible side effects. Call your doctor for medical advice about side effects. You may report side effects to FDA at 1-800-FDA-1088. Where should I keep my medicine? Keep out of the reach of children. Store at room temperature between 15 and 30 degrees C (59 and 86 degrees F). Close tightly after each use. Throw away any unused medicine after the expiration date. NOTE: This sheet is a summary. It may not cover all possible information. If you have questions about this medicine, talk to your doctor, pharmacist, or health care provider.  2018 Elsevier/Gold Standard  (2013-07-31 10:26:44) Ambrisentan oral tablets What is this medicine? AMBRISENTAN (am bri SEN tan) is used to treat pulmonary hypertension. This medicine may help make exercise and breathing easier. This medicine may be used for other purposes; ask your health care provider or pharmacist if you have questions. COMMON BRAND NAME(S): LETAIRIS What should I tell my health care provider before I take this medicine? They need to know if you have any of these conditions: -anemia -liver disease -pulmonary veno-occlusive disease (PVOD) -an unusual or allergic reaction to ambrisentan, other medicines, foods, dyes, or preservatives -pregnant or trying to get pregnant -breast-feeding How should I use this medicine? Take this medicine by mouth with a glass of water. Follow the directions on the prescription label. You can take this medicine with or without food. Do not cut, crush or chew this medicine. Take your medicine at regular intervals. Do not take it more often than directed. Do not stop taking except on your doctor's advice. A special MedGuide will be given to you by the pharmacist with each prescription and refill. Be sure to read this information carefully each time. Talk to your pediatrician regarding the use of this medicine in children. Special care may be needed. Overdosage: If you think you have taken too much of this medicine contact a poison control center or emergency room at once. NOTE: This medicine is only for you. Do not share this medicine with others. What if I miss a dose? If you miss a dose, take it as soon as you can. If it is almost time for your next dose, take only that dose. Do not take double or extra doses. What may interact with this medicine? -cyclosporine This list may not describe all possible interactions. Give your health care provider a list of all the medicines, herbs, non-prescription drugs, or dietary supplements you use. Also tell them if you smoke, drink  alcohol, or use illegal drugs. Some items may interact with your medicine. What should I watch for while using this medicine? Visit your doctor for regular check ups. Tell your doctor or healthcare professional if your symptoms do not start to get better or if they get worse. This medicine can cause birth defects. Do not get pregnant while taking this drug. Females with child-bearing potential will need to have a negative pregnancy test before starting this medicine and every month while taking this medicine. Use 2 reliable forms of birth control together while you are taking this medicine and for 1 month after you stop taking this medicine. If you think that you might be pregnant talk to your doctor right away. Because of the risks of birth defects, this medicine is available only through a special program. Doctors, pharmacies, and patients must meet all of the conditions of the program. Your health care provider will help you get signed up with the program if you need this medicine. This medicine has caused reduced  sperm counts in some men. This may interfere with the ability to father a child. You should talk with your doctor or health care professional if you are concerned about your fertility. What side effects may I notice from receiving this medicine? Side effects that you should report to your doctor or health care professional as soon as possible: -allergic reactions like skin rash, itching or hives, swelling of the face, lips, or tongue -breathing problems -dizziness -fast, irregular heartbeat -signs and symptoms of liver injury like dark yellow or brown urine; general ill feeling or flu-like symptoms; light-colored stools; loss of appetite; nausea; right upper belly pain; unusually weak or tired; yellowing of the eyes or skin -sudden weight gain -swelling of the ankles or legs -unusually weak or tired Side effects that usually do not require medical attention (report to your doctor or  health care professional if they continue or are bothersome): -congested or runny nose -flushing of the skin -headache -sore throat -upset stomach This list may not describe all possible side effects. Call your doctor for medical advice about side effects. You may report side effects to FDA at 1-800-FDA-1088. Where should I keep my medicine? Keep out of the reach of children. Store at room temperature between 15 and 30 degrees C (59 and 86 degrees F). Throw away any unused medicine after the expiration date. NOTE: This sheet is a summary. It may not cover all possible information. If you have questions about this medicine, talk to your doctor, pharmacist, or health care provider.  2018 Elsevier/Gold Standard (2016-08-21 13:05:42)

## 2018-10-08 NOTE — Progress Notes (Signed)
Subjective:    [name], female    DOB: Nov 30, 1950, 67 y.o.   MRN: [id number]  Synopsis: Referred in 2018 for evaluation of cough, found to have bronchiectasis on a CT scan of the chest in December 2018.  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  A modified barium swallow test performed in early 2019 showed some penetration of thin liquids to the piriform sinus but no laryngeal penetration or aspiration. Smoked 2 PP for 28 years, quit 2003 Has GERD and hypertrophic cardiomyopathy.   Chief Complaint  Patient presents with  . Follow-up    pt c/o prod cough with bright green mucus X1 month.  worse qam.    Desiree [name] has been struggling since I saw her last.  She was hospitalized for a respiratory infectious syndrome but then was identified to have hyper eosinophilia and a very elevated IgE value.  In the midst of this she has developed worsening Raynaud's phenomena and now has digital ischemia of her fingers.  She has also been seen by hematology who has given consideration to a bone marrow biopsy.  She says that her breathing has been stable.  She keeps using the therapy vest twice a day as well as her Anoro.  She says she does cough up green mucus in the mornings.   Past Medical History:  Diagnosis Date  . Anxiety   . Bipolar disorder ([place])   . CANDIDIASIS, ESOPHAGEAL 07/28/2009   Qualifier: Diagnosis of  By: [name] MD, [name]   . Chronic kidney disease    CKD III  . COPD (chronic obstructive pulmonary disease) ([place])   . Depression   . Diabetes mellitus without complication (HCC)    no meds  . FH: colonic polyps   . Fractured elbow    right   . GERD (gastroesophageal reflux disease)   . HH (hiatus hernia)   . History of carpal tunnel syndrome   . History of chest pain   . History of transfusion of packed red blood cells   . Hyperlipidemia   . Hypertension   . Neuroleptic-induced tardive dyskinesia   . Osteoarthritis of more  than one site   . Seasonal allergies       Review of Systems  Constitutional: Negative for fever and unexpected weight change.  HENT: Positive for congestion. Negative for dental problem, ear pain, nosebleeds, postnasal drip, rhinorrhea, sinus pressure, sneezing, sore throat and trouble swallowing.   Respiratory: Positive for cough, chest tightness and shortness of breath. Negative for wheezing.   Cardiovascular: Negative for palpitations and leg swelling.  Gastrointestinal: Negative for vomiting.  Skin: Negative for rash.       Objective:   Physical Exam  Vitals:   10/08/18 1006  BP: 114/66  Pulse: 64  SpO2: 92%  Weight: 128 lb (58.1 kg)  Height: _0  (1.575 m)    Gen: well appearing HENT: OP clear, TM's clear, neck supple PULM: CTA B, normal percussion CV: RRR, no mgr, trace edema GI: BS+, soft, nontender Derm: severe digital ischemia of fingers bilaterally Psyche: normal mood and affect    CBC    Component Value Date/Time   WBC 21.3 (H) 08/14/2018 1505   WBC 17.5 (H) 08/08/2018 1023   RBC 4.00 08/14/2018 1505   HGB 11.7 (L) 08/14/2018 1505   HCT 36.5 08/14/2018 1505   PLT 447 (H) 08/14/2018 1505   MCV 91.3 08/14/2018 1505   MCH 29.3 08/14/2018 1505  MCHC 32.1 08/14/2018 1505   RDW 15.2 08/14/2018 1505   LYMPHSABS 1.9 08/14/2018 1505   MONOABS 0.9 08/14/2018 1505   EOSABS 10.0 (H) 08/14/2018 1505   BASOSABS 0.2 (H) 08/14/2018 1505    Labs: December 2018 rheumatoid factor elevated at 127, CCP negative December 2018 serum IgE slightly elevated at 120 kU/L September 2019 ANCA panel negative, September 2019 serum IgE 1224, Aspergillus specific IgE negative November 2019 ANA negative, ENA smooth muscle negative, complements normal, RNP slightly +1.2, SSA/SSB negative, SCL 70 negative  Chest imaging: August 22, 2017 images independently reviewed showing normal pulmonary lung fields.  January 2019 high-resolution CT chest images independently reviewed  showing no emphysema, there is some mild bronchiectasis and mucous plugging in the bases of both lungs bilaterally September 2019 CT chest images independently reviewed showing significant airway thickening, mucous plugging, some atelectasis in the right base worse than left  Other imaging: February 2019 barium swallow showed premature spill into the piriform sinuses with thin liquid but no evidence of aspiration  Echocardiogram: June 27, 2017 transthoracic US echocardiogram LVEF 65-70%, severe asymmetric septal hypertrophy grade 1 diastolic dysfunction, right ventricle cavity size normal, systolic function normal  PFT: November 2018 spirometry normal January 2019 ratio normal, FEV1 1.36 L 74% predicted, FVC 1.79 L 76% predicted, total lung capacity 6.19 L 128% predicted, residual volume 79% predicted, DLCO 13.3 (59% predicted) flow volume loop is consistent with small airways obstruction  Hospitalization records reviewed from where she was seen for gastritis and she was treated with fluids, Decadron.  Symptoms improved.  Extensive records reviewed since the last visit with me.  She had a hospitalization for hypereosinophilia, pulmonary was consulted back in the summertime when she was hospitalized and there was no clear pulmonary cause identified.  She was then referred to hematology who performed a lab work-up which was unrevealing.  There was mention of a bone marrow biopsy.  She had ANCA levels which were normal     Assessment & Plan:    Bronchiectasis without complication ([place]) - Plan: Respiratory or Resp and Sputum Culture, Fungus Culture & Smear, AFB Culture & Smear  Eosinophilia  Allergic rhinitis due to pollen, unspecified seasonality  Centrilobular emphysema (HCC)  Leukocytosis, unspecified type  Discussion: To be honest, I do not know what is going on with [facility].  She has a very concerning syndrome of underlying bronchiectasis which seems to be fairly well controlled but  she is now developing severe digital ischemia related to Raynaud's phenomena and she continues to have this unexplained hypereosinophilia and serum IgE value.  From the problem of hypereosinophilia and elevated serum IgE I will check sputum cultures today to see if there is possibly an organism which could be driving this.  However with the other systemic symptoms she is having I am more concerned about an autoimmune process or something like a paraneoplastic problem.  Plan: Raynaud's Phenomena with Digital Ischemia When You See Dr.Deveshwar Today If She Wants to Do Labs I Recommend the Following: Serum Immunoglobulin Profile,IgG Subclass Analysis, Serum Eosinophil Count (CBC with Differential) If She Is Not Going to Order Labs Then We Can Draw Them in [place] with Rheumatology  Bronchiectasis in Setting of Underlying Connective Tissue Disease: I Am Becoming More Concerned That This Is Due To an Underlying Autoimmune Problem Where Your Body's Immune System Is Attacking Her Lungs If Dr.Deveshwar Feels That It Is Appropriate for You to Have Treatment to Suppress Your Immune System I Agree with This Keep Using  the Therapy Vest Twice a Day  Dr. [place] Bells COPD: Continue Using Anoro Practice Good Hand Hygiene Stay Active  Serum Eosinophilia with Hyper IgE Syndrome: Continue Follow-Up with Hematology  We Will See You Back in 6 Weeks or Sooner If Needed    Current Outpatient Medications:  .  acetaminophen (TYLENOL) 500 MG tablet, Take 2 tablets (1,000 mg total) by mouth every 6 (six) hours as needed. (Patient taking differently: Take 1,000 mg by mouth every 6 (six) hours as needed for mild pain or headache. ), Disp: 30 tablet, Rfl: 0 .  amLODipine (NORVASC) 2.5 MG tablet, Take 1 tablet (2.5 mg total) by mouth daily. raynauds? (Patient taking differently: Take 2.5 mg by mouth daily as needed (SBP >136). raynauds?), Disp: 30 tablet, Rfl: 1 .  atorvastatin (LIPITOR) 20 MG tablet, TAKE 1  TABLET BY MOUTH ONCE DAILY (Patient taking differently: Take 20 mg by mouth daily. ), Disp: 90 tablet, Rfl: 0 .  Blood Glucose Monitoring Suppl (ACCU-CHEK NANO SMARTVIEW) W/DEVICE KIT, Use to check blood sugar 1-2 times daily (Patient taking differently: 1 strip by Other route 2 (two) times a week. ), Disp: 1 kit, Rfl: 0 .  cetirizine (ZYRTEC) 10 MG tablet, Take 10 mg by mouth at bedtime. , Disp: , Rfl:  .  diclofenac sodium (VOLTAREN) 1 % GEL, Apply 3 grams to 3 large joints, up to 3 times daily. (Patient taking differently: Apply 1 application topically 3 (three) times daily as needed (joint pain). ), Disp: 3 Tube, Rfl: 3 .  docusate sodium (COLACE) 100 MG capsule, Take 100 mg by mouth at bedtime. , Disp: , Rfl:  .  DULoxetine (CYMBALTA) 30 MG capsule, 3 tabs qd. (Patient taking differently: Take 30 mg by mouth See admin instructions. Take one capsule (30 mg) by mouth with a 60 mg capsule daily - for a total dose of 90 mg), Disp: 90 capsule, Rfl: 1 .  DULoxetine (CYMBALTA) 60 MG capsule, Take 60 mg by mouth See admin instructions. Take one capsule (60 mg) by mouth with a 30 mg capsule daily - for a total dose of 90 mg, Disp: , Rfl:  .  feeding supplement, ENSURE ENLIVE, (ENSURE ENLIVE) LIQD, Take 237 mLs by mouth 3 (three) times daily between meals. (Patient taking differently: Take 237 mLs by mouth See admin instructions. Take one bottle (237 ml) by mouth up to two times daily.), Disp: 14 Bottle, Rfl: 0 .  fluticasone (FLONASE) 50 MCG/ACT nasal spray, Place 1-2 sprays into both nostrils at bedtime. , Disp: , Rfl:  .  glucose blood test strip, Accu-chek nano, test glucose once daily, dx E11.9, Disp: 100 each, Rfl: 12 .  guaiFENesin (MUCINEX) 600 MG 12 hr tablet, Take 600 mg by mouth at bedtime., Disp: , Rfl:  .  ipratropium-albuterol (DUONEB) 0.5-2.5 (3) MG/3ML SOLN, INHALE CONTENTS OF 1 VIAL THREE TIMES A DAY IN NEBULIZER (Patient taking differently: Take 3 mLs by nebulization 3 (three) times daily. ),  Disp: 1080 mL, Rfl: 4 .  lamoTRIgine (LAMICTAL) 25 MG tablet, Take 1 tablet (25 mg total) by mouth 2 (two) times daily., Disp: 60 tablet, Rfl: 1 .  Lancets Misc. (ACCU-CHEK FASTCLIX LANCET) KIT, USE TO CHECK BLOOD SUGAR 1-2 TIMES DAILY Dx E11.9, Disp: 1 kit, Rfl: 1 .  linaclotide (LINZESS) 145 MCG CAPS capsule, Take 145 mcg by mouth daily as needed (constipation)., Disp: , Rfl:  .  losartan (COZAAR) 50 MG tablet, Take 1 tablet (50 mg total) by mouth daily., Disp: 90 tablet,  Rfl: 0 .  montelukast (SINGULAIR) 10 MG tablet, TAKE 1 TABLET BY MOUTH AT BEDTIME (Patient taking differently: Take 10 mg by mouth at bedtime. ), Disp: 90 tablet, Rfl: 0 .  Multiple Vitamin (MULTIVITAMIN WITH MINERALS) TABS tablet, Take 1 tablet by mouth daily. Centrum Silver, Disp: , Rfl:  .  NON FORMULARY, Respivest: Three times a day for 30 minutes, Disp: , Rfl:  .  ondansetron (ZOFRAN-ODT) 4 MG disintegrating tablet, DISSOLVE 1 TABLET(4 MG) ON THE TONGUE EVERY 8 HOURS AS NEEDED FOR NAUSEA OR VOMITING (Patient taking differently: Take 4 mg by mouth every 8 (eight) hours as needed for nausea or vomiting. ), Disp: 20 tablet, Rfl: 0 .  OVER THE COUNTER MEDICATION, Take 1 capsule by mouth at bedtime. Fdgard, Disp: , Rfl:  .  pantoprazole (PROTONIX) 40 MG tablet, Take 40 mg by mouth daily., Disp: , Rfl:  .  Probiotic Product (ALIGN PO), Take 1 capsule by mouth daily with breakfast. , Disp: , Rfl:  .  Respiratory Therapy Supplies (FLUTTER) DEVI, 1 Device by Does not apply route as directed. (Patient taking differently: 1 Device as directed. ), Disp: 1 each, Rfl: 0 .  sodium chloride HYPERTONIC 3 % nebulizer solution, Take by nebulization 2 (two) times daily. (Patient taking differently: Take 4 mLs by nebulization 2 (two) times daily. ), Disp: 750 mL, Rfl: 5 .  triamcinolone cream (KENALOG) 0.1 %, Apply 1 application topically daily as needed (itching). , Disp: , Rfl: 0 .  umeclidinium-vilanterol (ANORO ELLIPTA) 62.5-25 MCG/INH AEPB,  Inhale 1 puff into the lungs daily., Disp: 60 each, Rfl: 5

## 2018-10-08 NOTE — Patient Outreach (Signed)
[facility]) Care Management  10/08/2018  Desiree [name] 12/21/1950 [id number]    Unsuccessful call #2 placed to patient regarding patient assistance medication receipt from [employer] for Anoro Ellipta, HIPAA compliant voicemail left.   Will make 3rd call attempt in 2-3 business days if call has not been returned.   [name] Effie Shy CPhT Certified Pharmacy Technician [facility] Care Management Direct Dial:[phone]

## 2018-10-08 NOTE — Telephone Encounter (Signed)
Patient has been contacted to come in for sooner appointment.

## 2018-10-08 NOTE — Telephone Encounter (Signed)
Patient left a voicemail stating she went to the Emergency Room last week due to her fingers turning grey.  Patient states her fingers are darker now than they were last week and Tylenol is not helping with the pain and requesting something else.  Patient states her left hand middle finger has turned "very dark."  Patient requested a return call.
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
## 2018-10-08 NOTE — Patient Instructions (Signed)
Raynaud's phenomena with digital ischemia When you see Dr. Corliss Skains  today if she wants to do labs I recommend the following: Serum immunoglobulin profile, IgG subclass analysis, serum eosinophil count (CBC with differential) If she is not going to order labs then we can draw them in our office Follow-up with rheumatology  Bronchiectasis in setting of underlying connective tissue disease: I am becoming more concerned that this is due to an underlying autoimmune problem where your body's immune system is attacking her lungs If Dr. Corliss Skains feels that it is appropriate for you to have treatment to suppress your immune system I agree with this Keep using the therapy vest twice a day  Dr. Kendrick Fries COPD: Continue using [facility] good hand hygiene Stay active  Serum eosinophilia with hyper IgE syndrome: Continue follow-up with hematology    We will see you back in 6 weeks or sooner if needed

## 2018-10-08 NOTE — Progress Notes (Signed)
Pharmacy Note  Subjective: Patient presents today to the [facility] to see Dr. Corliss Skains.  Patient seen by the pharmacist for counseling on hydroxychloroquine for autoimmune disease and Raynaud's.    Objective: CMP     Component Value Date/Time   NA 141 08/08/2018 0430   NA 138 12/13/2017   K 4.1 08/08/2018 0430   CL 112 (H) 08/08/2018 0430   CO2 24 08/08/2018 0430   GLUCOSE 107 (H) 08/08/2018 0430   BUN 19 08/08/2018 0430   BUN 18 12/13/2017   CREATININE 1.11 (H) 08/08/2018 0430   CALCIUM 8.2 (L) 08/08/2018 0430   CALCIUM 10.1 06/25/2008 0000   PROT 5.7 (L) 08/07/2018 0530   ALBUMIN 2.3 (L) 08/07/2018 0530   AST 32 08/07/2018 0530   ALT 23 08/07/2018 0530   ALKPHOS 81 08/07/2018 0530   BILITOT 0.4 08/07/2018 0530   GFRNONAA 51 (L) 08/08/2018 0430   GFRAA 59 (L) 08/08/2018 0430    CBC    Component Value Date/Time   WBC 21.3 (H) 08/14/2018 1505   WBC 17.5 (H) 08/08/2018 1023   RBC 4.00 08/14/2018 1505   HGB 11.7 (L) 08/14/2018 1505   HCT 36.5 08/14/2018 1505   PLT 447 (H) 08/14/2018 1505   MCV 91.3 08/14/2018 1505   MCH 29.3 08/14/2018 1505   MCHC 32.1 08/14/2018 1505   RDW 15.2 08/14/2018 1505   LYMPHSABS 1.9 08/14/2018 1505   MONOABS 0.9 08/14/2018 1505   EOSABS 10.0 (H) 08/14/2018 1505   BASOSABS 0.2 (H) 08/14/2018 1505    Assessment/Plan: Patient was prescribed hydroxychloroquine 200 mg twice daily Monday through Friday based on weight of 58 kg.  Patient was counseled on the purpose, proper use, and adverse effects of hydroxychloroquine including nausea/diarrhea, skin rash, headaches, and sun sensitivity.  Discussed importance of annual eye exams while on hydroxychloroquine to monitor to ocular toxicity and discussed importance of frequent laboratory monitoring.  Provided patient with eye exam form for baseline ophthalmologic exam and standing lab instructions.  Provided patient with educational materials on hydroxychloroquine and answered all  questions.  Patient consented to hydroxychloroquine.  Will upload consent in the media tab.    Also counseled on topical nitroglycerin and ambrisentan on appropriate use, administration, and common side effect  All questions encouraged and answered.  Instructed patient to call with any further questions or concerns.  Verlin Fester, PharmD, [facility] Clinical Pharmacist  10/08/2018 3:25 PM

## 2018-10-09 ENCOUNTER — Telehealth: Payer: Self-pay | Admitting: Rheumatology

## 2018-10-09 ENCOUNTER — Encounter (HOSPITAL_COMMUNITY): Payer: Self-pay | Admitting: Pharmacy Technician

## 2018-10-09 ENCOUNTER — Other Ambulatory Visit: Payer: Self-pay

## 2018-10-09 ENCOUNTER — Telehealth: Payer: Self-pay | Admitting: Pharmacy Technician

## 2018-10-09 ENCOUNTER — Inpatient Hospital Stay (HOSPITAL_COMMUNITY)
Admission: EM | Admit: 2018-10-09 | Discharge: 2018-10-16 | DRG: 513 | Disposition: A | Discharge status: Home / Self Care | Payer: Medicare HMO | Attending: Internal Medicine | Admitting: Internal Medicine

## 2018-10-09 ENCOUNTER — Emergency Department (HOSPITAL_COMMUNITY): Payer: Medicare HMO

## 2018-10-09 DIAGNOSIS — I96 Gangrene, not elsewhere classified: Secondary | ICD-10-CM | POA: Diagnosis present

## 2018-10-09 DIAGNOSIS — Z808 Family history of malignant neoplasm of other organs or systems: Secondary | ICD-10-CM

## 2018-10-09 DIAGNOSIS — M21611 Bunion of right foot: Secondary | ICD-10-CM | POA: Diagnosis present

## 2018-10-09 DIAGNOSIS — M130 Polyarthritis, unspecified: Secondary | ICD-10-CM | POA: Diagnosis present

## 2018-10-09 DIAGNOSIS — Z87891 Personal history of nicotine dependence: Secondary | ICD-10-CM

## 2018-10-09 DIAGNOSIS — F3132 Bipolar disorder, current episode depressed, moderate: Secondary | ICD-10-CM | POA: Diagnosis present

## 2018-10-09 DIAGNOSIS — E0822 Diabetes mellitus due to underlying condition with diabetic chronic kidney disease: Secondary | ICD-10-CM

## 2018-10-09 DIAGNOSIS — K219 Gastro-esophageal reflux disease without esophagitis: Secondary | ICD-10-CM | POA: Diagnosis not present

## 2018-10-09 DIAGNOSIS — K210 Gastro-esophageal reflux disease with esophagitis: Secondary | ICD-10-CM | POA: Diagnosis not present

## 2018-10-09 DIAGNOSIS — I129 Hypertensive chronic kidney disease with stage 1 through stage 4 chronic kidney disease, or unspecified chronic kidney disease: Secondary | ICD-10-CM | POA: Diagnosis not present

## 2018-10-09 DIAGNOSIS — Z794 Long term (current) use of insulin: Secondary | ICD-10-CM | POA: Diagnosis not present

## 2018-10-09 DIAGNOSIS — Z8249 Family history of ischemic heart disease and other diseases of the circulatory system: Secondary | ICD-10-CM

## 2018-10-09 DIAGNOSIS — E1152 Type 2 diabetes mellitus with diabetic peripheral angiopathy with gangrene: Secondary | ICD-10-CM | POA: Diagnosis present

## 2018-10-09 DIAGNOSIS — Z803 Family history of malignant neoplasm of breast: Secondary | ICD-10-CM

## 2018-10-09 DIAGNOSIS — I70208 Unspecified atherosclerosis of native arteries of extremities, other extremity: Secondary | ICD-10-CM | POA: Diagnosis present

## 2018-10-09 DIAGNOSIS — Z79899 Other long term (current) drug therapy: Secondary | ICD-10-CM

## 2018-10-09 DIAGNOSIS — Z888 Allergy status to other drugs, medicaments and biological substances status: Secondary | ICD-10-CM

## 2018-10-09 DIAGNOSIS — Z981 Arthrodesis status: Secondary | ICD-10-CM

## 2018-10-09 DIAGNOSIS — M79643 Pain in unspecified hand: Secondary | ICD-10-CM | POA: Diagnosis present

## 2018-10-09 DIAGNOSIS — I10 Essential (primary) hypertension: Secondary | ICD-10-CM | POA: Diagnosis not present

## 2018-10-09 DIAGNOSIS — I422 Other hypertrophic cardiomyopathy: Secondary | ICD-10-CM | POA: Diagnosis not present

## 2018-10-09 DIAGNOSIS — E782 Mixed hyperlipidemia: Secondary | ICD-10-CM

## 2018-10-09 DIAGNOSIS — M8628 Subacute osteomyelitis, other site: Secondary | ICD-10-CM | POA: Diagnosis not present

## 2018-10-09 DIAGNOSIS — I7301 Raynaud's syndrome with gangrene: Secondary | ICD-10-CM | POA: Diagnosis not present

## 2018-10-09 DIAGNOSIS — I517 Cardiomegaly: Secondary | ICD-10-CM | POA: Diagnosis present

## 2018-10-09 DIAGNOSIS — J479 Bronchiectasis, uncomplicated: Secondary | ICD-10-CM

## 2018-10-09 DIAGNOSIS — Z833 Family history of diabetes mellitus: Secondary | ICD-10-CM

## 2018-10-09 DIAGNOSIS — R079 Chest pain, unspecified: Secondary | ICD-10-CM | POA: Diagnosis not present

## 2018-10-09 DIAGNOSIS — J449 Chronic obstructive pulmonary disease, unspecified: Secondary | ICD-10-CM | POA: Diagnosis present

## 2018-10-09 DIAGNOSIS — N183 Chronic kidney disease, stage 3 (moderate): Secondary | ICD-10-CM | POA: Diagnosis present

## 2018-10-09 DIAGNOSIS — D721 Eosinophilia, unspecified: Secondary | ICD-10-CM | POA: Diagnosis present

## 2018-10-09 DIAGNOSIS — D72829 Elevated white blood cell count, unspecified: Secondary | ICD-10-CM | POA: Diagnosis present

## 2018-10-09 DIAGNOSIS — E785 Hyperlipidemia, unspecified: Secondary | ICD-10-CM | POA: Diagnosis present

## 2018-10-09 DIAGNOSIS — Z880 Allergy status to penicillin: Secondary | ICD-10-CM | POA: Diagnosis not present

## 2018-10-09 DIAGNOSIS — I700 Atherosclerosis of aorta: Secondary | ICD-10-CM | POA: Diagnosis not present

## 2018-10-09 DIAGNOSIS — E1122 Type 2 diabetes mellitus with diabetic chronic kidney disease: Secondary | ICD-10-CM | POA: Diagnosis present

## 2018-10-09 DIAGNOSIS — N289 Disorder of kidney and ureter, unspecified: Secondary | ICD-10-CM | POA: Diagnosis not present

## 2018-10-09 DIAGNOSIS — Z8371 Family history of colonic polyps: Secondary | ICD-10-CM

## 2018-10-09 DIAGNOSIS — Z01810 Encounter for preprocedural cardiovascular examination: Secondary | ICD-10-CM | POA: Diagnosis not present

## 2018-10-09 DIAGNOSIS — Z7951 Long term (current) use of inhaled steroids: Secondary | ICD-10-CM

## 2018-10-09 DIAGNOSIS — Z801 Family history of malignant neoplasm of trachea, bronchus and lung: Secondary | ICD-10-CM

## 2018-10-09 DIAGNOSIS — R0789 Other chest pain: Secondary | ICD-10-CM | POA: Diagnosis not present

## 2018-10-09 DIAGNOSIS — S68113A Complete traumatic metacarpophalangeal amputation of left middle finger, initial encounter: Secondary | ICD-10-CM | POA: Diagnosis not present

## 2018-10-09 DIAGNOSIS — E1129 Type 2 diabetes mellitus with other diabetic kidney complication: Secondary | ICD-10-CM | POA: Diagnosis present

## 2018-10-09 LAB — COMPREHENSIVE METABOLIC PANEL
ALT: 15 U/L (ref 0–44)
AST: 24 U/L (ref 15–41)
Albumin: 2.2 g/dL — ABNORMAL LOW (ref 3.5–5.0)
Alkaline Phosphatase: 92 U/L (ref 38–126)
Anion gap: 10 (ref 5–15)
BUN: 16 mg/dL (ref 8–23)
CO2: 20 mmol/L — ABNORMAL LOW (ref 22–32)
Calcium: 8.4 mg/dL — ABNORMAL LOW (ref 8.9–10.3)
Chloride: 108 mmol/L (ref 98–111)
Creatinine, Ser: 1.4 mg/dL — ABNORMAL HIGH (ref 0.44–1.00)
GFR calc Af Amer: 45 mL/min — ABNORMAL LOW (ref >60)
GFR calc non Af Amer: 39 mL/min — ABNORMAL LOW (ref >60)
Glucose, Bld: 105 mg/dL — ABNORMAL HIGH (ref 70–99)
Potassium: 3.6 mmol/L (ref 3.5–5.1)
Sodium: 138 mmol/L (ref 135–145)
Total Bilirubin: 0.7 mg/dL (ref 0.3–1.2)
Total Protein: 7.1 g/dL (ref 6.5–8.1)

## 2018-10-09 LAB — CBC
HCT: 33.3 % — ABNORMAL LOW (ref 36.0–46.0)
Hemoglobin: 10.5 g/dL — ABNORMAL LOW (ref 12.0–15.0)
MCH: 29 pg (ref 26.0–34.0)
MCHC: 31.5 g/dL (ref 30.0–36.0)
MCV: 92 fL (ref 80.0–100.0)
Platelets: 428 10*3/uL — ABNORMAL HIGH (ref 150–400)
RBC: 3.62 MIL/uL — ABNORMAL LOW (ref 3.87–5.11)
RDW: 18.6 % — ABNORMAL HIGH (ref 11.5–15.5)
WBC: 21.2 10*3/uL — ABNORMAL HIGH (ref 4.0–10.5)
nRBC: 0 % (ref 0.0–0.2)

## 2018-10-09 LAB — PROTIME-INR
INR: 1.16
Prothrombin Time: 14.7 seconds (ref 11.4–15.2)

## 2018-10-09 LAB — GLUCOSE, CAPILLARY: Glucose-Capillary: 94 mg/dL (ref 70–99)

## 2018-10-09 LAB — TYPE AND SCREEN
ABO/RH(D): A POS
Antibody Screen: NEGATIVE

## 2018-10-09 LAB — APTT: aPTT: 36 seconds (ref 24–36)

## 2018-10-09 MED ORDER — IPRATROPIUM-ALBUTEROL 0.5-2.5 (3) MG/3ML IN SOLN
3.0000 mL | Freq: Three times a day (TID) | RESPIRATORY_TRACT | Status: DC
  Administered 2018-10-09 – 2018-10-10 (×2): 3 mL via RESPIRATORY_TRACT
  Filled 2018-10-09 (×2): qty 3

## 2018-10-09 MED ORDER — HYDROXYCHLOROQUINE SULFATE 200 MG PO TABS
200.0000 mg | ORAL_TABLET | ORAL | Status: DC
  Administered 2018-10-09 – 2018-10-15 (×9): 200 mg via ORAL
  Filled 2018-10-09 (×9): qty 1

## 2018-10-09 MED ORDER — ONDANSETRON HCL 4 MG PO TABS
4.0000 mg | ORAL_TABLET | Freq: Four times a day (QID) | ORAL | Status: DC | PRN
  Administered 2018-10-16: 4 mg via ORAL
  Filled 2018-10-09: qty 1

## 2018-10-09 MED ORDER — MONTELUKAST SODIUM 10 MG PO TABS
10.0000 mg | ORAL_TABLET | Freq: Every day | ORAL | Status: DC
  Administered 2018-10-09 – 2018-10-15 (×7): 10 mg via ORAL
  Filled 2018-10-09 (×7): qty 1

## 2018-10-09 MED ORDER — NITROGLYCERIN 2 % TD OINT
0.2500 [in_us] | TOPICAL_OINTMENT | Freq: Three times a day (TID) | TRANSDERMAL | Status: DC
  Administered 2018-10-10 – 2018-10-16 (×16): 0.25 [in_us] via TOPICAL
  Filled 2018-10-09 (×3): qty 30

## 2018-10-09 MED ORDER — LAMOTRIGINE 25 MG PO TABS
25.0000 mg | ORAL_TABLET | Freq: Two times a day (BID) | ORAL | Status: DC
  Administered 2018-10-09 – 2018-10-16 (×14): 25 mg via ORAL
  Filled 2018-10-09 (×15): qty 1

## 2018-10-09 MED ORDER — IOPAMIDOL (ISOVUE-370) INJECTION 76%
INTRAVENOUS | Status: AC
  Filled 2018-10-09: qty 100

## 2018-10-09 MED ORDER — SODIUM CHLORIDE 0.9 % IV BOLUS
500.0000 mL | Freq: Once | INTRAVENOUS | Status: AC
  Administered 2018-10-09: 500 mL via INTRAVENOUS

## 2018-10-09 MED ORDER — HEPARIN (PORCINE) 25000 UT/250ML-% IV SOLN
800.0000 [IU]/h | INTRAVENOUS | Status: DC
  Administered 2018-10-09: 700 [IU]/h via INTRAVENOUS
  Administered 2018-10-10: 800 [IU]/h via INTRAVENOUS
  Filled 2018-10-09 (×2): qty 250

## 2018-10-09 MED ORDER — IOPAMIDOL (ISOVUE-370) INJECTION 76%
100.0000 mL | Freq: Once | INTRAVENOUS | Status: AC | PRN
  Administered 2018-10-09: 100 mL via INTRAVENOUS

## 2018-10-09 MED ORDER — ATORVASTATIN CALCIUM 10 MG PO TABS
20.0000 mg | ORAL_TABLET | Freq: Every day | ORAL | Status: DC
  Administered 2018-10-09 – 2018-10-15 (×7): 20 mg via ORAL
  Filled 2018-10-09 (×8): qty 2

## 2018-10-09 MED ORDER — AMBRISENTAN 5 MG PO TABS
5.0000 mg | ORAL_TABLET | Freq: Every day | ORAL | Status: DC
  Administered 2018-10-10: 5 mg via ORAL
  Filled 2018-10-09: qty 1

## 2018-10-09 MED ORDER — DULOXETINE HCL 60 MG PO CPEP: 60.0000 mg | ORAL_CAPSULE | ORAL | Status: DC

## 2018-10-09 MED ORDER — UMECLIDINIUM-VILANTEROL 62.5-25 MCG/INH IN AEPB
1.0000 | INHALATION_SPRAY | Freq: Every day | RESPIRATORY_TRACT | Status: DC
  Administered 2018-10-10 – 2018-10-14 (×3): 1 via RESPIRATORY_TRACT
  Filled 2018-10-09 (×3): qty 14

## 2018-10-09 MED ORDER — DULOXETINE HCL 30 MG PO CPEP: 30.0000 mg | ORAL_CAPSULE | ORAL | Status: DC

## 2018-10-09 MED ORDER — ACETAMINOPHEN 325 MG PO TABS
650.0000 mg | ORAL_TABLET | Freq: Four times a day (QID) | ORAL | Status: DC | PRN
  Administered 2018-10-10 – 2018-10-11 (×4): 650 mg via ORAL
  Filled 2018-10-09 (×4): qty 2

## 2018-10-09 MED ORDER — HEPARIN BOLUS VIA INFUSION
3500.0000 [IU] | Freq: Once | INTRAVENOUS | Status: AC
  Administered 2018-10-09: 3500 [IU] via INTRAVENOUS
  Filled 2018-10-09: qty 3500

## 2018-10-09 MED ORDER — LINACLOTIDE 145 MCG PO CAPS
145.0000 ug | ORAL_CAPSULE | Freq: Every day | ORAL | Status: DC | PRN
  Filled 2018-10-09: qty 1

## 2018-10-09 MED ORDER — GUAIFENESIN ER 600 MG PO TB12
600.0000 mg | ORAL_TABLET | Freq: Every day | ORAL | Status: DC
  Administered 2018-10-09 – 2018-10-15 (×7): 600 mg via ORAL
  Filled 2018-10-09 (×7): qty 1

## 2018-10-09 MED ORDER — DULOXETINE HCL 60 MG PO CPEP
90.0000 mg | ORAL_CAPSULE | Freq: Every day | ORAL | Status: DC
  Administered 2018-10-09 – 2018-10-16 (×8): 90 mg via ORAL
  Filled 2018-10-09 (×8): qty 1

## 2018-10-09 MED ORDER — INSULIN ASPART 100 UNIT/ML ~~LOC~~ SOLN: 0.0000 [IU] | Freq: Every day | SUBCUTANEOUS | Status: DC

## 2018-10-09 MED ORDER — HYDROMORPHONE HCL 1 MG/ML IJ SOLN
0.5000 mg | Freq: Once | INTRAMUSCULAR | Status: AC
  Administered 2018-10-09: 0.5 mg via INTRAVENOUS
  Filled 2018-10-09: qty 1

## 2018-10-09 MED ORDER — ACETAMINOPHEN 650 MG RE SUPP: 650.0000 mg | Freq: Four times a day (QID) | RECTAL | Status: DC | PRN

## 2018-10-09 MED ORDER — SODIUM CHLORIDE 3 % IN NEBU
4.0000 mL | INHALATION_SOLUTION | Freq: Two times a day (BID) | RESPIRATORY_TRACT | Status: AC
  Administered 2018-10-10 – 2018-10-12 (×5): 4 mL via RESPIRATORY_TRACT
  Filled 2018-10-09 (×6): qty 4

## 2018-10-09 MED ORDER — NITROGLYCERIN 2 % TD OINT
0.2500 [in_us] | TOPICAL_OINTMENT | Freq: Three times a day (TID) | TRANSDERMAL | Status: DC
  Administered 2018-10-09: 0.25 [in_us] via TOPICAL
  Filled 2018-10-09: qty 1

## 2018-10-09 MED ORDER — PANTOPRAZOLE SODIUM 40 MG PO TBEC
40.0000 mg | DELAYED_RELEASE_TABLET | Freq: Every day | ORAL | Status: DC
  Administered 2018-10-10 – 2018-10-16 (×7): 40 mg via ORAL
  Filled 2018-10-09 (×7): qty 1

## 2018-10-09 MED ORDER — SODIUM CHLORIDE 0.9 % IV SOLN
INTRAVENOUS | Status: AC
  Administered 2018-10-09: 23:00:00 via INTRAVENOUS

## 2018-10-09 MED ORDER — DOCUSATE SODIUM 100 MG PO CAPS
100.0000 mg | ORAL_CAPSULE | Freq: Every day | ORAL | Status: DC
  Administered 2018-10-09 – 2018-10-15 (×6): 100 mg via ORAL
  Filled 2018-10-09 (×6): qty 1

## 2018-10-09 MED ORDER — INSULIN ASPART 100 UNIT/ML ~~LOC~~ SOLN: 0.0000 [IU] | Freq: Three times a day (TID) | SUBCUTANEOUS | Status: DC

## 2018-10-09 MED ORDER — AMLODIPINE BESYLATE 5 MG PO TABS
5.0000 mg | ORAL_TABLET | Freq: Every day | ORAL | Status: DC
  Administered 2018-10-09 – 2018-10-10 (×2): 5 mg via ORAL
  Filled 2018-10-09 (×2): qty 1

## 2018-10-09 MED ORDER — ONDANSETRON HCL 4 MG/2ML IJ SOLN
4.0000 mg | Freq: Four times a day (QID) | INTRAMUSCULAR | Status: DC | PRN
  Administered 2018-10-12 – 2018-10-15 (×3): 4 mg via INTRAVENOUS
  Filled 2018-10-09 (×3): qty 2

## 2018-10-09 NOTE — ED Notes (Signed)
Placed call to [facility] to call back to Dr Pilar Plate
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
## 2018-10-09 NOTE — Telephone Encounter (Signed)
Received a fax from [facility] regarding a prior authorization for Nitro-bid. Authorization has been APPROVED from 10/09/18 to 10/23/19.   Will send document to scan center.  Authorization # [id number] Phone # [phone]  [facility], they ran prescription, $10.49 copay, they will fill prescription or patient and notify her that it is ready for pick up.  3:01 PM Dorthula Nettles, CPhT

## 2018-10-09 NOTE — Progress Notes (Signed)
ANTICOAGULATION CONSULT NOTE - Initial Consult  Pharmacy Consult for heparin Indication: Raynaud's  Allergies  Allergen Reactions  . Prednisone Shortness Of Breath, Itching, Nausea And Vomiting and Palpitations  . Solu-Medrol [Methylprednisolone] Anaphylaxis  . Amoxicillin Hives and Rash    Has patient had a PCN reaction causing immediate rash, facial/tongue/throat swelling, SOB or lightheadedness with hypotension:Yes Has patient had a PCN reaction causing severe rash involving mucus membranes or skin necrosis: NO Has patient had a PCN reaction that required hospitalization NO Has patient had a PCN reaction occurring within the last 10 years: NO If all of the above answers are "NO", then may proceed with Cephalosporin use.   . Codeine Hives, Itching and Rash  . Hydrocodone-Acetaminophen Hives, Itching and Rash  . Penicillins Hives, Itching and Rash    ALLERGIC REACTION TO ORAL AMOXICILLIN Has patient had a PCN reaction causing immediate rash, facial/tongue/throat swelling, SOB or lightheadedness with hypotension: Yes Has patient had a PCN reaction causing severe rash involving mucus membranes or skin necrosis: No Has patient had a PCN reaction that required hospitalization: No Has patient had a PCN reaction occurring within the last 10 years: No If all of the above answers are "NO", then may proceed with Cephalosporin use.    Patient Measurements: Height: 5\' 2"  (157.5 cm) Weight: 128 lb 15.5 oz (58.5 kg) IBW/kg (Calculated) : 50.1 Heparin Dosing Weight: 58.5kg  Vital Signs: Temp: 98.6 F (37 C) (12/18 1315) Temp Source: Oral (12/18 1315) BP: 168/76 (12/18 1530) Pulse Rate: 83 (12/18 1530)  Labs: Recent Labs    10/08/18 1535 10/09/18 1330  HGB 10.2* 10.5*  HCT 31.7* 33.3*  PLT 443* 428*  APTT  --  36  LABPROT  --  14.7  INR  --  1.16  CREATININE  --  1.40*    Estimated Creatinine Clearance: 31.3 mL/min (A) (by C-G formula based on SCr of 1.4 mg/dL  (H)).   Medical History: Past Medical History:  Diagnosis Date  . Anxiety   . Bipolar disorder (HCC)   . CANDIDIASIS, ESOPHAGEAL 07/28/2009   Qualifier: Diagnosis of  By: 09/27/2009 MD, Fabian Sharp   . Chronic kidney disease    CKD III  . COPD (chronic obstructive pulmonary disease) (HCC)   . Depression   . Diabetes mellitus without complication (HCC)    no meds  . FH: colonic polyps   . Fractured elbow    right   . GERD (gastroesophageal reflux disease)   . HH (hiatus hernia)   . History of carpal tunnel syndrome   . History of chest pain   . History of transfusion of packed red blood cells   . Hyperlipidemia   . Hypertension   . Neuroleptic-induced tardive dyskinesia   . Osteoarthritis of more than one site   . Seasonal allergies     Medications:  Infusions:  . heparin 700 Units/hr (10/09/18 1824)    Assessment: 61 yof presented to the ED with hand pain. To start IV heparin per [facility] MD recommendations for Raynaud's. Hgb is slightly low at 10.5 and platelets are elevated. She is not on anticoagulation PTA.   Goal of Therapy:  Heparin level 0.3-0.7 units/ml Monitor platelets by anticoagulation protocol: Yes   Plan:  Heparin bolus 3500 units IV x 1 Heparin gtt 700 units/hr Check a 6 hr heparin level Daily heparin level and CBC *Pending transfer to [facility], [facility] 10/09/2018,6:00 PM

## 2018-10-09 NOTE — Telephone Encounter (Signed)
Received a Prior Authorization request from [facility] for Nitrobid 2% ointment.Urgent Nonformulary Authorization has been submitted to patient's insurance via Cover My Meds. Will update once we receive a response.  2:48 PM Dorthula Nettles, CPhT

## 2018-10-09 NOTE — ED Triage Notes (Signed)
Pt arrives POV with discoloration to multiple fingertips. Hx Raynauds and diabetes. L middle fingertip appears necrotic. R little finger appears dusky with multiple ulcerations to fingers.

## 2018-10-09 NOTE — Telephone Encounter (Signed)
I called to check on patient.  She states with the medications she is having some improvement in the pain symptoms but her fingers are still blue and cold.  I am concerned about the digital ulcers she has developed.  She also had early gangrenous changes in her left third fingertip.  I had detailed discussion with patient about going back to the emergency room.  She was in agreement.  I explained to her that she will need hospitalization and close monitoring of her finger while she is on medications.  She is willing for admission.  I called the emergency room and spoke with Dr. Azalia Bilis who very kindly agreed to evaluate her and admit her to the hospital.  I have advised patient to go to the emergency room immediately. [name], MD

## 2018-10-09 NOTE — Telephone Encounter (Signed)
Left message to advise patient she needs to keep appointment with Dr.Deveshwar on 10/11/18 even if she has been unable to take her medication.

## 2018-10-09 NOTE — H&P (Signed)
Desiree [name] AOZ:[id number] DOB: 1951-03-18 DOA: 10/09/2018     PCP: [name], MD   Outpatient Specialists:   Cardiology [facility] Pulmonary   Dr. Lake Bells  Oncology [facility] Rheumatology [name], [facility] Patient arrived to ER on 10/09/18 at 1255  Patient coming from: home Lives alone,   Chief Complaint:  Chief Complaint  Patient presents with  . Hand Pain    HPI: Desiree [name] is a 67 y.o. female with medical history significant of Raynaud's disease with gangrene Bronchiectasis  polyarthritis with a positive rheumatoid factor.GERD and hypertrophic cardiomyopathy  hyper eosinophilia bipolar disorder, CKD stage III, COPD, depression, DM 2, HTN  Presented with worsening pain to the tips of her fingers. 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 Has been somewhat more so unstable with her gait also for the past few months reports lower extremity swelling at the ankles  Regarding pertinent Chronic problems:  On hydroxychloroquine for autoimmune disease and Raynaud's  June 27, 2017 transthoracic US echocardiogram LVEF 65-70%, severe asymmetric septal hypertrophy grade 1 diastolic dysfunction, right ventricle cavity size normal, systolic function normal   January 2019 ratio normal, FEV1 1.36 L 74% predicted, FVC 1.79   L 76% predicted, total lung capacity 6.19 L 128% predicted,  residual volume 79% predicted, DLCO 13.3 (59% predicted) flow volume  loop is consistent with small airways obstruction   hospitalization for hypereosinophilia, pulmonary was consulted back in the  summertime when she was hospitalized and there was no clear  pulmonary cause identified.  She was then referred to hematology who  performed a lab work-up which was unrevealing.  There was mention of a  bone marrow biopsy.  She had ANCA levels which were normal    Severe LVH previously described small LVOT gradient  Avoid dehydration                 last echo in October will need to follow up with Cardiology will likely need                Cardiac MRI  While in ER: Discussed with rheumatology at [facility] who recommended heparin Norvasc and digital Nitropaste                  CTA as well as ultrasound normal, unrevealing.                   Discussed with vascular surgery, no other recommendations.                   Heparin started as well as normal as per recommendations of  rheumatologist from [facility]  The following Work up has been ordered so far:  Orders Placed This Encounter  Procedures  . CT Angio Chest Aorta W and/or Wo Contrast  . CBC  . Comprehensive metabolic panel  . Protime-INR  . APTT  . Heparin level (unfractionated)  . Heparin level (unfractionated)  . CBC  . Consult to vascular surgery  . Consult to hospitalist  .  heparin per pharmacy consult  . Consult to hospitalist  . Type and screen [place]  . Insert peripheral IV     Following Medications were ordered in ER: Medications  iopamidol (ISOVUE-370) 76 % injection (has no administration in time range)  heparin ADULT infusion 100 units/mL (25000 units/226m sodium chloride 0.45%) (700 Units/hr Intravenous New Bag/Given 10/09/18 1824)  amLODipine (NORVASC) tablet 5 mg (5 mg Oral Given 10/09/18 1824)  HYDROmorphone (DILAUDID) injection 0.5 mg (0.5 mg Intravenous Given 10/09/18 1346)  sodium chloride 0.9 % bolus 500 mL (0 mLs Intravenous Stopped 10/09/18 1521)  HYDROmorphone (DILAUDID) injection 0.5 mg (0.5 mg Intravenous  Given 10/09/18 1529)  iopamidol (ISOVUE-370) 76 % injection 100 mL (100 mLs Intravenous Contrast Given 10/09/18 1604)  heparin bolus via infusion 3,500 Units (3,500 Units Intravenous Bolus from Bag 10/09/18 1824)    Significant initial  Findings: Abnormal Labs Reviewed  CBC - Abnormal; Notable for the following components:      Result Value   WBC 21.2 (*)    RBC 3.62 (*)    Hemoglobin 10.5 (*)    HCT 33.3 (*)    RDW 18.6 (*)    Platelets 428 (*)    All other components within normal limits  COMPREHENSIVE METABOLIC PANEL - Abnormal; Notable for the following components:   CO2 20 (*)    Glucose, Bld 105 (*)    Creatinine, Ser 1.40 (*)    Calcium 8.4 (*)    Albumin 2.2 (*)    GFR calc non Af Amer 39 (*)    GFR calc Af Amer 45 (*)    All other components within normal limits     Lactic Acid, Venous    Component Value Date/Time   LATICACIDVEN 1.48 08/06/2018 1830    Na 138 K 3.6  Cr    Up from baseline see below Lab Results  Component Value Date   CREATININE 1.40 (H) 10/09/2018   CREATININE 1.11 (H) 08/08/2018   CREATININE 1.39 (H) 08/07/2018    INR 1.16  WBC 21.2  HG/HCT Down  from baseline see below    Component Value Date/Time   HGB 10.5 (L) 10/09/2018 1330   HGB 11.7 (L) 08/14/2018 1505   HCT 33.3 (L) 10/09/2018 1330   Troponin (Point of Care Test) No results for input(s): TROPIPOC in the last 72 hours.       UA  ordered      CTA - chest no PE chronic bronchial thickening and mucous plugging in the lower lobes no infiltrate Dopplers of upper   extremity left and right unremarkable  ECG:  Not ordered   ED Triage Vitals  Enc Vitals Group     BP 10/09/18 1315 (!) 148/75     Pulse Rate 10/09/18 1315 (!) 103     Resp 10/09/18 1315 16     Temp 10/09/18 1315 98.6 F (37 C)     Temp Source 10/09/18 1315 Oral     SpO2 10/09/18 1315 99 %     Weight 10/09/18 1309 128 lb 15.5 oz (58.5 kg)     Height 10/09/18 1309 5' 2" (1.575 m)     Head  Circumference --      Peak Flow --      Pain Score 10/09/18 1309 10     Pain Loc --      Pain Edu? --      [facility]   TZOXW(96)@      Latest  Blood pressure (!) 180/71, pulse 98, temperature 98.6 F (37 C), temperature source Oral, resp. rate 18, height 5' 2" (1.575 m), weight 58.5 kg, SpO2 98 %.      ER Provider Called:  rheumatology with [facility]   Dr.Minn They Recommend use heparin, Norvasc and nitroglycerin paste  Dr. Trula Slade with surgery has consulted there is no concern for large vessel disease recommended CTA which was negative symptom management and wound care  Hospitalist was called for admission for finger gangrene in the setting of Raynaud's syndrome   Review of Systems:    Pertinent positives include:  fatigue, headaches body aches  Constitutional:  No weight loss, night sweats, Fevers, chills, weight loss  HEENT:  No headaches, Difficulty swallowing,Tooth/dental problems,Sore throat,  No sneezing, itching, ear ache, nasal congestion, post nasal drip,  Cardio-vascular:  No chest pain, Orthopnea, PND, anasarca, dizziness, palpitations.no Bilateral lower extremity swelling  GI:  No heartburn, indigestion, abdominal pain, nausea, vomiting, diarrhea, change in bowel habits, loss of appetite, melena, blood in stool, hematemesis Resp:  no shortness of breath at rest. No dyspnea on exertion, No excess mucus, no productive cough, No non-productive cough, No coughing up of blood.No change in color of mucus.No wheezing. Skin:  no rash or lesions. No jaundice GU:  no dysuria, change in color of urine, no urgency or frequency. No straining to urinate.  No flank pain.  Musculoskeletal:  No joint pain or no joint swelling. No decreased range of motion. No back pain.  Psych:  No change in mood or affect. No depression or anxiety. No memory loss.  Neuro: no localizing neurological complaints, no tingling, no weakness, no double vision, no gait abnormality, no slurred  speech, no confusion  All systems reviewed and apart from [place] all are negative  Past Medical History:   Past Medical History:  Diagnosis Date  . Anxiety   . Bipolar disorder ([place])   . CANDIDIASIS, ESOPHAGEAL 07/28/2009   Qualifier: Diagnosis of  By: [name] MD, [name]   . Chronic kidney disease    CKD III  . COPD (chronic obstructive pulmonary disease) ([place])   . Depression   . Diabetes mellitus without complication (HCC)    no meds  . FH: colonic polyps   . Fractured elbow    right   . GERD (gastroesophageal reflux disease)   . HH (hiatus hernia)   . History of carpal tunnel syndrome   . History of chest pain   . History of transfusion of packed red blood cells   . Hyperlipidemia   . Hypertension   . Neuroleptic-induced tardive dyskinesia   . Osteoarthritis of more than one site   . Seasonal allergies       Past Surgical History:  Procedure Laterality Date  . ANTERIOR CERVICAL DECOMP/DISCECTOMY FUSION  09/27/2016   C5-6 anterior cervical discectomy and fusion, allograft and plate/notes 09/27/2016  . ANTERIOR CERVICAL DECOMP/DISCECTOMY FUSION N/A 09/27/2016   Procedure: C5-6 Anterior Cervical Discectomy and Fusion, Allograft and Plate;  Surgeon: [name], MD;  Location: [place];  Service: Orthopedics;  Laterality: N/A;  . Back Fusion  2002  . BIOPSY  07/19/2018   Procedure: BIOPSY;  Surgeon: [name], MD;  Location: [place];  Service: Endoscopy;;  . CARPAL TUNNEL RELEASE  yates   left  . COLONOSCOPY N/A 01/05/2014   Procedure: COLONOSCOPY;  Surgeon: [name], MD;  Location: [facility];  Service: Endoscopy;  Laterality: N/A;  . ELBOW SURGERY     age 74  .  ESOPHAGOGASTRODUODENOSCOPY (EGD) WITH PROPOFOL N/A 07/19/2018   Procedure: ESOPHAGOGASTRODUODENOSCOPY (EGD) WITH PROPOFOL;  Surgeon: [name], MD;  Location: [place];  Service: Endoscopy;  Laterality: N/A;  . EXTERNAL EAR SURGERY Left   . EYE SURGERY     "removed white dots under eyelid"  .  FINGER SURGERY Left   . Juvara osteomy    . KNEE SURGERY    . NOSE SURGERY    . Rt. toe bunion    . skin, shave biopsy  05/03/2016   Left occipital scalp, top of scalp    Social History:  Ambulatory   Cane,     reports that she quit smoking about 16 years ago. Her smoking use included cigarettes. She has a 60.00 pack-year smoking history. She has never used smokeless tobacco. She reports that she does not drink alcohol or use drugs.     Family History:   Family History  Problem Relation Age of Onset  . Heart attack Father   . Heart disease Father   . Throat cancer Brother   . Diabetes Mother   . Hypertension Mother   . Heart attack Mother   . Diabetes type II Brother   . Heart disease Brother   . Lung cancer Brother   . Breast cancer Cousin   . Lung cancer Daughter   . Lung cancer Paternal Uncle     Allergies: Allergies  Allergen Reactions  . Prednisone Shortness Of Breath, Itching, Nausea And Vomiting and Palpitations  . Solu-Medrol [Methylprednisolone] Anaphylaxis  . Amoxicillin Hives and Rash    Has patient had a PCN reaction causing immediate rash, facial/tongue/throat swelling, SOB or lightheadedness with hypotension:Yes Has patient had a PCN reaction causing severe rash involving mucus membranes or skin necrosis: NO Has patient had a PCN reaction that required hospitalization NO Has patient had a PCN reaction occurring within the last 10 years: NO If all of the above answers are "NO", then may proceed with Cephalosporin use.   . Codeine Hives, Itching and Rash  . Hydrocodone-Acetaminophen Hives, Itching and Rash  . Penicillins Hives, Itching and Rash    ALLERGIC REACTION TO ORAL AMOXICILLIN Has patient had a PCN reaction causing immediate rash, facial/tongue/throat swelling, SOB or lightheadedness with hypotension: Yes Has patient had a PCN reaction causing severe rash involving mucus membranes or skin necrosis: No Has patient had a PCN reaction that  required hospitalization: No Has patient had a PCN reaction occurring within the last 10 years: No If all of the above answers are "NO", then may proceed with Cephalosporin use.     Prior to Admission medications   Medication Sig Start Date End Date Taking? Authorizing Provider  acetaminophen (TYLENOL) 500 MG tablet Take 2 tablets (1,000 mg total) by mouth every 6 (six) hours as needed. Patient taking differently: Take 1,000 mg by mouth every 6 (six) hours as needed for mild pain or headache.  09/13/15  Yes [name], MD  ambrisentan (LETAIRIS) 5 MG tablet Take 1 tablet (5 mg total) by mouth daily. 10/08/18  Yes Deveshwar, Abel Presto, MD  amLODipine (NORVASC) 2.5 MG tablet Take 1 tablet (2.5 mg total) by mouth daily. raynauds? Patient taking differently: Take 2.5 mg by mouth daily as needed (SBP >136). raynauds? 09/06/18  Yes [name], [name], MD  atorvastatin (LIPITOR) 20 MG tablet TAKE 1 TABLET BY MOUTH ONCE DAILY Patient taking differently: Take 20 mg by mouth daily.  06/27/18  Yes [name], [name], MD  cetirizine (ZYRTEC) 10 MG tablet Take 10  mg by mouth at bedtime.    Yes [provider]  docusate sodium (COLACE) 100 MG capsule Take 100 mg by mouth at bedtime.    Yes [provider]  DULoxetine (CYMBALTA) 30 MG capsule 3 tabs qd. Patient taking differently: Take 30 mg by mouth See admin instructions. Take one capsule (30 mg) by mouth with a 60 mg capsule daily - for a total dose of 90 mg 09/09/18  Yes Shugart, Lissa Hoard, PA-C  DULoxetine (CYMBALTA) 60 MG capsule Take 60 mg by mouth See admin instructions. Take one capsule (60 mg) by mouth with a 30 mg capsule daily - for a total dose of 90 mg   Yes [provider]  feeding supplement, ENSURE ENLIVE, (ENSURE ENLIVE) LIQD Take 237 mLs by mouth 3 (three) times daily between meals. Patient taking differently: Take 237 mLs by mouth See admin instructions. Take one bottle (237 ml) by mouth up to two times daily. 07/20/18  Yes  [name], MD  fluticasone (FLONASE) 50 MCG/ACT nasal spray Place 1-2 sprays into both nostrils at bedtime.    Yes [provider]  guaiFENesin (MUCINEX) 600 MG 12 hr tablet Take 600 mg by mouth at bedtime.   Yes [provider]  hydroxychloroquine (PLAQUENIL) 200 MG tablet Take 226m by mouth twice daily, Monday-Friday only. None on Saturday or Sunday. 10/08/18  Yes Deveshwar, SAbel Presto MD  Blood Glucose Monitoring Suppl (ACCU-CHEK NANO SMARTVIEW) W/DEVICE KIT Use to check blood sugar 1-2 times daily Patient taking differently: 1 strip by Other route 2 (two) times a week.  11/26/14   [name], WStandley Brooking MD  diclofenac sodium (VOLTAREN) 1 % GEL Apply 3 grams to 3 large joints, up to 3 times daily. Patient taking differently: Apply 1 application topically 3 (three) times daily as needed (joint pain).  01/29/18   DBo Merino MD  glucose blood test strip Accu-chek nano, test glucose once daily, dx E11.9 09/06/18   [name], WStandley Brooking MD  ipratropium-albuterol (DUONEB) 0.5-2.5 (3) MG/3ML SOLN INHALE CONTENTS OF 1 VIAL THREE TIMES A DAY IN NEBULIZER Patient taking differently: Take 3 mLs by nebulization 3 (three) times daily.  05/06/18   MJuanito Doom MD  lamoTRIgine (LAMICTAL) 25 MG tablet Take 1 tablet (25 mg total) by mouth 2 (two) times daily. 09/09/18   Shugart, CLissa Hoard PA-C  Lancets Misc. (ACCU-CHEK FASTCLIX LANCET) KIT USE TO CHECK BLOOD SUGAR 1-2 TIMES DAILY Dx E11.9 09/06/18   [name], WStandley Brooking MD  linaclotide (LINZESS) 145 MCG CAPS capsule Take 145 mcg by mouth daily as needed (constipation).    [provider]  losartan (COZAAR) 50 MG tablet Take 1 tablet (50 mg total) by mouth daily. 10/04/18   [name], WStandley Brooking MD  montelukast (SINGULAIR) 10 MG tablet TAKE 1 TABLET BY MOUTH AT BEDTIME Patient taking differently: Take 10 mg by mouth at bedtime.  08/19/18   MJuanito Doom MD  Multiple Vitamin (MULTIVITAMIN WITH MINERALS) TABS tablet Take 1 tablet by mouth  daily. Centrum Silver    [provider]  nitroGLYCERIN (NITROGLYN) 2 % ointment Apply 1/4 inch topically to affected fingertips three times daily. 10/08/18   DBo Merino MD  NON FORMULARY Respivest: Three times a day for 30 minutes    [provider]  ondansetron (ZOFRAN-ODT) 4 MG disintegrating tablet DISSOLVE 1 TABLET(4 MG) ON THE TONGUE EVERY 8 HOURS AS NEEDED FOR NAUSEA OR VOMITING Patient taking differently: Take 4 mg by mouth every 8 (eight) hours as needed for  nausea or vomiting.  08/13/18   [name], [name], MD  OVER THE COUNTER MEDICATION Take 1 capsule by mouth at bedtime. Fdgard    [provider]  pantoprazole (PROTONIX) 40 MG tablet Take 40 mg by mouth daily.    [provider]  Probiotic Product (ALIGN PO) Take 1 capsule by mouth daily with breakfast.     [provider]  Respiratory Therapy Supplies (FLUTTER) DEVI 1 Device by Does not apply route as directed. Patient taking differently: 1 Device as directed.  10/24/17   Parrett, Fonnie Mu, NP  sodium chloride HYPERTONIC 3 % nebulizer solution Take by nebulization 2 (two) times daily. Patient taking differently: Take 4 mLs by nebulization 2 (two) times daily.  01/18/18   [name], MD  triamcinolone cream (KENALOG) 0.1 % Apply 1 application topically daily as needed (itching).  09/26/18   [provider]  umeclidinium-vilanterol (ANORO ELLIPTA) 62.5-25 MCG/INH AEPB Inhale 1 puff into the lungs daily. 07/26/18   [name], MD   Physical Exam: Blood pressure (!) 180/71, pulse 98, temperature 98.6 F (37 C), temperature source Oral, resp. rate 18, height 5' 2" (1.575 m), weight 58.5 kg, SpO2 98 %. 1. General:  in No Acute distress  Chronically ill  -appearing 2. Psychological: Alert and  Oriented 3. Head/ENT:   Moist   Mucous Membranes                          Head Non traumatic, neck supple                           Poor Dentition 4. SKIN:   decreased Skin  turgor,  Skin clean Dry darkening of the skin of the digits noted with loss of a fingertip on the third digit peripheral pulses bilaterally 5. Heart: Regular rate and rhythm no  Murmur, no Rub or gallop 6. Lungs:  no wheezes some fine  crackles   7. Abdomen: Soft,  RLQ tender (chronic), Non distended   bowel sounds present 8. Lower extremities: no clubbing, cyanosis, or trace ankle edema 9. Neurologically Grossly intact, moving all 4 extremities equally 10. MSK: Normal range of motion   LABS:     Recent Labs  Lab 10/08/18 1535 10/09/18 1330  WBC 19.7* 21.2*  NEUTROABS 5,299  --   HGB 10.2* 10.5*  HCT 31.7* 33.3*  MCV 88.3 92.0  PLT 443* 027*   Basic Metabolic Panel: Recent Labs  Lab 10/09/18 1330  NA 138  K 3.6  CL 108  CO2 20*  GLUCOSE 105*  BUN 16  CREATININE 1.40*  CALCIUM 8.4*      Recent Labs  Lab 10/09/18 1330  AST 24  ALT 15  ALKPHOS 92  BILITOT 0.7  PROT 7.1  ALBUMIN 2.2*   No results for input(s): LIPASE, AMYLASE in the last 168 hours. No results for input(s): AMMONIA in the last 168 hours.    HbA1C: No results for input(s): HGBA1C in the last 72 hours. CBG: No results for input(s): GLUCAP in the last 168 hours.    Urine analysis:    Component Value Date/Time   COLORURINE YELLOW 08/06/2018 1932   APPEARANCEUR CLEAR 08/06/2018 1932   LABSPEC >1.046 (H) 08/06/2018 1932   PHURINE 6.0 08/06/2018 1932   GLUCOSEU NEGATIVE 08/06/2018 [place] NEGATIVE 08/06/2018 1932   HGBUR negative 03/11/2008 [place] 08/06/2018 1932  BILIRUBINUR n 08/07/2017 1558   KETONESUR 5 (A) 08/06/2018 1932   PROTEINUR 100 (A) 08/06/2018 1932   UROBILINOGEN 1.0 08/07/2017 1558   UROBILINOGEN 0.2 08/16/2014 1513   NITRITE NEGATIVE 08/06/2018 1932   LEUKOCYTESUR NEGATIVE 08/06/2018 1932       Cultures:    Component Value Date/Time   SDES  08/07/2018 0845    BLOOD RIGHT ARM Performed at [facility], [place] [address], [place], [place] [address]    SDES  08/07/2018 0845    BLOOD RIGHT HAND Performed at [facility], [place] [address], [place], [place] [address]    SPECREQUEST  08/07/2018 0845    BOTTLES DRAWN AEROBIC AND ANAEROBIC Blood Culture adequate volume Performed at [place] [address] [place], [place] [address]    SPECREQUEST  08/07/2018 0845    BOTTLES DRAWN AEROBIC ONLY Blood Culture adequate volume Performed at [place] [address], [place], [place] [address]    CULT  08/07/2018 0845    NO GROWTH 5 DAYS Performed at [place] [address] [place], [place] [address]    CULT  08/07/2018 0845    NO GROWTH 5 DAYS Performed at [facility], [place] [address] [place],  [address]    REPTSTATUS 08/12/2018 FINAL 08/07/2018 0845   REPTSTATUS 08/12/2018 FINAL 08/07/2018 0845     Radiological Exams on Admission: Ct Angio Chest Aorta W And/or Wo Contrast  Result Date: 10/09/2018 CLINICAL DATA:  Discoloration with history of Raynaud's disease EXAM: CT ANGIOGRAPHY CHEST WITH CONTRAST TECHNIQUE: Multidetector CT imaging of the chest was performed using the standard protocol during bolus administration of intravenous contrast. Multiplanar CT image reconstructions and MIPs were obtained to evaluate the vascular anatomy. CONTRAST:  19m ISOVUE-370 IOPAMIDOL (ISOVUE-370) INJECTION 76% COMPARISON:  08/06/2018 chest x-ray, CT from 07/11/2018 FINDINGS: Cardiovascular: Thoracic aorta demonstrates a normal branching pattern. Mild atherosclerotic calcifications are seen. No aneurysmal dilatation is seen. No ulcerated plaque is identified to suggest embolic source. The visualized brachiocephalic vessels are within normal limits. Pulmonary artery is well visualized and demonstrates no filling defect to suggest pulmonary embolism. Mediastinum/Nodes: Thoracic inlet is within normal limits. No sizable mediastinal adenopathy  is noted. Symmetrical hilar lymph nodes are seen likely reactive in nature. In retrospect these are stable from a prior CT examination. The esophagus is within normal limits. Lungs/Pleura: Bronchial thickening and areas of mucous plugging are identified particularly in the lower lobes. These changes are stable from the prior exam. Previously seen infiltrate in the right lower lobe has resolved in the interval. No sizable effusion or pneumothorax is seen. No sizable nodule is noted. Mild dependent atelectatic changes are noted. Upper Abdomen: Visualized upper abdomen is within normal limits. Musculoskeletal: Degenerative changes of the thoracic spine are seen. No acute bony abnormality is noted. Review of the MIP images confirms the above findings. IMPRESSION: No specific aortic abnormality to correspond with the given clinical history of embolic change is seen. No evidence of pulmonary emboli. Chronic bronchial thickening and mucous plugging particularly in the lower lobes with some reactive lymphadenopathy stable from the prior exam. Resolution of right lower lobe infiltrate. Aortic Atherosclerosis (ICD10-I70.0). Electronically Signed   By: MInez CatalinaM.D.   On: 10/09/2018 16:31   Vas UKoreaUpper Extremity Arterial Duplex  Result Date: 10/09/2018 UPPER EXTREMITY DUPLEX STUDY Indications: Gangrene of bilateral fingers and upper extremities' coldness.  Limitations: Body habitus Comparison Study: No priors available Performing Technologist:  Rudell Cobb  Examination Guidelines: A complete evaluation includes B-mode imaging, spectral Doppler, color Doppler, and power Doppler as needed of all accessible portions of each vessel. Bilateral testing is considered an integral part of a complete examination. Limited examinations for reoccurring indications may be performed as noted.  Right Doppler Findings: +----------+----------+---------+------+--------+ Site      PSV (cm/s)Waveform PlaqueComments  +----------+----------+---------+------+--------+ [id number]       triphasic               +----------+----------+---------+------+--------+ Brachial  107       triphasic               +----------+----------+---------+------+--------+ Radial    90        triphasic               +----------+----------+---------+------+--------+ Ulnar     112       triphasic               +----------+----------+---------+------+--------+ No obvious plaque or occlusion seen Left Doppler Findings: +--------+----------+---------+------+--------+ Site    PSV (cm/s)Waveform PlaqueComments +--------+----------+---------+------+--------+ [id number]       triphasic               +--------+----------+---------+------+--------+ Radial  110       triphasic               +--------+----------+---------+------+--------+ Ulnar   62        triphasic               +--------+----------+---------+------+--------+ No obvious plaque or occlusion seen  Summary:  Right: No obstruction visualized in the right upper extremity. Left: No obstruction visualized in the left upper extremity. *See table(s) above for measurements and observations.    Preliminary     Chart has been reviewed    Assessment/Plan  67 y.o. female with medical history significant of Raynaud's disease with gangrene Bronchiectasis  polyarthritis with a positive rheumatoid factor.GERD and hypertrophic cardiomyopathy  hyper eosinophilia bipolar disorder, CKD stage III, COPD, depression, DM 2, HTN Admitted for finger gangrene  Present on Admission: . Raynaud's phenomenon with gangrene (HCC)-we will need rheumatology consult to help manage.  Patient likely has underlying rheumatological disorder clinically similar to scleroderma continue hydroxychloroquine   . Gangrene of finger (HCC) -initiated heparin drip, continue Norvasc, nitroglycerin paste to the fingertips continue warm pockets to fingers for comfort.  Rheumatology  consult in a.m.  continue ambrisentan   . Eosinophilia chronic patient was supposed to follow-up with oncology  Negative for spondylitis, Aspergillus, ANCA   IgE markedly elevated, Flow cytometry no abnormalities   PD GFR alpha: Negative  Serum tryptase 21.8 elevated suggestive of mast cell disorder.    Negative PD GFR alpha rules out hypereosinophilic syndrome  Recommended: Bone marrow biopsy in November  . Essential hypertension  -on Norvasc . GERD -stable continue home medications . Diabetes mellitus with renal manifestations, controlled ([place]) -  - Order Sensitive   SSI   -  check TSH and HgA1C     . Bipolar affective disorder, currently depressed, moderate  (HCC)stable continue home medications . Hyperlipidemia  -stable we will continue Lipitor . Leucocytosis -mainly eosinophilia neutrophil count within normal limits  LVH - avoid dehydration would benefit from cardiology follow-up may need cardiac MRI in the future Other plan as per orders.  DVT prophylaxis:  heparin    Code Status:  FULL CODE  as per patient   I had personally discussed CODE STATUS with patient    Family Communication:  Family not at  Bedside    Disposition Plan:        To home once workup is complete and patient is stable                     Would benefit from PT/OT eval prior to DC  Ordered                                      Wound care  consulted                                    Consults called: Rhe[facility]ology at [facility], please call Dr.Deveshwar  in a.m. for further management suggestions  Admission status:   inpatient     Expect 2 midnight stay secondary to severity of patient's current illness including     Severe lab/radiological abnormalities including:   Elevated white blood cell count and extensive comorbidities including: Rheumatologic disease  DM2        COPD bronchiectasis   CKD   That are currently affecting medical management.  I expect  patient to be hospitalized for 2  midnights requiring inpatient medical care.  Patient is at high risk for adverse outcome (such as loss of life or disability) if not treated.  Indication for inpatient stay as follows:     severe pain requiring acute inpatient management,    Possible need for operative/procedural  intervention need for wound care Need for IV antibiotics, IV fluids,  IV medications     Level of care    medical floor            10/09/2018, 11:05 PM    [facility]  Pager [phone]   after 2 AM please page floor coverage PA If 7AM-7PM, please contact the day team taking care of the patient  [email or web address]  Password [email or web address]
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
## 2018-10-09 NOTE — ED Provider Notes (Signed)
  Physical Exam  BP (!) 168/76   Pulse 83   Temp 98.6 F (37 C) (Oral)   Resp 16   Ht 5\' 2"  (1.575 m)   Wt 58.5 kg   SpO2 94%   BMI 23.59 kg/m   Physical Exam  ED Course/Procedures     Procedures  MDM  Patient with Raynaud's and finger ischemia.  Discussed with Dr. from rheumatology at [facility].  Also discussed with admitting hospitalist there.  No beds available for 24 to 48 hours.  However rheumatology after further discussion thinks patient may not need to transfer there for inpatient neurology consultation.  Needs heparin Norvasc and digital Nitropaste.  She can be reached by telephone if needed or potentially Dr. [facility], the patient's primary rheumatologist could be reached.  Will now admit to hospitalist.       Corliss Skains, MD 10/09/18 1827

## 2018-10-09 NOTE — ED Notes (Signed)
Pt takes 500mg  x2 of own Tylenol. MD aware.

## 2018-10-09 NOTE — Telephone Encounter (Signed)
Patient called stating Dr. Corliss Skains sent in prescriptions to [facility] which she will be picking up this afternoon.  Patient states she has an appointment on Friday 10/11/18 and is checking if Dr. Corliss Skains wants her to keep that appointment if she has not been able to take her medication.  Patient requested a return call.

## 2018-10-09 NOTE — ED Notes (Signed)
Heparin verified with Crystal RN 

## 2018-10-09 NOTE — Progress Notes (Signed)
Bilateral upper extremities arterial duplex exam completed. Please see preliminary notes on CV PROC under chart review. Desiree [name] H Ayasha Ellingsen(RDMS RVT) 10/09/18 3:12 PM'
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
## 2018-10-09 NOTE — ED Notes (Signed)
Patient transported to CT 

## 2018-10-10 ENCOUNTER — Other Ambulatory Visit: Payer: Self-pay

## 2018-10-10 ENCOUNTER — Other Ambulatory Visit: Payer: Self-pay | Admitting: *Deleted

## 2018-10-10 ENCOUNTER — Encounter (HOSPITAL_COMMUNITY): Payer: Self-pay | Admitting: General Practice

## 2018-10-10 LAB — COMPREHENSIVE METABOLIC PANEL
ALT: 12 U/L (ref 0–44)
AST: 21 U/L (ref 15–41)
Albumin: 1.9 g/dL — ABNORMAL LOW (ref 3.5–5.0)
Alkaline Phosphatase: 89 U/L (ref 38–126)
Anion gap: 6 (ref 5–15)
BUN: 14 mg/dL (ref 8–23)
CO2: 24 mmol/L (ref 22–32)
Calcium: 7.9 mg/dL — ABNORMAL LOW (ref 8.9–10.3)
Chloride: 108 mmol/L (ref 98–111)
Creatinine, Ser: 1.26 mg/dL — ABNORMAL HIGH (ref 0.44–1.00)
GFR calc Af Amer: 51 mL/min — ABNORMAL LOW (ref >60)
GFR calc non Af Amer: 44 mL/min — ABNORMAL LOW (ref >60)
Glucose, Bld: 107 mg/dL — ABNORMAL HIGH (ref 70–99)
Potassium: 3.6 mmol/L (ref 3.5–5.1)
Sodium: 138 mmol/L (ref 135–145)
Total Bilirubin: 0.4 mg/dL (ref 0.3–1.2)
Total Protein: 6.1 g/dL — ABNORMAL LOW (ref 6.5–8.1)

## 2018-10-10 LAB — CBC
HCT: 30 % — ABNORMAL LOW (ref 36.0–46.0)
Hemoglobin: 9.8 g/dL — ABNORMAL LOW (ref 12.0–15.0)
MCH: 29 pg (ref 26.0–34.0)
MCHC: 32.7 g/dL (ref 30.0–36.0)
MCV: 88.8 fL (ref 80.0–100.0)
Platelets: 387 10*3/uL (ref 150–400)
RBC: 3.38 MIL/uL — ABNORMAL LOW (ref 3.87–5.11)
RDW: 18.3 % — ABNORMAL HIGH (ref 11.5–15.5)
WBC: 25.4 10*3/uL — ABNORMAL HIGH (ref 4.0–10.5)
nRBC: 0 % (ref 0.0–0.2)

## 2018-10-10 LAB — GLUCOSE, CAPILLARY
Glucose-Capillary: 86 mg/dL (ref 70–99)
Glucose-Capillary: 75 mg/dL (ref 70–99)
Glucose-Capillary: 87 mg/dL (ref 70–99)
Glucose-Capillary: 105 mg/dL — ABNORMAL HIGH (ref 70–99)

## 2018-10-10 LAB — HEPARIN LEVEL (UNFRACTIONATED)
Heparin Unfractionated: 0.37 IU/mL (ref 0.30–0.70)
Heparin Unfractionated: 0.24 IU/mL — ABNORMAL LOW (ref 0.30–0.70)
Heparin Unfractionated: 0.36 IU/mL (ref 0.30–0.70)

## 2018-10-10 LAB — TSH: TSH: 2.153 u[IU]/mL (ref 0.350–4.500)

## 2018-10-10 LAB — HEMOGLOBIN A1C
Hgb A1c MFr Bld: 4.9 % (ref 4.8–5.6)
Mean Plasma Glucose: 93.93 mg/dL

## 2018-10-10 LAB — MAGNESIUM: Magnesium: 1.8 mg/dL (ref 1.7–2.4)

## 2018-10-10 LAB — PHOSPHORUS: Phosphorus: 3.4 mg/dL (ref 2.5–4.6)

## 2018-10-10 MED ORDER — ASPIRIN EC 81 MG PO TBEC
81.0000 mg | DELAYED_RELEASE_TABLET | Freq: Every day | ORAL | Status: DC
  Administered 2018-10-10 – 2018-10-16 (×7): 81 mg via ORAL
  Filled 2018-10-10 (×7): qty 1

## 2018-10-10 MED ORDER — ACETAMINOPHEN 325 MG PO TABS
650.0000 mg | ORAL_TABLET | Freq: Once | ORAL | Status: AC
  Administered 2018-10-10: 650 mg via ORAL
  Filled 2018-10-10: qty 2

## 2018-10-10 MED ORDER — IPRATROPIUM-ALBUTEROL 0.5-2.5 (3) MG/3ML IN SOLN
3.0000 mL | Freq: Two times a day (BID) | RESPIRATORY_TRACT | Status: DC
  Administered 2018-10-10 – 2018-10-12 (×5): 3 mL via RESPIRATORY_TRACT
  Filled 2018-10-10 (×5): qty 3

## 2018-10-10 MED ORDER — AMLODIPINE BESYLATE 5 MG PO TABS
5.0000 mg | ORAL_TABLET | Freq: Once | ORAL | Status: AC
  Administered 2018-10-10: 5 mg via ORAL
  Filled 2018-10-10: qty 1

## 2018-10-10 MED ORDER — AMLODIPINE BESYLATE 10 MG PO TABS
10.0000 mg | ORAL_TABLET | Freq: Every day | ORAL | Status: DC
  Administered 2018-10-15: 10 mg via ORAL
  Filled 2018-10-10 (×6): qty 1

## 2018-10-10 MED ORDER — MORPHINE SULFATE (PF) 2 MG/ML IV SOLN
2.0000 mg | INTRAVENOUS | Status: DC | PRN
  Administered 2018-10-10 – 2018-10-11 (×5): 2 mg via INTRAVENOUS
  Filled 2018-10-10 (×5): qty 1

## 2018-10-10 NOTE — Patient Outreach (Signed)
[facility]) Care Management  10/10/2018  RONNELL MAKAREWICZ June 04, 1951 [id number]   Noted that member presented to the ED and was admitted for Raynaud's disease with gangrene.  Hospital liaisons aware of admission, will follow up with member after discharge.  Kemper Durie, [place], MSN [facility] Care Management  [facility] Manager [phone]

## 2018-10-10 NOTE — Progress Notes (Signed)
ANTICOAGULATION CONSULT NOTE  Pharmacy Consult:  Heparin Indication: Raynaud's  Allergies  Allergen Reactions  . Prednisone Shortness Of Breath, Itching, Nausea And Vomiting and Palpitations  . Solu-Medrol [Methylprednisolone] Anaphylaxis  . Amoxicillin Hives and Rash    Has patient had a PCN reaction causing immediate rash, facial/tongue/throat swelling, SOB or lightheadedness with hypotension:Yes Has patient had a PCN reaction causing severe rash involving mucus membranes or skin necrosis: No Has patient had a PCN reaction that required hospitalization: No Has patient had a PCN reaction occurring within the last 10 years: No If all of the above answers are "NO", then may proceed with Cephalosporin use.   . Codeine Hives, Itching and Rash  . Hydrocodone-Acetaminophen Hives, Itching and Rash  . Penicillins Hives, Itching and Rash    ALLERGIC REACTION TO ORAL AMOXICILLIN Has patient had a PCN reaction causing immediate rash, facial/tongue/throat swelling, SOB or lightheadedness with hypotension: Yes Has patient had a PCN reaction causing severe rash involving mucus membranes or skin necrosis: No Has patient had a PCN reaction that required hospitalization: No Has patient had a PCN reaction occurring within the last 10 years: No If all of the above answers are "NO", then may proceed with Cephalosporin use.    Patient Measurements: Height: 5\' 3"  (160 cm) Weight: 130 lb 8.2 oz (59.2 kg) IBW/kg (Calculated) : 52.4 Heparin Dosing Weight: 59 kg  Vital Signs: Temp: 98.7 F (37.1 C) (12/19 1505) Temp Source: Oral (12/19 1505) BP: 118/56 (12/19 1505) Pulse Rate: 90 (12/19 1505)  Labs: Recent Labs    10/08/18 1535 10/09/18 1330 10/10/18 0125 10/10/18 0740 10/10/18 1613  HGB 10.2* 10.5* 9.8*  --   --   HCT 31.7* 33.3* 30.0*  --   --   PLT 443* 428* 387  --   --   APTT  --  36  --   --   --   LABPROT  --  14.7  --   --   --   INR  --  1.16  --   --   --   HEPARINUNFRC  --    --  0.37 0.24* 0.36  CREATININE  --  1.40* 1.26*  --   --     Estimated Creatinine Clearance: 36.3 mL/min (A) (by C-G formula based on SCr of 1.26 mg/dL (H)).    Assessment: 23 YOF presented to the ED with hand pain.  Pharmacy consulted to dose IV heparin per [facility] MD recommendations for Raynaud's.   Heparin level therapeutic this PM  Goal of Therapy:  Heparin level 0.3-0.7 units/ml Monitor platelets by anticoagulation protocol: Yes   Plan:  Continue heparin at 800 units / hr Daily heparin level and CBC F/U LOT  Thank you [facility], PharmD [phone]  10/10/2018, 5:28 PM

## 2018-10-10 NOTE — Progress Notes (Signed)
ANTICOAGULATION CONSULT NOTE  Pharmacy Consult:  Heparin Indication: Raynaud's  Allergies  Allergen Reactions  . Prednisone Shortness Of Breath, Itching, Nausea And Vomiting and Palpitations  . Solu-Medrol [Methylprednisolone] Anaphylaxis  . Amoxicillin Hives and Rash    Has patient had a PCN reaction causing immediate rash, facial/tongue/throat swelling, SOB or lightheadedness with hypotension:Yes Has patient had a PCN reaction causing severe rash involving mucus membranes or skin necrosis: No Has patient had a PCN reaction that required hospitalization: No Has patient had a PCN reaction occurring within the last 10 years: No If all of the above answers are "NO", then may proceed with Cephalosporin use.   . Codeine Hives, Itching and Rash  . Hydrocodone-Acetaminophen Hives, Itching and Rash  . Penicillins Hives, Itching and Rash    ALLERGIC REACTION TO ORAL AMOXICILLIN Has patient had a PCN reaction causing immediate rash, facial/tongue/throat swelling, SOB or lightheadedness with hypotension: Yes Has patient had a PCN reaction causing severe rash involving mucus membranes or skin necrosis: No Has patient had a PCN reaction that required hospitalization: No Has patient had a PCN reaction occurring within the last 10 years: No If all of the above answers are "NO", then may proceed with Cephalosporin use.    Patient Measurements: Height: 5\' 3"  (160 cm) Weight: 130 lb 8.2 oz (59.2 kg) IBW/kg (Calculated) : 52.4 Heparin Dosing Weight: 59 kg  Vital Signs: Temp: 98.5 F (36.9 C) (12/19 0531) Temp Source: Oral (12/19 0531) BP: 136/88 (12/19 0531) Pulse Rate: 96 (12/19 0531)  Labs: Recent Labs    10/08/18 1535 10/09/18 1330 10/10/18 0125 10/10/18 0740  HGB 10.2* 10.5* 9.8*  --   HCT 31.7* 33.3* 30.0*  --   PLT 443* 428* 387  --   APTT  --  36  --   --   LABPROT  --  14.7  --   --   INR  --  1.16  --   --   HEPARINUNFRC  --   --  0.37 0.24*  CREATININE  --  1.40* 1.26*   --     Estimated Creatinine Clearance: 36.3 mL/min (A) (by C-G formula based on SCr of 1.26 mg/dL (H)).    Assessment: 31 YOF presented to the ED with hand pain.  Pharmacy consulted to dose IV heparin per [facility] MD recommendations for Raynaud's.   Heparin level is sub-therapeutic this AM.  No bleeding reported.  Goal of Therapy:  Heparin level 0.3-0.7 units/ml Monitor platelets by anticoagulation protocol: Yes   Plan:  Increase heparin gtt to 800 units/hr Check 6 hr heparin level Daily heparin level and CBC F/U LOT   Ronnae Kaser D. 11-29-1981, PharmD, BCPS, BCCCP 10/10/2018, 8:53 AM

## 2018-10-10 NOTE — Progress Notes (Signed)
ANTICOAGULATION CONSULT NOTE - Follow Up Consult  Pharmacy Consult for heparin Indication: Raynaud's  Labs: Recent Labs    10/08/18 1535 10/09/18 1330 10/10/18 0125  HGB 10.2* 10.5* 9.8*  HCT 31.7* 33.3* 30.0*  PLT 443* 428* 387  APTT  --  36  --   LABPROT  --  14.7  --   INR  --  1.16  --   HEPARINUNFRC  --   --  0.37  CREATININE  --  1.40*  --     Assessment/Plan:  67yo female therapeutic on heparin with initial dosing for Raynaud's. Will continue gtt at current rate and confirm stable with additional level.     Vernard Gambles, PharmD, BCPS  10/10/2018,2:18 AM

## 2018-10-10 NOTE — Progress Notes (Addendum)
PROGRESS NOTE    Desiree [name]  ENI:[id number] DOB: 1951/05/13 DOA: 10/09/2018 PCP: Madelin Headings, MD   Brief Narrative:  67 year old with history of Raynaud's disease with gangrene bronchiectasis, polyarthritis with positive rheumatoid factor, GERD, hypertrophic cardiomyopathy, bipolar disorder, CKD stage III, COPD, depression, diabetes mellitus type 2 came to the hospital with worsening of the pain in her bilateral fingers concerning for Raynaud's phenomenon.  Case was discussed by the admitting provider with rheumatology at [facility] who recommended heparin, Norvasc and digital Nitropaste.  Also discussed with vascular surgery who did not have any further recommendations as they thought patient was getting tx to [facility], but will notify that patient is staying.   Assessment & Plan:   Active Problems:   Eosinophilia   Essential hypertension   GERD   Renal insufficiency   Diabetes mellitus with renal manifestations, controlled (HCC)   Bipolar affective disorder, currently depressed, moderate (HCC)   Hyperlipidemia   Bronchiectasis without complication (HCC)   Leucocytosis   Gangrene of finger (HCC)   Raynaud's phenomenon with gangrene (HCC)   LVH (left ventricular hypertrophy)   Bilateral digital pain secondary to Raynaud's phenomenon with gangrene -Follows with outpatient Dr. Corliss Skains from Rheumatology.  Continue hydroxychloroquine -Increase Norvasc to 10 mg daily, continue heparin drip, nitroglycerin paste on fingertips, 1 pockets -Stop ARB  CTA chest - no acute findings in the chest.  Upper Ext Arterial Doppler= No obstruction seen.   I have messaged Dr Corliss Skains, outpatient Rheum, via inbox for any further assistance she is able to provide, other once we can get pain and her symptoms under control. She should be able to follow up outpatient for rest of the care.   Essential hypertension -Currently on Norvasc, ARB, topical nitro  Eosinophilia, chronic -Follow-up  outpatient with oncology  GERD -PPI  Insulin-dependent diabetes mellitus with renal manifestation - Insulin sliding scale and Accu-Cheks  Bipolar disorder -Cymbalta 90 mg daily  Hyperlipidemia - Atorvastatin 20 mg daily  DVT prophylaxis: Heparin drip Code Status: Full code Family Communication: None at bedside Disposition Plan: Maintain inpatient stay until her digital pain is improved.  UPDATE 515PM: Dr [name] (rheum) replied with her input. Recommend Norvasc 10mg  TID and Nitro Dur if BP tolerates, Ok to d/c ARB (this will further help BP to add other meds). If no concerns for Thrombosis - then Hep drip can be stopped.  Vascular team here recommended catheter based Angio study. I spoke with Dr , who recommends touch base with Dr Darrick Penna in the morning since she is stable but in the meantime will keep her NPO PMN for this tomorrow.  ASA 81mg  po daily EC.  Appreciate her input. For now will discontinue ARB. Slowly uptitrate CCB.   Consultants:   [facility] per admitting provider= Rec- Hep Drip, Norvasc and Nitro paste  Vasc Surgery- no further recs    Subjective: Reports of b/l finger pain and slightly better from yesterday.   Review of Systems Otherwise negative except as per HPI, including: General = no fevers, chills, dizziness, malaise, fatigue HEENT/EYES = negative for pain, redness, loss of vision, double vision, blurred vision, loss of hearing, sore throat, hoarseness, dysphagia Cardiovascular= negative for chest pain, palpitation, murmurs, lower extremity swelling Respiratory/lungs= negative for shortness of breath, cough, hemoptysis, wheezing, mucus production Gastrointestinal= negative for nausea, vomiting,, abdominal pain, melena, hematemesis Genitourinary= negative for Dysuria, Hematuria, Change in Urinary Frequency MSK = Negative for arthralgia, myalgias, Back Pain, Joint swelling  Neurology= Negative for headache, seizures, numbness,  tingling  Psychiatry= Negative for anxiety, depression, suicidal and homocidal ideation Allergy/Immunology= Medication/Food allergy as listed  Skin= Negative for Rash, lesions, ulcers, itching   Objective: Vitals:   10/09/18 2213 10/09/18 2243 10/10/18 0531 10/10/18 0819  BP: (!) 148/82  136/88   Pulse: 98  96   Resp: 14  17   Temp: 100.2 F (37.9 C)  98.5 F (36.9 C)   TempSrc: Oral  Oral   SpO2: 98% 97% 98% 95%  Weight: 59.2 kg     Height: 5\' 3"  (1.6 m)       Intake/Output Summary (Last 24 hours) at 10/10/2018 1353 Last data filed at 10/10/2018 1300 Gross per 24 hour  Intake 720 ml  Output -  Net 720 ml   Filed Weights   10/09/18 1309 10/09/18 2213  Weight: 58.5 kg 59.2 kg    Examination: Constitutional: NAD, calm, comfortable Eyes: PERRL, lids and conjunctivae normal ENMT: Mucous membranes are moist. Posterior pharynx clear of any exudate or lesions.Normal dentition.  Neck: normal, supple, no masses, no thyromegaly Respiratory: clear to auscultation bilaterally, no wheezing, no crackles. Normal respiratory effort. No accessory muscle use.  Cardiovascular: Regular rate and rhythm, no murmurs / rubs / gallops. No extremity edema. 2+ pedal pulses. No carotid bruits.  Abdomen: no tenderness, no masses palpated. No hepatosplenomegaly. Bowel sounds positive.  Musculoskeletal: no clubbing / cyanosis. No joint deformity upper and lower extremities. Good ROM, no contractures. Normal muscle tone.  Skin: no rashes, lesions, ulcers. No induration Neurologic: CN 2-12 grossly intact. Sensation intact, DTR normal. Strength 5/5 in all 4.  Psychiatric: Normal judgment and insight. Alert and oriented x 3. Normal mood.     Data Reviewed:   CBC: Recent Labs  Lab 10/08/18 1535 10/09/18 1330 10/10/18 0125  WBC 19.7* 21.2* 25.4*  NEUTROABS 5,299  --   --   HGB 10.2* 10.5* 9.8*  HCT 31.7* 33.3* 30.0*  MCV 88.3 92.0 88.8  PLT 443* 428* 387   Basic Metabolic Panel: Recent  Labs  Lab 10/09/18 1330 10/10/18 0125  NA 138 138  K 3.6 3.6  CL 108 108  CO2 20* 24  GLUCOSE 105* 107*  BUN 16 14  CREATININE 1.40* 1.26*  CALCIUM 8.4* 7.9*  MG  --  1.8  PHOS  --  3.4   GFR: Estimated Creatinine Clearance: 36.3 mL/min (A) (by C-G formula based on SCr of 1.26 mg/dL (H)). Liver Function Tests: Recent Labs  Lab 10/08/18 1535 10/09/18 1330 10/10/18 0125  AST  --  24 21  ALT  --  15 12  ALKPHOS  --  92 89  BILITOT  --  0.7 0.4  PROT 7.2 7.1 6.1*  ALBUMIN  --  2.2* 1.9*   No results for input(s): LIPASE, AMYLASE in the last 168 hours. No results for input(s): AMMONIA in the last 168 hours. Coagulation Profile: Recent Labs  Lab 10/09/18 1330  INR 1.16   Cardiac Enzymes: No results for input(s): CKTOTAL, CKMB, CKMBINDEX, TROPONINI in the last 168 hours. BNP (last 3 results) No results for input(s): PROBNP in the last 8760 hours. HbA1C: Recent Labs    10/10/18 0125  HGBA1C 4.9   CBG: Recent Labs  Lab 10/09/18 2246 10/10/18 0614 10/10/18 1102  GLUCAP 94 86 75   Lipid Profile: No results for input(s): CHOL, HDL, LDLCALC, TRIG, CHOLHDL, LDLDIRECT in the last 72 hours. Thyroid Function Tests: Recent Labs    10/10/18 0125  TSH 2.153   Anemia Panel: No results  for input(s): VITAMINB12, FOLATE, FERRITIN, TIBC, IRON, RETICCTPCT in the last 72 hours. Sepsis Labs: No results for input(s): PROCALCITON, LATICACIDVEN in the last 168 hours.  No results found for this or any previous visit (from the past 240 hour(s)).       Radiology Studies: Ct Angio Chest Aorta W And/or Wo Contrast  Result Date: 10/09/2018 CLINICAL DATA:  Discoloration with history of Raynaud's disease EXAM: CT ANGIOGRAPHY CHEST WITH CONTRAST TECHNIQUE: Multidetector CT imaging of the chest was performed using the standard protocol during bolus administration of intravenous contrast. Multiplanar CT image reconstructions and MIPs were obtained to evaluate the vascular  anatomy. CONTRAST:  ISOVUE-370 IOPAMIDOL (ISOVUE-370) INJECTION 76% COMPARISON:  08/06/2018 chest x-ray, CT from 07/11/2018 FINDINGS: Cardiovascular: Thoracic aorta demonstrates a normal branching pattern. Mild atherosclerotic calcifications are seen. No aneurysmal dilatation is seen. No ulcerated plaque is identified to suggest embolic source. The visualized brachiocephalic vessels are within normal limits. Pulmonary artery is well visualized and demonstrates no filling defect to suggest pulmonary embolism. Mediastinum/Nodes: Thoracic inlet is within normal limits. No sizable mediastinal adenopathy is noted. Symmetrical hilar lymph nodes are seen likely reactive in nature. In retrospect these are stable from a prior CT examination. The esophagus is within normal limits. Lungs/Pleura: Bronchial thickening and areas of mucous plugging are identified particularly in the lower lobes. These changes are stable from the prior exam. Previously seen infiltrate in the right lower lobe has resolved in the interval. No sizable effusion or pneumothorax is seen. No sizable nodule is noted. Mild dependent atelectatic changes are noted. Upper Abdomen: Visualized upper abdomen is within normal limits. Musculoskeletal: Degenerative changes of the thoracic spine are seen. No acute bony abnormality is noted. Review of the MIP images confirms the above findings. IMPRESSION: No specific aortic abnormality to correspond with the given clinical history of embolic change is seen. No evidence of pulmonary emboli. Chronic bronchial thickening and mucous plugging particularly in the lower lobes with some reactive lymphadenopathy stable from the prior exam. Resolution of right lower lobe infiltrate. Aortic Atherosclerosis (ICD10-I70.0). Electronically Signed   By: Alcide Clever M.D.   On: 10/09/2018 16:31   Vas [place] Upper Extremity Arterial Duplex  Result Date: 10/10/2018 UPPER EXTREMITY DUPLEX STUDY Indications: Gangrene of bilateral  fingers and upper extremities' coldness.  Limitations: Body habitus Comparison Study: No priors available Performing Technologist: Melodie Bouillon  Examination Guidelines: A complete evaluation includes B-mode imaging, spectral Doppler, color Doppler, and power Doppler as needed of all accessible portions of each vessel. Bilateral testing is considered an integral part of a complete examination. Limited examinations for reoccurring indications may be performed as noted.  Right Doppler Findings: +----------+----------+---------+------+--------+ Site      PSV (cm/s)Waveform PlaqueComments +----------+----------+---------+------+--------+ [id number]       triphasic               +----------+----------+---------+------+--------+ Brachial  107       triphasic               +----------+----------+---------+------+--------+ Radial    90        triphasic               +----------+----------+---------+------+--------+ Ulnar     112       triphasic               +----------+----------+---------+------+--------+ No obvious plaque or occlusion seen Left Doppler Findings: +--------+----------+---------+------+--------+ Site    PSV (cm/s)Waveform PlaqueComments +--------+----------+---------+------+--------+ [id number]       triphasic               +--------+----------+---------+------+--------+  Radial  110       triphasic               +--------+----------+---------+------+--------+ Ulnar   62        triphasic               +--------+----------+---------+------+--------+ No obvious plaque or occlusion seen  Summary:  Right: No obstruction visualized in the right upper extremity. Left: No obstruction visualized in the left upper extremity. *See table(s) above for measurements and observations. Electronically signed by Coral ElseVance Brabham MD on 10/10/2018 at 7:52:23 AM.    Final         Scheduled Meds: . ambrisentan  5 mg Oral Daily  . [START ON 10/11/2018] amLODipine  10 mg  Oral Daily  . amLODipine  5 mg Oral Once  . atorvastatin  20 mg Oral q1800  . docusate sodium  100 mg Oral QHS  . DULoxetine  90 mg Oral Daily  . guaiFENesin  600 mg Oral QHS  . hydroxychloroquine  200 mg Oral 2 times per day on Mon Tue Wed Thu Fri  . insulin aspart  0-5 Units Subcutaneous QHS  . insulin aspart  0-9 Units Subcutaneous TID WC  . ipratropium-albuterol  3 mL Nebulization BID  . lamoTRIgine  25 mg Oral BID  . montelukast  10 mg Oral QHS  . nitroGLYCERIN  0.25 inch Topical Q8H  . pantoprazole  40 mg Oral Daily  . sodium chloride HYPERTONIC  4 mL Nebulization BID  . umeclidinium-vilanterol  1 puff Inhalation Daily   Continuous Infusions: . heparin 800 Units/hr (10/10/18 0904)     LOS: 1 day   Time spent= 35 mins    Rhiana Morash Joline Maxcyhirag Azzan Butler, MD [facility] Pager [phone]   If 7PM-7AM, please contact night-coverage [email or web address] Password [email or web address] 10/10/2018, 1:53 PM

## 2018-10-11 ENCOUNTER — Ambulatory Visit: Payer: Self-pay | Admitting: Physician Assistant

## 2018-10-11 ENCOUNTER — Telehealth: Payer: Self-pay | Admitting: *Deleted

## 2018-10-11 ENCOUNTER — Encounter (HOSPITAL_COMMUNITY)
Admission: EM | Disposition: A | Discharge status: Home / Self Care | Payer: Self-pay | Source: Home / Self Care | Attending: Internal Medicine

## 2018-10-11 ENCOUNTER — Ambulatory Visit: Payer: Medicare HMO | Admitting: Psychiatry

## 2018-10-11 ENCOUNTER — Ambulatory Visit: Payer: Self-pay | Admitting: Rheumatology

## 2018-10-11 HISTORY — PX: UPPER EXTREMITY ANGIOGRAPHY: CATH118270

## 2018-10-11 LAB — CBC
HCT: 26 % — ABNORMAL LOW (ref 36.0–46.0)
Hemoglobin: 8.7 g/dL — ABNORMAL LOW (ref 12.0–15.0)
MCH: 30 pg (ref 26.0–34.0)
MCHC: 33.5 g/dL (ref 30.0–36.0)
MCV: 89.7 fL (ref 80.0–100.0)
Platelets: 380 10*3/uL (ref 150–400)
RBC: 2.9 MIL/uL — ABNORMAL LOW (ref 3.87–5.11)
RDW: 18.6 % — ABNORMAL HIGH (ref 11.5–15.5)
WBC: 24.6 10*3/uL — ABNORMAL HIGH (ref 4.0–10.5)
nRBC: 0 % (ref 0.0–0.2)

## 2018-10-11 LAB — HEPARIN LEVEL (UNFRACTIONATED): Heparin Unfractionated: 0.36 IU/mL (ref 0.30–0.70)

## 2018-10-11 LAB — GLUCOSE, CAPILLARY
Glucose-Capillary: 80 mg/dL (ref 70–99)
Glucose-Capillary: 65 mg/dL — ABNORMAL LOW (ref 70–99)
Glucose-Capillary: 73 mg/dL (ref 70–99)
Glucose-Capillary: 67 mg/dL — ABNORMAL LOW (ref 70–99)
Glucose-Capillary: 82 mg/dL (ref 70–99)

## 2018-10-11 LAB — C3 COMPLEMENT: C3 Complement: 103 mg/dL (ref 82–167)

## 2018-10-11 LAB — ANA W/REFLEX IF POSITIVE: Anti Nuclear Antibody(ANA): NEGATIVE

## 2018-10-11 LAB — C4 COMPLEMENT: Complement C4, Body Fluid: 25 mg/dL (ref 14–44)

## 2018-10-11 LAB — POCT ACTIVATED CLOTTING TIME: Activated Clotting Time: 153 seconds

## 2018-10-11 SURGERY — UPPER EXTREMITY ANGIOGRAPHY
Anesthesia: LOCAL | Laterality: Bilateral

## 2018-10-11 MED ORDER — DEXTROSE 50 % IV SOLN
25.0000 mL | INTRAVENOUS | Status: DC | PRN
  Administered 2018-10-13 – 2018-10-14 (×4): 25 mL via INTRAVENOUS
  Filled 2018-10-11 (×2): qty 50

## 2018-10-11 MED ORDER — SODIUM CHLORIDE 0.9 % IV SOLN: 250.0000 mL | INTRAVENOUS | Status: DC | PRN

## 2018-10-11 MED ORDER — ACETAMINOPHEN 325 MG PO TABS
650.0000 mg | ORAL_TABLET | ORAL | Status: DC | PRN
  Administered 2018-10-15: 650 mg via ORAL
  Filled 2018-10-11: qty 2

## 2018-10-11 MED ORDER — DEXTROSE 50 % IV SOLN
INTRAVENOUS | Status: AC
  Filled 2018-10-11: qty 50

## 2018-10-11 MED ORDER — HEPARIN (PORCINE) 25000 UT/250ML-% IV SOLN
800.0000 [IU]/h | INTRAVENOUS | Status: DC
  Administered 2018-10-12: 06:00:00 800 [IU]/h via INTRAVENOUS
  Filled 2018-10-11 (×2): qty 250

## 2018-10-11 MED ORDER — HEPARIN (PORCINE) IN NACL 1000-0.9 UT/500ML-% IV SOLN
INTRAVENOUS | Status: AC
  Filled 2018-10-11: qty 1000

## 2018-10-11 MED ORDER — MORPHINE SULFATE (PF) 10 MG/ML IV SOLN: 2.0000 mg | INTRAVENOUS | Status: DC | PRN

## 2018-10-11 MED ORDER — FENTANYL CITRATE (PF) 100 MCG/2ML IJ SOLN
INTRAMUSCULAR | Status: AC
  Filled 2018-10-11: qty 2

## 2018-10-11 MED ORDER — MORPHINE SULFATE (PF) 2 MG/ML IV SOLN
2.0000 mg | INTRAVENOUS | Status: DC | PRN
  Administered 2018-10-11 – 2018-10-14 (×9): 2 mg via INTRAVENOUS
  Filled 2018-10-11 (×9): qty 1

## 2018-10-11 MED ORDER — FENTANYL CITRATE (PF) 100 MCG/2ML IJ SOLN
INTRAMUSCULAR | Status: DC | PRN
  Administered 2018-10-11: 25 ug via INTRAVENOUS

## 2018-10-11 MED ORDER — SODIUM CHLORIDE 0.9 % IV SOLN
INTRAVENOUS | Status: AC
  Administered 2018-10-11: 19:00:00 via INTRAVENOUS

## 2018-10-11 MED ORDER — ANGIOPLASTY BOOK
Freq: Once | Status: DC
  Filled 2018-10-11: qty 1

## 2018-10-11 MED ORDER — LIDOCAINE HCL (PF) 1 % IJ SOLN
INTRAMUSCULAR | Status: AC
  Filled 2018-10-11: qty 30

## 2018-10-11 MED ORDER — HEPARIN (PORCINE) IN NACL 1000-0.9 UT/500ML-% IV SOLN
INTRAVENOUS | Status: DC | PRN
  Administered 2018-10-11 (×2): 500 mL

## 2018-10-11 MED ORDER — HYDRALAZINE HCL 20 MG/ML IJ SOLN: 5.0000 mg | INTRAMUSCULAR | Status: DC | PRN

## 2018-10-11 MED ORDER — LIDOCAINE HCL (PF) 1 % IJ SOLN
INTRAMUSCULAR | Status: DC | PRN
  Administered 2018-10-11: 20 mL via INTRADERMAL

## 2018-10-11 MED ORDER — DEXTROSE 50 % IV SOLN
25.0000 g | Freq: Once | INTRAVENOUS | Status: AC
  Administered 2018-10-11: 25 g via INTRAVENOUS

## 2018-10-11 MED ORDER — SODIUM CHLORIDE 0.9% FLUSH
3.0000 mL | Freq: Two times a day (BID) | INTRAVENOUS | Status: DC
  Administered 2018-10-11 – 2018-10-16 (×5): 3 mL via INTRAVENOUS

## 2018-10-11 MED ORDER — SODIUM CHLORIDE 0.9% FLUSH: 3.0000 mL | INTRAVENOUS | Status: DC | PRN

## 2018-10-11 MED ORDER — OXYCODONE HCL 5 MG PO TABS
5.0000 mg | ORAL_TABLET | ORAL | Status: DC | PRN
  Administered 2018-10-11: 21:00:00 5 mg via ORAL
  Administered 2018-10-12: 10 mg via ORAL
  Administered 2018-10-12: 5 mg via ORAL
  Administered 2018-10-12 – 2018-10-14 (×5): 10 mg via ORAL
  Administered 2018-10-15: 5 mg via ORAL
  Administered 2018-10-15: 10 mg via ORAL
  Administered 2018-10-15: 5 mg via ORAL
  Filled 2018-10-11 (×2): qty 1
  Filled 2018-10-11 (×7): qty 2
  Filled 2018-10-11: qty 1
  Filled 2018-10-11: qty 2

## 2018-10-11 MED ORDER — LABETALOL HCL 5 MG/ML IV SOLN
10.0000 mg | INTRAVENOUS | Status: DC | PRN
  Filled 2018-10-11: qty 4

## 2018-10-11 MED ORDER — IODIXANOL 320 MG/ML IV SOLN
INTRAVENOUS | Status: DC | PRN
  Administered 2018-10-11: 170 mL via INTRA_ARTERIAL

## 2018-10-11 MED ORDER — ONDANSETRON HCL 4 MG/2ML IJ SOLN: 4.0000 mg | Freq: Four times a day (QID) | INTRAMUSCULAR | Status: DC | PRN

## 2018-10-11 SURGICAL SUPPLY — 10 items
CATH ANGIO 5F BER2 100CM (CATHETERS) ×1 IMPLANT
CATH ANGIO 5F PIGTAIL 100CM (CATHETERS) ×1 IMPLANT
KIT PV (KITS) ×2 IMPLANT
SHEATH PINNACLE 5F 10CM (SHEATH) ×1 IMPLANT
SHEATH PROBE COVER 6X72 (BAG) ×1 IMPLANT
SYR MEDRAD MARK V 150ML (SYRINGE) ×1 IMPLANT
TRANSDUCER W/STOPCOCK (MISCELLANEOUS) ×2 IMPLANT
TRAY PV CATH (CUSTOM PROCEDURE TRAY) ×1 IMPLANT
WIRE HI TORQ VERSACORE J 260CM (WIRE) ×1 IMPLANT
WIRE J 3MM .035X145CM (WIRE) ×1 IMPLANT

## 2018-10-11 NOTE — Progress Notes (Signed)
PROGRESS NOTE  Desiree [name] OVZ:[id number] DOB: 04/03/1951 DOA: 10/09/2018 PCP: Madelin Headings, MD   LOS: 2 days   Brief Narrative / Interim history: 67 year old with history of Raynaud's disease with gangrene bronchiectasis, polyarthritis with positive rheumatoid factor, GERD, hypertrophic cardiomyopathy, bipolar disorder, CKD stage III, COPD, depression, diabetes mellitus type 2 came to the hospital with worsening of the pain in her bilateral fingers concerning for Raynaud's phenomenon.  Case was discussed by the admitting provider with rheumatology at [facility] who recommended heparin, Norvasc and digital Nitropaste.    Vascular surgery initially consulted in the ED and recommended [facility] transfer, however patient ended up staying here.  Dr. Nelson Chimes discussed with vascular surgery and tentatively she will undergo upper extremity angiogram today  Subjective: Continues to complain of finger pain, excruciating at times.  Assessment & Plan: Active Problems:   Eosinophilia   Essential hypertension   GERD   Renal insufficiency   Diabetes mellitus with renal manifestations, controlled (HCC)   Bipolar affective disorder, currently depressed, moderate (HCC)   Hyperlipidemia   Bronchiectasis without complication (HCC)   Leucocytosis   Gangrene of finger (HCC)   Raynaud's phenomenon with gangrene (HCC)   LVH (left ventricular hypertrophy)   Principal Problem Bilateral digital pain secondary to Raynaud's phenomenon with gangrene -Follows with outpatient Dr. Corliss Skains from Rheumatology.  Continue hydroxychloroquine -Currently she is on Norvasc 10 mg, heparin drip, nitroglycerin paste on fingertips, continue symptomatic management with pain control -Dr. Darrick Penna tentatively plans angiogram today  Additional Problems Essential hypertension -Currently on Norvasc, her ARB has been placed on hold due to soft blood pressures  Eosinophilia, chronic -Outpatient  follow-up  GERD -PPI  Insulin-dependent diabetes mellitus with renal manifestation -Insulin sliding scale and Accu-Cheks  Bipolar disorder -Cymbalta 90 mg daily  Hyperlipidemia -Atorvastatin 20 mg daily  GERD -Continue Protonix  Hyperlipidemia -Continue statin   Scheduled Meds: . amLODipine  10 mg Oral Daily  . aspirin EC  81 mg Oral Daily  . atorvastatin  20 mg Oral q1800  . docusate sodium  100 mg Oral QHS  . DULoxetine  90 mg Oral Daily  . guaiFENesin  600 mg Oral QHS  . hydroxychloroquine  200 mg Oral 2 times per day on Mon Tue Wed Thu Fri  . insulin aspart  0-5 Units Subcutaneous QHS  . insulin aspart  0-9 Units Subcutaneous TID WC  . ipratropium-albuterol  3 mL Nebulization BID  . lamoTRIgine  25 mg Oral BID  . montelukast  10 mg Oral QHS  . nitroGLYCERIN  0.25 inch Topical Q8H  . pantoprazole  40 mg Oral Daily  . sodium chloride HYPERTONIC  4 mL Nebulization BID  . umeclidinium-vilanterol  1 puff Inhalation Daily   Continuous Infusions: . heparin 800 Units/hr (10/10/18 2053)   PRN Meds:.acetaminophen **OR** acetaminophen, linaclotide, morphine injection, ondansetron **OR** ondansetron (ZOFRAN) IV  DVT prophylaxis: Heparin drip Code Status: Full code Family Communication: No family at bedside Disposition Plan: To be determined  Consultants:   Vascular surgery  Procedures:   None   Antimicrobials:  None    Objective: Vitals:   10/10/18 0819 10/10/18 1505 10/10/18 1936 10/11/18 0500  BP:  (!) 118/56 (!) 115/56 (!) 102/57  Pulse:  90 99 96  Resp:  20 19   Temp:  98.7 F (37.1 C) 98.7 F (37.1 C) 99.2 F (37.3 C)  TempSrc:  Oral Oral Oral  SpO2: 95% 97% 98% 98%  Weight:      Height:  Intake/Output Summary (Last 24 hours) at 10/11/2018 1115 Last data filed at 10/10/2018 2053 Gross per 24 hour  Intake 840 ml  Output -  Net 840 ml   Filed Weights   10/09/18 1309 10/09/18 2213  Weight: 58.5 kg 59.2 kg     Examination:  Constitutional: NAD Eyes: PERRL, lids and conjunctivae normal ENMT: Mucous membranes are moist. Neck: normal, supple Respiratory: clear to auscultation bilaterally, no wheezing, no crackles.   Cardiovascular: Regular rate and rhythm, no murmurs / rubs / gallops. No LE edema.  Abdomen: no tenderness. Musculoskeletal: Rainouts phenomenon noted bilateral hands/fingers, several ulcerative areas on tip of the fingers Neurologic: CN 2-12 grossly intact. Strength 5/5 in all 4.  Psychiatric: Normal judgment and insight. Alert and oriented x 3. Normal mood.    Data Reviewed: I have independently reviewed following labs and imaging studies   CBC: Recent Labs  Lab 10/08/18 1535 10/09/18 1330 10/10/18 0125 10/11/18 0253  WBC 19.7* 21.2* 25.4* 24.6*  NEUTROABS 5,299  --   --   --   HGB 10.2* 10.5* 9.8* 8.7*  HCT 31.7* 33.3* 30.0* 26.0*  MCV 88.3 92.0 88.8 89.7  PLT 443* 428* 387 380   Basic Metabolic Panel: Recent Labs  Lab 10/09/18 1330 10/10/18 0125  NA 138 138  K 3.6 3.6  CL 108 108  CO2 20* 24  GLUCOSE 105* 107*  BUN 16 14  CREATININE 1.40* 1.26*  CALCIUM 8.4* 7.9*  MG  --  1.8  PHOS  --  3.4   GFR: Estimated Creatinine Clearance: 36.3 mL/min (A) (by C-G formula based on SCr of 1.26 mg/dL (H)). Liver Function Tests: Recent Labs  Lab 10/08/18 1535 10/09/18 1330 10/10/18 0125  AST  --  24 21  ALT  --  15 12  ALKPHOS  --  92 89  BILITOT  --  0.7 0.4  PROT 7.2 7.1 6.1*  ALBUMIN  --  2.2* 1.9*   No results for input(s): LIPASE, AMYLASE in the last 168 hours. No results for input(s): AMMONIA in the last 168 hours. Coagulation Profile: Recent Labs  Lab 10/09/18 1330  INR 1.16   Cardiac Enzymes: No results for input(s): CKTOTAL, CKMB, CKMBINDEX, TROPONINI in the last 168 hours. BNP (last 3 results) No results for input(s): PROBNP in the last 8760 hours. HbA1C: Recent Labs    10/10/18 0125  HGBA1C 4.9   CBG: Recent Labs  Lab  10/10/18 0614 10/10/18 1102 10/10/18 1606 10/10/18 2118 10/11/18 0650  GLUCAP 86 75 87 105* 80   Lipid Profile: No results for input(s): CHOL, HDL, LDLCALC, TRIG, CHOLHDL, LDLDIRECT in the last 72 hours. Thyroid Function Tests: Recent Labs    10/10/18 0125  TSH 2.153   Anemia Panel: No results for input(s): VITAMINB12, FOLATE, FERRITIN, TIBC, IRON, RETICCTPCT in the last 72 hours. Urine analysis:    Component Value Date/Time   COLORURINE YELLOW 08/06/2018 1932   APPEARANCEUR CLEAR 08/06/2018 1932   LABSPEC >1.046 (H) 08/06/2018 1932   PHURINE 6.0 08/06/2018 1932   GLUCOSEU NEGATIVE 08/06/2018 1932   HGBUR NEGATIVE 08/06/2018 1932   HGBUR negative 03/11/2008 1317   BILIRUBINUR NEGATIVE 08/06/2018 1932   BILIRUBINUR n 08/07/2017 1558   KETONESUR 5 (A) 08/06/2018 1932   PROTEINUR 100 (A) 08/06/2018 1932   UROBILINOGEN 1.0 08/07/2017 1558   UROBILINOGEN 0.2 08/16/2014 1513   NITRITE NEGATIVE 08/06/2018 1932   LEUKOCYTESUR NEGATIVE 08/06/2018 1932   Sepsis Labs: Invalid input(s): PROCALCITONIN, LACTICIDVEN  No results found for this  or any previous visit (from the past 240 hour(s)).    Radiology Studies: Ct Angio Chest Aorta W And/or Wo Contrast  Result Date: 10/09/2018 CLINICAL DATA:  Discoloration with history of Raynaud's disease EXAM: CT ANGIOGRAPHY CHEST WITH CONTRAST TECHNIQUE: Multidetector CT imaging of the chest was performed using the standard protocol during bolus administration of intravenous contrast. Multiplanar CT image reconstructions and MIPs were obtained to evaluate the vascular anatomy. CONTRAST:  ISOVUE-370 IOPAMIDOL (ISOVUE-370) INJECTION 76% COMPARISON:  08/06/2018 chest x-ray, CT from 07/11/2018 FINDINGS: Cardiovascular: Thoracic aorta demonstrates a normal branching pattern. Mild atherosclerotic calcifications are seen. No aneurysmal dilatation is seen. No ulcerated plaque is identified to suggest embolic source. The visualized brachiocephalic  vessels are within normal limits. Pulmonary artery is well visualized and demonstrates no filling defect to suggest pulmonary embolism. Mediastinum/Nodes: Thoracic inlet is within normal limits. No sizable mediastinal adenopathy is noted. Symmetrical hilar lymph nodes are seen likely reactive in nature. In retrospect these are stable from a prior CT examination. The esophagus is within normal limits. Lungs/Pleura: Bronchial thickening and areas of mucous plugging are identified particularly in the lower lobes. These changes are stable from the prior exam. Previously seen infiltrate in the right lower lobe has resolved in the interval. No sizable effusion or pneumothorax is seen. No sizable nodule is noted. Mild dependent atelectatic changes are noted. Upper Abdomen: Visualized upper abdomen is within normal limits. Musculoskeletal: Degenerative changes of the thoracic spine are seen. No acute bony abnormality is noted. Review of the MIP images confirms the above findings. IMPRESSION: No specific aortic abnormality to correspond with the given clinical history of embolic change is seen. No evidence of pulmonary emboli. Chronic bronchial thickening and mucous plugging particularly in the lower lobes with some reactive lymphadenopathy stable from the prior exam. Resolution of right lower lobe infiltrate. Aortic Atherosclerosis (ICD10-I70.0). Electronically Signed   By: Alcide Clever M.D.   On: 10/09/2018 16:31   Vas [place] Upper Extremity Arterial Duplex  Result Date: 10/10/2018 UPPER EXTREMITY DUPLEX STUDY Indications: Gangrene of bilateral fingers and upper extremities' coldness.  Limitations: Body habitus Comparison Study: No priors available Performing Technologist: Melodie Bouillon  Examination Guidelines: A complete evaluation includes B-mode imaging, spectral Doppler, color Doppler, and power Doppler as needed of all accessible portions of each vessel. Bilateral testing is considered an integral part of a complete  examination. Limited examinations for reoccurring indications may be performed as noted.  Right Doppler Findings: +----------+----------+---------+------+--------+ Site      PSV (cm/s)Waveform PlaqueComments +----------+----------+---------+------+--------+ [id number]       triphasic               +----------+----------+---------+------+--------+ Brachial  107       triphasic               +----------+----------+---------+------+--------+ Radial    90        triphasic               +----------+----------+---------+------+--------+ Ulnar     112       triphasic               +----------+----------+---------+------+--------+ No obvious plaque or occlusion seen Left Doppler Findings: +--------+----------+---------+------+--------+ Site    PSV (cm/s)Waveform PlaqueComments +--------+----------+---------+------+--------+ [id number]       triphasic               +--------+----------+---------+------+--------+ Radial  110       triphasic               +--------+----------+---------+------+--------+  Ulnar   62        triphasic               +--------+----------+---------+------+--------+ No obvious plaque or occlusion seen  Summary:  Right: No obstruction visualized in the right upper extremity. Left: No obstruction visualized in the left upper extremity. *See table(s) above for measurements and observations. Electronically signed by Coral Else MD on 10/10/2018 at 7:52:23 AM.    Final     Pamella Pert, MD, PhD [facility] Pager [phone]  If 7PM-7AM, please contact night-coverage [email or web address] Password [email or web address] 10/11/2018, 11:15 AM

## 2018-10-11 NOTE — Op Note (Signed)
Procedure: Arch aortogram bilateral upper extremity arteriogram  Preoperative diagnosis: Gangrene finger bilaterally  Postoperative diagnosis: Same  Anesthesia: Local  Operative findings:  1.  Occlusion of radial artery at wrist right side occlusion of ulnar artery at wrist left side  2.  Radial artery fills palmar arch on left side, ulnar artery fills palmar arch on right side  3.  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  The patient was placed in supine position Angio table.  Both groins were prepped and draped in usual sterile fashion.  Ultrasound was used to identify the right common femoral artery and femoral bifurcation.  This was patent.  A permanent hardcopy image was obtained placed in the patient's medical record.  At this point an introducer needle was used to cannulate the right common femoral artery after infiltration with lidocaine.  An 035 versacore wire was threaded up in the ascending aorta under fluoroscopic guidance.  A 5 French sheath placed over the guidewire in the right common femoral artery.  This was thoroughly flushed with saline.  A 5 French pigtail catheter was then advanced over the guidewire up in the ascending aorta.  Arch aortogram was then obtained in a 40 degree LAO projection.  This shows overlap of the right subclavian and right common left common carotid arteries.  The left subclavian artery is patent.  The vertebral arteries are large and patent bilaterally.  The common carotids and subclavian arteries are patent bilaterally.  In order to better opacify the origins of the great vessels and cranial oblique view was obtained which shows normal arch configuration with a type II arch  At this point the pigtail catheter was removed over guidewire.  A 5 French vertebral catheter was then used to selectively catheterize the innominate followed by the right  subclavian artery.  The guidewire was initially tracking up into the vertebral artery so the versacore wire was exchanged out for a 035 J-wire which I was then able to advance out in the right subclavian and then advanced the catheter over the guidewire out into the right axillary artery.  Right upper extremity arteriogram was then obtained.  The right subclavian artery is widely patent.  The right axillary artery is widely patent.  The right brachial artery is widely patent.  The proximal radial artery is patent but with a beaded chain appearance becomes more diseased and obliterates and occludes at the level of the wrist.  The interosseous artery is patent but small.  The ulnar artery is patent and fills the palmar arch.  There is total obliteration of the digital vessels of digits 1-4 and 5.  There is filling the digital vessels of the third digit.  At this point the catheter was pulled back over guidewire and then in similar fashion the left subclavian artery was selectively catheterized and the J-wire advanced out into the distal left axillary artery.  The catheter was then advanced over this the left upper extremity arteriogram was obtained.  In the left upper extremity the left axillary and brachial arteries are patent.  The left radial artery is patent and fills the superficial palmar arch.  Interosseous artery is again very small.  The ulnar artery obliterates at the level of the wrist.  There is minimal filling on the left side also of the digital vessels.  There is blood duration of the digital vessels of digits 1-4 and 5.  There  is filling of the digital vessels of digit 3 on the left hand.  At this point the catheter was removed over guidewire.  The 5 French sheath was left in place to be pulled in the holding area.  The patient tolerated the procedure well and there were no complications.  Patient was taken to the holding area in stable condition.  Operative management: Patient does not have  reconstructive a large vessel occlusive disease.  She has obliteration of digital vessels most likely from her vasospastic poor underlying rheumatologic disorder.  No further vascular surgical intervention at this point.  Fabienne Bruns, MD [facility] Office: [phone] Pager: [phone]

## 2018-10-11 NOTE — Care Management Important Message (Signed)
Important Message  Patient Details  Name: Desiree [name] MRN: [id number] Date of Birth: Jan 15, 1951   Medicare Important Message Given:  Yes    Dorena Bodo 10/11/2018, 2:42 PM

## 2018-10-11 NOTE — Consult Note (Signed)
   [facility] CM Inpatient Consult   10/11/2018  Desiree [name] [birth year] [id number]   Patient is currently active with [facility] for chronic disease management services.  Patient has been engaged by a [facility] of patient's hospitalization]  and HX with a [facility] Pharmacist and [facility] LCSW..  Our community based plan of care has focused on disease management and community resource support.  Patient will receive a post hospital call and will be evaluated for monthly home visits for assessments and disease process education.  Chart review reveals per MD notes:  The patient is a  67 year old with history of Raynaud's disease with gangrene bronchiectasis, polyarthritis with positive rheumatoid factor, GERD, hypertrophic cardiomyopathy, bipolar disorder, CKD stage III, COPD, depression, diabetes mellitus type 2 came to the hospital with worsening of the pain in her bilateral fingers concerning for Raynaud's phenomenon.  Met with the patient at the bedside.  Patient states she is having difficulty with her hands and for post hospital follow up she wants to see if there is any assistance for her ADLs, as she lives alone.  Will follow up with [facility] team for resource inquiry.  Will follow for progress and disposition.  For questions, please contact:  Natividad Brood, RN BSN [facility] Liaison  [phone] business mobile phone Toll free office [phone]

## 2018-10-11 NOTE — Progress Notes (Signed)
ANTICOAGULATION CONSULT NOTE  Pharmacy Consult:  Heparin Indication: Raynaud's  Allergies  Allergen Reactions  . Prednisone Shortness Of Breath, Itching, Nausea And Vomiting and Palpitations  . Solu-Medrol [Methylprednisolone] Anaphylaxis  . Amoxicillin Hives and Rash    Has patient had a PCN reaction causing immediate rash, facial/tongue/throat swelling, SOB or lightheadedness with hypotension:Yes Has patient had a PCN reaction causing severe rash involving mucus membranes or skin necrosis: No Has patient had a PCN reaction that required hospitalization: No Has patient had a PCN reaction occurring within the last 10 years: No If all of the above answers are "NO", then may proceed with Cephalosporin use.   . Codeine Hives, Itching and Rash  . Hydrocodone-Acetaminophen Hives, Itching and Rash  . Penicillins Hives, Itching and Rash    ALLERGIC REACTION TO ORAL AMOXICILLIN Has patient had a PCN reaction causing immediate rash, facial/tongue/throat swelling, SOB or lightheadedness with hypotension: Yes Has patient had a PCN reaction causing severe rash involving mucus membranes or skin necrosis: No Has patient had a PCN reaction that required hospitalization: No Has patient had a PCN reaction occurring within the last 10 years: No If all of the above answers are "NO", then may proceed with Cephalosporin use.    Patient Measurements: Height: 5\' 3"  (160 cm) Weight: 130 lb 8.2 oz (59.2 kg) IBW/kg (Calculated) : 52.4 Heparin Dosing Weight: 59 kg  Vital Signs: Temp: 99.2 F (37.3 C) (12/20 0500) Temp Source: Oral (12/20 0500) BP: 102/57 (12/20 0500) Pulse Rate: 96 (12/20 0500)  Labs: Recent Labs    10/09/18 1330  10/10/18 0125 10/10/18 0740 10/10/18 1613 10/11/18 0253  HGB 10.5*  --  9.8*  --   --  8.7*  HCT 33.3*  --  30.0*  --   --  26.0*  PLT 428*  --  387  --   --  380  APTT 36  --   --   --   --   --   LABPROT 14.7  --   --   --   --   --   INR 1.16  --   --   --   --    --   HEPARINUNFRC  --    < > 0.37 0.24* 0.36 0.36  CREATININE 1.40*  --  1.26*  --   --   --    < > = values in this interval not displayed.    Estimated Creatinine Clearance: 36.3 mL/min (A) (by C-G formula based on SCr of 1.26 mg/dL (H)).   Assessment: Desiree [name] presented to the ED with hand pain.  Pharmacy consulted to dose IV heparin per [facility] MD recommendations for Raynaud's.   Heparin level remains therapeutic. Hg down a bit to 8.7, plt wnl. No bleed issues documented.  Goal of Therapy:  Heparin level 0.3-0.7 units/ml Monitor platelets by anticoagulation protocol: Yes   Plan:  Continue heparin at 800 units / hr Monitor daily heparin level and CBC, s/sx bleeding F/U LOT  [facility], PharmD, BCPS Clinical Pharmacist Clinical phone [phone] Please check AMION for all [facility] contact numbers 10/11/2018 9:04 AM

## 2018-10-11 NOTE — Telephone Encounter (Signed)
-----   Message from Sherren Kerns, MD sent at 10/11/2018  4:53 PM EST ----- Procedure: Arch aortogram bilateral upper extremity arteriogram 3nd order right subclavian 2nd order left subclavian    Operative management: Patient does not have reconstructive a large vessel occlusive disease.  She has obliteration of digital vessels most likely from her vasospastic poor underlying rheumatologic disorder.  No further vascular surgical intervention at this point.  Fabienne Bruns, MD [facility] [place] Office: [phone] Pager: [phone]

## 2018-10-11 NOTE — Interval H&P Note (Signed)
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 After consideration of risks, benefits and other options for treatment, the patient has consented to  Procedure(s): UPPER EXTREMITY ANGIOGRAPHY (Bilateral) as a surgical intervention .  The patient's history has been reviewed, patient examined, no change in status, stable for surgery.  I have reviewed the patient's chart and labs.  Questions were answered to the patient's satisfaction.     Fabienne Bruns

## 2018-10-11 NOTE — Progress Notes (Signed)
CRITICAL VALUE ALERT  Critical Value:  CBG 65  Date & Time Notied: 12/20@1240   Provider Notified: Paged Dr Lafe Garin via Loretha Stapler.  Orders Received/Actions taken: Gave 1/2 AMP D50 per protocol. Awaiting callback from MD>

## 2018-10-11 NOTE — Progress Notes (Signed)
Site area: Right groin a 5 french arterial sheath was removed  Site Prior to Removal:  Level 0  Pressure Applied For 20 MINUTES    Bedrest Beginning at 1730p  Manual:   Yes.    Patient Status During Pull:  stable  Post Pull Groin Site:  Level 0  Post Pull Instructions Given:  Yes.    Post Pull Pulses Present:  Yes.    Dressing Applied:  Yes.    Comments:

## 2018-10-12 DIAGNOSIS — I96 Gangrene, not elsewhere classified: Secondary | ICD-10-CM

## 2018-10-12 LAB — COMPREHENSIVE METABOLIC PANEL
ALT: 13 U/L (ref 0–44)
AST: 20 U/L (ref 15–41)
Albumin: 1.8 g/dL — ABNORMAL LOW (ref 3.5–5.0)
Alkaline Phosphatase: 79 U/L (ref 38–126)
Anion gap: 7 (ref 5–15)
BUN: 10 mg/dL (ref 8–23)
CO2: 22 mmol/L (ref 22–32)
Calcium: 8.1 mg/dL — ABNORMAL LOW (ref 8.9–10.3)
Chloride: 108 mmol/L (ref 98–111)
Creatinine, Ser: 1.13 mg/dL — ABNORMAL HIGH (ref 0.44–1.00)
GFR calc Af Amer: 59 mL/min — ABNORMAL LOW (ref >60)
GFR calc non Af Amer: 51 mL/min — ABNORMAL LOW (ref >60)
Glucose, Bld: 96 mg/dL (ref 70–99)
Potassium: 3.7 mmol/L (ref 3.5–5.1)
Sodium: 137 mmol/L (ref 135–145)
Total Bilirubin: 0.5 mg/dL (ref 0.3–1.2)
Total Protein: 6.1 g/dL — ABNORMAL LOW (ref 6.5–8.1)

## 2018-10-12 LAB — CBC
HCT: 27.4 % — ABNORMAL LOW (ref 36.0–46.0)
Hemoglobin: 8.9 g/dL — ABNORMAL LOW (ref 12.0–15.0)
MCH: 29.4 pg (ref 26.0–34.0)
MCHC: 32.5 g/dL (ref 30.0–36.0)
MCV: 90.4 fL (ref 80.0–100.0)
Platelets: 383 10*3/uL (ref 150–400)
RBC: 3.03 MIL/uL — ABNORMAL LOW (ref 3.87–5.11)
RDW: 18.7 % — ABNORMAL HIGH (ref 11.5–15.5)
WBC: 26.7 10*3/uL — ABNORMAL HIGH (ref 4.0–10.5)
nRBC: 0 % (ref 0.0–0.2)

## 2018-10-12 LAB — GLUCOSE, CAPILLARY
Glucose-Capillary: 81 mg/dL (ref 70–99)
Glucose-Capillary: 81 mg/dL (ref 70–99)
Glucose-Capillary: 82 mg/dL (ref 70–99)
Glucose-Capillary: 84 mg/dL (ref 70–99)

## 2018-10-12 LAB — HEPARIN LEVEL (UNFRACTIONATED): Heparin Unfractionated: <0.1 IU/mL — ABNORMAL LOW (ref 0.30–0.70)

## 2018-10-12 MED ORDER — HEPARIN SODIUM (PORCINE) 5000 UNIT/ML IJ SOLN
5000.0000 [IU] | Freq: Three times a day (TID) | INTRAMUSCULAR | Status: DC
  Administered 2018-10-12 – 2018-10-13 (×2): 5000 [IU] via SUBCUTANEOUS
  Filled 2018-10-12 (×2): qty 1

## 2018-10-12 NOTE — Plan of Care (Signed)
  Problem: Education: Goal: Knowledge of General Education information will improve Description Including pain rating scale, medication(s)/side effects and non-pharmacologic comfort measures Outcome: Progressing   Problem: Health Behavior/Discharge Planning: Goal: Ability to manage health-related needs will improve Outcome: Progressing   

## 2018-10-12 NOTE — Progress Notes (Signed)
PROGRESS NOTE    [name]  XTA:[id number] DOB: 07-20-1951 DOA: 10/09/2018 PCP: [name], MD     Brief Narrative:  Desiree [name] is a 67 year old with history of Raynaud's disease with gangrene bronchiectasis, polyarthritis with positive rheumatoid factor, GERD, hypertrophic cardiomyopathy, bipolar disorder, CKD stage III, COPD, depression, diabetes mellitus type 2 who came to the hospital with worsening of the pain in her bilateral fingers concerning for Raynaud's phenomenon. Case was discussed by the admitting provider with rheumatology at [facility] who recommended heparin gtt, Norvasc and digital Nitropaste. She was evaluated by vascular surgery and underwent arteriogram 12/20.   New events last 24 hours / Subjective: Continues to have pain in bilateral fingers, worst left middle finger.   Assessment & Plan:   Active Problems:   Eosinophilia   Essential hypertension   GERD   Renal insufficiency   Diabetes mellitus with renal manifestations, controlled ([place])   Bipolar affective disorder, currently depressed, moderate (HCC)   Hyperlipidemia   Bronchiectasis without complication (HCC)   Leucocytosis   Gangrene of finger ([place])   Raynaud's phenomenon with gangrene (HCC)   LVH (left ventricular hypertrophy)   Bilateral digital pain secondary to Raynaud's phenomenon with gangrene -Follows with outpatient Dr. [name] from Rheumatology.Continue hydroxychloroquine -Currently she is on Norvasc 10 mg, nitroglycerin paste on fingertips -S/p arteriogram, discussed with Dr. Carlis Abbott, no reconstructive large vessel occlusive disease and no further vascular surgical intervention planned -Discussed over the phone with Dr. [name]. [name]   Chronic hypereosinophilia  -Follows with Dr. Lindi Adie, planning for possibly bone marrow biopsy in the future   Essential hypertension -Currently on Norvasc, her ARB has been  placed on hold due to soft blood pressures  Insulin-dependent diabetes mellitus with renal manifestation -SSI   Bipolar disorder -Continue cymbalta, lamictal   Hyperlipidemia -Continue lipitor   GERD -Continue Protonix   DVT prophylaxis: Subq hep Code Status: Full Family Communication: No family at bedside Disposition Plan: Pending work up   Consultants:   Vascular surgery  Procedures:   Arch aortogram bilateral upper extremity arteriogram 12/20  Antimicrobials:  Anti-infectives (From admission, onward)   Start     Dose/Rate Route Frequency Ordered Stop   10/09/18 2300  hydroxychloroquine (PLAQUENIL) tablet 200 mg    Note to Pharmacy:  Take 26m by mouth twice daily, Monday-Friday only. None on Saturday or Sunday.     20 0 mg Oral 2 times per day on Mon Tue Wed Thu Fri 10/09/18 2213          Objective: Vitals:   10/12/18 1017 10/12/18 1032 10/12/18 1047 10/12/18 1104  BP: 115/62 112/75 (!) 128/91 108/61  Pulse: (!) 102 (!) 103 (!) 103 98  Resp: 10 12 18    Temp:    99.1 F (37.3 C)  TempSrc:    Oral  SpO2: 95% 94% 93% 94%  Weight:      Height:        Intake/Output Summary (Last 24 hours) at 10/12/2018 1535 Last data filed at 10/12/2018 0801 Gross per 24 hour  Intake 636.36 ml  Output 700 ml  Net -63.64 ml   Filed Weights   10/09/18 1309 10/09/18 2213  Weight: 58.5 kg 59.2 kg    Examination:  General exam: Appears calm and comfortable  Respiratory system: Clear to auscultation. Respiratory effort normal. Cardiovascular system: S1 & S2 heard, RRR. No JVD, murmurs, rubs, gallops or clicks. No pedal edema. Gastrointestinal system: Abdomen is nondistended, soft  and nontender. No organomegaly or masses felt. Normal bowel sounds heard. Central nervous system: Alert and oriented. No focal neurological deficits. Extremities:     Psychiatry: Judgement and insight appear normal. Mood & affect appropriate.   Data Reviewed: I have personally  reviewed following labs and imaging studies  CBC: Recent Labs  Lab 10/08/18 1535 10/09/18 1330 10/10/18 0125 10/11/18 0253 10/12/18 0255  WBC 19.7* 21.2* 25.4* 24.6* 26.7*  NEUTROABS 5,299  --   --   --   --   HGB 10.2* 10.5* 9.8* 8.7* 8.9*  HCT 31.7* 33.3* 30.0* 26.0* 27.4*  MCV 88.3 92.0 88.8 89.7 90.4  PLT 443* 428* 387 380 578   Basic Metabolic Panel: Recent Labs  Lab 10/09/18 1330 10/10/18 0125 10/12/18 0255  NA 138 138 137  K 3.6 3.6 3.7  CL 108 108 108  CO2 20* 24 22  GLUCOSE 105* 107* 96  BUN 16 14 10   CREATININE 1.40* 1.26* 1.13*  CALCIUM 8.4* 7.9* 8.1*  MG  --  1.8  --   PHOS  --  3.4  --    GFR: Estimated Creatinine Clearance: 40.5 mL/min (A) (by C-G formula based on SCr of 1.13 mg/dL (H)). Liver Function Tests: Recent Labs  Lab 10/08/18 1535 10/09/18 1330 10/10/18 0125 10/12/18 0255  AST  --  24 21 20   ALT  --  15 12 13   ALKPHOS  --  92 89 79  BILITOT  --  0.7 0.4 0.5  PROT 7.2 7.1 6.1* 6.1*  ALBUMIN  --  2.2* 1.9* 1.8*   No results for input(s): LIPASE, AMYLASE in the last 168 hours. No results for input(s): AMMONIA in the last 168 hours. Coagulation Profile: Recent Labs  Lab 10/09/18 1330  INR 1.16   Cardiac Enzymes: No results for input(s): CKTOTAL, CKMB, CKMBINDEX, TROPONINI in the last 168 hours. BNP (last 3 results) No results for input(s): PROBNP in the last 8760 hours. HbA1C: Recent Labs    10/10/18 0125  HGBA1C 4.9   CBG: Recent Labs  Lab 10/11/18 1241 10/11/18 1905 10/11/18 1951 10/12/18 0645 10/12/18 1154  GLUCAP 73 67* 82 81 81   Lipid Profile: No results for input(s): CHOL, HDL, LDLCALC, TRIG, CHOLHDL, LDLDIRECT in the last 72 hours. Thyroid Function Tests: Recent Labs    10/10/18 0125  TSH 2.153   Anemia Panel: No results for input(s): VITAMINB12, FOLATE, FERRITIN, TIBC, IRON, RETICCTPCT in the last 72 hours. Sepsis Labs: No results for input(s): PROCALCITON, LATICACIDVEN in the last 168 hours.  No  results found for this or any previous visit (from the past 240 hour(s)).     Radiology Studies: No results found.    Scheduled Meds: . amLODipine  10 mg Oral Daily  . angioplasty book   Does not apply Once  . aspirin EC  81 mg Oral Daily  . atorvastatin  20 mg Oral q1800  . docusate sodium  100 mg Oral QHS  . DULoxetine  90 mg Oral Daily  . guaiFENesin  600 mg Oral QHS  . hydroxychloroquine  200 mg Oral 2 times per day on Mon Tue Wed Thu Fri  . insulin aspart  0-5 Units Subcutaneous QHS  . insulin aspart  0-9 Units Subcutaneous TID WC  . ipratropium-albuterol  3 mL Nebulization BID  . lamoTRIgine  25 mg Oral BID  . montelukast  10 mg Oral QHS  . nitroGLYCERIN  0.25 inch Topical Q8H  . pantoprazole  40 mg Oral Daily  .  sodium chloride flush  3 mL Intravenous Q12H  . sodium chloride HYPERTONIC  4 mL Nebulization BID  . umeclidinium-vilanterol  1 puff Inhalation Daily   Continuous Infusions: . sodium chloride       LOS: 3 days    Time spent: 50 minutes   Dessa Phi, DO [facility] [email or web address] Password [facility] 10/12/2018, 3:35 PM

## 2018-10-12 NOTE — Progress Notes (Signed)
[facility] [place]  Subjective  - POD#1 s/p bilateral upper extremity arteriogram. No acute events overnight.    Objective 102/62 (!) 102 98.8 F (37.1 C) (Oral) 13 93%  Intake/Output Summary (Last 24 hours) at 10/12/2018 0829 Last data filed at 10/11/2018 2330 Gross per 24 hour  Intake 396.36 ml  Output 700 ml  Net -303.64 ml    Right groin c/d/i - no hematoma Right foot warm and motor/sensory intact  Laboratory Lab Results: Recent Labs    10/11/18 0253 10/12/18 0255  WBC 24.6* 26.7*  HGB 8.7* 8.9*  HCT 26.0* 27.4*  PLT 380 383   BMET Recent Labs    10/10/18 0125 10/12/18 0255  NA 138 137  K 3.6 3.7  CL 108 108  CO2 24 22  GLUCOSE 107* 96  BUN 14 10  CREATININE 1.26* 1.13*  CALCIUM 7.9* 8.1*    COAG Lab Results  Component Value Date   INR 1.16 10/09/2018   INR 0.99 07/26/2016   INR 1.04 05/05/2011   No results found for: PTT  Assessment/Planning:  Per Dr. Darrick Penna, patient does not have reconstructive large vessel occlusive disease.  She has obliteration of digital vessels most likely from her vasospastic poor underlying rheumatologic disorder.  No further vascular surgical intervention at this point.  Vascular will sign off.   Cephus Shelling 10/12/2018 8:29 AM --  Cephus Shelling

## 2018-10-13 ENCOUNTER — Encounter (HOSPITAL_COMMUNITY): Payer: Self-pay | Admitting: *Deleted

## 2018-10-13 DIAGNOSIS — I7301 Raynaud's syndrome with gangrene: Principal | ICD-10-CM

## 2018-10-13 LAB — CBC WITH DIFFERENTIAL/PLATELET
Absolute Monocytes: 650 cells/uL (ref 200–950)
Basophils Absolute: 217 cells/uL — ABNORMAL HIGH (ref 0–200)
Basophils Relative: 1.1 %
Eosinophils Absolute: 9988 cells/uL — ABNORMAL HIGH (ref 15–500)
Eosinophils Relative: 50.7 %
HCT: 31.7 % — ABNORMAL LOW (ref 35.0–45.0)
Hemoglobin: 10.2 g/dL — ABNORMAL LOW (ref 11.7–15.5)
Lymphs Abs: 3546 cells/uL (ref 850–3900)
MCH: 28.4 pg (ref 27.0–33.0)
MCHC: 32.2 g/dL (ref 32.0–36.0)
MCV: 88.3 fL (ref 80.0–100.0)
MPV: 10 fL (ref 7.5–12.5)
Monocytes Relative: 3.3 %
Neutro Abs: 5299 cells/uL (ref 1500–7800)
Neutrophils Relative %: 26.9 %
Platelets: 443 10*3/uL — ABNORMAL HIGH (ref 140–400)
RBC: 3.59 10*6/uL — ABNORMAL LOW (ref 3.80–5.10)
RDW: 15.5 % — ABNORMAL HIGH (ref 11.0–15.0)
Total Lymphocyte: 18 %
WBC: 19.7 10*3/uL — ABNORMAL HIGH (ref 3.8–10.8)

## 2018-10-13 LAB — PROTEIN ELECTROPHORESIS, SERUM, WITH REFLEX
Albumin ELP: 2.5 g/dL — ABNORMAL LOW (ref 3.8–4.8)
Alpha 1: 0.6 g/dL — ABNORMAL HIGH (ref 0.2–0.3)
Alpha 2: 0.6 g/dL (ref 0.5–0.9)
Beta 2: 0.5 g/dL (ref 0.2–0.5)
Beta Globulin: 0.3 g/dL — ABNORMAL LOW (ref 0.4–0.6)
Gamma Globulin: 2.7 g/dL — ABNORMAL HIGH (ref 0.8–1.7)
Total Protein: 7.2 g/dL (ref 6.1–8.1)

## 2018-10-13 LAB — QUANTIFERON-TB GOLD PLUS
Mitogen-NIL: 2.24 IU/mL
NIL: 0.04 IU/mL
QuantiFERON-TB Gold Plus: NEGATIVE
TB1-NIL: 0 IU/mL
TB2-NIL: <0 IU/mL

## 2018-10-13 LAB — BASIC METABOLIC PANEL
Anion gap: 5 (ref 5–15)
BUN: 13 mg/dL (ref 8–23)
CO2: 24 mmol/L (ref 22–32)
Calcium: 8 mg/dL — ABNORMAL LOW (ref 8.9–10.3)
Chloride: 105 mmol/L (ref 98–111)
Creatinine, Ser: 1.32 mg/dL — ABNORMAL HIGH (ref 0.44–1.00)
GFR calc Af Amer: 49 mL/min — ABNORMAL LOW (ref >60)
GFR calc non Af Amer: 42 mL/min — ABNORMAL LOW (ref >60)
Glucose, Bld: 90 mg/dL (ref 70–99)
Potassium: 4.4 mmol/L (ref 3.5–5.1)
Sodium: 134 mmol/L — ABNORMAL LOW (ref 135–145)

## 2018-10-13 LAB — CBC
HCT: 26.3 % — ABNORMAL LOW (ref 36.0–46.0)
Hemoglobin: 8.6 g/dL — ABNORMAL LOW (ref 12.0–15.0)
MCH: 29.7 pg (ref 26.0–34.0)
MCHC: 32.7 g/dL (ref 30.0–36.0)
MCV: 90.7 fL (ref 80.0–100.0)
Platelets: 402 10*3/uL — ABNORMAL HIGH (ref 150–400)
RBC: 2.9 MIL/uL — ABNORMAL LOW (ref 3.87–5.11)
RDW: 18.3 % — ABNORMAL HIGH (ref 11.5–15.5)
WBC: 27.5 10*3/uL — ABNORMAL HIGH (ref 4.0–10.5)
nRBC: 0 % (ref 0.0–0.2)

## 2018-10-13 LAB — IGG, IGA, IGM
IgG (Immunoglobin G), Serum: 3225 mg/dL — ABNORMAL HIGH (ref 600–1540)
IgM, Serum: 104 mg/dL (ref 50–300)
Immunoglobulin A: 155 mg/dL (ref 70–320)

## 2018-10-13 LAB — PAN-ANCA
ANCA Screen: NEGATIVE
Myeloperoxidase Abs: <1 AI
Serine Protease 3: <1 AI

## 2018-10-13 LAB — HEPATITIS C ANTIBODY
Hepatitis C Ab: NONREACTIVE
SIGNAL TO CUT-OFF: 0.15 (ref <1.00)

## 2018-10-13 LAB — HEPATITIS B SURFACE ANTIGEN: Hepatitis B Surface Ag: NONREACTIVE

## 2018-10-13 LAB — GLUCOSE, CAPILLARY
Glucose-Capillary: 70 mg/dL (ref 70–99)
Glucose-Capillary: 76 mg/dL (ref 70–99)
Glucose-Capillary: 67 mg/dL — ABNORMAL LOW (ref 70–99)
Glucose-Capillary: 92 mg/dL (ref 70–99)

## 2018-10-13 LAB — HIV ANTIBODY (ROUTINE TESTING W REFLEX): HIV 1&2 Ab, 4th Generation: NONREACTIVE

## 2018-10-13 LAB — GLUCOSE 6 PHOSPHATE DEHYDROGENASE: G-6PDH: 27.3 U/g Hgb — ABNORMAL HIGH (ref 7.0–20.5)

## 2018-10-13 LAB — THIOPURINE METHYLTRANSFERASE (TPMT), RBC: Thiopurine Methyltransferase, RBC: 17 nmol/hr/mL RBC

## 2018-10-13 LAB — HEPATITIS B CORE ANTIBODY, IGM: Hep B C IgM: NONREACTIVE

## 2018-10-13 LAB — IFE INTERPRETATION: Immunofix Electr Int: NOT DETECTED

## 2018-10-13 LAB — IGG SUBCLASS 4: IgG Subclass 4: 1261.4 mg/dL — ABNORMAL HIGH (ref 4.0–86.0)

## 2018-10-13 MED ORDER — IPRATROPIUM-ALBUTEROL 0.5-2.5 (3) MG/3ML IN SOLN
3.0000 mL | Freq: Four times a day (QID) | RESPIRATORY_TRACT | Status: DC | PRN
  Administered 2018-10-13: 3 mL via RESPIRATORY_TRACT
  Filled 2018-10-13: qty 3

## 2018-10-13 MED ORDER — IPRATROPIUM-ALBUTEROL 0.5-2.5 (3) MG/3ML IN SOLN
3.0000 mL | Freq: Two times a day (BID) | RESPIRATORY_TRACT | Status: DC
  Administered 2018-10-13 – 2018-10-15 (×4): 3 mL via RESPIRATORY_TRACT
  Filled 2018-10-13 (×4): qty 3

## 2018-10-13 NOTE — Progress Notes (Addendum)
PROGRESS NOTE    Desiree [name]  OAC:[id number] DOB: 08-May-1951 DOA: 10/09/2018 PCP: [name], MD     Brief Narrative:  Desiree [name] is a 67 year old with history of Raynaud's disease with gangrene bronchiectasis, polyarthritis with positive rheumatoid factor, GERD, hypertrophic cardiomyopathy, bipolar disorder, CKD stage III, COPD, depression, diabetes mellitus type 2 who came to the hospital with worsening of the pain in her bilateral fingers concerning for Raynaud's phenomenon. Case was discussed by the admitting provider with rheumatology at [facility] who recommended heparin gtt, Norvasc and digital Nitropaste. She was evaluated by vascular surgery and underwent arteriogram 12/20.   New events last 24 hours / Subjective: No new complaints, continues to have pain in her fingers   Assessment & Plan:   Active Problems:   Eosinophilia   Essential hypertension   GERD   Renal insufficiency   Diabetes mellitus with renal manifestations, controlled ([place])   Bipolar affective disorder, currently depressed, moderate (HCC)   Hyperlipidemia   Bronchiectasis without complication (HCC)   Leucocytosis   Gangrene of finger ([place])   Raynaud's phenomenon with gangrene (HCC)   LVH (left ventricular hypertrophy)   Bilateral digital pain secondary to Raynaud's phenomenon with gangrene -Follows with outpatient Dr. [name] from Rheumatology.Continue hydroxychloroquine -Currently she is on Norvasc 10 mg, nitroglycerin paste on fingertips -S/p arteriogram, discussed with Dr. Carlis Abbott, no reconstructive large vessel occlusive disease and no further vascular surgical intervention planned -Discussed over the phone with Dr. [name]. [name]  -Discussed with hand surgeon on call. Will keep NPO midnight, hold subq hep, and will call on-call surgeon tomorrow AM to discuss possible amputation   Chronic hypereosinophilia  -Follows with  Dr. Lindi Adie, planning for possibly bone marrow biopsy in the future   Essential hypertension -Currently on Norvasc, her ARB has been placed on hold due to soft blood pressures  Insulin-dependent diabetes mellitus with renal manifestation -SSI   Bipolar disorder -Continue cymbalta, lamictal   Hyperlipidemia -Continue lipitor   GERD -Continue Protonix   DVT prophylaxis: SCD Code Status: Full Family Communication: No family at bedside Disposition Plan: Pending possible surgical intervention   Consultants:   Vascular surgery  Procedures:   Arch aortogram bilateral upper extremity arteriogram 12/20  Antimicrobials:  Anti-infectives (From admission, onward)   Start     Dose/Rate Route Frequency Ordered Stop   10/09/18 2300  hydroxychloroquine (PLAQUENIL) tablet 200 mg    Note to Pharmacy:  Take 213m by mouth twice daily, Monday-Friday only. None on Saturday or Sunday.     20 0 mg Oral 2 times per day on Mon Tue Wed Thu Fri 10/09/18 2213         Objective: Vitals:   10/12/18 1104 10/12/18 2012 10/12/18 2037 10/13/18 0611  BP: 108/61 108/60  (!) 115/54  Pulse: 98 98  95  Resp:      Temp: 99.1 F (37.3 C) 98.9 F (37.2 C)  98.5 F (36.9 C)  TempSrc: Oral Oral  Oral  SpO2: 94% 92% 94% 94%  Weight:      Height:       No intake or output data in the 24 hours ending 10/13/18 1007 Filed Weights   10/09/18 1309 10/09/18 2213  Weight: 58.5 kg 59.2 kg    Examination: General exam: Appears calm and comfortable  Respiratory system: Clear to auscultation. Respiratory effort normal. Cardiovascular system: S1 & S2 heard, RRR. No JVD, murmurs, rubs, gallops or clicks. No pedal edema. Gastrointestinal system:  Abdomen is nondistended, soft and nontender. No organomegaly or masses felt. Normal bowel sounds heard. Central nervous system: Alert and oriented. No focal neurological deficits. Psychiatry: Judgement and insight appear normal. Mood & affect appropriate.       Data Reviewed: I have personally reviewed following labs and imaging studies  CBC: Recent Labs  Lab 10/08/18 1535 10/09/18 1330 10/10/18 0125 10/11/18 0253 10/12/18 0255 10/13/18 0345  WBC 19.7* 21.2* 25.4* 24.6* 26.7* 27.5*  NEUTROABS 5,299  --   --   --   --   --   HGB 10.2* 10.5* 9.8* 8.7* 8.9* 8.6*  HCT 31.7* 33.3* 30.0* 26.0* 27.4* 26.3*  MCV 88.3 92.0 88.8 89.7 90.4 90.7  PLT 443* 428* 387 380 383 563*   Basic Metabolic Panel: Recent Labs  Lab 10/09/18 1330 10/10/18 0125 10/12/18 0255 10/13/18 0345  NA 138 138 137 134*  K 3.6 3.6 3.7 4.4  CL 108 108 108 105  CO2 20* 24 22 24   GLUCOSE 105* 107* 96 90  BUN 16 14 10 13   CREATININE 1.40* 1.26* 1.13* 1.32*  CALCIUM 8.4* 7.9* 8.1* 8.0*  MG  --  1.8  --   --   PHOS  --  3.4  --   --    GFR: Estimated Creatinine Clearance: 34.7 mL/min (A) (by C-G formula based on SCr of 1.32 mg/dL (H)). Liver Function Tests: Recent Labs  Lab 10/08/18 1535 10/09/18 1330 10/10/18 0125 10/12/18 0255  AST  --  24 21 20   ALT  --  15 12 13   ALKPHOS  --  92 89 79  BILITOT  --  0.7 0.4 0.5  PROT 7.2 7.1 6.1* 6.1*  ALBUMIN  --  2.2* 1.9* 1.8*   No results for input(s): LIPASE, AMYLASE in the last 168 hours. No results for input(s): AMMONIA in the last 168 hours. Coagulation Profile: Recent Labs  Lab 10/09/18 1330  INR 1.16   Cardiac Enzymes: No results for input(s): CKTOTAL, CKMB, CKMBINDEX, TROPONINI in the last 168 hours. BNP (last 3 results) No results for input(s): PROBNP in the last 8760 hours. HbA1C: No results for input(s): HGBA1C in the last 72 hours. CBG: Recent Labs  Lab 10/12/18 0645 10/12/18 1154 10/12/18 1644 10/12/18 2152 10/13/18 0609  GLUCAP 81 81 82 84 70   Lipid Profile: No results for input(s): CHOL, HDL, LDLCALC, TRIG, CHOLHDL, LDLDIRECT in the last 72 hours. Thyroid Function Tests: No results for input(s): TSH, T4TOTAL, FREET4, T3FREE, THYROIDAB in the last 72 hours. Anemia Panel: No  results for input(s): VITAMINB12, FOLATE, FERRITIN, TIBC, IRON, RETICCTPCT in the last 72 hours. Sepsis Labs: No results for input(s): PROCALCITON, LATICACIDVEN in the last 168 hours.  No results found for this or any previous visit (from the past 240 hour(s)).     Radiology Studies: No results found.    Scheduled Meds: . amLODipine  10 mg Oral Daily  . angioplasty book   Does not apply Once  . aspirin EC  81 mg Oral Daily  . atorvastatin  20 mg Oral q1800  . docusate sodium  100 mg Oral QHS  . DULoxetine  90 mg Oral Daily  . guaiFENesin  600 mg Oral QHS  . heparin  5,000 Units Subcutaneous Q8H  . hydroxychloroquine  200 mg Oral 2 times per day on Mon Tue Wed Thu Fri  . insulin aspart  0-5 Units Subcutaneous QHS  . insulin aspart  0-9 Units Subcutaneous TID WC  . ipratropium-albuterol  3 mL  Nebulization BID  . lamoTRIgine  25 mg Oral BID  . montelukast  10 mg Oral QHS  . nitroGLYCERIN  0.25 inch Topical Q8H  . pantoprazole  40 mg Oral Daily  . sodium chloride flush  3 mL Intravenous Q12H  . umeclidinium-vilanterol  1 puff Inhalation Daily   Continuous Infusions: . sodium chloride       LOS: 4 days    Time spent: 25 minutes   Dessa Phi, DO [facility] [email or web address] Password [facility] 10/13/2018, 10:07 AM

## 2018-10-14 ENCOUNTER — Inpatient Hospital Stay (HOSPITAL_COMMUNITY): Payer: Medicare HMO | Admitting: Certified Registered Nurse Anesthetist

## 2018-10-14 ENCOUNTER — Inpatient Hospital Stay: Payer: Medicare HMO | Admitting: Internal Medicine

## 2018-10-14 ENCOUNTER — Encounter (HOSPITAL_COMMUNITY)
Admission: EM | Disposition: A | Discharge status: Home / Self Care | Payer: Self-pay | Source: Home / Self Care | Attending: Internal Medicine

## 2018-10-14 ENCOUNTER — Encounter (HOSPITAL_COMMUNITY): Payer: Self-pay | Admitting: Vascular Surgery

## 2018-10-14 DIAGNOSIS — R079 Chest pain, unspecified: Secondary | ICD-10-CM

## 2018-10-14 DIAGNOSIS — Z01810 Encounter for preprocedural cardiovascular examination: Secondary | ICD-10-CM

## 2018-10-14 HISTORY — PX: AMPUTATION: SHX166

## 2018-10-14 LAB — CBC WITH DIFFERENTIAL/PLATELET
Abs Immature Granulocytes: 0.03 10*3/uL (ref 0.00–0.07)
Basophils Absolute: 0.2 10*3/uL — ABNORMAL HIGH (ref 0.0–0.1)
Basophils Relative: 1 %
Eosinophils Absolute: 19 10*3/uL — ABNORMAL HIGH (ref 0.0–0.5)
Eosinophils Relative: 72 %
HCT: 27.6 % — ABNORMAL LOW (ref 36.0–46.0)
Hemoglobin: 8.8 g/dL — ABNORMAL LOW (ref 12.0–15.0)
Immature Granulocytes: 0 %
Lymphocytes Relative: 11 %
Lymphs Abs: 2.9 10*3/uL (ref 0.7–4.0)
MCH: 28.8 pg (ref 26.0–34.0)
MCHC: 31.9 g/dL (ref 30.0–36.0)
MCV: 90.2 fL (ref 80.0–100.0)
Monocytes Absolute: 0.8 10*3/uL (ref 0.1–1.0)
Monocytes Relative: 3 %
Neutro Abs: 3.4 10*3/uL (ref 1.7–7.7)
Neutrophils Relative %: 13 %
Platelets: 397 10*3/uL (ref 150–400)
RBC: 3.06 MIL/uL — ABNORMAL LOW (ref 3.87–5.11)
RDW: 18.3 % — ABNORMAL HIGH (ref 11.5–15.5)
WBC: 26.3 10*3/uL — ABNORMAL HIGH (ref 4.0–10.5)
nRBC: 0 % (ref 0.0–0.2)

## 2018-10-14 LAB — GLUCOSE, CAPILLARY
Glucose-Capillary: 69 mg/dL — ABNORMAL LOW (ref 70–99)
Glucose-Capillary: 71 mg/dL (ref 70–99)
Glucose-Capillary: 65 mg/dL — ABNORMAL LOW (ref 70–99)
Glucose-Capillary: 68 mg/dL — ABNORMAL LOW (ref 70–99)
Glucose-Capillary: 98 mg/dL (ref 70–99)
Glucose-Capillary: 89 mg/dL (ref 70–99)
Glucose-Capillary: 69 mg/dL — ABNORMAL LOW (ref 70–99)

## 2018-10-14 LAB — BASIC METABOLIC PANEL
Anion gap: 12 (ref 5–15)
BUN: 12 mg/dL (ref 8–23)
CO2: 23 mmol/L (ref 22–32)
Calcium: 7.9 mg/dL — ABNORMAL LOW (ref 8.9–10.3)
Chloride: 103 mmol/L (ref 98–111)
Creatinine, Ser: 1.31 mg/dL — ABNORMAL HIGH (ref 0.44–1.00)
GFR calc Af Amer: 49 mL/min — ABNORMAL LOW (ref >60)
GFR calc non Af Amer: 42 mL/min — ABNORMAL LOW (ref >60)
Glucose, Bld: 92 mg/dL (ref 70–99)
Potassium: 4.3 mmol/L (ref 3.5–5.1)
Sodium: 138 mmol/L (ref 135–145)

## 2018-10-14 LAB — SURGICAL PCR SCREEN
MRSA, PCR: NEGATIVE
Staphylococcus aureus: NEGATIVE

## 2018-10-14 SURGERY — AMPUTATION DIGIT
Anesthesia: Monitor Anesthesia Care | Site: Finger | Laterality: Left

## 2018-10-14 MED ORDER — BUPIVACAINE HCL (PF) 0.25 % IJ SOLN
INTRAMUSCULAR | Status: AC
  Filled 2018-10-14: qty 30

## 2018-10-14 MED ORDER — PHENYLEPHRINE 40 MCG/ML (10ML) SYRINGE FOR IV PUSH (FOR BLOOD PRESSURE SUPPORT)
PREFILLED_SYRINGE | INTRAVENOUS | Status: AC
  Filled 2018-10-14: qty 10

## 2018-10-14 MED ORDER — MIDAZOLAM HCL 2 MG/2ML IJ SOLN
INTRAMUSCULAR | Status: AC
  Filled 2018-10-14: qty 2

## 2018-10-14 MED ORDER — VANCOMYCIN HCL IN DEXTROSE 1-5 GM/200ML-% IV SOLN
1000.0000 mg | INTRAVENOUS | Status: AC
  Administered 2018-10-14: 1000 mg via INTRAVENOUS

## 2018-10-14 MED ORDER — LIDOCAINE HCL 2 % IJ SOLN
INTRAMUSCULAR | Status: DC | PRN
  Administered 2018-10-14: 5 mL

## 2018-10-14 MED ORDER — FENTANYL CITRATE (PF) 250 MCG/5ML IJ SOLN
INTRAMUSCULAR | Status: AC
  Filled 2018-10-14: qty 5

## 2018-10-14 MED ORDER — LIDOCAINE HCL 1 % IJ SOLN
INTRAMUSCULAR | Status: AC
  Filled 2018-10-14: qty 20

## 2018-10-14 MED ORDER — ONDANSETRON HCL 4 MG/2ML IJ SOLN
INTRAMUSCULAR | Status: AC
  Filled 2018-10-14: qty 2

## 2018-10-14 MED ORDER — LIDOCAINE HCL (CARDIAC) PF 100 MG/5ML IV SOSY
PREFILLED_SYRINGE | INTRAVENOUS | Status: DC | PRN
  Administered 2018-10-14 (×2): 20 mg via INTRATRACHEAL

## 2018-10-14 MED ORDER — FENTANYL CITRATE (PF) 100 MCG/2ML IJ SOLN: 25.0000 ug | INTRAMUSCULAR | Status: DC | PRN

## 2018-10-14 MED ORDER — PROPOFOL 10 MG/ML IV BOLUS
INTRAVENOUS | Status: AC
  Filled 2018-10-14: qty 20

## 2018-10-14 MED ORDER — LIDOCAINE HCL (PF) 2 % IJ SOLN
INTRAMUSCULAR | Status: AC
  Filled 2018-10-14: qty 10

## 2018-10-14 MED ORDER — BUPIVACAINE HCL (PF) 0.5 % IJ SOLN
INTRAMUSCULAR | Status: DC | PRN
  Administered 2018-10-14: 5 mL

## 2018-10-14 MED ORDER — BUPIVACAINE HCL (PF) 0.5 % IJ SOLN
INTRAMUSCULAR | Status: AC
  Filled 2018-10-14: qty 10

## 2018-10-14 MED ORDER — 0.9 % SODIUM CHLORIDE (POUR BTL) OPTIME
TOPICAL | Status: DC | PRN
  Administered 2018-10-14: 1000 mL

## 2018-10-14 MED ORDER — FENTANYL CITRATE (PF) 250 MCG/5ML IJ SOLN
INTRAMUSCULAR | Status: DC | PRN
  Administered 2018-10-14 (×4): 25 ug via INTRAVENOUS

## 2018-10-14 MED ORDER — LACTATED RINGERS IV SOLN
INTRAVENOUS | Status: DC
  Administered 2018-10-14: 19:00:00 via INTRAVENOUS

## 2018-10-14 MED ORDER — VANCOMYCIN HCL IN DEXTROSE 1-5 GM/200ML-% IV SOLN
INTRAVENOUS | Status: AC
  Administered 2018-10-14: 1000 mg via INTRAVENOUS
  Filled 2018-10-14: qty 200

## 2018-10-14 MED ORDER — PROPOFOL 10 MG/ML IV BOLUS
INTRAVENOUS | Status: DC | PRN
  Administered 2018-10-14: 20 mg via INTRAVENOUS
  Administered 2018-10-14: 10 mg via INTRAVENOUS

## 2018-10-14 SURGICAL SUPPLY — 32 items
BANDAGE ACE 4X5 VEL STRL LF (GAUZE/BANDAGES/DRESSINGS) ×2 IMPLANT
BNDG CMPR 9X4 STRL LF SNTH (GAUZE/BANDAGES/DRESSINGS) ×1
BNDG ESMARK 4X9 LF (GAUZE/BANDAGES/DRESSINGS) ×2 IMPLANT
BNDG GAUZE ELAST 4 BULKY (GAUZE/BANDAGES/DRESSINGS) ×2 IMPLANT
CHLORAPREP W/TINT 26ML (MISCELLANEOUS) ×2 IMPLANT
CORDS BIPOLAR (ELECTRODE) ×2 IMPLANT
COVER WAND RF STERILE (DRAPES) ×1 IMPLANT
DRSG ADAPTIC 3X8 NADH LF (GAUZE/BANDAGES/DRESSINGS) ×2 IMPLANT
ELECT REM PT RETURN 9FT ADLT (ELECTROSURGICAL)
ELECTRODE REM PT RTRN 9FT ADLT (ELECTROSURGICAL) IMPLANT
GAUZE SPONGE 4X4 12PLY STRL (GAUZE/BANDAGES/DRESSINGS) ×2 IMPLANT
GAUZE XEROFORM 1X8 LF (GAUZE/BANDAGES/DRESSINGS) ×1 IMPLANT
GLOVE BIOGEL M 8.0 STRL (GLOVE) ×2 IMPLANT
GOWN STRL REUS W/ TWL LRG LVL3 (GOWN DISPOSABLE) ×1 IMPLANT
GOWN STRL REUS W/ TWL XL LVL3 (GOWN DISPOSABLE) ×1 IMPLANT
GOWN STRL REUS W/TWL LRG LVL3 (GOWN DISPOSABLE) ×2
GOWN STRL REUS W/TWL XL LVL3 (GOWN DISPOSABLE) ×2
KIT BASIN OR (CUSTOM PROCEDURE TRAY) ×2 IMPLANT
KIT TURNOVER KIT B (KITS) ×2 IMPLANT
NS IRRIG 1000ML POUR BTL (IV SOLUTION) ×2 IMPLANT
PACK ORTHO EXTREMITY (CUSTOM PROCEDURE TRAY) ×2 IMPLANT
PAD ARMBOARD 7.5X6 YLW CONV (MISCELLANEOUS) ×4 IMPLANT
PAD CAST 4YDX4 CTTN HI CHSV (CAST SUPPLIES) ×1 IMPLANT
PADDING CAST COTTON 4X4 STRL (CAST SUPPLIES) ×2
SOAP 2 % CHG 4 OZ (WOUND CARE) ×2 IMPLANT
SPONGE LAP 18X18 X RAY DECT (DISPOSABLE) ×2 IMPLANT
SPONGE LAP 4X18 RFD (DISPOSABLE) ×2 IMPLANT
TOWEL OR 17X24 6PK STRL BLUE (TOWEL DISPOSABLE) ×2 IMPLANT
TOWEL OR 17X26 10 PK STRL BLUE (TOWEL DISPOSABLE) ×2 IMPLANT
TUBE CONNECTING 12X1/4 (SUCTIONS) ×1 IMPLANT
WATER STERILE IRR 1000ML POUR (IV SOLUTION) ×2 IMPLANT
YANKAUER SUCT BULB TIP NO VENT (SUCTIONS) ×1 IMPLANT

## 2018-10-14 NOTE — Anesthesia Postprocedure Evaluation (Signed)
Anesthesia Post Note  Patient: Desiree [name]  Procedure(s) Performed: AMPUTATION LEFT LONG FINGER TIP (Left Finger)     Patient location during evaluation: PACU Anesthesia Type: MAC Level of consciousness: awake Pain management: pain level controlled Vital Signs Assessment: post-procedure vital signs reviewed and stable Respiratory status: spontaneous breathing Cardiovascular status: stable Anesthetic complications: no    Last Vitals:  Vitals:   10/14/18 2030 10/14/18 2052  BP: 134/76 126/68  Pulse: 90 92  Resp: 17   Temp: 36.5 C 36.8 C  SpO2: 94%     Last Pain:  Vitals:   10/14/18 2052  TempSrc: Oral  PainSc:                  Rebbie Lauricella

## 2018-10-14 NOTE — Transfer of Care (Signed)
Immediate Anesthesia Transfer of Care Note  Patient: Desiree [name]  Procedure(s) Performed: AMPUTATION LEFT LONG FINGER TIP (Left Finger)  Patient Location: PACU  Anesthesia Type:MAC  Level of Consciousness: awake  Airway & Oxygen Therapy: Patient Spontanous Breathing  Post-op Assessment: Report given to RN and Post -op Vital signs reviewed and stable  Post vital signs: Reviewed and stable  Last Vitals:  Vitals Value Taken Time  BP    Temp    Pulse    Resp    SpO2      Last Pain:  Vitals:   10/14/18 1403  TempSrc: Oral  PainSc:       Patients Stated Pain Goal: 4 (94/[id number]/54 [id number])  Complications: No apparent anesthesia complications

## 2018-10-14 NOTE — Consult Note (Signed)
CONSULTATION NOTE   Patient Name: Desiree [name] Date of Encounter: 10/14/2018 Cardiologist: [name], MD  Chief Complaint   Chest pain, pre-op  Patient Profile   67 yo female patient of Dr. Johnsie Cancel, recently seen for what was felt to be atypical chest pain, found to have severe LVH - recent UE angiogram demonstrating radial and ulnar occlusions, now with gangrenous finger for urgent surgery. She is again complaining of chest pain.  HPI   Desiree [name] is a 67 y.o. female who is being seen today for the evaluation of chest pain at the request of Dr. Nyoka Cowden. This is a 67 year old female patient of Dr. Johnsie Cancel who was recently seen for chest pain and murmur.  She has a history of mitral valve prolapse in the past, diabetes and was a former smoker.  Her last stress test was in 2016 which was normal.  An echo from October 2019 showed severe LVH, EF 60 to 65% with grade 1 diastolic dysfunction.  She was found to have "totally atypical chest pain" by Dr. Johnsie Cancel.  It was described as "sharp and fleeting, nonexertional lasting for months, both left and right sided."  Recently she developed ulcers on numerous fingertips of the bilateral upper extremities and was found to have occlusion of the right radial artery and occlusion of the ulnar artery of the left wrist.  This was felt to be due to vasospasm secondary to underlying Raynaud's disease.  She now presents with worsening pain at the tips of her fingers and objective findings of gangrene.  Plan for surgery however today she complained of chest pain.  This was described as sharp in the center of her chest without any associated shortness of breath, diaphoresis or other symptoms.  And was quick onset and offset lasting only a few seconds.  She is also complaining of some pain in her left shoulder which is reproducible on exam.  An EKG was performed which I personally reviewed and demonstrates mild anterolateral T wave inversions.  These appear to be new  and could be ischemic or related to electrolyte abnormalities.  Another possible causes include coronary vasospasm or more than 60 other published causes of T wave inversions.  PMHx   Past Medical History:  Diagnosis Date  . Anxiety   . Bipolar disorder ([place])   . CANDIDIASIS, ESOPHAGEAL 07/28/2009   Qualifier: Diagnosis of  By: [name] MD, [name]   . Chronic kidney disease    CKD III  . COPD (chronic obstructive pulmonary disease) ([place])   . Depression   . Diabetes mellitus without complication (HCC)    no meds  . FH: colonic polyps   . Fractured elbow    right   . GERD (gastroesophageal reflux disease)   . HH (hiatus hernia)   . History of carpal tunnel syndrome   . History of chest pain   . History of transfusion of packed red blood cells   . Hyperlipidemia   . Hypertension   . Neuroleptic-induced tardive dyskinesia   . Osteoarthritis of more than one site   . Seasonal allergies     Past Surgical History:  Procedure Laterality Date  . ANTERIOR CERVICAL DECOMP/DISCECTOMY FUSION  09/27/2016   C5-6 anterior cervical discectomy and fusion, allograft and plate/notes 09/27/2016  . ANTERIOR CERVICAL DECOMP/DISCECTOMY FUSION N/A 09/27/2016   Procedure: C5-6 Anterior Cervical Discectomy and Fusion, Allograft and Plate;  Surgeon: [name], MD;  Location: [facility];  Service: Orthopedics;  Laterality: N/A;  .  Back Fusion  2002  . BIOPSY  07/19/2018   Procedure: BIOPSY;  Surgeon: [name], MD;  Location: [facility];  Service: Endoscopy;;  . CARPAL TUNNEL RELEASE  yates   left  . COLONOSCOPY N/A 01/05/2014   Procedure: COLONOSCOPY;  Surgeon: [name], MD;  Location: [facility];  Service: Endoscopy;  Laterality: N/A;  . ELBOW SURGERY     age 89  . ESOPHAGOGASTRODUODENOSCOPY (EGD) WITH PROPOFOL N/A 07/19/2018   Procedure: ESOPHAGOGASTRODUODENOSCOPY (EGD) WITH PROPOFOL;  Surgeon: [name], MD;  Location: [facility];  Service: Endoscopy;  Laterality: N/A;  . EXTERNAL EAR  SURGERY Left   . EYE SURGERY     "removed white dots under eyelid"  . FINGER SURGERY Left   . Juvara osteomy    . KNEE SURGERY    . NOSE SURGERY    . Rt. toe bunion    . skin, shave biopsy  05/03/2016   Left occipital scalp, top of scalp  . UPPER EXTREMITY ANGIOGRAPHY Bilateral 10/11/2018   Procedure: UPPER EXTREMITY ANGIOGRAPHY;  Surgeon: [name], MD;  Location: [facility];  Service: Cardiovascular;  Laterality: Bilateral;    FAMHx   Family History  Problem Relation Age of Onset  . Heart attack Father   . Heart disease Father   . Throat cancer Brother   . Diabetes Mother   . Hypertension Mother   . Heart attack Mother   . Diabetes type II Brother   . Heart disease Brother   . Lung cancer Brother   . Breast cancer Cousin   . Lung cancer Daughter   . Lung cancer Paternal Uncle     SOCHx    reports that she quit smoking about 16 years ago. Her smoking use included cigarettes. She has a 60.00 pack-year smoking history. She has never used smokeless tobacco. She reports that she does not drink alcohol or use drugs.  Outpatient Medications   No current facility-administered medications on file prior to encounter.    Current Outpatient Medications on File Prior to Encounter  Medication Sig Dispense Refill  . acetaminophen (TYLENOL) 500 MG tablet Take 2 tablets (1,000 mg total) by mouth every 6 (six) hours as needed. (Patient taking differently: Take 1,000 mg by mouth every 6 (six) hours as needed for mild pain or headache. ) 30 tablet 0  . ambrisentan (LETAIRIS) 5 MG tablet Take 1 tablet (5 mg total) by mouth daily. 30 tablet 0  . amLODipine (NORVASC) 2.5 MG tablet Take 1 tablet (2.5 mg total) by mouth daily. raynauds? (Patient taking differently: Take 2.5 mg by mouth daily as needed (if Systolic reading is >941). ) 30 tablet 1  . atorvastatin (LIPITOR) 20 MG tablet TAKE 1 TABLET BY MOUTH ONCE DAILY (Patient taking differently: Take 20 mg by mouth daily. ) 90  tablet 0  . cetirizine (ZYRTEC) 10 MG tablet Take 10 mg by mouth at bedtime.     . diclofenac sodium (VOLTAREN) 1 % GEL Apply 3 grams to 3 large joints, up to 3 times daily. (Patient taking differently: Apply 3 g topically See admin instructions. Apply 3 grams to three large joints, up to three times a day) 3 Tube 3  . docusate sodium (COLACE) 100 MG capsule Take 100 mg by mouth at bedtime.     . DULoxetine (CYMBALTA) 30 MG capsule 3 tabs qd. (Patient taking differently: Take 30 mg by mouth every morning. ) 90 capsule 1  . DULoxetine (CYMBALTA) 60 MG capsule Take 60  mg by mouth every morning.     . feeding supplement, ENSURE ENLIVE, (ENSURE ENLIVE) LIQD Take 237 mLs by mouth 3 (three) times daily between meals. (Patient taking differently: Take 237 mLs by mouth 2 (two) times daily after a meal. ) 14 Bottle 0  . fluticasone (FLONASE) 50 MCG/ACT nasal spray Place 1-2 sprays into both nostrils at bedtime.     Marland Kitchen guaiFENesin (MUCINEX) 600 MG 12 hr tablet Take 600 mg by mouth at bedtime.    . hydroxychloroquine (PLAQUENIL) 200 MG tablet Take 254m by mouth twice daily, Monday-Friday only. None on Saturday or Sunday. (Patient taking differently: Take 200 mg by mouth See admin instructions. Take 200 mg my mouth two times a day on Mon/Tues/Wed/Thurs/Fri, and nothing on Sat or Sun) 40 tablet 0  . ipratropium-albuterol (DUONEB) 0.5-2.5 (3) MG/3ML SOLN INHALE CONTENTS OF 1 VIAL THREE TIMES A DAY IN NEBULIZER (Patient taking differently: Take 3 mLs by nebulization 3 (three) times daily. ) 1080 mL 4  . lamoTRIgine (LAMICTAL) 25 MG tablet Take 1 tablet (25 mg total) by mouth 2 (two) times daily. 60 tablet 1  . linaclotide (LINZESS) 145 MCG CAPS capsule Take 145 mcg by mouth daily as needed (constipation).    .Marland Kitchenlosartan (COZAAR) 50 MG tablet Take 1 tablet (50 mg total) by mouth daily. (Patient taking differently: Take 50 mg by mouth daily as needed (if Systolic B/P reading is 1301-601or greater). ) 90 tablet 0  .  montelukast (SINGULAIR) 10 MG tablet TAKE 1 TABLET BY MOUTH AT BEDTIME (Patient taking differently: Take 10 mg by mouth at bedtime. ) 90 tablet 0  . Multiple Vitamins-Minerals (CENTRUM SILVER 50+WOMEN) TABS Take 1 tablet by mouth daily.    . NON FORMULARY Respivest: Three times a day for 30 minutes    . ondansetron (ZOFRAN-ODT) 4 MG disintegrating tablet DISSOLVE 1 TABLET(4 MG) ON THE TONGUE EVERY 8 HOURS AS NEEDED FOR NAUSEA OR VOMITING (Patient taking differently: Take 4 mg by mouth every 8 (eight) hours as needed for nausea or vomiting. ) 20 tablet 0  . OVER THE COUNTER MEDICATION Take 1 capsule by mouth See admin instructions. Fdgard capsules: Take 1 capsule by mouth once a day    . pantoprazole (PROTONIX) 40 MG tablet Take 40 mg by mouth daily.    . Probiotic Product (ALIGN) 4 MG CAPS Take 4 mg by mouth daily.    .Marland KitchenRespiratory Therapy Supplies (FLUTTER) DEVI 1 Device by Does not apply route as directed. (Patient taking differently: 1 Device as directed. ) 1 each 0  . sodium chloride HYPERTONIC 3 % nebulizer solution Take by nebulization 2 (two) times daily. (Patient taking differently: Take 4 mLs by nebulization 2 (two) times daily. ) 750 mL 5  . triamcinolone cream (KENALOG) 0.1 % Apply 1 application topically daily as needed (for itching of affected areas).   0  . umeclidinium-vilanterol (ANORO ELLIPTA) 62.5-25 MCG/INH AEPB Inhale 1 puff into the lungs daily. 60 each 5  . Blood Glucose Monitoring Suppl (ACCU-CHEK NANO SMARTVIEW) W/DEVICE KIT Use to check blood sugar 1-2 times daily (Patient taking differently: 1 strip by Other route 2 (two) times a week. ) 1 kit 0  . glucose blood test strip Accu-chek nano, test glucose once daily, dx E11.9 100 each 12  . Lancets Misc. (ACCU-CHEK FASTCLIX LANCET) KIT USE TO CHECK BLOOD SUGAR 1-2 TIMES DAILY Dx E11.9 1 kit 1  . nitroGLYCERIN (NITROGLYN) 2 % ointment Apply 1/4 inch topically to affected fingertips three  times daily. 30 g 0    Inpatient  Medications    Scheduled Meds: . [MAR Hold] amLODipine  10 mg Oral Daily  . [MAR Hold] angioplasty book   Does not apply Once  . [MAR Hold] aspirin EC  81 mg Oral Daily  . [MAR Hold] atorvastatin  20 mg Oral q1800  . [MAR Hold] docusate sodium  100 mg Oral QHS  . [MAR Hold] DULoxetine  90 mg Oral Daily  . [MAR Hold] guaiFENesin  600 mg Oral QHS  . [MAR Hold] hydroxychloroquine  200 mg Oral 2 times per day on Mon Tue Wed Thu Fri  . [MAR Hold] insulin aspart  0-5 Units Subcutaneous QHS  . [MAR Hold] insulin aspart  0-9 Units Subcutaneous TID WC  . [MAR Hold] ipratropium-albuterol  3 mL Nebulization BID  . [MAR Hold] lamoTRIgine  25 mg Oral BID  . [MAR Hold] montelukast  10 mg Oral QHS  . [MAR Hold] nitroGLYCERIN  0.25 inch Topical Q8H  . [MAR Hold] pantoprazole  40 mg Oral Daily  . [MAR Hold] sodium chloride flush  3 mL Intravenous Q12H  . [MAR Hold] umeclidinium-vilanterol  1 puff Inhalation Daily    Continuous Infusions: . [MAR Hold] sodium chloride    . lactated ringers 10 mL/hr at 10/14/18 1837    PRN Meds: [MAR Hold] sodium chloride, [MAR Hold] acetaminophen, [MAR Hold] dextrose, [MAR Hold] hydrALAZINE, [MAR Hold] ipratropium-albuterol, [MAR Hold] labetalol, [MAR Hold] linaclotide, [MAR Hold]  morphine injection, [MAR Hold] ondansetron **OR** [MAR Hold] ondansetron (ZOFRAN) IV, [MAR Hold] oxyCODONE, [MAR Hold] sodium chloride flush   ALLERGIES   Allergies  Allergen Reactions  . Prednisone Shortness Of Breath, Itching, Nausea And Vomiting and Palpitations  . Solu-Medrol [Methylprednisolone] Anaphylaxis  . Amoxicillin Hives and Rash    Has patient had a PCN reaction causing immediate rash, facial/tongue/throat swelling, SOB or lightheadedness with hypotension:Yes Has patient had a PCN reaction causing severe rash involving mucus membranes or skin necrosis: No Has patient had a PCN reaction that required hospitalization: No Has patient had a PCN reaction occurring within the  last 10 years: No If all of the above answers are "NO", then may proceed with Cephalosporin use.   . Codeine Hives, Itching and Rash    Tolerated oxycodone and morphine previously  . Hydrocodone-Acetaminophen Hives, Itching and Rash    Tolerated oxycodone and morphine previously  . Penicillins Hives, Itching and Rash    ALLERGIC REACTION TO ORAL AMOXICILLIN Has patient had a PCN reaction causing immediate rash, facial/tongue/throat swelling, SOB or lightheadedness with hypotension: Yes Has patient had a PCN reaction causing severe rash involving mucus membranes or skin necrosis: No Has patient had a PCN reaction that required hospitalization: No Has patient had a PCN reaction occurring within the last 10 years: No If all of the above answers are "NO", then may proceed with Cephalosporin use.    ROS   Pertinent items noted in HPI and remainder of comprehensive ROS otherwise negative.  Vitals   Vitals:   10/13/18 2112 10/14/18 0647 10/14/18 0905 10/14/18 1403  BP:  (!) 116/56  99/75  Pulse:  98  (!) 108  Resp:    16  Temp:  99.2 F (37.3 C)  98.4 F (36.9 C)  TempSrc:  Oral  Oral  SpO2: 93% 98% 99% 99%  Weight:      Height:        Intake/Output Summary (Last 24 hours) at 10/14/2018 1901 Last data filed at 10/14/2018 1300  Gross per 24 hour  Intake 0 ml  Output -  Net 0 ml   Filed Weights   10/09/18 1309 10/09/18 2213  Weight: 58.5 kg 59.2 kg    Physical Exam   General appearance: alert and no distress Neck: no carotid bruit, no JVD and thyroid not enlarged, symmetric, no tenderness/mass/nodules Lungs: clear to auscultation bilaterally Heart: regular rate and rhythm, S1, S2 normal and systolic murmur: early systolic 2/6, crescendo at 2nd right intercostal space Abdomen: soft, non-tender; bowel sounds normal; no masses,  no organomegaly Extremities: Multiple ulcerations and a gangrenous fingertip on the left hand Pulses: 2+ and symmetric Skin: Gangrenous changes as  described above Neurologic: Grossly normal Psych: Pleasant, appears comfortable  Labs   Results for orders placed or performed during the hospital encounter of 10/09/18 (from the past 48 hour(s))  Glucose, capillary     Status: None   Collection Time: 10/12/18  9:52 PM  Result Value Ref Range   Glucose-Capillary 84 70 - 99 mg/dL   Comment 1 Document in Chart   CBC     Status: Abnormal   Collection Time: 10/13/18  3:45 AM  Result Value Ref Range   WBC 27.5 (H) 4.0 - 10.5 K/uL   RBC 2.90 (L) 3.87 - 5.11 MIL/uL   Hemoglobin 8.6 (L) 12.0 - 15.0 g/dL   HCT 26.3 (L) 36.0 - 46.0 %   MCV 90.7 80.0 - 100.0 fL   MCH 29.7 26.0 - 34.0 pg   MCHC 32.7 30.0 - 36.0 g/dL   RDW 18.3 (H) 11.5 - 15.5 %   Platelets 402 (H) 150 - 400 K/uL   nRBC 0.0 0.0 - 0.2 %    Comment: Performed at [facility], [address] [place], [place] [address]  Basic metabolic panel     Status: Abnormal   Collection Time: 10/13/18  3:45 AM  Result Value Ref Range   Sodium 134 (L) 135 - 145 mmol/L   Potassium 4.4 3.5 - 5.1 mmol/L   Chloride 105 98 - 111 mmol/L   CO2 24 22 - 32 mmol/L   Glucose, Bld 90 70 - 99 mg/dL   BUN 13 8 - 23 mg/dL   Creatinine, Ser 1.32 (H) 0.44 - 1.00 mg/dL   Calcium 8.0 (L) 8.9 - 10.3 mg/dL   GFR calc non Af Amer 42 (L) >60 mL/min   GFR calc Af Amer 49 (L) >60 mL/min   Anion gap 5 5 - 15    Comment: Performed at [facility] [address] [place], [place] [address]  Glucose, capillary     Status: None   Collection Time: 10/13/18  6:09 AM  Result Value Ref Range   Glucose-Capillary 70 70 - 99 mg/dL   Comment 1 Document in Chart   Glucose, capillary     Status: None   Collection Time: 10/13/18 11:16 AM  Result Value Ref Range   Glucose-Capillary 76 70 - 99 mg/dL  Glucose, capillary     Status: Abnormal   Collection Time: 10/13/18  4:53 PM  Result Value Ref Range   Glucose-Capillary 67 (L) 70 - 99 mg/dL  Glucose, capillary     Status: None   Collection Time: 10/13/18   9:31 PM  Result Value Ref Range   Glucose-Capillary 92 70 - 99 mg/dL   Comment 1 Document in Chart   Surgical pcr screen     Status: None   Collection Time: 10/14/18  1:12 AM  Result  Value Ref Range   MRSA, PCR NEGATIVE NEGATIVE   Staphylococcus aureus NEGATIVE NEGATIVE    Comment: (NOTE) The Xpert SA Assay (FDA approved for NASAL specimens in patients 31 years of age and older), is one component of a comprehensive surveillance program. It is not intended to diagnose infection nor to guide or monitor treatment. Performed at [facility], [place] [address], [place], [place] [address]   Basic metabolic panel     Status: Abnormal   Collection Time: 10/14/18  2:37 AM  Result Value Ref Range   Sodium 138 135 - 145 mmol/L   Potassium 4.3 3.5 - 5.1 mmol/L   Chloride 103 98 - 111 mmol/L   CO2 23 22 - 32 mmol/L   Glucose, Bld 92 70 - 99 mg/dL   BUN 12 8 - 23 mg/dL   Creatinine, Ser 1.31 (H) 0.44 - 1.00 mg/dL   Calcium 7.9 (L) 8.9 - 10.3 mg/dL   GFR calc non Af Amer 42 (L) >60 mL/min   GFR calc Af Amer 49 (L) >60 mL/min   Anion gap 12 5 - 15    Comment: Performed at [facility] [address], [place], [place] [address]  CBC with Differential/Platelet     Status: Abnormal   Collection Time: 10/14/18  2:37 AM  Result Value Ref Range   WBC 26.3 (H) 4.0 - 10.5 K/uL   RBC 3.06 (L) 3.87 - 5.11 MIL/uL   Hemoglobin 8.8 (L) 12.0 - 15.0 g/dL   HCT 27.6 (L) 36.0 - 46.0 %   MCV 90.2 80.0 - 100.0 fL   MCH 28.8 26.0 - 34.0 pg   MCHC 31.9 30.0 - 36.0 g/dL   RDW 18.3 (H) 11.5 - 15.5 %   Platelets 397 150 - 400 K/uL   nRBC 0.0 0.0 - 0.2 %   Neutrophils Relative % 13 %   Neutro Abs 3.4 1.7 - 7.7 K/uL   Lymphocytes Relative 11 %   Lymphs Abs 2.9 0.7 - 4.0 K/uL   Monocytes Relative 3 %   Monocytes Absolute 0.8 0.1 - 1.0 K/uL   Eosinophils Relative 72 %   Eosinophils Absolute 19.0 (H) 0.0 - 0.5 K/uL   Basophils Relative 1 %   Basophils Absolute 0.2 (H) 0.0 - 0.1 K/uL   Immature  Granulocytes 0 %   Abs Immature Granulocytes 0.03 0.00 - 0.07 K/uL   Polychromasia PRESENT     Comment: Performed at [facility], [place] [address], [place], [place] [address]  Glucose, capillary     Status: Abnormal   Collection Time: 10/14/18  6:49 AM  Result Value Ref Range   Glucose-Capillary 69 (L) 70 - 99 mg/dL   Comment 1 Document in Chart   Glucose, capillary     Status: None   Collection Time: 10/14/18  8:13 AM  Result Value Ref Range   Glucose-Capillary 71 70 - 99 mg/dL  Glucose, capillary     Status: Abnormal   Collection Time: 10/14/18 11:36 AM  Result Value Ref Range   Glucose-Capillary 65 (L) 70 - 99 mg/dL  Glucose, capillary     Status: Abnormal   Collection Time: 10/14/18  4:01 PM  Result Value Ref Range   Glucose-Capillary 68 (L) 70 - 99 mg/dL  Glucose, capillary     Status: None   Collection Time: 10/14/18  6:07 PM  Result Value Ref Range   Glucose-Capillary 98 70 - 99 mg/dL    ECG  Normal sinus at 81, anterior T wave inversions- Personally Reviewed  Telemetry   Sinus rhythm- Personally Reviewed  Radiology   No results found.  Cardiac Studies   Deferred  Impression   1. Active Problems: 2.   Eosinophilia 3.   Essential hypertension 4.   GERD 5.   Renal insufficiency 6.   Diabetes mellitus with renal manifestations, controlled ([place]) 7.   Bipolar affective disorder, currently depressed, moderate ([place]) 8.   Hyperlipidemia 9.   Bronchiectasis without complication ([place]) 10.   Leucocytosis 11.   Gangrene of finger ([place]) 12.   Raynaud's phenomenon with gangrene ([place]) 13.   LVH (left ventricular hypertrophy) 14.   Recommendation   1. Mrs. Sze was recently seen by Dr. Johnsie Cancel on 09/23/18 and felt to have atypical chest pain.  He did not recommend cardiac work-up.  Echo in October did show severe LVH.  Although she has multiple coronary risk factors, her chest pain is very atypical which is short in duration, sharp and not associated with  exertion.  Her EKG however does show some anterior T wave inversions.  This is new compared to prior EKGs.  The etiology is not clear at could be seen for multiple reasons including ischemia.  One possibility is coronary vasospasm.  She ready is on a calcium channel blocker.  She is currently Chest pain-free.  Because of her short duration episode and the fact that her pain is somewhat atypical, I also feel like she is at acceptable risk for surgery. She had an EGD in 06/2018 with propofol and no complication. We could further minimize her risk by operating under a local block versus general anesthesia.  She is already on calcium channel blocker and could benefit from the addition of a low-dose beta-blocker.  She has PRN labetalol.  If she were to have T wave changes during surgery, would recommend IV nitroglycerin.  We will plan follow-up with her postoperatively tomorrow.  I discussed my recommendations with Dr. Nyoka Cowden (anesthesia) and Dr. Lenon Curt (plastic surgery).   Thanks for the consultation. Cardiology will follow with you.  Time Spent Directly with Patient:  I have spent a total of 45 minutes with the patient reviewing hospital notes, telemetry, EKGs, labs and examining the patient as well as establishing an assessment and plan that was discussed personally with the patient.  > 50% of time was spent in direct patient care.  Length of Stay:  LOS: 5 days   Pixie Casino, MD, [facility], [place] Director of the [facility]  Card[facility]ovascular Risk [facility] Diplomate of the American Board of Clinical Lipidology Attending Cardiologist  Direct Dial: [phone]  Fax: [phone]  Website:  [email or web address]  10/14/2018, 7:01 PM

## 2018-10-14 NOTE — Consult Note (Signed)
Reason for Consult:finger ischemia Referring Physician: Hospitalist  CC:My finger hurts  HPI:  Desiree [name] is an 67 y.o. right handed female who presents with finger pain/sichemia.  Pt has peripheral vascular disease, admitted for pain and finger tip ischemia.   Currently most sever finger is LLF, then RLF  Pain is rated at    8/10 and is described as sharp.  Pain is constant.  Pain is made better by rest/immobilization, worse with motion.   Associated signs/symptoms: multiple medical problems Previous treatment:  Recent Arteriogram  Past Medical History:  Diagnosis Date  . Anxiety   . Bipolar disorder (HCC)   . CANDIDIASIS, ESOPHAGEAL 07/28/2009   Qualifier: Diagnosis of  By: Fabian SharpPanosh MD, Neta MendsWanda K   . Chronic kidney disease    CKD III  . COPD (chronic obstructive pulmonary disease) (HCC)   . Depression   . Diabetes mellitus without complication (HCC)    no meds  . FH: colonic polyps   . Fractured elbow    right   . GERD (gastroesophageal reflux disease)   . HH (hiatus hernia)   . History of carpal tunnel syndrome   . History of chest pain   . History of transfusion of packed red blood cells   . Hyperlipidemia   . Hypertension   . Neuroleptic-induced tardive dyskinesia   . Osteoarthritis of more than one site   . Seasonal allergies     Past Surgical History:  Procedure Laterality Date  . ANTERIOR CERVICAL DECOMP/DISCECTOMY FUSION  09/27/2016   C5-6 anterior cervical discectomy and fusion, allograft and plate/notes 09/8/119112/03/2016  . ANTERIOR CERVICAL DECOMP/DISCECTOMY FUSION N/A 09/27/2016   Procedure: C5-6 Anterior Cervical Discectomy and Fusion, Allograft and Plate;  Surgeon: Eldred MangesMark C Yates, MD;  Location: [facility] OR;  Service: Orthopedics;  Laterality: N/A;  . Back Fusion  2002  . BIOPSY  07/19/2018   Procedure: BIOPSY;  Surgeon: Jeani HawkingHung, Patrick, MD;  [facility];  Service: Endoscopy;;  . CARPAL TUNNEL RELEASE  yates   left  . COLONOSCOPY N/A 01/05/2014   Procedure:  COLONOSCOPY;  Surgeon: Charna ElizabethJyothi Mann, MD;  Location: [facility] ENDOSCOPY;  Service: Endoscopy;  Laterality: N/A;  . ELBOW SURGERY     age 89  . ESOPHAGOGASTRODUODENOSCOPY (EGD) WITH PROPOFOL N/A 07/19/2018   Procedure: ESOPHAGOGASTRODUODENOSCOPY (EGD) WITH PROPOFOL;  Surgeon: Jeani HawkingHung, Patrick, MD;  [facility];  Service: Endoscopy;  Laterality: N/A;  . EXTERNAL EAR SURGERY Left   . EYE SURGERY     "removed white dots under eyelid"  . FINGER SURGERY Left   . Juvara osteomy    . KNEE SURGERY    . NOSE SURGERY    . Rt. toe bunion    . skin, shave biopsy  05/03/2016   Left occipital scalp, top of scalp  . UPPER EXTREMITY ANGIOGRAPHY Bilateral 10/11/2018   Procedure: UPPER EXTREMITY ANGIOGRAPHY;  Surgeon: Sherren KernsFields, Charles E, MD;  Location: [facility] INVASIVE CV LAB;  Service: Cardiovascular;  Laterality: Bilateral;    Family History  Problem Relation Age of Onset  . Heart attack Father   . Heart disease Father   . Throat cancer Brother   . Diabetes Mother   . Hypertension Mother   . Heart attack Mother   . Diabetes type II Brother   . Heart disease Brother   . Lung cancer Brother   . Breast cancer Cousin   . Lung cancer Daughter   . Lung cancer Paternal Uncle     Social History:  reports that she  quit smoking about 16 years ago. Her smoking use included cigarettes. She has a 60.00 pack-year smoking history. She has never used smokeless tobacco. She reports that she does not drink alcohol or use drugs.  Allergies:  Allergies  Allergen Reactions  . Prednisone Shortness Of Breath, Itching, Nausea And Vomiting and Palpitations  . Solu-Medrol [Methylprednisolone] Anaphylaxis  . Amoxicillin Hives and Rash    Has patient had a PCN reaction causing immediate rash, facial/tongue/throat swelling, SOB or lightheadedness with hypotension:Yes Has patient had a PCN reaction causing severe rash involving mucus membranes or skin necrosis: No Has patient had a PCN reaction that required hospitalization:  No Has patient had a PCN reaction occurring within the last 10 years: No If all of the above answers are "NO", then may proceed with Cephalosporin use.   . Codeine Hives, Itching and Rash    Tolerated oxycodone and morphine previously  . Hydrocodone-Acetaminophen Hives, Itching and Rash    Tolerated oxycodone and morphine previously  . Penicillins Hives, Itching and Rash    ALLERGIC REACTION TO ORAL AMOXICILLIN Has patient had a PCN reaction causing immediate rash, facial/tongue/throat swelling, SOB or lightheadedness with hypotension: Yes Has patient had a PCN reaction causing severe rash involving mucus membranes or skin necrosis: No Has patient had a PCN reaction that required hospitalization: No Has patient had a PCN reaction occurring within the last 10 years: No If all of the above answers are "NO", then may proceed with Cephalosporin use.    Medications: I have reviewed the patient's current medications.  Results for orders placed or performed during the hospital encounter of 10/09/18 (from the past 48 hour(s))  Glucose, capillary     Status: None   Collection Time: 10/12/18  9:52 PM  Result Value Ref Range   Glucose-Capillary 84 70 - 99 mg/dL   Comment 1 Document in Chart   CBC     Status: Abnormal   Collection Time: 10/13/18  3:45 AM  Result Value Ref Range   WBC 27.5 (H) 4.0 - 10.5 K/uL   RBC 2.90 (L) 3.87 - 5.11 MIL/uL   Hemoglobin 8.6 (L) 12.0 - 15.0 g/dL   HCT 25.8 (L) 52.7 - 78.2 %   MCV 90.7 80.0 - 100.0 fL   MCH 29.7 26.0 - 34.0 pg   MCHC 32.7 30.0 - 36.0 g/dL   RDW 42.3 (H) 53.6 - 14.4 %   Platelets 402 (H) 150 - 400 K/uL   nRBC 0.0 0.0 - 0.2 %    Comment: Performed at [facility], [address], [place], [place] [address]  Basic metabolic panel     Status: Abnormal   Collection Time: 10/13/18  3:45 AM  Result Value Ref Range   Sodium 134 (L) 135 - 145 mmol/L   Potassium 4.4 3.5 - 5.1 mmol/L   Chloride 105 98 - 111 mmol/L   CO2 24 22 - 32 mmol/L    Glucose, Bld 90 70 - 99 mg/dL   BUN 13 8 - 23 mg/dL   Creatinine, Ser 0.86 (H) 0.44 - 1.00 mg/dL   Calcium 8.0 (L) 8.9 - 10.3 mg/dL   GFR calc non Af Amer 42 (L) >60 mL/min   GFR calc Af Amer 49 (L) >60 mL/min   Anion gap 5 5 - 15    Comment: Performed at [facility], [address], [place], [place] [address]  Glucose, capillary     Status: None   Collection Time: 10/13/18  6:09  AM  Result Value Ref Range   Glucose-Capillary 70 70 - 99 mg/dL   Comment 1 Document in Chart   Glucose, capillary     Status: None   Collection Time: 10/13/18 11:16 AM  Result Value Ref Range   Glucose-Capillary 76 70 - 99 mg/dL  Glucose, capillary     Status: Abnormal   Collection Time: 10/13/18  4:53 PM  Result Value Ref Range   Glucose-Capillary 67 (L) 70 - 99 mg/dL  Glucose, capillary     Status: None   Collection Time: 10/13/18  9:31 PM  Result Value Ref Range   Glucose-Capillary 92 70 - 99 mg/dL   Comment 1 Document in Chart   Surgical pcr screen     Status: None   Collection Time: 10/14/18  1:12 AM  Result Value Ref Range   MRSA, PCR NEGATIVE NEGATIVE   Staphylococcus aureus NEGATIVE NEGATIVE    Comment: (NOTE) The Xpert SA Assay (FDA approved for NASAL specimens in patients 21 years of age and older), is one component of a comprehensive surveillance program. It is not intended to diagnose infection nor to guide or monitor treatment. Performed at [facility], [address], [place], [place] [address]   Basic metabolic panel     Status: Abnormal   Collection Time: 10/14/18  2:37 AM  Result Value Ref Range   Sodium 138 135 - 145 mmol/L   Potassium 4.3 3.5 - 5.1 mmol/L   Chloride 103 98 - 111 mmol/L   CO2 23 22 - 32 mmol/L   Glucose, Bld 92 70 - 99 mg/dL   BUN 12 8 - 23 mg/dL   Creatinine, Ser 2.06 (H) 0.44 - 1.00 mg/dL   Calcium 7.9 (L) 8.9 - 10.3 mg/dL   GFR calc non Af Amer 42 (L) >60 mL/min   GFR calc Af Amer 49 (L) >60 mL/min   Anion gap 12 5 - 15    Comment:  Performed at [facility], [address], [place], [place] [address]  CBC with Differential/Platelet     Status: Abnormal   Collection Time: 10/14/18  2:37 AM  Result Value Ref Range   WBC 26.3 (H) 4.0 - 10.5 K/uL   RBC 3.06 (L) 3.87 - 5.11 MIL/uL   Hemoglobin 8.8 (L) 12.0 - 15.0 g/dL   HCT 53.7 (L) 94.3 - 27.6 %   MCV 90.2 80.0 - 100.0 fL   MCH 28.8 26.0 - 34.0 pg   MCHC 31.9 30.0 - 36.0 g/dL   RDW 14.7 (H) 09.2 - 95.7 %   Platelets 397 150 - 400 K/uL   nRBC 0.0 0.0 - 0.2 %   Neutrophils Relative % 13 %   Neutro Abs 3.4 1.7 - 7.7 K/uL   Lymphocytes Relative 11 %   Lymphs Abs 2.9 0.7 - 4.0 K/uL   Monocytes Relative 3 %   Monocytes Absolute 0.8 0.1 - 1.0 K/uL   Eosinophils Relative 72 %   Eosinophils Absolute 19.0 (H) 0.0 - 0.5 K/uL   Basophils Relative 1 %   Basophils Absolute 0.2 (H) 0.0 - 0.1 K/uL   Immature Granulocytes 0 %   Abs Immature Granulocytes 0.03 0.00 - 0.07 K/uL   Polychromasia PRESENT     Comment: Performed at [facility], [address] [place], [place] [address]  Glucose, capillary     Status: Abnormal   Collection Time: 10/14/18  6:49 AM  Result Value Ref Range   Glucose-Capillary 69 (  L) 70 - 99 mg/dL   Comment 1 Document in Chart   Glucose, capillary     Status: None   Collection Time: 10/14/18  8:13 AM  Result Value Ref Range   Glucose-Capillary 71 70 - 99 mg/dL  Glucose, capillary     Status: Abnormal   Collection Time: 10/14/18 11:36 AM  Result Value Ref Range   Glucose-Capillary 65 (L) 70 - 99 mg/dL  Glucose, capillary     Status: Abnormal   Collection Time: 10/14/18  4:01 PM  Result Value Ref Range   Glucose-Capillary 68 (L) 70 - 99 mg/dL    No results found.  Pertinent items are noted in HPI. Temp:  [98.4 F (36.9 C)-99.2 F (37.3 C)] 98.4 F (36.9 C) (12/23 1403) Pulse Rate:  [97-108] 108 (12/23 1403) Resp:  [16] 16 (12/23 1403) BP: (99-116)/(56-75) 99/75 (12/23 1403) SpO2:  [93 %-99 %] 99 % (12/23 1403) General  appearance: alert and cooperative Resp: clear to auscultation bilaterally Cardio: regular rate and rhythm GI: soft, non-tender; bowel sounds normal; no masses,  no organomegaly Extremities: bilateral fingers with multiple digits with delayed cap refill, LLF with dry gangrene, to mid distal phalanx, RLF with some whitish discoloration, ischemic changes without gangrene Findings of arteriogram noted !  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  The patient understands that additional treatment may be necessary.  Johnette Abraham 10/14/2018, 5:35 PM

## 2018-10-14 NOTE — Discharge Instructions (Signed)
Keep finger dressing clean and dry, do not remove until seen in office for f/u.  If dressing becomes wet, call office during business hours for appointment.

## 2018-10-14 NOTE — Progress Notes (Signed)
Orange juice with 3 sugar packets given for BS of 69.

## 2018-10-14 NOTE — Progress Notes (Signed)
S: pt with CP in preop holding, states she has had this before; Anesthesiologist notified, cardiologist called, EKG performed.  O:Blood pressure 99/75, pulse (!) 108, temperature 98.4 F (36.9 C), temperature source Oral, resp. rate 16, height 5\' 3"  (1.6 m), weight 59.2 kg, SpO2 99 %.    A:  CP in pre op holding - atypical per cardiologist - see his note   P: will proceed with procedure as scheduled, local with sedation.

## 2018-10-14 NOTE — Progress Notes (Signed)
Report given to Rebecca, CRNA 

## 2018-10-14 NOTE — Anesthesia Preprocedure Evaluation (Addendum)
Anesthesia Evaluation    Airway Mallampati: II  TM Distance: >3 FB     Dental   Pulmonary pneumonia, resolved, former smoker,    breath sounds clear to auscultation       Cardiovascular hypertension,  Rhythm:Regular Rate:Normal  Hv of atypical chest pain. C/O chest pain on arrival, Dr. Samantha Crimes notified and Dr. [name] called for Cardiac Consult who evaluated patient in [place] [address]. Informed to be ok to proceed and ECG reviewed. Belinda Block, MD     Neuro/Psych    GI/Hepatic hiatal hernia, GERD  ,  Endo/Other  diabetes  Renal/GU Renal disease     Musculoskeletal   Abdominal   Peds  Hematology   Anesthesia Other Findings   Reproductive/Obstetrics                            Anesthesia Physical Anesthesia Plan  ASA: IV  Anesthesia Plan: MAC   Post-op Pain Management:    Induction: Intravenous  PONV Risk Score and Plan: Treatment may vary due to age or medical condition and Propofol infusion  Airway Management Planned: Nasal Cannula and Simple Face Mask  Additional Equipment:   Intra-op Plan:   Post-operative Plan:   Informed Consent:   Dental advisory given  Plan Discussed with: CRNA, Anesthesiologist and Surgeon  Anesthesia Plan Comments:        Anesthesia Quick Evaluation

## 2018-10-14 NOTE — Progress Notes (Addendum)
PROGRESS NOTE    Desiree [name]  TKW:[id number] DOB: 16-Nov-1950 DOA: 10/09/2018 PCP: [name], MD     Brief Narrative:  Desiree [name] is a 67 year old with history of Raynaud's disease with gangrene bronchiectasis, polyarthritis with positive rheumatoid factor, GERD, hypertrophic cardiomyopathy, bipolar disorder, CKD stage III, COPD, depression, diabetes mellitus type 2 who came to the hospital with worsening of the pain in her bilateral fingers concerning for Raynaud's phenomenon. Case was discussed by the admitting provider with rheumatology at [facility] who recommended heparin gtt, Norvasc and digital Nitropaste. She was evaluated by vascular surgery and underwent arteriogram 12/20: no reconstructive large vessel occlusive disease and no further vascular surgical intervention planned  New events last 24 hours / Subjective: No acute issues overnight.  Assessment & Plan:   Active Problems:   Eosinophilia   Essential hypertension   GERD   Renal insufficiency   Diabetes mellitus with renal manifestations, controlled ([place])   Bipolar affective disorder, currently depressed, moderate (HCC)   Hyperlipidemia   Bronchiectasis without complication (HCC)   Leucocytosis   Gangrene of finger ([place])   Raynaud's phenomenon with gangrene (HCC)   LVH (left ventricular hypertrophy)   Bilateral digital pain secondary to Raynaud's phenomenon with gangrene -Follows with outpatient Dr. [name] from Rheumatology.Continue hydroxychloroquine -Currently she is on Norvasc 10 mg, nitroglycerin paste on fingertips -S/p arteriogram, no reconstructive large vessel occlusive disease and no further vascular surgical intervention planned -Discussed over the phone with Dr. [name] (Rheumatology). [name]  -Consulted hand surgery, Dr. Lenon Curt  Chronic hypereosinophilia  -Follows with Dr. Lindi Adie, planning for possibly bone marrow biopsy in  the future   Essential hypertension -Currently on Norvasc, her ARB has been placed on hold due to soft blood pressures  Insulin-dependent diabetes mellitus with renal manifestation -SSI   Bipolar disorder -Continue cymbalta, lamictal   Hyperlipidemia -Continue lipitor   GERD -Continue Protonix   DVT prophylaxis: SCD Code Status: Full Family Communication: No family at bedside Disposition Plan: Pending possible surgical intervention   Consultants:   Vascular surgery  Hand surgery  Procedures:   Arch aortogram bilateral upper extremity arteriogram 12/20  Antimicrobials:  Anti-infectives (From admission, onward)   Start     Dose/Rate Route Frequency Ordered Stop   10/09/18 2300  hydroxychloroquine (PLAQUENIL) tablet 200 mg    Note to Pharmacy:  Take 236m by mouth twice daily, Monday-Friday only. None on Saturday or _0 0 mg Oral 2 times per day on Mon Tue Wed Thu Fri 10/09/18 2213         Objective: Vitals:   10/13/18 2040 10/13/18 2112 10/14/18 0647 10/14/18 0905  BP: (!) 106/56  (!) 116/56   Pulse: 97  98   Resp:      Temp: 98.5 F (36.9 C)  99.2 F (37.3 C)   TempSrc: Oral  Oral   SpO2: 96% 93% 98% 99%  Weight:      Height:        Intake/Output Summary (Last 24 hours) at 10/14/2018 1017 Last data filed at 10/14/2018 0900 Gross per 24 hour  Intake 0 ml  Output -  Net 0 ml   Filed Weights   10/09/18 1309 10/09/18 2213  Weight: 58.5 kg 59.2 kg   Examination: General exam: Appears calm and comfortable  Respiratory system: Clear to auscultation. Respiratory effort normal. Cardiovascular system: S1 & S2 heard, RRR. No JVD, murmurs, rubs, gallops or clicks. No pedal edema.  Gastrointestinal system: Abdomen is nondistended, soft and nontender. No organomegaly or masses felt. Normal bowel sounds heard. Central nervous system: Alert and oriented. No focal neurological deficits. Extremities: Digital ulceration and ischemic change of left  middle finger  Psychiatry: Judgement and insight appear normal. Mood & affect appropriate.    Data Reviewed: I have personally reviewed following labs and imaging studies  CBC: Recent Labs  Lab 10/08/18 1535  10/10/18 0125 10/11/18 0253 10/12/18 0255 10/13/18 0345 10/14/18 0237  WBC 19.7*   < > 25.4* 24.6* 26.7* 27.5* 26.3*  NEUTROABS 5,299  --   --   --   --   --  3.4  HGB 10.2*   < > 9.8* 8.7* 8.9* 8.6* 8.8*  HCT 31.7*   < > 30.0* 26.0* 27.4* 26.3* 27.6*  MCV 88.3   < > 88.8 89.7 90.4 90.7 90.2  PLT 443*   < > 387 380 383 402* 397   < > = values in this interval not displayed.   Basic Metabolic Panel: Recent Labs  Lab 10/09/18 1330 10/10/18 0125 10/12/18 0255 10/13/18 0345 10/14/18 0237  NA 138 138 137 134* 138  K 3.6 3.6 3.7 4.4 4.3  CL 108 108 108 105 103  CO2 20* _0 GLUCOSE 105* 107* 96 90 92  BUN _1 CREATININE 1.40* 1.26* 1.13* 1.32* 1.31*  CALCIUM 8.4* 7.9* 8.1* 8.0* 7.9*  MG  --  1.8  --   --   --   PHOS  --  3.4  --   --   --    GFR: Estimated Creatinine Clearance: 34.9 mL/min (A) (by C-G formula based on SCr of 1.31 mg/dL (H)). Liver Function Tests: Recent Labs  Lab 10/08/18 1535 10/09/18 1330 10/10/18 0125 10/12/18 0255  AST  --  _2 ALT  --  _3 ALKPHOS  --  92 89 79  BILITOT  --  0.7 0.4 0.5  PROT 7.2 7.1 6.1* 6.1*  ALBUMIN  --  2.2* 1.9* 1.8*   No results for input(s): LIPASE, AMYLASE in the last 168 hours. No results for input(s): AMMONIA in the last 168 hours. Coagulation Profile: Recent Labs  Lab 10/09/18 1330  INR 1.16   Cardiac Enzymes: No results for input(s): CKTOTAL, CKMB, CKMBINDEX, TROPONINI in the last 168 hours. BNP (last 3 results) No results for input(s): PROBNP in the last 8760 hours. HbA1C: No results for input(s): HGBA1C in the last 72 hours. CBG: Recent Labs  Lab 10/13/18 1116 10/13/18 1653 10/13/18 2131 10/14/18 0649 10/14/18 0813  GLUCAP 76 67* 92 69* 71   Lipid  Profile: No results for input(s): CHOL, HDL, LDLCALC, TRIG, CHOLHDL, LDLDIRECT in the last 72 hours. Thyroid Function Tests: No results for input(s): TSH, T4TOTAL, FREET4, T3FREE, THYROIDAB in the last 72 hours. Anemia Panel: No results for input(s): VITAMINB12, FOLATE, FERRITIN, TIBC, IRON, RETICCTPCT in the last 72 hours. Sepsis Labs: No results for input(s): PROCALCITON, LATICACIDVEN in the last 168 hours.  Recent Results (from the past 240 hour(s))  Surgical pcr screen     Status: None   Collection Time: 10/14/18  1:12 AM  Result Value Ref Range Status   MRSA, PCR NEGATIVE NEGATIVE Final   Staphylococcus aureus NEGATIVE NEGATIVE Final    Comment: (NOTE) The Xpert SA Assay (FDA approved for NASAL specimens in patients 36 years of age and older), is one component of a comprehensive surveillance program. It is  not intended to diagnose infection nor to guide or monitor treatment. Performed at [facility], [place] [address], [place], [place] [address]        Radiology Studies: No results found.    Scheduled Meds: . amLODipine  10 mg Oral Daily  . angioplasty book   Does not apply Once  . aspirin EC  81 mg Oral Daily  . atorvastatin  20 mg Oral q1800  . docusate sodium  100 mg Oral QHS  . DULoxetine  90 mg Oral Daily  . guaiFENesin  600 mg Oral QHS  . hydroxychloroquine  200 mg Oral 2 times per day on Mon Tue Wed Thu Fri  . insulin aspart  0-5 Units Subcutaneous QHS  . insulin aspart  0-9 Units Subcutaneous TID WC  . ipratropium-albuterol  3 mL Nebulization BID  . lamoTRIgine  25 mg Oral BID  . montelukast  10 mg Oral QHS  . nitroGLYCERIN  0.25 inch Topical Q8H  . pantoprazole  40 mg Oral Daily  . sodium chloride flush  3 mL Intravenous Q12H  . umeclidinium-vilanterol  1 puff Inhalation Daily   Continuous Infusions: . sodium chloride       LOS: 5 days    Time spent: 25 minutes   Dessa Phi, DO [facility] [email or web address] Password  [facility] 10/14/2018, 10:17 AM

## 2018-10-14 NOTE — Progress Notes (Signed)
Patient reported while in Short Stay she had an episode of chest pain that was sharp in nature in the center of her chest without shob, diaphoresis, or other associated symptoms. Dr. Chilton Si at bedside when same happened. EKG obtained per Dr. Chilton Si. Patient NSR on monitor without ectopy. Paged on call [facility] to Dr. Chilton Si.

## 2018-10-15 ENCOUNTER — Encounter (HOSPITAL_COMMUNITY): Payer: Self-pay | Admitting: General Surgery

## 2018-10-15 DIAGNOSIS — R0789 Other chest pain: Secondary | ICD-10-CM

## 2018-10-15 LAB — GLUCOSE, CAPILLARY
Glucose-Capillary: 110 mg/dL — ABNORMAL HIGH (ref 70–99)
Glucose-Capillary: 64 mg/dL — ABNORMAL LOW (ref 70–99)
Glucose-Capillary: 66 mg/dL — ABNORMAL LOW (ref 70–99)
Glucose-Capillary: 76 mg/dL (ref 70–99)
Glucose-Capillary: 77 mg/dL (ref 70–99)
Glucose-Capillary: 84 mg/dL (ref 70–99)
Glucose-Capillary: 92 mg/dL (ref 70–99)

## 2018-10-15 LAB — CBC WITH DIFFERENTIAL/PLATELET
Abs Immature Granulocytes: 0.04 10*3/uL (ref 0.00–0.07)
Basophils Absolute: 0.2 10*3/uL — ABNORMAL HIGH (ref 0.0–0.1)
Basophils Relative: 1 %
Eosinophils Absolute: 20.2 10*3/uL — ABNORMAL HIGH (ref 0.0–0.5)
Eosinophils Relative: 73 %
HCT: 26.2 % — ABNORMAL LOW (ref 36.0–46.0)
Hemoglobin: 8.5 g/dL — ABNORMAL LOW (ref 12.0–15.0)
Immature Granulocytes: 0 %
Lymphocytes Relative: 9 %
Lymphs Abs: 2.5 10*3/uL (ref 0.7–4.0)
MCH: 29.2 pg (ref 26.0–34.0)
MCHC: 32.4 g/dL (ref 30.0–36.0)
MCV: 90 fL (ref 80.0–100.0)
Monocytes Absolute: 0.8 10*3/uL (ref 0.1–1.0)
Monocytes Relative: 3 %
Neutro Abs: 3.9 10*3/uL (ref 1.7–7.7)
Neutrophils Relative %: 14 %
Platelets: 444 10*3/uL — ABNORMAL HIGH (ref 150–400)
RBC: 2.91 MIL/uL — ABNORMAL LOW (ref 3.87–5.11)
RDW: 18.1 % — ABNORMAL HIGH (ref 11.5–15.5)
WBC: 27.5 10*3/uL — ABNORMAL HIGH (ref 4.0–10.5)
nRBC: 0 % (ref 0.0–0.2)

## 2018-10-15 LAB — BASIC METABOLIC PANEL
Anion gap: 6 (ref 5–15)
BUN: 11 mg/dL (ref 8–23)
CO2: 25 mmol/L (ref 22–32)
Calcium: 8.1 mg/dL — ABNORMAL LOW (ref 8.9–10.3)
Chloride: 102 mmol/L (ref 98–111)
Creatinine, Ser: 1.21 mg/dL — ABNORMAL HIGH (ref 0.44–1.00)
GFR calc Af Amer: 54 mL/min — ABNORMAL LOW (ref >60)
GFR calc non Af Amer: 47 mL/min — ABNORMAL LOW (ref >60)
Glucose, Bld: 80 mg/dL (ref 70–99)
Potassium: 4 mmol/L (ref 3.5–5.1)
Sodium: 133 mmol/L — ABNORMAL LOW (ref 135–145)

## 2018-10-15 NOTE — Progress Notes (Signed)
DAILY PROGRESS NOTE   Patient Name: Desiree [name] Date of Encounter: 10/15/2018  Chief Complaint   No chest pain  Patient Profile   67 yo female patient of Dr. Eden Emms, recently seen for what was felt to be atypical chest pain, found to have severe LVH - recent UE angiogram demonstrating radial and ulnar occlusions, now with gangrenous finger for urgent surgery. She is again complaining of chest pain.  Subjective   No chest pain overnight. Did well with surgery and local block. Suspect T wave changes were non-specific  Objective   Vitals:   10/14/18 2020 10/14/18 2030 10/14/18 2052 10/15/18 0527  BP: 124/76 134/76 126/68 (!) 110/57  Pulse: 92 90 92 90  Resp: 14 17  18   Temp:  97.7 F (36.5 C) 98.3 F (36.8 C) 98.3 F (36.8 C)  TempSrc:   Oral Oral  SpO2: 95% 94%  96%  Weight:      Height:        Intake/Output Summary (Last 24 hours) at 10/15/2018 0824 Last data filed at 10/15/2018 0428 Gross per 24 hour  Intake 280 ml  Output 5 ml  Net 275 ml   Filed Weights   10/09/18 1309 10/09/18 2213  Weight: 58.5 kg 59.2 kg    Physical Exam   General appearance: alert and no distress Lungs: clear to auscultation bilaterally Heart: regular rate and rhythm, S1, S2 normal, no murmur, click, rub or gallop Extremities: extremities normal, atraumatic, no cyanosis or edema and bandaged finger from surgery Neurologic: Grossly normal  Inpatient Medications    Scheduled Meds: . amLODipine  10 mg Oral Daily  . angioplasty book   Does not apply Once  . aspirin EC  81 mg Oral Daily  . atorvastatin  20 mg Oral q1800  . docusate sodium  100 mg Oral QHS  . DULoxetine  90 mg Oral Daily  . guaiFENesin  600 mg Oral QHS  . hydroxychloroquine  200 mg Oral 2 times per day on Mon Tue Wed Thu Fri  . insulin aspart  0-5 Units Subcutaneous QHS  . insulin aspart  0-9 Units Subcutaneous TID WC  . ipratropium-albuterol  3 mL Nebulization BID  . lamoTRIgine  25 mg Oral BID  .  montelukast  10 mg Oral QHS  . nitroGLYCERIN  0.25 inch Topical Q8H  . pantoprazole  40 mg Oral Daily  . sodium chloride flush  3 mL Intravenous Q12H  . umeclidinium-vilanterol  1 puff Inhalation Daily    Continuous Infusions: . sodium chloride    . lactated ringers 10 mL/hr at 10/14/18 1837    PRN Meds: sodium chloride, acetaminophen, dextrose, hydrALAZINE, ipratropium-albuterol, labetalol, linaclotide, morphine injection, ondansetron **OR** ondansetron (ZOFRAN) IV, oxyCODONE, sodium chloride flush   Labs   Results for orders placed or performed during the hospital encounter of 10/09/18 (from the past 48 hour(s))  Glucose, capillary     Status: None   Collection Time: 10/13/18 11:16 AM  Result Value Ref Range   Glucose-Capillary 76 70 - 99 mg/dL  Glucose, capillary     Status: Abnormal   Collection Time: 10/13/18  4:53 PM  Result Value Ref Range   Glucose-Capillary 67 (L) 70 - 99 mg/dL  Glucose, capillary     Status: None   Collection Time: 10/13/18  9:31 PM  Result Value Ref Range   Glucose-Capillary 92 70 - 99 mg/dL   Comment 1 Document in Chart   Surgical pcr screen     Status: None  Collection Time: 10/14/18  1:12 AM  Result Value Ref Range   MRSA, PCR NEGATIVE NEGATIVE   Staphylococcus aureus NEGATIVE NEGATIVE    Comment: (NOTE) The Xpert SA Assay (FDA approved for NASAL specimens in patients 39 years of age and older), is one component of a comprehensive surveillance program. It is not intended to diagnose infection nor to guide or monitor treatment. Performed at [facility], [address], [place], [place] [address]   Basic metabolic panel     Status: Abnormal   Collection Time: 10/14/18  2:37 AM  Result Value Ref Range   Sodium 138 135 - 145 mmol/L   Potassium 4.3 3.5 - 5.1 mmol/L   Chloride 103 98 - 111 mmol/L   CO2 23 22 - 32 mmol/L   Glucose, Bld 92 70 - 99 mg/dL   BUN 12 8 - 23 mg/dL   Creatinine, Ser 1.85 (H) 0.44 - 1.00 mg/dL   Calcium 7.9  (L) 8.9 - 10.3 mg/dL   GFR calc non Af Amer 42 (L) >60 mL/min   GFR calc Af Amer 49 (L) >60 mL/min   Anion gap 12 5 - 15    Comment: Performed at [facility], [address], [place], [place] [address]  CBC with Differential/Platelet     Status: Abnormal   Collection Time: 10/14/18  2:37 AM  Result Value Ref Range   WBC 26.3 (H) 4.0 - 10.5 K/uL   RBC 3.06 (L) 3.87 - 5.11 MIL/uL   Hemoglobin 8.8 (L) 12.0 - 15.0 g/dL   HCT 70.2 (L) 63.7 - 85.8 %   MCV 90.2 80.0 - 100.0 fL   MCH 28.8 26.0 - 34.0 pg   MCHC 31.9 30.0 - 36.0 g/dL   RDW 85.0 (H) 27.7 - 41.2 %   Platelets 397 150 - 400 K/uL   nRBC 0.0 0.0 - 0.2 %   Neutrophils Relative % 13 %   Neutro Abs 3.4 1.7 - 7.7 K/uL   Lymphocytes Relative 11 %   Lymphs Abs 2.9 0.7 - 4.0 K/uL   Monocytes Relative 3 %   Monocytes Absolute 0.8 0.1 - 1.0 K/uL   Eosinophils Relative 72 %   Eosinophils Absolute 19.0 (H) 0.0 - 0.5 K/uL   Basophils Relative 1 %   Basophils Absolute 0.2 (H) 0.0 - 0.1 K/uL   Immature Granulocytes 0 %   Abs Immature Granulocytes 0.03 0.00 - 0.07 K/uL   Polychromasia PRESENT     Comment: Performed at [facility], [address] [place], [place] [address]  Glucose, capillary     Status: Abnormal   Collection Time: 10/14/18  6:49 AM  Result Value Ref Range   Glucose-Capillary 69 (L) 70 - 99 mg/dL   Comment 1 Document in Chart   Glucose, capillary     Status: None   Collection Time: 10/14/18  8:13 AM  Result Value Ref Range   Glucose-Capillary 71 70 - 99 mg/dL  Glucose, capillary     Status: Abnormal   Collection Time: 10/14/18 11:36 AM  Result Value Ref Range   Glucose-Capillary 65 (L) 70 - 99 mg/dL  Glucose, capillary     Status: Abnormal   Collection Time: 10/14/18  4:01 PM  Result Value Ref Range   Glucose-Capillary 68 (L) 70 - 99 mg/dL  Glucose, capillary     Status: None   Collection Time: 10/14/18  6:07 PM  Result Value Ref Range   Glucose-Capillary 98 70 -  99 mg/dL  Glucose, capillary      Status: None   Collection Time: 10/14/18  8:06 PM  Result Value Ref Range   Glucose-Capillary 89 70 - 99 mg/dL   Comment 1 Notify RN   Glucose, capillary     Status: Abnormal   Collection Time: 10/14/18 10:27 PM  Result Value Ref Range   Glucose-Capillary 69 (L) 70 - 99 mg/dL  Glucose, capillary     Status: None   Collection Time: 10/14/18 11:59 PM  Result Value Ref Range   Glucose-Capillary 84 70 - 99 mg/dL  Basic metabolic panel     Status: Abnormal   Collection Time: 10/15/18  3:18 AM  Result Value Ref Range   Sodium 133 (L) 135 - 145 mmol/L   Potassium 4.0 3.5 - 5.1 mmol/L   Chloride 102 98 - 111 mmol/L   CO2 25 22 - 32 mmol/L   Glucose, Bld 80 70 - 99 mg/dL   BUN 11 8 - 23 mg/dL   Creatinine, Ser 5.46 (H) 0.44 - 1.00 mg/dL   Calcium 8.1 (L) 8.9 - 10.3 mg/dL   GFR calc non Af Amer 47 (L) >60 mL/min   GFR calc Af Amer 54 (L) >60 mL/min   Anion gap 6 5 - 15    Comment: Performed at [facility], [address] [place], [place] [address]  CBC with Differential/Platelet     Status: Abnormal   Collection Time: 10/15/18  3:18 AM  Result Value Ref Range   WBC 27.5 (H) 4.0 - 10.5 K/uL   RBC 2.91 (L) 3.87 - 5.11 MIL/uL   Hemoglobin 8.5 (L) 12.0 - 15.0 g/dL   HCT 00.9 (L) 38.1 - 82.9 %   MCV 90.0 80.0 - 100.0 fL   MCH 29.2 26.0 - 34.0 pg   MCHC 32.4 30.0 - 36.0 g/dL   RDW 93.7 (H) 16.9 - 67.8 %   Platelets 444 (H) 150 - 400 K/uL   nRBC 0.0 0.0 - 0.2 %   Neutrophils Relative % 14 %   Neutro Abs 3.9 1.7 - 7.7 K/uL   Lymphocytes Relative 9 %   Lymphs Abs 2.5 0.7 - 4.0 K/uL   Monocytes Relative 3 %   Monocytes Absolute 0.8 0.1 - 1.0 K/uL   Eosinophils Relative 73 %   Eosinophils Absolute 20.2 (H) 0.0 - 0.5 K/uL   Basophils Relative 1 %   Basophils Absolute 0.2 (H) 0.0 - 0.1 K/uL   Immature Granulocytes 0 %   Abs Immature Granulocytes 0.04 0.00 - 0.07 K/uL   Polychromasia PRESENT     Comment: Performed at [facility], [address], [place], [place] [address]   Glucose, capillary     Status: Abnormal   Collection Time: 10/15/18  6:36 AM  Result Value Ref Range   Glucose-Capillary 66 (L) 70 - 99 mg/dL  Glucose, capillary     Status: None   Collection Time: 10/15/18  7:08 AM  Result Value Ref Range   Glucose-Capillary 77 70 - 99 mg/dL    ECG   N/A  Telemetry   Sinus rhythm - Personally Reviewed  Radiology    No results found.  Cardiac Studies   N/A  Assessment   1. Active Problems: 2.   Eosinophilia 3.   Essential hypertension 4.   GERD 5.   Renal insufficiency 6.   Diabetes mellitus with renal manifestations, controlled (HCC) 7.   Bipolar affective disorder, currently depressed, moderate (HCC) 8.  Hyperlipidemia 9.   Bronchiectasis without complication (HCC) 10.   Leucocytosis 11.   Gangrene of finger (HCC) 12.   Raynaud's phenomenon with gangrene (HCC) 13.   LVH (left ventricular hypertrophy) 14.   Plan   1. No further chest pain. No further cardiac work-up at this time. Ok to follow-up with Dr. Eden Emms after discharge.  Thanks for allowing [place] to participate in her care.  [facility] will sign off.   Medication Recommendations:  No changes Other recommendations (labs, testing, etc):  None Follow up as an outpatient:  Dr. Eden Emms  Time Spent Directly with Patient:  I have spent a total of 15 minutes with the patient reviewing hospital notes, telemetry, EKGs, labs and examining the patient as well as establishing an assessment and plan that was discussed personally with the patient.  > 50% of time was spent in direct patient care.  Length of Stay:  LOS: 6 days   Chrystie Nose, MD, [facility], FACP  [place]  [facility]  Medical Director of the [facility] Lipid Disorders &  Cardiovascular Risk Reduction Clinic Diplomate of the American Board of Clinical Lipidology Attending Cardiologist  Direct Dial: [phone]  Fax: [phone]  Website:  [email or web address] 10/15/2018, 8:24  AM

## 2018-10-15 NOTE — Progress Notes (Addendum)
PROGRESS NOTE    Desiree [name]  IWL:[id number] DOB: [birth year] DOA: 10/09/2018 PCP: [name], MD     Brief Narrative:  Desiree [name] is a 67 year old with history of Raynaud's disease with gangrene bronchiectasis, polyarthritis with positive rheumatoid factor, GERD, hypertrophic cardiomyopathy, bipolar disorder, CKD stage III, COPD, depression, diabetes mellitus type 2 who came to the hospital with worsening of the pain in her bilateral fingers concerning for Raynaud's phenomenon. Case was discussed by the admitting provider with rheumatology at [facility] who recommended heparin gtt, Norvasc and digital Nitropaste. She was evaluated by vascular surgery and underwent arteriogram 12/20: no reconstructive large vessel occlusive disease and no further vascular surgical intervention planned.   New events last 24 hours / Subjective: She underwent amputation of distal phalanx of left long finger. She had some CP prior to surgery, currently chest pain free. This morning, she complains of nausea, only ate few bites of grits   Assessment & Plan:   Principal Problem:   Raynaud's phenomenon with gangrene ([place]) Active Problems:   Eosinophilia   Essential hypertension   GERD   Renal insufficiency   Diabetes mellitus with renal manifestations, controlled ([place])   Bipolar affective disorder, currently depressed, moderate (HCC)   Hyperlipidemia   Bronchiectasis without complication (HCC)   Leucocytosis   Gangrene of finger (HCC)   LVH (left ventricular hypertrophy)   Bilateral digital pain secondary to Raynaud's phenomenon with gangrene -Follows with outpatient Dr. [name] from Rheumatology.Continue hydroxychloroquine -Currently she is on Norvasc 10 mg, nitroglycerin paste on fingertips -S/p arteriogram, no reconstructive large vessel occlusive disease and no further vascular surgical intervention planned -Discussed over the phone with Dr. [name] (Rheumatology) as well  -Consulted  hand surgery, Dr. Lenon Curt. 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 Lindi Adie, planning for possibly bone marrow biopsy in the future   Essential hypertension -Currently on Norvasc, her ARB has been placed on hold due to soft blood pressures  Insulin-dependent diabetes mellitus with renal manifestation -SSI   Bipolar disorder -Continue cymbalta, lamictal   Hyperlipidemia -Continue lipitor   GERD -Continue Protonix   DVT prophylaxis: SCD Code Status: Full Family Communication: No family at bedside Disposition Plan: Hopefully discharge home 12/25 if stable from surgical stand point, nausea well controlled and tolerating PO    Consultants:   Vascular surgery  Hand surgery  Cardiology   Procedures:   Arch aortogram bilateral upper extremity arteriogram 12/20  Amputation distal phalanx left long finger 12/24 Dr. Lenon Curt   Antimicrobials:  Anti-infectives (From admission, onward)   Start     Dose/Rate Route Frequency Ordered Stop   10/14/18 1915  vancomycin (VANCOCIN) IVPB 1000 mg/200 mL premix     1,000 mg 200 mL/hr over 60 Minutes Intravenous To [facility] 10/14/18 1902 10/14/18 2217   10/09/18 2300  hydroxychloroquine (PLAQUENIL) tablet 200 mg    Note to Pharmacy:  Take 250m by mouth twice daily, Monday-Friday only. None on Saturday or Sunday.     20 0 mg Oral 2 times per day on Mon Tue Wed Thu Fri 10/09/18 2213         Objective: Vitals:   10/14/18 2020 10/14/18 2030 10/14/18 2052 10/15/18 0527  BP: 124/76 134/76 126/68 (!) 110/57  Pulse: 92 90 92 90  Resp: 14 17  18   Temp:  97.7 F (36.5 C) 98.3 F (36.8 C) 98.3 F (36.8 C)  TempSrc:   Oral Oral  SpO2: 95% 94%  96%  Weight:      Height:        Intake/Output Summary (Last 24 hours) at 10/15/2018 1107 Last data filed at 10/15/2018 0428 Gross per 24 hour  Intake 280 ml  Output 5 ml  Net 275 ml   Filed Weights    10/09/18 1309 10/09/18 2213  Weight: 58.5 kg 59.2 kg   Examination: General exam: Appears calm, vomit bag in hand  Respiratory system: Clear to auscultation. Respiratory effort normal. Cardiovascular system: S1 & S2 heard, RRR. No JVD, murmurs, rubs, gallops or clicks. No pedal edema. Gastrointestinal system: Abdomen is nondistended, soft and nontender. No organomegaly or masses felt. Normal bowel sounds heard. Central nervous system: Alert and oriented. No focal neurological deficits. Extremities: +left middle finger wrapped in clean and dry  Psychiatry: Judgement and insight appear normal. Mood & affect appropriate.    Data Reviewed: I have personally reviewed following labs and imaging studies  CBC: Recent Labs  Lab 10/08/18 1535  10/11/18 0253 10/12/18 0255 10/13/18 0345 10/14/18 0237 10/15/18 0318  WBC 19.7*   < > 24.6* 26.7* 27.5* 26.3* 27.5*  NEUTROABS 5,299  --   --   --   --  3.4 3.9  HGB 10.2*   < > 8.7* 8.9* 8.6* 8.8* 8.5*  HCT 31.7*   < > 26.0* 27.4* 26.3* 27.6* 26.2*  MCV 88.3   < > 89.7 90.4 90.7 90.2 90.0  PLT 443*   < > 380 383 402* 397 444*   < > = values in this interval not displayed.   Basic Metabolic Panel: Recent Labs  Lab 10/10/18 0125 10/12/18 0255 10/13/18 0345 10/14/18 0237 10/15/18 0318  NA 138 137 134* 138 133*  K 3.6 3.7 4.4 4.3 4.0  CL 108 108 105 103 102  CO2 24 22 24 23 25   GLUCOSE 107* 96 90 92 80  BUN 14 10 13 12 11   CREATININE 1.26* 1.13* 1.32* 1.31* 1.21*  CALCIUM 7.9* 8.1* 8.0* 7.9* 8.1*  MG 1.8  --   --   --   --   PHOS 3.4  --   --   --   --    GFR: Estimated Creatinine Clearance: 37.8 mL/min (A) (by C-G formula based on SCr of 1.21 mg/dL (H)). Liver Function Tests: Recent Labs  Lab 10/08/18 1535 10/09/18 1330 10/10/18 0125 10/12/18 0255  AST  --  24 21 20   ALT  --  15 12 13   ALKPHOS  --  92 89 79  BILITOT  --  0.7 0.4 0.5  PROT 7.2 7.1 6.1* 6.1*  ALBUMIN  --  2.2* 1.9* 1.8*   No results for input(s): LIPASE,  AMYLASE in the last 168 hours. No results for input(s): AMMONIA in the last 168 hours. Coagulation Profile: Recent Labs  Lab 10/09/18 1330  INR 1.16   Cardiac Enzymes: No results for input(s): CKTOTAL, CKMB, CKMBINDEX, TROPONINI in the last 168 hours. BNP (last 3 results) No results for input(s): PROBNP in the last 8760 hours. HbA1C: No results for input(s): HGBA1C in the last 72 hours. CBG: Recent Labs  Lab 10/14/18 2006 10/14/18 2227 10/14/18 2359 10/15/18 0636 10/15/18 0708  GLUCAP 89 69* 84 66* 77   Lipid Profile: No results for input(s): CHOL, HDL, LDLCALC, TRIG, CHOLHDL, LDLDIRECT in the last 72 hours. Thyroid Function Tests: No results for input(s): TSH, T4TOTAL, FREET4, T3FREE, THYROIDAB in the last 72 hours. Anemia Panel: No results for input(s): VITAMINB12, FOLATE, FERRITIN, TIBC, IRON, RETICCTPCT  in the last 72 hours. Sepsis Labs: No results for input(s): PROCALCITON, LATICACIDVEN in the last 168 hours.  Recent Results (from the past 240 hour(s))  Surgical pcr screen     Status: None   Collection Time: 10/14/18  1:12 AM  Result Value Ref Range Status   MRSA, PCR NEGATIVE NEGATIVE Final   Staphylococcus aureus NEGATIVE NEGATIVE Final    Comment: (NOTE) The Xpert SA Assay (FDA approved for NASAL specimens in patients 65 years of age and older), is one component of a comprehensive surveillance program. It is not intended to diagnose infection nor to guide or monitor treatment. Performed at [facility], [address], [place], [place] [address]        Radiology Studies: No results found.    Scheduled Meds: . amLODipine  10 mg Oral Daily  . angioplasty book   Does not apply Once  . aspirin EC  81 mg Oral Daily  . atorvastatin  20 mg Oral q1800  . docusate sodium  100 mg Oral QHS  . DULoxetine  90 mg Oral Daily  . guaiFENesin  600 mg Oral QHS  . hydroxychloroquine  200 mg Oral 2 times per day on Mon Tue Wed Thu Fri  . insulin aspart  0-5  Units Subcutaneous QHS  . insulin aspart  0-9 Units Subcutaneous TID WC  . ipratropium-albuterol  3 mL Nebulization BID  . lamoTRIgine  25 mg Oral BID  . montelukast  10 mg Oral QHS  . nitroGLYCERIN  0.25 inch Topical Q8H  . pantoprazole  40 mg Oral Daily  . sodium chloride flush  3 mL Intravenous Q12H  . umeclidinium-vilanterol  1 puff Inhalation Daily   Continuous Infusions: . sodium chloride    . lactated ringers 10 mL/hr at 10/14/18 1837     LOS: 6 days    Time spent: 20 minutes   Dessa Phi, DO [facility] [email or web address] Password [email or web address] 10/15/2018, 11:07 AM

## 2018-10-15 NOTE — Op Note (Signed)
NAME: Desiree [name], Desiree [name] MEDICAL RECORD [id number] ACCOUNT [id number] DATE OF BIRTH:06/19/1951 FACILITY: [facility] LOCATION: [facility] PHYSICIAN:Sherry Rogus Harle Battiest, MD  OPERATIVE REPORT  DATE OF PROCEDURE:  10/14/2018  PREOPERATIVE DIAGNOSIS:  Ischemia with dry gangrene to the left long fingertip.  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  PROCEDURE:  Amputation of distal phalanx of the left long finger.  ANESTHESIA:  Local with IV sedation.    COMPLICATIONS:  No acute complications.  SPECIMENS:  Fingertip disposed.  INDICATIONS:  The patient is a 67 year old female who suffers from peripheral vascular disease.  She was admitted the other day in the hospital with ischemia to her fingers and also noticed to have dry gangrene, particularly of the left long finger.   This finger is very painful.  She underwent an arteriogram for possible bypass, but there were no suitable distal vessels and therefore, I was consulted for possible amputation.  Risks, benefits and alternatives of amputation were discussed with the  patient.  She agreed with these and consent was obtained.  DESCRIPTION OF PROCEDURE:  The patient was taken to the operating room and placed supine on the operating room table.  IV sedation was administered.  The left upper extremity was prepped and draped in normal sterile fashion.  After timeout was performed,  local anesthetic consisting of 2% lidocaine mixed with 0.5% plain Bupivacaine was infiltrated around the base of the finger  of the tendon sheath for a finger block.  After allowing this to sit for a short period of time, the amputation was planned.  A  fish mouth type incision was made proximal to the gangrenous portions of the fingertip.  Soft tissues were elevated.  The entire distal phalanx down to the DIP joint was removed.  The neurovascular bundles were cauterized.  Of note, there was minimal  bleeding; however, there was some bleeding.   The amputation skin flaps were then fashioned and a rotation flap was used to cover the bone.  This was done without any due tension and closed with multiple interrupted 4-0 nylon sutures.  Sterile dressing  was placed.    The patient tolerated the procedure well without any complications.  AN/NUANCE  D:10/14/2018 T:10/15/2018 JOB:[id number]

## 2018-10-16 LAB — CBC WITH DIFFERENTIAL/PLATELET
Band Neutrophils: 0 %
Basophils Absolute: 0.3 10*3/uL — ABNORMAL HIGH (ref 0.0–0.1)
Basophils Relative: 1 %
Blasts: 0 %
Eosinophils Absolute: 19.1 10*3/uL — ABNORMAL HIGH (ref 0.0–0.5)
Eosinophils Relative: 71 %
HCT: 25.4 % — ABNORMAL LOW (ref 36.0–46.0)
Hemoglobin: 8.1 g/dL — ABNORMAL LOW (ref 12.0–15.0)
Lymphocytes Relative: 11 %
Lymphs Abs: 3 10*3/uL (ref 0.7–4.0)
MCH: 28.7 pg (ref 26.0–34.0)
MCHC: 31.9 g/dL (ref 30.0–36.0)
MCV: 90.1 fL (ref 80.0–100.0)
Metamyelocytes Relative: 0 %
Monocytes Absolute: 0.8 10*3/uL (ref 0.1–1.0)
Monocytes Relative: 3 %
Myelocytes: 0 %
Neutro Abs: 3.8 10*3/uL (ref 1.7–7.7)
Neutrophils Relative %: 14 %
Other: 0 %
Platelets: 459 10*3/uL — ABNORMAL HIGH (ref 150–400)
Promyelocytes Relative: 0 %
RBC: 2.82 MIL/uL — ABNORMAL LOW (ref 3.87–5.11)
RDW: 18 % — ABNORMAL HIGH (ref 11.5–15.5)
WBC: 27 10*3/uL — ABNORMAL HIGH (ref 4.0–10.5)
nRBC: 0 % (ref 0.0–0.2)
nRBC: 0 /100 WBC

## 2018-10-16 LAB — BASIC METABOLIC PANEL
Anion gap: 7 (ref 5–15)
BUN: 10 mg/dL (ref 8–23)
CO2: 28 mmol/L (ref 22–32)
Calcium: 8.1 mg/dL — ABNORMAL LOW (ref 8.9–10.3)
Chloride: 100 mmol/L (ref 98–111)
Creatinine, Ser: 1.29 mg/dL — ABNORMAL HIGH (ref 0.44–1.00)
GFR calc Af Amer: 50 mL/min — ABNORMAL LOW (ref >60)
GFR calc non Af Amer: 43 mL/min — ABNORMAL LOW (ref >60)
Glucose, Bld: 88 mg/dL (ref 70–99)
Potassium: 4.5 mmol/L (ref 3.5–5.1)
Sodium: 135 mmol/L (ref 135–145)

## 2018-10-16 LAB — GLUCOSE, CAPILLARY: Glucose-Capillary: 85 mg/dL (ref 70–99)

## 2018-10-16 MED ORDER — IPRATROPIUM-ALBUTEROL 0.5-2.5 (3) MG/3ML IN SOLN
3.0000 mL | Freq: Three times a day (TID) | RESPIRATORY_TRACT | Status: DC
  Filled 2018-10-16: qty 3

## 2018-10-16 MED ORDER — OXYCODONE HCL 5 MG PO TABS: 5.0000 mg | ORAL_TABLET | Freq: Four times a day (QID) | ORAL | 0 refills | Status: AC | PRN

## 2018-10-16 MED ORDER — HYDROXYCHLOROQUINE SULFATE 200 MG PO TABS: 200.0000 mg | ORAL_TABLET | ORAL | 0 refills | Status: DC

## 2018-10-16 MED ORDER — AMLODIPINE BESYLATE 10 MG PO TABS: 10.0000 mg | ORAL_TABLET | Freq: Every day | ORAL | 0 refills | Status: DC

## 2018-10-16 NOTE — Plan of Care (Signed)
  Problem: Education: Goal: Knowledge of General Education information will improve Description Including pain rating scale, medication(s)/side effects and non-pharmacologic comfort measures Outcome: Progressing   Problem: Health Behavior/Discharge Planning: Goal: Ability to manage health-related needs will improve Outcome: Progressing   Problem: Activity: Goal: Risk for activity intolerance will decrease Outcome: Progressing   Problem: Nutrition: Goal: Adequate nutrition will be maintained Outcome: Progressing   Problem: Coping: Goal: Level of anxiety will decrease Outcome: Progressing   Problem: Pain Managment: Goal: General experience of comfort will improve Outcome: Progressing   

## 2018-10-16 NOTE — Discharge Summary (Addendum)
Physician Discharge Summary  Desiree [name] SWH:[id number] DOB: 1951/03/18 DOA: 10/09/2018  PCP: [name], MD  Admit date: 10/09/2018 Discharge date: 10/16/2018  Admitted From: Home Disposition:  Home  Recommendations for Outpatient Follow-up:  1. Follow up with PCP in 1 week 2. Follow up with Dr. Clayton Bibles Surgery in 2 weeks  3. Follow up with Dr. [name] Rheumatology in 1-2 weeks  4. Follow up with Dr. Lindi Adie Hematology as scheduled on 12/10/2018  5. Follow up with Dr. Johnsie Cancel Cardiology as needed   Discharge Condition: Stable CODE STATUS: Full  Diet recommendation: Regular   Brief/Interim Summary: Desiree [name] is a 67 year old with history of Raynaud's disease with gangrene bronchiectasis, polyarthritis with positive rheumatoid factor, GERD, hypertrophic cardiomyopathy, bipolar disorder, CKD stage III, COPD, depression, diabetes mellitus type 2 who came to the hospital with worsening of the pain in her bilateral fingers concerning for Raynaud's phenomenon. Case was discussed by the admitting provider with rheumatology at [facility] who recommended heparin gtt, Norvasc and digital Nitropaste. She was evaluated by vascular surgery and underwent arteriogram 12/20: no reconstructive large vessel occlusive disease and no further vascular surgical intervention planned. 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 Lenon Curt on 12/23.   Discharge Diagnoses:  Principal Problem:   Raynaud's phenomenon with gangrene ([place]) Active Problems:   Eosinophilia   Essential hypertension   GERD   Renal insufficiency   Diabetes mellitus with renal manifestations, controlled (HCC)   Bipolar affective disorder, currently depressed, moderate (HCC)   Hyperlipidemia   Bronchiectasis without complication (HCC)   Leucocytosis   Gangrene of finger (HCC)   LVH (left ventricular hypertrophy)   Bilateral digital pain secondary to Raynaud's phenomenon with gangrene -Follows  with outpatient Dr. [name] from Rheumatology.Continue hydroxychloroquine -Currently she is on Norvasc 10 mg, nitroglycerin paste on fingertips -S/p arteriogram, no reconstructive large vessel occlusive disease and no further vascular surgical intervention planned -Discussed over the phone with Dr. [name] (Rheumatology) as well  -Consulted hand surgery, Dr. Lenon Curt. 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 Lindi Adie, planning for possibly bone marrow biopsy in the future   Essential hypertension -Currently on Norvasc,her ARB has been placed on hold due to soft blood pressures  Atypical chest pain -Developed prior to surgery. Evaluated by cardiology. No cardiac work up planned, follow up with Dr. Johnsie Cancel as needed   Insulin-dependent diabetes mellitus with renal manifestation -SSI   Bipolar disorder -Continue cymbalta, lamictal   Hyperlipidemia -Continue lipitor   GERD -Continue Protonix  Discharge Instructions  Discharge Instructions    Call MD for:  difficulty breathing, headache or visual disturbances   Complete by:  As directed    Call MD for:  extreme fatigue   Complete by:  As directed    Call MD for:  hives   Complete by:  As directed    Call MD for:  persistant dizziness or light-headedness   Complete by:  As directed    Call MD for:  persistant nausea and vomiting   Complete by:  As directed    Call MD for:  redness, tenderness, or signs of infection (pain, swelling, redness, odor or green/yellow discharge around incision site)   Complete by:  As directed    Call MD for:  severe uncontrolled pain   Complete by:  As directed    Call MD for:  temperature >100.4   Complete by:  As directed  Diet general   Complete by:  As directed    Discharge instructions   Complete by:  As directed    You were cared for by a hospitalist during your hospital stay. If you have any questions  about your discharge medications or the care you received while you were in the hospital after you are discharged, you can call the unit and ask to speak with the hospitalist on call if the hospitalist that took care of you is not available. Once you are discharged, your primary care physician will handle any further medical issues. Please note that NO REFILLS for any discharge medications will be authorized once you are discharged, as it is imperative that you return to your primary care physician (or establish a relationship with a primary care physician if you do not have one) for your aftercare needs so that they can reassess your need for medications and monitor your lab values.   Increase activity slowly   Complete by:  As directed      Allergies as of 10/16/2018      Reactions   Prednisone Shortness Of Breath, Itching, Nausea And Vomiting, Palpitations   Solu-medrol [methylprednisolone] Anaphylaxis   Amoxicillin Hives, Rash   Has patient had a PCN reaction causing immediate rash, facial/tongue/throat swelling, SOB or lightheadedness with hypotension:Yes Has patient had a PCN reaction causing severe rash involving mucus membranes or skin necrosis: No Has patient had a PCN reaction that required hospitalization: No Has patient had a PCN reaction occurring within the last 10 years: No If all of the above answers are "NO", then may proceed with Cephalosporin use.   Codeine Hives, Itching, Rash   Tolerated oxycodone and morphine previously   Hydrocodone-acetaminophen Hives, Itching, Rash   Tolerated oxycodone and morphine previously   Penicillins Hives, Itching, Rash   ALLERGIC REACTION TO ORAL AMOXICILLIN Has patient had a PCN reaction causing immediate rash, facial/tongue/throat swelling, SOB or lightheadedness with hypotension: Yes Has patient had a PCN reaction causing severe rash involving mucus membranes or skin necrosis: No Has patient had a PCN reaction that required hospitalization:  No Has patient had a PCN reaction occurring within the last 10 years: No If all of the above answers are "NO", then may proceed with Cephalosporin use.      Medication List    STOP taking these medications   ambrisentan 5 MG tablet Commonly known as:  LETAIRIS   losartan 50 MG tablet Commonly known as:  COZAAR     TAKE these medications   ACCU-CHEK FASTCLIX LANCET Kit USE TO CHECK BLOOD SUGAR 1-2 TIMES DAILY Dx E11.9   ACCU-CHEK NANO SMARTVIEW w/Device Kit Use to check blood sugar 1-2 times daily What changed:    how much to take  how to take this  when to take this  additional instructions   acetaminophen 500 MG tablet Commonly known as:  TYLENOL Take 2 tablets (1,000 mg total) by mouth every 6 (six) hours as needed. What changed:  reasons to take this   ALIGN 4 MG Caps Take 4 mg by mouth daily.   amLODipine 10 MG tablet Commonly known as:  NORVASC Take 1 tablet (10 mg total) by mouth daily. What changed:    medication strength  how much to take  additional instructions   atorvastatin 20 MG tablet Commonly known as:  LIPITOR TAKE 1 TABLET BY MOUTH ONCE DAILY   CENTRUM SILVER 50+WOMEN Tabs Take 1 tablet by mouth daily.   cetirizine 10 MG tablet  Commonly known as:  ZYRTEC Take 10 mg by mouth at bedtime.   COLACE 100 MG capsule Generic drug:  docusate sodium Take 100 mg by mouth at bedtime.   diclofenac sodium 1 % Gel Commonly known as:  VOLTAREN Apply 3 grams to 3 large joints, up to 3 times daily. What changed:    how much to take  how to take this  when to take this  additional instructions   DULoxetine 60 MG capsule Commonly known as:  CYMBALTA Take 60 mg by mouth every morning. What changed:  Another medication with the same name was changed. Make sure you understand how and when to take each.   DULoxetine 30 MG capsule Commonly known as:  CYMBALTA 3 tabs qd. What changed:    how much to take  how to take this  when to  take this  additional instructions   feeding supplement (ENSURE ENLIVE) Liqd Take 237 mLs by mouth 3 (three) times daily between meals. What changed:  when to take this   fluticasone 50 MCG/ACT nasal spray Commonly known as:  FLONASE Place 1-2 sprays into both nostrils at bedtime.   FLUTTER Devi 1 Device by Does not apply route as directed. What changed:  how to take this   glucose blood test strip Accu-chek nano, test glucose once daily, dx E11.9   guaiFENesin 600 MG 12 hr tablet Commonly known as:  MUCINEX Take 600 mg by mouth at bedtime.   hydroxychloroquine 200 MG tablet Commonly known as:  PLAQUENIL Take 252m by mouth twice daily, Monday-Friday only. None on Saturday or Sunday. What changed:    how much to take  how to take this  when to take this  additional instructions   ipratropium-albuterol 0.5-2.5 (3) MG/3ML Soln Commonly known as:  DUONEB INHALE CONTENTS OF 1 VIAL THREE TIMES A DAY IN NEBULIZER What changed:  See the new instructions.   lamoTRIgine 25 MG tablet Commonly known as:  LAMICTAL Take 1 tablet (25 mg total) by mouth 2 (two) times daily.   LINZESS 145 MCG Caps capsule Generic drug:  linaclotide Take 145 mcg by mouth daily as needed (constipation).   montelukast 10 MG tablet Commonly known as:  SINGULAIR TAKE 1 TABLET BY MOUTH AT BEDTIME   nitroGLYCERIN 2 % ointment Commonly known as:  NITROGLYN Apply 1/4 inch topically to affected fingertips three times daily.   NON FORMULARY Respivest: Three times a day for 30 minutes   ondansetron 4 MG disintegrating tablet Commonly known as:  ZOFRAN-ODT DISSOLVE 1 TABLET(4 MG) ON THE TONGUE EVERY 8 HOURS AS NEEDED FOR NAUSEA OR VOMITING What changed:  See the new instructions.   OVER THE COUNTER MEDICATION Take 1 capsule by mouth See admin instructions. Fdgard capsules: Take 1 capsule by mouth once a day   oxyCODONE 5 MG immediate release tablet Commonly known as:  Oxy IR/ROXICODONE Take  1-2 tablets (5-10 mg total) by mouth every 6 (six) hours as needed for up to 7 days for moderate pain.   pantoprazole 40 MG tablet Commonly known as:  PROTONIX Take 40 mg by mouth daily.   sodium chloride HYPERTONIC 3 % nebulizer solution Take by nebulization 2 (two) times daily. What changed:  how much to take   triamcinolone cream 0.1 % Commonly known as:  KENALOG Apply 1 application topically daily as needed (for itching of affected areas).   umeclidinium-vilanterol 62.5-25 MCG/INH Aepb Commonly known as:  ANORO ELLIPTA Inhale 1 puff into the lungs daily.  Follow-up Information    Dayna Barker, MD. Schedule an appointment as soon as possible for a visit in 2 week(s).   Specialty:  General Surgery Contact information: [place] [address] [phone]        Bo Merino, MD. Schedule an appointment as soon as possible for a visit in 1 week(s).   Specialty:  Rheumatology Contact information: [place] [address] [phone]        Josue Hector, MD Follow up.   Specialty:  Cardiology Contact information: [address] [address] [phone]        Nicholas Lose, MD. Go on 12/10/2018.   Specialty:  Hematology and Oncology Contact information: [place] [address] [phone]        [name], MD. Schedule an appointment as soon as possible for a visit in 1 week(s).   Specialties:  Internal Medicine, Pediatrics Contact information: [address] [place]  [address] [phone]          Allergies  Allergen Reactions  . Prednisone Shortness Of Breath, Itching, Nausea And Vomiting and Palpitations  . Solu-Medrol [Methylprednisolone] Anaphylaxis  . Amoxicillin Hives and Rash    Has patient had a PCN reaction causing immediate rash, facial/tongue/throat swelling, SOB or lightheadedness with  hypotension:Yes Has patient had a PCN reaction causing severe rash involving mucus membranes or skin necrosis: No Has patient had a PCN reaction that required hospitalization: No Has patient had a PCN reaction occurring within the last 10 years: No If all of the above answers are "NO", then may proceed with Cephalosporin use.   . Codeine Hives, Itching and Rash    Tolerated oxycodone and morphine previously  . Hydrocodone-Acetaminophen Hives, Itching and Rash    Tolerated oxycodone and morphine previously  . Penicillins Hives, Itching and Rash    ALLERGIC REACTION TO ORAL AMOXICILLIN Has patient had a PCN reaction causing immediate rash, facial/tongue/throat swelling, SOB or lightheadedness with hypotension: Yes Has patient had a PCN reaction causing severe rash involving mucus membranes or skin necrosis: No Has patient had a PCN reaction that required hospitalization: No Has patient had a PCN reaction occurring within the last 10 years: No If all of the above answers are "NO", then may proceed with Cephalosporin use.    Consultations:  Vascular surgery  Hand surgery    Procedures/Studies: Ct Angio Chest Aorta W And/or Wo Contrast  Result Date: 10/09/2018 CLINICAL DATA:  Discoloration with history of Raynaud's disease EXAM: CT ANGIOGRAPHY CHEST WITH CONTRAST TECHNIQUE: Multidetector CT imaging of the chest was performed using the standard protocol during bolus administration of intravenous contrast. Multiplanar CT image reconstructions and MIPs were obtained to evaluate the vascular anatomy. CONTRAST:  124m ISOVUE-370 IOPAMIDOL (ISOVUE-370) INJECTION 76% COMPARISON:  08/06/2018 chest x-ray, CT from 07/11/2018 FINDINGS: Cardiovascular: Thoracic aorta demonstrates a normal branching pattern. Mild atherosclerotic calcifications are seen. No aneurysmal dilatation is seen. No ulcerated plaque is identified to suggest embolic source. The visualized brachiocephalic vessels are within normal  limits. Pulmonary artery is well visualized and demonstrates no filling defect to suggest pulmonary embolism. Mediastinum/Nodes: Thoracic inlet is within normal limits. No sizable mediastinal adenopathy is noted. Symmetrical hilar lymph nodes are seen likely reactive in nature. In retrospect these are stable from a prior CT examination. The esophagus is within normal limits. Lungs/Pleura: Bronchial thickening and areas of mucous plugging are identified particularly in the lower lobes. These changes are stable  from the prior exam. Previously seen infiltrate in the right lower lobe has resolved in the interval. No sizable effusion or pneumothorax is seen. No sizable nodule is noted. Mild dependent atelectatic changes are noted. Upper Abdomen: Visualized upper abdomen is within normal limits. Musculoskeletal: Degenerative changes of the thoracic spine are seen. No acute bony abnormality is noted. Review of the MIP images confirms the above findings. IMPRESSION: No specific aortic abnormality to correspond with the given clinical history of embolic change is seen. No evidence of pulmonary emboli. Chronic bronchial thickening and mucous plugging particularly in the lower lobes with some reactive lymphadenopathy stable from the prior exam. Resolution of right lower lobe infiltrate. Aortic Atherosclerosis (ICD10-I70.0). Electronically Signed   By: Inez Catalina M.D.   On: 10/09/2018 16:31   Vas [place] Upper Extremity Arterial Duplex  Result Date: 10/10/2018 UPPER EXTREMITY DUPLEX STUDY Indications: Gangrene of bilateral fingers and upper extremities' coldness.  Limitations: Body habitus Comparison Study: No priors available Performing Technologist: Rudell Cobb  Examination Guidelines: A complete evaluation includes B-mode imaging, spectral Doppler, color Doppler, and power Doppler as needed of all accessible portions of each vessel. Bilateral testing is considered an integral part of a complete examination. Limited  examinations for reoccurring indications may be performed as noted.  Right Doppler Findings: +----------+----------+---------+------+--------+ Site      PSV (cm/s)Waveform PlaqueComments +----------+----------+---------+------+--------+ [id number]       triphasic               +----------+----------+---------+------+--------+ Brachial  107       triphasic               +----------+----------+---------+------+--------+ Radial    90        triphasic               +----------+----------+---------+------+--------+ Ulnar     112       triphasic               +----------+----------+---------+------+--------+ No obvious plaque or occlusion seen Left Doppler Findings: +--------+----------+---------+------+--------+ Site    PSV (cm/s)Waveform PlaqueComments +--------+----------+---------+------+--------+ [id number]       triphasic               +--------+----------+---------+------+--------+ Radial  110       triphasic               +--------+----------+---------+------+--------+ Ulnar   62        triphasic               +--------+----------+---------+------+--------+ No obvious plaque or occlusion seen  Summary:  Right: No obstruction visualized in the right upper extremity. Left: No obstruction visualized in the left upper extremity. *See table(s) above for measurements and observations. Electronically signed by Harold Barban MD on 10/10/2018 at 7:52:23 AM.    Final        Discharge Exam: Vitals:   10/15/18 2157 10/16/18 0536  BP: (!) 107/51 (!) 109/49  Pulse: (!) 105 88  Resp: 18 18  Temp: 99.5 F (37.5 C) 98.8 F (37.1 C)  SpO2: 100% 98%    General: Pt is alert, awake, not in acute distress Cardiovascular: RRR, S1/S2 +, no rubs, no gallops Respiratory: CTA bilaterally, no wheezing, no rhonchi Abdominal: Soft, NT, ND, bowel sounds + Extremities: no edema, no cyanosis, few areas of ulcerations on her digits, left middle finger wrapped in  dressing     The results of significant diagnostics from this hospitalization (including imaging, microbiology, ancillary and laboratory) are listed below for reference.  Microbiology: Recent Results (from the past 240 hour(s))  Surgical pcr screen     Status: None   Collection Time: 10/14/18  1:12 AM  Result Value Ref Range Status   MRSA, PCR NEGATIVE NEGATIVE Final   Staphylococcus aureus NEGATIVE NEGATIVE Final    Comment: (NOTE) The Xpert SA Assay (FDA approved for NASAL specimens in patients 28 years of age and older), is one component of a comprehensive surveillance program. It is not intended to diagnose infection nor to guide or monitor treatment. Performed at [facility], [place] [address], [place], [place] [address]      Labs: BNP (last 3 results) No results for input(s): BNP in the last 8760 hours. Basic Metabolic Panel: Recent Labs  Lab 10/10/18 0125 10/12/18 0255 10/13/18 0345 10/14/18 0237 10/15/18 0318 10/16/18 0221  NA 138 137 134* 138 133* 135  K 3.6 3.7 4.4 4.3 4.0 4.5  CL 108 108 105 103 102 100  CO2 24 22 24 23 25 28   GLUCOSE 107* 96 90 92 80 88  BUN 14 10 13 12 11 10   CREATININE 1.26* 1.13* 1.32* 1.31* 1.21* 1.29*  CALCIUM 7.9* 8.1* 8.0* 7.9* 8.1* 8.1*  MG 1.8  --   --   --   --   --   PHOS 3.4  --   --   --   --   --    Liver Function Tests: Recent Labs  Lab 10/09/18 1330 10/10/18 0125 10/12/18 0255  AST 24 21 20   ALT 15 12 13   ALKPHOS 92 89 79  BILITOT 0.7 0.4 0.5  PROT 7.1 6.1* 6.1*  ALBUMIN 2.2* 1.9* 1.8*   No results for input(s): LIPASE, AMYLASE in the last 168 hours. No results for input(s): AMMONIA in the last 168 hours. CBC: Recent Labs  Lab 10/12/18 0255 10/13/18 0345 10/14/18 0237 10/15/18 0318 10/16/18 0221  WBC 26.7* 27.5* 26.3* 27.5* 27.0*  NEUTROABS  --   --  3.4 3.9 3.8  HGB 8.9* 8.6* 8.8* 8.5* 8.1*  HCT 27.4* 26.3* 27.6* 26.2* 25.4*  MCV 90.4 90.7 90.2 90.0 90.1  PLT 383 402* 397 444* 459*    Cardiac Enzymes: No results for input(s): CKTOTAL, CKMB, CKMBINDEX, TROPONINI in the last 168 hours. BNP: Invalid input(s): POCBNP CBG: Recent Labs  Lab 10/15/18 1127 10/15/18 1434 10/15/18 1652 10/15/18 2158 10/16/18 0645  GLUCAP 64* 92 76 110* 85   D-Dimer No results for input(s): DDIMER in the last 72 hours. Hgb A1c No results for input(s): HGBA1C in the last 72 hours. Lipid Profile No results for input(s): CHOL, HDL, LDLCALC, TRIG, CHOLHDL, LDLDIRECT in the last 72 hours. Thyroid function studies No results for input(s): TSH, T4TOTAL, T3FREE, THYROIDAB in the last 72 hours.  Invalid input(s): FREET3 Anemia work up No results for input(s): VITAMINB12, FOLATE, FERRITIN, TIBC, IRON, RETICCTPCT in the last 72 hours. Urinalysis    Component Value Date/Time   COLORURINE YELLOW 08/06/2018 1932   APPEARANCEUR CLEAR 08/06/2018 1932   LABSPEC >1.046 (H) 08/06/2018 1932   PHURINE 6.0 08/06/2018 1932   GLUCOSEU NEGATIVE 08/06/2018 [place] NEGATIVE 08/06/2018 1932   HGBUR negative 03/11/2008 [place] 08/06/2018 1932   BILIRUBINUR n 08/07/2017 1558   KETONESUR 5 (A) 08/06/2018 1932   PROTEINUR 100 (A) 08/06/2018 1932   UROBILINOGEN 1.0 08/07/2017 1558   UROBILINOGEN 0.2 08/16/2014 1513   NITRITE NEGATIVE 08/06/2018 1932   LEUKOCYTESUR NEGATIVE 08/06/2018 1932   Sepsis Labs Invalid input(s):  PROCALCITONIN,  WBC,  LACTICIDVEN Microbiology Recent Results (from the past 240 hour(s))  Surgical pcr screen     Status: None   Collection Time: 10/14/18  1:12 AM  Result Value Ref Range Status   MRSA, PCR NEGATIVE NEGATIVE Final   Staphylococcus aureus NEGATIVE NEGATIVE Final    Comment: (NOTE) The Xpert SA Assay (FDA approved for NASAL specimens in patients 5 years of age and older), is one component of a comprehensive surveillance program. It is not intended to diagnose infection nor to guide or monitor treatment. Performed at [facility], [place] [address] [place], [place] [address]      Patient was seen and examined on the day of discharge and was found to be in stable condition. Time coordinating discharge: 25 minutes including assessment and coordination of care, as well as examination of the patient.   SIGNED:  Dessa Phi, DO [facility] Pager [phone]  If 7PM-7AM, please contact night-coverage [email or web address] Password [facility] 10/16/2018, 8:38 AM

## 2018-10-16 NOTE — Progress Notes (Signed)
Pt denies pain, dressing to left middle finger dry and intact. Ambulating independently in the room. Discharge instructions given to pt, discharged to home accompanied by a friend.

## 2018-10-17 ENCOUNTER — Other Ambulatory Visit: Payer: Self-pay | Admitting: *Deleted

## 2018-10-17 ENCOUNTER — Telehealth: Payer: Self-pay

## 2018-10-17 NOTE — Telephone Encounter (Signed)
Unable to reach patient at time of TCM Call.  Patients Voice mail is full, unable to leave a massage. CRM created.

## 2018-10-17 NOTE — Patient Outreach (Signed)
[facility]) Care Management  10/17/2018  Desiree [name] February 07, 1951 [id number]   Noted member was admitted to hospital 12/18-12/25 with Raynaud's disease requiring amputation of the finger.  Primary MD office will complete transition of care assessment.  Call placed to member to follow up on discharge, no answer.  Unable to leave voice message as mailbox is full.  Will follow up within the next 4 business days.  Kemper Durie, [place], MSN [facility] Care Management  [facility] Manager [phone]

## 2018-10-18 ENCOUNTER — Other Ambulatory Visit: Payer: Self-pay | Admitting: Rheumatology

## 2018-10-18 ENCOUNTER — Ambulatory Visit: Payer: Self-pay | Admitting: Rheumatology

## 2018-10-18 NOTE — Telephone Encounter (Signed)
LM requesting call back to complete TCM and confirm hospital follow up.   
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
## 2018-10-18 NOTE — Telephone Encounter (Signed)
Last Visit: 10/08/18 Next visit: 02/03/19  Okay to refill per Dr. Corliss Skains
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
## 2018-10-20 ENCOUNTER — Encounter (HOSPITAL_COMMUNITY): Payer: Self-pay | Admitting: Emergency Medicine

## 2018-10-20 ENCOUNTER — Other Ambulatory Visit: Payer: Self-pay

## 2018-10-20 ENCOUNTER — Emergency Department (HOSPITAL_COMMUNITY)
Admission: EM | Admit: 2018-10-20 | Discharge: 2018-10-20 | Disposition: A | Discharge status: Home / Self Care | Payer: Medicare HMO | Attending: Emergency Medicine | Admitting: Emergency Medicine

## 2018-10-20 DIAGNOSIS — I129 Hypertensive chronic kidney disease with stage 1 through stage 4 chronic kidney disease, or unspecified chronic kidney disease: Secondary | ICD-10-CM | POA: Insufficient documentation

## 2018-10-20 DIAGNOSIS — Z87891 Personal history of nicotine dependence: Secondary | ICD-10-CM | POA: Diagnosis not present

## 2018-10-20 DIAGNOSIS — Z4800 Encounter for change or removal of nonsurgical wound dressing: Secondary | ICD-10-CM | POA: Diagnosis not present

## 2018-10-20 DIAGNOSIS — Z79899 Other long term (current) drug therapy: Secondary | ICD-10-CM | POA: Diagnosis not present

## 2018-10-20 DIAGNOSIS — E119 Type 2 diabetes mellitus without complications: Secondary | ICD-10-CM | POA: Insufficient documentation

## 2018-10-20 DIAGNOSIS — M79645 Pain in left finger(s): Secondary | ICD-10-CM | POA: Diagnosis not present

## 2018-10-20 DIAGNOSIS — N183 Chronic kidney disease, stage 3 (moderate): Secondary | ICD-10-CM | POA: Diagnosis not present

## 2018-10-20 DIAGNOSIS — Z4801 Encounter for change or removal of surgical wound dressing: Secondary | ICD-10-CM

## 2018-10-20 DIAGNOSIS — J449 Chronic obstructive pulmonary disease, unspecified: Secondary | ICD-10-CM | POA: Insufficient documentation

## 2018-10-20 MED ORDER — HYDROCODONE-ACETAMINOPHEN 5-325 MG PO TABS
1.0000 | ORAL_TABLET | Freq: Once | ORAL | Status: AC
  Administered 2018-10-20: 1 via ORAL
  Filled 2018-10-20: qty 1

## 2018-10-20 NOTE — ED Provider Notes (Signed)
[facility] EMERGENCY DEPARTMENT Provider Note   CSN: [id number] Arrival date & time: 10/20/18  1448     History   Chief Complaint No chief complaint on file.   HPI Desiree [name] is a 67 y.o. female.  The history is provided by the patient and medical records. No language interpreter was used.     67 year old female with recent left long finger ischemia requiring amputation by Dr. Lenon Curt on 12/23 presenting today for request of dressing changes.  Patient states today she was taking a bath and even though she wrapped her finger with plastic she has gotten it wet.  She went to urgent care for dressing change however it was closed so patient sent to the ED to get her dressing change.  She does complain of throbbing pain to her finger which is not worsening but since she has been had a chance to take a regular medication she is requesting for pain medication.  She denies any fever.  No other complaint.  Past Medical History:  Diagnosis Date  . Anxiety   . Bipolar disorder ([place])   . CANDIDIASIS, ESOPHAGEAL 07/28/2009   Qualifier: Diagnosis of  By: [name] MD, [name]   . Chronic kidney disease    CKD III  . COPD (chronic obstructive pulmonary disease) ([place])   . Depression   . Diabetes mellitus without complication (HCC)    no meds  . FH: colonic polyps   . Fractured elbow    right   . GERD (gastroesophageal reflux disease)   . HH (hiatus hernia)   . History of carpal tunnel syndrome   . History of chest pain   . History of transfusion of packed red blood cells   . Hyperlipidemia   . Hypertension   . Neuroleptic-induced tardive dyskinesia   . Osteoarthritis of more than one site   . Seasonal allergies     Patient Active Problem List   Diagnosis Date Noted  . Gangrene of finger ([place]) 10/09/2018  . Raynaud's phenomenon with gangrene ([place]) 10/09/2018  . LVH (left ventricular hypertrophy) 10/09/2018  . Vasospasm ([place]) 09/06/2018  . Hypotension 08/08/2018    . Leucocytosis 08/08/2018  . Acute gastritis without bleeding 08/08/2018  . Elevated troponin I level 08/08/2018  . Nausea and vomiting 08/06/2018  . Pneumonia 07/12/2018  . COPD exacerbation ([place]) 07/11/2018  . Primary osteoarthritis of both feet 12/21/2017  . DDD (degenerative disc disease), lumbar 12/21/2017  . Primary osteoarthritis of both knees 12/21/2017  . History of bilateral carpal tunnel release 12/21/2017  . DDD (degenerative disc disease), cervical 11/15/2017  . Former smoker 11/15/2017  . Bronchiectasis without complication ([place]) 18/56/3149  . Impingement syndrome of right shoulder 07/25/2017  . Hx of fusion of cervical spine 07/25/2017  . Cervical spinal stenosis 09/27/2016  . Closed displaced fracture of proximal phalanx of lesser toe of right foot 09/08/2016  . Spinal stenosis of cervical region 09/08/2016  . Polypharmacy 06/23/2015  . Hyperlipidemia 04/19/2015  . Visit for preventive health examination 01/01/2015  . Primary osteoarthritis involving multiple joints 01/01/2015  . Bipolar affective disorder, currently depressed, moderate ([place])   . Bipolar I disorder with mania ([place]) 08/17/2014  . Foot cramps 07/03/2014  . Hypertension 10/21/2013  . Ear fullness 10/21/2013  . Dizziness 03/25/2013  . Medication withdrawal ([place]) 03/05/2013  . Diabetes mellitus with renal manifestations, controlled ([place]) 11/10/2012  . Alopecia areata 02/06/2012  . Obesity (BMI 30-39.9) 02/06/2012  . Renal insufficiency 07/16/2011  . Neuroleptic-induced  tardive dyskinesia   . Exertional dyspnea 02/07/2011  . Dyspnea on exertion 01/19/2011  . DM (diabetes mellitus), type 2 ([place]) 04/22/2010  . Carpal tunnel syndrome 02/02/2010  . CONSTIPATION 02/02/2010  . Primary osteoarthritis of both hands 02/02/2010  . CATARACTS 04/13/2009  . PAIN IN JOINT, ANKLE AND FOOT 09/16/2008  . Eosinophilia 07/21/2008  . AFFECTIVE DISORDER 04/21/2008  . BACK PAIN, CHRONIC 02/12/2008  . LEG PAIN  02/12/2008  . ABNORMAL INVOLUNTARY MOVEMENTS 12/04/2007  . POSTURAL LIGHTHEADEDNESS 11/04/2007  . COLONIC POLYPS, HX OF 11/04/2007  . Essential hypertension 06/17/2007  . HYPERLIPIDEMIA 04/08/2007  . MITRAL VALVE PROLAPSE 04/08/2007  . GERD 04/08/2007  . LOW BACK PAIN SYNDROME 04/08/2007  . CHEST PAIN, RECURRENT 04/08/2007    Past Surgical History:  Procedure Laterality Date  . AMPUTATION Left 10/14/2018   Procedure: AMPUTATION LEFT LONG FINGER TIP;  Surgeon: Dayna Barker, MD;  Location: [facility];  Service: Plastics;  Laterality: Left;  . ANTERIOR CERVICAL DECOMP/DISCECTOMY FUSION  09/27/2016   C5-6 anterior cervical discectomy and fusion, allograft and plate/notes 09/27/2016  . ANTERIOR CERVICAL DECOMP/DISCECTOMY FUSION N/A 09/27/2016   Procedure: C5-6 Anterior Cervical Discectomy and Fusion, Allograft and Plate;  Surgeon: [name], MD;  Location: [facility];  Service: Orthopedics;  Laterality: N/A;  . Back Fusion  2002  . BIOPSY  07/19/2018   Procedure: BIOPSY;  Surgeon: [name], MD;  Location: [facility];  Service: Endoscopy;;  . CARPAL TUNNEL RELEASE  yates   left  . COLONOSCOPY N/A 01/05/2014   Procedure: COLONOSCOPY;  Surgeon: [name], MD;  Location: [facility];  Service: Endoscopy;  Laterality: N/A;  . ELBOW SURGERY     age 6  . ESOPHAGOGASTRODUODENOSCOPY (EGD) WITH PROPOFOL N/A 07/19/2018   Procedure: ESOPHAGOGASTRODUODENOSCOPY (EGD) WITH PROPOFOL;  Surgeon: [name], MD;  Location: [facility];  Service: Endoscopy;  Laterality: N/A;  . EXTERNAL EAR SURGERY Left   . EYE SURGERY     "removed white dots under eyelid"  . FINGER SURGERY Left   . Juvara osteomy    . KNEE SURGERY    . NOSE SURGERY    . Rt. toe bunion    . skin, shave biopsy  05/03/2016   Left occipital scalp, top of scalp  . UPPER EXTREMITY ANGIOGRAPHY Bilateral 10/11/2018   Procedure: UPPER EXTREMITY ANGIOGRAPHY;  Surgeon: [name], MD;  Location: [facility];  Service:  Cardiovascular;  Laterality: Bilateral;     OB History   No obstetric history on file.      Home Medications    Prior to Admission medications   Medication Sig Start Date End Date Taking? Authorizing Provider  acetaminophen (TYLENOL) 500 MG tablet Take 2 tablets (1,000 mg total) by mouth every 6 (six) hours as needed. Patient taking differently: Take 1,000 mg by mouth every 6 (six) hours as needed for mild pain or headache.  09/13/15   [name], MD  amLODipine (NORVASC) 10 MG tablet Take 1 tablet (10 mg total) by mouth daily. 10/16/18 11/15/18  Dessa Phi, DO  atorvastatin (LIPITOR) 20 MG tablet TAKE 1 TABLET BY MOUTH ONCE DAILY Patient taking differently: Take 20 mg by mouth daily.  06/27/18   [name], [name], MD  Blood Glucose Monitoring Suppl (ACCU-CHEK NANO SMARTVIEW) W/DEVICE KIT Use to check blood sugar 1-2 times daily Patient taking differently: 1 strip by Other route 2 (two) times a week.  11/26/14   [name], [name], MD  cetirizine (ZYRTEC) 10 MG tablet Take 10  mg by mouth at bedtime.     [provider]  diclofenac sodium (VOLTAREN) 1 % GEL Apply 3 grams to 3 large joints, up to 3 times daily. Patient taking differently: Apply 3 g topically See admin instructions. Apply 3 grams to three large joints, up to three times a day 01/29/18   Bo Merino, MD  docusate sodium (COLACE) 100 MG capsule Take 100 mg by mouth at bedtime.     [provider]  DULoxetine (CYMBALTA) 30 MG capsule 3 tabs qd. Patient taking differently: Take 30 mg by mouth every morning.  09/09/18   Shugart, Lissa Hoard, PA-C  DULoxetine (CYMBALTA) 60 MG capsule Take 60 mg by mouth every morning.     [provider]  feeding supplement, ENSURE ENLIVE, (ENSURE ENLIVE) LIQD Take 237 mLs by mouth 3 (three) times daily between meals. Patient taking differently: Take 237 mLs by mouth 2 (two) times daily after a meal.  07/20/18   [name], MD  fluticasone (FLONASE) 50 MCG/ACT nasal  spray Place 1-2 sprays into both nostrils at bedtime.     [provider]  glucose blood test strip Accu-chek nano, test glucose once daily, dx E11.9 09/06/18   [name], [name], MD  guaiFENesin (MUCINEX) 600 MG 12 hr tablet Take 600 mg by mouth at bedtime.    [provider]  hydroxychloroquine (PLAQUENIL) 200 MG tablet Take 1 tablet (200 mg total) by mouth See admin instructions. Take 200 mg my mouth two times a day on Mon/Tues/Wed/Thurs/Fri, and nothing on Sat or Sun 10/16/18   Dessa Phi, DO  ipratropium-albuterol (DUONEB) 0.5-2.5 (3) MG/3ML SOLN INHALE CONTENTS OF 1 VIAL THREE TIMES A DAY IN NEBULIZER Patient taking differently: Take 3 mLs by nebulization 3 (three) times daily.  05/06/18   [name], MD  lamoTRIgine (LAMICTAL) 25 MG tablet Take 1 tablet (25 mg total) by mouth 2 (two) times daily. 09/09/18   Shugart, Lissa Hoard, PA-C  Lancets Misc. (ACCU-CHEK FASTCLIX LANCET) KIT USE TO CHECK BLOOD SUGAR 1-2 TIMES DAILY Dx E11.9 09/06/18   [name], [name], MD  linaclotide (LINZESS) 145 MCG CAPS capsule Take 145 mcg by mouth daily as needed (constipation).    [provider]  montelukast (SINGULAIR) 10 MG tablet TAKE 1 TABLET BY MOUTH AT BEDTIME Patient taking differently: Take 10 mg by mouth at bedtime.  08/19/18   [name], MD  Multiple Vitamins-Minerals (CENTRUM SILVER 50+WOMEN) TABS Take 1 tablet by mouth daily.    [provider]  nitroGLYCERIN (NITRO-BID) 2 % ointment APPLY 1/4 INCH TOPICALLY TO AFFECTED FINGERTIPS THREE TIMES DAILY 10/18/18   Bo Merino, MD  NON FORMULARY Respivest: Three times a day for 30 minutes    [provider]  ondansetron (ZOFRAN-ODT) 4 MG disintegrating tablet DISSOLVE 1 TABLET(4 MG) ON THE TONGUE EVERY 8 HOURS AS NEEDED FOR NAUSEA OR VOMITING Patient taking differently: Take 4 mg by mouth every 8 (eight) hours as needed for nausea or vomiting.  08/13/18   [name], [name], MD  OVER THE COUNTER  MEDICATION Take 1 capsule by mouth See admin instructions. Fdgard capsules: Take 1 capsule by mouth once a day    [provider]  oxyCODONE (OXY IR/ROXICODONE) 5 MG immediate release tablet Take 1-2 tablets (5-10 mg total) by mouth every 6 (six) hours as needed for up to 7 days for moderate pain. 10/16/18 10/23/18  Dessa Phi, DO  pantoprazole (PROTONIX) 40 MG tablet Take 40 mg by mouth daily.  [provider]  Probiotic Product (ALIGN) 4 MG CAPS Take 4 mg by mouth daily.    [provider]  Respiratory Therapy Supplies (FLUTTER) DEVI 1 Device by Does not apply route as directed. Patient taking differently: 1 Device as directed.  10/24/17   Parrett, Fonnie Mu, NP  sodium chloride HYPERTONIC 3 % nebulizer solution Take by nebulization 2 (two) times daily. Patient taking differently: Take 4 mLs by nebulization 2 (two) times daily.  01/18/18   [name], MD  triamcinolone cream (KENALOG) 0.1 % Apply 1 application topically daily as needed (for itching of affected areas).  09/26/18   [provider]  umeclidinium-vilanterol (ANORO ELLIPTA) 62.5-25 MCG/INH AEPB Inhale 1 puff into the lungs daily. 07/26/18   [name], MD    Family History Family History  Problem Relation Age of Onset  . Heart attack Father   . Heart disease Father   . Throat cancer Brother   . Diabetes Mother   . Hypertension Mother   . Heart attack Mother   . Diabetes type II Brother   . Heart disease Brother   . Lung cancer Brother   . Breast cancer Cousin   . Lung cancer Daughter   . Lung cancer Paternal Uncle     Social History Social History   Tobacco Use  . Smoking status: Former Smoker    Packs/day: 2.00    Years: 30.00    Pack years: 60.00    Types: Cigarettes    Last attempt to quit: 10/23/2001    Years since quitting: 17.0  . Smokeless tobacco: Never Used  Substance Use Topics  . Alcohol use: No  . Drug use: No     Allergies   Prednisone;  Solu-medrol [methylprednisolone]; Amoxicillin; Codeine; Hydrocodone-acetaminophen; and Penicillins   Review of Systems Review of Systems  Constitutional: Negative for fever.  Skin: Positive for wound.     Physical Exam Updated Vital Signs BP 138/69 (BP Location: Right Arm)   Pulse 96   Temp 98.6 F (37 C) (Oral)   Resp 18   Ht 5' 3"  (1.6 m)   Wt 58.1 kg   SpO2 100%   BMI 22.67 kg/m   Physical Exam Vitals signs and nursing note reviewed.  Constitutional:      General: She is not in acute distress.    Appearance: She is well-developed.  HENT:     Head: Atraumatic.  Eyes:     Conjunctiva/sclera: Conjunctivae normal.  Neck:     Musculoskeletal: Neck supple.  Skin:    Findings: No rash.     Comments: Left middle finger: Finger was wrapped in dressing that was wet.  Dressing removed, fingertip covered in Xeroform, no evidence of infection.  Neurological:     Mental Status: She is alert.      ED Treatments / Results  Labs (all labs ordered are listed, but only abnormal results are displayed) Labs Reviewed - No data to display  EKG None  Radiology No results found.  Procedures Procedures (including critical care time)  Medications Ordered in ED Medications - No data to display   Initial Impression / Assessment and Plan / ED Course  I have reviewed the triage vital signs and the nursing notes.  Pertinent labs & imaging results that were available during my care of the patient were reviewed by me and considered in my medical decision making (see chart for details).     BP 138/69 (BP Location: Right Arm)  Pulse 96   Temp 98.6 F (37 C) (Oral)   Resp 18   Ht 5' 3"  (1.6 m)   Wt 58.1 kg   SpO2 100%   BMI 22.67 kg/m    Final Clinical Impressions(s) / ED Diagnoses   Final diagnoses:  Dressing change or removal, surgical wound    ED Discharge Orders    None     8:50 PM Patient accidentally got her dressing wet and requesting for dressing  change.  Dressing change made to her left middle finger which was recently amputated due to ischemia secondary to diabetes.  No evidence of infection.  Patient tolerates well, pain medication given per her request.  Patient will follow-up with her hand specialist as previously scheduled.   Domenic Moras, PA-C 10/20/18 2051    Quintella Reichert, MD 10/21/18 [phone]

## 2018-10-20 NOTE — ED Notes (Signed)
Pt wants something for pain.

## 2018-10-20 NOTE — ED Triage Notes (Signed)
Per pt:  Needs bandage changed on left hand middle finger.  Pt had recent surgery on said finger 10/13/18.

## 2018-10-21 ENCOUNTER — Telehealth: Payer: Self-pay | Admitting: *Deleted

## 2018-10-21 ENCOUNTER — Ambulatory Visit (INDEPENDENT_AMBULATORY_CARE_PROVIDER_SITE_OTHER): Payer: Medicare HMO | Admitting: Physician Assistant

## 2018-10-21 ENCOUNTER — Encounter: Payer: Self-pay | Admitting: Physician Assistant

## 2018-10-21 ENCOUNTER — Other Ambulatory Visit: Payer: Self-pay | Admitting: *Deleted

## 2018-10-21 VITALS — BP 113/76 | HR 94 | Resp 12 | Ht 62.5 in | Wt 126.4 lb

## 2018-10-21 DIAGNOSIS — J479 Bronchiectasis, uncomplicated: Secondary | ICD-10-CM

## 2018-10-21 DIAGNOSIS — I7301 Raynaud's syndrome with gangrene: Secondary | ICD-10-CM | POA: Diagnosis not present

## 2018-10-21 DIAGNOSIS — M5136 Other intervertebral disc degeneration, lumbar region: Secondary | ICD-10-CM

## 2018-10-21 DIAGNOSIS — L639 Alopecia areata, unspecified: Secondary | ICD-10-CM

## 2018-10-21 DIAGNOSIS — Z9889 Other specified postprocedural states: Secondary | ICD-10-CM

## 2018-10-21 DIAGNOSIS — Z79899 Other long term (current) drug therapy: Secondary | ICD-10-CM

## 2018-10-21 DIAGNOSIS — M170 Bilateral primary osteoarthritis of knee: Secondary | ICD-10-CM

## 2018-10-21 DIAGNOSIS — M19042 Primary osteoarthritis, left hand: Secondary | ICD-10-CM

## 2018-10-21 DIAGNOSIS — T43505A Adverse effect of unspecified antipsychotics and neuroleptics, initial encounter: Secondary | ICD-10-CM

## 2018-10-21 DIAGNOSIS — M7541 Impingement syndrome of right shoulder: Secondary | ICD-10-CM | POA: Diagnosis not present

## 2018-10-21 DIAGNOSIS — M19041 Primary osteoarthritis, right hand: Secondary | ICD-10-CM

## 2018-10-21 DIAGNOSIS — M19071 Primary osteoarthritis, right ankle and foot: Secondary | ICD-10-CM

## 2018-10-21 DIAGNOSIS — D721 Eosinophilia, unspecified: Secondary | ICD-10-CM

## 2018-10-21 DIAGNOSIS — E782 Mixed hyperlipidemia: Secondary | ICD-10-CM

## 2018-10-21 DIAGNOSIS — Z8639 Personal history of other endocrine, nutritional and metabolic disease: Secondary | ICD-10-CM

## 2018-10-21 DIAGNOSIS — R768 Other specified abnormal immunological findings in serum: Secondary | ICD-10-CM | POA: Diagnosis not present

## 2018-10-21 DIAGNOSIS — Z8659 Personal history of other mental and behavioral disorders: Secondary | ICD-10-CM

## 2018-10-21 DIAGNOSIS — M5030 Other cervical disc degeneration, unspecified cervical region: Secondary | ICD-10-CM | POA: Diagnosis not present

## 2018-10-21 DIAGNOSIS — Z87891 Personal history of nicotine dependence: Secondary | ICD-10-CM

## 2018-10-21 DIAGNOSIS — I10 Essential (primary) hypertension: Secondary | ICD-10-CM

## 2018-10-21 DIAGNOSIS — G2401 Drug induced subacute dyskinesia: Secondary | ICD-10-CM

## 2018-10-21 DIAGNOSIS — M19072 Primary osteoarthritis, left ankle and foot: Secondary | ICD-10-CM

## 2018-10-21 NOTE — Telephone Encounter (Signed)
Prior Authorization submitted via cover my meds for Ambrisentan. Will update once response received.

## 2018-10-21 NOTE — Patient Instructions (Addendum)
Please use the following medications on a regular basis for Raynaud's (hand issue): *Plaquenil 1 tablet (200 mg) twice a day Monday through Friday with food *amlodipine 1 tablet daily (10 mg) *nitroglycerin 2 % ointment 1/4 inch on affected finger three times a day *aspirin 81 mg daily  Also apply affected fingers covered with gauze and paper tape.  Keep hands and feet warm and covered to prevent ulcers.  Standing Labs We placed an order today for your standing lab work.    Please come back and get your standing labs in 1 month, then 3 months, then every 5 months.  We have open lab Monday through Friday from 8:30-11:30 AM and 1:30-4:00 PM  at the office of Dr. [name].   You may experience shorter wait times on Monday and Friday afternoons. The office is located at [address], [place], [place] [address] No appointment is necessary.   Labs are drawn by [facility].  You may receive a bill from [facility] for your lab work.  If you wish to have your labs drawn at another location, please call the office 24 hours in advance to send orders.  If you have any questions regarding directions or hours of operation,  please call [phone].   Just as a reminder please drink plenty of water prior to coming for your lab work. Thanks!

## 2018-10-21 NOTE — Patient Outreach (Signed)
[facility]) Care Management  10/21/2018  Desiree [name] 1951/02/20 [id number]   Voice message received from member returning this care manager's call from last week.  Call placed to member today to follow up on discharge.  She state she does not have time to talk as she is on her way to an MD appointment.  She will call this care manager back when she is available.  If no call back, will follow up within the next 4 business days.  Kemper Durie, [place], MSN [facility] Care Management  [facility] Manager [phone]

## 2018-10-22 ENCOUNTER — Emergency Department (HOSPITAL_COMMUNITY)
Admission: EM | Admit: 2018-10-22 | Discharge: 2018-10-22 | Disposition: A | Discharge status: Home / Self Care | Payer: Medicare HMO | Attending: Emergency Medicine | Admitting: Emergency Medicine

## 2018-10-22 ENCOUNTER — Ambulatory Visit (INDEPENDENT_AMBULATORY_CARE_PROVIDER_SITE_OTHER): Payer: Medicare HMO | Admitting: Internal Medicine

## 2018-10-22 ENCOUNTER — Ambulatory Visit: Payer: Self-pay | Admitting: *Deleted

## 2018-10-22 ENCOUNTER — Ambulatory Visit: Payer: Medicare HMO | Admitting: Cardiovascular Disease

## 2018-10-22 ENCOUNTER — Encounter: Payer: Self-pay | Admitting: Internal Medicine

## 2018-10-22 ENCOUNTER — Emergency Department (HOSPITAL_COMMUNITY): Payer: Medicare HMO

## 2018-10-22 ENCOUNTER — Encounter (HOSPITAL_COMMUNITY): Payer: Self-pay | Admitting: Emergency Medicine

## 2018-10-22 ENCOUNTER — Other Ambulatory Visit: Payer: Self-pay

## 2018-10-22 VITALS — BP 116/68 | Temp 98.3°F | Wt 122.7 lb

## 2018-10-22 DIAGNOSIS — N183 Chronic kidney disease, stage 3 (moderate): Secondary | ICD-10-CM | POA: Insufficient documentation

## 2018-10-22 DIAGNOSIS — I7301 Raynaud's syndrome with gangrene: Secondary | ICD-10-CM

## 2018-10-22 DIAGNOSIS — Z89022 Acquired absence of left finger(s): Secondary | ICD-10-CM | POA: Diagnosis not present

## 2018-10-22 DIAGNOSIS — J449 Chronic obstructive pulmonary disease, unspecified: Secondary | ICD-10-CM | POA: Diagnosis not present

## 2018-10-22 DIAGNOSIS — W19XXXA Unspecified fall, initial encounter: Secondary | ICD-10-CM | POA: Diagnosis not present

## 2018-10-22 DIAGNOSIS — M545 Low back pain, unspecified: Secondary | ICD-10-CM

## 2018-10-22 DIAGNOSIS — W101XXA Fall (on)(from) sidewalk curb, initial encounter: Secondary | ICD-10-CM | POA: Insufficient documentation

## 2018-10-22 DIAGNOSIS — R1110 Vomiting, unspecified: Secondary | ICD-10-CM | POA: Diagnosis not present

## 2018-10-22 DIAGNOSIS — Z09 Encounter for follow-up examination after completed treatment for conditions other than malignant neoplasm: Secondary | ICD-10-CM

## 2018-10-22 DIAGNOSIS — R112 Nausea with vomiting, unspecified: Secondary | ICD-10-CM | POA: Diagnosis not present

## 2018-10-22 DIAGNOSIS — M5489 Other dorsalgia: Secondary | ICD-10-CM | POA: Diagnosis not present

## 2018-10-22 DIAGNOSIS — R197 Diarrhea, unspecified: Secondary | ICD-10-CM | POA: Insufficient documentation

## 2018-10-22 DIAGNOSIS — I129 Hypertensive chronic kidney disease with stage 1 through stage 4 chronic kidney disease, or unspecified chronic kidney disease: Secondary | ICD-10-CM | POA: Diagnosis not present

## 2018-10-22 DIAGNOSIS — Z87891 Personal history of nicotine dependence: Secondary | ICD-10-CM | POA: Insufficient documentation

## 2018-10-22 DIAGNOSIS — Z79899 Other long term (current) drug therapy: Secondary | ICD-10-CM | POA: Diagnosis not present

## 2018-10-22 DIAGNOSIS — N289 Disorder of kidney and ureter, unspecified: Secondary | ICD-10-CM | POA: Diagnosis not present

## 2018-10-22 DIAGNOSIS — D721 Eosinophilia, unspecified: Secondary | ICD-10-CM

## 2018-10-22 DIAGNOSIS — D649 Anemia, unspecified: Secondary | ICD-10-CM

## 2018-10-22 DIAGNOSIS — R52 Pain, unspecified: Secondary | ICD-10-CM | POA: Diagnosis not present

## 2018-10-22 DIAGNOSIS — E1122 Type 2 diabetes mellitus with diabetic chronic kidney disease: Secondary | ICD-10-CM | POA: Insufficient documentation

## 2018-10-22 LAB — CBC WITH DIFFERENTIAL/PLATELET
Abs Immature Granulocytes: 0.1 10*3/uL — ABNORMAL HIGH (ref 0.00–0.07)
Basophils Absolute: 0.2 10*3/uL — ABNORMAL HIGH (ref 0.0–0.1)
Basophils Relative: 1 %
Eosinophils Absolute: 8.2 10*3/uL — ABNORMAL HIGH (ref 0.0–0.5)
Eosinophils Relative: 40 %
HCT: 33.9 % — ABNORMAL LOW (ref 36.0–46.0)
Hemoglobin: 10.5 g/dL — ABNORMAL LOW (ref 12.0–15.0)
Immature Granulocytes: 1 %
Lymphocytes Relative: 16 %
Lymphs Abs: 3.2 10*3/uL (ref 0.7–4.0)
MCH: 29.4 pg (ref 26.0–34.0)
MCHC: 31 g/dL (ref 30.0–36.0)
MCV: 95 fL (ref 80.0–100.0)
Monocytes Absolute: 0.6 10*3/uL (ref 0.1–1.0)
Monocytes Relative: 3 %
Neutro Abs: 7.9 10*3/uL — ABNORMAL HIGH (ref 1.7–7.7)
Neutrophils Relative %: 39 %
Platelets: 488 10*3/uL — ABNORMAL HIGH (ref 150–400)
RBC: 3.57 MIL/uL — ABNORMAL LOW (ref 3.87–5.11)
RDW: 17.7 % — ABNORMAL HIGH (ref 11.5–15.5)
WBC: 20.1 10*3/uL — ABNORMAL HIGH (ref 4.0–10.5)
nRBC: 0 % (ref 0.0–0.2)

## 2018-10-22 LAB — COMPREHENSIVE METABOLIC PANEL
ALT: 20 U/L (ref 0–44)
AST: 38 U/L (ref 15–41)
Albumin: 2.5 g/dL — ABNORMAL LOW (ref 3.5–5.0)
Alkaline Phosphatase: 107 U/L (ref 38–126)
Anion gap: 9 (ref 5–15)
BUN: 19 mg/dL (ref 8–23)
CO2: 25 mmol/L (ref 22–32)
Calcium: 8.6 mg/dL — ABNORMAL LOW (ref 8.9–10.3)
Chloride: 105 mmol/L (ref 98–111)
Creatinine, Ser: 1.3 mg/dL — ABNORMAL HIGH (ref 0.44–1.00)
GFR calc Af Amer: 50 mL/min — ABNORMAL LOW (ref >60)
GFR calc non Af Amer: 43 mL/min — ABNORMAL LOW (ref >60)
Glucose, Bld: 85 mg/dL (ref 70–99)
Potassium: 4.2 mmol/L (ref 3.5–5.1)
Sodium: 139 mmol/L (ref 135–145)
Total Bilirubin: 0.5 mg/dL (ref 0.3–1.2)
Total Protein: 8.4 g/dL — ABNORMAL HIGH (ref 6.5–8.1)

## 2018-10-22 LAB — URINALYSIS, ROUTINE W REFLEX MICROSCOPIC
Bilirubin Urine: NEGATIVE
Glucose, UA: NEGATIVE mg/dL
Hgb urine dipstick: NEGATIVE
Ketones, ur: NEGATIVE mg/dL
Nitrite: NEGATIVE
Protein, ur: 100 mg/dL — AB
Specific Gravity, Urine: 1.023 (ref 1.005–1.030)
pH: 6 (ref 5.0–8.0)

## 2018-10-22 LAB — LIPASE, BLOOD: Lipase: 45 U/L (ref 11–51)

## 2018-10-22 MED ORDER — ONDANSETRON 8 MG PO TBDP
8.0000 mg | ORAL_TABLET | Freq: Once | ORAL | Status: AC
  Administered 2018-10-22: 8 mg via ORAL
  Filled 2018-10-22: qty 1

## 2018-10-22 MED ORDER — ONDANSETRON 4 MG PO TBDP: 4.0000 mg | ORAL_TABLET | Freq: Three times a day (TID) | ORAL | 0 refills | Status: DC | PRN

## 2018-10-22 NOTE — ED Provider Notes (Signed)
[facility] DEPT Provider Note   CSN: [id number] Arrival date & time: 10/22/18  1351     History   Chief Complaint Chief Complaint  Patient presents with  . Fall  . Back Pain    HPI Desiree [name] is a 67 y.o. female with a PMHx of eosinophilia, anxiety, bipolar disorder, CKD3, COPD, DM2, GERD, HTN, HLD, and other conditions listed below, who presents to the ED with complaints of fall resulting in lower back pain.  Patient states that she lost her balance getting up on a curb, which she states can "sometimes happen", so she ended up falling sideways on her right side, and now complains of lower back pain.  She describes this pain as 9/10 constant sharp and dull nonradiating lower back pain across both sides of her lower back, worse with sitting, and somewhat improved with Tylenol that she took prior to the fall.  She also mentions that she had nausea, 2 episodes of nonbloody nonbilious emesis, and 2 episodes of nonbloody diarrhea this morning.  She denies hitting her head, losing consciousness, or any prodromal lightheadedness or other symptoms.  She denies any recent fevers, chills, chest pain, shortness of breath, abdominal pain, constipation, melena, hematochezia, dysuria, hematuria, incontinence of urine or stool, saddle anesthesia or cauda equina symptoms, new or worsening numbness or tingling, focal weakness, or any other complaints at this time.  The history is provided by the patient and medical records. No language interpreter was used.  Fall  Pertinent negatives include no chest pain, no abdominal pain and no shortness of breath.  Back Pain   Pertinent negatives include no chest pain, no fever, no numbness, no abdominal pain, no dysuria and no weakness.    Past Medical History:  Diagnosis Date  . Anxiety   . Bipolar disorder ([place])   . CANDIDIASIS, ESOPHAGEAL 07/28/2009   Qualifier: Diagnosis of  By: [name] MD, [name]   . Chronic kidney disease     CKD III  . COPD (chronic obstructive pulmonary disease) ([place])   . Depression   . Diabetes mellitus without complication (HCC)    no meds  . FH: colonic polyps   . Fractured elbow    right   . GERD (gastroesophageal reflux disease)   . HH (hiatus hernia)   . History of carpal tunnel syndrome   . History of chest pain   . History of transfusion of packed red blood cells   . Hyperlipidemia   . Hypertension   . Neuroleptic-induced tardive dyskinesia   . Osteoarthritis of more than one site   . Seasonal allergies     Patient Active Problem List   Diagnosis Date Noted  . Gangrene of finger ([place]) 10/09/2018  . Raynaud's phenomenon with gangrene ([place]) 10/09/2018  . LVH (left ventricular hypertrophy) 10/09/2018  . Vasospasm ([place]) 09/06/2018  . Hypotension 08/08/2018  . Leucocytosis 08/08/2018  . Acute gastritis without bleeding 08/08/2018  . Elevated troponin I level 08/08/2018  . Nausea and vomiting 08/06/2018  . Pneumonia 07/12/2018  . COPD exacerbation ([place]) 07/11/2018  . Primary osteoarthritis of both feet 12/21/2017  . DDD (degenerative disc disease), lumbar 12/21/2017  . Primary osteoarthritis of both knees 12/21/2017  . History of bilateral carpal tunnel release 12/21/2017  . DDD (degenerative disc disease), cervical 11/15/2017  . Former smoker 11/15/2017  . Bronchiectasis without complication ([place]) 78/29/5621  . Impingement syndrome of right shoulder 07/25/2017  . Hx of fusion of cervical spine 07/25/2017  . Cervical  spinal stenosis 09/27/2016  . Closed displaced fracture of proximal phalanx of lesser toe of right foot 09/08/2016  . Spinal stenosis of cervical region 09/08/2016  . Polypharmacy 06/23/2015  . Hyperlipidemia 04/19/2015  . Visit for preventive health examination 01/01/2015  . Primary osteoarthritis involving multiple joints 01/01/2015  . Bipolar affective disorder, currently depressed, moderate ([place])   . Bipolar I disorder with mania ([place]) 08/17/2014    . Foot cramps 07/03/2014  . Hypertension 10/21/2013  . Ear fullness 10/21/2013  . Dizziness 03/25/2013  . Medication withdrawal ([place]) 03/05/2013  . Diabetes mellitus with renal manifestations, controlled ([place]) 11/10/2012  . Alopecia areata 02/06/2012  . Obesity (BMI 30-39.9) 02/06/2012  . Renal insufficiency 07/16/2011  . Neuroleptic-induced tardive dyskinesia   . Exertional dyspnea 02/07/2011  . Dyspnea on exertion 01/19/2011  . DM (diabetes mellitus), type 2 ([place]) 04/22/2010  . Carpal tunnel syndrome 02/02/2010  . CONSTIPATION 02/02/2010  . Primary osteoarthritis of both hands 02/02/2010  . CATARACTS 04/13/2009  . PAIN IN JOINT, ANKLE AND FOOT 09/16/2008  . Eosinophilia 07/21/2008  . AFFECTIVE DISORDER 04/21/2008  . BACK PAIN, CHRONIC 02/12/2008  . LEG PAIN 02/12/2008  . ABNORMAL INVOLUNTARY MOVEMENTS 12/04/2007  . POSTURAL LIGHTHEADEDNESS 11/04/2007  . COLONIC POLYPS, HX OF 11/04/2007  . Essential hypertension 06/17/2007  . HYPERLIPIDEMIA 04/08/2007  . MITRAL VALVE PROLAPSE 04/08/2007  . GERD 04/08/2007  . LOW BACK PAIN SYNDROME 04/08/2007  . CHEST PAIN, RECURRENT 04/08/2007    Past Surgical History:  Procedure Laterality Date  . AMPUTATION Left 10/14/2018   Procedure: AMPUTATION LEFT LONG FINGER TIP;  Surgeon: Dayna Barker, MD;  Location: [facility];  Service: Plastics;  Laterality: Left;  . ANTERIOR CERVICAL DECOMP/DISCECTOMY FUSION  09/27/2016   C5-6 anterior cervical discectomy and fusion, allograft and plate/notes 09/27/2016  . ANTERIOR CERVICAL DECOMP/DISCECTOMY FUSION N/A 09/27/2016   Procedure: C5-6 Anterior Cervical Discectomy and Fusion, Allograft and Plate;  Surgeon: [name], MD;  Location: [facility];  Service: Orthopedics;  Laterality: N/A;  . Back Fusion  2002  . BIOPSY  07/19/2018   Procedure: BIOPSY;  Surgeon: [name], MD;  Location: [facility];  Service: Endoscopy;;  . CARPAL TUNNEL RELEASE  yates   left  . COLONOSCOPY N/A 01/05/2014   Procedure:  COLONOSCOPY;  Surgeon: [name], MD;  Location: [facility];  Service: Endoscopy;  Laterality: N/A;  . ELBOW SURGERY     age 53  . ESOPHAGOGASTRODUODENOSCOPY (EGD) WITH PROPOFOL N/A 07/19/2018   Procedure: ESOPHAGOGASTRODUODENOSCOPY (EGD) WITH PROPOFOL;  Surgeon: [name], MD;  Location: [facility];  Service: Endoscopy;  Laterality: N/A;  . EXTERNAL EAR SURGERY Left   . EYE SURGERY     "removed white dots under eyelid"  . FINGER SURGERY Left   . Juvara osteomy    . KNEE SURGERY    . NOSE SURGERY    . Rt. toe bunion    . skin, shave biopsy  05/03/2016   Left occipital scalp, top of scalp  . UPPER EXTREMITY ANGIOGRAPHY Bilateral 10/11/2018   Procedure: UPPER EXTREMITY ANGIOGRAPHY;  Surgeon: [name], MD;  Location: [facility];  Service: Cardiovascular;  Laterality: Bilateral;     OB History   No obstetric history on file.      Home Medications    Prior to Admission medications   Medication Sig Start Date End Date Taking? Authorizing Provider  acetaminophen (TYLENOL) 500 MG tablet Take 2 tablets (1,000 mg total) by mouth every 6 (six) hours as needed. Patient  taking differently: Take 1,000 mg by mouth every 6 (six) hours as needed for mild pain or headache.  09/13/15   [name], MD  amLODipine (NORVASC) 10 MG tablet Take 1 tablet (10 mg total) by mouth daily. 10/16/18 11/15/18  Dessa Phi, DO  atorvastatin (LIPITOR) 20 MG tablet TAKE 1 TABLET BY MOUTH ONCE DAILY Patient taking differently: Take 20 mg by mouth daily.  06/27/18   [name], [name], MD  Blood Glucose Monitoring Suppl (ACCU-CHEK NANO SMARTVIEW) W/DEVICE KIT Use to check blood sugar 1-2 times daily Patient taking differently: 1 strip by Other route 2 (two) times a week.  11/26/14   [name], [name], MD  cetirizine (ZYRTEC) 10 MG tablet Take 10 mg by mouth at bedtime.     [provider]  diclofenac sodium (VOLTAREN) 1 % GEL Apply 3 grams to 3 large joints, up to 3 times  daily. Patient taking differently: Apply 3 g topically See admin instructions. Apply 3 grams to three large joints, up to three times a day 01/29/18   Bo Merino, MD  docusate sodium (COLACE) 100 MG capsule Take 100 mg by mouth at bedtime.     [provider]  DULoxetine (CYMBALTA) 30 MG capsule 3 tabs qd. Patient taking differently: Take 30 mg by mouth every morning.  09/09/18   Shugart, Lissa Hoard, PA-C  feeding supplement, ENSURE ENLIVE, (ENSURE ENLIVE) LIQD Take 237 mLs by mouth 3 (three) times daily between meals. Patient taking differently: Take 237 mLs by mouth 2 (two) times daily after a meal.  07/20/18   [name], MD  fluticasone (FLONASE) 50 MCG/ACT nasal spray Place 1-2 sprays into both nostrils at bedtime.     [provider]  glucose blood test strip Accu-chek nano, test glucose once daily, dx E11.9 09/06/18   [name], [name], MD  guaiFENesin (MUCINEX) 600 MG 12 hr tablet Take 600 mg by mouth at bedtime.    [provider]  hydroxychloroquine (PLAQUENIL) 200 MG tablet Take 1 tablet (200 mg total) by mouth See admin instructions. Take 200 mg my mouth two times a day on Mon/Tues/Wed/Thurs/Fri, and nothing on Sat or Sun 10/16/18   Dessa Phi, DO  ipratropium-albuterol (DUONEB) 0.5-2.5 (3) MG/3ML SOLN INHALE CONTENTS OF 1 VIAL THREE TIMES A DAY IN NEBULIZER Patient taking differently: Take 3 mLs by nebulization 3 (three) times daily.  05/06/18   [name], MD  lamoTRIgine (LAMICTAL) 25 MG tablet Take 1 tablet (25 mg total) by mouth 2 (two) times daily. 09/09/18   Shugart, Lissa Hoard, PA-C  Lancets Misc. (ACCU-CHEK FASTCLIX LANCET) KIT USE TO CHECK BLOOD SUGAR 1-2 TIMES DAILY Dx E11.9 09/06/18   [name], [name], MD  linaclotide (LINZESS) 145 MCG CAPS capsule Take 145 mcg by mouth daily as needed (constipation).    [provider]  montelukast (SINGULAIR) 10 MG tablet TAKE 1 TABLET BY MOUTH AT BEDTIME Patient taking differently: Take 10 mg by  mouth at bedtime.  08/19/18   [name], MD  Multiple Vitamins-Minerals (CENTRUM SILVER 50+WOMEN) TABS Take 1 tablet by mouth daily.    [provider]  nitroGLYCERIN (NITRO-BID) 2 % ointment APPLY 1/4 INCH TOPICALLY TO AFFECTED FINGERTIPS THREE TIMES DAILY 10/18/18   Bo Merino, MD  NON FORMULARY Respivest: Three times a day for 30 minutes    [provider]  ondansetron (ZOFRAN-ODT) 4 MG disintegrating tablet DISSOLVE 1 TABLET(4 MG) ON THE TONGUE EVERY 8 HOURS AS NEEDED FOR NAUSEA OR VOMITING Patient taking differently:  Take 4 mg by mouth every 8 (eight) hours as needed for nausea or vomiting.  08/13/18   [name], [name], MD  OVER THE COUNTER MEDICATION Take 1 capsule by mouth See admin instructions. Fdgard capsules: Take 1 capsule by mouth once a day    [provider]  oxyCODONE (OXY IR/ROXICODONE) 5 MG immediate release tablet Take 1-2 tablets (5-10 mg total) by mouth every 6 (six) hours as needed for up to 7 days for moderate pain. 10/16/18 10/23/18  Dessa Phi, DO  pantoprazole (PROTONIX) 40 MG tablet Take 40 mg by mouth daily.    [provider]  Probiotic Product (ALIGN) 4 MG CAPS Take 4 mg by mouth daily.    [provider]  Respiratory Therapy Supplies (FLUTTER) DEVI 1 Device by Does not apply route as directed. Patient taking differently: 1 Device as directed.  10/24/17   Parrett, Fonnie Mu, NP  sodium chloride HYPERTONIC 3 % nebulizer solution Take by nebulization 2 (two) times daily. Patient taking differently: Take 4 mLs by nebulization 2 (two) times daily.  01/18/18   [name], MD  triamcinolone cream (KENALOG) 0.1 % Apply 1 application topically daily as needed (for itching of affected areas).  09/26/18   [provider]  umeclidinium-vilanterol (ANORO ELLIPTA) 62.5-25 MCG/INH AEPB Inhale 1 puff into the lungs daily. 07/26/18   [name], MD    Family History Family History  Problem Relation Age  of Onset  . Heart attack Father   . Heart disease Father   . Throat cancer Brother   . Diabetes Mother   . Hypertension Mother   . Heart attack Mother   . Diabetes type II Brother   . Heart disease Brother   . Lung cancer Brother   . Breast cancer Cousin   . Lung cancer Daughter   . Lung cancer Paternal Uncle     Social History Social History   Tobacco Use  . Smoking status: Former Smoker    Packs/day: 2.00    Years: 30.00    Pack years: 60.00    Types: Cigarettes    Last attempt to quit: 10/23/2001    Years since quitting: 17.0  . Smokeless tobacco: Never Used  Substance Use Topics  . Alcohol use: No  . Drug use: No     Allergies   Prednisone; Solu-medrol [methylprednisolone]; Amoxicillin; Codeine; Hydrocodone-acetaminophen; and Penicillins   Review of Systems Review of Systems  Constitutional: Negative for chills and fever.  HENT: Negative for facial swelling (no head inj).   Respiratory: Negative for shortness of breath.   Cardiovascular: Negative for chest pain.  Gastrointestinal: Positive for diarrhea, nausea and vomiting. Negative for abdominal pain, blood in stool and constipation.  Genitourinary: Negative for difficulty urinating (no incontinence), dysuria and hematuria.  Musculoskeletal: Positive for back pain. Negative for myalgias.  Skin: Negative for color change.  Allergic/Immunologic: Positive for immunocompromised state (DM2).  Neurological: Negative for syncope, weakness, light-headedness and numbness.  Psychiatric/Behavioral: Negative for confusion.   All other systems reviewed and are negative for acute change except as noted in the HPI.    Physical Exam Updated Vital Signs BP (!) 152/87 (BP Location: Left Arm)   Pulse 81   Temp 97.8 F (36.6 C) (Oral)   Resp 16   SpO2 99%   Physical Exam Vitals signs and nursing note reviewed.  Constitutional:      General: She is not in acute distress.    Appearance: Normal appearance. She is  well-developed. She is not toxic-appearing.     Comments: Afebrile, nontoxic, NAD  HENT:     Head: Normocephalic and atraumatic.  Eyes:     General:        Right eye: No discharge.        Left eye: No discharge.     Conjunctiva/sclera: Conjunctivae normal.  Neck:     Musculoskeletal: Normal range of motion and neck supple.  Cardiovascular:     Rate and Rhythm: Normal rate and regular rhythm.     Pulses: Normal pulses.     Heart sounds: Normal heart sounds, S1 normal and S2 normal. No murmur. No friction rub. No gallop.   Pulmonary:     Effort: Pulmonary effort is normal. No respiratory distress.     Breath sounds: Normal breath sounds. No decreased breath sounds, wheezing, rhonchi or rales.  Abdominal:     General: Bowel sounds are normal. There is no distension.     Palpations: Abdomen is soft. Abdomen is not rigid.     Tenderness: There is no abdominal tenderness. There is no right CVA tenderness, left CVA tenderness, guarding or rebound. Negative signs include Murphy's sign and McBurney's sign.     Comments: Soft, nondistended, +BS throughout, with mild soreness across entire abdomen but no actual TTP, no r/g/r, neg murphy's, neg mcburney's, no CVA TTP   Musculoskeletal: Normal range of motion.     Lumbar back: She exhibits tenderness, bony tenderness and spasm. She exhibits normal range of motion and no deformity.       Back:     Comments: Lumbar spine with FROM intact with diffuse spinous process TTP throughout entire L spine, no bony stepoffs or deformities, with mild b/l paraspinous muscle TTP and some slight muscle spasms. No overlying skin changes. Strength and sensation grossly intact in all extremities, gait steady and nonantalgic. Distal pulses intact.   Skin:    General: Skin is warm and dry.     Findings: No rash.  Neurological:     Mental Status: She is alert and oriented to person, place, and time.     Sensory: Sensation is intact. No sensory deficit.     Motor:  Motor function is intact.  Psychiatric:        Mood and Affect: Mood and affect normal.        Behavior: Behavior normal.      ED Treatments / Results  Labs (all labs ordered are listed, but only abnormal results are displayed) Labs Reviewed  CBC WITH DIFFERENTIAL/PLATELET - Abnormal; Notable for the following components:      Result Value   WBC 20.1 (*)    RBC 3.57 (*)    Hemoglobin 10.5 (*)    HCT 33.9 (*)    RDW 17.7 (*)    Platelets 488 (*)    Neutro Abs 7.9 (*)    Eosinophils Absolute 8.2 (*)    Basophils Absolute 0.2 (*)    Abs Immature Granulocytes 0.10 (*)    All other components within normal limits  COMPREHENSIVE METABOLIC PANEL - Abnormal; Notable for the following components:   Creatinine, Ser 1.30 (*)    Calcium 8.6 (*)    Total Protein 8.4 (*)    Albumin 2.5 (*)    GFR calc non Af Amer 43 (*)    GFR calc Af Amer 50 (*)    All other components within normal limits  URINALYSIS, ROUTINE W REFLEX MICROSCOPIC - Abnormal; Notable for the following components:  Protein, ur 100 (*)    Leukocytes, UA TRACE (*)    Bacteria, UA RARE (*)    All other components within normal limits  URINE CULTURE  LIPASE, BLOOD  PATHOLOGIST SMEAR REVIEW    EKG None  Radiology Dg Lumbar Spine Complete  Result Date: 10/22/2018 CLINICAL DATA:  Fall with back pain EXAM: LUMBAR SPINE - COMPLETE 4+ VIEW COMPARISON:  CT 08/06/2018 FINDINGS: Posterior rods and fixating screws with interbody device at L4-L5. Vertebral body heights are maintained. Mild degenerative change L1-L2. Mild to moderate degenerative change L5-S1. IMPRESSION: Postsurgical changes at L4-L5.  No acute osseous abnormality. Electronically Signed   By: Donavan Foil M.D.   On: 10/22/2018 18:04    Procedures Procedures (including critical care time)  Medications Ordered in ED Medications  ondansetron (ZOFRAN-ODT) disintegrating tablet 8 mg (8 mg Oral Given 10/22/18 1644)     Initial Impression / Assessment and  Plan / ED Course  I have reviewed the triage vital signs and the nursing notes.  Pertinent labs & imaging results that were available during my care of the patient were reviewed by me and considered in my medical decision making (see chart for details).     67 y.o. female here with multiple complaints, primary complaint is mechanical fall and low back pain; on exam, diffuse lumbar spinous process and paraspinous muscle TTP; no red flag s/sx of back pain, ambulatory with steady gait. Will get xray of lumbar spine. Also c/o n/v/d this morning. Abd soft and without significant tenderness, states some soreness but not really tender. Doubt need for imaging, will get labs, give zofran, and reassess shortly. Discussed case with my attending Dr. Tyrone Nine who agrees with plan.   7:42 PM CBC w/diff with WBC 20.1 which is actually slightly improved from recent labs, eosinophilic predominance as noted in prior labs as well as per oncology in 08/2018; also with stable/improving anemia and thrombocytosis. CMP with stable kidney function and otherwise unremarkable. Lipase WNL. U/A with trace leuks, 6-10 WBCs, rare bacteria, mucus present; doubt UTI, likely from contaminated sample, but will send for UCx, doubt need for treatment at this time given lack of other symptoms. Lumbar xray neg for acute findings. Likely muscle strain from mechanical fall, doubt need for other emergent work up at this time. N/V/D could be viral gastroenteritis, pt with benign abd exam, doubt need for further emergent work up at this time. Pt feeling better and tolerating PO well here. Rx for zofran given, advised BRAT diet, discussed other OTC remedies for symptomatic relief of her n/v/d and of her back pain, heat use advised, and f/up with PCP in 1wk for recheck. I explained the diagnosis and have given explicit precautions to return to the ER including for any other new or worsening symptoms. The patient understands and accepts the medical plan  as it's been dictated and I have answered their questions. Discharge instructions concerning home care and prescriptions have been given. The patient is STABLE and is discharged to home in good condition.    Final Clinical Impressions(s) / ED Diagnoses   Final diagnoses:  Acute bilateral low back pain without sciatica  Fall, initial encounter  Nausea vomiting and diarrhea  Eosinophilia    ED Discharge Orders         Ordered    ondansetron (ZOFRAN ODT) 4 MG disintegrating tablet  Every 8 hours PRN     10/22/18 [address], [place], [place] 10/22/18  [place], Corley, DO 10/22/18 1950

## 2018-10-22 NOTE — ED Notes (Signed)
Patient given gingerale for PO fluid challenge.  Pt requesting something for pain and reports she is driving home

## 2018-10-22 NOTE — Discharge Instructions (Addendum)
Use heat to your back to help with pain. Use zofran as prescribed, as needed for nausea. Alternate between tylenol and motrin as needed for pain. May consider using over the counter tums, maalox, pepto bismol, or other over the counter remedies to help with symptoms. Stay well hydrated with small sips of fluids throughout the day. Follow a BRAT (banana-rice-applesauce-toast) diet as described below for the next 24-48 hours. The 'BRAT' diet is suggested, then progress to diet as tolerated as symptoms abate. Call your regular doctor if bloody stools, persistent diarrhea, vomiting, fever or abdominal pain. Follow up with your regular doctor in 1 week for recheck of symptoms. Return to ER for changing or worsening of symptoms.

## 2018-10-22 NOTE — ED Triage Notes (Signed)
Per EMS, patient from restaurant, c/o lower back pain after trip and fall. Denies head injury and LOC.

## 2018-10-22 NOTE — Patient Instructions (Addendum)
Dr   Izora Ribas did the surgery  .  And we will try to help  Get you an appt with correct surgery .   But they are closed today   I am not  Ordering blood tests today and advising that Dr D.  coordinate this  .    Call home health  home health can help wih wound management and other things .  I suspect the lung gi and  Hand issues are from the same process . And  Agree with seeing specialist  At [facility] .

## 2018-10-22 NOTE — Telephone Encounter (Signed)
Prior Authorization has been denied.
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
## 2018-10-24 ENCOUNTER — Telehealth: Payer: Self-pay

## 2018-10-24 ENCOUNTER — Telehealth: Payer: Self-pay | Admitting: Rheumatology

## 2018-10-24 LAB — URINE CULTURE: Culture: 20000 — AB

## 2018-10-24 LAB — PATHOLOGIST SMEAR REVIEW

## 2018-10-24 NOTE — Telephone Encounter (Signed)
Patient left a voicemail stating she was returning a call to the office.   °

## 2018-10-24 NOTE — Telephone Encounter (Signed)
Called Dr. Dyane Dustman Coley's office to make sure that pt had a follow up appt. She has a follow up appt January 8th

## 2018-10-25 ENCOUNTER — Other Ambulatory Visit: Payer: Self-pay | Admitting: Rheumatology

## 2018-10-25 ENCOUNTER — Ambulatory Visit: Payer: Self-pay | Admitting: *Deleted

## 2018-10-25 ENCOUNTER — Other Ambulatory Visit: Payer: Self-pay | Admitting: *Deleted

## 2018-10-25 DIAGNOSIS — Z79899 Other long term (current) drug therapy: Secondary | ICD-10-CM | POA: Diagnosis not present

## 2018-10-25 DIAGNOSIS — I7301 Raynaud's syndrome with gangrene: Secondary | ICD-10-CM | POA: Diagnosis not present
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
## 2018-10-25 NOTE — Telephone Encounter (Signed)
Last Visit: 10/21/18 Next visit: 10/28/18 Labs: 10/22/18 stable  Okay to refill per Dr. Corliss Skains

## 2018-10-25 NOTE — Telephone Encounter (Signed)
Patient called back to let you know her eye exam appt is scheduled for 11/21/18.
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
## 2018-10-25 NOTE — Telephone Encounter (Signed)
Patient advised she had received a call for the reminder of her appointment on 10/28/18. Patient reminded she needs to have a PLQ eye exam.

## 2018-10-28 ENCOUNTER — Encounter: Payer: Self-pay | Admitting: Rheumatology

## 2018-10-28 ENCOUNTER — Ambulatory Visit (INDEPENDENT_AMBULATORY_CARE_PROVIDER_SITE_OTHER): Payer: Medicare HMO | Admitting: Rheumatology

## 2018-10-28 ENCOUNTER — Ambulatory Visit: Payer: Self-pay | Admitting: Physician Assistant

## 2018-10-28 VITALS — BP 110/66 | HR 115 | Resp 14 | Ht 62.5 in | Wt 125.6 lb

## 2018-10-28 DIAGNOSIS — L639 Alopecia areata, unspecified: Secondary | ICD-10-CM

## 2018-10-28 DIAGNOSIS — J479 Bronchiectasis, uncomplicated: Secondary | ICD-10-CM

## 2018-10-28 DIAGNOSIS — G2401 Drug induced subacute dyskinesia: Secondary | ICD-10-CM

## 2018-10-28 DIAGNOSIS — R7689 Other specified abnormal immunological findings in serum: Secondary | ICD-10-CM

## 2018-10-28 DIAGNOSIS — M19072 Primary osteoarthritis, left ankle and foot: Secondary | ICD-10-CM

## 2018-10-28 DIAGNOSIS — M19071 Primary osteoarthritis, right ankle and foot: Secondary | ICD-10-CM

## 2018-10-28 DIAGNOSIS — R768 Other specified abnormal immunological findings in serum: Secondary | ICD-10-CM

## 2018-10-28 DIAGNOSIS — M51369 Other intervertebral disc degeneration, lumbar region without mention of lumbar back pain or lower extremity pain: Secondary | ICD-10-CM

## 2018-10-28 DIAGNOSIS — Z79899 Other long term (current) drug therapy: Secondary | ICD-10-CM | POA: Diagnosis not present

## 2018-10-28 DIAGNOSIS — M5030 Other cervical disc degeneration, unspecified cervical region: Secondary | ICD-10-CM | POA: Diagnosis not present

## 2018-10-28 DIAGNOSIS — M5136 Other intervertebral disc degeneration, lumbar region: Secondary | ICD-10-CM

## 2018-10-28 DIAGNOSIS — T43505A Adverse effect of unspecified antipsychotics and neuroleptics, initial encounter: Secondary | ICD-10-CM

## 2018-10-28 DIAGNOSIS — Z8659 Personal history of other mental and behavioral disorders: Secondary | ICD-10-CM

## 2018-10-28 DIAGNOSIS — I7301 Raynaud's syndrome with gangrene: Secondary | ICD-10-CM

## 2018-10-28 DIAGNOSIS — Z9889 Other specified postprocedural states: Secondary | ICD-10-CM

## 2018-10-28 DIAGNOSIS — M7541 Impingement syndrome of right shoulder: Secondary | ICD-10-CM | POA: Diagnosis not present

## 2018-10-28 DIAGNOSIS — M19041 Primary osteoarthritis, right hand: Secondary | ICD-10-CM | POA: Diagnosis not present

## 2018-10-28 DIAGNOSIS — D721 Eosinophilia, unspecified: Secondary | ICD-10-CM

## 2018-10-28 DIAGNOSIS — Z8639 Personal history of other endocrine, nutritional and metabolic disease: Secondary | ICD-10-CM

## 2018-10-28 DIAGNOSIS — Z87891 Personal history of nicotine dependence: Secondary | ICD-10-CM

## 2018-10-28 DIAGNOSIS — M170 Bilateral primary osteoarthritis of knee: Secondary | ICD-10-CM | POA: Diagnosis not present

## 2018-10-28 DIAGNOSIS — M19042 Primary osteoarthritis, left hand: Secondary | ICD-10-CM

## 2018-10-28 DIAGNOSIS — I10 Essential (primary) hypertension: Secondary | ICD-10-CM

## 2018-10-28 DIAGNOSIS — E782 Mixed hyperlipidemia: Secondary | ICD-10-CM

## 2018-10-28 DIAGNOSIS — R296 Repeated falls: Secondary | ICD-10-CM

## 2018-10-28 NOTE — Progress Notes (Signed)
Cardiology Office Note   Date:  10/29/2018   ID:  Desiree [name], DOB [birth year], MRN [id number]  PCP:  [name], MD  Cardiologist:   [name], MD   No chief complaint on file.     History of Present Illness: 68 y.o. f/u chest pain. History of DM, previous smoker quit in 2002. Bipolar. Seen by rheumatology for Raynauds lab work negativ efor lupus anticoagulant , ANA and cardiolipin. Had normal myovue in 2016 TTE October 2019 with severe LVH EF 60-65%  December 2019 developed ulcers on hands/finger tips found to have right radial occlusion and left ulnar occlusion ? 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 Angiogram with no suitable distal bypass target She is to f/u with Dr [name] from Rheumatology   Has some muscle aches and pains over quads not clear if it is related to statin or RA  Past Medical History:  Diagnosis Date  . Anxiety   . Bipolar disorder ([name])   . CANDIDIASIS, ESOPHAGEAL 07/28/2009   Qualifier: Diagnosis of  By: [name] MD, [name]   . Chronic kidney disease    CKD III  . COPD (chronic obstructive pulmonary disease) ([place])   . Depression   . Diabetes mellitus without complication (HCC)    no meds  . FH: colonic polyps   . Fractured elbow    right   . GERD (gastroesophageal reflux disease)   . HH (hiatus hernia)   . History of carpal tunnel syndrome   . History of chest pain   . History of transfusion of packed red blood cells   . Hyperlipidemia   . Hypertension   . Neuroleptic-induced tardive dyskinesia   . Osteoarthritis of more than one site   . Seasonal allergies     Past Surgical History:  Procedure Laterality Date  . AMPUTATION Left 10/14/2018   Procedure: AMPUTATION LEFT LONG FINGER TIP;  Surgeon: Dayna Barker, MD;  Location: [facility];  Service: Plastics;  Laterality: Left;  . ANTERIOR CERVICAL DECOMP/DISCECTOMY FUSION  09/27/2016   C5-6 anterior cervical discectomy and fusion,  allograft and plate/notes 09/27/2016  . ANTERIOR CERVICAL DECOMP/DISCECTOMY FUSION N/A 09/27/2016   Procedure: C5-6 Anterior Cervical Discectomy and Fusion, Allograft and Plate;  Surgeon: [name], MD;  Location: [facility];  Service: Orthopedics;  Laterality: N/A;  . Back Fusion  2002  . BIOPSY  07/19/2018   Procedure: BIOPSY;  Surgeon: [name], MD;  Location: [facility];  Service: Endoscopy;;  . CARPAL TUNNEL RELEASE  yates   left  . COLONOSCOPY N/A 01/05/2014   Procedure: COLONOSCOPY;  Surgeon: [name], MD;  Location: [facility];  Service: Endoscopy;  Laterality: N/A;  . ELBOW SURGERY     age 74  . ESOPHAGOGASTRODUODENOSCOPY (EGD) WITH PROPOFOL N/A 07/19/2018   Procedure: ESOPHAGOGASTRODUODENOSCOPY (EGD) WITH PROPOFOL;  Surgeon: [name], MD;  Location: [facility];  Service: Endoscopy;  Laterality: N/A;  . EXTERNAL EAR SURGERY Left   . EYE SURGERY     "removed white dots under eyelid"  . FINGER SURGERY Left   . Juvara osteomy    . KNEE SURGERY    . NOSE SURGERY    . Rt. toe bunion    . skin, shave biopsy  05/03/2016   Left occipital scalp, top of scalp  . UPPER EXTREMITY ANGIOGRAPHY Bilateral 10/11/2018   Procedure: UPPER EXTREMITY ANGIOGRAPHY;  Surgeon: [name], MD;  Location: [facility]  [facility];  Service: Cardiovascular;  Laterality: Bilateral;     Current Outpatient Medications  Medication Sig Dispense Refill  . acetaminophen (TYLENOL) 500 MG tablet Take 2 tablets (1,000 mg total) by mouth every 6 (six) hours as needed. (Patient taking differently: Take 1,000 mg by mouth every 6 (six) hours as needed for mild pain or headache. ) 30 tablet 0  . amLODipine (NORVASC) 10 MG tablet Take 1 tablet (10 mg total) by mouth daily. 30 tablet 0  . aspirin EC 81 MG tablet Take by mouth.    Marland Kitchen atorvastatin (LIPITOR) 20 MG tablet TAKE 1 TABLET BY MOUTH ONCE DAILY (Patient taking differently: Take 20 mg by mouth daily. ) 90 tablet 0  . Blood Glucose Monitoring Suppl  (ACCU-CHEK NANO SMARTVIEW) W/DEVICE KIT Use to check blood sugar 1-2 times daily (Patient taking differently: 1 strip by Other route 2 (two) times a week. ) 1 kit 0  . cetirizine (ZYRTEC) 10 MG tablet Take 10 mg by mouth at bedtime.     . diclofenac sodium (VOLTAREN) 1 % GEL Apply 3 grams to 3 large joints, up to 3 times daily. (Patient taking differently: Apply 3 g topically See admin instructions. Apply 3 grams to three large joints, up to three times a day) 3 Tube 3  . docusate sodium (COLACE) 100 MG capsule Take 100 mg by mouth at bedtime.     . DULoxetine (CYMBALTA) 30 MG capsule 3 tabs qd. (Patient taking differently: Take 30 mg by mouth every morning. ) 90 capsule 1  . feeding supplement, ENSURE ENLIVE, (ENSURE ENLIVE) LIQD Take 237 mLs by mouth 3 (three) times daily between meals. (Patient taking differently: Take 237 mLs by mouth 2 (two) times daily after a meal. ) 14 Bottle 0  . fluticasone (FLONASE) 50 MCG/ACT nasal spray Place 1-2 sprays into both nostrils at bedtime.     Marland Kitchen glucose blood test strip Accu-chek nano, test glucose once daily, dx E11.9 100 each 12  . guaiFENesin (MUCINEX) 600 MG 12 hr tablet Take 600 mg by mouth at bedtime.    . hydroxychloroquine (PLAQUENIL) 200 MG tablet TAKE 1 TABLET BY MOUTH TWICE DAILY, MONDAY-FRIDAY. NONE ON SATURDAY OR SUNDAY 40 tablet 0  . ipratropium-albuterol (DUONEB) 0.5-2.5 (3) MG/3ML SOLN INHALE CONTENTS OF 1 VIAL THREE TIMES A DAY IN NEBULIZER (Patient taking differently: Take 3 mLs by nebulization 3 (three) times daily. ) 1080 mL 4  . lamoTRIgine (LAMICTAL) 25 MG tablet Take 1 tablet (25 mg total) by mouth 2 (two) times daily. 60 tablet 1  . Lancets Misc. (ACCU-CHEK FASTCLIX LANCET) KIT USE TO CHECK BLOOD SUGAR 1-2 TIMES DAILY Dx E11.9 1 kit 1  . linaclotide (LINZESS) 145 MCG CAPS capsule Take 145 mcg by mouth daily as needed (constipation).    Marland Kitchen losartan (COZAAR) 50 MG tablet     . montelukast (SINGULAIR) 10 MG tablet TAKE 1 TABLET BY MOUTH AT  BEDTIME (Patient taking differently: Take 10 mg by mouth at bedtime. ) 90 tablet 0  . Multiple Vitamins-Minerals (CENTRUM SILVER 50+WOMEN) TABS Take 1 tablet by mouth daily.    . nitroGLYCERIN (NITRO-BID) 2 % ointment APPLY 1/4 INCH TOPICALLY TO AFFECTED FINGERTIPS THREE TIMES DAILY 30 g 0  . NON FORMULARY Respivest: Three times a day for 30 minutes    . ondansetron (ZOFRAN ODT) 4 MG disintegrating tablet Take 1 tablet (4 mg total) by mouth every 8 (eight) hours as needed for nausea or vomiting. 15 tablet 0  . ondansetron (ZOFRAN-ODT) 4  MG disintegrating tablet DISSOLVE 1 TABLET(4 MG) ON THE TONGUE EVERY 8 HOURS AS NEEDED FOR NAUSEA OR VOMITING (Patient taking differently: Take 4 mg by mouth every 8 (eight) hours as needed for nausea or vomiting. ) 20 tablet 0  . OVER THE COUNTER MEDICATION Take 1 capsule by mouth See admin instructions. Fdgard capsules: Take 1 capsule by mouth once a day    . oxyCODONE (OXY IR/ROXICODONE) 5 MG immediate release tablet     . pantoprazole (PROTONIX) 40 MG tablet Take 40 mg by mouth daily.    . Probiotic Product (ALIGN) 4 MG CAPS Take 4 mg by mouth daily.    Marland Kitchen Respiratory Therapy Supplies (FLUTTER) DEVI 1 Device by Does not apply route as directed. (Patient taking differently: 1 Device as directed. ) 1 each 0  . sodium chloride HYPERTONIC 3 % nebulizer solution Take by nebulization 2 (two) times daily. (Patient taking differently: Take 4 mLs by nebulization 2 (two) times daily. ) 750 mL 5  . triamcinolone cream (KENALOG) 0.1 % Apply 1 application topically daily as needed (for itching of affected areas).   0  . umeclidinium-vilanterol (ANORO ELLIPTA) 62.5-25 MCG/INH AEPB Inhale 1 puff into the lungs daily. 60 each 5   No current facility-administered medications for this visit.     Allergies:   Prednisone; Solu-medrol [methylprednisolone]; Amoxicillin; Codeine; Hydrocodone-acetaminophen; and Penicillins    Social History:  The patient  reports that she quit  smoking about 17 years ago. Her smoking use included cigarettes. She has a 60.00 pack-year smoking history. She has never used smokeless tobacco. She reports that she does not drink alcohol or use drugs.   Family History:  The patient's family history includes Breast cancer in her cousin; Diabetes in her mother; Diabetes type II in her brother; Heart attack in her father and mother; Heart disease in her brother and father; Hypertension in her mother; Lung cancer in her brother, daughter, and paternal uncle; Throat cancer in her brother.    ROS:  Please see the history of present illness.   Otherwise, review of systems are positive for none.   All other systems are reviewed and negative.    PHYSICAL EXAM: VS:  BP 138/76   Pulse 84   Ht 5' 2.5" (1.588 m)   Wt 121 lb (54.9 kg)   SpO2 97%   BMI 21.78 kg/m  , BMI Body mass index is 21.78 kg/m. Affect appropriate Overweight black female  HEENT: normal Neck supple with no adenopathy JVP normal no bruits no thyromegaly Lungs clear with no wheezing and good diaphragmatic motion Heart:  S1/S2 no murmur, no rub, gallop or click PMI normal Abdomen: benighn, BS positve, no tenderness, no AAA no bruit.  No HSM or HJR Distal pulses intact with no bruits No edema Neuro non-focal Post amputation of left middle finger Raynauds digits right middle finger dry gangrene  No muscular weakness     EKG:  10/14/18 SR rate 91 nonspecific ST changes    Recent Labs: 10/10/2018: Magnesium 1.8; TSH 2.153 10/22/2018: ALT 20; BUN 19; Creatinine, Ser 1.30; Hemoglobin 10.5; Platelets 488; Potassium 4.2; Sodium 139    Lipid Panel    Component Value Date/Time   CHOL 168 08/07/2017 1527   TRIG 74.0 08/07/2017 1527   HDL 64.50 08/07/2017 1527   CHOLHDL 3 08/07/2017 1527   VLDL 14.8 08/07/2017 1527   LDLCALC 89 08/07/2017 1527   LDLDIRECT 146.3 01/26/2010 0000      Wt Readings from Last 3 Encounters:  10/29/18 121 lb (54.9 kg)  10/28/18 125 lb 9.6  oz (57 kg)  10/22/18 122 lb 11.2 oz (55.7 kg)      Other studies Reviewed: Additional studies/ records that were reviewed today include: Notes Dr Erin Hearing and brackbill 2016 myovue 2016 recent echo Notes primary care .    ASSESSMENT AND PLAN:  1.  Abnormal Echo:  Severe LVH  18 mm septum previously described small LVOT gradient  No murmur on exam  Avoid dehydration consider f/u echo for new symptoms can consider MRI 2. Chest pain atypical normal myovue 2016 no chest pain observe 3. Bipolar seem compensated and functional  4. DM Discussed low carb diet.  Target hemoglobin A1c is 6.5 or less.  Continue current medications. 5. HtN:  Well controlled.  Continue current medications and low sodium Dash type diet.   6. Raynauds: complicated by dry gangrene and amputation of left long finger. F/U with rheumatology continue  Calcium blocker and nitro ointment Keep hands warm in winter temps Refer to [facility] for possible digital sympathetectomy Per Rheumatology   Will stop lipitor for 4 weeks and see if it helps her myalgias    Current medicines are reviewed at length with the patient today.  The patient does not have concerns regarding medicines.  The following changes have been made:  none  Labs/ tests ordered today include: Lexi Myovue  No orders of the defined types were placed in this encounter.    Disposition:   FU with cardiology in a year      Signed, [name], MD  10/29/2018 4:57 PM    [facility] [address], [place], [place]  [address] Phone: [phone]; Fax: [phone]
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
## 2018-10-29 ENCOUNTER — Encounter: Payer: Self-pay | Admitting: Cardiovascular Disease

## 2018-10-29 ENCOUNTER — Ambulatory Visit: Payer: Medicare HMO | Admitting: Cardiovascular Disease

## 2018-10-29 ENCOUNTER — Other Ambulatory Visit: Payer: Self-pay

## 2018-10-29 VITALS — BP 138/76 | HR 84 | Ht 62.5 in | Wt 121.0 lb

## 2018-10-29 DIAGNOSIS — R0789 Other chest pain: Secondary | ICD-10-CM | POA: Diagnosis not present

## 2018-10-29 DIAGNOSIS — J479 Bronchiectasis, uncomplicated: Secondary | ICD-10-CM | POA: Diagnosis not present

## 2018-10-29 DIAGNOSIS — I10 Essential (primary) hypertension: Secondary | ICD-10-CM | POA: Diagnosis not present

## 2018-10-29 DIAGNOSIS — R931 Abnormal findings on diagnostic imaging of heart and coronary circulation: Secondary | ICD-10-CM

## 2018-10-29 NOTE — Patient Outreach (Signed)
[facility]) Care Management  10/29/2018  Desiree [name] [birth year] [id number]  BSW attempted to contact the patient on today's date to conduct a community resource consult. Unfortunately, today's call was unsuccessful. BSW left the patient a HIPAA compliant voice message requesting a return call.   Plan: BSW will mail the patient an unsuccessful outreach letter. BSW will attempt the patient again within the next four business days.  [name], BSW, CDP [facility] [phone]

## 2018-10-29 NOTE — Patient Instructions (Addendum)
Medication Instructions:  Your physician has recommended you make the following change in your medication:  1-Hold your lipitor for 4 weeks to see if this helps with your muscle pain.  If you need a refill on your cardiac medications before your next appointment, please call your pharmacy.   Lab work:  If you have labs (blood work) drawn today and your tests are completely normal, you will receive your results only by: Marland Kitchen MyChart Message (if you have MyChart) OR . A paper copy in the mail If you have any lab test that is abnormal or we need to change your treatment, we will call you to review the results.  Testing/Procedures: None ordered today.  Follow-Up: At [facility], you and your health needs are our priority.  As part of our continuing mission to provide you with exceptional heart care, we have created designated Provider Care Teams.  These Care Teams include your primary Cardiologist (physician) and Advanced Practice Providers (APPs -  Physician Assistants and Nurse Practitioners) who all work together to provide you with the care you need, when you need it. Your physician recommends that you schedule a follow-up appointment as needed with Dr. Eden Emms.

## 2018-10-30 ENCOUNTER — Telehealth: Payer: Self-pay | Admitting: Rheumatology

## 2018-10-30 ENCOUNTER — Other Ambulatory Visit: Payer: Self-pay

## 2018-10-30 ENCOUNTER — Telehealth: Payer: Self-pay

## 2018-10-30 NOTE — Telephone Encounter (Signed)
Spoke with Dr. Berline Lopes medical assistant at [facility] to clarify the plan of care. They would like for our office to proceed with the digital sympathectomy.   Finger amputation was done by Dr. Izora Ribas.  We will send him a message to proceed with digital sympathectomy, per Dr. Corliss Skains.

## 2018-10-30 NOTE — Telephone Encounter (Signed)
Dr. Izora Ribas,  Desiree [name] is a mutual patient. She has severe Raynauds with gangrene.  You recently amputated her left middle finger distal phalanx.  She had several healing ulcers on her fingertips during her last visit with me on October 28, 2018.  Right middle finger distal phalanx has 2 large ulcers which are of concern.  If you could kindly evaluate her for possible digital sympathectomy I would really appreciate it. Thank you [name], MD

## 2018-10-30 NOTE — Patient Outreach (Signed)
[facility]) Care Management  10/30/2018  Desiree [name] Mar 06, 1951 [id number]  BSW received a voice message from the patient returning this BSW's call from 10/29/18. BSW placed an unsuccessful outreach attempt to the patient. BSW left a HIPAA compliant voice message requesting a return call. BSW will follow up with the patient as previously planned.  [name], BSW, CDP [facility] [phone]
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
## 2018-11-01 ENCOUNTER — Ambulatory Visit: Payer: Medicare HMO | Admitting: Internal Medicine

## 2018-11-04 ENCOUNTER — Other Ambulatory Visit: Payer: Self-pay | Admitting: Psychiatry

## 2018-11-04 ENCOUNTER — Ambulatory Visit: Payer: Medicare HMO

## 2018-11-04 ENCOUNTER — Ambulatory Visit: Payer: Self-pay | Admitting: Physician Assistant

## 2018-11-05 ENCOUNTER — Ambulatory Visit (INDEPENDENT_AMBULATORY_CARE_PROVIDER_SITE_OTHER): Payer: Medicare HMO | Admitting: Internal Medicine

## 2018-11-05 ENCOUNTER — Other Ambulatory Visit: Payer: Self-pay | Admitting: Pulmonary Disease

## 2018-11-05 ENCOUNTER — Encounter: Payer: Self-pay | Admitting: Internal Medicine

## 2018-11-05 ENCOUNTER — Ambulatory Visit: Payer: Medicare HMO | Admitting: Psychiatry

## 2018-11-05 VITALS — BP 126/72 | HR 88 | Temp 99.1°F | Wt 117.3 lb

## 2018-11-05 DIAGNOSIS — N289 Disorder of kidney and ureter, unspecified: Secondary | ICD-10-CM

## 2018-11-05 DIAGNOSIS — Z9181 History of falling: Secondary | ICD-10-CM

## 2018-11-05 DIAGNOSIS — D721 Eosinophilia: Secondary | ICD-10-CM

## 2018-11-05 DIAGNOSIS — R112 Nausea with vomiting, unspecified: Secondary | ICD-10-CM | POA: Diagnosis not present

## 2018-11-05 DIAGNOSIS — I7301 Raynaud's syndrome with gangrene: Secondary | ICD-10-CM

## 2018-11-05 DIAGNOSIS — R531 Weakness: Secondary | ICD-10-CM

## 2018-11-05 DIAGNOSIS — J479 Bronchiectasis, uncomplicated: Secondary | ICD-10-CM | POA: Diagnosis not present

## 2018-11-05 DIAGNOSIS — R269 Unspecified abnormalities of gait and mobility: Secondary | ICD-10-CM | POA: Diagnosis not present

## 2018-11-05 DIAGNOSIS — R634 Abnormal weight loss: Secondary | ICD-10-CM | POA: Diagnosis not present

## 2018-11-05 DIAGNOSIS — D7219 Other eosinophilia: Secondary | ICD-10-CM
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
## 2018-11-05 NOTE — Patient Instructions (Addendum)
I am concerned that  Your falling is from  weaknesses form weight loss and decrease nutrition.  And  Possible from your back .  Initiate  Home health for physical therapy for fall.   prevention  and occupational therapy. Nutrition.   Problem.  Wt Readings from Last 3 Encounters:  11/05/18 117 lb 4.8 oz (53.2 kg)  10/29/18 121 lb (54.9 kg)  10/28/18 125 lb 9.6 oz (57 kg)   Getting  GI  Involved or help .   Try to get appt with  Dr Loreta Ave and then go from there

## 2018-11-06 ENCOUNTER — Other Ambulatory Visit: Payer: Self-pay

## 2018-11-06 ENCOUNTER — Other Ambulatory Visit: Payer: Self-pay | Admitting: *Deleted

## 2018-11-06 ENCOUNTER — Telehealth: Payer: Self-pay | Admitting: Rheumatology

## 2018-11-06 DIAGNOSIS — I7301 Raynaud's syndrome with gangrene: Secondary | ICD-10-CM

## 2018-11-06 DIAGNOSIS — E1122 Type 2 diabetes mellitus with diabetic chronic kidney disease: Secondary | ICD-10-CM

## 2018-11-06 DIAGNOSIS — R634 Abnormal weight loss: Secondary | ICD-10-CM

## 2018-11-06 DIAGNOSIS — M159 Polyosteoarthritis, unspecified: Secondary | ICD-10-CM
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
## 2018-11-06 NOTE — Patient Outreach (Signed)
[facility]) Care Management  11/06/2018  Desiree [name] Sep 10, 1951 [id number]   Call placed to member to follow up on current health status and contact with BSW.  She report progressing but now have another finger affected.  Stitches were taken out today of the finger that was amputated.  She is awaiting approval from insurance to develop plan of care for the next finger.  She will either have another amputation versus procedure to repair finger.  Follow up with rheumatology tomorrow.  Confirms she has spoken to [facility] but has not checked mailbox to obtain information on community resources that were sent.  State she still has the help from her niece periodically, waiting to see when she is available again.  Denies any urgent concerns at this time, advised to contact with questions.  Will follow up within the next month.  [facility] CM Care Plan Problem Two     Most Recent Value  Care Plan Problem Two  Risk for complication of Raynaud's disease as evidenced by recent hospitalization requiring amputation of finger   Role Documenting the Problem Two  Care Management [place] for Problem Two  Active  Interventions for Problem Two Long Term Goal   Confirmed member has been in contact with BSW to discuss in home assistance.  Advised to check mailbox for resources  [facility] Long Term Goal  Member will report increased support in the home within the next 45 days  [facility] Long Term Goal Start Date  10/25/18  [facility] CM Short Term Goal #1   Member will not show sign/symptoms of infection over the next 3 weeks  [facility] CM Short Term Goal #1 Start Date  10/25/18  Interventions for Short Term Goal #2   Confirmed with member there is plan of care to continue treatment of affected digits pending insurance  [facility] CM Short Term Goal #2   Member will keep and attend all MD appointments within the next week  [facility] CM Short Term Goal #2 Start Date  10/25/18  [facility] CM Short Term Goal #2 Met Date  11/06/18      Valente David, RN, MSN [place] Manager [phone]

## 2018-11-06 NOTE — Progress Notes (Signed)
Orders have been placed and referral has been placed to Dr.Manns

## 2018-11-06 NOTE — Telephone Encounter (Signed)
Patient states she went to see Dr. Izora Ribas patient states that she was advised by Dr. Izora Ribas does not do digital sympathectomy. Patient states Dr. Izora Ribas does feel that she would benefit from a digital sympathectomy. Patient states she was not given any ideas of who would be able to perform the surgery. Please advise.

## 2018-11-06 NOTE — Telephone Encounter (Signed)
Patient advised Dr. Corliss Skains has reached out to another physician and is awaiting response.

## 2018-11-06 NOTE — Telephone Encounter (Signed)
I have sent a message to Dr. Sherald Hess.  I am awaiting his response.

## 2018-11-06 NOTE — Telephone Encounter (Signed)
Patient called stating she had appointment with Dr. Izora Ribas today.  Patient states Dr. Izora Ribas "doesn't do that type of surgery, but can help some."  Patient requested a return call.

## 2018-11-07 ENCOUNTER — Emergency Department (HOSPITAL_COMMUNITY): Payer: Medicare HMO

## 2018-11-07 ENCOUNTER — Encounter: Payer: Self-pay | Admitting: Rheumatology

## 2018-11-07 ENCOUNTER — Emergency Department (HOSPITAL_COMMUNITY)
Admission: EM | Admit: 2018-11-07 | Discharge: 2018-11-07 | Disposition: A | Discharge status: Home / Self Care | Payer: Medicare HMO | Attending: Emergency Medicine | Admitting: Emergency Medicine

## 2018-11-07 ENCOUNTER — Encounter (HOSPITAL_COMMUNITY): Payer: Self-pay | Admitting: Emergency Medicine

## 2018-11-07 ENCOUNTER — Ambulatory Visit: Payer: Medicare HMO | Admitting: Rheumatology

## 2018-11-07 VITALS — BP 109/72 | HR 128 | Resp 15 | Ht 62.5 in | Wt 114.0 lb

## 2018-11-07 DIAGNOSIS — M7541 Impingement syndrome of right shoulder: Secondary | ICD-10-CM

## 2018-11-07 DIAGNOSIS — Z87891 Personal history of nicotine dependence: Secondary | ICD-10-CM | POA: Insufficient documentation

## 2018-11-07 DIAGNOSIS — R0789 Other chest pain: Secondary | ICD-10-CM

## 2018-11-07 DIAGNOSIS — N183 Chronic kidney disease, stage 3 (moderate): Secondary | ICD-10-CM | POA: Diagnosis not present

## 2018-11-07 DIAGNOSIS — Z79899 Other long term (current) drug therapy: Secondary | ICD-10-CM | POA: Diagnosis not present

## 2018-11-07 DIAGNOSIS — M7989 Other specified soft tissue disorders: Secondary | ICD-10-CM | POA: Diagnosis not present

## 2018-11-07 DIAGNOSIS — R296 Repeated falls: Secondary | ICD-10-CM

## 2018-11-07 DIAGNOSIS — M170 Bilateral primary osteoarthritis of knee: Secondary | ICD-10-CM | POA: Diagnosis not present

## 2018-11-07 DIAGNOSIS — I10 Essential (primary) hypertension: Secondary | ICD-10-CM

## 2018-11-07 DIAGNOSIS — R079 Chest pain, unspecified: Secondary | ICD-10-CM | POA: Diagnosis not present

## 2018-11-07 DIAGNOSIS — I129 Hypertensive chronic kidney disease with stage 1 through stage 4 chronic kidney disease, or unspecified chronic kidney disease: Secondary | ICD-10-CM | POA: Insufficient documentation

## 2018-11-07 DIAGNOSIS — I7301 Raynaud's syndrome with gangrene: Secondary | ICD-10-CM

## 2018-11-07 DIAGNOSIS — E782 Mixed hyperlipidemia: Secondary | ICD-10-CM

## 2018-11-07 DIAGNOSIS — M19071 Primary osteoarthritis, right ankle and foot: Secondary | ICD-10-CM | POA: Diagnosis not present

## 2018-11-07 DIAGNOSIS — M19072 Primary osteoarthritis, left ankle and foot: Secondary | ICD-10-CM

## 2018-11-07 DIAGNOSIS — M19041 Primary osteoarthritis, right hand: Secondary | ICD-10-CM

## 2018-11-07 DIAGNOSIS — L089 Local infection of the skin and subcutaneous tissue, unspecified: Secondary | ICD-10-CM

## 2018-11-07 DIAGNOSIS — R768 Other specified abnormal immunological findings in serum: Secondary | ICD-10-CM

## 2018-11-07 DIAGNOSIS — M19042 Primary osteoarthritis, left hand: Secondary | ICD-10-CM

## 2018-11-07 DIAGNOSIS — E1122 Type 2 diabetes mellitus with diabetic chronic kidney disease: Secondary | ICD-10-CM | POA: Insufficient documentation

## 2018-11-07 DIAGNOSIS — M5030 Other cervical disc degeneration, unspecified cervical region: Secondary | ICD-10-CM

## 2018-11-07 DIAGNOSIS — Z8639 Personal history of other endocrine, nutritional and metabolic disease: Secondary | ICD-10-CM

## 2018-11-07 DIAGNOSIS — D721 Eosinophilia, unspecified: Secondary | ICD-10-CM

## 2018-11-07 DIAGNOSIS — Z8659 Personal history of other mental and behavioral disorders: Secondary | ICD-10-CM

## 2018-11-07 DIAGNOSIS — B999 Unspecified infectious disease: Secondary | ICD-10-CM

## 2018-11-07 DIAGNOSIS — J449 Chronic obstructive pulmonary disease, unspecified: Secondary | ICD-10-CM | POA: Insufficient documentation

## 2018-11-07 DIAGNOSIS — M5136 Other intervertebral disc degeneration, lumbar region: Secondary | ICD-10-CM

## 2018-11-07 DIAGNOSIS — J479 Bronchiectasis, uncomplicated: Secondary | ICD-10-CM

## 2018-11-07 LAB — BASIC METABOLIC PANEL
Anion gap: 12 (ref 5–15)
BUN: 21 mg/dL (ref 8–23)
CO2: 25 mmol/L (ref 22–32)
Calcium: 8.7 mg/dL — ABNORMAL LOW (ref 8.9–10.3)
Chloride: 103 mmol/L (ref 98–111)
Creatinine, Ser: 1.76 mg/dL — ABNORMAL HIGH (ref 0.44–1.00)
GFR calc Af Amer: 34 mL/min — ABNORMAL LOW (ref >60)
GFR calc non Af Amer: 30 mL/min — ABNORMAL LOW (ref >60)
Glucose, Bld: 91 mg/dL (ref 70–99)
Potassium: 3.8 mmol/L (ref 3.5–5.1)
Sodium: 140 mmol/L (ref 135–145)

## 2018-11-07 LAB — CBC
HCT: 36.5 % (ref 36.0–46.0)
Hemoglobin: 11.3 g/dL — ABNORMAL LOW (ref 12.0–15.0)
MCH: 28.3 pg (ref 26.0–34.0)
MCHC: 31 g/dL (ref 30.0–36.0)
MCV: 91.3 fL (ref 80.0–100.0)
Platelets: 477 10*3/uL — ABNORMAL HIGH (ref 150–400)
RBC: 4 MIL/uL (ref 3.87–5.11)
RDW: 16.9 % — ABNORMAL HIGH (ref 11.5–15.5)
WBC: 15.3 10*3/uL — ABNORMAL HIGH (ref 4.0–10.5)
nRBC: 0 % (ref 0.0–0.2)

## 2018-11-07 LAB — I-STAT TROPONIN, ED
Troponin i, poc: 0.02 ng/mL (ref 0.00–0.08)
Troponin i, poc: 0.01 ng/mL (ref 0.00–0.08)

## 2018-11-07 LAB — I-STAT CG4 LACTIC ACID, ED: Lactic Acid, Venous: 1.51 mmol/L (ref 0.5–1.9)

## 2018-11-07 MED ORDER — OXYCODONE-ACETAMINOPHEN 5-325 MG PO TABS
1.0000 | ORAL_TABLET | Freq: Once | ORAL | Status: AC
  Administered 2018-11-07: 1 via ORAL
  Filled 2018-11-07: qty 1

## 2018-11-07 MED ORDER — SODIUM CHLORIDE 0.9% FLUSH
3.0000 mL | Freq: Once | INTRAVENOUS | Status: AC
  Administered 2018-11-07: 3 mL via INTRAVENOUS

## 2018-11-07 MED ORDER — ONDANSETRON HCL 4 MG/2ML IJ SOLN
4.0000 mg | Freq: Once | INTRAMUSCULAR | Status: AC
  Administered 2018-11-07: 4 mg via INTRAVENOUS
  Filled 2018-11-07: qty 2

## 2018-11-07 MED ORDER — SULFAMETHOXAZOLE-TRIMETHOPRIM 800-160 MG PO TABS: 1.0000 | ORAL_TABLET | Freq: Two times a day (BID) | ORAL | 0 refills | Status: AC

## 2018-11-07 NOTE — ED Triage Notes (Signed)
Pt being sent by rheumatoid Dr. with mid-sternal chest pain since last night. Denies shortness of breath.  Hx of raynaud's

## 2018-11-07 NOTE — Discharge Instructions (Addendum)
Take Bactrim as directed. Follow-up with Dr. Debby Bud office and your primary care provider for further evaluation. Dr. Izora Ribas requests that you do not apply any antibiotic or wet dressing to the area.  You will need to keep a dry dressing in the area. Return to ED for worsening symptoms, worsening chest pain, shortness of breath, fever, discoloration.  The

## 2018-11-07 NOTE — ED Provider Notes (Signed)
 [facility] EMERGENCY DEPARTMENT Provider Note   CSN: [id number] Arrival date & time: 11/07/18  1216     History   Chief Complaint Chief Complaint  Patient presents with  . Chest Pain    HPI Desiree [name] is a 67 y.o. female with a past medical history of hypertension, hyperlipidemia, diabetes, CKD, ray nods, who presents to ED for evaluation of multiple complaints. Her first complaint is right third digit fingertip discoloration, purulent drainage that worsened this morning.  She was seen and evaluated by her hand specialist Dr. Coley yesterday.  He recommended that patient apply over-the-counter ointment in the area. States that she woke up this morning and noted " green stuff" and discoloration of her fingertip.  She was sent to the ED by her rheumatologist, who was concerned about gangrene with infection.  Husband at bedside states that patient had similar symptoms in her left middle digit fingertip which required amputation by Dr. Coley approximately 1 month ago.  She also complains of chest pain.  States that the chest pain has been going on constantly for the past 24 hours.  She reports some shortness of breath but has not used her nebulizer or albuterol inhaler recently.  No history of similar symptoms in the past.  She denies any fever, abdominal pain, vomiting, URI symptoms, prior MI, DVT, PE or recent immobilization. Patient states that "they said I am here for chest pain but it is mostly from my finger."  HPI  Past Medical History:  Diagnosis Date  . Anxiety   . Bipolar disorder (HCC)   . CANDIDIASIS, ESOPHAGEAL 07/28/2009   Qualifier: Diagnosis of  By: [name] MD, Wanda K   . Chronic kidney disease    CKD III  . COPD (chronic obstructive pulmonary disease) (HCC)   . Depression   . Diabetes mellitus without complication (HCC)    no meds  . FH: colonic polyps   . Fractured elbow    right   . GERD (gastroesophageal reflux disease)   . HH (hiatus  hernia)   . History of carpal tunnel syndrome   . History of chest pain   . History of transfusion of packed red blood cells   . Hyperlipidemia   . Hypertension   . Neuroleptic-induced tardive dyskinesia   . Osteoarthritis of more than one site   . Seasonal allergies     Patient Active Problem List   Diagnosis Date Noted  . History of fall 11/05/2018  . Weight loss 11/05/2018  . Weakness 11/05/2018  . Gangrene of finger (HCC) 10/09/2018  . Raynaud's phenomenon with gangrene (HCC) 10/09/2018  . LVH (left ventricular hypertrophy) 10/09/2018  . Vasospasm (HCC) 09/06/2018  . Hypotension 08/08/2018  . Leucocytosis 08/08/2018  . Acute gastritis without bleeding 08/08/2018  . Elevated troponin I level 08/08/2018  . Nausea and vomiting 08/06/2018  . Pneumonia 07/12/2018  . COPD exacerbation (HCC) 07/11/2018  . Primary osteoarthritis of both feet 12/21/2017  . DDD (degenerative disc disease), lumbar 12/21/2017  . Primary osteoarthritis of both knees 12/21/2017  . History of bilateral carpal tunnel release 12/21/2017  . DDD (degenerative disc disease), cervical 11/15/2017  . Former smoker 11/15/2017  . Bronchiectasis without complication (HCC) 10/25/2017  . Impingement syndrome of right shoulder 07/25/2017  . Hx of fusion of cervical spine 07/25/2017  . Cervical spinal stenosis 09/27/2016  . Closed displaced fracture of proximal phalanx of lesser toe of right foot 09/08/2016  . Spinal stenosis of cervical region 09/08/2016  .   Polypharmacy 06/23/2015  . Hyperlipidemia 04/19/2015  . Visit for preventive health examination 01/01/2015  . Primary osteoarthritis involving multiple joints 01/01/2015  . Bipolar affective disorder, currently depressed, moderate (HCC)   . Bipolar I disorder with mania (HCC) 08/17/2014  . Foot cramps 07/03/2014  . Hypertension 10/21/2013  . Ear fullness 10/21/2013  . Dizziness 03/25/2013  . Medication withdrawal (HCC) 03/05/2013  . Diabetes mellitus with  renal manifestations, controlled (HCC) 11/10/2012  . Alopecia areata 02/06/2012  . Obesity (BMI 30-39.9) 02/06/2012  . Renal insufficiency 07/16/2011  . Neuroleptic-induced tardive dyskinesia   . Exertional dyspnea 02/07/2011  . Dyspnea on exertion 01/19/2011  . DM (diabetes mellitus), type 2 (HCC) 04/22/2010  . Carpal tunnel syndrome 02/02/2010  . CONSTIPATION 02/02/2010  . Primary osteoarthritis of both hands 02/02/2010  . CATARACTS 04/13/2009  . PAIN IN JOINT, ANKLE AND FOOT 09/16/2008  . Eosinophilia 07/21/2008  . AFFECTIVE DISORDER 04/21/2008  . BACK PAIN, CHRONIC 02/12/2008  . LEG PAIN 02/12/2008  . ABNORMAL INVOLUNTARY MOVEMENTS 12/04/2007  . POSTURAL LIGHTHEADEDNESS 11/04/2007  . COLONIC POLYPS, HX OF 11/04/2007  . Essential hypertension 06/17/2007  . HYPERLIPIDEMIA 04/08/2007  . MITRAL VALVE PROLAPSE 04/08/2007  . GERD 04/08/2007  . LOW BACK PAIN SYNDROME 04/08/2007  . CHEST PAIN, RECURRENT 04/08/2007    Past Surgical History:  Procedure Laterality Date  . AMPUTATION Left 10/14/2018   Procedure: AMPUTATION LEFT LONG FINGER TIP;  Surgeon: Coley, Harrill, MD;  Location: [facility] OR;  Service: Plastics;  Laterality: Left;  . ANTERIOR CERVICAL DECOMP/DISCECTOMY FUSION  09/27/2016   C5-6 anterior cervical discectomy and fusion, allograft and plate/notes 09/27/2016  . ANTERIOR CERVICAL DECOMP/DISCECTOMY FUSION N/A 09/27/2016   Procedure: C5-6 Anterior Cervical Discectomy and Fusion, Allograft and Plate;  Surgeon: Mark C Yates, MD;  Location: [facility] OR;  Service: Orthopedics;  Laterality: N/A;  . Back Fusion  2002  . BIOPSY  07/19/2018   Procedure: BIOPSY;  Surgeon: Hung, Patrick, MD;  Location: [facility] ENDOSCOPY;  Service: Endoscopy;;  . CARPAL TUNNEL RELEASE  yates   left  . COLONOSCOPY N/A 01/05/2014   Procedure: COLONOSCOPY;  Surgeon: Jyothi Mann, MD;  Location: [facility] ENDOSCOPY;  Service: Endoscopy;  Laterality: N/A;  . ELBOW SURGERY     age 7  . ESOPHAGOGASTRODUODENOSCOPY (EGD) WITH  PROPOFOL N/A 07/19/2018   Procedure: ESOPHAGOGASTRODUODENOSCOPY (EGD) WITH PROPOFOL;  Surgeon: Hung, Patrick, MD;  Location: [facility] ENDOSCOPY;  Service: Endoscopy;  Laterality: N/A;  . EXTERNAL EAR SURGERY Left   . EYE SURGERY     "removed white dots under eyelid"  . FINGER SURGERY Left   . Juvara osteomy    . KNEE SURGERY    . NOSE SURGERY    . Rt. toe bunion    . skin, shave biopsy  05/03/2016   Left occipital scalp, top of scalp  . UPPER EXTREMITY ANGIOGRAPHY Bilateral 10/11/2018   Procedure: UPPER EXTREMITY ANGIOGRAPHY;  Surgeon: Fields, Charles E, MD;  Location: [facility] INVASIVE CV LAB;  Service: Cardiovascular;  Laterality: Bilateral;     OB History   No obstetric history on file.      Home Medications    Prior to Admission medications   Medication Sig Start Date End Date Taking? Authorizing Provider  acetaminophen (TYLENOL) 500 MG tablet Take 2 tablets (1,000 mg total) by mouth every 6 (six) hours as needed. Patient taking differently: Take 1,000 mg by mouth every 6 (six) hours as needed for mild pain or headache.  09/13/15  Yes Pfeiffer, Marcy, MD  amLODipine (NORVASC) 10   MG tablet Take 1 tablet (10 mg total) by mouth daily. 10/16/18 11/15/18 Yes Choi, Jennifer, DO  ANORO ELLIPTA 62.5-25 MCG/INH AEPB INHALE 1 PUFF ONCE DAILY Patient taking differently: Inhale 1 puff into the lungs daily.  11/06/18  Yes [name], MD  aspirin EC 81 MG tablet Take by mouth. 10/25/18  Yes [provider]  atorvastatin (LIPITOR) 20 MG tablet TAKE 1 TABLET BY MOUTH ONCE DAILY Patient taking differently: Take 20 mg by mouth daily.  06/27/18  Yes [name], [name], MD  cetirizine (ZYRTEC) 10 MG tablet Take 10 mg by mouth at bedtime.    Yes [provider]  diclofenac sodium (VOLTAREN) 1 % GEL Apply 3 grams to 3 large joints, up to 3 times daily. Patient taking differently: Apply 3 g topically See admin instructions. Apply 3 grams to three large joints, up to three times a day 01/29/18  Yes  Deveshwar, Abel Presto, MD  docusate sodium (COLACE) 100 MG capsule Take 100 mg by mouth at bedtime.    Yes [provider]  DULoxetine (CYMBALTA) 30 MG capsule TAKE 3 CAPSULES BY MOUTH EVERY DAY Patient taking differently: Take 90 mg by mouth daily.  11/04/18  Yes Shugart, Lissa Hoard, PA-C  feeding supplement, ENSURE ENLIVE, (ENSURE ENLIVE) LIQD Take 237 mLs by mouth 3 (three) times daily between meals. Patient taking differently: Take 237 mLs by mouth 2 (two) times daily after a meal.  07/20/18  Yes [name], MD  fluticasone (FLONASE) 50 MCG/ACT nasal spray Place 1-2 sprays into both nostrils at bedtime.    Yes [provider]  guaiFENesin (MUCINEX) 600 MG 12 hr tablet Take 600 mg by mouth at bedtime.   Yes [provider]  hydroxychloroquine (PLAQUENIL) 200 MG tablet TAKE 1 TABLET BY MOUTH TWICE DAILY, MONDAY-FRIDAY. NONE ON SATURDAY OR SUNDAY Patient taking differently: Take 200 mg by mouth 2 (two) times daily. Monday through Friday, take none on Saturday or Sunday. 10/25/18  Yes Deveshwar, Abel Presto, MD  ipratropium-albuterol (DUONEB) 0.5-2.5 (3) MG/3ML SOLN INHALE CONTENTS OF 1 VIAL THREE TIMES A DAY IN NEBULIZER Patient taking differently: Take 3 mLs by nebulization 3 (three) times daily.  05/06/18  Yes [name], MD  lamoTRIgine (LAMICTAL) 25 MG tablet Take 1 tablet (25 mg total) by mouth 2 (two) times daily. 09/09/18  Yes Shugart, Lissa Hoard, PA-C  linaclotide (LINZESS) 145 MCG CAPS capsule Take 145 mcg by mouth daily as needed (constipation).   Yes [provider]  Multiple Vitamins-Minerals (CENTRUM SILVER 50+WOMEN) TABS Take 1 tablet by mouth daily.   Yes [provider]  oxyCODONE (OXY IR/ROXICODONE) 5 MG immediate release tablet Take 5 mg by mouth daily.  10/18/18  Yes [provider]  Probiotic Product (ALIGN) 4 MG CAPS Take 4 mg by mouth daily.   Yes [provider]  Blood Glucose Monitoring Suppl (ACCU-CHEK NANO SMARTVIEW)  W/DEVICE KIT Use to check blood sugar 1-2 times daily Patient taking differently: 1 strip by Other route 2 (two) times a week.  11/26/14   [name], [name], MD  glucose blood test strip Accu-chek nano, test glucose once daily, dx E11.9 09/06/18   [name], [name], MD  Lancets Misc. (ACCU-CHEK FASTCLIX LANCET) KIT USE TO CHECK BLOOD SUGAR 1-2 TIMES DAILY Dx E11.9 09/06/18   [name], [name], MD  losartan (COZAAR) 50 MG tablet  10/04/18   [provider]  montelukast (SINGULAIR) 10 MG tablet TAKE 1 TABLET BY MOUTH AT BEDTIME Patient taking differently: Take 10  mg by mouth at bedtime.  08/19/18   McQuaid, Douglas B, MD  nitroGLYCERIN (NITRO-BID) 2 % ointment APPLY 1/4 INCH TOPICALLY TO AFFECTED FINGERTIPS THREE TIMES DAILY 10/18/18   Deveshwar, Shaili, MD  NON FORMULARY Respivest: Three times a day for 30 minutes    [provider]  ondansetron (ZOFRAN-ODT) 4 MG disintegrating tablet DISSOLVE 1 TABLET(4 MG) ON THE TONGUE EVERY 8 HOURS AS NEEDED FOR NAUSEA OR VOMITING Patient taking differently: Take 4 mg by mouth every 8 (eight) hours as needed for nausea or vomiting.  08/13/18   [name], Wanda K, MD  OVER THE COUNTER MEDICATION Take 1 capsule by mouth See admin instructions. Fdgard capsules: Take 1 capsule by mouth once a day    [provider]  pantoprazole (PROTONIX) 40 MG tablet Take 40 mg by mouth daily.    [provider]  Respiratory Therapy Supplies (FLUTTER) DEVI 1 Device by Does not apply route as directed. Patient taking differently: 1 Device as directed.  10/24/17   Parrett, Tammy S, NP  sodium chloride HYPERTONIC 3 % nebulizer solution Take by nebulization 2 (two) times daily. Patient taking differently: Take 4 mLs by nebulization 2 (two) times daily.  01/18/18   McQuaid, Douglas B, MD  sulfamethoxazole-trimethoprim (BACTRIM DS,SEPTRA DS) 800-160 MG tablet Take 1 tablet by mouth 2 (two) times daily for 7 days. 11/07/18 11/14/18  , , PA-C  triamcinolone  cream (KENALOG) 0.1 % Apply 1 application topically daily as needed (for itching of affected areas).  09/26/18   [provider]    Family History Family History  Problem Relation Age of Onset  . Heart attack Father   . Heart disease Father   . Throat cancer Brother   . Diabetes Mother   . Hypertension Mother   . Heart attack Mother   . Diabetes type II Brother   . Heart disease Brother   . Lung cancer Brother   . Breast cancer Cousin   . Lung cancer Daughter   . Lung cancer Paternal Uncle     Social History Social History   Tobacco Use  . Smoking status: Former Smoker    Packs/day: 2.00    Years: 30.00    Pack years: 60.00    Types: Cigarettes    Last attempt to quit: 10/23/2001    Years since quitting: 17.0  . Smokeless tobacco: Never Used  Substance Use Topics  . Alcohol use: No  . Drug use: No     Allergies   Prednisone; Solu-medrol [methylprednisolone]; Amoxicillin; Codeine; Hydrocodone-acetaminophen; Penicillins; and Tape   Review of Systems Review of Systems  Constitutional: Negative for appetite change, chills and fever.  HENT: Negative for ear pain, rhinorrhea, sneezing and sore throat.   Eyes: Negative for photophobia and visual disturbance.  Respiratory: Positive for shortness of breath. Negative for cough, chest tightness and wheezing.   Cardiovascular: Positive for chest pain. Negative for palpitations.  Gastrointestinal: Negative for abdominal pain, blood in stool, constipation, diarrhea, nausea and vomiting.  Genitourinary: Negative for dysuria, hematuria and urgency.  Musculoskeletal: Negative for myalgias.  Skin: Positive for wound. Negative for rash.  Neurological: Negative for dizziness, weakness and light-headedness.     Physical Exam Updated Vital Signs BP (!) 144/89 (BP Location: Right Arm)   Pulse 64   Temp 98.1 F (36.7 C) (Oral)   Resp 16   SpO2 96%   Physical Exam Vitals signs and nursing note reviewed.    Constitutional:      General: She   is not in acute distress.    Appearance: She is well-developed.  HENT:     Head: Normocephalic and atraumatic.     Nose: Nose normal.  Eyes:     General: No scleral icterus.       Left eye: No discharge.     Conjunctiva/sclera: Conjunctivae normal.  Neck:     Musculoskeletal: Normal range of motion and neck supple.  Cardiovascular:     Rate and Rhythm: Normal rate and regular rhythm.     Heart sounds: Normal heart sounds. No murmur. No friction rub. No gallop.   Pulmonary:     Effort: Pulmonary effort is normal. No respiratory distress.     Breath sounds: Normal breath sounds.  Abdominal:     General: Bowel sounds are normal. There is no distension.     Palpations: Abdomen is soft.     Tenderness: There is no abdominal tenderness. There is no guarding.  Musculoskeletal: Normal range of motion.  Skin:    General: Skin is warm and dry.     Capillary Refill: Capillary refill takes 2 to 3 seconds.     Findings: Erythema and wound present. No rash.     Comments: Discoloration noted over the right third digit fingertip.  Small amount of purulent drainage laterally.  Neurological:     Mental Status: She is alert.     Motor: No abnormal muscle tone.     Coordination: Coordination normal.          ED Treatments / Results  Labs (all labs ordered are listed, but only abnormal results are displayed) Labs Reviewed  BASIC METABOLIC PANEL - Abnormal; Notable for the following components:      Result Value   Creatinine, Ser 1.76 (*)    Calcium 8.7 (*)    GFR calc non Af Amer 30 (*)    GFR calc Af Amer 34 (*)    All other components within normal limits  CBC - Abnormal; Notable for the following components:   WBC 15.3 (*)    Hemoglobin 11.3 (*)    RDW 16.9 (*)    Platelets 477 (*)    All other components within normal limits  CULTURE, BLOOD (ROUTINE X 2)  CULTURE, BLOOD (ROUTINE X 2)  I-STAT TROPONIN, ED  I-STAT CG4 LACTIC ACID, ED   I-STAT TROPONIN, ED  I-STAT CG4 LACTIC ACID, ED    EKG EKG Interpretation  Date/Time:  Thursday November 07 2018 12:31:01 EST Ventricular Rate:  98 PR Interval:  130 QRS Duration: 88 QT Interval:  380 QTC Calculation: 485 R Axis:   39 Text Interpretation:  Normal sinus rhythm Septal infarct , age undetermined Abnormal ECG When compared to prior, no significnant changes seen.  No STEMI Confirmed by Antony Blackbird [id number]) on 11/07/2018 3:33:27 PM   Radiology Dg Chest 2 View  Result Date: 11/07/2018 CLINICAL DATA:  Chest pain and nausea.  Hypertension. EXAM: CHEST - 2 VIEW COMPARISON:  Chest radiograph August 06, 2018 and chest CT October 09, 2018 FINDINGS: There is no appreciable edema or consolidation. The heart size and pulmonary vascularity are normal. No adenopathy. There is postoperative change in the lower cervical region. There is degenerative change in the lower thoracic spine. IMPRESSION: No edema or consolidation. Electronically Signed   By: Lowella Grip III M.D.   On: 11/07/2018 13:52   Dg Finger Middle Right  Result Date: 11/07/2018 CLINICAL DATA:  Possible necrosis involving the tuft of the RIGHT long finger. EXAM: RIGHT MIDDLE  FINGER 2+V COMPARISON:  None. FINDINGS: Mild soft tissue swelling overlying the DIP joint. No evidence of acute, subacute or healed fractures. No evidence of osteomyelitis. Mild narrowing of the DIP and PIP joint spaces. MCP joint space well-preserved. Bone mineral density well-preserved. IMPRESSION: No acute or subacute osseous abnormality. No evidence of osteomyelitis. Mild osteoarthritis involving the DIP and PIP joints. Electronically Signed   By: Thomas  Lawrence M.D.   On: 11/07/2018 15:48    Procedures Procedures (including critical care time)  Medications Ordered in ED Medications  sodium chloride flush (NS) 0.9 % injection 3 mL (3 mLs Intravenous Given 11/07/18 1741)  oxyCODONE-acetaminophen (PERCOCET/ROXICET) 5-325 MG per tablet 1  tablet (1 tablet Oral Given 11/07/18 1955)  ondansetron (ZOFRAN) injection 4 mg (4 mg Intravenous Given 11/07/18 1905)     Initial Impression / Assessment and Plan / ED Course  I have reviewed the triage vital signs and the nursing notes.  Pertinent labs & imaging results that were available during my care of the patient were reviewed by me and considered in my medical decision making (see chart for details).  Clinical Course as of Nov 07 2049  Thu Nov 07, 2018  1540 67-year-old female presents to ED for multiple complaints.  She notes gradually improving chest pain since last night.  She was mainly sent here by her rheumatologist for her right third digit fingertip infection.  She was evaluated by her hand specialist who completed her left digit amputation last month.  She has a history of ray nods.  States that she had some purulent drainage yesterday as well.  Dr. Coley did advise her to follow-up in 2 weeks.  Physical exam findings as noted above.  Sensation intact to light touch.  Chest pain is reproducible to palpation.  Vital signs are within normal limits.  Will obtain lab, imaging and reassess.   [HK]  1552 Spoke to Dr. Coley on the phone.  He viewed the images, recommends that patient discontinue all ointments, use a dry dressing and begin oral antibiotics.  States that she can follow-up in the office next week.   [HK]  1600 Lactic acid normal.  She does have leukocytosis of 15, which is slightly lower than prior readings. Mild elevation in creatinine to 1.76. She is afebrile. Blood cultures collected.   [HK]  1856 Patient nauseous, having diarrhea.  States that this happens to her for which she takes Zofran for.  She is currently being worked up by GI specialist.  States that this feels similar to her prior flareups.  Will give IV Zofran.   [HK]  1929 Patient now resting comfortably.  Tolerating p.o. intake without difficulty.  She further states that she will have similar nausea,  vomiting and diarrhea just about every morning or every night.   [HK]  2048 She is low risk by heart score. Last cardiologist note states she is able to f/u in 1 year, no changes to medications. Her chest pain is reproducible and she has had 2 negative troponins.  EKG shows sinus rhythm with no changes from prior tracings.  She remains in NAD. Will have her follow up with cardiologist, Dr. Coley and PCP.  Discharge home with Bactrim.   [HK]    Clinical Course User Index [HK] , , PA-C   Patient discussed with and seen by my attending, Dr. Tegeler.  Patient is hemodynamically stable, in NAD, and able to ambulate in the ED. Evaluation does not show pathology that would require ongoing emergent   intervention or inpatient treatment. I explained the diagnosis to the patient. Pain has been managed and has no complaints prior to discharge. Patient is comfortable with above plan and is stable for discharge at this time. All questions were answered prior to disposition. Strict return precautions for returning to the ED were discussed. Encouraged follow up with PCP.    Portions of this note were generated with Dragon dictation software. Dictation errors may occur despite best attempts at proofreading.  Final Clinical Impressions(s) / ED Diagnoses   Final diagnoses:  Finger infection  Chest wall pain    ED Discharge Orders         Ordered    sulfamethoxazole-trimethoprim (BACTRIM DS,SEPTRA DS) 800-160 MG tablet  2 times daily     11/07/18 2045           , , PA-C 11/07/18 2051    Tegeler, Christopher J, MD 11/07/18 2346  

## 2018-11-07 NOTE — ED Notes (Signed)
Pt actively vomiting, see MAR

## 2018-11-08 ENCOUNTER — Ambulatory Visit: Payer: Medicare HMO | Admitting: Psychiatry

## 2018-11-08 ENCOUNTER — Other Ambulatory Visit: Payer: Self-pay | Admitting: Psychiatry

## 2018-11-08 DIAGNOSIS — F329 Major depressive disorder, single episode, unspecified: Secondary | ICD-10-CM | POA: Diagnosis not present

## 2018-11-08 DIAGNOSIS — F411 Generalized anxiety disorder: Secondary | ICD-10-CM | POA: Diagnosis not present

## 2018-11-08 DIAGNOSIS — F32A Depression, unspecified: Secondary | ICD-10-CM

## 2018-11-08 MED ORDER — LAMOTRIGINE 25 MG PO TABS: 25.0000 mg | ORAL_TABLET | Freq: Three times a day (TID) | ORAL | 1 refills | Status: DC

## 2018-11-08 MED ORDER — DULOXETINE HCL 60 MG PO CPEP: 60.0000 mg | ORAL_CAPSULE | Freq: Every day | ORAL | 3 refills | Status: DC
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
## 2018-11-08 NOTE — Progress Notes (Signed)
[facility]  [name],  MRN: [id number]  PCP: [name], MD  Date of Evaluation: 11/08/2018 Time spent:20 minutes  Chief Complaint:   HISTORY/CURRENT STATUS: HPI patient seen 1119.  Treated for depression and anxiety. She is had worsening of depression.  Anxiety is slightly better.  She does have a past diagnosis of bipolar disorder and psychosis. She continues to have GI issues which she has had for a while.  There is also a question of seizure disorder, history of serotonin syndrome, and increased QTC.  At her visit in November we increased her Cymbalta to 90 mg a day.  She is also having problems with Raynaud's phenomena in her hands.  Individual Medical History/ Review of Systems: Changes? :No   Allergies: Prednisone; Solu-medrol [methylprednisolone]; Amoxicillin; Codeine; Hydrocodone-acetaminophen; Penicillins; and Tape  Current Medications:  Current Outpatient Medications:  .  acetaminophen (TYLENOL) 500 MG tablet, Take 2 tablets (1,000 mg total) by mouth every 6 (six) hours as needed. (Patient taking differently: Take 1,000 mg by mouth every 6 (six) hours as needed for mild pain or headache. ), Disp: 30 tablet, Rfl: 0 .  amLODipine (NORVASC) 10 MG tablet, Take 1 tablet (10 mg total) by mouth daily., Disp: 30 tablet, Rfl: 0 .  ANORO ELLIPTA 62.5-25 MCG/INH AEPB, INHALE 1 PUFF ONCE DAILY (Patient taking differently: Inhale 1 puff into the lungs daily. ), Disp: 60 each, Rfl: 5 .  aspirin EC 81 MG tablet, Take by mouth., Disp: , Rfl:  .  atorvastatin (LIPITOR) 20 MG tablet, TAKE 1 TABLET BY MOUTH ONCE DAILY (Patient taking differently: Take 20 mg by mouth daily. ), Disp: 90 tablet, Rfl: 0 .  Blood Glucose Monitoring Suppl (ACCU-CHEK NANO SMARTVIEW) W/DEVICE KIT, Use to check blood sugar 1-2 times daily (Patient taking differently: 1 strip by Other route 2 (two) times a week. ), Disp: 1 kit, Rfl: 0 .  cetirizine (ZYRTEC) 10 MG tablet, Take 10 mg by  mouth at bedtime. , Disp: , Rfl:  .  diclofenac sodium (VOLTAREN) 1 % GEL, Apply 3 grams to 3 large joints, up to 3 times daily. (Patient taking differently: Apply 3 g topically See admin instructions. Apply 3 grams to three large joints, up to three times a day), Disp: 3 Tube, Rfl: 3 .  docusate sodium (COLACE) 100 MG capsule, Take 100 mg by mouth at bedtime. , Disp: , Rfl:  .  DULoxetine (CYMBALTA) 30 MG capsule, TAKE 3 CAPSULES BY MOUTH EVERY DAY (Patient taking differently: Take 90 mg by mouth daily. ), Disp: 90 capsule, Rfl: 1 .  feeding supplement, ENSURE ENLIVE, (ENSURE ENLIVE) LIQD, Take 237 mLs by mouth 3 (three) times daily between meals. (Patient taking differently: Take 237 mLs by mouth 2 (two) times daily after a meal. ), Disp: 14 Bottle, Rfl: 0 .  fluticasone (FLONASE) 50 MCG/ACT nasal spray, Place 1-2 sprays into both nostrils at bedtime. , Disp: , Rfl:  .  glucose blood test strip, Accu-chek nano, test glucose once daily, dx E11.9, Disp: 100 each, Rfl: 12 .  guaiFENesin (MUCINEX) 600 MG 12 hr tablet, Take 600 mg by mouth at bedtime., Disp: , Rfl:  .  hydroxychloroquine (PLAQUENIL) 200 MG tablet, TAKE 1 TABLET BY MOUTH TWICE DAILY, MONDAY-FRIDAY. NONE ON SATURDAY OR SUNDAY (Patient taking differently: Take 200 mg by mouth 2 (two) times daily. Monday through Friday, take none on Saturday or Sunday.), Disp: 40 tablet, Rfl: 0 .  ipratropium-albuterol (DUONEB) 0.5-2.5 (3) MG/3ML SOLN, INHALE  CONTENTS OF 1 VIAL THREE TIMES A DAY IN NEBULIZER (Patient taking differently: Take 3 mLs by nebulization 3 (three) times daily. ), Disp: 1080 mL, Rfl: 4 .  lamoTRIgine (LAMICTAL) 25 MG tablet, Take 1 tablet (25 mg total) by mouth 2 (two) times daily., Disp: 60 tablet, Rfl: 1 .  Lancets Misc. (ACCU-CHEK FASTCLIX LANCET) KIT, USE TO CHECK BLOOD SUGAR 1-2 TIMES DAILY Dx E11.9, Disp: 1 kit, Rfl: 1 .  linaclotide (LINZESS) 145 MCG CAPS capsule, Take 145 mcg by mouth daily as needed (constipation)., Disp: ,  Rfl:  .  losartan (COZAAR) 50 MG tablet, Take 50 mg by mouth daily. , Disp: , Rfl:  .  montelukast (SINGULAIR) 10 MG tablet, TAKE 1 TABLET BY MOUTH AT BEDTIME (Patient taking differently: Take 10 mg by mouth at bedtime. ), Disp: 90 tablet, Rfl: 0 .  Multiple Vitamins-Minerals (CENTRUM SILVER 50+WOMEN) TABS, Take 1 tablet by mouth daily., Disp: , Rfl:  .  nitroGLYCERIN (NITRO-BID) 2 % ointment, APPLY 1/4 INCH TOPICALLY TO AFFECTED FINGERTIPS THREE TIMES DAILY (Patient taking differently: Apply 0.25 inches topically 3 (three) times daily. To affected fingertips), Disp: 30 g, Rfl: 0 .  NON FORMULARY, Respivest: Three times a day for 30 minutes, Disp: , Rfl:  .  ondansetron (ZOFRAN-ODT) 4 MG disintegrating tablet, DISSOLVE 1 TABLET(4 MG) ON THE TONGUE EVERY 8 HOURS AS NEEDED FOR NAUSEA OR VOMITING (Patient taking differently: Take 4 mg by mouth every 8 (eight) hours as needed for nausea or vomiting. ), Disp: 20 tablet, Rfl: 0 .  OVER THE COUNTER MEDICATION, Take 1 capsule by mouth See admin instructions. Fdgard capsules: Take 1 capsule by mouth once a day, Disp: , Rfl:  .  oxyCODONE (OXY IR/ROXICODONE) 5 MG immediate release tablet, Take 5-10 mg by mouth every 6 (six) hours as needed for moderate pain. , Disp: , Rfl:  .  pantoprazole (PROTONIX) 40 MG tablet, Take 40 mg by mouth daily., Disp: , Rfl:  .  Probiotic Product (ALIGN) 4 MG CAPS, Take 4 mg by mouth daily., Disp: , Rfl:  .  Respiratory Therapy Supplies (FLUTTER) DEVI, 1 Device by Does not apply route as directed. (Patient taking differently: 1 Device as directed. ), Disp: 1 each, Rfl: 0 .  sodium chloride HYPERTONIC 3 % nebulizer solution, Take by nebulization 2 (two) times daily. (Patient taking differently: Take 4 mLs by nebulization 2 (two) times daily. ), Disp: 750 mL, Rfl: 5 .  sulfamethoxazole-trimethoprim (BACTRIM DS,SEPTRA DS) 800-160 MG tablet, Take 1 tablet by mouth 2 (two) times daily for 7 days., Disp: 14 tablet, Rfl: 0 .  triamcinolone  cream (KENALOG) 0.1 %, Apply 1 application topically daily as needed (for itching of affected areas). , Disp: , Rfl: 0 Medication Side Effects: none  Family Medical/ Social History: Changes? No  MENTAL HEALTH EXAM:  There were no vitals taken for this visit.There is no height or weight on file to calculate BMI.  General Appearance: Neat  Eye Contact:  Good  Speech:  Clear and Coherent  Volume:  Normal  Mood:  Euthymic  Affect:  Appropriate  Thought Process:  Linear  Orientation:  Full (Time, Place, and Person)  Thought Content: Logical   Suicidal Thoughts:  No  Homicidal Thoughts:  No  Memory:  WNL  Judgement:  Good  Insight:  Good  Psychomotor Activity:  Normal  Concentration:  Concentration: Good  Recall:  Good  Fund of Knowledge: Good  Language: Good  Assets:  Desire for Improvement  ADL's:  Intact  Cognition: WNL  Prognosis:  Good    DIAGNOSES: No diagnosis found.  Receiving Psychotherapy: No    RECOMMENDATIONS: We discussed coming off the Cymbalta and perhaps going on Zoloft as there is some concern that the Cymbalta could be causing her stomach issues.  However patient wants to stay on it just at a lower dose.  We are decreasing her Cymbalta to 60 mg a day. She is to increase her Lamictal from 50 to 75 mg a day. She is being followed by her physician for Raynaud's phenomenon. See her again in 6 weeks   Comer Locket, PA-C

## 2018-11-11 DIAGNOSIS — G8929 Other chronic pain: Secondary | ICD-10-CM | POA: Diagnosis not present

## 2018-11-11 DIAGNOSIS — M5030 Other cervical disc degeneration, unspecified cervical region: Secondary | ICD-10-CM | POA: Diagnosis not present

## 2018-11-11 DIAGNOSIS — M5136 Other intervertebral disc degeneration, lumbar region: Secondary | ICD-10-CM | POA: Diagnosis not present

## 2018-11-11 DIAGNOSIS — E1152 Type 2 diabetes mellitus with diabetic peripheral angiopathy with gangrene: Secondary | ICD-10-CM | POA: Diagnosis not present

## 2018-11-11 DIAGNOSIS — Z4781 Encounter for orthopedic aftercare following surgical amputation: Secondary | ICD-10-CM | POA: Diagnosis not present

## 2018-11-11 DIAGNOSIS — E1122 Type 2 diabetes mellitus with diabetic chronic kidney disease: Secondary | ICD-10-CM | POA: Diagnosis not present

## 2018-11-11 DIAGNOSIS — M4802 Spinal stenosis, cervical region: Secondary | ICD-10-CM | POA: Diagnosis not present

## 2018-11-11 DIAGNOSIS — I129 Hypertensive chronic kidney disease with stage 1 through stage 4 chronic kidney disease, or unspecified chronic kidney disease: Secondary | ICD-10-CM | POA: Diagnosis not present

## 2018-11-11 DIAGNOSIS — M159 Polyosteoarthritis, unspecified: Secondary | ICD-10-CM | POA: Diagnosis not present

## 2018-11-12 ENCOUNTER — Telehealth: Payer: Self-pay | Admitting: Internal Medicine

## 2018-11-12 LAB — CULTURE, BLOOD (ROUTINE X 2)
Culture: NO GROWTH
Culture: NO GROWTH
Special Requests: ADEQUATE

## 2018-11-12 NOTE — Telephone Encounter (Signed)
Copied from CRM [id number]. Topic: Quick Communication - Home Health Verbal Orders >> Nov 12, 2018 10:44 AM Debroah Loop wrote: Caller/Agency: Nicollete, LPN -[facility] Callback Number: [phone] Requesting OT/PT/Skilled Nursing/Social Work: PT Frequency: start of care 2x a wk for 2wks and 1x a wk for 2wks (starting yesterday)

## 2018-11-12 NOTE — Telephone Encounter (Signed)
Please advise Dr [name], thanks.   

## 2018-11-12 NOTE — Telephone Encounter (Signed)
yes

## 2018-11-13 ENCOUNTER — Telehealth: Payer: Self-pay | Admitting: Internal Medicine

## 2018-11-13 ENCOUNTER — Other Ambulatory Visit: Payer: Self-pay

## 2018-11-13 DIAGNOSIS — M159 Polyosteoarthritis, unspecified: Secondary | ICD-10-CM | POA: Diagnosis not present

## 2018-11-13 DIAGNOSIS — E1122 Type 2 diabetes mellitus with diabetic chronic kidney disease: Secondary | ICD-10-CM | POA: Diagnosis not present

## 2018-11-13 DIAGNOSIS — M5030 Other cervical disc degeneration, unspecified cervical region: Secondary | ICD-10-CM | POA: Diagnosis not present

## 2018-11-13 DIAGNOSIS — M4802 Spinal stenosis, cervical region: Secondary | ICD-10-CM | POA: Diagnosis not present

## 2018-11-13 DIAGNOSIS — I129 Hypertensive chronic kidney disease with stage 1 through stage 4 chronic kidney disease, or unspecified chronic kidney disease: Secondary | ICD-10-CM | POA: Diagnosis not present

## 2018-11-13 DIAGNOSIS — G8929 Other chronic pain: Secondary | ICD-10-CM | POA: Diagnosis not present

## 2018-11-13 DIAGNOSIS — Z4781 Encounter for orthopedic aftercare following surgical amputation: Secondary | ICD-10-CM | POA: Diagnosis not present

## 2018-11-13 DIAGNOSIS — E1152 Type 2 diabetes mellitus with diabetic peripheral angiopathy with gangrene: Secondary | ICD-10-CM | POA: Diagnosis not present

## 2018-11-13 DIAGNOSIS — M5136 Other intervertebral disc degeneration, lumbar region: Secondary | ICD-10-CM | POA: Diagnosis not present

## 2018-11-13 NOTE — Patient Outreach (Signed)
[facility]) Care Management  11/13/2018  ANALEESE ROUBIDEAUX [birth year] [id number]  Successful outreach to the patient on today's date, HIPAA identifiers confirmed. BSW inquired how the patient has been doing. Patient reports concerns surrounding a scab opening while bathing earlier today. The patient states her finger is currently bleeding from this incident. The patient confirms she has received resource information related to caregiver assistance.   The patient is still currently active with home health services. BSW discussed opportunity to request a home health aide to assist with bathing. The patient reports her physical therapist had just visited and planned to add this discipline. BSW encouraged the patient to follow up with the home health agency if she has not received an aide in the next 3-4 days. The patient stated understanding. BSW has also notified [facility] RNCM [facility] via voice message of this added discipline.   The patient states her niece has visited twice to assist with cleaning and household chores. The patient reports her niece "helps as she can" based on her own personal responsibilities. The patient continues to report concerns surrounding carrying a laundry basket to her back porch to access the dryer. BSW inquired if there were other friends or neighbors that may be able to provide support. The patient stated "there's a couple more people who can help. I need to ask them". BSW encouraged the patient to reach out to her support system regarding needs while her fingers heal. The patient reported understanding and intention of doing so.  The patient denies further needs from BSW during today's call. BSW will perform a discipline closure. BSW has reminded the patient she will remain active with other [facility] Care Management team members. The patient understands she may request further social work support if needed.  [name], BSW, CDP [facility] [phone]

## 2018-11-13 NOTE — Telephone Encounter (Signed)
Copied from CRM [id number]. Topic: Quick Communication - See Telephone Encounter >> Nov 13, 2018 11:26 AM Burchel, Abbi R wrote: CRM for notification. See Telephone encounter for: 11/13/18.  [facility]: [phone]) requesting v/o for prn nurse visit to look at wound on pt's rt middle finger.   *ok to leave vm

## 2018-11-13 NOTE — Telephone Encounter (Signed)
Please advise Dr [name], thanks.   

## 2018-11-13 NOTE — Telephone Encounter (Signed)
Ok

## 2018-11-14 ENCOUNTER — Ambulatory Visit: Payer: Medicare HMO | Admitting: Cardiovascular Disease

## 2018-11-14 DIAGNOSIS — R1011 Right upper quadrant pain: Secondary | ICD-10-CM | POA: Diagnosis not present

## 2018-11-14 DIAGNOSIS — K219 Gastro-esophageal reflux disease without esophagitis: Secondary | ICD-10-CM | POA: Diagnosis not present

## 2018-11-14 DIAGNOSIS — R634 Abnormal weight loss: Secondary | ICD-10-CM | POA: Diagnosis not present

## 2018-11-14 DIAGNOSIS — R112 Nausea with vomiting, unspecified: Secondary | ICD-10-CM | POA: Diagnosis not present

## 2018-11-15 ENCOUNTER — Inpatient Hospital Stay: Payer: Medicare HMO | Admitting: Internal Medicine

## 2018-11-15 ENCOUNTER — Other Ambulatory Visit: Payer: Self-pay | Admitting: Gastroenterology

## 2018-11-15 ENCOUNTER — Other Ambulatory Visit (HOSPITAL_COMMUNITY): Payer: Self-pay | Admitting: Gastroenterology

## 2018-11-15 ENCOUNTER — Telehealth: Payer: Self-pay | Admitting: Internal Medicine

## 2018-11-15 DIAGNOSIS — R1011 Right upper quadrant pain: Secondary | ICD-10-CM

## 2018-11-15 NOTE — Telephone Encounter (Signed)
Copied from CRM [id number]. Topic: Quick Communication - Home Health Verbal Orders >> Nov 15, 2018 10:23 AM Angela Nevin wrote: Caller/Agency: Nicole Cella Callback Number: [phone] Requesting OT: Frequency: 2x a week for 2 weeks   Requesting PT: Frequency: 2x a week for 2 weeks and 1x a week for 1week

## 2018-11-15 NOTE — Telephone Encounter (Signed)
Spoke with Nicholette, aware of VOs Nothing further needed.

## 2018-11-15 NOTE — Telephone Encounter (Signed)
See previous TE, VOs given.  Nothing further needed.

## 2018-11-15 NOTE — Telephone Encounter (Signed)
Left detailed message for Marchelle Folks, making aware that VOs okay per Dr Fabian Sharp for PRN nurse.

## 2018-11-18 ENCOUNTER — Other Ambulatory Visit: Payer: Self-pay | Admitting: Pulmonary Disease

## 2018-11-18 ENCOUNTER — Telehealth: Payer: Self-pay | Admitting: *Deleted

## 2018-11-18 NOTE — Telephone Encounter (Signed)
Copied from CRM [id number]. Topic: General - Other >> Nov 18, 2018 10:16 AM Lorrine Kin, NT wrote: Reason for CRM: Patient calling and states that she was advised this weekend to go to urgent care this weekend for her finger by Dr Rosezella [place] nurse. States that while she was on her way to the urgent care, Dr Debby Bud office called her and said they would send something in to the pharmacy for her finger.   Will send to Dr Fabian Sharp as Lorain Childes

## 2018-11-18 NOTE — Telephone Encounter (Addendum)
In the future  To expedite and give the   Best advice /care   She should first try to  contact the treating provider team or specialist   ie  Dr Izora Ribas  for her finger  Ulcers .     Glad he was able to add medication   And help

## 2018-11-18 NOTE — Progress Notes (Deleted)
Cardiology Office Note   Date:  11/18/2018   ID:  Desiree [name], DOB 19-Jul-1951, MRN [id number]  PCP:  [name], MD  Cardiologist:   [name], MD   No chief complaint on file.     History of Present Illness: 68 y.o. f/u chest pain. History of DM, previous smoker quit in 2002. Bipolar. Seen by rheumatology for Raynauds lab work negativ efor lupus anticoagulant , ANA and cardiolipin. Had normal myovue in 2016 TTE October 2019 with severe LVH EF 60-65%  December 2019 developed ulcers on hands/finger tips found to have right radial occlusion and left ulnar occlusion ? 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 Angiogram with no suitable distal bypass target She is to f/u with Dr [name] from Rheumatology   Has some muscle aches and pains over quads not clear if it is related to statin or RA Statin holiday given after last visit   Seen in ED 11/07/18 with multiple complaints. Purulent drainage from finger  Had constant chest pain for 24 hours Also had dyspnea right middle finger with gangrenous tip  SSCP reporducable and two negative troponins  Started on antibiotics and told to f/u with Dr Lenon Curt  *** Past Medical History:  Diagnosis Date  . Anxiety   . Bipolar disorder ([place])   . CANDIDIASIS, ESOPHAGEAL 07/28/2009   Qualifier: Diagnosis of  By: [name] MD, [name]   . Chronic kidney disease    CKD III  . COPD (chronic obstructive pulmonary disease) ([place])   . Depression   . Diabetes mellitus without complication (HCC)    no meds  . FH: colonic polyps   . Fractured elbow    right   . GERD (gastroesophageal reflux disease)   . HH (hiatus hernia)   . History of carpal tunnel syndrome   . History of chest pain   . History of transfusion of packed red blood cells   . Hyperlipidemia   . Hypertension   . Neuroleptic-induced tardive dyskinesia   . Osteoarthritis of more than one site   . Seasonal allergies     Past  Surgical History:  Procedure Laterality Date  . AMPUTATION Left 10/14/2018   Procedure: AMPUTATION LEFT LONG FINGER TIP;  Surgeon: Dayna Barker, MD;  Location: [place];  Service: Plastics;  Laterality: Left;  . ANTERIOR CERVICAL DECOMP/DISCECTOMY FUSION  09/27/2016   C5-6 anterior cervical discectomy and fusion, allograft and plate/notes 09/27/2016  . ANTERIOR CERVICAL DECOMP/DISCECTOMY FUSION N/A 09/27/2016   Procedure: C5-6 Anterior Cervical Discectomy and Fusion, Allograft and Plate;  Surgeon: [name], MD;  Location: [place];  Service: Orthopedics;  Laterality: N/A;  . Back Fusion  2002  . BIOPSY  07/19/2018   Procedure: BIOPSY;  Surgeon: [name], MD;  Location: [place];  Service: Endoscopy;;  . CARPAL TUNNEL RELEASE  yates   left  . COLONOSCOPY N/A 01/05/2014   Procedure: COLONOSCOPY;  Surgeon: [name], MD;  Location: [facility];  Service: Endoscopy;  Laterality: N/A;  . ELBOW SURGERY     age 44  . ESOPHAGOGASTRODUODENOSCOPY (EGD) WITH PROPOFOL N/A 07/19/2018   Procedure: ESOPHAGOGASTRODUODENOSCOPY (EGD) WITH PROPOFOL;  Surgeon: [name], MD;  Location: [place];  Service: Endoscopy;  Laterality: N/A;  . EXTERNAL EAR SURGERY Left   . EYE SURGERY     "removed white dots under eyelid"  . FINGER SURGERY Left   . Juvara osteomy    . KNEE  SURGERY    . NOSE SURGERY    . Rt. toe bunion    . skin, shave biopsy  05/03/2016   Left occipital scalp, top of scalp  . UPPER EXTREMITY ANGIOGRAPHY Bilateral 10/11/2018   Procedure: UPPER EXTREMITY ANGIOGRAPHY;  Surgeon: [name], MD;  Location: [place] [facility];  Service: Cardiovascular;  Laterality: Bilateral;     Current Outpatient Medications  Medication Sig Dispense Refill  . acetaminophen (TYLENOL) 500 MG tablet Take 2 tablets (1,000 mg total) by mouth every 6 (six) hours as needed. (Patient taking differently: Take 1,000 mg by mouth every 6 (six) hours as needed for mild pain or headache. ) 30 tablet 0    . amLODipine (NORVASC) 10 MG tablet Take 1 tablet (10 mg total) by mouth daily. 30 tablet 0  . ANORO ELLIPTA 62.5-25 MCG/INH AEPB INHALE 1 PUFF ONCE DAILY (Patient taking differently: Inhale 1 puff into the lungs daily. ) 60 each 5  . aspirin EC 81 MG tablet Take by mouth.    Marland Kitchen atorvastatin (LIPITOR) 20 MG tablet TAKE 1 TABLET BY MOUTH ONCE DAILY (Patient taking differently: Take 20 mg by mouth daily. ) 90 tablet 0  . Blood Glucose Monitoring Suppl (ACCU-CHEK NANO SMARTVIEW) W/DEVICE KIT Use to check blood sugar 1-2 times daily (Patient taking differently: 1 strip by Other route 2 (two) times a week. ) 1 kit 0  . cetirizine (ZYRTEC) 10 MG tablet Take 10 mg by mouth at bedtime.     . diclofenac sodium (VOLTAREN) 1 % GEL Apply 3 grams to 3 large joints, up to 3 times daily. (Patient taking differently: Apply 3 g topically See admin instructions. Apply 3 grams to three large joints, up to three times a day) 3 Tube 3  . docusate sodium (COLACE) 100 MG capsule Take 100 mg by mouth at bedtime.     . DULoxetine (CYMBALTA) 60 MG capsule Take 1 capsule (60 mg total) by mouth daily. 30 capsule 3  . feeding supplement, ENSURE ENLIVE, (ENSURE ENLIVE) LIQD Take 237 mLs by mouth 3 (three) times daily between meals. (Patient taking differently: Take 237 mLs by mouth 2 (two) times daily after a meal. ) 14 Bottle 0  . fluticasone (FLONASE) 50 MCG/ACT nasal spray Place 1-2 sprays into both nostrils at bedtime.     Marland Kitchen glucose blood test strip Accu-chek nano, test glucose once daily, dx E11.9 100 each 12  . guaiFENesin (MUCINEX) 600 MG 12 hr tablet Take 600 mg by mouth at bedtime.    . hydroxychloroquine (PLAQUENIL) 200 MG tablet TAKE 1 TABLET BY MOUTH TWICE DAILY, MONDAY-FRIDAY. NONE ON SATURDAY OR SUNDAY (Patient taking differently: Take 200 mg by mouth 2 (two) times daily. Monday through Friday, take none on Saturday or Sunday.) 40 tablet 0  . ipratropium-albuterol (DUONEB) 0.5-2.5 (3) MG/3ML SOLN INHALE CONTENTS OF 1  VIAL THREE TIMES A DAY IN NEBULIZER (Patient taking differently: Take 3 mLs by nebulization 3 (three) times daily. ) 1080 mL 4  . lamoTRIgine (LAMICTAL) 25 MG tablet Take 1 tablet (25 mg total) by mouth 3 (three) times daily. 90 tablet 1  . Lancets Misc. (ACCU-CHEK FASTCLIX LANCET) KIT USE TO CHECK BLOOD SUGAR 1-2 TIMES DAILY Dx E11.9 1 kit 1  . linaclotide (LINZESS) 145 MCG CAPS capsule Take 145 mcg by mouth daily as needed (constipation).    Marland Kitchen losartan (COZAAR) 50 MG tablet Take 50 mg by mouth daily.     . montelukast (SINGULAIR) 10 MG tablet TAKE 1  TABLET BY MOUTH AT BEDTIME (Patient taking differently: Take 10 mg by mouth at bedtime. ) 90 tablet 0  . Multiple Vitamins-Minerals (CENTRUM SILVER 50+WOMEN) TABS Take 1 tablet by mouth daily.    . nitroGLYCERIN (NITRO-BID) 2 % ointment APPLY 1/4 INCH TOPICALLY TO AFFECTED FINGERTIPS THREE TIMES DAILY (Patient taking differently: Apply 0.25 inches topically 3 (three) times daily. To affected fingertips) 30 g 0  . NON FORMULARY Respivest: Three times a day for 30 minutes    . ondansetron (ZOFRAN-ODT) 4 MG disintegrating tablet DISSOLVE 1 TABLET(4 MG) ON THE TONGUE EVERY 8 HOURS AS NEEDED FOR NAUSEA OR VOMITING (Patient taking differently: Take 4 mg by mouth every 8 (eight) hours as needed for nausea or vomiting. ) 20 tablet 0  . OVER THE COUNTER MEDICATION Take 1 capsule by mouth See admin instructions. Fdgard capsules: Take 1 capsule by mouth once a day    . oxyCODONE (OXY IR/ROXICODONE) 5 MG immediate release tablet Take 5-10 mg by mouth every 6 (six) hours as needed for moderate pain.     . pantoprazole (PROTONIX) 40 MG tablet Take 40 mg by mouth daily.    . Probiotic Product (ALIGN) 4 MG CAPS Take 4 mg by mouth daily.    Marland Kitchen Respiratory Therapy Supplies (FLUTTER) DEVI 1 Device by Does not apply route as directed. (Patient taking differently: 1 Device as directed. ) 1 each 0  . sodium chloride HYPERTONIC 3 % nebulizer solution Take by nebulization 2  (two) times daily. (Patient taking differently: Take 4 mLs by nebulization 2 (two) times daily. ) 750 mL 5  . triamcinolone cream (KENALOG) 0.1 % Apply 1 application topically daily as needed (for itching of affected areas).   0   No current facility-administered medications for this visit.     Allergies:   Prednisone; Solu-medrol [methylprednisolone]; Amoxicillin; Codeine; Hydrocodone-acetaminophen; Penicillins; and Tape    Social History:  The patient  reports that she quit smoking about 17 years ago. Her smoking use included cigarettes. She has a 60.00 pack-year smoking history. She has never used smokeless tobacco. She reports that she does not drink alcohol or use drugs.   Family History:  The patient's family history includes Breast cancer in her cousin; Diabetes in her mother; Diabetes type II in her brother; Heart attack in her father and mother; Heart disease in her brother and father; Hypertension in her mother; Lung cancer in her brother, daughter, and paternal uncle; Throat cancer in her brother.    ROS:  Please see the history of present illness.   Otherwise, review of systems are positive for none.   All other systems are reviewed and negative.    PHYSICAL EXAM: VS:  There were no vitals taken for this visit. , BMI There is no height or weight on file to calculate BMI. Affect appropriate Overweight black female  HEENT: normal Neck supple with no adenopathy JVP normal no bruits no thyromegaly Lungs clear with no wheezing and good diaphragmatic motion Heart:  S1/S2 no murmur, no rub, gallop or click PMI normal Abdomen: benighn, BS positve, no tenderness, no AAA no bruit.  No HSM or HJR Distal pulses intact with no bruits No edema Neuro non-focal Post amputation of left middle finger Raynauds digits right middle finger dry gangrene  No muscular weakness     EKG:  10/14/18 SR rate 91 nonspecific ST changes    Recent Labs: 10/10/2018: Magnesium 1.8; TSH  2.153 10/22/2018: ALT 20 11/07/2018: BUN 21; Creatinine, Ser 1.76; Hemoglobin 11.3;  Platelets 477; Potassium 3.8; Sodium 140    Lipid Panel    Component Value Date/Time   CHOL 168 08/07/2017 1527   TRIG 74.0 08/07/2017 1527   HDL 64.50 08/07/2017 1527   CHOLHDL 3 08/07/2017 1527   VLDL 14.8 08/07/2017 1527   LDLCALC 89 08/07/2017 1527   LDLDIRECT 146.3 01/26/2010 0000      Wt Readings from Last 3 Encounters:  11/07/18 114 lb (51.7 kg)  11/05/18 117 lb 4.8 oz (53.2 kg)  10/29/18 121 lb (54.9 kg)      Other studies Reviewed: Additional studies/ records that were reviewed today include: Notes Dr Erin Hearing and brackbill 2016 myovue 2016 recent echo Notes primary care .    ASSESSMENT AND PLAN:  1.  Abnormal Echo:  Severe LVH  18 mm septum previously described small LVOT gradient  No murmur on exam  Avoid dehydration consider f/u echo for new symptoms can consider MRI 2. Chest pain atypical normal myovue 2016 *** 3. Bipolar seem compensated and functional  4. DM Discussed low carb diet.  Target hemoglobin A1c is 6.5 or less.  Continue current medications. 5. HtN:  Well controlled.  Continue current medications and low sodium Dash type diet.   6. Raynauds: complicated by dry gangrene and amputation of left long finger. F/U with rheumatology continue  Calcium blocker and nitro ointment Keep hands warm in winter temps Refer to [facility] for possible digital sympathetectomy Per Rheumatology     Current medicines are reviewed at length with the patient today.  The patient does not have concerns regarding medicines.  The following changes have been made:  none  Labs/ tests ordered today include: Lexi Myovue  No orders of the defined types were placed in this encounter.    Disposition:   FU with cardiology in a year      Signed, [name], MD  11/18/2018 8:26 AM    [address], [place], [place]  [address] Phone: [phone]; Fax: [phone]

## 2018-11-19 ENCOUNTER — Ambulatory Visit: Payer: Medicare HMO | Admitting: Primary Care

## 2018-11-19 DIAGNOSIS — M159 Polyosteoarthritis, unspecified: Secondary | ICD-10-CM | POA: Diagnosis not present

## 2018-11-19 DIAGNOSIS — I129 Hypertensive chronic kidney disease with stage 1 through stage 4 chronic kidney disease, or unspecified chronic kidney disease: Secondary | ICD-10-CM | POA: Diagnosis not present

## 2018-11-19 DIAGNOSIS — E1122 Type 2 diabetes mellitus with diabetic chronic kidney disease: Secondary | ICD-10-CM | POA: Diagnosis not present

## 2018-11-19 DIAGNOSIS — M4802 Spinal stenosis, cervical region: Secondary | ICD-10-CM | POA: Diagnosis not present

## 2018-11-19 DIAGNOSIS — E1152 Type 2 diabetes mellitus with diabetic peripheral angiopathy with gangrene: Secondary | ICD-10-CM | POA: Diagnosis not present

## 2018-11-19 DIAGNOSIS — G8929 Other chronic pain: Secondary | ICD-10-CM | POA: Diagnosis not present

## 2018-11-19 DIAGNOSIS — Z4781 Encounter for orthopedic aftercare following surgical amputation: Secondary | ICD-10-CM | POA: Diagnosis not present

## 2018-11-19 DIAGNOSIS — M5030 Other cervical disc degeneration, unspecified cervical region: Secondary | ICD-10-CM | POA: Diagnosis not present

## 2018-11-19 DIAGNOSIS — M5136 Other intervertebral disc degeneration, lumbar region: Secondary | ICD-10-CM | POA: Diagnosis not present

## 2018-11-20 ENCOUNTER — Ambulatory Visit: Payer: Medicare HMO | Admitting: Cardiovascular Disease

## 2018-11-20 ENCOUNTER — Other Ambulatory Visit: Payer: Self-pay | Admitting: *Deleted

## 2018-11-20 NOTE — Patient Outreach (Signed)
[facility]) Care Management  11/20/2018  CAYLI BREHM Jan 06, 1951 [id number]   Call placed to member to follow up on current condition and contact with home health company regarding aide services.  No answer, HIPAA compliant voice message left, will follow up within the next 4 business days.  Kemper Durie, [place], MSN [facility] Care Management  [facility] Manager [phone]

## 2018-11-21 ENCOUNTER — Telehealth: Payer: Self-pay

## 2018-11-21 DIAGNOSIS — M4802 Spinal stenosis, cervical region: Secondary | ICD-10-CM | POA: Diagnosis not present

## 2018-11-21 DIAGNOSIS — I129 Hypertensive chronic kidney disease with stage 1 through stage 4 chronic kidney disease, or unspecified chronic kidney disease: Secondary | ICD-10-CM | POA: Diagnosis not present

## 2018-11-21 DIAGNOSIS — M5030 Other cervical disc degeneration, unspecified cervical region: Secondary | ICD-10-CM | POA: Diagnosis not present

## 2018-11-21 DIAGNOSIS — E1152 Type 2 diabetes mellitus with diabetic peripheral angiopathy with gangrene: Secondary | ICD-10-CM | POA: Diagnosis not present

## 2018-11-21 DIAGNOSIS — G8929 Other chronic pain: Secondary | ICD-10-CM | POA: Diagnosis not present

## 2018-11-21 DIAGNOSIS — Z4781 Encounter for orthopedic aftercare following surgical amputation: Secondary | ICD-10-CM | POA: Diagnosis not present

## 2018-11-21 DIAGNOSIS — M5136 Other intervertebral disc degeneration, lumbar region: Secondary | ICD-10-CM | POA: Diagnosis not present

## 2018-11-21 DIAGNOSIS — E1122 Type 2 diabetes mellitus with diabetic chronic kidney disease: Secondary | ICD-10-CM | POA: Diagnosis not present

## 2018-11-21 DIAGNOSIS — M159 Polyosteoarthritis, unspecified: Secondary | ICD-10-CM | POA: Diagnosis not present

## 2018-11-21 NOTE — Telephone Encounter (Signed)
Author phoned pt. to assess interest in scheduling subsequent AWV. Pt. did not seem too interested, said that she was with OT at her house right now, but would call back to schedule if interested.

## 2018-11-22 ENCOUNTER — Ambulatory Visit: Payer: Self-pay | Admitting: Rheumatology

## 2018-11-22 DIAGNOSIS — I129 Hypertensive chronic kidney disease with stage 1 through stage 4 chronic kidney disease, or unspecified chronic kidney disease: Secondary | ICD-10-CM | POA: Diagnosis not present

## 2018-11-22 DIAGNOSIS — M159 Polyosteoarthritis, unspecified: Secondary | ICD-10-CM | POA: Diagnosis not present

## 2018-11-22 DIAGNOSIS — M4802 Spinal stenosis, cervical region: Secondary | ICD-10-CM | POA: Diagnosis not present

## 2018-11-22 DIAGNOSIS — G8929 Other chronic pain: Secondary | ICD-10-CM | POA: Diagnosis not present

## 2018-11-22 DIAGNOSIS — E1122 Type 2 diabetes mellitus with diabetic chronic kidney disease: Secondary | ICD-10-CM | POA: Diagnosis not present

## 2018-11-22 DIAGNOSIS — Z4781 Encounter for orthopedic aftercare following surgical amputation: Secondary | ICD-10-CM | POA: Diagnosis not present

## 2018-11-22 DIAGNOSIS — E1152 Type 2 diabetes mellitus with diabetic peripheral angiopathy with gangrene: Secondary | ICD-10-CM | POA: Diagnosis not present

## 2018-11-22 DIAGNOSIS — M5030 Other cervical disc degeneration, unspecified cervical region: Secondary | ICD-10-CM | POA: Diagnosis not present

## 2018-11-22 DIAGNOSIS — M5136 Other intervertebral disc degeneration, lumbar region: Secondary | ICD-10-CM | POA: Diagnosis not present
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
## 2018-11-24 ENCOUNTER — Ambulatory Visit (HOSPITAL_COMMUNITY)
Admission: EM | Admit: 2018-11-24 | Discharge: 2018-11-24 | Disposition: A | Discharge status: Home / Self Care | Payer: Medicare HMO | Attending: Family Medicine | Admitting: Family Medicine

## 2018-11-24 ENCOUNTER — Encounter (HOSPITAL_COMMUNITY): Payer: Self-pay | Admitting: Emergency Medicine

## 2018-11-24 DIAGNOSIS — I96 Gangrene, not elsewhere classified: Secondary | ICD-10-CM

## 2018-11-24 MED ORDER — OXYCODONE HCL 5 MG PO TABS: 5.0000 mg | ORAL_TABLET | Freq: Four times a day (QID) | ORAL | 0 refills | Status: DC | PRN

## 2018-11-24 NOTE — ED Triage Notes (Signed)
Pt c/o R middle finger pain and R pinkie finger pain. Tips of fingers are black. Pt hx of diabetes, htn, etc. States they've been hurting for several weeks.

## 2018-11-24 NOTE — ED Provider Notes (Signed)
[place]    CSN: [id number] Arrival date & time: 11/24/18  1557     History   Chief Complaint No chief complaint on file.   HPI Desiree [name] is a 68 y.o. female.   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  Multiple medical problems.  She is under the care of Dr. Georgina Snell Copy.  There are a couple of fingertips that she states "he is watching".  They appear to have decreased circulation.  She had more pain in her right ring finger today, with dark discoloration to the tip, so she came in for evaluation.  She does not want to go to the emergency room.  She does not want to be admitted.  Past Medical History:  Diagnosis Date  . Anxiety   . Bipolar disorder ([place])   . CANDIDIASIS, ESOPHAGEAL 07/28/2009   Qualifier: Diagnosis of  By: [name] MD, [name]   . Chronic kidney disease    CKD III  . COPD (chronic obstructive pulmonary disease) ([place])   . Depression   . Diabetes mellitus without complication (HCC)    no meds  . FH: colonic polyps   . Fractured elbow    right   . GERD (gastroesophageal reflux disease)   . HH (hiatus hernia)   . History of carpal tunnel syndrome   . History of chest pain   . History of transfusion of packed red blood cells   . Hyperlipidemia   . Hypertension   . Neuroleptic-induced tardive dyskinesia   . Osteoarthritis of more than one site   . Seasonal allergies     Patient Active Problem List   Diagnosis Date Noted  . History of fall 11/05/2018  . Weight loss 11/05/2018  . Weakness 11/05/2018  . Gangrene of finger ([place]) 10/09/2018  . Raynaud's phenomenon with gangrene ([place]) 10/09/2018  . LVH (left ventricular hypertrophy) 10/09/2018  . Vasospasm ([place]) 09/06/2018  . Hypotension 08/08/2018  . Leucocytosis 08/08/2018  . Acute gastritis without bleeding 08/08/2018  . Elevated troponin I level 08/08/2018  . Nausea and vomiting 08/06/2018  . Pneumonia 07/12/2018  . COPD  exacerbation ([place]) 07/11/2018  . Primary osteoarthritis of both feet 12/21/2017  . DDD (degenerative disc disease), lumbar 12/21/2017  . Primary osteoarthritis of both knees 12/21/2017  . History of bilateral carpal tunnel release 12/21/2017  . DDD (degenerative disc disease), cervical 11/15/2017  . Former smoker 11/15/2017  . Bronchiectasis without complication ([place]) 71/24/5809  . Impingement syndrome of right shoulder 07/25/2017  . Hx of fusion of cervical spine 07/25/2017  . Cervical spinal stenosis 09/27/2016  . Polypharmacy 06/23/2015  . Hyperlipidemia 04/19/2015  . Visit for preventive health examination 01/01/2015  . Primary osteoarthritis involving multiple joints 01/01/2015  . Bipolar affective disorder, currently depressed, moderate ([place])   . Bipolar I disorder with mania ([place]) 08/17/2014  . Hypertension 10/21/2013  . Dizziness 03/25/2013  . Medication withdrawal ([place]) 03/05/2013  . Diabetes mellitus with renal manifestations, controlled ([place]) 11/10/2012  . Alopecia areata 02/06/2012  . Obesity (BMI 30-39.9) 02/06/2012  . Renal insufficiency 07/16/2011  . Neuroleptic-induced tardive dyskinesia   . Exertional dyspnea 02/07/2011  . Dyspnea on exertion 01/19/2011  . DM (diabetes mellitus), type 2 ([place]) 04/22/2010  . Carpal tunnel syndrome 02/02/2010  . CONSTIPATION 02/02/2010  . Primary osteoarthritis of both hands 02/02/2010  . CATARACTS 04/13/2009  . PAIN IN JOINT, ANKLE AND FOOT 09/16/2008  . Eosinophilia 07/21/2008  .  AFFECTIVE DISORDER 04/21/2008  . BACK PAIN, CHRONIC 02/12/2008  . LEG PAIN 02/12/2008  . ABNORMAL INVOLUNTARY MOVEMENTS 12/04/2007  . POSTURAL LIGHTHEADEDNESS 11/04/2007  . COLONIC POLYPS, HX OF 11/04/2007  . Essential hypertension 06/17/2007  . HYPERLIPIDEMIA 04/08/2007  . MITRAL VALVE PROLAPSE 04/08/2007  . GERD 04/08/2007  . LOW BACK PAIN SYNDROME 04/08/2007  . CHEST PAIN, RECURRENT 04/08/2007    Past Surgical History:  Procedure Laterality  Date  . AMPUTATION Left 10/14/2018   Procedure: AMPUTATION LEFT LONG FINGER TIP;  Surgeon: Dayna Barker, MD;  Location: [facility];  Service: Plastics;  Laterality: Left;  . ANTERIOR CERVICAL DECOMP/DISCECTOMY FUSION  09/27/2016   C5-6 anterior cervical discectomy and fusion, allograft and plate/notes 09/27/2016  . ANTERIOR CERVICAL DECOMP/DISCECTOMY FUSION N/A 09/27/2016   Procedure: C5-6 Anterior Cervical Discectomy and Fusion, Allograft and Plate;  Surgeon: [name], MD;  Location: [facility];  Service: Orthopedics;  Laterality: N/A;  . Back Fusion  2002  . BIOPSY  07/19/2018   Procedure: BIOPSY;  Surgeon: [name], MD;  Location: [facility];  Service: Endoscopy;;  . CARPAL TUNNEL RELEASE  yates   left  . COLONOSCOPY N/A 01/05/2014   Procedure: COLONOSCOPY;  Surgeon: [name], MD;  Location: [facility];  Service: Endoscopy;  Laterality: N/A;  . ELBOW SURGERY     age 70  . ESOPHAGOGASTRODUODENOSCOPY (EGD) WITH PROPOFOL N/A 07/19/2018   Procedure: ESOPHAGOGASTRODUODENOSCOPY (EGD) WITH PROPOFOL;  Surgeon: [name], MD;  Location: [facility];  Service: Endoscopy;  Laterality: N/A;  . EXTERNAL EAR SURGERY Left   . EYE SURGERY     "removed white dots under eyelid"  . FINGER SURGERY Left   . Juvara osteomy    . KNEE SURGERY    . NOSE SURGERY    . Rt. toe bunion    . skin, shave biopsy  05/03/2016   Left occipital scalp, top of scalp  . UPPER EXTREMITY ANGIOGRAPHY Bilateral 10/11/2018   Procedure: UPPER EXTREMITY ANGIOGRAPHY;  Surgeon: [name], MD;  Location: [facility];  Service: Cardiovascular;  Laterality: Bilateral;    OB History   No obstetric history on file.      Home Medications    Prior to Admission medications   Medication Sig Start Date End Date Taking? Authorizing Provider  acetaminophen (TYLENOL) 500 MG tablet Take 2 tablets (1,000 mg total) by mouth every 6 (six) hours as needed. Patient taking differently: Take 1,000 mg by mouth every 6  (six) hours as needed for mild pain or headache.  09/13/15   [name], MD  amLODipine (NORVASC) 10 MG tablet Take 1 tablet (10 mg total) by mouth daily. 10/16/18 11/15/18  Dessa Phi, DO  ANORO ELLIPTA 62.5-25 MCG/INH AEPB INHALE 1 PUFF ONCE DAILY Patient taking differently: Inhale 1 puff into the lungs daily.  11/06/18   [name], MD  aspirin EC 81 MG tablet Take by mouth. 10/25/18   [provider]  atorvastatin (LIPITOR) 20 MG tablet TAKE 1 TABLET BY MOUTH ONCE DAILY Patient taking differently: Take 20 mg by mouth daily.  06/27/18   [name], [name], MD  Blood Glucose Monitoring Suppl (ACCU-CHEK NANO SMARTVIEW) W/DEVICE KIT Use to check blood sugar 1-2 times daily Patient taking differently: 1 strip by Other route 2 (two) times a week.  11/26/14   [name], [name], MD  cetirizine (ZYRTEC) 10 MG tablet Take 10 mg by mouth at bedtime.     [provider]  diclofenac sodium (VOLTAREN) 1 %  GEL Apply 3 grams to 3 large joints, up to 3 times daily. Patient taking differently: Apply 3 g topically See admin instructions. Apply 3 grams to three large joints, up to three times a day 01/29/18   Bo Merino, MD  docusate sodium (COLACE) 100 MG capsule Take 100 mg by mouth at bedtime.     [provider]  DULoxetine (CYMBALTA) 60 MG capsule Take 1 capsule (60 mg total) by mouth daily. 11/08/18   Shugart, Lissa Hoard, PA-C  feeding supplement, ENSURE ENLIVE, (ENSURE ENLIVE) LIQD Take 237 mLs by mouth 3 (three) times daily between meals. Patient taking differently: Take 237 mLs by mouth 2 (two) times daily after a meal.  07/20/18   [name], MD  fluticasone (FLONASE) 50 MCG/ACT nasal spray Place 1-2 sprays into both nostrils at bedtime.     [provider]  glucose blood test strip Accu-chek nano, test glucose once daily, dx E11.9 09/06/18   [name], [name], MD  guaiFENesin (MUCINEX) 600 MG 12 hr tablet Take 600 mg by mouth at bedtime.    [provider]  hydroxychloroquine (PLAQUENIL) 200 MG tablet TAKE 1 TABLET BY MOUTH TWICE DAILY, MONDAY-FRIDAY. NONE ON SATURDAY OR SUNDAY Patient taking differently: Take 200 mg by mouth 2 (two) times daily. Monday through Friday, take none on Saturday or Sunday. 10/25/18   Bo Merino, MD  ipratropium-albuterol (DUONEB) 0.5-2.5 (3) MG/3ML SOLN Take 3 mLs by nebulization 3 (three) times daily. 11/18/18   [name], MD  lamoTRIgine (LAMICTAL) 25 MG tablet Take 1 tablet (25 mg total) by mouth 3 (three) times daily. 11/08/18   Shugart, Lissa Hoard, PA-C  Lancets Misc. (ACCU-CHEK FASTCLIX LANCET) KIT USE TO CHECK BLOOD SUGAR 1-2 TIMES DAILY Dx E11.9 09/06/18   [name], [name], MD  linaclotide (LINZESS) 145 MCG CAPS capsule Take 145 mcg by mouth daily as needed (constipation).    [provider]  losartan (COZAAR) 50 MG tablet Take 50 mg by mouth daily.  10/04/18   [provider]  montelukast (SINGULAIR) 10 MG tablet TAKE 1 TABLET BY MOUTH AT BEDTIME Patient taking differently: Take 10 mg by mouth at bedtime.  08/19/18   [name], MD  Multiple Vitamins-Minerals (CENTRUM SILVER 50+WOMEN) TABS Take 1 tablet by mouth daily.    [provider]  nitroGLYCERIN (NITRO-BID) 2 % ointment APPLY 1/4 INCH TOPICALLY TO AFFECTED FINGERTIPS THREE TIMES DAILY Patient taking differently: Apply 0.25 inches topically 3 (three) times daily. To affected fingertips 10/18/18   Bo Merino, MD  NON FORMULARY Respivest: Three times a day for 30 minutes    [provider]  ondansetron (ZOFRAN-ODT) 4 MG disintegrating tablet DISSOLVE 1 TABLET(4 MG) ON THE TONGUE EVERY 8 HOURS AS NEEDED FOR NAUSEA OR VOMITING Patient taking differently: Take 4 mg by mouth every 8 (eight) hours as needed for nausea or vomiting.  08/13/18   [name], [name], MD  OVER THE COUNTER MEDICATION Take 1 capsule by mouth See admin instructions. Fdgard capsules: Take 1 capsule by mouth once a day    [provider]  oxyCODONE (ROXICODONE) 5 MG immediate release tablet Take 1 tablet (5 mg total) by mouth every 6 (six) hours as needed for severe pain. 11/24/18   Raylene Everts, MD  pantoprazole (PROTONIX) 40 MG tablet Take 40 mg by mouth daily.    [provider]  Probiotic Product (ALIGN) 4 MG CAPS Take 4 mg by mouth daily.    [provider]  Respiratory Therapy Supplies (FLUTTER) DEVI 1 Device by Does not apply route as directed. Patient taking differently: 1 Device as directed.  10/24/17   Parrett, Fonnie Mu, NP  sodium chloride HYPERTONIC 3 % nebulizer solution Take by nebulization 2 (two) times daily. Patient taking differently: Take 4 mLs by nebulization 2 (two) times daily.  01/18/18   [name], MD  triamcinolone cream (KENALOG) 0.1 % Apply 1 application topically daily as needed (for itching of affected areas).  09/26/18   [provider]    Family History Family History  Problem Relation Age of Onset  . Heart attack Father   . Heart disease Father   . Throat cancer Brother   . Diabetes Mother   . Hypertension Mother   . Heart attack Mother   . Diabetes type II Brother   . Heart disease Brother   . Lung cancer Brother   . Breast cancer Cousin   . Lung cancer Daughter   . Lung cancer Paternal Uncle     Social History Social History   Tobacco Use  . Smoking status: Former Smoker    Packs/day: 2.00    Years: 30.00    Pack years: 60.00    Types: Cigarettes    Last attempt to quit: 10/23/2001    Years since quitting: 17.0  . Smokeless tobacco: Never Used  Substance Use Topics  . Alcohol use: No  . Drug use: No     Allergies   Prednisone; Solu-medrol [methylprednisolone]; Amoxicillin; Codeine; Hydrocodone-acetaminophen; Penicillins; and Tape   Review of Systems Review of Systems  Constitutional: Negative for chills and fever.  HENT: Negative for ear pain and sore throat.   Eyes: Negative for pain and visual disturbance.    Respiratory: Negative for cough and shortness of breath.   Cardiovascular: Negative for chest pain and palpitations.  Gastrointestinal: Negative for abdominal pain and vomiting.  Genitourinary: Negative for dysuria and hematuria.  Musculoskeletal: Negative for arthralgias and back pain.  Skin: Positive for color change. Negative for rash.  Neurological: Negative for seizures and syncope.  All other systems reviewed and are negative.    Physical Exam Triage Vital Signs ED Triage Vitals  Enc Vitals Group     BP 11/24/18 1622 134/67     Pulse Rate 11/24/18 1622 89     Resp 11/24/18 1622 18     Temp 11/24/18 1622 97.6 F (36.4 C)     Temp src --      SpO2 11/24/18 1622 99 %     Weight --      Height --      Head Circumference --      Peak Flow --      Pain Score 11/24/18 1623 8     Pain Loc --      Pain Edu? --      Excl. in [place]? --    No data found.  Updated Vital Signs BP 134/67   Pulse 89   Temp 97.6 F (36.4 C)   Resp 18   SpO2 99%   Visual Acuity Right Eye Distance:   Left Eye Distance:   Bilateral Distance:    Right Eye Near:   Left Eye Near:    Bilateral Near:     Physical Exam Constitutional:      General: She is not in acute distress.    Appearance: She is well-developed.  HENT:     Head: Normocephalic and atraumatic.  Eyes:     Conjunctiva/sclera:  Conjunctivae normal.     Pupils: Pupils are equal, round, and reactive to light.  Neck:     Musculoskeletal: Normal range of motion.  Cardiovascular:     Rate and Rhythm: Normal rate.  Pulmonary:     Effort: Pulmonary effort is normal. No respiratory distress.  Abdominal:     General: There is no distension.     Palpations: Abdomen is soft.  Musculoskeletal: Normal range of motion.       Hands:     Comments: The third and fifth fingers have dry gangrene.  The skin is thickened, discolored, insensate, with a well demarcated edge.  Skin:    General: Skin is warm and dry.  Neurological:      Mental Status: She is alert.      UC Treatments / Results  Labs (all labs ordered are listed, but only abnormal results are displayed) Labs Reviewed - No data to display  EKG None  Radiology No results found.  Procedures Procedures (including critical care time)  Medications Ordered in UC Medications - No data to display  Initial Impression / Assessment and Plan / UC Course  I have reviewed the triage vital signs and the nursing notes.  Pertinent labs & imaging results that were available during my care of the patient were reviewed by me and considered in my medical decision making (see chart for details).    Called Dr. Clovis Riley office. Re-paged again in 30 minutes. 30 minutes after this he did call back.  He states he is out of town.  I described the finding on Ms. Caponigro's hand.  He recommended evaluation in the office next week.  By then I had already discharged her with a dry dressing and instructions to call his office in the morning. Final Clinical Impressions(s) / UC Diagnoses   Final diagnoses:  Gangrene ([place])     Discharge Instructions     Protect finger Take the pain medicine if pain severe This is a strong pain medicine, caution dizziness, drowsiness and falls I will call you with information from Dr Lenon Curt, if he calls me before closing Call his office in the morning   ED Prescriptions    Medication Sig Dispense Auth. Provider   oxyCODONE (ROXICODONE) 5 MG immediate release tablet Take 1 tablet (5 mg total) by mouth every 6 (six) hours as needed for severe pain. 5 tablet Raylene Everts, MD     Controlled Substance Prescriptions [place] Controlled Substance Registry consulted? Yes, I have consulted the [place] Controlled Substances Registry for this patient, and feel the risk/benefit ratio today is favorable for proceeding with this prescription for a controlled substance.   Raylene Everts, MD 11/24/18 [phone]

## 2018-11-24 NOTE — Discharge Instructions (Addendum)
Protect finger Take the pain medicine if pain severe This is a strong pain medicine, caution dizziness, drowsiness and falls I will call you with information from Dr Izora Ribas, if he calls me before closing Call his office in the morning

## 2018-11-25 ENCOUNTER — Ambulatory Visit: Payer: Self-pay

## 2018-11-25 ENCOUNTER — Telehealth: Payer: Self-pay

## 2018-11-25 ENCOUNTER — Emergency Department (HOSPITAL_COMMUNITY)
Admission: EM | Admit: 2018-11-25 | Discharge: 2018-11-26 | Disposition: A | Discharge status: Home / Self Care | Payer: Medicare HMO | Attending: Emergency Medicine | Admitting: Emergency Medicine

## 2018-11-25 ENCOUNTER — Encounter (HOSPITAL_COMMUNITY): Payer: Self-pay | Admitting: *Deleted

## 2018-11-25 ENCOUNTER — Telehealth: Payer: Self-pay | Admitting: *Deleted

## 2018-11-25 ENCOUNTER — Other Ambulatory Visit: Payer: Self-pay

## 2018-11-25 ENCOUNTER — Other Ambulatory Visit: Payer: Self-pay | Admitting: *Deleted

## 2018-11-25 DIAGNOSIS — Z794 Long term (current) use of insulin: Secondary | ICD-10-CM | POA: Insufficient documentation

## 2018-11-25 DIAGNOSIS — Z79899 Other long term (current) drug therapy: Secondary | ICD-10-CM | POA: Diagnosis not present

## 2018-11-25 DIAGNOSIS — I129 Hypertensive chronic kidney disease with stage 1 through stage 4 chronic kidney disease, or unspecified chronic kidney disease: Secondary | ICD-10-CM | POA: Insufficient documentation

## 2018-11-25 DIAGNOSIS — J441 Chronic obstructive pulmonary disease with (acute) exacerbation: Secondary | ICD-10-CM | POA: Diagnosis not present

## 2018-11-25 DIAGNOSIS — Z7982 Long term (current) use of aspirin: Secondary | ICD-10-CM | POA: Diagnosis not present

## 2018-11-25 DIAGNOSIS — M79641 Pain in right hand: Secondary | ICD-10-CM | POA: Diagnosis present

## 2018-11-25 DIAGNOSIS — E119 Type 2 diabetes mellitus without complications: Secondary | ICD-10-CM | POA: Diagnosis not present

## 2018-11-25 DIAGNOSIS — I96 Gangrene, not elsewhere classified: Secondary | ICD-10-CM | POA: Diagnosis not present

## 2018-11-25 DIAGNOSIS — N183 Chronic kidney disease, stage 3 (moderate): Secondary | ICD-10-CM | POA: Diagnosis not present

## 2018-11-25 DIAGNOSIS — Z87891 Personal history of nicotine dependence: Secondary | ICD-10-CM | POA: Diagnosis not present

## 2018-11-25 NOTE — Telephone Encounter (Signed)
Left detailed message for cameron from [facility] home health that agreeing with GI management of vomiting

## 2018-11-25 NOTE — Telephone Encounter (Signed)
Patient had called in to [facility] to be triaged about arthritis pain / [facility] Nurse attempted to call the patient back at 11:25 and did not get an answer. / Patient called back in to advise that she has spoke to Dr. Fatima Sanger nurse and has been instructed to go to the ED for evaluation. / This nurse advised patient I would make Dr. Fabian Sharp aware.

## 2018-11-25 NOTE — Telephone Encounter (Signed)
Home health form and charge sheet in red folder

## 2018-11-25 NOTE — Telephone Encounter (Signed)
Patient called in stating she was seen in urgent care on 11/24/18. Patient states she is having pain on the tip of the third finger right hand. She states that the doctor at the urgent care wanted to send her to urgent care but she did not want to be admitted so she refused to go. Patient advised per Dr. Corliss Skains that she needs to go to the emergency room for evaluation. She asked if they would admit her. Advised patient if they chose to admit her that it is what it best for her health. Patient states "she would get there somewhere around 2:00 pm". I advised patient to get there as soon as she could.

## 2018-11-25 NOTE — Patient Outreach (Addendum)
[facility]) Care Management  11/25/2018  Desiree [name] Jan 10, 1951 [id number]   Call placed to member to follow up on current medical condition.  Noted member was seen in ED yesterday for gangrene of fingers.  No answer, HIPAA compliant voice message left.  Unsuccessful outreach letter sent, will follow up within the next 4 business days.    Update:  Call received back from member.  She state she continues to have pain in her fingers.  Report she has contacted Dr. Fatima Sanger office and was told to present to the ED for evaluation.  She expresses concern regarding trouble establishing plan of care that will keep her from losing more fingers.  Hope to have more answers once ED visit is complete.  Denies any urgent concerns, will proceed to ED for evaluation.  Will follow up within the next 2 weeks.  Kemper Durie, [place], MSN [facility] Care Management  [facility] Manager [phone]

## 2018-11-25 NOTE — ED Triage Notes (Addendum)
Pt with hx of Reynaud's phenomenon.  Was seen at [facility] f/t R middle finger injury.  She was instructed by her doctor to come to the ED for evaluation.  Pt reports she was supposed to see the hand surgeon for evaluation.

## 2018-11-25 NOTE — Telephone Encounter (Signed)
Noted   Agree with having Gi management of vomiting

## 2018-11-25 NOTE — Telephone Encounter (Signed)
Copied from CRM [id number]. Topic: General - Other >> Nov 22, 2018  5:07 PM Jilda Roche wrote: Reason for CRM: Sheria Lang from [facility] called stating that they missed an OT this week she was seen for 1 of 2. Also patient was vomiting yesterday when she was there, she has been communicating with her GI doctor about this.  Best call back is [phone]  Molli Knock to leave a message on secure line

## 2018-11-26 MED ORDER — OXYCODONE HCL 5 MG PO TABS: 5.0000 mg | ORAL_TABLET | Freq: Four times a day (QID) | ORAL | 0 refills | Status: DC | PRN

## 2018-11-26 NOTE — ED Provider Notes (Signed)
Medical screening examination/treatment/procedure(s) were conducted as a shared visit with non-physician practitioner(s) and myself.  I personally evaluated the patient during the encounter.  None   Patient is a 68 year old female who presents the emergency department with dry gangrene to the right third digit that has been present for 2 months.  No significant change.  States she is having pain in the ibuprofen and tramadol are not relieving.  Was discharged with oxycodone from urgent care yesterday but states pharmacy was closed before she could pick it up.  No purulent drainage.  No fever.  Has follow-up with Dr. Izora Ribas with hand surgery tomorrow.  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  No indication for admission today.  Does not need antibiotics.  Discussed return precautions.  Strong radial pulse on examination with normal capillary refill in other fingers.   Krystie Leiter, Layla Maw, DO 11/26/18 [phone]

## 2018-11-26 NOTE — ED Notes (Signed)
Pt discharged from ED; instructions provided and scripts given; Pt encouraged to return to ED if symptoms worsen and to f/u with PCP; Pt verbalized understanding of all instructions 

## 2018-11-26 NOTE — ED Notes (Addendum)
Pt took (2) extra strength Tylenol at 9p

## 2018-11-26 NOTE — ED Provider Notes (Signed)
[place] EMERGENCY DEPARTMENT Provider Note   CSN: [id number] Arrival date & time: 11/25/18  1529     History   Chief Complaint Chief Complaint  Patient presents with  . Hand Pain    HPI DESTENEE GUERRY is a 68 y.o. female.  68 year old female with a history of CKD, COPD, diabetes, dyslipidemia, hypertension, Raynaud's presents to the emergency department for evaluation at the advice of her rheumatologist.  She was seen at urgent care on 11/24/2018 for evaluation of worsening pain to her right third digit.  She is being followed by Dr. Lenon Curt of hand surgery for suspected Raynaud's.  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  She states that her pain is constant and aggravated when her nail of the affected finger is touched or moved.  The patient has had no relief with home Tylenol and was given an Rx for oxycodone by [facility] yesterday, but was unable to pick up the Rx before the pharmacy closed.  Denies fevers, purulent drainage, increasing redness to the digit.  She is scheduled to see Dr. Lenon Curt in the office for follow up tomorrow, on the 5th.  The history is provided by the patient. No language interpreter was used.  Hand Pain     Past Medical History:  Diagnosis Date  . Anxiety   . Bipolar disorder ([place])   . CANDIDIASIS, ESOPHAGEAL 07/28/2009   Qualifier: Diagnosis of  By: [name] MD, [name]   . Chronic kidney disease    CKD III  . COPD (chronic obstructive pulmonary disease) ([place])   . Depression   . Diabetes mellitus without complication (HCC)    no meds  . FH: colonic polyps   . Fractured elbow    right   . GERD (gastroesophageal reflux disease)   . HH (hiatus hernia)   . History of carpal tunnel syndrome   . History of chest pain   . History of transfusion of packed red blood cells   . Hyperlipidemia   . Hypertension   . Neuroleptic-induced tardive dyskinesia   . Osteoarthritis of more than one site   . Seasonal  allergies     Patient Active Problem List   Diagnosis Date Noted  . History of fall 11/05/2018  . Weight loss 11/05/2018  . Weakness 11/05/2018  . Gangrene of finger ([place]) 10/09/2018  . Raynaud's phenomenon with gangrene ([place]) 10/09/2018  . LVH (left ventricular hypertrophy) 10/09/2018  . Vasospasm ([place]) 09/06/2018  . Hypotension 08/08/2018  . Leucocytosis 08/08/2018  . Acute gastritis without bleeding 08/08/2018  . Elevated troponin I level 08/08/2018  . Nausea and vomiting 08/06/2018  . Pneumonia 07/12/2018  . COPD exacerbation ([place]) 07/11/2018  . Primary osteoarthritis of both feet 12/21/2017  . DDD (degenerative disc disease), lumbar 12/21/2017  . Primary osteoarthritis of both knees 12/21/2017  . History of bilateral carpal tunnel release 12/21/2017  . DDD (degenerative disc disease), cervical 11/15/2017  . Former smoker 11/15/2017  . Bronchiectasis without complication ([place]) 71/21/9758  . Impingement syndrome of right shoulder 07/25/2017  . Hx of fusion of cervical spine 07/25/2017  . Cervical spinal stenosis 09/27/2016  . Polypharmacy 06/23/2015  . Hyperlipidemia 04/19/2015  . Visit for preventive health examination 01/01/2015  . Primary osteoarthritis involving multiple joints 01/01/2015  . Bipolar affective disorder, currently depressed, moderate ([place])   . Bipolar I disorder with mania ([place]) 08/17/2014  . Hypertension 10/21/2013  . Dizziness 03/25/2013  . Medication withdrawal ([place]) 03/05/2013  .  Diabetes mellitus with renal manifestations, controlled ([place]) 11/10/2012  . Alopecia areata 02/06/2012  . Obesity (BMI 30-39.9) 02/06/2012  . Renal insufficiency 07/16/2011  . Neuroleptic-induced tardive dyskinesia   . Exertional dyspnea 02/07/2011  . Dyspnea on exertion 01/19/2011  . DM (diabetes mellitus), type 2 ([place]) 04/22/2010  . Carpal tunnel syndrome 02/02/2010  . CONSTIPATION 02/02/2010  . Primary osteoarthritis of both hands 02/02/2010  . CATARACTS  04/13/2009  . PAIN IN JOINT, ANKLE AND FOOT 09/16/2008  . Eosinophilia 07/21/2008  . AFFECTIVE DISORDER 04/21/2008  . BACK PAIN, CHRONIC 02/12/2008  . LEG PAIN 02/12/2008  . ABNORMAL INVOLUNTARY MOVEMENTS 12/04/2007  . POSTURAL LIGHTHEADEDNESS 11/04/2007  . COLONIC POLYPS, HX OF 11/04/2007  . Essential hypertension 06/17/2007  . HYPERLIPIDEMIA 04/08/2007  . MITRAL VALVE PROLAPSE 04/08/2007  . GERD 04/08/2007  . LOW BACK PAIN SYNDROME 04/08/2007  . CHEST PAIN, RECURRENT 04/08/2007    Past Surgical History:  Procedure Laterality Date  . AMPUTATION Left 10/14/2018   Procedure: AMPUTATION LEFT LONG FINGER TIP;  Surgeon: Dayna Barker, MD;  Location: [place];  Service: Plastics;  Laterality: Left;  . ANTERIOR CERVICAL DECOMP/DISCECTOMY FUSION  09/27/2016   C5-6 anterior cervical discectomy and fusion, allograft and plate/notes 09/27/2016  . ANTERIOR CERVICAL DECOMP/DISCECTOMY FUSION N/A 09/27/2016   Procedure: C5-6 Anterior Cervical Discectomy and Fusion, Allograft and Plate;  Surgeon: [name], MD;  Location: [place];  Service: Orthopedics;  Laterality: N/A;  . Back Fusion  2002  . BIOPSY  07/19/2018   Procedure: BIOPSY;  Surgeon: [name], MD;  Location: [place];  Service: Endoscopy;;  . CARPAL TUNNEL RELEASE  yates   left  . COLONOSCOPY N/A 01/05/2014   Procedure: COLONOSCOPY;  Surgeon: [name], MD;  Location: [facility];  Service: Endoscopy;  Laterality: N/A;  . ELBOW SURGERY     age 68  . ESOPHAGOGASTRODUODENOSCOPY (EGD) WITH PROPOFOL N/A 07/19/2018   Procedure: ESOPHAGOGASTRODUODENOSCOPY (EGD) WITH PROPOFOL;  Surgeon: [name], MD;  Location: [place];  Service: Endoscopy;  Laterality: N/A;  . EXTERNAL EAR SURGERY Left   . EYE SURGERY     "removed white dots under eyelid"  . FINGER SURGERY Left   . Juvara osteomy    . KNEE SURGERY    . NOSE SURGERY    . Rt. toe bunion    . skin, shave biopsy  05/03/2016   Left occipital scalp, top of scalp  . UPPER  EXTREMITY ANGIOGRAPHY Bilateral 10/11/2018   Procedure: UPPER EXTREMITY ANGIOGRAPHY;  Surgeon: [name], MD;  Location: [place] [facility];  Service: Cardiovascular;  Laterality: Bilateral;     OB History   No obstetric history on file.      Home Medications    Prior to Admission medications   Medication Sig Start Date End Date Taking? Authorizing Provider  acetaminophen (TYLENOL) 500 MG tablet Take 2 tablets (1,000 mg total) by mouth every 6 (six) hours as needed. Patient taking differently: Take 1,000 mg by mouth every 6 (six) hours as needed for mild pain or headache.  09/13/15   [name], MD  amLODipine (NORVASC) 10 MG tablet Take 1 tablet (10 mg total) by mouth daily. 10/16/18 11/15/18  Dessa Phi, DO  ANORO ELLIPTA 62.5-25 MCG/INH AEPB INHALE 1 PUFF ONCE DAILY Patient taking differently: Inhale 1 puff into the lungs daily.  11/06/18   [name], MD  aspirin EC 81 MG tablet Take by mouth. 10/25/18   [provider]  atorvastatin (LIPITOR) 20 MG tablet  TAKE 1 TABLET BY MOUTH ONCE DAILY Patient taking differently: Take 20 mg by mouth daily.  06/27/18   [name], [name], MD  Blood Glucose Monitoring Suppl (ACCU-CHEK NANO SMARTVIEW) W/DEVICE KIT Use to check blood sugar 1-2 times daily Patient taking differently: 1 strip by Other route 2 (two) times a week.  11/26/14   [name], [name], MD  cetirizine (ZYRTEC) 10 MG tablet Take 10 mg by mouth at bedtime.     [provider]  diclofenac sodium (VOLTAREN) 1 % GEL Apply 3 grams to 3 large joints, up to 3 times daily. Patient taking differently: Apply 3 g topically See admin instructions. Apply 3 grams to three large joints, up to three times a day 01/29/18   Bo Merino, MD  docusate sodium (COLACE) 100 MG capsule Take 100 mg by mouth at bedtime.     [provider]  DULoxetine (CYMBALTA) 60 MG capsule Take 1 capsule (60 mg total) by mouth daily. 11/08/18   Shugart, Lissa Hoard, PA-C  feeding  supplement, ENSURE ENLIVE, (ENSURE ENLIVE) LIQD Take 237 mLs by mouth 3 (three) times daily between meals. Patient taking differently: Take 237 mLs by mouth 2 (two) times daily after a meal.  07/20/18   [name], MD  fluticasone (FLONASE) 50 MCG/ACT nasal spray Place 1-2 sprays into both nostrils at bedtime.     [provider]  glucose blood test strip Accu-chek nano, test glucose once daily, dx E11.9 09/06/18   [name], [name], MD  guaiFENesin (MUCINEX) 600 MG 12 hr tablet Take 600 mg by mouth at bedtime.    [provider]  hydroxychloroquine (PLAQUENIL) 200 MG tablet TAKE 1 TABLET BY MOUTH TWICE DAILY, MONDAY-FRIDAY. NONE ON SATURDAY OR SUNDAY Patient taking differently: Take 200 mg by mouth 2 (two) times daily. Monday through Friday, take none on Saturday or Sunday. 10/25/18   Bo Merino, MD  ipratropium-albuterol (DUONEB) 0.5-2.5 (3) MG/3ML SOLN Take 3 mLs by nebulization 3 (three) times daily. 11/18/18   [name], MD  lamoTRIgine (LAMICTAL) 25 MG tablet Take 1 tablet (25 mg total) by mouth 3 (three) times daily. 11/08/18   Shugart, Lissa Hoard, PA-C  Lancets Misc. (ACCU-CHEK FASTCLIX LANCET) KIT USE TO CHECK BLOOD SUGAR 1-2 TIMES DAILY Dx E11.9 09/06/18   [name], [name], MD  linaclotide (LINZESS) 145 MCG CAPS capsule Take 145 mcg by mouth daily as needed (constipation).    [provider]  losartan (COZAAR) 50 MG tablet Take 50 mg by mouth daily.  10/04/18   [provider]  montelukast (SINGULAIR) 10 MG tablet TAKE 1 TABLET BY MOUTH AT BEDTIME Patient taking differently: Take 10 mg by mouth at bedtime.  08/19/18   [name], MD  Multiple Vitamins-Minerals (CENTRUM SILVER 50+WOMEN) TABS Take 1 tablet by mouth daily.    [provider]  nitroGLYCERIN (NITRO-BID) 2 % ointment APPLY 1/4 INCH TOPICALLY TO AFFECTED FINGERTIPS THREE TIMES DAILY Patient taking differently: Apply 0.25 inches topically 3 (three) times daily. To affected  fingertips 10/18/18   Bo Merino, MD  NON FORMULARY Respivest: Three times a day for 30 minutes    [provider]  ondansetron (ZOFRAN-ODT) 4 MG disintegrating tablet DISSOLVE 1 TABLET(4 MG) ON THE TONGUE EVERY 8 HOURS AS NEEDED FOR NAUSEA OR VOMITING Patient taking differently: Take 4 mg by mouth every 8 (eight) hours as needed for nausea or vomiting.  08/13/18   [name], [name], MD  OVER THE COUNTER MEDICATION Take 1 capsule by mouth  See admin instructions. Fdgard capsules: Take 1 capsule by mouth once a day    [provider]  oxyCODONE (ROXICODONE) 5 MG immediate release tablet Take 1 tablet (5 mg total) by mouth every 6 (six) hours as needed for severe pain. 11/26/18   Antonietta Breach, PA-C  pantoprazole (PROTONIX) 40 MG tablet Take 40 mg by mouth daily.    [provider]  Probiotic Product (ALIGN) 4 MG CAPS Take 4 mg by mouth daily.    [provider]  Respiratory Therapy Supplies (FLUTTER) DEVI 1 Device by Does not apply route as directed. Patient taking differently: 1 Device as directed.  10/24/17   Parrett, Fonnie Mu, NP  sodium chloride HYPERTONIC 3 % nebulizer solution Take by nebulization 2 (two) times daily. Patient taking differently: Take 4 mLs by nebulization 2 (two) times daily.  01/18/18   [name], MD  triamcinolone cream (KENALOG) 0.1 % Apply 1 application topically daily as needed (for itching of affected areas).  09/26/18   [provider]    Family History Family History  Problem Relation Age of Onset  . Heart attack Father   . Heart disease Father   . Throat cancer Brother   . Diabetes Mother   . Hypertension Mother   . Heart attack Mother   . Diabetes type II Brother   . Heart disease Brother   . Lung cancer Brother   . Breast cancer Cousin   . Lung cancer Daughter   . Lung cancer Paternal Uncle     Social History Social History   Tobacco Use  . Smoking status: Former Smoker    Packs/day: 2.00     Years: 30.00    Pack years: 60.00    Types: Cigarettes    Last attempt to quit: 10/23/2001    Years since quitting: 17.1  . Smokeless tobacco: Never Used  Substance Use Topics  . Alcohol use: No  . Drug use: No     Allergies   Prednisone; Solu-medrol [methylprednisolone]; Amoxicillin; Codeine; Hydrocodone-acetaminophen; Penicillins; and Tape   Review of Systems Review of Systems Ten systems reviewed and are negative for acute change, except as noted in the HPI.    Physical Exam Updated Vital Signs BP (!) 144/81 (BP Location: Left Arm)   Pulse 62   Temp 98.4 F (36.9 C) (Oral)   Resp 14   SpO2 96%   Physical Exam Vitals signs and nursing note reviewed.  Constitutional:      General: She is not in acute distress.    Appearance: She is well-developed. She is not diaphoretic.     Comments: Nontoxic appearing and in NAD  HENT:     Head: Normocephalic and atraumatic.  Eyes:     General: No scleral icterus.    Conjunctiva/sclera: Conjunctivae normal.  Neck:     Musculoskeletal: Normal range of motion.  Pulmonary:     Effort: Pulmonary effort is normal. No respiratory distress.     Comments: Respirations even and unlabored Musculoskeletal: Normal range of motion.     Comments: Dry gangrene to tip of right 3rd finger. Punctate area of likely developing ischemia to the right 5th finger. 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  Skin:    General: Skin is warm and dry.     Findings: No rash.  Neurological:     Mental Status: She is alert and oriented to person, place, and time.  Psychiatric:        Behavior: Behavior normal.  ED Treatments / Results  Labs (all labs ordered are listed, but only abnormal results are displayed) Labs Reviewed - No data to display  EKG None  Radiology No results found.  Procedures Procedures (including critical care time)  Medications Ordered in ED Medications - No data to display   Initial Impression / Assessment and  Plan / ED Course  I have reviewed the triage vital signs and the nursing notes.  Pertinent labs & imaging results that were available during my care of the patient were reviewed by me and considered in my medical decision making (see chart for details).     68 year old female presenting for increasing pain to her right third digit.  Pain is consistent with uncomplicated dry gangrene of the distal tip of the finger without secondary infection or cellulitis.  Gangrene likely contributing to patient's pain.  Thought to be due to Raynaud's, though history could also favor a degree of chronic PAD.  She was unable to pick up her previously prescribed oxycodone.  Will send 2 tablets to a 24-hour pharmacy as patient is unable to take oral pain medications in the ED because she is driving home.  She has noted to have follow-up with her hand surgeon in the office tomorrow.  No indication for further emergent work-up at this time.  Return precautions discussed and provided.  Patient discharged in stable condition with no unaddressed concerns.   Final Clinical Impressions(s) / ED Diagnoses   Final diagnoses:  Dry gangrene [facility])    ED Discharge Orders         Ordered    oxyCODONE (ROXICODONE) 5 MG immediate release tablet  Every 6 hours PRN     11/26/18 0109           Antonietta Breach, PA-C 11/26/18 0127

## 2018-11-26 NOTE — Discharge Instructions (Signed)
Continue to try and keep your hands warm.  Follow-up with Dr. Izora Ribas in the office tomorrow as scheduled.  You were given a prescription for 2 tablets of oxycodone to take for management of severe pain.  This can be picked up at the [facility] on [address].  Do not drive or drink alcohol after taking oxycodone as it may make you drowsy and impair your judgment.  Continue with Tylenol as needed for additional pain control.  You may return to the ED for new or concerning symptoms.

## 2018-11-27 ENCOUNTER — Other Ambulatory Visit: Payer: Self-pay | Admitting: Pharmacist

## 2018-11-27 NOTE — Progress Notes (Deleted)
Cardiology Office Note   Date:  11/27/2018   ID:  Desiree [name], Desiree [name] July 16, 1951, MRN [id number]  PCP:  [name], MD  Cardiologist:   [name], MD   No chief complaint on file.     History of Present Illness: 68 y.o. f/u chest pain. History of DM, previous smoker quit in 2002. Bipolar. Seen by rheumatology for Raynauds lab work negativ efor lupus anticoagulant , ANA and cardiolipin. Had normal myovue in 2016 TTE October 2019 with severe LVH EF 60-65%  December 2019 developed ulcers on hands/finger tips found to have right radial occlusion and left ulnar occlusion ? 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 Angiogram with no suitable distal bypass target She is to f/u with Dr [name] from Rheumatology   Has some muscle aches and pains over quads not clear if it is related to statin or RA Statin holiday given after last visit   Seen in ED 11/07/18 with multiple complaints. Purulent drainage from finger  Had constant chest pain for 24 hours Also had dyspnea right middle finger with gangrenous tip  SSCP reporducable and two negative troponins  Started on antibiotics and told to f/u with Dr Lenon Curt  Seen again in ER 11/26/18 for dry gangrene and needed oxycodone  *** Past Medical History:  Diagnosis Date  . Anxiety   . Bipolar disorder ([place])   . CANDIDIASIS, ESOPHAGEAL 07/28/2009   Qualifier: Diagnosis of  By: [name] MD, [name]   . Chronic kidney disease    CKD III  . COPD (chronic obstructive pulmonary disease) ([place])   . Depression   . Diabetes mellitus without complication (HCC)    no meds  . FH: colonic polyps   . Fractured elbow    right   . GERD (gastroesophageal reflux disease)   . HH (hiatus hernia)   . History of carpal tunnel syndrome   . History of chest pain   . History of transfusion of packed red blood cells   . Hyperlipidemia   . Hypertension   . Neuroleptic-induced tardive dyskinesia   .  Osteoarthritis of more than one site   . Seasonal allergies     Past Surgical History:  Procedure Laterality Date  . AMPUTATION Left 10/14/2018   Procedure: AMPUTATION LEFT LONG FINGER TIP;  Surgeon: Dayna Barker, MD;  Location: [facility];  Service: Plastics;  Laterality: Left;  . ANTERIOR CERVICAL DECOMP/DISCECTOMY FUSION  09/27/2016   C5-6 anterior cervical discectomy and fusion, allograft and plate/notes 09/27/2016  . ANTERIOR CERVICAL DECOMP/DISCECTOMY FUSION N/A 09/27/2016   Procedure: C5-6 Anterior Cervical Discectomy and Fusion, Allograft and Plate;  Surgeon: [name], MD;  Location: [facility];  Service: Orthopedics;  Laterality: N/A;  . Back Fusion  2002  . BIOPSY  07/19/2018   Procedure: BIOPSY;  Surgeon: [name], MD;  Location: [facility];  Service: Endoscopy;;  . CARPAL TUNNEL RELEASE  yates   left  . COLONOSCOPY N/A 01/05/2014   Procedure: COLONOSCOPY;  Surgeon: [name], MD;  Location: [facility];  Service: Endoscopy;  Laterality: N/A;  . ELBOW SURGERY     age 8  . ESOPHAGOGASTRODUODENOSCOPY (EGD) WITH PROPOFOL N/A 07/19/2018   Procedure: ESOPHAGOGASTRODUODENOSCOPY (EGD) WITH PROPOFOL;  Surgeon: [name], MD;  Location: [facility];  Service: Endoscopy;  Laterality: N/A;  . EXTERNAL EAR SURGERY Left   . EYE SURGERY     "removed white dots under eyelid"  . FINGER  SURGERY Left   . Juvara osteomy    . KNEE SURGERY    . NOSE SURGERY    . Rt. toe bunion    . skin, shave biopsy  05/03/2016   Left occipital scalp, top of scalp  . UPPER EXTREMITY ANGIOGRAPHY Bilateral 10/11/2018   Procedure: UPPER EXTREMITY ANGIOGRAPHY;  Surgeon: [name], MD;  Location: [facility];  Service: Cardiovascular;  Laterality: Bilateral;     Current Outpatient Medications  Medication Sig Dispense Refill  . acetaminophen (TYLENOL) 500 MG tablet Take 2 tablets (1,000 mg total) by mouth every 6 (six) hours as needed. (Patient taking differently: Take 1,000 mg by mouth  every 6 (six) hours as needed for mild pain or headache. ) 30 tablet 0  . amLODipine (NORVASC) 10 MG tablet Take 1 tablet (10 mg total) by mouth daily. 30 tablet 0  . ANORO ELLIPTA 62.5-25 MCG/INH AEPB INHALE 1 PUFF ONCE DAILY (Patient taking differently: Inhale 1 puff into the lungs daily. ) 60 each 5  . aspirin EC 81 MG tablet Take by mouth.    Marland Kitchen atorvastatin (LIPITOR) 20 MG tablet TAKE 1 TABLET BY MOUTH ONCE DAILY (Patient taking differently: Take 20 mg by mouth daily. ) 90 tablet 0  . Blood Glucose Monitoring Suppl (ACCU-CHEK NANO SMARTVIEW) W/DEVICE KIT Use to check blood sugar 1-2 times daily (Patient taking differently: 1 strip by Other route 2 (two) times a week. ) 1 kit 0  . cetirizine (ZYRTEC) 10 MG tablet Take 10 mg by mouth at bedtime.     . diclofenac sodium (VOLTAREN) 1 % GEL Apply 3 grams to 3 large joints, up to 3 times daily. (Patient taking differently: Apply 3 g topically See admin instructions. Apply 3 grams to three large joints, up to three times a day) 3 Tube 3  . docusate sodium (COLACE) 100 MG capsule Take 100 mg by mouth at bedtime.     . DULoxetine (CYMBALTA) 60 MG capsule Take 1 capsule (60 mg total) by mouth daily. 30 capsule 3  . feeding supplement, ENSURE ENLIVE, (ENSURE ENLIVE) LIQD Take 237 mLs by mouth 3 (three) times daily between meals. (Patient taking differently: Take 237 mLs by mouth 2 (two) times daily after a meal. ) 14 Bottle 0  . fluticasone (FLONASE) 50 MCG/ACT nasal spray Place 1-2 sprays into both nostrils at bedtime.     Marland Kitchen glucose blood test strip Accu-chek nano, test glucose once daily, dx E11.9 100 each 12  . guaiFENesin (MUCINEX) 600 MG 12 hr tablet Take 600 mg by mouth at bedtime.    . hydroxychloroquine (PLAQUENIL) 200 MG tablet TAKE 1 TABLET BY MOUTH TWICE DAILY, MONDAY-FRIDAY. NONE ON SATURDAY OR SUNDAY (Patient taking differently: Take 200 mg by mouth 2 (two) times daily. Monday through Friday, take none on Saturday or Sunday.) 40 tablet 0  .  ipratropium-albuterol (DUONEB) 0.5-2.5 (3) MG/3ML SOLN Take 3 mLs by nebulization 3 (three) times daily. 360 mL 11  . lamoTRIgine (LAMICTAL) 25 MG tablet Take 1 tablet (25 mg total) by mouth 3 (three) times daily. 90 tablet 1  . Lancets Misc. (ACCU-CHEK FASTCLIX LANCET) KIT USE TO CHECK BLOOD SUGAR 1-2 TIMES DAILY Dx E11.9 1 kit 1  . linaclotide (LINZESS) 145 MCG CAPS capsule Take 145 mcg by mouth daily as needed (constipation).    Marland Kitchen losartan (COZAAR) 50 MG tablet Take 50 mg by mouth daily.     . montelukast (SINGULAIR) 10 MG tablet TAKE 1 TABLET BY MOUTH AT  BEDTIME (Patient taking differently: Take 10 mg by mouth at bedtime. ) 90 tablet 0  . Multiple Vitamins-Minerals (CENTRUM SILVER 50+WOMEN) TABS Take 1 tablet by mouth daily.    . nitroGLYCERIN (NITRO-BID) 2 % ointment APPLY 1/4 INCH TOPICALLY TO AFFECTED FINGERTIPS THREE TIMES DAILY (Patient taking differently: Apply 0.25 inches topically 3 (three) times daily. To affected fingertips) 30 g 0  . NON FORMULARY Respivest: Three times a day for 30 minutes    . ondansetron (ZOFRAN-ODT) 4 MG disintegrating tablet DISSOLVE 1 TABLET(4 MG) ON THE TONGUE EVERY 8 HOURS AS NEEDED FOR NAUSEA OR VOMITING (Patient taking differently: Take 4 mg by mouth every 8 (eight) hours as needed for nausea or vomiting. ) 20 tablet 0  . OVER THE COUNTER MEDICATION Take 1 capsule by mouth See admin instructions. Fdgard capsules: Take 1 capsule by mouth once a day    . oxyCODONE (ROXICODONE) 5 MG immediate release tablet Take 1 tablet (5 mg total) by mouth every 6 (six) hours as needed for severe pain. 2 tablet 0  . pantoprazole (PROTONIX) 40 MG tablet Take 40 mg by mouth daily.    . Probiotic Product (ALIGN) 4 MG CAPS Take 4 mg by mouth daily.    Marland Kitchen Respiratory Therapy Supplies (FLUTTER) DEVI 1 Device by Does not apply route as directed. (Patient taking differently: 1 Device as directed. ) 1 each 0  . sodium chloride HYPERTONIC 3 % nebulizer solution Take by nebulization 2  (two) times daily. (Patient taking differently: Take 4 mLs by nebulization 2 (two) times daily. ) 750 mL 5  . triamcinolone cream (KENALOG) 0.1 % Apply 1 application topically daily as needed (for itching of affected areas).   0   No current facility-administered medications for this visit.     Allergies:   Prednisone; Solu-medrol [methylprednisolone]; Amoxicillin; Codeine; Hydrocodone-acetaminophen; Penicillins; and Tape    Social History:  The patient  reports that she quit smoking about 17 years ago. Her smoking use included cigarettes. She has a 60.00 pack-year smoking history. She has never used smokeless tobacco. She reports that she does not drink alcohol or use drugs.   Family History:  The patient's family history includes Breast cancer in her cousin; Diabetes in her mother; Diabetes type II in her brother; Heart attack in her father and mother; Heart disease in her brother and father; Hypertension in her mother; Lung cancer in her brother, daughter, and paternal uncle; Throat cancer in her brother.    ROS:  Please see the history of present illness.   Otherwise, review of systems are positive for none.   All other systems are reviewed and negative.    PHYSICAL EXAM: VS:  There were no vitals taken for this visit. , BMI There is no height or weight on file to calculate BMI. Affect appropriate Overweight black female  HEENT: normal Neck supple with no adenopathy JVP normal no bruits no thyromegaly Lungs clear with no wheezing and good diaphragmatic motion Heart:  S1/S2 no murmur, no rub, gallop or click PMI normal Abdomen: benighn, BS positve, no tenderness, no AAA no bruit.  No HSM or HJR Distal pulses intact with no bruits No edema Neuro non-focal Post amputation of left middle finger Raynauds digits right middle finger dry gangrene  No muscular weakness     EKG:  10/14/18 SR rate 91 nonspecific ST changes    Recent Labs: 10/10/2018: Magnesium 1.8; TSH  2.153 10/22/2018: ALT 20 11/07/2018: BUN 21; Creatinine, Ser 1.76; Hemoglobin 11.3; Platelets 477;  Potassium 3.8; Sodium 140    Lipid Panel    Component Value Date/Time   CHOL 168 08/07/2017 1527   TRIG 74.0 08/07/2017 1527   HDL 64.50 08/07/2017 1527   CHOLHDL 3 08/07/2017 1527   VLDL 14.8 08/07/2017 1527   LDLCALC 89 08/07/2017 1527   LDLDIRECT 146.3 01/26/2010 0000      Wt Readings from Last 3 Encounters:  11/07/18 114 lb (51.7 kg)  11/05/18 117 lb 4.8 oz (53.2 kg)  10/29/18 121 lb (54.9 kg)      Other studies Reviewed: Additional studies/ records that were reviewed today include: Notes Dr Erin Hearing and brackbill 2016 myovue 2016 recent echo Notes primary care .    ASSESSMENT AND PLAN:  1.  Abnormal Echo:  Severe LVH  18 mm septum previously described small LVOT gradient  No murmur on exam  Avoid dehydration consider f/u echo for new symptoms can consider MRI 2. Chest pain atypical normal myovue 2016 *** 3. Bipolar seem compensated and functional  4. DM Discussed low carb diet.  Target hemoglobin A1c is 6.5 or less.  Continue current medications. 5. HtN:  Well controlled.  Continue current medications and low sodium Dash type diet.   6. Raynauds: complicated by dry gangrene and amputation of left long finger. F/U with rheumatology continue  Calcium blocker and nitro ointment Keep hands warm in winter temps Refer to [facility] for possible digital sympathetectomy Per Rheumatology Now with dry gangrene of right middle finger     Current medicines are reviewed at length with the patient today.  The patient does not have concerns regarding medicines.  The following changes have been made:  none  Labs/ tests ordered today include: Lexi Myovue  No orders of the defined types were placed in this encounter.    Disposition:   FU with cardiology in a year      Signed, [name], MD  11/27/2018 2:19 [facility] [place], [place],  [place]  [address] Phone: [phone]; Fax: [phone]

## 2018-11-27 NOTE — Patient Outreach (Signed)
[facility]) Care Management [facility]  11/27/2018  ANALYS KNUTESON 05-07-1951 [id number]   [facility] pharmacy case is being closed due to the following reasons:  We have been unable to establish and/or maintain contact with the patient.  Confirmed via other [facility] CM member that patient did receive Anora Ellipta in 2019 from [employer].    Patient has been provided [facility] CM contact information if assistance needed in the future.    Thank you for allowing [facility] pharmacy to be involved in this patient's care.    Haynes Hoehn, PharmD, [facility] Clinical Pharmacist [facility] [phone]

## 2018-11-28 ENCOUNTER — Telehealth: Payer: Self-pay | Admitting: *Deleted

## 2018-11-28 ENCOUNTER — Ambulatory Visit: Payer: Medicare HMO | Admitting: Pulmonary Disease

## 2018-11-28 DIAGNOSIS — M5136 Other intervertebral disc degeneration, lumbar region: Secondary | ICD-10-CM | POA: Diagnosis not present

## 2018-11-28 DIAGNOSIS — M4802 Spinal stenosis, cervical region: Secondary | ICD-10-CM | POA: Diagnosis not present

## 2018-11-28 DIAGNOSIS — G8929 Other chronic pain: Secondary | ICD-10-CM | POA: Diagnosis not present

## 2018-11-28 DIAGNOSIS — Z4781 Encounter for orthopedic aftercare following surgical amputation: Secondary | ICD-10-CM | POA: Diagnosis not present

## 2018-11-28 DIAGNOSIS — I129 Hypertensive chronic kidney disease with stage 1 through stage 4 chronic kidney disease, or unspecified chronic kidney disease: Secondary | ICD-10-CM | POA: Diagnosis not present

## 2018-11-28 DIAGNOSIS — M5030 Other cervical disc degeneration, unspecified cervical region: Secondary | ICD-10-CM | POA: Diagnosis not present

## 2018-11-28 DIAGNOSIS — E1122 Type 2 diabetes mellitus with diabetic chronic kidney disease: Secondary | ICD-10-CM | POA: Diagnosis not present

## 2018-11-28 DIAGNOSIS — M159 Polyosteoarthritis, unspecified: Secondary | ICD-10-CM | POA: Diagnosis not present

## 2018-11-28 DIAGNOSIS — E1152 Type 2 diabetes mellitus with diabetic peripheral angiopathy with gangrene: Secondary | ICD-10-CM | POA: Diagnosis not present

## 2018-11-28 NOTE — Telephone Encounter (Signed)
Faxed back earlier today

## 2018-11-28 NOTE — Telephone Encounter (Signed)
Faxed back to home health at [phone]

## 2018-11-28 NOTE — Telephone Encounter (Signed)
lvm with candice to let her know that ut has been faxed back

## 2018-11-28 NOTE — Telephone Encounter (Signed)
Copied from CRM [id number]. Topic: General - Inquiry >> Nov 28, 2018 12:24 PM Lynne Logan D wrote: Reason for CRM: Candice with [facility] stated they faxed over a Plan of Care for pt on 11/19/18 for Dr. Fabian Sharp to fill out and send back. They have not received it back yet. Please advise. Cb#[phone] Ext [phone]

## 2018-11-29 ENCOUNTER — Ambulatory Visit: Payer: Self-pay | Admitting: *Deleted

## 2018-11-29 DIAGNOSIS — J479 Bronchiectasis, uncomplicated: Secondary | ICD-10-CM | POA: Diagnosis not present

## 2018-12-02 ENCOUNTER — Ambulatory Visit: Payer: Self-pay

## 2018-12-02 ENCOUNTER — Ambulatory Visit: Payer: Medicare HMO | Admitting: Internal Medicine

## 2018-12-02 NOTE — Telephone Encounter (Signed)
Pt canceled appointment for today. Pt stated that she is tired and sleepy and having diarrhea every 2 hours. Pt stated that her appetite is coming back. Pt is able to stand and walk with the assistance of a cane. Pt stated that she has had weakness since 2019 October. Pt stated that she is having sweating episodes, chills and aching all over, productive cough, diarrhea and occasional vomiting. Pt stated that she was thirsty and wanted to know when she could drink. Tereasa Coop at PCP office and informed him about pt calling for ongoing weakness. Jennette Banker that NT will call radiology at [facility] regarding NPO status and make her an appointment for tomorrow. Called [facility] radiology and stated that she is to be NPO after midnight. Discussed with pt that she may eat or drink until midnight then nothing bay mouth. Appointment made for pt to see Dr Fabian Sharp tomorrow at 1:15 pm. Brother is going to be her driver.   Reason for Disposition . Fatigue is a chronic symptom (recurrent or ongoing AND present > 4 weeks)  Answer Assessment - Initial Assessment Questions 1. DESCRIPTION: "Describe how you are feeling."     Tired and sleepy concerned about how much weight she has loss- appetite is coming back every 2 hours having diarrhea. 2. SEVERITY: "How bad is it?"  "Can you stand and walk?"   - MILD - Feels weak or tired, but does not interfere with work, school or normal activities   - MODERATE - Able to stand and walk; weakness interferes with work, school, or normal activities   - SEVERE - Unable to stand or walk     moderate 3. ONSET:  "When did the weakness begin?"     Since October 2019 maybe a little longer 4. CAUSE: "What do you think is causing the weakness?"     No- Rheumatoid arthritis 5. MEDICINES: "Have you recently started a new medicine or had a change in the amount of a medicine?"     A new medicine in December  6. OTHER SYMPTOMS: "Do you have any other symptoms?" (e.g., chest pain, fever, cough,  SOB, vomiting, diarrhea, bleeding, other areas of pain)     Sweating episodes, subjective fever, chills aching all over, productive cough, diarrhea, occasional vomiting 7. PREGNANCY: "Is there any chance you are pregnant?" "When was your last menstrual period?"     n/a  Protocols used: WEAKNESS (GENERALIZED) AND FATIGUE-A-AH

## 2018-12-03 ENCOUNTER — Encounter: Payer: Self-pay | Admitting: Internal Medicine

## 2018-12-03 ENCOUNTER — Ambulatory Visit (INDEPENDENT_AMBULATORY_CARE_PROVIDER_SITE_OTHER): Payer: Medicare HMO | Admitting: Internal Medicine

## 2018-12-03 ENCOUNTER — Ambulatory Visit (HOSPITAL_COMMUNITY)
Admission: RE | Admit: 2018-12-03 | Discharge: 2018-12-03 | Disposition: A | Discharge status: Home / Self Care | Payer: Medicare HMO | Source: Ambulatory Visit | Attending: Gastroenterology | Admitting: Gastroenterology

## 2018-12-03 VITALS — BP 118/64 | HR 60 | Temp 99.2°F

## 2018-12-03 DIAGNOSIS — Z79899 Other long term (current) drug therapy: Secondary | ICD-10-CM | POA: Diagnosis not present

## 2018-12-03 DIAGNOSIS — D721 Eosinophilia: Secondary | ICD-10-CM | POA: Diagnosis not present

## 2018-12-03 DIAGNOSIS — I10 Essential (primary) hypertension: Secondary | ICD-10-CM | POA: Diagnosis not present

## 2018-12-03 DIAGNOSIS — R1011 Right upper quadrant pain: Secondary | ICD-10-CM | POA: Diagnosis not present

## 2018-12-03 DIAGNOSIS — I7301 Raynaud's syndrome with gangrene: Secondary | ICD-10-CM

## 2018-12-03 DIAGNOSIS — R197 Diarrhea, unspecified: Secondary | ICD-10-CM | POA: Diagnosis not present

## 2018-12-03 DIAGNOSIS — N289 Disorder of kidney and ureter, unspecified: Secondary | ICD-10-CM

## 2018-12-03 DIAGNOSIS — R634 Abnormal weight loss: Secondary | ICD-10-CM | POA: Diagnosis not present

## 2018-12-03 DIAGNOSIS — E46 Unspecified protein-calorie malnutrition: Secondary | ICD-10-CM | POA: Diagnosis not present

## 2018-12-03 DIAGNOSIS — R945 Abnormal results of liver function studies: Secondary | ICD-10-CM | POA: Diagnosis not present

## 2018-12-03 LAB — CBC WITH DIFFERENTIAL/PLATELET
Basophils Absolute: 0.1 10*3/uL (ref 0.0–0.1)
Basophils Relative: 1 % (ref 0.0–3.0)
Eosinophils Absolute: 2.2 10*3/uL — ABNORMAL HIGH (ref 0.0–0.7)
Eosinophils Relative: 36.1 % — ABNORMAL HIGH (ref 0.0–5.0)
HCT: 33.9 % — ABNORMAL LOW (ref 36.0–46.0)
Hemoglobin: 11.2 g/dL — ABNORMAL LOW (ref 12.0–15.0)
Lymphocytes Relative: 34.6 % (ref 12.0–46.0)
Lymphs Abs: 2.1 10*3/uL (ref 0.7–4.0)
MCHC: 32.9 g/dL (ref 30.0–36.0)
MCV: 89.7 fl (ref 78.0–100.0)
Monocytes Absolute: 0.5 10*3/uL (ref 0.1–1.0)
Monocytes Relative: 8.4 % (ref 3.0–12.0)
Neutro Abs: 1.2 10*3/uL — ABNORMAL LOW (ref 1.4–7.7)
Neutrophils Relative %: 19.9 % — ABNORMAL LOW (ref 43.0–77.0)
Platelets: 331 10*3/uL (ref 150.0–400.0)
RBC: 3.78 Mil/uL — ABNORMAL LOW (ref 3.87–5.11)
RDW: 16.7 % — ABNORMAL HIGH (ref 11.5–15.5)
WBC: 6.1 10*3/uL (ref 4.0–10.5)

## 2018-12-03 LAB — HEPATIC FUNCTION PANEL
ALT: 20 U/L (ref 0–35)
AST: 54 U/L — ABNORMAL HIGH (ref 0–37)
Albumin: 3 g/dL — ABNORMAL LOW (ref 3.5–5.2)
Alkaline Phosphatase: 65 U/L (ref 39–117)
Bilirubin, Direct: 0.1 mg/dL (ref 0.0–0.3)
Total Bilirubin: 0.3 mg/dL (ref 0.2–1.2)
Total Protein: 8.6 g/dL — ABNORMAL HIGH (ref 6.0–8.3)

## 2018-12-03 LAB — SEDIMENTATION RATE: Sed Rate: 129 mm/hr — ABNORMAL HIGH (ref 0–30)

## 2018-12-03 LAB — BASIC METABOLIC PANEL
BUN: 48 mg/dL — ABNORMAL HIGH (ref 6–23)
CO2: 22 mEq/L (ref 19–32)
Calcium: 8.7 mg/dL (ref 8.4–10.5)
Chloride: 97 mEq/L (ref 96–112)
Creatinine, Ser: 2.26 mg/dL — ABNORMAL HIGH (ref 0.40–1.20)
GFR: 26.14 mL/min — ABNORMAL LOW (ref >60.00)
Glucose, Bld: 89 mg/dL (ref 70–99)
Potassium: 4.1 mEq/L (ref 3.5–5.1)
Sodium: 130 mEq/L — ABNORMAL LOW (ref 135–145)

## 2018-12-03 LAB — C-REACTIVE PROTEIN: CRP: 1.7 mg/dL (ref 0.5–20.0)

## 2018-12-03 LAB — MAGNESIUM: Magnesium: 2 mg/dL (ref 1.5–2.5)

## 2018-12-03 MED ORDER — TECHNETIUM TC 99M MEBROFENIN IV KIT
5.0000 | PACK | Freq: Once | INTRAVENOUS | Status: AC | PRN
  Administered 2018-12-03: 5 via INTRAVENOUS
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
## 2018-12-03 NOTE — Patient Instructions (Addendum)
Checking for infection but the diarrhea may be from  Other causes  Medicines  Etc .   Plan check stool tests and  Lab today and then share with Dr Loreta Ave and your team . I am concerned that your weight loss  Is continuing .  Adding to your weakness feeling.  . If your blood pressure gets too low we may need to   Decrease your bp medications.  Am g oing to  Flag tyour other team members for other options.

## 2018-12-04 ENCOUNTER — Other Ambulatory Visit: Payer: Self-pay | Admitting: *Deleted

## 2018-12-04 ENCOUNTER — Ambulatory Visit: Payer: Medicare HMO | Admitting: Cardiovascular Disease

## 2018-12-04 ENCOUNTER — Other Ambulatory Visit: Payer: Self-pay

## 2018-12-04 DIAGNOSIS — E785 Hyperlipidemia, unspecified: Secondary | ICD-10-CM

## 2018-12-04 DIAGNOSIS — R197 Diarrhea, unspecified: Secondary | ICD-10-CM

## 2018-12-04 NOTE — Patient Outreach (Signed)
[facility]) Care Management  12/04/2018  Desiree [name] [birth year] [id number]   Call received from member requesting assistance with transportation.  State she is no longer able to drive as much as she had been.  Brother provides transportation at times, but report she is in need of other options.  Will contact BSW to inquire about community resources in place.  This care manager inquired about member's overall health.  State she will potentially need more more finger amputations and could possibly need surgery to remove her gall bladder.  Report she has follow up appointments within the couple weeks to determine plan of care.  Denies any urgent concerns at this time, will follow up within the next 2 weeks.  Kemper Durie, [place], MSN [facility] Management  [facility] Manager [phone]

## 2018-12-04 NOTE — Progress Notes (Signed)
Patient Care Team: [name], [name], MD as PCP - General (Internal Medicine) Josue Hector, MD as PCP - Cardiology (Cardiology) [name], MD (Orthopedic Surgery) Cottle, Billey Co., MD as Attending Physician (Psychiatry) Comer Locket, PA-C as Physician Assistant (Physician Assistant) [name], MD as Consulting Physician (Gastroenterology) Elmarie Shiley, MD as Consulting Physician (Nephrology) Druscilla Brownie, MD as Consulting Physician (Dermatology) Arvella Nigh, MD as Consulting Physician (Obstetrics and Gynecology) Gean Birchwood, DPM as Consulting Physician (Podiatry) [name], MD as Consulting Physician (Pulmonary Disease) Bo Merino, MD as Consulting Physician (Rheumatology) Valente David, RN as [place] Management Nicholas Lose, MD as Consulting Physician (Hematology and Oncology)  DIAGNOSIS:    ICD-10-CM   1. 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 with above-mentioned history of eosinophilia. She presents to the clinic today to review recent blood work and determine if a bone marrow biopsy is neccessary. Her most recent labs from today show WBC 14.2, Hg 10.9, platelets 590, eosinophils 8.6. She presents to the clinic alone today. She has recently presented to the ED twice for gangrene in her finger that is worse when it is cold. She reports it will be surgically removed next week.   REVIEW OF SYSTEMS:   Constitutional: Denies fevers, chills or abnormal weight loss Eyes: Denies blurriness of vision Ears, nose, mouth, throat, and face: Denies mucositis or sore throat Respiratory: Denies cough, dyspnea or wheezes Cardiovascular: Denies palpitation, chest discomfort Gastrointestinal: Denies nausea, heartburn or change in bowel habits Skin: Denies abnormal skin rashes MSK: (+) gangrene in fingers Lymphatics: Denies new lymphadenopathy or easy  bruising Neurological: Denies numbness, tingling or new weaknesses Behavioral/Psych: Mood is stable, no new changes  Extremities: No lower extremity edema All other systems were reviewed with the patient and are negative.  I have reviewed the past medical history, past surgical history, social history and family history with the patient and they are unchanged from previous note.  ALLERGIES:  is allergic to prednisone; solu-medrol [methylprednisolone]; amoxicillin; codeine; hydrocodone-acetaminophen; penicillins; and tape.  MEDICATIONS:  Current Outpatient Medications  Medication Sig Dispense Refill  . acetaminophen (TYLENOL) 500 MG tablet Take 2 tablets (1,000 mg total) by mouth every 6 (six) hours as needed. (Patient taking differently: Take 1,000 mg by mouth every 6 (six) hours as needed for mild pain or headache. ) 30 tablet 0  . amLODipine (NORVASC) 10 MG tablet Take 1 tablet (10 mg total) by mouth daily. 30 tablet 0  . ANORO ELLIPTA 62.5-25 MCG/INH AEPB INHALE 1 PUFF ONCE DAILY (Patient taking differently: Inhale 1 puff into the lungs daily. ) 60 each 5  . aspirin EC 81 MG tablet Take by mouth.    Marland Kitchen atorvastatin (LIPITOR) 20 MG tablet TAKE 1 TABLET BY MOUTH ONCE DAILY (Patient taking differently: Take 20 mg by mouth daily. ) 90 tablet 0  . Blood Glucose Monitoring Suppl (ACCU-CHEK NANO SMARTVIEW) W/DEVICE KIT Use to check blood sugar 1-2 times daily (Patient taking differently: 1 strip by Other route 2 (two) times a week. ) 1 kit 0  . cetirizine (ZYRTEC) 10 MG tablet Take 10 mg by mouth at bedtime.     . diclofenac sodium (VOLTAREN) 1 % GEL Apply 3 grams to 3 large joints, up to 3 times daily. (Patient taking differently: Apply 3 g topically See admin instructions. Apply 3 grams to three large joints, up to three times a day)  3 Tube 3  . docusate sodium (COLACE) 100 MG capsule Take 100 mg by mouth at bedtime.     . DULoxetine (CYMBALTA) 60 MG capsule Take 1 capsule (60 mg total) by mouth  daily. 30 capsule 3  . feeding supplement, ENSURE ENLIVE, (ENSURE ENLIVE) LIQD Take 237 mLs by mouth 3 (three) times daily between meals. (Patient taking differently: Take 237 mLs by mouth 2 (two) times daily after a meal. ) 14 Bottle 0  . fluticasone (FLONASE) 50 MCG/ACT nasal spray Place 1-2 sprays into both nostrils at bedtime.     Marland Kitchen glucose blood test strip Accu-chek nano, test glucose once daily, dx E11.9 100 each 12  . guaiFENesin (MUCINEX) 600 MG 12 hr tablet Take 600 mg by mouth at bedtime.    . hydroxychloroquine (PLAQUENIL) 200 MG tablet TAKE 1 TABLET BY MOUTH TWICE DAILY, MONDAY-FRIDAY. NONE ON SATURDAY OR SUNDAY (Patient taking differently: Take 200 mg by mouth 2 (two) times daily. Monday through Friday, take none on Saturday or Sunday.) 40 tablet 0  . ipratropium-albuterol (DUONEB) 0.5-2.5 (3) MG/3ML SOLN Take 3 mLs by nebulization 3 (three) times daily. 360 mL 11  . lamoTRIgine (LAMICTAL) 25 MG tablet Take 1 tablet (25 mg total) by mouth 3 (three) times daily. 90 tablet 1  . Lancets Misc. (ACCU-CHEK FASTCLIX LANCET) KIT USE TO CHECK BLOOD SUGAR 1-2 TIMES DAILY Dx E11.9 1 kit 1  . linaclotide (LINZESS) 145 MCG CAPS capsule Take 145 mcg by mouth daily as needed (constipation).    Marland Kitchen losartan (COZAAR) 50 MG tablet Take 50 mg by mouth daily.     . montelukast (SINGULAIR) 10 MG tablet TAKE 1 TABLET BY MOUTH AT BEDTIME (Patient taking differently: Take 10 mg by mouth at bedtime. ) 90 tablet 0  . Multiple Vitamins-Minerals (CENTRUM SILVER 50+WOMEN) TABS Take 1 tablet by mouth daily.    . nitroGLYCERIN (NITRO-BID) 2 % ointment APPLY 1/4 INCH TOPICALLY TO AFFECTED FINGERTIPS THREE TIMES DAILY (Patient taking differently: Apply 0.25 inches topically 3 (three) times daily. To affected fingertips) 30 g 0  . NON FORMULARY Respivest: Three times a day for 30 minutes    . ondansetron (ZOFRAN-ODT) 4 MG disintegrating tablet DISSOLVE 1 TABLET(4 MG) ON THE TONGUE EVERY 8 HOURS AS NEEDED FOR NAUSEA OR  VOMITING (Patient taking differently: Take 4 mg by mouth every 8 (eight) hours as needed for nausea or vomiting. ) 20 tablet 0  . OVER THE COUNTER MEDICATION Take 1 capsule by mouth See admin instructions. Fdgard capsules: Take 1 capsule by mouth once a day    . oxyCODONE (ROXICODONE) 5 MG immediate release tablet Take 1 tablet (5 mg total) by mouth every 6 (six) hours as needed for severe pain. 2 tablet 0  . pantoprazole (PROTONIX) 40 MG tablet Take 40 mg by mouth daily.    . Probiotic Product (ALIGN) 4 MG CAPS Take 4 mg by mouth daily.    Marland Kitchen Respiratory Therapy Supplies (FLUTTER) DEVI 1 Device by Does not apply route as directed. (Patient taking differently: 1 Device as directed. ) 1 each 0  . sodium chloride HYPERTONIC 3 % nebulizer solution Take by nebulization 2 (two) times daily. (Patient taking differently: Take 4 mLs by nebulization 2 (two) times daily. ) 750 mL 5  . triamcinolone cream (KENALOG) 0.1 % Apply 1 application topically daily as needed (for itching of affected areas).   0   No current facility-administered medications for this visit.     PHYSICAL EXAMINATION: ECOG PERFORMANCE STATUS:  1 - Symptomatic but completely ambulatory  There were no vitals filed for this visit. There were no vitals filed for this visit.  GENERAL: alert, no distress and comfortable SKIN: skin color, texture, turgor are normal, no rashes or significant lesions EYES: normal, Conjunctiva are pink and non-injected, sclera clear OROPHARYNX: no exudate, no erythema and lips, buccal mucosa, and tongue normal  NECK: supple, thyroid normal size, non-tender, without nodularity LYMPH: no palpable lymphadenopathy in the cervical, axillary or inguinal LUNGS: clear to auscultation and percussion with normal breathing effort HEART: regular rate & rhythm and no murmurs and no lower extremity edema ABDOMEN: abdomen soft, non-tender and normal bowel sounds MUSCULOSKELETAL: no cyanosis of digits and no clubbing   NEURO: alert & oriented x 3 with fluent speech, no focal motor/sensory deficits EXTREMITIES: No lower extremity edema  LABORATORY DATA:  I have reviewed the data as listed CMP Latest Ref Rng & Units 12/03/2018 11/07/2018 10/22/2018  Glucose 70 - 99 mg/dL 89 91 85  BUN 6 - 23 mg/dL 48(H) 21 19  Creatinine 0.40 - 1.20 mg/dL 2.26(H) 1.76(H) 1.30(H)  Sodium 135 - 145 mEq/L 130(L) 140 139  Potassium 3.5 - 5.1 mEq/L 4.1 3.8 4.2  Chloride 96 - 112 mEq/L 97 103 105  CO2 19 - 32 mEq/L _0 Calcium 8.4 - 10.5 mg/dL 8.7 8.7(L) 8.6(L)  Total Protein 6.0 - 8.3 g/dL 8.6(H) - 8.4(H)  Total Bilirubin 0.2 - 1.2 mg/dL 0.3 - 0.5  Alkaline Phos 39 - 117 U/L 65 - 107  AST 0 - 37 U/L 54(H) - 38  ALT 0 - 35 U/L 20 - 20    Lab Results  Component Value Date   WBC 14.2 (H) 12/10/2018   HGB 10.9 (L) 12/10/2018   HCT 35.0 (L) 12/10/2018   MCV 94.3 12/10/2018   PLT 590 (H) 12/10/2018   NEUTROABS 2.4 12/10/2018    ASSESSMENT & PLAN:  Eosinophilia Multiple comorbidities and illnesses with eosinophilia Absolute eosinophil count has decreased to 2200 from an earlier high of 19,000 Today once again the white blood cell count increased to 14.2 and the absolute eosinophil count has also increased  This clearly indicates that eosinophilia is reactive and that there is no primary bone marrow disorder like hypereosinophilic syndrome.  We are happy to assist her in the future if any other issues arise from hematology standpoint. Patient will need to continue to follow with her rheumatologist and primary care physician for her ongoing issues. Return to clinic on an as-needed basis   No orders of the defined types were placed in this encounter.  The patient has a good understanding of the overall plan. she agrees with it. she will call with any problems that may develop before the next visit here.  Nicholas Lose, MD 12/10/2018  Julious Oka Dorshimer am acting as scribe for Dr. Nicholas Lose.  I have  reviewed the above documentation for accuracy and completeness, and I agree with the above.

## 2018-12-05 ENCOUNTER — Ambulatory Visit (INDEPENDENT_AMBULATORY_CARE_PROVIDER_SITE_OTHER): Payer: Medicare HMO | Admitting: Rheumatology

## 2018-12-05 ENCOUNTER — Encounter: Payer: Self-pay | Admitting: Rheumatology

## 2018-12-05 VITALS — BP 122/70 | HR 64 | Resp 13 | Ht 62.5 in | Wt 107.0 lb

## 2018-12-05 DIAGNOSIS — D721 Eosinophilia, unspecified: Secondary | ICD-10-CM

## 2018-12-05 DIAGNOSIS — M19071 Primary osteoarthritis, right ankle and foot: Secondary | ICD-10-CM | POA: Diagnosis not present

## 2018-12-05 DIAGNOSIS — M19041 Primary osteoarthritis, right hand: Secondary | ICD-10-CM | POA: Diagnosis not present

## 2018-12-05 DIAGNOSIS — R768 Other specified abnormal immunological findings in serum: Secondary | ICD-10-CM

## 2018-12-05 DIAGNOSIS — R296 Repeated falls: Secondary | ICD-10-CM

## 2018-12-05 DIAGNOSIS — I7301 Raynaud's syndrome with gangrene: Secondary | ICD-10-CM

## 2018-12-05 DIAGNOSIS — J479 Bronchiectasis, uncomplicated: Secondary | ICD-10-CM

## 2018-12-05 DIAGNOSIS — M170 Bilateral primary osteoarthritis of knee: Secondary | ICD-10-CM | POA: Diagnosis not present

## 2018-12-05 DIAGNOSIS — Z79899 Other long term (current) drug therapy: Secondary | ICD-10-CM | POA: Diagnosis not present

## 2018-12-05 DIAGNOSIS — E782 Mixed hyperlipidemia: Secondary | ICD-10-CM

## 2018-12-05 DIAGNOSIS — M5136 Other intervertebral disc degeneration, lumbar region: Secondary | ICD-10-CM | POA: Diagnosis not present

## 2018-12-05 DIAGNOSIS — M7541 Impingement syndrome of right shoulder: Secondary | ICD-10-CM

## 2018-12-05 DIAGNOSIS — M19072 Primary osteoarthritis, left ankle and foot: Secondary | ICD-10-CM

## 2018-12-05 DIAGNOSIS — Z8639 Personal history of other endocrine, nutritional and metabolic disease: Secondary | ICD-10-CM

## 2018-12-05 DIAGNOSIS — M19042 Primary osteoarthritis, left hand: Secondary | ICD-10-CM

## 2018-12-05 DIAGNOSIS — Z8659 Personal history of other mental and behavioral disorders: Secondary | ICD-10-CM

## 2018-12-05 DIAGNOSIS — M5030 Other cervical disc degeneration, unspecified cervical region: Secondary | ICD-10-CM | POA: Diagnosis not present

## 2018-12-05 DIAGNOSIS — Z87891 Personal history of nicotine dependence: Secondary | ICD-10-CM

## 2018-12-05 DIAGNOSIS — I10 Essential (primary) hypertension: Secondary | ICD-10-CM

## 2018-12-06 DIAGNOSIS — R269 Unspecified abnormalities of gait and mobility: Secondary | ICD-10-CM | POA: Diagnosis not present

## 2018-12-06 DIAGNOSIS — J479 Bronchiectasis, uncomplicated: Secondary | ICD-10-CM | POA: Diagnosis not present

## 2018-12-06 LAB — C. DIFFICILE GDH AND TOXIN A/B

## 2018-12-06 LAB — STOOL CULTURE

## 2018-12-06 LAB — GIARDIA/CRYPTOSPORIDIUM (EIA)

## 2018-12-06 LAB — PREALBUMIN: Prealbumin: 14 mg/dL — ABNORMAL LOW (ref 17–34)

## 2018-12-09 ENCOUNTER — Ambulatory Visit: Payer: Self-pay | Admitting: *Deleted

## 2018-12-09 DIAGNOSIS — H40021 Open angle with borderline findings, high risk, right eye: Secondary | ICD-10-CM | POA: Diagnosis not present

## 2018-12-09 DIAGNOSIS — H47012 Ischemic optic neuropathy, left eye: Secondary | ICD-10-CM | POA: Diagnosis not present

## 2018-12-09 DIAGNOSIS — R260 Ataxic gait: Secondary | ICD-10-CM | POA: Diagnosis not present

## 2018-12-09 DIAGNOSIS — H25013 Cortical age-related cataract, bilateral: Secondary | ICD-10-CM | POA: Diagnosis not present

## 2018-12-10 ENCOUNTER — Other Ambulatory Visit: Payer: Self-pay

## 2018-12-10 ENCOUNTER — Inpatient Hospital Stay: Payer: Medicare HMO | Attending: Hematology and Oncology

## 2018-12-10 ENCOUNTER — Inpatient Hospital Stay (HOSPITAL_BASED_OUTPATIENT_CLINIC_OR_DEPARTMENT_OTHER): Payer: Medicare HMO | Admitting: Hematology and Oncology

## 2018-12-10 ENCOUNTER — Telehealth: Payer: Self-pay

## 2018-12-10 DIAGNOSIS — D721 Eosinophilia, unspecified: Secondary | ICD-10-CM

## 2018-12-10 LAB — CBC WITH DIFFERENTIAL (CANCER CENTER ONLY)
Abs Immature Granulocytes: 0.01 10*3/uL (ref 0.00–0.07)
Basophils Absolute: 0.2 10*3/uL — ABNORMAL HIGH (ref 0.0–0.1)
Basophils Relative: 1 %
Eosinophils Absolute: 8.6 10*3/uL — ABNORMAL HIGH (ref 0.0–0.5)
Eosinophils Relative: 60 %
HCT: 35 % — ABNORMAL LOW (ref 36.0–46.0)
Hemoglobin: 10.9 g/dL — ABNORMAL LOW (ref 12.0–15.0)
Immature Granulocytes: 0 %
Lymphocytes Relative: 18 %
Lymphs Abs: 2.5 10*3/uL (ref 0.7–4.0)
MCH: 29.4 pg (ref 26.0–34.0)
MCHC: 31.1 g/dL (ref 30.0–36.0)
MCV: 94.3 fL (ref 80.0–100.0)
Monocytes Absolute: 0.5 10*3/uL (ref 0.1–1.0)
Monocytes Relative: 4 %
Neutro Abs: 2.4 10*3/uL (ref 1.7–7.7)
Neutrophils Relative %: 17 %
Platelet Count: 590 10*3/uL — ABNORMAL HIGH (ref 150–400)
RBC: 3.71 MIL/uL — ABNORMAL LOW (ref 3.87–5.11)
RDW: 16.8 % — ABNORMAL HIGH (ref 11.5–15.5)
WBC Count: 14.2 10*3/uL — ABNORMAL HIGH (ref 4.0–10.5)
nRBC: 0 % (ref 0.0–0.2)

## 2018-12-10 NOTE — Telephone Encounter (Signed)
Spoke with pt and she reports she fell several days ago, she fell off the couch and hit her head on the floor - mainly her right temple area. She denies any wounds, she denies any LOC but did have an episode of emesis that day however does not recall if it was before or after the incident. She does report area is sore to the touch. She states she is scheduled to have a CT scan done ordered by her eye doctor but is not sure of the date. She does report she feels more unsteady lately, she is using a cane and walker to assist with ambulation. She is concerned about ongoing weight loss and reports her "clothes are falling off".   Dr. Fabian Sharp - Please advise. Thanks!

## 2018-12-10 NOTE — Telephone Encounter (Signed)
Copied from CRM [id number]. Topic: General - Other >> Dec 10, 2018  1:53 PM Mcneil, Ja-Kwan wrote: Reason for CRM: Pt stated she fell and hit her temple on the right side. Offered to schedule an appt  but pt declined. Pt just wanted to make Dr. Fabian Sharp aware that she had fell and hit her temple.

## 2018-12-10 NOTE — Assessment & Plan Note (Signed)
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  We are happy to assist her in the future if any other issues arise from hematology standpoint. Patient will need to continue to follow with her rheumatologist and primary care physician for her ongoing issues.

## 2018-12-11 ENCOUNTER — Telehealth: Payer: Self-pay | Admitting: Hematology and Oncology

## 2018-12-11 NOTE — Telephone Encounter (Signed)
No los °

## 2018-12-12 ENCOUNTER — Ambulatory Visit: Payer: Medicare HMO | Admitting: Pulmonary Disease

## 2018-12-13 DIAGNOSIS — G939 Disorder of brain, unspecified: Secondary | ICD-10-CM | POA: Diagnosis not present

## 2018-12-13 DIAGNOSIS — R930 Abnormal findings on diagnostic imaging of skull and head, not elsewhere classified: Secondary | ICD-10-CM | POA: Diagnosis not present

## 2018-12-13 DIAGNOSIS — R9082 White matter disease, unspecified: Secondary | ICD-10-CM | POA: Diagnosis not present

## 2018-12-13 DIAGNOSIS — Z8673 Personal history of transient ischemic attack (TIA), and cerebral infarction without residual deficits: Secondary | ICD-10-CM | POA: Diagnosis not present

## 2018-12-14 ENCOUNTER — Other Ambulatory Visit: Payer: Self-pay | Admitting: Pulmonary Disease

## 2018-12-16 DIAGNOSIS — J479 Bronchiectasis, uncomplicated: Secondary | ICD-10-CM | POA: Diagnosis not present

## 2018-12-16 DIAGNOSIS — R269 Unspecified abnormalities of gait and mobility: Secondary | ICD-10-CM | POA: Diagnosis not present

## 2018-12-17 DIAGNOSIS — M86241 Subacute osteomyelitis, right hand: Secondary | ICD-10-CM | POA: Diagnosis not present

## 2018-12-17 DIAGNOSIS — M898X4 Other specified disorders of bone, hand: Secondary | ICD-10-CM | POA: Diagnosis not present

## 2018-12-17 DIAGNOSIS — I96 Gangrene, not elsewhere classified: Secondary | ICD-10-CM | POA: Diagnosis not present

## 2018-12-20 ENCOUNTER — Other Ambulatory Visit: Payer: Self-pay | Admitting: Surgery

## 2018-12-20 ENCOUNTER — Ambulatory Visit: Payer: Medicare HMO | Admitting: Psychiatry

## 2018-12-20 ENCOUNTER — Telehealth: Payer: Self-pay | Admitting: Internal Medicine

## 2018-12-20 DIAGNOSIS — R1084 Generalized abdominal pain: Secondary | ICD-10-CM | POA: Diagnosis not present

## 2018-12-20 DIAGNOSIS — G8929 Other chronic pain: Secondary | ICD-10-CM | POA: Diagnosis not present

## 2018-12-20 NOTE — Telephone Encounter (Signed)
Copied from CRM [id number]. Topic: Quick Communication - See Telephone Encounter >> Dec 20, 2018  5:04 PM Lorrine Kin, NT wrote: CRM for notification. See Telephone encounter for: 12/20/18. Patient calling and states that she was advised by her gastroenterology doctor to let Dr Fabian Sharp know that there was nothing wrong with her gall bladder/gall stones. Patient would like a call to discuss.  CB#: [phone]

## 2018-12-25 NOTE — Telephone Encounter (Signed)
I would like to talk with her gi doctor so I will need to call .     How is her diarrhea   Vomiting and weight doing?

## 2018-12-26 ENCOUNTER — Encounter: Payer: Self-pay | Admitting: Pulmonary Disease

## 2018-12-26 ENCOUNTER — Ambulatory Visit (INDEPENDENT_AMBULATORY_CARE_PROVIDER_SITE_OTHER): Payer: Medicare HMO | Admitting: Pulmonary Disease

## 2018-12-26 ENCOUNTER — Telehealth: Payer: Self-pay | Admitting: Rheumatology

## 2018-12-26 VITALS — BP 84/52 | HR 105 | Temp 98.3°F | Ht 62.5 in | Wt 116.4 lb

## 2018-12-26 DIAGNOSIS — I7301 Raynaud's syndrome with gangrene: Secondary | ICD-10-CM

## 2018-12-26 DIAGNOSIS — J449 Chronic obstructive pulmonary disease, unspecified: Secondary | ICD-10-CM | POA: Diagnosis not present

## 2018-12-26 DIAGNOSIS — I951 Orthostatic hypotension: Secondary | ICD-10-CM

## 2018-12-26 DIAGNOSIS — Z0000 Encounter for general adult medical examination without abnormal findings: Secondary | ICD-10-CM | POA: Diagnosis not present

## 2018-12-26 DIAGNOSIS — J479 Bronchiectasis, uncomplicated: Secondary | ICD-10-CM | POA: Diagnosis not present

## 2018-12-26 MED ORDER — UMECLIDINIUM-VILANTEROL 62.5-25 MCG/INH IN AEPB: INHALATION_SPRAY | RESPIRATORY_TRACT | 5 refills | Status: DC

## 2018-12-26 MED ORDER — UMECLIDINIUM-VILANTEROL 62.5-25 MCG/INH IN AEPB: 1.0000 | INHALATION_SPRAY | Freq: Every day | RESPIRATORY_TRACT | 0 refills | Status: AC

## 2018-12-26 NOTE — Assessment & Plan Note (Signed)
Assessment: Status post amputation  Plan: Continue follow-up with rheumatology

## 2018-12-26 NOTE — Telephone Encounter (Signed)
Patient left a message asking for a call back to let her know what medication doctor wanted her to stop taking at last visit. Also, please just verify patients next scheduled appt with her.

## 2018-12-26 NOTE — Telephone Encounter (Signed)
Patient advised according to last office note she is stop PLQ. Patient advised her next appointment is 03/11/19 at 2:00 pm.

## 2018-12-26 NOTE — Telephone Encounter (Signed)
lvm for pt to call back okay for nurse triage to disclose

## 2018-12-26 NOTE — Telephone Encounter (Signed)
Call to patient- patient reports her diarrhea and vomiting has improved some- she is not having it as much. She is able to keep food and fluids down. She does report she is having discomfort from her GERD and her medication is not helping much. Patient reports she is not on a particular diet- she eats whatever she feels like she can tolerate. Patient states she has been fluctuating in her weight- 110-114 lb- but it could be variances in the different scales. She states she has purchased a new scale and will be able to monitor her weight on the same scale now.   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 She does have follow up appointment on Tuesday.  Patient also wants more information about Raynaud's and arthritis.  Patient wants to make sure that Dr Fabian Sharp has seen her notes from her visit to the surgeon- she is pleased he did not recommend surgery at this time.

## 2018-12-26 NOTE — Assessment & Plan Note (Signed)
Assessment: Nonadherent to an oral Ellipta mMRC 3 today Lungs clear to auscultation today  Plan: Resume Anoro Ellipta Samples of Anoro Ellipta provided to patient today [employer] for you pharmaceutical assistant paperwork provided today

## 2018-12-26 NOTE — Patient Instructions (Addendum)
Restart Anoro Ellipta  >>> Take 1 puff daily in the morning right when you wake up >>>Rinse your mouth out after use >>>This is a daily maintenance inhaler, NOT a rescue inhaler >>>Contact our office if you are having difficulties affording or obtaining this medication >>>It is important for you to be able to take this daily and not miss any doses >>> Sample provided today  Fill out the [employer] for you pharmaceutical assistance paperwork this is to see if he can qualify for Anoro Ellipta coverage  Continue hypertonic saline nebs twice daily before using your therapy vest  Bronchiectasis: This is the medical term which indicates that you have damage, dilated airways making you more susceptible to respiratory infection. Use Therapy Vest twice daily  Use a flutter valve 10 breaths twice a day or 4 to 5 breaths 4-5 times a day to help clear mucus out Let us know if you have cough with change in mucus color or fevers or chills.  At that point you would need an antibiotic. Maintain a healthy nutritious diet, eating whole foods Take your medications as prescribed   Keep follow-up with primary care and let them know about your low blood pressures at office visit today  Keep follow-up with rheumatology Per rheumatology note you are to be off of Plaquenil   Follow-up with our office in 2 to 4 weeks to ensure we are back on track with management of bronchiectasis as well as COPD.  >>> Follow-up with Dr. Kendrick Fries preferably, if no availability then schedule with Elisha Headland, FNP   It is flu season:   >>> Best ways to protect herself from the flu: Receive the yearly flu vaccine, practice good hand hygiene washing with soap and also using hand sanitizer when available, eat a nutritious meals, get adequate rest, hydrate appropriately   Please contact the office if your symptoms worsen or you have concerns that you are not improving.   Thank you for choosing [facility] for your healthcare,  and for allowing [place] to partner with you on your healthcare journey. I am thankful to be able to provide care to you today.   Elisha Headland FNP-C

## 2018-12-26 NOTE — Assessment & Plan Note (Signed)
Assessment: Soft blood pressures on exam today Patient admits she has occasional dizziness and lightheadedness but she is not having those today in our office  Plan: Follow-up with primary care regarding the symptoms

## 2018-12-26 NOTE — Assessment & Plan Note (Signed)
Assessment: Patient has multiple specialist providers that she sees regularly  Plan: Continue regular follow-up with your specialist as planned Continue close follow-up with primary care Keep close follow-up with our office and return in 2 to 4 weeks to ensure we have adherence to treatment plans for COPD as well as bronchiectasis

## 2018-12-26 NOTE — Telephone Encounter (Signed)
Pt called. Pt would like a call back from Nurse Triage.

## 2018-12-26 NOTE — Assessment & Plan Note (Signed)
Assessment: Patient is nonadherent to bronchiectasis treatment plan BMI 20.9 Lungs clear to auscultation  Plan: Resume hypertonic saline nebs twice daily Resume therapy vest use Resume flutter valve use in place of therapy vest if you forget to use the therapy vest Continue Mucinex Continue to eat nutritious meals and hydrate appropriately Follow-up with our office in 2 to 4 weeks

## 2018-12-26 NOTE — Progress Notes (Signed)
_0  ID: Desiree [name], female    DOB: [birth year], 68 y.o.   MRN: [id number]  Chief Complaint  Patient presents with  . Follow-up    follow up on cough - some improvement    Referring provider: [name], MD  HPI:  68 year old female former smoker followed in our office for bronchiectasis and COPD. Pt with Severe Raynaud's with gangrene - being followed by Dr. [name]. 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 Lenon Curt  PMH: GERD, hypertrophic cardiomyopathy, polyarthritis with positive rheumatoid factor Smoker/ Smoking History: Former smoker.  Quit 2003.  56-pack-year smoking history Maintenance: Anoro Ellipta Pt of: Dr. Lake Bells  12/26/2018  - Visit   68 year old female former smoker (quit 2003) presenting to our office today for follow-up visit.  Patient was last seen in our office in December/2019.  This is the office visit that she was was to have 6 weeks after the December 2019 OV.  The patient admits that she has been quite busy with other specialist appointments.  I can see that through her chart review.  She is completed follow-up with hematology and they declined doing a bone marrow biopsy.  She also has completed follow-up with rheumatology they have stopped her Plaquenil.  Still no known cause for her severe raynauds.  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  Patient is followed in our office for bronchiectasis as well as COPD.  She is typically maintained on Anoro Ellipta.  She admits that she has not been adherent to taking this.  She had also admits that sometimes this is due to cost but usually this is just due to her forgetting and not taking it as prescribed.  She reports she has not used Norco in the last week.  She reports that she is used it maybe 4 or 5 times over the last month.  Patient also supposed to be using hypertonic saline nebs and a therapy vest twice daily for  management of bronchiectasis.  She reports she has not been doing this either.  She last time she used her therapy vest was about 6 days ago.  She is confused on the process of when to use the nebulizer is at before or after the therapy vest.  We will clarify today.  Patient does have a flutter valve in her purse but she admits she has not use this in the last week.  MMRC - Breathlessness Score 3 - I stop for breath after walking about 100 yards or after a few minutes on level ground (isle at grocery store is 141f)     Tests:   Labs: December 2018 rheumatoid factor elevated at 127, CCP negative December 2018 serum IgE slightly elevated at 120 kU/L September 2019 ANCA panel negative, September 2019 serum IgE 1224, Aspergillus specific IgE negative November 2019 ANA negative, ENA smooth muscle negative, complements normal, RNP slightly +1.2, SSA/SSB negative, SCL 70 negative  Chest imaging: August 22, 2017 images  showing normal pulmonary lung fields.  January 2019 high-resolution CT chest images  showing no emphysema, there is some mild bronchiectasis and mucous plugging in the bases of both lungs bilaterally September 2019 CT chest images independently reviewed showing significant airway thickening, mucous plugging, some atelectasis in the right base worse than left  Other imaging: February 2019 barium swallow showed premature spill into the piriform sinuses with thin liquid but no evidence of aspiration  Echocardiogram: June 27, 2017 transthoracic US echocardiogram LVEF 65-70%, severe asymmetric septal hypertrophy grade 1 diastolic dysfunction, right ventricle cavity size normal, systolic function normal  PFT: November 2018 spirometry normal January 2019 ratio normal, FEV1 1.36 L 74% predicted, FVC 1.79 L 76% predicted, total lung capacity 6.19 L 128% predicted, residual volume 79% predicted, DLCO 13.3 (59% predicted) flow volume loop is consistent with small airways  obstruction  FENO:  No results found for: NITRICOXIDE  PFT: PFT Results Latest Ref Rng & Units 10/24/2017  FVC-Pre L 1.87  FVC-Predicted Pre % 79  FVC-Post L 1.79  FVC-Predicted Post % 76  Pre FEV1/FVC % % 72  Post FEV1/FCV % % 76  FEV1-Pre L 1.34  FEV1-Predicted Pre % 73  FEV1-Post L 1.36  DLCO UNC% % 59  DLCO COR %Predicted % 87  TLC L 6.19  TLC % Predicted % 128  RV % Predicted % 179    Imaging: Nm Hepato W/eject Fract  Result Date: 12/03/2018 CLINICAL DATA:  RIGHT upper quadrant abdominal pain, diarrhea, weakness, upper abdominal pain EXAM: NUCLEAR MEDICINE HEPATOBILIARY IMAGING WITH GALLBLADDER EF TECHNIQUE: Sequential images of the abdomen were obtained out to 60 minutes following intravenous administration of radiopharmaceutical. After oral ingestion of Ensure, gallbladder ejection fraction was determined. At 60 min, normal ejection fraction is greater than 33%. RADIOPHARMACEUTICALS:  4.9 mCi Tc-37m Choletec IV COMPARISON:  None FINDINGS: Normal tracer extraction from bloodstream indicating normal hepatocellular function. Normal excretion of tracer into biliary tree. Gallbladder visualized at 17 min. Small bowel visualized at 16 min. No hepatic retention of tracer. Subjectively normal emptying of tracer from gallbladder following fatty meal stimulation. Calculated gallbladder ejection fraction is 99%, normal. Patient reported no symptoms following Ensure ingestion. Normal gallbladder ejection fraction following Ensure ingestion is greater than 33% at 1 hour. IMPRESSION: Normal exam. Electronically Signed   By: MLavonia DanaM.D.   On: 12/03/2018 14:21   UKoreaAbdomen Limited Ruq  Result Date: 12/03/2018 CLINICAL DATA:  Right upper quadrant pain EXAM: ULTRASOUND ABDOMEN LIMITED RIGHT UPPER QUADRANT COMPARISON:  October 06, 2018 FINDINGS: Gallbladder: No gallstones evident. Gallbladder wall appears borderline thickened. There is no biliary duct dilatation. No sonographic Murphy sign  noted by sonographer. Common bile duct: Diameter: 5 mm. No intrahepatic or extrahepatic biliary duct dilatation. Liver: No focal lesion identified. Within normal limits in parenchymal echogenicity. Portal vein is patent on color Doppler imaging with normal direction of blood flow towards the liver. IMPRESSION: No gallstones evident. Gallbladder wall appears borderline thickened. No pericholecystic fluid. Borderline gallbladder wall thickening may be indicative of early acalculus cholecystitis. In this regard, it may be prudent to consider nuclear medicine hepatobiliary imaging study to assess for biliary duct patency. Study otherwise unremarkable. Electronically Signed   By: WLowella GripIII M.D.   On: 12/03/2018 08:38      Specialty Problems      Pulmonary Problems   Dyspnea on exertion   Exertional dyspnea   Bronchiectasis without complication ([facility]    January 2019 high-resolution CT chest images  showing no emphysema, there is some mild bronchiectasis and mucous plugging in the bases of both lungs bilaterally      COPD exacerbation (HCC)   Pneumonia   [place]    January 2019 ratio normal, FEV1 1.36 L 74% predicted, FVC 1.79 L 76% predicted, total lung capacity 6.19 L 128% predicted, residual volume 79% predicted, DLCO 13.3 (59% predicted) flow volume loop is consistent with small airways obstruction  Allergies  Allergen Reactions  . Prednisone Shortness Of Breath, Itching, Nausea And Vomiting and Palpitations  . Solu-Medrol [Methylprednisolone] Anaphylaxis  . Amoxicillin Hives and Rash    Has patient had a PCN reaction causing immediate rash, facial/tongue/throat swelling, SOB or lightheadedness with hypotension:Yes Has patient had a PCN reaction causing severe rash involving mucus membranes or skin necrosis: No Has patient had a PCN reaction that required hospitalization: No Has patient had a PCN reaction occurring within the last 10 years: No If all of  the above answers are "NO", then may proceed with Cephalosporin use.   . Codeine Hives, Itching and Rash    Tolerated oxycodone and morphine previously  . Hydrocodone-Acetaminophen Hives, Itching and Rash    Tolerated oxycodone and morphine previously  . Penicillins Hives, Itching and Rash    ALLERGIC REACTION TO ORAL AMOXICILLIN Has patient had a PCN reaction causing immediate rash, facial/tongue/throat swelling, SOB or lightheadedness with hypotension: Yes Has patient had a PCN reaction causing severe rash involving mucus membranes or skin necrosis: No Has patient had a PCN reaction that required hospitalization: No Has patient had a PCN reaction occurring within the last 10 years: No If all of the above answers are "NO", then may proceed with Cephalosporin use.  . Tape Other (See Comments)    sore    Immunization History  Administered Date(s) Administered  . H1N1 11/13/2008  . Influenza Whole 07/21/2008, 07/28/2009, 06/28/2010, 07/05/2011, 09/22/2012  . Influenza, High Dose Seasonal PF 07/05/2017, 07/14/2018  . Influenza,inj,Quad PF,6+ Mos 07/11/2013, 07/03/2014, 06/23/2015, 06/29/2016  . Pneumococcal Conjugate-13 12/19/2013  . Pneumococcal Polysaccharide-23 02/25/2009, 04/19/2015  . Td 02/02/2010  . Tdap 04/25/2016   Up-to-date  Past Medical History:  Diagnosis Date  . Anxiety   . Bipolar disorder ([place])   . CANDIDIASIS, ESOPHAGEAL 07/28/2009   Qualifier: Diagnosis of  By: [name] MD, [name]   . Chronic kidney disease    CKD III  . COPD (chronic obstructive pulmonary disease) ([place])   . Depression   . Diabetes mellitus without complication (HCC)    no meds  . FH: colonic polyps   . Fractured elbow    right   . GERD (gastroesophageal reflux disease)   . HH (hiatus hernia)   . History of carpal tunnel syndrome   . History of chest pain   . History of transfusion of packed red blood cells   . Hyperlipidemia   . Hypertension   . Neuroleptic-induced tardive dyskinesia    . Osteoarthritis of more than one site   . Seasonal allergies     Tobacco History: Social History   Tobacco Use  Smoking Status Former Smoker  . Packs/day: 2.00  . Years: 30.00  . Pack years: 60.00  . Types: Cigarettes  . Last attempt to quit: 10/23/2001  . Years since quitting: 17.1  Smokeless Tobacco Never Used   Counseling given: Yes  Continues to not smoke.  Outpatient Encounter Medications as of 12/26/2018  Medication Sig  . acetaminophen (TYLENOL) 500 MG tablet Take 2 tablets (1,000 mg total) by mouth every 6 (six) hours as needed. (Patient taking differently: Take 1,000 mg by mouth every 6 (six) hours as needed for mild pain or headache. )  . aspirin EC 81 MG tablet Take by mouth.  Marland Kitchen atorvastatin (LIPITOR) 20 MG tablet TAKE 1 TABLET BY MOUTH ONCE DAILY (Patient taking differently: Take 20 mg by mouth daily. )  . Blood Glucose Monitoring Suppl (ACCU-CHEK NANO SMARTVIEW) W/DEVICE KIT Use to check  blood sugar 1-2 times daily (Patient taking differently: 1 strip by Other route 2 (two) times a week. )  . cetirizine (ZYRTEC) 10 MG tablet Take 10 mg by mouth at bedtime.   . diclofenac sodium (VOLTAREN) 1 % GEL Apply 3 grams to 3 large joints, up to 3 times daily. (Patient taking differently: Apply 3 g topically See admin instructions. Apply 3 grams to three large joints, up to three times a day)  . docusate sodium (COLACE) 100 MG capsule Take 100 mg by mouth at bedtime.   . DULoxetine (CYMBALTA) 60 MG capsule Take 1 capsule (60 mg total) by mouth daily.  . feeding supplement, ENSURE ENLIVE, (ENSURE ENLIVE) LIQD Take 237 mLs by mouth 3 (three) times daily between meals. (Patient taking differently: Take 237 mLs by mouth 2 (two) times daily after a meal. )  . fluticasone (FLONASE) 50 MCG/ACT nasal spray Place 1-2 sprays into both nostrils at bedtime.   Marland Kitchen glucose blood test strip Accu-chek nano, test glucose once daily, dx E11.9  . guaiFENesin (MUCINEX) 600 MG 12 hr tablet Take 600 mg by  mouth at bedtime.  Marland Kitchen ipratropium-albuterol (DUONEB) 0.5-2.5 (3) MG/3ML SOLN Take 3 mLs by nebulization 3 (three) times daily.  Marland Kitchen lamoTRIgine (LAMICTAL) 25 MG tablet Take 1 tablet (25 mg total) by mouth 3 (three) times daily.  . Lancets Misc. (ACCU-CHEK FASTCLIX LANCET) KIT USE TO CHECK BLOOD SUGAR 1-2 TIMES DAILY Dx E11.9  . linaclotide (LINZESS) 145 MCG CAPS capsule Take 145 mcg by mouth daily as needed (constipation).  Marland Kitchen losartan (COZAAR) 50 MG tablet Take 50 mg by mouth daily.   . montelukast (SINGULAIR) 10 MG tablet TAKE 1 TABLET BY MOUTH AT BEDTIME  . Multiple Vitamins-Minerals (CENTRUM SILVER 50+WOMEN) TABS Take 1 tablet by mouth daily.  . nitroGLYCERIN (NITRO-BID) 2 % ointment APPLY 1/4 INCH TOPICALLY TO AFFECTED FINGERTIPS THREE TIMES DAILY (Patient taking differently: Apply 0.25 inches topically 3 (three) times daily. To affected fingertips)  . NON FORMULARY Respivest: Three times a day for 30 minutes  . ondansetron (ZOFRAN-ODT) 4 MG disintegrating tablet DISSOLVE 1 TABLET(4 MG) ON THE TONGUE EVERY 8 HOURS AS NEEDED FOR NAUSEA OR VOMITING (Patient taking differently: Take 4 mg by mouth every 8 (eight) hours as needed for nausea or vomiting. )  . OVER THE COUNTER MEDICATION Take 1 capsule by mouth See admin instructions. Fdgard capsules: Take 1 capsule by mouth once a day  . oxyCODONE (ROXICODONE) 5 MG immediate release tablet Take 1 tablet (5 mg total) by mouth every 6 (six) hours as needed for severe pain.  . pantoprazole (PROTONIX) 40 MG tablet Take 40 mg by mouth daily.  . Probiotic Product (ALIGN) 4 MG CAPS Take 4 mg by mouth daily.  Marland Kitchen Respiratory Therapy Supplies (FLUTTER) DEVI 1 Device by Does not apply route as directed. (Patient taking differently: 1 Device as directed. )  . sodium chloride HYPERTONIC 3 % nebulizer solution Take by nebulization 2 (two) times daily. (Patient taking differently: Take 4 mLs by nebulization 2 (two) times daily. )  . triamcinolone cream (KENALOG) 0.1 %  Apply 1 application topically daily as needed (for itching of affected areas).   Marland Kitchen umeclidinium-vilanterol (ANORO ELLIPTA) 62.5-25 MCG/INH AEPB INHALE 1 PUFF ONCE DAILY  . [DISCONTINUED] ANORO ELLIPTA 62.5-25 MCG/INH AEPB INHALE 1 PUFF ONCE DAILY (Patient taking differently: Inhale 1 puff into the lungs daily. )  . amLODipine (NORVASC) 10 MG tablet Take 1 tablet (10 mg total) by mouth daily.  . hydroxychloroquine (PLAQUENIL)  200 MG tablet TAKE 1 TABLET BY MOUTH TWICE DAILY, MONDAY-FRIDAY. NONE ON SATURDAY OR SUNDAY (Patient not taking: Monday through Friday, take none on Saturday or Sunday.)  . umeclidinium-vilanterol (ANORO ELLIPTA) 62.5-25 MCG/INH AEPB Inhale 1 puff into the lungs daily for 1 day.   No facility-administered encounter medications on file as of 12/26/2018.      Review of Systems  Review of Systems  Constitutional: Positive for fatigue. Negative for chills, fever and unexpected weight change.  HENT: Negative for congestion, ear pain, sinus pressure and sinus pain.   Respiratory: Positive for cough (non productive) and shortness of breath (baseline). Negative for chest tightness and wheezing.   Cardiovascular: Positive for chest pain (occasional chest pain - states this has been going on for over 3 years). Negative for palpitations.  Gastrointestinal: Positive for vomiting (3d ago ). Negative for blood in stool, diarrhea (improved recently ) and nausea (2 episodes over last 3d).  Genitourinary: Negative for hematuria.  Musculoskeletal: Positive for arthralgias (contiued knee pain, known OA ), gait problem (walks with cane) and myalgias (L > R - knee pain - feels this is OA).  Skin: Negative for color change.  Allergic/Immunologic: Negative for environmental allergies and food allergies.  Neurological: Positive for dizziness and light-headedness. Negative for headaches (off an on).  Psychiatric/Behavioral: Negative for dysphoric mood. The patient is not nervous/anxious.   All  other systems reviewed and are negative.    Physical Exam  BP (!) 84/52 (BP Location: Left Arm, Cuff Size: Normal)   Pulse (!) 105   Temp 98.3 F (36.8 C)   Ht 5' 2.5" (1.588 m)   Wt 116 lb 6.4 oz (52.8 kg)   SpO2 95%   BMI 20.95 kg/m   Wt Readings from Last 5 Encounters:  12/26/18 116 lb 6.4 oz (52.8 kg)  12/10/18 108 lb 1.6 oz (49 kg)  12/05/18 107 lb (48.5 kg)  11/07/18 114 lb (51.7 kg)  11/05/18 117 lb 4.8 oz (53.2 kg)   >>> Pt and I discussed her blood pressure today.  Patient does admit that she is having some dizziness and lightheadedness. >>> Patient to discuss with primary care today when they call her back  Physical Exam  Constitutional: She is oriented to person, place, and time and well-developed, well-nourished, and in no distress. No distress.  HENT:  Head: Normocephalic and atraumatic.  Right Ear: Hearing, external ear and ear canal normal.  Left Ear: Hearing, external ear and ear canal normal.  Nose: Mucosal edema and rhinorrhea present. Right sinus exhibits no maxillary sinus tenderness and no frontal sinus tenderness. Left sinus exhibits no maxillary sinus tenderness and no frontal sinus tenderness.  Mouth/Throat: Uvula is midline and oropharynx is clear and moist. No oropharyngeal exudate.  +TMs with effusion bilaterally without infection  Eyes: Pupils are equal, round, and reactive to light.  Neck: Normal range of motion. Neck supple.  Cardiovascular: Normal rate, regular rhythm and normal heart sounds.  Pulmonary/Chest: Effort normal and breath sounds normal. No accessory muscle usage. No respiratory distress. She has no decreased breath sounds. She has no wheezes. She has no rhonchi. She has no rales.  Musculoskeletal:        General: Edema (trace le edema bilaterally) present.     Left knee: She exhibits decreased range of motion (Patient reports difficulty ambulating in the morning this improves with Tylenol) and swelling (?  Trace). She exhibits no  erythema. No tenderness found.     Comments: Walks with cane  Lymphadenopathy:    She has no cervical adenopathy.  Neurological: She is alert and oriented to person, place, and time.  Skin: Skin is warm and dry. She is not diaphoretic. No erythema.  Psychiatric: Mood, memory, affect and judgment normal. Her mood appears not anxious. She does not exhibit a depressed mood. She does not have a flat affect.  Nursing note and vitals reviewed.     Lab Results:  CBC    Component Value Date/Time   WBC 14.2 (H) 12/10/2018 1507   WBC 6.1 12/03/2018 1416   RBC 3.71 (L) 12/10/2018 1507   HGB 10.9 (L) 12/10/2018 1507   HCT 35.0 (L) 12/10/2018 1507   PLT 590 (H) 12/10/2018 1507   MCV 94.3 12/10/2018 1507   MCH 29.4 12/10/2018 1507   MCHC 31.1 12/10/2018 1507   RDW 16.8 (H) 12/10/2018 1507   LYMPHSABS 2.5 12/10/2018 1507   MONOABS 0.5 12/10/2018 1507   EOSABS 8.6 (H) 12/10/2018 1507   BASOSABS 0.2 (H) 12/10/2018 1507    BMET    Component Value Date/Time   NA 130 (L) 12/03/2018 1416   NA 138 12/13/2017   K 4.1 12/03/2018 1416   CL 97 12/03/2018 1416   CO2 22 12/03/2018 1416   GLUCOSE 89 12/03/2018 1416   BUN 48 (H) 12/03/2018 1416   BUN 18 12/13/2017   CREATININE 2.26 (H) 12/03/2018 1416   CALCIUM 8.7 12/03/2018 1416   CALCIUM 10.1 06/25/2008 0000   GFRNONAA 30 (L) 11/07/2018 1247   GFRAA 34 (L) 11/07/2018 1247    BNP No results found for: BNP  ProBNP    Component Value Date/Time   PROBNP 5.0 12/02/2014 0906      Assessment & Plan:     Bronchiectasis without complication (HCC) Assessment: Patient is nonadherent to bronchiectasis treatment plan BMI 20.9 Lungs clear to auscultation  Plan: Resume hypertonic saline nebs twice daily Resume therapy vest use Resume flutter valve use in place of therapy vest if you forget to use the therapy vest Continue Mucinex Continue to eat nutritious meals and hydrate appropriately Follow-up with our office in 2 to 4  weeks  COPD mixed type ([place]) Assessment: Nonadherent to an oral Ellipta mMRC 3 today Lungs clear to auscultation today  Plan: Resume Anoro Ellipta Samples of Anoro Ellipta provided to patient today [place] for you pharmaceutical assistant paperwork provided today  Raynaud's phenomenon with gangrene ([place]) Assessment: Status post amputation  Plan: Continue follow-up with rheumatology  Hypotension Assessment: Soft blood pressures on exam today Patient admits she has occasional dizziness and lightheadedness but she is not having those today in our office  Plan: Follow-up with primary care regarding the symptoms  Healthcare maintenance Assessment: Patient has multiple specialist providers that she sees regularly  Plan: Continue regular follow-up with your specialist as planned Continue close follow-up with primary care Keep close follow-up with our office and return in 2 to 4 weeks to ensure we have adherence to treatment plans for COPD as well as bronchiectasis     Lauraine Rinne, NP 12/26/2018   This appointment was 44 min long with over 50% of the time in direct face-to-face patient care, assessment, plan of care, and follow-up.

## 2018-12-27 ENCOUNTER — Telehealth: Payer: Self-pay | Admitting: Internal Medicine

## 2018-12-27 NOTE — Telephone Encounter (Signed)
Copied from CRM [id number]. Topic: General - Other >> Dec 27, 2018 12:34 PM Tamela Oddi wrote: Reason for CRM: Patient called to inform Dr. Fabian Sharp that her lung doctor told her to inform her PCP that her BP has been running low.  Please advise and call patient with any questions.  CB# [phone]

## 2018-12-28 DIAGNOSIS — J479 Bronchiectasis, uncomplicated: Secondary | ICD-10-CM | POA: Diagnosis not present

## 2018-12-30 NOTE — Progress Notes (Signed)
Reviewed, agree 

## 2019-01-02 ENCOUNTER — Other Ambulatory Visit: Payer: Self-pay

## 2019-01-02 DIAGNOSIS — D721 Eosinophilia, unspecified: Secondary | ICD-10-CM

## 2019-01-02 DIAGNOSIS — I96 Gangrene, not elsewhere classified: Secondary | ICD-10-CM

## 2019-01-02 DIAGNOSIS — I7301 Raynaud's syndrome with gangrene: Secondary | ICD-10-CM

## 2019-01-02 DIAGNOSIS — K3184 Gastroparesis: Secondary | ICD-10-CM

## 2019-01-02 DIAGNOSIS — R197 Diarrhea, unspecified: Secondary | ICD-10-CM

## 2019-01-02 DIAGNOSIS — R634 Abnormal weight loss: Secondary | ICD-10-CM

## 2019-01-02 NOTE — Telephone Encounter (Signed)
Lvm for pt to call see phone note from 12/26/2018 also. Okay for nurse triage to disclose

## 2019-01-02 NOTE — Telephone Encounter (Signed)
Lvm for pt to call. Okay for nurse triage to diclose also see phone not from 12/27/2018 about blood pressure

## 2019-01-02 NOTE — Telephone Encounter (Signed)
Patient called and advised of the note below by Dr. Fabian Sharp, patient verbalized understanding and says she stopped the amlodipine about 1 week ago. She says to let Dr. Fabian Sharp know she is still concerned about her weight and she says she's fatigued all the time, does not feel like doing anything but laying in the bed all the time. I advised I will send this to Dr. Fabian Sharp and someone will call with her recommendation. Advised patient to check BP daily or QOD while off amlodipine, since she says she doesn't check it very often. Appointment scheduled for Monday, 03/03/19 at 1415 with Dr. Fabian Sharp.

## 2019-01-02 NOTE — Telephone Encounter (Signed)
I spoke with dr Loreta Ave and except for  Low motility of her stomach all tests are good and no explanation for her weight loss.   She advised getting Dr Elvera Lennox endocrinology to  Consult about   Condition  Please contact patient about his and  Place referral to dr  Carlus Pavlov  Dx weight loss  Vomiting  Eosinophilia  Gastroparesis  Gangrenous raynauds   Please have her make a ROV in 2 mos with me  Also see other messages  About BP

## 2019-01-02 NOTE — Telephone Encounter (Signed)
If her BP is too low  ie  100 and below please have her  Stop her   bp med  amlodipine  Contact us if bp  Rises back up to 140 or above .  In the interim .  make ov 2 mos with me  To recheck.

## 2019-01-02 NOTE — Telephone Encounter (Signed)
Do the referral to  Dr Elvera Lennox  Endocrine  As advised by Dr Loreta Ave .   Dr Jeanelle Malling she will send her notes also.  I agree  Stay off of the amlodipine  For now  .

## 2019-01-03 ENCOUNTER — Ambulatory Visit: Payer: Medicare HMO | Admitting: Psychiatry

## 2019-01-04 ENCOUNTER — Other Ambulatory Visit: Payer: Self-pay | Admitting: Rheumatology

## 2019-01-04 DIAGNOSIS — R269 Unspecified abnormalities of gait and mobility: Secondary | ICD-10-CM | POA: Diagnosis not present

## 2019-01-04 DIAGNOSIS — J479 Bronchiectasis, uncomplicated: Secondary | ICD-10-CM | POA: Diagnosis not present

## 2019-01-06 ENCOUNTER — Telehealth: Payer: Self-pay

## 2019-01-06 NOTE — Telephone Encounter (Signed)
Copied from CRM [id number]. Topic: General - Other >> Jan 06, 2019  9:33 AM Percival Spanish wrote:  Pt wanted to let Dr Fabian Sharp know she has had diarrh for 3 days

## 2019-01-08 ENCOUNTER — Other Ambulatory Visit: Payer: Self-pay | Admitting: *Deleted

## 2019-01-08 NOTE — Addendum Note (Signed)
Addended byKemper Durie on: 01/08/2019 12:28 PM   Modules accepted: Orders

## 2019-01-08 NOTE — Patient Outreach (Addendum)
[facility]) Care Management  01/08/2019  Desiree [name] December 27, 1950 [id number]   Call placed to member to follow up on recent MD visit and overall management of medical conditions.  No answer, HIPAA compliant voice message left.  Will follow up within the next 3-4 business days.   Update @ 1145:    Call received back from member.  Report she is doing "pretty good."  State her fingers are healing, still has some numbness at times.  She has had increased support in the home through a friend, helping with laundry and other household chores.  She is looking to relocate to a one level home or apartment to help decrease the stress of stairs on her body.  This care manager inquired about blood pressure as per note she has been hypotensive.  State her pressures remain less than 817 systolic, but she has been asymptomatic.  She has not restarted her hypertensive meds, aware not to restart unless her BP is greater than 140.    This care manager inquired about ongoing needs for community management and discussed transition to health coach for disease management.  Feels she is stable enough at this time to be followed telephonically for disease management.    Will notify primary MD of transition. Will place referral to health coach.   [facility] CM Care Plan Problem Two     Most Recent Value  Care Plan Problem Two  Risk for complication of Raynaud's disease as evidenced by recent hospitalization requiring amputation of finger   Role Documenting the Problem Two  Care Management [place] for Problem Two  Not Active  [facility] Long Term Goal  Member will report increased support in the home within the next 45 days  [facility] Long Term Goal Start Date  10/25/18  [facility] Long Term Goal Met Date  01/08/19  [facility] CM Short Term Goal #1   Member will not show sign/symptoms of infection over the next 3 weeks  [facility] CM Short Term Goal #1 Start Date  10/25/18  [facility] CM Short Term Goal #1 Met Date   01/08/19      Valente David, RN, MSN [place] [phone]  Valente David, [place], MSN [place] [phone]

## 2019-01-09 ENCOUNTER — Other Ambulatory Visit: Payer: Self-pay | Admitting: *Deleted

## 2019-01-14 ENCOUNTER — Ambulatory Visit: Payer: Medicare HMO | Admitting: Psychiatry

## 2019-01-14 ENCOUNTER — Ambulatory Visit: Payer: Medicare HMO | Admitting: *Deleted

## 2019-01-15 ENCOUNTER — Ambulatory Visit: Payer: Medicare HMO | Admitting: *Deleted

## 2019-01-16 ENCOUNTER — Other Ambulatory Visit: Payer: Self-pay | Admitting: Psychiatry

## 2019-01-20 DIAGNOSIS — N183 Chronic kidney disease, stage 3 (moderate): Secondary | ICD-10-CM | POA: Diagnosis not present

## 2019-01-20 LAB — BASIC METABOLIC PANEL
Creatinine: 1.3 — AB (ref 0.5–1.1)
Glucose: 82
Potassium: 4.1 (ref 3.4–5.3)
Sodium: 133 — AB (ref 137–147)

## 2019-01-20 LAB — CBC AND DIFFERENTIAL
HCT: 31 — AB (ref 36–46)
Hemoglobin: 10.7 — AB (ref 12.0–16.0)
Platelets: 373 (ref 150–399)
WBC: 19.3

## 2019-01-20 LAB — VITAMIN D 25 HYDROXY (VIT D DEFICIENCY, FRACTURES): Vit D, 25-Hydroxy: 32

## 2019-01-21 ENCOUNTER — Ambulatory Visit: Payer: Self-pay

## 2019-01-21 NOTE — Telephone Encounter (Signed)
Pt called to say that she has had ongoing diarrhea.  She states that she has about 4-6 stools in a day.  She states that they are loose to watery.  She has a fullness to her mid abdomin then she has diarrhea. She had some vomiting 2 days ago.  She has been dizzy and lightheaded. She states that she is trying to drink water. NT unsure if she is eating. Pt states she is losing weight today 115lbs. She was asked but never answered. She states her BM can be dark to brown. Per protocol office was called for alternative disposition to ER.  Call transferred to [facility] at the [place] office for further evaluation of her symptoms.  Reason for Disposition . [1] Drinking very little AND [2] dehydration suspected (e.g., no urine > 12 hours, very dry mouth, very lightheaded)  Answer Assessment - Initial Assessment Questions 1. DIARRHEA SEVERITY: "How bad is the diarrhea?" "How many extra stools have you had in the past 24 hours than normal?"    - NO DIARRHEA (SCALE 0)   - MILD (SCALE 1-3): Few loose or mushy BMs; increase of 1-3 stools over normal daily number of stools; mild increase in ostomy output.   -  MODERATE (SCALE 4-7): Increase of 4-6 stools daily over normal; moderate increase in ostomy output. * SEVERE (SCALE 8-10; OR 'WORST POSSIBLE'): Increase of 7 or more stools daily over normal; moderate increase in ostomy output; incontinence.     3 today  Possible 4-6 per day 2. ONSET: "When did the diarrhea begin?"      Several months but has gotten worse in the last 3 days 3. BM CONSISTENCY: "How loose or watery is the diarrhea?"      Sometimes just loose to wate 4. VOMITING: "Are you also vomiting?" If so, ask: "How many times in the past 24 hours?"      Vomit 3 days ago 5. ABDOMINAL PAIN: "Are you having any abdominal pain?" If yes: "What does it feel like?" (e.g., crampy, dull, intermittent, constant)      Under rib tight both sides 6. ABDOMINAL PAIN SEVERITY: If present, ask: "How bad is the pain?"   (e.g., Scale 1-10; mild, moderate, or severe)   - MILD (1-3): doesn't interfere with normal activities, abdomen soft and not tender to touch    - MODERATE (4-7): interferes with normal activities or awakens from sleep, tender to touch    - SEVERE (8-10): excruciating pain, doubled over, unable to do any normal activities       Not really pain full feeling then diarrhea 7. ORAL INTAKE: If vomiting, "Have you been able to drink liquids?" "How much fluids have you had in the past 24 hours?"     Drinking water, eating but then goes to BR 8. HYDRATION: "Any signs of dehydration?" (e.g., dry mouth [not just dry lips], too weak to stand, dizziness, new weight loss) "When did you last urinate?"     Dizziness, weight loss now 115 9. EXPOSURE: "Have you traveled to a foreign country recently?" "Have you been exposed to anyone with diarrhea?" "Could you have eaten any food that was spoiled?"     no 10. ANTIBIOTIC USE: "Are you taking antibiotics now or have you taken antibiotics in the past 2 months?"       no 11. OTHER SYMPTOMS: "Do you have any other symptoms?" (e.g., fever, blood in stool)       no 12. PREGNANCY: "Is there any chance you are  pregnant?" "When was your last menstrual period?"       N/A  Protocols used: DIARRHEA-A-AH

## 2019-01-22 ENCOUNTER — Other Ambulatory Visit: Payer: Self-pay | Admitting: Gastroenterology

## 2019-01-22 DIAGNOSIS — R634 Abnormal weight loss: Secondary | ICD-10-CM

## 2019-01-22 DIAGNOSIS — K559 Vascular disorder of intestine, unspecified: Secondary | ICD-10-CM

## 2019-01-24 ENCOUNTER — Ambulatory Visit: Payer: Medicare HMO | Admitting: Pulmonary Disease

## 2019-01-28 DIAGNOSIS — N183 Chronic kidney disease, stage 3 (moderate): Secondary | ICD-10-CM | POA: Diagnosis not present

## 2019-01-28 DIAGNOSIS — N2581 Secondary hyperparathyroidism of renal origin: Secondary | ICD-10-CM | POA: Diagnosis not present

## 2019-01-28 DIAGNOSIS — I129 Hypertensive chronic kidney disease with stage 1 through stage 4 chronic kidney disease, or unspecified chronic kidney disease: Secondary | ICD-10-CM | POA: Diagnosis not present

## 2019-01-28 DIAGNOSIS — D631 Anemia in chronic kidney disease: Secondary | ICD-10-CM | POA: Diagnosis not present

## 2019-01-28 DIAGNOSIS — J479 Bronchiectasis, uncomplicated: Secondary | ICD-10-CM | POA: Diagnosis not present

## 2019-01-30 DIAGNOSIS — D6859 Other primary thrombophilia: Secondary | ICD-10-CM | POA: Diagnosis not present

## 2019-01-31 ENCOUNTER — Ambulatory Visit
Admission: RE | Admit: 2019-01-31 | Discharge: 2019-01-31 | Disposition: A | Discharge status: Home / Self Care | Payer: Medicare HMO | Source: Ambulatory Visit | Attending: Gastroenterology | Admitting: Gastroenterology

## 2019-01-31 ENCOUNTER — Other Ambulatory Visit: Payer: Self-pay

## 2019-01-31 ENCOUNTER — Ambulatory Visit: Payer: Self-pay | Admitting: *Deleted

## 2019-01-31 ENCOUNTER — Other Ambulatory Visit: Payer: Self-pay | Admitting: Pulmonary Disease

## 2019-01-31 DIAGNOSIS — R109 Unspecified abdominal pain: Secondary | ICD-10-CM | POA: Diagnosis not present

## 2019-01-31 DIAGNOSIS — K559 Vascular disorder of intestine, unspecified: Secondary | ICD-10-CM

## 2019-01-31 DIAGNOSIS — R634 Abnormal weight loss: Secondary | ICD-10-CM

## 2019-01-31 MED ORDER — IOPAMIDOL (ISOVUE-370) INJECTION 76%
75.0000 mL | Freq: Once | INTRAVENOUS | Status: AC | PRN
  Administered 2019-01-31: 60 mL via INTRAVENOUS

## 2019-01-31 NOTE — Telephone Encounter (Signed)
Patient is calling because the front of her leg are sore. She is having difficulty walking. At times she does fall. But she has not fallen lately. She is afraid to climb to climb the steps. Because she is afraid of falling. She is also having problems sleeping. And has a loss of appetite. [id number]  Patient is complaining of leg pain- she states she does have control of her legs but they have been getting weaker and she is using a cane. Patient is having pain in upper legs- started in thigh and may even be in the  bone- both sides. Patient reports her symptoms have been getting worse over the past week. Patient has been going to her appointments at the hematologist and for her scans and she is having trouble with her ambulation. Patient is using Tylenol fpr her pain. She states her pain level is at a moderate to severe.  Reason for Disposition . [1] MODERATE pain (e.g., interferes with normal activities, limping) AND [2] present > 3 days  Answer Assessment - Initial Assessment Questions 1. ONSET: "When did the pain start?"      Worse over the past week 2. LOCATION: "Where is the pain located?"      Upper legs 3. PAIN: "How bad is the pain?"    (Scale 1-10; or mild, moderate, severe)   -  MILD (1-3): doesn't interfere with normal activities    -  MODERATE (4-7): interferes with normal activities (e.g., work or school) or awakens from sleep, limping    -  SEVERE (8-10): excruciating pain, unable to do any normal activities, unable to walk     7-8- sitting- sore 4. WORK OR EXERCISE: "Has there been any recent work or exercise that involved this part of the body?"      no 5. CAUSE: "What do you think is causing the leg pain?"     Pain in front of legs is new- pain in side of legs came and went away 6. OTHER SYMPTOMS: "Do you have any other symptoms?" (e.g., chest pain, back pain, breathing difficulty, swelling, rash, fever, numbness, weakness)     Swelling in ankles- is improved, chest  pain- patient has had- COPD,lung problems 7. PREGNANCY: "Is there any chance you are pregnant?" "When was your last menstrual period?"     n/a  Protocols used: LEG PAIN-A-AH

## 2019-02-03 ENCOUNTER — Ambulatory Visit: Payer: Self-pay | Admitting: Physician Assistant

## 2019-02-03 NOTE — Telephone Encounter (Signed)
Lvm for pt to call back to schedule an appt  

## 2019-02-04 ENCOUNTER — Other Ambulatory Visit: Payer: Self-pay

## 2019-02-04 ENCOUNTER — Ambulatory Visit (INDEPENDENT_AMBULATORY_CARE_PROVIDER_SITE_OTHER): Payer: Medicare HMO | Admitting: Internal Medicine

## 2019-02-04 ENCOUNTER — Encounter: Payer: Self-pay | Admitting: Internal Medicine

## 2019-02-04 DIAGNOSIS — Z8679 Personal history of other diseases of the circulatory system: Secondary | ICD-10-CM

## 2019-02-04 DIAGNOSIS — M79605 Pain in left leg: Secondary | ICD-10-CM | POA: Diagnosis not present

## 2019-02-04 DIAGNOSIS — Z789 Other specified health status: Secondary | ICD-10-CM

## 2019-02-04 DIAGNOSIS — M79604 Pain in right leg: Secondary | ICD-10-CM | POA: Diagnosis not present

## 2019-02-04 DIAGNOSIS — R269 Unspecified abnormalities of gait and mobility: Secondary | ICD-10-CM | POA: Diagnosis not present

## 2019-02-04 DIAGNOSIS — M170 Bilateral primary osteoarthritis of knee: Secondary | ICD-10-CM

## 2019-02-04 DIAGNOSIS — J479 Bronchiectasis, uncomplicated: Secondary | ICD-10-CM | POA: Diagnosis not present

## 2019-02-04 DIAGNOSIS — Z79899 Other long term (current) drug therapy: Secondary | ICD-10-CM

## 2019-02-04 DIAGNOSIS — D7219 Other eosinophilia: Secondary | ICD-10-CM

## 2019-02-04 DIAGNOSIS — D721 Eosinophilia: Secondary | ICD-10-CM | POA: Diagnosis not present

## 2019-02-04 NOTE — Progress Notes (Signed)
Virtual Visit via Video Note  I connected with@ on 02/04/19 at  2:15 PM EDT by a video enabled telemedicine application and verified that I am speaking with the correct person using two identifiers. But had to use phone since she couldn't get the link  Supplied  Location patient: home Location provider:work  office Persons participating in the virtual visit: patient, provider  [place]th national recommendations  regarding COVID 19 pandemic   video visit is advised over in office visit for this patient.  Discussed the limitations of evaluation and management by telemedicine and  availability of in person appointments. The patient expressed understanding and agreed to proceed.   HPI: Desiree [name] After 10 minutes patient  nmnot getting text link for doxy   So had to do a phone visit   Has had  Anterior "bone pain" bilateral dital femur? For about 3-4 weeks and makjes her walk off balance .  No falling with it but at risk  Denies knee joint swelling  And ? If hip issues.  Weight stable at 115.7 but appetite is bad and asks about med for appetite? Has had a ct scan doesn't recently?   pulmo on inhalers  .  Has seen hem  And not sure of dx  Told she had an elevated WBC.   No new vasculitis sx   No temp but at [facility] was 99.7 one time   No specific hip pain noted    ROS: See pertinent positives and negatives per HPI.  Past Medical History:  Diagnosis Date  . Anxiety   . Bipolar disorder ([place])   . CANDIDIASIS, ESOPHAGEAL 07/28/2009   Qualifier: Diagnosis of  By: [name] MD, [name]   . Chronic kidney disease    CKD III  . COPD (chronic obstructive pulmonary disease) ([place])   . Depression   . Diabetes mellitus without complication (HCC)    no meds  . FH: colonic polyps   . Fractured elbow    right   . GERD (gastroesophageal reflux disease)   . HH (hiatus hernia)   . History of carpal tunnel syndrome   . History of chest pain   . History of transfusion of packed red blood cells    . Hyperlipidemia   . Hypertension   . Neuroleptic-induced tardive dyskinesia   . Osteoarthritis of more than one site   . Seasonal allergies     Past Surgical History:  Procedure Laterality Date  . AMPUTATION Left 10/14/2018   Procedure: AMPUTATION LEFT LONG FINGER TIP;  Surgeon: Dayna Barker, MD;  Location: [place];  Service: Plastics;  Laterality: Left;  . ANTERIOR CERVICAL DECOMP/DISCECTOMY FUSION  09/27/2016   C5-6 anterior cervical discectomy and fusion, allograft and plate/notes 09/27/2016  . ANTERIOR CERVICAL DECOMP/DISCECTOMY FUSION N/A 09/27/2016   Procedure: C5-6 Anterior Cervical Discectomy and Fusion, Allograft and Plate;  Surgeon: [name], MD;  Location: [place];  Service: Orthopedics;  Laterality: N/A;  . Back Fusion  2002  . BIOPSY  07/19/2018   Procedure: BIOPSY;  Surgeon: [name], MD;  Location: [place];  Service: Endoscopy;;  . CARPAL TUNNEL RELEASE  yates   left  . COLONOSCOPY N/A 01/05/2014   Procedure: COLONOSCOPY;  Surgeon: [name], MD;  Location: [facility];  Service: Endoscopy;  Laterality: N/A;  . ELBOW SURGERY     age 67  . ESOPHAGOGASTRODUODENOSCOPY (EGD) WITH PROPOFOL N/A 07/19/2018   Procedure: ESOPHAGOGASTRODUODENOSCOPY (EGD) WITH PROPOFOL;  Surgeon: [name], MD;  Location: [place];  Service: Endoscopy;  Laterality: N/A;  . EXTERNAL EAR SURGERY Left   . EYE SURGERY     "removed white dots under eyelid"  . FINGER SURGERY Left   . Juvara osteomy    . KNEE SURGERY    . NOSE SURGERY    . Rt. toe bunion    . skin, shave biopsy  05/03/2016   Left occipital scalp, top of scalp  . UPPER EXTREMITY ANGIOGRAPHY Bilateral 10/11/2018   Procedure: UPPER EXTREMITY ANGIOGRAPHY;  Surgeon: [name], MD;  Location: [place] [facility];  Service: Cardiovascular;  Laterality: Bilateral;    Family History  Problem Relation Age of Onset  . Heart attack Father   . Heart disease Father   . Throat cancer Brother   . Diabetes Mother    . Hypertension Mother   . Heart attack Mother   . Diabetes type II Brother   . Heart disease Brother   . Lung cancer Brother   . Breast cancer Cousin   . Lung cancer Daughter   . Lung cancer Paternal Uncle     SOCIAL HX:    Current Outpatient Medications:  .  acetaminophen (TYLENOL) 500 MG tablet, Take 2 tablets (1,000 mg total) by mouth every 6 (six) hours as needed. (Patient taking differently: Take 1,000 mg by mouth every 6 (six) hours as needed for mild pain or headache. ), Disp: 30 tablet, Rfl: 0 .  amLODipine (NORVASC) 10 MG tablet, Take 1 tablet (10 mg total) by mouth daily., Disp: 30 tablet, Rfl: 0 .  aspirin EC 81 MG tablet, Take by mouth., Disp: , Rfl:  .  atorvastatin (LIPITOR) 20 MG tablet, TAKE 1 TABLET BY MOUTH ONCE DAILY (Patient taking differently: Take 20 mg by mouth daily. ), Disp: 90 tablet, Rfl: 0 .  Blood Glucose Monitoring Suppl (ACCU-CHEK NANO SMARTVIEW) W/DEVICE KIT, Use to check blood sugar 1-2 times daily (Patient taking differently: 1 strip by Other route 2 (two) times a week. ), Disp: 1 kit, Rfl: 0 .  cetirizine (ZYRTEC) 10 MG tablet, Take 10 mg by mouth at bedtime. , Disp: , Rfl:  .  diclofenac sodium (VOLTAREN) 1 % GEL, Apply 3 grams to 3 large joints, up to 3 times daily. (Patient taking differently: Apply 3 g topically See admin instructions. Apply 3 grams to three large joints, up to three times a day), Disp: 3 Tube, Rfl: 3 .  docusate sodium (COLACE) 100 MG capsule, Take 100 mg by mouth at bedtime. , Disp: , Rfl:  .  DULoxetine (CYMBALTA) 60 MG capsule, Take 1 capsule (60 mg total) by mouth daily., Disp: 30 capsule, Rfl: 3 .  feeding supplement, ENSURE ENLIVE, (ENSURE ENLIVE) LIQD, Take 237 mLs by mouth 3 (three) times daily between meals. (Patient taking differently: Take 237 mLs by mouth 2 (two) times daily after a meal. ), Disp: 14 Bottle, Rfl: 0 .  fluticasone (FLONASE) 50 MCG/ACT nasal spray, Place 1-2 sprays into both nostrils at bedtime. , Disp: ,  Rfl:  .  glucose blood test strip, Accu-chek nano, test glucose once daily, dx E11.9, Disp: 100 each, Rfl: 12 .  guaiFENesin (MUCINEX) 600 MG 12 hr tablet, Take 600 mg by mouth at bedtime., Disp: , Rfl:  .  hydroxychloroquine (PLAQUENIL) 200 MG tablet, TAKE 1 TABLET BY MOUTH TWICE DAILY, MONDAY-FRIDAY. NONE ON SATURDAY OR SUNDAY (Patient not taking: Monday through Friday, take none on Saturday or Sunday.), Disp: 40 tablet, Rfl: 0 .  ipratropium-albuterol (DUONEB) 0.5-2.5 (3) MG/3ML  SOLN, Take 3 mLs by nebulization 3 (three) times daily., Disp: 360 mL, Rfl: 11 .  lamoTRIgine (LAMICTAL) 25 MG tablet, TAKE 1 TABLET(25 MG) BY MOUTH THREE TIMES DAILY, Disp: 270 tablet, Rfl: 0 .  Lancets Misc. (ACCU-CHEK FASTCLIX LANCET) KIT, USE TO CHECK BLOOD SUGAR 1-2 TIMES DAILY Dx E11.9, Disp: 1 kit, Rfl: 1 .  linaclotide (LINZESS) 145 MCG CAPS capsule, Take 145 mcg by mouth daily as needed (constipation)., Disp: , Rfl:  .  losartan (COZAAR) 50 MG tablet, Take 50 mg by mouth daily. , Disp: , Rfl:  .  montelukast (SINGULAIR) 10 MG tablet, TAKE 1 TABLET BY MOUTH AT BEDTIME, Disp: 90 tablet, Rfl: 0 .  Multiple Vitamins-Minerals (CENTRUM SILVER 50+WOMEN) TABS, Take 1 tablet by mouth daily., Disp: , Rfl:  .  nitroGLYCERIN (NITRO-BID) 2 % ointment, APPLY 1/4 INCH TOPICALLY TO AFFECTED FINGERTIPS THREE TIMES DAILY (Patient taking differently: Apply 0.25 inches topically 3 (three) times daily. To affected fingertips), Disp: 30 g, Rfl: 0 .  NON FORMULARY, Respivest: Three times a day for 30 minutes, Disp: , Rfl:  .  ondansetron (ZOFRAN-ODT) 4 MG disintegrating tablet, DISSOLVE 1 TABLET(4 MG) ON THE TONGUE EVERY 8 HOURS AS NEEDED FOR NAUSEA OR VOMITING (Patient taking differently: Take 4 mg by mouth every 8 (eight) hours as needed for nausea or vomiting. ), Disp: 20 tablet, Rfl: 0 .  OVER THE COUNTER MEDICATION, Take 1 capsule by mouth See admin instructions. Fdgard capsules: Take 1 capsule by mouth once a day, Disp: , Rfl:  .   oxyCODONE (ROXICODONE) 5 MG immediate release tablet, Take 1 tablet (5 mg total) by mouth every 6 (six) hours as needed for severe pain., Disp: 2 tablet, Rfl: 0 .  pantoprazole (PROTONIX) 40 MG tablet, Take 40 mg by mouth daily., Disp: , Rfl:  .  Probiotic Product (ALIGN) 4 MG CAPS, Take 4 mg by mouth daily., Disp: , Rfl:  .  Respiratory Therapy Supplies (FLUTTER) DEVI, 1 Device by Does not apply route as directed. (Patient taking differently: 1 Device as directed. ), Disp: 1 each, Rfl: 0 .  sodium chloride HYPERTONIC 3 % nebulizer solution, Take by nebulization 2 (two) times daily. (Patient taking differently: Take 4 mLs by nebulization 2 (two) times daily. ), Disp: 750 mL, Rfl: 5 .  triamcinolone cream (KENALOG) 0.1 %, Apply 1 application topically daily as needed (for itching of affected areas). , Disp: , Rfl: 0 .  umeclidinium-vilanterol (ANORO ELLIPTA) 62.5-25 MCG/INH AEPB, INHALE 1 PUFF ONCE DAILY, Disp: 60 each, Rfl: 5  EXAM:  VITALS per patient if applicable:  GENERAL: alert, oriented, appears well and in no acute distress   Voice steady and no sob   Unable to do video visit  Cause of   techincal difficulties  So did phone visit  PSYCH/NEURO: pleasant and cooperative, no obvious depression or anxiety, speech and thought processing grossly intact  ASSESSMENT AND PLAN:  Discussed the following assessment and plan:  Pain in both lower extremities  Medically complex patient  High risk medication use  History of Raynaud's syndrome  Eosinophilic leukocytosis  Primary osteoarthritis of both knees Leg pain bilateral and symmetric  Denies acute knee joint swelling  butr has hx of knee arthritsi   When ? Says onto at the knee but  Up above   ? If myalga other referred . Trial off atorvastatin  For 3-4 weeks then call or  VV has appt ont he books   As if better may change medication  Or rechallenge   May have dr D address if  persistent or progressive at this time we are not doing  inperson visits  And she was able to do a video visit wih dr Posey Pronto   May try again later .,   Expectant management and discussion of plan and treatment with patient with opportunity to ask questions and all were answered. The patient agreed with the plan and demonstrated an understanding of the instructions.   The patient was advised to call back or seek an in-person evaluation if worsening having concerns    or if the condition fails to improve as anticipated.  I provided 22 minutes of non-face-to-face time during this encounter. Telephone encounter .   Shanon Ace, MD

## 2019-02-05 ENCOUNTER — Other Ambulatory Visit: Payer: Self-pay

## 2019-02-05 NOTE — Patient Outreach (Addendum)
[facility]) Care Management  02/05/2019  Desiree [name] 01-16-1951 [id number]  Return call received from the patient. The patient reports she has experienced an increase in her utility bill which is causing her to run out of money. The patient is experiencing difficulty in affording food as well as meal preparation due to medical condition at this time.  SW contacted Susa Loffler with [employer] [place] who reports that due to COVID-19 Pandemic they would be unable to deliver food to the patient until April 28th. SW collaborated with co-worker Contractor as well as Astronomer Livia Snellen. SW enrolled the patient in the [place] Covid Outreach Program. SW received confirmation the patient would receive meals on Friday April 17th. SW contacted the patient to provide this information.  SW also explained she would receive a call from a [facility] Care Management CSW in the coming days. The patient understands to contact this SW directly if needed prior to this planned outreach. SW placed a referral for the patient to be added to the waiting list for meals on wheels.  [name], BSW, CDP [facility] [phone]

## 2019-02-05 NOTE — Patient Outreach (Signed)
[facility]) Care Management  02/05/2019  Desiree [name] 29-Oct-1950 [id number]  BSW received voice message from the patient requesting assistance with obtaining food. BSW placed return call to the patient which was unsuccessful. BSW left a HIPAA compliant voice message.  BSW collaborated with patients previous RNCM [employer] regarding patient voice message. Mrs. Desiree [name] reports she also received a message from the patient requesting assistance with food and a life alert. Mrs. Desiree [name] noted she transitioned the patient to a health coach within the last month.   BSW placed a high priority referral to a [facility] Care Management community social worker as this Clinical research associate is embedded in a physicians office. BSW will update the patients assigned care team members if a return call is received.  [name], BSW, CDP [facility] [phone]

## 2019-02-07 ENCOUNTER — Telehealth: Payer: Self-pay | Admitting: *Deleted

## 2019-02-07 ENCOUNTER — Other Ambulatory Visit: Payer: Self-pay

## 2019-02-07 NOTE — Patient Outreach (Signed)
[facility]) Care Management  02/07/2019  Desiree [name] 10/14/1951 [id number]   Unsuccessful outreach to patient regarding social work referral for food and life alert resources.  BSW, [name], has already linked patient to [facility] which will allow her to receive seven prepared meals per week until further notice.  A referral has also been placed for Mobile Meals.   BSW attempted to reach patient today to discuss life alert resources but had to leave a voicemail message.  Will attempt to reach her again within four business days.  [name], BSW Social Worker [phone]

## 2019-02-10 ENCOUNTER — Other Ambulatory Visit: Payer: Self-pay | Admitting: *Deleted

## 2019-02-10 ENCOUNTER — Other Ambulatory Visit: Payer: Self-pay

## 2019-02-10 ENCOUNTER — Other Ambulatory Visit: Payer: Self-pay | Admitting: Gastroenterology

## 2019-02-10 NOTE — Patient Outreach (Signed)
[facility]) Care Management  02/10/2019  HIYA BOERS 1950/11/23 [id number]   RN Health Coach attempted #1 follow up outreach call to patient.  Patient was unavailable. HIPPA compliance voicemail message left with return callback number.  Plan: RN will call patient again within 30 days.  Gean Maidens BSN RN [facility] Care Management [phone]

## 2019-02-10 NOTE — Patient Outreach (Signed)
[facility]) Care Management  02/10/2019   DAJHA URQUILLA [birth year] [phone]  RN Health Coach telephone call to patient.  [employer] compliance verified. Per patient she is doing pretty good. Patient admitted that she is not using her inhalers or nebulizer as per Dr ordered. She stated she basically uses them when she becomes symptomatic. RN discussed with patient about wearing a mask for precaution with the virus. Patient stated she doesn't  have one and she has been going out. Patient stated that she has not been able to gain weight. Per patient she is on medication for the nausea and sometimes she has diarrhea. RN asked patient how many times a day does she eat and patient stated sometimes one a day. Patient had used Ensure in the past but stated that it was high and she hadn't been able to afford. She did get a little increase in her food stamps. Patient has agreed to follow up outreach calls.   Current Medications:  Current Outpatient Medications  Medication Sig Dispense Refill  . acetaminophen (TYLENOL) 500 MG tablet Take 2 tablets (1,000 mg total) by mouth every 6 (six) hours as needed. (Patient taking differently: Take 1,000 mg by mouth every 6 (six) hours as needed for mild pain or headache. ) 30 tablet 0  . amLODipine (NORVASC) 10 MG tablet Take 1 tablet (10 mg total) by mouth daily. 30 tablet 0  . aspirin EC 81 MG tablet Take by mouth.    Marland Kitchen atorvastatin (LIPITOR) 20 MG tablet TAKE 1 TABLET BY MOUTH ONCE DAILY (Patient taking differently: Take 20 mg by mouth daily. ) 90 tablet 0  . Blood Glucose Monitoring Suppl (ACCU-CHEK NANO SMARTVIEW) W/DEVICE KIT Use to check blood sugar 1-2 times daily (Patient taking differently: 1 strip by Other route 2 (two) times a week. ) 1 kit 0  . cetirizine (ZYRTEC) 10 MG tablet Take 10 mg by mouth at bedtime.     . diclofenac sodium (VOLTAREN) 1 % GEL Apply 3 grams to 3 large joints, up to 3 times daily. (Patient taking differently: Apply 3 g  topically See admin instructions. Apply 3 grams to three large joints, up to three times a day) 3 Tube 3  . docusate sodium (COLACE) 100 MG capsule Take 100 mg by mouth at bedtime.     . DULoxetine (CYMBALTA) 60 MG capsule Take 1 capsule (60 mg total) by mouth daily. 30 capsule 3  . feeding supplement, ENSURE ENLIVE, (ENSURE ENLIVE) LIQD Take 237 mLs by mouth 3 (three) times daily between meals. (Patient taking differently: Take 237 mLs by mouth 2 (two) times daily after a meal. ) 14 Bottle 0  . fluticasone (FLONASE) 50 MCG/ACT nasal spray Place 1-2 sprays into both nostrils at bedtime.     Marland Kitchen glucose blood test strip Accu-chek nano, test glucose once daily, dx E11.9 100 each 12  . guaiFENesin (MUCINEX) 600 MG 12 hr tablet Take 600 mg by mouth at bedtime.    . hydroxychloroquine (PLAQUENIL) 200 MG tablet TAKE 1 TABLET BY MOUTH TWICE DAILY, MONDAY-FRIDAY. NONE ON SATURDAY OR SUNDAY (Patient not taking: Monday through Friday, take none on Saturday or Sunday.) 40 tablet 0  . ipratropium-albuterol (DUONEB) 0.5-2.5 (3) MG/3ML SOLN Take 3 mLs by nebulization 3 (three) times daily. 360 mL 11  . lamoTRIgine (LAMICTAL) 25 MG tablet TAKE 1 TABLET(25 MG) BY MOUTH THREE TIMES DAILY 270 tablet 0  . Lancets Misc. (ACCU-CHEK FASTCLIX LANCET) KIT USE TO CHECK BLOOD SUGAR 1-2 TIMES  DAILY Dx E11.9 1 kit 1  . linaclotide (LINZESS) 145 MCG CAPS capsule Take 145 mcg by mouth daily as needed (constipation).    Marland Kitchen losartan (COZAAR) 50 MG tablet Take 50 mg by mouth daily.     . montelukast (SINGULAIR) 10 MG tablet TAKE 1 TABLET BY MOUTH AT BEDTIME 90 tablet 0  . Multiple Vitamins-Minerals (CENTRUM SILVER 50+WOMEN) TABS Take 1 tablet by mouth daily.    . nitroGLYCERIN (NITRO-BID) 2 % ointment APPLY 1/4 INCH TOPICALLY TO AFFECTED FINGERTIPS THREE TIMES DAILY (Patient taking differently: Apply 0.25 inches topically 3 (three) times daily. To affected fingertips) 30 g 0  . NON FORMULARY Respivest: Three times a day for 30 minutes     . ondansetron (ZOFRAN-ODT) 4 MG disintegrating tablet DISSOLVE 1 TABLET(4 MG) ON THE TONGUE EVERY 8 HOURS AS NEEDED FOR NAUSEA OR VOMITING (Patient taking differently: Take 4 mg by mouth every 8 (eight) hours as needed for nausea or vomiting. ) 20 tablet 0  . OVER THE COUNTER MEDICATION Take 1 capsule by mouth See admin instructions. Fdgard capsules: Take 1 capsule by mouth once a day    . oxyCODONE (ROXICODONE) 5 MG immediate release tablet Take 1 tablet (5 mg total) by mouth every 6 (six) hours as needed for severe pain. 2 tablet 0  . pantoprazole (PROTONIX) 40 MG tablet Take 40 mg by mouth daily.    . Probiotic Product (ALIGN) 4 MG CAPS Take 4 mg by mouth daily.    Marland Kitchen Respiratory Therapy Supplies (FLUTTER) DEVI 1 Device by Does not apply route as directed. (Patient taking differently: 1 Device as directed. ) 1 each 0  . sodium chloride HYPERTONIC 3 % nebulizer solution Take by nebulization 2 (two) times daily. (Patient taking differently: Take 4 mLs by nebulization 2 (two) times daily. ) 750 mL 5  . triamcinolone cream (KENALOG) 0.1 % Apply 1 application topically daily as needed (for itching of affected areas).   0  . umeclidinium-vilanterol (ANORO ELLIPTA) 62.5-25 MCG/INH AEPB INHALE 1 PUFF ONCE DAILY 60 each 5   No current facility-administered medications for this visit.     Functional Status:  In your present state of health, do you have any difficulty performing the following activities: 02/10/2019 10/09/2018  Hearing? N -  Vision? N -  Difficulty concentrating or making decisions? N -  Walking or climbing stairs? N -  Dressing or bathing? N -  Doing errands, shopping? N N  Preparing Food and eating ? N -  Using the Toilet? N -  In the past six months, have you accidently leaked urine? N -  Do you have problems with loss of bowel control? N -  Managing your Medications? N -  Managing your Finances? N -  Housekeeping or managing your Housekeeping? N -  Some recent data might be  hidden    Fall/Depression Screening: Fall Risk  02/10/2019 08/22/2018 07/05/2017  Falls in the past year? 1 Yes No  Number falls in past yr: 0 1 -  Injury with Fall? 0 No -  Risk for fall due to : History of fall(s);Impaired balance/gait - -  Follow up - Falls prevention discussed -   PHQ 2/9 Scores 02/10/2019 08/22/2018 07/22/2018 07/05/2017 01/12/2016 01/01/2015 11/06/2012  PHQ - 2 Score 0 0 0 0 - 0 0  Exception Documentation - - - - Other- indicate reason in comment box - -  Not completed - - - - sees  psychiatry for meds and depression - -  THN CM Care Plan Problem One     Most Recent Value  Care Plan Problem One  Knowledge Deficit in Self Management of COPD  Role Documenting the Problem One  Health Coach  Care Plan for Problem One  Active  THN Long Term Goal   Patient will not have any admissions for COPD exacerbation within the next 90 days  THN Long Term Goal Start Date  02/10/19  Interventions for Problem One Long Term Goal  RN discussed COPD exacerbation. RN sent educational information on COPD. RN discussed COPD action plan and zones. RN will follow up with further discussion and teachback  THN CM Short Term Goal #1   Patient will have a better understanding of the cornaviruse and safety within the next 30 days  THN CM Short Term Goal #1 Start Date  02/10/19  Interventions for Short Term Goal #1  RN discussed cornavirus precautions.  RN sent educational material on cornavirus. RN sent patient a mask. RN will follow up with further discussion  THN CM Short Term Goal #2   Patient will verbalized eating healthier bland food within the next 30 days  THN CM Short Term Goal #2 Start Date  02/10/19  Interventions for Short Term Goal #2  RN disucssed eating foods that are not spicy and more bland to decrease nausea. RN will follow up with further discussion     Assessment: Patient does not follow the coronavirus precautions Patient is not using inhalers or nebulizer as Dr  ordered Patient has a poor appetite Patient will benefit from Health Coach telephonic outreach for education and support for COPD self management.   Plan:  RN discussed taking medications as per Dr has ordered RN discussed bland foods for patient to try eating RN discussed Coronavirus safety measures RN discussed COPD exacerbation RN sent a safety mask RN sentClinical Key education COPD exacerbation RN sent Clinical Key education  COPD and physical activity RN sent Clinical Key education form COPD action plan RN sent Clinical Key education  Eating Plan for COPD RN sent Clinical Key education  Covid-19 How to protect yourself and others RN sent barriers letter and assessment to PCP  [place] [facility] [phone]

## 2019-02-10 NOTE — Patient Outreach (Signed)
[facility]) Care Management  02/10/2019  VON PEAN 10/22/1951 [id number]   Successful outreach to patient regarding social work referral for life alert resources.  Patient reports that she contacted her insurance company and they do not cover cost of life alert system.  She is unable to afford the other systems she has located.  BSW agreed to mail her a list of systems that start in the $20-$25 price range.   In reviewing the last outreach entered by [facility] Gean Maidens, BSW noted that patient sometimes utilizes Ensure but reported having difficulty affording it.  BSW agreed to mail Ensure coupons to patient if there are some still available at [facility] office.  BSW will follow up within the next two weeks to ensure receipt of mailing.   [name], BSW Social Worker [phone]

## 2019-02-11 ENCOUNTER — Other Ambulatory Visit: Payer: Self-pay

## 2019-02-11 ENCOUNTER — Encounter (HOSPITAL_COMMUNITY): Payer: Self-pay | Admitting: *Deleted

## 2019-02-11 ENCOUNTER — Ambulatory Visit: Payer: Self-pay

## 2019-02-11 NOTE — Progress Notes (Signed)
SPOKE W/  Desiree [name] phone with patient     SCREENING SYMPTOMS OF COVID 19:   COUGH--No  RUNNY NOSE---No   SORE THROAT---No  NASAL CONGESTION----No  SNEEZING----No  SHORTNESS OF BREATH---No  DIFFICULTY BREATHING---No  TEMP >100.4-----No  UNEXPLAINED BODY ACHES-----No   HAVE YOU OR ANY FAMILY MEMBER TRAVELLED PAST 14 DAYS OUT OF THE   COUNTY---No STATE----No COUNTRY----No  HAVE YOU OR ANY FAMILY MEMBER BEEN EXPOSED TO ANYONE WITH COVID 19? No   Spoke to patient about Visitor Restrictions at this time and patient verbalized understanding.

## 2019-02-12 ENCOUNTER — Telehealth: Payer: Self-pay | Admitting: Internal Medicine

## 2019-02-12 NOTE — Telephone Encounter (Signed)
Advise she get advice on sleep aids from her psychiatry or behavioral health team .

## 2019-02-12 NOTE — Telephone Encounter (Signed)
Copied from CRM [id number]. Topic: Quick Communication - See Telephone Encounter >> Feb 12, 2019  2:02 PM Louie Bun, [name] wrote: CRM for notification. See Telephone encounter for: 02/12/19. Patient called and would like to let Dr. Fabian Sharp know that she has not been able to sleep at night, she may get 1 to 2 hours of sleep all day. She would like to know if she can prescribe her something to help her fall a sleep or what should she do. Please call patient back, thanks.

## 2019-02-12 NOTE — Telephone Encounter (Signed)
lvm for pt to call back.  

## 2019-02-12 NOTE — Anesthesia Preprocedure Evaluation (Addendum)
Anesthesia Evaluation  Patient identified by MRN, date of birth, ID band Patient awake    Reviewed: Allergy & Precautions, NPO status , Patient's Chart, lab work & pertinent test results  Airway Mallampati: II  TM Distance: >3 FB Neck ROM: Full    Dental  (+) Partial Lower, Edentulous Upper   Pulmonary COPD,  COPD inhaler, former smoker,    Pulmonary exam normal breath sounds clear to auscultation       Cardiovascular hypertension, Pt. on medications Normal cardiovascular exam Rhythm:Regular Rate:Normal  ECG: NSR, rate 98   Neuro/Psych PSYCHIATRIC DISORDERS Anxiety Depression Bipolar Disorder ANTERIOR CERVICAL DECOMP/DISCECTOMY FUSION    GI/Hepatic Neg liver ROS, hiatal hernia, GERD  Medicated and Controlled,  Endo/Other  diabetes  Renal/GU negative Renal ROS     Musculoskeletal negative musculoskeletal ROS (+)   Abdominal   Peds  Hematology HLD   Anesthesia Other Findings abnormal CTA-thickening folds of the second and third of the duodenum abnormal weight loss  Reproductive/Obstetrics                            Anesthesia Physical Anesthesia Plan  ASA: III  Anesthesia Plan: General   Post-op Pain Management:    Induction: Intravenous  PONV Risk Score and Plan: 3 and Ondansetron, Dexamethasone and Treatment may vary due to age or medical condition  Airway Management Planned: Oral ETT  Additional Equipment:   Intra-op Plan:   Post-operative Plan: Extubation in OR  Informed Consent: I have reviewed the patients History and Physical, chart, labs and discussed the procedure including the risks, benefits and alternatives for the proposed anesthesia with the patient or authorized representative who has indicated his/her understanding and acceptance.     Dental advisory given  Plan Discussed with: CRNA  Anesthesia Plan Comments:         Anesthesia Quick Evaluation

## 2019-02-13 ENCOUNTER — Other Ambulatory Visit: Payer: Self-pay

## 2019-02-13 ENCOUNTER — Ambulatory Visit (HOSPITAL_COMMUNITY): Payer: Medicare HMO | Admitting: Certified Registered Nurse Anesthetist

## 2019-02-13 ENCOUNTER — Ambulatory Visit (HOSPITAL_COMMUNITY)
Admission: RE | Admit: 2019-02-13 | Discharge: 2019-02-13 | Disposition: A | Discharge status: Home / Self Care | Payer: Medicare HMO | Attending: Gastroenterology | Admitting: Gastroenterology

## 2019-02-13 ENCOUNTER — Encounter (HOSPITAL_COMMUNITY): Payer: Self-pay | Admitting: *Deleted

## 2019-02-13 ENCOUNTER — Telehealth: Payer: Self-pay | Admitting: Psychiatry

## 2019-02-13 ENCOUNTER — Encounter (HOSPITAL_COMMUNITY)
Admission: RE | Disposition: A | Discharge status: Home / Self Care | Payer: Self-pay | Source: Home / Self Care | Attending: Gastroenterology

## 2019-02-13 DIAGNOSIS — K3189 Other diseases of stomach and duodenum: Secondary | ICD-10-CM | POA: Insufficient documentation

## 2019-02-13 DIAGNOSIS — Z87892 Personal history of anaphylaxis: Secondary | ICD-10-CM | POA: Insufficient documentation

## 2019-02-13 DIAGNOSIS — J449 Chronic obstructive pulmonary disease, unspecified: Secondary | ICD-10-CM | POA: Diagnosis not present

## 2019-02-13 DIAGNOSIS — I129 Hypertensive chronic kidney disease with stage 1 through stage 4 chronic kidney disease, or unspecified chronic kidney disease: Secondary | ICD-10-CM | POA: Diagnosis not present

## 2019-02-13 DIAGNOSIS — N183 Chronic kidney disease, stage 3 (moderate): Secondary | ICD-10-CM | POA: Diagnosis not present

## 2019-02-13 DIAGNOSIS — F319 Bipolar disorder, unspecified: Secondary | ICD-10-CM | POA: Diagnosis not present

## 2019-02-13 DIAGNOSIS — M1990 Unspecified osteoarthritis, unspecified site: Secondary | ICD-10-CM | POA: Diagnosis not present

## 2019-02-13 DIAGNOSIS — Z87891 Personal history of nicotine dependence: Secondary | ICD-10-CM | POA: Insufficient documentation

## 2019-02-13 DIAGNOSIS — Z880 Allergy status to penicillin: Secondary | ICD-10-CM | POA: Insufficient documentation

## 2019-02-13 DIAGNOSIS — Z91048 Other nonmedicinal substance allergy status: Secondary | ICD-10-CM | POA: Insufficient documentation

## 2019-02-13 DIAGNOSIS — R933 Abnormal findings on diagnostic imaging of other parts of digestive tract: Secondary | ICD-10-CM | POA: Insufficient documentation

## 2019-02-13 DIAGNOSIS — R634 Abnormal weight loss: Secondary | ICD-10-CM | POA: Diagnosis not present

## 2019-02-13 DIAGNOSIS — E1122 Type 2 diabetes mellitus with diabetic chronic kidney disease: Secondary | ICD-10-CM | POA: Diagnosis not present

## 2019-02-13 DIAGNOSIS — R112 Nausea with vomiting, unspecified: Secondary | ICD-10-CM | POA: Diagnosis not present

## 2019-02-13 DIAGNOSIS — Z888 Allergy status to other drugs, medicaments and biological substances status: Secondary | ICD-10-CM | POA: Insufficient documentation

## 2019-02-13 DIAGNOSIS — K56609 Unspecified intestinal obstruction, unspecified as to partial versus complete obstruction: Secondary | ICD-10-CM | POA: Diagnosis not present

## 2019-02-13 DIAGNOSIS — E119 Type 2 diabetes mellitus without complications: Secondary | ICD-10-CM | POA: Diagnosis not present

## 2019-02-13 DIAGNOSIS — Z8249 Family history of ischemic heart disease and other diseases of the circulatory system: Secondary | ICD-10-CM | POA: Insufficient documentation

## 2019-02-13 DIAGNOSIS — R635 Abnormal weight gain: Secondary | ICD-10-CM | POA: Diagnosis not present

## 2019-02-13 DIAGNOSIS — Z885 Allergy status to narcotic agent status: Secondary | ICD-10-CM | POA: Insufficient documentation

## 2019-02-13 HISTORY — PX: ENTEROSCOPY: SHX5533

## 2019-02-13 HISTORY — PX: BIOPSY: SHX5522

## 2019-02-13 HISTORY — DX: Adverse effect of unspecified anesthetic, initial encounter: T41.45XA

## 2019-02-13 HISTORY — DX: Other complications of anesthesia, initial encounter: T88.59XA

## 2019-02-13 LAB — GLUCOSE, CAPILLARY
Glucose-Capillary: 72 mg/dL (ref 70–99)
Glucose-Capillary: 80 mg/dL (ref 70–99)

## 2019-02-13 SURGERY — ENTEROSCOPY
Anesthesia: General

## 2019-02-13 MED ORDER — LIDOCAINE 2% (20 MG/ML) 5 ML SYRINGE
INTRAMUSCULAR | Status: DC | PRN
  Administered 2019-02-13: 40 mg via INTRAVENOUS

## 2019-02-13 MED ORDER — FENTANYL CITRATE (PF) 100 MCG/2ML IJ SOLN
INTRAMUSCULAR | Status: DC | PRN
  Administered 2019-02-13 (×2): 50 ug via INTRAVENOUS

## 2019-02-13 MED ORDER — SUCCINYLCHOLINE CHLORIDE 200 MG/10ML IV SOSY
PREFILLED_SYRINGE | INTRAVENOUS | Status: DC | PRN
  Administered 2019-02-13: 120 mg via INTRAVENOUS

## 2019-02-13 MED ORDER — MIDAZOLAM HCL 5 MG/5ML IJ SOLN
INTRAMUSCULAR | Status: DC | PRN
  Administered 2019-02-13: 1 mg via INTRAVENOUS

## 2019-02-13 MED ORDER — ESMOLOL HCL 100 MG/10ML IV SOLN
INTRAVENOUS | Status: DC | PRN
  Administered 2019-02-13: 30 mg via INTRAVENOUS

## 2019-02-13 MED ORDER — SODIUM CHLORIDE 0.9 % IV SOLN
INTRAVENOUS | Status: DC
  Administered 2019-02-13: 1000 mL via INTRAVENOUS

## 2019-02-13 MED ORDER — PROPOFOL 10 MG/ML IV BOLUS
INTRAVENOUS | Status: DC | PRN
  Administered 2019-02-13: 40 mg via INTRAVENOUS
  Administered 2019-02-13: 20 mg via INTRAVENOUS
  Administered 2019-02-13: 140 mg via INTRAVENOUS

## 2019-02-13 MED ORDER — TRAZODONE HCL 50 MG PO TABS: ORAL_TABLET | ORAL | 0 refills | Status: DC

## 2019-02-13 MED ORDER — GLYCOPYRROLATE PF 0.2 MG/ML IJ SOSY
PREFILLED_SYRINGE | INTRAMUSCULAR | Status: DC | PRN
  Administered 2019-02-13 (×2): .1 mg via INTRAVENOUS

## 2019-02-13 MED ORDER — ONDANSETRON HCL 4 MG/2ML IJ SOLN
INTRAMUSCULAR | Status: DC | PRN
  Administered 2019-02-13: 4 mg via INTRAVENOUS

## 2019-02-13 MED ORDER — FENTANYL CITRATE (PF) 100 MCG/2ML IJ SOLN
INTRAMUSCULAR | Status: AC
  Filled 2019-02-13: qty 2

## 2019-02-13 MED ORDER — PROPOFOL 10 MG/ML IV BOLUS
INTRAVENOUS | Status: AC
  Filled 2019-02-13: qty 20

## 2019-02-13 MED ORDER — MIDAZOLAM HCL 2 MG/2ML IJ SOLN
INTRAMUSCULAR | Status: AC
  Filled 2019-02-13: qty 2

## 2019-02-13 NOTE — Telephone Encounter (Signed)
Need to review paper chart  

## 2019-02-13 NOTE — Discharge Instructions (Signed)

## 2019-02-13 NOTE — Transfer of Care (Signed)
Immediate Anesthesia Transfer of Care Note  Patient: Desiree [name]  Procedure(s) Performed: ENTEROSCOPY (N/A ) BIOPSY  Patient Location: PACU and Endoscopy Unit  Anesthesia Type:General  Level of Consciousness: awake, drowsy, patient cooperative and responds to stimulation  Airway & Oxygen Therapy: Patient Spontanous Breathing and Patient connected to face mask oxygen  Post-op Assessment: Report given to RN and Post -op Vital signs reviewed and stable  Post vital signs: Reviewed and stable  Last Vitals:  Vitals Value Taken Time  BP 119/62 02/13/2019  8:20 AM  Temp    Pulse 100 02/13/2019  8:21 AM  Resp 17 02/13/2019  8:21 AM  SpO2 92 % 02/13/2019  8:21 AM  Vitals shown include unvalidated device data.  Last Pain:  Vitals:   02/13/19 0719  TempSrc: Oral  PainSc: 0-No pain         Complications: No apparent anesthesia complications

## 2019-02-13 NOTE — Op Note (Signed)
[facility] Patient Name: Desiree MangesMary Bonk Procedure Date: 02/13/2019 MRN: [id number] Attending MD: Jeani HawkingPatrick Amayrani Bennick , MD Date of Birth: Nov 02, 1950 CSN: [id number] Age: 68 Admit Type: Outpatient Procedure:                Small bowel enteroscopy Indications:              Abnormal abdominal CT Providers:                Jeani HawkingPatrick Kjirsten Bloodgood, MD, Dwain SarnaPatricia Ford, RN, Beryle BeamsJanie Billups,                            Technician Referring MD:              Medicines:                General Anesthesia Complications:            No immediate complications. Estimated Blood Loss:     Estimated blood loss was minimal. Procedure:                Pre-Anesthesia Assessment:                           - Prior to the procedure, a History and Physical                            was performed, and patient medications and                            allergies were reviewed. The patient's tolerance of                            previous anesthesia was also reviewed. The risks                            and benefits of the procedure and the sedation                            options and risks were discussed with the patient.                            All questions were answered, and informed consent                            was obtained. Prior Anticoagulants: The patient has                            taken no previous anticoagulant or antiplatelet                            agents. ASA Grade Assessment: III - A patient with                            severe systemic disease. After reviewing the risks  and benefits, the patient was deemed in                            satisfactory condition to undergo the procedure.                           - Sedation was administered by an anesthesia                            professional. General anesthesia was attained.                           After obtaining informed consent, the endoscope was                            passed under direct vision.  Throughout the                            procedure, the patient's blood pressure, pulse, and                            oxygen saturations were monitored continuously. The                            [id number]-[id number] ([id number]) Olympus pediatric colonscope                            was introduced through the mouth and advanced to                            the small bowel distal to the Ligament of Treitz.                            The small bowel enteroscopy was accomplished                            without difficulty. The patient tolerated the                            procedure well. Scope In: Scope Out: Findings:      The esophagus was normal.      The stomach was normal.      There was no evidence of significant pathology in the first portion of       the duodenum, in the second portion of the duodenum, in the third       portion of the duodenum and in the fourth portion of the duodenum.       Biopsies were taken with a cold forceps for histology.      There was no evidence of significant pathology in the entire examined       portion of jejunum.      Two detailed passes were performed into the proximal small bowel. There       was no evidence of any luminal abnormalities, i.e., peptic disease,       ulcerations, erosions, or thickened folds. Prolonged observation of the  small bowel showed normal motility. Random biopsies of the 3rd and 4th       portions of the duodenum were obtained. Impression:               - Normal esophagus.                           - Normal stomach.                           - Normal first portion of the duodenum, second                            portion of the duodenum, third portion of the                            duodenum and fourth portion of the duodenum.                            Biopsied.                           - The examined portion of the jejunum was normal. Recommendation:           - Patient has a contact number available for                             emergencies. The signs and symptoms of potential                            delayed complications were discussed with the                            patient. Return to normal activities tomorrow.                            Written discharge instructions were provided to the                            patient.                           - Resume regular diet.                           - Await pathology results.                           - Follow up with Dr. Loreta Ave in 2-4 weeks. Procedure Code(s):        --- Professional ---                           [id number], Small intestinal endoscopy, enteroscopy                            beyond second portion of duodenum, not including  ileum; with biopsy, single or multiple Diagnosis Code(s):        --- Professional ---                           R93.3, Abnormal findings on diagnostic imaging of                            other parts of digestive tract CPT copyright 2019 [employer]. All rights reserved. The codes documented in this report are preliminary and upon coder review may  be revised to meet current compliance requirements. Jeani Hawking, MD Jeani Hawking, MD 02/13/2019 8:18:10 AM This report has been signed electronically. Number of Addenda: 0

## 2019-02-13 NOTE — Telephone Encounter (Signed)
rx submitted to pt's pharmacy

## 2019-02-13 NOTE — Telephone Encounter (Signed)
Please send in Trazodone 50mg  #30.  1-2qhs prn sleep. No RF

## 2019-02-13 NOTE — Anesthesia Procedure Notes (Signed)
Procedure Name: Intubation Date/Time: 02/13/2019 7:49 AM Performed by: Silas Sacramento, CRNA Pre-anesthesia Checklist: Patient identified, Emergency Drugs available, Suction available and Patient being monitored Patient Re-evaluated:Patient Re-evaluated prior to induction Oxygen Delivery Method: Circle system utilized Preoxygenation: Pre-oxygenation with 100% oxygen Induction Type: IV induction, Rapid sequence and Cricoid Pressure applied Ventilation: Mask ventilation without difficulty Laryngoscope Size: Mac and 4 Grade View: Grade I Tube type: Oral Number of attempts: 1 Airway Equipment and Method: Stylet and Oral airway Placement Confirmation: ETT inserted through vocal cords under direct vision,  positive ETCO2 and breath sounds checked- equal and bilateral Secured at: 21 cm Tube secured with: Tape Dental Injury: Teeth and Oropharynx as per pre-operative assessment

## 2019-02-13 NOTE — Anesthesia Postprocedure Evaluation (Signed)
Anesthesia Post Note  Patient: [name]  Procedure(s) Performed: ENTEROSCOPY (N/A ) BIOPSY     Patient location during evaluation: PACU Anesthesia Type: General Level of consciousness: awake and alert Pain management: pain level controlled Vital Signs Assessment: post-procedure vital signs reviewed and stable Respiratory status: spontaneous breathing, nonlabored ventilation, respiratory function stable and patient connected to nasal cannula oxygen Cardiovascular status: blood pressure returned to baseline and stable Postop Assessment: no apparent nausea or vomiting Anesthetic complications: no    Last Vitals:  Vitals:   02/13/19 0905 02/13/19 0910  BP:  119/80  Pulse: 92 95  Resp: 16 12  Temp:    SpO2: 96% 93%    Last Pain:  Vitals:   02/13/19 0910  TempSrc:   PainSc: 0-No pain                 Ryan P Ellender

## 2019-02-13 NOTE — Telephone Encounter (Signed)
Patient called and said that she has not slept in 24 hours. She has an appointment on 4/30 with Desiree [name]. Please give your advice and call her at [phone]. She said that she has anappointment today so you can leave a message

## 2019-02-13 NOTE — H&P (Signed)
Desiree [name] HPI: This 68 year old black female presents to the office for further evaluation of abnomal weight loss. She has a poor appetite but has lost 15 pounds in the last 9-10 weeks. She has been vomiting post-prandially. She has intermittent epigastric pain. She is currently taking Pantoprazole 40 mg a day for acid reflux. She has Ondansetron 4 mg on hand for the nausea with vomiting. She is currently taking Linzess 145 mcg on a PRN schedule. She has 1-3 BM's every 2-3 days with no obvious blood or mucus in the stool. She has also been taking IB [place] and FD Gard occasionally. She denies having any complaints of dysphagia or odynophagia. She denies having a family history of colon cancer, celiac sprue or IBD. 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 She had the tip of her left middle finger removed. She had a normal colonoscopy done on 01/05/2014. She has hyperplastic polyps removed in 2006.  An EGD was performed 06/2018 for her current complaints, but the biopsies were negative for any malignancy.  Past Medical History:  Diagnosis Date  . Anxiety   . Bipolar disorder (HCC)   . CANDIDIASIS, ESOPHAGEAL 07/28/2009   Qualifier: Diagnosis of  By: Fabian Sharp MD, Neta Mends   . Chronic kidney disease    CKD III  . Complication of anesthesia    was told she stopped breathing for one of her finger surgeries  . COPD (chronic obstructive pulmonary disease) (HCC)   . Depression   . Diabetes mellitus without complication (HCC)    no meds  . FH: colonic polyps   . Fractured elbow    right   . GERD (gastroesophageal reflux disease)   . HH (hiatus hernia)   . History of carpal tunnel syndrome   . History of chest pain   . History of transfusion of packed red blood cells   . Hyperlipidemia   . Hypertension   . Neuroleptic-induced tardive dyskinesia   . Osteoarthritis of more than one site   . Seasonal allergies     Past Surgical History:  Procedure Laterality Date  .  AMPUTATION Left 10/14/2018   Procedure: AMPUTATION LEFT LONG FINGER TIP;  Surgeon: Knute Neu, MD;  Location: [facility] OR;  Service: Plastics;  Laterality: Left;  . ANTERIOR CERVICAL DECOMP/DISCECTOMY FUSION  09/27/2016   C5-6 anterior cervical discectomy and fusion, allograft and plate/notes 94/01/7394  . ANTERIOR CERVICAL DECOMP/DISCECTOMY FUSION N/A 09/27/2016   Procedure: C5-6 Anterior Cervical Discectomy and Fusion, Allograft and Plate;  Surgeon: Eldred Manges, MD;  Location: [facility] OR;  Service: Orthopedics;  Laterality: N/A;  . Back Fusion  2002  . BIOPSY  07/19/2018   Procedure: BIOPSY;  Surgeon: Jeani Hawking, MD;  Location: [facility] ENDOSCOPY;  Service: Endoscopy;;  . CARPAL TUNNEL RELEASE  yates   left  . COLONOSCOPY N/A 01/05/2014   Procedure: COLONOSCOPY;  Surgeon: Charna Elizabeth, MD;  Location: [facility] ENDOSCOPY;  Service: Endoscopy;  Laterality: N/A;  . ELBOW SURGERY     age 17  . ESOPHAGOGASTRODUODENOSCOPY (EGD) WITH PROPOFOL N/A 07/19/2018   Procedure: ESOPHAGOGASTRODUODENOSCOPY (EGD) WITH PROPOFOL;  Surgeon: Jeani Hawking, MD;  Location[facility] ENDOSCOPY;  Service: Endoscopy;  Laterality: N/A;  . EXTERNAL EAR SURGERY Left   . EYE SURGERY     "removed white dots under eyelid"  . FINGER SURGERY Left   . Juvara osteomy    . KNEE SURGERY    . NOSE SURGERY    . Rt. toe bunion    .  skin, shave biopsy  05/03/2016   Left occipital scalp, top of scalp  . UPPER EXTREMITY ANGIOGRAPHY Bilateral 10/11/2018   Procedure: UPPER EXTREMITY ANGIOGRAPHY;  Surgeon: Sherren Kerns, MD;  Location: [facility] INVASIVE CV LAB;  Service: Cardiovascular;  Laterality: Bilateral;    Family History  Problem Relation Age of Onset  . Heart attack Father   . Heart disease Father   . Throat cancer Brother   . Diabetes Mother   . Hypertension Mother   . Heart attack Mother   . Diabetes type II Brother   . Heart disease Brother   . Lung cancer Brother   . Breast cancer Cousin   . Lung cancer Daughter   . Lung cancer Paternal  Uncle     Social History:  reports that she quit smoking about 17 years ago. Her smoking use included cigarettes. She has a 60.00 pack-year smoking history. She has never used smokeless tobacco. She reports that she does not drink alcohol or use drugs.  Allergies:  Allergies  Allergen Reactions  . Prednisone Shortness Of Breath, Itching, Nausea And Vomiting and Palpitations  . Solu-Medrol [Methylprednisolone] Anaphylaxis  . Amoxicillin Hives and Rash    Has patient had a PCN reaction causing immediate rash, facial/tongue/throat swelling, SOB or lightheadedness with hypotension:Yes Has patient had a PCN reaction causing severe rash involving mucus membranes or skin necrosis: No Has patient had a PCN reaction that required hospitalization: No Has patient had a PCN reaction occurring within the last 10 years: No If all of the above answers are "NO", then may proceed with Cephalosporin use.   . Codeine Hives, Itching and Rash    Tolerated oxycodone and morphine previously  . Hydrocodone-Acetaminophen Hives, Itching and Rash    Tolerated oxycodone and morphine previously  . Penicillins Hives, Itching and Rash    ALLERGIC REACTION TO ORAL AMOXICILLIN Has patient had a PCN reaction causing immediate rash, facial/tongue/throat swelling, SOB or lightheadedness with hypotension: Yes Has patient had a PCN reaction causing severe rash involving mucus membranes or skin necrosis: No Has patient had a PCN reaction that required hospitalization: No Has patient had a PCN reaction occurring within the last 10 years: No If all of the above answers are "NO", then may proceed with Cephalosporin use.  . Tape Other (See Comments)    sore    Medications:  Scheduled:  Continuous: . sodium chloride 1,000 mL (02/13/19 0725)    Results for orders placed or performed during the hospital encounter of 02/13/19 (from the past 24 hour(s))  Glucose, capillary     Status: None   Collection Time: 02/13/19  7:17  AM  Result Value Ref Range   Glucose-Capillary 72 70 - 99 mg/dL     No results found.  ROS:  As stated above in the HPI otherwise negative.  Blood pressure (!) 142/71, pulse 95, temperature 98 F (36.7 C), temperature source Oral, resp. rate 10, height 5\' 2"  (1.575 m), weight 51.7 kg, SpO2 100 %.    PE: Gen: NAD, Alert and Oriented HEENT:  [place]/AT, EOMI Neck: Supple, no LAD Lungs: CTA Bilaterally CV: RRR without M/G/R ABM: Soft, NTND, +BS Ext: No C/C/E  Assessment/Plan: 1) Abnormal duodenum. 2) Weight loss. 3) Nausea and abdominal pain.  Plan: 1) Enteroscopy.  Olean Sangster D 02/13/2019, 7:35 AM

## 2019-02-17 NOTE — Telephone Encounter (Signed)
Patient states her "leg is much better".  Patient declines an appointment at this time.  Patient is aware that her Ensure samples are available for pick up.

## 2019-02-17 NOTE — Telephone Encounter (Signed)
Patient is aware 

## 2019-02-20 ENCOUNTER — Other Ambulatory Visit: Payer: Self-pay

## 2019-02-20 ENCOUNTER — Encounter: Payer: Self-pay | Admitting: Physician Assistant

## 2019-02-20 ENCOUNTER — Ambulatory Visit (INDEPENDENT_AMBULATORY_CARE_PROVIDER_SITE_OTHER): Payer: Medicare HMO | Admitting: Physician Assistant

## 2019-02-20 DIAGNOSIS — F329 Major depressive disorder, single episode, unspecified: Secondary | ICD-10-CM | POA: Diagnosis not present

## 2019-02-20 DIAGNOSIS — F32A Depression, unspecified: Secondary | ICD-10-CM

## 2019-02-20 DIAGNOSIS — F411 Generalized anxiety disorder: Secondary | ICD-10-CM

## 2019-02-20 MED ORDER — DULOXETINE HCL 30 MG PO CPEP: 90.0000 mg | ORAL_CAPSULE | ORAL | 0 refills | Status: DC

## 2019-02-20 MED ORDER — TRAZODONE HCL 50 MG PO TABS: 50.0000 mg | ORAL_TABLET | Freq: Every evening | ORAL | 0 refills | Status: DC | PRN

## 2019-02-20 MED ORDER — LAMOTRIGINE 25 MG PO TABS: 25.0000 mg | ORAL_TABLET | Freq: Two times a day (BID) | ORAL | 0 refills | Status: DC

## 2019-02-20 NOTE — Progress Notes (Signed)
[facility] Check  [name],  MRN: [id number]  PCP: [name], MD  Date of Evaluation: 02/20/2019 Time spent:15 minutes  Chief Complaint:  Chief Complaint    Follow-up     Virtual Visit via Telephone Note  I connected with patient by a video enabled telemedicine application or telephone, with their informed consent, and verified patient privacy and that I am speaking with the correct person using two identifiers.  I am private, in my home and the patient is home.  I discussed the limitations, risks, security and privacy concerns of performing an evaluation and management service by telephone and the availability of in person appointments. I also discussed with the patient that there may be a patient responsible charge related to this service. The patient expressed understanding and agreed to proceed.   I discussed the assessment and treatment plan with the patient. The patient was provided an opportunity to ask questions and all were answered. The patient agreed with the plan and demonstrated an understanding of the instructions.   The patient was advised to call back or seek an in-person evaluation if the symptoms worsen or if the condition fails to improve as anticipated.  I provided 15 minutes of non-face-to-face time during this encounter.  HISTORY/CURRENT STATUS: HPI for routine med check.  Desiree [name] is a former patient of [place], [place] and is transferred to my care.  Trouble sleeping is her biggest problem.  She has trouble going to sleep as well as staying asleep.  She may sleep for a couple of hours and then wake up, stay awake for 1 to 2 hours and then fall back to sleep.  She never feels rested when she gets up.  States her mood is good even though she has a lot of health problems.  She is still dealing especially with stomach issues with alternating diarrhea and constipation.  She also has a lot of nausea and vomiting.  She is seeing her PCP for  that.  Patient denies loss of interest in usual activities and is able to enjoy things.  Denies decreased energy or motivation.  Appetite has not changed.  No extreme sadness, tearfulness, or feelings of hopelessness.  Denies any changes in concentration, making decisions or remembering things.  Denies suicidal or homicidal thoughts.  Patient denies increased energy with decreased need for sleep, no increased talkativeness, no racing thoughts, no impulsivity or risky behaviors, no increased spending, no increased libido, no grandiosity.  She denies hallucinations.  No delusions.  I see in her chart that she has a past history of hallucinations.  She states it has been a long time since that happened.  States she does have chronic joint and muscle pain.  See records from health problems within the past few months.  She denies any tremors or tics.  No gait imbalance.  She reports no falls.   Individual Medical History/ Review of Systems: Changes? :Yes See above and records on chart  Past medications for mental health diagnoses include: I do not have her paper chart in front of me and she does not remember everything she is tried.  Allergies: Prednisone; Solu-medrol [methylprednisolone]; Amoxicillin; Codeine; Hydrocodone-acetaminophen; Penicillins; and Tape  Current Medications:  Current Outpatient Medications:  .  acetaminophen (TYLENOL) 500 MG tablet, Take 2 tablets (1,000 mg total) by mouth every 6 (six) hours as needed. (Patient taking differently: Take 1,000 mg by mouth every 6 (six) hours as needed for mild pain or headache. ), Disp:  30 tablet, Rfl: 0 .  amLODipine (NORVASC) 10 MG tablet, Take 10 mg by mouth daily., Disp: , Rfl:  .  aspirin EC 81 MG tablet, Take 81 mg by mouth daily. , Disp: , Rfl:  .  atorvastatin (LIPITOR) 20 MG tablet, TAKE 1 TABLET BY MOUTH ONCE DAILY (Patient taking differently: Take 20 mg by mouth daily. ), Disp: 90 tablet, Rfl: 0 .  Blood Glucose Monitoring Suppl  (ACCU-CHEK NANO SMARTVIEW) W/DEVICE KIT, Use to check blood sugar 1-2 times daily (Patient taking differently: 1 strip by Other route 2 (two) times a week. ), Disp: 1 kit, Rfl: 0 .  cetirizine (ZYRTEC) 10 MG tablet, Take 10 mg by mouth at bedtime. , Disp: , Rfl:  .  diclofenac sodium (VOLTAREN) 1 % GEL, Apply 3 grams to 3 large joints, up to 3 times daily. (Patient taking differently: Apply 3 g topically See admin instructions. Apply 3 grams to three large joints, up to three times a day), Disp: 3 Tube, Rfl: 3 .  docusate sodium (COLACE) 100 MG capsule, Take 100 mg by mouth at bedtime. , Disp: , Rfl:  .  DULoxetine (CYMBALTA) 30 MG capsule, Take 90 mg by mouth every morning., Disp: , Rfl:  .  feeding supplement, ENSURE ENLIVE, (ENSURE ENLIVE) LIQD, Take 237 mLs by mouth 3 (three) times daily between meals. (Patient taking differently: Take 237 mLs by mouth 2 (two) times daily after a meal. ), Disp: 14 Bottle, Rfl: 0 .  fluticasone (FLONASE) 50 MCG/ACT nasal spray, Place 1-2 sprays into both nostrils at bedtime. , Disp: , Rfl:  .  glucose blood test strip, Accu-chek nano, test glucose once daily, dx E11.9, Disp: 100 each, Rfl: 12 .  guaiFENesin (MUCINEX) 600 MG 12 hr tablet, Take 600 mg by mouth at bedtime., Disp: , Rfl:  .  ipratropium-albuterol (DUONEB) 0.5-2.5 (3) MG/3ML SOLN, Take 3 mLs by nebulization 3 (three) times daily. (Patient taking differently: Take 3 mLs by nebulization 3 (three) times daily as needed (shortness of breath). ), Disp: 360 mL, Rfl: 11 .  lamoTRIgine (LAMICTAL) 25 MG tablet, TAKE 1 TABLET(25 MG) BY MOUTH THREE TIMES DAILY (Patient taking differently: Take 25 mg by mouth 2 (two) times daily. ), Disp: 270 tablet, Rfl: 0 .  Lancets Misc. (ACCU-CHEK FASTCLIX LANCET) KIT, USE TO CHECK BLOOD SUGAR 1-2 TIMES DAILY Dx E11.9, Disp: 1 kit, Rfl: 1 .  linaclotide (LINZESS) 145 MCG CAPS capsule, Take 145 mcg by mouth daily as needed (constipation)., Disp: , Rfl:  .  losartan (COZAAR) 50 MG  tablet, Take 50 mg by mouth daily. , Disp: , Rfl:  .  montelukast (SINGULAIR) 10 MG tablet, TAKE 1 TABLET BY MOUTH AT BEDTIME, Disp: 90 tablet, Rfl: 0 .  Multiple Vitamins-Minerals (CENTRUM SILVER 50+WOMEN) TABS, Take 1 tablet by mouth daily., Disp: , Rfl:  .  nitroGLYCERIN (NITRO-BID) 2 % ointment, APPLY 1/4 INCH TOPICALLY TO AFFECTED FINGERTIPS THREE TIMES DAILY (Patient taking differently: Apply 0.25 inches topically 3 (three) times daily. To affected fingertips), Disp: 30 g, Rfl: 0 .  NON FORMULARY, Respivest: Three times a day for 30 minutes, Disp: , Rfl:  .  ondansetron (ZOFRAN-ODT) 4 MG disintegrating tablet, DISSOLVE 1 TABLET(4 MG) ON THE TONGUE EVERY 8 HOURS AS NEEDED FOR NAUSEA OR VOMITING (Patient taking differently: Take 4 mg by mouth every 8 (eight) hours as needed for nausea or vomiting. ), Disp: 20 tablet, Rfl: 0 .  OVER THE COUNTER MEDICATION, Take 1 capsule by mouth See admin  instructions. Fdgard capsules: Take 1 capsule by mouth once a day, Disp: , Rfl:  .  pantoprazole (PROTONIX) 40 MG tablet, Take 40 mg by mouth daily., Disp: , Rfl:  .  Probiotic Product (ALIGN) 4 MG CAPS, Take 4 mg by mouth daily., Disp: , Rfl:  .  Respiratory Therapy Supplies (FLUTTER) DEVI, 1 Device by Does not apply route as directed. (Patient taking differently: 1 Device as directed. ), Disp: 1 each, Rfl: 0 .  sodium chloride HYPERTONIC 3 % nebulizer solution, Take by nebulization 2 (two) times daily. (Patient taking differently: Take 4 mLs by nebulization 2 (two) times daily. ), Disp: 750 mL, Rfl: 5 .  triamcinolone cream (KENALOG) 0.1 %, Apply 1 application topically daily as needed (for itching of affected areas). , Disp: , Rfl: 0 .  umeclidinium-vilanterol (ANORO ELLIPTA) 62.5-25 MCG/INH AEPB, INHALE 1 PUFF ONCE DAILY (Patient taking differently: Inhale 1 puff into the lungs daily. INHALE 1 PUFF ONCE DAILY), Disp: 60 each, Rfl: 5 .  oxyCODONE (ROXICODONE) 5 MG immediate release tablet, Take 1 tablet (5 mg  total) by mouth every 6 (six) hours as needed for severe pain. (Patient not taking: Reported on 02/20/2019), Disp: 2 tablet, Rfl: 0 Medication Side Effects: none  Family Medical/ Social History: Changes? Yes coronavirus pandemic shelter in place  [place]:  There were no vitals taken for this visit.There is no height or weight on file to calculate BMI.  General Appearance: Unable to assess  Eye Contact:  Unable to assess  Speech:  Clear and Coherent  Volume:  Normal  Mood:  Euthymic  Affect:  Unable to assess  Thought Process:  Goal Directed  Orientation:  Full (Time, Place, and Person)  Thought Content: Logical   Suicidal Thoughts:  No  Homicidal Thoughts:  No  Memory:  WNL  Judgement:  Good  Insight:  Good  Psychomotor Activity:  Unable to assess  Concentration:  Concentration: Good  Recall:  Good  Fund of Knowledge: Good  Language: Good  Assets:  Desire for Improvement  ADL's:  Intact  Cognition: WNL  Prognosis:  Good    DIAGNOSES:    ICD-10-CM   1. Depression, unspecified depression type F32.9   2. Anxiety state F41.1   History of bipolar disorder with psychosis  Receiving Psychotherapy: No    RECOMMENDATIONS:  We discussed sleep hygiene, avoid electronics within 2 hours of the time she wants to go to sleep.  No caffeine after 2 PM. Start trazodone 50 mg, 1-2 nightly as needed sleep. Continue Cymbalta 90 mg every morning. Continue Lamictal 25 mg 1 p.o. twice daily. Return in 6 weeks.  Donnal Moat, PA-C   This record has been created using [employer].  Chart creation errors have been sought, but may not always have been located and corrected. Such creation errors do not reflect on the standard of medical care.

## 2019-02-25 ENCOUNTER — Other Ambulatory Visit: Payer: Self-pay

## 2019-02-25 NOTE — Patient Outreach (Signed)
[facility]) Care Management  02/25/2019  Desiree [name] 03/08/1951 [id number]   Successful follow up call to patient to ensure receipt of life alert resources and Ensure coupons that were mailed.  Patient confirmed receipt but said that she has not contacted any of the life alert companies yet.  Patient reports being contacted by a representative from [employer] but she is unable to afford a system from them.  The list provided to patient is most inexpensive systems that could be located.  BSW is closing case at this time but encouraged patient to call if additional needs arise.   [name], BSW Social Worker [phone]

## 2019-02-27 ENCOUNTER — Telehealth: Payer: Self-pay | Admitting: *Deleted

## 2019-02-27 DIAGNOSIS — K219 Gastro-esophageal reflux disease without esophagitis: Secondary | ICD-10-CM | POA: Diagnosis not present

## 2019-02-27 DIAGNOSIS — K5900 Constipation, unspecified: Secondary | ICD-10-CM | POA: Diagnosis not present

## 2019-02-27 DIAGNOSIS — J479 Bronchiectasis, uncomplicated: Secondary | ICD-10-CM | POA: Diagnosis not present

## 2019-02-27 DIAGNOSIS — R933 Abnormal findings on diagnostic imaging of other parts of digestive tract: Secondary | ICD-10-CM | POA: Diagnosis not present

## 2019-02-27 DIAGNOSIS — R1013 Epigastric pain: Secondary | ICD-10-CM | POA: Diagnosis not present

## 2019-02-27 NOTE — Telephone Encounter (Signed)
Copied from CRM [id number]. Topic: General - Inquiry >> Feb 27, 2019  3:38 PM Reggie Pile, NT wrote: Reason for CRM: Patient is calling in stating she would like the entire office to know that she appreciated and loved the gift.

## 2019-03-01 ENCOUNTER — Other Ambulatory Visit: Payer: Self-pay | Admitting: Internal Medicine

## 2019-03-01 ENCOUNTER — Other Ambulatory Visit: Payer: Self-pay | Admitting: Rheumatology

## 2019-03-02 ENCOUNTER — Other Ambulatory Visit: Payer: Self-pay | Admitting: Internal Medicine

## 2019-03-03 ENCOUNTER — Encounter: Payer: Self-pay | Admitting: Internal Medicine

## 2019-03-03 ENCOUNTER — Other Ambulatory Visit: Payer: Self-pay

## 2019-03-03 ENCOUNTER — Ambulatory Visit (INDEPENDENT_AMBULATORY_CARE_PROVIDER_SITE_OTHER): Payer: Medicare HMO | Admitting: Internal Medicine

## 2019-03-03 DIAGNOSIS — Z79899 Other long term (current) drug therapy: Secondary | ICD-10-CM

## 2019-03-03 DIAGNOSIS — Z789 Other specified health status: Secondary | ICD-10-CM

## 2019-03-03 DIAGNOSIS — I10 Essential (primary) hypertension: Secondary | ICD-10-CM | POA: Diagnosis not present

## 2019-03-03 DIAGNOSIS — N289 Disorder of kidney and ureter, unspecified: Secondary | ICD-10-CM

## 2019-03-03 DIAGNOSIS — R634 Abnormal weight loss: Secondary | ICD-10-CM | POA: Diagnosis not present

## 2019-03-03 NOTE — Telephone Encounter (Signed)
Last Visit: 12/05/18 Next Visit: 03/11/19  Okay to refill per Dr. Corliss Skains

## 2019-03-03 NOTE — Progress Notes (Signed)
   Virtual Visit via Telephone Note  I connected with@ on 03/03/19 at  2:00 PM EDT by telephone and verified that I am speaking with the correct person using two identifiers.   I discussed the limitations, risks, security and privacy concerns of performing an evaluation and management service by telephone and the availability of in person appointments. I also discussed with the patient that there may be a patient responsible charge related to this service. The patient expressed understanding and agreed to proceed.  Location patient: home Location provider: work office Participants present for the call: patient, provider Patient did not have a visit in the prior 7 days to address this/these issue(s).   History of Present Illness: Fu about blood pressure    After stopping the amlodipine  because of lower  Blood pressure.   Now bp last reading was 126/74 at her Gi office  No new meds    Oral intake mostly   Ensure  No vomiting.   Weight is stable but low 116 was given steroids   Otherwise  NO new medication. Now seeing  New   Psych app  Still has some discomfort anterior thigh above knees   Feels like "bone pain"  ? Using cane    Observations/Objective: Patient sounds cheerful and well on the phone. I do not appreciate any SOB. Speech and thought processing are grossly intact. At baseline  Patient reported vitals:126/74 weight 116  Assessment and Plan: Essential hypertension - stay off of amlodipine and monitor  still on losartanand followed but renal spec  Medication management  Renal insufficiency  Weight loss - stabale  using ensure   Medically complex patient fortunately the spiraling weight loss is stable    Follow Up Instructions: Disc meds and  plans  For bp and support and    Fu  Plan FU rov in September    [id number] 5-10 [id number] 11-20 9443 21-30 I did not refer this patient for an OV in the next 24 hours for this/these issue(s).  I discussed the assessment and treatment  plan with the patient. The patient was provided an opportunity to ask questions and all were answered. The patient agreed with the plan and demonstrated an understanding of the instructions.   The patient was advised to call back or seek an in-person evaluation if the symptoms worsen or if the condition fails to improve as anticipated.  I provided  18 minutes of non-face-to-face time during this encounter.   Berniece Andreas, MD

## 2019-03-06 ENCOUNTER — Telehealth: Payer: Self-pay | Admitting: Physician Assistant

## 2019-03-06 ENCOUNTER — Other Ambulatory Visit: Payer: Self-pay

## 2019-03-06 DIAGNOSIS — J479 Bronchiectasis, uncomplicated: Secondary | ICD-10-CM | POA: Diagnosis not present

## 2019-03-06 DIAGNOSIS — R269 Unspecified abnormalities of gait and mobility: Secondary | ICD-10-CM | POA: Diagnosis not present

## 2019-03-06 NOTE — Patient Outreach (Signed)
[facility]) Care Management  03/06/2019  AHNIA BRUNEAU May 10, 1951 [id number]   BSW received message from the patient requesting her meal delivery be stopped. BSW attempted to contact the patient but was unsuccessful. BSW left voice message confirming receipt of message as well as details of how to contact [facility] Care Management if future needs arise.   BSW collaborated with [facility] COVID Meal Program team to update on the patients desire to end services at this time.   [name], BSW, CDP [facility] [phone]

## 2019-03-06 NOTE — Telephone Encounter (Signed)
Left voicemail to call back, instructed I would try back in the morning.

## 2019-03-06 NOTE — Telephone Encounter (Signed)
Patient called and said that the sleep medicine that teresa prescribed is not working. Please call pt with a new medicine at [phone]

## 2019-03-07 ENCOUNTER — Other Ambulatory Visit: Payer: Self-pay | Admitting: Pulmonary Disease

## 2019-03-07 ENCOUNTER — Other Ambulatory Visit: Payer: Self-pay | Admitting: Physician Assistant

## 2019-03-07 NOTE — Telephone Encounter (Signed)
Waiting to talk to pt, she called stating it's not working

## 2019-03-07 NOTE — Telephone Encounter (Signed)
Have her take Trazodone 50 mg up to 200 mg qhs prn.  Needs to try this at max dose before we change to something else.

## 2019-03-07 NOTE — Telephone Encounter (Signed)
She agrees to try the increase up to 200mg /night, discussed good sleep hygiene at bedtime and making sure she is laying down within 30 minutes of taking the medication at the most. Instructed to call back next week if not working.

## 2019-03-07 NOTE — Telephone Encounter (Signed)
Patient called and said that she I.s only sleeping 3 or 4 hours a night. She was returning your call. Please call her at [phone]

## 2019-03-11 ENCOUNTER — Ambulatory Visit: Payer: Self-pay | Admitting: Rheumatology

## 2019-03-11 ENCOUNTER — Telehealth: Payer: Self-pay

## 2019-03-11 NOTE — Telephone Encounter (Signed)
Copied from CRM [id number]. Topic: General - Other >> Mar 05, 2019 12:55 PM Jaquita Rector A wrote: Reason for CRM: Patient called to ask Dr Fabian Sharp nurse if she could please order her some Enlive Ensure vanilla for her asking for a call back at Ph# [phone]

## 2019-03-12 ENCOUNTER — Other Ambulatory Visit: Payer: Self-pay | Admitting: *Deleted

## 2019-03-12 NOTE — Telephone Encounter (Signed)
Patient calling back requesting a sample or coupon of Ensure. She would like a call back please

## 2019-03-13 ENCOUNTER — Encounter: Payer: Self-pay | Admitting: Internal Medicine

## 2019-03-13 NOTE — Telephone Encounter (Signed)
lvm for pt to call thn to get samples for them to get them shipped to [place] for her

## 2019-03-13 NOTE — Patient Outreach (Signed)
[facility]) Care Management  03/13/2019   Desiree [name] 19-Jan-1951 [id number]  RN Health Coach telephone call to patient.  [employer] compliance verified. Per patient she is getting a few groceries. Per patient she is only going to the doctor and the grocery store. Patient is in the green zone. Per patient she is feeling pretty good. Patient has agreed to follow up outreach calls.  Current Medications:  Current Outpatient Medications  Medication Sig Dispense Refill  . acetaminophen (TYLENOL) 500 MG tablet Take 2 tablets (1,000 mg total) by mouth every 6 (six) hours as needed. (Patient taking differently: Take 1,000 mg by mouth every 6 (six) hours as needed for mild pain or headache. ) 30 tablet 0  . amLODipine (NORVASC) 10 MG tablet Take 10 mg by mouth daily.    Marland Kitchen aspirin EC 81 MG tablet Take 81 mg by mouth daily.     Marland Kitchen atorvastatin (LIPITOR) 20 MG tablet TAKE 1 TABLET BY MOUTH ONCE DAILY (Patient taking differently: Take 20 mg by mouth daily. ) 90 tablet 0  . Blood Glucose Monitoring Suppl (ACCU-CHEK NANO SMARTVIEW) W/DEVICE KIT Use to check blood sugar 1-2 times daily (Patient taking differently: 1 strip by Other route 2 (two) times a week. ) 1 kit 0  . cetirizine (ZYRTEC) 10 MG tablet Take 10 mg by mouth at bedtime.     . diclofenac sodium (VOLTAREN) 1 % GEL Apply 3 grams to 3 large joints, up to 3 times daily. (Patient taking differently: Apply 3 g topically See admin instructions. Apply 3 grams to three large joints, up to three times a day) 3 Tube 3  . docusate sodium (COLACE) 100 MG capsule Take 100 mg by mouth at bedtime.     . DULoxetine (CYMBALTA) 30 MG capsule Take 3 capsules (90 mg total) by mouth every morning. 270 capsule 0  . feeding supplement, ENSURE ENLIVE, (ENSURE ENLIVE) LIQD Take 237 mLs by mouth 3 (three) times daily between meals. (Patient taking differently: Take 237 mLs by mouth 2 (two) times daily after a meal. ) 14 Bottle 0  . fluticasone (FLONASE) 50  MCG/ACT nasal spray Place 1-2 sprays into both nostrils at bedtime.     Marland Kitchen glucose blood test strip Accu-chek nano, test glucose once daily, dx E11.9 100 each 12  . guaiFENesin (MUCINEX) 600 MG 12 hr tablet Take 600 mg by mouth at bedtime.    Marland Kitchen ipratropium-albuterol (DUONEB) 0.5-2.5 (3) MG/3ML SOLN Take 3 mLs by nebulization 3 (three) times daily. (Patient taking differently: Take 3 mLs by nebulization 3 (three) times daily as needed (shortness of breath). ) 360 mL 11  . lamoTRIgine (LAMICTAL) 25 MG tablet Take 1 tablet (25 mg total) by mouth 2 (two) times daily. 180 tablet 0  . Lancets Misc. (ACCU-CHEK FASTCLIX LANCET) KIT USE TO CHECK BLOOD SUGAR 1-2 TIMES DAILY Dx E11.9 1 kit 1  . linaclotide (LINZESS) 145 MCG CAPS capsule Take 145 mcg by mouth daily as needed (constipation).    Marland Kitchen losartan (COZAAR) 50 MG tablet TAKE 1 TABLET(50 MG) BY MOUTH DAILY 90 tablet 1  . montelukast (SINGULAIR) 10 MG tablet TAKE 1 TABLET BY MOUTH AT BEDTIME 90 tablet 0  . Multiple Vitamins-Minerals (CENTRUM SILVER 50+WOMEN) TABS Take 1 tablet by mouth daily.    . nitroGLYCERIN (NITRO-BID) 2 % ointment APPLY 1/4 INCH TOPICALLY TO AFFECTED FINGERTIPS THREE TIMES DAILY 30 g 0  . NON FORMULARY Respivest: Three times a day for 30 minutes    .  ondansetron (ZOFRAN-ODT) 4 MG disintegrating tablet DISSOLVE 1 TABLET(4 MG) ON THE TONGUE EVERY 8 HOURS AS NEEDED FOR NAUSEA OR VOMITING 20 tablet 0  . OVER THE COUNTER MEDICATION Take 1 capsule by mouth See admin instructions. Fdgard capsules: Take 1 capsule by mouth once a day    . oxyCODONE (ROXICODONE) 5 MG immediate release tablet Take 1 tablet (5 mg total) by mouth every 6 (six) hours as needed for severe pain. (Patient not taking: Reported on 02/20/2019) 2 tablet 0  . pantoprazole (PROTONIX) 40 MG tablet Take 40 mg by mouth daily.    . Probiotic Product (ALIGN) 4 MG CAPS Take 4 mg by mouth daily.    Marland Kitchen Respiratory Therapy Supplies (FLUTTER) DEVI 1 Device by Does not apply route as  directed. (Patient taking differently: 1 Device as directed. ) 1 each 0  . sodium chloride HYPERTONIC 3 % nebulizer solution USE 1 VIAL VIA NEBULIZER TWICE A DAY. 240 mL 0  . traZODone (DESYREL) 50 MG tablet Take 3-4 tablets (150-200 mg total) by mouth at bedtime as needed. 90 tablet 0  . triamcinolone cream (KENALOG) 0.1 % Apply 1 application topically daily as needed (for itching of affected areas).   0  . umeclidinium-vilanterol (ANORO ELLIPTA) 62.5-25 MCG/INH AEPB INHALE 1 PUFF ONCE DAILY (Patient taking differently: Inhale 1 puff into the lungs daily. INHALE 1 PUFF ONCE DAILY) 60 each 5   No current facility-administered medications for this visit.     Functional Status:  In your present state of health, do you have any difficulty performing the following activities: 03/12/2019 02/10/2019  Hearing? N N  Vision? N N  Difficulty concentrating or making decisions? N N  Walking or climbing stairs? N N  Dressing or bathing? N N  Doing errands, shopping? N N  Preparing Food and eating ? N N  Using the Toilet? N N  In the past six months, have you accidently leaked urine? N N  Do you have problems with loss of bowel control? N N  Managing your Medications? N N  Managing your Finances? N N  Housekeeping or managing your Housekeeping? N N  Some recent data might be hidden    Fall/Depression Screening: Fall Risk  03/12/2019 02/10/2019 08/22/2018  Falls in the past year? 1 1 Yes  Number falls in past yr: 0 0 1  Injury with Fall? - 0 No  Risk for fall due to : History of fall(s);Impaired balance/gait History of fall(s);Impaired balance/gait -  Follow up - - Falls prevention discussed   PHQ 2/9 Scores 03/12/2019 02/10/2019 08/22/2018 07/22/2018 07/05/2017 01/12/2016 01/01/2015  PHQ - 2 Score 0 0 0 0 0 - 0  Exception Documentation - - - - - Other- indicate reason in comment box -  Not completed - - - - - sees  psychiatry for meds and depression -   THN CM Care Plan Problem One     Most Recent  Value  Care Plan Problem One  Knowledge Deficit in Self Management of COPD  Role Documenting the Problem One  [place] for Problem One  Active  THN Long Term Goal   Patient will not have any admissions for COPD exacerbation within the next 90 days  Interventions for Problem One Long Term Goal  RN reiterated COPD action plan. RN reiterates coronavirus safety precations. RN will follow up with further discussion  THN CM Short Term Goal #1   Patient will have a better understanding of the cornaviruse  and safety within the next 30 days  Interventions for Short Term Goal #1  RN reiterates the safety precautions as pastient goes out. RN made patient aware that she has several co morbiditis that make her more susceptible. RN will follow up with further discussion  THN CM Short Term Goal #2   Patient will verbalized eating healthier bland food within the next 30 days  Interventions for Short Term Goal #2  RN reiterates eating less spicy food. Patient has been drinking the ensure live. Patient is receiving coupons and samples from manufacture. RN will follow up with further discussion       Assessment:  Patient is wearing mask and sing coronavirus safety precautions when going out Patient is in green zone Patient is drinking ensure nutritional supplement to maintain  weight Plan:  RN reiterated coronavirus precautions.  RN discussed eating healthy RN reiterated COPD exacerbations action plan and zones RN will follow up within the month of August  Kaysha Parsell [place] [facility] [phone]

## 2019-03-14 ENCOUNTER — Telehealth: Payer: Self-pay | Admitting: Rheumatology

## 2019-03-14 NOTE — Telephone Encounter (Signed)
crPatient called stating she has been experiencing pain in both legs that are making it very difficult to stand.  Patient requested a return call.
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
## 2019-03-14 NOTE — Telephone Encounter (Signed)
Sch appt on Tuesday

## 2019-03-14 NOTE — Telephone Encounter (Signed)
Patient has been scheduled for 03/18/19 at 1:20 pm.

## 2019-03-14 NOTE — Telephone Encounter (Signed)
Patient states she is having pain bilateral legs above the knee. Patient states she is having pain in the left knee. Patient states states she is off balance going up the stairs and even on a flat surface even with a cane. Patient states this has been going on since February 2020 and getting worse. Patient states the fingertips are staying cold and states that her fingers are numb with both third fingers being worse than the others. Patient states the great toe and the second toe on her right foot are purple. Patient states it has been this way for 2-3 weeks. Patient states she has reached [facility] and has not heard back from him. Please advise.

## 2019-03-18 ENCOUNTER — Ambulatory Visit: Payer: Medicare HMO | Admitting: Physician Assistant

## 2019-03-18 ENCOUNTER — Encounter: Payer: Self-pay | Admitting: Physician Assistant

## 2019-03-18 ENCOUNTER — Other Ambulatory Visit: Payer: Self-pay

## 2019-03-18 ENCOUNTER — Ambulatory Visit (INDEPENDENT_AMBULATORY_CARE_PROVIDER_SITE_OTHER): Payer: Medicare HMO

## 2019-03-18 VITALS — BP 100/60 | HR 109 | Resp 12 | Ht 62.5 in | Wt 123.4 lb

## 2019-03-18 DIAGNOSIS — Z8639 Personal history of other endocrine, nutritional and metabolic disease: Secondary | ICD-10-CM

## 2019-03-18 DIAGNOSIS — M19071 Primary osteoarthritis, right ankle and foot: Secondary | ICD-10-CM | POA: Diagnosis not present

## 2019-03-18 DIAGNOSIS — R296 Repeated falls: Secondary | ICD-10-CM

## 2019-03-18 DIAGNOSIS — J479 Bronchiectasis, uncomplicated: Secondary | ICD-10-CM

## 2019-03-18 DIAGNOSIS — M19072 Primary osteoarthritis, left ankle and foot: Secondary | ICD-10-CM

## 2019-03-18 DIAGNOSIS — M170 Bilateral primary osteoarthritis of knee: Secondary | ICD-10-CM | POA: Diagnosis not present

## 2019-03-18 DIAGNOSIS — M25551 Pain in right hip: Secondary | ICD-10-CM

## 2019-03-18 DIAGNOSIS — R768 Other specified abnormal immunological findings in serum: Secondary | ICD-10-CM

## 2019-03-18 DIAGNOSIS — I7301 Raynaud's syndrome with gangrene: Secondary | ICD-10-CM | POA: Diagnosis not present

## 2019-03-18 DIAGNOSIS — Z79899 Other long term (current) drug therapy: Secondary | ICD-10-CM | POA: Diagnosis not present

## 2019-03-18 DIAGNOSIS — M5136 Other intervertebral disc degeneration, lumbar region: Secondary | ICD-10-CM | POA: Diagnosis not present

## 2019-03-18 DIAGNOSIS — Z8659 Personal history of other mental and behavioral disorders: Secondary | ICD-10-CM

## 2019-03-18 DIAGNOSIS — D721 Eosinophilia, unspecified: Secondary | ICD-10-CM

## 2019-03-18 DIAGNOSIS — M19041 Primary osteoarthritis, right hand: Secondary | ICD-10-CM | POA: Diagnosis not present

## 2019-03-18 DIAGNOSIS — I10 Essential (primary) hypertension: Secondary | ICD-10-CM

## 2019-03-18 DIAGNOSIS — M5030 Other cervical disc degeneration, unspecified cervical region: Secondary | ICD-10-CM | POA: Diagnosis not present

## 2019-03-18 DIAGNOSIS — M19042 Primary osteoarthritis, left hand: Secondary | ICD-10-CM

## 2019-03-18 DIAGNOSIS — Z87891 Personal history of nicotine dependence: Secondary | ICD-10-CM

## 2019-03-18 DIAGNOSIS — E782 Mixed hyperlipidemia: Secondary | ICD-10-CM

## 2019-03-18 DIAGNOSIS — M7541 Impingement syndrome of right shoulder: Secondary | ICD-10-CM

## 2019-03-19 DIAGNOSIS — D6859 Other primary thrombophilia: Secondary | ICD-10-CM | POA: Diagnosis not present

## 2019-03-19 DIAGNOSIS — I639 Cerebral infarction, unspecified: Secondary | ICD-10-CM | POA: Diagnosis not present

## 2019-03-21 ENCOUNTER — Other Ambulatory Visit: Payer: Self-pay | Admitting: Rheumatology

## 2019-03-21 ENCOUNTER — Ambulatory Visit: Payer: Medicare HMO | Admitting: Orthopaedic Surgery

## 2019-03-21 NOTE — Telephone Encounter (Signed)
Last Visit: 03/18/19 Next Visit: 08/19/19  Okay to refill per Dr. Deveshwar  

## 2019-03-25 ENCOUNTER — Telehealth: Payer: Self-pay | Admitting: *Deleted

## 2019-03-25 NOTE — Telephone Encounter (Signed)
Called pt to offer another doctor to see her pt states she will wait until Dr.panosh to come back advised pt she would be gone until next week

## 2019-03-25 NOTE — Telephone Encounter (Signed)
Copied from CRM [id number]. Topic: General - Inquiry >> Mar 25, 2019  8:51 AM Reggie Pile, NT wrote: Patient is seeking advice as she is getting some red dots. They are located on her wrist, lower part of stomach, and others have morphed from red dots to darker spots. Patient states they do not itch or hurt but she does not know what is causing them. Call back is [phone].

## 2019-03-27 DIAGNOSIS — R634 Abnormal weight loss: Secondary | ICD-10-CM | POA: Diagnosis not present

## 2019-03-27 DIAGNOSIS — R112 Nausea with vomiting, unspecified: Secondary | ICD-10-CM | POA: Diagnosis not present

## 2019-03-27 DIAGNOSIS — K219 Gastro-esophageal reflux disease without esophagitis: Secondary | ICD-10-CM | POA: Diagnosis not present

## 2019-03-27 DIAGNOSIS — K3184 Gastroparesis: Secondary | ICD-10-CM | POA: Diagnosis not present

## 2019-03-28 ENCOUNTER — Encounter: Payer: Self-pay | Admitting: Orthopaedic Surgery

## 2019-03-28 ENCOUNTER — Other Ambulatory Visit: Payer: Self-pay

## 2019-03-28 ENCOUNTER — Ambulatory Visit (INDEPENDENT_AMBULATORY_CARE_PROVIDER_SITE_OTHER): Payer: Medicare HMO | Admitting: Orthopaedic Surgery

## 2019-03-28 VITALS — Ht 64.0 in | Wt 117.0 lb

## 2019-03-28 DIAGNOSIS — M1611 Unilateral primary osteoarthritis, right hip: Secondary | ICD-10-CM

## 2019-03-28 DIAGNOSIS — M47816 Spondylosis without myelopathy or radiculopathy, lumbar region: Secondary | ICD-10-CM

## 2019-03-30 NOTE — Progress Notes (Signed)
Office Visit Note   Patient: Desiree [name]           Date of Birth: 1951/05/15           MRN: [id number] Visit Date: 03/28/2019              Requested by: Madelin Headings, MD [address] [place], [place] [address] PCP: Madelin Headings, MD   Assessment & Plan: Visit Diagnoses:  1. Facet degeneration of lumbar region   2. Unilateral primary osteoarthritis, right hip     Plan: Patient symptoms are not severe enough for her hip osteoarthritis to consider total hip arthroplasty at this time.  She did have some disc degeneration noted on MRI 2017.  We discussed that if her pain progresses in her hip with decreased range of motion and more limping she can return.  Currently she does not think her symptoms are severe enough to consider surgical intervention.  Follow-Up Instructions: Return if symptoms worsen or fail to improve.   Orders:  No orders of the defined types were placed in this encounter.  No orders of the defined types were placed in this encounter.     Procedures: No procedures performed   Clinical Data: No additional findings.   Subjective: Chief Complaint  Patient presents with  . Lower Back - Pain    HPI 68 year old female returns with problems with bilateral leg weakness symmetrical right and left with low back pain right greater than left hip pain.  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  Previous anterior cervical 2017 doing well.  She has had an arteriogram which showed occlusion of the right radial artery, occlusion of the left ulnar artery.  Total obliteration of the digital arteries 1-4 and 5 bilaterally 10/11/2018.  She has to be careful make sure she wears close when weather turns cold.  She denies associated bowel bladder symptoms with her back pain.  Does have history of affective disorder, mitral valve prolapse, bipolar disorder hyperlipidemia.  Patient doing well from her cervical fusion.   Review of Systems   Objective: Vital Signs: Ht 5\' 4"  (1.626 m)   Wt 117 lb (53.1 kg)   BMI 20.08 kg/m   Physical Exam Constitutional:      Appearance: She is well-developed.  HENT:     Head: Normocephalic.     Right Ear: External ear normal.     Left Ear: External ear normal.  Eyes:     Pupils: Pupils are equal, round, and reactive to light.  Neck:     Thyroid: No thyromegaly.     Trachea: No tracheal deviation.  Cardiovascular:     Rate and Rhythm: Normal rate.  Pulmonary:     Effort: Pulmonary effort is normal.  Abdominal:     Palpations: Abdomen is soft.  Skin:    General: Skin is warm and dry.  Neurological:     Mental Status: She is alert and oriented to person, place, and time.  Psychiatric:        Behavior: Behavior normal.     Ortho Exam knee and ankle jerk are symmetrical.  Some pain with straight leg raising right greater than left mild sciatic notch tenderness more the right than left.  She has some reproduction of her pain with internal rotation at 20 degrees right hip but is amatory without significant Trendelenburg gait.  Previous radiographs 02/2619 showed moderate osteoarthritis right hip.  Joint space narrowing  and spurring was noted.  Specialty Comments:  No specialty comments available.  Imaging: CLINICAL DATA:  Previous back surgery. Lower back pain extending to the left buttock and leg. Pain, weakness and numbness.  EXAM: MRI LUMBAR SPINE WITHOUT CONTRAST  TECHNIQUE: Multiplanar, multisequence MR imaging of the lumbar spine was performed. No intravenous contrast was administered.  COMPARISON:  MRI 10/26/2009  FINDINGS: Segmentation:  5 lumbar type vertebral bodies.  Alignment: 2 mm fixed anterolisthesis at L4-5. 2 mm anterolisthesis at L5-S1 that would likely worsen with standing or flexion.  Vertebrae:  No fracture or primary bone lesion.  Conus medullaris: Extends to the lower L1 level and appears normal.  Paraspinal and  other soft tissues: None significant.  Disc levels:  T11-12, T12-L1 and L1-2 are unremarkable.  L2-3: Mild bulging of the disc. Mild facet and ligamentous hypertrophy. No compressive stenosis.  L3-4: Mild bulging of the disc. Bilateral facet degeneration and hypertrophy. Mild narrowing of the lateral recesses without neural compression. The facet arthropathy could be painful.  L4-5: Previous diskectomy, decompression and fusion. Sufficient patency of the canal and foramina.  L5-S1: Advanced bilateral facet arthropathy with gaping, fluid-filled facet joints. 2 mm of anterolisthesis that would likely worsen with standing or flexion. 6 mm synovial cyst projecting inward from the facet on the left at Ing to encroachment upon the left lateral recess. Bilateral lateral recess and foraminal stenosis could cause neural compression.  IMPRESSION: Previous diskectomy, decompression and fusion at L4-5. Sufficient patency.  Adjacent segment degenerative disease at L3-4 with disc bulge and bilateral facet and ligamentous hypertrophy. Mild narrowing of the lateral recesses but without visible neural compression.  Advanced bilateral facet arthropathy at L5-S1 with gaping, fluid-filled facet joints. 2 mm of anterolisthesis that likely worsens with standing or flexion. 6 mm synovial cyst projecting inward from the facet on the left. Bilateral lateral recess stenosis and foraminal stenosis that could cause neural compression. This appearance would likely worsen with standing or flexion.  Compared to the previous study, the surgical changes at L4-5 are newly seen. L3-4 appears quite similar. The degenerative changes at L5-S1 have worsened markedly.   Electronically Signed   By: Paulina FusiMark  Shogry M.D.   On: 07/05/2016 19:00      PMFS History: Patient Active Problem List   Diagnosis Date Noted  . COPD mixed type (HCC) 12/26/2018  . Healthcare maintenance 12/26/2018  . History  of fall 11/05/2018  . Weight loss 11/05/2018  . Weakness 11/05/2018  . Gangrene of finger (HCC) 10/09/2018  . Raynaud's phenomenon with gangrene (HCC) 10/09/2018  . LVH (left ventricular hypertrophy) 10/09/2018  . Vasospasm (HCC) 09/06/2018  . Hypotension 08/08/2018  . Leucocytosis 08/08/2018  . Elevated troponin I level 08/08/2018  . Nausea and vomiting 08/06/2018  . Pneumonia 07/12/2018  . COPD exacerbation (HCC) 07/11/2018  . Primary osteoarthritis of both feet 12/21/2017  . DDD (degenerative disc disease), lumbar 12/21/2017  . Primary osteoarthritis of both knees 12/21/2017  . History of bilateral carpal tunnel release 12/21/2017  . DDD (degenerative disc disease), cervical 11/15/2017  . Former smoker 11/15/2017  . Bronchiectasis without complication (HCC) 10/25/2017  . Impingement syndrome of right shoulder 07/25/2017  . Hx of fusion of cervical spine 07/25/2017  . Cervical spinal stenosis 09/27/2016  . Polypharmacy 06/23/2015  . Hyperlipidemia 04/19/2015  . Visit for preventive health examination 01/01/2015  . Primary osteoarthritis involving multiple joints 01/01/2015  . Bipolar affective disorder, currently depressed, moderate (HCC)   . Bipolar I disorder with mania (HCC) 08/17/2014  .  Hypertension 10/21/2013  . Dizziness 03/25/2013  . Medication withdrawal (HCC) 03/05/2013  . Diabetes mellitus with renal manifestations, controlled (HCC) 11/10/2012  . Alopecia areata 02/06/2012  . Obesity (BMI 30-39.9) 02/06/2012  . Renal insufficiency 07/16/2011  . Neuroleptic-induced tardive dyskinesia   . Exertional dyspnea 02/07/2011  . Dyspnea on exertion 01/19/2011  . DM (diabetes mellitus), type 2 (HCC) 04/22/2010  . Carpal tunnel syndrome 02/02/2010  . CONSTIPATION 02/02/2010  . Primary osteoarthritis of both hands 02/02/2010  . CATARACTS 04/13/2009  . PAIN IN JOINT, ANKLE AND FOOT 09/16/2008  . Eosinophilia 07/21/2008  . AFFECTIVE DISORDER 04/21/2008  . BACK PAIN,  CHRONIC 02/12/2008  . LEG PAIN 02/12/2008  . ABNORMAL INVOLUNTARY MOVEMENTS 12/04/2007  . POSTURAL LIGHTHEADEDNESS 11/04/2007  . COLONIC POLYPS, HX OF 11/04/2007  . Essential hypertension 06/17/2007  . HYPERLIPIDEMIA 04/08/2007  . MITRAL VALVE PROLAPSE 04/08/2007  . GERD 04/08/2007  . LOW BACK PAIN SYNDROME 04/08/2007  . CHEST PAIN, RECURRENT 04/08/2007   Past Medical History:  Diagnosis Date  . Acute gastritis without bleeding 08/08/2018  . Anxiety   . Bipolar disorder (HCC)   . CANDIDIASIS, ESOPHAGEAL 07/28/2009   Qualifier: Diagnosis of  By: Fabian SharpPanosh MD, Neta MendsWanda K   . Chronic kidney disease    CKD III  . Complication of anesthesia    was told she stopped breathing for one of her finger surgeries  . COPD (chronic obstructive pulmonary disease) (HCC)   . Depression   . Diabetes mellitus without complication (HCC)    no meds  . FH: colonic polyps   . Fractured elbow    right   . GERD (gastroesophageal reflux disease)   . HH (hiatus hernia)   . History of carpal tunnel syndrome   . History of chest pain   . History of transfusion of packed red blood cells   . Hyperlipidemia   . Hypertension   . Neuroleptic-induced tardive dyskinesia   . Osteoarthritis of more than one site   . Seasonal allergies     Family History  Problem Relation Age of Onset  . Heart attack Father   . Heart disease Father   . Throat cancer Brother   . Diabetes Mother   . Hypertension Mother   . Heart attack Mother   . Diabetes type II Brother   . Heart disease Brother   . Lung cancer Brother   . Breast cancer Cousin   . Lung cancer Daughter   . Lung cancer Paternal Uncle     Past Surgical History:  Procedure Laterality Date  . AMPUTATION Left 10/14/2018   Procedure: AMPUTATION LEFT LONG FINGER TIP;  Surgeon: Knute Neuoley, Harrill, MD;  Location: [facility] OR;  Service: Plastics;  Laterality: Left;  . ANTERIOR CERVICAL DECOMP/DISCECTOMY FUSION  09/27/2016   C5-6 anterior cervical discectomy and fusion,  allograft and plate/notes 16/1/096012/03/2016  . ANTERIOR CERVICAL DECOMP/DISCECTOMY FUSION N/A 09/27/2016   Procedure: C5-6 Anterior Cervical Discectomy and Fusion, Allograft and Plate;  Surgeon: Eldred MangesMark C Mattheus Rauls, MD;  Location: [facility] OR;  Service: Orthopedics;  Laterality: N/A;  . Back Fusion  2002  . BIOPSY  07/19/2018   Procedure: BIOPSY;  Surgeon: Jeani HawkingHung, Patrick, MD;  [facility];  Service: Endoscopy;;  . BIOPSY  02/13/2019   Procedure: BIOPSY;  Surgeon: Jeani HawkingHung, Patrick, MD;  Location: [facility];  Service: Endoscopy;;  . CARPAL TUNNEL RELEASE  Quianna Avery   left  . COLONOSCOPY N/A 01/05/2014   Procedure: COLONOSCOPY;  Surgeon: Charna ElizabethJyothi Mann, MD;  Location: [facility];  Service:  Endoscopy;  Laterality: N/A;  . ELBOW SURGERY     age 69  . ENTEROSCOPY N/A 02/13/2019   Procedure: ENTEROSCOPY;  Surgeon: [name], MD;  Location: [facility];  Service: Endoscopy;  Laterality: N/A;  . ESOPHAGOGASTRODUODENOSCOPY (EGD) WITH PROPOFOL N/A 07/19/2018   Procedure: ESOPHAGOGASTRODUODENOSCOPY (EGD) WITH PROPOFOL;  Surgeon: [name], MD;  Location: [place];  Service: Endoscopy;  Laterality: N/A;  . EXTERNAL EAR SURGERY Left   . EYE SURGERY     "removed white dots under eyelid"  . FINGER SURGERY Left   . Juvara osteomy    . KNEE SURGERY    . NOSE SURGERY    . Rt. toe bunion    . skin, shave biopsy  05/03/2016   Left occipital scalp, top of scalp  . UPPER EXTREMITY ANGIOGRAPHY Bilateral 10/11/2018   Procedure: UPPER EXTREMITY ANGIOGRAPHY;  Surgeon: [name], MD;  Location: [place] [facility];  Service: Cardiovascular;  Laterality: Bilateral;   Social History   Occupational History  . Not on file  Tobacco Use  . Smoking status: Former Smoker    Packs/day: 2.00    Years: 30.00    Pack years: 60.00    Types: Cigarettes    Last attempt to quit: 10/23/2001    Years since quitting: 17.4  . Smokeless tobacco: Never Used  Substance and Sexual Activity  . Alcohol use: No  . Drug use: No   . Sexual activity: Not on file

## 2019-04-01 DIAGNOSIS — I63443 Cerebral infarction due to embolism of bilateral cerebellar arteries: Secondary | ICD-10-CM | POA: Diagnosis not present

## 2019-04-01 DIAGNOSIS — Z8673 Personal history of transient ischemic attack (TIA), and cerebral infarction without residual deficits: Secondary | ICD-10-CM | POA: Diagnosis not present

## 2019-04-01 DIAGNOSIS — I6523 Occlusion and stenosis of bilateral carotid arteries: Secondary | ICD-10-CM | POA: Diagnosis not present

## 2019-04-01 DIAGNOSIS — I739 Peripheral vascular disease, unspecified: Secondary | ICD-10-CM | POA: Diagnosis not present

## 2019-04-03 ENCOUNTER — Ambulatory Visit: Payer: Medicare HMO | Admitting: Physician Assistant

## 2019-04-04 ENCOUNTER — Encounter: Payer: Self-pay | Admitting: Internal Medicine

## 2019-04-04 ENCOUNTER — Ambulatory Visit (INDEPENDENT_AMBULATORY_CARE_PROVIDER_SITE_OTHER): Payer: Medicare HMO | Admitting: Internal Medicine

## 2019-04-04 ENCOUNTER — Other Ambulatory Visit: Payer: Self-pay

## 2019-04-04 DIAGNOSIS — L282 Other prurigo: Secondary | ICD-10-CM | POA: Diagnosis not present

## 2019-04-04 DIAGNOSIS — Z79899 Other long term (current) drug therapy: Secondary | ICD-10-CM

## 2019-04-04 DIAGNOSIS — J449 Chronic obstructive pulmonary disease, unspecified: Secondary | ICD-10-CM

## 2019-04-04 DIAGNOSIS — Z789 Other specified health status: Secondary | ICD-10-CM

## 2019-04-04 NOTE — Progress Notes (Signed)
Virtual Visit via Video Note  I connected with@ on 04/04/19 at  2:30 PM EDT by a video enabled telemedicine application and verified that I am speaking with the correct person using two identifiers. Location patient: home Location provider:work  office Persons participating in the virtual visit: patient, provider  Audio not good so finished visit with calling on phone.    [place]th national recommendations  regarding COVID 19 pandemic   video visit is advised over in office visit for this patient.  Patient aware  of the limitations of evaluation and management by telemedicine and  availability of in person appointments. and agreed to proceed.   HPI: Desiree [name] presents for video visit  Hx of chonric complex medical disease  And presents with concern about rash  Off and on for a couple of weeks. First seem like a sting left foot ankle ? Spider bite but  Stayed red and up leg and comes and goes   Mostly on left left and then elbows    Waxes and wanes  Not sure what hives look like so not  Able to describe these no cp sob x  Stopped all of her meds  For a few days to see if bette r!. Only new meds  singulair , biotin hair vitamins? Is off amlodipine    No inc raynaud no bleeding   Vomiting  Diarrhea and ded appetite has returned    ROS: See pertinent positives and negatives per HPI. No curren fever  Some cough ( off pulm meds)  No bleeding bruising?  Past Medical History:  Diagnosis Date  . Acute gastritis without bleeding 08/08/2018  . Anxiety   . Bipolar disorder ([place])   . CANDIDIASIS, ESOPHAGEAL 07/28/2009   Qualifier: Diagnosis of  By: [name] MD, [name]   . Chronic kidney disease    CKD III  . Complication of anesthesia    was told she stopped breathing for one of her finger surgeries  . COPD (chronic obstructive pulmonary disease) ([place])   . Depression   . Diabetes mellitus without complication (HCC)    no meds  . FH: colonic polyps   . Fractured elbow    right   . GERD  (gastroesophageal reflux disease)   . HH (hiatus hernia)   . History of carpal tunnel syndrome   . History of chest pain   . History of transfusion of packed red blood cells   . Hyperlipidemia   . Hypertension   . Neuroleptic-induced tardive dyskinesia   . Osteoarthritis of more than one site   . Seasonal allergies     Past Surgical History:  Procedure Laterality Date  . AMPUTATION Left 10/14/2018   Procedure: AMPUTATION LEFT LONG FINGER TIP;  Surgeon: Dayna Barker, MD;  Location: [place];  Service: Plastics;  Laterality: Left;  . ANTERIOR CERVICAL DECOMP/DISCECTOMY FUSION  09/27/2016   C5-6 anterior cervical discectomy and fusion, allograft and plate/notes 09/27/2016  . ANTERIOR CERVICAL DECOMP/DISCECTOMY FUSION N/A 09/27/2016   Procedure: C5-6 Anterior Cervical Discectomy and Fusion, Allograft and Plate;  Surgeon: [name], MD;  Location: [place];  Service: Orthopedics;  Laterality: N/A;  . Back Fusion  2002  . BIOPSY  07/19/2018   Procedure: BIOPSY;  Surgeon: [name], MD;  Location: [facility] ENDOSCOPY;  Service: Endoscopy;;  . BIOPSY  02/13/2019   Procedure: BIOPSY;  Surgeon: [name], MD;  Location: [facility] ENDOSCOPY;  Service: Endoscopy;;  . CARPAL TUNNEL RELEASE  yates   left  .  COLONOSCOPY N/A 01/05/2014   Procedure: COLONOSCOPY;  Surgeon: [name], MD;  Location: [facility] ENDOSCOPY;  Service: Endoscopy;  Laterality: N/A;  . ELBOW SURGERY     age 89  . ENTEROSCOPY N/A 02/13/2019   Procedure: ENTEROSCOPY;  Surgeon: [name], MD;  Location: [facility] ENDOSCOPY;  Service: Endoscopy;  Laterality: N/A;  . ESOPHAGOGASTRODUODENOSCOPY (EGD) WITH PROPOFOL N/A 07/19/2018   Procedure: ESOPHAGOGASTRODUODENOSCOPY (EGD) WITH PROPOFOL;  Surgeon: [name], MD;  Location: [place];  Service: Endoscopy;  Laterality: N/A;  . EXTERNAL EAR SURGERY Left   . EYE SURGERY     "removed white dots under eyelid"  . FINGER SURGERY Left   . Juvara osteomy    . KNEE SURGERY    . NOSE SURGERY    . Rt.  toe bunion    . skin, shave biopsy  05/03/2016   Left occipital scalp, top of scalp  . UPPER EXTREMITY ANGIOGRAPHY Bilateral 10/11/2018   Procedure: UPPER EXTREMITY ANGIOGRAPHY;  Surgeon: [name], MD;  Location: [place] CV LAB;  Service: Cardiovascular;  Laterality: Bilateral;    Family History  Problem Relation Age of Onset  . Heart attack Father   . Heart disease Father   . Throat cancer Brother   . Diabetes Mother   . Hypertension Mother   . Heart attack Mother   . Diabetes type II Brother   . Heart disease Brother   . Lung cancer Brother   . Breast cancer Cousin   . Lung cancer Daughter   . Lung cancer Paternal Uncle     Social History   Tobacco Use  . Smoking status: Former Smoker    Packs/day: 2.00    Years: 30.00    Pack years: 60.00    Types: Cigarettes    Quit date: 10/23/2001    Years since quitting: 17.4  . Smokeless tobacco: Never Used  Substance Use Topics  . Alcohol use: No  . Drug use: No      Current Outpatient Medications:  .  acetaminophen (TYLENOL) 500 MG tablet, Take 2 tablets (1,000 mg total) by mouth every 6 (six) hours as needed. (Patient taking differently: Take 1,000 mg by mouth every 6 (six) hours as needed for mild pain or headache. ), Disp: 30 tablet, Rfl: 0 .  aspirin EC 81 MG tablet, Take 81 mg by mouth daily. , Disp: , Rfl:  .  atorvastatin (LIPITOR) 20 MG tablet, TAKE 1 TABLET BY MOUTH ONCE DAILY (Patient taking differently: Take 20 mg by mouth daily. ), Disp: 90 tablet, Rfl: 0 .  Biotin w/ Vitamins C & E (HAIR/SKIN/NAILS PO), Take by mouth., Disp: , Rfl:  .  Blood Glucose Monitoring Suppl (ACCU-CHEK NANO SMARTVIEW) W/DEVICE KIT, Use to check blood sugar 1-2 times daily (Patient taking differently: 1 strip by Other route 2 (two) times a week. ), Disp: 1 kit, Rfl: 0 .  cetirizine (ZYRTEC) 10 MG tablet, Take 10 mg by mouth at bedtime. , Disp: , Rfl:  .  Cyanocobalamin (VITAMIN B 12 PO), Take 50 mcg by mouth., Disp: , Rfl:  .   diclofenac sodium (VOLTAREN) 1 % GEL, Apply 3 grams to 3 large joints, up to 3 times daily. (Patient taking differently: Apply 3 g topically See admin instructions. Apply 3 grams to three large joints, up to three times a day), Disp: 3 Tube, Rfl: 3 .  docusate sodium (COLACE) 100 MG capsule, Take 100 mg by mouth at bedtime. , Disp: , Rfl:  .  DULoxetine (  CYMBALTA) 30 MG capsule, Take 3 capsules (90 mg total) by mouth every morning., Disp: 270 capsule, Rfl: 0 .  feeding supplement, ENSURE ENLIVE, (ENSURE ENLIVE) LIQD, Take 237 mLs by mouth 3 (three) times daily between meals. (Patient taking differently: Take 237 mLs by mouth 2 (two) times daily after a meal. ), Disp: 14 Bottle, Rfl: 0 .  fluticasone (FLONASE) 50 MCG/ACT nasal spray, Place 1-2 sprays into both nostrils at bedtime. , Disp: , Rfl:  .  glucose blood test strip, Accu-chek nano, test glucose once daily, dx E11.9, Disp: 100 each, Rfl: 12 .  guaiFENesin (MUCINEX) 600 MG 12 hr tablet, Take 600 mg by mouth at bedtime., Disp: , Rfl:  .  ipratropium-albuterol (DUONEB) 0.5-2.5 (3) MG/3ML SOLN, Take 3 mLs by nebulization 3 (three) times daily. (Patient taking differently: Take 3 mLs by nebulization 3 (three) times daily as needed (shortness of breath). ), Disp: 360 mL, Rfl: 11 .  Iron Combinations (IRON COMPLEX PO), Take by mouth., Disp: , Rfl:  .  lamoTRIgine (LAMICTAL) 25 MG tablet, Take 1 tablet (25 mg total) by mouth 2 (two) times daily., Disp: 180 tablet, Rfl: 0 .  Lancets Misc. (ACCU-CHEK FASTCLIX LANCET) KIT, USE TO CHECK BLOOD SUGAR 1-2 TIMES DAILY Dx E11.9, Disp: 1 kit, Rfl: 1 .  linaclotide (LINZESS) 145 MCG CAPS capsule, Take 145 mcg by mouth daily as needed (constipation)., Disp: , Rfl:  .  losartan (COZAAR) 50 MG tablet, TAKE 1 TABLET(50 MG) BY MOUTH DAILY, Disp: 90 tablet, Rfl: 1 .  Multiple Vitamins-Minerals (CENTRUM SILVER 50+WOMEN) TABS, Take 1 tablet by mouth daily., Disp: , Rfl:  .  nitroGLYCERIN (NITRO-BID) 2 % ointment, APPLY  1/4 INCH TOPICALLY TO AFFECTED FINGERTIPS 3 TIMES DAILY, Disp: 30 g, Rfl: 0 .  NON FORMULARY, Respivest: Three times a day for 30 minutes, Disp: , Rfl:  .  ondansetron (ZOFRAN-ODT) 4 MG disintegrating tablet, DISSOLVE 1 TABLET(4 MG) ON THE TONGUE EVERY 8 HOURS AS NEEDED FOR NAUSEA OR VOMITING, Disp: 20 tablet, Rfl: 0 .  OVER THE COUNTER MEDICATION, Take 1 capsule by mouth See admin instructions. Fdgard capsules: Take 1 capsule by mouth once a day, Disp: , Rfl:  .  oxyCODONE (ROXICODONE) 5 MG immediate release tablet, Take 1 tablet (5 mg total) by mouth every 6 (six) hours as needed for severe pain., Disp: 2 tablet, Rfl: 0 .  pantoprazole (PROTONIX) 40 MG tablet, Take 40 mg by mouth daily., Disp: , Rfl:  .  Probiotic Product (ALIGN) 4 MG CAPS, Take 4 mg by mouth daily., Disp: , Rfl:  .  Respiratory Therapy Supplies (FLUTTER) DEVI, 1 Device by Does not apply route as directed. (Patient taking differently: 1 Device as directed. ), Disp: 1 each, Rfl: 0 .  sodium chloride HYPERTONIC 3 % nebulizer solution, USE 1 VIAL VIA NEBULIZER TWICE A DAY., Disp: 240 mL, Rfl: 0 .  traZODone (DESYREL) 50 MG tablet, Take 3-4 tablets (150-200 mg total) by mouth at bedtime as needed., Disp: 90 tablet, Rfl: 0 .  triamcinolone cream (KENALOG) 0.1 %, Apply 1 application topically daily as needed (for itching of affected areas). , Disp: , Rfl: 0 .  umeclidinium-vilanterol (ANORO ELLIPTA) 62.5-25 MCG/INH AEPB, INHALE 1 PUFF ONCE DAILY (Patient taking differently: Inhale 1 puff into the lungs daily. INHALE 1 PUFF ONCE DAILY), Disp: 60 each, Rfl: 5 .  Vitamin D, Ergocalciferol, (DRISDOL) 1.25 MG (50000 UT) CAPS capsule, Take 50,000 Units by mouth every 7 (seven) days., Disp: , Rfl:  .  montelukast (SINGULAIR) 10 MG tablet, TAKE 1 TABLET BY MOUTH AT BEDTIME (Patient not taking: Reported on 04/04/2019), Disp: 90 tablet, Rfl: 0  EXAM: BP Readings from Last 3 Encounters:  03/18/19 100/60  12/26/18 (!) 84/52  12/10/18 (!) 97/54     VITALS per patient if applicable:  GENERAL: alert, oriented, appears well and in no acute distress  HEENT: atraumatic, conjunttiva clear, no obvious abnormalities on inspection of external nose and ears  NECK: normal movements of the head and neck  LUNGS: on inspection no signs of respiratory distress, breathing rate appears normal, no obvious gross SOB, gasping or wheezing ocass cough when talking   CV: no obvious cyanosis  PSYCH/NEURO: pleasant and cooperative, no obvious depression or anxiety, speech and thought processing grossly intact   ASSESSMENT AND PLAN:  Discussed the following assessment and plan: 1. Pruritic rash 2. Medically complex patient 3. COPD mixed type ([place])   uncertain cause of rash not flaring now but sounds like hives or some sort or allergic rash that comes and goes .  dont have visual of rash  Always have eo be concerned about  Drug causes   Do not have a good picture of  Causes of The many  Active   Problems    Get back on lung med and inhalers and Protonix     Stop the losartan and sty off the vitamins and supplements for now   Get back on zyrtec bid and add on benadryl if needed with caution.   Note if worse when eats or other  Timing of rash  And take picture as possible   Counseled.   Has had multiple specialists and ?s about  Labs and scans  Had  Brain strokes and never new and uncertain wh y certain testing  .   reveiw of record shows she is having    Advised that echo is most likely to be  Done  In the work up for poss embolic ( mini strokes)  Read report  To her found on care every where ( after much searching)    She is to get another echo  Now ( last in fall 2019  ) but at [facility] team  And hopefully adequate follow up .   Plan to  Fu in person or   VV in about 2 weeks  To  Check rash  And meds and fu .    Expectant management and discussion of plan and treatment with opportunity to ask questions and all were answered. The  patient agreed with the plan and demonstrated an understanding of the instructions.   Advised to call back or seek an in-person evaluation if worsening  or having  further concerns . I interim   I provided  35 minutes of non-face-to-face time during this encounter. More than 50 % in counsleing and med management    Shanon Ace, MD

## 2019-04-06 DIAGNOSIS — R269 Unspecified abnormalities of gait and mobility: Secondary | ICD-10-CM | POA: Diagnosis not present

## 2019-04-06 DIAGNOSIS — J479 Bronchiectasis, uncomplicated: Secondary | ICD-10-CM | POA: Diagnosis not present

## 2019-04-07 ENCOUNTER — Ambulatory Visit: Payer: Medicare HMO | Admitting: Internal Medicine

## 2019-04-11 ENCOUNTER — Other Ambulatory Visit: Payer: Self-pay | Admitting: Physician Assistant

## 2019-04-15 ENCOUNTER — Ambulatory Visit: Payer: Medicare HMO | Admitting: Pulmonary Disease

## 2019-04-17 DIAGNOSIS — I517 Cardiomegaly: Secondary | ICD-10-CM | POA: Diagnosis not present

## 2019-04-23 ENCOUNTER — Ambulatory Visit: Payer: Medicare HMO | Admitting: Rheumatology

## 2019-04-29 DIAGNOSIS — Z0189 Encounter for other specified special examinations: Secondary | ICD-10-CM | POA: Diagnosis not present

## 2019-05-05 ENCOUNTER — Other Ambulatory Visit: Payer: Self-pay | Admitting: Internal Medicine

## 2019-05-05 ENCOUNTER — Other Ambulatory Visit: Payer: Self-pay | Admitting: Rheumatology

## 2019-05-05 NOTE — Telephone Encounter (Signed)
Last Visit: 03/18/19 Next Visit: 08/19/19  Okay to refill per Dr. Deveshwar  

## 2019-05-07 ENCOUNTER — Other Ambulatory Visit: Payer: Self-pay

## 2019-05-07 MED ORDER — MONTELUKAST SODIUM 10 MG PO TABS: 10.0000 mg | ORAL_TABLET | Freq: Every day | ORAL | 0 refills | Status: DC

## 2019-05-11 ENCOUNTER — Other Ambulatory Visit: Payer: Self-pay | Admitting: Internal Medicine

## 2019-05-13 ENCOUNTER — Other Ambulatory Visit: Payer: Self-pay | Admitting: *Deleted

## 2019-05-13 NOTE — Patient Outreach (Signed)
[facility]) Care Management  05/13/2019  MARGY SUMLER 1951-05-10 [id number]  RN Health Coach received telephone call from patient. Patient was wanting to know if transportation could be set up for her to go drop her car off at [employer] and get to the Dr office. Or Drop her car afterwards and get a ride home.  Plan: RN explained to patient that services are to go to the Dr office and home. RN suggested that the patient call the car service center and see if they have a shuttle service because most places are very accommodating.   Patient called back and stated they the car service center would indeed carry her home from dropping off her car and pick her up when it was ready. Patient was very pleased with outcome and stated she didn't even think to ask the dealership about this.  Patient asked where she could get some reusable masks [facility] mailed 2 masks to patient   [facility] Management [phone]

## 2019-05-15 DIAGNOSIS — D6859 Other primary thrombophilia: Secondary | ICD-10-CM | POA: Diagnosis not present

## 2019-05-15 DIAGNOSIS — D721 Eosinophilia: Secondary | ICD-10-CM | POA: Diagnosis not present

## 2019-05-27 ENCOUNTER — Other Ambulatory Visit: Payer: Self-pay | Admitting: Internal Medicine

## 2019-05-27 DIAGNOSIS — H04123 Dry eye syndrome of bilateral lacrimal glands: Secondary | ICD-10-CM | POA: Diagnosis not present

## 2019-05-27 DIAGNOSIS — H532 Diplopia: Secondary | ICD-10-CM | POA: Diagnosis not present

## 2019-05-27 DIAGNOSIS — H40021 Open angle with borderline findings, high risk, right eye: Secondary | ICD-10-CM | POA: Diagnosis not present

## 2019-05-27 DIAGNOSIS — E119 Type 2 diabetes mellitus without complications: Secondary | ICD-10-CM | POA: Diagnosis not present

## 2019-05-28 DIAGNOSIS — N183 Chronic kidney disease, stage 3 (moderate): Secondary | ICD-10-CM | POA: Diagnosis not present

## 2019-05-29 ENCOUNTER — Telehealth: Payer: Self-pay

## 2019-05-29 ENCOUNTER — Ambulatory Visit (INDEPENDENT_AMBULATORY_CARE_PROVIDER_SITE_OTHER): Payer: Medicare HMO | Admitting: Podiatry

## 2019-05-29 ENCOUNTER — Ambulatory Visit (INDEPENDENT_AMBULATORY_CARE_PROVIDER_SITE_OTHER): Payer: Medicare HMO

## 2019-05-29 ENCOUNTER — Other Ambulatory Visit: Payer: Self-pay

## 2019-05-29 DIAGNOSIS — M2041 Other hammer toe(s) (acquired), right foot: Secondary | ICD-10-CM

## 2019-05-29 DIAGNOSIS — M21611 Bunion of right foot: Secondary | ICD-10-CM

## 2019-05-29 DIAGNOSIS — E1169 Type 2 diabetes mellitus with other specified complication: Secondary | ICD-10-CM | POA: Diagnosis not present

## 2019-05-29 DIAGNOSIS — B351 Tinea unguium: Secondary | ICD-10-CM | POA: Diagnosis not present

## 2019-05-29 DIAGNOSIS — L84 Corns and callosities: Secondary | ICD-10-CM

## 2019-05-29 DIAGNOSIS — E1151 Type 2 diabetes mellitus with diabetic peripheral angiopathy without gangrene: Secondary | ICD-10-CM | POA: Diagnosis not present

## 2019-05-29 DIAGNOSIS — M205X1 Other deformities of toe(s) (acquired), right foot: Secondary | ICD-10-CM | POA: Diagnosis not present

## 2019-05-29 DIAGNOSIS — I7301 Raynaud's syndrome with gangrene: Secondary | ICD-10-CM

## 2019-05-29 NOTE — Telephone Encounter (Signed)
Please advise 

## 2019-05-29 NOTE — Progress Notes (Signed)
Subjective:  [name], female    DOB: 17-Dec-1950,  MRN: [id number]  Chief Complaint  Patient presents with  . Bunions    right great toe- last seen in 2018    68 y.o. female presents with the above complaint.  States that she had to cancel her bunion surgery.  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  She is having pain in the bunion on her right foot.  She is also having pain in her calluses and request care of her nails today.  She is diabetic for the last A1c of 4.9.   Review of Systems: Negative except as noted in the HPI. Denies N/V/F/Ch.  Past Medical History:  Diagnosis Date  . Acute gastritis without bleeding 08/08/2018  . Anxiety   . Bipolar disorder ([place])   . CANDIDIASIS, ESOPHAGEAL 07/28/2009   Qualifier: Diagnosis of  By: [name] MD, [name]   . Chronic kidney disease    CKD III  . Complication of anesthesia    was told she stopped breathing for one of her finger surgeries  . COPD (chronic obstructive pulmonary disease) ([place])   . Depression   . Diabetes mellitus without complication (HCC)    no meds  . FH: colonic polyps   . Fractured elbow    right   . GERD (gastroesophageal reflux disease)   . HH (hiatus hernia)   . History of carpal tunnel syndrome   . History of chest pain   . History of transfusion of packed red blood cells   . Hyperlipidemia   . Hypertension   . Neuroleptic-induced tardive dyskinesia   . Osteoarthritis of more than one site   . Seasonal allergies     Current Outpatient Medications:  .  acetaminophen (TYLENOL) 500 MG tablet, Take 2 tablets (1,000 mg total) by mouth every 6 (six) hours as needed. (Patient taking differently: Take 1,000 mg by mouth every 6 (six) hours as needed for mild pain or headache. ), Disp: 30 tablet, Rfl: 0 .  aspirin EC 81 MG tablet, Take 81 mg by mouth daily. , Disp: , Rfl:  .  atorvastatin (LIPITOR) 20 MG tablet,  TAKE 1 TABLET BY MOUTH ONCE DAILY (Patient taking differently: Take 20 mg by mouth daily. ), Disp: 90 tablet, Rfl: 0 .  Biotin w/ Vitamins C & E (HAIR/SKIN/NAILS PO), Take by mouth., Disp: , Rfl:  .  Blood Glucose Monitoring Suppl (ACCU-CHEK NANO SMARTVIEW) W/DEVICE KIT, Use to check blood sugar 1-2 times daily (Patient taking differently: 1 strip by Other route 2 (two) times a week. ), Disp: 1 kit, Rfl: 0 .  cetirizine (ZYRTEC) 10 MG tablet, Take 10 mg by mouth at bedtime. , Disp: , Rfl:  .  Cyanocobalamin (VITAMIN B 12 PO), Take 50 mcg by mouth., Disp: , Rfl:  .  diclofenac sodium (VOLTAREN) 1 % GEL, APPLY 2-4 GRAMS TO AFFECTED JOINTS UP TO FOUR TIMES DAILY, Disp: 400 g, Rfl: 2 .  docusate sodium (COLACE) 100 MG capsule, Take 100 mg by mouth at bedtime. , Disp: , Rfl:  .  DULoxetine (CYMBALTA) 30 MG capsule, Take 3 capsules (90 mg total) by mouth every morning., Disp: 270 capsule, Rfl: 0 .  feeding supplement, ENSURE ENLIVE, (ENSURE ENLIVE) LIQD, Take 237 mLs by mouth 3 (three) times daily between meals. (Patient taking differently: Take 237 mLs by mouth 2 (two) times daily after a meal. ), Disp:  14 Bottle, Rfl: 0 .  fluticasone (FLONASE) 50 MCG/ACT nasal spray, Place 1-2 sprays into both nostrils at bedtime. , Disp: , Rfl:  .  glucose blood test strip, Accu-chek nano, test glucose once daily, dx E11.9, Disp: 100 each, Rfl: 12 .  guaiFENesin (MUCINEX) 600 MG 12 hr tablet, Take 600 mg by mouth at bedtime., Disp: , Rfl:  .  ipratropium-albuterol (DUONEB) 0.5-2.5 (3) MG/3ML SOLN, Take 3 mLs by nebulization 3 (three) times daily. (Patient taking differently: Take 3 mLs by nebulization 3 (three) times daily as needed (shortness of breath). ), Disp: 360 mL, Rfl: 11 .  Iron Combinations (IRON COMPLEX PO), Take by mouth., Disp: , Rfl:  .  lamoTRIgine (LAMICTAL) 25 MG tablet, Take 1 tablet (25 mg total) by mouth 2 (two) times daily., Disp: 180 tablet, Rfl: 0 .  Lancets Misc. (ACCU-CHEK FASTCLIX LANCET) KIT,  USE TO CHECK BLOOD SUGAR 1-2 TIMES DAILY Dx E11.9, Disp: 1 kit, Rfl: 1 .  linaclotide (LINZESS) 145 MCG CAPS capsule, Take 145 mcg by mouth daily as needed (constipation)., Disp: , Rfl:  .  losartan (COZAAR) 50 MG tablet, TAKE 1 TABLET(50 MG) BY MOUTH DAILY, Disp: 90 tablet, Rfl: 1 .  montelukast (SINGULAIR) 10 MG tablet, Take 1 tablet (10 mg total) by mouth at bedtime., Disp: 90 tablet, Rfl: 0 .  Multiple Vitamins-Minerals (CENTRUM SILVER 50+WOMEN) TABS, Take 1 tablet by mouth daily., Disp: , Rfl:  .  nitroGLYCERIN (NITRO-BID) 2 % ointment, APPLY 1/4 INCH TOPICALLY TO AFFECTED FINGERTIPS 3 TIMES DAILY, Disp: 30 g, Rfl: 0 .  NON FORMULARY, Respivest: Three times a day for 30 minutes, Disp: , Rfl:  .  ondansetron (ZOFRAN-ODT) 4 MG disintegrating tablet, DISSOLVE 1 TABLET(4 MG) ON THE TONGUE EVERY 8 HOURS AS NEEDED FOR NAUSEA OR VOMITING, Disp: 20 tablet, Rfl: 0 .  OVER THE COUNTER MEDICATION, Take 1 capsule by mouth See admin instructions. Fdgard capsules: Take 1 capsule by mouth once a day, Disp: , Rfl:  .  oxyCODONE (ROXICODONE) 5 MG immediate release tablet, Take 1 tablet (5 mg total) by mouth every 6 (six) hours as needed for severe pain., Disp: 2 tablet, Rfl: 0 .  pantoprazole (PROTONIX) 40 MG tablet, Take 40 mg by mouth daily., Disp: , Rfl:  .  Probiotic Product (ALIGN) 4 MG CAPS, Take 4 mg by mouth daily., Disp: , Rfl:  .  Respiratory Therapy Supplies (FLUTTER) DEVI, 1 Device by Does not apply route as directed. (Patient taking differently: 1 Device as directed. ), Disp: 1 each, Rfl: 0 .  sodium chloride HYPERTONIC 3 % nebulizer solution, USE 1 VIAL VIA NEBULIZER TWICE A DAY., Disp: 240 mL, Rfl: 0 .  traZODone (DESYREL) 50 MG tablet, TAKE 3 TO 4 TABLETS(150 TO 200 MG) BY MOUTH AT BEDTIME AS NEEDED, Disp: 90 tablet, Rfl: 2 .  triamcinolone cream (KENALOG) 0.1 %, Apply 1 application topically daily as needed (for itching of affected areas). , Disp: , Rfl: 0 .  umeclidinium-vilanterol (ANORO  ELLIPTA) 62.5-25 MCG/INH AEPB, INHALE 1 PUFF ONCE DAILY (Patient taking differently: Inhale 1 puff into the lungs daily. INHALE 1 PUFF ONCE DAILY), Disp: 60 each, Rfl: 5 .  Vitamin D, Ergocalciferol, (DRISDOL) 1.25 MG (50000 UT) CAPS capsule, Take 50,000 Units by mouth every 7 (seven) days., Disp: , Rfl:   Social History   Tobacco Use  Smoking Status Former Smoker  . Packs/day: 2.00  . Years: 30.00  . Pack years: 60.00  . Types: Cigarettes  . Quit date:  10/23/2001  . Years since quitting: 17.6  Smokeless Tobacco Never Used    Allergies  Allergen Reactions  . Prednisone Shortness Of Breath, Itching, Nausea And Vomiting and Palpitations  . Solu-Medrol [Methylprednisolone] Anaphylaxis  . Amoxicillin Hives and Rash    Has patient had a PCN reaction causing immediate rash, facial/tongue/throat swelling, SOB or lightheadedness with hypotension:Yes Has patient had a PCN reaction causing severe rash involving mucus membranes or skin necrosis: No Has patient had a PCN reaction that required hospitalization: No Has patient had a PCN reaction occurring within the last 10 years: No If all of the above answers are "NO", then may proceed with Cephalosporin use.   . Codeine Hives, Itching and Rash    Tolerated oxycodone and morphine previously  . Hydrocodone-Acetaminophen Hives, Itching and Rash    Tolerated oxycodone and morphine previously  . Penicillins Hives, Itching and Rash    ALLERGIC REACTION TO ORAL AMOXICILLIN Has patient had a PCN reaction causing immediate rash, facial/tongue/throat swelling, SOB or lightheadedness with hypotension: Yes Has patient had a PCN reaction causing severe rash involving mucus membranes or skin necrosis: No Has patient had a PCN reaction that required hospitalization: No Has patient had a PCN reaction occurring within the last 10 years: No If all of the above answers are "NO", then may proceed with Cephalosporin use.  . Tape Other (See Comments)    sore    Objective:  There were no vitals filed for this visit. There is no height or weight on file to calculate BMI. Constitutional Well developed. Well nourished.  Vascular Dorsalis pedis pulses palpable bilaterally. Posterior tibial pulses non-palpable bilaterally. Capillary refill normal to all digits.  No cyanosis or clubbing noted. Pedal hair growth absent.  Neurologic Normal speech. Oriented to person, place, and time. Epicritic sensation to light touch grossly present bilaterally.  Dermatologic Nails elongated thickened dystrophic. No open wounds. Hyperkeratoses left sub-met 1 5 right hallux IPJ sub-met 135  Orthopedic:  Right HAV deformity with pain palpation and pain and crepitus on range of motion first MPJ hammertoe deformity second toe   Radiographs: Right severe hallux valgus deformity evidence of pin fixation joint degenerative changes Assessment:   1. Bunion of great toe of right foot   2. Onychomycosis   3. Onychomycosis of multiple toenails with type 2 diabetes mellitus and peripheral angiopathy (HCC)   4. Callus   5. Hallux limitus, right   6. 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 Raynaud's phenomenon with gangrene [facility])    Plan:  Patient was evaluated and treated and all questions answered.  HAV/Hallux Limitus Right, hammertoe right -XR reviewed -Poor surgical candidate given new Dx of Raynaud's. -Injection as below -Dispense large toe spacer to prevent for second toe rubbing  Procedure: Joint Injection Location: Right 1st MPJ joint Skin Prep: Alcohol. Injectate: 0.5 cc 1% lidocaine plain, 0.5 cc dexamethasone phosphate. Disposition: Patient tolerated procedure well. Injection site dressed with a band-aid.  Diabetes with PAD, Onychomycosis -Educated on diabetic footcare. Diabetic risk level 1 -Nails x10 debrided sharply and manually with large nail nipper and rotary burr.  -Calluses pared x6  Procedure: Nail Debridement Rationale: Patient meets  criteria for routine foot care due to PAD Type of Debridement: manual, sharp debridement. Instrumentation: Nail nipper, rotary burr. Number of Nails: 10  Procedure: Paring of Lesion Rationale: painful hyperkeratotic lesion Type of Debridement: manual, sharp debridement. Instrumentation: 312 blade Number of Lesions: 6   No follow-ups on file.

## 2019-05-29 NOTE — Telephone Encounter (Signed)
PATIENT CALLED IN WANTING TO SEE IF SHE COULD GET A WALKER THAT HAS A SEAT ATTACHED TO IT. IF AND WHEN APPROVED PLEASE GIVE HER A CALL LETTING HER KNOW ITS DONE.   PATIENT ALSO WANTS YOU TO KNOW HER THIGHS ARE STILL IN PAIN AS WELL AS HER ANKLES REASON SHE IS REQUESTING WALKER.   PLEASE ADVISE

## 2019-05-30 ENCOUNTER — Telehealth: Payer: Self-pay | Admitting: Orthopaedic Surgery

## 2019-05-30 ENCOUNTER — Telehealth: Payer: Self-pay | Admitting: Podiatry

## 2019-05-30 NOTE — Telephone Encounter (Signed)
Pt asked when she should wear the toe separator and if one would be enough. I told pt that the toe separators could be reused and could be purchased at the front desk. Pt asked cost and I told her I would have to have a receptionist call her with the cost.

## 2019-05-30 NOTE — Telephone Encounter (Signed)
Patient saw Dr. March Rummage and she has a toe separator and wanted to know does she wear it at all times.

## 2019-05-30 NOTE — Telephone Encounter (Signed)
Please advise 

## 2019-05-30 NOTE — Telephone Encounter (Signed)
Patient called left voicemail message stating she went to her kidney doctor and was told she have to much protein in her blood. Patient said she has kidney problems. Patient also asked for a walker with a seat. The number to contact patient is [phone]

## 2019-06-01 NOTE — Telephone Encounter (Signed)
Ok, thanks.

## 2019-06-03 ENCOUNTER — Telehealth: Payer: Self-pay | Admitting: Radiology

## 2019-06-03 NOTE — Telephone Encounter (Signed)
Called patient, aware Rx for rolling walker with seat is ready for pick up at front desk.

## 2019-06-03 NOTE — Telephone Encounter (Signed)
IC advised completed and could pick up at front desk 

## 2019-06-06 DIAGNOSIS — R269 Unspecified abnormalities of gait and mobility: Secondary | ICD-10-CM | POA: Diagnosis not present

## 2019-06-06 DIAGNOSIS — J479 Bronchiectasis, uncomplicated: Secondary | ICD-10-CM | POA: Diagnosis not present

## 2019-06-12 DIAGNOSIS — N183 Chronic kidney disease, stage 3 (moderate): Secondary | ICD-10-CM | POA: Diagnosis not present

## 2019-06-13 ENCOUNTER — Other Ambulatory Visit: Payer: Self-pay | Admitting: *Deleted

## 2019-06-13 NOTE — Patient Outreach (Signed)
[place] [facility]) Care Management  06/13/2019   Desiree [name] [birth year] [id number]  RN Health Coach telephone call to patient.  Hipaa compliance verified. Per patient she is doing good. She had to use her nebulizer yesterday and today.   Per patient she is having some clear phlegm and a little difficulty breathing. She is using the nebulizer. Patient stated she is feeling much better. Patient is following pandemic safety precautions. Patient has agreed to further outreach calls.   Current Medications:  Current Outpatient Medications  Medication Sig Dispense Refill  . acetaminophen (TYLENOL) 500 MG tablet Take 2 tablets (1,000 mg total) by mouth every 6 (six) hours as needed. (Patient taking differently: Take 1,000 mg by mouth every 6 (six) hours as needed for mild pain or headache. ) 30 tablet 0  . aspirin EC 81 MG tablet Take 81 mg by mouth daily.     Marland Kitchen atorvastatin (LIPITOR) 20 MG tablet TAKE 1 TABLET BY MOUTH ONCE DAILY (Patient taking differently: Take 20 mg by mouth daily. ) 90 tablet 0  . Biotin w/ Vitamins C & E (HAIR/SKIN/NAILS PO) Take by mouth.    . Blood Glucose Monitoring Suppl (ACCU-CHEK NANO SMARTVIEW) W/DEVICE KIT Use to check blood sugar 1-2 times daily (Patient taking differently: 1 strip by Other route 2 (two) times a week. ) 1 kit 0  . cetirizine (ZYRTEC) 10 MG tablet Take 10 mg by mouth at bedtime.     . Cyanocobalamin (VITAMIN B 12 PO) Take 50 mcg by mouth.    . diclofenac sodium (VOLTAREN) 1 % GEL APPLY 2-4 GRAMS TO AFFECTED JOINTS UP TO FOUR TIMES DAILY 400 g 2  . docusate sodium (COLACE) 100 MG capsule Take 100 mg by mouth at bedtime.     . DULoxetine (CYMBALTA) 30 MG capsule Take 3 capsules (90 mg total) by mouth every morning. 270 capsule 0  . feeding supplement, ENSURE ENLIVE, (ENSURE ENLIVE) LIQD Take 237 mLs by mouth 3 (three) times daily between meals. (Patient taking differently: Take 237 mLs by mouth 2 (two) times daily after a meal. ) 14 Bottle 0   . fluticasone (FLONASE) 50 MCG/ACT nasal spray Place 1-2 sprays into both nostrils at bedtime.     Marland Kitchen glucose blood test strip Accu-chek nano, test glucose once daily, dx E11.9 100 each 12  . guaiFENesin (MUCINEX) 600 MG 12 hr tablet Take 600 mg by mouth at bedtime.    Marland Kitchen ipratropium-albuterol (DUONEB) 0.5-2.5 (3) MG/3ML SOLN Take 3 mLs by nebulization 3 (three) times daily. (Patient taking differently: Take 3 mLs by nebulization 3 (three) times daily as needed (shortness of breath). ) 360 mL 11  . Iron Combinations (IRON COMPLEX PO) Take by mouth.    . lamoTRIgine (LAMICTAL) 25 MG tablet Take 1 tablet (25 mg total) by mouth 2 (two) times daily. 180 tablet 0  . Lancets Misc. (ACCU-CHEK FASTCLIX LANCET) KIT USE TO CHECK BLOOD SUGAR 1-2 TIMES DAILY Dx E11.9 1 kit 1  . linaclotide (LINZESS) 145 MCG CAPS capsule Take 145 mcg by mouth daily as needed (constipation).    Marland Kitchen losartan (COZAAR) 50 MG tablet TAKE 1 TABLET(50 MG) BY MOUTH DAILY 90 tablet 1  . montelukast (SINGULAIR) 10 MG tablet Take 1 tablet (10 mg total) by mouth at bedtime. 90 tablet 0  . Multiple Vitamins-Minerals (CENTRUM SILVER 50+WOMEN) TABS Take 1 tablet by mouth daily.    . nitroGLYCERIN (NITRO-BID) 2 % ointment APPLY 1/4 INCH TOPICALLY TO AFFECTED FINGERTIPS 3 TIMES DAILY  30 g 0  . NON FORMULARY Respivest: Three times a day for 30 minutes    . ondansetron (ZOFRAN-ODT) 4 MG disintegrating tablet DISSOLVE 1 TABLET(4 MG) ON THE TONGUE EVERY 8 HOURS AS NEEDED FOR NAUSEA OR VOMITING 20 tablet 0  . OVER THE COUNTER MEDICATION Take 1 capsule by mouth See admin instructions. Fdgard capsules: Take 1 capsule by mouth once a day    . oxyCODONE (ROXICODONE) 5 MG immediate release tablet Take 1 tablet (5 mg total) by mouth every 6 (six) hours as needed for severe pain. 2 tablet 0  . pantoprazole (PROTONIX) 40 MG tablet Take 40 mg by mouth daily.    . Probiotic Product (ALIGN) 4 MG CAPS Take 4 mg by mouth daily.    Marland Kitchen Respiratory Therapy Supplies  (FLUTTER) DEVI 1 Device by Does not apply route as directed. (Patient taking differently: 1 Device as directed. ) 1 each 0  . sodium chloride HYPERTONIC 3 % nebulizer solution USE 1 VIAL VIA NEBULIZER TWICE A DAY. 240 mL 0  . traZODone (DESYREL) 50 MG tablet TAKE 3 TO 4 TABLETS(150 TO 200 MG) BY MOUTH AT BEDTIME AS NEEDED 90 tablet 2  . triamcinolone cream (KENALOG) 0.1 % Apply 1 application topically daily as needed (for itching of affected areas).   0  . umeclidinium-vilanterol (ANORO ELLIPTA) 62.5-25 MCG/INH AEPB INHALE 1 PUFF ONCE DAILY (Patient taking differently: Inhale 1 puff into the lungs daily. INHALE 1 PUFF ONCE DAILY) 60 each 5  . Vitamin D, Ergocalciferol, (DRISDOL) 1.25 MG (50000 UT) CAPS capsule Take 50,000 Units by mouth every 7 (seven) days.     No current facility-administered medications for this visit.     Functional Status:  In your present state of health, do you have any difficulty performing the following activities: 06/13/2019 03/12/2019  Hearing? N N  Vision? N N  Difficulty concentrating or making decisions? N N  Walking or climbing stairs? N N  Dressing or bathing? N N  Doing errands, shopping? N N  Preparing Food and eating ? N N  Using the Toilet? N N  In the past six months, have you accidently leaked urine? N N  Do you have problems with loss of bowel control? N N  Managing your Medications? N N  Managing your Finances? N N  Housekeeping or managing your Housekeeping? N N  Some recent data might be hidden    Fall/Depression Screening: Fall Risk  06/13/2019 03/12/2019 02/10/2019  Falls in the past year? 1 1 1   Number falls in past yr: 0 0 0  Injury with Fall? 0 - 0  Risk for fall due to : History of fall(s);Impaired balance/gait;Impaired mobility History of fall(s);Impaired balance/gait History of fall(s);Impaired balance/gait  Follow up Falls evaluation completed;Falls prevention discussed;Education provided - -   PHQ 2/9 Scores 06/13/2019 03/12/2019  02/10/2019 08/22/2018 07/22/2018 07/05/2017 01/12/2016  PHQ - 2 Score 0 0 0 0 0 0 -  Exception Documentation - - - - - - Other- indicate reason in comment box  Not completed - - - - - - sees  psychiatry for meds and depression   THN CM Care Plan Problem One     Most Recent Value  Care Plan Problem One  Knowledge Deficit in Self Management of COPD  Role Documenting the Problem One  [place] for Problem One  Active  THN Long Term Goal   Patient will not have any admissions for COPD exacerbation within the next 90  days  Interventions for Problem One Long Term Goal  RN reiterates medication adherence. RN reiterates the zones and action plan of COPD. RN will follow up with further discussion  THN CM Short Term Goal #1   Patient will have a better understanding of the cornaviruse and safety within the next 30 days  Interventions for Short Term Goal #1  RN reiterates the need to continue pandemic safety precautions. RN will follow up with further discussions  THN CM Short Term Goal #2   Patient will verbalized eating healthier bland food within the next 30 days  Interventions for Short Term Goal #2  RN discussed eating healthier. Patient has an  increase in EBT card and is able to get more healthier food. RN will follow up for further discussion      Assessment:  Patient has some clear phlegm and coughing Patient used nebulizer Using pandemic safety precautions Patient is trying to eat healthy   Plan:  RN reiterated the zones and action plan for COPD RN reiterated pandemic safety precautions RN discussed healthy eating RN will follow up within the month of November  Brittini Brubeck [place] [facility] [phone]

## 2019-06-20 ENCOUNTER — Telehealth: Payer: Self-pay | Admitting: *Deleted

## 2019-06-20 DIAGNOSIS — M19071 Primary osteoarthritis, right ankle and foot: Secondary | ICD-10-CM

## 2019-06-20 DIAGNOSIS — M19072 Primary osteoarthritis, left ankle and foot: Secondary | ICD-10-CM

## 2019-06-20 NOTE — Telephone Encounter (Signed)
Copied from [place] [id number]. Topic: General - Other >> Jun 20, 2019 12:05 PM Desiree [name], [place] C wrote: Reason for CRM: pt called in to request a referral to  [facility]. Pt says that she has an apt with them in October. Pt has already seen provider but says that her insurance wont cover because she dont have referral    Please assist.

## 2019-06-24 NOTE — Telephone Encounter (Signed)
Is this podiatry  ? If so then ok to refer

## 2019-06-25 ENCOUNTER — Telehealth: Payer: Self-pay

## 2019-06-25 NOTE — Telephone Encounter (Signed)
Referral has been placed. 

## 2019-06-25 NOTE — Telephone Encounter (Signed)
lvm for pt letting her know it will be a virtual visit for her visit Friday. Crm created okay to disclose

## 2019-06-27 ENCOUNTER — Other Ambulatory Visit: Payer: Self-pay

## 2019-06-27 ENCOUNTER — Ambulatory Visit (INDEPENDENT_AMBULATORY_CARE_PROVIDER_SITE_OTHER): Payer: Medicare HMO | Admitting: Internal Medicine

## 2019-06-27 ENCOUNTER — Telehealth: Payer: Self-pay | Admitting: Internal Medicine

## 2019-06-27 DIAGNOSIS — E8809 Other disorders of plasma-protein metabolism, not elsewhere classified: Secondary | ICD-10-CM | POA: Diagnosis not present

## 2019-06-27 DIAGNOSIS — D649 Anemia, unspecified: Secondary | ICD-10-CM | POA: Diagnosis not present

## 2019-06-27 DIAGNOSIS — N289 Disorder of kidney and ureter, unspecified: Secondary | ICD-10-CM | POA: Diagnosis not present

## 2019-06-27 DIAGNOSIS — R634 Abnormal weight loss: Secondary | ICD-10-CM | POA: Diagnosis not present

## 2019-06-27 DIAGNOSIS — D6859 Other primary thrombophilia: Secondary | ICD-10-CM

## 2019-06-27 DIAGNOSIS — R630 Anorexia: Secondary | ICD-10-CM | POA: Diagnosis not present

## 2019-06-27 DIAGNOSIS — Z79899 Other long term (current) drug therapy: Secondary | ICD-10-CM

## 2019-06-27 DIAGNOSIS — E46 Unspecified protein-calorie malnutrition: Secondary | ICD-10-CM

## 2019-06-27 DIAGNOSIS — Z789 Other specified health status: Secondary | ICD-10-CM

## 2019-06-27 NOTE — Telephone Encounter (Signed)
Copied from [place] [phone]. Topic: General - Other >> Jun 27, 2019  2:32 PM Celene Kras A wrote: Reason for CRM: Pt called stating that her stomach doctor told her that PCP has to decide what to do concerning her energy and appetite. Please advise.

## 2019-06-27 NOTE — Progress Notes (Addendum)
Chief Complaint  Patient presents with  . Medication Management    multiple  fu  . Follow-up    continue anorexia low energy and  nausea    Virtual Visit via Video Note  I connected with@ on 06/27/2019 at  1:45 PM EDT by a video enabled telemedicine application and verified that I am speaking with the correct person using two identifiers. Location patient: home Location provider:work or home office Persons participating in the virtual visit: patient, provider  [place] national recommendations  regarding COVID 19 pandemic   video visit is advised over in office visit for this patient.  Patient aware  of the limitations of evaluation and management by telemedicine and  availability of in person appointments. and agreed to proceed.   HPI: Desiree [name] presents for video visit  [name] 68 y.o. come in for Chronic disease management  Has complexing sx   Seen dr Humphrey Rolls  Hematology  And dx  Prot S defic see below    Assessment and plan: 68 year old female with 1. Reactive leukocytosis eosinophilia: Patient has had extensive work-up performed at [place] [facility]. And has been finally determined that she most likely has reactive eosinophilia. CBC today shows white count 16.7 eosinophils are 11.5. We will continue to monitor her. She remains completely asymptomatic. 2. 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 We will repeat her levels. She has no personal history of thrombosis. We will however need to monitor her carefully. 4. Follow-up: Patient will be seen back in 6 months time.    Off bp meds  Since low bp  To be seeing dr Posey Pronto for renal  insufficiency  Pulmonary next week stable  On inhaler  Weight loss and no appetite  And sometimes  doesn't eat .  Remote hx   Of  Vomiting  And  zofran helps taking bid   But prefers  The dissolvable  Had a evaluation per dr Youlanda Mighty office    And ? No dx?    She gets right upper q  sx at times   Also   No change in psych meds  Should she take Iron? Is she still anemic ? Vision ok checking with eye drop Hearing Ok  raynaud no new sx     ROS: See pertinent positives and negatives per HPI.  Past Medical History:  Diagnosis Date  . Acute gastritis without bleeding 08/08/2018  . Anxiety   . Bipolar disorder ([place])   . CANDIDIASIS, ESOPHAGEAL 07/28/2009   Qualifier: Diagnosis of  By: [name] MD, [name]   . Chronic kidney disease    CKD III  . Complication of anesthesia    was told she stopped breathing for one of her finger surgeries  . COPD (chronic obstructive pulmonary disease) ([place])   . Depression   . Diabetes mellitus without complication (HCC)    no meds  . FH: colonic polyps   . Fractured elbow    right   . GERD (gastroesophageal reflux disease)   . HH (hiatus hernia)   . History of carpal tunnel syndrome   . History of chest pain   . History of transfusion of packed red blood cells   . Hyperlipidemia   . Hypertension   . Neuroleptic-induced tardive dyskinesia   . Osteoarthritis of more than one site   . Seasonal allergies     Past Surgical History:  Procedure Laterality Date  . AMPUTATION  Left 10/14/2018   Procedure: AMPUTATION LEFT LONG FINGER TIP;  Surgeon: Dayna Barker, MD;  Location: [facility];  Service: Plastics;  Laterality: Left;  . ANTERIOR CERVICAL DECOMP/DISCECTOMY FUSION  09/27/2016   C5-6 anterior cervical discectomy and fusion, allograft and plate/notes 09/27/2016  . ANTERIOR CERVICAL DECOMP/DISCECTOMY FUSION N/A 09/27/2016   Procedure: C5-6 Anterior Cervical Discectomy and Fusion, Allograft and Plate;  Surgeon: [name], MD;  Location: [facility];  Service: Orthopedics;  Laterality: N/A;  . Back Fusion  2002  . BIOPSY  07/19/2018   Procedure: BIOPSY;  Surgeon: [name], MD;  Location: [facility] ENDOSCOPY;  Service: Endoscopy;;  . BIOPSY  02/13/2019   Procedure: BIOPSY;  Surgeon: [name], MD;  Location: [facility] ENDOSCOPY;  Service:  Endoscopy;;  . CARPAL TUNNEL RELEASE  yates   left  . COLONOSCOPY N/A 01/05/2014   Procedure: COLONOSCOPY;  Surgeon: [name], MD;  Location: [facility] ENDOSCOPY;  Service: Endoscopy;  Laterality: N/A;  . ELBOW SURGERY     age 62  . ENTEROSCOPY N/A 02/13/2019   Procedure: ENTEROSCOPY;  Surgeon: [name], MD;  Location: [facility] ENDOSCOPY;  Service: Endoscopy;  Laterality: N/A;  . ESOPHAGOGASTRODUODENOSCOPY (EGD) WITH PROPOFOL N/A 07/19/2018   Procedure: ESOPHAGOGASTRODUODENOSCOPY (EGD) WITH PROPOFOL;  Surgeon: [name], MD;  Location: [facility];  Service: Endoscopy;  Laterality: N/A;  . EXTERNAL EAR SURGERY Left   . EYE SURGERY     "removed white dots under eyelid"  . FINGER SURGERY Left   . Juvara osteomy    . KNEE SURGERY    . NOSE SURGERY    . Rt. toe bunion    . skin, shave biopsy  05/03/2016   Left occipital scalp, top of scalp  . UPPER EXTREMITY ANGIOGRAPHY Bilateral 10/11/2018   Procedure: UPPER EXTREMITY ANGIOGRAPHY;  Surgeon: [name], MD;  Location: [facility] CV LAB;  Service: Cardiovascular;  Laterality: Bilateral;    Family History  Problem Relation Age of Onset  . Heart attack Father   . Heart disease Father   . Throat cancer Brother   . Diabetes Mother   . Hypertension Mother   . Heart attack Mother   . Diabetes type II Brother   . Heart disease Brother   . Lung cancer Brother   . Breast cancer Cousin   . Lung cancer Daughter   . Lung cancer Paternal Uncle     Social History   Tobacco Use  . Smoking status: Former Smoker    Packs/day: 2.00    Years: 30.00    Pack years: 60.00    Types: Cigarettes    Quit date: 10/23/2001    Years since quitting: 17.7  . Smokeless tobacco: Never Used  Substance Use Topics  . Alcohol use: No  . Drug use: No      Current Outpatient Medications:  .  acetaminophen (TYLENOL) 500 MG tablet, Take 2 tablets (1,000 mg total) by mouth every 6 (six) hours as needed. (Patient taking differently: Take 1,000 mg by  mouth every 6 (six) hours as needed for mild pain or headache. ), Disp: 30 tablet, Rfl: 0 .  aspirin EC 81 MG tablet, Take 81 mg by mouth daily. , Disp: , Rfl:  .  Blood Glucose Monitoring Suppl (ACCU-CHEK NANO SMARTVIEW) W/DEVICE KIT, Use to check blood sugar 1-2 times daily (Patient taking differently: 1 strip by Other route 2 (two) times a week. ), Disp: 1 kit, Rfl: 0 .  cetirizine (ZYRTEC) 10 MG tablet, Take 10 mg by  mouth at bedtime. , Disp: , Rfl:  .  diclofenac sodium (VOLTAREN) 1 % GEL, APPLY 2-4 GRAMS TO AFFECTED JOINTS UP TO FOUR TIMES DAILY, Disp: 400 g, Rfl: 2 .  DULoxetine (CYMBALTA) 30 MG capsule, Take 3 capsules (90 mg total) by mouth every morning., Disp: 270 capsule, Rfl: 0 .  feeding supplement, ENSURE ENLIVE, (ENSURE ENLIVE) LIQD, Take 237 mLs by mouth 3 (three) times daily between meals. (Patient taking differently: Take 237 mLs by mouth 2 (two) times daily after a meal. ), Disp: 14 Bottle, Rfl: 0 .  fluticasone (FLONASE) 50 MCG/ACT nasal spray, Place 1-2 sprays into both nostrils at bedtime. , Disp: , Rfl:  .  glucose blood test strip, Accu-chek nano, test glucose once daily, dx E11.9, Disp: 100 each, Rfl: 12 .  ipratropium-albuterol (DUONEB) 0.5-2.5 (3) MG/3ML SOLN, Take 3 mLs by nebulization 3 (three) times daily. (Patient taking differently: Take 3 mLs by nebulization 3 (three) times daily as needed (shortness of breath). ), Disp: 360 mL, Rfl: 11 .  lamoTRIgine (LAMICTAL) 25 MG tablet, Take 1 tablet (25 mg total) by mouth 2 (two) times daily., Disp: 180 tablet, Rfl: 0 .  Lancets Misc. (ACCU-CHEK FASTCLIX LANCET) KIT, USE TO CHECK BLOOD SUGAR 1-2 TIMES DAILY Dx E11.9, Disp: 1 kit, Rfl: 1 .  linaclotide (LINZESS) 145 MCG CAPS capsule, Take 145 mcg by mouth daily as needed (constipation)., Disp: , Rfl:  .  Multiple Vitamins-Minerals (CENTRUM SILVER 50+WOMEN) TABS, Take 1 tablet by mouth daily., Disp: , Rfl:  .  nitroGLYCERIN (NITRO-BID) 2 % ointment, APPLY 1/4 INCH TOPICALLY TO  AFFECTED FINGERTIPS 3 TIMES DAILY, Disp: 30 g, Rfl: 0 .  NON FORMULARY, Respivest: Three times a day for 30 minutes, Disp: , Rfl:  .  ondansetron (ZOFRAN-ODT) 4 MG disintegrating tablet, DISSOLVE 1 TABLET(4 MG) ON THE TONGUE EVERY 8 HOURS AS NEEDED FOR NAUSEA OR VOMITING, Disp: 20 tablet, Rfl: 0 .  pantoprazole (PROTONIX) 40 MG tablet, Take 40 mg by mouth daily., Disp: , Rfl:  .  Probiotic Product (ALIGN) 4 MG CAPS, Take 4 mg by mouth daily., Disp: , Rfl:  .  Respiratory Therapy Supplies (FLUTTER) DEVI, 1 Device by Does not apply route as directed. (Patient taking differently: 1 Device as directed. ), Disp: 1 each, Rfl: 0 .  sodium chloride HYPERTONIC 3 % nebulizer solution, USE 1 VIAL VIA NEBULIZER TWICE A DAY., Disp: 240 mL, Rfl: 0 .  traZODone (DESYREL) 50 MG tablet, TAKE 3 TO 4 TABLETS(150 TO 200 MG) BY MOUTH AT BEDTIME AS NEEDED, Disp: 90 tablet, Rfl: 2 .  triamcinolone cream (KENALOG) 0.1 %, Apply 1 application topically daily as needed (for itching of affected areas). , Disp: , Rfl: 0 .  umeclidinium-vilanterol (ANORO ELLIPTA) 62.5-25 MCG/INH AEPB, INHALE 1 PUFF ONCE DAILY (Patient taking differently: Inhale 1 puff into the lungs daily. INHALE 1 PUFF ONCE DAILY), Disp: 60 each, Rfl: 5  EXAM: BP Readings from Last 3 Encounters:  07/09/19 121/70  03/18/19 100/60  12/26/18 (!) 84/52    VITALS per patient if applicable:states weigth wasa 116  GENERAL: alert, oriented, appears well and in no acute distress looks  Down   But cognitively intact   HEENT: atraumatic, conjunttiva clear, no obvious abnormalities on inspection of external nose and ears NECK: normal movements of the head and neck LUNGS: on inspection no signs of respiratory distress, breathing rate appears normal, no obvious gross SOB, gasping or wheezing no cough during  Visit  CV: no obvious cyanosis  PSYCH/NEURO: pleasant and cooperative, some slowing of speech   momild flattening of  affect but good eye  Contact and  thought processing grossly intact Lab Results  Component Value Date   WBC 19.3 01/20/2019   HGB 10.7 (A) 01/20/2019   HCT 31 (A) 01/20/2019   PLT 373 01/20/2019   GLUCOSE 89 12/03/2018   CHOL 168 08/07/2017   TRIG 74.0 08/07/2017   HDL 64.50 08/07/2017   LDLDIRECT 146.3 01/26/2010   LDLCALC 89 08/07/2017   ALT 20 12/03/2018   AST 54 (H) 12/03/2018   NA 133 (A) 01/20/2019   K 4.1 01/20/2019   CL 97 12/03/2018   CREATININE 1.3 (A) 01/20/2019   BUN 48 (H) 12/03/2018   CO2 22 12/03/2018   TSH 2.153 10/10/2018   INR 1.16 10/09/2018   HGBA1C 4.9 10/10/2018   MICROALBUR 3.1 (H) 12/31/2015    ASSESSMENT AND PLAN:  Discussed the following assessment and plan:    ICD-10-CM   1. Anorexia  R63.0   2. Weight loss  R63.4   3. Medication management  Z79.899   4. Medically complex patient  Z78.9   5. Anemia, unspecified type  D64.9    hx of asks about iron ifneeded  6. Renal insufficiency  N28.9   7. Protein S deficiency ([place])  D68.59   8. Hypoalbuminemia  E88.09   9. Protein-calorie malnutrition, unspecified severity ([place])  E46   Complex set of history and complaints.  At this point she is most concerned about her continued anorexia and secondary low energy which is not new occasional vomiting taking Zofran prefers to dissolvable. She is under care by hematology GI rheumatology and pulmonary.  And nephrology.  At advise she touch base with Dr. Collene Mares about the anorexia as I do not have all of the recent notes on her evaluation.  We will have to review the records she has questions about iron and whether she should be taking it. In the last year she has discontinued her hypertensive medicines because of low blood pressure and her weight loss. Her nodes appears to be stable by report. Told her I will have to review the records about further advice.  If there are no specific medicines that will change her appetite and suspect her "low energy" is from her multiple conditions medications  and perhaps whatever is causing her weight loss.   After visit was finished she called to report the Dr. Lorie Apley office relate that her problem with weight loss and anorexia should be handled by PCP. We will review chart and see if can update records.   Not sure how much is her meds  Disease process and how much is mood and how much is g but she does have malnourishment.   Evaluation apparently included an endoscopy with small bowel look after an abdominal CT discussed some excess folds or difficulties with the second and third part of the duodenum. She also had an abdominal ultrasound which was suspicious for some thickening of her gallbladder but her gallbladder ejection fraction was perfectly normal at 99%. It is unclear whether she had GI motility studies at this time.   Her blood studies done through hematology showed resolution of her anemia.  Counseled.   Expectant management and discussion of plan and treatment with opportunity to ask questions and all were answered. The patient agreed with the plan and demonstrated an understanding of the instructions.   Advised to call back or seek an in-person evaluation if worsening  or  having  further concerns .  I provided 45 minutes of non-face-to-face time  Video during this encounter.   Shanon Ace, MD   Component Name 05/15/2019 01/30/2019   136 136  4.6 4.0  104 104  27 26  29  (H) 15  83 86  1.77 (H) 1.16  9.0 8.7  8.8 (H) 8.7 (H)  2.9 (L) 2.7 (L)  0.3 0.5  65 97  23 26  14 13  5 6   34 (L) 57 (L)  Sodium  Potassium  Chloride  CO2  BUN  Glucose  Creatinine  Calcium  Total Protein  Albumin   Total Bilirubin  Alkaline Phosphatase  AST (SGOT)  ALT (SGPT)  Anion Gap  Est. GFR African American    BP Readings from Last 3 Encounters:  07/09/19 121/70  03/18/19 100/60  12/26/18 (!) 84/52    ASSESSMENT AND PLAN:  Discussed the following assessment and plan:  Anorexia  Weight loss  Medication management   Medically complex patient  Anemia, unspecified type - hx of asks about iron ifneeded  Renal insufficiency  Protein S deficiency (HCC)  Hypoalbuminemia  Protein-calorie malnutrition, unspecified severity ([place])  There are no Patient Instructions on file for this visit.  See above  [name]. Natalyn Szymanowski M.D.

## 2019-07-01 ENCOUNTER — Ambulatory Visit: Payer: Medicare HMO | Admitting: Pulmonary Disease

## 2019-07-01 ENCOUNTER — Ambulatory Visit: Payer: Self-pay | Admitting: Physician Assistant

## 2019-07-08 NOTE — Telephone Encounter (Signed)
So I don't a have a safe and effective  Medication to have you take for your appetite      I would like you to ask your psychiatrist if they think any of  Your medications could be adding to your problem and if  Any changes could help your fatigue and decrease appetite.  And weight loss.   Please  get back with me in what they may say . I would like  A follow up in  2 months  For weight check and how doing  Can be virtual if can give weight and VS

## 2019-07-09 ENCOUNTER — Ambulatory Visit (INDEPENDENT_AMBULATORY_CARE_PROVIDER_SITE_OTHER): Payer: Medicare HMO

## 2019-07-09 ENCOUNTER — Telehealth: Payer: Self-pay | Admitting: Physician Assistant

## 2019-07-09 ENCOUNTER — Encounter: Payer: Self-pay | Admitting: Rheumatology

## 2019-07-09 ENCOUNTER — Ambulatory Visit (INDEPENDENT_AMBULATORY_CARE_PROVIDER_SITE_OTHER): Payer: Medicare HMO | Admitting: Rheumatology

## 2019-07-09 ENCOUNTER — Other Ambulatory Visit: Payer: Self-pay

## 2019-07-09 ENCOUNTER — Telehealth: Payer: Self-pay | Admitting: Rheumatology

## 2019-07-09 VITALS — BP 121/70 | HR 105 | Resp 14 | Ht 62.5 in | Wt 121.0 lb

## 2019-07-09 DIAGNOSIS — R768 Other specified abnormal immunological findings in serum: Secondary | ICD-10-CM | POA: Diagnosis not present

## 2019-07-09 DIAGNOSIS — E782 Mixed hyperlipidemia: Secondary | ICD-10-CM

## 2019-07-09 DIAGNOSIS — R7689 Other specified abnormal immunological findings in serum: Secondary | ICD-10-CM

## 2019-07-09 DIAGNOSIS — M19071 Primary osteoarthritis, right ankle and foot: Secondary | ICD-10-CM | POA: Diagnosis not present

## 2019-07-09 DIAGNOSIS — Z23 Encounter for immunization: Secondary | ICD-10-CM

## 2019-07-09 DIAGNOSIS — Z8639 Personal history of other endocrine, nutritional and metabolic disease: Secondary | ICD-10-CM

## 2019-07-09 DIAGNOSIS — M7541 Impingement syndrome of right shoulder: Secondary | ICD-10-CM

## 2019-07-09 DIAGNOSIS — J479 Bronchiectasis, uncomplicated: Secondary | ICD-10-CM

## 2019-07-09 DIAGNOSIS — M79642 Pain in left hand: Secondary | ICD-10-CM | POA: Diagnosis not present

## 2019-07-09 DIAGNOSIS — I10 Essential (primary) hypertension: Secondary | ICD-10-CM

## 2019-07-09 DIAGNOSIS — Z87891 Personal history of nicotine dependence: Secondary | ICD-10-CM

## 2019-07-09 DIAGNOSIS — Z79899 Other long term (current) drug therapy: Secondary | ICD-10-CM

## 2019-07-09 DIAGNOSIS — M79641 Pain in right hand: Secondary | ICD-10-CM | POA: Diagnosis not present

## 2019-07-09 DIAGNOSIS — M51369 Other intervertebral disc degeneration, lumbar region without mention of lumbar back pain or lower extremity pain: Secondary | ICD-10-CM

## 2019-07-09 DIAGNOSIS — M19041 Primary osteoarthritis, right hand: Secondary | ICD-10-CM | POA: Diagnosis not present

## 2019-07-09 DIAGNOSIS — M5136 Other intervertebral disc degeneration, lumbar region: Secondary | ICD-10-CM

## 2019-07-09 DIAGNOSIS — I7301 Raynaud's syndrome with gangrene: Secondary | ICD-10-CM | POA: Diagnosis not present

## 2019-07-09 DIAGNOSIS — M170 Bilateral primary osteoarthritis of knee: Secondary | ICD-10-CM

## 2019-07-09 DIAGNOSIS — M791 Myalgia, unspecified site: Secondary | ICD-10-CM | POA: Diagnosis not present

## 2019-07-09 DIAGNOSIS — M5030 Other cervical disc degeneration, unspecified cervical region: Secondary | ICD-10-CM

## 2019-07-09 DIAGNOSIS — Z8659 Personal history of other mental and behavioral disorders: Secondary | ICD-10-CM

## 2019-07-09 DIAGNOSIS — M19072 Primary osteoarthritis, left ankle and foot: Secondary | ICD-10-CM

## 2019-07-09 DIAGNOSIS — M19042 Primary osteoarthritis, left hand: Secondary | ICD-10-CM

## 2019-07-09 DIAGNOSIS — D721 Eosinophilia, unspecified: Secondary | ICD-10-CM

## 2019-07-09 DIAGNOSIS — R296 Repeated falls: Secondary | ICD-10-CM

## 2019-07-09 NOTE — Telephone Encounter (Signed)
Desiree [name], please call her and let her know I don't think it's related to the meds she's on from me, esp if her sx started recently.  I see she has a lot of physical health probs too, so think it may be related to that.  I'll disc w/ her at next appt, and would like her to stay on the same meds for now. Thanks.

## 2019-07-09 NOTE — Telephone Encounter (Signed)
Pt has been notified and states she will make appt when she gets her flu shots

## 2019-07-09 NOTE — Telephone Encounter (Signed)
Pt called and left a voice mail that she is experiencing low energy and is loosing a lot of weight. She wants to know if it could be the medication she is on.

## 2019-07-09 NOTE — Telephone Encounter (Signed)
Patient left a voicemail stating "her fingers stay cold and numb most of the time and they are turning dark."  Patient states she has been using the cream, but is requesting a return call to let her know if there is something else she can take.
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
## 2019-07-09 NOTE — Telephone Encounter (Signed)
Attempted to contact the patient and left message for patient to call the office.  

## 2019-07-09 NOTE — Telephone Encounter (Signed)
Patient scheduled for an evaluation 07/09/19 at 2:30 pm.

## 2019-07-10 ENCOUNTER — Encounter: Payer: Self-pay | Admitting: Internal Medicine

## 2019-07-10 ENCOUNTER — Ambulatory Visit: Payer: Medicare HMO | Admitting: Podiatry

## 2019-07-10 NOTE — Telephone Encounter (Signed)
Left voicemail to call back with information 

## 2019-07-16 ENCOUNTER — Telehealth: Payer: Self-pay | Admitting: Rheumatology

## 2019-07-16 LAB — CK: Total CK: 55 U/L (ref 29–143)

## 2019-07-16 LAB — SEDIMENTATION RATE: Sed Rate: 122 mm/h — ABNORMAL HIGH (ref 0–30)

## 2019-07-16 LAB — 14-3-3 ETA PROTEIN: 14-3-3 eta Protein: <0.2 ng/mL (ref <0.2)

## 2019-07-16 LAB — CYCLIC CITRUL PEPTIDE ANTIBODY, IGG: Cyclic Citrullin Peptide Ab: <16 UNITS

## 2019-07-16 NOTE — Progress Notes (Signed)
Anti-CCP, 14-3-3 negative, CK WNL.   Sed rate is chronically elevated-122. Discussed with Dr. [name]. Please notify patient and forward results to PCP and GI specialist. According to Dr. [name] she continues to have persistent abdominal pain.

## 2019-07-16 NOTE — Telephone Encounter (Signed)
Patient left a voicemail requesting a return call with labwork results.

## 2019-07-16 NOTE — Telephone Encounter (Signed)
Contacted patient and reviewed lab results. See lab note.

## 2019-07-21 DIAGNOSIS — K219 Gastro-esophageal reflux disease without esophagitis: Secondary | ICD-10-CM | POA: Diagnosis not present

## 2019-07-21 DIAGNOSIS — I129 Hypertensive chronic kidney disease with stage 1 through stage 4 chronic kidney disease, or unspecified chronic kidney disease: Secondary | ICD-10-CM | POA: Diagnosis not present

## 2019-07-21 DIAGNOSIS — N183 Chronic kidney disease, stage 3 (moderate): Secondary | ICD-10-CM | POA: Diagnosis not present

## 2019-07-21 DIAGNOSIS — D631 Anemia in chronic kidney disease: Secondary | ICD-10-CM | POA: Diagnosis not present

## 2019-07-21 DIAGNOSIS — N2581 Secondary hyperparathyroidism of renal origin: Secondary | ICD-10-CM | POA: Diagnosis not present

## 2019-07-21 DIAGNOSIS — M1990 Unspecified osteoarthritis, unspecified site: Secondary | ICD-10-CM | POA: Diagnosis not present

## 2019-07-21 DIAGNOSIS — M549 Dorsalgia, unspecified: Secondary | ICD-10-CM | POA: Diagnosis not present

## 2019-07-21 DIAGNOSIS — G8929 Other chronic pain: Secondary | ICD-10-CM | POA: Diagnosis not present

## 2019-07-21 DIAGNOSIS — E1122 Type 2 diabetes mellitus with diabetic chronic kidney disease: Secondary | ICD-10-CM | POA: Diagnosis not present

## 2019-07-24 DIAGNOSIS — R1013 Epigastric pain: Secondary | ICD-10-CM | POA: Diagnosis not present

## 2019-07-24 DIAGNOSIS — K219 Gastro-esophageal reflux disease without esophagitis: Secondary | ICD-10-CM | POA: Diagnosis not present

## 2019-07-24 DIAGNOSIS — R194 Change in bowel habit: Secondary | ICD-10-CM | POA: Diagnosis not present

## 2019-07-29 ENCOUNTER — Other Ambulatory Visit: Payer: Self-pay | Admitting: Gastroenterology

## 2019-07-29 ENCOUNTER — Ambulatory Visit: Payer: Self-pay | Admitting: Physician Assistant

## 2019-07-29 DIAGNOSIS — R1011 Right upper quadrant pain: Secondary | ICD-10-CM

## 2019-07-31 ENCOUNTER — Ambulatory Visit: Payer: Medicare HMO | Admitting: Podiatry

## 2019-07-31 ENCOUNTER — Other Ambulatory Visit: Payer: Self-pay

## 2019-07-31 DIAGNOSIS — B351 Tinea unguium: Secondary | ICD-10-CM | POA: Diagnosis not present

## 2019-07-31 DIAGNOSIS — E1169 Type 2 diabetes mellitus with other specified complication: Secondary | ICD-10-CM

## 2019-07-31 DIAGNOSIS — E1151 Type 2 diabetes mellitus with diabetic peripheral angiopathy without gangrene: Secondary | ICD-10-CM

## 2019-07-31 DIAGNOSIS — L84 Corns and callosities: Secondary | ICD-10-CM | POA: Diagnosis not present

## 2019-08-03 ENCOUNTER — Other Ambulatory Visit: Payer: Self-pay | Admitting: Pulmonary Disease

## 2019-08-03 ENCOUNTER — Other Ambulatory Visit: Payer: Self-pay | Admitting: Physician Assistant

## 2019-08-03 ENCOUNTER — Other Ambulatory Visit: Payer: Self-pay | Admitting: Internal Medicine

## 2019-08-05 ENCOUNTER — Other Ambulatory Visit: Payer: Medicare HMO

## 2019-08-06 ENCOUNTER — Telehealth: Payer: Self-pay | Admitting: Internal Medicine

## 2019-08-06 ENCOUNTER — Telehealth: Payer: Self-pay | Admitting: Pulmonary Disease

## 2019-08-06 ENCOUNTER — Ambulatory Visit
Admission: RE | Admit: 2019-08-06 | Discharge: 2019-08-06 | Disposition: A | Discharge status: Home / Self Care | Payer: Medicare HMO | Source: Ambulatory Visit | Attending: Gastroenterology | Admitting: Gastroenterology

## 2019-08-06 DIAGNOSIS — K828 Other specified diseases of gallbladder: Secondary | ICD-10-CM | POA: Diagnosis not present

## 2019-08-06 DIAGNOSIS — R1011 Right upper quadrant pain: Secondary | ICD-10-CM

## 2019-08-06 NOTE — Telephone Encounter (Signed)
Patient is due for a follow up with our office.  Likely she was prescribed Singulair because of her severe allergy markers on previous bloodwork. As well as her chronic cough. For right now the patient would like to not take it then that is okay.This can be discussed today for their office visit.  Patient needs to establish with a new pulmonary provider in our office. This needs to be a 30 minute visit as she's a former Dr.  Lake Bells patient. Please establish the patient with either Dr. Tamala Julian, Dr. Loanne Drilling, or Dr. Carlis Abbott.  Please get the patient scheduled within the next 2 to 4 weeks as was requested of her in March/2020.  Wyn Quaker FNP

## 2019-08-06 NOTE — Telephone Encounter (Signed)
Please advise 

## 2019-08-06 NOTE — Telephone Encounter (Signed)
Called and spoke w/ pt. Pt is wondering if she should still be taking montelukast (Singulair) because she saw something on the news that Singulair causes cancer. I inquired if she had been on this medication for awhile, which she replied that BQ prescribed it for her in the past, but she is not currently taking it. Pt had a recent refill of Singulair on 08/03/2019, which prompted her inquiry.   I reviewed the medications Aaron Edelman instructed her to take from office visit 12/26/2018. There was no mention of Singulair specifically; however, Aaron Edelman did instruct her to "take her medications as prescribed." I let pt know I would notify Aaron Edelman of her concerns and call her back once we've obtained his response. Pt expressed understanding.   Aaron Edelman, please advise if patient should continue taking Singulair. Thank you.

## 2019-08-06 NOTE — Telephone Encounter (Signed)
Pt called and would like to know if she should be taking losartan (COZAAR) pt would like a call back from the nurse regarding.

## 2019-08-06 NOTE — Telephone Encounter (Signed)
Spoke with patient. She has been scheduled with Dr. Carlis Abbott for Nov 18th at 330. Will go ahead and cancel the appt she had on Nov 10th. She is aware of our location.   She is also aware that it is ok for her to discontinue the Singulair.   Nothing further needed at time of call.

## 2019-08-08 ENCOUNTER — Other Ambulatory Visit: Payer: Self-pay

## 2019-08-08 DIAGNOSIS — Z79899 Other long term (current) drug therapy: Secondary | ICD-10-CM

## 2019-08-08 DIAGNOSIS — E785 Hyperlipidemia, unspecified: Secondary | ICD-10-CM

## 2019-08-08 NOTE — Telephone Encounter (Signed)
   Copied from [place] [id number]. Topic: General - Other >> Aug 07, 2019  4:49 PM Pauline Good wrote: Reason for CRM: pt want to know if she is still suppose to be taking atorvastatin (LIPITOR) tablet 20 mg and if so she need a refill sent to [facility]    Pt has been notified that this was discontinued last visit with Dr. [name]

## 2019-08-08 NOTE — Telephone Encounter (Signed)
So  we stopped the losartan .because your blood pressure was too low .   Let us know if BP   is rising  140 and above   We took you off the atorvastatin  Thinking it could have been causing side effects.   I advise  Fasting labs  To decide    About this medication.  Please order fasting  Lipid panel  cbcdiff  Bmp and lfts  And then  Plan follow up.    Lab Results  Component Value Date   WBC 19.3 01/20/2019   HGB 10.7 (A) 01/20/2019   HCT 31 (A) 01/20/2019   PLT 373 01/20/2019   GLUCOSE 89 12/03/2018   CHOL 168 08/07/2017   TRIG 74.0 08/07/2017   HDL 64.50 08/07/2017   LDLDIRECT 146.3 01/26/2010   LDLCALC 89 08/07/2017   ALT 20 12/03/2018   AST 54 (H) 12/03/2018   NA 133 (A) 01/20/2019   K 4.1 01/20/2019   CL 97 12/03/2018   CREATININE 1.3 (A) 01/20/2019   BUN 48 (H) 12/03/2018   CO2 22 12/03/2018   TSH 2.153 10/10/2018   INR 1.16 10/09/2018   HGBA1C 4.9 10/10/2018   MICROALBUR 3.1 (H) 12/31/2015

## 2019-08-08 NOTE — Telephone Encounter (Signed)
Pt had already been informed and states will wait fir fasting labs

## 2019-08-11 ENCOUNTER — Telehealth: Payer: Self-pay | Admitting: Internal Medicine

## 2019-08-11 MED ORDER — GLUCOSE BLOOD VI STRP: ORAL_STRIP | 12 refills | Status: DC

## 2019-08-11 NOTE — Telephone Encounter (Signed)
Refill has been sent in.  

## 2019-08-11 NOTE — Telephone Encounter (Signed)
Medication Refill - Medication:  Accu-check Test strips  Has the patient contacted their pharmacy? yes (Agent: If no, request that the patient contact the pharmacy for the refill.) (Agent: If yes, when and what did the pharmacy advise?)  Preferred Pharmacy (with phone number or street name):  [facility] [phone] - [place], [place] - [address] [phone] (Phone) [phone] (Fax)   Agent: Please be advised that RX refills may take up to 3 business days. We ask that you follow-up with your pharmacy.

## 2019-08-18 ENCOUNTER — Other Ambulatory Visit: Payer: Self-pay

## 2019-08-18 ENCOUNTER — Ambulatory Visit (INDEPENDENT_AMBULATORY_CARE_PROVIDER_SITE_OTHER): Payer: Medicare HMO | Admitting: Physician Assistant

## 2019-08-18 ENCOUNTER — Encounter: Payer: Self-pay | Admitting: Physician Assistant

## 2019-08-18 DIAGNOSIS — G4700 Insomnia, unspecified: Secondary | ICD-10-CM

## 2019-08-18 DIAGNOSIS — F329 Major depressive disorder, single episode, unspecified: Secondary | ICD-10-CM

## 2019-08-18 DIAGNOSIS — F32A Depression, unspecified: Secondary | ICD-10-CM

## 2019-08-18 NOTE — Progress Notes (Signed)
[facility]  [name],  MRN: [id number]  PCP: [name], MD  Date of Evaluation: 08/18/2019 Time spent:15 minutes  Chief Complaint:  Chief Complaint    Follow-up     Virtual Visit via Telephone Note  I connected with patient by a video enabled telemedicine application or telephone, with their informed consent, and verified patient privacy and that I am speaking with the correct person using two identifiers.  I am private, in my office and the patient is home.  I discussed the limitations, risks, security and privacy concerns of performing an evaluation and management service by telephone and the availability of in person appointments. I also discussed with the patient that there may be a patient responsible charge related to this service. The patient expressed understanding and agreed to proceed.   I discussed the assessment and treatment plan with the patient. The patient was provided an opportunity to ask questions and all were answered. The patient agreed with the plan and demonstrated an understanding of the instructions.   The patient was advised to call back or seek an in-person evaluation if the symptoms worsen or if the condition fails to improve as anticipated.  I provided 15 minutes of non-face-to-face time during this encounter.  HISTORY/CURRENT STATUS: HPI for routine med check.  Desiree [name] is a former patient of [facility] [place], [place] and is transferred to my care.  Patient denies loss of interest in usual activities and is able to enjoy things.  Denies decreased energy or motivation.  Appetite has not changed.  No extreme sadness, tearfulness, or feelings of hopelessness.  Denies any changes in concentration, making decisions or remembering things.  Denies suicidal or homicidal thoughts.  Patient denies increased energy with decreased need for sleep, no increased talkativeness, no racing thoughts, no impulsivity or risky behaviors, no increased  spending, no increased libido, no grandiosity.  Denies AH/VH.  Sleeps better than before.  The Trazodone has helped.  She does not have much of an appetite and states she is lost around 30 something pounds in the past year.  She is talking with her PCP about that.  Denies dizziness, syncope, seizures, numbness, tingling, tremor, tics, unsteady gait, slurred speech, confusion. Denies muscle or joint pain, stiffness, or dystonia.  Individual Medical History/ Review of Systems: Changes? :Yes See above and records on chart  Past medications for mental health diagnoses include: I do not have her paper chart in front of me and she does not remember everything she is tried.  Allergies: Prednisone, Solu-medrol [methylprednisolone], Amoxicillin, Codeine, Hydrocodone-acetaminophen, Penicillins, and Tape  Current Medications:  Current Outpatient Medications:  .  acetaminophen (TYLENOL) 500 MG tablet, Take 2 tablets (1,000 mg total) by mouth every 6 (six) hours as needed. (Patient taking differently: Take 1,000 mg by mouth every 6 (six) hours as needed for mild pain or headache. ), Disp: 30 tablet, Rfl: 0 .  aspirin EC 81 MG tablet, Take 81 mg by mouth daily. , Disp: , Rfl:  .  Blood Glucose Monitoring Suppl (ACCU-CHEK NANO SMARTVIEW) W/DEVICE KIT, Use to check blood sugar 1-2 times daily (Patient taking differently: 1 strip by Other route 2 (two) times a week. ), Disp: 1 kit, Rfl: 0 .  diclofenac sodium (VOLTAREN) 1 % GEL, APPLY 2-4 GRAMS TO AFFECTED JOINTS UP TO FOUR TIMES DAILY, Disp: 400 g, Rfl: 2 .  DULoxetine (CYMBALTA) 30 MG capsule, Take 3 capsules (90 mg total) by mouth every morning., Disp: 270 capsule, Rfl: 0 .  famotidine (PEPCID) 40 MG tablet, TK 1 T PO QD, Disp: , Rfl:  .  fluticasone (FLONASE) 50 MCG/ACT nasal spray, Place 1-2 sprays into both nostrils at bedtime. , Disp: , Rfl:  .  glucose blood test strip, Accu-chek nano, test glucose once daily, dx E11.9, Disp: 100 each, Rfl: 12 .   ipratropium-albuterol (DUONEB) 0.5-2.5 (3) MG/3ML SOLN, Take 3 mLs by nebulization 3 (three) times daily. (Patient taking differently: Take 3 mLs by nebulization 3 (three) times daily as needed (shortness of breath). ), Disp: 360 mL, Rfl: 11 .  lamoTRIgine (LAMICTAL) 25 MG tablet, TAKE 1 TABLET(25 MG) BY MOUTH TWICE DAILY, Disp: 180 tablet, Rfl: 0 .  Lancets Misc. (ACCU-CHEK FASTCLIX LANCET) KIT, USE TO CHECK BLOOD SUGAR 1-2 TIMES DAILY Dx E11.9, Disp: 1 kit, Rfl: 1 .  linaclotide (LINZESS) 145 MCG CAPS capsule, Take 145 mcg by mouth daily as needed (constipation)., Disp: , Rfl:  .  montelukast (SINGULAIR) 10 MG tablet, TAKE 1 TABLET(10 MG) BY MOUTH AT BEDTIME, Disp: 90 tablet, Rfl: 0 .  Multiple Vitamins-Minerals (CENTRUM SILVER 50+WOMEN) TABS, Take 1 tablet by mouth daily., Disp: , Rfl:  .  ondansetron (ZOFRAN-ODT) 4 MG disintegrating tablet, DISSOLVE 1 TABLET(4 MG) ON THE TONGUE EVERY 8 HOURS AS NEEDED FOR NAUSEA OR VOMITING, Disp: 20 tablet, Rfl: 0 .  Probiotic Product (ALIGN) 4 MG CAPS, Take 4 mg by mouth daily., Disp: , Rfl:  .  Respiratory Therapy Supplies (FLUTTER) DEVI, 1 Device by Does not apply route as directed. (Patient taking differently: 1 Device as directed. ), Disp: 1 each, Rfl: 0 .  sodium chloride HYPERTONIC 3 % nebulizer solution, USE 1 VIAL VIA NEBULIZER TWICE A DAY., Disp: 240 mL, Rfl: 0 .  traZODone (DESYREL) 50 MG tablet, TAKE 3 TO 4 TABLETS(150 TO 200 MG) BY MOUTH AT BEDTIME AS NEEDED, Disp: 90 tablet, Rfl: 2 .  triamcinolone cream (KENALOG) 0.1 %, Apply 1 application topically daily as needed (for itching of affected areas). , Disp: , Rfl: 0 .  umeclidinium-vilanterol (ANORO ELLIPTA) 62.5-25 MCG/INH AEPB, INHALE 1 PUFF ONCE DAILY (Patient taking differently: Inhale 1 puff into the lungs daily. INHALE 1 PUFF ONCE DAILY), Disp: 60 each, Rfl: 5 .  XIIDRA 5 % SOLN, , Disp: , Rfl:  .  cetirizine (ZYRTEC) 10 MG tablet, Take 10 mg by mouth at bedtime. , Disp: , Rfl:  .  feeding  supplement, ENSURE ENLIVE, (ENSURE ENLIVE) LIQD, Take 237 mLs by mouth 3 (three) times daily between meals. (Patient taking differently: Take 237 mLs by mouth 2 (two) times daily after a meal. ), Disp: 14 Bottle, Rfl: 0 .  losartan (COZAAR) 50 MG tablet, , Disp: , Rfl:  .  nitroGLYCERIN (NITRO-BID) 2 % ointment, APPLY 1/4 INCH TOPICALLY TO AFFECTED FINGERTIPS 3 TIMES DAILY (Patient not taking: Reported on 08/18/2019), Disp: 30 g, Rfl: 0 .  NON FORMULARY, Respivest: Three times a day for 30 minutes, Disp: , Rfl:  .  pantoprazole (PROTONIX) 40 MG tablet, Take 40 mg by mouth daily., Disp: , Rfl:  Medication Side Effects: none  Family Medical/ Social History: Changes? No   MENTAL HEALTH EXAM:  There were no vitals taken for this visit.There is no height or weight on file to calculate BMI.  General Appearance: Unable to assess  Eye Contact:  Unable to assess  Speech:  Clear and Coherent  Volume:  Normal  Mood:  Euthymic  Affect:  Unable to assess  Thought Process:  Goal Directed and Descriptions of  Associations: Intact  Orientation:  Full (Time, Place, and Person)  Thought Content: Logical   Suicidal Thoughts:  No  Homicidal Thoughts:  No  Memory:  WNL  Judgement:  Good  Insight:  Good  Psychomotor Activity:  Unable to assess  Concentration:  Concentration: Good  Recall:  Good  Fund of Knowledge: Good  Language: Good  Assets:  Desire for Improvement  ADL's:  Intact  Cognition: WNL  Prognosis:  Good    DIAGNOSES:    ICD-10-CM   1. Depression, unspecified depression type  F32.9   2. Insomnia, unspecified type  G47.00     Receiving Psychotherapy: No    RECOMMENDATIONS:  We discussed sleep hygiene, avoid electronics within 2 hours of the time she wants to go to sleep.  No caffeine after 2 PM. Continue trazodone 50 mg, 1-2 nightly as needed sleep. Continue Cymbalta 90 mg every morning. Continue Lamictal 25 mg 1 p.o. twice daily. Return in 6 months.  Donnal Moat,  PA-C

## 2019-08-19 ENCOUNTER — Ambulatory Visit: Payer: Medicare HMO | Admitting: Rheumatology

## 2019-08-22 ENCOUNTER — Telehealth: Payer: Self-pay

## 2019-08-22 NOTE — Telephone Encounter (Signed)
I agree with plan  She had had low bp in the past when we stopped the losartan but could reinstitute if needed

## 2019-08-22 NOTE — Telephone Encounter (Signed)
Pt states her blood pressure was high yesterday but she doesn't remember what it was. Pt states she will take her blood pressure twice daily for the next 2 days and call back on Monday with her blood pressure readings. Pt states she is not having any symptoms no swelling, dizziness , blurred vision or headaches. Informed pt we do not currently have any samples of ensure.

## 2019-08-22 NOTE — Telephone Encounter (Signed)
Copied from [place] [id number]. Topic: General - Other >> Aug 22, 2019  3:50 PM Rainey Pines A wrote: Patient wants to know if its okay for her to take her losartan and wants to know if the office has any samples of ensure that she can pickup. Please advise.

## 2019-08-23 ENCOUNTER — Encounter (INDEPENDENT_AMBULATORY_CARE_PROVIDER_SITE_OTHER): Payer: Self-pay

## 2019-08-25 ENCOUNTER — Telehealth: Payer: Self-pay | Admitting: Internal Medicine

## 2019-08-25 MED ORDER — LOSARTAN POTASSIUM 25 MG PO TABS: 25.0000 mg | ORAL_TABLET | Freq: Every day | ORAL | 0 refills | Status: DC

## 2019-08-25 NOTE — Telephone Encounter (Signed)
Pt called to let [place] know her BP's 10/31 left arm 173/85  Right 153/79  11/1/ left arm 143/63    Right 146/85

## 2019-08-25 NOTE — Telephone Encounter (Signed)
Please advise 

## 2019-08-25 NOTE — Telephone Encounter (Signed)
Pt notified of blood pressure medication being sent in and has understanding to document readings pt states that her weight this morning was 123 but she is not gaining like she wants to

## 2019-08-25 NOTE — Telephone Encounter (Signed)
So since her Bp is now elevated  . we can restart her losartan  25 mg per day  Can send in  90 days.    Have her send in readings   In 2-3 weeks   .   Please document what her weight is doing .

## 2019-08-26 ENCOUNTER — Other Ambulatory Visit: Payer: Self-pay | Admitting: Rheumatology

## 2019-08-26 DIAGNOSIS — H524 Presbyopia: Secondary | ICD-10-CM | POA: Diagnosis not present

## 2019-08-26 DIAGNOSIS — H52223 Regular astigmatism, bilateral: Secondary | ICD-10-CM | POA: Diagnosis not present

## 2019-08-26 NOTE — Telephone Encounter (Signed)
Last Visit: 07/09/19 Next Visit: 10/14/19  Okay to refill per Dr. [name]

## 2019-08-27 ENCOUNTER — Ambulatory Visit (INDEPENDENT_AMBULATORY_CARE_PROVIDER_SITE_OTHER): Payer: Medicare HMO | Admitting: Otolaryngology

## 2019-08-28 DIAGNOSIS — D6859 Other primary thrombophilia: Secondary | ICD-10-CM | POA: Diagnosis not present

## 2019-08-28 DIAGNOSIS — D7219 Other eosinophilia: Secondary | ICD-10-CM | POA: Diagnosis not present

## 2019-08-29 ENCOUNTER — Telehealth: Payer: Self-pay

## 2019-08-29 DIAGNOSIS — J3489 Other specified disorders of nose and nasal sinuses: Secondary | ICD-10-CM

## 2019-08-29 NOTE — Telephone Encounter (Signed)
Copied from [place] [phone]. Topic: Referral - Request for Referral >> Aug 29, 2019 12:05 PM Sheran Luz wrote: Has patient seen PCP for this complaint? Yes *If NO, is insurance requiring patient see PCP for this issue before PCP can refer them? Referral for which specialty: ENT Preferred provider/office: Dr. Lucia Gaskins Reason for referral: ENT- out of network

## 2019-08-29 NOTE — Telephone Encounter (Signed)
Desiree [name] sent the message to Dr. [name] to get the approval for the referral order for ENT.   The patient made the appointment with ENT before the referral was even placed.   Please advise

## 2019-09-01 ENCOUNTER — Ambulatory Visit (INDEPENDENT_AMBULATORY_CARE_PROVIDER_SITE_OTHER): Payer: Medicare HMO | Admitting: Otolaryngology

## 2019-09-02 ENCOUNTER — Ambulatory Visit: Payer: Medicare HMO | Admitting: Pulmonary Disease

## 2019-09-03 ENCOUNTER — Other Ambulatory Visit: Payer: Self-pay

## 2019-09-03 NOTE — Telephone Encounter (Signed)
Madison find out what I am supposed to do with this message .  thanks

## 2019-09-03 NOTE — Patient Outreach (Signed)
[facility]) Care Management  09/03/2019  NIXIE LAUBE [birth year] [id number]   Telephone call to patient for disease management follow up.  No answer. HIPAA compliant voice message left.  Plan: RN CM will attempt patient again in the month of December and send letter.  Jone Baseman, RN, MSN [place] Management Care Management Coordinator Direct Line [phone] Cell [phone] Toll Free: [phone]  Fax: [phone]

## 2019-09-04 NOTE — Telephone Encounter (Signed)
Please document diagnosis and approve  referral ( I dont see any  dx tanywhere in all of these messages)

## 2019-09-04 NOTE — Telephone Encounter (Signed)
Approval to place referral is all that is needed please advise

## 2019-09-05 NOTE — Addendum Note (Signed)
Addended by: Modena Morrow R on: 09/05/2019 09:54 AM   Modules accepted: Orders

## 2019-09-05 NOTE — Telephone Encounter (Signed)
Orders have been placed.

## 2019-09-05 NOTE — Telephone Encounter (Signed)
Pt requesting to go ent because she is having nose bleeds and has seen them in the past

## 2019-09-08 ENCOUNTER — Ambulatory Visit: Payer: Medicare HMO

## 2019-09-08 ENCOUNTER — Ambulatory Visit: Payer: Medicare HMO | Admitting: *Deleted

## 2019-09-09 ENCOUNTER — Telehealth: Payer: Self-pay

## 2019-09-09 ENCOUNTER — Other Ambulatory Visit: Payer: Self-pay | Admitting: Physician Assistant

## 2019-09-09 NOTE — Telephone Encounter (Signed)
Spoke with patient and told patient to call insurance to see what ENT is in network pt has understanding

## 2019-09-09 NOTE — Telephone Encounter (Signed)
Copied from [place] [phone]. Topic: Referral - Status >> Sep 09, 2019 11:26 AM Alanda Slim E wrote: Reason for CRM: Dr. Jennell Corner office called and they are not in the Pts [employer] network and the Pt will need a new referral to another office/ please advise

## 2019-09-10 ENCOUNTER — Encounter: Payer: Self-pay | Admitting: Critical Care Medicine

## 2019-09-10 ENCOUNTER — Ambulatory Visit (INDEPENDENT_AMBULATORY_CARE_PROVIDER_SITE_OTHER): Payer: Medicare HMO | Admitting: Critical Care Medicine

## 2019-09-10 ENCOUNTER — Other Ambulatory Visit: Payer: Self-pay

## 2019-09-10 VITALS — BP 106/60 | HR 95 | Ht 62.5 in | Wt 124.4 lb

## 2019-09-10 DIAGNOSIS — R634 Abnormal weight loss: Secondary | ICD-10-CM | POA: Diagnosis not present

## 2019-09-10 DIAGNOSIS — J449 Chronic obstructive pulmonary disease, unspecified: Secondary | ICD-10-CM | POA: Diagnosis not present

## 2019-09-10 DIAGNOSIS — J479 Bronchiectasis, uncomplicated: Secondary | ICD-10-CM

## 2019-09-10 MED ORDER — SODIUM CHLORIDE 3 % IN NEBU: INHALATION_SOLUTION | Freq: Two times a day (BID) | RESPIRATORY_TRACT | 12 refills | Status: DC

## 2019-09-10 MED ORDER — BEVESPI AEROSPHERE 9-4.8 MCG/ACT IN AERO: 2.0000 | INHALATION_SPRAY | Freq: Two times a day (BID) | RESPIRATORY_TRACT | 11 refills | Status: DC
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
## 2019-09-10 NOTE — Patient Instructions (Signed)
Thank you for visiting Dr. Carlis Abbott at [facility]. We recommend the following: Orders Placed This Encounter  Procedures  . Fungus Culture & Smear  . MYCOBACTERIA, CULTURE, WITH FLUOROCHROME SMEAR  . Respiratory or Resp and Sputum Culture   Orders Placed This Encounter  Procedures  . Fungus Culture & Smear    Standing Status:   Future    Standing Expiration Date:   09/09/2020  . MYCOBACTERIA, CULTURE, WITH FLUOROCHROME SMEAR    Standing Status:   Future    Standing Expiration Date:   03/09/2021  . Respiratory or Resp and Sputum Culture    Standing Status:   Future    Standing Expiration Date:   03/09/2021    Meds ordered this encounter  Medications  . Glycopyrrolate-Formoterol (BEVESPI AEROSPHERE) 9-4.8 MCG/ACT AERO    Sig: Inhale 2 puffs into the lungs 2 (two) times daily.    Dispense:  10.7 g    Refill:  11  . sodium chloride HYPERTONIC 3 % nebulizer solution    Sig: Take by nebulization 2 (two) times daily.    Dispense:  750 mL    Refill:  12    Return in about 3 months (around 12/11/2019).   If you can get Bevespi for less than Anoro, you can use Bevespi and stop Anoro.  Use ipratropium nebulizers as needed.  Use saline nebulizer two times per day every day with your vest.    Please do your part to reduce the spread of COVID-19.

## 2019-09-15 IMAGING — CR DG LUMBAR SPINE COMPLETE 4+V
5 series · 5 of 5 positions shown · non-contrast
Comparison: CT 08/06/2018

CLINICAL DATA: Fall with back pain

EXAM:
LUMBAR SPINE - COMPLETE 4+ VIEW

[t lumbar spine ap]
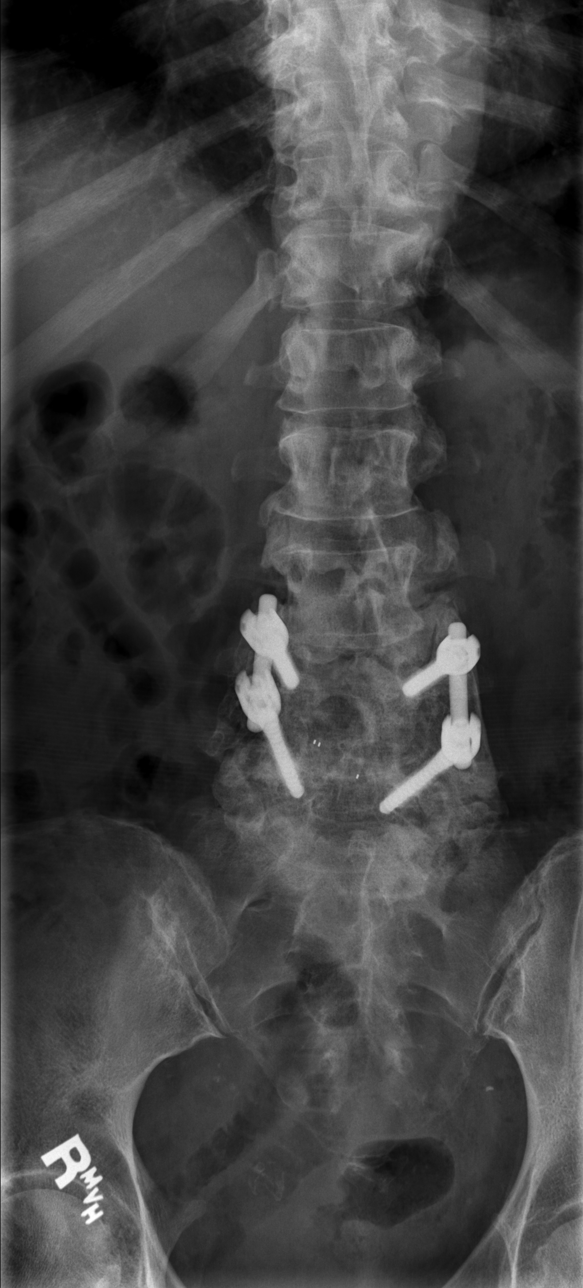

[t lumbar spine obl (1 of 2)]
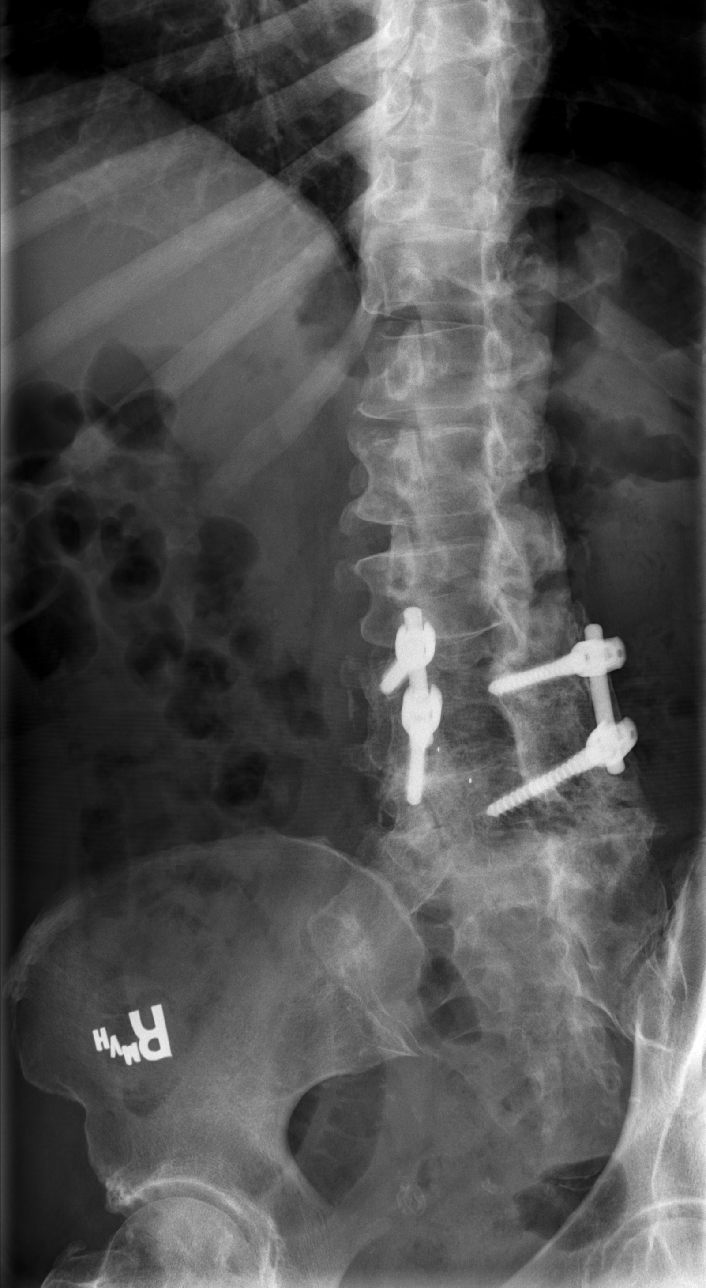

[t lumbar spine obl (2 of 2)]
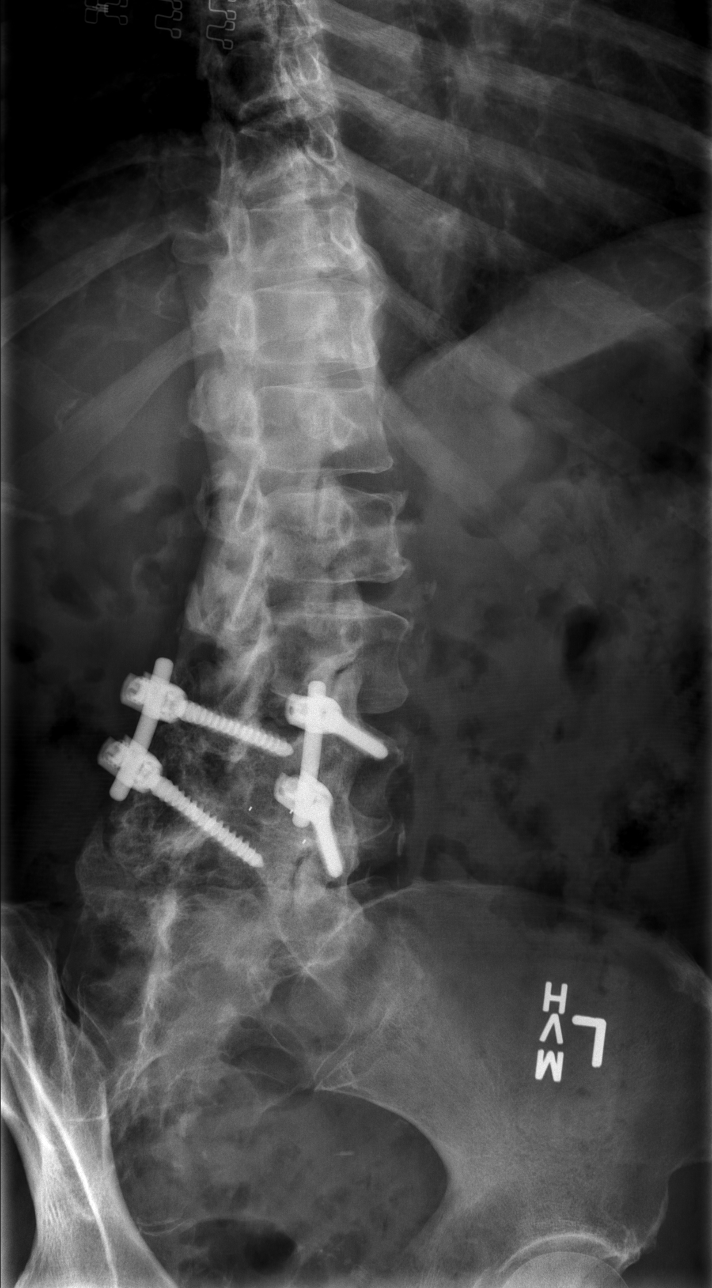

[t lumbar spine lat]
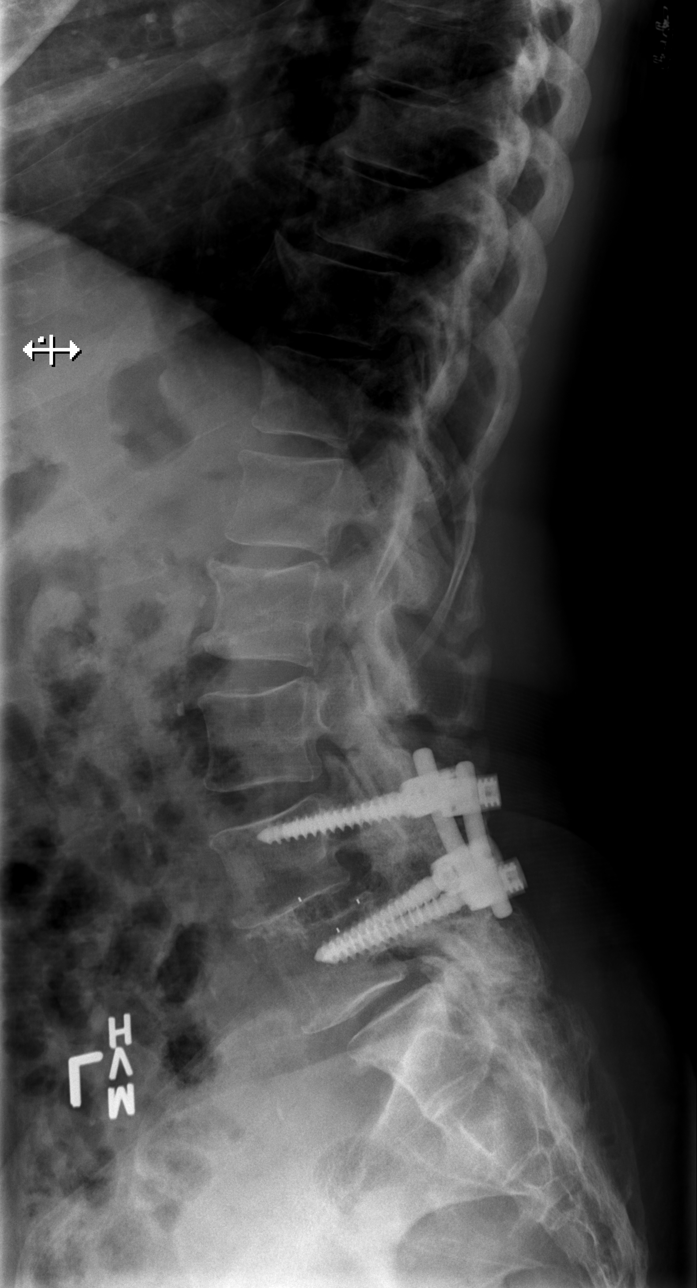

[t lumbar l-5 s-1 spot]
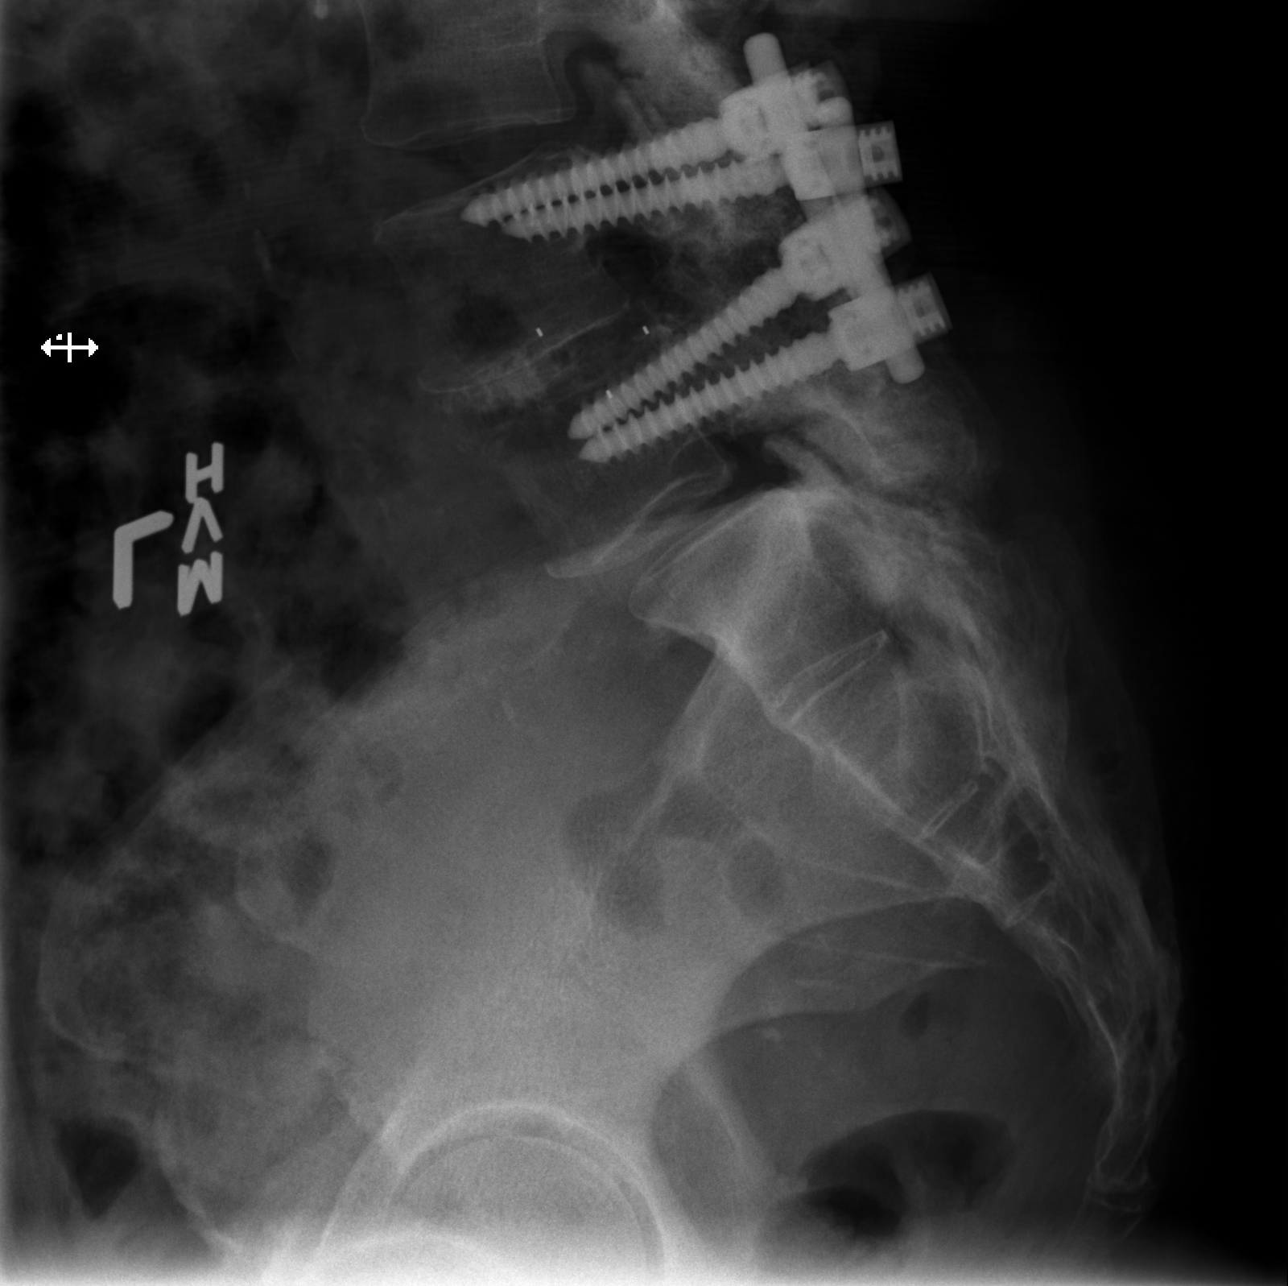

[5 of 5 positions shown; findings below may reference images not displayed]

FINDINGS: Posterior rods and fixating screws with interbody device at L4-L5.
Vertebral body heights are maintained. Mild degenerative change
L1-L2. Mild to moderate degenerative change L5-S1.
IMPRESSION: Postsurgical changes at L4-L5.  No acute osseous abnormality.

## 2019-09-22 ENCOUNTER — Telehealth: Payer: Self-pay | Admitting: Critical Care Medicine

## 2019-09-22 NOTE — Telephone Encounter (Signed)
FYI

## 2019-09-23 NOTE — Telephone Encounter (Signed)
Okay, that is usually a good sign. Thanks for letting me know.  LPC

## 2019-10-06 ENCOUNTER — Other Ambulatory Visit: Payer: Self-pay

## 2019-10-06 NOTE — Patient Outreach (Signed)
[facility]) Care Management  10/06/2019  Desiree [name] May 06, 1951 [id number]   Telephone call to patient for disease management follow up.  No answer. Unable to leave a message.  Plan: RN CM will attempt patient in the month of January.    Jone Baseman, RN, MSN [facility] Care Management Coordinator Direct Line [phone] Cell [phone] Toll Free: [phone]  Fax: [phone]

## 2019-10-08 ENCOUNTER — Telehealth: Payer: Self-pay

## 2019-10-08 NOTE — Telephone Encounter (Signed)
Copied from [facility] [id number]. Topic: Quick Communication - See Telephone Encounter >> Oct 07, 2019  7:01 PM Loma Boston wrote: CRM for notification. See Telephone encounter for: 10/07/19. Pt wants to know if it is okay that she is taking 500 mg tylenol extra strength every 4 hrs. Equals about 3 times a day. Even that is not helping her pain Back pain arthritis.   Pls have some Fu with her. [phone] [id number]

## 2019-10-08 NOTE — Telephone Encounter (Signed)
Tried to call pt back twice was unabe to leave voicemail crm created okay to disclose

## 2019-10-08 NOTE — Telephone Encounter (Signed)
Tell her she can safely take up to a total of 2000 mg of Tylenol a day. In other words she can take 4 of the 500 mg tablets a day

## 2019-10-08 NOTE — Telephone Encounter (Signed)
Please advise in absence of Dr.[name] 

## 2019-10-09 NOTE — Progress Notes (Signed)
Virtual Visit via Telephone Note  I connected with Desiree [name] on 10/10/19 at 12:00 PM EST by telephone and verified that I am speaking with the correct person using two identifiers.  Location: Patient: Home  Provider: Clinic  This service was conducted via virtual visit.   The patient was located at home. I was located in my office.  Consent was obtained prior to the virtual visit and is aware of possible charges through their insurance for this visit.  The patient is an established patient.  Dr. [name], MD conducted the virtual visit and Hazel Sams, PA-C acted as scribe during the service.  Office staff helped with scheduling follow up visits after the service was conducted.     I discussed the limitations, risks, security and privacy concerns of performing an evaluation and management service by telephone and the availability of in person appointments. I also discussed with the patient that there may be a patient responsible charge related to this service. The patient expressed understanding and agreed to proceed.  CC: Raynaud's  History of Present Illness: Patient is a 68 year old female with a past medical history of Raynaud's, osteoarthritis, and DDD.  She states she has noticed that the fingertip of the right index finger is tender and slightly blue.  She has noticed peeling of the skin and has been applying neosporin and nitroglycerin ointment. She continues to take aspirin 81 mg po daily. She is no longer taking any anti-hypertensive medications due to hypotension and frequent falls. She wears gloves on a daily basis.  She has been experiencing increased lower back pain. She is not having any neck pain at this time. She is not having any other joint pain or joint swelling.    Review of Systems  Constitutional: Negative for fever and malaise/fatigue.  Eyes: Negative for photophobia, pain, discharge and redness.  Respiratory: Negative for cough, shortness of breath and wheezing.    Cardiovascular: Negative for chest pain and palpitations.  Gastrointestinal: Negative for blood in stool, constipation and diarrhea.  Genitourinary: Negative for dysuria.  Musculoskeletal: Positive for back pain. Negative for joint pain, myalgias and neck pain.  Skin: Negative for rash.       +Raynaud's   Neurological: Negative for dizziness and headaches.  Psychiatric/Behavioral: Negative for depression. The patient is not nervous/anxious and does not have insomnia.       Observations/Objective: Physical Exam  Constitutional: She is oriented to person, place, and time.  Neurological: She is alert and oriented to person, place, and time.  Psychiatric: Mood, memory, affect and judgment normal.   Patient reports morning stiffness for 5 minutes.   Patient denies nocturnal pain.  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  She has noticed some peeling of the skin and has been using neosporin topically and applying nitroglycerin ointment prn.  She wears gloves on a daily basis.  She cannot tolerate taking amlodipine due to hypotension and frequent falls.  She discontinued PLQ in the past due to experiencing the side effect of diarrhea.  She is taking aspirin 81 mg 1 tablet daily.  We discussed the importance of avoiding triggers and drinking warm liquids on a regular basis.  She was  advised to schedule an appointment with the vascular surgeon if her symptoms worsen. She was advised to notify us if she develops any new or worsening symptoms.  She will follow up in 6-8 weeks.   High risk medication use: She discontinued  Plaquenil in the past due to experiencing worsening diarrhea.  She is taking aspirin 81 mg by mouth daily and using nitroglycerin ointment topically at bedtime.  Rheumatoid factor positive -RF 127 on 09/25/2017.  Sed rate was 129 and 12/03/2018. She is not having any joint pain or inflammation at this time.   Muscle tenderness -She is not having any muscle aches or tenderness at this time. She continues to have some muscle deconditioning.    Pain in both hands -Resolved   Primary osteoarthritis of both hands: She is not having any joint pain or inflammation currently.   Primary osteoarthritis of both knees: She denies any knee joint pain or inflammation.    Primary osteoarthritis of both feet: No feet pain or inflammation.   DDD (degenerative disc disease), cervical: She is not having any neck pain or stiffness at this time.   DDD (degenerative disc disease), lumbar: She has been experiencing increased lower back pain and stiffness.  She has no symptoms of radiculopathy at this time.    Impingement syndrome of right shoulder: She has no discomfort at this time.   Other medical conditions are listed as follows:  Bronchiectasis without complication (HCC)  Eosinophilia  Essential hypertension  History of diabetes mellitus  Mixed hyperlipidemia  History of bipolar disorder  Former smoker  Frequent falls    Follow Up Instructions: She will follow up in 6-8 weeks.    I discussed the assessment and treatment plan with the patient. The patient was provided an opportunity to ask questions and all were answered. The patient agreed with the plan and demonstrated an understanding of the instructions.   The patient was advised to call back or seek an in-person evaluation if the symptoms worsen or if the condition fails to improve as anticipated.  I provided 15 minutes of non-face-to-face time during this encounter.   [name], MD   Scribed  by-  Sherron Ales, PA-C

## 2019-10-10 ENCOUNTER — Other Ambulatory Visit: Payer: Self-pay

## 2019-10-10 ENCOUNTER — Telehealth (INDEPENDENT_AMBULATORY_CARE_PROVIDER_SITE_OTHER): Payer: Medicare HMO | Admitting: Rheumatology

## 2019-10-10 ENCOUNTER — Encounter: Payer: Self-pay | Admitting: Rheumatology

## 2019-10-10 DIAGNOSIS — M7541 Impingement syndrome of right shoulder: Secondary | ICD-10-CM

## 2019-10-10 DIAGNOSIS — M19071 Primary osteoarthritis, right ankle and foot: Secondary | ICD-10-CM

## 2019-10-10 DIAGNOSIS — R768 Other specified abnormal immunological findings in serum: Secondary | ICD-10-CM | POA: Diagnosis not present

## 2019-10-10 DIAGNOSIS — I7301 Raynaud's syndrome with gangrene: Secondary | ICD-10-CM

## 2019-10-10 DIAGNOSIS — M5030 Other cervical disc degeneration, unspecified cervical region: Secondary | ICD-10-CM

## 2019-10-10 DIAGNOSIS — M170 Bilateral primary osteoarthritis of knee: Secondary | ICD-10-CM

## 2019-10-10 DIAGNOSIS — Z8639 Personal history of other endocrine, nutritional and metabolic disease: Secondary | ICD-10-CM

## 2019-10-10 DIAGNOSIS — I10 Essential (primary) hypertension: Secondary | ICD-10-CM

## 2019-10-10 DIAGNOSIS — J479 Bronchiectasis, uncomplicated: Secondary | ICD-10-CM

## 2019-10-10 DIAGNOSIS — Z87891 Personal history of nicotine dependence: Secondary | ICD-10-CM

## 2019-10-10 DIAGNOSIS — M19072 Primary osteoarthritis, left ankle and foot: Secondary | ICD-10-CM

## 2019-10-10 DIAGNOSIS — Z8659 Personal history of other mental and behavioral disorders: Secondary | ICD-10-CM

## 2019-10-10 DIAGNOSIS — Z79899 Other long term (current) drug therapy: Secondary | ICD-10-CM | POA: Diagnosis not present

## 2019-10-10 DIAGNOSIS — M5136 Other intervertebral disc degeneration, lumbar region: Secondary | ICD-10-CM

## 2019-10-10 DIAGNOSIS — M19041 Primary osteoarthritis, right hand: Secondary | ICD-10-CM

## 2019-10-10 DIAGNOSIS — M19042 Primary osteoarthritis, left hand: Secondary | ICD-10-CM

## 2019-10-10 DIAGNOSIS — E782 Mixed hyperlipidemia: Secondary | ICD-10-CM

## 2019-10-13 ENCOUNTER — Telehealth: Payer: Self-pay | Admitting: Critical Care Medicine

## 2019-10-13 NOTE — Telephone Encounter (Signed)
Spoke with pt, she is not able to cough up her sputum in the mornings. I told her that should could do it at another time in the day, she just has to make sure she brings it here within 4 hours after collection. Pt understood and nothing further is needed.

## 2019-10-14 ENCOUNTER — Ambulatory Visit: Payer: Medicare HMO | Admitting: Rheumatology

## 2019-10-20 ENCOUNTER — Telehealth: Payer: Self-pay

## 2019-10-20 MED ORDER — NITRO-BID 2 % TD OINT: TOPICAL_OINTMENT | TRANSDERMAL | 0 refills | Status: DC

## 2019-10-20 NOTE — Telephone Encounter (Signed)
Refill request received via fax from [facility] on [address] for nitro-bid ointment.   Last Visit:10/10/2019 telemedicine  Next Visit: message sent to the front desk to schedule.   Okay to refill per Dr. [name].

## 2019-10-22 ENCOUNTER — Other Ambulatory Visit: Payer: Self-pay | Admitting: Physician Assistant

## 2019-10-22 NOTE — Telephone Encounter (Signed)
I don't see where she's ever been on lamictal 25 mg tid? Has f/u April 2021

## 2019-10-26 ENCOUNTER — Other Ambulatory Visit: Payer: Self-pay | Admitting: Physician Assistant

## 2019-10-28 ENCOUNTER — Telehealth: Payer: Self-pay

## 2019-10-28 NOTE — Telephone Encounter (Signed)
Renewal prior authorization for duloxetine 30 mg take 3 capsules daily with [facility] Medicare effective 10/24/2019-10/22/2020 ZE#[id number],

## 2019-10-31 ENCOUNTER — Ambulatory Visit: Payer: Medicare HMO | Admitting: Podiatry

## 2019-11-04 ENCOUNTER — Telehealth: Payer: Self-pay | Admitting: Critical Care Medicine

## 2019-11-04 NOTE — Telephone Encounter (Signed)
Attempted to call pt but line went straight to VM. Left message for pt to return call. 

## 2019-11-05 ENCOUNTER — Telehealth: Payer: Self-pay

## 2019-11-05 NOTE — Telephone Encounter (Signed)
Please advise 

## 2019-11-05 NOTE — Telephone Encounter (Signed)
Advise  Check Bp twice a day for 7- 10 days  And record  Then make visit virtual is ok   To decid if treatment is needed

## 2019-11-05 NOTE — Telephone Encounter (Signed)
ATC pt, line went straight to her voicemail. LMTCB x2 for pt.

## 2019-11-05 NOTE — Telephone Encounter (Signed)
Copied from CRM [id number]. Topic: General - Inquiry >> Nov 05, 2019  2:02 PM Crist Infante wrote: Reason for CRM: pt wants Dr Fabian Sharp to know she has gained up to 130 lbs and her bp was 142/96.  She hopes she does not have to go bacl on bp med

## 2019-11-06 ENCOUNTER — Telehealth: Payer: Self-pay | Admitting: Critical Care Medicine

## 2019-11-06 NOTE — Telephone Encounter (Signed)
Pt has told information and will record BP

## 2019-11-06 NOTE — Telephone Encounter (Signed)
ATC pt, line went straight to her voicemail. LMTCB x3 for pt. We have attempted to contact pt several times with no success or call back from pt. Per triage protocol, message will be closed.

## 2019-11-06 NOTE — Telephone Encounter (Signed)
Called and spoke with pt letting her know that the anoro is only a once daily inhaler and she verbalized understanding.nothing further needed.

## 2019-11-07 ENCOUNTER — Other Ambulatory Visit: Payer: Self-pay | Admitting: *Deleted

## 2019-11-07 MED ORDER — NITRO-BID 2 % TD OINT: TOPICAL_OINTMENT | TRANSDERMAL | 0 refills | Status: DC

## 2019-11-07 NOTE — Telephone Encounter (Signed)
Refill request received via fas  Last Visit: 10/10/19 Next Visit: 12/05/19  Okay to refill per Dr. Corliss Skains

## 2019-11-12 ENCOUNTER — Other Ambulatory Visit: Payer: Self-pay

## 2019-11-12 NOTE — Patient Outreach (Signed)
[facility]) Care Management  11/12/2019  Desiree [name] 12-Feb-1951 [id number]   Telephone call to patient for disease management follow up. No answer.  HIPAA compliant voice message left.  Plan: RN CM will attempt patient again in the month of March.  Bary Leriche, RN, MSN [facility] Care Management Care Management Coordinator Direct Line [phone] Cell [phone] Toll Free: [phone]  Fax: [phone]

## 2019-11-13 ENCOUNTER — Telehealth: Payer: Self-pay | Admitting: Internal Medicine

## 2019-11-13 NOTE — Telephone Encounter (Signed)
Pt called in and wanted to know if she should be taking her Bp for 1 week or 2 weeks and if she should bring it in to the office.  Pt would like to have a call.

## 2019-11-14 ENCOUNTER — Ambulatory Visit: Payer: Medicare HMO | Admitting: Podiatry

## 2019-11-14 ENCOUNTER — Other Ambulatory Visit: Payer: Self-pay

## 2019-11-14 VITALS — Temp 97.3°F

## 2019-11-14 DIAGNOSIS — E1169 Type 2 diabetes mellitus with other specified complication: Secondary | ICD-10-CM | POA: Diagnosis not present

## 2019-11-14 DIAGNOSIS — B351 Tinea unguium: Secondary | ICD-10-CM | POA: Diagnosis not present

## 2019-11-14 DIAGNOSIS — E1151 Type 2 diabetes mellitus with diabetic peripheral angiopathy without gangrene: Secondary | ICD-10-CM

## 2019-11-14 NOTE — Telephone Encounter (Signed)
Left detailed message stating Dr.[name] wanted it recorded for 10 days twice daily and that patient could either call it in to [place] or bring it into office

## 2019-11-14 NOTE — Progress Notes (Signed)
  Subjective:  [name], female    DOB: 21-Oct-1951,  MRN: [id number]  Chief Complaint  Patient presents with  . Routine Foot Care    Nail trim. No new complaints. Previous A1C: "7" per pt.    69 y.o. female presents with the above complaint. History confirmed with patient.   Objective:  Physical Exam: warm, good capillary refill, nail exam onychomycosis of the toenails, no trophic changes or ulcerative lesions, normal DP and PT pulses, and absent protective sensation Left Foot: normal exam, no swelling, tenderness, instability; ligaments intact, full range of motion of all ankle/foot joints  Right Foot: normal exam, no swelling, tenderness, instability; ligaments intact, full range of motion of all ankle/foot joints   No images are attached to the encounter.  Assessment:   1. Onychomycosis of multiple toenails with type 2 diabetes mellitus and peripheral angiopathy (HCC)      Plan:  Patient was evaluated and treated and all questions answered.  Onychomycosis, Diabetes and DPN -Patient is diabetic with a qualifying condition for at risk foot care.  Procedure: Nail Debridement Rationale: Patient meets criteria for routine foot care due to DPN Type of Debridement: manual, sharp debridement. Instrumentation: Nail nipper, rotary burr. Number of Nails: 10  Return in about 3 months (around 02/12/2020) for Diabetic Foot Care.

## 2019-11-17 ENCOUNTER — Telehealth: Payer: Self-pay | Admitting: Internal Medicine

## 2019-11-17 ENCOUNTER — Ambulatory Visit: Payer: Medicare HMO | Attending: Internal Medicine

## 2019-11-17 ENCOUNTER — Telehealth: Payer: Self-pay | Admitting: Rheumatology

## 2019-11-17 DIAGNOSIS — Z23 Encounter for immunization: Secondary | ICD-10-CM

## 2019-11-17 NOTE — Telephone Encounter (Signed)
Patient left a voicemail stating she received a letter from [facility] stating her prescription of NitroGlycerin 2% ointment is not listed under their covered drugs and she will not be able to refill it.  Patient requested a return call.

## 2019-11-17 NOTE — Telephone Encounter (Signed)
Pt calling in to report her bp over a ten day period:  180/73 191/97  151/75 157/75  186/65 168/99  195/70 127/53  173/65 150/67  174/61 115/60  200/76 194/83  168/81 155/58  155/63 170/75  Pt states she will report today bp tomorrow on 11/17/19  Pt can be contacted at [phone]

## 2019-11-17 NOTE — Progress Notes (Signed)
   Covid-19 Vaccination Clinic  Name:  Desiree [name]    MRN: [id number] DOB: [birth year]  11/17/2019  Desiree [name] was observed post Covid-19 immunization for 15 minutes without incidence. She was provided with Vaccine Information Sheet and instruction to access the V-Safe system.   Desiree [name] was instructed to call 911 with any severe reactions post vaccine: Marland Kitchen Difficulty breathing  . Swelling of your face and throat  . A fast heartbeat  . A bad rash all over your body  . Dizziness and weakness    Immunizations Administered    Name Date Dose VIS Date Route   Pfizer COVID-19 Vaccine 11/17/2019  2:21 PM 0.3 mL 10/03/2019 Intramuscular   Manufacturer: [employer], [employer]   Lot: [id number]   NDC: [address]-3

## 2019-11-17 NOTE — Telephone Encounter (Signed)
Patient states she received a letter from [employer] and hat the NitroGlycerin 2% ointment is not on their formulary. Patient states she will need a prior authorization.

## 2019-11-18 ENCOUNTER — Other Ambulatory Visit: Payer: Self-pay | Admitting: Internal Medicine

## 2019-11-18 MED ORDER — AMLODIPINE BESYLATE 5 MG PO TABS: 5.0000 mg | ORAL_TABLET | Freq: Every day | ORAL | 0 refills | Status: DC

## 2019-11-18 NOTE — Telephone Encounter (Signed)
Received notification from [facility] regarding a prior authorization for Nitro-BID 2%. Authorization has been APPROVED from 11/17/19  to 10/22/20.   Will send document to scan center.  Patient advised.

## 2019-11-18 NOTE — Telephone Encounter (Signed)
Submitted a Prior Authorization request to [employer] for Nitro Bid 2% via Cover My Meds. Will update once we receive a response.

## 2019-11-18 NOTE — Telephone Encounter (Signed)
Pt has been notified she has been taking losartan and medication has been sent in

## 2019-11-18 NOTE — Telephone Encounter (Signed)
Please advise 

## 2019-11-18 NOTE — Telephone Encounter (Signed)
So I agree readings too high  I suggest we go back on the amlodipine 5 mg per day  In addition to the 25 mg of losartan  ( are you taking this?)   If  You agree  Please send in rx for amlodipine 5 mg disp 90   Plan check readings and plan ROV   Or Virtual   (  Prefer this over telephone)  in 1-2 months

## 2019-11-19 DIAGNOSIS — M79646 Pain in unspecified finger(s): Secondary | ICD-10-CM | POA: Diagnosis not present

## 2019-11-20 ENCOUNTER — Other Ambulatory Visit: Payer: Self-pay | Admitting: *Deleted

## 2019-11-20 MED ORDER — NITRO-BID 2 % TD OINT: TOPICAL_OINTMENT | TRANSDERMAL | 0 refills | Status: DC

## 2019-11-20 NOTE — Telephone Encounter (Signed)
Refill request received via fax  Last Visit: 10/10/19 Next Visit: 12/05/19  Okay to refill per Dr. Corliss Skains
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
## 2019-11-26 DIAGNOSIS — N950 Postmenopausal bleeding: Secondary | ICD-10-CM | POA: Diagnosis not present

## 2019-11-26 DIAGNOSIS — Z1231 Encounter for screening mammogram for malignant neoplasm of breast: Secondary | ICD-10-CM | POA: Diagnosis not present

## 2019-11-26 DIAGNOSIS — Z6822 Body mass index (BMI) 22.0-22.9, adult: Secondary | ICD-10-CM | POA: Diagnosis not present

## 2019-11-26 DIAGNOSIS — Z01419 Encounter for gynecological examination (general) (routine) without abnormal findings: Secondary | ICD-10-CM | POA: Diagnosis not present

## 2019-12-02 ENCOUNTER — Other Ambulatory Visit: Payer: Self-pay | Admitting: *Deleted

## 2019-12-02 MED ORDER — NITRO-BID 2 % TD OINT: TOPICAL_OINTMENT | TRANSDERMAL | 0 refills | Status: DC

## 2019-12-02 NOTE — Telephone Encounter (Signed)
Refill request received via fax  Last Visit: 10/10/19 Next Visit: 12/05/19  Okay to refill per Dr. Deveshwar  

## 2019-12-04 DIAGNOSIS — H40021 Open angle with borderline findings, high risk, right eye: Secondary | ICD-10-CM | POA: Diagnosis not present

## 2019-12-04 DIAGNOSIS — E119 Type 2 diabetes mellitus without complications: Secondary | ICD-10-CM | POA: Diagnosis not present

## 2019-12-04 DIAGNOSIS — H04123 Dry eye syndrome of bilateral lacrimal glands: Secondary | ICD-10-CM | POA: Diagnosis not present

## 2019-12-04 DIAGNOSIS — H25013 Cortical age-related cataract, bilateral: Secondary | ICD-10-CM | POA: Diagnosis not present

## 2019-12-05 ENCOUNTER — Other Ambulatory Visit: Payer: Self-pay

## 2019-12-05 ENCOUNTER — Ambulatory Visit: Payer: Medicare HMO | Admitting: Rheumatology

## 2019-12-05 ENCOUNTER — Encounter: Payer: Self-pay | Admitting: Rheumatology

## 2019-12-05 VITALS — BP 125/64 | HR 113 | Resp 16 | Ht 62.5 in | Wt 130.6 lb

## 2019-12-05 DIAGNOSIS — M5030 Other cervical disc degeneration, unspecified cervical region: Secondary | ICD-10-CM | POA: Diagnosis not present

## 2019-12-05 DIAGNOSIS — R768 Other specified abnormal immunological findings in serum: Secondary | ICD-10-CM

## 2019-12-05 DIAGNOSIS — Z79899 Other long term (current) drug therapy: Secondary | ICD-10-CM

## 2019-12-05 DIAGNOSIS — Z8659 Personal history of other mental and behavioral disorders: Secondary | ICD-10-CM

## 2019-12-05 DIAGNOSIS — M19041 Primary osteoarthritis, right hand: Secondary | ICD-10-CM | POA: Diagnosis not present

## 2019-12-05 DIAGNOSIS — Z87891 Personal history of nicotine dependence: Secondary | ICD-10-CM

## 2019-12-05 DIAGNOSIS — M7541 Impingement syndrome of right shoulder: Secondary | ICD-10-CM | POA: Diagnosis not present

## 2019-12-05 DIAGNOSIS — R296 Repeated falls: Secondary | ICD-10-CM

## 2019-12-05 DIAGNOSIS — M51369 Other intervertebral disc degeneration, lumbar region without mention of lumbar back pain or lower extremity pain: Secondary | ICD-10-CM

## 2019-12-05 DIAGNOSIS — M19071 Primary osteoarthritis, right ankle and foot: Secondary | ICD-10-CM | POA: Diagnosis not present

## 2019-12-05 DIAGNOSIS — Z8639 Personal history of other endocrine, nutritional and metabolic disease: Secondary | ICD-10-CM

## 2019-12-05 DIAGNOSIS — I7301 Raynaud's syndrome with gangrene: Secondary | ICD-10-CM | POA: Diagnosis not present

## 2019-12-05 DIAGNOSIS — M170 Bilateral primary osteoarthritis of knee: Secondary | ICD-10-CM

## 2019-12-05 DIAGNOSIS — M19042 Primary osteoarthritis, left hand: Secondary | ICD-10-CM

## 2019-12-05 DIAGNOSIS — M19072 Primary osteoarthritis, left ankle and foot: Secondary | ICD-10-CM

## 2019-12-05 DIAGNOSIS — J479 Bronchiectasis, uncomplicated: Secondary | ICD-10-CM

## 2019-12-05 DIAGNOSIS — E782 Mixed hyperlipidemia: Secondary | ICD-10-CM

## 2019-12-05 DIAGNOSIS — M5136 Other intervertebral disc degeneration, lumbar region: Secondary | ICD-10-CM | POA: Diagnosis not present

## 2019-12-05 DIAGNOSIS — I10 Essential (primary) hypertension: Secondary | ICD-10-CM

## 2019-12-08 ENCOUNTER — Ambulatory Visit: Payer: Medicare HMO | Attending: Internal Medicine

## 2019-12-08 ENCOUNTER — Telehealth: Payer: Self-pay | Admitting: Internal Medicine

## 2019-12-08 DIAGNOSIS — Z23 Encounter for immunization: Secondary | ICD-10-CM | POA: Insufficient documentation

## 2019-12-08 NOTE — Progress Notes (Signed)
   Covid-19 Vaccination Clinic  Name:  Desiree [name]    MRN: [id number] DOB: 05/06/1951  12/08/2019  Desiree [name] was observed post Covid-19 immunization for 15 minutes without incidence. She was provided with Vaccine Information Sheet and instruction to access the V-Safe system.   Desiree [name] was instructed to call 911 with any severe reactions post vaccine: Marland Kitchen Difficulty breathing  . Swelling of your face and throat  . A fast heartbeat  . A bad rash all over your body  . Dizziness and weakness    Immunizations Administered    Name Date Dose VIS Date Route   Pfizer COVID-19 Vaccine 12/08/2019 12:17 PM 0.3 mL 10/03/2019 Intramuscular   Manufacturer: [employer], [employer]   Lot: [id number]   NDC: [address]-6

## 2019-12-08 NOTE — Telephone Encounter (Signed)
Pt called because she received a denial from her insurance for [facility] 11/19/19 where she had her finger rechecked from her past surgery because she did not have a referral. Pt would like a call back from Dr. Fabian Sharp or her nurse.

## 2019-12-08 NOTE — Telephone Encounter (Signed)
Pt last blood pressure was done at Rheumatologist office on last Friday and is going up and down.

## 2019-12-09 NOTE — Telephone Encounter (Signed)
Agree with referral   For her raynauds with gangrene   Please do referral.   In regard to BP  Last one was normal in record . Please  Make a virtual (  Or in person)  visit in the next month   To discuss medications  management

## 2019-12-10 ENCOUNTER — Other Ambulatory Visit: Payer: Self-pay

## 2019-12-10 DIAGNOSIS — I7301 Raynaud's syndrome with gangrene: Secondary | ICD-10-CM

## 2019-12-10 NOTE — Telephone Encounter (Signed)
Referral has been placed as requested lvm for pt to call back to set up appt in ov or virtual about BP

## 2019-12-11 ENCOUNTER — Ambulatory Visit: Payer: Medicare HMO | Admitting: Critical Care Medicine

## 2019-12-16 ENCOUNTER — Ambulatory Visit: Payer: Medicare HMO | Admitting: Critical Care Medicine

## 2019-12-23 ENCOUNTER — Other Ambulatory Visit: Payer: Self-pay | Admitting: *Deleted

## 2019-12-23 MED ORDER — NITRO-BID 2 % TD OINT: TOPICAL_OINTMENT | TRANSDERMAL | 0 refills | Status: DC

## 2019-12-23 NOTE — Telephone Encounter (Signed)
Refill Request received via fax  Last Visit: 12/05/19 Next Visit: 03/03/20  Okay to refill per Dr.Deveshwar

## 2019-12-30 ENCOUNTER — Other Ambulatory Visit: Payer: Self-pay

## 2019-12-30 DIAGNOSIS — N930 Postcoital and contact bleeding: Secondary | ICD-10-CM | POA: Diagnosis not present

## 2019-12-30 DIAGNOSIS — N858 Other specified noninflammatory disorders of uterus: Secondary | ICD-10-CM | POA: Diagnosis not present

## 2019-12-30 DIAGNOSIS — N183 Chronic kidney disease, stage 3 unspecified: Secondary | ICD-10-CM | POA: Diagnosis not present

## 2019-12-30 NOTE — Patient Outreach (Signed)
[facility]) Care Management  12/30/2019  Desiree [name] 13-Jul-1951 [id number]   Telephone call to patient for disease management follow up. No answer. HIPAA compliant voice message left.    Plan: RN CM will attempt again in the month of May and send letter.  Bary Leriche, RN, MSN [facility] Care Management Care Management Coordinator Direct Line [phone] Cell [phone] Toll Free: [phone]  Fax: [phone]

## 2019-12-31 ENCOUNTER — Other Ambulatory Visit: Payer: Self-pay

## 2019-12-31 ENCOUNTER — Ambulatory Visit: Payer: Self-pay

## 2019-12-31 ENCOUNTER — Encounter: Payer: Self-pay | Admitting: Critical Care Medicine

## 2019-12-31 ENCOUNTER — Ambulatory Visit: Payer: Medicare HMO | Admitting: Critical Care Medicine

## 2019-12-31 VITALS — BP 108/58 | HR 92 | Temp 97.2°F | Ht 62.5 in | Wt 132.8 lb

## 2019-12-31 DIAGNOSIS — J479 Bronchiectasis, uncomplicated: Secondary | ICD-10-CM

## 2019-12-31 DIAGNOSIS — J449 Chronic obstructive pulmonary disease, unspecified: Secondary | ICD-10-CM

## 2019-12-31 MED ORDER — IPRATROPIUM-ALBUTEROL 0.5-2.5 (3) MG/3ML IN SOLN: 3.0000 mL | RESPIRATORY_TRACT | 11 refills | Status: DC | PRN

## 2019-12-31 MED ORDER — SODIUM CHLORIDE 3 % IN NEBU: INHALATION_SOLUTION | Freq: Two times a day (BID) | RESPIRATORY_TRACT | 12 refills | Status: DC

## 2019-12-31 MED ORDER — ANORO ELLIPTA 62.5-25 MCG/INH IN AEPB: 1.0000 | INHALATION_SPRAY | Freq: Every day | RESPIRATORY_TRACT | 11 refills | Status: DC
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
## 2019-12-31 NOTE — Patient Instructions (Addendum)
Thank you for visiting Dr. Chestine Spore at [facility]. We recommend the following:   Keep taking anoro once daily. Use nebulizer with saline chloride or ipratropium-albuterol with vest at least once per day.  Use ipratropium-albuterol nebulizer every afternoon.  Meds ordered this encounter  Medications  . umeclidinium-vilanterol (ANORO ELLIPTA) 62.5-25 MCG/INH AEPB    Sig: Inhale 1 puff into the lungs daily.    Dispense:  1 each    Refill:  11  . ipratropium-albuterol (DUONEB) 0.5-2.5 (3) MG/3ML SOLN    Sig: Take 3 mLs by nebulization every 4 (four) hours as needed.    Dispense:  360 mL    Refill:  11  . sodium chloride HYPERTONIC 3 % nebulizer solution    Sig: Take by nebulization 2 (two) times daily.    Dispense:  750 mL    Refill:  12    Return in about 3 months (around 04/01/2020).    Please do your part to reduce the spread of COVID-19.

## 2020-01-05 ENCOUNTER — Other Ambulatory Visit: Payer: Self-pay

## 2020-01-05 DIAGNOSIS — N183 Chronic kidney disease, stage 3 unspecified: Secondary | ICD-10-CM | POA: Diagnosis not present

## 2020-01-05 DIAGNOSIS — E1122 Type 2 diabetes mellitus with diabetic chronic kidney disease: Secondary | ICD-10-CM | POA: Diagnosis not present

## 2020-01-05 DIAGNOSIS — D631 Anemia in chronic kidney disease: Secondary | ICD-10-CM | POA: Diagnosis not present

## 2020-01-05 DIAGNOSIS — I129 Hypertensive chronic kidney disease with stage 1 through stage 4 chronic kidney disease, or unspecified chronic kidney disease: Secondary | ICD-10-CM | POA: Diagnosis not present

## 2020-01-05 DIAGNOSIS — N2581 Secondary hyperparathyroidism of renal origin: Secondary | ICD-10-CM | POA: Diagnosis not present

## 2020-01-05 MED ORDER — STIOLTO RESPIMAT 2.5-2.5 MCG/ACT IN AERS: 2.0000 | INHALATION_SPRAY | Freq: Every day | RESPIRATORY_TRACT | 6 refills | Status: DC

## 2020-01-06 ENCOUNTER — Other Ambulatory Visit: Payer: Self-pay
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
## 2020-01-07 ENCOUNTER — Encounter: Payer: Self-pay | Admitting: Internal Medicine

## 2020-01-07 ENCOUNTER — Ambulatory Visit (INDEPENDENT_AMBULATORY_CARE_PROVIDER_SITE_OTHER): Payer: Medicare HMO | Admitting: Internal Medicine

## 2020-01-07 VITALS — BP 130/60 | HR 104 | Temp 97.7°F | Wt 135.4 lb

## 2020-01-07 DIAGNOSIS — Z789 Other specified health status: Secondary | ICD-10-CM | POA: Diagnosis not present

## 2020-01-07 DIAGNOSIS — M2042 Other hammer toe(s) (acquired), left foot: Secondary | ICD-10-CM | POA: Diagnosis not present

## 2020-01-07 DIAGNOSIS — I7301 Raynaud's syndrome with gangrene: Secondary | ICD-10-CM

## 2020-01-07 DIAGNOSIS — I10 Essential (primary) hypertension: Secondary | ICD-10-CM | POA: Diagnosis not present

## 2020-01-07 DIAGNOSIS — N289 Disorder of kidney and ureter, unspecified: Secondary | ICD-10-CM | POA: Diagnosis not present

## 2020-01-07 DIAGNOSIS — M2041 Other hammer toe(s) (acquired), right foot: Secondary | ICD-10-CM | POA: Diagnosis not present

## 2020-01-07 DIAGNOSIS — Z79899 Other long term (current) drug therapy: Secondary | ICD-10-CM

## 2020-01-07 DIAGNOSIS — E1122 Type 2 diabetes mellitus with diabetic chronic kidney disease: Secondary | ICD-10-CM | POA: Diagnosis not present

## 2020-01-07 LAB — POCT GLYCOSYLATED HEMOGLOBIN (HGB A1C): Hemoglobin A1C: 5.7 % — AB (ref 4.0–5.6)

## 2020-01-07 NOTE — Progress Notes (Signed)
Pt aware during visit.   

## 2020-01-07 NOTE — Patient Instructions (Signed)
Stay on same  BP meds for now  Let us know if bp goes to low 100 and below  .   Need to get dr Jill Alexanders   Lab tests.   Will be on look out for this   Wt Readings from Last 3 Encounters:  01/07/20 135 lb 6.4 oz (61.4 kg)  12/31/19 132 lb 12.8 oz (60.2 kg)  12/05/19 130 lb 9.6 oz (59.2 kg)   BP Readings from Last 3 Encounters:  01/07/20 130/60  12/31/19 (!) 108/58  12/05/19 125/64   consider mammogram.   If over due  If last year is ok.  DMis good

## 2020-01-08 ENCOUNTER — Telehealth: Payer: Self-pay | Admitting: Critical Care Medicine

## 2020-01-08 NOTE — Telephone Encounter (Signed)
Stiolto or Anoro, but not both. They have the same 2 classes of medications in them, so they are interchangable.  Steffanie Dunn, DO 01/08/20 4:31 PM [facility]

## 2020-01-08 NOTE — Telephone Encounter (Signed)
Called  Spoke with patient who is asking for clarification on whether or not to take her Stiolto; would also like to know if "Stiolto is stronger than Anoro".  Patient expressed understanding about the Anoro, Duoneb and vest therapy.  Dr Chestine Spore please advise, thank you.  Patient last seen 3.10.21: Assessment & Plan:        ICD-10-CM    1. Bronchiectasis without complication (HCC)  J47.9    2. COPD mixed type (HCC)  J44.9            Chronic bronchiectasis- stable but not optimized. -Discussed the importance of airway clearance therapy. Restart hypertonic saline nebs 1-2 times daily.  For efficiency she should use concurrently with her vest therapy. -AFB, fungal, routine respiratory cultures if able to produce them -Avoid ICS due to risk of chronic infections. If she develops exacerbations that seem to be related to allergies, it may be necessary to use it. -Continue LAMA/ LABA- Anoro once daily.   -Can add duonebs Q4h PRN     COPD -Continue LAMA/ LABA- Anoro  -DuoNebs as needed; recommend adding in the afternoon or before Anoro in the morning. Recommend airway clearance therapy before Anoro  -Up-to-date on seasonal flu, pneumonia, covid vaccines

## 2020-01-08 NOTE — Telephone Encounter (Signed)
Pt returning call.  [phone]

## 2020-01-08 NOTE — Telephone Encounter (Signed)
LMOMTCB x 1 

## 2020-01-09 ENCOUNTER — Telehealth: Payer: Self-pay | Admitting: Internal Medicine

## 2020-01-09 ENCOUNTER — Telehealth: Payer: Self-pay | Admitting: Critical Care Medicine

## 2020-01-09 NOTE — Telephone Encounter (Signed)
Pt call and stated her losarten is 25GM and not  50MG 

## 2020-01-09 NOTE — Telephone Encounter (Signed)
Left message for patient to call back on Monday 

## 2020-01-09 NOTE — Telephone Encounter (Signed)
Called spoke with patient and discussed Dr Ophelia Charter recommendations as stated below.  Patient expressed understanding though she does have some confusion on why her AVS says Anoro but Stiolto was sent to the pharmacy.  Patient denies any insurance changes and her Anoro copay was $45, which is typical for 2021 and she believes the Anoro may be slightly cheaper than she Stiolto that she just filled.  Advised patient to complete the Stiolto 30 day inhaler since she just bought it and see how she does with it.  If at the end of the 30 days she feels like she did better with the Anoro, then call the office so that we may see about switching them since Dr Chestine Spore stated they are interchangable.  Patient happy with this solution and voiced her understanding.  Nothing further needed at this time; will sign off.

## 2020-01-09 NOTE — Telephone Encounter (Signed)
Called pt and advised that chart reflects 25 mg. Pt verbalized understanding. No further action needed at this time!

## 2020-01-13 ENCOUNTER — Emergency Department (HOSPITAL_COMMUNITY)
Admission: EM | Admit: 2020-01-13 | Discharge: 2020-01-14 | Disposition: A | Discharge status: Home / Self Care | Payer: Medicare HMO | Attending: Emergency Medicine | Admitting: Emergency Medicine

## 2020-01-13 ENCOUNTER — Encounter (HOSPITAL_COMMUNITY): Payer: Self-pay | Admitting: Emergency Medicine

## 2020-01-13 ENCOUNTER — Other Ambulatory Visit: Payer: Self-pay

## 2020-01-13 DIAGNOSIS — S3992XA Unspecified injury of lower back, initial encounter: Secondary | ICD-10-CM | POA: Diagnosis not present

## 2020-01-13 DIAGNOSIS — Y999 Unspecified external cause status: Secondary | ICD-10-CM | POA: Diagnosis not present

## 2020-01-13 DIAGNOSIS — Y9389 Activity, other specified: Secondary | ICD-10-CM | POA: Insufficient documentation

## 2020-01-13 DIAGNOSIS — S39012A Strain of muscle, fascia and tendon of lower back, initial encounter: Secondary | ICD-10-CM

## 2020-01-13 DIAGNOSIS — I10 Essential (primary) hypertension: Secondary | ICD-10-CM | POA: Diagnosis not present

## 2020-01-13 DIAGNOSIS — E1122 Type 2 diabetes mellitus with diabetic chronic kidney disease: Secondary | ICD-10-CM | POA: Insufficient documentation

## 2020-01-13 DIAGNOSIS — M542 Cervicalgia: Secondary | ICD-10-CM | POA: Diagnosis not present

## 2020-01-13 DIAGNOSIS — J449 Chronic obstructive pulmonary disease, unspecified: Secondary | ICD-10-CM | POA: Diagnosis not present

## 2020-01-13 DIAGNOSIS — S161XXA Strain of muscle, fascia and tendon at neck level, initial encounter: Secondary | ICD-10-CM | POA: Insufficient documentation

## 2020-01-13 DIAGNOSIS — Y92410 Unspecified street and highway as the place of occurrence of the external cause: Secondary | ICD-10-CM | POA: Diagnosis not present

## 2020-01-13 DIAGNOSIS — N189 Chronic kidney disease, unspecified: Secondary | ICD-10-CM | POA: Insufficient documentation

## 2020-01-13 DIAGNOSIS — S199XXA Unspecified injury of neck, initial encounter: Secondary | ICD-10-CM | POA: Diagnosis not present

## 2020-01-13 DIAGNOSIS — Z7982 Long term (current) use of aspirin: Secondary | ICD-10-CM | POA: Insufficient documentation

## 2020-01-13 DIAGNOSIS — R519 Headache, unspecified: Secondary | ICD-10-CM | POA: Insufficient documentation

## 2020-01-13 DIAGNOSIS — I129 Hypertensive chronic kidney disease with stage 1 through stage 4 chronic kidney disease, or unspecified chronic kidney disease: Secondary | ICD-10-CM | POA: Diagnosis not present

## 2020-01-13 DIAGNOSIS — R079 Chest pain, unspecified: Secondary | ICD-10-CM | POA: Diagnosis not present

## 2020-01-13 DIAGNOSIS — S0990XA Unspecified injury of head, initial encounter: Secondary | ICD-10-CM | POA: Diagnosis not present

## 2020-01-13 DIAGNOSIS — Z79899 Other long term (current) drug therapy: Secondary | ICD-10-CM | POA: Diagnosis not present

## 2020-01-13 DIAGNOSIS — S299XXA Unspecified injury of thorax, initial encounter: Secondary | ICD-10-CM | POA: Diagnosis not present

## 2020-01-13 DIAGNOSIS — Z87891 Personal history of nicotine dependence: Secondary | ICD-10-CM | POA: Insufficient documentation

## 2020-01-13 DIAGNOSIS — R0789 Other chest pain: Secondary | ICD-10-CM | POA: Diagnosis not present

## 2020-01-13 MED ORDER — OXYCODONE-ACETAMINOPHEN 5-325 MG PO TABS
1.0000 | ORAL_TABLET | Freq: Once | ORAL | Status: AC
  Administered 2020-01-14: 1 via ORAL
  Filled 2020-01-13: qty 1
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
## 2020-01-13 NOTE — Telephone Encounter (Signed)
LMTCB x2 for pt 

## 2020-01-14 ENCOUNTER — Emergency Department (HOSPITAL_COMMUNITY): Payer: Medicare HMO

## 2020-01-14 DIAGNOSIS — M542 Cervicalgia: Secondary | ICD-10-CM | POA: Diagnosis not present

## 2020-01-14 DIAGNOSIS — S3992XA Unspecified injury of lower back, initial encounter: Secondary | ICD-10-CM | POA: Diagnosis not present

## 2020-01-14 DIAGNOSIS — S199XXA Unspecified injury of neck, initial encounter: Secondary | ICD-10-CM | POA: Diagnosis not present

## 2020-01-14 DIAGNOSIS — S299XXA Unspecified injury of thorax, initial encounter: Secondary | ICD-10-CM | POA: Diagnosis not present

## 2020-01-14 DIAGNOSIS — R519 Headache, unspecified: Secondary | ICD-10-CM | POA: Diagnosis not present

## 2020-01-14 DIAGNOSIS — S0990XA Unspecified injury of head, initial encounter: Secondary | ICD-10-CM | POA: Diagnosis not present

## 2020-01-14 MED ORDER — MELOXICAM 7.5 MG PO TABS: 7.5000 mg | ORAL_TABLET | Freq: Every day | ORAL | 0 refills | Status: DC

## 2020-01-14 MED ORDER — METHOCARBAMOL 500 MG PO TABS: 500.0000 mg | ORAL_TABLET | Freq: Three times a day (TID) | ORAL | 0 refills | Status: DC | PRN

## 2020-01-14 NOTE — Telephone Encounter (Signed)
LMTCB x3 for pt. We have attempted to contact pt several times with no success or call back from pt. Per triage protocol, message will be closed.   

## 2020-01-15 ENCOUNTER — Other Ambulatory Visit: Payer: Self-pay | Admitting: Pulmonary Disease
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
## 2020-01-19 ENCOUNTER — Encounter: Payer: Self-pay | Admitting: Internal Medicine

## 2020-01-19 ENCOUNTER — Other Ambulatory Visit: Payer: Self-pay

## 2020-01-19 ENCOUNTER — Telehealth: Payer: Self-pay | Admitting: Internal Medicine

## 2020-01-19 ENCOUNTER — Ambulatory Visit (INDEPENDENT_AMBULATORY_CARE_PROVIDER_SITE_OTHER): Payer: Medicare HMO | Admitting: Internal Medicine

## 2020-01-19 DIAGNOSIS — M549 Dorsalgia, unspecified: Secondary | ICD-10-CM | POA: Diagnosis not present

## 2020-01-19 DIAGNOSIS — S39012A Strain of muscle, fascia and tendon of lower back, initial encounter: Secondary | ICD-10-CM

## 2020-01-19 DIAGNOSIS — Z9889 Other specified postprocedural states: Secondary | ICD-10-CM

## 2020-01-19 DIAGNOSIS — S161XXS Strain of muscle, fascia and tendon at neck level, sequela: Secondary | ICD-10-CM

## 2020-01-19 NOTE — Patient Instructions (Signed)
Will do a referral to dr Aleene Davidson al,  For assessment  And  Therapy  For your back and neck pain strain  .since you had surgery in past.  I suspect this will resolve with time but therapy can be helpful  To get you feeling better sooner.   Soft tissue injury may take weeks to resolve but hopefully sooner than later.

## 2020-01-19 NOTE — Chronic Care Management (AMB) (Signed)
  Chronic Care Management   Note  01/19/2020 Name: LEANNY MOECKEL MRN: [id number] DOB: [birth year]  DEIONDRA DENLEY is a 69 y.o. year old female who is a primary care patient of [name], Neta Mends, MD. I reached out to [name] by phone today in response to a referral sent by Ms. Pincus [employer]'s PCP, [name], Neta Mends, MD.   Ms. Mayers was given information about Chronic Care Management services today including:  1. CCM service includes personalized support from designated clinical staff supervised by her physician, including individualized plan of care and coordination with other care providers 2. 24/7 contact phone numbers for assistance for urgent and routine care needs. 3. Service will only be billed when office clinical staff spend 20 minutes or more in a month to coordinate care. 4. Only one practitioner may furnish and bill the service in a calendar month. 5. The patient may stop CCM services at any time (effective at the end of the month) by phone call to the office staff.   Patient agreed to services and verbal consent obtained.   Follow up plan:   Raynicia Dukes [employer] Scheduler

## 2020-01-20 ENCOUNTER — Encounter: Payer: Self-pay | Admitting: Orthopaedic Surgery

## 2020-01-20 ENCOUNTER — Ambulatory Visit (INDEPENDENT_AMBULATORY_CARE_PROVIDER_SITE_OTHER): Payer: Medicare HMO | Admitting: Orthopaedic Surgery

## 2020-01-20 VITALS — BP 113/75 | HR 96 | Ht 62.5 in | Wt 137.0 lb

## 2020-01-20 DIAGNOSIS — M5030 Other cervical disc degeneration, unspecified cervical region: Secondary | ICD-10-CM | POA: Diagnosis not present

## 2020-01-20 DIAGNOSIS — M5136 Other intervertebral disc degeneration, lumbar region: Secondary | ICD-10-CM

## 2020-01-20 DIAGNOSIS — M51369 Other intervertebral disc degeneration, lumbar region without mention of lumbar back pain or lower extremity pain: Secondary | ICD-10-CM
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
## 2020-01-21 ENCOUNTER — Telehealth: Payer: Self-pay | Admitting: Orthopaedic Surgery

## 2020-01-21 NOTE — Telephone Encounter (Signed)
Patient called.   She is requesting another soft collar.   Call back: [phone]

## 2020-01-21 NOTE — Telephone Encounter (Signed)
Neck xrays look great she should be fine in a couple days and not need collar ucall

## 2020-01-21 NOTE — Telephone Encounter (Signed)
Please advise. OK for patient to come in and get collar?

## 2020-01-22 NOTE — Telephone Encounter (Signed)
I called patient and advised. 

## 2020-01-30 ENCOUNTER — Telehealth: Payer: Self-pay | Admitting: Critical Care Medicine

## 2020-01-30 MED ORDER — ANORO ELLIPTA 62.5-25 MCG/INH IN AEPB: 1.0000 | INHALATION_SPRAY | Freq: Every day | RESPIRATORY_TRACT | 5 refills | Status: DC

## 2020-01-30 NOTE — Telephone Encounter (Signed)
Called and spoke with pt letting her know that Dr. Chestine Spore said she could go back on anoro. Pt stated she did need a new rx so I verified pt's preferred pharmacy and sent this in for pt. Nothing further needed.

## 2020-01-30 NOTE — Telephone Encounter (Signed)
Called and spoke with pt. Pt is wanting to know if she could go back on anoro. Dr. Chestine Spore, please advise on this. I am also routing this to [facility] to follow up on as well as I am leaving early today 4/9 in case this is sent back to me instead of triage.

## 2020-01-30 NOTE — Telephone Encounter (Signed)
Yes, certainly. If she needs a new prescription please provide refills.  Steffanie Dunn, DO 01/30/20 12:57 PM [facility]

## 2020-02-09 ENCOUNTER — Encounter: Payer: Self-pay | Admitting: Orthopaedic Surgery

## 2020-02-09 ENCOUNTER — Ambulatory Visit: Payer: Medicare HMO | Admitting: Orthopaedic Surgery

## 2020-02-09 ENCOUNTER — Other Ambulatory Visit: Payer: Self-pay

## 2020-02-09 VITALS — BP 124/83 | HR 95 | Ht 62.5 in | Wt 132.0 lb

## 2020-02-09 DIAGNOSIS — M545 Low back pain, unspecified: Secondary | ICD-10-CM

## 2020-02-09 DIAGNOSIS — M51369 Other intervertebral disc degeneration, lumbar region without mention of lumbar back pain or lower extremity pain: Secondary | ICD-10-CM

## 2020-02-09 DIAGNOSIS — M542 Cervicalgia: Secondary | ICD-10-CM | POA: Diagnosis not present

## 2020-02-09 DIAGNOSIS — Z981 Arthrodesis status: Secondary | ICD-10-CM | POA: Diagnosis not present

## 2020-02-09 DIAGNOSIS — M5136 Other intervertebral disc degeneration, lumbar region: Secondary | ICD-10-CM
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
## 2020-02-10 ENCOUNTER — Other Ambulatory Visit: Payer: Self-pay

## 2020-02-10 DIAGNOSIS — I10 Essential (primary) hypertension: Secondary | ICD-10-CM

## 2020-02-10 DIAGNOSIS — J449 Chronic obstructive pulmonary disease, unspecified: Secondary | ICD-10-CM

## 2020-02-11 ENCOUNTER — Other Ambulatory Visit: Payer: Self-pay

## 2020-02-11 ENCOUNTER — Ambulatory Visit: Payer: Medicare HMO

## 2020-02-11 DIAGNOSIS — E1122 Type 2 diabetes mellitus with diabetic chronic kidney disease: Secondary | ICD-10-CM

## 2020-02-11 DIAGNOSIS — I10 Essential (primary) hypertension: Secondary | ICD-10-CM

## 2020-02-11 DIAGNOSIS — J449 Chronic obstructive pulmonary disease, unspecified: Secondary | ICD-10-CM

## 2020-02-11 DIAGNOSIS — J3489 Other specified disorders of nose and nasal sinuses: Secondary | ICD-10-CM

## 2020-02-11 DIAGNOSIS — I7301 Raynaud's syndrome with gangrene: Secondary | ICD-10-CM

## 2020-02-11 DIAGNOSIS — E785 Hyperlipidemia, unspecified: Secondary | ICD-10-CM

## 2020-02-11 DIAGNOSIS — F3132 Bipolar disorder, current episode depressed, moderate: Secondary | ICD-10-CM

## 2020-02-11 DIAGNOSIS — N289 Disorder of kidney and ureter, unspecified: Secondary | ICD-10-CM

## 2020-02-11 DIAGNOSIS — M170 Bilateral primary osteoarthritis of knee: Secondary | ICD-10-CM
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
## 2020-02-12 ENCOUNTER — Ambulatory Visit: Payer: Medicare HMO | Admitting: Podiatry

## 2020-02-12 ENCOUNTER — Telehealth: Payer: Self-pay | Admitting: Critical Care Medicine

## 2020-02-12 MED ORDER — ANORO ELLIPTA 62.5-25 MCG/INH IN AEPB: 1.0000 | INHALATION_SPRAY | Freq: Every day | RESPIRATORY_TRACT | 5 refills | Status: DC

## 2020-02-12 NOTE — Telephone Encounter (Signed)
Rx for Anoro sent to [facility]. Nothing further is needed.

## 2020-02-13 ENCOUNTER — Telehealth: Payer: Self-pay | Admitting: Physician Assistant

## 2020-02-13 ENCOUNTER — Telehealth: Payer: Self-pay | Admitting: Critical Care Medicine

## 2020-02-13 MED ORDER — MONTELUKAST SODIUM 10 MG PO TABS: ORAL_TABLET | ORAL | 5 refills | Status: DC

## 2020-02-13 MED ORDER — IPRATROPIUM-ALBUTEROL 0.5-2.5 (3) MG/3ML IN SOLN: 3.0000 mL | RESPIRATORY_TRACT | 5 refills | Status: DC | PRN

## 2020-02-13 NOTE — Telephone Encounter (Signed)
Patient called back and said that she feels better off the medicine than on the medicine. Said she feels foggy with it. Please call her back

## 2020-02-13 NOTE — Telephone Encounter (Signed)
Pt called and LM. When she takes her lamotrigine and her Duloxetine( hard to understand if that is what she meant) she just doesn't feel the same. Please call to discuss.

## 2020-02-13 NOTE — Telephone Encounter (Signed)
Spoke with patient. She was requesting refills on her Singulair and DuoNeb to be sent to [facility]. She is requesting them to be changed to 30 day supply refills. I advised her that I will go ahead and send in the refills, she verbalized understanding.   While on the phone, she wanted to know if we had received a PA request for Anoro. I advised her that I would go ahead and start the request and would call her back, she verbalized understanding.

## 2020-02-13 NOTE — Telephone Encounter (Signed)
Attempted PA on [email or web address]. Received a message that a PA was already on file for the member for the medication. There is always a note in her chart that states she was able to get this medication for $45 back in March.   Called and spoke with [facility] to make them aware. They tried to process the RX again but it still stated a PA was needed.   Called [employer] at [phone]. Was advised that there is a PA on file but it was denied on March 12th. Forms were sent to our office but they did not receive a response, therefore the claim was closed. They have requested that we will fill out an appeal form and fax it back to them.   Will work on this form on Monday. Routing to myself for follow up.

## 2020-02-13 NOTE — Telephone Encounter (Signed)
Left message to call back  

## 2020-02-15 ENCOUNTER — Other Ambulatory Visit: Payer: Self-pay | Admitting: Internal Medicine

## 2020-02-16 MED ORDER — ANORO ELLIPTA 62.5-25 MCG/INH IN AEPB: 1.0000 | INHALATION_SPRAY | Freq: Every day | RESPIRATORY_TRACT | 0 refills | Status: DC

## 2020-02-16 NOTE — Telephone Encounter (Signed)
Redetermination form has been completed and faxed back to [facility]. I asked for the request to be expedited as she is currently without her Anoro.   Called and spoke with patient. She is aware that the paperwork has been completed and faxed to [facility]. She did state that she was out of her Anoro. I have placed 2 samples at the front door for her. She is aware of this.   Will keep this encounter open for follow up.

## 2020-02-17 ENCOUNTER — Other Ambulatory Visit: Payer: Self-pay

## 2020-02-17 ENCOUNTER — Ambulatory Visit: Payer: Medicare HMO | Admitting: Physical Therapy

## 2020-02-17 ENCOUNTER — Other Ambulatory Visit: Payer: Self-pay | Admitting: Internal Medicine

## 2020-02-17 ENCOUNTER — Encounter: Payer: Self-pay | Admitting: Physical Therapy

## 2020-02-17 DIAGNOSIS — M542 Cervicalgia: Secondary | ICD-10-CM

## 2020-02-17 DIAGNOSIS — M545 Low back pain: Secondary | ICD-10-CM

## 2020-02-17 DIAGNOSIS — M546 Pain in thoracic spine: Secondary | ICD-10-CM

## 2020-02-17 DIAGNOSIS — G8929 Other chronic pain: Secondary | ICD-10-CM

## 2020-02-17 DIAGNOSIS — M6281 Muscle weakness (generalized): Secondary | ICD-10-CM

## 2020-02-17 DIAGNOSIS — R2689 Other abnormalities of gait and mobility: Secondary | ICD-10-CM | POA: Diagnosis not present

## 2020-02-17 NOTE — Patient Instructions (Signed)
Access Code: [email or web address]: [email or web address]: 04/27/2021Prepared by: Arlys John NelsonExercises  Seated Assisted Cervical Rotation with Towel - 2 x daily - 6 x weekly - 1 sets - 10 reps - 5 hold  Supine Lower Trunk Rotation - 2 x daily - 6 x weekly - 10 reps - 1 sets - 5 sec hold  Seated Scapular Retraction - 2 x daily - 6 x weekly - 10 reps - 2-3 sets - 5 sec hold Patient Education  TENS Therapy
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
## 2020-02-18 ENCOUNTER — Ambulatory Visit: Payer: Medicare HMO | Admitting: Physician Assistant

## 2020-02-19 ENCOUNTER — Ambulatory Visit (INDEPENDENT_AMBULATORY_CARE_PROVIDER_SITE_OTHER): Payer: Medicare HMO | Admitting: Rehabilitative and Restorative Service Providers"

## 2020-02-19 ENCOUNTER — Telehealth: Payer: Self-pay | Admitting: Rehabilitative and Restorative Service Providers"

## 2020-02-19 ENCOUNTER — Other Ambulatory Visit: Payer: Self-pay

## 2020-02-19 ENCOUNTER — Encounter: Payer: Self-pay | Admitting: Rehabilitative and Restorative Service Providers"

## 2020-02-19 DIAGNOSIS — M546 Pain in thoracic spine: Secondary | ICD-10-CM

## 2020-02-19 DIAGNOSIS — M6281 Muscle weakness (generalized): Secondary | ICD-10-CM

## 2020-02-19 DIAGNOSIS — G8929 Other chronic pain: Secondary | ICD-10-CM | POA: Diagnosis not present

## 2020-02-19 DIAGNOSIS — R2689 Other abnormalities of gait and mobility: Secondary | ICD-10-CM

## 2020-02-19 DIAGNOSIS — M542 Cervicalgia: Secondary | ICD-10-CM

## 2020-02-19 DIAGNOSIS — M545 Low back pain, unspecified: Secondary | ICD-10-CM

## 2020-02-19 NOTE — Patient Instructions (Signed)
Access Code: [email or web address] URL: [email or web address] Date: 02/19/2020 Prepared by: Chyrel Masson  Exercises Supine Lower Trunk Rotation - 2 x daily - 7 x weekly - 5 reps - 1 sets - 15 hold Supine March - 1 x daily - 7 x weekly - 10 reps - 3 sets Supine Cervical Retraction with Towel - 2 x daily - 7 x weekly - 10 reps - 1 sets - 5 hold Seated Scapular Retraction - 2 x daily - 7 x weekly - 10 reps - 2 sets - 5 hold

## 2020-02-19 NOTE — Telephone Encounter (Signed)
Called Pt. After 10 mins no show for appointment.  LVM and reminded of next scheduled appointment.   Chyrel Masson, PT, DPT, OCS, ATC 02/19/20  2:14 PM
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
## 2020-02-20 NOTE — Progress Notes (Signed)
I have collaborated with the care management provider regarding care management and care coordination activities outlined in this encounter and have reviewed this encounter including documentation in the note and care plan. I am certifying that I agree with the content of this note and encounter as supervising physician.  

## 2020-02-20 NOTE — Patient Instructions (Addendum)
Visit Information  Goals Addressed            This Visit's Progress   . Pharmacy Care Plan       CARE PLAN ENTRY  Current Barriers:  . Chronic Disease Management support, education, and care coordination needs related to Hypertension, Hyperlipidemia, Diabetes, Gastroesophageal Reflux Disease, COPD, Chronic Kidney Disease, and Rhinorrhea, Bipolar disease, constipation  Hypertension . Pharmacist Clinical Goal(s): o Over the next 90 days, patient will work with PharmD and providers to maintain BP goal < [id number] . Current regimen:   Amlodipine 5mg  1 tablet daily  Losartan 25mg  1 tablet daily . Interventions: o Discussed need to continue home blood pressure monitoring.  . Patient self care activities o Patient will check BP daily, document, and provide at future appointments  Hyperlipidemia/ ASCVD . Pharmacist Clinical Goal(s): o Over the next 90 days, patient will work with PharmD and providers to achieve LDL goal < 70 . Current regimen:  o Currently not taking statin medications.  o For prevention: aspirin 81mg , 1 tablet once daily . Interventions: o Discussed patient is over due for blood work. Blood work is pending blood work (including cholesterol levels).  Patient self care activities o Patient will call office to schedule lab appointment.   Diabetes . Pharmacist Clinical Goal(s): o Over the next 180 days, patient will work with PharmD and providers to maintain A1c goal < 7% . Current regimen:  o Currently not taking medications.  . Interventions: o Discussed continue with diet and exercise modifications. . Patient self care activities o Patient to continue with healthy habits (diet and exercise).  COPD . Pharmacist Clinical Goal(s) o Over the next 180 days, patient will work with PharmD and providers to obtain daily inhaler.  . Current regimen:   Anoro, 1 puff daily   Ipratropium- albuterol (Duoneb) 0.5-2.5 mg/ , use 1 vial via nebulizer every 4 hours as  needed   Sodium chloride 3%, use 1 vial via nebulizer twice daily  . Interventions: o Discussed with patient, coordination will occur between pharmacy and provider's office in order to get prior authorization approved.  . Patient self care activities o Patient will continue to use current medications as indicated.   Raynaud's phenomenon . Pharmacist Clinical Goal(s) o Over the next 180 days, patient will continue to use medications as needed and follow up with specialist as indicated.  . Current regimen:   Nitroglycerin 2% ointment, apply to fingertips three times daily  Neosporin ointment, use as needed  . Patient self care activities o Patient will continue to use current medications as indicated.   Bipolar disorder . Pharmacist Clinical Goal(s) o Over the next 180 days, patient will work with and follow up for modifications for current therapy.  . Current regimen:   Lamotrigine 25mg  1 tablet twice daily  Duloxetine 30mg  3 capsules daily  . Interventions: o Discussed with patient importance of adherence. Since not tolerating medications, advised patient to follow up with provider. . Patient self care activities o Patient will contact provider and discuss medications further.   GERD . Pharmacist Clinical Goal(s) o Over the next 180 days, patient will continue with follow up visits with provider (Dr. )  . Current regimen:   Famotidine 40mg  1 tablet once daily . Patient self care activities o Patient will continue to use current medications as indicated.   Constipation . Pharmacist Clinical Goal(s) o Over the next 180 days, patient will continue with follow up visits with provider (Dr.  Charna Elizabeth)  . Current regimen:   Linzess , 1 capsule once daily as needed for constipation . Patient self care activities o Patient will continue to use current medications as indicated.  Osteoarthritis . Pharmacist Clinical Goal(s) o Over the next 180  days, patient will continue with follow up visits with provider (Dr. Ophelia Charter)  . Current regimen:   Acetaminophen 500mg , 2 tablets every 6 hours as needed  Diclofenac 1% gel, 2-4 grams to affected joints up to 4 times daily  . Patient self care activities o Patient will continue to use current medications as indicated.  Rhinorrhea (allergies) . Pharmacist Clinical Goal(s) o Over the next 180 days, patient will continue with follow up visits with provider (Dr. )  . Current regimen:   Montelukast 10mg  1 tablet at bedtime  Cetirizine 10mg , 1 tablet at bedtime   Fluticasone nasal spray, 1 to 2 sprays into both nostrils at bedtime   . Patient self care activities o Patient will continue to use current medications as indicated.  Medication management . Pharmacist Clinical Goal(s): o Over the next 90 days, patient will work with PharmD and providers to maintain optimal medication adherence . Current pharmacy: transitioning to [facility] . Interventions o Comprehensive medication review performed. o Utilize [facility] for medication synchronization, packaging and delivery . Patient self care activities . Patient will take medications as prescribed . Patient will report any questions or concerns to provider   Initial goal documentation        [name] was given information about Chronic Care Management services today including:  1. CCM service includes personalized support from designated clinical staff supervised by her physician, including individualized plan of care and coordination with other care providers 2. 24/7 contact phone numbers for assistance for urgent and routine care needs. 3. Standard insurance, coinsurance, copays and deductibles apply for chronic care management only during months in which we provide at least 20 minutes of these services. Most insurances cover these services at 100%, however patients may be responsible for any copay, coinsurance  and/or deductible if applicable. This service may help you avoid the need for more expensive face-to-face services. 4. Only one practitioner may furnish and bill the service in a calendar month. 5. The patient may stop CCM services at any time (effective at the end of the month) by phone call to the office staff.  Patient agreed to services and verbal consent obtained.   The patient verbalized understanding of instructions provided today and agreed to receive a mailed copy of patient instruction and/or educational materials.  Telephone follow up appointment with pharmacy team member scheduled for: 05/12/2020  , PharmD Clinical Pharmacist [facility] [phone]   High Cholesterol  High cholesterol is a condition in which the blood has high levels of a white, waxy, fat-like substance (cholesterol). The human body needs small amounts of cholesterol. The liver makes all the cholesterol that the body needs. Extra (excess) cholesterol comes from the food that we eat. Cholesterol is carried from the liver by the blood through the blood vessels. If you have high cholesterol, deposits (plaques) may build up on the walls of your blood vessels (arteries). Plaques make the arteries narrower and stiffer. Cholesterol plaques increase your risk for heart attack and stroke. Work with your health care provider to keep your cholesterol levels in a healthy range. What increases the risk? This condition is more likely to develop in people who:  Eat foods that are  high in animal fat (saturated fat) or cholesterol.  Are overweight.  Are not getting enough exercise.  Have a family history of high cholesterol. What are the signs or symptoms? There are no symptoms of this condition. How is this diagnosed? This condition may be diagnosed from the results of a blood test.  If you are older than age 2, your health care provider may check your cholesterol every 4-6  years.  You may be checked more often if you already have high cholesterol or other risk factors for heart disease. The blood test for cholesterol measures:  "Bad" cholesterol (LDL cholesterol). This is the main type of cholesterol that causes heart disease. The desired level for LDL is less than 100.  "Good" cholesterol (HDL cholesterol). This type helps to protect against heart disease by cleaning the arteries and carrying the LDL away. The desired level for HDL is 60 or higher.  Triglycerides. These are fats that the body can store or burn for energy. The desired number for triglycerides is lower than 150.  Total cholesterol. This is a measure of the total amount of cholesterol in your blood, including LDL cholesterol, HDL cholesterol, and triglycerides. A healthy number is less than 200. How is this treated? This condition is treated with diet changes, lifestyle changes, and medicines. Diet changes  This may include eating more whole grains, fruits, vegetables, nuts, and fish.  This may also include cutting back on red meat and foods that have a lot of added sugar. Lifestyle changes  Changes may include getting at least 40 minutes of aerobic exercise 3 times a week. Aerobic exercises include walking, biking, and swimming. Aerobic exercise along with a healthy diet can help you maintain a healthy weight.  Changes may also include quitting smoking. Medicines  Medicines are usually given if diet and lifestyle changes have failed to reduce your cholesterol to healthy levels.  Your health care provider may prescribe a statin medicine. Statin medicines have been shown to reduce cholesterol, which can reduce the risk of heart disease. Follow these instructions at home: Eating and drinking If told by your health care provider:  Eat chicken (without skin), fish, veal, shellfish, ground [place] breast, and round or loin cuts of red meat.  Do not eat fried foods or fatty meats, such as hot  dogs and salami.  Eat plenty of fruits, such as apples.  Eat plenty of vegetables, such as broccoli, potatoes, and carrots.  Eat beans, peas, and lentils.  Eat grains such as barley, rice, couscous, and bulgur wheat.  Eat pasta without cream sauces.  Use skim or nonfat milk, and eat low-fat or nonfat yogurt and cheeses.  Do not eat or drink whole milk, cream, ice cream, egg yolks, or hard cheeses.  Do not eat stick margarine or tub margarines that contain trans fats (also called partially hydrogenated oils).  Do not eat saturated tropical oils, such as coconut oil and palm oil.  Do not eat cakes, cookies, crackers, or other baked goods that contain trans fats.  General instructions  Exercise as directed by your health care provider. Increase your activity level with activities such as gardening, walking, and taking the stairs.  Take over-the-counter and prescription medicines only as told by your health care provider.  Do not use any products that contain nicotine or tobacco, such as cigarettes and e-cigarettes. If you need help quitting, ask your health care provider.  Keep all follow-up visits as told by your health care provider. This is important.  Contact a health care provider if:  You are struggling to maintain a healthy diet or weight.  You need help to start on an exercise program.  You need help to stop smoking. Get help right away if:  You have chest pain.  You have trouble breathing. This information is not intended to replace advice given to you by your health care provider. Make sure you discuss any questions you have with your health care provider. Document Revised: 10/12/2017 Document Reviewed: 04/08/2016 Elsevier Patient Education  2020 [employer].

## 2020-02-23 ENCOUNTER — Ambulatory Visit: Payer: Self-pay

## 2020-02-23 NOTE — Chronic Care Management (AMB) (Signed)
During initial CCM visit, patient expressed interest from [facility] services to help with medication management and adherence packing.   Verbal consent obtained for [facility] enhanced pharmacy services (medication synchronization, adherence packaging, delivery coordination). A medication sync plan was created to allow patient to get all medications delivered once every 30 to 90 days per patient preference. Patient understands they have freedom to choose pharmacy and clinical pharmacist will coordinate care between all prescribers and [facility].  BP Readings from Last 3 Encounters:  02/09/20 124/83  01/20/20 113/75  01/19/20 120/63    Lab Results  Component Value Date   HGBA1C 5.7 (A) 01/07/2020     Patient requested medications through Adherence Packaging  90 Days   Sync date: 04/25/2020  Coordinated delivery dates for patient's medications:    Sodium chloride nebulizer 3% 03/09/20  Centrum  03/09/20  Cetirizine 10mg  03/09/20  Montelukast 10 mg 04/25/20  Linzess 145 mcg 05/12/20 PRN (Patient instructed to call when needed)  Acetaminophen 500 mg PRN (Will call back when needed)  Famotidine 40 mg Will call back when needed  Aspirin 81 mg Has enough. Will call when needed  Amlodipine 5 mg  03/24/20  Lamotrigine 25 mg 03/25/20 (pending course may change as patient is not adherent due to side effects)  Losartan 25 mg 03/04/20  Nitroglycerin 2% (Nitro-bid) ointment PRN (Patient instructed to call when needed) PRN (Patient instructed to call when needed)    Diclofenac 1% gel PRN (Patient instructed to call when needed)  Ipratropium/albuterol 0.5mg /2.5mg / 3 ML nebulizer solution PRN (Patient instructed to call when needed)  Refresh OP PRN (Patient instructed to call when needed)  Refresh Repair gel  PRN (Patient instructed to call when needed)  Trazodone 50 mg PRN (Patient instructed to call when needed)  Triamcinolone 0.1% cream PRN (Patient instructed to call when  needed)  Duloxetine 30 mg capsule Pending- therapy may change; patient following up with provider  Patient instructed to call when needed  Accu-check Smartview Test strips  Patient instructed to call when needed; has not been checking as indicated  Neosporin PRN (Patient instructed to call when needed)  Ondansetron ODT 4 mg PRN (Patient instructed to call when needed  Anoro Ellipta Pending PA approval (patient received sample from pulm)    Patient needs refills for:  03/06/20, MD  Amlodipine 5 mg   Losartan 25 mg  Accu-check Smartview Test strips   Berniece Andreas  Sodium chloride nebulizer 3%  Montelukast 10 mg  Ipratropium/albuterol 0.5mg /2.5mg / 3 ML nebulizer solution  Anoro Ellipta  Jyothi Mann  Linzess 145 mcg  Famotidine 40 mg  Ondansetron ODT 4 mg  Karie Fetch   Lamotrigine 25 mg  Trazodone 50 mg  Duloxetine 30 mg capsule  Shaili Devenshar   Nitroglycerin 2% (Nitro-bid) ointment  Diclofenac 1% gel  John McComb  Triamcinolone 0.1% cream  OTC  Centrum Silver 50+ Women's  Cetrizine 10mg    Acetaminophen 500 mg  Aspirin 81 mg  Refresh OP  Refresh Repair gel   Neosporin   Advised patient that pharmacy will contact them the morning of delivery or day before.   Melony Overly, PharmD Clinical Pharmacist [facility] [phone]

## 2020-02-24 ENCOUNTER — Other Ambulatory Visit: Payer: Self-pay

## 2020-02-24 ENCOUNTER — Ambulatory Visit: Payer: Medicare HMO | Admitting: Physical Therapy

## 2020-02-24 ENCOUNTER — Encounter: Payer: Self-pay | Admitting: Physical Therapy

## 2020-02-24 DIAGNOSIS — M546 Pain in thoracic spine: Secondary | ICD-10-CM

## 2020-02-24 DIAGNOSIS — M545 Low back pain: Secondary | ICD-10-CM | POA: Diagnosis not present

## 2020-02-24 DIAGNOSIS — G8929 Other chronic pain: Secondary | ICD-10-CM | POA: Diagnosis not present

## 2020-02-24 DIAGNOSIS — R2689 Other abnormalities of gait and mobility: Secondary | ICD-10-CM

## 2020-02-24 DIAGNOSIS — M6281 Muscle weakness (generalized): Secondary | ICD-10-CM | POA: Diagnosis not present

## 2020-02-24 DIAGNOSIS — M542 Cervicalgia: Secondary | ICD-10-CM | POA: Diagnosis not present
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
## 2020-02-25 DIAGNOSIS — H47013 Ischemic optic neuropathy, bilateral: Secondary | ICD-10-CM | POA: Diagnosis not present

## 2020-02-25 DIAGNOSIS — H532 Diplopia: Secondary | ICD-10-CM | POA: Diagnosis not present

## 2020-02-25 DIAGNOSIS — H40021 Open angle with borderline findings, high risk, right eye: Secondary | ICD-10-CM | POA: Diagnosis not present

## 2020-02-25 DIAGNOSIS — R260 Ataxic gait: Secondary | ICD-10-CM | POA: Diagnosis not present

## 2020-02-25 DIAGNOSIS — E119 Type 2 diabetes mellitus without complications: Secondary | ICD-10-CM | POA: Diagnosis not present

## 2020-02-25 DIAGNOSIS — H02889 Meibomian gland dysfunction of unspecified eye, unspecified eyelid: Secondary | ICD-10-CM | POA: Diagnosis not present

## 2020-02-25 DIAGNOSIS — H25013 Cortical age-related cataract, bilateral: Secondary | ICD-10-CM | POA: Diagnosis not present
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
## 2020-02-26 ENCOUNTER — Encounter: Payer: Medicare HMO | Admitting: Rehabilitative and Restorative Service Providers"

## 2020-02-26 ENCOUNTER — Telehealth: Payer: Self-pay | Admitting: Rehabilitative and Restorative Service Providers"

## 2020-02-26 NOTE — Telephone Encounter (Signed)
Called and left voice message after no show 15 mins for appointment.  Reminded about next appointment on May 11  Chyrel Masson, PT, DPT, OCS, ATC 02/26/20  2:02 PM

## 2020-02-27 ENCOUNTER — Other Ambulatory Visit: Payer: Self-pay

## 2020-02-27 ENCOUNTER — Telehealth: Payer: Self-pay

## 2020-02-27 ENCOUNTER — Telehealth: Payer: Self-pay | Admitting: Critical Care Medicine

## 2020-02-27 MED ORDER — GLUCOSE BLOOD VI STRP: ORAL_STRIP | 1 refills | Status: DC

## 2020-02-27 NOTE — Chronic Care Management (AMB) (Signed)
Was contacted by [facility], prior authorization for Anoro Ellipta was approved with a copay of: $95/ 30 day supply.   Contacted [employer] to assess prior authorization approval/ tier exception. for Anoro.   Was told Anoro prior authorization appeal was approved and a tier exception can be submitted. Tier exception will need to be completed by provider's office (Dr. Karie Fetch). Will communicate with office to complete tier exception by calling: [phone].    Laural Benes, PharmD Clinical Pharmacist [facility] [place] [phone]

## 2020-02-27 NOTE — Telephone Encounter (Signed)
Patient consented to [facility] pharmacy services. I am working with patient to transition into adherence packaging. Process has been discussed with Dr. Fabian Sharp.   I am requested refills for 90 day supply be sent to [facility].   Amlodipine  Losartan  Accu-chek Nano test strips.    Thanks!

## 2020-02-27 NOTE — Chronic Care Management (AMB) (Signed)
Received call from patient. Patient cleared patient's questions on care plan.  Patient also reported she will be getting eye drops on her own. - Refresh Relivia eye drops - Refresh gel   She was also started on Azasite eye drops for eye infection. Patient is waiting for reassessment if she will be needed to refill it or not.   Clarified pending items from last call regarding coordination of medications. At last CCM visit, patient reported not taking medications as indicated by Melony Overly due to intolerance. Patient stated she has a visit scheduled on 03/01/20 (Monday) with Dr. Claybon Jabs and will be calling back to confirm any medication changes.

## 2020-02-27 NOTE — Telephone Encounter (Signed)
Good morning, I have sent in a 90 day refill for her Accuchek Nano test strips.   Patient should have already received a 90 day refill sent on 02/17/2020 for Amlodipine and a 90 day refill sent on 02/16/2020 for Losartan from [facility]. Do you know if she has already picked these up? Not sure if I should send so soon if she already has these prescriptions?

## 2020-02-27 NOTE — Telephone Encounter (Signed)
Thank you Aundra Millet! I will double check if patient picked up those prescriptions from [facility].  I will let [facility] know that new prescriptions were sent to [facility] and request those transfers if not picked up from patient.   Thanks for your help!

## 2020-02-27 NOTE — Telephone Encounter (Signed)
I called [employer] to check the status of re determination of Anoro  Med approved  Copay $ 95 Tier 4  Approved until 10/22/2020   Jacelyn Grip and was placed on hold for 10 min  WCB

## 2020-03-01 ENCOUNTER — Telehealth: Payer: Self-pay | Admitting: Physician Assistant

## 2020-03-02 ENCOUNTER — Other Ambulatory Visit: Payer: Self-pay

## 2020-03-02 ENCOUNTER — Ambulatory Visit (INDEPENDENT_AMBULATORY_CARE_PROVIDER_SITE_OTHER): Payer: Medicare HMO | Admitting: Physical Therapy

## 2020-03-02 ENCOUNTER — Ambulatory Visit: Payer: Medicare HMO | Admitting: Physician Assistant

## 2020-03-02 ENCOUNTER — Encounter: Payer: Self-pay | Admitting: Physical Therapy

## 2020-03-02 DIAGNOSIS — M545 Low back pain, unspecified: Secondary | ICD-10-CM

## 2020-03-02 DIAGNOSIS — G8929 Other chronic pain: Secondary | ICD-10-CM | POA: Diagnosis not present

## 2020-03-02 DIAGNOSIS — R2689 Other abnormalities of gait and mobility: Secondary | ICD-10-CM

## 2020-03-02 DIAGNOSIS — M546 Pain in thoracic spine: Secondary | ICD-10-CM

## 2020-03-02 DIAGNOSIS — M6281 Muscle weakness (generalized): Secondary | ICD-10-CM | POA: Diagnosis not present

## 2020-03-02 DIAGNOSIS — M542 Cervicalgia: Secondary | ICD-10-CM | POA: Diagnosis not present

## 2020-03-02 NOTE — Telephone Encounter (Signed)
Closing encounter.   Patient received 90 day supply of amlodipine and losartan from [facility] on 02/16/2020. Coordinated information with [facility] pharmacy.

## 2020-03-02 NOTE — Chronic Care Management (AMB) (Signed)
Received message from [facility]. Several prescriptions still needed from patient's profile.  Called [facility] to see if they had prescriptions on hold or assess recent fill for amlodipine and losartan. Pharmacy is closed and does not open until 9 am.  Also called Dr. Daryll Brod office for following on tier exception for Anoro inhaler. Left message for a call back to [phone].

## 2020-03-02 NOTE — Telephone Encounter (Signed)
Desiree [name] is returning phone call. Drinda Butts phone number is [phone].

## 2020-03-02 NOTE — Telephone Encounter (Signed)
Called and spoke with Drinda Butts about the info stated by Verlon Au. She stated that the copay of $95 was still too much for pt so they were wanting to know if we could call [employer] to see if they could do a tier exception to hopefully get the copay lower than that for pt.  [facility] and spoke with [place] to see if we could do a tier exception for pt's Anoro Ellipta. The tier exception was initiated. He stated that it could take up to 72 hours to receive response to see if this has been approved for pt. Reference number for this is: [id number]. To check status, number that needs to be called is [phone].
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
## 2020-03-02 NOTE — Chronic Care Management (AMB) (Signed)
Designer, fashion/clothing. Was told by pharmacy staff, patient picked up amlodipine and losartan for 90 day supply on 02/16/2020.   Coordinated information to [facility] to avoid duplicate fills and coordinate delivery for next fill.
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
## 2020-03-03 ENCOUNTER — Encounter: Payer: Self-pay | Admitting: Rheumatology

## 2020-03-03 ENCOUNTER — Ambulatory Visit: Payer: Medicare HMO | Admitting: Rheumatology

## 2020-03-03 VITALS — BP 110/69 | HR 112 | Resp 16 | Ht 62.5 in | Wt 139.6 lb

## 2020-03-03 DIAGNOSIS — M5136 Other intervertebral disc degeneration, lumbar region: Secondary | ICD-10-CM | POA: Diagnosis not present

## 2020-03-03 DIAGNOSIS — M5030 Other cervical disc degeneration, unspecified cervical region: Secondary | ICD-10-CM | POA: Diagnosis not present

## 2020-03-03 DIAGNOSIS — M19071 Primary osteoarthritis, right ankle and foot: Secondary | ICD-10-CM | POA: Diagnosis not present

## 2020-03-03 DIAGNOSIS — Z8639 Personal history of other endocrine, nutritional and metabolic disease: Secondary | ICD-10-CM

## 2020-03-03 DIAGNOSIS — Z79899 Other long term (current) drug therapy: Secondary | ICD-10-CM

## 2020-03-03 DIAGNOSIS — M19041 Primary osteoarthritis, right hand: Secondary | ICD-10-CM | POA: Diagnosis not present

## 2020-03-03 DIAGNOSIS — M51369 Other intervertebral disc degeneration, lumbar region without mention of lumbar back pain or lower extremity pain: Secondary | ICD-10-CM

## 2020-03-03 DIAGNOSIS — R296 Repeated falls: Secondary | ICD-10-CM

## 2020-03-03 DIAGNOSIS — M170 Bilateral primary osteoarthritis of knee: Secondary | ICD-10-CM | POA: Diagnosis not present

## 2020-03-03 DIAGNOSIS — I10 Essential (primary) hypertension: Secondary | ICD-10-CM

## 2020-03-03 DIAGNOSIS — Z8659 Personal history of other mental and behavioral disorders: Secondary | ICD-10-CM

## 2020-03-03 DIAGNOSIS — R768 Other specified abnormal immunological findings in serum: Secondary | ICD-10-CM

## 2020-03-03 DIAGNOSIS — M19072 Primary osteoarthritis, left ankle and foot: Secondary | ICD-10-CM

## 2020-03-03 DIAGNOSIS — M19042 Primary osteoarthritis, left hand: Secondary | ICD-10-CM

## 2020-03-03 DIAGNOSIS — Z87891 Personal history of nicotine dependence: Secondary | ICD-10-CM

## 2020-03-03 DIAGNOSIS — I7301 Raynaud's syndrome with gangrene: Secondary | ICD-10-CM | POA: Diagnosis not present

## 2020-03-03 DIAGNOSIS — E782 Mixed hyperlipidemia: Secondary | ICD-10-CM

## 2020-03-03 DIAGNOSIS — M7541 Impingement syndrome of right shoulder: Secondary | ICD-10-CM | POA: Diagnosis not present

## 2020-03-03 DIAGNOSIS — J479 Bronchiectasis, uncomplicated: Secondary | ICD-10-CM

## 2020-03-03 NOTE — Telephone Encounter (Signed)
Ok, thanks.

## 2020-03-03 NOTE — Telephone Encounter (Signed)
I called her at her appt time on Monday, tried a couple of times and got no answer or VM to leave msg.  She called and spoke w/ Shanell long after her appt time.  She needs to reschedule. Will discuss her meds then. If she needs RF to get to the appt (if she's even taking the meds) we can send in 1 month supply.

## 2020-03-04 ENCOUNTER — Encounter: Payer: Self-pay | Admitting: Physical Therapy

## 2020-03-04 ENCOUNTER — Other Ambulatory Visit: Payer: Self-pay | Admitting: *Deleted

## 2020-03-04 ENCOUNTER — Telehealth: Payer: Self-pay

## 2020-03-04 ENCOUNTER — Encounter: Payer: Self-pay | Admitting: Rehabilitative and Restorative Service Providers"

## 2020-03-04 ENCOUNTER — Ambulatory Visit (INDEPENDENT_AMBULATORY_CARE_PROVIDER_SITE_OTHER): Payer: Medicare HMO | Admitting: Physical Therapy

## 2020-03-04 ENCOUNTER — Other Ambulatory Visit: Payer: Self-pay

## 2020-03-04 ENCOUNTER — Telehealth: Payer: Self-pay | Admitting: Critical Care Medicine

## 2020-03-04 DIAGNOSIS — M542 Cervicalgia: Secondary | ICD-10-CM

## 2020-03-04 DIAGNOSIS — R2689 Other abnormalities of gait and mobility: Secondary | ICD-10-CM

## 2020-03-04 DIAGNOSIS — M6281 Muscle weakness (generalized): Secondary | ICD-10-CM | POA: Diagnosis not present

## 2020-03-04 DIAGNOSIS — G8929 Other chronic pain: Secondary | ICD-10-CM

## 2020-03-04 DIAGNOSIS — M546 Pain in thoracic spine: Secondary | ICD-10-CM | POA: Diagnosis not present

## 2020-03-04 DIAGNOSIS — M545 Low back pain: Secondary | ICD-10-CM

## 2020-03-04 MED ORDER — ANORO ELLIPTA 62.5-25 MCG/INH IN AEPB: 1.0000 | INHALATION_SPRAY | Freq: Every day | RESPIRATORY_TRACT | 0 refills | Status: DC

## 2020-03-04 NOTE — Telephone Encounter (Signed)
Called patient to let her know prior Berkley Harvey was approved for Anoro, but the tier was denied due to its a non -formulary drug.  Patient stated she used to have Extra Help Medicare Part D, but received a letter this year that she does not qualify this year due to increased social security.   For [employer] patient assistance program, it requires patients to have spent at least $600 out-of-pocket costs on prescription medications in current calendar year. Patient stated she has not spent that much this year.

## 2020-03-04 NOTE — Telephone Encounter (Signed)
lmtcb for pt.  

## 2020-03-04 NOTE — Telephone Encounter (Signed)
Medication name and strength: Anoro PA approved/denied: APPROVED Approval dates: 10/24/2019-10/22/2020  PA #: [id number]  Tier exception- Denied- non formulary drug   Pharmacy and Drinda Butts at [facility] updated. Nothing further needed at this time.

## 2020-03-04 NOTE — Telephone Encounter (Signed)
Patient called asking about Anoro Ellipta. Patient reported being out of Anoro inhaler.   Patient was advised tier exception was initiated by Dr. Ophelia Charter office and will take up to 72 hours for approval.   Will be calling insurance to determine approval.   Patient was advised to call Dr. Ophelia Charter office and request sample in the meantime.   Patient also requested lab visit for pending blood work for lipids. Lab visit scheduled for 03/05/2020.

## 2020-03-04 NOTE — Telephone Encounter (Signed)
Samples left up front for pt. Left detailed message at pt request letting her know samples are ready for pickup.  Nothing further needed at this time- will close encounter.

## 2020-03-04 NOTE — Telephone Encounter (Signed)
Got a message from [facility] requesting prescription for sodium chloride nebulizer solution. Called Dr. Ophelia Charter office and requested prescription be sent over to [facility].
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
## 2020-03-05 ENCOUNTER — Other Ambulatory Visit: Payer: Medicare HMO

## 2020-03-05 ENCOUNTER — Encounter: Payer: Medicare HMO | Admitting: Physical Therapy

## 2020-03-05 NOTE — Telephone Encounter (Signed)
LMTCB x2 for pt 

## 2020-03-08 MED ORDER — SODIUM CHLORIDE 3 % IN NEBU: INHALATION_SOLUTION | RESPIRATORY_TRACT | 5 refills | Status: DC

## 2020-03-08 NOTE — Telephone Encounter (Signed)
Spoke with the pt's case manager, Drinda Butts  She is trying to help with cost of medication  She states that can not affod Anoro, tier exception was denied and med will cost her $90  She does not meet criteria for pt assistance bc she makes too much  Pharm team can you help with this to figure out possible cheaper alternative? thanks

## 2020-03-08 NOTE — Telephone Encounter (Signed)
Dr Chestine Spore- please advise if Stiolto would be an appropriate alternative for the anoro, thanks

## 2020-03-08 NOTE — Telephone Encounter (Signed)
Yes, that should work. Thanks!  LPC

## 2020-03-08 NOTE — Telephone Encounter (Signed)
Received call back from Dr. Ophelia Charter office.  Office is unable to provide patient long term samples for Anoro.  Patient will be referred to pharmacy team in pulmonary clinic to assist patient further for other affordable inhaler options.

## 2020-03-08 NOTE — Telephone Encounter (Signed)
LMTCB for the pt 

## 2020-03-08 NOTE — Telephone Encounter (Signed)
Anoro is non-formulary. Plan prefers Stiolto- copay $48.45. Bevespi has a $95.00 copay. Patient has Medicare and would not be eligible for copay cards.

## 2020-03-08 NOTE — Telephone Encounter (Signed)
Received VM from patient requesting call back. Returned patient's call. Patient had a question regarding cetirizine since she read on online can only be used in adults <65 years.  Discussed with patient on side effects to monitor and okay with continued use.

## 2020-03-09 ENCOUNTER — Other Ambulatory Visit: Payer: Self-pay

## 2020-03-09 ENCOUNTER — Encounter: Payer: Self-pay | Admitting: Rehabilitative and Restorative Service Providers"

## 2020-03-09 ENCOUNTER — Ambulatory Visit: Payer: Medicare HMO | Admitting: Rehabilitative and Restorative Service Providers"

## 2020-03-09 DIAGNOSIS — M545 Low back pain, unspecified: Secondary | ICD-10-CM

## 2020-03-09 DIAGNOSIS — G8929 Other chronic pain: Secondary | ICD-10-CM

## 2020-03-09 DIAGNOSIS — R2689 Other abnormalities of gait and mobility: Secondary | ICD-10-CM | POA: Diagnosis not present

## 2020-03-09 DIAGNOSIS — M6281 Muscle weakness (generalized): Secondary | ICD-10-CM | POA: Diagnosis not present

## 2020-03-09 DIAGNOSIS — M546 Pain in thoracic spine: Secondary | ICD-10-CM | POA: Diagnosis not present

## 2020-03-09 DIAGNOSIS — M542 Cervicalgia: Secondary | ICD-10-CM

## 2020-03-09 MED ORDER — STIOLTO RESPIMAT 2.5-2.5 MCG/ACT IN AERS: 2.0000 | INHALATION_SPRAY | Freq: Every day | RESPIRATORY_TRACT | 2 refills | Status: DC

## 2020-03-09 NOTE — Telephone Encounter (Signed)
Patient called back let her know the pharmacy's recommendations Patient states she didn't like the way Stiolto made her feel when she tried it before, but she would try it again. I instructed her on how to take it and to wash her mouth out. Patient voiced understanding.   [facility] per patient request Nothing further needed at this time.

## 2020-03-09 NOTE — Telephone Encounter (Signed)
Patient called requesting delivery for famotidine 40mg .   Contacted office and left message requesting prescription for famotidine 40mg  to be sent over to [facility].

## 2020-03-09 NOTE — Telephone Encounter (Signed)
Patient called to requesting famotidine delivery for tomorrow, 5/19 in the afternoon.  Contacted [facility] and coordinated delivery.

## 2020-03-09 NOTE — Telephone Encounter (Signed)
Coordinated delivery for famotidine 40mg  with [facility] for delivery 03/09/2020.

## 2020-03-09 NOTE — Telephone Encounter (Signed)
LMTCB x2  
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
## 2020-03-10 ENCOUNTER — Other Ambulatory Visit (INDEPENDENT_AMBULATORY_CARE_PROVIDER_SITE_OTHER): Payer: Medicare HMO

## 2020-03-10 DIAGNOSIS — E785 Hyperlipidemia, unspecified: Secondary | ICD-10-CM | POA: Diagnosis not present

## 2020-03-10 DIAGNOSIS — Z79899 Other long term (current) drug therapy: Secondary | ICD-10-CM

## 2020-03-10 LAB — BASIC METABOLIC PANEL
BUN: 30 mg/dL — ABNORMAL HIGH (ref 6–23)
CO2: 28 mEq/L (ref 19–32)
Calcium: 10 mg/dL (ref 8.4–10.5)
Chloride: 103 mEq/L (ref 96–112)
Creatinine, Ser: 1.45 mg/dL — ABNORMAL HIGH (ref 0.40–1.20)
GFR: 43.46 mL/min — ABNORMAL LOW (ref >60.00)
Glucose, Bld: 94 mg/dL (ref 70–99)
Potassium: 4.3 mEq/L (ref 3.5–5.1)
Sodium: 137 mEq/L (ref 135–145)

## 2020-03-10 LAB — CBC WITH DIFFERENTIAL/PLATELET
Basophils Absolute: 0.1 10*3/uL (ref 0.0–0.1)
Basophils Relative: 1 % (ref 0.0–3.0)
Eosinophils Absolute: 1.8 10*3/uL — ABNORMAL HIGH (ref 0.0–0.7)
Eosinophils Relative: 19.5 % — ABNORMAL HIGH (ref 0.0–5.0)
HCT: 38.7 % (ref 36.0–46.0)
Hemoglobin: 12.9 g/dL (ref 12.0–15.0)
Lymphocytes Relative: 29.1 % (ref 12.0–46.0)
Lymphs Abs: 2.7 10*3/uL (ref 0.7–4.0)
MCHC: 33.2 g/dL (ref 30.0–36.0)
MCV: 90.6 fl (ref 78.0–100.0)
Monocytes Absolute: 0.7 10*3/uL (ref 0.1–1.0)
Monocytes Relative: 7.6 % (ref 3.0–12.0)
Neutro Abs: 4 10*3/uL (ref 1.4–7.7)
Neutrophils Relative %: 42.8 % — ABNORMAL LOW (ref 43.0–77.0)
Platelets: 260 10*3/uL (ref 150.0–400.0)
RBC: 4.27 Mil/uL (ref 3.87–5.11)
RDW: 13.8 % (ref 11.5–15.5)
WBC: 9.3 10*3/uL (ref 4.0–10.5)

## 2020-03-10 LAB — HEPATIC FUNCTION PANEL
ALT: 16 U/L (ref 0–35)
AST: 25 U/L (ref 0–37)
Albumin: 4 g/dL (ref 3.5–5.2)
Alkaline Phosphatase: 87 U/L (ref 39–117)
Bilirubin, Direct: 0.1 mg/dL (ref 0.0–0.3)
Total Bilirubin: 0.4 mg/dL (ref 0.2–1.2)
Total Protein: 8 g/dL (ref 6.0–8.3)

## 2020-03-10 LAB — LIPID PANEL
Cholesterol: 190 mg/dL (ref 0–200)
HDL: 57.9 mg/dL (ref >39.00)
LDL Cholesterol: 114 mg/dL — ABNORMAL HIGH (ref 0–99)
NonHDL: 131.73
Total CHOL/HDL Ratio: 3
Triglycerides: 88 mg/dL (ref 0.0–149.0)
VLDL: 17.6 mg/dL (ref 0.0–40.0)

## 2020-03-11 ENCOUNTER — Other Ambulatory Visit: Payer: Self-pay

## 2020-03-11 ENCOUNTER — Encounter: Payer: Self-pay | Admitting: Rehabilitative and Restorative Service Providers"

## 2020-03-11 ENCOUNTER — Ambulatory Visit: Payer: Medicare HMO | Admitting: Rehabilitative and Restorative Service Providers"

## 2020-03-11 DIAGNOSIS — R262 Difficulty in walking, not elsewhere classified: Secondary | ICD-10-CM

## 2020-03-11 DIAGNOSIS — G8929 Other chronic pain: Secondary | ICD-10-CM | POA: Diagnosis not present

## 2020-03-11 DIAGNOSIS — M542 Cervicalgia: Secondary | ICD-10-CM | POA: Diagnosis not present

## 2020-03-11 DIAGNOSIS — M545 Low back pain: Secondary | ICD-10-CM | POA: Diagnosis not present

## 2020-03-11 DIAGNOSIS — M6281 Muscle weakness (generalized): Secondary | ICD-10-CM
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
## 2020-03-16 ENCOUNTER — Ambulatory Visit: Payer: Medicare HMO | Admitting: Orthopaedic Surgery

## 2020-03-17 NOTE — Telephone Encounter (Signed)
Received message from Desiree [name] at [facility] in regards to lamotrigine and duloxetine delivery coordination.  Contacted Desiree [name] and she stated she has not heard back from Desiree [name]'s [name] in regards to being charged no show fee. Patient stated she recently changed phone numbers and unclear if that was the reason why she did not receive call for visit.  Patient stated she is waiting callback to decide if to continue care with Desiree [name]. (depending if getting a no show fee).   Discussed importance of adherence and continuing care for chronic care.   Patient requested call to Desiree [name] on her behalf.  Called Desiree [name] [name].   Was told the following from staff:  Visit on 03/01/2020, Desiree [name] did call patient and patient did not answer.  Patient inquired on no show fee, but when discussed with Desiree [name], patient was called and did not answer so therefore will be getting a no show fee.  Currently, not posted if patient will have a no show fee.  [employer], building services out and charges them a fee, and it has not been done for this month, so unknown if patient will have a fee or not.   Was told by [name] staff, they will discuss with Desiree [name] once again since patient did call in stating she had her number changed and might have been reason why she was not reached for visit.

## 2020-03-17 NOTE — Progress Notes (Signed)
Liver normal  kidney about the same  no anemia but  your eosinophile allergy cells are elevated again  but not as high as in past  not sure if important if you are doing ok ,  forwarding  results to your specialists

## 2020-03-18 ENCOUNTER — Telehealth: Payer: Self-pay | Admitting: Internal Medicine

## 2020-03-18 NOTE — Telephone Encounter (Addendum)
Pt was returning Megan's call about results. Pt needs a call back at [phone] to leave a detailed message per pt

## 2020-03-19 ENCOUNTER — Other Ambulatory Visit: Payer: Self-pay

## 2020-03-19 ENCOUNTER — Ambulatory Visit: Payer: Medicare HMO | Admitting: Podiatry

## 2020-03-19 ENCOUNTER — Telehealth: Payer: Self-pay | Admitting: Physician Assistant

## 2020-03-19 ENCOUNTER — Telehealth: Payer: Self-pay | Admitting: Critical Care Medicine

## 2020-03-19 VITALS — Temp 97.2°F

## 2020-03-19 DIAGNOSIS — I7301 Raynaud's syndrome with gangrene: Secondary | ICD-10-CM | POA: Diagnosis not present

## 2020-03-19 DIAGNOSIS — B351 Tinea unguium: Secondary | ICD-10-CM

## 2020-03-19 DIAGNOSIS — E1169 Type 2 diabetes mellitus with other specified complication: Secondary | ICD-10-CM | POA: Diagnosis not present

## 2020-03-19 DIAGNOSIS — E1151 Type 2 diabetes mellitus with diabetic peripheral angiopathy without gangrene: Secondary | ICD-10-CM

## 2020-03-19 DIAGNOSIS — I739 Peripheral vascular disease, unspecified: Secondary | ICD-10-CM

## 2020-03-19 NOTE — Telephone Encounter (Signed)
Mrs. Tsukamoto called and wanted to schedule an appointment with Dr. Jennelle Human. I advised patient that we would have to let T. Hurst know that she wants to switch. Patient wanting to switch because she had a Telehealth appt with T. Hurst and she didn't receive a call because she had gotten a new number. But TClaybon Jabs stated she called pt but got no answer. So the patient wanted to know if it was go as a "No Show" patient was told yes it would. So she now wants to schedule with Dr. Jennelle Human.

## 2020-03-19 NOTE — Telephone Encounter (Signed)
I called the # that was in the chart and was also on my schedule for the day. It's fine with me if you see her.

## 2020-03-19 NOTE — Telephone Encounter (Signed)
Called and spoke with pt who stated she recently had labwork done by Dr. Rosezella [place] office that she wants Dr. Chestine Spore to review prior to upcoming OV with her on 6/17. Stated to pt that we are able to see this in her chart in epic so Dr. Chestine Spore will be able to review this prior to OV. Pt verbalized understanding. Nothing further needed.

## 2020-03-19 NOTE — Telephone Encounter (Signed)
Received call from patient.  She let me know she is waiting for a follow up call to transition care to Dr. Jennelle Human.   Appreciated call and advised patient to let me know on medication changes to coordinate delivery in the future.

## 2020-03-19 NOTE — Telephone Encounter (Signed)
Called patient and gave her lab results. Patient verbalized an understanding. 

## 2020-03-20 NOTE — Telephone Encounter (Signed)
She can not schedule with me

## 2020-03-23 ENCOUNTER — Encounter: Payer: Self-pay | Admitting: Orthopaedic Surgery

## 2020-03-23 ENCOUNTER — Telehealth: Payer: Self-pay | Admitting: *Deleted

## 2020-03-23 ENCOUNTER — Telehealth: Payer: Self-pay | Admitting: Physician Assistant

## 2020-03-23 ENCOUNTER — Ambulatory Visit (INDEPENDENT_AMBULATORY_CARE_PROVIDER_SITE_OTHER): Payer: Medicare HMO | Admitting: Orthopaedic Surgery

## 2020-03-23 ENCOUNTER — Other Ambulatory Visit: Payer: Self-pay

## 2020-03-23 ENCOUNTER — Ambulatory Visit: Payer: Self-pay

## 2020-03-23 DIAGNOSIS — M25551 Pain in right hip: Secondary | ICD-10-CM | POA: Diagnosis not present

## 2020-03-23 DIAGNOSIS — M1611 Unilateral primary osteoarthritis, right hip: Secondary | ICD-10-CM | POA: Diagnosis not present

## 2020-03-23 DIAGNOSIS — I7301 Raynaud's syndrome with gangrene: Secondary | ICD-10-CM
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
## 2020-03-23 NOTE — Telephone Encounter (Signed)
Reviewed

## 2020-03-23 NOTE — Telephone Encounter (Signed)
Pt has made follow up appt w/ T. Hurst for 04/09/2020 @ 2:00 pm. She will be coming into the office. I let her know that Dr. Jennelle Human was not taking new patients

## 2020-03-23 NOTE — Telephone Encounter (Signed)
Prepared required form, and demographics to be faxed once 03/19/2020

## 2020-03-23 NOTE — Telephone Encounter (Signed)
Noted  

## 2020-03-24 ENCOUNTER — Ambulatory Visit: Payer: Medicare HMO | Admitting: Physical Therapy

## 2020-03-24 DIAGNOSIS — M546 Pain in thoracic spine: Secondary | ICD-10-CM

## 2020-03-24 DIAGNOSIS — M6281 Muscle weakness (generalized): Secondary | ICD-10-CM | POA: Diagnosis not present

## 2020-03-24 DIAGNOSIS — M545 Low back pain, unspecified: Secondary | ICD-10-CM

## 2020-03-24 DIAGNOSIS — M542 Cervicalgia: Secondary | ICD-10-CM

## 2020-03-24 DIAGNOSIS — R262 Difficulty in walking, not elsewhere classified: Secondary | ICD-10-CM | POA: Diagnosis not present

## 2020-03-24 DIAGNOSIS — G8929 Other chronic pain: Secondary | ICD-10-CM

## 2020-03-24 DIAGNOSIS — R2689 Other abnormalities of gait and mobility: Secondary | ICD-10-CM | POA: Diagnosis not present
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
## 2020-03-26 ENCOUNTER — Encounter: Payer: Medicare HMO | Admitting: Physical Therapy

## 2020-03-30 ENCOUNTER — Telehealth: Payer: Self-pay | Admitting: Rehabilitative and Restorative Service Providers"

## 2020-03-30 ENCOUNTER — Encounter: Payer: Medicare HMO | Admitting: Rehabilitative and Restorative Service Providers"

## 2020-03-30 NOTE — Telephone Encounter (Signed)
Pt. Called and apologized for appointment time mix up and planned to come on Thursday at 3:15  Chyrel Masson, PT, DPT, OCS, ATC 03/30/20  2:11 PM

## 2020-03-30 NOTE — Telephone Encounter (Signed)
Called Pt. And left voice message about missed appointment today.  Instructed for Pt. To call back with confirmation of plans whether to continue with next scheduled appointment.  Chyrel Masson, PT, DPT, OCS, ATC 03/30/20  2:04 PM

## 2020-04-01 ENCOUNTER — Other Ambulatory Visit: Payer: Self-pay

## 2020-04-01 ENCOUNTER — Encounter: Payer: Self-pay | Admitting: Rehabilitative and Restorative Service Providers"

## 2020-04-01 ENCOUNTER — Encounter: Payer: Medicare HMO | Admitting: Rehabilitative and Restorative Service Providers"

## 2020-04-01 ENCOUNTER — Ambulatory Visit (INDEPENDENT_AMBULATORY_CARE_PROVIDER_SITE_OTHER): Payer: Medicare HMO | Admitting: Rehabilitative and Restorative Service Providers"

## 2020-04-01 DIAGNOSIS — M6281 Muscle weakness (generalized): Secondary | ICD-10-CM

## 2020-04-01 DIAGNOSIS — M25551 Pain in right hip: Secondary | ICD-10-CM | POA: Diagnosis not present

## 2020-04-01 DIAGNOSIS — G8929 Other chronic pain: Secondary | ICD-10-CM

## 2020-04-01 DIAGNOSIS — M545 Low back pain, unspecified: Secondary | ICD-10-CM

## 2020-04-01 DIAGNOSIS — R262 Difficulty in walking, not elsewhere classified: Secondary | ICD-10-CM

## 2020-04-01 DIAGNOSIS — R2689 Other abnormalities of gait and mobility: Secondary | ICD-10-CM | POA: Diagnosis not present

## 2020-04-01 DIAGNOSIS — M546 Pain in thoracic spine: Secondary | ICD-10-CM

## 2020-04-01 DIAGNOSIS — M542 Cervicalgia: Secondary | ICD-10-CM | POA: Diagnosis not present
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
## 2020-04-02 ENCOUNTER — Telehealth: Payer: Self-pay | Admitting: *Deleted

## 2020-04-02 NOTE — Telephone Encounter (Signed)
Darl Pikes w/ [facility] is needing clarification on the previous order sent  for patient(ultrasound). On the order it stated that you are requesting  B/L ABI lower extremities Raynauds, Gangrene but the  Raynauds would not qualify, needs to be more detail and precise.  Please call at [phone].

## 2020-04-05 ENCOUNTER — Ambulatory Visit (INDEPENDENT_AMBULATORY_CARE_PROVIDER_SITE_OTHER)
Admission: RE | Admit: 2020-04-05 | Discharge: 2020-04-05 | Disposition: A | Discharge status: Home / Self Care | Payer: Medicare HMO | Source: Ambulatory Visit | Attending: Podiatry | Admitting: Podiatry

## 2020-04-05 ENCOUNTER — Other Ambulatory Visit: Payer: Self-pay

## 2020-04-05 ENCOUNTER — Ambulatory Visit (HOSPITAL_COMMUNITY)
Admission: RE | Admit: 2020-04-05 | Discharge: 2020-04-05 | Disposition: A | Discharge status: Home / Self Care | Payer: Medicare HMO | Source: Ambulatory Visit | Attending: Podiatry | Admitting: Podiatry

## 2020-04-05 DIAGNOSIS — I7301 Raynaud's syndrome with gangrene: Secondary | ICD-10-CM

## 2020-04-06 ENCOUNTER — Encounter: Payer: Medicare HMO | Admitting: Rehabilitative and Restorative Service Providers"

## 2020-04-08 ENCOUNTER — Other Ambulatory Visit: Payer: Self-pay

## 2020-04-08 ENCOUNTER — Encounter: Payer: Self-pay | Admitting: Critical Care Medicine

## 2020-04-08 ENCOUNTER — Ambulatory Visit: Payer: Medicare HMO | Admitting: Critical Care Medicine

## 2020-04-08 VITALS — BP 138/76 | HR 102

## 2020-04-08 DIAGNOSIS — J479 Bronchiectasis, uncomplicated: Secondary | ICD-10-CM

## 2020-04-08 DIAGNOSIS — J449 Chronic obstructive pulmonary disease, unspecified: Secondary | ICD-10-CM

## 2020-04-08 MED ORDER — AZELASTINE HCL 0.1 % NA SOLN: 2.0000 | Freq: Two times a day (BID) | NASAL | 12 refills | Status: DC

## 2020-04-08 NOTE — Patient Instructions (Addendum)
Thank you for visiting Dr. Chestine Spore at [facility]. We recommend the following:  Stay on singulair, zyrtec, and flonase once daily. Add 1 hypertonic saline nebulizer with either vest or flutter valve at night before your Anoro. Start new nasal spray 2 times daily- azelastine.  Meds ordered this encounter  Medications  . azelastine (ASTELIN) 0.1 % nasal spray    Sig: Place 2 sprays into both nostrils 2 (two) times daily. Use in each nostril as directed    Dispense:  30 mL    Refill:  12     If you want to use your nebulizer with hypertonic saline for 15 minutes that is fine.   Return in about 4 months (around 08/08/2020).    Please do your part to reduce the spread of COVID-19.
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
## 2020-04-09 ENCOUNTER — Ambulatory Visit: Payer: Medicare HMO | Admitting: Physician Assistant

## 2020-04-09 ENCOUNTER — Other Ambulatory Visit: Payer: Self-pay

## 2020-04-09 ENCOUNTER — Telehealth: Payer: Self-pay | Admitting: Internal Medicine

## 2020-04-09 NOTE — Telephone Encounter (Signed)
Patient gets this prescription from a different provider and a note has been attached to the medication that she is no longer taking.

## 2020-04-09 NOTE — Telephone Encounter (Signed)
Pt stated she is no longer taking the methocarbamol anymore and would like it removed from her medication list if possible.

## 2020-04-11 NOTE — Telephone Encounter (Signed)
Can we just do PAD and claudication?

## 2020-04-11 NOTE — Addendum Note (Signed)
Addended by: Ventura Sellers on: 04/11/2020 09:50 PM   Modules accepted: Level of Service

## 2020-04-11 NOTE — Progress Notes (Addendum)
  Subjective:  [name], female    DOB: 1950/11/27,  MRN: [id number]  Chief Complaint  Patient presents with  . Nail Problem    Thick, long toenails. Oncychomycosis.  . Diabetes    HgbA1c in March 2021 = 5.7. Pt has been scheduling pedicures.  . Skin Problem    Bilateral 2nd toes. Pt stated, "The lady who did my pedicure pulled some skin off of these spots. They're sore. No drainage/bleeding".    69 y.o. female presents with the above complaint. History confirmed with patient.   Objective:  Physical Exam: warm, good capillary refill, nail exam onychomycosis of the toenails, no trophic changes or ulcerative lesions, normal DP and PT pulses, and absent protective sensation Left Foot: normal exam, no swelling, tenderness, instability; ligaments intact, full range of motion of all ankle/foot joints  Right Foot: normal exam, no swelling, tenderness, instability; ligaments intact, full range of motion of all ankle/foot joints   No images are attached to the encounter.  Assessment:   1. Onychomycosis of multiple toenails with type 2 diabetes mellitus and peripheral angiopathy (HCC)   2. Raynaud's phenomenon with gangrene (HCC)   3. PAD (peripheral artery disease) (HCC)   4. Claudication (HCC)    Plan:  Patient was evaluated and treated and all questions answered.  Onychomycosis, Diabetes and DPN -Patient is diabetic with a qualifying condition for at risk foot care.  Procedure: Nail Debridement Rationale: Patient meets criteria for routine foot care due to DPN Type of Debridement: manual, sharp debridement. Instrumentation: Nail nipper, rotary burr. Number of Nails: 10  PAD, Raynauds -Order non-invasive vascular studies.    No follow-ups on file.

## 2020-04-12 NOTE — Telephone Encounter (Signed)
Left message informing pt of Dr. Samuella Cota recommended dx of PAD, claudication.

## 2020-04-13 ENCOUNTER — Ambulatory Visit: Payer: Medicare HMO | Admitting: Orthopaedic Surgery

## 2020-04-13 ENCOUNTER — Encounter: Payer: Medicare HMO | Admitting: Rehabilitative and Restorative Service Providers"

## 2020-04-13 NOTE — Telephone Encounter (Signed)
Faxed required form, and clinicals with demographics to [facility].

## 2020-04-14 DIAGNOSIS — H40021 Open angle with borderline findings, high risk, right eye: Secondary | ICD-10-CM | POA: Diagnosis not present

## 2020-04-14 DIAGNOSIS — E119 Type 2 diabetes mellitus without complications: Secondary | ICD-10-CM | POA: Diagnosis not present

## 2020-04-14 DIAGNOSIS — H47013 Ischemic optic neuropathy, bilateral: Secondary | ICD-10-CM | POA: Diagnosis not present

## 2020-04-14 DIAGNOSIS — R260 Ataxic gait: Secondary | ICD-10-CM | POA: Diagnosis not present

## 2020-04-14 DIAGNOSIS — H04123 Dry eye syndrome of bilateral lacrimal glands: Secondary | ICD-10-CM | POA: Diagnosis not present

## 2020-04-14 DIAGNOSIS — H25013 Cortical age-related cataract, bilateral: Secondary | ICD-10-CM | POA: Diagnosis not present

## 2020-04-14 DIAGNOSIS — H532 Diplopia: Secondary | ICD-10-CM | POA: Diagnosis not present

## 2020-04-20 ENCOUNTER — Other Ambulatory Visit: Payer: Self-pay | Admitting: Physician Assistant

## 2020-04-20 NOTE — Telephone Encounter (Signed)
Looks like this was discontinued by another provider? 

## 2020-04-21 ENCOUNTER — Encounter: Payer: Medicare HMO | Admitting: Rehabilitative and Restorative Service Providers"

## 2020-04-21 NOTE — Telephone Encounter (Signed)
You do not need to do anything Desiree [name], I am just putting this in the chart for our information.  According to the record, it looks like the patient reported to Dr. Corliss [name] that she was not taking it, and then it was removed from the med list.  But I am not sure.  At any rate, I am denying this.  Desiree [name] was supposed to have had an appointment 04/09/2020, I am not sure why she did not keep that.  I know she has an appointment on 05/06/2020 and I will discuss all of her medications and feel as appropriate at that time.  Thanks.

## 2020-04-22 ENCOUNTER — Ambulatory Visit: Payer: Self-pay

## 2020-04-22 ENCOUNTER — Other Ambulatory Visit: Payer: Self-pay

## 2020-04-22 ENCOUNTER — Ambulatory Visit
Admission: RE | Admit: 2020-04-22 | Discharge: 2020-04-22 | Disposition: A | Discharge status: Home / Self Care | Payer: Medicare HMO | Source: Ambulatory Visit | Attending: Surgery | Admitting: Surgery

## 2020-04-22 ENCOUNTER — Ambulatory Visit: Payer: Medicare HMO | Admitting: Podiatry

## 2020-04-22 DIAGNOSIS — I7301 Raynaud's syndrome with gangrene: Secondary | ICD-10-CM

## 2020-04-22 DIAGNOSIS — M25551 Pain in right hip: Secondary | ICD-10-CM | POA: Diagnosis not present

## 2020-04-22 DIAGNOSIS — I739 Peripheral vascular disease, unspecified: Secondary | ICD-10-CM

## 2020-04-22 NOTE — Chronic Care Management (AMB) (Signed)
Received message from [facility] about coordination delivery for amlodipine and lamotrigine.   Per dispense report, patient received amlodipine 02/17/20 for 90DS. Will not need amlodipine until 05/18/2020.  And patient is pending visits with behavioral health for management for lamotrigine since patient was not tolerating.   Called patient to clarify medications. Patient busy at the moment and request call back later today.

## 2020-04-22 NOTE — Progress Notes (Signed)
  Subjective:  Patient ID: KANOELANI DOBIES, female    DOB: November 25, 1950,  MRN: [id number]  Chief Complaint  Patient presents with  . Poor Circulation    Discuss circulatory issues. No new concerns.   69 y.o. female presents with the above complaint. History confirmed with patient.   Objective:  Physical Exam: warm, good capillary refill, nail exam onychomycosis of the toenails, no trophic changes or ulcerative lesions, normal DP and PT pulses, and absent protective sensation Left Foot: normal exam, no swelling, tenderness, instability; ligaments intact, full range of motion of all ankle/foot joints  Right Foot: normal exam, no swelling, tenderness, instability; ligaments intact, full range of motion of all ankle/foot joints   No images are attached to the encounter.  Assessment:   1. Raynaud's phenomenon with gangrene (HCC)   2. Claudication (HCC)    Plan:  Patient was evaluated and treated and all questions answered.  F/u PAD -No abnormalities suggested on circulation tests -Should she wish to have toe surgery she would be acceptable risk from a vascular perspective -All questions answered. -F/u PRN  No follow-ups on file.

## 2020-04-27 ENCOUNTER — Encounter: Payer: Self-pay | Admitting: Orthopaedic Surgery

## 2020-04-27 ENCOUNTER — Ambulatory Visit: Payer: Medicare HMO | Admitting: Orthopaedic Surgery

## 2020-04-27 DIAGNOSIS — M1611 Unilateral primary osteoarthritis, right hip: Secondary | ICD-10-CM | POA: Insufficient documentation
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
## 2020-04-28 ENCOUNTER — Telehealth: Payer: Self-pay | Admitting: Critical Care Medicine

## 2020-04-28 ENCOUNTER — Telehealth: Payer: Self-pay | Admitting: Orthopaedic Surgery

## 2020-04-28 MED ORDER — STIOLTO RESPIMAT 2.5-2.5 MCG/ACT IN AERS: 2.0000 | INHALATION_SPRAY | Freq: Every day | RESPIRATORY_TRACT | 11 refills | Status: DC

## 2020-04-28 NOTE — Telephone Encounter (Signed)
Thank you. If she needs refills, please give her 4 so that she will be set for a year. I will still see her in follow up before that.  Steffanie Dunn, DO 04/28/20 3:53 PM [facility]

## 2020-04-28 NOTE — Telephone Encounter (Signed)
Patient wanted to advise Dr. Chestine Spore that the [facility] was more affordable for her verus the [facility] so she would like to continue on the [employer]. Dr. Chestine Spore please advise.

## 2020-04-28 NOTE — Telephone Encounter (Signed)
Patient has been attending PT for neck, back, and hip pain. She is now going to be scheduled for total hip arthroplasty. She did not know if she should continue PT since she is having surgery. She states that it may have helped her neck a little, but not a lot.  Please advise.

## 2020-04-28 NOTE — Telephone Encounter (Signed)
Pt called asking for a CB regarding her surgery and PT appts.   [phone]

## 2020-04-29 ENCOUNTER — Telehealth: Payer: Self-pay | Admitting: Critical Care Medicine

## 2020-04-29 MED ORDER — STIOLTO RESPIMAT 2.5-2.5 MCG/ACT IN AERS: 2.0000 | INHALATION_SPRAY | Freq: Every day | RESPIRATORY_TRACT | 5 refills | Status: DC

## 2020-04-29 NOTE — Telephone Encounter (Signed)
Her choice thanks

## 2020-04-29 NOTE — Telephone Encounter (Signed)
Called patient and sent refill for Stiolto to preferred pharmacy.

## 2020-04-30 ENCOUNTER — Encounter: Payer: Medicare HMO | Admitting: Rehabilitative and Restorative Service Providers"

## 2020-04-30 NOTE — Telephone Encounter (Signed)
I left voicemail for patient advising. 

## 2020-05-02 NOTE — Progress Notes (Signed)
Subjective:  [name], female    DOB: 1951/05/06,  MRN: [id number]  Chief Complaint  Patient presents with  . Foot Pain    pt is here for foot pain f/u, pt states that she is having foot pain, around the callous area of both feet, pt states that the injection she recieved has helped temporarily    69 y.o. female presents with the above complaint.   Review of Systems: Negative except as noted in the HPI. Denies N/V/F/Ch.  Past Medical History:  Diagnosis Date  . Acute gastritis without bleeding 08/08/2018  . Anxiety   . Bipolar disorder ([place])   . CANDIDIASIS, ESOPHAGEAL 07/28/2009   Qualifier: Diagnosis of  By: [name] MD, [name]   . Chronic kidney disease    CKD III  . Complication of anesthesia    was told she stopped breathing for one of her finger surgeries  . COPD (chronic obstructive pulmonary disease) ([place])   . Depression   . Diabetes mellitus without complication (HCC)    no meds  . FH: colonic polyps   . Fractured elbow    right   . GERD (gastroesophageal reflux disease)   . HH (hiatus hernia)   . History of carpal tunnel syndrome   . History of chest pain   . History of transfusion of packed red blood cells   . Hyperlipidemia   . Hypertension   . Neuroleptic-induced tardive dyskinesia   . Osteoarthritis of more than one site   . Seasonal allergies     Current Outpatient Medications:  .  acetaminophen (TYLENOL) 500 MG tablet, Take 2 tablets (1,000 mg total) by mouth every 6 (six) hours as needed. (Patient taking differently: Take 1,000 mg by mouth every 6 (six) hours as needed for mild pain or headache. ), Disp: 30 tablet, Rfl: 0 .  aspirin EC 81 MG tablet, Take 81 mg by mouth daily. , Disp: , Rfl:  .  Blood Glucose Monitoring Suppl (ACCU-CHEK NANO SMARTVIEW) W/DEVICE KIT, Use to check blood sugar 1-2 times daily (Patient taking differently: 1 strip by Other route 2 (two) times a week. ), Disp: 1 kit, Rfl: 0 .  cetirizine (ZYRTEC) 10 MG tablet,  Take 10 mg by mouth at bedtime. , Disp: , Rfl:  .  diclofenac sodium (VOLTAREN) 1 % GEL, APPLY 2-4 GRAMS TO AFFECTED JOINTS UP TO FOUR TIMES DAILY, Disp: 400 g, Rfl: 2 .  feeding supplement, ENSURE ENLIVE, (ENSURE ENLIVE) LIQD, Take 237 mLs by mouth 3 (three) times daily between meals. (Patient taking differently: Take 237 mLs by mouth 2 (two) times daily after a meal. ), Disp: 14 Bottle, Rfl: 0 .  fluticasone (FLONASE) 50 MCG/ACT nasal spray, Place 1-2 sprays into both nostrils at bedtime. , Disp: , Rfl:  .  ipratropium-albuterol (DUONEB) 0.5-2.5 (3) MG/3ML SOLN, Take 3 mLs by nebulization 3 (three) times daily., Disp: 360 mL, Rfl: 11 .  Lancets Misc. (ACCU-CHEK FASTCLIX LANCET) KIT, USE TO CHECK BLOOD SUGAR 1-2 TIMES DAILY Dx E11.9, Disp: 1 kit, Rfl: 1 .  linaclotide (LINZESS) 145 MCG CAPS capsule, Take 145 mcg by mouth daily as needed (constipation)., Disp: , Rfl:  .  Multiple Vitamins-Minerals (CENTRUM SILVER 50+WOMEN) TABS, Take 1 tablet by mouth daily., Disp: , Rfl:  .  NON FORMULARY, Respivest: Three times a day for 30 minutes, Disp: , Rfl:  .  ondansetron (ZOFRAN-ODT) 4 MG disintegrating tablet, DISSOLVE 1 TABLET(4 MG) ON THE TONGUE EVERY 8 HOURS AS NEEDED FOR  NAUSEA OR VOMITING, Disp: 20 tablet, Rfl: 0 .  Respiratory Therapy Supplies (FLUTTER) DEVI, 1 Device by Does not apply route as directed., Disp: 1 each, Rfl: 0 .  triamcinolone cream (KENALOG) 0.1 %, Apply 1 application topically daily as needed (for itching of affected areas). , Disp: , Rfl: 0 .  amLODipine (NORVASC) 5 MG tablet, TAKE 1 TABLET(5 MG) BY MOUTH DAILY, Disp: 90 tablet, Rfl: 0 .  azelastine (ASTELIN) 0.1 % nasal spray, Place 2 sprays into both nostrils 2 (two) times daily. Use in each nostril as directed, Disp: 30 mL, Rfl: 12 .  Carboxymethylcellul-Glycerin (REFRESH OPTIVE OP), Apply to eye daily. , Disp: , Rfl:  .  famotidine (PEPCID) 40 MG tablet, Take 40 mg by mouth daily., Disp: , Rfl:  .  glucose blood test strip,  Accu-chek nano, test glucose once daily, dx E11.9, Disp: 100 each, Rfl: 1 .  ipratropium-albuterol (DUONEB) 0.5-2.5 (3) MG/3ML SOLN, Take 3 mLs by nebulization every 4 (four) hours as needed., Disp: 360 mL, Rfl: 5 .  losartan (COZAAR) 25 MG tablet, TAKE 1 TABLET(25 MG) BY MOUTH DAILY, Disp: 90 tablet, Rfl: 0 .  meloxicam (MOBIC) 7.5 MG tablet, Take 1 tablet (7.5 mg total) by mouth daily., Disp: 7 tablet, Rfl: 0 .  methocarbamol (ROBAXIN) 500 MG tablet, Take 1 tablet (500 mg total) by mouth every 8 (eight) hours as needed for muscle spasms., Disp: 20 tablet, Rfl: 0 .  montelukast (SINGULAIR) 10 MG tablet, TAKE 1 TABLET(10 MG) BY MOUTH AT BEDTIME, Disp: 30 tablet, Rfl: 5 .  neomycin-bacitracin-polymyxin (NEOSPORIN) OINT, Neosporin (neo-bac-polym), Disp: , Rfl:  .  nitroGLYCERIN (NITRO-BID) 2 % ointment, APPLY 1/4 INCH TOPICALLY TO AFFECTED FINGERTIPS 3 TIMES A DAY, Disp: 30 g, Rfl: 0 .  sodium chloride HYPERTONIC 3 % nebulizer solution, Take by nebulization 2 (two) times daily., Disp: 750 mL, Rfl: 12 .  sodium chloride HYPERTONIC 3 % nebulizer solution, USE 1 VIAL VIA NEBULIZER TWICE A DAY., Disp: 240 mL, Rfl: 5 .  Tiotropium Bromide-Olodaterol (STIOLTO RESPIMAT) 2.5-2.5 MCG/ACT AERS, Inhale 2 puffs into the lungs daily., Disp: 4 g, Rfl: 5 .  traZODone (DESYREL) 50 MG tablet, TAKE 3 TO 4 TABLETS(150 TO 200 MG) BY MOUTH AT BEDTIME AS NEEDED, Disp: 90 tablet, Rfl: 2 .  umeclidinium-vilanterol (ANORO ELLIPTA) 62.5-25 MCG/INH AEPB, Inhale 1 puff into the lungs daily., Disp: 60 each, Rfl: 5 .  umeclidinium-vilanterol (ANORO ELLIPTA) 62.5-25 MCG/INH AEPB, Inhale 1 puff into the lungs daily., Disp: 2 each, Rfl: 0 .  UNABLE TO FIND, 3 (three) times daily. Med Name: Refresh Relivia eyedrops, Disp: , Rfl:   Social History   Tobacco Use  Smoking Status Former Smoker  . Packs/day: 2.00  . Years: 30.00  . Pack years: 60.00  . Types: Cigarettes  . Quit date: 10/23/2001  . Years since quitting: 18.5    Smokeless Tobacco Never Used    Allergies  Allergen Reactions  . Prednisone Shortness Of Breath, Itching, Nausea And Vomiting and Palpitations  . Solu-Medrol [Methylprednisolone] Anaphylaxis  . Amoxicillin Hives and Rash    Has patient had a PCN reaction causing immediate rash, facial/tongue/throat swelling, SOB or lightheadedness with hypotension:Yes Has patient had a PCN reaction causing severe rash involving mucus membranes or skin necrosis: No Has patient had a PCN reaction that required hospitalization: No Has patient had a PCN reaction occurring within the last 10 years: No If all of the above answers are "NO", then may proceed with Cephalosporin use.   Marland Kitchen  Codeine Hives, Itching and Rash    Tolerated oxycodone and morphine previously  . Hydrocodone-Acetaminophen Hives, Itching and Rash    Tolerated oxycodone and morphine previously  . Penicillins Hives, Itching and Rash    ALLERGIC REACTION TO ORAL AMOXICILLIN Has patient had a PCN reaction causing immediate rash, facial/tongue/throat swelling, SOB or lightheadedness with hypotension: Yes Has patient had a PCN reaction causing severe rash involving mucus membranes or skin necrosis: No Has patient had a PCN reaction that required hospitalization: No Has patient had a PCN reaction occurring within the last 10 years: No If all of the above answers are "NO", then may proceed with Cephalosporin use.  . Tape Other (See Comments)    sore   Objective:  There were no vitals filed for this visit. There is no height or weight on file to calculate BMI. Constitutional Well developed. Well nourished.  Vascular Dorsalis pedis pulses palpable bilaterally. Posterior tibial pulses non-palpable bilaterally. Capillary refill normal to all digits.  No cyanosis or clubbing noted. Pedal hair growth absent.  Neurologic Normal speech. Oriented to person, place, and time. Epicritic sensation to light touch grossly present bilaterally.   Dermatologic Nails elongated thickened dystrophic. No open wounds. Hyperkeratoses left sub-met 1 5 right hallux IPJ sub-met 135  Orthopedic:  Right HAV deformity with pain palpation and pain and crepitus on range of motion first MPJ hammertoe deformity second toe    Assessment:   1. Onychomycosis of multiple toenails with type 2 diabetes mellitus and peripheral angiopathy (HCC)   2. Callus   3. Diabetes mellitus type 2 with peripheral artery disease ([place])    Plan:  Patient was evaluated and treated and all questions answered.   Diabetes with PAD, Onychomycosis -Educated on diabetic footcare. Diabetic risk level 1 -Nails x10 debrided sharply and manually with large nail nipper and rotary burr.  -Calluses pared x6   Procedure: Nail Debridement Rationale: Patient meets criteria for routine foot care due to PAD Type of Debridement: manual, sharp debridement. Instrumentation: Nail nipper, rotary burr. Number of Nails: 10   Procedure: Paring of Lesion Rationale: painful hyperkeratotic lesion Type of Debridement: manual, sharp debridement. Instrumentation: 312 blade Number of Lesions: 6   Return in about 3 months (around 10/31/2019) for DM FC.

## 2020-05-03 ENCOUNTER — Telehealth: Payer: Self-pay | Admitting: *Deleted

## 2020-05-03 NOTE — Telephone Encounter (Signed)
   [facility] Pre-operative Risk Assessment    HEARTCARE STAFF: - Please ensure there is not already an duplicate clearance open for this procedure. - Under Visit Info/Reason for Call, type in Other and utilize the format Clearance MM/DD/YY or Clearance TBD. Do not use dashes or single digits. - If request is for dental extraction, please clarify the # of teeth to be extracted.  Request for surgical clearance:  1. What type of surgery is being performed? RIGHT TOTAL HIP    2. When is this surgery scheduled? TBD   3. What type of clearance is required (medical clearance vs. Pharmacy clearance to hold med vs. Both)? MEDICAL  4. Are there any medications that need to be held prior to surgery and how long? ASA    5. Practice name and name of physician performing surgery? [facility]; DR. [place]   6. What is the office phone number? [phone]   7.   What is the office fax number? [place].   Anesthesia type (None, local, MAC, general) ? SPINAL   Julaine Hua 05/03/2020, 10:36 AM  _________________________________________________________________   (provider comments below)

## 2020-05-03 NOTE — Telephone Encounter (Signed)
   Primary Cardiologist:Peter Eden Emms, MD  Chart reviewed as part of pre-operative protocol coverage. Because of Desiree [name] past medical history and time since last visit, they will require a follow-up visit in order to better assess preoperative cardiovascular risk.  Pre-op covering staff: - Please schedule appointment and call patient to inform them. If patient already had an upcoming appointment within acceptable timeframe, please add "pre-op clearance" to the appointment notes so provider is aware. - Please contact requesting surgeon's office via preferred method (i.e, phone, fax) to inform them of need for appointment prior to surgery.   [name], PA-C  05/03/2020, 4:27 PM

## 2020-05-04 NOTE — Telephone Encounter (Signed)
Appt scheduled 06/09/2020@10 :45 AM Jacolyn Reedy, PA-C. Pt will arrive early wearing a mask

## 2020-05-04 NOTE — Telephone Encounter (Signed)
Forwarded to requesting provider via EPIC fax function 

## 2020-05-05 ENCOUNTER — Encounter: Payer: Medicare HMO | Admitting: Rehabilitative and Restorative Service Providers"

## 2020-05-05 ENCOUNTER — Telehealth: Payer: Self-pay | Admitting: Orthopaedic Surgery

## 2020-05-05 NOTE — Telephone Encounter (Signed)
Called to patient to let her clearance requests have been sent to the following providers:    Dr. Berniece Andreas (PCP)  Dr. Charlton Haws  (Cardiology)  Dr. Zetta Bills  (Nephrology)  Dr. Karie Fetch  (Pulmonology)    Cardiologist has responded.  Patient is not cleared at this time for Right Total Hip since it has been awhile since last visit.  Patient is scheduled for pre-op clearance 06-09-20.  Patient is aware of this  appointment.

## 2020-05-05 NOTE — Telephone Encounter (Signed)
[place] patient. See message below.

## 2020-05-05 NOTE — Telephone Encounter (Signed)
How does this involve me?

## 2020-05-05 NOTE — Telephone Encounter (Signed)
Appt cardiology 8/18 . thanks

## 2020-05-06 ENCOUNTER — Ambulatory Visit: Payer: Medicare HMO | Admitting: Physician Assistant

## 2020-05-07 ENCOUNTER — Telehealth: Payer: Self-pay | Admitting: Physician Assistant

## 2020-05-07 NOTE — Telephone Encounter (Signed)
Noted  

## 2020-05-07 NOTE — Telephone Encounter (Signed)
Desiree [name] was sent a warning letter concerning no show appts.  It went out in the mail on 05/10/20
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
## 2020-05-11 ENCOUNTER — Ambulatory Visit (INDEPENDENT_AMBULATORY_CARE_PROVIDER_SITE_OTHER): Payer: Medicare HMO | Admitting: Internal Medicine

## 2020-05-11 ENCOUNTER — Encounter: Payer: Self-pay | Admitting: Internal Medicine

## 2020-05-11 ENCOUNTER — Other Ambulatory Visit: Payer: Self-pay

## 2020-05-11 VITALS — BP 110/76 | HR 106 | Temp 98.8°F | Ht 62.5 in | Wt 144.0 lb

## 2020-05-11 DIAGNOSIS — Z01818 Encounter for other preprocedural examination: Secondary | ICD-10-CM

## 2020-05-11 DIAGNOSIS — I10 Essential (primary) hypertension: Secondary | ICD-10-CM | POA: Diagnosis not present

## 2020-05-11 DIAGNOSIS — E1122 Type 2 diabetes mellitus with diabetic chronic kidney disease: Secondary | ICD-10-CM | POA: Diagnosis not present

## 2020-05-11 DIAGNOSIS — M1611 Unilateral primary osteoarthritis, right hip: Secondary | ICD-10-CM

## 2020-05-11 DIAGNOSIS — N183 Chronic kidney disease, stage 3 unspecified: Secondary | ICD-10-CM

## 2020-05-11 DIAGNOSIS — J449 Chronic obstructive pulmonary disease, unspecified: Secondary | ICD-10-CM | POA: Diagnosis not present

## 2020-05-11 DIAGNOSIS — J479 Bronchiectasis, uncomplicated: Secondary | ICD-10-CM | POA: Diagnosis not present

## 2020-05-11 DIAGNOSIS — I7301 Raynaud's syndrome with gangrene: Secondary | ICD-10-CM | POA: Diagnosis not present
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
## 2020-05-11 NOTE — Patient Instructions (Signed)
Will send note to Dr Ophelia Charter medical clear for Surgery .   Stable situation  .   May be use the   Saline treatments for your  lungs to optimize  Things.

## 2020-05-12 ENCOUNTER — Encounter: Payer: Self-pay | Admitting: Physician Assistant

## 2020-05-12 ENCOUNTER — Other Ambulatory Visit: Payer: Self-pay

## 2020-05-12 ENCOUNTER — Ambulatory Visit: Payer: Medicare HMO | Admitting: Physician Assistant

## 2020-05-12 ENCOUNTER — Ambulatory Visit: Payer: Medicare HMO

## 2020-05-12 VITALS — BP 108/62 | HR 84 | Ht 62.5 in | Wt 142.0 lb

## 2020-05-12 DIAGNOSIS — F3132 Bipolar disorder, current episode depressed, moderate: Secondary | ICD-10-CM

## 2020-05-12 DIAGNOSIS — J449 Chronic obstructive pulmonary disease, unspecified: Secondary | ICD-10-CM

## 2020-05-12 DIAGNOSIS — R0600 Dyspnea, unspecified: Secondary | ICD-10-CM | POA: Diagnosis not present

## 2020-05-12 DIAGNOSIS — Z01818 Encounter for other preprocedural examination: Secondary | ICD-10-CM

## 2020-05-12 DIAGNOSIS — I10 Essential (primary) hypertension: Secondary | ICD-10-CM

## 2020-05-12 DIAGNOSIS — E785 Hyperlipidemia, unspecified: Secondary | ICD-10-CM

## 2020-05-12 DIAGNOSIS — R0789 Other chest pain: Secondary | ICD-10-CM

## 2020-05-12 DIAGNOSIS — I7301 Raynaud's syndrome with gangrene: Secondary | ICD-10-CM | POA: Diagnosis not present

## 2020-05-12 DIAGNOSIS — E1122 Type 2 diabetes mellitus with diabetic chronic kidney disease: Secondary | ICD-10-CM

## 2020-05-12 DIAGNOSIS — I517 Cardiomegaly: Secondary | ICD-10-CM | POA: Diagnosis not present

## 2020-05-12 MED ORDER — AMLODIPINE BESYLATE 5 MG PO TABS: ORAL_TABLET | ORAL | 1 refills | Status: DC

## 2020-05-12 MED ORDER — LOSARTAN POTASSIUM 25 MG PO TABS: ORAL_TABLET | ORAL | 1 refills | Status: DC

## 2020-05-12 NOTE — Patient Instructions (Signed)
Medication Instructions:  Your physician recommends that you continue on your current medications as directed. Please refer to the Current Medication list given to you today.  *If you need a refill on your cardiac medications before your next appointment, please call your pharmacy*   Lab Work: None If you have labs (blood work) drawn today and your tests are completely normal, you will receive your results only by: Marland Kitchen MyChart Message (if you have MyChart) OR . A paper copy in the mail If you have any lab test that is abnormal or we need to change your treatment, we will call you to review the results.   Testing/Procedures: Your physician has requested that you have an echocardiogram. Echocardiography is a painless test that uses sound waves to create images of your heart. It provides your doctor with information about the size and shape of your heart and how well your heart's chambers and valves are working. This procedure takes approximately one hour. There are no restrictions for this procedure.     Follow-Up: At [facility], you and your health needs are our priority.  As part of our continuing mission to provide you with exceptional heart care, we have created designated Provider Care Teams.  These Care Teams include your primary Cardiologist (physician) and Advanced Practice Providers (APPs -  Physician Assistants and Nurse Practitioners) who all work together to provide you with the care you need, when you need it.  We recommend signing up for the patient portal called "MyChart".  Sign up information is provided on this After Visit Summary.  MyChart is used to connect with patients for Virtual Visits (Telemedicine).  Patients are able to view lab/test results, encounter notes, upcoming appointments, etc.  Non-urgent messages can be sent to your provider as well.   To learn more about what you can do with MyChart, go to [email or web address].    Your next appointment:    07/01/2020  The format for your next appointment:   Virtual Visit   Provider:   Charlton Haws, MD   Other Instructions None
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
## 2020-05-13 DIAGNOSIS — J342 Deviated nasal septum: Secondary | ICD-10-CM | POA: Diagnosis not present

## 2020-05-13 DIAGNOSIS — J309 Allergic rhinitis, unspecified: Secondary | ICD-10-CM | POA: Diagnosis not present

## 2020-05-14 ENCOUNTER — Other Ambulatory Visit: Payer: Self-pay | Admitting: Internal Medicine

## 2020-05-14 ENCOUNTER — Encounter: Payer: Medicare HMO | Admitting: Rehabilitative and Restorative Service Providers"

## 2020-05-22 NOTE — Patient Instructions (Signed)
Visit Information  Goals Addressed            This Visit's Progress   . Pharmacy Care Plan       CARE PLAN ENTRY  Current Barriers:  . Chronic Disease Management support, education, and care coordination needs related to Hypertension, Hyperlipidemia, Diabetes, Gastroesophageal Reflux Disease, COPD, Chronic Kidney Disease, and  Bipolar disease  Hypertension . Pharmacist Clinical Goal(s): o Over the next 90 days, patient will work with PharmD and providers to maintain BP goal < 24401 . Current regimen:   Amlodipine 5mg  1 tablet daily  Losartan 25mg  1 tablet daily . Interventions: o Discussed need to continue home blood pressure monitoring.  o Discussed importance of taking medications as directed. . Patient self care activities o Patient will check BP 1 to 2 times per week, document, and provide at future appointments  Hyperlipidemia . Pharmacist Clinical Goal(s): o Over the next 90 days, patient will work with PharmD and providers to achieve LDL goal < 70 . Current regimen:  o Currently not taking statin medications.  o For prevention: aspirin 81mg , 1 tablet once daily . Interventions: o Recommend statin medication upon confirmation of patient reported strokes. Patient self care activities o Patient to continue as is and will follow up with visits to readdress.   Diabetes . Pharmacist Clinical Goal(s): o Over the next 180 days, patient will work with PharmD and providers to maintain A1c goal < 7% . Current regimen:  o Currently not taking medications.  . Interventions: o Discussed continue with diet and exercise modifications. . Patient self care activities o Patient to continue with healthy habits (diet and exercise).  COPD . Pharmacist Clinical Goal(s) o Over the next 180 days, patient will work with PharmD and providers to obtain daily inhaler.  . Current regimen:   Stiolto Respimat, 2 puffs daily   Ipratropium- albuterol (Duoneb) 0.5-2.5 mg/ , use 1 vial  via nebulizer every 4 hours as needed   Sodium chloride 3%, use 1 vial via nebulizer twice daily  . Patient self care activities o Patient will continue to use current medications as indicated.   Raynaud's phenomenon . Pharmacist Clinical Goal(s) o Over the next 180 days, patient will continue to use medications as needed and follow up with specialist as indicated.  . Current regimen:   Nitroglycerin 2% ointment, apply to fingertips three times daily  Neosporin ointment, use as needed  . Patient self care activities o Patient will continue to use current medications as indicated.   Bipolar disorder . Pharmacist Clinical Goal(s) o Over the next 180 days, patient will work with and follow up for modifications for current therapy.  . Current regimen:   No medications . Interventions: o Discussed with patient importance of adherence. Since not tolerating medications, advised patient to follow up with provider. . Patient self care activities o Patient will contact provider and discuss medications further.   Medication management . Pharmacist Clinical Goal(s): o Over the next 30 days, patient will work with PharmD and providers to achieve optimal medication adherence . Current pharmacy: [facility] . Interventions o Comprehensive medication review performed. o Utilize [facility] for medication synchronization, packaging and delivery . Patient self care activities . Patient will take medications as prescribed . Patient will report any questions or concerns to provider   Please see past updates related to this goal by clicking on the "Past Updates" button in the selected goal         Ms.  Dethlefs was given information about Chronic Care Management services today including:  1. CCM service includes personalized support from designated clinical staff supervised by her physician, including individualized plan of care and coordination with other care  providers 2. 24/7 contact phone numbers for assistance for urgent and routine care needs. 3. Standard insurance, coinsurance, copays and deductibles apply for chronic care management only during months in which we provide at least 20 minutes of these services. Most insurances cover these services at 100%, however patients may be responsible for any copay, coinsurance and/or deductible if applicable. This service may help you avoid the need for more expensive face-to-face services. 4. Only one practitioner may furnish and bill the service in a calendar month. 5. The patient may stop CCM services at any time (effective at the end of the month) by phone call to the office staff.  Patient agreed to services and verbal consent obtained.   Print copy of patient instructions provided.  The pharmacy team will reach out to the patient again over the next 60 days.   Laural Benes, PharmD, BCACP Clinical Pharmacist [facility] [place] [phone]

## 2020-05-22 NOTE — Chronic Care Management (AMB) (Signed)
Reviewed chart for medication changes ahead. (changes and OV, consults since last care coordination call noted on office visit note).   BP Readings from Last 3 Encounters:  05/12/20 108/62  05/11/20 110/76  04/08/20 138/76    Lab Results  Component Value Date   HGBA1C 5.7 (A) 01/07/2020     Patient obtains medications through Vials  90 Days   Patient is due for next adherence delivery on: 05/13/2020.  Reviewed medications and coordinated delivery.  This delivery to include:  Montelukast 10mg , 1 tablet daily   Amlodipine- has four tabs lefts-- tomorrow  Coordinated acute fill for the following to be delivered 05/26/2020.  Famotidine 40mg , 1 tablet once daily (reports has 18 tablets left)   Patient declined the following medications due to either taking as needed or not taking as indicated:   linaclotide (Linzess) 07/26/2020- takes as needed  Ipratropium/albuterol nebulizer solution- takes as needed  Sodium chloride 3% nebulizer solution- patient reports using as needed  Multivitamin- patient reports has abundant and will call when needed  Cetrizine 10mg - will call when needed  Test strips- will call when needed  Losartan- reports has plenty and not taking as indicated  Fluticasone nasal- will call when needed  Refresh Relieva eye drops- obtained on her own  Refresh gel drops at night- obtained on her own  Patient needs refills for amlodipine 5mg  (prescribed by Dr. ).  Confirmed delivery date of 05/14/2020, advised patient that pharmacy will contact them the morning of delivery.  , PharmD, BCACP Clinical Pharmacist [facility] [place] [phone]

## 2020-05-31 ENCOUNTER — Other Ambulatory Visit: Payer: Self-pay

## 2020-05-31 ENCOUNTER — Ambulatory Visit (HOSPITAL_COMMUNITY): Payer: Medicare HMO | Attending: Cardiology

## 2020-05-31 DIAGNOSIS — R0600 Dyspnea, unspecified: Secondary | ICD-10-CM | POA: Diagnosis not present

## 2020-05-31 DIAGNOSIS — I517 Cardiomegaly: Secondary | ICD-10-CM | POA: Insufficient documentation

## 2020-05-31 LAB — ECHOCARDIOGRAM COMPLETE
Area-P 1/2: 3.97 cm2
MV M vel: 5.56 m/s
MV Peak grad: 123.4 mmHg
Radius: 0.7 cm
S' Lateral: 1.6 cm

## 2020-06-01 ENCOUNTER — Other Ambulatory Visit: Payer: Self-pay

## 2020-06-01 NOTE — Patient Outreach (Signed)
[facility]) Care Management  06/01/2020  DAESHA INSCO 12-21-1950 [id number]   Telephone call to patient for disease management follow up. No answer.  HIPAA compliant voice message left.   Plan: RN CM will attempt again in the month of November and send letter.    Bary Leriche, RN, MSN [facility] Care Management Care Management Coordinator Direct Line [phone] Cell [phone] Toll Free: [phone]  Fax: [phone]

## 2020-06-02 ENCOUNTER — Other Ambulatory Visit: Payer: Self-pay

## 2020-06-02 NOTE — Patient Outreach (Signed)
[facility]) Care Management  [place]  06/02/2020   Desiree [name] 1951-06-06 [id number]  Subjective: Return call to patient for disease management follow up.  Patient reports she is doing fine.  She states that her breathing is ok. Discussed COPD, regimen and signs of worsening and importance of notifying MD.  She verbalized understanding. Patient working to get an apartment on the first level she has applied to some senior housing and other apartments.  She states she has section 8 and some places do not take that but she continues to look.  Patient waiting to be scheduled for right hip replacement as she has constant pain.  She has received clearance from her lung doctor already. Inquired if patient had a plan after her surgery for her care.  She states she has a friend and a niece who have agreed to assist her as she does not want to go to a SNF due to [place].  Advised patient to continue to talk with friend and niece to make sure they will be able to help her post-op.  She verbalized understanding.     Objective:   Encounter Medications:  Outpatient Encounter Medications as of 06/02/2020  Medication Sig  . acetaminophen (TYLENOL) 500 MG tablet Take 2 tablets (1,000 mg total) by mouth every 6 (six) hours as needed. (Patient taking differently: Take 1,000 mg by mouth every 6 (six) hours as needed for mild pain or headache. )  . amLODipine (NORVASC) 5 MG tablet TAKE 1 TABLET(5 MG) BY MOUTH DAILY  . aspirin EC 81 MG tablet Take 81 mg by mouth daily.   Marland Kitchen azelastine (ASTELIN) 0.1 % nasal spray Place 2 sprays into both nostrils 2 (two) times daily. Use in each nostril as directed  . Blood Glucose Monitoring Suppl (ACCU-CHEK NANO SMARTVIEW) W/DEVICE KIT Use to check blood sugar 1-2 times daily (Patient taking differently: 1 strip by Other route 2 (two) times a week. )  . Carboxymethylcellul-Glycerin (REFRESH OPTIVE OP) Apply to eye daily.   . Carboxymethylcellul-Glycerin  (REFRESH RELIEVA OP) Apply to eye as directed.  . cetirizine (ZYRTEC) 10 MG tablet Take 10 mg by mouth at bedtime.   . diclofenac sodium (VOLTAREN) 1 % GEL APPLY 2-4 GRAMS TO AFFECTED JOINTS UP TO FOUR TIMES DAILY  . famotidine (PEPCID) 40 MG tablet Take 40 mg by mouth daily.  . feeding supplement, ENSURE ENLIVE, (ENSURE ENLIVE) LIQD Take 237 mLs by mouth 3 (three) times daily between meals. (Patient taking differently: Take 237 mLs by mouth 2 (two) times daily after a meal. )  . fluticasone (FLONASE) 50 MCG/ACT nasal spray Place 1-2 sprays into both nostrils at bedtime.   Marland Kitchen glucose blood test strip Accu-chek nano, test glucose once daily, dx E11.9  . ipratropium-albuterol (DUONEB) 0.5-2.5 (3) MG/3ML SOLN Take 3 mLs by nebulization 3 (three) times daily.  Marland Kitchen ipratropium-albuterol (DUONEB) 0.5-2.5 (3) MG/3ML SOLN Take 3 mLs by nebulization every 4 (four) hours as needed.  . Lancets Misc. (ACCU-CHEK FASTCLIX LANCET) KIT USE TO CHECK BLOOD SUGAR 1-2 TIMES DAILY Dx E11.9  . linaclotide (LINZESS) 145 MCG CAPS capsule Take 145 mcg by mouth daily as needed (constipation).  Marland Kitchen losartan (COZAAR) 25 MG tablet TAKE 1 TABLET(25 MG) BY MOUTH DAILY  . montelukast (SINGULAIR) 10 MG tablet TAKE 1 TABLET(10 MG) BY MOUTH AT BEDTIME  . Multiple Vitamins-Minerals (CENTRUM SILVER 50+WOMEN) TABS Take 1 tablet by mouth daily.  Marland Kitchen neomycin-bacitracin-polymyxin (NEOSPORIN) OINT Neosporin (neo-bac-polym)  . nitroGLYCERIN (NITRO-BID) 2 %  ointment APPLY 1/4 INCH TOPICALLY TO AFFECTED FINGERTIPS 3 TIMES A DAY  . NON FORMULARY Respivest: Three times a day for 30 minutes  . ondansetron (ZOFRAN-ODT) 4 MG disintegrating tablet DISSOLVE 1 TABLET(4 MG) ON THE TONGUE EVERY 8 HOURS AS NEEDED FOR NAUSEA OR VOMITING  . Respiratory Therapy Supplies (FLUTTER) DEVI 1 Device by Does not apply route as directed.  . sodium chloride HYPERTONIC 3 % nebulizer solution USE 1 VIAL VIA NEBULIZER TWICE A DAY.  Marland Kitchen Tiotropium Bromide-Olodaterol (STIOLTO  RESPIMAT) 2.5-2.5 MCG/ACT AERS Inhale 2 puffs into the lungs daily.  Marland Kitchen triamcinolone cream (KENALOG) 0.1 % Apply 1 application topically daily as needed (for itching of affected areas).   Marland Kitchen UNABLE TO FIND 3 (three) times daily. Med Name: Refresh Relivia eyedrops (Patient not taking: Reported on 05/22/2020)   No facility-administered encounter medications on file as of 06/02/2020.    Functional Status:  In your present state of health, do you have any difficulty performing the following activities: 06/02/2020 03/02/2020  Hearing? N N  Vision? N N  Difficulty concentrating or making decisions? N N  Walking or climbing stairs? N N  Dressing or bathing? N N  Doing errands, shopping? N N  Preparing Food and eating ? N N  Using the Toilet? N N  In the past six months, have you accidently leaked urine? N N  Do you have problems with loss of bowel control? N N  Managing your Medications? N N  Managing your Finances? N N  Housekeeping or managing your Housekeeping? N N  Some recent data might be hidden    Fall/Depression Screening: Fall Risk  06/02/2020 03/02/2020 06/13/2019  Falls in the past year? 0 0 1  Number falls in past yr: - - 0  Injury with Fall? - - 0  Risk for fall due to : - - History of fall(s);Impaired balance/gait;Impaired mobility  Follow up - - Falls evaluation completed;Falls prevention discussed;Education provided   [facility] 2/9 Scores 06/02/2020 03/02/2020 06/13/2019 03/12/2019 02/10/2019 08/22/2018 07/22/2018  PHQ - 2 Score 0 0 0 0 0 0 0  Exception Documentation - - - - - - -  Not completed - - - - - - -    Assessment: Patient continues to manage chronic conditions and benefits from disease management support.  Plan:  [facility] CM Care Plan Problem One     Most Recent Value  Care Plan Problem One Knowledge Deficit in Self Management of COPD  Role Documenting the Problem One Care Management Telephonic Coordinator  Care Plan for Problem One Active  [facility] Long Term Goal  Over the next  90 days, patient will demonstrate and/or verbalize understanding of self-health management for long term care of COPD.  [facility] Long Term Goal Start Date 06/02/20  Interventions for Problem One Long Term Goal REiterated with patient signs of COPD exacerbation and when to notify physician.      RN CM will contact in the month of November and patient agreeable.  Jone Baseman, RN, MSN [place] [facility] Care Management Coordinator Direct Line [phone] Cell [phone] Toll Free: [phone]  Fax: [phone]

## 2020-06-04 ENCOUNTER — Telehealth: Payer: Self-pay

## 2020-06-04 ENCOUNTER — Encounter: Payer: Self-pay | Admitting: Physician Assistant

## 2020-06-04 ENCOUNTER — Other Ambulatory Visit: Payer: Self-pay

## 2020-06-04 ENCOUNTER — Ambulatory Visit (INDEPENDENT_AMBULATORY_CARE_PROVIDER_SITE_OTHER): Payer: Medicare HMO | Admitting: Physician Assistant

## 2020-06-04 DIAGNOSIS — F32A Depression, unspecified: Secondary | ICD-10-CM

## 2020-06-04 DIAGNOSIS — E785 Hyperlipidemia, unspecified: Secondary | ICD-10-CM

## 2020-06-04 DIAGNOSIS — F329 Major depressive disorder, single episode, unspecified: Secondary | ICD-10-CM

## 2020-06-04 DIAGNOSIS — I10 Essential (primary) hypertension: Secondary | ICD-10-CM

## 2020-06-04 DIAGNOSIS — G4700 Insomnia, unspecified: Secondary | ICD-10-CM | POA: Diagnosis not present

## 2020-06-04 NOTE — Progress Notes (Signed)
[facility] Med Check  [name],  MRN: [id number]  PCP: [name], MD  Date of Evaluation: 06/04/2020 Time spent:20 minutes  Chief Complaint:  Chief Complaint    Follow-up      HISTORY/CURRENT STATUS: HPI For routine med check.  Hasn't been on Cymbalta, Lamictal, or Trazodone in months. States she stopped the Cymbalta and Lamictal because she was not sure she needed it anymore.  She sort of weaned off the Cymbalta, she said, but stopped the Lamictal cold [place].  She had no withdrawal effects.  The trazodone made her feel "drunk" and she almost fell a couple of times, so she stopped that.  "I feel a lot better off of the medications then I did when I was on them."  Says that she feels fine and is able to enjoy things.  Energy and motivation are good.  She is limited on how much she can do due to right hip pain which may require surgery soon.  She walks with a cane.  She does not isolate, she does not cry easily, appetite and weight are stable.  States her only problem is her brother gets on her nerves sometimes.  She is not sure but he may have dementia.  He does things on purpose just to aggravate her.  That is difficult to deal with sometimes but says she is making it through it all.  She sleeps pretty good most of the time.  The only problem is that the pain in her hip will wake her up sometimes in the middle of the night and then she has trouble going back to sleep.  Denies any anxiety unless provoked, like her brother can do.  Denies suicidal or homicidal thoughts.  Patient denies increased energy with decreased need for sleep, no increased talkativeness, no racing thoughts, no impulsivity or risky behaviors, no increased spending, no increased libido, no grandiosity, no increased irritability or anger, and no hallucinations.  Denies dizziness, syncope, seizures, numbness, tingling, tremor, tics, unsteady gait, slurred speech, confusion.  She has the chronic  pain in her hip, but otherwise no acute musculoskeletal problems.  Individual Medical History/ Review of Systems: Changes? :No    Past medications for mental health diagnoses include: Lamictal, trazodone caused balance problems, Cymbalta  Allergies: Prednisone, Solu-medrol [methylprednisolone], Amoxicillin, Codeine, Hydrocodone-acetaminophen, Penicillins, and Tape  Current Medications:  Current Outpatient Medications:  .  acetaminophen (TYLENOL) 500 MG tablet, Take 2 tablets (1,000 mg total) by mouth every 6 (six) hours as needed. (Patient taking differently: Take 1,000 mg by mouth every 6 (six) hours as needed for mild pain or headache. ), Disp: 30 tablet, Rfl: 0 .  amLODipine (NORVASC) 5 MG tablet, TAKE 1 TABLET(5 MG) BY MOUTH DAILY, Disp: 90 tablet, Rfl: 1 .  aspirin EC 81 MG tablet, Take 81 mg by mouth daily. , Disp: , Rfl:  .  azelastine (ASTELIN) 0.1 % nasal spray, Place 2 sprays into both nostrils 2 (two) times daily. Use in each nostril as directed, Disp: 30 mL, Rfl: 12 .  Carboxymethylcellul-Glycerin (REFRESH OPTIVE OP), Apply to eye daily. , Disp: , Rfl:  .  Carboxymethylcellul-Glycerin (REFRESH RELIEVA OP), Apply to eye as directed., Disp: , Rfl:  .  cetirizine (ZYRTEC) 10 MG tablet, Take 10 mg by mouth at bedtime. , Disp: , Rfl:  .  diclofenac sodium (VOLTAREN) 1 % GEL, APPLY 2-4 GRAMS TO AFFECTED JOINTS UP TO FOUR TIMES DAILY, Disp: 400 g, Rfl: 2 .  famotidine (PEPCID) 40  MG tablet, Take 40 mg by mouth daily., Disp: , Rfl:  .  feeding supplement, ENSURE ENLIVE, (ENSURE ENLIVE) LIQD, Take 237 mLs by mouth 3 (three) times daily between meals. (Patient taking differently: Take 237 mLs by mouth 2 (two) times daily after a meal. ), Disp: 14 Bottle, Rfl: 0 .  fluticasone (FLONASE) 50 MCG/ACT nasal spray, Place 1-2 sprays into both nostrils at bedtime. , Disp: , Rfl:  .  ipratropium-albuterol (DUONEB) 0.5-2.5 (3) MG/3ML SOLN, Take 3 mLs by nebulization 3 (three) times daily., Disp: 360  mL, Rfl: 11 .  ipratropium-albuterol (DUONEB) 0.5-2.5 (3) MG/3ML SOLN, Take 3 mLs by nebulization every 4 (four) hours as needed., Disp: 360 mL, Rfl: 5 .  linaclotide (LINZESS) 145 MCG CAPS capsule, Take 145 mcg by mouth daily as needed (constipation)., Disp: , Rfl:  .  losartan (COZAAR) 25 MG tablet, TAKE 1 TABLET(25 MG) BY MOUTH DAILY, Disp: 90 tablet, Rfl: 1 .  montelukast (SINGULAIR) 10 MG tablet, TAKE 1 TABLET(10 MG) BY MOUTH AT BEDTIME, Disp: 30 tablet, Rfl: 5 .  Multiple Vitamins-Minerals (CENTRUM SILVER 50+WOMEN) TABS, Take 1 tablet by mouth daily., Disp: , Rfl:  .  neomycin-bacitracin-polymyxin (NEOSPORIN) OINT, Neosporin (neo-bac-polym), Disp: , Rfl:  .  nitroGLYCERIN (NITRO-BID) 2 % ointment, APPLY 1/4 INCH TOPICALLY TO AFFECTED FINGERTIPS 3 TIMES A DAY, Disp: 30 g, Rfl: 0 .  NON FORMULARY, Respivest: Three times a day for 30 minutes, Disp: , Rfl:  .  ondansetron (ZOFRAN-ODT) 4 MG disintegrating tablet, DISSOLVE 1 TABLET(4 MG) ON THE TONGUE EVERY 8 HOURS AS NEEDED FOR NAUSEA OR VOMITING, Disp: 20 tablet, Rfl: 0 .  Respiratory Therapy Supplies (FLUTTER) DEVI, 1 Device by Does not apply route as directed., Disp: 1 each, Rfl: 0 .  sodium chloride HYPERTONIC 3 % nebulizer solution, USE 1 VIAL VIA NEBULIZER TWICE A DAY., Disp: 240 mL, Rfl: 5 .  Tiotropium Bromide-Olodaterol (STIOLTO RESPIMAT) 2.5-2.5 MCG/ACT AERS, Inhale 2 puffs into the lungs daily., Disp: 4 g, Rfl: 5 .  triamcinolone cream (KENALOG) 0.1 %, Apply 1 application topically daily as needed (for itching of affected areas). , Disp: , Rfl: 0 .  UNABLE TO FIND, 3 (three) times daily. Med Name: Refresh Relivia eyedrops, Disp: , Rfl:  .  Blood Glucose Monitoring Suppl (ACCU-CHEK NANO SMARTVIEW) W/DEVICE KIT, Use to check blood sugar 1-2 times daily (Patient not taking: Reported on 06/04/2020), Disp: 1 kit, Rfl: 0 .  glucose blood test strip, Accu-chek nano, test glucose once daily, dx E11.9 (Patient not taking: Reported on 06/04/2020),  Disp: 100 each, Rfl: 1 .  Lancets Misc. (ACCU-CHEK FASTCLIX LANCET) KIT, USE TO CHECK BLOOD SUGAR 1-2 TIMES DAILY Dx E11.9 (Patient not taking: Reported on 06/04/2020), Disp: 1 kit, Rfl: 1 Medication Side Effects: none  Family Medical/ Social History: Changes? No  MENTAL HEALTH EXAM:  There were no vitals taken for this visit.There is no height or weight on file to calculate BMI.  General Appearance: Casual, Neat and Well Groomed  Eye Contact:  Good  Speech:  Clear and Coherent and Normal Rate  Volume:  Normal  Mood:  Euthymic  Affect:  Appropriate  Thought Process:  Goal Directed and Descriptions of Associations: Intact  Orientation:  Full (Time, Place, and Person)  Thought Content: Logical   Suicidal Thoughts:  No  Homicidal Thoughts:  No  Memory:  WNL  Judgement:  Good  Insight:  Good  Psychomotor Activity:  walks slowly with a cane  Concentration:  Concentration: Good and Attention Span:  Good  Recall:  Good  Fund of Knowledge: Good  Language: Good  Assets:  Desire for Improvement  ADL's:  Intact  Cognition: WNL  Prognosis:  Good    DIAGNOSES:    ICD-10-CM   1. Depression, unspecified depression type  F32.9   2. Insomnia, unspecified type  G47.00     Receiving Psychotherapy: No    RECOMMENDATIONS:  PDMP was reviewed. I provided 20 minutes of face-to-face time during this encounter. Since she seems to be doing so well off of any psychiatric medications, we agreed to not restart them.  If she does start feeling depressed again, call and I will restart Cymbalta.  She knows the signs and symptoms to watch for. Return in 3 months.  Donnal Moat, PA-C

## 2020-06-04 NOTE — Progress Notes (Addendum)
Chronic Care Management Pharmacy Assistant   Name: Desiree [name]  MRN: [id number] DOB: 03-03-1951  Reason for Encounter: Medication Review   PCP : [name], MD  Allergies:   Allergies  Allergen Reactions  . Prednisone Shortness Of Breath, Itching, Nausea And Vomiting and Palpitations  . Solu-Medrol [Methylprednisolone] Anaphylaxis  . Amoxicillin Hives and Rash    Has patient had a PCN reaction causing immediate rash, facial/tongue/throat swelling, SOB or lightheadedness with hypotension:Yes Has patient had a PCN reaction causing severe rash involving mucus membranes or skin necrosis: No Has patient had a PCN reaction that required hospitalization: No Has patient had a PCN reaction occurring within the last 10 years: No If all of the above answers are "NO", then may proceed with Cephalosporin use.   . Codeine Hives, Itching and Rash    Tolerated oxycodone and morphine previously  . Hydrocodone-Acetaminophen Hives, Itching and Rash    Tolerated oxycodone and morphine previously  . Penicillins Hives, Itching and Rash    ALLERGIC REACTION TO ORAL AMOXICILLIN Has patient had a PCN reaction causing immediate rash, facial/tongue/throat swelling, SOB or lightheadedness with hypotension: Yes Has patient had a PCN reaction causing severe rash involving mucus membranes or skin necrosis: No Has patient had a PCN reaction that required hospitalization: No Has patient had a PCN reaction occurring within the last 10 years: No If all of the above answers are "NO", then may proceed with Cephalosporin use.  . Tape Other (See Comments)    sore    Medications: Outpatient Encounter Medications as of 06/04/2020  Medication Sig  . acetaminophen (TYLENOL) 500 MG tablet Take 2 tablets (1,000 mg total) by mouth every 6 (six) hours as needed. (Patient taking differently: Take 1,000 mg by mouth every 6 (six) hours as needed for mild pain or headache. )  . amLODipine (NORVASC) 5 MG tablet TAKE  1 TABLET(5 MG) BY MOUTH DAILY  . aspirin EC 81 MG tablet Take 81 mg by mouth daily.   Marland Kitchen azelastine (ASTELIN) 0.1 % nasal spray Place 2 sprays into both nostrils 2 (two) times daily. Use in each nostril as directed  . Blood Glucose Monitoring Suppl (ACCU-CHEK NANO SMARTVIEW) W/DEVICE KIT Use to check blood sugar 1-2 times daily (Patient not taking: Reported on 06/04/2020)  . Carboxymethylcellul-Glycerin (REFRESH OPTIVE OP) Apply to eye daily.   . Carboxymethylcellul-Glycerin (REFRESH RELIEVA OP) Apply to eye as directed.  . cetirizine (ZYRTEC) 10 MG tablet Take 10 mg by mouth at bedtime.   . diclofenac sodium (VOLTAREN) 1 % GEL APPLY 2-4 GRAMS TO AFFECTED JOINTS UP TO FOUR TIMES DAILY  . famotidine (PEPCID) 40 MG tablet Take 40 mg by mouth daily.  . feeding supplement, ENSURE ENLIVE, (ENSURE ENLIVE) LIQD Take 237 mLs by mouth 3 (three) times daily between meals. (Patient taking differently: Take 237 mLs by mouth 2 (two) times daily after a meal. )  . fluticasone (FLONASE) 50 MCG/ACT nasal spray Place 1-2 sprays into both nostrils at bedtime.   Marland Kitchen glucose blood test strip Accu-chek nano, test glucose once daily, dx E11.9 (Patient not taking: Reported on 06/04/2020)  . ipratropium-albuterol (DUONEB) 0.5-2.5 (3) MG/3ML SOLN Take 3 mLs by nebulization 3 (three) times daily.  Marland Kitchen ipratropium-albuterol (DUONEB) 0.5-2.5 (3) MG/3ML SOLN Take 3 mLs by nebulization every 4 (four) hours as needed.  . Lancets Misc. (ACCU-CHEK FASTCLIX LANCET) KIT USE TO CHECK BLOOD SUGAR 1-2 TIMES DAILY Dx E11.9 (Patient not taking: Reported on 06/04/2020)  . linaclotide (LINZESS) 145  MCG CAPS capsule Take 145 mcg by mouth daily as needed (constipation).  Marland Kitchen losartan (COZAAR) 25 MG tablet TAKE 1 TABLET(25 MG) BY MOUTH DAILY  . montelukast (SINGULAIR) 10 MG tablet TAKE 1 TABLET(10 MG) BY MOUTH AT BEDTIME  . Multiple Vitamins-Minerals (CENTRUM SILVER 50+WOMEN) TABS Take 1 tablet by mouth daily.  Marland Kitchen neomycin-bacitracin-polymyxin  (NEOSPORIN) OINT Neosporin (neo-bac-polym)  . nitroGLYCERIN (NITRO-BID) 2 % ointment APPLY 1/4 INCH TOPICALLY TO AFFECTED FINGERTIPS 3 TIMES A DAY  . NON FORMULARY Respivest: Three times a day for 30 minutes  . ondansetron (ZOFRAN-ODT) 4 MG disintegrating tablet DISSOLVE 1 TABLET(4 MG) ON THE TONGUE EVERY 8 HOURS AS NEEDED FOR NAUSEA OR VOMITING  . Respiratory Therapy Supplies (FLUTTER) DEVI 1 Device by Does not apply route as directed.  . sodium chloride HYPERTONIC 3 % nebulizer solution USE 1 VIAL VIA NEBULIZER TWICE A DAY.  Marland Kitchen Tiotropium Bromide-Olodaterol (STIOLTO RESPIMAT) 2.5-2.5 MCG/ACT AERS Inhale 2 puffs into the lungs daily.  Marland Kitchen triamcinolone cream (KENALOG) 0.1 % Apply 1 application topically daily as needed (for itching of affected areas).   Marland Kitchen UNABLE TO FIND 3 (three) times daily. Med Name: Refresh Relivia eyedrops   No facility-administered encounter medications on file as of 06/04/2020.    Current Diagnosis: Patient Active Problem List   Diagnosis Date Noted  . Unilateral primary osteoarthritis, right hip 04/27/2020  . COPD mixed type ([place]) 12/26/2018  . Healthcare maintenance 12/26/2018  . History of fall 11/05/2018  . Weight loss 11/05/2018  . Weakness 11/05/2018  . Gangrene of finger ([place]) 10/09/2018  . Raynaud's phenomenon with gangrene ([place]) 10/09/2018  . LVH (left ventricular hypertrophy) 10/09/2018  . Vasospasm ([place]) 09/06/2018  . Hypotension 08/08/2018  . Leucocytosis 08/08/2018  . Elevated troponin I level 08/08/2018  . Nausea and vomiting 08/06/2018  . Pneumonia 07/12/2018  . COPD exacerbation ([place]) 07/11/2018  . Primary osteoarthritis of both feet 12/21/2017  . DDD (degenerative disc disease), lumbar 12/21/2017  . Primary osteoarthritis of both knees 12/21/2017  . History of bilateral carpal tunnel release 12/21/2017  . DDD (degenerative disc disease), cervical 11/15/2017  . Former smoker 11/15/2017  . Bronchiectasis without complication ([place]) 24/46/9507    . Impingement syndrome of right shoulder 07/25/2017  . Hx of fusion of cervical spine 07/25/2017  . Cervical spinal stenosis 09/27/2016  . Polypharmacy 06/23/2015  . Hyperlipidemia 04/19/2015  . Visit for preventive health examination 01/01/2015  . Primary osteoarthritis involving multiple joints 01/01/2015  . Bipolar affective disorder, currently depressed, moderate ([place])   . Bipolar I disorder with mania ([place]) 08/17/2014  . Hypertension 10/21/2013  . Dizziness 03/25/2013  . Medication withdrawal ([place]) 03/05/2013  . Diabetes mellitus with renal manifestations, controlled ([place]) 11/10/2012  . Alopecia areata 02/06/2012  . Obesity (BMI 30-39.9) 02/06/2012  . Renal insufficiency 07/16/2011  . Neuroleptic-induced tardive dyskinesia   . Exertional dyspnea 02/07/2011  . Dyspnea on exertion 01/19/2011  . DM (diabetes mellitus), type 2 ([place]) 04/22/2010  . Carpal tunnel syndrome 02/02/2010  . CONSTIPATION 02/02/2010  . Primary osteoarthritis of both hands 02/02/2010  . CATARACTS 04/13/2009  . PAIN IN JOINT, ANKLE AND FOOT 09/16/2008  . Eosinophilia 07/21/2008  . AFFECTIVE DISORDER 04/21/2008  . BACK PAIN, CHRONIC 02/12/2008  . LEG PAIN 02/12/2008  . ABNORMAL INVOLUNTARY MOVEMENTS 12/04/2007  . POSTURAL LIGHTHEADEDNESS 11/04/2007  . COLONIC POLYPS, HX OF 11/04/2007  . Essential hypertension 06/17/2007  . HYPERLIPIDEMIA 04/08/2007  . MITRAL VALVE PROLAPSE 04/08/2007  . GERD 04/08/2007  . LOW BACK PAIN SYNDROME 04/08/2007  .  CHEST PAIN, RECURRENT 04/08/2007    Goals Addressed   None    Reviewed chart for medication changes ahead of medication coordination call.  No OVs or hospital visits since last Pharmacist visit.   Consult 05-12-2020:( Cardiology): Patient presented in the office for Pre-Op exam with Dr. Johnsie Cancel before undergoing total hip replacement. No medication changes were made.  No medication changes indicated.  BP Readings from Last 3 Encounters:  05/12/20 108/62   05/11/20 110/76  04/08/20 138/76    Lab Results  Component Value Date   HGBA1C 5.7 (A) 01/07/2020    06-16-2020; Attempted to contact the patient for medication review. I have made two attempts prior to today, but unable to reach the patient. The patient and I spoke briefly before the line was disconnected. I will continue to try and determine what medication the patient needs, as the information documented from previous medication adherence did not indicate any medication needed at this time. The patient called [facility] for refills.   06-17-2020. Attempted to make contact with patient in order to coordinate a medication delivery. Left a message on both telephone numbers listed.  Patient obtains medications through Vials  90 Days   Last adherence delivery included:   Singulair 51m; one tab at Bedtime  Amlodipine 554m one tab at Breakfast  Fluticasone Propionate 5062mnasal spray  Patient declined (meds) last month due to PRN use/additional supply on hand. Explanation of abundance on hand. Patient is in the process of getting synced with [facility]. Has medication on had from previous fills.  Patient is due for next adherence delivery on: 06-21-2020. Called patient and reviewed medications and coordinated delivery.  This delivery to include:  Ondansetron 4mg69missolve one tab in the mouth every eight hours as needed   Cetirizine 10mg63me tab at Breakfast   Patient does not needs refills at this time.  Confirmed delivery date of 06-21-2020, advised patient that pharmacy will contact them the morning of delivery.  Follow-Up:  Coordination of Enhanced Pharmacy Services   Ivey Fanny Skates CKinneymacist Assistant [phone] 06/18/20 Will plan to reach back out to patient in a month for follow up visit to resync medications.   MadelJeni SallesrmD Clinical Pharmacist [place]

## 2020-06-07 ENCOUNTER — Telehealth: Payer: Self-pay | Admitting: *Deleted

## 2020-06-07 ENCOUNTER — Telehealth: Payer: Self-pay | Admitting: Critical Care Medicine

## 2020-06-07 MED ORDER — BREZTRI AEROSPHERE 160-9-4.8 MCG/ACT IN AERO: 2.0000 | INHALATION_SPRAY | Freq: Two times a day (BID) | RESPIRATORY_TRACT | 0 refills | Status: DC

## 2020-06-07 NOTE — Telephone Encounter (Signed)
Called and spoke with pt letting her know the info stated by Dr. Chestine Spore and also in regards to the info stated by Rachael about the inhalers.  Pt stated that she did try patient assistance for Anoro but was not approved due to them saying that she made too much. Pt stated that she will try again with patient assistance to see if she could be approved this time.  I stated to pt that I was going to put a sample of Breztri up front for her as well as patient assistance paperwork and she verbalized understanding. Nothing further needed.

## 2020-06-07 NOTE — Telephone Encounter (Signed)
Patient called after hours line on 06/04/2020. Patient reports  she had the Phizer vaccine and she would like to know if she needs a booster shot.

## 2020-06-07 NOTE — Telephone Encounter (Signed)
Spoke with patient. Informed patient per Dr. Fabian Sharp She does not have the indication for booster shot at this time ( such as active  hiv and chemotherapy and organ transplant  Therapy). So not need for booster. Patient verbalized understanding.

## 2020-06-07 NOTE — Telephone Encounter (Signed)
Called and spoke with pt in regards to her Stiolto inhaler. Stated to pt that cost of most inhalers will be about the same price with  Her being in the donut hole.  Pt is wanting to know if there is something that could be prescribed to help loosen the phlegm in her chest. Pt states that she is using the vest at least 3 times daily and is also using her flutter valve.  Pt states that she is wheezing. Pt is able to get some phlegm up and states that the phlegm is clear. Pt states that she also has problems with nasal congestion even after using azelastine nasal spray.  Pt states the nasal spray has been helping in the morning but is not lasting that long to help with the congestion.  Pt denies any complaints of fever.  Pt has  Had to use her nebulizer three times daily to help with her symptoms which pt is unsure if it is really helping.  Pt states that she feels like she is more congested than usual and states all is down in her chest.  Pt wants to know if there are any recommendations if there might be a different inhaler that could be prescribed that might be better in cost with her being in the donut hole. Pt also wants to know if there is something that could be prescribed to help with her nasal congestion as well as the symptoms she has in her chest. Dr. Chestine Spore, please advise.

## 2020-06-07 NOTE — Telephone Encounter (Signed)
She does not have the indication for booster shot at this time ( such as active  hiv and chemotherapy and organ transplant  Therapy)    So not need for booster

## 2020-06-07 NOTE — Telephone Encounter (Signed)
Rachael, please advise out of Breztri or Trelegy which one will be cheaper for pt.

## 2020-06-07 NOTE — Telephone Encounter (Signed)
ll

## 2020-06-07 NOTE — Telephone Encounter (Signed)
Can try Breztri BID or Trelegy once daily. For nasal congestion can add flonase to azelastine. Can also add either claritin or zyrtec daily.  Steffanie Dunn, DO 06/07/20 2:17 PM [facility]

## 2020-06-07 NOTE — Telephone Encounter (Signed)
Patient is currently in the coverage gap. Test claims for 1 month supply.  Trelegy- $213.08  Breztri- $113.33  Patient should apply for patient assistance.

## 2020-06-09 ENCOUNTER — Ambulatory Visit: Payer: Medicare HMO | Admitting: Physician Assistant

## 2020-06-10 ENCOUNTER — Other Ambulatory Visit: Payer: Self-pay | Admitting: Internal Medicine

## 2020-06-23 ENCOUNTER — Telehealth: Payer: Self-pay | Admitting: Critical Care Medicine

## 2020-06-23 MED ORDER — BREZTRI AEROSPHERE 160-9-4.8 MCG/ACT IN AERO: 2.0000 | INHALATION_SPRAY | Freq: Two times a day (BID) | RESPIRATORY_TRACT | 0 refills | Status: DC

## 2020-06-23 NOTE — Telephone Encounter (Signed)
This is progress in previous encounter from a couple weeks ago. Not sure if patient picked up PAP application.

## 2020-06-23 NOTE — Telephone Encounter (Signed)
Spoke with patient and she stated that the Breztri was over $100 and cannot afford to pay that. She said the Markus Daft works really well, it is just the cost.  I let her know that I will contact our pharmacy team to find out what her insurance will cover and then we will contact her back.  She only has a few doses left, so I have left some samples up front until we can determine what inhaler her insurance will cover.  Pharmacy team, please advise.  Thank you.

## 2020-06-24 NOTE — Telephone Encounter (Signed)
lmtcb for pt.  
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
## 2020-06-29 NOTE — Telephone Encounter (Signed)
Pt returning call.  [phone].

## 2020-06-29 NOTE — Telephone Encounter (Signed)
Called and spoke with pt checking to see if she ever filled out patient assistance application for the [facility] and she said she did come by office mid-August around 8/17 to fill out the application. Pt said she filled it out the day she came by office and handed it back to front staff once completed.  Stated to pt that we would follow up on the application for her and then would call her back once we had an update and she verbalized understanding. Pt stated that she was not currently out of previous Breztri Rx that she had. Stated that some samples had been left up front at office for her that she could come by and pick up and she verbalized understanding.   Called [employer] at [phone] to check on status of pt's application. Spoke with Kennon Rounds who said that pt's application is still showing in status of being processed. After checking, application is approved until December 2021. Approval letter will be sent to pt and also will include information on how to renew application when it it time. Called and spoke with pt letting her know that the application had been approved and she verbalized understanding. Nothing further needed.

## 2020-07-01 ENCOUNTER — Telehealth (INDEPENDENT_AMBULATORY_CARE_PROVIDER_SITE_OTHER): Payer: Medicare HMO | Admitting: Cardiovascular Disease

## 2020-07-01 ENCOUNTER — Other Ambulatory Visit: Payer: Self-pay

## 2020-07-01 VITALS — BP 148/51 | Ht 62.0 in | Wt 144.8 lb

## 2020-07-01 DIAGNOSIS — I10 Essential (primary) hypertension: Secondary | ICD-10-CM

## 2020-07-01 DIAGNOSIS — I7301 Raynaud's syndrome with gangrene: Secondary | ICD-10-CM

## 2020-07-01 NOTE — Patient Instructions (Signed)

## 2020-07-02 ENCOUNTER — Telehealth: Payer: Self-pay | Admitting: Internal Medicine

## 2020-07-02 NOTE — Telephone Encounter (Signed)
Pt is calling to say she has been having headaches and is wondering if it could be caused by her BP. Been having the headaches for about 3 weeks now.  She has an appointment on 09/17 and is wondering if she needs to come earlier or just wait.  Please advise

## 2020-07-02 NOTE — Telephone Encounter (Signed)
Please see message.  Please advise. 

## 2020-07-03 NOTE — Telephone Encounter (Signed)
Keep appt   Check bp readings at home and let us know  I see that cardiology saw you 2 days ago   If fever or alarm sx ( nurse triage) worry. etc

## 2020-07-06 NOTE — Telephone Encounter (Signed)
Called patient and LMOVM to return call  Left a detailed voice message to let patient know to keep appointment on 9/17 and to keep a recording of her BP levels for her visit.

## 2020-07-08 ENCOUNTER — Telehealth: Payer: Self-pay | Admitting: Pharmacist

## 2020-07-08 ENCOUNTER — Other Ambulatory Visit: Payer: Self-pay

## 2020-07-08 DIAGNOSIS — I10 Essential (primary) hypertension: Secondary | ICD-10-CM

## 2020-07-08 DIAGNOSIS — E785 Hyperlipidemia, unspecified: Secondary | ICD-10-CM
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
## 2020-07-08 NOTE — Progress Notes (Addendum)
Chronic Care Management Pharmacy Assistant   Name: Desiree [name]  MRN: [id number] DOB: May 04, 1951  Reason for Encounter: Medication Review  Patient Questions:  1.  Have you seen any other providers since your last visit? No  2.  Any changes in your medicines or health? Yes, 06-07-20 Budeson-Glycopyrrol-Formoterol (BREZTRI AEROSPHERE) 160-9-4.8 MCG/ACT AERO Inhale 2 puffs into the lungs in the morning and at bedtime.   PCP : [name], MD  Allergies:   Allergies  Allergen Reactions  . Prednisone Shortness Of Breath, Itching, Nausea And Vomiting and Palpitations  . Solu-Medrol [Methylprednisolone] Anaphylaxis  . Amoxicillin Hives and Rash    Has patient had a PCN reaction causing immediate rash, facial/tongue/throat swelling, SOB or lightheadedness with hypotension:Yes Has patient had a PCN reaction causing severe rash involving mucus membranes or skin necrosis: No Has patient had a PCN reaction that required hospitalization: No Has patient had a PCN reaction occurring within the last 10 years: No If all of the above answers are "NO", then may proceed with Cephalosporin use.   . Codeine Hives, Itching and Rash    Tolerated oxycodone and morphine previously  . Hydrocodone-Acetaminophen Hives, Itching and Rash    Tolerated oxycodone and morphine previously  . Penicillins Hives, Itching and Rash    ALLERGIC REACTION TO ORAL AMOXICILLIN Has patient had a PCN reaction causing immediate rash, facial/tongue/throat swelling, SOB or lightheadedness with hypotension: Yes Has patient had a PCN reaction causing severe rash involving mucus membranes or skin necrosis: No Has patient had a PCN reaction that required hospitalization: No Has patient had a PCN reaction occurring within the last 10 years: No If all of the above answers are "NO", then may proceed with Cephalosporin use.  . Tape Other (See Comments)    sore    Medications: Outpatient Encounter Medications as of 07/08/2020    Medication Sig  . acetaminophen (TYLENOL) 500 MG tablet Take 2 tablets (1,000 mg total) by mouth every 6 (six) hours as needed. (Patient taking differently: Take 1,000 mg by mouth every 6 (six) hours as needed for mild pain or headache. )  . amLODipine (NORVASC) 5 MG tablet TAKE 1 TABLET(5 MG) BY MOUTH DAILY  . aspirin EC 81 MG tablet Take 81 mg by mouth daily.   Marland Kitchen azelastine (ASTELIN) 0.1 % nasal spray Place 2 sprays into both nostrils 2 (two) times daily. Use in each nostril as directed  . Blood Glucose Monitoring Suppl (ACCU-CHEK NANO SMARTVIEW) W/DEVICE KIT Use to check blood sugar 1-2 times daily  . Budeson-Glycopyrrol-Formoterol (BREZTRI AEROSPHERE) 160-9-4.8 MCG/ACT AERO Inhale 2 puffs into the lungs in the morning and at bedtime.  . Carboxymethylcellul-Glycerin (REFRESH OPTIVE OP) Apply to eye daily.   . Carboxymethylcellul-Glycerin (REFRESH RELIEVA OP) Apply to eye as directed.  . cetirizine (ZYRTEC) 10 MG tablet Take 10 mg by mouth at bedtime.   . diclofenac sodium (VOLTAREN) 1 % GEL APPLY 2-4 GRAMS TO AFFECTED JOINTS UP TO FOUR TIMES DAILY  . famotidine (PEPCID) 40 MG tablet Take 40 mg by mouth daily.  . feeding supplement, ENSURE ENLIVE, (ENSURE ENLIVE) LIQD Take 237 mLs by mouth 3 (three) times daily between meals. (Patient taking differently: Take 237 mLs by mouth 2 (two) times daily after a meal. )  . fluticasone (FLONASE) 50 MCG/ACT nasal spray Place 1-2 sprays into both nostrils at bedtime.   Marland Kitchen glucose blood test strip Accu-chek nano, test glucose once daily, dx E11.9  . Lancets Misc. (ACCU-CHEK FASTCLIX LANCET) KIT USE  TO CHECK BLOOD SUGAR 1-2 TIMES DAILY Dx E11.9 (Patient not taking: Reported on 06/04/2020)  . linaclotide (LINZESS) 145 MCG CAPS capsule Take 145 mcg by mouth daily as needed (constipation).  Marland Kitchen losartan (COZAAR) 25 MG tablet TAKE 1 TABLET(25 MG) BY MOUTH DAILY  . montelukast (SINGULAIR) 10 MG tablet TAKE 1 TABLET(10 MG) BY MOUTH AT BEDTIME  . Multiple  Vitamins-Minerals (CENTRUM SILVER 50+WOMEN) TABS Take 1 tablet by mouth daily.  Marland Kitchen neomycin-bacitracin-polymyxin (NEOSPORIN) OINT Neosporin (neo-bac-polym)  . nitroGLYCERIN (NITRO-BID) 2 % ointment APPLY 1/4 INCH TOPICALLY TO AFFECTED FINGERTIPS 3 TIMES A DAY  . NON FORMULARY Respivest: Three times a day for 30 minutes  . ondansetron (ZOFRAN-ODT) 4 MG disintegrating tablet DISSOLVE 1 TABLET(4 MG) ON THE TONGUE EVERY 8 HOURS AS NEEDED FOR NAUSEA OR VOMITING  . Respiratory Therapy Supplies (FLUTTER) DEVI 1 Device by Does not apply route as directed.  . sodium chloride HYPERTONIC 3 % nebulizer solution USE 1 VIAL VIA NEBULIZER TWICE A DAY.  Marland Kitchen Tiotropium Bromide-Olodaterol (STIOLTO RESPIMAT) 2.5-2.5 MCG/ACT AERS Inhale 2 puffs into the lungs daily. (Patient not taking: Reported on 07/01/2020)  . triamcinolone cream (KENALOG) 0.1 % Apply 1 application topically daily as needed (for itching of affected areas).   Marland Kitchen UNABLE TO FIND 3 (three) times daily. Med Name: Refresh Relivia eyedrops   No facility-administered encounter medications on file as of 07/08/2020.    Current Diagnosis: Patient Active Problem List   Diagnosis Date Noted  . Unilateral primary osteoarthritis, right hip 04/27/2020  . COPD mixed type ([place]) 12/26/2018  . Healthcare maintenance 12/26/2018  . History of fall 11/05/2018  . Weight loss 11/05/2018  . Weakness 11/05/2018  . Gangrene of finger ([place]) 10/09/2018  . Raynaud's phenomenon with gangrene ([place]) 10/09/2018  . LVH (left ventricular hypertrophy) 10/09/2018  . Vasospasm ([place]) 09/06/2018  . Hypotension 08/08/2018  . Leucocytosis 08/08/2018  . Elevated troponin I level 08/08/2018  . Nausea and vomiting 08/06/2018  . Pneumonia 07/12/2018  . COPD exacerbation ([place]) 07/11/2018  . Primary osteoarthritis of both feet 12/21/2017  . DDD (degenerative disc disease), lumbar 12/21/2017  . Primary osteoarthritis of both knees 12/21/2017  . History of bilateral carpal tunnel release  12/21/2017  . DDD (degenerative disc disease), cervical 11/15/2017  . Former smoker 11/15/2017  . Bronchiectasis without complication ([place]) 19/37/9024  . Impingement syndrome of right shoulder 07/25/2017  . Hx of fusion of cervical spine 07/25/2017  . Cervical spinal stenosis 09/27/2016  . Polypharmacy 06/23/2015  . Hyperlipidemia 04/19/2015  . Visit for preventive health examination 01/01/2015  . Primary osteoarthritis involving multiple joints 01/01/2015  . Bipolar affective disorder, currently depressed, moderate ([place])   . Bipolar I disorder with mania ([place]) 08/17/2014  . Hypertension 10/21/2013  . Dizziness 03/25/2013  . Medication withdrawal ([place]) 03/05/2013  . Diabetes mellitus with renal manifestations, controlled ([place]) 11/10/2012  . Alopecia areata 02/06/2012  . Obesity (BMI 30-39.9) 02/06/2012  . Renal insufficiency 07/16/2011  . Neuroleptic-induced tardive dyskinesia   . Exertional dyspnea 02/07/2011  . Dyspnea on exertion 01/19/2011  . DM (diabetes mellitus), type 2 ([place]) 04/22/2010  . Carpal tunnel syndrome 02/02/2010  . CONSTIPATION 02/02/2010  . Primary osteoarthritis of both hands 02/02/2010  . CATARACTS 04/13/2009  . PAIN IN JOINT, ANKLE AND FOOT 09/16/2008  . Eosinophilia 07/21/2008  . AFFECTIVE DISORDER 04/21/2008  . BACK PAIN, CHRONIC 02/12/2008  . LEG PAIN 02/12/2008  . ABNORMAL INVOLUNTARY MOVEMENTS 12/04/2007  . POSTURAL LIGHTHEADEDNESS 11/04/2007  . COLONIC POLYPS, HX OF 11/04/2007  .  Essential hypertension 06/17/2007  . HYPERLIPIDEMIA 04/08/2007  . MITRAL VALVE PROLAPSE 04/08/2007  . GERD 04/08/2007  . LOW BACK PAIN SYNDROME 04/08/2007  . CHEST PAIN, RECURRENT 04/08/2007    Goals Addressed   None    Reviewed chart for medication changes ahead of medication coordination call.  06-04-2020 [place]   06-07-20 Budeson-Glycopyrrol-Formoterol (BREZTRI AEROSPHERE) 160-9-4.8 MCG/ACT AERO Inhale 2 puffs into the lungs in the  morning and at bedtime. Discontinued Ipratropium 0.5 Mg-Albuterol 3 Mg (2.5 Mg Base)/3 Ml Nebulization Soln  BP Readings from Last 3 Encounters:  07/01/20 (!) 148/51  05/12/20 108/62  05/11/20 110/76    Lab Results  Component Value Date   HGBA1C 5.7 (A) 01/07/2020     Patient obtains medications through Adherence Packaging  90 Days  Last adherence delivery included:  . Ondansetron 59m; PRN . Cetirizine 154m one tab at Breakfast  Patient is due for next adherence delivery on: 07-16-2020 Called patient and reviewed medications and coordinated delivery.  This delivery to include: . Marland Kitchenontelukast 1060mone tab at Bedtime . Famotidine 75m70mne tab at Breakfast  . Amlodipine 5mg;51me tab at Breakfast  Patient declined the following medications: . Sodium Chloride nebulizer 3% . Centrum Silver 50+ Women's; one tab at [place] Citycg93me cap  . Asprin 81mg; 67mtab at Breakfast . Lamotrigine 25mg; o71mab with Breakfast, one tab with Dinner (not taking) . Losartan 25mg; on86mb at Breakfast . Nitroglycerin 2% ointment; PRN . Diclofenac 1% gel; PRN . Trazadone 50mg; PRN65muloxetine 30mg cap (50mtaking) . Accu-check Smartview test strips . Anoro Ellipta inhaler  Patient states she is NOT taking Duloxetine 30mg cap an31mmotrigine 25mg. Patien35mso stated she has the following medication on hand from previous fills: Centrum Silver 50+ Women's, Linzess 145mcg, Asprin23mg, Amlodipi23mmg, Lamotrigin41m5mg, Losartan 261m Trazadone 532m Confirmed de32mry date of 07-16-2020, advised patient that pharmacy will contact them the morning of delivery.  Follow-Up:  Coordination of Enhanced Pharmacy Services  Desiree [name], CMARumaicMare [id number]  [phone] to determine re-sync plan as patient still had an abundance of some medications. Estimated 60 days to start adherence packaging and patient was ok with discarding left over  medications after 2 months.  Coordinated acute fill for Linzess for 07-13-20.  Desiree [name], PhaJeni Sallesharmacist [facility] [place]
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
## 2020-07-09 ENCOUNTER — Encounter: Payer: Self-pay | Admitting: Internal Medicine

## 2020-07-09 ENCOUNTER — Other Ambulatory Visit: Payer: Self-pay

## 2020-07-09 ENCOUNTER — Ambulatory Visit (INDEPENDENT_AMBULATORY_CARE_PROVIDER_SITE_OTHER): Payer: Medicare HMO | Admitting: Internal Medicine

## 2020-07-09 VITALS — BP 118/66 | HR 92 | Temp 98.7°F | Ht 62.5 in | Wt 147.8 lb

## 2020-07-09 DIAGNOSIS — G479 Sleep disorder, unspecified: Secondary | ICD-10-CM

## 2020-07-09 DIAGNOSIS — E1122 Type 2 diabetes mellitus with diabetic chronic kidney disease: Secondary | ICD-10-CM

## 2020-07-09 DIAGNOSIS — S8002XA Contusion of left knee, initial encounter: Secondary | ICD-10-CM

## 2020-07-09 DIAGNOSIS — J449 Chronic obstructive pulmonary disease, unspecified: Secondary | ICD-10-CM | POA: Diagnosis not present

## 2020-07-09 DIAGNOSIS — I7301 Raynaud's syndrome with gangrene: Secondary | ICD-10-CM

## 2020-07-09 DIAGNOSIS — I10 Essential (primary) hypertension: Secondary | ICD-10-CM | POA: Diagnosis not present

## 2020-07-09 DIAGNOSIS — W06XXXA Fall from bed, initial encounter: Secondary | ICD-10-CM

## 2020-07-09 DIAGNOSIS — Z23 Encounter for immunization: Secondary | ICD-10-CM | POA: Diagnosis not present

## 2020-07-09 DIAGNOSIS — S8001XA Contusion of right knee, initial encounter: Secondary | ICD-10-CM

## 2020-07-09 DIAGNOSIS — N183 Chronic kidney disease, stage 3 unspecified: Secondary | ICD-10-CM | POA: Diagnosis not present

## 2020-07-09 LAB — POCT GLYCOSYLATED HEMOGLOBIN (HGB A1C): Hemoglobin A1C: 5.7 % — AB (ref 4.0–5.6)

## 2020-07-09 MED ORDER — ACCU-CHEK FASTCLIX LANCETS MISC: 3 refills | Status: DC

## 2020-07-09 MED ORDER — ACCU-CHEK SMARTVIEW VI STRP: ORAL_STRIP | 3 refills | Status: DC

## 2020-07-09 MED ORDER — ACCU-CHEK NANO SMARTVIEW W/DEVICE KIT: PACK | 0 refills | Status: DC

## 2020-07-09 NOTE — Patient Instructions (Addendum)
Will do referral to sleep  specialist because of your episodes of falling out of bed in sleep    .   Otherwise  Diabetes is stable .  Get shingsl vaccine at your pharmacy     Recombinant Zoster (Shingles) Vaccine: What You Need to Know 1. Why get vaccinated? Recombinant zoster (shingles) vaccine can prevent shingles. Shingles (also called herpes zoster, or just zoster) is a painful skin rash, usually with blisters. In addition to the rash, shingles can cause fever, headache, chills, or upset stomach. More rarely, shingles can lead to pneumonia, hearing problems, blindness, brain inflammation (encephalitis), or death. The most common complication of shingles is long-term nerve pain called postherpetic neuralgia (PHN). PHN occurs in the areas where the shingles rash was, even after the rash clears up. It can last for months or years after the rash goes away. The pain from PHN can be severe and debilitating. About 10 to 18% of people who get shingles will experience PHN. The risk of PHN increases with age. An older adult with shingles is more likely to develop PHN and have longer lasting and more severe pain than a younger person with shingles. Shingles is caused by the varicella zoster virus, the same virus that causes chickenpox. After you have chickenpox, the virus stays in your body and can cause shingles later in life. Shingles cannot be passed from one person to another, but the virus that causes shingles can spread and cause chickenpox in someone who had never had chickenpox or received chickenpox vaccine. 2. Recombinant shingles vaccine Recombinant shingles vaccine provides strong protection against shingles. By preventing shingles, recombinant shingles vaccine also protects against PHN. Recombinant shingles vaccine is the preferred vaccine for the prevention of shingles. However, a different vaccine, live shingles vaccine, may be used in some circumstances. The recombinant shingles vaccine is  recommended for adults 50 years and older without serious immune problems. It is given as a two-dose series. This vaccine is also recommended for people who have already gotten another type of shingles vaccine, the live shingles vaccine. There is no live virus in this vaccine. Shingles vaccine may be given at the same time as other vaccines. 3. Talk with your health care provider Tell your vaccine provider if the person getting the vaccine:  Has had an allergic reaction after a previous dose of recombinant shingles vaccine, or has any severe, life-threatening allergies.  Is pregnant or breastfeeding.  Is currently experiencing an episode of shingles. In some cases, your health care provider may decide to postpone shingles vaccination to a future visit. People with minor illnesses, such as a cold, may be vaccinated. People who are moderately or severely ill should usually wait until they recover before getting recombinant shingles vaccine. Your health care provider can give you more information. 4. Risks of a vaccine reaction  A sore arm with mild or moderate pain is very common after recombinant shingles vaccine, affecting about 80% of vaccinated people. Redness and swelling can also happen at the site of the injection.  Tiredness, muscle pain, headache, shivering, fever, stomach pain, and nausea happen after vaccination in more than half of people who receive recombinant shingles vaccine. In clinical trials, about 1 out of 6 people who got recombinant zoster vaccine experienced side effects that prevented them from doing regular activities. Symptoms usually went away on their own in 2 to 3 days. You should still get the second dose of recombinant zoster vaccine even if you had one of these reactions after  the first dose. People sometimes faint after medical procedures, including vaccination. Tell your provider if you feel dizzy or have vision changes or ringing in the ears. As with any  medicine, there is a very remote chance of a vaccine causing a severe allergic reaction, other serious injury, or death. 5. What if there is a serious problem? An allergic reaction could occur after the vaccinated person leaves the clinic. If you see signs of a severe allergic reaction (hives, swelling of the face and throat, difficulty breathing, a fast heartbeat, dizziness, or weakness), call 9-1-1 and get the person to the nearest hospital. For other signs that concern you, call your health care provider. Adverse reactions should be reported to the Vaccine Adverse Event Reporting System (VAERS). Your health care provider will usually file this report, or you can do it yourself. Visit the VAERS website at www.vaers.LAgents.no or call 202-847-0136. VAERS is only for reporting reactions, and VAERS staff do not give medical advice. 6. How can I learn more?  Ask your health care provider.  Call your local or state health department.  Contact the Centers for Disease Control and Prevention (CDC): ? Call 682-595-8010 (1-800-CDC-INFO) or ? Visit CDC's website at PicCapture.uy Vaccine Information Statement Recombinant Zoster Vaccine (08/21/2018) This information is not intended to replace advice given to you by your health care provider. Make sure you discuss any questions you have with your health care provider. Document Revised: 01/28/2019 Document Reviewed: 05/15/2018 Elsevier Patient Education  2020 [employer].

## 2020-07-12 ENCOUNTER — Telehealth: Payer: Self-pay | Admitting: Pharmacist

## 2020-07-12 ENCOUNTER — Telehealth: Payer: Medicare HMO

## 2020-07-12 ENCOUNTER — Telehealth: Payer: Self-pay | Admitting: Internal Medicine

## 2020-07-12 NOTE — Telephone Encounter (Signed)
Pt said she called [employer] to get some alcohol swabs sent but they need a order faxed to them for approval   [employer] Fax  [phone]  Please advise

## 2020-07-12 NOTE — Chronic Care Management (AMB) (Signed)
Chronic Care Management Pharmacy Assistant   Name: Desiree [name]  MRM: [id number] DB: Dec 22, 1950  Reason for Encounter: Medication Review  PCP : [name], MD  Allergies:   Allergies  Allergen Reactions  . Prednisone Shortness Of Breath, Itching, Nausea And Vomiting and Palpitations  . Solu-Medrol [Methylprednisolone] Anaphylaxis  . Amoxicillin Hives and Rash    Has patient had a PCN reaction causing immediate rash, facial/tongue/throat swelling, SOB or lightheadedness with hypotension:Yes Has patient had a PCN reaction causing severe rash involving mucus membranes or skin necrosis: No Has patient had a PCN reaction that required hospitalization: No Has patient had a PCN reaction occurring within the last 10 years: No If all of the above answers are "NO", then may proceed with Cephalosporin use.   . Codeine Hives, Itching and Rash    Tolerated oxycodone and morphine previously  . Hydrocodone-Acetaminophen Hives, Itching and Rash    Tolerated oxycodone and morphine previously  . Penicillins Hives, Itching and Rash    ALLERGIC REACTION TO ORAL AMOXICILLIN Has patient had a PCN reaction causing immediate rash, facial/tongue/throat swelling, SOB or lightheadedness with hypotension: Yes Has patient had a PCN reaction causing severe rash involving mucus membranes or skin necrosis: No Has patient had a PCN reaction that required hospitalization: No Has patient had a PCN reaction occurring within the last 10 years: No If all of the above answers are "NO", then may proceed with Cephalosporin use.  . Tape Other (See Comments)    sore    Medications: Outpatient Encounter Medications as of 07/12/2020  Medication Sig  . Accu-Chek FastClix Lancets MISC Use to test blood glucose 1 to 2 times daily. Dx Code E11.9  . acetaminophen (TYLENOL) 500 MG tablet Take 2 tablets (1,000 mg total) by mouth every 6 (six) hours as needed. (Patient taking differently: Take 1,000 mg by mouth every 6  (six) hours as needed for mild pain or headache. )  . amLODipine (NORVASC) 5 MG tablet TAKE 1 TABLET(5 MG) BY MOUTH DAILY  . aspirin EC 81 MG tablet Take 81 mg by mouth daily.   Marland Kitchen azelastine (ASTELIN) 0.1 % nasal spray Place 2 sprays into both nostrils 2 (two) times daily. Use in each nostril as directed  . Blood Glucose Monitoring Suppl (ACCU-CHEK NANO SMARTVIEW) w/Device KIT Use to check blood sugar 1-2 times daily  . Budeson-Glycopyrrol-Formoterol (BREZTRI AEROSPHERE) 160-9-4.8 MCG/ACT AERO Inhale 2 puffs into the lungs in the morning and at bedtime.  . Carboxymethylcellul-Glycerin (REFRESH OPTIVE OP) Apply to eye daily.   . Carboxymethylcellul-Glycerin (REFRESH RELIEVA OP) Apply to eye as directed.  . cetirizine (ZYRTEC) 10 MG tablet Take 10 mg by mouth at bedtime.   . diclofenac sodium (VOLTAREN) 1 % GEL APPLY 2-4 GRAMS TO AFFECTED JOINTS UP TO FOUR TIMES DAILY  . famotidine (PEPCID) 40 MG tablet Take 40 mg by mouth daily.  . feeding supplement, ENSURE ENLIVE, (ENSURE ENLIVE) LIQD Take 237 mLs by mouth 3 (three) times daily between meals. (Patient taking differently: Take 237 mLs by mouth 2 (two) times daily after a meal. )  . fluticasone (FLONASE) 50 MCG/ACT nasal spray Place 1-2 sprays into both nostrils at bedtime.   Marland Kitchen glucose blood (ACCU-CHEK SMARTVIEW) test strip Use as instructed to test blood glucose 1 to 2 times daily. Dx Code E11.9  . glucose blood test strip Accu-chek nano, test glucose once daily, dx E11.9  . Lancets Misc. (ACCU-CHEK FASTCLIX LANCET) KIT USE TO CHECK BLOOD SUGAR 1-2 TIMES DAILY  Dx E11.9 (Patient not taking: Reported on 06/04/2020)  . linaclotide (LINZESS) 145 MCG CAPS capsule Take 145 mcg by mouth daily as needed (constipation).  Marland Kitchen losartan (COZAAR) 25 MG tablet TAKE 1 TABLET(25 MG) BY MOUTH DAILY  . montelukast (SINGULAIR) 10 MG tablet TAKE 1 TABLET(10 MG) BY MOUTH AT BEDTIME  . Multiple Vitamins-Minerals (CENTRUM SILVER 50+WOMEN) TABS Take 1 tablet by mouth daily.   Marland Kitchen neomycin-bacitracin-polymyxin (NEOSPORIN) OINT Neosporin (neo-bac-polym)  . nitroGLYCERIN (NITRO-BID) 2 % ointment APPLY 1/4 INCH TOPICALLY TO AFFECTED FINGERTIPS 3 TIMES A DAY  . NON FORMULARY Respivest: Three times a day for 30 minutes  . ondansetron (ZOFRAN-ODT) 4 MG disintegrating tablet DISSOLVE 1 TABLET(4 MG) ON THE TONGUE EVERY 8 HOURS AS NEEDED FOR NAUSEA OR VOMITING  . Respiratory Therapy Supplies (FLUTTER) DEVI 1 Device by Does not apply route as directed.  . sodium chloride HYPERTONIC 3 % nebulizer solution USE 1 VIAL VIA NEBULIZER TWICE A DAY.  Marland Kitchen triamcinolone cream (KENALOG) 0.1 % Apply 1 application topically daily as needed (for itching of affected areas).   Marland Kitchen UNABLE TO FIND 3 (three) times daily. Med Name: Refresh Relivia eyedrops   No facility-administered encounter medications on file as of 07/12/2020.    Current Diagnosis: Patient Active Problem List   Diagnosis Date Noted  . Unilateral primary osteoarthritis, right hip 04/27/2020  . COPD mixed type ([place]) 12/26/2018  . Healthcare maintenance 12/26/2018  . History of fall 11/05/2018  . Weight loss 11/05/2018  . Weakness 11/05/2018  . Gangrene of finger ([place]) 10/09/2018  . Raynaud's phenomenon with gangrene ([place]) 10/09/2018  . LVH (left ventricular hypertrophy) 10/09/2018  . Vasospasm ([place]) 09/06/2018  . Hypotension 08/08/2018  . Leucocytosis 08/08/2018  . Elevated troponin I level 08/08/2018  . Nausea and vomiting 08/06/2018  . Pneumonia 07/12/2018  . COPD exacerbation ([place]) 07/11/2018  . Primary osteoarthritis of both feet 12/21/2017  . DDD (degenerative disc disease), lumbar 12/21/2017  . Primary osteoarthritis of both knees 12/21/2017  . History of bilateral carpal tunnel release 12/21/2017  . DDD (degenerative disc disease), cervical 11/15/2017  . Former smoker 11/15/2017  . Bronchiectasis without complication ([place]) 35/57/3220  . Impingement syndrome of right shoulder 07/25/2017  . Hx of fusion of  cervical spine 07/25/2017  . Cervical spinal stenosis 09/27/2016  . Polypharmacy 06/23/2015  . Hyperlipidemia 04/19/2015  . Visit for preventive health examination 01/01/2015  . Primary osteoarthritis involving multiple joints 01/01/2015  . Bipolar affective disorder, currently depressed, moderate ([place])   . Bipolar I disorder with mania ([place]) 08/17/2014  . Hypertension 10/21/2013  . Dizziness 03/25/2013  . Medication withdrawal ([place]) 03/05/2013  . Diabetes mellitus with renal manifestations, controlled ([place]) 11/10/2012  . Alopecia areata 02/06/2012  . Obesity (BMI 30-39.9) 02/06/2012  . Renal insufficiency 07/16/2011  . Neuroleptic-induced tardive dyskinesia   . Exertional dyspnea 02/07/2011  . Dyspnea on exertion 01/19/2011  . DM (diabetes mellitus), type 2 ([place]) 04/22/2010  . Carpal tunnel syndrome 02/02/2010  . CONSTIPATION 02/02/2010  . Primary osteoarthritis of both hands 02/02/2010  . CATARACTS 04/13/2009  . PAIN IN JOINT, ANKLE AND FOOT 09/16/2008  . Eosinophilia 07/21/2008  . AFFECTIVE DISORDER 04/21/2008  . BACK PAIN, CHRONIC 02/12/2008  . LEG PAIN 02/12/2008  . ABNORMAL INVOLUNTARY MOVEMENTS 12/04/2007  . POSTURAL LIGHTHEADEDNESS 11/04/2007  . COLONIC POLYPS, HX OF 11/04/2007  . Essential hypertension 06/17/2007  . HYPERLIPIDEMIA 04/08/2007  . MITRAL VALVE PROLAPSE 04/08/2007  . GERD 04/08/2007  . LOW BACK PAIN SYNDROME 04/08/2007  . CHEST PAIN,  RECURRENT 04/08/2007    Goals Addressed   None    Received call from patient regarding medication management via [facility].  Patient requested an acute fill for Lines 145 mcg capsule ( take one by mouth daily as needed) to be delivered: 07-13-2020 Pharmacy needs refills? Yes  Confirmed delivery date of 07-13-2020, advised patient that pharmacy will contact them the morning of delivery.   Follow-Up:  Coordination of [facility] Pharm[facility] Services  Amilia (Rico) Mare Ferrari, [place]  Assistant [phone]

## 2020-07-13 NOTE — Telephone Encounter (Signed)
Paperwork placed in red folder. Please return once complete. Thank you! 

## 2020-07-14 NOTE — Telephone Encounter (Signed)
Faxed to [facility] and received confirmation that the fax went through.

## 2020-07-14 NOTE — Telephone Encounter (Signed)
I think I signed it    Ok to sign if I didn't

## 2020-07-30 ENCOUNTER — Telehealth: Payer: Self-pay | Admitting: Internal Medicine

## 2020-07-30 MED ORDER — ACCU-CHEK NANO SMARTVIEW W/DEVICE KIT
PACK | 0 refills | Status: DC
Start: 2020-07-30 — End: 2020-08-02

## 2020-07-30 NOTE — Telephone Encounter (Signed)
Spoke with the pt to confirm she requested the Accu chek Nano Smartview glucometer.  Patient confirmed this and I advised her the Rx was initially sent on 9/17 and re-sent to [facility].

## 2020-07-30 NOTE — Telephone Encounter (Signed)
Pt needs a call back- she states it's in reference to her glucose monitor.  [employer] said they never got a call back to be able to get a meter.  [employer] #: [phone]  Fax #: [phone]

## 2020-08-02 ENCOUNTER — Other Ambulatory Visit: Payer: Self-pay

## 2020-08-02 MED ORDER — ACCU-CHEK SOFTCLIX LANCETS MISC: 12 refills | Status: DC

## 2020-08-02 MED ORDER — ACCU-CHEK AVIVA PLUS VI STRP: ORAL_STRIP | 12 refills | Status: DC

## 2020-08-02 MED ORDER — ACCU-CHEK AVIVA PLUS W/DEVICE KIT: PACK | 0 refills | Status: DC

## 2020-08-03 ENCOUNTER — Encounter: Payer: Self-pay | Admitting: Pharmacist

## 2020-08-03 ENCOUNTER — Encounter: Payer: Self-pay | Admitting: Critical Care Medicine

## 2020-08-03 ENCOUNTER — Ambulatory Visit: Payer: Medicare HMO | Admitting: Critical Care Medicine

## 2020-08-03 ENCOUNTER — Other Ambulatory Visit: Payer: Self-pay

## 2020-08-03 VITALS — BP 100/60 | HR 95 | Temp 97.3°F | Ht 62.5 in | Wt 150.6 lb

## 2020-08-03 DIAGNOSIS — J479 Bronchiectasis, uncomplicated: Secondary | ICD-10-CM

## 2020-08-03 DIAGNOSIS — J449 Chronic obstructive pulmonary disease, unspecified: Secondary | ICD-10-CM

## 2020-08-03 NOTE — Patient Instructions (Addendum)
Thank you for visiting Dr. Chestine Spore at [facility]. We recommend the following:  Keep taking Bevespi 2 times per day.    Return in about 6 months (around 02/01/2021). with Dr. Judeth Horn (30 minutes visit).    Please do your part to reduce the spread of COVID-19.
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
## 2020-08-04 ENCOUNTER — Other Ambulatory Visit (HOSPITAL_COMMUNITY): Payer: Self-pay | Admitting: Internal Medicine

## 2020-08-04 ENCOUNTER — Ambulatory Visit: Payer: Medicare HMO | Attending: Internal Medicine

## 2020-08-04 DIAGNOSIS — Z23 Encounter for immunization: Secondary | ICD-10-CM

## 2020-08-04 NOTE — Progress Notes (Signed)
   Covid-19 Vaccination Clinic  Name:  Desiree [name]    MRN: [id number] DOB: May 27, 1951  08/04/2020  Desiree [name] was observed post Covid-19 immunization for 15 minutes without incident. She was provided with Vaccine Information Sheet and instruction to access the V-Safe system.   Desiree [name] was instructed to call 911 with any severe reactions post vaccine: Marland Kitchen Difficulty breathing  . Swelling of face and throat  . A fast heartbeat  . A bad rash all over body  . Dizziness and weakness

## 2020-08-05 NOTE — Chronic Care Management (AMB) (Signed)
Error

## 2020-08-09 ENCOUNTER — Telehealth: Payer: Self-pay | Admitting: Pharmacist

## 2020-08-09 DIAGNOSIS — I10 Essential (primary) hypertension: Secondary | ICD-10-CM

## 2020-08-09 NOTE — Chronic Care Management (AMB) (Signed)
I left the patient a message about her upcoming appointment on 08/10/2020 @ 1:00 pm with the clinical pharmacist. She was asked to please have all medication on had to review the pharmacist.

## 2020-08-10 ENCOUNTER — Ambulatory Visit: Payer: Medicare HMO | Admitting: Pharmacist

## 2020-08-10 DIAGNOSIS — E1122 Type 2 diabetes mellitus with diabetic chronic kidney disease: Secondary | ICD-10-CM

## 2020-08-10 DIAGNOSIS — I10 Essential (primary) hypertension: Secondary | ICD-10-CM

## 2020-08-10 MED ORDER — BD ASSURE BPM/AUTO ARM CUFF MISC: 0 refills | Status: DC
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
## 2020-08-10 NOTE — Addendum Note (Signed)
Addended by: Radford Pax M on: 08/10/2020 04:45 PM   Modules accepted: Orders

## 2020-08-12 ENCOUNTER — Institutional Professional Consult (permissible substitution): Payer: Medicare HMO | Admitting: Neurology

## 2020-08-19 ENCOUNTER — Encounter: Payer: Self-pay | Admitting: Pharmacist

## 2020-08-19 ENCOUNTER — Other Ambulatory Visit: Payer: Self-pay

## 2020-08-19 ENCOUNTER — Encounter: Payer: Self-pay | Admitting: Rheumatology

## 2020-08-19 ENCOUNTER — Ambulatory Visit (INDEPENDENT_AMBULATORY_CARE_PROVIDER_SITE_OTHER): Payer: Medicare HMO | Admitting: Rheumatology

## 2020-08-19 VITALS — BP 125/76 | HR 90 | Ht 62.5 in | Wt 152.0 lb

## 2020-08-19 DIAGNOSIS — M19041 Primary osteoarthritis, right hand: Secondary | ICD-10-CM

## 2020-08-19 DIAGNOSIS — I7301 Raynaud's syndrome with gangrene: Secondary | ICD-10-CM

## 2020-08-19 DIAGNOSIS — M19072 Primary osteoarthritis, left ankle and foot: Secondary | ICD-10-CM

## 2020-08-19 DIAGNOSIS — I10 Essential (primary) hypertension: Secondary | ICD-10-CM

## 2020-08-19 DIAGNOSIS — Z79899 Other long term (current) drug therapy: Secondary | ICD-10-CM | POA: Diagnosis not present

## 2020-08-19 DIAGNOSIS — J479 Bronchiectasis, uncomplicated: Secondary | ICD-10-CM

## 2020-08-19 DIAGNOSIS — M170 Bilateral primary osteoarthritis of knee: Secondary | ICD-10-CM | POA: Diagnosis not present

## 2020-08-19 DIAGNOSIS — Z8639 Personal history of other endocrine, nutritional and metabolic disease: Secondary | ICD-10-CM

## 2020-08-19 DIAGNOSIS — Z7189 Other specified counseling: Secondary | ICD-10-CM

## 2020-08-19 DIAGNOSIS — M5136 Other intervertebral disc degeneration, lumbar region: Secondary | ICD-10-CM

## 2020-08-19 DIAGNOSIS — M1611 Unilateral primary osteoarthritis, right hip: Secondary | ICD-10-CM | POA: Diagnosis not present

## 2020-08-19 DIAGNOSIS — R296 Repeated falls: Secondary | ICD-10-CM

## 2020-08-19 DIAGNOSIS — F319 Bipolar disorder, unspecified: Secondary | ICD-10-CM | POA: Diagnosis not present

## 2020-08-19 DIAGNOSIS — Z87891 Personal history of nicotine dependence: Secondary | ICD-10-CM

## 2020-08-19 DIAGNOSIS — M7541 Impingement syndrome of right shoulder: Secondary | ICD-10-CM

## 2020-08-19 DIAGNOSIS — E782 Mixed hyperlipidemia: Secondary | ICD-10-CM

## 2020-08-19 DIAGNOSIS — M5030 Other cervical disc degeneration, unspecified cervical region: Secondary | ICD-10-CM

## 2020-08-19 DIAGNOSIS — M51369 Other intervertebral disc degeneration, lumbar region without mention of lumbar back pain or lower extremity pain: Secondary | ICD-10-CM

## 2020-08-19 DIAGNOSIS — R768 Other specified abnormal immunological findings in serum: Secondary | ICD-10-CM | POA: Diagnosis not present

## 2020-08-19 DIAGNOSIS — M19042 Primary osteoarthritis, left hand: Secondary | ICD-10-CM

## 2020-08-19 DIAGNOSIS — M19071 Primary osteoarthritis, right ankle and foot: Secondary | ICD-10-CM | POA: Diagnosis not present

## 2020-08-19 DIAGNOSIS — Z8659 Personal history of other mental and behavioral disorders: Secondary | ICD-10-CM
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
## 2020-08-19 NOTE — Chronic Care Management (AMB) (Signed)
Error

## 2020-08-24 ENCOUNTER — Ambulatory Visit: Payer: Medicare HMO | Admitting: Orthopaedic Surgery

## 2020-08-24 ENCOUNTER — Ambulatory Visit (INDEPENDENT_AMBULATORY_CARE_PROVIDER_SITE_OTHER): Payer: Medicare HMO

## 2020-08-24 ENCOUNTER — Encounter: Payer: Self-pay | Admitting: Orthopaedic Surgery

## 2020-08-24 ENCOUNTER — Other Ambulatory Visit: Payer: Self-pay

## 2020-08-24 VITALS — BP 134/73 | HR 66 | Ht 62.5 in | Wt 152.0 lb

## 2020-08-24 DIAGNOSIS — M79644 Pain in right finger(s): Secondary | ICD-10-CM | POA: Diagnosis not present

## 2020-08-24 MED ORDER — CIPROFLOXACIN HCL 500 MG PO TABS: 500.0000 mg | ORAL_TABLET | Freq: Two times a day (BID) | ORAL | 0 refills | Status: DC

## 2020-08-24 NOTE — Progress Notes (Signed)
Office Visit Note   Patient: Desiree [name]           Date of Birth: August 10, 1951           MRN: [id number] Visit Date: 08/24/2020              Requested by: Madelin Headings, MD [address] [place],  [place] [address] PCP: Madelin Headings, MD   Assessment & Plan: Visit Diagnoses:  1. Pain of finger of right hand     Plan: Patient has  paronychial infection.  She is allergic to penicillin.  We will send in some Cipro 500 mg p.o. twice daily x10 days.  Recheck 1 week.  Follow-Up Instructions: No follow-ups on file.   Orders:  Orders Placed This Encounter  Procedures  . XR Finger Little Right   No orders of the defined types were placed in this encounter.     Procedures: No procedures performed   Clinical Data: No additional findings.   Subjective: Chief Complaint  Patient presents with  . Right Little Finger - Pain    HPI 69 year old female seen with right fifth finger.  I feel infection on the ulnar aspect.  She had some greenish discoloration and mild drainage she states has been painful.  She originally fell off the bed prior to 10/19.  Past history of Raynaud's and with infection at the tip and she had amputation done by [facility] long fingertip.  She is not been on any antibiotics.  Problems with the bipolar disorder previous cervical fusion.  Review of Systems 14 point system update unchanged from 04/27/2020.   Objective: Vital Signs: BP 134/73   Pulse 66   Ht 5' 2.5" (1.588 m)   Wt 152 lb (68.9 kg)   BMI 27.36 kg/m   Physical Exam Constitutional:      Appearance: She is well-developed.  HENT:     Head: Normocephalic.     Right Ear: External ear normal.     Left Ear: External ear normal.  Eyes:     Pupils: Pupils are equal, round, and reactive to light.  Neck:     Thyroid: No thyromegaly.     Trachea: No tracheal deviation.  Cardiovascular:     Rate and Rhythm: Normal rate.  Pulmonary:     Effort: Pulmonary effort is normal.  Abdominal:       Palpations: Abdomen is soft.  Skin:    General: Skin is warm and dry.  Neurological:     Mental Status: She is alert and oriented to person, place, and time.  Psychiatric:        Behavior: Behavior normal.     Ortho Exam patient has some tenderness of the paronychia along the ulnar aspect fifth finger with slight greenish discoloration without active drainage.  Nailbed itself appears normal.  Sensation of the fingertip is intact.  Good capillary refill.  Specialty Comments:  No specialty comments available.  Imaging: No results found.   PMFS History: Patient Active Problem List   Diagnosis Date Noted  . Unilateral primary osteoarthritis, right hip 04/27/2020  . COPD mixed type (HCC) 12/26/2018  . Healthcare maintenance 12/26/2018  . History of fall 11/05/2018  . Weight loss 11/05/2018  . Weakness 11/05/2018  . Gangrene of finger (HCC) 10/09/2018  . Raynaud's phenomenon with gangrene (HCC) 10/09/2018  . LVH (left ventricular hypertrophy) 10/09/2018  . Vasospasm (HCC) 09/06/2018  . Hypotension 08/08/2018  . Leucocytosis 08/08/2018  . Elevated troponin I level 08/08/2018  .  Nausea and vomiting 08/06/2018  . Pneumonia 07/12/2018  . COPD exacerbation (HCC) 07/11/2018  . Primary osteoarthritis of both feet 12/21/2017  . DDD (degenerative disc disease), lumbar 12/21/2017  . Primary osteoarthritis of both knees 12/21/2017  . History of bilateral carpal tunnel release 12/21/2017  . DDD (degenerative disc disease), cervical 11/15/2017  . Former smoker 11/15/2017  . Bronchiectasis without complication (HCC) 10/25/2017  . Impingement syndrome of right shoulder 07/25/2017  . Hx of fusion of cervical spine 07/25/2017  . Cervical spinal stenosis 09/27/2016  . Polypharmacy 06/23/2015  . Hyperlipidemia 04/19/2015  . Visit for preventive health examination 01/01/2015  . Primary osteoarthritis involving multiple joints 01/01/2015  . Bipolar affective disorder, currently  depressed, moderate (HCC)   . Bipolar I disorder with mania (HCC) 08/17/2014  . Hypertension 10/21/2013  . Dizziness 03/25/2013  . Medication withdrawal (HCC) 03/05/2013  . Diabetes mellitus with renal manifestations, controlled (HCC) 11/10/2012  . Alopecia areata 02/06/2012  . Obesity (BMI 30-39.9) 02/06/2012  . Renal insufficiency 07/16/2011  . Neuroleptic-induced tardive dyskinesia   . Exertional dyspnea 02/07/2011  . Dyspnea on exertion 01/19/2011  . DM (diabetes mellitus), type 2 (HCC) 04/22/2010  . Carpal tunnel syndrome 02/02/2010  . CONSTIPATION 02/02/2010  . Primary osteoarthritis of both hands 02/02/2010  . CATARACTS 04/13/2009  . PAIN IN JOINT, ANKLE AND FOOT 09/16/2008  . Eosinophilia 07/21/2008  . AFFECTIVE DISORDER 04/21/2008  . BACK PAIN, CHRONIC 02/12/2008  . LEG PAIN 02/12/2008  . ABNORMAL INVOLUNTARY MOVEMENTS 12/04/2007  . POSTURAL LIGHTHEADEDNESS 11/04/2007  . COLONIC POLYPS, HX OF 11/04/2007  . Essential hypertension 06/17/2007  . HYPERLIPIDEMIA 04/08/2007  . MITRAL VALVE PROLAPSE 04/08/2007  . GERD 04/08/2007  . LOW BACK PAIN SYNDROME 04/08/2007  . CHEST PAIN, RECURRENT 04/08/2007   Past Medical History:  Diagnosis Date  . Acute gastritis without bleeding 08/08/2018  . Anxiety   . Bipolar disorder (HCC)   . CANDIDIASIS, ESOPHAGEAL 07/28/2009   Qualifier: Diagnosis of  By: Fabian Sharp MD, Neta Mends   . Chronic kidney disease    CKD III  . Complication of anesthesia    was told she stopped breathing for one of her finger surgeries  . COPD (chronic obstructive pulmonary disease) (HCC)   . Depression   . Diabetes mellitus without complication (HCC)    no meds  . FH: colonic polyps   . Fractured elbow    right   . GERD (gastroesophageal reflux disease)   . HH (hiatus hernia)   . History of carpal tunnel syndrome   . History of chest pain   . History of transfusion of packed red blood cells   . Hyperlipidemia   . Hypertension   . Neuroleptic-induced  tardive dyskinesia   . Osteoarthritis of more than one site   . Seasonal allergies     Family History  Problem Relation Age of Onset  . Heart attack Father   . Heart disease Father   . Throat cancer Brother   . Diabetes Mother   . Hypertension Mother   . Heart attack Mother   . Diabetes type II Brother   . Heart disease Brother   . Lung cancer Brother   . Breast cancer Cousin   . Lung cancer Daughter   . Lung cancer Paternal Uncle     Past Surgical History:  Procedure Laterality Date  . AMPUTATION Left 10/14/2018   Procedure: AMPUTATION LEFT LONG FINGER TIP;  Surgeon: Knute Neu, MD;  Location: [facility] OR;  Service: Plastics;  Laterality:  Left;  . ANTERIOR CERVICAL DECOMP/DISCECTOMY FUSION  09/27/2016   C5-6 anterior cervical discectomy and fusion, allograft and plate/notes 87/02/6432  . ANTERIOR CERVICAL DECOMP/DISCECTOMY FUSION N/A 09/27/2016   Procedure: C5-6 Anterior Cervical Discectomy and Fusion, Allograft and Plate;  Surgeon: Eldred Manges, MD;  Location: [facility] OR;  Service: Orthopedics;  Laterality: N/A;  . Back Fusion  2002  . BIOPSY  07/19/2018   Procedure: BIOPSY;  Surgeon: Jeani Hawking, MD;  Location: [facility] ENDOSCOPY;  Service: Endoscopy;;  . BIOPSY  02/13/2019   Procedure: BIOPSY;  Surgeon: Jeani Hawking, MD;  Location: [facility] ENDOSCOPY;  Service: Endoscopy;;  . CARPAL TUNNEL RELEASE  [employer]   left  . COLONOSCOPY N/A 01/05/2014   Procedure: COLONOSCOPY;  Surgeon: Charna Elizabeth, MD;  Location: [facility] ENDOSCOPY;  Service: Endoscopy;  Laterality: N/A;  . ELBOW SURGERY     age 87  . ENTEROSCOPY N/A 02/13/2019   Procedure: ENTEROSCOPY;  Surgeon: Jeani Hawking, MD;  Location: [facility] ENDOSCOPY;  Service: Endoscopy;  Laterality: N/A;  . ESOPHAGOGASTRODUODENOSCOPY (EGD) WITH PROPOFOL N/A 07/19/2018   Procedure: ESOPHAGOGASTRODUODENOSCOPY (EGD) WITH PROPOFOL;  Surgeon: Jeani Hawking, MD;  Location: [facility] ENDOSCOPY;  Service: Endoscopy;  Laterality: N/A;  . EXTERNAL EAR SURGERY Left   . EYE SURGERY      "removed white dots under eyelid"  . FINGER SURGERY Left   . Juvara osteomy    . KNEE SURGERY    . NOSE SURGERY    . Rt. toe bunion    . skin, shave biopsy  05/03/2016   Left occipital scalp, top of scalp  . UPPER EXTREMITY ANGIOGRAPHY Bilateral 10/11/2018   Procedure: UPPER EXTREMITY ANGIOGRAPHY;  Surgeon: Sherren Kerns, MD;  Location: [facility] INVASIVE CV LAB;  Service: Cardiovascular;  Laterality: Bilateral;   Social History   Occupational History  . Not on file  Tobacco Use  . Smoking status: Former Smoker    Packs/day: 2.00    Years: 30.00    Pack years: 60.00    Types: Cigarettes    Quit date: 10/23/2001    Years since quitting: 18.8  . Smokeless tobacco: Never Used  Vaping Use  . Vaping Use: Never used  Substance and Sexual Activity  . Alcohol use: No  . Drug use: No  . Sexual activity: Not on file

## 2020-08-31 ENCOUNTER — Ambulatory Visit: Payer: Medicare HMO | Admitting: Orthopaedic Surgery

## 2020-08-31 ENCOUNTER — Encounter: Payer: Self-pay | Admitting: Orthopaedic Surgery

## 2020-08-31 ENCOUNTER — Other Ambulatory Visit: Payer: Self-pay

## 2020-08-31 ENCOUNTER — Ambulatory Visit (INDEPENDENT_AMBULATORY_CARE_PROVIDER_SITE_OTHER): Payer: Medicare HMO

## 2020-08-31 VITALS — Ht 62.5 in | Wt 152.0 lb

## 2020-08-31 DIAGNOSIS — M25531 Pain in right wrist: Secondary | ICD-10-CM

## 2020-08-31 DIAGNOSIS — M67431 Ganglion, right wrist: Secondary | ICD-10-CM | POA: Diagnosis not present

## 2020-08-31 DIAGNOSIS — M79644 Pain in right finger(s): Secondary | ICD-10-CM | POA: Diagnosis not present

## 2020-08-31 MED ORDER — CIPROFLOXACIN HCL 500 MG PO TABS
500.0000 mg | ORAL_TABLET | Freq: Two times a day (BID) | ORAL | 0 refills | Status: DC
Start: 2020-08-31 — End: 2020-11-25

## 2020-08-31 NOTE — Patient Outreach (Signed)
[facility]) Care Management  08/31/2020  Desiree [name] 02/11/1951 [id number]   Telephone call to patient for disease management follow up.   No answer.  HIPAA compliant voice message left.    Plan: If no return call, RN CM will attempt patient again in February.  Bary Leriche, RN, MSN [facility] Care Management Care Management Coordinator Direct Line [phone] Cell [phone] Toll Free: [phone]  Fax: [phone]

## 2020-08-31 NOTE — Progress Notes (Signed)
Office Visit Note   Patient: Desiree [name]           Date of Birth: 04-05-1951           MRN: [id number] Visit Date: 08/31/2020              Requested by: Madelin Headings, MD [address] [place],  [place] [address] PCP: Madelin Headings, MD   Assessment & Plan: Visit Diagnoses:  1. Pain in right wrist   2. Pain in right finger(s)   3. Ganglion of right wrist     Plan: Renewal Cipro recheck 2 weeks if she is having persistent symptoms we discussed draining the open I came under local anesthesia.  We also can aspirate the wrist ganglion if it is still bothering her.  Follow-Up Instructions: Return in about 2 weeks (around 09/14/2020).   Orders:  Orders Placed This Encounter  Procedures  . XR Wrist Complete Right   Meds ordered this encounter  Medications  . ciprofloxacin (CIPRO) 500 MG tablet    Sig: Take 1 tablet (500 mg total) by mouth 2 (two) times daily.    Dispense:  20 tablet    Refill:  0      Procedures: No procedures performed   Clinical Data: No additional findings.   Subjective: Chief Complaint  Patient presents with  . Right Little Finger - Follow-up  . Right Wrist - Pain    HPI 69 year old female returns with epimacular infection right small finger.  She has been on Cipro due to allergies and states it is better not as tender.  She has not had any drainage she still has some tenderness and some prominence which may suggest she has some purulence remaining.  She also had an episode where she fell out of bed landed on the hard floor.  She noted no change in the ulnar wrist ganglion distally ulnar styloid.  She is concerned she might have damaged the wrist and two x-rays were obtained at her request today.  Review of Systems other systems updated unchanged from last week visit.   Objective: Vital Signs: Ht 5' 2.5" (1.588 m)   Wt 152 lb (68.9 kg)   BMI 27.36 kg/m   Physical Exam Constitutional:      Appearance: She is well-developed.    HENT:     Head: Normocephalic.     Right Ear: External ear normal.     Left Ear: External ear normal.  Eyes:     Pupils: Pupils are equal, round, and reactive to light.  Neck:     Thyroid: No thyromegaly.     Trachea: No tracheal deviation.  Cardiovascular:     Rate and Rhythm: Normal rate.  Pulmonary:     Effort: Pulmonary effort is normal.  Abdominal:     Palpations: Abdomen is soft.  Skin:    General: Skin is warm and dry.  Neurological:     Mental Status: She is alert and oriented to person, place, and time.  Psychiatric:        Behavior: Behavior normal.     Ortho Exam patient has residual tenderness upon ICAM along the ulnar aspect of the right small finger.  1.52cm soft tissue swelling palpable consistent with ganglion over the sixth dorsal compartment distal to the ulnar styloid region.  Ulnar sensation fingertips is intact.  Specialty Comments:  No specialty comments available.  Imaging: No results found.   PMFS History: Patient Active Problem List   Diagnosis  Date Noted  . Pain in right finger(s) 08/31/2020  . Ganglion of right wrist 08/31/2020  . Unilateral primary osteoarthritis, right hip 04/27/2020  . COPD mixed type (HCC) 12/26/2018  . Healthcare maintenance 12/26/2018  . History of fall 11/05/2018  . Weight loss 11/05/2018  . Weakness 11/05/2018  . Gangrene of finger (HCC) 10/09/2018  . Raynaud's phenomenon with gangrene (HCC) 10/09/2018  . LVH (left ventricular hypertrophy) 10/09/2018  . Vasospasm (HCC) 09/06/2018  . Hypotension 08/08/2018  . Leucocytosis 08/08/2018  . Elevated troponin I level 08/08/2018  . Nausea and vomiting 08/06/2018  . Pneumonia 07/12/2018  . COPD exacerbation (HCC) 07/11/2018  . Primary osteoarthritis of both feet 12/21/2017  . DDD (degenerative disc disease), lumbar 12/21/2017  . Primary osteoarthritis of both knees 12/21/2017  . History of bilateral carpal tunnel release 12/21/2017  . DDD (degenerative disc  disease), cervical 11/15/2017  . Former smoker 11/15/2017  . Bronchiectasis without complication (HCC) 10/25/2017  . Impingement syndrome of right shoulder 07/25/2017  . Hx of fusion of cervical spine 07/25/2017  . Cervical spinal stenosis 09/27/2016  . Polypharmacy 06/23/2015  . Hyperlipidemia 04/19/2015  . Visit for preventive health examination 01/01/2015  . Primary osteoarthritis involving multiple joints 01/01/2015  . Bipolar affective disorder, currently depressed, moderate (HCC)   . Bipolar I disorder with mania (HCC) 08/17/2014  . Hypertension 10/21/2013  . Dizziness 03/25/2013  . Medication withdrawal (HCC) 03/05/2013  . Diabetes mellitus with renal manifestations, controlled (HCC) 11/10/2012  . Alopecia areata 02/06/2012  . Obesity (BMI 30-39.9) 02/06/2012  . Renal insufficiency 07/16/2011  . Neuroleptic-induced tardive dyskinesia   . Exertional dyspnea 02/07/2011  . Dyspnea on exertion 01/19/2011  . DM (diabetes mellitus), type 2 (HCC) 04/22/2010  . Carpal tunnel syndrome 02/02/2010  . CONSTIPATION 02/02/2010  . Primary osteoarthritis of both hands 02/02/2010  . CATARACTS 04/13/2009  . PAIN IN JOINT, ANKLE AND FOOT 09/16/2008  . Eosinophilia 07/21/2008  . AFFECTIVE DISORDER 04/21/2008  . BACK PAIN, CHRONIC 02/12/2008  . LEG PAIN 02/12/2008  . ABNORMAL INVOLUNTARY MOVEMENTS 12/04/2007  . POSTURAL LIGHTHEADEDNESS 11/04/2007  . COLONIC POLYPS, HX OF 11/04/2007  . Essential hypertension 06/17/2007  . HYPERLIPIDEMIA 04/08/2007  . MITRAL VALVE PROLAPSE 04/08/2007  . GERD 04/08/2007  . LOW BACK PAIN SYNDROME 04/08/2007  . CHEST PAIN, RECURRENT 04/08/2007   Past Medical History:  Diagnosis Date  . Acute gastritis without bleeding 08/08/2018  . Anxiety   . Bipolar disorder (HCC)   . CANDIDIASIS, ESOPHAGEAL 07/28/2009   Qualifier: Diagnosis of  By: Fabian Sharp MD, Neta Mends   . Chronic kidney disease    CKD III  . Complication of anesthesia    was told she stopped  breathing for one of her finger surgeries  . COPD (chronic obstructive pulmonary disease) (HCC)   . Depression   . Diabetes mellitus without complication (HCC)    no meds  . FH: colonic polyps   . Fractured elbow    right   . GERD (gastroesophageal reflux disease)   . HH (hiatus hernia)   . History of carpal tunnel syndrome   . History of chest pain   . History of transfusion of packed red blood cells   . Hyperlipidemia   . Hypertension   . Neuroleptic-induced tardive dyskinesia   . Osteoarthritis of more than one site   . Seasonal allergies     Family History  Problem Relation Age of Onset  . Heart attack Father   . Heart disease Father   .  Throat cancer Brother   . Diabetes Mother   . Hypertension Mother   . Heart attack Mother   . Diabetes type II Brother   . Heart disease Brother   . Lung cancer Brother   . Breast cancer Cousin   . Lung cancer Daughter   . Lung cancer Paternal Uncle     Past Surgical History:  Procedure Laterality Date  . AMPUTATION Left 10/14/2018   Procedure: AMPUTATION LEFT LONG FINGER TIP;  Surgeon: Knute Neu, MD;  Location: [facility] OR;  Service: Plastics;  Laterality: Left;  . ANTERIOR CERVICAL DECOMP/DISCECTOMY FUSION  09/27/2016   C5-6 anterior cervical discectomy and fusion, allograft and plate/notes 82/04/785  . ANTERIOR CERVICAL DECOMP/DISCECTOMY FUSION N/A 09/27/2016   Procedure: C5-6 Anterior Cervical Discectomy and Fusion, Allograft and Plate;  Surgeon: Eldred Manges, MD;  Location: [facility] OR;  Service: Orthopedics;  Laterality: N/A;  . Back Fusion  2002  . BIOPSY  07/19/2018   Procedure: BIOPSY;  Surgeon: Jeani Hawking, MD;  Location: [facility] ENDOSCOPY;  Service: Endoscopy;;  . BIOPSY  02/13/2019   Procedure: BIOPSY;  Surgeon: Jeani Hawking, MD;  Location: [facility] ENDOSCOPY;  Service: Endoscopy;;  . CARPAL TUNNEL RELEASE  [place]   left  . COLONOSCOPY N/A 01/05/2014   Procedure: COLONOSCOPY;  Surgeon: Charna Elizabeth, MD;  Location: [facility] ENDOSCOPY;  Service:  Endoscopy;  Laterality: N/A;  . ELBOW SURGERY     age 81  . ENTEROSCOPY N/A 02/13/2019   Procedure: ENTEROSCOPY;  Surgeon: Jeani Hawking, MD;  Location: [facility] ENDOSCOPY;  Service: Endoscopy;  Laterality: N/A;  . ESOPHAGOGASTRODUODENOSCOPY (EGD) WITH PROPOFOL N/A 07/19/2018   Procedure: ESOPHAGOGASTRODUODENOSCOPY (EGD) WITH PROPOFOL;  Surgeon: Jeani Hawking, MD;  Location[facility] ENDOSCOPY;  Service: Endoscopy;  Laterality: N/A;  . EXTERNAL EAR SURGERY Left   . EYE SURGERY     "removed white dots under eyelid"  . FINGER SURGERY Left   . Juvara osteomy    . KNEE SURGERY    . NOSE SURGERY    . Rt. toe bunion    . skin, shave biopsy  05/03/2016   Left occipital scalp, top of scalp  . UPPER EXTREMITY ANGIOGRAPHY Bilateral 10/11/2018   Procedure: UPPER EXTREMITY ANGIOGRAPHY;  Surgeon: Sherren Kerns, MD;  Location: [facility] INVASIVE CV LAB;  Service: Cardiovascular;  Laterality: Bilateral;   Social History   Occupational History  . Not on file  Tobacco Use  . Smoking status: Former Smoker    Packs/day: 2.00    Years: 30.00    Pack years: 60.00    Types: Cigarettes    Quit date: 10/23/2001    Years since quitting: 18.8  . Smokeless tobacco: Never Used  Vaping Use  . Vaping Use: Never used  Substance and Sexual Activity  . Alcohol use: No  . Drug use: No  . Sexual activity: Not on file

## 2020-09-01 ENCOUNTER — Ambulatory Visit: Payer: Medicare HMO | Admitting: Rheumatology

## 2020-09-01 ENCOUNTER — Telehealth: Payer: Self-pay | Admitting: Pharmacist

## 2020-09-01 ENCOUNTER — Telehealth: Payer: Self-pay | Admitting: Internal Medicine

## 2020-09-01 NOTE — Chronic Care Management (AMB) (Addendum)
Chronic Care Management Pharmacy Assistant   Name: Desiree [name]  MRN: [id number] DOB: [birth year]  Reason for Encounter: Medication Review  Patient Questions: 1. Have you seen any other providers since your last visit? Yes   08-31-2020 [name], MD Orthopedic Surgery i. ciprofloxacin (CIPRO) 500 MG tablet: Take 1 tablet (500 mg total) by mouth 2 (two) times daily.  08-19-2020 Bo Merino, MD Rheumatology  2.  Any changes in your medicines or health? Yes  PCP : [name], MD  Allergies:   Allergies  Allergen Reactions   Prednisone Shortness Of Breath, Itching, Nausea And Vomiting and Palpitations   Solu-Medrol [Methylprednisolone] Anaphylaxis   Amoxicillin Hives and Rash    Has patient had a PCN reaction causing immediate rash, facial/tongue/throat swelling, SOB or lightheadedness with hypotension:Yes Has patient had a PCN reaction causing severe rash involving mucus membranes or skin necrosis: No Has patient had a PCN reaction that required hospitalization: No Has patient had a PCN reaction occurring within the last 10 years: No If all of the above answers are "NO", then may proceed with Cephalosporin use.    Codeine Hives, Itching and Rash    Tolerated oxycodone and morphine previously   Hydrocodone-Acetaminophen Hives, Itching and Rash    Tolerated oxycodone and morphine previously   Penicillins Hives, Itching and Rash    ALLERGIC REACTION TO ORAL AMOXICILLIN Has patient had a PCN reaction causing immediate rash, facial/tongue/throat swelling, SOB or lightheadedness with hypotension: Yes Has patient had a PCN reaction causing severe rash involving mucus membranes or skin necrosis: No Has patient had a PCN reaction that required hospitalization: No Has patient had a PCN reaction occurring within the last 10 years: No If all of the above answers are "NO", then may proceed with Cephalosporin use.   Tape Other (See Comments)    sore     Medications: Outpatient Encounter Medications as of 09/01/2020  Medication Sig   Accu-Chek Softclix Lancets lancets Use to test blood sugar daily. Dx:e11.9   acetaminophen (TYLENOL) 500 MG tablet Take 2 tablets (1,000 mg total) by mouth every 6 (six) hours as needed. (Patient taking differently: Take 1,000 mg by mouth every 6 (six) hours as needed for mild pain or headache. )   Alcohol Swabs (B-D SINGLE USE SWABS REGULAR) PADS    amLODipine (NORVASC) 5 MG tablet TAKE 1 TABLET(5 MG) BY MOUTH DAILY   aspirin EC 81 MG tablet Take 81 mg by mouth daily.    azelastine (ASTELIN) 0.1 % nasal spray Place 2 sprays into both nostrils 2 (two) times daily. Use in each nostril as directed   Blood Glucose Calibration (ACCU-CHEK GUIDE CONTROL) LIQD    Blood Glucose Monitoring Suppl (ACCU-CHEK AVIVA PLUS) w/Device KIT Use to test blood sugars once daily. Dx; E11.9   Blood Pressure Monitoring (B-D ASSURE BPM/AUTO ARM CUFF) MISC Use to check blood pressure as directed.   Budeson-Glycopyrrol-Formoterol (BREZTRI AEROSPHERE) 160-9-4.8 MCG/ACT AERO Inhale 2 puffs into the lungs in the morning and at bedtime.   Carboxymethylcellul-Glycerin (REFRESH OPTIVE OP) Apply to eye daily.    Carboxymethylcellul-Glycerin (REFRESH RELIEVA OP) Apply to eye as directed.   cetirizine (ZYRTEC) 10 MG tablet Take 10 mg by mouth at bedtime.    ciprofloxacin (CIPRO) 500 MG tablet Take 1 tablet (500 mg total) by mouth 2 (two) times daily.   diclofenac sodium (VOLTAREN) 1 % GEL APPLY 2-4 GRAMS TO AFFECTED JOINTS UP TO FOUR TIMES DAILY   famotidine (PEPCID) 40 MG tablet  Take 40 mg by mouth daily.   feeding supplement, ENSURE ENLIVE, (ENSURE ENLIVE) LIQD Take 237 mLs by mouth 3 (three) times daily between meals. (Patient taking differently: Take 237 mLs by mouth 2 (two) times daily after a meal. )   fluticasone (FLONASE) 50 MCG/ACT nasal spray Place 1-2 sprays into both nostrils at bedtime.    glucose blood (ACCU-CHEK  AVIVA PLUS) test strip Use to test blood sugar once daily. Dx: e11.9   Lancets Misc. (ACCU-CHEK FASTCLIX LANCET) KIT USE TO CHECK BLOOD SUGAR 1-2 TIMES DAILY Dx E11.9   linaclotide (LINZESS) 145 MCG CAPS capsule Take 145 mcg by mouth daily as needed (constipation).   losartan (COZAAR) 25 MG tablet TAKE 1 TABLET(25 MG) BY MOUTH DAILY   montelukast (SINGULAIR) 10 MG tablet TAKE 1 TABLET(10 MG) BY MOUTH AT BEDTIME   Multiple Vitamins-Minerals (CENTRUM SILVER 50+WOMEN) TABS Take 1 tablet by mouth daily.   neomycin-bacitracin-polymyxin (NEOSPORIN) OINT Neosporin (neo-bac-polym)   nitroGLYCERIN (NITRO-BID) 2 % ointment APPLY 1/4 INCH TOPICALLY TO AFFECTED FINGERTIPS 3 TIMES A DAY   NON FORMULARY Respivest: Three times a day for 30 minutes    ondansetron (ZOFRAN-ODT) 4 MG disintegrating tablet DISSOLVE 1 TABLET(4 MG) ON THE TONGUE EVERY 8 HOURS AS NEEDED FOR NAUSEA OR VOMITING   Respiratory Therapy Supplies (FLUTTER) DEVI 1 Device by Does not apply route as directed.   sodium chloride HYPERTONIC 3 % nebulizer solution USE 1 VIAL VIA NEBULIZER TWICE A DAY.   triamcinolone cream (KENALOG) 0.1 % Apply 1 application topically daily as needed (for itching of affected areas).    UNABLE TO FIND 3 (three) times daily. Med Name: Refresh Relivia eyedrops   No facility-administered encounter medications on file as of 09/01/2020.    Current Diagnosis: Patient Active Problem List   Diagnosis Date Noted   Pain in right finger(s) 08/31/2020   Ganglion of right wrist 08/31/2020   Unilateral primary osteoarthritis, right hip 04/27/2020   COPD mixed type ([employer]) 12/26/2018   Healthcare maintenance 12/26/2018   History of fall 11/05/2018   Weight loss 11/05/2018   Weakness 11/05/2018   Gangrene of finger ([place]) 10/09/2018   Raynaud's phenomenon with gangrene ([place]) 10/09/2018   LVH (left ventricular hypertrophy) 10/09/2018   Vasospasm ([place]) 09/06/2018   Hypotension 08/08/2018    Leucocytosis 08/08/2018   Elevated troponin I level 08/08/2018   Nausea and vomiting 08/06/2018   Pneumonia 07/12/2018   COPD exacerbation ([place]) 07/11/2018   Primary osteoarthritis of both feet 12/21/2017   DDD (degenerative disc disease), lumbar 12/21/2017   Primary osteoarthritis of both knees 12/21/2017   History of bilateral carpal tunnel release 12/21/2017   DDD (degenerative disc disease), cervical 11/15/2017   Former smoker 11/15/2017   Bronchiectasis without complication ([place]) 39/53/2023   Impingement syndrome of right shoulder 07/25/2017   Hx of fusion of cervical spine 07/25/2017   Cervical spinal stenosis 09/27/2016   Polypharmacy 06/23/2015   Hyperlipidemia 04/19/2015   Visit for preventive health examination 01/01/2015   Primary osteoarthritis involving multiple joints 01/01/2015   Bipolar affective disorder, currently depressed, moderate ([place])    Bipolar I disorder with mania ([place]) 08/17/2014   Hypertension 10/21/2013   Dizziness 03/25/2013   Medication withdrawal ([place]) 03/05/2013   Diabetes mellitus with renal manifestations, controlled ([place]) 11/10/2012   Alopecia areata 02/06/2012   Obesity (BMI 30-39.9) 02/06/2012   Renal insufficiency 07/16/2011   Neuroleptic-induced tardive dyskinesia    Exertional dyspnea 02/07/2011   Dyspnea on exertion 01/19/2011   DM (diabetes mellitus), type  2 ([place]) 04/22/2010   Carpal tunnel syndrome 02/02/2010   CONSTIPATION 02/02/2010   Primary osteoarthritis of both hands 02/02/2010   CATARACTS 04/13/2009   PAIN IN JOINT, ANKLE AND FOOT 09/16/2008   Eosinophilia 07/21/2008   AFFECTIVE DISORDER 04/21/2008   BACK PAIN, CHRONIC 02/12/2008   LEG PAIN 02/12/2008   ABNORMAL INVOLUNTARY MOVEMENTS 12/04/2007   POSTURAL LIGHTHEADEDNESS 11/04/2007   COLONIC POLYPS, HX OF 11/04/2007   Essential hypertension 06/17/2007   HYPERLIPIDEMIA 04/08/2007   MITRAL VALVE PROLAPSE 04/08/2007   GERD  04/08/2007   LOW BACK PAIN SYNDROME 04/08/2007   CHEST PAIN, RECURRENT 04/08/2007    Goals Addressed   None    Reviewed chart for medication changes ahead of medication coordination call.  No OVs, Consults, or hospital visits since last care coordination call/Pharmacist visit. (If appropriate, list visit date, provider name)  No medication changes indicated.  BP Readings from Last 3 Encounters:  08/24/20 134/73  08/19/20 125/76  08/03/20 100/60    Lab Results  Component Value Date   HGBA1C 5.7 (A) 07/09/2020     Patient obtains medications through Adherence Packaging  90 Days   Last adherence delivery included: (medication name and frequency)  Montelukast 10 mg; one tab at Bedtime  Famotidine 40 mg; one tab at Breakfast   Fluticasone (FLONASE) 50 MCG/ACT nasal spray: 2 sprays in nostrils at night  Cetirizine 10 mg tablet at breakfast  Patient is due for next adherence delivery on: 09-06-2020. Called patient and reviewed medications and coordinated delivery.  This delivery to include:  Montelukast 10 mg; one tab at Bedtime  Famotidine 40 mg; one tab at Breakfast   Fluticasone (FLONASE) 50 MCG/ACT nasal spray: 2 sprays in nostrils at night  Patient declined the following medications : Due to supply on hand and PRN  Sodium Chloride nebulizer 3%  Centrum Silver 50+ WomenS; one tab at Breakfast  Ondansetron 4 mg; PRN  Cetirizine 10 mg; one tab at Breakfast  Linzess 145 mcg; one cap   Asprin 81 mg; one tab at Breakfast  Amlodipine 5 mg; one tab at Breakfast  Lamotrigine 25 mg; one tab with Breakfast, one tab with dinner (not taking)  Losartan 25 mg; one tab at Breakfast  Nitroglycerin 2% ointment; PRN  Diclofenac 1% gel; PRN  Trazodone 50 mg; PRN  Duloxetine 30 mg cap (not taking)  Accu-check Smartview test strips   Ondansetron 4 mg; PRN  The patient does not need refills at this time.  Confirmed delivery date of 09-06-2020, advised  patient that pharmacy will contact them the morning of delivery.  Follow-Up:  Coordination of Enhanced Pharmacy Services and Pharmacist Review  Maia Breslow, [place] Assistant [phone]

## 2020-09-01 NOTE — Telephone Encounter (Signed)
  Left message for patient to call back and schedule Medicare Annual Wellness Visit (AWV) either virtually or in office.  Last AWV 07/05/2017  Please schedule at any time with [facility] with the Nurse Health Advisor 1.  45 minute appointment

## 2020-09-02 NOTE — Telephone Encounter (Signed)
The patient is scheduled for 09/21/2020 at 3:30

## 2020-09-03 ENCOUNTER — Telehealth: Payer: Medicare HMO | Admitting: Physician Assistant

## 2020-09-07 ENCOUNTER — Ambulatory Visit: Payer: Medicare HMO | Admitting: Orthopaedic Surgery

## 2020-09-13 ENCOUNTER — Ambulatory Visit: Payer: Medicare HMO | Admitting: Rheumatology

## 2020-09-14 ENCOUNTER — Encounter: Payer: Self-pay | Admitting: Orthopaedic Surgery

## 2020-09-14 ENCOUNTER — Ambulatory Visit: Payer: Medicare HMO | Admitting: Orthopaedic Surgery

## 2020-09-14 ENCOUNTER — Other Ambulatory Visit: Payer: Self-pay

## 2020-09-14 DIAGNOSIS — L03011 Cellulitis of right finger: Secondary | ICD-10-CM

## 2020-09-14 NOTE — Progress Notes (Signed)
Office Visit Note   Patient: Desiree [name]           Date of Birth: 1951/05/10           MRN: [id number] Visit Date: 09/14/2020              Requested by: Madelin Headings, MD [address] [place],  [place] [address] PCP: Madelin Headings, MD   Assessment & Plan: Visit Diagnoses:  1. Infection of nail bed of finger of right hand     Plan: Resolution of epimacular infection with Cipro.  She is released from care can return if she has increased problems.  Follow-Up Instructions: Return if symptoms worsen or fail to improve.   Orders:  No orders of the defined types were placed in this encounter.  No orders of the defined types were placed in this encounter.     Procedures: No procedures performed   Clinical Data: No additional findings.   Subjective: Chief Complaint  Patient presents with  . Right Little Finger - Follow-up  . Right Wrist - Follow-up    HPI 69 year old female returns for follow-up of right small finger eponychial infection treated with Cipro.  She has multiple allergies.  Cellulitis has resolved she noticed some callus that had formed adjacent where she had the infection and tenderness is improved.  She does not have any fever no drainage.  Slight darkening of the nail compared to other fingers.  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  No fever chills.  She still has the right wrist ganglion which is unchanged.  Review of Systems 14 point system update unchanged.   Objective: Vital Signs: Ht 5' 2.5" (1.588 m)   Wt 152 lb (68.9 kg)   BMI 27.36 kg/m   Physical Exam Constitutional:      Appearance: She is well-developed.  HENT:     Head: Normocephalic.     Right Ear: External ear normal.     Left Ear: External ear normal.  Eyes:     Pupils: Pupils are equal, round, and reactive to light.  Neck:     Thyroid: No thyromegaly.     Trachea: No tracheal deviation.    Cardiovascular:     Rate and Rhythm: Normal rate.  Pulmonary:     Effort: Pulmonary effort is normal.  Abdominal:     Palpations: Abdomen is soft.  Skin:    General: Skin is warm and dry.  Neurological:     Mental Status: She is alert and oriented to person, place, and time.  Psychiatric:        Behavior: Behavior normal.     Ortho Exam patient has only trace tenderness no cellulitis no fluctuance no drainage adjacent to the nail plate right fifth finger.  No lymphadenopathy.  Specialty Comments:  No specialty comments available.  Imaging: No results found.   PMFS History: Patient Active Problem List   Diagnosis Date Noted  . Infection of nail bed of finger of right hand 09/15/2020  . Pain in right finger(s) 08/31/2020  . Ganglion of right wrist 08/31/2020  . Unilateral primary osteoarthritis, right hip 04/27/2020  . COPD mixed type (HCC) 12/26/2018  . Healthcare maintenance 12/26/2018  . History of fall 11/05/2018  . Weight loss 11/05/2018  . Weakness 11/05/2018  . Gangrene of finger (HCC) 10/09/2018  . Raynaud's phenomenon with gangrene (HCC) 10/09/2018  . LVH (left ventricular hypertrophy) 10/09/2018  .  Vasospasm (HCC) 09/06/2018  . Hypotension 08/08/2018  . Leucocytosis 08/08/2018  . Elevated troponin I level 08/08/2018  . Nausea and vomiting 08/06/2018  . Pneumonia 07/12/2018  . COPD exacerbation (HCC) 07/11/2018  . Primary osteoarthritis of both feet 12/21/2017  . DDD (degenerative disc disease), lumbar 12/21/2017  . Primary osteoarthritis of both knees 12/21/2017  . History of bilateral carpal tunnel release 12/21/2017  . DDD (degenerative disc disease), cervical 11/15/2017  . Former smoker 11/15/2017  . Bronchiectasis without complication (HCC) 10/25/2017  . Impingement syndrome of right shoulder 07/25/2017  . Hx of fusion of cervical spine 07/25/2017  . Cervical spinal stenosis 09/27/2016  . Polypharmacy 06/23/2015  . Hyperlipidemia 04/19/2015  .  Visit for preventive health examination 01/01/2015  . Primary osteoarthritis involving multiple joints 01/01/2015  . Bipolar affective disorder, currently depressed, moderate (HCC)   . Bipolar I disorder with mania (HCC) 08/17/2014  . Hypertension 10/21/2013  . Dizziness 03/25/2013  . Medication withdrawal (HCC) 03/05/2013  . Diabetes mellitus with renal manifestations, controlled (HCC) 11/10/2012  . Alopecia areata 02/06/2012  . Obesity (BMI 30-39.9) 02/06/2012  . Renal insufficiency 07/16/2011  . Neuroleptic-induced tardive dyskinesia   . Exertional dyspnea 02/07/2011  . Dyspnea on exertion 01/19/2011  . DM (diabetes mellitus), type 2 (HCC) 04/22/2010  . Carpal tunnel syndrome 02/02/2010  . CONSTIPATION 02/02/2010  . Primary osteoarthritis of both hands 02/02/2010  . CATARACTS 04/13/2009  . PAIN IN JOINT, ANKLE AND FOOT 09/16/2008  . Eosinophilia 07/21/2008  . AFFECTIVE DISORDER 04/21/2008  . BACK PAIN, CHRONIC 02/12/2008  . LEG PAIN 02/12/2008  . ABNORMAL INVOLUNTARY MOVEMENTS 12/04/2007  . POSTURAL LIGHTHEADEDNESS 11/04/2007  . COLONIC POLYPS, HX OF 11/04/2007  . Essential hypertension 06/17/2007  . HYPERLIPIDEMIA 04/08/2007  . MITRAL VALVE PROLAPSE 04/08/2007  . GERD 04/08/2007  . LOW BACK PAIN SYNDROME 04/08/2007  . CHEST PAIN, RECURRENT 04/08/2007   Past Medical History:  Diagnosis Date  . Acute gastritis without bleeding 08/08/2018  . Anxiety   . Bipolar disorder (HCC)   . CANDIDIASIS, ESOPHAGEAL 07/28/2009   Qualifier: Diagnosis of  By: Fabian Sharp MD, Neta Mends   . Chronic kidney disease    CKD III  . Complication of anesthesia    was told she stopped breathing for one of her finger surgeries  . COPD (chronic obstructive pulmonary disease) (HCC)   . Depression   . Diabetes mellitus without complication (HCC)    no meds  . FH: colonic polyps   . Fractured elbow    right   . GERD (gastroesophageal reflux disease)   . HH (hiatus hernia)   . History of carpal  tunnel syndrome   . History of chest pain   . History of transfusion of packed red blood cells   . Hyperlipidemia   . Hypertension   . Neuroleptic-induced tardive dyskinesia   . Osteoarthritis of more than one site   . Seasonal allergies     Family History  Problem Relation Age of Onset  . Heart attack Father   . Heart disease Father   . Throat cancer Brother   . Diabetes Mother   . Hypertension Mother   . Heart attack Mother   . Diabetes type II Brother   . Heart disease Brother   . Lung cancer Brother   . Breast cancer Cousin   . Lung cancer Daughter   . Lung cancer Paternal Uncle     Past Surgical History:  Procedure Laterality Date  . AMPUTATION Left 10/14/2018  Procedure: AMPUTATION LEFT LONG FINGER TIP;  Surgeon: Knute Neu, MD;  Location: [facility] OR;  Service: Plastics;  Laterality: Left;  . ANTERIOR CERVICAL DECOMP/DISCECTOMY FUSION  09/27/2016   C5-6 anterior cervical discectomy and fusion, allograft and plate/notes 87/02/6432  . ANTERIOR CERVICAL DECOMP/DISCECTOMY FUSION N/A 09/27/2016   Procedure: C5-6 Anterior Cervical Discectomy and Fusion, Allograft and Plate;  Surgeon: Eldred Manges, MD;  Location: [facility] OR;  Service: Orthopedics;  Laterality: N/A;  . Back Fusion  2002  . BIOPSY  07/19/2018   Procedure: BIOPSY;  Surgeon: Jeani Hawking, MD;  Location: [facility] ENDOSCOPY;  Service: Endoscopy;;  . BIOPSY  02/13/2019   Procedure: BIOPSY;  Surgeon: Jeani Hawking, MD;  Location: [facility] ENDOSCOPY;  Service: Endoscopy;;  . CARPAL TUNNEL RELEASE  Alanni Vader   left  . COLONOSCOPY N/A 01/05/2014   Procedure: COLONOSCOPY;  Surgeon: Charna Elizabeth, MD;  Location: [facility] ENDOSCOPY;  Service: Endoscopy;  Laterality: N/A;  . ELBOW SURGERY     age 71  . ENTEROSCOPY N/A 02/13/2019   Procedure: ENTEROSCOPY;  Surgeon: Jeani Hawking, MD;  Location: [facility] ENDOSCOPY;  Service: Endoscopy;  Laterality: N/A;  . ESOPHAGOGASTRODUODENOSCOPY (EGD) WITH PROPOFOL N/A 07/19/2018   Procedure: ESOPHAGOGASTRODUODENOSCOPY (EGD)  WITH PROPOFOL;  Surgeon: Jeani Hawking, MD;  Location: [facility] ENDOSCOPY;  Service: Endoscopy;  Laterality: N/A;  . EXTERNAL EAR SURGERY Left   . EYE SURGERY     "removed white dots under eyelid"  . FINGER SURGERY Left   . Juvara osteomy    . KNEE SURGERY    . NOSE SURGERY    . Rt. toe bunion    . skin, shave biopsy  05/03/2016   Left occipital scalp, top of scalp  . UPPER EXTREMITY ANGIOGRAPHY Bilateral 10/11/2018   Procedure: UPPER EXTREMITY ANGIOGRAPHY;  Surgeon: Sherren Kerns, MD;  Location: [facility] INVASIVE CV LAB;  Service: Cardiovascular;  Laterality: Bilateral;   Social History   Occupational History  . Not on file  Tobacco Use  . Smoking status: Former Smoker    Packs/day: 2.00    Years: 30.00    Pack years: 60.00    Types: Cigarettes    Quit date: 10/23/2001    Years since quitting: 18.9  . Smokeless tobacco: Never Used  Vaping Use  . Vaping Use: Never used  Substance and Sexual Activity  . Alcohol use: No  . Drug use: No  . Sexual activity: Not on file

## 2020-09-15 DIAGNOSIS — L03011 Cellulitis of right finger: Secondary | ICD-10-CM | POA: Insufficient documentation

## 2020-09-20 ENCOUNTER — Telehealth: Payer: Self-pay | Admitting: Critical Care Medicine

## 2020-09-21 ENCOUNTER — Ambulatory Visit: Payer: Medicare HMO

## 2020-09-21 NOTE — Telephone Encounter (Signed)
Good question- that depends if he is contagious or not. If he has viral pneumonia or something contagious, she should avoid this. If his doctors do not feel that he is likely to transmit it to her, that is fine. I would ask her to try to get information from his doctor regarding recommendations about her safety being around him.  Steffanie Dunn, DO 09/21/20 7:07 PM [facility]

## 2020-09-21 NOTE — Telephone Encounter (Signed)
Called and spoke with patient, advised her that I would send message to provider to get her recommendations and we would call her back.  Dr. Chestine Spore please advise  pt wants to know if its ok for her to be around her brother with her condition he is in the hosp with pnemonia ok to leave a message

## 2020-09-21 NOTE — Telephone Encounter (Signed)
LMTCB for the pt 

## 2020-09-22 NOTE — Telephone Encounter (Signed)
Called spoke with patient.  Let her know Dr. Ophelia Charter recommendations.  She voiced understanding. Nothing further needed at this time.

## 2020-09-30 ENCOUNTER — Telehealth: Payer: Self-pay | Admitting: Neurology

## 2020-09-30 ENCOUNTER — Institutional Professional Consult (permissible substitution): Payer: Medicare HMO | Admitting: Neurology

## 2020-09-30 NOTE — Telephone Encounter (Signed)
Pt was a no show to the new sleep consult that was scheduled today.  Received a message at 1:10 pm that pt had left a VM advising she would not make it.

## 2020-10-01 ENCOUNTER — Telehealth: Payer: Self-pay | Admitting: Pharmacist

## 2020-10-01 NOTE — Chronic Care Management (AMB) (Signed)
Chronic Care Management Pharmacy Assistant   Name: Desiree [name]  MRN: [id number] DOB: [birth year]  Reason for Encounter: Medication Review  PCP : [name], MD  Allergies:   Allergies  Allergen Reactions  . Prednisone Shortness Of Breath, Itching, Nausea And Vomiting and Palpitations  . Solu-Medrol [Methylprednisolone] Anaphylaxis  . Amoxicillin Hives and Rash    Has patient had a PCN reaction causing immediate rash, facial/tongue/throat swelling, SOB or lightheadedness with hypotension:Yes Has patient had a PCN reaction causing severe rash involving mucus membranes or skin necrosis: No Has patient had a PCN reaction that required hospitalization: No Has patient had a PCN reaction occurring within the last 10 years: No If all of the above answers are "NO", then may proceed with Cephalosporin use.   . Codeine Hives, Itching and Rash    Tolerated oxycodone and morphine previously  . Hydrocodone-Acetaminophen Hives, Itching and Rash    Tolerated oxycodone and morphine previously  . Penicillins Hives, Itching and Rash    ALLERGIC REACTION TO ORAL AMOXICILLIN Has patient had a PCN reaction causing immediate rash, facial/tongue/throat swelling, SOB or lightheadedness with hypotension: Yes Has patient had a PCN reaction causing severe rash involving mucus membranes or skin necrosis: No Has patient had a PCN reaction that required hospitalization: No Has patient had a PCN reaction occurring within the last 10 years: No If all of the above answers are "NO", then may proceed with Cephalosporin use.  . Tape Other (See Comments)    sore    Medications: Outpatient Encounter Medications as of 10/01/2020  Medication Sig  . Accu-Chek Softclix Lancets lancets Use to test blood sugar daily. Dx:e11.9  . acetaminophen (TYLENOL) 500 MG tablet Take 2 tablets (1,000 mg total) by mouth every 6 (six) hours as needed. (Patient taking differently: Take 1,000 mg by mouth every 6 (six) hours as  needed for mild pain or headache. )  . Alcohol Swabs (B-D SINGLE USE SWABS REGULAR) PADS   . amLODipine (NORVASC) 5 MG tablet TAKE 1 TABLET(5 MG) BY MOUTH DAILY  . aspirin EC 81 MG tablet Take 81 mg by mouth daily.   Marland Kitchen azelastine (ASTELIN) 0.1 % nasal spray Place 2 sprays into both nostrils 2 (two) times daily. Use in each nostril as directed  . Blood Glucose Calibration (ACCU-CHEK GUIDE CONTROL) LIQD   . Blood Glucose Monitoring Suppl (ACCU-CHEK AVIVA PLUS) w/Device KIT Use to test blood sugars once daily. Dx; E11.9  . Blood Pressure Monitoring (B-D ASSURE BPM/AUTO ARM CUFF) MISC Use to check blood pressure as directed.  . Budeson-Glycopyrrol-Formoterol (BREZTRI AEROSPHERE) 160-9-4.8 MCG/ACT AERO Inhale 2 puffs into the lungs in the morning and at bedtime.  . Carboxymethylcellul-Glycerin (REFRESH OPTIVE OP) Apply to eye daily.   . Carboxymethylcellul-Glycerin (REFRESH RELIEVA OP) Apply to eye as directed.  . cetirizine (ZYRTEC) 10 MG tablet Take 10 mg by mouth at bedtime.   . ciprofloxacin (CIPRO) 500 MG tablet Take 1 tablet (500 mg total) by mouth 2 (two) times daily.  . diclofenac sodium (VOLTAREN) 1 % GEL APPLY 2-4 GRAMS TO AFFECTED JOINTS UP TO FOUR TIMES DAILY  . famotidine (PEPCID) 40 MG tablet Take 40 mg by mouth daily.  . feeding supplement, ENSURE ENLIVE, (ENSURE ENLIVE) LIQD Take 237 mLs by mouth 3 (three) times daily between meals. (Patient taking differently: Take 237 mLs by mouth 2 (two) times daily after a meal. )  . fluticasone (FLONASE) 50 MCG/ACT nasal spray Place 1-2 sprays into both nostrils at bedtime.   Marland Kitchen  glucose blood (ACCU-CHEK AVIVA PLUS) test strip Use to test blood sugar once daily. Dx: e11.9  . Lancets Misc. (ACCU-CHEK FASTCLIX LANCET) KIT USE TO CHECK BLOOD SUGAR 1-2 TIMES DAILY Dx E11.9  . linaclotide (LINZESS) 145 MCG CAPS capsule Take 145 mcg by mouth daily as needed (constipation).  Marland Kitchen losartan (COZAAR) 25 MG tablet TAKE 1 TABLET(25 MG) BY MOUTH DAILY  .  montelukast (SINGULAIR) 10 MG tablet TAKE 1 TABLET(10 MG) BY MOUTH AT BEDTIME  . Multiple Vitamins-Minerals (CENTRUM SILVER 50+WOMEN) TABS Take 1 tablet by mouth daily.  Marland Kitchen neomycin-bacitracin-polymyxin (NEOSPORIN) OINT Neosporin (neo-bac-polym)  . nitroGLYCERIN (NITRO-BID) 2 % ointment APPLY 1/4 INCH TOPICALLY TO AFFECTED FINGERTIPS 3 TIMES A DAY  . NON FORMULARY Respivest: Three times a day for 30 minutes   . ondansetron (ZOFRAN-ODT) 4 MG disintegrating tablet DISSOLVE 1 TABLET(4 MG) ON THE TONGUE EVERY 8 HOURS AS NEEDED FOR NAUSEA OR VOMITING  . Respiratory Therapy Supplies (FLUTTER) DEVI 1 Device by Does not apply route as directed.  . sodium chloride HYPERTONIC 3 % nebulizer solution USE 1 VIAL VIA NEBULIZER TWICE A DAY.  Marland Kitchen triamcinolone cream (KENALOG) 0.1 % Apply 1 application topically daily as needed (for itching of affected areas).   Marland Kitchen UNABLE TO FIND 3 (three) times daily. Med Name: Refresh Relivia eyedrops   No facility-administered encounter medications on file as of 10/01/2020.    Current Diagnosis: Patient Active Problem List   Diagnosis Date Noted  . Infection of nail bed of finger of right hand 09/15/2020  . Pain in right finger(s) 08/31/2020  . Ganglion of right wrist 08/31/2020  . Unilateral primary osteoarthritis, right hip 04/27/2020  . COPD mixed type ([place]) 12/26/2018  . Healthcare maintenance 12/26/2018  . History of fall 11/05/2018  . Weight loss 11/05/2018  . Weakness 11/05/2018  . Gangrene of finger ([place]) 10/09/2018  . Raynaud's phenomenon with gangrene ([place]) 10/09/2018  . LVH (left ventricular hypertrophy) 10/09/2018  . Vasospasm ([place]) 09/06/2018  . Hypotension 08/08/2018  . Leucocytosis 08/08/2018  . Elevated troponin I level 08/08/2018  . Nausea and vomiting 08/06/2018  . Pneumonia 07/12/2018  . COPD exacerbation ([place]) 07/11/2018  . Primary osteoarthritis of both feet 12/21/2017  . DDD (degenerative disc disease), lumbar 12/21/2017  . Primary  osteoarthritis of both knees 12/21/2017  . History of bilateral carpal tunnel release 12/21/2017  . DDD (degenerative disc disease), cervical 11/15/2017  . Former smoker 11/15/2017  . Bronchiectasis without complication ([place]) 42/35/3614  . Impingement syndrome of right shoulder 07/25/2017  . Hx of fusion of cervical spine 07/25/2017  . Cervical spinal stenosis 09/27/2016  . Polypharmacy 06/23/2015  . Hyperlipidemia 04/19/2015  . Visit for preventive health examination 01/01/2015  . Primary osteoarthritis involving multiple joints 01/01/2015  . Bipolar affective disorder, currently depressed, moderate ([place])   . Bipolar I disorder with mania ([place]) 08/17/2014  . Hypertension 10/21/2013  . Dizziness 03/25/2013  . Medication withdrawal ([place]) 03/05/2013  . Diabetes mellitus with renal manifestations, controlled ([place]) 11/10/2012  . Alopecia areata 02/06/2012  . Obesity (BMI 30-39.9) 02/06/2012  . Renal insufficiency 07/16/2011  . Neuroleptic-induced tardive dyskinesia   . Exertional dyspnea 02/07/2011  . Dyspnea on exertion 01/19/2011  . DM (diabetes mellitus), type 2 ([place]) 04/22/2010  . Carpal tunnel syndrome 02/02/2010  . CONSTIPATION 02/02/2010  . Primary osteoarthritis of both hands 02/02/2010  . CATARACTS 04/13/2009  . PAIN IN JOINT, ANKLE AND FOOT 09/16/2008  . Eosinophilia 07/21/2008  . AFFECTIVE DISORDER 04/21/2008  . BACK PAIN, CHRONIC 02/12/2008  .  LEG PAIN 02/12/2008  . ABNORMAL INVOLUNTARY MOVEMENTS 12/04/2007  . POSTURAL LIGHTHEADEDNESS 11/04/2007  . COLONIC POLYPS, HX OF 11/04/2007  . Essential hypertension 06/17/2007  . HYPERLIPIDEMIA 04/08/2007  . MITRAL VALVE PROLAPSE 04/08/2007  . GERD 04/08/2007  . LOW BACK PAIN SYNDROME 04/08/2007  . CHEST PAIN, RECURRENT 04/08/2007    Goals Addressed   None    Reviewed chart for medication changes ahead of medication coordination call. Reviewed  OVs, Consults, or hospital visits since last care coordination  call/Pharmacist visit.  . 09-14-2020 [name], MD Orthopedic Surgery  No medication changes indicated   BP Readings from Last 3 Encounters:  08/24/20 134/73  08/19/20 125/76  08/03/20 100/60    Lab Results  Component Value Date   HGBA1C 5.7 (A) 07/09/2020     Patient obtains medications through Adherence Packaging  90 Days   Last adherence delivery included:  Marland Kitchen Montelukast 10 mg; one tab at Bedtime . Famotidine 40 mg; one tab at Breakfast  . Fluticasone (FLONASE) 50 MCG/ACT nasal spray: 2 sprays in nostrils at night  Patient is due for next adherence delivery on: 10-06-2020. Called patient and reviewed medications and coordinated delivery.  This delivery to include: . Fluticasone (FLONASE) 50 MCG/ACT nasal spray: 2 sprays in nostrils at night . Ondansetron 4 mg; PRN  I spoke with the patient and review medications. There are no changes in medications currently. Patient declined the following medications  due to PRN and supply on hand. The patient is taking the following medications: Marland Kitchen Montelukast 10 mg; one tab at Bedtime . Famotidine 40 mg; one tab at Breakfast  - Sodium Chloride nebulizer 3% - Centrum Silver 50+ Women'S; one tab at Breakfast - Ondansetron 4 mg; PRN - Cetirizine 10 mg; one tab at Breakfast - Linzess 145 mcg; one cap  - Aspirin 81 mg; one tab at Breakfast - Amlodipine 5 mg; one tab at Breakfast - Lamotrigine 25 mg; one tab with Breakfast, one tab with dinner (not taking) - Losartan 25 mg; one tab at Breakfast - Nitroglycerin 2% ointment; PRN - Diclofenac 1% gel; PRN - Trazodone 50 mg; PRN - Duloxetine 30 mg cap (not taking) - Accu-check Smartview test strips  She currently doe snot need any refills  Confirmed delivery date of 10-06-2020, advised patient that pharmacy will contact them the morning of delivery.  Follow-Up:  Coordination of Enhanced Pharmacy Services   Amilia ([place]) Mare Ferrari, Macy Assistant [phone]

## 2020-10-05 DIAGNOSIS — K219 Gastro-esophageal reflux disease without esophagitis: Secondary | ICD-10-CM | POA: Diagnosis not present

## 2020-10-05 DIAGNOSIS — K5904 Chronic idiopathic constipation: Secondary | ICD-10-CM | POA: Diagnosis not present

## 2020-10-13 ENCOUNTER — Other Ambulatory Visit: Payer: Self-pay

## 2020-10-27 ENCOUNTER — Encounter: Payer: Self-pay | Admitting: Surgery

## 2020-10-27 ENCOUNTER — Telehealth: Payer: Self-pay | Admitting: Surgery

## 2020-10-27 ENCOUNTER — Ambulatory Visit: Payer: Medicare HMO | Admitting: Surgery

## 2020-10-27 VITALS — BP 129/77 | HR 80 | Ht 62.5 in | Wt 152.0 lb

## 2020-10-27 DIAGNOSIS — Z5329 Procedure and treatment not carried out because of patient's decision for other reasons: Secondary | ICD-10-CM

## 2020-10-27 NOTE — Telephone Encounter (Signed)
Please advise 

## 2020-10-27 NOTE — Telephone Encounter (Signed)
Pt called stating she had an appt today but due to the wait she left early. It was her last appt before surgery and she wanted to know how necessary it was? She would like a CB if she needs to R/S her appt    [phone]

## 2020-10-28 ENCOUNTER — Encounter (HOSPITAL_COMMUNITY): Payer: Self-pay | Admitting: Vascular Surgery

## 2020-10-28 ENCOUNTER — Encounter (HOSPITAL_COMMUNITY): Payer: Self-pay

## 2020-10-28 ENCOUNTER — Other Ambulatory Visit: Payer: Self-pay

## 2020-10-28 ENCOUNTER — Ambulatory Visit: Payer: Medicare HMO | Admitting: Pharmacist

## 2020-10-28 DIAGNOSIS — I10 Essential (primary) hypertension: Secondary | ICD-10-CM

## 2020-10-28 DIAGNOSIS — Z01818 Encounter for other preprocedural examination: Secondary | ICD-10-CM | POA: Diagnosis not present

## 2020-10-28 DIAGNOSIS — J449 Chronic obstructive pulmonary disease, unspecified: Secondary | ICD-10-CM
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
## 2020-10-28 NOTE — Patient Instructions (Addendum)
DUE TO COVID-19 ONLY ONE VISITOR IS ALLOWED TO COME WITH YOU AND STAY IN THE WAITING ROOM ONLY DURING PRE OP AND PROCEDURE DAY OF SURGERY. THE 1 VISITOR  MAY VISIT WITH YOU AFTER SURGERY IN YOUR PRIVATE ROOM DURING VISITING HOURS ONLY!  YOU NEED TO HAVE A COVID 19 TEST ON: 11/04/20 @ 10:30 AM , THIS TEST MUST BE DONE BEFORE SURGERY,  COVID TESTING SITE [address] [place] [address], IT IS ON THE RIGHT GOING OUT [address] APPROXIMATELY  2 MINUTES PAST [employer] ON THE RIGHT. ONCE YOUR COVID TEST IS COMPLETED,  PLEASE BEGIN THE QUARANTINE INSTRUCTIONS AS OUTLINED IN YOUR HANDOUT.                Desiree [name]   Your procedure is scheduled on: 1/1/722   Report to [facility] Main  Entrance   Report to admitting at : 10:00 AM     Call this number if you have problems the morning of surgery [phone]    Remember:   NO SOLID FOOD AFTER MIDNIGHT THE NIGHT PRIOR TO SURGERY. NOTHING BY MOUTH EXCEPT CLEAR LIQUIDS UNTIL: 9:30 AM . PLEASE FINISH GATORADE DRINK PER SURGEON ORDER  WHICH NEEDS TO BE COMPLETED AT: 9:30 AM .  CLEAR LIQUID DIET   Foods Allowed                                                                     Foods Excluded  Coffee and tea, regular and decaf                             liquids that you cannot  Plain Jell-O any favor except red or purple                                           see through such as: Fruit ices (not with fruit pulp)                                     milk, soups, orange juice  Iced Popsicles                                    All solid food Carbonated beverages, regular and diet                                    Cranberry, grape and apple juices Sports drinks like Gatorade Lightly seasoned clear broth or consume(fat free) Sugar, honey syrup  Sample Menu Breakfast                                Lunch  Supper Cranberry juice                    Beef broth                             Chicken broth Jell-O                                     Grape juice                           Apple juice Coffee or tea                        Jell-O                                      Popsicle                                                Coffee or tea                        Coffee or tea  _____________________________________________________________________  BRUSH YOUR TEETH MORNING OF SURGERY AND RINSE YOUR MOUTH OUT, NO CHEWING GUM CANDY OR MINTS.   Take these medicines the morning of surgery with A SIP OF WATER: amlodipine,famotidine,linaclotide.Use inhalers,eye drops and Flonase as usual.                     You may not have any metal on your body including hair pins and              piercings  Do not wear jewelry, make-up, lotions, powders or perfumes, deodorant             Do not wear nail polish on your fingernails.  Do not shave  48 hours prior to surgery.     Do not bring valuables to the hospital. [facility] IS NOT             RESPONSIBLE   FOR VALUABLES.  Contacts, dentures or bridgework may not be worn into surgery.  Leave suitcase in the car. After surgery it may be brought to your room.     Patients discharged the day of surgery will not be allowed to drive home. IF YOU ARE HAVING SURGERY AND GOING HOME THE SAME DAY, YOU MUST HAVE AN ADULT TO DRIVE YOU HOME AND BE WITH YOU FOR 24 HOURS. YOU MAY GO HOME BY TAXI OR UBER OR ORTHERWISE, BUT AN ADULT MUST ACCOMPANY YOU HOME AND STAY WITH YOU FOR 24 HOURS.  Name and phone number of your driver:  Special Instructions: N/A              Please read over the following fact sheets you were given: _____________________________________________________________________          [facility] - Preparing for Surgery Before surgery, you can play an important role.  Because skin is not sterile, your skin needs to be as free of germs as possible.  You can reduce the number of  germs on your skin by washing with CHG (chlorahexidine  gluconate) soap before surgery.  CHG is an antiseptic cleaner which kills germs and bonds with the skin to continue killing germs even after washing. Please DO NOT use if you have an allergy to CHG or antibacterial soaps.  If your skin becomes reddened/irritated stop using the CHG and inform your nurse when you arrive at Short Stay. Do not shave (including legs and underarms) for at least 48 hours prior to the first CHG shower.  You may shave your face/neck. Please follow these instructions carefully:  1.  Shower with CHG Soap the night before surgery and the  morning of Surgery.  2.  If you choose to wash your hair, wash your hair first as usual with your  normal  shampoo.  3.  After you shampoo, rinse your hair and body thoroughly to remove the  shampoo.                           4.  Use CHG as you would any other liquid soap.  You can apply chg directly  to the skin and wash                       Gently with a scrungie or clean washcloth.  5.  Apply the CHG Soap to your body ONLY FROM THE NECK DOWN.   Do not use on face/ open                           Wound or open sores. Avoid contact with eyes, ears mouth and genitals (private parts).                       Wash face,  Genitals (private parts) with your normal soap.             6.  Wash thoroughly, paying special attention to the area where your surgery  will be performed.  7.  Thoroughly rinse your body with warm water from the neck down.  8.  DO NOT shower/wash with your normal soap after using and rinsing off  the CHG Soap.                9.  Pat yourself dry with a clean towel.            10.  Wear clean pajamas.            11.  Place clean sheets on your bed the night of your first shower and do not  sleep with pets. Day of Surgery : Do not apply any lotions/deodorants the morning of surgery.  Please wear clean clothes to the hospital/surgery center.  FAILURE TO FOLLOW THESE INSTRUCTIONS MAY RESULT IN THE CANCELLATION OF YOUR  SURGERY PATIENT SIGNATURE_________________________________  NURSE SIGNATURE__________________________________  ________________________________________________________________________   Desiree [name]  An incentive spirometer is a tool that can help keep your lungs clear and active. This tool measures how well you are filling your lungs with each breath. Taking long deep breaths may help reverse or decrease the chance of developing breathing (pulmonary) problems (especially infection) following:  A long period of time when you are unable to move or be active. BEFORE THE PROCEDURE   If the spirometer includes an indicator to show your best effort, your nurse or respiratory therapist will set it to a desired goal.  If possible, sit up straight or lean slightly forward. Try not to slouch.  Hold the incentive spirometer in an upright position. INSTRUCTIONS FOR USE  1. Sit on the edge of your bed if possible, or sit up as far as you can in bed or on a chair. 2. Hold the incentive spirometer in an upright position. 3. Breathe out normally. 4. Place the mouthpiece in your mouth and seal your lips tightly around it. 5. Breathe in slowly and as deeply as possible, raising the piston or the ball toward the top of the column. 6. Hold your breath for 3-5 seconds or for as long as possible. Allow the piston or ball to fall to the bottom of the column. 7. Remove the mouthpiece from your mouth and breathe out normally. 8. Rest for a few seconds and repeat Steps 1 through 7 at least 10 times every 1-2 hours when you are awake. Take your time and take a few normal breaths between deep breaths. 9. The spirometer may include an indicator to show your best effort. Use the indicator as a goal to work toward during each repetition. 10. After each set of 10 deep breaths, practice coughing to be sure your lungs are clear. If you have an incision (the cut made at the time of surgery), support your  incision when coughing by placing a pillow or rolled up towels firmly against it. Once you are able to get out of bed, walk around indoors and cough well. You may stop using the incentive spirometer when instructed by your caregiver.  RISKS AND COMPLICATIONS  Take your time so you do not get dizzy or light-headed.  If you are in pain, you may need to take or ask for pain medication before doing incentive spirometry. It is harder to take a deep breath if you are having pain. AFTER USE  Rest and breathe slowly and easily.  It can be helpful to keep track of a log of your progress. Your caregiver can provide you with a simple table to help with this. If you are using the spirometer at home, follow these instructions: SEEK MEDICAL CARE IF:   You are having difficultly using the spirometer.  You have trouble using the spirometer as often as instructed.  Your pain medication is not giving enough relief while using the spirometer.  You develop fever of 100.5 F (38.1 C) or higher. SEEK IMMEDIATE MEDICAL CARE IF:   You cough up bloody sputum that had not been present before.  You develop fever of 102 F (38.9 C) or greater.  You develop worsening pain at or near the incision site. MAKE SURE YOU:   Understand these instructions.  Will watch your condition.  Will get help right away if you are not doing well or get worse. Document Released: 02/19/2007 Document Revised: 01/01/2012 Document Reviewed: 04/22/2007 [facility] Patient Information 2014 [place], [place].   ________________________________________________________________________

## 2020-10-28 NOTE — Progress Notes (Signed)
COVID Vaccine Completed: Yes Date COVID Vaccine completed: 08/04/20 COVID vaccine manufacturer: Pfizer      PCP - Dr. Berniece Andreas Cardiologist - Dr. Charlton Haws. 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 Patient was also instructed that they will need to review over the PAT instructions again at home before surgery.COVID Vaccine Completed: Date COVID Vaccine completed: COVID vaccine manufacturer: Pfizer    Moderna   [employer]   PCP -  Cardiologist -   Chest x-ray -  EKG -  Stress Test -  ECHO -  Cardiac Cath -  Pacemaker/ICD device last checked:  Sleep Study -  CPAP -   Fasting Blood Sugar -  Checks Blood Sugar _____ times a day  Blood Thinner Instructions: Aspirin Instructions: Last Dose:  Anesthesia review:   Patient denies shortness of breath, fever, cough and chest pain at PAT appointment   Patient verbalized understanding of instructions that were given to them at the PAT appointment. Patient was also instructed that they will need to review over the PAT instructions again at home before surgery.

## 2020-10-29 ENCOUNTER — Encounter (HOSPITAL_COMMUNITY)
Admission: RE | Admit: 2020-10-29 | Discharge: 2020-10-29 | Disposition: A | Discharge status: Home / Self Care | Payer: Medicare HMO | Source: Ambulatory Visit | Attending: Orthopaedic Surgery | Admitting: Orthopaedic Surgery

## 2020-10-29 DIAGNOSIS — Z01818 Encounter for other preprocedural examination: Secondary | ICD-10-CM | POA: Diagnosis not present

## 2020-10-29 HISTORY — DX: Dyspnea, unspecified: R06.00

## 2020-10-29 HISTORY — DX: Nonrheumatic mitral (valve) prolapse: I34.1

## 2020-10-29 HISTORY — DX: Cerebral infarction, unspecified: I63.9

## 2020-10-29 HISTORY — DX: Pneumonia, unspecified organism: J18.9

## 2020-10-29 LAB — CBC
HCT: 42.3 % (ref 36.0–46.0)
Hemoglobin: 13.3 g/dL (ref 12.0–15.0)
MCH: 30 pg (ref 26.0–34.0)
MCHC: 31.4 g/dL (ref 30.0–36.0)
MCV: 95.3 fL (ref 80.0–100.0)
Platelets: 276 10*3/uL (ref 150–400)
RBC: 4.44 MIL/uL (ref 3.87–5.11)
RDW: 13.7 % (ref 11.5–15.5)
WBC: 9.7 10*3/uL (ref 4.0–10.5)
nRBC: 0 % (ref 0.0–0.2)

## 2020-10-29 LAB — COMPREHENSIVE METABOLIC PANEL
ALT: 23 U/L (ref 0–44)
AST: 32 U/L (ref 15–41)
Albumin: 4.3 g/dL (ref 3.5–5.0)
Alkaline Phosphatase: 76 U/L (ref 38–126)
Anion gap: 10 (ref 5–15)
BUN: 20 mg/dL (ref 8–23)
CO2: 27 mmol/L (ref 22–32)
Calcium: 9.3 mg/dL (ref 8.9–10.3)
Chloride: 104 mmol/L (ref 98–111)
Creatinine, Ser: 1.78 mg/dL — ABNORMAL HIGH (ref 0.44–1.00)
GFR, Estimated: 31 mL/min — ABNORMAL LOW (ref >60)
Glucose, Bld: 75 mg/dL (ref 70–99)
Potassium: 4 mmol/L (ref 3.5–5.1)
Sodium: 141 mmol/L (ref 135–145)
Total Bilirubin: 0.5 mg/dL (ref 0.3–1.2)
Total Protein: 8 g/dL (ref 6.5–8.1)

## 2020-10-29 LAB — URINALYSIS, ROUTINE W REFLEX MICROSCOPIC
Bilirubin Urine: NEGATIVE
Glucose, UA: NEGATIVE mg/dL
Hgb urine dipstick: NEGATIVE
Ketones, ur: 5 mg/dL — AB
Leukocytes,Ua: NEGATIVE
Nitrite: NEGATIVE
Protein, ur: 100 mg/dL — AB
Specific Gravity, Urine: 1.024 (ref 1.005–1.030)
pH: 5 (ref 5.0–8.0)

## 2020-10-29 NOTE — Progress Notes (Signed)
Lab. Results: Cr. 1.78

## 2020-11-02 ENCOUNTER — Other Ambulatory Visit: Payer: Self-pay

## 2020-11-02 ENCOUNTER — Ambulatory Visit (INDEPENDENT_AMBULATORY_CARE_PROVIDER_SITE_OTHER): Payer: Medicare HMO | Admitting: Physician Assistant

## 2020-11-02 ENCOUNTER — Encounter: Payer: Self-pay | Admitting: Physician Assistant

## 2020-11-02 DIAGNOSIS — F411 Generalized anxiety disorder: Secondary | ICD-10-CM

## 2020-11-02 DIAGNOSIS — F32A Depression, unspecified: Secondary | ICD-10-CM

## 2020-11-02 DIAGNOSIS — G4700 Insomnia, unspecified: Secondary | ICD-10-CM

## 2020-11-02 NOTE — Progress Notes (Signed)
[facility] Med Check  [name],  MRN: [id number]  PCP: [name], MD  Date of Evaluation: 11/02/2020 Time spent:20 minutes  Chief Complaint:  Chief Complaint    Anxiety; Depression       HISTORY/CURRENT STATUS: HPI For routine med check.  Says that she feels fine and is able to enjoy things.  She has not been on any medications for mental health and months now.  She continues to say that she feels better off the medications than she did when taking them.  Energy and motivation are good as much as can be with her physical limitations. Is having surgery soon for a total hip replacement.  She does not isolate, she does not cry easily, appetite and weight are stable.  Has trouble sleeping sometimes but it's b/c her hip pain for the most part. Does have ruminating thoughts some nights. No SI/HI.   Her brother is in the hospital, probably will be discharged to a subacute nursing facility.  She has been going to see him as much as possible.  Patient denies increased energy with decreased need for sleep, no increased talkativeness, no racing thoughts, no impulsivity or risky behaviors, no increased spending, no increased libido, no grandiosity, no increased irritability or anger, and no hallucinations.  Denies dizziness, syncope, seizures, numbness, tingling, tremor, tics, unsteady gait, slurred speech, confusion.  She has the chronic pain in her hip, but otherwise no acute musculoskeletal problems.  Individual Medical History/ Review of Systems: Changes? :No    Past medications for mental health diagnoses include: Lamictal, trazodone caused balance problems, Cymbalta  Allergies: Prednisone, Solu-medrol [methylprednisolone], Amoxicillin, Codeine, Hydrocodone-acetaminophen, Penicillins, and Tape  Current Medications:  Current Outpatient Medications:  .  Accu-Chek Softclix Lancets lancets, Use to test blood sugar daily. Dx:e11.9, Disp: 100 each, Rfl: 12 .   acetaminophen (TYLENOL) 500 MG tablet, Take 2 tablets (1,000 mg total) by mouth every 6 (six) hours as needed. (Patient taking differently: Take 1,000 mg by mouth every 6 (six) hours as needed for mild pain or headache.), Disp: 30 tablet, Rfl: 0 .  Alcohol Swabs (B-D SINGLE USE SWABS REGULAR) PADS, , Disp: , Rfl:  .  amLODipine (NORVASC) 5 MG tablet, TAKE 1 TABLET(5 MG) BY MOUTH DAILY (Patient taking differently: Take 5 mg by mouth daily. TAKE 1 TABLET(5 MG) BY MOUTH DAILY), Disp: 90 tablet, Rfl: 1 .  aspirin EC 81 MG tablet, Take 81 mg by mouth daily. , Disp: , Rfl:  .  azelastine (ASTELIN) 0.1 % nasal spray, Place 2 sprays into both nostrils 2 (two) times daily. Use in each nostril as directed, Disp: 30 mL, Rfl: 12 .  Blood Glucose Calibration (ACCU-CHEK GUIDE CONTROL) LIQD, , Disp: , Rfl:  .  cetirizine (ZYRTEC) 10 MG tablet, Take 10 mg by mouth at bedtime. , Disp: , Rfl:  .  fluticasone (FLONASE) 50 MCG/ACT nasal spray, Place 2 sprays into both nostrils at bedtime., Disp: , Rfl:  .  glucose blood (ACCU-CHEK AVIVA PLUS) test strip, Use to test blood sugar once daily. Dx: e11.9, Disp: 100 each, Rfl: 12 .  Lancets Misc. (ACCU-CHEK FASTCLIX LANCET) KIT, USE TO CHECK BLOOD SUGAR 1-2 TIMES DAILY Dx E11.9, Disp: 1 kit, Rfl: 1 .  linaclotide (LINZESS) 145 MCG CAPS capsule, Take 145 mcg by mouth daily., Disp: , Rfl:  .  losartan (COZAAR) 25 MG tablet, TAKE 1 TABLET(25 MG) BY MOUTH DAILY (Patient taking differently: Take 25 mg by mouth daily. TAKE 1 TABLET(25 MG)  BY MOUTH DAILY), Disp: 90 tablet, Rfl: 1 .  montelukast (SINGULAIR) 10 MG tablet, TAKE 1 TABLET(10 MG) BY MOUTH AT BEDTIME (Patient taking differently: Take 10 mg by mouth at bedtime. TAKE 1 TABLET(10 MG) BY MOUTH AT BEDTIME), Disp: 30 tablet, Rfl: 5 .  Multiple Vitamins-Minerals (CENTRUM SILVER 50+WOMEN) TABS, Take 1 tablet by mouth daily., Disp: , Rfl:  .  nitroGLYCERIN (NITRO-BID) 2 % ointment, APPLY 1/4 INCH TOPICALLY TO AFFECTED FINGERTIPS 3  TIMES A DAY (Patient taking differently: Apply 0.25 inches topically 3 (three) times daily. APPLY 1/4 INCH TOPICALLY TO AFFECTED FINGERTIPS 3 TIMES A DAY), Disp: 30 g, Rfl: 0 .  NON FORMULARY, Respivest: Three times a day for 30 minutes , Disp: , Rfl:  .  ondansetron (ZOFRAN-ODT) 4 MG disintegrating tablet, DISSOLVE 1 TABLET(4 MG) ON THE TONGUE EVERY 8 HOURS AS NEEDED FOR NAUSEA OR VOMITING (Patient taking differently: Take 4 mg by mouth every 8 (eight) hours as needed for nausea or vomiting.), Disp: 20 tablet, Rfl: 0 .  Respiratory Therapy Supplies (FLUTTER) DEVI, 1 Device by Does not apply route as directed., Disp: 1 each, Rfl: 0 .  triamcinolone cream (KENALOG) 0.1 %, Apply 1 application topically daily as needed (for itching of affected areas). , Disp: , Rfl: 0 .  Blood Glucose Monitoring Suppl (ACCU-CHEK AVIVA PLUS) w/Device KIT, Use to test blood sugars once daily. Dx; E11.9, Disp: 1 kit, Rfl: 0 .  Blood Pressure Monitoring (B-D ASSURE BPM/AUTO ARM CUFF) MISC, Use to check blood pressure as directed., Disp: 1 each, Rfl: 0 .  Budeson-Glycopyrrol-Formoterol (BREZTRI AEROSPHERE) 160-9-4.8 MCG/ACT AERO, Inhale 2 puffs into the lungs in the morning and at bedtime., Disp: 10.7 g, Rfl: 0 .  Carboxymethylcellul-Glycerin (REFRESH OPTIVE OP), Place 1 drop into both eyes daily., Disp: , Rfl:  .  Carboxymethylcellulose Sodium (REFRESH LIQUIGEL) 1 % GEL, Place 1 drop into both eyes at bedtime., Disp: , Rfl:  .  ciprofloxacin (CIPRO) 500 MG tablet, Take 1 tablet (500 mg total) by mouth 2 (two) times daily. (Patient not taking: No sig reported), Disp: 20 tablet, Rfl: 0 .  diclofenac sodium (VOLTAREN) 1 % GEL, APPLY 2-4 GRAMS TO AFFECTED JOINTS UP TO FOUR TIMES DAILY (Patient taking differently: Apply 2-4 g topically 4 (four) times daily as needed (pain).), Disp: 400 g, Rfl: 2 .  famotidine (PEPCID) 40 MG tablet, Take 40 mg by mouth daily., Disp: , Rfl:  .  feeding supplement, ENSURE ENLIVE, (ENSURE ENLIVE) LIQD,  Take 237 mLs by mouth 3 (three) times daily between meals., Disp: 14 Bottle, Rfl: 0 Medication Side Effects: none  Family Medical/ Social History: Changes? No  MENTAL HEALTH EXAM:  There were no vitals taken for this visit.There is no height or weight on file to calculate BMI.  General Appearance: Casual, Neat and Well Groomed  Eye Contact:  Good  Speech:  Clear and Coherent and Normal Rate  Volume:  Normal  Mood:  Euthymic  Affect:  Appropriate  Thought Process:  Goal Directed and Descriptions of Associations: Intact  Orientation:  Full (Time, Place, and Person)  Thought Content: Logical   Suicidal Thoughts:  No  Homicidal Thoughts:  No  Memory:  WNL  Judgement:  Good  Insight:  Good  Psychomotor Activity:  walks slowly with a cane  Concentration:  Concentration: Good and Attention Span: Good  Recall:  Good  Fund of Knowledge: Good  Language: Good  Assets:  Desire for Improvement  ADL's:  Intact  Cognition: WNL  Prognosis:  Good    DIAGNOSES:    ICD-10-CM   1. Depression, unspecified depression type  F32.A   2. Generalized anxiety disorder  F41.1   3. Insomnia, unspecified type  G47.00     Receiving Psychotherapy: No    RECOMMENDATIONS:  PDMP was reviewed. I provided 20 minutes of face-to-face time during this encounter in which we discussed the antidepressant, and sleep medicine that she used to take.  Because she is doing well off of the medications, we agree that there is no reason to restart them.  If she does have problems in the future, we will treat then. Return as needed.  Donnal Moat, PA-C

## 2020-11-03 ENCOUNTER — Ambulatory Visit: Payer: Medicare HMO | Admitting: Surgery

## 2020-11-04 ENCOUNTER — Other Ambulatory Visit (HOSPITAL_COMMUNITY): Payer: Medicare HMO

## 2020-11-05 ENCOUNTER — Other Ambulatory Visit: Payer: Self-pay

## 2020-11-05 ENCOUNTER — Ambulatory Visit: Payer: Medicare HMO

## 2020-11-08 ENCOUNTER — Ambulatory Visit (HOSPITAL_COMMUNITY): Admission: RE | Admit: 2020-11-08 | Payer: Medicare HMO | Source: Ambulatory Visit | Admitting: Orthopaedic Surgery

## 2020-11-08 ENCOUNTER — Encounter (HOSPITAL_COMMUNITY): Admission: RE | Payer: Self-pay | Source: Ambulatory Visit

## 2020-11-08 SURGERY — ARTHROPLASTY, HIP, TOTAL, ANTERIOR APPROACH
Anesthesia: Spinal | Site: Hip | Laterality: Right

## 2020-11-11 ENCOUNTER — Telehealth: Payer: Self-pay | Admitting: Specialist

## 2020-11-11 NOTE — Telephone Encounter (Signed)
Pt called stating her hip is giving her pain and she would like Dr.Yates to send in something to help with the pain and she would like to be notified when this is sent  [phone]

## 2020-11-12 ENCOUNTER — Other Ambulatory Visit: Payer: Self-pay | Admitting: Orthopaedic Surgery

## 2020-11-12 MED ORDER — TRAMADOL HCL 50 MG PO TABS
50.0000 mg | ORAL_TABLET | Freq: Two times a day (BID) | ORAL | 0 refills | Status: DC | PRN
Start: 2020-11-12 — End: 2020-11-25

## 2020-11-12 NOTE — Telephone Encounter (Signed)
Please advise 

## 2020-11-12 NOTE — Telephone Encounter (Signed)
I called medication to pharmacy. I called patient to advise and she states that she has allergy to tramadol. She has catch in thigh in walking. Tylenol with no relief. Is there anything else that you can advise?

## 2020-11-12 NOTE — Telephone Encounter (Signed)
I called. OK with percocet and Morphine but I discussed with her best to not take it before surgery so it will work well post op. She is going to try to use the 2 canes she has and unload the left hip.

## 2020-11-12 NOTE — Telephone Encounter (Signed)
OK to send in ultram # 30 one po bid prn pain thanks

## 2020-11-16 ENCOUNTER — Telehealth: Payer: Self-pay | Admitting: *Deleted

## 2020-11-16 NOTE — Telephone Encounter (Signed)
Patient requesting 2021 out of pocket expense report to be mailed to her home address please. Thank you.

## 2020-11-17 DIAGNOSIS — E119 Type 2 diabetes mellitus without complications: Secondary | ICD-10-CM | POA: Diagnosis not present

## 2020-11-17 DIAGNOSIS — H40021 Open angle with borderline findings, high risk, right eye: Secondary | ICD-10-CM | POA: Diagnosis not present

## 2020-11-17 DIAGNOSIS — H47013 Ischemic optic neuropathy, bilateral: Secondary | ICD-10-CM | POA: Diagnosis not present

## 2020-11-17 DIAGNOSIS — H25013 Cortical age-related cataract, bilateral: Secondary | ICD-10-CM | POA: Diagnosis not present

## 2020-11-17 DIAGNOSIS — H532 Diplopia: Secondary | ICD-10-CM | POA: Diagnosis not present

## 2020-11-17 DIAGNOSIS — R260 Ataxic gait: Secondary | ICD-10-CM | POA: Diagnosis not present

## 2020-11-22 ENCOUNTER — Telehealth: Payer: Self-pay | Admitting: Physician Assistant

## 2020-11-22 NOTE — Telephone Encounter (Signed)
Pt LM on VM asking if she can take OTC sleep aid and what is best, if so. Contact # [phone]

## 2020-11-22 NOTE — Telephone Encounter (Signed)
Melatonin first choice. Then either Benadryl alone or Tylenol PM. Unisom is also good. The last 2 can cause increased sedation during the day, possible imbalance leading to falls, dry mouth, constipation.

## 2020-11-23 ENCOUNTER — Inpatient Hospital Stay: Payer: Medicare HMO | Admitting: Orthopaedic Surgery

## 2020-11-24 ENCOUNTER — Telehealth: Payer: Self-pay | Admitting: Pharmacist

## 2020-11-24 NOTE — Chronic Care Management (AMB) (Addendum)
Chronic Care Management Pharmacy Assistant   Name: Desiree [name]  MRN: [id number] DOB: 11/12/1950  Reason for Encounter: Medication Review  PCP : [name], MD  Allergies:   Allergies  Allergen Reactions   Prednisone Shortness Of Breath, Itching, Nausea And Vomiting and Palpitations   Solu-Medrol [Methylprednisolone] Anaphylaxis   Amoxicillin Hives and Rash    Has patient had a PCN reaction causing immediate rash, facial/tongue/throat swelling, SOB or lightheadedness with hypotension:Yes Has patient had a PCN reaction causing severe rash involving mucus membranes or skin necrosis: No Has patient had a PCN reaction that required hospitalization: No Has patient had a PCN reaction occurring within the last 10 years: No If all of the above answers are "NO", then may proceed with Cephalosporin use.    Codeine Hives, Itching and Rash    Tolerated oxycodone and morphine previously   Hydrocodone-Acetaminophen Hives, Itching and Rash    Tolerated oxycodone and morphine previously   Penicillins Hives, Itching and Rash    ALLERGIC REACTION TO ORAL AMOXICILLIN Has patient had a PCN reaction causing immediate rash, facial/tongue/throat swelling, SOB or lightheadedness with hypotension: Yes Has patient had a PCN reaction causing severe rash involving mucus membranes or skin necrosis: No Has patient had a PCN reaction that required hospitalization: No Has patient had a PCN reaction occurring within the last 10 years: No If all of the above answers are "NO", then may proceed with Cephalosporin use.   Tape Other (See Comments)    sore    Medications: Outpatient Encounter Medications as of 11/24/2020  Medication Sig   Accu-Chek Softclix Lancets lancets Use to test blood sugar daily. Dx:e11.9   acetaminophen (TYLENOL) 500 MG tablet Take 2 tablets (1,000 mg total) by mouth every 6 (six) hours as needed. (Patient taking differently: Take 1,000 mg by mouth every 6 (six) hours as needed for  mild pain or headache.)   Alcohol Swabs (B-D SINGLE USE SWABS REGULAR) PADS    amLODipine (NORVASC) 5 MG tablet TAKE 1 TABLET(5 MG) BY MOUTH DAILY (Patient taking differently: Take 5 mg by mouth daily. TAKE 1 TABLET(5 MG) BY MOUTH DAILY)   aspirin EC 81 MG tablet Take 81 mg by mouth daily.    azelastine (ASTELIN) 0.1 % nasal spray Place 2 sprays into both nostrils 2 (two) times daily. Use in each nostril as directed   Blood Glucose Calibration (ACCU-CHEK GUIDE CONTROL) LIQD    Blood Glucose Monitoring Suppl (ACCU-CHEK AVIVA PLUS) w/Device KIT Use to test blood sugars once daily. Dx; E11.9   Blood Pressure Monitoring (B-D ASSURE BPM/AUTO ARM CUFF) MISC Use to check blood pressure as directed.   Budeson-Glycopyrrol-Formoterol (BREZTRI AEROSPHERE) 160-9-4.8 MCG/ACT AERO Inhale 2 puffs into the lungs in the morning and at bedtime.   Carboxymethylcellul-Glycerin (REFRESH OPTIVE OP) Place 1 drop into both eyes daily.   Carboxymethylcellulose Sodium (REFRESH LIQUIGEL) 1 % GEL Place 1 drop into both eyes at bedtime.   cetirizine (ZYRTEC) 10 MG tablet Take 10 mg by mouth at bedtime.    ciprofloxacin (CIPRO) 500 MG tablet Take 1 tablet (500 mg total) by mouth 2 (two) times daily. (Patient not taking: No sig reported)   diclofenac sodium (VOLTAREN) 1 % GEL APPLY 2-4 GRAMS TO AFFECTED JOINTS UP TO FOUR TIMES DAILY (Patient taking differently: Apply 2-4 g topically 4 (four) times daily as needed (pain).)   famotidine (PEPCID) 40 MG tablet Take 40 mg by mouth daily.   feeding supplement, ENSURE ENLIVE, (ENSURE ENLIVE) LIQD Take  237 mLs by mouth 3 (three) times daily between meals.   fluticasone (FLONASE) 50 MCG/ACT nasal spray Place 2 sprays into both nostrils at bedtime.   glucose blood (ACCU-CHEK AVIVA PLUS) test strip Use to test blood sugar once daily. Dx: e11.9   Lancets Misc. (ACCU-CHEK FASTCLIX LANCET) KIT USE TO CHECK BLOOD SUGAR 1-2 TIMES DAILY Dx E11.9   linaclotide (LINZESS) 145 MCG CAPS capsule Take  145 mcg by mouth daily.   losartan (COZAAR) 25 MG tablet TAKE 1 TABLET(25 MG) BY MOUTH DAILY (Patient taking differently: Take 25 mg by mouth daily. TAKE 1 TABLET(25 MG) BY MOUTH DAILY)   montelukast (SINGULAIR) 10 MG tablet TAKE 1 TABLET(10 MG) BY MOUTH AT BEDTIME (Patient taking differently: Take 10 mg by mouth at bedtime. TAKE 1 TABLET(10 MG) BY MOUTH AT BEDTIME)   Multiple Vitamins-Minerals (CENTRUM SILVER 50+WOMEN) TABS Take 1 tablet by mouth daily.   nitroGLYCERIN (NITRO-BID) 2 % ointment APPLY 1/4 INCH TOPICALLY TO AFFECTED FINGERTIPS 3 TIMES A DAY (Patient taking differently: Apply 0.25 inches topically 3 (three) times daily. APPLY 1/4 INCH TOPICALLY TO AFFECTED FINGERTIPS 3 TIMES A DAY)   NON FORMULARY Respivest: Three times a day for 30 minutes    ondansetron (ZOFRAN-ODT) 4 MG disintegrating tablet DISSOLVE 1 TABLET(4 MG) ON THE TONGUE EVERY 8 HOURS AS NEEDED FOR NAUSEA OR VOMITING (Patient taking differently: Take 4 mg by mouth every 8 (eight) hours as needed for nausea or vomiting.)   Respiratory Therapy Supplies (FLUTTER) DEVI 1 Device by Does not apply route as directed.   traMADol (ULTRAM) 50 MG tablet Take 1 tablet (50 mg total) by mouth 2 (two) times daily as needed.   triamcinolone cream (KENALOG) 0.1 % Apply 1 application topically daily as needed (for itching of affected areas).    No facility-administered encounter medications on file as of 11/24/2020.    Current Diagnosis: Patient Active Problem List   Diagnosis Date Noted   Infection of nail bed of finger of right hand 09/15/2020   Pain in right finger(s) 08/31/2020   Ganglion of right wrist 08/31/2020   Unilateral primary osteoarthritis, right hip 04/27/2020   COPD mixed type ([place]) 12/26/2018   Healthcare maintenance 12/26/2018   History of fall 11/05/2018   Weight loss 11/05/2018   Weakness 11/05/2018   Gangrene of finger ([place]) 10/09/2018   Raynaud's phenomenon with gangrene ([place]) 10/09/2018   LVH (left ventricular  hypertrophy) 10/09/2018   Vasospasm ([place]) 09/06/2018   Hypotension 08/08/2018   Leucocytosis 08/08/2018   Elevated troponin I level 08/08/2018   Nausea and vomiting 08/06/2018   Pneumonia 07/12/2018   COPD exacerbation ([place]) 07/11/2018   Primary osteoarthritis of both feet 12/21/2017   DDD (degenerative disc disease), lumbar 12/21/2017   Primary osteoarthritis of both knees 12/21/2017   History of bilateral carpal tunnel release 12/21/2017   DDD (degenerative disc disease), cervical 11/15/2017   Former smoker 11/15/2017   Bronchiectasis without complication ([place]) 00/86/7619   Impingement syndrome of right shoulder 07/25/2017   Hx of fusion of cervical spine 07/25/2017   Cervical spinal stenosis 09/27/2016   Polypharmacy 06/23/2015   Hyperlipidemia 04/19/2015   Visit for preventive health examination 01/01/2015   Primary osteoarthritis involving multiple joints 01/01/2015   Bipolar affective disorder, currently depressed, moderate ([place])    Bipolar I disorder with mania ([place]) 08/17/2014   Hypertension 10/21/2013   Dizziness 03/25/2013   Medication withdrawal ([place]) 03/05/2013   Diabetes mellitus with renal manifestations, controlled ([place]) 11/10/2012   Alopecia areata 02/06/2012  Obesity (BMI 30-39.9) 02/06/2012   Renal insufficiency 07/16/2011   Neuroleptic-induced tardive dyskinesia    Exertional dyspnea 02/07/2011   Dyspnea on exertion 01/19/2011   DM (diabetes mellitus), type 2 ([place]) 04/22/2010   Carpal tunnel syndrome 02/02/2010   CONSTIPATION 02/02/2010   Primary osteoarthritis of both hands 02/02/2010   CATARACTS 04/13/2009   PAIN IN JOINT, ANKLE AND FOOT 09/16/2008   Eosinophilia 07/21/2008   AFFECTIVE DISORDER 04/21/2008   BACK PAIN, CHRONIC 02/12/2008   LEG PAIN 02/12/2008   ABNORMAL INVOLUNTARY MOVEMENTS 12/04/2007   POSTURAL LIGHTHEADEDNESS 11/04/2007   COLONIC POLYPS, HX OF 11/04/2007   Essential hypertension 06/17/2007   HYPERLIPIDEMIA 04/08/2007   MITRAL  VALVE PROLAPSE 04/08/2007   GERD 04/08/2007   LOW BACK PAIN SYNDROME 04/08/2007   CHEST PAIN, RECURRENT 04/08/2007    Goals Addressed   None    Reviewed chart for medication changes ahead of medication coordination call. ReviewedOVs, Consults, or hospital visits since last care coordination call/Pharmacist visit.   11-02-2020 Addison Lank, PA-C Psychiatry No medication changes indicated.  BP Readings from Last 3 Encounters:  10/29/20 (!) 107/58  10/27/20 129/77  08/24/20 134/73    Lab Results  Component Value Date   HGBA1C 5.7 (A) 07/09/2020     Patient obtains medications through Adherence Packaging  90 Days  Last adherence delivery included:  Fluticasone (FLONASE) 50 MCG/ACT nasal spray: 2 sprays in nostrils at night Ondansetron 4 mg; PRN  Patient declined the following medication last month due to PRN use/additional supply on hand. Montelukast 10 mg; one tablet at bedtime Famotidine 40 mg; one tablet at breakfast  Sodium Chloride nebulizer 3% Centrum Silver 50+ Women's; one tablet at breakfast Ondansetron 4 mg; PRN Cetirizine 10 mg; one tablet at breakfast Linzess 145 mcg; one cap  Aspirin 81 mg; one tablet at breakfast Amlodipine 5 mg; one tablet at breakfast Lamotrigine 25 mg; one tablet with breakfast, one tablet with dinner (not taking) Losartan 25 mg; one tablet at breakfast Nitroglycerin 2% ointment; PRN Diclofenac 1% gel; PRN Trazodone 50 mg; PRN Duloxetine 30 mg cap (not taking) Accu-check SmartView test strips   Patient is due for next adherence delivery on: 12/06/2020. Called patient and reviewed medications and coordinated delivery. This delivery to include: Ondansetron 4 mg; PRN Montelukast 10 mg; one tablet at bedtime Famotidine 40 mg; one tablet at breakfast Amlodipine 5 mg; one tablet at breakfast Losartan 25 mg; one tablet at breakfast  I spoke with the patient and review medications. There are no changes in medications currently. Patient  declined these medication(s) this month due to PRN use/additional supply on hand.The patient is taking the following medications: Sodium Chloride nebulizer 3% Centrum Silver 50+ Women's; one tablet at breakfast Cetirizine 10 mg; one tablet at Breakfast Linzess 145 mcg; one capsule Aspirin 81 mg; one tablet at Breakfast Lamotrigine 25 mg; one tablet with Breakfast, one tablet with dinner (not taking) Nitroglycerin 2% ointment; PRN Diclofenac 1% gel; PRN Trazodone 50 mg; PRN Duloxetine 30 mg cap (not taking) Accu-check SmartView test strips  Fluticasone (FLONASE) 50 MCG/ACT nasal spray: 2 sprays in nostrils at night  Patient needs refills for:  Montelukast 10 mg  Famotidine 40 mg   Amlodipine 5 mg  Confirmed delivery date of 12/06/2020, advised patient that pharmacy will contact them the morning of delivery. Follow-Up:  Care Coordination with Outside Provider and Pharmacist Review  Maia Breslow, [place] Assistant [phone]

## 2020-11-24 NOTE — Telephone Encounter (Signed)
Rtc to patient and discussed options, she reports trying melatonin previously and she ended up in the hospital. Reports they told her she had too much. Advised her not to try that one then. She reports she will try Tylenol PM and she how that works. If not improvements will call back

## 2020-11-25 ENCOUNTER — Encounter: Payer: Self-pay | Admitting: Neurology

## 2020-11-25 ENCOUNTER — Ambulatory Visit: Payer: Medicare HMO | Admitting: Neurology

## 2020-11-25 VITALS — BP 123/74 | HR 74 | Ht 62.5 in | Wt 155.0 lb

## 2020-11-25 DIAGNOSIS — Z72821 Inadequate sleep hygiene: Secondary | ICD-10-CM

## 2020-11-25 DIAGNOSIS — G2401 Drug induced subacute dyskinesia: Secondary | ICD-10-CM | POA: Diagnosis not present

## 2020-11-25 DIAGNOSIS — R0683 Snoring: Secondary | ICD-10-CM

## 2020-11-25 DIAGNOSIS — G8929 Other chronic pain: Secondary | ICD-10-CM | POA: Diagnosis not present

## 2020-11-25 DIAGNOSIS — F99 Mental disorder, not otherwise specified: Secondary | ICD-10-CM

## 2020-11-25 DIAGNOSIS — G4701 Insomnia due to medical condition: Secondary | ICD-10-CM

## 2020-11-25 DIAGNOSIS — F5105 Insomnia due to other mental disorder: Secondary | ICD-10-CM | POA: Diagnosis not present
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
## 2020-11-25 NOTE — Patient Instructions (Signed)

## 2020-11-29 ENCOUNTER — Other Ambulatory Visit: Payer: Self-pay

## 2020-11-29 MED ORDER — FAMOTIDINE 40 MG PO TABS: 40.0000 mg | ORAL_TABLET | Freq: Every day | ORAL | 0 refills | Status: DC

## 2020-11-29 MED ORDER — MONTELUKAST SODIUM 10 MG PO TABS: ORAL_TABLET | ORAL | 5 refills | Status: DC

## 2020-11-29 MED ORDER — AMLODIPINE BESYLATE 5 MG PO TABS
ORAL_TABLET | ORAL | 0 refills | Status: DC
Start: 2020-11-29 — End: 2020-12-03

## 2020-11-29 NOTE — Patient Outreach (Signed)
[facility]) Care Management  11/29/2020  Desiree [name] Jun 14, 1951 [id number]   Telephone call to patient for disease management follow up.   No answer.  HIPAA compliant voice message left.    Plan: If no return call, RN CM will attempt patient again in the month of May and send letter.    Bary Leriche, RN, MSN [facility] Care Management Care Management Coordinator Direct Line [phone] Cell [phone] Toll Free: [phone]  Fax: [phone]

## 2020-11-29 NOTE — Patient Outreach (Signed)
[facility]) Care Management  [facility] Care Manager  11/29/2020   Desiree [name] Oct 15, 1951 [id number]  Subjective: Return call to patient. She reports she is doing good. She has moved since last conversation.  Patient still getting settled in her new apartment.  Patient waiting to have hip surgery. She has a friend she says that is going to help her. She reports her COPD has been good with Breztri.  Discussed COPD zones and management.  Objective:   Encounter Medications:  Outpatient Encounter Medications as of 11/29/2020  Medication Sig  . Accu-Chek Softclix Lancets lancets Use to test blood sugar daily. Dx:e11.9  . acetaminophen (TYLENOL) 500 MG tablet Take 2 tablets (1,000 mg total) by mouth every 6 (six) hours as needed. (Patient taking differently: Take 1,000 mg by mouth every 6 (six) hours as needed for mild pain or headache.)  . Alcohol Swabs (B-D SINGLE USE SWABS REGULAR) PADS   . amLODipine (NORVASC) 5 MG tablet TAKE 1 TABLET(5 MG) BY MOUTH DAILY  . aspirin EC 81 MG tablet Take 81 mg by mouth daily.   Marland Kitchen azelastine (ASTELIN) 0.1 % nasal spray Place 2 sprays into both nostrils 2 (two) times daily. Use in each nostril as directed  . Blood Glucose Calibration (ACCU-CHEK GUIDE CONTROL) LIQD   . Blood Pressure Monitoring (B-D ASSURE BPM/AUTO ARM CUFF) MISC Use to check blood pressure as directed.  . Budeson-Glycopyrrol-Formoterol (BREZTRI AEROSPHERE) 160-9-4.8 MCG/ACT AERO Inhale 2 puffs into the lungs in the morning and at bedtime.  . Carboxymethylcellul-Glycerin (REFRESH OPTIVE OP) Place 1 drop into both eyes daily.  . Carboxymethylcellulose Sodium (REFRESH LIQUIGEL) 1 % GEL Place 1 drop into both eyes at bedtime.  . cetirizine (ZYRTEC) 10 MG tablet Take 10 mg by mouth at bedtime.   . diclofenac sodium (VOLTAREN) 1 % GEL APPLY 2-4 GRAMS TO AFFECTED JOINTS UP TO FOUR TIMES DAILY (Patient taking differently: Apply 2-4 g topically 4 (four) times daily as needed (pain).)  .  famotidine (PEPCID) 40 MG tablet Take 1 tablet (40 mg total) by mouth daily.  . feeding supplement, ENSURE ENLIVE, (ENSURE ENLIVE) LIQD Take 237 mLs by mouth 3 (three) times daily between meals.  . fluticasone (FLONASE) 50 MCG/ACT nasal spray Place 2 sprays into both nostrils at bedtime.  Marland Kitchen glucose blood (ACCU-CHEK AVIVA PLUS) test strip Use to test blood sugar once daily. Dx: e11.9  . linaclotide (LINZESS) 145 MCG CAPS capsule Take 145 mcg by mouth daily.  Marland Kitchen losartan (COZAAR) 25 MG tablet TAKE 1 TABLET(25 MG) BY MOUTH DAILY (Patient taking differently: Take 25 mg by mouth daily. TAKE 1 TABLET(25 MG) BY MOUTH DAILY)  . montelukast (SINGULAIR) 10 MG tablet TAKE 1 TABLET(10 MG) BY MOUTH AT BEDTIME  . Multiple Vitamins-Minerals (CENTRUM SILVER 50+WOMEN) TABS Take 1 tablet by mouth daily.  . nitroGLYCERIN (NITRO-BID) 2 % ointment APPLY 1/4 INCH TOPICALLY TO AFFECTED FINGERTIPS 3 TIMES A DAY (Patient taking differently: Apply 0.25 inches topically 3 (three) times daily. APPLY 1/4 INCH TOPICALLY TO AFFECTED FINGERTIPS 3 TIMES A DAY)  . NON FORMULARY Respivest: Three times a day for 30 minutes   . ondansetron (ZOFRAN-ODT) 4 MG disintegrating tablet DISSOLVE 1 TABLET(4 MG) ON THE TONGUE EVERY 8 HOURS AS NEEDED FOR NAUSEA OR VOMITING (Patient taking differently: Take 4 mg by mouth every 8 (eight) hours as needed for nausea or vomiting.)  . Respiratory Therapy Supplies (FLUTTER) DEVI 1 Device by Does not apply route as directed.  . triamcinolone cream (KENALOG) 0.1 %  Apply 1 application topically daily as needed (for itching of affected areas).    No facility-administered encounter medications on file as of 11/29/2020.    Functional Status:  In your present state of health, do you have any difficulty performing the following activities: 11/29/2020 10/28/2020  Hearing? N N  Vision? N N  Difficulty concentrating or making decisions? N N  Walking or climbing stairs? N Y  Dressing or bathing? N N  Doing errands,  shopping? N N  Preparing Food and eating ? N -  Using the Toilet? N -  In the past six months, have you accidently leaked urine? N -  Do you have problems with loss of bowel control? N -  Managing your Medications? N -  Managing your Finances? N -  Housekeeping or managing your Housekeeping? N -  Some recent data might be hidden    Fall/Depression Screening: Fall Risk  11/29/2020 11/25/2020 06/02/2020  Falls in the past year? 1 1 0  Number falls in past yr: 1 1 -  Injury with Fall? 0 0 -  Risk for fall due to : - - -  Follow up Falls prevention discussed - -   PHQ 2/9 Scores 06/02/2020 03/02/2020 06/13/2019 03/12/2019 02/10/2019 08/22/2018 07/22/2018  PHQ - 2 Score 0 0 0 0 0 0 0  Exception Documentation - - - - - - -  Not completed - - - - - - -    Assessment: Patient managing chronic conditions.  Goals Addressed            This Visit's Progress   . RN CM-Make and Keep All Appointments       Timeframe:  Long-Range Goal Priority:  High Start Date:    11/29/20                         Expected End Date:     06/22/21                  Follow Up Date 03/22/21   - ask family or friend for a ride    Why is this important?    Part of staying healthy is seeing the doctor for follow-up care.   If you forget your appointments, there are some things you can do to stay on track.    Notes: 11/29/20 Patient currently drives self to appointments.      . RN CM-Track and Manage My Symptoms-COPD       Timeframe:  Long-Range Goal Priority:  High Start Date: 11/29/20                            Expected End Date:    06/22/21                   Follow Up Date 03/22/21   - follow rescue plan if symptoms flare-up - keep follow-up appointments    Why is this important?    Tracking your symptoms and other information about your health helps your doctor plan your care.   Write down the symptoms, the time of day, what you were doing and what medicine you are taking.   You will soon learn how to  manage your symptoms.     Notes: 11/29/20 Patient reports COPD management is good with Breztri usage.         Plan:  RN CM will contact in May Follow-up:  Patient agrees to Care Plan and Follow-up.   Bary Leriche, RN, MSN [facility] Care Management Care Management Coordinator Direct Line [phone] Cell [phone] Toll Free: [phone]  Fax: [phone]

## 2020-11-30 ENCOUNTER — Ambulatory Visit: Payer: Self-pay

## 2020-12-02 ENCOUNTER — Other Ambulatory Visit: Payer: Self-pay

## 2020-12-03 ENCOUNTER — Other Ambulatory Visit: Payer: Self-pay | Admitting: Internal Medicine

## 2020-12-08 DIAGNOSIS — D7219 Other eosinophilia: Secondary | ICD-10-CM | POA: Diagnosis not present

## 2020-12-08 DIAGNOSIS — R7989 Other specified abnormal findings of blood chemistry: Secondary | ICD-10-CM | POA: Diagnosis not present

## 2020-12-08 DIAGNOSIS — D6859 Other primary thrombophilia: Secondary | ICD-10-CM | POA: Diagnosis not present

## 2020-12-09 ENCOUNTER — Encounter: Payer: Self-pay | Admitting: Surgery

## 2020-12-09 ENCOUNTER — Telehealth: Payer: Self-pay | Admitting: *Deleted

## 2020-12-09 ENCOUNTER — Telehealth: Payer: Self-pay | Admitting: Internal Medicine

## 2020-12-09 ENCOUNTER — Ambulatory Visit (INDEPENDENT_AMBULATORY_CARE_PROVIDER_SITE_OTHER): Payer: Medicare HMO | Admitting: Surgery

## 2020-12-09 VITALS — BP 144/83 | HR 93 | Ht 62.5 in | Wt 155.0 lb

## 2020-12-09 DIAGNOSIS — M1611 Unilateral primary osteoarthritis, right hip: Secondary | ICD-10-CM

## 2020-12-09 NOTE — Telephone Encounter (Signed)
   Primary Cardiologist: Charlton Haws, MD  Chart reviewed as part of pre-operative protocol coverage. Given past medical history and time since last visit, based on ACC/AHA guidelines, Desiree [name] would be at acceptable risk for the planned procedure without further cardiovascular testing.   Pt was seen by cardiology 04/2020 and 06/2020 at which time she was cleared for surgery. Since that time she has had no concerning anginal symptoms. She has no prior hx of CAD and is on ASA for risk reduction therefore she may hold ASA prior to procedure and resume when safe from a surgical standpoint.   The patient was advised that if she develops new symptoms prior to surgery to contact our office to arrange for a follow-up visit, and she verbalized understanding.  I will route this recommendation to the requesting party via Epic fax function and remove from pre-op pool.  Please call with questions.  Georgie Chard, NP 12/09/2020, 12:06 PM

## 2020-12-09 NOTE — Telephone Encounter (Signed)
   [facility] Pre-operative Risk Assessment    HEARTCARE STAFF: - Please ensure there is not already an duplicate clearance open for this procedure. - Under Visit Info/Reason for Call, type in Other and utilize the format Clearance MM/DD/YY or Clearance TBD. Do not use dashes or single digits. - If request is for dental extraction, please clarify the # of teeth to be extracted.  Request for surgical clearance:  1. What type of surgery is being performed? RIGHT TOTAL HIP ARTHROPLASTY   2. When is this surgery scheduled? TBD   3. What type of clearance is required (medical clearance vs. Pharmacy clearance to hold med vs. Both)? MEDICAL  4. Are there any medications that need to be held prior to surgery and how long? ASA    5. Practice name and name of physician performing surgery? [facility]; DR. Vonore   6. What is the office phone number? [phone]   7.   What is the office fax number? [place].   Anesthesia type (None, local, MAC, general) ? SPINAL   Julaine Hua 12/09/2020, 11:13 AM  _________________________________________________________________   (provider comments below)

## 2020-12-09 NOTE — Telephone Encounter (Signed)
My last visit with her was September 2021  Review of record shows visit with her specialists  neurology for sleep, rheumatology , hematology  Stable  And  Has a pulmonary check tomorrow. Uncertain  If her cardiology has updated assessment .  From the medical only  point of view I dont see any  New concerns about proceeding with surgery .   Please ask her cardiologist if they agree.

## 2020-12-09 NOTE — Telephone Encounter (Signed)
Pt is calling in to see if she need to have another surgical clearance since they had to put her surgery off last month.  Pt stated that her R hip surgery is scheduled for 12/17/2020 (Friday) and would like to know if the January clearance will be okay to use.   Dr. Ophelia Charter would like to have the answer Monday or Tuesday of next week so that the pt does not have to reschedule her surgery again.  Pt is needing a call back to let her know.

## 2020-12-09 NOTE — Progress Notes (Signed)
70 year old black female history of right hip DJD and chronic pain comes in for preop evaluation.  Symptoms unchanged from previous visit.  She is wanting to proceed with right total hip replacement that is scheduled.  Today history and physical performed.  Patient had medical and pulmonary clearances given July 2021.  Cardiology clearance also given last year.  We do not have any updated clearances from her physicians.  Patient admits to having orthopnea, exertional dyspnea.  No complaints of chest pain.  I asked our surgery scheduler Debbie to get updated clearances from Dr. Fabian Sharp, Dr. Eden Emms, Dr. Ophelia Charter office.  All questions answered.

## 2020-12-10 ENCOUNTER — Encounter: Payer: Self-pay | Admitting: Adult Health

## 2020-12-10 ENCOUNTER — Ambulatory Visit (INDEPENDENT_AMBULATORY_CARE_PROVIDER_SITE_OTHER): Payer: Medicare HMO

## 2020-12-10 ENCOUNTER — Other Ambulatory Visit: Payer: Self-pay

## 2020-12-10 ENCOUNTER — Ambulatory Visit: Payer: Medicare HMO | Admitting: Adult Health

## 2020-12-10 VITALS — BP 116/74 | HR 88 | Temp 97.3°F | Ht 62.0 in | Wt 160.0 lb

## 2020-12-10 DIAGNOSIS — Z01818 Encounter for other preprocedural examination: Secondary | ICD-10-CM | POA: Diagnosis not present

## 2020-12-10 DIAGNOSIS — K449 Diaphragmatic hernia without obstruction or gangrene: Secondary | ICD-10-CM | POA: Diagnosis not present

## 2020-12-10 DIAGNOSIS — J479 Bronchiectasis, uncomplicated: Secondary | ICD-10-CM

## 2020-12-10 DIAGNOSIS — J449 Chronic obstructive pulmonary disease, unspecified: Secondary | ICD-10-CM

## 2020-12-10 DIAGNOSIS — K219 Gastro-esophageal reflux disease without esophagitis: Secondary | ICD-10-CM | POA: Diagnosis not present

## 2020-12-10 DIAGNOSIS — I10 Essential (primary) hypertension: Secondary | ICD-10-CM | POA: Diagnosis not present

## 2020-12-10 NOTE — Patient Instructions (Addendum)
Chest x-ray today Continue on BREZTRI 2 puffs Twice daily  , rinse after use.  Activity as tolerated.  Albuterol As needed   Restart Flutter valve Twice daily   Restart Vest therapy daily .  Follow up with [facility] in 4 months and As needed

## 2020-12-10 NOTE — Assessment & Plan Note (Signed)
Mild bronchiectasis on previous CT chest.  Patient has had minimum symptoms no recent antibiotic or steroid use.  Clinically she appears to be stable. Chest x-ray today. I have encouraged her on mucociliary clearance.  She is planning for upcoming surgery.  Would definitely restart flutter valve and vest therapy post surgery.   Plan  Patient Instructions  Chest x-ray today Continue on BREZTRI 2 puffs Twice daily  , rinse after use.  Activity as tolerated.  Albuterol As needed   Restart Flutter valve Twice daily   Restart Vest therapy daily .  Follow up with [facility] in 4 months and As needed

## 2020-12-10 NOTE — Progress Notes (Signed)
Surgical Instructions    Your procedure is scheduled on 12/17/20.  Report to [facility] Main Entrance "A" at 05:30 A.M., then check in with the Admitting office.  Call this number if you have problems the morning of surgery:  [phone]   If you have any questions prior to your surgery date call [phone]: Open Monday-Friday 8am-4pm    Remember:  Do not eat after midnight the night before your surgery  You may drink clear liquids until 04:30am the morning of your surgery.   Clear liquids allowed are: Water, Non-Citrus Juices (without pulp), Carbonated Beverages, Clear Tea, Black Coffee Only, and Gatorade  Patient Instructions   . The day of surgery (if you have diabetes): o Drink ONE (1) 4oz water by 4:30am. Drink in one sitting. Do not sip.  o This drink was given to you during your hospital  pre-op appointment visit.  o Nothing else to drink after completing the  Water.          If you have questions, please contact your surgeon's office.     Take these medicines the morning of surgery with A SIP OF WATER  acetaminophen (TYLENOL) if needed amLODipine (NORVASC) Budeson-Glycopyrrol-Formoterol (BREZTRI AEROSPHERE) inhaler Carboxymethylcellulose Sodium (THERATEARS) as needed for dry eyes cetirizine (ZYRTEC)  famotidine (PEPCID) linaclotide (LINZESS)  RESTASIS 0.05 % eye drops      HOW TO MANAGE YOUR DIABETES BEFORE AND AFTER SURGERY  Why is it important to control my blood sugar before and after surgery? . Improving blood sugar levels before and after surgery helps healing and can limit problems. . A way of improving blood sugar control is eating a healthy diet by: o  Eating less sugar and carbohydrates o  Increasing activity/exercise o  Talking with your doctor about reaching your blood sugar goals . High blood sugars (greater than 180 mg/dL) can raise your risk of infections and slow your recovery, so you will need to focus on controlling your diabetes during  the weeks before surgery. . Make sure that the doctor who takes care of your diabetes knows about your planned surgery including the date and location.  How do I manage my blood sugar before surgery? . Check your blood sugar at least 4 times a day, starting 2 days before surgery, to make sure that the level is not too high or low. . Check your blood sugar the morning of your surgery when you wake up and every 2 hours until you get to the Short Stay unit. o If your blood sugar is less than 70 mg/dL, you will need to treat for low blood sugar: - Do not take insulin. - Treat a low blood sugar (less than 70 mg/dL) with  cup of clear juice (cranberry or apple), 4 glucose tablets, OR glucose gel. - Recheck blood sugar in 15 minutes after treatment (to make sure it is greater than 70 mg/dL). If your blood sugar is not greater than 70 mg/dL on recheck, call [phone] for further instructions. . Report your blood sugar to the short stay nurse when you get to Short Stay.  . If you are admitted to the hospital after surgery: o Your blood sugar will be checked by the staff and you will probably be given insulin after surgery (instead of oral diabetes medicines) to make sure you have good blood sugar levels. o The goal for blood sugar control after surgery is 80-180 mg/dL.    As of today, STOP taking any Aspirin (unless otherwise instructed by your  surgeon) Aleve, Naproxen, Ibuprofen, Motrin, Advil, Goody's, BC's, all herbal medications, fish oil, and all vitamins.                     Do not wear jewelry, make up, or nail polish            Do not wear lotions, powders, perfumes/colognes, or deodorant.            Do not shave 48 hours prior to surgery.              Do not bring valuables to the hospital.            [facility] is not responsible for any belongings or valuables.  Do NOT Smoke (Tobacco/Vaping) or drink Alcohol 24 hours prior to your procedure If you use a CPAP at night, you may bring  all equipment for your overnight stay.   Contacts, glasses, dentures or bridgework may not be worn into surgery, please bring cases for these belongings   For patients admitted to the hospital, discharge time will be determined by your treatment team.   Patients discharged the day of surgery will not be allowed to drive home, and someone needs to stay with them for 24 hours.    Special instructions:   [place]- Preparing For Surgery  Before surgery, you can play an important role. Because skin is not sterile, your skin needs to be as free of germs as possible. You can reduce the number of germs on your skin by washing with CHG (chlorahexidine gluconate) Soap before surgery.  CHG is an antiseptic cleaner which kills germs and bonds with the skin to continue killing germs even after washing.    Oral Hygiene is also important to reduce your risk of infection.  Remember - BRUSH YOUR TEETH THE MORNING OF SURGERY WITH YOUR REGULAR TOOTHPASTE  Please do not use if you have an allergy to CHG or antibacterial soaps. If your skin becomes reddened/irritated stop using the CHG.  Do not shave (including legs and underarms) for at least 48 hours prior to first CHG shower. It is OK to shave your face.  Please follow these instructions carefully.   1. Shower the NIGHT BEFORE SURGERY and the MORNING OF SURGERY  2. If you chose to wash your hair, wash your hair first as usual with your normal shampoo.  3. After you shampoo, rinse your hair and body thoroughly to remove the shampoo.  4. Wash Face and genitals (private parts) with your normal soap.   5.  Shower the NIGHT BEFORE SURGERY and the MORNING OF SURGERY with CHG Soap.   6. Use CHG Soap as you would any other liquid soap. You can apply CHG directly to the skin and wash gently with a scrungie or a clean washcloth.   7. Apply the CHG Soap to your body ONLY FROM THE NECK DOWN.  Do not use on open wounds or open sores. Avoid contact with your  eyes, ears, mouth and genitals (private parts). Wash Face and genitals (private parts)  with your normal soap.   8. Wash thoroughly, paying special attention to the area where your surgery will be performed.  9. Thoroughly rinse your body with warm water from the neck down.  10. DO NOT shower/wash with your normal soap after using and rinsing off the CHG Soap.  11. Pat yourself dry with a CLEAN TOWEL.  12. Wear CLEAN PAJAMAS to bed the night before surgery  13.  Place CLEAN SHEETS on your bed the night before your surgery  14. DO NOT SLEEP WITH PETS.   Day of Surgery: Take a shower.  Wear Clean/Comfortable clothing the morning of surgery Do not apply any deodorants/lotions.   Remember to brush your teeth WITH YOUR REGULAR TOOTHPASTE.   Please read over the following fact sheets that you were given.

## 2020-12-10 NOTE — Assessment & Plan Note (Signed)
COPD appears well controlled on triple therapy.  Patient's has significant perceived clinical benefit.  No recent flares.  Continue on current regimen.  Chest x-ray is pending. Patient is independent.  Is on no oxygen.  Denies any limitations due to her breathing.  Plan  Patient Instructions  Chest x-ray today Continue on BREZTRI 2 puffs Twice daily  , rinse after use.  Activity as tolerated.  Albuterol As needed   Restart Flutter valve Twice daily   Restart Vest therapy daily .  Follow up with [facility] in 4 months and As needed

## 2020-12-10 NOTE — Progress Notes (Signed)
  ID: Desiree [name], female    DOB: 1950/11/11, 70 y.o.   MRN: [id number]  Chief Complaint  Patient presents with  . Follow-up    Referring provider: Madelin Headings, MD  HPI: 70 year old female followed for COPD and bronchiectasis patient  TEST/EVENTS :  01/21/2018 fungus-yeast 01/21/1218 respiratory-yeast 01/21/2018 AFB-negative   Chest Imaging- films reviewed: CTA 10/09/2018-centrilobular nodules. Dependent groundglass opacification. Airway thickening and mild bronchiectasis most notable in the left lower lobe greater than right lower lobe. Minimal bronchiectasis peripherally in the RML. Peripheral linear opacity in the lingula. Mildhilar adenopathy.  CXR, 1 view 01/14/2020-no opacities or masses.  Prominent right pulmonary artery.  Likely arthritic rib 1 sterno costal joint- unchanged in appearance since 11/07/2018.  CT C-Spine 01/14/2020-no underlying lung lesion beneath sternoclavicular joint.  Echocardiogram10/04/2018:LVEF 60 to 65%, no regional wall motion abnormalities. Grade 1 diastolic dysfunction. Mild MR.Normal LA,RV, RA.  Pulmonary function test October 24, 2017 FEV1 74%, ratio 76, FVC 76%, no significant bronchodilator response, DLCO 59%.  12/10/2020 Follow up : COPD, bronchiectasis, preop pulmonary risk assessment Patient returns for a 32-month follow-up.  Patient has underlying COPD and bronchiectasis.  She has a Teacher, early years/pre but does not use it . Has flutter valve but does not use.  She remains on BREZTRI . Says it has really helped her a lot .  Says she has been feeling very good lately except for her hip. No albuterol use. No recent flare in breathing. No antibiotics or steroid use. Not on oxygen. O2 sats today 97% on room air.  Patient has some chronic allergies and is on Singulair and Zyrtec daily.  She also uses Flonase as needed Chest x-ray last was March 2021 that showed no acute process  Patient has plans for upcoming right total hip surgery. Limited  from back and hip pain  Lives alone, drives. Does light chores. No limitations from breathing . Denies dyspnea.  Rare cough . No increased congestion .  Dr Ophelia Charter ortho. Walks with cane   Allergies  Allergen Reactions  . Prednisone Shortness Of Breath, Itching, Nausea And Vomiting and Palpitations  . Solu-Medrol [Methylprednisolone] Anaphylaxis  . Amoxicillin Hives and Rash    Has patient had a PCN reaction causing immediate rash, facial/tongue/throat swelling, SOB or lightheadedness with hypotension:Yes Has patient had a PCN reaction causing severe rash involving mucus membranes or skin necrosis: No Has patient had a PCN reaction that required hospitalization: No Has patient had a PCN reaction occurring within the last 10 years: No If all of the above answers are "NO", then may proceed with Cephalosporin use.   . Codeine Hives, Itching and Rash    Tolerated oxycodone and morphine previously  . Hydrocodone-Acetaminophen Hives, Itching and Rash    Tolerated oxycodone and morphine previously  . Penicillins Hives, Itching and Rash    ALLERGIC REACTION TO ORAL AMOXICILLIN Has patient had a PCN reaction causing immediate rash, facial/tongue/throat swelling, SOB or lightheadedness with hypotension: Yes Has patient had a PCN reaction causing severe rash involving mucus membranes or skin necrosis: No Has patient had a PCN reaction that required hospitalization: No Has patient had a PCN reaction occurring within the last 10 years: No If all of the above answers are "NO", then may proceed with Cephalosporin use.  . Tape Other (See Comments)    sore    Immunization History  Administered Date(s) Administered  . Fluad Quad(high Dose 65+) 07/09/2019, 07/09/2020  . H1N1 11/13/2008  . Influenza Whole 07/21/2008, 07/28/2009, 06/28/2010, 07/05/2011, 09/22/2012  .  Influenza, High Dose Seasonal PF 07/05/2017, 07/14/2018  . Influenza,inj,Quad PF,6+ Mos 07/11/2013, 07/03/2014, 06/23/2015, 06/29/2016   . PFIZER(Purple Top)SARS-COV-2 Vaccination 11/17/2019, 12/08/2019, 08/04/2020  . Pneumococcal Conjugate-13 12/19/2013  . Pneumococcal Polysaccharide-23 02/25/2009, 04/19/2015  . Td 02/02/2010  . Tdap 04/25/2016    Past Medical History:  Diagnosis Date  . Acute gastritis without bleeding 08/08/2018  . Anxiety   . Bipolar disorder (HCC)   . CANDIDIASIS, ESOPHAGEAL 07/28/2009   Qualifier: Diagnosis of  By: Fabian Sharp MD, Neta Mends   . Chronic kidney disease    CKD III  . Complication of anesthesia    was told she stopped breathing for one of her finger surgeries  . COPD (chronic obstructive pulmonary disease) (HCC)   . Depression   . Diabetes mellitus without complication (HCC)    no meds  . Dyspnea    At rest,and with activity  . FH: colonic polyps   . Fractured elbow    right   . GERD (gastroesophageal reflux disease)   . HH (hiatus hernia)   . History of carpal tunnel syndrome   . History of chest pain   . History of transfusion of packed red blood cells   . Hyperlipidemia   . Hypertension   . Mitral valve prolapse   . Neuroleptic-induced tardive dyskinesia   . Osteoarthritis of more than one site   . Pneumonia   . Seasonal allergies   . Stroke [facility])     Tobacco History: Social History   Tobacco Use  Smoking Status Former Smoker  . Packs/day: 2.00  . Years: 30.00  . Pack years: 60.00  . Types: Cigarettes  . Quit date: 10/23/2001  . Years since quitting: 19.1  Smokeless Tobacco Never Used   Counseling given: Not Answered   Outpatient Medications Prior to Visit  Medication Sig Dispense Refill  . Accu-Chek Softclix Lancets lancets Use to test blood sugar daily. Dx:e11.9 100 each 12  . acetaminophen (TYLENOL) 500 MG tablet Take 2 tablets (1,000 mg total) by mouth every 6 (six) hours as needed. (Patient taking differently: Take 1,000 mg by mouth every 6 (six) hours as needed for mild pain or headache.) 30 tablet 0  . Alcohol Swabs (B-D SINGLE USE SWABS REGULAR) PADS      . amLODipine (NORVASC) 5 MG tablet TAKE ONE TABLET BY MOUTH EVERY MORNING (Patient taking differently: Take 5 mg by mouth daily. TAKE ONE TABLET BY MOUTH EVERY MORNING) 30 tablet 2  . aspirin EC 81 MG tablet Take 81 mg by mouth daily.     Marland Kitchen azelastine (ASTELIN) 0.1 % nasal spray Place 2 sprays into both nostrils 2 (two) times daily. Use in each nostril as directed 30 mL 12  . Blood Glucose Calibration (ACCU-CHEK GUIDE CONTROL) LIQD     . Blood Pressure Monitoring (B-D ASSURE BPM/AUTO ARM CUFF) MISC Use to check blood pressure as directed. 1 each 0  . Budeson-Glycopyrrol-Formoterol (BREZTRI AEROSPHERE) 160-9-4.8 MCG/ACT AERO Inhale 2 puffs into the lungs in the morning and at bedtime. 10.7 g 0  . Carboxymethylcellulose Sodium (THERATEARS) 0.25 % SOLN Place 1 drop into both eyes 3 (three) times daily as needed (dry eyes).    . cetirizine (ZYRTEC) 10 MG tablet Take 10 mg by mouth at bedtime.     . diclofenac sodium (VOLTAREN) 1 % GEL APPLY 2-4 GRAMS TO AFFECTED JOINTS UP TO FOUR TIMES DAILY (Patient taking differently: Apply 2-4 g topically 4 (four) times daily as needed (pain).) 400 g 2  . famotidine (  PEPCID) 40 MG tablet Take 1 tablet (40 mg total) by mouth daily. 90 tablet 0  . feeding supplement, ENSURE ENLIVE, (ENSURE ENLIVE) LIQD Take 237 mLs by mouth 3 (three) times daily between meals. 14 Bottle 0  . fluticasone (FLONASE) 50 MCG/ACT nasal spray Place 2 sprays into both nostrils at bedtime.    Marland Kitchen glucose blood (ACCU-CHEK AVIVA PLUS) test strip Use to test blood sugar once daily. Dx: e11.9 100 each 12  . linaclotide (LINZESS) 145 MCG CAPS capsule Take 145 mcg by mouth daily.    Marland Kitchen losartan (COZAAR) 25 MG tablet TAKE 1 TABLET(25 MG) BY MOUTH DAILY (Patient taking differently: Take 25 mg by mouth daily. TAKE 1 TABLET(25 MG) BY MOUTH DAILY) 90 tablet 1  . montelukast (SINGULAIR) 10 MG tablet TAKE 1 TABLET(10 MG) BY MOUTH AT BEDTIME (Patient taking differently: Take 10 mg by mouth at bedtime. TAKE 1  TABLET(10 MG) BY MOUTH AT BEDTIME) 30 tablet 5  . Multiple Vitamins-Minerals (CENTRUM SILVER 50+WOMEN) TABS Take 1 tablet by mouth daily.    . nitroGLYCERIN (NITRO-BID) 2 % ointment APPLY 1/4 INCH TOPICALLY TO AFFECTED FINGERTIPS 3 TIMES A DAY (Patient taking differently: Apply 0.25 inches topically 3 (three) times daily. APPLY 1/4 INCH TOPICALLY TO AFFECTED FINGERTIPS 3 TIMES A DAY) 30 g 0  . ondansetron (ZOFRAN-ODT) 4 MG disintegrating tablet DISSOLVE 1 TABLET(4 MG) ON THE TONGUE EVERY 8 HOURS AS NEEDED FOR NAUSEA OR VOMITING (Patient taking differently: Take 4 mg by mouth every 8 (eight) hours as needed for nausea or vomiting.) 20 tablet 0  . Respiratory Therapy Supplies (FLUTTER) DEVI 1 Device by Does not apply route as directed. 1 each 0  . RESTASIS 0.05 % ophthalmic emulsion Place 1 drop into both eyes 2 (two) times daily.    Marland Kitchen triamcinolone cream (KENALOG) 0.1 % Apply 1 application topically daily as needed (for itching of affected areas).   0   No facility-administered medications prior to visit.     Review of Systems:   Constitutional:   No  weight loss, night sweats,  Fevers, chills, fatigue, or  lassitude.  HEENT:   No headaches,  Difficulty swallowing,  Tooth/dental problems, or  Sore throat,                No sneezing, itching, ear ache,  +nasal congestion, post nasal drip,   CV:  No chest pain,  Orthopnea, PND, swelling in lower extremities, anasarca, dizziness, palpitations, syncope.   GI  No heartburn, indigestion, abdominal pain, nausea, vomiting, diarrhea, change in bowel habits, loss of appetite, bloody stools.   Resp:   No chest wall deformity  Skin: no rash or lesions.  GU: no dysuria, change in color of urine, no urgency or frequency.  No flank pain, no hematuria   MS:  No joint pain or swelling.  No decreased range of motion.  No back pain.    Physical Exam  BP 116/74 (BP Location: Left Arm, Cuff Size: Normal)   Pulse 88   Temp (!) 97.3 F (36.3 C)   Ht   (1.575 m)   Wt 160 lb (72.6 kg)   SpO2 97%   BMI 29.26 kg/m   GEN: A/Ox3; pleasant , NAD, well nourished    HEENT:  [place]/AT,    NOSE-clear, THROAT-clear, no lesions, no postnasal drip or exudate noted.   NECK:  Supple w/ fair ROM; no JVD; normal carotid impulses w/o bruits; no thyromegaly or nodules palpated; no lymphadenopathy.    RESP  Clear  P & A; w/o, wheezes/ rales/ or rhonchi. no accessory muscle use, no dullness to percussion  CARD:  RRR, no m/r/g, no peripheral edema, pulses intact, no cyanosis or clubbing.  GI:   Soft & nt; nml bowel sounds; no organomegaly or masses detected.   Musco: Warm bil, no deformities or joint swelling noted.   Neuro: alert, no focal deficits noted.    Skin: Warm, no lesions or rashes    Lab Results:    BNP No results found for: BNP   Imaging: No results found.    PFT Results Latest Ref Rng & Units 10/24/2017  FVC-Pre L 1.87  FVC-Predicted Pre % 79  FVC-Post L 1.79  FVC-Predicted Post % 76  Pre FEV1/FVC % % 72  Post FEV1/FCV % % 76  FEV1-Pre L 1.34  FEV1-Predicted Pre % 73  FEV1-Post L 1.36  DLCO uncorrected ml/min/mmHg 13.30  DLCO UNC% % 59  DLCO corrected ml/min/mmHg 13.39  DLCO COR %Predicted % 60  DLVA Predicted % 87  TLC L 6.19  TLC % Predicted % 128  RV % Predicted % 179    No results found for: NITRICOXIDE      Assessment & Plan:   Bronchiectasis without complication (HCC) Mild bronchiectasis on previous CT chest.  Patient has had minimum symptoms no recent antibiotic or steroid use.  Clinically she appears to be stable. Chest x-ray today. I have encouraged her on mucociliary clearance.  She is planning for upcoming surgery.  Would definitely restart flutter valve and vest therapy post surgery.   Plan  Patient Instructions  Chest x-ray today Continue on BREZTRI 2 puffs Twice daily  , rinse after use.  Activity as tolerated.  Albuterol As needed   Restart Flutter valve Twice daily   Restart Vest  therapy daily .  Follow up with [facility] in 4 months and As needed         COPD mixed type (HCC) COPD appears well controlled on triple therapy.  Patient's has significant perceived clinical benefit.  No recent flares.  Continue on current regimen.  Chest x-ray is pending. Patient is independent.  Is on no oxygen.  Denies any limitations due to her breathing.  Plan  Patient Instructions  Chest x-ray today Continue on BREZTRI 2 puffs Twice daily  , rinse after use.  Activity as tolerated.  Albuterol As needed   Restart Flutter valve Twice daily   Restart Vest therapy daily .  Follow up with [facility] in 4 months and As needed        Encounter for preoperative assessment Pulmonary preop assessment. Low to moderate risk , discussed with patient .   1A) Arozullah - Prolonged mech ventilation risk Arozullah Postperative Pulmonary Risk Score - for mech ventilation dependence >48h USAA, Ann Surg 2000, major non-cardiac surgery) Comment Score  Type of surgery - abd ao aneurysm (27), thoracic (21), neurosurgery / upper abdominal / vascular (21), neck (11) Hip  7  Emergency Surgery - (11)  0  ALbumin < 3 or poor nutritional state - (9)  0  BUN > 30 -  (8)  0  Partial or completely dependent functional status - (7)  0  COPD -  (6)  6  Age - 60 to 69 (4), > 70  (6)  4  TOTAL    Risk Stratifcation scores  - < 10 (0.5%), 11-19 (1.8%), 20-27 (4.2%), 28-40 (10.1%), >40 (26.6%)  17       Major Pulmonary risks  identified in the multifactorial risk analysis are but not limited to a) pneumonia; b) recurrent intubation risk; c) prolonged or recurrent acute respiratory failure needing mechanical ventilation; d) prolonged hospitalization; e) DVT/Pulmonary embolism; f) Acute Pulmonary edema  Recommend Short duration of surgery as much as possible and avoid paralytic if possible  DVT prophylaxis as indicated   Aggressive pulmonary toilet with o2, bronchodilatation, and incentive  spirometry and early ambulation Would take inhaler prior to surgery .  Advised patient to restart flutter valve and vest therapy           Rubye Oaks, NP 12/10/2020

## 2020-12-10 NOTE — Assessment & Plan Note (Signed)
Pulmonary preop assessment. Low to moderate risk , discussed with patient .   1A) Arozullah - Prolonged mech ventilation risk Arozullah Postperative Pulmonary Risk Score - for mech ventilation dependence >48h USAA, Ann Surg 2000, major non-cardiac surgery) Comment Score  Type of surgery - abd ao aneurysm (27), thoracic (21), neurosurgery / upper abdominal / vascular (21), neck (11) Hip  7  Emergency Surgery - (11)  0  ALbumin < 3 or poor nutritional state - (9)  0  BUN > 30 -  (8)  0  Partial or completely dependent functional status - (7)  0  COPD -  (6)  6  Age - 60 to 63 (4), > 70  (6)  4  TOTAL    Risk Stratifcation scores  - < 10 (0.5%), 11-19 (1.8%), 20-27 (4.2%), 28-40 (10.1%), >40 (26.6%)  17       Major Pulmonary risks identified in the multifactorial risk analysis are but not limited to a) pneumonia; b) recurrent intubation risk; c) prolonged or recurrent acute respiratory failure needing mechanical ventilation; d) prolonged hospitalization; e) DVT/Pulmonary embolism; f) Acute Pulmonary edema  Recommend Short duration of surgery as much as possible and avoid paralytic if possible  DVT prophylaxis as indicated   Aggressive pulmonary toilet with o2, bronchodilatation, and incentive spirometry and early ambulation Would take inhaler prior to surgery .  Advised patient to restart flutter valve and vest therapy

## 2020-12-13 ENCOUNTER — Encounter (HOSPITAL_COMMUNITY)
Admission: RE | Admit: 2020-12-13 | Discharge: 2020-12-13 | Disposition: A | Discharge status: Home / Self Care | Payer: Medicare HMO | Source: Ambulatory Visit | Attending: Orthopaedic Surgery | Admitting: Orthopaedic Surgery

## 2020-12-13 ENCOUNTER — Encounter (HOSPITAL_COMMUNITY): Payer: Self-pay

## 2020-12-13 ENCOUNTER — Encounter: Payer: Self-pay | Admitting: *Deleted

## 2020-12-13 ENCOUNTER — Other Ambulatory Visit: Payer: Self-pay

## 2020-12-13 DIAGNOSIS — Z01812 Encounter for preprocedural laboratory examination: Secondary | ICD-10-CM | POA: Diagnosis not present

## 2020-12-13 HISTORY — DX: Unspecified asthma, uncomplicated: J45.909

## 2020-12-13 HISTORY — DX: Anemia, unspecified: D64.9

## 2020-12-13 HISTORY — DX: Headache, unspecified: R51.9

## 2020-12-13 LAB — SURGICAL PCR SCREEN
MRSA, PCR: NEGATIVE
Staphylococcus aureus: NEGATIVE

## 2020-12-13 LAB — CBC
HCT: 41.5 % (ref 36.0–46.0)
Hemoglobin: 14.3 g/dL (ref 12.0–15.0)
MCH: 31.5 pg (ref 26.0–34.0)
MCHC: 34.5 g/dL (ref 30.0–36.0)
MCV: 91.4 fL (ref 80.0–100.0)
Platelets: 299 10*3/uL (ref 150–400)
RBC: 4.54 MIL/uL (ref 3.87–5.11)
RDW: 13.8 % (ref 11.5–15.5)
WBC: 8.9 10*3/uL (ref 4.0–10.5)
nRBC: 0 % (ref 0.0–0.2)

## 2020-12-13 LAB — GLUCOSE, CAPILLARY: Glucose-Capillary: 113 mg/dL — ABNORMAL HIGH (ref 70–99)

## 2020-12-13 LAB — BASIC METABOLIC PANEL
Anion gap: 7 (ref 5–15)
BUN: 21 mg/dL (ref 8–23)
CO2: 29 mmol/L (ref 22–32)
Calcium: 9.4 mg/dL (ref 8.9–10.3)
Chloride: 103 mmol/L (ref 98–111)
Creatinine, Ser: 1.65 mg/dL — ABNORMAL HIGH (ref 0.44–1.00)
GFR, Estimated: 34 mL/min — ABNORMAL LOW (ref >60)
Glucose, Bld: 111 mg/dL — ABNORMAL HIGH (ref 70–99)
Potassium: 3.9 mmol/L (ref 3.5–5.1)
Sodium: 139 mmol/L (ref 135–145)

## 2020-12-13 LAB — TYPE AND SCREEN
ABO/RH(D): A POS
Antibody Screen: NEGATIVE

## 2020-12-13 LAB — HEMOGLOBIN A1C
Hgb A1c MFr Bld: 5.9 % — ABNORMAL HIGH (ref 4.8–5.6)
Mean Plasma Glucose: 122.63 mg/dL

## 2020-12-13 NOTE — Pre-Procedure Instructions (Signed)
Surgical Instructions:    Your procedure is scheduled on Friday, 12/17/20 (07:30 AM- 09:41 AM).  Report to [facility] Main Entrance "A" at 05:30 A.M., then check in with the Admitting office.  Call this number if you have problems the morning of surgery:  [phone]   If you have any questions prior to your surgery date call [phone]: Open Monday-Friday 8am-4pm.    Remember:  Do not eat after midnight the night before your surgery.  You may drink clear liquids until 04:30 AM the morning of your surgery.   Clear liquids allowed are: Water, Non-Citrus Juices (without pulp), Carbonated Beverages, Clear Tea, Black Coffee Only, and Gatorade.  Please complete your PRE-SURGERY 10 oz WATER that was provided to you by 04:30 AM the morning of surgery.  Please, if able, drink it in one setting. DO NOT SIP.     Take these medicines the morning of surgery with A SIP OF WATER: amLODipine (NORVASC)  famotidine (PEPCID) linaclotide (LINZESS)  azelastine (ASTELIN) nasal spray RESTASIS eye drops Budeson-Glycopyrrol-Formoterol (BREZTRI AEROSPHERE) inhaler  IF NEEDED: acetaminophen (TYLENOL) ondansetron (ZOFRAN-ODT) Carboxymethylcellulose Sodium (THERATEARS) eye drops   *Follow your surgeon's instructions on when to stop Aspirin.  If no instructions were given by your surgeon then you will need to call the office to get those instructions.     As of today, STOP taking any diclofenac sodium (VOLTAREN) GEL, Aleve, Naproxen, Ibuprofen, Motrin, Advil, Goody's, BC's, all herbal medications, fish oil, and all vitamins.         HOW TO MANAGE YOUR DIABETES BEFORE AND AFTER SURGERY  Why is it important to control my blood sugar before and after surgery? . Improving blood sugar levels before and after surgery helps healing and can limit problems. . A way of improving blood sugar control is eating a healthy diet by: o  Eating less sugar and carbohydrates o  Increasing activity/exercise o   Talking with your doctor about reaching your blood sugar goals . High blood sugars (greater than 180 mg/dL) can raise your risk of infections and slow your recovery, so you will need to focus on controlling your diabetes during the weeks before surgery. . Make sure that the doctor who takes care of your diabetes knows about your planned surgery including the date and location.  How do I manage my blood sugar before surgery? . Check your blood sugar at least 4 times a day, starting 2 days before surgery, to make sure that the level is not too high or low. . Check your blood sugar the morning of your surgery when you wake up and every 2 hours until you get to the Short Stay unit. o If your blood sugar is less than 70 mg/dL, you will need to treat for low blood sugar: - Do not take insulin. - Treat a low blood sugar (less than 70 mg/dL) with  cup of clear juice (cranberry or apple), 4 glucose tablets, OR glucose gel. - Recheck blood sugar in 15 minutes after treatment (to make sure it is greater than 70 mg/dL). If your blood sugar is not greater than 70 mg/dL on recheck, call [phone] for further instructions. . Report your blood sugar to the short stay nurse when you get to Short Stay.  . If you are admitted to the hospital after surgery: o Your blood sugar will be checked by the staff and you will probably be given insulin after surgery (instead of oral diabetes medicines) to make sure you have good blood sugar  levels. o The goal for blood sugar control after surgery is 80-180 mg/dL.   The Morning of Surgery:               Do not wear jewelry, make up, or nail polish.            Do not wear lotions, powders, perfumes, or deodorant.            Do not shave 48 hours prior to surgery.              Do not bring valuables to the hospital.            [facility] is not responsible for any belongings or valuables.  Do NOT Smoke (Tobacco/Vaping) or drink Alcohol 24 hours prior to your  procedure.  If you use a CPAP at night, you may bring all equipment for your overnight stay.   Contacts, glasses, dentures or bridgework may not be worn into surgery, please bring cases for these belongings   For patients admitted to the hospital, discharge time will be determined by your treatment team.   Patients discharged the day of surgery will not be allowed to drive home, and someone needs to stay with them for 24 hours.    Special instructions:   [place]- Preparing For Surgery  Before surgery, you can play an important role. Because skin is not sterile, your skin needs to be as free of germs as possible. You can reduce the number of germs on your skin by washing with CHG (chlorahexidine gluconate) Soap before surgery.  CHG is an antiseptic cleaner which kills germs and bonds with the skin to continue killing germs even after washing.    Oral Hygiene is also important to reduce your risk of infection.  Remember - BRUSH YOUR TEETH THE MORNING OF SURGERY WITH YOUR REGULAR TOOTHPASTE  Please do not use if you have an allergy to CHG or antibacterial soaps. If your skin becomes reddened/irritated stop using the CHG.  Do not shave (including legs and underarms) for at least 48 hours prior to first CHG shower. It is OK to shave your face.  Please follow these instructions carefully.   1. Shower the NIGHT BEFORE SURGERY and the MORNING OF SURGERY  2. If you chose to wash your hair, wash your hair first as usual with your normal shampoo.  3. After you shampoo, rinse your hair and body thoroughly to remove the shampoo.  4. Wash Face and genitals (private parts) with your normal soap.   5.  Shower the NIGHT BEFORE SURGERY and the MORNING OF SURGERY with CHG Soap.   6. Use CHG Soap as you would any other liquid soap. You can apply CHG directly to the skin and wash gently with a scrungie or a clean washcloth.   7. Apply the CHG Soap to your body ONLY FROM THE NECK DOWN.  Do not use  on open wounds or open sores. Avoid contact with your eyes, ears, mouth and genitals (private parts). Wash Face and genitals (private parts)  with your normal soap.   8. Wash thoroughly, paying special attention to the area where your surgery will be performed.  9. Thoroughly rinse your body with warm water from the neck down.  10. DO NOT shower/wash with your normal soap after using and rinsing off the CHG Soap.  11. Pat yourself dry with a CLEAN TOWEL.  12. Wear CLEAN PAJAMAS to bed the night before surgery  13. Place CLEAN  SHEETS on your bed the night before your surgery  14. DO NOT SLEEP WITH PETS.   Day of Surgery: SHOWER Wear Clean/Comfortable clothing the morning of surgery Do not apply any deodorants/lotions.   Remember to brush your teeth WITH YOUR REGULAR TOOTHPASTE.   Please read over the following fact sheets that you were given.

## 2020-12-13 NOTE — Progress Notes (Signed)
PCP - Berniece Andreas, MD Cardiologist - Charlton Haws, MD Pulmonologist- Karie Fetch, DO Neurologist- Orpah Cobb, MD Hematologist- Drue Second, MD Rheumatologist- [name], MD  PPM/ICD - Denies  Chest x-ray - 12/10/20 EKG - 05/12/20 Stress Test - 01/06/15 ECHO - 05/31/20 Cardiac Cath - Denies  Sleep Study - Denies   Fasting Blood Sugar  Low 100s Checks Blood Sugar x1 weekly. CBG at PAT appt was 113. A1C obtained.  Blood Thinner Instructions: N/A Aspirin Instructions: last dose 12/13/20  ERAS Protcol - Yes PRE-SURGERY Ensure or G2- 10 oz water given  COVID TEST- 12/14/20   Anesthesia review: Yes, pulm. Clearance 12/10/20; cardiac clearance 12/09/20  Patient denies shortness of breath, fever, cough and chest pain at PAT appointment   All instructions explained to the patient, with a verbal understanding of the material. Patient agrees to go over the instructions while at home for a better understanding. Patient also instructed to self quarantine after being tested for COVID-19. The opportunity to ask questions was provided.

## 2020-12-14 ENCOUNTER — Telehealth: Payer: Self-pay | Admitting: *Deleted

## 2020-12-14 ENCOUNTER — Other Ambulatory Visit (HOSPITAL_COMMUNITY)
Admission: RE | Admit: 2020-12-14 | Discharge: 2020-12-14 | Disposition: A | Discharge status: Home / Self Care | Payer: Medicare HMO | Source: Ambulatory Visit | Attending: Orthopaedic Surgery | Admitting: Orthopaedic Surgery

## 2020-12-14 DIAGNOSIS — Z20822 Contact with and (suspected) exposure to covid-19: Secondary | ICD-10-CM | POA: Insufficient documentation

## 2020-12-14 DIAGNOSIS — Z01812 Encounter for preprocedural laboratory examination: Secondary | ICD-10-CM | POA: Insufficient documentation

## 2020-12-14 LAB — SARS CORONAVIRUS 2 (TAT 6-24 HRS): SARS Coronavirus 2: NEGATIVE

## 2020-12-14 NOTE — Anesthesia Preprocedure Evaluation (Addendum)
Anesthesia Evaluation  Patient identified by MRN, date of birth, ID band Patient awake    Reviewed: Allergy & Precautions, NPO status , Patient's Chart, lab work & pertinent test results  Airway Mallampati: II  TM Distance: >3 FB Neck ROM: Full    Dental  (+) Edentulous Upper, Partial Lower, Dental Advisory Given   Pulmonary former smoker,    breath sounds clear to auscultation       Cardiovascular hypertension,  Rhythm:Regular Rate:Normal     Neuro/Psych    GI/Hepatic   Endo/Other  diabetes  Renal/GU      Musculoskeletal   Abdominal   Peds  Hematology   Anesthesia Other Findings   Reproductive/Obstetrics                            Anesthesia Physical Anesthesia Plan  ASA: III  Anesthesia Plan: General   Post-op Pain Management:    Induction: Intravenous  PONV Risk Score and Plan: Ondansetron and Dexamethasone  Airway Management Planned: Oral ETT  Additional Equipment:   Intra-op Plan:   Post-operative Plan: Extubation in OR  Informed Consent: I have reviewed the patients History and Physical, chart, labs and discussed the procedure including the risks, benefits and alternatives for the proposed anesthesia with the patient or authorized representative who has indicated his/her understanding and acceptance.     Dental advisory given  Plan Discussed with: Anesthesiologist and CRNA  Anesthesia Plan Comments: (PAT note by Antionette Poles, PA-C: Follows with pulmonology for history of COPD and mild bronchiectasis.  Seen 12/10/2020 for preop assessment.  Per note, "COPD appears well controlled on triple therapy.  Patient's has significant perceived clinical benefit.  No recent flares.  Continue on current regimen.  Chest x-ray is pending. Patient is independent.  Is on no oxygen.  Denies any limitations due to her breathing."  She was cleared for surgery at low to moderate  risk.  Follows with cardiology for history of atypical chest pain with normal Myoview in 2016, HTN with LVH.  Cardiac clearance per telephone encounter 12/09/2020, "Chart reviewed as part of pre-operative protocol coverage. Given past medical history and time since last visit, based on ACC/AHA guidelines, LAURRIE TOPPIN would be at acceptable risk for the planned procedure without further cardiovascular testing. Pt was seen by cardiology 04/2020 and 06/2020 at which time she was cleared for surgery. Since that time she has had no concerning anginal symptoms. 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  She uses topical nitroglycerin ointment.  History of C5-6 ACDF.  Diet-controlled DM2, A1c 5.9 on 12/13/2020.  Preop labs reviewed, creatinine elevated 1.65 consistent with history of CKD 3, otherwise unremarkable.  EKG 05/12/2020: Sinus rhythm with LVH.  Rate 84.  Poor R wave progression.  No changes.  CHEST - 2 VIEW 12/10/2020: COMPARISON:  01/14/2020  FINDINGS: Normal heart size, mediastinal contours, and pulmonary vascularity.  Lungs clear.  No pulmonary infiltrate, pleural effusion, or pneumothorax.  Degenerative changes at the shoulders and throughout thoracic spine.  Prior cervical and lumbar fusions with levoconvex thoracic scoliosis.  IMPRESSION: No acute abnormalities.  PFT 10/24/2017: FVC-%Pred-Pre Latest Units: % 79 FEV1-%Pred-Pre Latest Units: % 73 FEV1FVC-%Pred-Pre Latest Units: % 91 TLC % pred Latest Units: % 128  RV % pred Latest Units: % 179 DLCO unc % pred Latest Units: % 59   TTE 05/31/2020: 1. Left ventricular ejection fraction, by estimation, is 70 to 75%. The  left ventricle has hyperdynamic function. The left ventricle has no  regional wall  motion abnormalities. There is moderate left ventricular  hypertrophy of the septal segment. Left  ventricular diastolic parameters are consistent with Grade I diastolic  dysfunction (impaired relaxation). The average left ventricular global  longitudinal strain is 10.8 %.  2. Right ventricular systolic function is normal. The right ventricular  size is normal.  3. The mitral valve is normal in structure. Mild mitral valve  regurgitation. No evidence of mitral stenosis.  4. The aortic valve is normal in structure. Aortic valve regurgitation is  not visualized. No aortic stenosis is present.  5. The inferior vena cava is normal in size with greater than 50%  respiratory variability, suggesting right atrial pressure of 3 mmHg.   Nuclear stress 01/05/2015:  Overall Impression:  Low risk stress nuclear study with fixed inferolateral defect, which is likely attenuation artifact -adjacent hot bowel is noted. No significant ischemia. LV Ejection Fraction: 70%. LV Wall Motion: Normal Wall Motion. )       Anesthesia Quick Evaluation

## 2020-12-14 NOTE — Care Plan (Signed)
RNCM met with patient in office during her H&P appointment with Benjiman Core, PA-C for Dr. Lorin Mercy. Also spoke with patient today via telephone. Patient is an Ortho bundle patient through [facility] and is agreeable to case management. Answered questions related to surgery and hospitalization as well as post-op care instructions. She verbalized she has a friend that will be assisting her at home after surgery. She will need a FWW as well as 3in1/BSC. These will be ordered through [employer] to be delivered to her hospital room prior to discharge. Will order HHPT if MD feels she needs after surgery. Will continue to follow for needs.

## 2020-12-14 NOTE — Telephone Encounter (Signed)
Ortho bundle pre-op call completed. 

## 2020-12-14 NOTE — Progress Notes (Signed)
Anesthesia Chart Review:  Follows with pulmonology for history of COPD and mild bronchiectasis.  Seen 12/10/2020 for preop assessment.  Per note, "COPD appears well controlled on triple therapy.  Patient's has significant perceived clinical benefit.  No recent flares.  Continue on current regimen.  Chest x-ray is pending. Patient is independent.  Is on no oxygen.  Denies any limitations due to her breathing."  She was cleared for surgery at low to moderate risk.  Follows with cardiology for history of atypical chest pain with normal Myoview in 2016, HTN with LVH.  Cardiac clearance per telephone encounter 12/09/2020, "Chart reviewed as part of pre-operative protocol coverage. Given past medical history and time since last visit, based on ACC/AHA guidelines, Desiree [name] would be at acceptable risk for the planned procedure without further cardiovascular testing. Pt was seen by cardiology 04/2020 and 06/2020 at which time she was cleared for surgery. Since that time she has had no concerning anginal symptoms. 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  She uses topical nitroglycerin ointment.  History of C5-6 ACDF.  Diet-controlled DM2, A1c 5.9 on 12/13/2020.  Preop labs reviewed, creatinine elevated 1.65 consistent with history of CKD 3, otherwise unremarkable.  EKG 05/12/2020: Sinus rhythm with LVH.  Rate 84.  Poor R wave progression.  No changes.  CHEST - 2 VIEW 12/10/2020: COMPARISON:  01/14/2020  FINDINGS: Normal heart size, mediastinal contours, and pulmonary vascularity.  Lungs clear.  No pulmonary infiltrate, pleural effusion, or pneumothorax.  Degenerative changes at the shoulders and throughout thoracic spine.  Prior cervical and lumbar fusions with levoconvex  thoracic scoliosis.  IMPRESSION: No acute abnormalities.  PFT 10/24/2017: FVC-%Pred-Pre Latest Units: % 79  FEV1-%Pred-Pre Latest Units: % 73  FEV1FVC-%Pred-Pre Latest Units: % 91  TLC % pred Latest Units: % 128  RV % pred Latest Units: % 179  DLCO unc % pred Latest Units: % 59    TTE 05/31/2020: 1. Left ventricular ejection fraction, by estimation, is 70 to 75%. The  left ventricle has hyperdynamic function. The left ventricle has no  regional wall motion abnormalities. There is moderate left ventricular  hypertrophy of the septal segment. Left  ventricular diastolic parameters are consistent with Grade I diastolic  dysfunction (impaired relaxation). The average left ventricular global  longitudinal strain is 10.8 %.  2. Right ventricular systolic function is normal. The right ventricular  size is normal.  3. The mitral valve is normal in structure. Mild mitral valve  regurgitation. No evidence of mitral stenosis.  4. The aortic valve is normal in structure. Aortic valve regurgitation is  not visualized. No aortic stenosis is present.  5. The inferior vena cava is normal in size with greater than 50%  respiratory variability, suggesting right atrial pressure of 3 mmHg.   Nuclear stress 01/05/2015:  Overall Impression:  Low risk stress nuclear study with fixed inferolateral defect, which is likely attenuation artifact -adjacent hot bowel is noted. No significant ischemia. LV Ejection Fraction: 70%. LV Wall Motion: Normal Wall Motion.  Zannie Cove [facility]/Anesthesiology Phone [phone] 12/14/2020 3:05 PM

## 2020-12-15 ENCOUNTER — Other Ambulatory Visit: Payer: Self-pay

## 2020-12-15 ENCOUNTER — Telehealth: Payer: Self-pay | Admitting: Pulmonary Disease

## 2020-12-15 ENCOUNTER — Ambulatory Visit (INDEPENDENT_AMBULATORY_CARE_PROVIDER_SITE_OTHER): Payer: Medicare HMO | Admitting: Neurology

## 2020-12-15 DIAGNOSIS — G8929 Other chronic pain: Secondary | ICD-10-CM

## 2020-12-15 DIAGNOSIS — Z72821 Inadequate sleep hygiene: Secondary | ICD-10-CM

## 2020-12-15 DIAGNOSIS — G4710 Hypersomnia, unspecified: Secondary | ICD-10-CM

## 2020-12-15 DIAGNOSIS — R0683 Snoring: Secondary | ICD-10-CM | POA: Diagnosis not present

## 2020-12-15 DIAGNOSIS — G2401 Drug induced subacute dyskinesia: Secondary | ICD-10-CM

## 2020-12-15 DIAGNOSIS — F5105 Insomnia due to other mental disorder: Secondary | ICD-10-CM

## 2020-12-15 MED ORDER — BREZTRI AEROSPHERE 160-9-4.8 MCG/ACT IN AERO: 2.0000 | INHALATION_SPRAY | Freq: Two times a day (BID) | RESPIRATORY_TRACT | 3 refills | Status: DC

## 2020-12-15 NOTE — Telephone Encounter (Signed)
Spoke with patient about message.   Called Dr. Ophelia Charter office, spoke with Eunice Blase to informed per Dr. Fabian Sharp  below message that patient can proceed with surgery.  Eunice Blase said will referral to message.  Nothing further needed

## 2020-12-15 NOTE — Telephone Encounter (Signed)
Called and spoke with pt and she stated that since she is no longer seeing Dr. Chestine Spore that the pt assistance that she was getting for breztri told her that they would need a new rx for this.  Rx has been printed out and placed in TP look at to be signed and faxed back to [place] [facility] [place].    Will forward to TP to make her aware rx is in the cabinet with the sign papers.

## 2020-12-17 ENCOUNTER — Ambulatory Visit (HOSPITAL_COMMUNITY): Payer: Medicare HMO

## 2020-12-17 ENCOUNTER — Ambulatory Visit (HOSPITAL_COMMUNITY): Payer: Medicare HMO | Admitting: Physician Assistant

## 2020-12-17 ENCOUNTER — Ambulatory Visit (HOSPITAL_COMMUNITY): Payer: Medicare HMO | Admitting: Anesthesiology

## 2020-12-17 ENCOUNTER — Encounter (HOSPITAL_COMMUNITY): Payer: Self-pay | Admitting: Orthopaedic Surgery

## 2020-12-17 ENCOUNTER — Other Ambulatory Visit: Payer: Self-pay

## 2020-12-17 ENCOUNTER — Encounter (HOSPITAL_COMMUNITY)
Admission: RE | Disposition: A | Discharge status: Home / Self Care | Payer: Self-pay | Source: Home / Self Care | Attending: Orthopaedic Surgery

## 2020-12-17 ENCOUNTER — Observation Stay (HOSPITAL_COMMUNITY): Payer: Medicare HMO

## 2020-12-17 ENCOUNTER — Inpatient Hospital Stay (HOSPITAL_COMMUNITY)
Admission: RE | Admit: 2020-12-17 | Discharge: 2020-12-21 | DRG: 470 | Disposition: A | Discharge status: Home / Self Care | Payer: Medicare HMO | Attending: Orthopaedic Surgery | Admitting: Orthopaedic Surgery

## 2020-12-17 ENCOUNTER — Other Ambulatory Visit: Payer: Self-pay | Admitting: *Deleted

## 2020-12-17 DIAGNOSIS — Z471 Aftercare following joint replacement surgery: Secondary | ICD-10-CM | POA: Diagnosis not present

## 2020-12-17 DIAGNOSIS — Z87891 Personal history of nicotine dependence: Secondary | ICD-10-CM | POA: Diagnosis not present

## 2020-12-17 DIAGNOSIS — Z981 Arthrodesis status: Secondary | ICD-10-CM | POA: Diagnosis not present

## 2020-12-17 DIAGNOSIS — N183 Chronic kidney disease, stage 3 unspecified: Secondary | ICD-10-CM | POA: Diagnosis present

## 2020-12-17 DIAGNOSIS — I129 Hypertensive chronic kidney disease with stage 1 through stage 4 chronic kidney disease, or unspecified chronic kidney disease: Secondary | ICD-10-CM | POA: Diagnosis present

## 2020-12-17 DIAGNOSIS — E1122 Type 2 diabetes mellitus with diabetic chronic kidney disease: Secondary | ICD-10-CM | POA: Diagnosis present

## 2020-12-17 DIAGNOSIS — Z96641 Presence of right artificial hip joint: Secondary | ICD-10-CM | POA: Diagnosis not present

## 2020-12-17 DIAGNOSIS — S79911A Unspecified injury of right hip, initial encounter: Secondary | ICD-10-CM | POA: Diagnosis not present

## 2020-12-17 DIAGNOSIS — E785 Hyperlipidemia, unspecified: Secondary | ICD-10-CM | POA: Diagnosis present

## 2020-12-17 DIAGNOSIS — Z96649 Presence of unspecified artificial hip joint: Secondary | ICD-10-CM

## 2020-12-17 DIAGNOSIS — T1490XA Injury, unspecified, initial encounter: Secondary | ICD-10-CM

## 2020-12-17 DIAGNOSIS — J441 Chronic obstructive pulmonary disease with (acute) exacerbation: Secondary | ICD-10-CM | POA: Diagnosis not present

## 2020-12-17 DIAGNOSIS — J449 Chronic obstructive pulmonary disease, unspecified: Secondary | ICD-10-CM | POA: Diagnosis present

## 2020-12-17 DIAGNOSIS — I10 Essential (primary) hypertension: Secondary | ICD-10-CM | POA: Diagnosis present

## 2020-12-17 DIAGNOSIS — Z8673 Personal history of transient ischemic attack (TIA), and cerebral infarction without residual deficits: Secondary | ICD-10-CM | POA: Diagnosis not present

## 2020-12-17 DIAGNOSIS — K219 Gastro-esophageal reflux disease without esophagitis: Secondary | ICD-10-CM | POA: Diagnosis present

## 2020-12-17 DIAGNOSIS — Z09 Encounter for follow-up examination after completed treatment for conditions other than malignant neoplasm: Secondary | ICD-10-CM

## 2020-12-17 DIAGNOSIS — Z79899 Other long term (current) drug therapy: Secondary | ICD-10-CM | POA: Diagnosis not present

## 2020-12-17 DIAGNOSIS — M1611 Unilateral primary osteoarthritis, right hip: Secondary | ICD-10-CM | POA: Diagnosis present

## 2020-12-17 DIAGNOSIS — K449 Diaphragmatic hernia without obstruction or gangrene: Secondary | ICD-10-CM | POA: Diagnosis not present

## 2020-12-17 HISTORY — PX: TOTAL HIP ARTHROPLASTY: SHX124

## 2020-12-17 LAB — GLUCOSE, CAPILLARY
Glucose-Capillary: 96 mg/dL (ref 70–99)
Glucose-Capillary: 72 mg/dL (ref 70–99)
Glucose-Capillary: 51 mg/dL — ABNORMAL LOW (ref 70–99)
Glucose-Capillary: 109 mg/dL — ABNORMAL HIGH (ref 70–99)
Glucose-Capillary: 125 mg/dL — ABNORMAL HIGH (ref 70–99)

## 2020-12-17 SURGERY — ARTHROPLASTY, HIP, TOTAL, ANTERIOR APPROACH
Anesthesia: General | Site: Hip | Laterality: Right

## 2020-12-17 MED ORDER — FLUTTER DEVI: 1.0000 | Status: DC

## 2020-12-17 MED ORDER — PHENOL 1.4 % MT LIQD: 1.0000 | OROMUCOSAL | Status: DC | PRN

## 2020-12-17 MED ORDER — ROCURONIUM BROMIDE 10 MG/ML (PF) SYRINGE
PREFILLED_SYRINGE | INTRAVENOUS | Status: AC
  Filled 2020-12-17: qty 10

## 2020-12-17 MED ORDER — MENTHOL 3 MG MT LOZG: 1.0000 | LOZENGE | OROMUCOSAL | Status: DC | PRN

## 2020-12-17 MED ORDER — AMLODIPINE BESYLATE 5 MG PO TABS
5.0000 mg | ORAL_TABLET | Freq: Every day | ORAL | Status: DC
  Administered 2020-12-18 – 2020-12-21 (×3): 5 mg via ORAL
  Filled 2020-12-17 (×4): qty 1

## 2020-12-17 MED ORDER — OXYCODONE HCL 5 MG PO TABS
5.0000 mg | ORAL_TABLET | ORAL | Status: DC | PRN
Start: 2020-12-17 — End: 2020-12-21
  Administered 2020-12-17 – 2020-12-21 (×9): 5 mg via ORAL
  Filled 2020-12-17 (×9): qty 1

## 2020-12-17 MED ORDER — ASPIRIN EC 325 MG PO TBEC: 325.0000 mg | DELAYED_RELEASE_TABLET | Freq: Every day | ORAL | 0 refills | Status: DC

## 2020-12-17 MED ORDER — ONDANSETRON HCL 4 MG/2ML IJ SOLN: 4.0000 mg | Freq: Once | INTRAMUSCULAR | Status: DC | PRN

## 2020-12-17 MED ORDER — FAMOTIDINE 20 MG PO TABS
40.0000 mg | ORAL_TABLET | Freq: Every day | ORAL | Status: DC
  Administered 2020-12-18 – 2020-12-21 (×4): 40 mg via ORAL
  Filled 2020-12-17 (×4): qty 2

## 2020-12-17 MED ORDER — MOMETASONE FURO-FORMOTEROL FUM 100-5 MCG/ACT IN AERO
2.0000 | INHALATION_SPRAY | Freq: Two times a day (BID) | RESPIRATORY_TRACT | Status: DC
  Administered 2020-12-17 – 2020-12-21 (×8): 2 via RESPIRATORY_TRACT
  Filled 2020-12-17: qty 8.8

## 2020-12-17 MED ORDER — HYPROMELLOSE (GONIOSCOPIC) 2.5 % OP SOLN
1.0000 [drp] | Freq: Three times a day (TID) | OPHTHALMIC | Status: DC | PRN
  Administered 2020-12-18: 1 [drp] via OPHTHALMIC
  Filled 2020-12-17: qty 15

## 2020-12-17 MED ORDER — POLYETHYLENE GLYCOL 3350 17 G PO PACK: 17.0000 g | PACK | Freq: Every day | ORAL | Status: DC | PRN

## 2020-12-17 MED ORDER — STERILE WATER FOR IRRIGATION IR SOLN
Status: DC | PRN
  Administered 2020-12-17: 1000 mL

## 2020-12-17 MED ORDER — UMECLIDINIUM BROMIDE 62.5 MCG/INH IN AEPB
1.0000 | INHALATION_SPRAY | Freq: Every day | RESPIRATORY_TRACT | Status: DC
  Administered 2020-12-18 – 2020-12-21 (×4): 1 via RESPIRATORY_TRACT
  Filled 2020-12-17: qty 7

## 2020-12-17 MED ORDER — LINACLOTIDE 145 MCG PO CAPS
145.0000 ug | ORAL_CAPSULE | Freq: Every day | ORAL | Status: DC
  Administered 2020-12-18 – 2020-12-21 (×4): 145 ug via ORAL
  Filled 2020-12-17 (×4): qty 1

## 2020-12-17 MED ORDER — PROPOFOL 1000 MG/100ML IV EMUL
INTRAVENOUS | Status: AC
  Filled 2020-12-17: qty 200

## 2020-12-17 MED ORDER — FENTANYL CITRATE (PF) 250 MCG/5ML IJ SOLN
INTRAMUSCULAR | Status: AC
  Filled 2020-12-17: qty 5

## 2020-12-17 MED ORDER — ASPIRIN EC 325 MG PO TBEC
325.0000 mg | DELAYED_RELEASE_TABLET | Freq: Every day | ORAL | Status: DC
  Administered 2020-12-18 – 2020-12-21 (×4): 325 mg via ORAL
  Filled 2020-12-17 (×4): qty 1

## 2020-12-17 MED ORDER — 0.9 % SODIUM CHLORIDE (POUR BTL) OPTIME
TOPICAL | Status: DC | PRN
  Administered 2020-12-17: 1000 mL

## 2020-12-17 MED ORDER — DOCUSATE SODIUM 100 MG PO CAPS
100.0000 mg | ORAL_CAPSULE | Freq: Two times a day (BID) | ORAL | Status: DC
  Administered 2020-12-17 – 2020-12-21 (×9): 100 mg via ORAL
  Filled 2020-12-17 (×9): qty 1

## 2020-12-17 MED ORDER — PROPOFOL 500 MG/50ML IV EMUL
INTRAVENOUS | Status: DC | PRN
  Administered 2020-12-17: 50 ug/kg/min via INTRAVENOUS

## 2020-12-17 MED ORDER — VANCOMYCIN HCL 1000 MG/200ML IV SOLN: 1000.0000 mg | INTRAVENOUS | Status: DC

## 2020-12-17 MED ORDER — BUPIVACAINE LIPOSOME 1.3 % IJ SUSP
20.0000 mL | Freq: Once | INTRAMUSCULAR | Status: AC
  Administered 2020-12-17: 10 mL
  Filled 2020-12-17: qty 20

## 2020-12-17 MED ORDER — VANCOMYCIN HCL IN DEXTROSE 1-5 GM/200ML-% IV SOLN
1000.0000 mg | INTRAVENOUS | Status: AC
  Administered 2020-12-17 (×2): 1000 mg via INTRAVENOUS
  Filled 2020-12-17: qty 200

## 2020-12-17 MED ORDER — SUCCINYLCHOLINE CHLORIDE 200 MG/10ML IV SOSY
PREFILLED_SYRINGE | INTRAVENOUS | Status: AC
  Filled 2020-12-17: qty 10

## 2020-12-17 MED ORDER — FENTANYL CITRATE (PF) 100 MCG/2ML IJ SOLN: 25.0000 ug | INTRAMUSCULAR | Status: DC | PRN

## 2020-12-17 MED ORDER — CARBOXYMETHYLCELLULOSE SODIUM 0.25 % OP SOLN: 1.0000 [drp] | Freq: Three times a day (TID) | OPHTHALMIC | Status: DC | PRN

## 2020-12-17 MED ORDER — MIDAZOLAM HCL 5 MG/5ML IJ SOLN
INTRAMUSCULAR | Status: DC | PRN
  Administered 2020-12-17: 1 mg via INTRAVENOUS

## 2020-12-17 MED ORDER — MONTELUKAST SODIUM 10 MG PO TABS
10.0000 mg | ORAL_TABLET | Freq: Every day | ORAL | Status: DC
  Administered 2020-12-17 – 2020-12-20 (×4): 10 mg via ORAL
  Filled 2020-12-17 (×4): qty 1

## 2020-12-17 MED ORDER — BUPIVACAINE HCL 0.25 % IJ SOLN
INTRAMUSCULAR | Status: DC | PRN
  Administered 2020-12-17: 10 mL

## 2020-12-17 MED ORDER — ONDANSETRON HCL 4 MG PO TABS
4.0000 mg | ORAL_TABLET | Freq: Four times a day (QID) | ORAL | Status: DC | PRN
  Administered 2020-12-17: 4 mg via ORAL
  Filled 2020-12-17: qty 1

## 2020-12-17 MED ORDER — METOCLOPRAMIDE HCL 5 MG/ML IJ SOLN: 5.0000 mg | Freq: Three times a day (TID) | INTRAMUSCULAR | Status: DC | PRN

## 2020-12-17 MED ORDER — BUPIVACAINE HCL (PF) 0.25 % IJ SOLN
INTRAMUSCULAR | Status: AC
  Filled 2020-12-17: qty 10

## 2020-12-17 MED ORDER — ORAL CARE MOUTH RINSE: 15.0000 mL | Freq: Once | OROMUCOSAL | Status: AC

## 2020-12-17 MED ORDER — LORATADINE 10 MG PO TABS
10.0000 mg | ORAL_TABLET | Freq: Every day | ORAL | Status: DC
  Administered 2020-12-17 – 2020-12-21 (×5): 10 mg via ORAL
  Filled 2020-12-17 (×6): qty 1

## 2020-12-17 MED ORDER — PROPOFOL 10 MG/ML IV BOLUS
INTRAVENOUS | Status: AC
  Filled 2020-12-17: qty 20

## 2020-12-17 MED ORDER — LOSARTAN POTASSIUM 50 MG PO TABS
25.0000 mg | ORAL_TABLET | Freq: Every day | ORAL | Status: DC
  Administered 2020-12-18 – 2020-12-21 (×4): 25 mg via ORAL
  Filled 2020-12-17 (×4): qty 1

## 2020-12-17 MED ORDER — METHOCARBAMOL 1000 MG/10ML IJ SOLN
500.0000 mg | Freq: Four times a day (QID) | INTRAVENOUS | Status: DC | PRN
  Filled 2020-12-17: qty 5

## 2020-12-17 MED ORDER — KETAMINE HCL 50 MG/5ML IJ SOSY
PREFILLED_SYRINGE | INTRAMUSCULAR | Status: AC
  Filled 2020-12-17: qty 5

## 2020-12-17 MED ORDER — FLUTICASONE PROPIONATE 50 MCG/ACT NA SUSP
2.0000 | Freq: Every day | NASAL | Status: DC
  Administered 2020-12-17 – 2020-12-20 (×4): 2 via NASAL
  Filled 2020-12-17: qty 16

## 2020-12-17 MED ORDER — LIDOCAINE 2% (20 MG/ML) 5 ML SYRINGE
INTRAMUSCULAR | Status: AC
  Filled 2020-12-17: qty 5

## 2020-12-17 MED ORDER — PHENYLEPHRINE HCL-NACL 10-0.9 MG/250ML-% IV SOLN
INTRAVENOUS | Status: DC | PRN
  Administered 2020-12-17: 40 ug/min via INTRAVENOUS

## 2020-12-17 MED ORDER — ONDANSETRON HCL 4 MG/2ML IJ SOLN: 4.0000 mg | Freq: Four times a day (QID) | INTRAMUSCULAR | Status: DC | PRN

## 2020-12-17 MED ORDER — MIDAZOLAM HCL 2 MG/2ML IJ SOLN
INTRAMUSCULAR | Status: AC
  Filled 2020-12-17: qty 2

## 2020-12-17 MED ORDER — CYCLOSPORINE 0.05 % OP EMUL
1.0000 [drp] | Freq: Two times a day (BID) | OPHTHALMIC | Status: DC
  Administered 2020-12-17 – 2020-12-21 (×7): 1 [drp] via OPHTHALMIC
  Filled 2020-12-17 (×10): qty 1

## 2020-12-17 MED ORDER — METOCLOPRAMIDE HCL 5 MG PO TABS
5.0000 mg | ORAL_TABLET | Freq: Three times a day (TID) | ORAL | Status: DC | PRN
Start: 2020-12-17 — End: 2020-12-21

## 2020-12-17 MED ORDER — LACTATED RINGERS IV SOLN: INTRAVENOUS | Status: DC

## 2020-12-17 MED ORDER — METHOCARBAMOL 500 MG PO TABS: 500.0000 mg | ORAL_TABLET | Freq: Four times a day (QID) | ORAL | 0 refills | Status: DC | PRN

## 2020-12-17 MED ORDER — HYDROMORPHONE HCL 1 MG/ML IJ SOLN
0.5000 mg | INTRAMUSCULAR | Status: DC | PRN
  Administered 2020-12-17 – 2020-12-18 (×4): 0.5 mg via INTRAVENOUS
  Filled 2020-12-17 (×4): qty 0.5

## 2020-12-17 MED ORDER — OXYCODONE-ACETAMINOPHEN 5-325 MG PO TABS: 1.0000 | ORAL_TABLET | Freq: Four times a day (QID) | ORAL | 0 refills | Status: DC | PRN

## 2020-12-17 MED ORDER — SODIUM CHLORIDE 0.9 % IR SOLN
Status: DC | PRN
  Administered 2020-12-17: 3000 mL

## 2020-12-17 MED ORDER — NITROGLYCERIN 2 % TD OINT
0.2500 [in_us] | TOPICAL_OINTMENT | Freq: Three times a day (TID) | TRANSDERMAL | Status: DC
  Administered 2020-12-17 – 2020-12-21 (×12): 0.25 [in_us] via TOPICAL
  Filled 2020-12-17 (×2): qty 30

## 2020-12-17 MED ORDER — BUDESON-GLYCOPYRROL-FORMOTEROL 160-9-4.8 MCG/ACT IN AERO: 2.0000 | INHALATION_SPRAY | Freq: Two times a day (BID) | RESPIRATORY_TRACT | Status: DC

## 2020-12-17 MED ORDER — ACETAMINOPHEN 325 MG PO TABS
325.0000 mg | ORAL_TABLET | Freq: Four times a day (QID) | ORAL | Status: DC | PRN
  Administered 2020-12-18 – 2020-12-21 (×3): 650 mg via ORAL
  Filled 2020-12-17 (×3): qty 2

## 2020-12-17 MED ORDER — METHOCARBAMOL 500 MG PO TABS
500.0000 mg | ORAL_TABLET | Freq: Four times a day (QID) | ORAL | Status: DC | PRN
  Administered 2020-12-17 – 2020-12-18 (×2): 500 mg via ORAL
  Filled 2020-12-17 (×2): qty 1

## 2020-12-17 MED ORDER — SODIUM CHLORIDE 0.9 % IV SOLN: INTRAVENOUS | Status: DC

## 2020-12-17 MED ORDER — CHLORHEXIDINE GLUCONATE 0.12 % MT SOLN
15.0000 mL | Freq: Once | OROMUCOSAL | Status: AC
  Administered 2020-12-17: 15 mL via OROMUCOSAL
  Filled 2020-12-17: qty 15

## 2020-12-17 SURGICAL SUPPLY — 48 items
APL PRP STRL LF DISP 70% ISPRP (MISCELLANEOUS)
BAG SPEC THK2 15X12 ZIP CLS (MISCELLANEOUS)
BAG ZIPLOCK 12X15 (MISCELLANEOUS) ×2 IMPLANT
BLADE SAW SGTL 18X1.27X75 (BLADE) ×2 IMPLANT
BLADE SURG SZ10 CARB STEEL (BLADE) ×3 IMPLANT
CELLS DAT CNTRL 66122 CELL SVR (MISCELLANEOUS) ×1 IMPLANT
CHLORAPREP W/TINT 26 (MISCELLANEOUS) ×1 IMPLANT
COVER LIGHT HANDLE UNIVERSAL (MISCELLANEOUS) ×1 IMPLANT
COVER PERINEAL POST (MISCELLANEOUS) ×2 IMPLANT
COVER SURGICAL LIGHT HANDLE (MISCELLANEOUS) ×2 IMPLANT
COVER WAND RF STERILE (DRAPES) IMPLANT
CUP SECTOR GRIPTON 50MM (Cup) ×1 IMPLANT
DRAPE C-ARM 42X120 X-RAY (DRAPES) ×2 IMPLANT
DRAPE STERI IOBAN 125X83 (DRAPES) ×2 IMPLANT
DRAPE U-SHAPE 47X51 STRL (DRAPES) ×6 IMPLANT
DRSG AQUACEL AG ADV 3.5X14 (GAUZE/BANDAGES/DRESSINGS) ×1 IMPLANT
DRSG MEPILEX BORDER 4X8 (GAUZE/BANDAGES/DRESSINGS) ×2 IMPLANT
ELECT BLADE TIP CTD 4 INCH (ELECTRODE) ×3 IMPLANT
ELECT REM PT RETURN 15FT ADLT (MISCELLANEOUS) ×2 IMPLANT
ELIMINATOR HOLE APEX DEPUY (Hips) ×1 IMPLANT
FACESHIELD WRAPAROUND (MASK) ×8 IMPLANT
FACESHIELD WRAPAROUND OR TEAM (MASK) ×4 IMPLANT
GLOVE ORTHO TXT STRL SZ7.5 (GLOVE) ×2 IMPLANT
GLOVE SRG 8 PF TXTR STRL LF DI (GLOVE) ×2 IMPLANT
GLOVE SURG ENC MOIS LTX SZ7.5 (GLOVE) ×2 IMPLANT
GLOVE SURG LTX SZ7 (GLOVE) ×2 IMPLANT
GLOVE SURG UNDER POLY LF SZ8 (GLOVE) ×4
GOWN STRL REUS W/TWL XL LVL3 (GOWN DISPOSABLE) ×4 IMPLANT
HEAD FEM STD 32X+1 STRL (Hips) ×1 IMPLANT
KIT BASIN OR (CUSTOM PROCEDURE TRAY) ×2 IMPLANT
KIT TURNOVER KIT A (KITS) ×2 IMPLANT
LINER ACET PNNCL PLUS4 NEUTRAL (Hips) IMPLANT
PACK ANTERIOR HIP CUSTOM (KITS) ×2 IMPLANT
PADDING CAST COTTON 6X4 STRL (CAST SUPPLIES) ×2 IMPLANT
PENCIL SMOKE EVACUATOR (MISCELLANEOUS) IMPLANT
PINNACLE PLUS 4 NEUTRAL (Hips) ×2 IMPLANT
RETRACTOR WND ALEXIS 18 MED (MISCELLANEOUS) ×1 IMPLANT
RTRCTR WOUND ALEXIS 18CM MED (MISCELLANEOUS) ×2
STAPLER VISISTAT 35W (STAPLE) IMPLANT
STEM FEM ACTIS STD SZ2 (Stem) ×1 IMPLANT
SUT VIC AB 0 CT1 36 (SUTURE) ×2 IMPLANT
SUT VIC AB 2-0 CT1 27 (SUTURE) ×4
SUT VIC AB 2-0 CT1 TAPERPNT 27 (SUTURE) ×2 IMPLANT
SUT VLOC 180 0 24IN GS25 (SUTURE) ×2 IMPLANT
TOWEL OR 17X26 10 PK STRL BLUE (TOWEL DISPOSABLE) ×4 IMPLANT
TOWEL OR NON WOVEN STRL DISP B (DISPOSABLE) ×2 IMPLANT
TRAY FOLEY MTR SLVR 16FR STAT (SET/KITS/TRAYS/PACK) ×2 IMPLANT
TRAY FOLEY W/BAG SLVR 14FR LF (SET/KITS/TRAYS/PACK) ×1 IMPLANT

## 2020-12-17 NOTE — Anesthesia Procedure Notes (Signed)
Spinal  Patient location during procedure: OR Start time: 12/17/2020 7:35 AM End time: 12/17/2020 7:40 AM Staffing Performed: anesthesiologist  Anesthesiologist: Kipp Brood, MD Spinal Block Patient position: sitting Prep: DuraPrep Patient monitoring: heart rate, cardiac monitor, continuous pulse ox and blood pressure Approach: midline Location: L2-3 Injection technique: single-shot Needle Needle type: Tuohy  Needle gauge: 22 G Assessment Sensory level: T6 Additional Notes 1.6 cc 0.75% injected easily

## 2020-12-17 NOTE — Transfer of Care (Signed)
Immediate Anesthesia Transfer of Care Note  Patient: Desiree [name]  Procedure(s) Performed: TOTAL HIP ARTHROPLASTY-DIRECT ANTERIOR (Right Hip)  Patient Location: PACU  Anesthesia Type:Spinal  Level of Consciousness: awake  Airway & Oxygen Therapy: Patient Spontanous Breathing and Patient connected to face mask oxygen  Post-op Assessment: Report given to RN and Post -op Vital signs reviewed and stable  Post vital signs: Reviewed and stable  Last Vitals:  Vitals Value Taken Time  BP 98/64 12/17/20 0925  Temp 36.6 C 12/17/20 0925  Pulse 88 12/17/20 0930  Resp 19 12/17/20 0933  SpO2 94 % 12/17/20 0930  Vitals shown include unvalidated device data.  Last Pain:  Vitals:   12/17/20 0614  TempSrc:   PainSc: 7       Patients Stated Pain Goal: 2 (12/17/20 7282)  Complications: No complications documented.

## 2020-12-17 NOTE — H&P (Signed)
TOTAL HIP ADMISSION H&P  Patient is admitted for right total hip arthroplasty.  Subjective:  Chief Complaint: right hip pain  HPI: Desiree [name], 70 y.o. female, has a history of pain and functional disability in the right hip(s) due to arthritis and patient has failed non-surgical conservative treatments for greater than 12 weeks to include NSAID's and/or analgesics, use of assistive devices and activity modification.  Onset of symptoms was gradual starting 10 years ago with gradually worsening course since that time.The patient noted no past surgery on the right hip(s).  Patient currently rates pain in the right hip at 10 out of 10 with activity. Patient has night pain, worsening of pain with activity and weight bearing, trendelenberg gait, pain that interfers with activities of daily living and pain with passive range of motion. Patient has evidence of subchondral cysts, subchondral sclerosis, periarticular osteophytes and joint space narrowing by imaging studies. This condition presents safety issues increasing the risk of falls. .  There is no current active infection.  Patient Active Problem List   Diagnosis Date Noted  . Encounter for preoperative assessment 12/10/2020  . Infection of nail bed of finger of right hand 09/15/2020  . Pain in right finger(s) 08/31/2020  . Ganglion of right wrist 08/31/2020  . Unilateral primary osteoarthritis, right hip 04/27/2020  . COPD mixed type (HCC) 12/26/2018  . Healthcare maintenance 12/26/2018  . History of fall 11/05/2018  . Weight loss 11/05/2018  . Weakness 11/05/2018  . Gangrene of finger (HCC) 10/09/2018  . Raynaud's phenomenon with gangrene (HCC) 10/09/2018  . LVH (left ventricular hypertrophy) 10/09/2018  . Vasospasm (HCC) 09/06/2018  . Hypotension 08/08/2018  . Leucocytosis 08/08/2018  . Elevated troponin I level 08/08/2018  . Nausea and vomiting 08/06/2018  . Pneumonia 07/12/2018  . COPD exacerbation (HCC) 07/11/2018  . Primary  osteoarthritis of both feet 12/21/2017  . DDD (degenerative disc disease), lumbar 12/21/2017  . Primary osteoarthritis of both knees 12/21/2017  . History of bilateral carpal tunnel release 12/21/2017  . DDD (degenerative disc disease), cervical 11/15/2017  . Former smoker 11/15/2017  . Bronchiectasis without complication (HCC) 10/25/2017  . Impingement syndrome of right shoulder 07/25/2017  . Hx of fusion of cervical spine 07/25/2017  . Cervical spinal stenosis 09/27/2016  . Polypharmacy 06/23/2015  . Hyperlipidemia 04/19/2015  . Visit for preventive health examination 01/01/2015  . Primary osteoarthritis involving multiple joints 01/01/2015  . Bipolar affective disorder, currently depressed, moderate (HCC)   . Bipolar I disorder with mania (HCC) 08/17/2014  . Hypertension 10/21/2013  . Dizziness 03/25/2013  . Medication withdrawal (HCC) 03/05/2013  . Diabetes mellitus with renal manifestations, controlled (HCC) 11/10/2012  . Alopecia areata 02/06/2012  . Obesity (BMI 30-39.9) 02/06/2012  . Renal insufficiency 07/16/2011  . Neuroleptic-induced tardive dyskinesia   . Exertional dyspnea 02/07/2011  . Dyspnea on exertion 01/19/2011  . DM (diabetes mellitus), type 2 (HCC) 04/22/2010  . Carpal tunnel syndrome 02/02/2010  . CONSTIPATION 02/02/2010  . Primary osteoarthritis of both hands 02/02/2010  . CATARACTS 04/13/2009  . PAIN IN JOINT, ANKLE AND FOOT 09/16/2008  . Eosinophilia 07/21/2008  . AFFECTIVE DISORDER 04/21/2008  . BACK PAIN, CHRONIC 02/12/2008  . LEG PAIN 02/12/2008  . ABNORMAL INVOLUNTARY MOVEMENTS 12/04/2007  . POSTURAL LIGHTHEADEDNESS 11/04/2007  . COLONIC POLYPS, HX OF 11/04/2007  . Essential hypertension 06/17/2007  . HYPERLIPIDEMIA 04/08/2007  . MITRAL VALVE PROLAPSE 04/08/2007  . GERD 04/08/2007  . LOW BACK PAIN SYNDROME 04/08/2007  . CHEST PAIN, RECURRENT 04/08/2007   Past  Medical History:  Diagnosis Date  . Acute gastritis without bleeding 08/08/2018   . Anemia   . Anxiety   . Asthma   . Bipolar disorder (HCC)   . CANDIDIASIS, ESOPHAGEAL 07/28/2009   Qualifier: Diagnosis of  By: Fabian Sharp MD, Neta Mends   . Chronic kidney disease    CKD III  . Complication of anesthesia    was told she stopped breathing for one of her finger surgeries  . COPD (chronic obstructive pulmonary disease) (HCC)   . Depression   . Diabetes mellitus without complication (HCC)    no meds  . Dyspnea    At rest,and with activity  . FH: colonic polyps   . Fractured elbow    right   . GERD (gastroesophageal reflux disease)   . Headache    migraines  . HH (hiatus hernia)   . History of carpal tunnel syndrome   . History of chest pain   . History of transfusion of packed red blood cells   . Hyperlipidemia   . Hypertension   . Mitral valve prolapse   . Neuroleptic-induced tardive dyskinesia   . Osteoarthritis of more than one site   . Pneumonia   . Seasonal allergies   . Stroke [facility]     Past Surgical History:  Procedure Laterality Date  . AMPUTATION Left 10/14/2018   Procedure: AMPUTATION LEFT LONG FINGER TIP;  Surgeon: Knute Neu, MD;  Location: [facility] OR;  Service: Plastics;  Laterality: Left;  . ANTERIOR CERVICAL DECOMP/DISCECTOMY FUSION  09/27/2016   C5-6 anterior cervical discectomy and fusion, allograft and plate/notes 10/29/5100  . ANTERIOR CERVICAL DECOMP/DISCECTOMY FUSION N/A 09/27/2016   Procedure: C5-6 Anterior Cervical Discectomy and Fusion, Allograft and Plate;  Surgeon: Eldred Manges, MD;  Location: [facility] OR;  Service: Orthopedics;  Laterality: N/A;  . Back Fusion  2002  . BIOPSY  07/19/2018   Procedure: BIOPSY;  Surgeon: Jeani Hawking, MD;  Location: [facility] ENDOSCOPY;  Service: Endoscopy;;  . BIOPSY  02/13/2019   Procedure: BIOPSY;  Surgeon: Jeani Hawking, MD;  Location: [facility] ENDOSCOPY;  Service: Endoscopy;;  . CARPAL TUNNEL RELEASE  yates   left  . COLONOSCOPY N/A 01/05/2014   Procedure: COLONOSCOPY;  Surgeon: Charna Elizabeth, MD;  Location: [facility]  ENDOSCOPY;  Service: Endoscopy;  Laterality: N/A;  . ELBOW SURGERY     age 2  . ENTEROSCOPY N/A 02/13/2019   Procedure: ENTEROSCOPY;  Surgeon: Jeani Hawking, MD;  Location: [facility] ENDOSCOPY;  Service: Endoscopy;  Laterality: N/A;  . ESOPHAGOGASTRODUODENOSCOPY (EGD) WITH PROPOFOL N/A 07/19/2018   Procedure: ESOPHAGOGASTRODUODENOSCOPY (EGD) WITH PROPOFOL;  Surgeon: Jeani Hawking, MD;  Location: [facility] ENDOSCOPY;  Service: Endoscopy;  Laterality: N/A;  . EXTERNAL EAR SURGERY Left   . EYE SURGERY     "removed white dots under eyelid"  . FINGER SURGERY Left   . Juvara osteomy    . KNEE SURGERY    . NOSE SURGERY    . Rt. toe bunion    . skin, shave biopsy  05/03/2016   Left occipital scalp, top of scalp  . TONSILLECTOMY    . UPPER EXTREMITY ANGIOGRAPHY Bilateral 10/11/2018   Procedure: UPPER EXTREMITY ANGIOGRAPHY;  Surgeon: Sherren Kerns, MD;  Location: [facility] INVASIVE CV LAB;  Service: Cardiovascular;  Laterality: Bilateral;    Current Facility-Administered Medications  Medication Dose Route Frequency Provider Last Rate Last Admin  . lactated ringers infusion   Intravenous Continuous Hodierne, Adam, MD      . vancomycin (VANCOCIN) IVPB 1000 mg/200 mL premix  1,000 mg Intravenous On Call to OR Naida Sleight, PA-C       Allergies  Allergen Reactions  . Prednisone Shortness Of Breath, Itching, Nausea And Vomiting and Palpitations  . Solu-Medrol [Methylprednisolone] Anaphylaxis  . Amoxicillin Hives and Rash    Has patient had a PCN reaction causing immediate rash, facial/tongue/throat swelling, SOB or lightheadedness with hypotension:Yes Has patient had a PCN reaction causing severe rash involving mucus membranes or skin necrosis: No Has patient had a PCN reaction that required hospitalization: No Has patient had a PCN reaction occurring within the last 10 years: No If all of the above answers are "NO", then may proceed with Cephalosporin use.   . Codeine Hives, Itching and Rash    Tolerated  oxycodone and morphine previously  . Hydrocodone-Acetaminophen Hives, Itching and Rash    Tolerated oxycodone and morphine previously  . Penicillins Hives, Itching and Rash    ALLERGIC REACTION TO ORAL AMOXICILLIN Has patient had a PCN reaction causing immediate rash, facial/tongue/throat swelling, SOB or lightheadedness with hypotension: Yes Has patient had a PCN reaction causing severe rash involving mucus membranes or skin necrosis: No Has patient had a PCN reaction that required hospitalization: No Has patient had a PCN reaction occurring within the last 10 years: No If all of the above answers are "NO", then may proceed with Cephalosporin use.  . Tape Other (See Comments)    sore    Social History   Tobacco Use  . Smoking status: Former Smoker    Packs/day: 2.00    Years: 30.00    Pack years: 60.00    Types: Cigarettes    Quit date: 10/23/2001    Years since quitting: 19.1  . Smokeless tobacco: Never Used  Substance Use Topics  . Alcohol use: No    Family History  Problem Relation Age of Onset  . Heart attack Father   . Heart disease Father   . Throat cancer Brother   . Diabetes Mother   . Hypertension Mother   . Heart attack Mother   . Diabetes type II Brother   . Heart disease Brother   . Lung cancer Brother   . Breast cancer Cousin   . Lung cancer Daughter   . Lung cancer Paternal Uncle      Review of Systems  Constitutional: Positive for activity change.  HENT: Negative.   Respiratory: Negative.   Cardiovascular: Negative.   Genitourinary: Negative.   Musculoskeletal: Positive for gait problem.    Objective:  Physical Exam HENT:     Head: Normocephalic and atraumatic.     Nose: Nose normal.  Cardiovascular:     Rate and Rhythm: Regular rhythm.  Pulmonary:     Breath sounds: Normal breath sounds.  Musculoskeletal:        General: Tenderness present.  Neurological:     General: No focal deficit present.     Mental Status: She is alert.      Vital signs in last 24 hours: Temp:  [97.8 F (36.6 C)] 97.8 F (36.6 C) (02/25 0557) Pulse Rate:  [88] 88 (02/25 0557) Resp:  [18] 18 (02/25 0557) BP: (122)/(66) 122/66 (02/25 0557) SpO2:  [96 %] 96 % (02/25 0557) Weight:  [70.3 kg] 70.3 kg (02/25 0557)  Labs:   Estimated body mass index is 28.35 kg/m as calculated from the following:   Height as of this encounter:  (1.575 m).   Weight as of this encounter: 70.3 kg.   Imaging Review Plain  radiographs demonstrate moderate degenerative joint disease of the right hip(s). The bone quality appears to be good for age and reported activity level.      Assessment/Plan:  End stage arthritis, right hip(s)  The patient history, physical examination, clinical judgement of the provider and imaging studies are consistent with end stage degenerative joint disease of the right hip(s) and total hip arthroplasty is deemed medically necessary. The treatment options including medical management, injection therapy, arthroscopy and arthroplasty were discussed at length. The risks and benefits of total hip arthroplasty were presented and reviewed. The risks due to aseptic loosening, infection, stiffness, dislocation/subluxation,  thromboembolic complications and other imponderables were discussed.  The patient acknowledged the explanation, agreed to proceed with the plan and consent was signed. Patient is being admitted for inpatient treatment for surgery, pain control, PT, OT, prophylactic antibiotics, VTE prophylaxis, progressive ambulation and ADL's and discharge planning.The patient is planning to be discharged home with home health services

## 2020-12-17 NOTE — Evaluation (Signed)
Physical Therapy Evaluation Patient Details Name: Desiree [name] MRN: [id number] DOB: [birth year] Today's Date: 12/17/2020   History of Present Illness  Pt is a 70 y/o female s/p R THA, direct anterior. PMH includes CVA, s/p ACDF, Bipolar disorder, CKD, COPD, and DM.  Clinical Impression  Pt is s/p surgery above with deficits below. Pt with increased pain and nausea so mobility limited this session. Required mod A for bed mobility tasks. Anticipate pt will progress well once pain controlled. Will continue to follow acutely.     Follow Up Recommendations Follow surgeon's recommendation for DC plan and follow-up therapies    Equipment Recommendations  Rolling walker with 5" wheels;3in1 (PT)    Recommendations for Other Services OT consult     Precautions / Restrictions Precautions Precautions: Fall Restrictions Weight Bearing Restrictions: Yes RLE Weight Bearing: Weight bearing as tolerated      Mobility  Bed Mobility Overal bed mobility: Needs Assistance Bed Mobility: Sit to Supine;Sit to Sidelying       Sit to supine: Mod assist Sit to sidelying: Mod assist General bed mobility comments: Mod A for BLE assist and scooting hips to EOB. Once sitting, pt continued to report 10/10 pain and with increased nausea so further mobility deferred. Mod A for LE assist for return to supine.    Transfers                    Ambulation/Gait                Stairs            Wheelchair Mobility    Modified Rankin (Stroke Patients Only)       Balance                                             Pertinent Vitals/Pain Pain Assessment: 0-10 Pain Score: 10-Worst pain ever Pain Location: R hip Pain Descriptors / Indicators: Aching;Operative site guarding Pain Intervention(s): Limited activity within patient's tolerance;Monitored during session;Repositioned    Home Living Family/patient expects to be discharged to:: Private residence Living  Arrangements: Alone Available Help at Discharge: Friend(s);Available 24 hours/day Type of Home: Apartment Home Access: Level entry     Home Layout: One level Home Equipment: Cane - single point      Prior Function Level of Independence: Independent with assistive device(s)         Comments: Uses cane for ambulation     Hand Dominance        Extremity/Trunk Assessment   Upper Extremity Assessment Upper Extremity Assessment: Defer to OT evaluation    Lower Extremity Assessment Lower Extremity Assessment: RLE deficits/detail RLE Deficits / Details: Deficits consistent with post op pain and weakness. Pt with limited ROM secondary to pain.    Cervical / Trunk Assessment Cervical / Trunk Assessment: Normal  Communication   Communication: No difficulties  Cognition Arousal/Alertness: Awake/alert Behavior During Therapy: WFL for tasks assessed/performed Overall Cognitive Status: Within Functional Limits for tasks assessed                                        General Comments      Exercises     Assessment/Plan    PT Assessment Patient needs continued PT services  PT Problem List  Decreased strength;Decreased balance;Decreased activity tolerance;Decreased mobility;Decreased knowledge of use of DME;Decreased knowledge of precautions       PT Treatment Interventions Gait training;DME instruction;Stair training;Functional mobility training;Therapeutic activities;Therapeutic exercise;Balance training;Patient/family education    PT Goals (Current goals can be found in the Care Plan section)  Acute Rehab PT Goals Patient Stated Goal: to decrease pain PT Goal Formulation: With patient Time For Goal Achievement: 12/31/20 Potential to Achieve Goals: Fair    Frequency 7X/week   Barriers to discharge        Co-evaluation               AM-PAC PT "6 Clicks" Mobility  Outcome Measure Help needed turning from your back to your side while in a  flat bed without using bedrails?: A Little Help needed moving from lying on your back to sitting on the side of a flat bed without using bedrails?: A Lot Help needed moving to and from a bed to a chair (including a wheelchair)?: A Lot Help needed standing up from a chair using your arms (e.g., wheelchair or bedside chair)?: A Lot Help needed to walk in hospital room?: A Lot Help needed climbing 3-5 steps with a railing? : A Lot 6 Click Score: 13    End of Session   Activity Tolerance: Patient limited by pain;Treatment limited secondary to medical complications (Comment) (nausea) Patient left: in bed;with call bell/phone within reach;with bed alarm set Nurse Communication: Mobility status;Other (comment) (pt requests nausea meds) PT Visit Diagnosis: Other abnormalities of gait and mobility (R26.89);Pain Pain - Right/Left: Right Pain - part of body: Hip    Time: 1443-1500 PT Time Calculation (min) (ACUTE ONLY): 17 min   Charges:   PT Evaluation $PT Eval Low Complexity: 1 Low          Cindee Salt, DPT  Acute Rehabilitation Services  Pager: [phone] Office: [phone]   Lehman Prom 12/17/2020, 4:21 PM

## 2020-12-17 NOTE — Anesthesia Postprocedure Evaluation (Signed)
Anesthesia Post Note  Patient: Desiree [name]  Procedure(s) Performed: TOTAL HIP ARTHROPLASTY-DIRECT ANTERIOR (Right Hip)     Patient location during evaluation: PACU Anesthesia Type: General Level of consciousness: oriented and awake and alert Pain management: pain level controlled Vital Signs Assessment: post-procedure vital signs reviewed and stable Respiratory status: spontaneous breathing, respiratory function stable and patient connected to nasal cannula oxygen Cardiovascular status: blood pressure returned to baseline and stable Postop Assessment: no headache, no backache and no apparent nausea or vomiting Anesthetic complications: no   No complications documented.  Last Vitals:  Vitals:   12/17/20 1245 12/17/20 1300  BP:  (!) 133/92  Pulse:  100  Resp:  16  Temp:  36.7 C  SpO2: 94% 94%    Last Pain:  Vitals:   12/17/20 1300  TempSrc: Oral  PainSc:                  Stratton Villwock COKER

## 2020-12-17 NOTE — Op Note (Signed)
Preop diagnosis: Right hip primary osteoarthritis  Postop diagnosis: Same  Procedure: Right total hip arthroplasty.  Direct anterior approach  Surgeon: Annell Greening, MD  Assistant Zonia Kief, PA-C medically necessary and present with entire procedure  Anesthesia spinal plus Exparel and Marcaine 10 with 10 equals 20.  EBL less than 200 cc.  ImplantsImplants   CUP SECTOR GRIPTON - [id number]  Inventory Item: CUP SECTOR GRIPTON Serial no.:  Model/Cat no.: [id number]  Implant name: CUP SECTOR GRIPTON - [id number] Laterality: Right Area: Hip  Manufacturer: [employer] Date of Manufacture:    Action: Implanted Number Used: 1   Device Identifier:  Device Identifier TypeWaylan Rocher APEX DEPUY - [id number]  Inventory Item: ELIMINATOR HOLE APEX DEPUY Serial no.:  Model/Cat no.: [id number]  Implant nameWaylan Rocher APEX DEPUY - [id number] Laterality: Right Area: Hip  Manufacturer: [employer] Date of Manufacture:    Action: Implanted Number Used: 1   Device Identifier:  Device Identifier Type:     STEM FEM ACTIS STD SZ2 - [id number]  Inventory Item: STEM FEM ACTIS STD SZ2 Serial no.:  Model/Cat no.: [id number]  Implant name: STEM FEM ACTIS STD SZ2 - [id number] Laterality: Right Area: Hip  Manufacturer: [employer] Date of Manufacture:    Action: Implanted Number Used: 1   Device Identifier:  Device Identifier Type:     HIP BALL ARTICU DEPUY - [id number]  Inventory Item: HIP BALL ARTICU DEPUY Serial no.:  Model/Cat no.: [id number]  Implant name: HIP BALL ARTICU DEPUY - [id number] Laterality: Right Area: Hip  Manufacturer: [employer] Date of Manufacture:    Action: Implanted Number Used: 1   Device Identifier:  Device Identifier Type:     PINNACLE PLUS 4 NEUTRAL - [id number]  Inventory Item: PINNACLE PLUS 4 NEUTRAL Serial no.:  Model/Cat no.: [id number]  Implant name: PINNACLE PLUS 4 NEUTRAL - [id number] Laterality: Right Area: Hip   Manufacturer: [employer] Date of Manufacture:    Action: Implanted Number Used: 1   Device Identifier:  Device Identifier Type:       Procedure: After induction of spinal anesthesia Foley catheter placement Hana boots patient placed on the Hana table C-arm was brought in 1015 drapes were placed above and below hip was prepped with DuraPrep visualization of both hips.  Patient had primary right hip osteoarthritis.  Large shower curtain Steri-Drape applied after new blue you drapes were applied.  Sterile skin marker Betadine Steri-Drape anterior prostate above.  Timeout procedure was completed Ancef was given prophylactically.  Direct anterior incision was made 1 fingerbreadth lateral one inferior to the ASIS over to the trochanter obliquely.  Subtenons tissue cut with the Bovie fascia identified nicked extended and Cobra was placed and fully over top the capsule transverse bleeders were identified and the fact coagulated divided some fat was removed anterior capsule was opened and gold probe was placed inside.  Anterior capsule was resected neck was cut under C-arm visualization head removed with a corkscrew.  Sequential reaming up to 48.  50 cheese grater reamer hit the floor and 50 TechStep the outer rim and in the 50 cup was placed under C-arm visualization with excellent tight fit good flexion and abduction under C-arm visualization.  Hole centralizer was placed no screw was needed.  Preparation the femur was performed a hydraulic Cook leg was turned to external rotation 120 taken down and under posterior capsule was divided.  Cookie cutter rondure, curettes for lateralization for gradual progression up to  a #2 size.  #2 size gave extremely tight fit we could not progressed to #3 and reduction was performed good stability external rotation and 90 in the hip to be taken halfway down and then almost to the floor and still was reduced there is trace shaft leg lengths are equal by C arm  measurements.  Permanent stem was inserted metal ball +1.5 was tapped on was secure with a clean trunnion.  Hip was reduced identical findings of good stability copious irrigation.  Operative field was dry standard V-Loc closure of the fascia ~subtenons tissue skin staple closure postop dressing and transferred recovery room.  Patient tolerated procedure well.  Marcaine was injected prior to closure templates clinic was 20 cc for postoperative analgesia.

## 2020-12-17 NOTE — Care Plan (Signed)
Ortho Bundle Case Management Note  Patient Details  Name: Desiree [name] MRN: [id number] Date of Birth: Aug 06, 1951    [facility] met with patient in office during her H&P appointment with Benjiman Core, PA-C for Dr. Lorin Mercy. Also spoke with patient via telephone. Patient is an Ortho bundle patient through [facility] and is agreeable to case management. Answered questions related to surgery and hospitalization as well as post-op care instructions. She verbalized she has a friend that will be assisting her at home after surgery. She will need a FWW as well as 3in1/BSC. These will be ordered through [employer] to be delivered to her hospital room prior to discharge. Will order HHPT only if MD feels she needs after surgery. Will continue to follow for needs.          DME Arranged:  3-N-1,Walker rolling DME Agency:  [facility]  HH Arranged:    HH Agency:     Additional Comments: Please contact me with any questions of if this plan should need to change.  Jamse Arn, RN, BSN, [employer]  [phone] 12/17/2020, 2:40 PM

## 2020-12-17 NOTE — Interval H&P Note (Signed)
History and Physical Interval Note:  12/17/2020 7:23 AM  Desiree [name]  has presented today for surgery, with the diagnosis of RIGHT HIP OSTEOARTHRITIS.  The various methods of treatment have been discussed with the patient and family. After consideration of risks, benefits and other options for treatment, the patient has consented to  Procedure(s) with comments: TOTAL HIP ARTHROPLASTY-DIRECT ANTERIOR (Right) - needs RNFA as a surgical intervention.  The patient's history has been reviewed, patient examined, no change in status, stable for surgery.  I have reviewed the patient's chart and labs.  Questions were answered to the patient's satisfaction.     Eldred Manges

## 2020-12-17 NOTE — Discharge Instructions (Signed)
INSTRUCTIONS AFTER JOINT REPLACEMENT   o Remove items at home which could result in a fall. This includes throw rugs or furniture in walking pathways o ICE to the affected joint every three hours while awake for 30 minutes at a time, for at least the first 3-5 days, and then as needed for pain and swelling.  Continue to use ice for pain and swelling. You may notice swelling that will progress down to the foot and ankle.  This is normal after surgery.  Elevate your leg when you are not up walking on it.   o Continue to use the breathing machine you got in the hospital (incentive spirometer) which will help keep your temperature down.  It is common for your temperature to cycle up and down following surgery, especially at night when you are not up moving around and exerting yourself.  The breathing machine keeps your lungs expanded and your temperature down.   DIET:  As you were doing prior to hospitalization, we recommend a well-balanced diet.  DRESSING / WOUND CARE / SHOWERING  You may change your dressing 3-5 days after surgery.  Then change the dressing every day with sterile gauze.  Please use good hand washing techniques before changing the dressing.  Do not use any lotions or creams on the incision until instructed by your surgeon.  ACTIVITY  o Increase activity slowly as tolerated, but follow the weight bearing instructions below.   o No driving for 6 weeks or until further direction given by your physician.  You cannot drive while taking narcotics.  o No lifting or carrying greater than 10 lbs. until further directed by your surgeon. o Avoid periods of inactivity such as sitting longer than an hour when not asleep. This helps prevent blood clots.  o You may return to work once you are authorized by your doctor.     WEIGHT BEARING   Weight bearing as tolerated with assist device (walker, cane, etc) as directed, use it as long as suggested by your surgeon or therapist, typically at  least 4-6 weeks.   EXERCISES  Results after joint replacement surgery are often greatly improved when you follow the exercise, range of motion and muscle strengthening exercises prescribed by your doctor. Safety measures are also important to protect the joint from further injury. Any time any of these exercises cause you to have increased pain or swelling, decrease what you are doing until you are comfortable again and then slowly increase them. If you have problems or questions, call your caregiver or physical therapist for advice.   Rehabilitation is important following a joint replacement. After just a few days of immobilization, the muscles of the leg can become weakened and shrink (atrophy).  These exercises are designed to build up the tone and strength of the thigh and leg muscles and to improve motion. Often times heat used for twenty to thirty minutes before working out will loosen up your tissues and help with improving the range of motion but do not use heat for the first two weeks following surgery (sometimes heat can increase post-operative swelling).   These exercises can be done on a training (exercise) mat, on the floor, on a table or on a bed. Use whatever works the best and is most comfortable for you.    Use music or television while you are exercising so that the exercises are a pleasant break in your day. This will make your life better with the exercises acting as a break   in your routine that you can look forward to.   Perform all exercises about fifteen times, three times per day or as directed.  You should exercise both the operative leg and the other leg as well.  Exercises include:   As instructed by physical therapist  A rehabilitation program following joint replacement surgery can speed recovery and prevent re-injury in the future due to weakened muscles. Contact your doctor or a physical therapist for more information on knee rehabilitation.     CONSTIPATION  Constipation is defined medically as fewer than three stools per week and severe constipation as less than one stool per week.  Even if you have a regular bowel pattern at home, your normal regimen is likely to be disrupted due to multiple reasons following surgery.  Combination of anesthesia, postoperative narcotics, change in appetite and fluid intake all can affect your bowels.   YOU MUST use at least one of the following options; they are listed in order of increasing strength to get the job done.  They are all available over the counter, and you may need to use some, POSSIBLY even all of these options:    Drink plenty of fluids (prune juice may be helpful) and high fiber foods Colace 100 mg by mouth twice a day  Senokot for constipation as directed and as needed Dulcolax (bisacodyl), take with full glass of water  Miralax (polyethylene glycol) once or twice a day as needed.  If you have tried all these things and are unable to have a bowel movement in the first 3-4 days after surgery call either your surgeon or your primary doctor.    If you experience loose stools or diarrhea, hold the medications until you stool forms back up.  If your symptoms do not get better within 1 week or if they get worse, check with your doctor.  If you experience "the worst abdominal pain ever" or develop nausea or vomiting, please contact the office immediately for further recommendations for treatment.   ITCHING:  If you experience itching with your medications, try taking only a single pain pill, or even half a pain pill at a time.  You can also use Benadryl over the counter for itching or also to help with sleep.   TED HOSE STOCKINGS:  Use stockings on both legs until for at least 2 weeks or as directed by physician office. They may be removed at night for sleeping.  MEDICATIONS:  See your medication summary on the "After Visit Summary" that nursing will review with you.  You may have some  home medications which will be placed on hold until you complete the course of blood thinner medication.  It is important for you to complete the blood thinner medication as prescribed.  PRECAUTIONS:  If you experience chest pain or shortness of breath - call 911 immediately for transfer to the hospital emergency department.   If you develop a fever greater that 101 F, purulent drainage from wound, increased redness or drainage from wound, foul odor from the wound/dressing, or calf pain - CONTACT YOUR SURGEON.                                                   FOLLOW-UP APPOINTMENTS:  If you do not already have a post-op appointment, please call the office for an appointment to be  seen by your surgeon.  Guidelines for how soon to be seen are listed in your "After Visit Summary", but are typically between 1-4 weeks after surgery.  OTHER INSTRUCTIONS:   Knee Replacement:  Do not place pillow under knee, focus on keeping the knee straight while resting. CPM instructions: 0-90 degrees, 2 hours in the morning, 2 hours in the afternoon, and 2 hours in the evening. Place foam block, curve side up under heel at all times except when in CPM or when walking.  DO NOT modify, tear, cut, or change the foam block in any way.   DENTAL ANTIBIOTICS:  In most cases prophylactic antibiotics for Dental procdeures after total joint surgery are not necessary.  Exceptions are as follows:  1. History of prior total joint infection  2. Severely immunocompromised (Organ Transplant, cancer chemotherapy, Rheumatoid biologic meds such as Humera)  3. Poorly controlled diabetes (A1C &gt; 8.0, blood glucose over 200)  If you have one of these conditions, contact your surgeon for an antibiotic prescription, prior to your dental procedure.   MAKE SURE YOU:  . Understand these instructions.  . Get help right away if you are not doing well or get worse.    Thank you for letting us be a part of your medical care team.   It is a privilege we respect greatly.  We hope these instructions will help you stay on track for a fast and full recovery!

## 2020-12-18 DIAGNOSIS — Z8673 Personal history of transient ischemic attack (TIA), and cerebral infarction without residual deficits: Secondary | ICD-10-CM | POA: Diagnosis not present

## 2020-12-18 DIAGNOSIS — J449 Chronic obstructive pulmonary disease, unspecified: Secondary | ICD-10-CM | POA: Diagnosis not present

## 2020-12-18 DIAGNOSIS — Z981 Arthrodesis status: Secondary | ICD-10-CM | POA: Diagnosis not present

## 2020-12-18 DIAGNOSIS — Z96641 Presence of right artificial hip joint: Secondary | ICD-10-CM

## 2020-12-18 DIAGNOSIS — Z96649 Presence of unspecified artificial hip joint: Secondary | ICD-10-CM

## 2020-12-18 DIAGNOSIS — Z79899 Other long term (current) drug therapy: Secondary | ICD-10-CM | POA: Diagnosis not present

## 2020-12-18 DIAGNOSIS — E1122 Type 2 diabetes mellitus with diabetic chronic kidney disease: Secondary | ICD-10-CM | POA: Diagnosis not present

## 2020-12-18 DIAGNOSIS — Z87891 Personal history of nicotine dependence: Secondary | ICD-10-CM | POA: Diagnosis not present

## 2020-12-18 DIAGNOSIS — K219 Gastro-esophageal reflux disease without esophagitis: Secondary | ICD-10-CM | POA: Diagnosis not present

## 2020-12-18 DIAGNOSIS — M1611 Unilateral primary osteoarthritis, right hip: Secondary | ICD-10-CM | POA: Diagnosis not present

## 2020-12-18 DIAGNOSIS — E785 Hyperlipidemia, unspecified: Secondary | ICD-10-CM | POA: Diagnosis not present

## 2020-12-18 DIAGNOSIS — I129 Hypertensive chronic kidney disease with stage 1 through stage 4 chronic kidney disease, or unspecified chronic kidney disease: Secondary | ICD-10-CM | POA: Diagnosis not present

## 2020-12-18 DIAGNOSIS — I10 Essential (primary) hypertension: Secondary | ICD-10-CM | POA: Diagnosis not present

## 2020-12-18 DIAGNOSIS — N183 Chronic kidney disease, stage 3 unspecified: Secondary | ICD-10-CM | POA: Diagnosis not present

## 2020-12-18 LAB — BASIC METABOLIC PANEL
Anion gap: 7 (ref 5–15)
BUN: 19 mg/dL (ref 8–23)
CO2: 26 mmol/L (ref 22–32)
Calcium: 8.4 mg/dL — ABNORMAL LOW (ref 8.9–10.3)
Chloride: 101 mmol/L (ref 98–111)
Creatinine, Ser: 1.6 mg/dL — ABNORMAL HIGH (ref 0.44–1.00)
GFR, Estimated: 35 mL/min — ABNORMAL LOW (ref >60)
Glucose, Bld: 115 mg/dL — ABNORMAL HIGH (ref 70–99)
Potassium: 4.2 mmol/L (ref 3.5–5.1)
Sodium: 134 mmol/L — ABNORMAL LOW (ref 135–145)

## 2020-12-18 LAB — CBC
HCT: 32.1 % — ABNORMAL LOW (ref 36.0–46.0)
Hemoglobin: 10.7 g/dL — ABNORMAL LOW (ref 12.0–15.0)
MCH: 30.7 pg (ref 26.0–34.0)
MCHC: 33.3 g/dL (ref 30.0–36.0)
MCV: 92 fL (ref 80.0–100.0)
Platelets: 224 10*3/uL (ref 150–400)
RBC: 3.49 MIL/uL — ABNORMAL LOW (ref 3.87–5.11)
RDW: 14 % (ref 11.5–15.5)
WBC: 7.5 10*3/uL (ref 4.0–10.5)
nRBC: 0 % (ref 0.0–0.2)

## 2020-12-18 LAB — GLUCOSE, CAPILLARY
Glucose-Capillary: 116 mg/dL — ABNORMAL HIGH (ref 70–99)
Glucose-Capillary: 110 mg/dL — ABNORMAL HIGH (ref 70–99)
Glucose-Capillary: 91 mg/dL (ref 70–99)
Glucose-Capillary: 92 mg/dL (ref 70–99)

## 2020-12-18 NOTE — Plan of Care (Signed)

## 2020-12-18 NOTE — Plan of Care (Signed)
  Problem: Education: Goal: Knowledge of General Education information will improve Description Including pain rating scale, medication(s)/side effects and non-pharmacologic comfort measures Outcome: Progressing   Problem: Nutrition: Goal: Adequate nutrition will be maintained Outcome: Progressing   Problem: Pain Managment: Goal: General experience of comfort will improve Outcome: Progressing   

## 2020-12-18 NOTE — Progress Notes (Signed)
   Subjective: 1 Day Post-Op Procedure(s) (LRB): TOTAL HIP ARTHROPLASTY-DIRECT ANTERIOR (Right) Patient reports pain as moderate and severe.    Objective: Vital signs in last 24 hours: Temp:  [97.3 F (36.3 C)-100.3 F (37.9 C)] 99.9 F (37.7 C) (02/26 0756) Pulse Rate:  [79-107] 101 (02/26 0816) Resp:  [10-20] 16 (02/26 0816) BP: (98-139)/(50-92) 113/62 (02/26 0756) SpO2:  [90 %-100 %] 93 % (02/26 0816)  Intake/Output from previous day: 02/25 0701 - 02/26 0700 In: 2615.9 [P.O.:240; I.V.:2375.9] Out: 2250 [Urine:2050; Blood:200] Intake/Output this shift: No intake/output data recorded.  Recent Labs    12/18/20 0129  HGB 10.7*   Recent Labs    12/18/20 0129  WBC 7.5  RBC 3.49*  HCT 32.1*  PLT 224   Recent Labs    12/18/20 0129  NA 134*  K 4.2  CL 101  CO2 26  BUN 19  CREATININE 1.60*  GLUCOSE 115*  CALCIUM 8.4*   No results for input(s): LABPT, INR in the last 72 hours.  Neurologically intact DG Hip Port Unilat With Pelvis 1V Right  Result Date: 12/17/2020 CLINICAL DATA:  Status post right hip arthroplasty. EXAM: DG HIP (WITH OR WITHOUT PELVIS) 1V PORT RIGHT COMPARISON:  12/17/2020, earlier today. FINDINGS: Interval right total hip arthroplasty. The hardware components are in anatomic alignment. No periprosthetic fracture or subluxation. Skin staples and soft tissue gas overlies the proximal right femur and hip. IMPRESSION: Status post right total hip arthroplasty.  No complications. Electronically Signed   By: Signa Kell M.D.   On: 12/17/2020 10:51    Assessment/Plan: 1 Day Post-Op Procedure(s) (LRB): TOTAL HIP ARTHROPLASTY-DIRECT ANTERIOR (Right) Up with therapy, only made it to recliner. Not likely going home today.  Eldred Manges 12/18/2020, 9:14 AM

## 2020-12-18 NOTE — TOC Initial Note (Signed)
Transition of Care [facility]) - Initial/Assessment Note    Patient Details  Name: Desiree [name] MRN: [id number] Date of Birth: [birth year]  Transition of Care [facility]) CM/SW Contact:    Desiree [name], [place] Phone Number:[phone] 12/18/2020, 11:57 AM  Clinical Narrative:                 Patient is a 70 year old female, status post right hip arthroplasty. Patient resides alone in her own apartment.  Patient's primary is Dr. Fabian [name] through [employer]. Patient uses Development worker, community and [facility] to fill her prescriptions. Per patient, she  receives no in home services at this time. Patient evaluated by PT, SNF recommended. SNF recommendation discussed with patient, process explained. Patient reluctant initially when discussing SNF recommendation, however has agreed. Patient has no preference but would like to remain local. PASRR to be completed, as well as Fl2 to begin bed search.   Transition of Care to continue to follow.  Desiree Petite, LCSW Transition of Care [phone]   Expected Discharge Plan: Skilled Nursing Facility Barriers to Discharge: Continued Medical Work up   Patient Goals and CMS Choice Patient states their goals for this hospitalization and ongoing recovery are:: "I want to get up and move around" CMS Medicare.gov Compare Post Acute Care list provided to:: Patient Choice offered to / list presented to : Patient  Expected Discharge Plan and Services Expected Discharge Plan: Skilled Nursing Facility       Living arrangements for the past 2 months: Apartment                 DME Arranged: 3-N-1,Walker rolling DME Agency: [employer]                  Prior Living Arrangements/Services Living arrangements for the past 2 months: Apartment Lives with:: Self   Do you feel safe going back to the place where you live?: Yes      Need for Family Participation in Patient Care: No (Comment) Care giver support system in place?: Yes (comment)    Criminal Activity/Legal Involvement Pertinent to Current Situation/Hospitalization: No - Comment as needed  Activities of Daily Living Home Assistive Devices/Equipment: Cane (specify quad or straight),Eyeglasses ADL Screening (condition at time of admission) Patient's cognitive ability adequate to safely complete daily activities?: Yes Is the patient deaf or have difficulty hearing?: No Does the patient have difficulty seeing, even when wearing glasses/contacts?: No Does the patient have difficulty concentrating, remembering, or making decisions?: No Patient able to express need for assistance with ADLs?: Yes Does the patient have difficulty dressing or bathing?: No Independently performs ADLs?: Yes (appropriate for developmental age) Does the patient have difficulty walking or climbing stairs?: Yes Weakness of Legs: Right Weakness of Arms/Hands: None  Permission Sought/Granted                  Emotional Assessment Appearance:: Appears older than stated age Attitude/Demeanor/Rapport: Apprehensive Affect (typically observed): Accepting,Adaptable Orientation: : Oriented to Self,Oriented to Place,Oriented to  Time,Oriented to Situation Alcohol / Substance Use: Not Applicable Psych Involvement: No (comment)  Admission diagnosis:  Arthritis of right hip [M16.11] H/O total hip arthroplasty [Z96.649] Patient Active Problem List   Diagnosis Date Noted  . H/O total hip arthroplasty 12/18/2020  . Arthritis of right hip 12/17/2020  . Encounter for preoperative assessment 12/10/2020  . Infection of nail bed of finger of right hand 09/15/2020  . Pain in right finger(s) 08/31/2020  . Ganglion of right wrist 08/31/2020  . Unilateral  primary osteoarthritis, right hip 04/27/2020  . COPD mixed type (HCC) 12/26/2018  . Healthcare maintenance 12/26/2018  . History of fall 11/05/2018  . Weight loss 11/05/2018  . Weakness 11/05/2018  . Gangrene of finger (HCC) 10/09/2018  . Raynaud's  phenomenon with gangrene (HCC) 10/09/2018  . LVH (left ventricular hypertrophy) 10/09/2018  . Vasospasm (HCC) 09/06/2018  . Hypotension 08/08/2018  . Leucocytosis 08/08/2018  . Elevated troponin I level 08/08/2018  . Nausea and vomiting 08/06/2018  . Pneumonia 07/12/2018  . COPD exacerbation (HCC) 07/11/2018  . Primary osteoarthritis of both feet 12/21/2017  . DDD (degenerative disc disease), lumbar 12/21/2017  . Primary osteoarthritis of both knees 12/21/2017  . History of bilateral carpal tunnel release 12/21/2017  . DDD (degenerative disc disease), cervical 11/15/2017  . Former smoker 11/15/2017  . Bronchiectasis without complication (HCC) 10/25/2017  . Impingement syndrome of right shoulder 07/25/2017  . Hx of fusion of cervical spine 07/25/2017  . Cervical spinal stenosis 09/27/2016  . Polypharmacy 06/23/2015  . Hyperlipidemia 04/19/2015  . Visit for preventive health examination 01/01/2015  . Primary osteoarthritis involving multiple joints 01/01/2015  . Bipolar affective disorder, currently depressed, moderate (HCC)   . Bipolar I disorder with mania (HCC) 08/17/2014  . Hypertension 10/21/2013  . Dizziness 03/25/2013  . Medication withdrawal (HCC) 03/05/2013  . Diabetes mellitus with renal manifestations, controlled (HCC) 11/10/2012  . Alopecia areata 02/06/2012  . Obesity (BMI 30-39.9) 02/06/2012  . Renal insufficiency 07/16/2011  . Neuroleptic-induced tardive dyskinesia   . Exertional dyspnea 02/07/2011  . Dyspnea on exertion 01/19/2011  . DM (diabetes mellitus), type 2 (HCC) 04/22/2010  . Carpal tunnel syndrome 02/02/2010  . CONSTIPATION 02/02/2010  . Primary osteoarthritis of both hands 02/02/2010  . CATARACTS 04/13/2009  . PAIN IN JOINT, ANKLE AND FOOT 09/16/2008  . Eosinophilia 07/21/2008  . AFFECTIVE DISORDER 04/21/2008  . BACK PAIN, CHRONIC 02/12/2008  . LEG PAIN 02/12/2008  . ABNORMAL INVOLUNTARY MOVEMENTS 12/04/2007  . POSTURAL LIGHTHEADEDNESS 11/04/2007   . COLONIC POLYPS, HX OF 11/04/2007  . Essential hypertension 06/17/2007  . HYPERLIPIDEMIA 04/08/2007  . MITRAL VALVE PROLAPSE 04/08/2007  . GERD 04/08/2007  . LOW BACK PAIN SYNDROME 04/08/2007  . CHEST PAIN, RECURRENT 04/08/2007   PCP:  Madelin Headings, MD Pharmacy:   [facility] ORDER) ELECTRONIC - [place], [place] - [address] [place] [address] Phone: [phone] Fax: [phone]  [facility] - [place], [place] - [address] [place] [address] Phone: [phone] Fax: [phone]  [id number] - [place], [place] - [address] AT [facility] [address] [facility] [address] [place] [address] Phone: [phone] Fax: [phone]     Social Determinants of Health (SDOH) Interventions    Readmission Risk Interventions No flowsheet data found.

## 2020-12-18 NOTE — Evaluation (Signed)
Occupational Therapy Evaluation Patient Details Name: Desiree [name] MRN: [id number] DOB: 1951-01-31 Today's Date: 12/18/2020    History of Present Illness Pt is a 70 y/o female s/p R THA, direct anterior. PMH includes CVA, s/p ACDF, Bipolar disorder, CKD, COPD, and DM.   Clinical Impression   Patient admitted for the above diagnosis and procedure.  Pain to her right hip with Adduction and weight bearing is the limiting factor to independence.  Currently she is needing up to Mod A for basic mobility and lower body ADL.  Hip kit was taken in, and the patient practiced with [facility] reacher and the sock aid.  SNF has been recommended; the patient would prefer to go home with assist from a friend.  If the friend can provide ST 24 hour moderate assist, then home with [facility] is possible.  She should progress quickly once her pain subsides, perhaps the MD would consider a few more hospital days to ensure a safe discharge home with [facility].      Follow Up Recommendations  Home health OT versus SNF: patient currently needs 24 hour moderate care due to pain.  If pain lessen, home with [facility] is an option.      Equipment Recommendations  3 in 1 bedside commode;Tub/shower bench    Recommendations for Other Services       Precautions / Restrictions Precautions Precautions: Fall Restrictions Weight Bearing Restrictions: Yes RLE Weight Bearing: Weight bearing as tolerated Other Position/Activity Restrictions: Painful R hip      Mobility Bed Mobility Overal bed mobility: Needs Assistance Bed Mobility: Supine to Sit     Supine to sit: Mod assist          Transfers Overall transfer level: Needs assistance Equipment used: Rolling walker (2 wheeled) Transfers: Sit to/from [employer] Sit to Stand: Mod assist Stand pivot transfers: Mod assist       General transfer comment: assist to boost and assist under R arm to offload some weight during steps.    Balance Overall balance assessment:  Needs assistance Sitting-balance support: Bilateral upper extremity supported;Feet supported Sitting balance-Leahy Scale: Fair   Postural control: Left lateral lean Standing balance support: Bilateral upper extremity supported Standing balance-Leahy Scale: Poor Standing balance comment: reliant on UE support                           ADL either performed or assessed with clinical judgement   ADL Overall ADL's : Needs assistance/impaired Eating/Feeding: Independent   Grooming: Wash/dry hands;Wash/dry face;Set up;Sitting   Upper Body Bathing: Set up;Sitting   Lower Body Bathing: Moderate assistance;Sit to/from stand   Upper Body Dressing : Set up;Sitting   Lower Body Dressing: Moderate assistance;Sit to/from stand               Functional mobility during ADLs: Moderate assistance;Rolling walker General ADL Comments: painful R hip with weight bearing     Vision Baseline Vision/History: Wears glasses Patient Visual Report: No change from baseline       Perception     Praxis      Pertinent Vitals/Pain Faces Pain Scale: Hurts whole lot Pain Location: R hip with movement, particularily ADd of R hip with bed mobility Pain Descriptors / Indicators: Aching;Operative site guarding Pain Intervention(s): Monitored during session     Hand Dominance Right   Extremity/Trunk Assessment Upper Extremity Assessment Upper Extremity Assessment: Overall WFL for tasks assessed   Lower Extremity Assessment Lower Extremity Assessment: Defer  to PT evaluation   Cervical / Trunk Assessment Cervical / Trunk Assessment: Normal   Communication Communication Communication: No difficulties   Cognition Arousal/Alertness: Awake/alert Behavior During Therapy: WFL for tasks assessed/performed Overall Cognitive Status: Within Functional Limits for tasks assessed                                       Home Living Family/patient expects to be discharged to::  Private residence Living Arrangements: Alone Available Help at Discharge: Friend(s);Available 24 hours/day Type of Home: Apartment Home Access: Level entry     Home Layout: One level     Bathroom Shower/Tub: Teacher, early years/pre: Standard     Home Equipment: Cane - single point          Prior Functioning/Environment Level of Independence: Independent with assistive device(s)        Comments: Uses cane for ambulation.  Did not need any assist for ADL/IADL, meds, money management.        OT Problem List: Decreased strength;Decreased range of motion;Decreased activity tolerance;Impaired balance (sitting and/or standing);Decreased knowledge of use of DME or AE;Pain      OT Treatment/Interventions: Self-care/ADL training;DME and/or AE instruction;Balance training;Therapeutic activities    OT Goals(Current goals can be found in the care plan section) Acute Rehab OT Goals Patient Stated Goal: I want to go home OT Goal Formulation: With patient Time For Goal Achievement: 01/01/21 Potential to Achieve Goals: Fair ADL Goals Pt Will Perform Grooming: with modified independence;standing Pt Will Perform Lower Body Bathing: with modified independence;sit to/from stand;with adaptive equipment Pt Will Perform Lower Body Dressing: with modified independence;sit to/from stand;with adaptive equipment Pt Will Transfer to Toilet: with modified independence;ambulating;regular height toilet Pt Will Perform Toileting - Clothing Manipulation and hygiene: with modified independence;sit to/from stand Pt Will Perform Tub/Shower Transfer: with modified independence;Stand pivot transfer  OT Frequency: Min 2X/week   Barriers to D/C:    none noted       Co-evaluation              AM-PAC OT "6 Clicks" Daily Activity     Outcome Measure Help from another person eating meals?: None Help from another person taking care of personal grooming?: None Help from another person  toileting, which includes using toliet, bedpan, or urinal?: A Lot Help from another person bathing (including washing, rinsing, drying)?: A Lot Help from another person to put on and taking off regular upper body clothing?: None Help from another person to put on and taking off regular lower body clothing?: A Lot 6 Click Score: 18   End of Session Equipment Utilized During Treatment: Gait belt;Rolling walker;Oxygen Nurse Communication: Mobility status  Activity Tolerance: Patient limited by pain Patient left: in chair;with call bell/phone within reach;with chair alarm set  OT Visit Diagnosis: Unsteadiness on feet (R26.81);Pain Pain - Right/Left: Right Pain - part of body: Leg                Time: 1400-1425 OT Time Calculation (min): 25 min Charges:  OT General Charges $OT Visit: 1 Visit OT Evaluation $OT Eval Moderate Complexity: 1 Mod OT Treatments $Self Care/Home Management : 8-22 mins  12/18/2020  Rich, OTR/L  Acute Rehabilitation Services  Office:  [phone]   Metta Clines 12/18/2020, 2:33 PM

## 2020-12-18 NOTE — Progress Notes (Signed)
Physical Therapy Treatment Patient Details Name: Desiree [name] MRN: [id number] DOB: October 04, 1951 Today's Date: 12/18/2020    History of Present Illness Pt is a 69 y/o female s/p R THA, direct anterior. PMH includes CVA, s/p ACDF, Bipolar disorder, CKD, COPD, and DM.    PT Comments    Patient continues to be severely limited by pain in R hip. Patient requires mod-maxA for sit to stand transfer with RW. Patient able to perform stand pivot bed>BSC>recliner this session with minA for RW management. Discussed with patient about benefits of short term rehab prior to returning home due to decreased caregiver support at home. Patient in agreement. Recommend SNF following discharge to assist in return to functional independence.     Follow Up Recommendations  SNF     Equipment Recommendations  Rolling Quinetta Shilling with 5" wheels;3in1 (PT)    Recommendations for Other Services       Precautions / Restrictions Precautions Precautions: Fall Restrictions Weight Bearing Restrictions: Yes RLE Weight Bearing: Weight bearing as tolerated    Mobility  Bed Mobility Overal bed mobility: Needs Assistance Bed Mobility: Supine to Sit     Supine to sit: Mod assist     General bed mobility comments: modA for R LE assist and scooting hips to EOB    Transfers Overall transfer level: Needs assistance Equipment used: Rolling Raena Pau (2 wheeled) Transfers: Sit to/from [employer] Sit to Stand: Max assist;Mod assist Stand pivot transfers: Min assist       General transfer comment: maxA for boost up during sit to stand from EOB, modA from [facility], cues for hand placement. Patient able to take pivotal steps for stand pivot transfer bed>BSC>recliner. MinA for RW management  Ambulation/Gait             General Gait Details: deferred due to pain and fatigue   Stairs             Wheelchair Mobility    Modified Rankin (Stroke Patients Only)       Balance Overall balance  assessment: Needs assistance Sitting-balance support: Bilateral upper extremity supported;Feet supported Sitting balance-Leahy Scale: Poor Sitting balance - Comments: requires at least 1 UE support to maintain sitting balance   Standing balance support: Bilateral upper extremity supported Standing balance-Leahy Scale: Poor Standing balance comment: reliant on UE support                            Cognition Arousal/Alertness: Awake/alert Behavior During Therapy: WFL for tasks assessed/performed Overall Cognitive Status: Within Functional Limits for tasks assessed                                        Exercises      General Comments        Pertinent Vitals/Pain Pain Assessment: Faces Faces Pain Scale: Hurts whole lot Pain Location: R hip Pain Descriptors / Indicators: Aching;Operative site guarding Pain Intervention(s): Monitored during session;Repositioned    Home Living                      Prior Function            PT Goals (current goals can now be found in the care plan section) Acute Rehab PT Goals Patient Stated Goal: to decrease pain PT Goal Formulation: With patient Time For Goal Achievement: 12/31/20 Potential to Achieve  Goals: Fair Progress towards PT goals: Progressing toward goals    Frequency    7X/week      PT Plan Current plan remains appropriate    Co-evaluation              AM-PAC PT "6 Clicks" Mobility   Outcome Measure  Help needed turning from your back to your side while in a flat bed without using bedrails?: A Little Help needed moving from lying on your back to sitting on the side of a flat bed without using bedrails?: A Lot Help needed moving to and from a bed to a chair (including a wheelchair)?: A Lot Help needed standing up from a chair using your arms (e.g., wheelchair or bedside chair)?: A Lot Help needed to walk in hospital room?: A Little Help needed climbing 3-5 steps with a  railing? : A Lot 6 Click Score: 14    End of Session Equipment Utilized During Treatment: Gait belt Activity Tolerance: Patient limited by pain Patient left: in chair;with call bell/phone within reach;with chair alarm set Nurse Communication: Mobility status PT Visit Diagnosis: Other abnormalities of gait and mobility (R26.89);Pain Pain - Right/Left: Right Pain - part of body: Hip     Time: 2725-3664 PT Time Calculation (min) (ACUTE ONLY): 46 min  Charges:  $Therapeutic Activity: 38-52 mins                     Aerika Groll A. [name] PT, DPT Acute Rehabilitation Services Pager [phone] Office [phone]    Viviann Spare 12/18/2020, 9:26 AM

## 2020-12-18 NOTE — Progress Notes (Signed)
Pt's friend will be staying with her when she goes home.. Slow mobility due to pain. Will improve if therapy works with her. I changed her to admit status. Continue therapy with goal of going back home.

## 2020-12-19 LAB — CBC
HCT: 30.2 % — ABNORMAL LOW (ref 36.0–46.0)
Hemoglobin: 9.5 g/dL — ABNORMAL LOW (ref 12.0–15.0)
MCH: 29.6 pg (ref 26.0–34.0)
MCHC: 31.5 g/dL (ref 30.0–36.0)
MCV: 94.1 fL (ref 80.0–100.0)
Platelets: 190 10*3/uL (ref 150–400)
RBC: 3.21 MIL/uL — ABNORMAL LOW (ref 3.87–5.11)
RDW: 13.7 % (ref 11.5–15.5)
WBC: 9.5 10*3/uL (ref 4.0–10.5)
nRBC: 0 % (ref 0.0–0.2)

## 2020-12-19 LAB — GLUCOSE, CAPILLARY
Glucose-Capillary: 91 mg/dL (ref 70–99)
Glucose-Capillary: 120 mg/dL — ABNORMAL HIGH (ref 70–99)
Glucose-Capillary: 84 mg/dL (ref 70–99)

## 2020-12-19 NOTE — Plan of Care (Signed)
  Problem: Education: Goal: Knowledge of General Education information will improve Description: Including pain rating scale, medication(s)/side effects and non-pharmacologic comfort measures Outcome: Progressing   Problem: Health Behavior/Discharge Planning: Goal: Ability to manage health-related needs will improve Outcome: Progressing   Problem: Clinical Measurements: Goal: Ability to maintain clinical measurements within normal limits will improve Outcome: Progressing   Problem: Activity: Goal: Risk for activity intolerance will decrease Outcome: Progressing   Problem: Nutrition: Goal: Adequate nutrition will be maintained Outcome: Progressing   Problem: Coping: Goal: Level of anxiety will decrease Outcome: Progressing   Problem: Elimination: Goal: Will not experience complications related to urinary retention Outcome: Progressing   Problem: Pain Managment: Goal: General experience of comfort will improve Outcome: Progressing   Problem: Safety: Goal: Ability to remain free from injury will improve Outcome: Progressing   

## 2020-12-19 NOTE — Progress Notes (Signed)
Occupational Therapy Treatment Patient Details Name: Desiree [name] MRN: [id number] DOB: 09-Jul-1951 Today's Date: 12/19/2020    History of present illness Pt is a 70 y/o female s/p R THA, direct anterior. PMH includes CVA, s/p ACDF, Bipolar disorder, CKD, COPD, and DM.   OT comments  Pt received semi-reclined in bed, pt agreeable to OT tx. Since last tx session, pt has progressed well, actively participating in functional self-care tasks and functional mobility with higher level of independence. Pt continues to exhibit pain, imbalance, fatigue, weakness, low activity tolerance. Today, pt required min assist-min guard for bed mobility, min assist for sit>stand using RW, min assist for functional mobility from bed>toilet>sink>chair using RW for support, min guard-min assist for LB self-care (toileting), and min guard for standing at sink to perform grooming activities (RW for support). Educated pt on d/c planning, activity pacing, adaptive ADL strategies, DME/AE. Pt would benefit from continued skilled acute care OT services to maximize independence with ADLs/ADL mobility. Pt reports now having 24/7 supervision/assistance available from a friend upon d/c.     Follow Up Recommendations  Home health OT;Supervision/Assistance - 24 hour    Equipment Recommendations  3 in 1 bedside commode;Tub/shower bench    Recommendations for Other Services Other (comment) (None)    Precautions / Restrictions Precautions Precautions: Fall Restrictions Weight Bearing Restrictions: Yes RLE Weight Bearing: Weight bearing as tolerated Other Position/Activity Restrictions: R hip       Mobility Bed Mobility Overal bed mobility: Needs Assistance Bed Mobility: Supine to Sit     Supine to sit: Min guard Sit to supine: Min guard;Supervision   General bed mobility comments: min assist-min guard to support operative leg, using bed railing    Transfers Overall transfer level: Needs assistance Equipment used:  Rolling walker (2 wheeled) Transfers: Sit to/from Stand Sit to Stand: Min assist         General transfer comment: min assist for sit>stand from EOB, min assist to ambulate from bed>toilet>sink>chair with RW for support    Balance Overall balance assessment: Needs assistance Sitting-balance support: Feet supported Sitting balance-Leahy Scale: Good     Standing balance support: Bilateral upper extremity supported;During functional activity Standing balance-Leahy Scale: Fair Standing balance comment: reliant on UE support; 1 LOB episode requiring external support to correct     ADL either performed or assessed with clinical judgement   ADL Overall ADL's : Needs assistance/impaired Eating/Feeding: Independent Eating/Feeding Details (indicate cue type and reason): sitting in chair Grooming: Wash/dry hands;Wash/dry face;Min guard;Standing Grooming Details (indicate cue type and reason): at sink using RW     Toilet Transfer: Min guard;Minimal assistance;Cueing for safety;Ambulation;BSC;RW (BSC over toilet) Toilet Transfer Details (indicate cue type and reason): min guard-min assist for sit>stand from bed, ambulation from bed>toilet>sink>chair using RW for support Toileting- Clothing Manipulation and Hygiene: Min guard;Supervision/safety;Sitting/lateral lean Toileting - Clothing Manipulation Details (indicate cue type and reason): for perineal/perianal care sitting on toilet     Functional mobility during ADLs: Minimal assistance;Min guard;Cueing for safety;Rolling walker General ADL Comments: +1 LOB, able to mostly self-correct but therapist there for safety     Cognition Arousal/Alertness: Awake/alert Behavior During Therapy: WFL for tasks assessed/performed Overall Cognitive Status: Within Functional Limits for tasks assessed              General Comments urinated in toilet with BSC over top, placed in chair at end of session, assisted pt in ordering dinner    Pertinent  Vitals/ Pain       Pain Assessment: 0-10  Pain Score: 3  Faces Pain Scale: Hurts a little bit Pain Location: R hip with movement and weight bearing Pain Descriptors / Indicators: Aching;Operative site guarding Pain Intervention(s): Monitored during session;Premedicated before session;Repositioned  Frequency  Min 2X/week        Progress Toward Goals  OT Goals(current goals can now be found in the care plan section)  Progress towards OT goals: Progressing toward goals  Acute Rehab OT Goals Patient Stated Goal: I want to go home OT Goal Formulation: With patient Time For Goal Achievement: 01/01/21 Potential to Achieve Goals: Good ADL Goals Pt Will Perform Grooming: with modified independence;standing Pt Will Perform Lower Body Bathing: with modified independence;sit to/from stand;with adaptive equipment Pt Will Perform Lower Body Dressing: with modified independence;sit to/from stand;with adaptive equipment Pt Will Transfer to Toilet: with modified independence;ambulating;regular height toilet Pt Will Perform Toileting - Clothing Manipulation and hygiene: with modified independence;sit to/from stand Pt Will Perform Tub/Shower Transfer: with modified independence;Stand pivot transfer  Plan Discharge plan remains appropriate       AM-PAC OT "6 Clicks" Daily Activity     Outcome Measure   Help from another person eating meals?: None Help from another person taking care of personal grooming?: A Little Help from another person toileting, which includes using toliet, bedpan, or urinal?: A Little Help from another person bathing (including washing, rinsing, drying)?: A Lot Help from another person to put on and taking off regular upper body clothing?: None Help from another person to put on and taking off regular lower body clothing?: A Little 6 Click Score: 19    End of Session Equipment Utilized During Treatment: Gait belt;Rolling walker  OT Visit Diagnosis: Unsteadiness on  feet (R26.81);Pain Pain - Right/Left: Right Pain - part of body: Leg   Activity Tolerance Patient tolerated treatment well   Patient Left in chair;with call bell/phone within reach   Nurse Communication Mobility status     Time: 9147-8295 OT Time Calculation (min): 24 min  Charges: OT General Charges $OT Visit: 1 Visit OT Treatments $Self Care/Home Management : 23-37 mins  Norris Cross, OTR/L [facility] [phone]  Mechele Claude 12/19/2020, 5:18 PM

## 2020-12-19 NOTE — TOC Progression Note (Signed)
Transition of Care [facility]) - Progression Note    Patient Details  Name: Desiree [name] MRN: [id number] Date of Birth: 1950/10/26  Transition of Care [facility]) CM/SW Contact  Miela Desjardin, [place], [place] Phone Number: 12/19/2020, 11:45 AM  Clinical Narrative:    Met with patient at bedside. Patient now declining SNF stating that she would rather discharge home with home health services. Per patient, she has a friend that is willing to assist her with her home care needs.  Transition of Care to continue to follow for discharge needs.  [address], LCSW Transition of Care [phone]   Expected Discharge Plan: Skilled Nursing Facility Barriers to Discharge: Continued Medical Work up  Expected Discharge Plan and Services Expected Discharge Plan: [facility] arrangements for the past 2 months: Apartment                 DME Arranged: 3-N-1,Walker rolling DME Agency: [employer]                   Social Determinants of Health (SDOH) Interventions    Readmission Risk Interventions No flowsheet data found.

## 2020-12-19 NOTE — Progress Notes (Signed)
Physical Therapy Treatment Patient Details Name: Desiree [name] MRN: [id number] DOB: 1951/07/05 Today's Date: 12/19/2020    History of Present Illness Pt is a 70 y/o female s/p R THA, direct anterior. PMH includes CVA, s/p ACDF, Bipolar disorder, CKD, COPD, and DM.    PT Comments    Continuing work on functional mobility and activity tolerance;  Noteworthy improvements in needing less assist to getup to EOB and able to stand from 3in1 commode using armrests with min assist; Pt was also pleased with her ability to walk today -- only a short distance to and from bathroom, but it's the farthest she has walked so far postop; We are hoping she is turning a corner; Have updated dc plan to going home with assistance from a friend who will stay around the clock initially   Follow Up Recommendations  Home health PT;Supervision/Assistance - 24 hour     Equipment Recommendations  Rolling walker with 5" wheels;3in1 (PT)    Recommendations for Other Services OT consult     Precautions / Restrictions Precautions Precautions: Fall Restrictions RLE Weight Bearing: Weight bearing as tolerated    Mobility  Bed Mobility Overal bed mobility: Needs Assistance Bed Mobility: Supine to Sit     Supine to sit: Min assist     General bed mobility comments: Lots of cues for technqiue to use LLE to half-bridge hips towards EOB; min assist for slight lift of R foot to alleviate friction and allow for smoother movement toward EOB; pushed up to sit through elbow propping, and used rails to pull up; lots of extra time    Transfers Overall transfer level: Needs assistance Equipment used: Rolling walker (2 wheeled) Transfers: Sit to/from Stand Sit to Stand: Mod assist;Min assist         General transfer comment: Stood from bed with mod assist to boost; lots of cues and trialing of various hand placements for pt; stood from 3in1 with min assist and good use of rails  Ambulation/Gait Ambulation/Gait  assistance: Min guard (with physical contact) Gait Distance (Feet): 14 Feet (to and from bathroom) Assistive device: Rolling walker (2 wheeled) Gait Pattern/deviations: Decreased stance time - right;Decreased step length - left     General Gait Details: Cues for optimal gait sequence; able to bear down into RW for better tolerance of R stance; slow, small steps   Stairs             Wheelchair Mobility    Modified Rankin (Stroke Patients Only)       Balance     Sitting balance-Leahy Scale: Fair       Standing balance-Leahy Scale: Poor                              Cognition Arousal/Alertness: Awake/alert Behavior During Therapy: WFL for tasks assessed/performed Overall Cognitive Status: Within Functional Limits for tasks assessed                                        Exercises      General Comments General comments (skin integrity, edema, etc.): Voided in bathroom, some urine leakage while walking in, doffed pt's socks and Ted hose at end of session      Pertinent Vitals/Pain Pain Assessment: 0-10 Pain Score: 6  Pain Location: R hip with movement and weight bearing Pain Descriptors / Indicators:  Aching;Operative site guarding Pain Intervention(s): Monitored during session    Home Living                      Prior Function            PT Goals (current goals can now be found in the care plan section) Acute Rehab PT Goals Patient Stated Goal: I want to go home PT Goal Formulation: With patient Time For Goal Achievement: 12/31/20 Potential to Achieve Goals: Good Progress towards PT goals: Progressing toward goals    Frequency    7X/week      PT Plan Discharge plan needs to be updated    Co-evaluation              AM-PAC PT "6 Clicks" Mobility   Outcome Measure  Help needed turning from your back to your side while in a flat bed without using bedrails?: A Little Help needed moving from lying on  your back to sitting on the side of a flat bed without using bedrails?: A Little Help needed moving to and from a bed to a chair (including a wheelchair)?: A Lot Help needed standing up from a chair using your arms (e.g., wheelchair or bedside chair)?: A Lot Help needed to walk in hospital room?: A Little Help needed climbing 3-5 steps with a railing? : A Lot 6 Click Score: 15    End of Session Equipment Utilized During Treatment: Gait belt Activity Tolerance: Patient tolerated treatment well Patient left: in chair;with call bell/phone within reach;with chair alarm set Nurse Communication: Mobility status PT Visit Diagnosis: Other abnormalities of gait and mobility (R26.89);Pain Pain - Right/Left: Right Pain - part of body: Hip     Time: 0927-0956 PT Time Calculation (min) (ACUTE ONLY): 29 min  Charges:  $Gait Training: 8-22 mins $Therapeutic Activity: 8-22 mins                     Van Clines, PT  Acute Rehabilitation Services Pager [phone] Office [phone]    Desiree [name] 12/19/2020, 1:04 PM

## 2020-12-19 NOTE — Progress Notes (Signed)
     Subjective: 2 Days Post-Op Procedure(s) (LRB): TOTAL HIP ARTHROPLASTY-DIRECT ANTERIOR (Right) Awake, alert and oriented x 4. Dressing right hip is dry. PT plans to continue to work with her, she has only ambulated to bathroom and back, has a long hallway to negotiate at home.   Patient reports pain as mild.    Objective:   VITALS:  Temp:  [98.8 F (37.1 C)-100.5 F (38.1 C)] 99.9 F (37.7 C) (02/27 0808) Pulse Rate:  [90-110] 90 (02/27 0850) Resp:  [15-18] 18 (02/27 0850) BP: (103-129)/(54-66) 106/54 (02/27 0808) SpO2:  [91 %-96 %] 96 % (02/27 0850)  Neurologically intact ABD soft Neurovascular intact Sensation intact distally Intact pulses distally Dorsiflexion/Plantar flexion intact Incision: dressing C/D/I   LABS Recent Labs    12/18/20 0129 12/19/20 0146  HGB 10.7* 9.5*  WBC 7.5 9.5  PLT 224 190   Recent Labs    12/18/20 0129  NA 134*  K 4.2  CL 101  CO2 26  BUN 19  CREATININE 1.60*  GLUCOSE 115*   No results for input(s): LABPT, INR in the last 72 hours.   Assessment/Plan: 2 Days Post-Op Procedure(s) (LRB): TOTAL HIP ARTHROPLASTY-DIRECT ANTERIOR (Right)  Advance diet Up with therapy D/C IV fluids Plan for discharge tomorrow Discharge home with home health  Desiree [name] 12/19/2020, 11:45 AMPatient ID: Desiree [name], female   DOB: 29-Apr-1951, 70 y.o.   MRN: [id number]

## 2020-12-20 ENCOUNTER — Encounter (HOSPITAL_COMMUNITY): Payer: Self-pay | Admitting: Orthopaedic Surgery

## 2020-12-20 LAB — GLUCOSE, CAPILLARY
Glucose-Capillary: 93 mg/dL (ref 70–99)
Glucose-Capillary: 113 mg/dL — ABNORMAL HIGH (ref 70–99)

## 2020-12-20 MED ORDER — BISACODYL 10 MG RE SUPP
10.0000 mg | Freq: Once | RECTAL | Status: AC
  Administered 2020-12-20: 10 mg via RECTAL
  Filled 2020-12-20: qty 1

## 2020-12-20 NOTE — Progress Notes (Signed)
Physical Therapy Treatment Patient Details Name: Desiree [name] MRN: [id number] DOB: 01/09/1951 Today's Date: 12/20/2020    History of Present Illness Pt is a 70 y/o female s/p R THA, direct anterior. PMH includes CVA, s/p ACDF, Bipolar disorder, CKD, COPD, and DM.    PT Comments    Continuing work on functional mobility and activity tolerance;  Session focused on going over therapeutic exercises with Surgical Hip Education sheet; Questions solicited and answered; on track for dc home tomorrow   Follow Up Recommendations  Home health PT;Supervision/Assistance - 24 hour     Equipment Recommendations  Rolling walker with 5" wheels;3in1 (PT)    Recommendations for Other Services       Precautions / Restrictions Precautions Precautions: Fall Precaution Comments: Fall risk decreasing Restrictions Weight Bearing Restrictions: No RLE Weight Bearing: Weight bearing as tolerated    Mobility  Bed Mobility                    Transfers                    Ambulation/Gait                 Stairs             Wheelchair Mobility    Modified Rankin (Stroke Patients Only)       Balance                                            Cognition Arousal/Alertness: Awake/alert Behavior During Therapy: WFL for tasks assessed/performed Overall Cognitive Status: Within Functional Limits for tasks assessed                                        Exercises Total Joint Exercises Ankle Circles/Pumps: AROM;Both;20 reps Quad Sets: AROM;Right;10 reps Gluteal Sets: AROM;Both;10 reps Towel Squeeze: AROM;Both;10 reps Short Arc Quad: AROM;Right;10 reps Heel Slides: AAROM;AROM;Right;10 reps Hip ABduction/ADduction: AAROM;Right;10 reps    General Comments        Pertinent Vitals/Pain Pain Assessment: Faces Faces Pain Scale: Hurts even more Pain Location: R hip with movement and weight bearing Pain Descriptors / Indicators:  Aching;Operative site guarding Pain Intervention(s): Monitored during session    Home Living                      Prior Function            PT Goals (current goals can now be found in the care plan section) Acute Rehab PT Goals Patient Stated Goal: I want to go home PT Goal Formulation: With patient Time For Goal Achievement: 12/31/20 Potential to Achieve Goals: Good Progress towards PT goals: Progressing toward goals    Frequency    7X/week      PT Plan Current plan remains appropriate    Co-evaluation              AM-PAC PT "6 Clicks" Mobility   Outcome Measure  Help needed turning from your back to your side while in a flat bed without using bedrails?: A Little Help needed moving from lying on your back to sitting on the side of a flat bed without using bedrails?: A Little Help needed moving to and from a bed to a  chair (including a wheelchair)?: A Little Help needed standing up from a chair using your arms (e.g., wheelchair or bedside chair)?: None Help needed to walk in hospital room?: None Help needed climbing 3-5 steps with a railing? : A Little 6 Click Score: 20    End of Session   Activity Tolerance: Patient tolerated treatment well Patient left: in bed;with call bell/phone within reach;with bed alarm set Nurse Communication: Mobility status;Other (comment) (pt need to get to bathroom) PT Visit Diagnosis: Other abnormalities of gait and mobility (R26.89);Pain Pain - Right/Left: Right Pain - part of body: Hip     Time: 2956-2130 PT Time Calculation (min) (ACUTE ONLY): 13 min  Charges:  $Therapeutic Exercise: 8-22 mins                     Van Clines, PT  Acute Rehabilitation Services Pager [phone] Office [phone]    Levi Aland 12/20/2020, 7:10 PM

## 2020-12-20 NOTE — Plan of Care (Signed)

## 2020-12-20 NOTE — Progress Notes (Signed)
Physical Therapy Treatment Patient Details Name: Desiree [name] MRN: [id number] DOB: 03/18/1951 Today's Date: 12/20/2020    History of Present Illness Pt is a 70 y/o female s/p R THA, direct anterior. PMH includes CVA, s/p ACDF, Bipolar disorder, CKD, COPD, and DM.    PT Comments    Continuing work on functional mobility and activity tolerance;  Session focused on progressive amb, after taking care of her immediate need to go to the bathroom; Notable progress with less need for physical assist for transfers, and able to increase amb distance -- managing household distance slowly, but well; On track for dc home tomorrow   Follow Up Recommendations  Home health PT;Supervision/Assistance - 24 hour     Equipment Recommendations  Rolling walker with 5" wheels;3in1 (PT)    Recommendations for Other Services       Precautions / Restrictions Precautions Precautions: Fall Precaution Comments: Fall risk decreasing Restrictions RLE Weight Bearing: Weight bearing as tolerated Other Position/Activity Restrictions: R hip    Mobility  Bed Mobility Overal bed mobility: Needs Assistance Bed Mobility: Supine to Sit     Supine to sit: Min guard (without physical contact)     General bed mobility comments: No need for physical assist to get up, used bedrails; flattened bed to more approximate home; occasional grimace    Transfers Overall transfer level: Needs assistance Equipment used: Rolling walker (2 wheeled) Transfers: Sit to/from Stand Sit to Stand: Min guard Stand pivot transfers: Min guard       General transfer comment: Pivoted bed to [facility] on her L side, then stood from [facility]; Cues for hand placement and safety, in particular, to push up from [facility]  Ambulation/Gait Ambulation/Gait assistance: Min guard (with physical contact) Gait Distance (Feet): 80 Feet Assistive device: Rolling walker (2 wheeled) Gait Pattern/deviations: Decreased stance time - right;Decreased step length -  left;Step-through pattern     General Gait Details: Step-through pattern emerging; cues to stand tall on RLE in stance, and activate gluteals and quads for more upright posture and stability at teh hip   Stairs             Wheelchair Mobility    Modified Rankin (Stroke Patients Only)       Balance     Sitting balance-Leahy Scale: Good       Standing balance-Leahy Scale: Fair                              Cognition Arousal/Alertness: Awake/alert Behavior During Therapy: WFL for tasks assessed/performed Overall Cognitive Status: Within Functional Limits for tasks assessed                                        Exercises      General Comments General comments (skin integrity, edema, etc.): Discussed considerations for DC home; Pt tells me she does not have stairs to enter her home; Provided pt with surgical Hip Exercise sheet, and will plan to go over this afternoon      Pertinent Vitals/Pain Pain Assessment: 0-10 Pain Score: 5  Pain Location: R hip with movement and weight bearing Pain Descriptors / Indicators: Aching;Operative site guarding    Home Living                      Prior Function  PT Goals (current goals can now be found in the care plan section) Acute Rehab PT Goals Patient Stated Goal: I want to go home PT Goal Formulation: With patient Time For Goal Achievement: 12/31/20 Potential to Achieve Goals: Good Progress towards PT goals: Progressing toward goals    Frequency    7X/week      PT Plan Current plan remains appropriate    Co-evaluation              AM-PAC PT "6 Clicks" Mobility   Outcome Measure  Help needed turning from your back to your side while in a flat bed without using bedrails?: A Little Help needed moving from lying on your back to sitting on the side of a flat bed without using bedrails?: A Little Help needed moving to and from a bed to a chair (including a  wheelchair)?: A Little Help needed standing up from a chair using your arms (e.g., wheelchair or bedside chair)?: None Help needed to walk in hospital room?: None Help needed climbing 3-5 steps with a railing? : A Little 6 Click Score: 20    End of Session Equipment Utilized During Treatment: Gait belt Activity Tolerance: Patient tolerated treatment well Patient left: in chair;with call bell/phone within reach;with chair alarm set Nurse Communication: Mobility status PT Visit Diagnosis: Other abnormalities of gait and mobility (R26.89);Pain Pain - Right/Left: Right Pain - part of body: Hip     Time: 9233-0076 PT Time Calculation (min) (ACUTE ONLY): 24 min  Charges:  $Gait Training: 8-22 mins $Therapeutic Activity: 8-22 mins                     Van Clines, PT  Acute Rehabilitation Services Pager [phone] Office [phone]    Desiree [name] 12/20/2020, 10:27 AM

## 2020-12-20 NOTE — Progress Notes (Signed)
Pt refuses [place] at this time.  RT will continue to monitor.

## 2020-12-20 NOTE — Progress Notes (Signed)
   Subjective: 3 Days Post-Op Procedure(s) (LRB): TOTAL HIP ARTHROPLASTY-DIRECT ANTERIOR (Right) Patient reports pain as moderate.    Objective: Vital signs in last 24 hours: Temp:  [98.3 F (36.8 C)-99.8 F (37.7 C)] 98.3 F (36.8 C) (02/28 0749) Pulse Rate:  [96-101] 101 (02/28 0749) Resp:  [14-18] 14 (02/28 0749) BP: (107-115)/(58-62) 113/62 (02/28 0749) SpO2:  [90 %-99 %] 97 % (02/28 0822)  Intake/Output from previous day: 02/27 0701 - 02/28 0700 In: 360 [P.O.:360] Out: -  Intake/Output this shift: No intake/output data recorded.  Recent Labs    12/18/20 0129 12/19/20 0146  HGB 10.7* 9.5*   Recent Labs    12/18/20 0129 12/19/20 0146  WBC 7.5 9.5  RBC 3.49* 3.21*  HCT 32.1* 30.2*  PLT 224 190   Recent Labs    12/18/20 0129  NA 134*  K 4.2  CL 101  CO2 26  BUN 19  CREATININE 1.60*  GLUCOSE 115*  CALCIUM 8.4*   No results for input(s): LABPT, INR in the last 72 hours.  Neurologically intact No results found.  Assessment/Plan: 3 Days Post-Op Procedure(s) (LRB): TOTAL HIP ARTHROPLASTY-DIRECT ANTERIOR (Right) Up with therapy, plan home Tuesday. Has not done stairs yet.   Eldred Manges 12/20/2020, 9:30 AM

## 2020-12-21 LAB — GLUCOSE, CAPILLARY
Glucose-Capillary: 106 mg/dL — ABNORMAL HIGH (ref 70–99)
Glucose-Capillary: 133 mg/dL — ABNORMAL HIGH (ref 70–99)

## 2020-12-21 NOTE — Plan of Care (Signed)
  Problem: Clinical Measurements: Goal: Will remain free from infection Outcome: Progressing Goal: Diagnostic test results will improve Outcome: Progressing Goal: Cardiovascular complication will be avoided Outcome: Progressing   

## 2020-12-21 NOTE — Progress Notes (Signed)
Physical Therapy Treatment Patient Details Name: Desiree [name] MRN: [id number] DOB: 1951/05/23 Today's Date: 12/21/2020    History of Present Illness Pt is a 70 y/o female s/p R THA, direct anterior. PMH includes CVA, s/p ACDF, Bipolar disorder, CKD, COPD, and DM.    PT Comments    Continuing work on functional mobility and activity tolerance;  Discussed considerations for dc home, and pt is confident in her ability to manage, and her friend's ability to assist (this PT is confident as well); We discussed the therex sheet from yesterday, no questions; Assisted pt in getting to the bathroom with RW; Upon leaving bathroom, pt became quite nauseated, and so assisted her to the recliner; RN notified; Nausea likely form medications taken on an empty stomach -- pt did not have a look of presyncope; I still anticipate DC home today; Plan to come back in the early afternoon for second session   Follow Up Recommendations  Follow surgeon's recommendation for DC plan and follow-up therapies;Other (comment);Outpatient PT (Very nice progress and pt is confident in her help at home; may not need HHPT, and can go to Outpt PT)     Equipment Recommendations  Rolling walker with 5" wheels;3in1 (PT)    Recommendations for Other Services       Precautions / Restrictions Precautions Precautions: Fall Precaution Comments: Fall risk decreasing Restrictions RLE Weight Bearing: Weight bearing as tolerated Other Position/Activity Restrictions: R hip    Mobility  Bed Mobility               General bed mobility comments: Recieved in chair; Reported no difficulty getting up to EOB    Transfers Overall transfer level: Needs assistance Equipment used: Rolling walker (2 wheeled) Transfers: Sit to/from Stand Sit to Stand: Min guard         General transfer comment: Minguard to stand from recliner to RW and standard commode to RW; correct hand placement and good  control  Ambulation/Gait Ambulation/Gait assistance: Supervision Gait Distance (Feet): 15 Feet (to and from bathroom) Assistive device: Rolling walker (2 wheeled) Gait Pattern/deviations: Decreased stance time - right;Decreased step length - left;Step-through pattern     General Gait Details: Step-through pattern emerging; cues to stand tall on RLE in stance, and activate gluteals and quads for more upright posture and stability at the hip; amb distancelimited this morning by nausea   Stairs             Wheelchair Mobility    Modified Rankin (Stroke Patients Only)       Balance                                            Cognition Arousal/Alertness: Awake/alert Behavior During Therapy: WFL for tasks assessed/performed Overall Cognitive Status: Within Functional Limits for tasks assessed                                        Exercises Total Joint Exercises Long Arc Quad: AROM;Right;10 reps    General Comments General comments (skin integrity, edema, etc.): [name] reports her friend is ready to stay with her and assist; We discussed car transfers as well      Pertinent Vitals/Pain Pain Assessment: Faces Faces Pain Scale: Hurts a little bit Pain Location: R hip with movement  and weight bearing Pain Descriptors / Indicators: Aching;Operative site guarding Pain Intervention(s): Monitored during session    Home Living                      Prior Function            PT Goals (current goals can now be found in the care plan section) Acute Rehab PT Goals Patient Stated Goal: I want to go home PT Goal Formulation: With patient Time For Goal Achievement: 12/31/20 Potential to Achieve Goals: Good Progress towards PT goals: Progressing toward goals    Frequency    7X/week      PT Plan Current plan remains appropriate    Co-evaluation              AM-PAC PT "6 Clicks" Mobility   Outcome Measure   Help needed turning from your back to your side while in a flat bed without using bedrails?: A Little Help needed moving from lying on your back to sitting on the side of a flat bed without using bedrails?: None Help needed moving to and from a bed to a chair (including a wheelchair)?: A Little Help needed standing up from a chair using your arms (e.g., wheelchair or bedside chair)?: None Help needed to walk in hospital room?: None Help needed climbing 3-5 steps with a railing? : A Little 6 Click Score: 21    End of Session   Activity Tolerance: Patient tolerated treatment well;Other (comment) (though nausea limiting amb distance) Patient left: in chair;with call bell/phone within reach Nurse Communication: Mobility status;Other (comment) (pt nauseated; likely due to pain meds this am without food; did not have a look of presyncope or decr BP) PT Visit Diagnosis: Other abnormalities of gait and mobility (R26.89);Pain Pain - Right/Left: Right Pain - part of body: Hip     Time: 8016-5537 PT Time Calculation (min) (ACUTE ONLY): 22 min  Charges:  $Gait Training: 8-22 mins                     Van Clines, PT  Acute Rehabilitation Services Pager [phone] Office [phone]    Levi Aland 12/21/2020, 10:20 AM

## 2020-12-21 NOTE — Progress Notes (Signed)
Pt given discharge instructions and gone over with her, she verbalized understanding. Bandaged changed per order. No drainage from incision site. Pt given 3n1 from supply room and walker to be delivered to room. Pt belongings gathered to be sent home with her.

## 2020-12-21 NOTE — Progress Notes (Signed)
Physical Therapy Treatment Patient Details Name: Desiree [name] MRN: [id number] DOB: 09/21/1951 Today's Date: 12/21/2020    History of Present Illness Pt is a 70 y/o female s/p R THA, direct anterior. PMH includes CVA, s/p ACDF, Bipolar disorder, CKD, COPD, and DM.    PT Comments    Continuing work on functional mobility and activity tolerance;  Feeling better this afternoon and managing progressive amb well; OK for dc home from PT standpoint    Follow Up Recommendations  Follow surgeon's recommendation for DC plan and follow-up therapies;Other (comment);Outpatient PT     Equipment Recommendations  Rolling walker with 5" wheels;3in1 (PT)    Recommendations for Other Services       Precautions / Restrictions Precautions Precautions: Fall Precaution Comments: Fall risk decreasing Restrictions RLE Weight Bearing: Weight bearing as tolerated    Mobility  Bed Mobility               General bed mobility comments: Recieved in chair; Reported no difficulty getting up to EOB    Transfers Overall transfer level: Needs assistance Equipment used: Rolling walker (2 wheeled) Transfers: Sit to/from Stand Sit to Stand: Min guard         General transfer comment: Minguard to stand from recliner to RW and standard commode to RW; correct hand placement and good control  Ambulation/Gait Ambulation/Gait assistance: Supervision Gait Distance (Feet): 180 Feet Assistive device: Rolling walker (2 wheeled) Gait Pattern/deviations: Decreased stance time - right;Decreased step length - left;Step-through pattern     General Gait Details: Step-through pattern emerging; cues to stand tall on RLE in stance, and activate gluteals and quads for more upright posture and stability at the hip; amb distancelimited this morning by nausea   Stairs Stairs: Yes Stairs assistance: Supervision Stair Management: No rails;Step to pattern;With walker Number of Stairs: 1 (x2) General stair comments:  Opted to go over sequence to manage one step, in case she encounters a curb, etc; cues for sequence and technqiue; no difficulty   Wheelchair Mobility    Modified Rankin (Stroke Patients Only)       Balance     Sitting balance-Leahy Scale: Good       Standing balance-Leahy Scale: Fair                              Cognition Arousal/Alertness: Awake/alert Behavior During Therapy: WFL for tasks assessed/performed Overall Cognitive Status: Within Functional Limits for tasks assessed                                        Exercises      General Comments        Pertinent Vitals/Pain Pain Assessment: Faces Faces Pain Scale: Hurts a little bit Pain Location: R hip with movement and weight bearing Pain Descriptors / Indicators: Aching;Operative site guarding Pain Intervention(s): Monitored during session    Home Living                      Prior Function            PT Goals (current goals can now be found in the care plan section) Acute Rehab PT Goals Patient Stated Goal: I want to go home PT Goal Formulation: With patient Time For Goal Achievement: 12/31/20 Potential to Achieve Goals: Good Progress towards PT goals: Progressing toward goals  Frequency    7X/week      PT Plan Current plan remains appropriate    Co-evaluation              AM-PAC PT "6 Clicks" Mobility   Outcome Measure  Help needed turning from your back to your side while in a flat bed without using bedrails?: A Little Help needed moving from lying on your back to sitting on the side of a flat bed without using bedrails?: None Help needed moving to and from a bed to a chair (including a wheelchair)?: A Little Help needed standing up from a chair using your arms (e.g., wheelchair or bedside chair)?: None Help needed to walk in hospital room?: None Help needed climbing 3-5 steps with a railing? : A Little 6 Click Score: 21    End of  Session Equipment Utilized During Treatment: Gait belt Activity Tolerance: Patient tolerated treatment well;Other (comment) Patient left: in chair;with call bell/phone within reach (prepping to eat lunch) Nurse Communication: Mobility status PT Visit Diagnosis: Other abnormalities of gait and mobility (R26.89);Pain Pain - Right/Left: Right Pain - part of body: Hip     Time: 0263-7858 PT Time Calculation (min) (ACUTE ONLY): 22 min  Charges:  $Gait Training: 8-22 mins                     Van Clines, PT  Acute Rehabilitation Services Pager [phone] Office [phone]    Levi Aland 12/21/2020, 2:05 PM

## 2020-12-21 NOTE — Progress Notes (Signed)
Subjective: Doing well.  Pain controlled.  Physical therapist states that patient has made good progress and stable to d/c home today.   Objective: Vital signs in last 24 hours: Temp:  [98.4 F (36.9 C)-100.6 F (38.1 C)] 99.6 F (37.6 C) (03/01 0751) Pulse Rate:  [100-103] 103 (03/01 0751) Resp:  [16] 16 (03/01 0751) BP: (95-117)/(49-67) 114/67 (03/01 0751) SpO2:  [89 %-100 %] 90 % (03/01 0751)  Intake/Output from previous day: 02/28 0701 - 03/01 0700 In: 480 [P.O.:480] Out: 1000 [Urine:1000] Intake/Output this shift: No intake/output data recorded.  Recent Labs    12/19/20 0146  HGB 9.5*   Recent Labs    12/19/20 0146  WBC 9.5  RBC 3.21*  HCT 30.2*  PLT 190   No results for input(s): NA, K, CL, CO2, BUN, CREATININE, GLUCOSE, CALCIUM in the last 72 hours. No results for input(s): LABPT, INR in the last 72 hours.  Exam Pleasant female, alert and oriented, NAD.  Wound looks good. Staples intact.      Assessment/Plan: D/c home today.  Dressing changed.  F/u as scheduled with dr Ophelia Charter 2 weeks postop.   Desiree [name] 12/21/2020, 10:35 AM

## 2020-12-21 NOTE — Care Management Important Message (Signed)
Important Message  Patient Details  Name: Desiree [name] MRN: [id number] Date of Birth: November 10, 1950   Medicare Important Message Given:  Yes     Oralia Rud Ivanka Kirshner 12/21/2020, 2:33 PM

## 2020-12-21 NOTE — Plan of Care (Signed)
  Problem: Health Behavior/Discharge Planning: Goal: Ability to manage health-related needs will improve Outcome: Adequate for Discharge   Problem: Pain Managment: Goal: General experience of comfort will improve Outcome: Adequate for Discharge   Problem: Safety: Goal: Ability to remain free from injury will improve Outcome: Adequate for Discharge   

## 2020-12-21 NOTE — TOC Transition Note (Signed)
Transition of Care [facility]) - CM/SW Discharge Note   Patient Details  Name: Desiree [name] MRN: [id number] Date of Birth: [birth year]  Transition of Care [facility]) CM/SW Contact:  Epifanio Lesches, RN Phone Number: 12/21/2020, 12:39 PM   Clinical Narrative:    Patient will DC to: home Anticipated DC date: 12/21/2020 Family notified:  Transport by:       - s/p R THA, hx of CVA 2/25, ACDF, Bipolar disorder, CKD, COPD, and DM.  Per MD patient ready for DC today. RN, patient, and patient's family notified of DC. Pt states family to assist with care once d/c. Pt without HH needs per MD. DME walker and 3in1/ BSC will be delivered to bedside prior to d/c.   Pt without Rx med concerns.  Pt with noted post hospital f/u appointment on AVS   RNCM will sign off for now as intervention is no longer needed. Please consult [place] again if new needs arise.    Final next level of care: Home/Self Care Barriers to Discharge: No Barriers Identified   Patient Goals and CMS Choice Patient states their goals for this hospitalization and ongoing recovery are:: "I want to get up and move around" CMS Medicare.gov Compare Post Acute Care list provided to:: Patient Choice offered to / list presented to : Patient  Discharge Placement                       Discharge Plan and Services                DME Arranged: [name] rolling,3-N-1 DME Agency: [employer]                  Social Determinants of Health (SDOH) Interventions     Readmission Risk Interventions No flowsheet data found.

## 2020-12-22 ENCOUNTER — Telehealth: Payer: Self-pay | Admitting: *Deleted

## 2020-12-22 DIAGNOSIS — J479 Bronchiectasis, uncomplicated: Secondary | ICD-10-CM | POA: Diagnosis not present

## 2020-12-22 DIAGNOSIS — R269 Unspecified abnormalities of gait and mobility: Secondary | ICD-10-CM | POA: Diagnosis not present

## 2020-12-22 NOTE — Telephone Encounter (Signed)
Ortho bundle D/C call completed. 

## 2020-12-23 ENCOUNTER — Other Ambulatory Visit: Payer: Self-pay

## 2020-12-23 NOTE — Telephone Encounter (Signed)
Lmtcb for pt.  

## 2020-12-24 ENCOUNTER — Telehealth: Payer: Self-pay | Admitting: *Deleted

## 2020-12-24 NOTE — Telephone Encounter (Signed)
1 week Ortho bundle post op call completed. Patient states she is doing well. Only question is that she would like an extra pair of compression hose and asked where to get these. Directed her to the medical supply stores in town. Feels she is doing well at home. No other issues reported.

## 2020-12-24 NOTE — Telephone Encounter (Signed)
Checked TP's lookat and there was confirmation that the rx for Markus Daft was [place]ed to [place] [facility] [place]. I called and spoke with the pt and notified her that this was done. Nothing further needed.

## 2020-12-27 ENCOUNTER — Encounter: Payer: Self-pay | Admitting: Internal Medicine

## 2020-12-27 ENCOUNTER — Telehealth: Payer: Self-pay | Admitting: Neurology

## 2020-12-27 ENCOUNTER — Telehealth (INDEPENDENT_AMBULATORY_CARE_PROVIDER_SITE_OTHER): Payer: Medicare HMO | Admitting: Internal Medicine

## 2020-12-27 ENCOUNTER — Other Ambulatory Visit: Payer: Self-pay

## 2020-12-27 VITALS — Ht 62.0 in | Wt 156.3 lb

## 2020-12-27 DIAGNOSIS — I10 Essential (primary) hypertension: Secondary | ICD-10-CM

## 2020-12-27 DIAGNOSIS — R0683 Snoring: Secondary | ICD-10-CM | POA: Insufficient documentation

## 2020-12-27 DIAGNOSIS — Z96649 Presence of unspecified artificial hip joint: Secondary | ICD-10-CM

## 2020-12-27 DIAGNOSIS — Z79899 Other long term (current) drug therapy: Secondary | ICD-10-CM | POA: Diagnosis not present

## 2020-12-27 DIAGNOSIS — F99 Mental disorder, not otherwise specified: Secondary | ICD-10-CM | POA: Insufficient documentation

## 2020-12-27 DIAGNOSIS — F5105 Insomnia due to other mental disorder: Secondary | ICD-10-CM | POA: Insufficient documentation

## 2020-12-27 DIAGNOSIS — Z72821 Inadequate sleep hygiene: Secondary | ICD-10-CM | POA: Insufficient documentation

## 2020-12-27 DIAGNOSIS — G4701 Insomnia due to medical condition: Secondary | ICD-10-CM | POA: Insufficient documentation

## 2020-12-27 DIAGNOSIS — E1122 Type 2 diabetes mellitus with diabetic chronic kidney disease: Secondary | ICD-10-CM | POA: Diagnosis not present

## 2020-12-27 NOTE — Progress Notes (Signed)
   Virtual Visit via Telephone Note  I connected with@ on 12/27/20 at 11:00 AM EST by telephone and verified that I am speaking with the correct person using two identifiers.   I discussed the limitations, risks, security and privacy concerns of performing an evaluation and management service by telephone and the limited availability of in person appointments. tThere may be a patient responsible charge related to this service. The patient expressed understanding and agreed to proceed.  Location patient: home Location provider: work office Participants present for the call: patient, provider Patient did not have a visit in the prior 7 days to address this/these issue(s).   History of Present Illness: Desiree [name]    presents for preplanned 23-month follow-up for chronic medical disease. However she just had a total hip replacement 3 days ago but is doing well at home has family and friends coming to help his up and around. No complications of the surgery per history No unusual bleeding hypertension respiratory symptoms. On losartan   amlodipine She is on Linzess waiting for Dr. Kenna Gilbert office to do a PA Is on a new inhaler for respiratory condition and bronchiectasis will meet her new pulmonologist in the next month or so. No change in her rheumatologic status. Labs in record reviewed recently.     Observations/Objective: Patient sounds personable and well on the phone. I do not appreciate any SOB. Speech and thought processing are grossly intact. Patient reported vitals: Lab Results  Component Value Date   WBC 9.5 12/19/2020   HGB 9.5 (L) 12/19/2020   HCT 30.2 (L) 12/19/2020   PLT 190 12/19/2020   GLUCOSE 115 (H) 12/18/2020   CHOL 190 03/10/2020   TRIG 88.0 03/10/2020   HDL 57.90 03/10/2020   LDLDIRECT 146.3 01/26/2010   LDLCALC 114 (H) 03/10/2020   ALT 23 10/29/2020   AST 32 10/29/2020   NA 134 (L) 12/18/2020   K 4.2 12/18/2020   CL 101 12/18/2020   CREATININE 1.60 (H)  12/18/2020   BUN 19 12/18/2020   CO2 26 12/18/2020   TSH 2.153 10/10/2018   INR 1.16 10/09/2018   HGBA1C 5.9 (H) 12/13/2020   MICROALBUR 3.1 (H) 12/31/2015     Assessment and Plan: Essential hypertension  Controlled type 2 diabetes mellitus with chronic kidney disease, without long-term current use of insulin, unspecified CKD stage (HCC)  Medication management  Status post hip replacement, unspecified laterality anemia  Post surgery  Creat 1.6 will follow  Other conditions seem stable  Plan cpx and labs  In summer  July or August   Follow Up Instructions:  Continue meds     Fu cpx in summer   With labs as indicated  Or as needed    99441 5-10 99442 11-20 94443 21-30 I did not refer this patient for an OV in the next 24 hours for this/these issue(s).  I discussed the assessment and treatment plan with the patient. The patient was provided an opportunity to ask questions and answered. The patient agreed with the plan and demonstrated an understanding of the instructions.   The patient was advised to call back or seek an in-person evaluation if the symptoms worsen or if the condition fails to improve as anticipated.  I provided of non-face-to-face time during this encounter. Return for cpx  in summer 2022.  Berniece Andreas, MD Refills of linzess

## 2020-12-27 NOTE — Telephone Encounter (Signed)
Called the patient and reviewed the sleep study in detail.  Informed the patient that overall there was close to 6 hours of sleep noted.  Informed the patient there was no concern for sleep apnea or restlessness in her extremities.  Informed her that her oxygen and her heart rate levels stayed in the normal range.  Advised the patient there was 1 incident where she had sat straight up and then laid back down.  Patient feels that this was most likely related to her pain that she had.  Patient verbalized understanding of the results and will follow up with primary care.  Patient had no further questions at this time. Patient asked if the technician who did her study could give her a call.  When questioned the patient if there was something that I can help with, she said that there was a hair product that she had recommended her to use and she was going to ask what that was.  Informed her I would pass this along to the sleep lab so that they could question that with the technician and either the tech or myself will be in contact with her to advise from the answer.

## 2020-12-27 NOTE — Progress Notes (Signed)
The Wake baseline 02 saturation was 88%, with the lowest being 85%. Time spent below 89% saturation equaled 4 minutes. The arousals were noted as: 20 were spontaneous, 0 were associated with PLMs, 0 were associated with respiratory events. The patient had a total of 0 Periodic Limb Movements.  Audio and video analysis did show unusual movements, behaviors, as the patient suddenly sat up in bed and then went back to sleep, at 00.17.58 AM.  Snoring was not noted. EKG was in keeping with normal sinus rhythm (NSR).   IMPRESSION:  1. This PSG is negative for Obstructive Sleep Apnea (OSA) and Periodic Limb Movement Disorder (PLMD) 2. There was no Primary Snoring and no cardiac arrhythmia.  3. No clinically significant hypoxemia in sleep was noted. 4. There was one episode of sleep behavior without any vocalization- the patient sat up during REM sleep and fell back, but woke up- No seizure activity, just arousal.    RECOMMENDATIONS:  1. No organic causes for Insomnia were identified. Chronic pain is a possible cause of her insomnia - please follow up with psychiatry or PCP. Sleep perception is affected, the patient feels she hasn't slept but had clearly slept for about 6 hours.  No return to Sleep Medicine Clinic is indicated.

## 2020-12-27 NOTE — Discharge Summary (Signed)
Patient ID: SABREE NUON MRN: [id number] DOB/AGE: March 27, 1951 70 y.o.  Admit date: 12/17/2020 Discharge date: 12/21/2020 Admission Diagnoses:  Active Problems:   Arthritis of right hip   H/O total hip arthroplasty   Discharge Diagnoses:  Active Problems:   Arthritis of right hip   H/O total hip arthroplasty  status post Procedure(s): TOTAL HIP ARTHROPLASTY-DIRECT ANTERIOR  Past Medical History:  Diagnosis Date  . Acute gastritis without bleeding 08/08/2018  . Anemia   . Anxiety   . Asthma   . Bipolar disorder (HCC)   . CANDIDIASIS, ESOPHAGEAL 07/28/2009   Qualifier: Diagnosis of  By: Fabian Sharp MD, Neta Mends   . Chronic kidney disease    CKD III  . Complication of anesthesia    was told she stopped breathing for one of her finger surgeries  . COPD (chronic obstructive pulmonary disease) (HCC)   . Depression   . Diabetes mellitus without complication (HCC)    no meds  . Dyspnea    At rest,and with activity  . FH: colonic polyps   . Fractured elbow    right   . GERD (gastroesophageal reflux disease)   . Headache    migraines  . HH (hiatus hernia)   . History of carpal tunnel syndrome   . History of chest pain   . History of transfusion of packed red blood cells   . Hyperlipidemia   . Hypertension   . Mitral valve prolapse   . Neuroleptic-induced tardive dyskinesia   . Osteoarthritis of more than one site   . Pneumonia   . Seasonal allergies   . Stroke [facility])     Surgeries: Procedure(s): TOTAL HIP ARTHROPLASTY-DIRECT ANTERIOR on 12/17/2020   Consultants:   Discharged Condition: Improved  Hospital Course: KENZLEE FISHBURN is an 70 y.o. female who was admitted 12/17/2020 for operative treatment of right hip djd. Patient failed conservative treatments (please see the history and physical for the specifics) and had severe unremitting pain that affects sleep, daily activities and work/hobbies. After pre-op clearance, the patient was taken to the operating room on 12/17/2020  and underwent  Procedure(s): TOTAL HIP ARTHROPLASTY-DIRECT ANTERIOR.    Patient was given perioperative antibiotics:  Anti-infectives (From admission, onward)   Start     Dose/Rate Route Frequency Ordered Stop   12/17/20 1145  vancomycin (VANCOREADY) IVPB 1000 mg/200 mL  Status:  Discontinued        1,000 mg 200 mL/hr over 60 Minutes Intravenous On call to O.R. 12/17/20 1057 12/17/20 1118   12/17/20 0600  vancomycin (VANCOCIN) IVPB 1000 mg/200 mL premix        1,000 mg 200 mL/hr over 60 Minutes Intravenous On call to O.R. 12/17/20 0556 12/17/20 0730       Patient was given sequential compression devices and early ambulation to prevent DVT.   Patient benefited maximally from hospital stay and there were no complications. At the time of discharge, the patient was urinating/moving their bowels without difficulty, tolerating a regular diet, pain is controlled with oral pain medications and they have been cleared by PT/OT.   Recent vital signs: No data found.   Recent laboratory studies: No results for input(s): WBC, HGB, HCT, PLT, NA, K, CL, CO2, BUN, CREATININE, GLUCOSE, INR, CALCIUM in the last 72 hours.  Invalid input(s): PT, 2   Discharge Medications:   Allergies as of 12/21/2020      Reactions   Prednisone Shortness Of Breath, Itching, Nausea And Vomiting, Palpitations   Solu-medrol [methylprednisolone] Anaphylaxis  Amoxicillin Hives, Rash   Has patient had a PCN reaction causing immediate rash, facial/tongue/throat swelling, SOB or lightheadedness with hypotension:Yes Has patient had a PCN reaction causing severe rash involving mucus membranes or skin necrosis: No Has patient had a PCN reaction that required hospitalization: No Has patient had a PCN reaction occurring within the last 10 years: No If all of the above answers are "NO", then may proceed with Cephalosporin use.   Codeine Hives, Itching, Rash   Tolerated oxycodone and morphine previously   Hydrocodone-acetaminophen  Hives, Itching, Rash   Tolerated oxycodone and morphine previously   Penicillins Hives, Itching, Rash   ALLERGIC REACTION TO ORAL AMOXICILLIN Has patient had a PCN reaction causing immediate rash, facial/tongue/throat swelling, SOB or lightheadedness with hypotension: Yes Has patient had a PCN reaction causing severe rash involving mucus membranes or skin necrosis: No Has patient had a PCN reaction that required hospitalization: No Has patient had a PCN reaction occurring within the last 10 years: No If all of the above answers are "NO", then may proceed with Cephalosporin use.   Tape Other (See Comments)   sore      Medication List    STOP taking these medications   acetaminophen 500 MG tablet Commonly known as: TYLENOL     TAKE these medications   Accu-Chek Aviva Plus test strip Generic drug: glucose blood Use to test blood sugar once daily. Dx: e11.9   Accu-Chek Guide Control Liqd   Accu-Chek Softclix Lancets lancets Use to test blood sugar daily. Dx:e11.9   amLODipine 5 MG tablet Commonly known as: NORVASC TAKE ONE TABLET BY MOUTH EVERY MORNING What changed:   how much to take  how to take this  when to take this   aspirin EC 325 MG tablet Take 1 tablet (325 mg total) by mouth daily. Must take at least 4 weeks postop for DVT prophylaxis. What changed:   medication strength  how much to take  additional instructions   azelastine 0.1 % nasal spray Commonly known as: ASTELIN Place 2 sprays into both nostrils 2 (two) times daily. Use in each nostril as directed   B-D ASSURE BPM/AUTO ARM CUFF Misc Use to check blood pressure as directed.   B-D SINGLE USE SWABS REGULAR Pads   Breztri Aerosphere 160-9-4.8 MCG/ACT Aero Generic drug: Budeson-Glycopyrrol-Formoterol Inhale 2 puffs into the lungs in the morning and at bedtime.   Centrum Silver 50+Women Tabs Take 1 tablet by mouth daily.   cetirizine 10 MG tablet Commonly known as: ZYRTEC Take 10 mg by  mouth at bedtime.   diclofenac sodium 1 % Gel Commonly known as: VOLTAREN APPLY 2-4 GRAMS TO AFFECTED JOINTS UP TO FOUR TIMES DAILY What changed:   how much to take  how to take this  when to take this  reasons to take this  additional instructions   famotidine 40 MG tablet Commonly known as: PEPCID Take 1 tablet (40 mg total) by mouth daily.   feeding supplement Liqd Take 237 mLs by mouth 3 (three) times daily between meals.   fluticasone 50 MCG/ACT nasal spray Commonly known as: FLONASE Place 2 sprays into both nostrils at bedtime.   Flutter Devi 1 Device by Does not apply route as directed.   linaclotide 145 MCG Caps capsule Commonly known as: LINZESS Take 145 mcg by mouth daily.   losartan 25 MG tablet Commonly known as: COZAAR TAKE 1 TABLET(25 MG) BY MOUTH DAILY What changed:   how much to take  how to  take this  when to take this   methocarbamol 500 MG tablet Commonly known as: Robaxin Take 1 tablet (500 mg total) by mouth every 6 (six) hours as needed for muscle spasms.   montelukast 10 MG tablet Commonly known as: SINGULAIR TAKE 1 TABLET(10 MG) BY MOUTH AT BEDTIME What changed:   how much to take  how to take this  when to take this   Nitro-Bid 2 % ointment Generic drug: nitroGLYCERIN APPLY 1/4 INCH TOPICALLY TO AFFECTED FINGERTIPS 3 TIMES A DAY What changed:   how much to take  how to take this  when to take this   ondansetron 4 MG disintegrating tablet Commonly known as: ZOFRAN-ODT DISSOLVE 1 TABLET(4 MG) ON THE TONGUE EVERY 8 HOURS AS NEEDED FOR NAUSEA OR VOMITING What changed: See the new instructions.   oxyCODONE-acetaminophen 5-325 MG tablet Commonly known as: PERCOCET/ROXICET Take 1 tablet by mouth every 6 (six) hours as needed for severe pain.   Restasis 0.05 % ophthalmic emulsion Generic drug: cycloSPORINE Place 1 drop into both eyes 2 (two) times daily.   Theratears 0.25 % Soln Generic drug: Carboxymethylcellulose  Sodium Place 1 drop into both eyes 3 (three) times daily as needed (dry eyes).   triamcinolone 0.1 % Commonly known as: KENALOG Apply 1 application topically daily as needed (for itching of affected areas).       Diagnostic Studies: Nocturnal polysomnography  Result Date: 12/15/2020 Melvyn Novas, MD     12/27/2020  1:34 PM PATIENT'S NAME:  [name] DOB:      04-07-1951     MR#:    [id number]    DATE OF RECORDING: 12/15/2020 REFERRING M.D.:  Berniece Andreas, MD Study Performed:   Baseline Polysomnogram HISTORY: 11-25-20 VALARI TAYLOR is a right-handed [place] or [place] American female with a chronic Insomnia sleep disorder and a previous PSG or HST was negative for OSA. She indicates waking up and feeling like she hadn't slept at all. Insomnia worse since she moved- last year.  She fell out of bed in October, just before she moved in November (her bed is King sized), and ended up falling onto the left shoulder but suffered no fractures. Medical history is positive for morbid obesity, Anxiety, Bipolar Depression, chronic pain, MV-Prolapse, Tardive dyskinesia, HERD with hiatal hernia and "TIA" affecting the vision. The patient endorsed the Epworth Sleepiness Scale at 3 points.  The patient's weight 155 pounds with a height of 52 (inches), resulting in a BMI of 39.6 kg/m2. The patient's neck circumference measured 16 inches. CURRENT MEDICATIONS: Tylenol, Norvasc, Aspirin, Astelin, refresh, Zyrtec, Voltaren, Pepcid, ensure, flonase, cozzar, singular, centrum, Nitro-bid, Zofran, Kenalog  PROCEDURE:  This is a multichannel digital polysomnogram utilizing the Somnostar 11.2 system.  Electrodes and sensors were applied and monitored per AASM Specifications.   EEG, EOG, Chin and Limb EMG, were sampled at 200 Hz.  ECG, Snore and Nasal Pressure, Thermal Airflow, Respiratory Effort, CPAP Flow and Pressure, Oximetry was sampled at 50 Hz. Digital video and audio were recorded.     PROCEDURE:  This is a multichannel  digital polysomnogram utilizing the Somnostar 11.2 system.  Electrodes and sensors were applied and monitored per AASM Specifications.   EEG, EOG, Chin and Limb EMG, were sampled at 200 Hz.  ECG, Snore and Nasal Pressure, Thermal Airflow, Respiratory Effort, CPAP Flow and Pressure, Oximetry was sampled at 50 Hz. Digital video and audio were recorded.    BASELINE STUDY: Lights Out was at 21:13 and Lights On at  05:01.  Total recording time (TRT) was 469 minutes, with a total sleep time (TST) of 357.5 minutes.   The patient's sleep latency was 26.5 minutes.  REM latency was 116.5 minutes.  The sleep efficiency was 76.2 %.   SLEEP ARCHITECTURE: WASO (Wake after sleep onset) was 85 minutes.  There were 27 minutes in Stage N1, 164 minutes Stage N2, 95.5 minutes Stage N3 and 71 minutes in Stage REM.  The percentage of Stage N1 was 7.6%, Stage N2 was 45.9%, Stage N3 was 26.7% and Stage R (REM sleep) was 19.9%. RESPIRATORY ANALYSIS:  There were a total of 24 respiratory events:  23 obstructive apneas, 1 central apneas and 0 mixed apneas with a total of 24 apneas and an apnea index (AI) of 4. /hour. There were 0 hypopneas with a hypopnea index of 0 /hour. The patient also had 0 respiratory event related arousals (RERAs).  The total APNEA/HYPOPNEA INDEX (AHI) was 4.0/hour. 19 events occurred in REM sleep and 1 events in NREM. The REM AHI was 16.1/hour, versus a non-REM AHI of 1.0. The patient spent 0 minutes of total sleep time in the supine position and 358 minutes in non-supine. The non- supine AHI was 4.0/h. OXYGEN SATURATION & C02:  The Wake baseline 02 saturation was 88%, with the lowest being 85%. Time spent below 89% saturation equaled 4 minutes. The arousals were noted as: 20 were spontaneous, 0 were associated with PLMs, 0 were associated with respiratory events. The patient had a total of 0 Periodic Limb Movements.  Audio and video analysis did show unusual movements, behaviors, as the patient suddenly sat up in bed  and then went back to sleep, at 00.17.58 AM. Snoring was not noted. EKG was in keeping with normal sinus rhythm (NSR). IMPRESSION: 1. This PSG is negative for Obstructive Sleep Apnea (OSA) and Periodic Limb Movement Disorder (PLMD) 2. There was no Primary Snoring and no cardiac arrhythmia. 3. No clinically significant hypoxemia in sleep was noted. 4. There was one episode of sleep behavior without any vocalization- the patient sat up during REM sleep and fell back, but woke up- No seizure activity, just arousal. RECOMMENDATIONS: 1. No organic causes for Insomnia were identified. Chronic pain is a possible cause of her insomnia - please follow up with psychiatry or PCP. Sleep perception is affected, the patient feels she hasn't slept but had clearly slept for about 6 hours. I certify that I have reviewed the entire raw data recording prior to the issuance of this report in accordance with the Standards of Accreditation of the American Academy of Sleep Medicine (AASM) Melvyn Novas, MD Diplomat, American Board of Psychiatry and Neurology Diplomat, American Board of Sleep Medicine Medical Director, [facility]   DG Chest 2 View  Result Date: 12/10/2020 CLINICAL DATA:  Bronchiectasis, history COPD, diabetes mellitus, hypertension, hiatal hernia, GERD, former smoker EXAM: CHEST - 2 VIEW COMPARISON:  01/14/2020 FINDINGS: Normal heart size, mediastinal contours, and pulmonary vascularity. Lungs clear. No pulmonary infiltrate, pleural effusion, or pneumothorax. Degenerative changes at the shoulders and throughout thoracic spine. Prior cervical and lumbar fusions with levoconvex thoracic scoliosis. IMPRESSION: No acute abnormalities. Electronically Signed   By: Ulyses Southward M.D.   On: 12/10/2020 18:25   DG C-Arm 1-60 Min  Result Date: 12/17/2020 CLINICAL DATA:  Trauma. Additional history provided: Total right hip anterior hip. Provided fluoroscopy time 21 seconds (0.90 mGy). EXAM: OPERATIVE right HIP (WITH  PELVIS IF PERFORMED) 4 VIEWS TECHNIQUE: Fluoroscopic spot image(s) were submitted for  interpretation post-operatively. COMPARISON:  MRI of the right hip 04/22/2020. FINDINGS: Four intraoperative fluoroscopic images of the right hip are submitted. The images demonstrate interval right total hip arthroplasty. The femoral and acetabular components appear well seated. No unexpected finding. IMPRESSION: Four intraoperative fluoroscopic images of the right hip from right total hip arthroplasty. No unexpected finding. Electronically Signed   By: Jackey Loge DO   On: 12/17/2020 09:38   DG Hip Port Unilat With Pelvis 1V Right  Result Date: 12/17/2020 CLINICAL DATA:  Status post right hip arthroplasty. EXAM: DG HIP (WITH OR WITHOUT PELVIS) 1V PORT RIGHT COMPARISON:  12/17/2020, earlier today. FINDINGS: Interval right total hip arthroplasty. The hardware components are in anatomic alignment. No periprosthetic fracture or subluxation. Skin staples and soft tissue gas overlies the proximal right femur and hip. IMPRESSION: Status post right total hip arthroplasty.  No complications. Electronically Signed   By: Signa Kell M.D.   On: 12/17/2020 10:51   DG HIP OPERATIVE UNILAT W OR W/O PELVIS RIGHT  Result Date: 12/17/2020 CLINICAL DATA:  Trauma. Additional history provided: Total right hip anterior hip. Provided fluoroscopy time 21 seconds (0.90 mGy). EXAM: OPERATIVE right HIP (WITH PELVIS IF PERFORMED) 4 VIEWS TECHNIQUE: Fluoroscopic spot image(s) were submitted for interpretation post-operatively. COMPARISON:  MRI of the right hip 04/22/2020. FINDINGS: Four intraoperative fluoroscopic images of the right hip are submitted. The images demonstrate interval right total hip arthroplasty. The femoral and acetabular components appear well seated. No unexpected finding. IMPRESSION: Four intraoperative fluoroscopic images of the right hip from right total hip arthroplasty. No unexpected finding. Electronically Signed   By:  Jackey Loge DO   On: 12/17/2020 09:38       Follow-up Information    Eldred Manges, MD. Go on 12/31/2020.   Specialty: Orthopedic Surgery Why: at 1:30 pm for your 2 week in office appointment with Dr. Ophelia Charter. Contact information: [address] [place] [address] [phone]               Discharge Plan:  discharge to home  Disposition:     Signed: Zonia Kief  12/27/2020, 2:56 PM

## 2020-12-27 NOTE — Procedures (Signed)
PATIENT'S NAME:  Desiree [name], Desiree [name] DOB:      09-13-1951      MR#:    [id number]     DATE OF RECORDING: 12/15/2020 REFERRING M.D.:  Berniece Andreas, MD Study Performed:   Baseline Polysomnogram HISTORY: 11-25-20 SAIDY ORMAND is a right-handed [place] or [place] American female with a chronic Insomnia sleep disorder and a previous PSG or HST was negative for OSA. She indicates waking up and feeling like she hadn't slept at all. Insomnia worse since she moved- last year.  She fell out of bed in October, just before she moved in November (her bed is King sized), and ended up falling onto the left shoulder but suffered no fractures.  Medical history is positive for morbid obesity, Anxiety, Bipolar Depression, chronic pain, MV-Prolapse, Tardive dyskinesia, HERD with hiatal hernia and "TIA" affecting the vision.   The patient endorsed the Epworth Sleepiness Scale at 3 points.   The patient's weight 155 pounds with a height of 52 (inches), resulting in a BMI of 39.6 kg/m2. The patient's neck circumference measured 16 inches.  CURRENT MEDICATIONS: Tylenol, Norvasc, Aspirin, Astelin, refresh, Zyrtec, Voltaren, Pepcid, ensure, flonase, cozzar, singular, centrum, Nitro-bid, Zofran, Kenalog   PROCEDURE:  This is a multichannel digital polysomnogram utilizing the Somnostar 11.2 system.  Electrodes and sensors were applied and monitored per AASM Specifications.   EEG, EOG, Chin and Limb EMG, were sampled at 200 Hz.  ECG, Snore and Nasal Pressure, Thermal Airflow, Respiratory Effort, CPAP Flow and Pressure, Oximetry was sampled at 50 Hz. Digital video and audio were recorded.       PROCEDURE:  This is a multichannel digital polysomnogram utilizing the Somnostar 11.2 system.  Electrodes and sensors were applied and monitored per AASM Specifications.   EEG, EOG, Chin and Limb EMG, were sampled at 200 Hz.  ECG, Snore and Nasal Pressure, Thermal Airflow, Respiratory Effort, CPAP Flow and Pressure, Oximetry was sampled at 50 Hz.  Digital video and audio were recorded.      BASELINE STUDY: Lights Out was at 21:13 and Lights On at 05:01.  Total recording time (TRT) was 469 minutes, with a total sleep time (TST) of 357.5 minutes.   The patient's sleep latency was 26.5 minutes.  REM latency was 116.5 minutes.  The sleep efficiency was 76.2 %.     SLEEP ARCHITECTURE: WASO (Wake after sleep onset) was 85 minutes.  There were 27 minutes in Stage N1, 164 minutes Stage N2, 95.5 minutes Stage N3 and 71 minutes in Stage REM.  The percentage of Stage N1 was 7.6%, Stage N2 was 45.9%, Stage N3 was 26.7% and Stage R (REM sleep) was 19.9%.   RESPIRATORY ANALYSIS:  There were a total of 24 respiratory events:  23 obstructive apneas, 1 central apneas and 0 mixed apneas with a total of 24 apneas and an apnea index (AI) of 4. /hour. There were 0 hypopneas with a hypopnea index of 0 /hour. The patient also had 0 respiratory event related arousals (RERAs).  The total APNEA/HYPOPNEA INDEX (AHI) was 4.0/hour. 19 events occurred in REM sleep and 1 events in NREM. The REM AHI was 16.1/hour, versus a non-REM AHI of 1.0. The patient spent 0 minutes of total sleep time in the supine position and 358 minutes in non-supine. The non- supine AHI was 4.0/h.  OXYGEN SATURATION & C02:  The Wake baseline 02 saturation was 88%, with the lowest being 85%. Time spent below 89% saturation equaled 4 minutes. The arousals were noted as: 20  were spontaneous, 0 were associated with PLMs, 0 were associated with respiratory events. The patient had a total of 0 Periodic Limb Movements.  Audio and video analysis did show unusual movements, behaviors, as the patient suddenly sat up in bed and then went back to sleep, at 00.17.58 AM.  Snoring was not noted. EKG was in keeping with normal sinus rhythm (NSR).   IMPRESSION:  1. This PSG is negative for Obstructive Sleep Apnea (OSA) and Periodic Limb Movement Disorder (PLMD) 2. There was no Primary Snoring and no cardiac  arrhythmia.  3. No clinically significant hypoxemia in sleep was noted. 4. There was one episode of sleep behavior without any vocalization- the patient sat up during REM sleep and fell back, but woke up- No seizure activity, just arousal.    RECOMMENDATIONS:  1. No organic causes for Insomnia were identified. Chronic pain is a possible cause of her insomnia - please follow up with psychiatry or PCP. Sleep perception is affected, the patient feels she hasn't slept but had clearly slept for about 6 hours.    I certify that I have reviewed the entire raw data recording prior to the issuance of this report in accordance with the Standards of Accreditation of the American Academy of Sleep Medicine (AASM)   Melvyn Novas, MD Diplomat, American Board of Psychiatry and Neurology  Diplomat, American Board of Sleep Medicine Wellsite geologist, [place] [facility]

## 2020-12-27 NOTE — Telephone Encounter (Signed)
-----   Message from Melvyn Novas, MD sent at 12/27/2020  1:36 PM EST ----- The Wake baseline 02 saturation was 88%, with the lowest being 85%. Time spent below 89% saturation equaled 4 minutes. The arousals were noted as: 20 were spontaneous, 0 were associated with PLMs, 0 were associated with respiratory events. The patient had a total of 0 Periodic Limb Movements.  Audio and video analysis did show unusual movements, behaviors, as the patient suddenly sat up in bed and then went back to sleep, at 00.17.58 AM.  Snoring was not noted. EKG was in keeping with normal sinus rhythm (NSR).   IMPRESSION:  1. This PSG is negative for Obstructive Sleep Apnea (OSA) and Periodic Limb Movement Disorder (PLMD) 2. There was no Primary Snoring and no cardiac arrhythmia.  3. No clinically significant hypoxemia in sleep was noted. 4. There was one episode of sleep behavior without any vocalization- the patient sat up during REM sleep and fell back, but woke up- No seizure activity, just arousal.    RECOMMENDATIONS:  1. No organic causes for Insomnia were identified. Chronic pain is a possible cause of her insomnia - please follow up with psychiatry or PCP. Sleep perception is affected, the patient feels she hasn't slept but had clearly slept for about 6 hours.  No return to Sleep Medicine Clinic is indicated.

## 2020-12-28 ENCOUNTER — Telehealth: Payer: Self-pay | Admitting: Pharmacist

## 2020-12-29 NOTE — Chronic Care Management (AMB) (Signed)
Chronic Care Management Pharmacy Assistant   Name: Desiree [name]  MRN: [id number] DOB: 08/07/1951  Reason for Encounter: Medication Review   Recent office visits:   03.07.2022 Madelin Headings, MD Internal Medicine  Recent consult visits:   03.11.2022 Eldred Manges orthopedic Surgery  Hospital visits:  Medication Reconciliation was completed by comparing discharge summary, patients EMR and Pharmacy list, and upon discussion with patient.  Admitted to the hospital on 12/17/2020 due to Total right hip arthroplasty Discharge date was 12/21/2020. Discharged from [facility].    New?Medications Started at [facility] Discharge:?? -started the following medications due to surgery  Aspirin EC 325 MG tablet.  methocarbamol (ROBAXIN) 500 MG tablet  Oxycodone-acetaminophen (PERCOCET/ROXICET) 5-325 MG tablet  Medications that remain the same after Hospital Discharge:??  -All other medications will remain the same.    Medications: Outpatient Encounter Medications as of 12/28/2020  Medication Sig Note   Accu-Chek Softclix Lancets lancets Use to test blood sugar daily. Dx:e11.9    Alcohol Swabs (B-D SINGLE USE SWABS REGULAR) PADS     amLODipine (NORVASC) 5 MG tablet TAKE ONE TABLET BY MOUTH EVERY MORNING (Patient taking differently: Take 5 mg by mouth daily. TAKE ONE TABLET BY MOUTH EVERY MORNING)    aspirin EC 325 MG tablet Take 1 tablet (325 mg total) by mouth daily. Must take at least 4 weeks postop for DVT prophylaxis.    azelastine (ASTELIN) 0.1 % nasal spray Place 2 sprays into both nostrils 2 (two) times daily. Use in each nostril as directed    Blood Glucose Calibration (ACCU-CHEK GUIDE CONTROL) LIQD     Blood Pressure Monitoring (B-D ASSURE BPM/AUTO ARM CUFF) MISC Use to check blood pressure as directed.    Budeson-Glycopyrrol-Formoterol (BREZTRI AEROSPHERE) 160-9-4.8 MCG/ACT AERO Inhale 2 puffs into the lungs in the morning and at bedtime.     Carboxymethylcellulose Sodium (THERATEARS) 0.25 % SOLN Place 1 drop into both eyes 3 (three) times daily as needed (dry eyes). 12/08/2020: Dry eye Therapy    cetirizine (ZYRTEC) 10 MG tablet Take 10 mg by mouth at bedtime.     diclofenac sodium (VOLTAREN) 1 % GEL APPLY 2-4 GRAMS TO AFFECTED JOINTS UP TO FOUR TIMES DAILY (Patient taking differently: Apply 2-4 g topically 4 (four) times daily as needed (pain).)    famotidine (PEPCID) 40 MG tablet Take 1 tablet (40 mg total) by mouth daily.    feeding supplement, ENSURE ENLIVE, (ENSURE ENLIVE) LIQD Take 237 mLs by mouth 3 (three) times daily between meals.    fluticasone (FLONASE) 50 MCG/ACT nasal spray Place 2 sprays into both nostrils at bedtime.    glucose blood (ACCU-CHEK AVIVA PLUS) test strip Use to test blood sugar once daily. Dx: e11.9    linaclotide (LINZESS) 145 MCG CAPS capsule Take 145 mcg by mouth daily.    losartan (COZAAR) 25 MG tablet TAKE 1 TABLET(25 MG) BY MOUTH DAILY (Patient taking differently: Take 25 mg by mouth daily. TAKE 1 TABLET(25 MG) BY MOUTH DAILY)    methocarbamol (ROBAXIN) 500 MG tablet Take 1 tablet (500 mg total) by mouth every 6 (six) hours as needed for muscle spasms.    montelukast (SINGULAIR) 10 MG tablet TAKE 1 TABLET(10 MG) BY MOUTH AT BEDTIME (Patient taking differently: Take 10 mg by mouth at bedtime. TAKE 1 TABLET(10 MG) BY MOUTH AT BEDTIME)    Multiple Vitamins-Minerals (CENTRUM SILVER 50+WOMEN) TABS Take 1 tablet by mouth daily.    nitroGLYCERIN (NITRO-BID) 2 % ointment APPLY  1/4 INCH TOPICALLY TO AFFECTED FINGERTIPS 3 TIMES A DAY (Patient taking differently: Apply 0.25 inches topically 3 (three) times daily. APPLY 1/4 INCH TOPICALLY TO AFFECTED FINGERTIPS 3 TIMES A DAY)    ondansetron (ZOFRAN-ODT) 4 MG disintegrating tablet DISSOLVE 1 TABLET(4 MG) ON THE TONGUE EVERY 8 HOURS AS NEEDED FOR NAUSEA OR VOMITING (Patient taking differently: Take 4 mg by mouth every 8 (eight) hours as needed for nausea or  vomiting.)    oxyCODONE-acetaminophen (PERCOCET/ROXICET) 5-325 MG tablet Take 1 tablet by mouth every 6 (six) hours as needed for severe pain.    Respiratory Therapy Supplies (FLUTTER) DEVI 1 Device by Does not apply route as directed.    RESTASIS 0.05 % ophthalmic emulsion Place 1 drop into both eyes 2 (two) times daily.    triamcinolone cream (KENALOG) 0.1 % Apply 1 application topically daily as needed (for itching of affected areas).     No facility-administered encounter medications on file as of 12/28/2020.   Reviewed chart for medication changes ahead of medication coordination call.  BP Readings from Last 3 Encounters:  12/31/20 111/68  12/21/20 114/67  12/13/20 127/68    Lab Results  Component Value Date   HGBA1C 5.9 (H) 12/13/2020     Patient obtains medications through Adherence Packaging  90 Days  Last adherence delivery included:  Ondansetron 4 mg; PRN  Montelukast 10 mg; one tablet at bedtime  Famotidine 40 mg; one tablet at breakfast  Amlodipine 5 mg; one tablet at breakfast  Losartan 25 mg; one tablet at breakfast  Patient declined the following medication last month due to PRN use/additional supply on hand.  Sodium Chloride nebulizer 3%  Centrum Silver 50+ Womens; one tablet at breakfast  Ondansetron 4 mg; PRN  Cetirizine 10 mg; one tablet at Breakfast  Linzess 145 mcg; one capsule  Aspirin 81 mg; one tablet at Breakfast  Lamotrigine 25 mg; one tablet with Breakfast, one tablet with dinner (not taking)  Nitroglycerin 2% ointment; PRN  Diclofenac 1% gel; PRN  Trazodone 50 mg; PRN  Duloxetine 30 mg cap (not taking)  Accu-check SmartView test strips   Fluticasone (FLONASE) 50 MCG/ACT nasal spray: 2 sprays in nostrils at night  I spoke with the patient and review medications. There are no changes in medications currently. Patient declined these medication this month due to PRN use/additional supply on hand.The patient is taking the  following medications:  Ondansetron 4 mg; PRN  Montelukast 10 mg; one tablet at bedtime  Famotidine 40 mg; one tablet at breakfast  Amlodipine 5 mg; one tablet at breakfast  Losartan 25 mg; one tablet at breakfast  Sodium Chloride nebulizer 3%  Centrum Silver 50+ Womens; one tablet at breakfast  Ondansetron 4 mg; PRN  Cetirizine 10 mg; one tablet at Breakfast  Linzess 145 mcg; one capsule  Aspirin 81 mg; one tablet at Breakfast  Lamotrigine 25 mg; one tablet with Breakfast, one tablet with dinner (not taking)  Nitroglycerin 2% ointment; PRN  Diclofenac 1% gel; PRN  Trazodone 50 mg; PRN  Duloxetine 30 mg cap (not taking)  Accu-check SmartView test strips   Fluticasone (FLONASE) 50 MCG/ACT nasal spray: 2 sprays in nostrils at night  She currently does not need refills  Star Rating Drugs:  Dispensed Quantity Pharmacy  Losartan 25 mg 02.11.2022 [facility]   Amilia Tomma Rakers, [facility] Clinical Pharmacy Assistant [phone]

## 2020-12-31 ENCOUNTER — Telehealth: Payer: Self-pay | Admitting: *Deleted

## 2020-12-31 ENCOUNTER — Ambulatory Visit (INDEPENDENT_AMBULATORY_CARE_PROVIDER_SITE_OTHER): Payer: Medicare HMO | Admitting: Orthopaedic Surgery

## 2020-12-31 ENCOUNTER — Other Ambulatory Visit: Payer: Self-pay

## 2020-12-31 ENCOUNTER — Ambulatory Visit (INDEPENDENT_AMBULATORY_CARE_PROVIDER_SITE_OTHER): Payer: Medicare HMO

## 2020-12-31 VITALS — BP 111/68 | HR 106 | Ht 62.0 in | Wt 156.3 lb

## 2020-12-31 DIAGNOSIS — Z96641 Presence of right artificial hip joint: Secondary | ICD-10-CM

## 2020-12-31 NOTE — Telephone Encounter (Signed)
14 day in office Ortho bundle visit completed. 

## 2020-12-31 NOTE — Progress Notes (Signed)
Post-Op Visit Note   Patient: Desiree [name]           Date of Birth: July 23, 1951           MRN: [id number] Visit Date: 12/31/2020 PCP: Madelin Headings, MD   Assessment & Plan: Postop right total hip arthroplasty.  Chief Complaint:  Chief Complaint  Patient presents with  . Right Hip - Routine Post Op   Visit Diagnoses:  1. Status post right hip replacement     Plan: Staples removed incision looks good.  She is Press photographer with a walker and can work her way from the walker to a cane.  Follow-Up Instructions: No follow-ups on file.   Orders:  Orders Placed This Encounter  Procedures  . XR HIP UNILAT W OR W/O PELVIS 1V RIGHT   No orders of the defined types were placed in this encounter.   Imaging: No results found.  PMFS History: Patient Active Problem List   Diagnosis Date Noted  . Insomnia secondary to chronic pain 12/27/2020  . Insomnia due to other mental disorder 12/27/2020  . Snoring 12/27/2020  . Inadequate sleep hygiene 12/27/2020  . H/O total hip arthroplasty 12/18/2020  . Arthritis of right hip 12/17/2020  . Encounter for preoperative assessment 12/10/2020  . Infection of nail bed of finger of right hand 09/15/2020  . Pain in right finger(s) 08/31/2020  . Ganglion of right wrist 08/31/2020  . Unilateral primary osteoarthritis, right hip 04/27/2020  . COPD mixed type (HCC) 12/26/2018  . Healthcare maintenance 12/26/2018  . History of fall 11/05/2018  . Weight loss 11/05/2018  . Weakness 11/05/2018  . Gangrene of finger (HCC) 10/09/2018  . Raynaud's phenomenon with gangrene (HCC) 10/09/2018  . LVH (left ventricular hypertrophy) 10/09/2018  . Vasospasm (HCC) 09/06/2018  . Hypotension 08/08/2018  . Leucocytosis 08/08/2018  . Elevated troponin I level 08/08/2018  . Nausea and vomiting 08/06/2018  . Pneumonia 07/12/2018  . COPD exacerbation (HCC) 07/11/2018  . Primary osteoarthritis of both feet 12/21/2017  . DDD (degenerative disc disease), lumbar  12/21/2017  . Primary osteoarthritis of both knees 12/21/2017  . History of bilateral carpal tunnel release 12/21/2017  . DDD (degenerative disc disease), cervical 11/15/2017  . Former smoker 11/15/2017  . Bronchiectasis without complication (HCC) 10/25/2017  . Impingement syndrome of right shoulder 07/25/2017  . Hx of fusion of cervical spine 07/25/2017  . Cervical spinal stenosis 09/27/2016  . Polypharmacy 06/23/2015  . Hyperlipidemia 04/19/2015  . Visit for preventive health examination 01/01/2015  . Primary osteoarthritis involving multiple joints 01/01/2015  . Bipolar affective disorder, currently depressed, moderate (HCC)   . Bipolar I disorder with mania (HCC) 08/17/2014  . Hypertension 10/21/2013  . Dizziness 03/25/2013  . Medication withdrawal (HCC) 03/05/2013  . Diabetes mellitus with renal manifestations, controlled (HCC) 11/10/2012  . Alopecia areata 02/06/2012  . Obesity (BMI 30-39.9) 02/06/2012  . Renal insufficiency 07/16/2011  . 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 Exertional dyspnea 02/07/2011  . Dyspnea on exertion 01/19/2011  . DM (diabetes mellitus), type 2 (HCC) 04/22/2010  . Carpal tunnel syndrome 02/02/2010  . CONSTIPATION 02/02/2010  . Primary osteoarthritis of both hands 02/02/2010  . CATARACTS 04/13/2009  . PAIN IN JOINT, ANKLE AND FOOT 09/16/2008  . Eosinophilia 07/21/2008  . AFFECTIVE DISORDER 04/21/2008  . BACK PAIN, CHRONIC 02/12/2008  . LEG PAIN 02/12/2008  . ABNORMAL INVOLUNTARY MOVEMENTS 12/04/2007  . POSTURAL LIGHTHEADEDNESS 11/04/2007  . COLONIC POLYPS, HX OF 11/04/2007  . Essential hypertension 06/17/2007  .  HYPERLIPIDEMIA 04/08/2007  . MITRAL VALVE PROLAPSE 04/08/2007  . GERD 04/08/2007  . LOW BACK PAIN SYNDROME 04/08/2007  . CHEST PAIN, RECURRENT 04/08/2007   Past Medical History:  Diagnosis Date  . Acute gastritis without bleeding 08/08/2018  . Anemia   . Anxiety   . Asthma   . Bipolar disorder (HCC)   . CANDIDIASIS,  ESOPHAGEAL 07/28/2009   Qualifier: Diagnosis of  By: Fabian Sharp MD, Neta Mends   . Chronic kidney disease    CKD III  . Complication of anesthesia    was told she stopped breathing for one of her finger surgeries  . COPD (chronic obstructive pulmonary disease) (HCC)   . Depression   . Diabetes mellitus without complication (HCC)    no meds  . Dyspnea    At rest,and with activity  . FH: colonic polyps   . Fractured elbow    right   . GERD (gastroesophageal reflux disease)   . Headache    migraines  . HH (hiatus hernia)   . History of carpal tunnel syndrome   . History of chest pain   . History of transfusion of packed red blood cells   . Hyperlipidemia   . Hypertension   . Mitral valve prolapse   . Neuroleptic-induced tardive dyskinesia   . Osteoarthritis of more than one site   . Pneumonia   . Seasonal allergies   . Stroke [facility])     Family History  Problem Relation Age of Onset  . Heart attack Father   . Heart disease Father   . Throat cancer Brother   . Diabetes Mother   . Hypertension Mother   . Heart attack Mother   . Diabetes type II Brother   . Heart disease Brother   . Lung cancer Brother   . Breast cancer Cousin   . Lung cancer Daughter   . Lung cancer Paternal Uncle     Past Surgical History:  Procedure Laterality Date  . AMPUTATION Left 10/14/2018   Procedure: AMPUTATION LEFT LONG FINGER TIP;  Surgeon: Knute Neu, MD;  Location: [facility] OR;  Service: Plastics;  Laterality: Left;  . ANTERIOR CERVICAL DECOMP/DISCECTOMY FUSION  09/27/2016   C5-6 anterior cervical discectomy and fusion, allograft and plate/notes 14/11/3951  . ANTERIOR CERVICAL DECOMP/DISCECTOMY FUSION N/A 09/27/2016   Procedure: C5-6 Anterior Cervical Discectomy and Fusion, Allograft and Plate;  Surgeon: Eldred Manges, MD;  Location: [facility] OR;  Service: Orthopedics;  Laterality: N/A;  . Back Fusion  2002  . BIOPSY  07/19/2018   Procedure: BIOPSY;  Surgeon: Jeani Hawking, MD;  Location: [facility] ENDOSCOPY;   Service: Endoscopy;;  . BIOPSY  02/13/2019   Procedure: BIOPSY;  Surgeon: Jeani Hawking, MD;  Location: [facility] ENDOSCOPY;  Service: Endoscopy;;  . CARPAL TUNNEL RELEASE  Kegan Shepardson   left  . COLONOSCOPY N/A 01/05/2014   Procedure: COLONOSCOPY;  Surgeon: Charna Elizabeth, MD;  Location: [facility] ENDOSCOPY;  Service: Endoscopy;  Laterality: N/A;  . ELBOW SURGERY     age 25  . ENTEROSCOPY N/A 02/13/2019   Procedure: ENTEROSCOPY;  Surgeon: Jeani Hawking, MD;  Location: [facility] ENDOSCOPY;  Service: Endoscopy;  Laterality: N/A;  . ESOPHAGOGASTRODUODENOSCOPY (EGD) WITH PROPOFOL N/A 07/19/2018   Procedure: ESOPHAGOGASTRODUODENOSCOPY (EGD) WITH PROPOFOL;  Surgeon: Jeani Hawking, MD;  Location: [facility] ENDOSCOPY;  Service: Endoscopy;  Laterality: N/A;  . EXTERNAL EAR SURGERY Left   . EYE SURGERY     "removed white dots under eyelid"  . FINGER SURGERY Left   . Juvara osteomy    .  KNEE SURGERY    . NOSE SURGERY    . Rt. toe bunion    . skin, shave biopsy  05/03/2016   Left occipital scalp, top of scalp  . TONSILLECTOMY    . TOTAL HIP ARTHROPLASTY Right 12/17/2020  . TOTAL HIP ARTHROPLASTY Right 12/17/2020   Procedure: TOTAL HIP ARTHROPLASTY-DIRECT ANTERIOR;  Surgeon: Eldred Manges, MD;  Location: [facility] OR;  Service: Orthopedics;  Laterality: Right;  needs RNFA  . UPPER EXTREMITY ANGIOGRAPHY Bilateral 10/11/2018   Procedure: UPPER EXTREMITY ANGIOGRAPHY;  Surgeon: Sherren Kerns, MD;  Location: [facility] INVASIVE CV LAB;  Service: Cardiovascular;  Laterality: Bilateral;   Social History   Occupational History  . Not on file  Tobacco Use  . Smoking status: Former Smoker    Packs/day: 2.00    Years: 30.00    Pack years: 60.00    Types: Cigarettes    Quit date: 10/23/2001    Years since quitting: 19.2  . Smokeless tobacco: Never Used  Vaping Use  . Vaping Use: Never used  Substance and Sexual Activity  . Alcohol use: No  . Drug use: No  . Sexual activity: Not on file

## 2021-01-06 ENCOUNTER — Telehealth: Payer: Self-pay | Admitting: Pharmacist

## 2021-01-06 NOTE — Chronic Care Management (AMB) (Signed)
    Chronic Care Management Pharmacy Assistant   Name: Desiree [name]  MRN: [id number] DOB: 09-17-1951   Received call from patient regarding medication management via [facility].  Patient requested an acute fill for thefollowing medication 01/06/2021 to be delivered: 01/07/2021 . Ondansetron 4 mg; PRN  Pharmacy needs refills? No   Confirmed delivery date of 01/07/2021, advised patient that pharmacy will contact them the morning of delivery.  Berenice Bouton, [facility] Clinical Pharmacy Assistant [phone]

## 2021-01-07 ENCOUNTER — Telehealth: Payer: Self-pay | Admitting: Orthopaedic Surgery

## 2021-01-07 NOTE — Telephone Encounter (Signed)
Patient called asked when can she drive? The number to contact patient is [phone]

## 2021-01-10 NOTE — Telephone Encounter (Signed)
I called and lmom advising her that she can drive 6 weeks from the date of her surgery.

## 2021-01-10 NOTE — Telephone Encounter (Signed)
[place], OK to drive 6 wks after surgery date of right THA since right is gas and brake pedal foot. [place] thanks

## 2021-01-11 ENCOUNTER — Ambulatory Visit: Payer: Medicare HMO | Admitting: Pulmonary Disease

## 2021-01-14 ENCOUNTER — Telehealth: Payer: Self-pay | Admitting: Pulmonary Disease

## 2021-01-14 NOTE — Telephone Encounter (Signed)
Called [facility] at [phone], spoke with Myrene Buddy regarding prescription, states they do have the prescription and saw the note stating that it must be from the physician she is seeing and not the NP, however, that is incorrect information.  She will add the script to the information and they will have the medication shipped out.    Called patient, advised of above information, she verbalized understanding.  Nothing further needed.

## 2021-01-17 ENCOUNTER — Telehealth: Payer: Self-pay | Admitting: Orthopaedic Surgery

## 2021-01-17 ENCOUNTER — Other Ambulatory Visit: Payer: Self-pay | Admitting: Orthopaedic Surgery

## 2021-01-17 MED ORDER — TRAMADOL HCL 50 MG PO TABS
50.0000 mg | ORAL_TABLET | Freq: Three times a day (TID) | ORAL | 0 refills | Status: DC | PRN
Start: 2021-01-17 — End: 2021-01-28

## 2021-01-17 MED ORDER — METHOCARBAMOL 500 MG PO TABS: 500.0000 mg | ORAL_TABLET | Freq: Four times a day (QID) | ORAL | 0 refills | Status: DC | PRN

## 2021-01-17 NOTE — Addendum Note (Signed)
Addended by: Rogers Seeds on: 01/17/2021 01:39 PM   Modules accepted: Orders

## 2021-01-17 NOTE — Telephone Encounter (Signed)
Please advise 

## 2021-01-17 NOTE — Telephone Encounter (Signed)
Pt requesting refill on oxycodone and muscle relaxers

## 2021-01-17 NOTE — Telephone Encounter (Signed)
Pt called stating she can't take tramadol because it breaks her out; so she would like to have something else called in for pain.   [phone]

## 2021-01-17 NOTE — Telephone Encounter (Signed)
I called her and and discussed.  She had 50 Percocet.  Pain is not as bad as it was and we will wean her to Ultram.  Please call in 30 tablets 1 p.o. 3 times daily as needed pain and you can renew the Robaxin.  Thank you

## 2021-01-17 NOTE — Telephone Encounter (Signed)
Tramadol called to pharmacy. Robaxin refill sent to pharmacy.

## 2021-01-18 ENCOUNTER — Telehealth: Payer: Self-pay | Admitting: *Deleted

## 2021-01-18 ENCOUNTER — Telehealth: Payer: Self-pay

## 2021-01-18 MED ORDER — ACETAMINOPHEN-CODEINE #3 300-30 MG PO TABS: 1.0000 | ORAL_TABLET | Freq: Three times a day (TID) | ORAL | 0 refills | Status: DC | PRN

## 2021-01-18 NOTE — Telephone Encounter (Signed)
Call in tylenol # 3 thanks same sig .

## 2021-01-18 NOTE — Telephone Encounter (Signed)
Please advise 

## 2021-01-18 NOTE — Telephone Encounter (Signed)
I left voicemail for patient advising. 

## 2021-01-18 NOTE — Telephone Encounter (Signed)
error 

## 2021-01-18 NOTE — Telephone Encounter (Signed)
Pt called stating the medication is still unavailable for pick up.

## 2021-01-18 NOTE — Telephone Encounter (Signed)
Tylenol #3 called to pharmacy.

## 2021-01-18 NOTE — Telephone Encounter (Signed)
Ortho bundle 30 day call completed; Patient satisfaction survey completed as well.

## 2021-01-19 ENCOUNTER — Telehealth: Payer: Self-pay

## 2021-01-19 ENCOUNTER — Telehealth: Payer: Self-pay | Admitting: Orthopaedic Surgery

## 2021-01-19 NOTE — Telephone Encounter (Signed)
Patient called regarding last message about rx she stated the pharmacy closes at 6  call back:[phone]

## 2021-01-19 NOTE — Telephone Encounter (Signed)
Patient aware this was called in for her again

## 2021-01-19 NOTE — Telephone Encounter (Signed)
Received call from [facility] with [facility]  Advised Rx for Tylenol 3 was not received. Misty asked if it can please be callled in again? The number to contact Desiree [name] is [phone]

## 2021-01-20 NOTE — Telephone Encounter (Signed)
Was this taken care of yesterday?

## 2021-01-20 NOTE — Telephone Encounter (Signed)
Yes ma'am! 
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
## 2021-01-24 ENCOUNTER — Telehealth: Payer: Self-pay | Admitting: Pharmacist

## 2021-01-24 NOTE — Chronic Care Management (AMB) (Signed)
Chronic Care Management Pharmacy Assistant   Name: Desiree [name]  MRN: [id number] DOB: 1951/10/09  Reason for Encounter: Medication Review-Medication Coordination Call   Recent office visits:  None  Recent consult visits:  None  Hospital visits:  None in previous 6 months  Medications: Outpatient Encounter Medications as of 01/24/2021  Medication Sig Note  . Accu-Chek Softclix Lancets lancets Use to test blood sugar daily. Dx:e11.9   . acetaminophen-codeine (TYLENOL #3) 300-30 MG tablet Take 1 tablet by mouth 3 (three) times daily as needed for moderate pain.   . Alcohol Swabs (B-D SINGLE USE SWABS REGULAR) PADS    . amLODipine (NORVASC) 5 MG tablet TAKE ONE TABLET BY MOUTH EVERY MORNING (Patient taking differently: Take 5 mg by mouth daily. TAKE ONE TABLET BY MOUTH EVERY MORNING)   . aspirin EC 325 MG tablet Take 1 tablet (325 mg total) by mouth daily. Must take at least 4 weeks postop for DVT prophylaxis.   Marland Kitchen azelastine (ASTELIN) 0.1 % nasal spray Place 2 sprays into both nostrils 2 (two) times daily. Use in each nostril as directed   . Blood Glucose Calibration (ACCU-CHEK GUIDE CONTROL) LIQD    . Blood Pressure Monitoring (B-D ASSURE BPM/AUTO ARM CUFF) MISC Use to check blood pressure as directed.   . Budeson-Glycopyrrol-Formoterol (BREZTRI AEROSPHERE) 160-9-4.8 MCG/ACT AERO Inhale 2 puffs into the lungs in the morning and at bedtime.   . Carboxymethylcellulose Sodium (THERATEARS) 0.25 % SOLN Place 1 drop into both eyes 3 (three) times daily as needed (dry eyes). 12/08/2020: Dry eye Therapy   . cetirizine (ZYRTEC) 10 MG tablet Take 10 mg by mouth at bedtime.    . diclofenac sodium (VOLTAREN) 1 % GEL APPLY 2-4 GRAMS TO AFFECTED JOINTS UP TO FOUR TIMES DAILY (Patient taking differently: Apply 2-4 g topically 4 (four) times daily as needed (pain).)   . famotidine (PEPCID) 40 MG tablet Take 1 tablet (40 mg total) by mouth daily.   . feeding supplement, ENSURE ENLIVE, (ENSURE ENLIVE)  LIQD Take 237 mLs by mouth 3 (three) times daily between meals.   . fluticasone (FLONASE) 50 MCG/ACT nasal spray Place 2 sprays into both nostrils at bedtime.   Marland Kitchen glucose blood (ACCU-CHEK AVIVA PLUS) test strip Use to test blood sugar once daily. Dx: e11.9   . linaclotide (LINZESS) 145 MCG CAPS capsule Take 145 mcg by mouth daily.   Marland Kitchen losartan (COZAAR) 25 MG tablet TAKE 1 TABLET(25 MG) BY MOUTH DAILY (Patient taking differently: Take 25 mg by mouth daily. TAKE 1 TABLET(25 MG) BY MOUTH DAILY)   . methocarbamol (ROBAXIN) 500 MG tablet Take 1 tablet (500 mg total) by mouth every 6 (six) hours as needed for muscle spasms.   . montelukast (SINGULAIR) 10 MG tablet TAKE 1 TABLET(10 MG) BY MOUTH AT BEDTIME (Patient taking differently: Take 10 mg by mouth at bedtime. TAKE 1 TABLET(10 MG) BY MOUTH AT BEDTIME)   . Multiple Vitamins-Minerals (CENTRUM SILVER 50+WOMEN) TABS Take 1 tablet by mouth daily.   . nitroGLYCERIN (NITRO-BID) 2 % ointment APPLY 1/4 INCH TOPICALLY TO AFFECTED FINGERTIPS 3 TIMES A DAY (Patient taking differently: Apply 0.25 inches topically 3 (three) times daily. APPLY 1/4 INCH TOPICALLY TO AFFECTED FINGERTIPS 3 TIMES A DAY)   . ondansetron (ZOFRAN-ODT) 4 MG disintegrating tablet DISSOLVE 1 TABLET(4 MG) ON THE TONGUE EVERY 8 HOURS AS NEEDED FOR NAUSEA OR VOMITING (Patient taking differently: Take 4 mg by mouth every 8 (eight) hours as needed for nausea or vomiting.)   .  oxyCODONE-acetaminophen (PERCOCET/ROXICET) 5-325 MG tablet Take 1 tablet by mouth every 6 (six) hours as needed for severe pain.   Marland Kitchen Respiratory Therapy Supplies (FLUTTER) DEVI 1 Device by Does not apply route as directed.   . RESTASIS 0.05 % ophthalmic emulsion Place 1 drop into both eyes 2 (two) times daily.   . traMADol (ULTRAM) 50 MG tablet Take 1 tablet (50 mg total) by mouth 3 (three) times daily as needed.   . triamcinolone cream (KENALOG) 0.1 % Apply 1 application topically daily as needed (for itching of affected  areas).     No facility-administered encounter medications on file as of 01/24/2021.   Reviewed chart for medication changes ahead of medication coordination call.  BP Readings from Last 3 Encounters:  12/31/20 111/68  12/21/20 114/67  12/13/20 127/68    Lab Results  Component Value Date   HGBA1C 5.9 (H) 12/13/2020     Patient obtains medications through Adherence Packaging  90 Days  Last adherence delivery included:  Marland Kitchen Montelukast 10 mg; one tablet at bedtime . Famotidine 40 mg; one tablet at breakfast . Amlodipine 5 mg; one tablet at breakfast . Linzess 145 mcg; one capsule . Fluticasone (FLONASE) 50 MCG/ACT nasal spray: 2 sprays in nostrils at night  Patient declined the following medication last month due to PRN use/additional supply on hand. . Ondansetron 4 mg; PRN . Montelukast 10 mg; one tablet at bedtime . Famotidine 40 mg; one tablet at breakfast . Amlodipine 5 mg; one tablet at breakfast . Losartan 25 mg; one tablet at breakfast . Sodium Chloride nebulizer 3% . Centrum Silver 50+ Women's; one tablet at breakfast . Ondansetron 4 mg; PRN . Cetirizine 10 mg; one tablet at Breakfast . Linzess 145 mcg; one capsule . Aspirin 81 mg; one tablet at Breakfast . Nitroglycerin 2% ointment; PRN . Trazodone 50 mg; PRN . Accu-check SmartView test strips  . Fluticasone (FLONASE) 50 MCG/ACT nasal spray: 2 sprays in nostrils at night . Azelastine HCl 0.1 % 2 sprays Each Nare 2 times daily, use in each nostril as directed . Budeson-Glycopyrrol-Formoterol 160-9-4.8 MCG/ACT 2 puffs Inhalation 2 times daily . Carboxymethylcellulose Sodium 0.25 % 1 drop Both Eyes 3 times daily PRN  I spoke with the patient and review medications. There are no changes in medications currently. Patient declined these medication this month due to PRN use/additional supply on hand.The patient is taking the following medications: 90 days supply sent out on 12/03/2020. . Ondansetron 4 mg; PRN . Montelukast  10 mg; one tablet at bedtime . Famotidine 40 mg; one tablet at breakfast . Amlodipine 5 mg; one tablet at breakfast . Losartan 25 mg; one tablet at breakfast . Sodium Chloride nebulizer 3% . Centrum Silver 50+ Women's; one tablet at breakfast . Ondansetron 4 mg; PRN . Cetirizine 10 mg; one tablet at Breakfast . Linzess 145 mcg; one capsule . Aspirin 81 mg; one tablet at Breakfast . Nitroglycerin 2% ointment; PRN . Trazodone 50 mg; PRN . Accu-check SmartView test strips  . Fluticasone (FLONASE) 50 MCG/ACT nasal spray: 2 sprays in nostrils at night . Azelastine HCl 0.1 % 2 sprays Each Nare 2 times daily, use in each nostril as directed . Budeson-Glycopyrrol-Formoterol 160-9-4.8 MCG/ACT 2 puffs Inhalation 2 times daily . Carboxymethylcellulose Sodium 0.25 % 1 drop Both Eyes 3 times daily PRN  She currently does not need refills  Star Rating Drugs:  Dispensed Quantity Pharmacy  Losartan 25 mg 02.11.2022 [address] [facility]   Amilia Tomma Rakers, [facility] Concierge [phone]

## 2021-01-26 ENCOUNTER — Encounter: Payer: Medicare HMO | Admitting: Internal Medicine

## 2021-01-27 ENCOUNTER — Ambulatory Visit: Payer: Medicare HMO | Admitting: Pulmonary Disease

## 2021-01-28 ENCOUNTER — Ambulatory Visit: Payer: Medicare HMO | Admitting: Cardiovascular Disease

## 2021-01-28 ENCOUNTER — Encounter: Payer: Self-pay | Admitting: Cardiovascular Disease

## 2021-01-28 ENCOUNTER — Other Ambulatory Visit: Payer: Self-pay

## 2021-01-28 ENCOUNTER — Telehealth: Payer: Self-pay | Admitting: Internal Medicine

## 2021-01-28 VITALS — BP 100/52 | HR 100 | Ht 62.0 in | Wt 162.0 lb

## 2021-01-28 DIAGNOSIS — I7301 Raynaud's syndrome with gangrene: Secondary | ICD-10-CM | POA: Diagnosis not present

## 2021-01-28 DIAGNOSIS — R079 Chest pain, unspecified: Secondary | ICD-10-CM

## 2021-01-28 DIAGNOSIS — I10 Essential (primary) hypertension: Secondary | ICD-10-CM | POA: Diagnosis not present

## 2021-01-28 NOTE — Telephone Encounter (Signed)
Patient is calling and wanted to see if the provider recommended her to take the 4th booster shot, please advise. CB is [phone]

## 2021-01-28 NOTE — Patient Instructions (Signed)

## 2021-01-31 NOTE — Telephone Encounter (Signed)
Because of her risk factors I favor her getting the fourth shot if it has been 5-6 months since her last injection.

## 2021-01-31 NOTE — Telephone Encounter (Signed)
Patient informed of the message below.

## 2021-02-02 ENCOUNTER — Ambulatory Visit: Payer: Self-pay | Attending: Internal Medicine

## 2021-02-02 ENCOUNTER — Other Ambulatory Visit (HOSPITAL_BASED_OUTPATIENT_CLINIC_OR_DEPARTMENT_OTHER): Payer: Self-pay

## 2021-02-02 ENCOUNTER — Other Ambulatory Visit: Payer: Self-pay

## 2021-02-02 DIAGNOSIS — Z23 Encounter for immunization: Secondary | ICD-10-CM

## 2021-02-02 NOTE — Progress Notes (Signed)
   Covid-19 Vaccination Clinic  Name:  Desiree [name]    MRN: [id number] DOB: 12-Jan-1951  02/02/2021  [name] was observed post Covid-19 immunization for 15 minutes without incident. She was provided with Vaccine Information Sheet and instruction to access the V-Safe system.   [name] was instructed to call 911 with any severe reactions post vaccine: Marland Kitchen Difficulty breathing  . Swelling of face and throat  . A fast heartbeat  . A bad rash all over body  . Dizziness and weakness   Immunizations Administered    Name Date Dose VIS Date Route   PFIZER Comrnaty(Gray TOP) Covid-19 Vaccine 02/02/2021  2:17 PM 0.3 mL 09/30/2020 Intramuscular   Manufacturer: [employer], [employer]   Lot: [id number]   NDC: [phone]
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
## 2021-02-07 ENCOUNTER — Telehealth: Payer: Self-pay

## 2021-02-07 NOTE — Telephone Encounter (Signed)
Patient left a voicemail (Friday, 02/04/21 at 11:05 am) stating she is experiencing cramping in her bilateral hands and fingers, as well as her feet.  Patient states the cramping in her hands is worse than her feet.  Patient requested a return call to discuss what could be causing the cramping and if there is anything she can do.

## 2021-02-07 NOTE — Telephone Encounter (Signed)
I called patient, patient is not having pain except during cramping, no swelling

## 2021-02-07 NOTE — Telephone Encounter (Signed)
Cramping in the hands and feet are very common.  Please clarify if she is having any joint pain or joint swelling.

## 2021-02-08 ENCOUNTER — Other Ambulatory Visit (HOSPITAL_BASED_OUTPATIENT_CLINIC_OR_DEPARTMENT_OTHER): Payer: Self-pay

## 2021-02-08 MED ORDER — COVID-19 MRNA VAC-TRIS(PFIZER) 30 MCG/0.3ML IM SUSP
INTRAMUSCULAR | 0 refills | Status: DC
  Filled 2021-02-08: qty 0.3, 1d supply, fill #0

## 2021-02-09 ENCOUNTER — Other Ambulatory Visit: Payer: Self-pay | Admitting: Internal Medicine

## 2021-02-09 DIAGNOSIS — N183 Chronic kidney disease, stage 3 unspecified: Secondary | ICD-10-CM | POA: Diagnosis not present

## 2021-02-09 LAB — COMPREHENSIVE METABOLIC PANEL
Albumin: 4.5 (ref 3.5–5.0)
Calcium: 10.2 (ref 8.7–10.7)
GFR calc Af Amer: 43

## 2021-02-09 LAB — BASIC METABOLIC PANEL
CO2: 26 — AB (ref 13–22)
Chloride: 102 (ref 99–108)
Creatinine: 1.4 — AB (ref <=1.1)
Creatinine: 1.4 — AB (ref <=1.1)
Glucose: 87
Potassium: 4.7 (ref 3.4–5.3)
Sodium: 140 (ref 137–147)

## 2021-02-09 LAB — CBC AND DIFFERENTIAL
HCT: 38 (ref 36–46)
Hemoglobin: 12.4 (ref 12.0–16.0)

## 2021-02-10 ENCOUNTER — Telehealth: Payer: Self-pay | Admitting: Internal Medicine

## 2021-02-10 ENCOUNTER — Other Ambulatory Visit: Payer: Self-pay

## 2021-02-10 NOTE — Telephone Encounter (Signed)
Left message for patient to call back and schedule Medicare Annual Wellness Visit (AWV) either virtually or in office.   Last AWV 07/05/17  please schedule at anytime with [facility] Nurse Health Advisor 1 or 2   This should be a 45 minute visit.  

## 2021-02-13 NOTE — Telephone Encounter (Signed)
Ok to  [employer]

## 2021-02-16 DIAGNOSIS — E1122 Type 2 diabetes mellitus with diabetic chronic kidney disease: Secondary | ICD-10-CM | POA: Diagnosis not present

## 2021-02-16 DIAGNOSIS — I129 Hypertensive chronic kidney disease with stage 1 through stage 4 chronic kidney disease, or unspecified chronic kidney disease: Secondary | ICD-10-CM | POA: Diagnosis not present

## 2021-02-16 DIAGNOSIS — N183 Chronic kidney disease, stage 3 unspecified: Secondary | ICD-10-CM | POA: Diagnosis not present

## 2021-02-16 DIAGNOSIS — D631 Anemia in chronic kidney disease: Secondary | ICD-10-CM | POA: Diagnosis not present

## 2021-02-16 DIAGNOSIS — N2581 Secondary hyperparathyroidism of renal origin: Secondary | ICD-10-CM | POA: Diagnosis not present

## 2021-02-17 ENCOUNTER — Ambulatory Visit: Payer: Medicare HMO | Admitting: Physician Assistant

## 2021-02-17 ENCOUNTER — Other Ambulatory Visit (INDEPENDENT_AMBULATORY_CARE_PROVIDER_SITE_OTHER): Payer: Self-pay | Admitting: Otolaryngology

## 2021-02-17 DIAGNOSIS — M5136 Other intervertebral disc degeneration, lumbar region: Secondary | ICD-10-CM

## 2021-02-17 DIAGNOSIS — J479 Bronchiectasis, uncomplicated: Secondary | ICD-10-CM

## 2021-02-17 DIAGNOSIS — R768 Other specified abnormal immunological findings in serum: Secondary | ICD-10-CM

## 2021-02-17 DIAGNOSIS — Z8639 Personal history of other endocrine, nutritional and metabolic disease: Secondary | ICD-10-CM

## 2021-02-17 DIAGNOSIS — M5030 Other cervical disc degeneration, unspecified cervical region: Secondary | ICD-10-CM

## 2021-02-17 DIAGNOSIS — Z8659 Personal history of other mental and behavioral disorders: Secondary | ICD-10-CM

## 2021-02-17 DIAGNOSIS — M19072 Primary osteoarthritis, left ankle and foot: Secondary | ICD-10-CM

## 2021-02-17 DIAGNOSIS — Z79899 Other long term (current) drug therapy: Secondary | ICD-10-CM

## 2021-02-17 DIAGNOSIS — I10 Essential (primary) hypertension: Secondary | ICD-10-CM

## 2021-02-17 DIAGNOSIS — E782 Mixed hyperlipidemia: Secondary | ICD-10-CM

## 2021-02-17 DIAGNOSIS — M170 Bilateral primary osteoarthritis of knee: Secondary | ICD-10-CM

## 2021-02-17 DIAGNOSIS — I7301 Raynaud's syndrome with gangrene: Secondary | ICD-10-CM

## 2021-02-17 DIAGNOSIS — M7541 Impingement syndrome of right shoulder: Secondary | ICD-10-CM

## 2021-02-17 DIAGNOSIS — M19041 Primary osteoarthritis, right hand: Secondary | ICD-10-CM

## 2021-02-17 DIAGNOSIS — Z87891 Personal history of nicotine dependence: Secondary | ICD-10-CM

## 2021-02-17 DIAGNOSIS — M1611 Unilateral primary osteoarthritis, right hip: Secondary | ICD-10-CM

## 2021-02-17 DIAGNOSIS — M19071 Primary osteoarthritis, right ankle and foot: Secondary | ICD-10-CM

## 2021-02-18 ENCOUNTER — Telehealth: Payer: Self-pay

## 2021-02-18 ENCOUNTER — Telehealth: Payer: Self-pay | Admitting: Pharmacist

## 2021-02-18 ENCOUNTER — Encounter: Payer: Self-pay | Admitting: Pulmonary Disease

## 2021-02-18 ENCOUNTER — Other Ambulatory Visit: Payer: Self-pay

## 2021-02-18 ENCOUNTER — Ambulatory Visit: Payer: Medicare HMO | Admitting: Pulmonary Disease

## 2021-02-18 VITALS — BP 110/70 | HR 100 | Temp 98.0°F | Ht 62.0 in | Wt 158.0 lb

## 2021-02-18 DIAGNOSIS — J479 Bronchiectasis, uncomplicated: Secondary | ICD-10-CM | POA: Diagnosis not present

## 2021-02-18 DIAGNOSIS — J449 Chronic obstructive pulmonary disease, unspecified: Secondary | ICD-10-CM | POA: Diagnosis not present

## 2021-02-18 MED ORDER — AZELASTINE HCL 0.1 % NA SOLN: 2.0000 | Freq: Two times a day (BID) | NASAL | 12 refills | Status: DC
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
## 2021-02-18 NOTE — Patient Instructions (Addendum)
Nice to see you!  I refilled the azelastine nasal spray  Continue the Breztri - 2 puffs twice a day - I am glad this has helped. I am happy to provide refills.   Return for follow up in 1 year with Dr. Judeth Horn

## 2021-02-18 NOTE — Chronic Care Management (AMB) (Signed)
    Chronic Care Management Pharmacy Assistant   Name: TAMARRA GEISELMAN  MRN: [id number] DOB: 1950-11-10  Left voicemail reminding patient of her upcoming telephone appointment with [name] the clinical pharmacist. Informed patient to have all medications close by and any blood pressure or blood glucose readings close by.   Joycelyn Das CMA  [facility] [phone]

## 2021-02-18 NOTE — Chronic Care Management (AMB) (Signed)
    Chronic Care Management Pharmacy Assistant   Name: Desiree [name]  MRN: [id number] DOB: 10/10/1951  Patient called back to reschedule her upcoming appointment with Sheran Lawless pryor clinical pharmacist. Message sent to [place] perdue to reschedule to reschedule to 03/30/21 at 9:45am by telephone.   Joycelyn Das CMA  [facility] [phone]

## 2021-02-21 ENCOUNTER — Other Ambulatory Visit: Payer: Self-pay | Admitting: Internal Medicine

## 2021-02-21 ENCOUNTER — Telehealth: Payer: Medicare HMO

## 2021-02-21 DIAGNOSIS — Z1231 Encounter for screening mammogram for malignant neoplasm of breast: Secondary | ICD-10-CM | POA: Diagnosis not present

## 2021-02-21 DIAGNOSIS — Z01419 Encounter for gynecological examination (general) (routine) without abnormal findings: Secondary | ICD-10-CM | POA: Diagnosis not present

## 2021-02-21 DIAGNOSIS — Z6828 Body mass index (BMI) 28.0-28.9, adult: Secondary | ICD-10-CM | POA: Diagnosis not present

## 2021-02-22 ENCOUNTER — Telehealth: Payer: Self-pay | Admitting: Pharmacist

## 2021-02-22 ENCOUNTER — Encounter: Payer: Self-pay | Admitting: Internal Medicine

## 2021-02-23 NOTE — Chronic Care Management (AMB) (Signed)
Error

## 2021-02-25 ENCOUNTER — Telehealth: Payer: Self-pay | Admitting: Pharmacist

## 2021-02-25 ENCOUNTER — Ambulatory Visit: Payer: Self-pay

## 2021-02-25 NOTE — Chronic Care Management (AMB) (Addendum)
Chronic Care Management Pharmacy Assistant   Name: Desiree [name]  MRN: [id number] DOB: 03-20-1951  Reason for Encounter: Medication Review/ Medication Coordination Call.    Recent office visits:  02/18/21 Vilma Meckel MD (Pulmonary Disease) - presented to clinic for COPD. Removed COVID-19 vaccines from list. Follow up in 12 months.   Recent consult visits:  None.   Hospital visits:  None in previous 6 months  Medications: Outpatient Encounter Medications as of 02/25/2021  Medication Sig Note  . Accu-Chek Softclix Lancets lancets Use to test blood sugar daily. Dx:e11.9   . acetaminophen-codeine (TYLENOL #3) 300-30 MG tablet Take 1 tablet by mouth 3 (three) times daily as needed for moderate pain.   . Alcohol Swabs (B-D SINGLE USE SWABS REGULAR) PADS USE AS DIRECTED   . amLODipine (NORVASC) 5 MG tablet TAKE ONE TABLET BY MOUTH EVERY MORNING (Patient taking differently: Take 5 mg by mouth daily. TAKE ONE TABLET BY MOUTH EVERY MORNING)   . aspirin EC 325 MG tablet Take 1 tablet (325 mg total) by mouth daily. Must take at least 4 weeks postop for DVT prophylaxis. (Patient not taking: Reported on 02/18/2021)   . azelastine (ASTELIN) 0.1 % nasal spray Place 2 sprays into both nostrils 2 (two) times daily. Use in each nostril as directed   . Budeson-Glycopyrrol-Formoterol (BREZTRI AEROSPHERE) 160-9-4.8 MCG/ACT AERO Inhale 2 puffs into the lungs in the morning and at bedtime.   . Carboxymethylcellulose Sodium (THERATEARS) 0.25 % SOLN Place 1 drop into both eyes 3 (three) times daily as needed (dry eyes). 12/08/2020: Dry eye Therapy   . cetirizine (ZYRTEC) 10 MG tablet Take 10 mg by mouth at bedtime.    . diclofenac sodium (VOLTAREN) 1 % GEL APPLY 2-4 GRAMS TO AFFECTED JOINTS UP TO FOUR TIMES DAILY (Patient taking differently: Apply 2-4 g topically 4 (four) times daily as needed (pain).)   . famotidine (PEPCID) 40 MG tablet Take 1 tablet (40 mg total) by mouth daily.   . feeding supplement,  ENSURE ENLIVE, (ENSURE ENLIVE) LIQD Take 237 mLs by mouth 3 (three) times daily between meals.   . fluticasone (FLONASE) 50 MCG/ACT nasal spray Place 2 sprays into both nostrils at bedtime.   Marland Kitchen linaclotide (LINZESS) 145 MCG CAPS capsule Take 145 mcg by mouth daily.   Marland Kitchen losartan (COZAAR) 25 MG tablet TAKE ONE TABLET BY MOUTH EVERY MORNING   . methocarbamol (ROBAXIN) 500 MG tablet Take 1 tablet (500 mg total) by mouth every 6 (six) hours as needed for muscle spasms.   . montelukast (SINGULAIR) 10 MG tablet TAKE 1 TABLET(10 MG) BY MOUTH AT BEDTIME (Patient taking differently: Take 10 mg by mouth at bedtime. TAKE 1 TABLET(10 MG) BY MOUTH AT BEDTIME)   . Multiple Vitamins-Minerals (CENTRUM SILVER 50+WOMEN) TABS Take 1 tablet by mouth daily.   . nitroGLYCERIN (NITRO-BID) 2 % ointment APPLY 1/4 INCH TOPICALLY TO AFFECTED FINGERTIPS 3 TIMES A DAY (Patient taking differently: Apply 0.25 inches topically 3 (three) times daily. APPLY 1/4 INCH TOPICALLY TO AFFECTED FINGERTIPS 3 TIMES A DAY)   . ondansetron (ZOFRAN-ODT) 4 MG disintegrating tablet DISSOLVE 1 TABLET(4 MG) ON THE TONGUE EVERY 8 HOURS AS NEEDED FOR NAUSEA OR VOMITING (Patient taking differently: Take 4 mg by mouth every 8 (eight) hours as needed for nausea or vomiting.)   . Respiratory Therapy Supplies (FLUTTER) DEVI 1 Device by Does not apply route as directed.   . RESTASIS 0.05 % ophthalmic emulsion Place 1 drop into both eyes 2 (two) times  daily.   . triamcinolone cream (KENALOG) 0.1 % Apply 1 application topically daily as needed (for itching of affected areas).     No facility-administered encounter medications on file as of 02/25/2021.    Reviewed chart for medication changes ahead of medication coordination call.  No OVs, Consults, or hospital visits since last care coordination call/Pharmacist visit. (If appropriate, list visit date, provider name)  No medication changes indicated OR if recent visit, treatment plan here.  BP Readings from  Last 3 Encounters:  02/18/21 110/70  01/28/21 (!) 100/52  12/31/20 111/68    Lab Results  Component Value Date   HGBA1C 5.9 (H) 12/13/2020     Patient obtains medications through [facility]  90 Days   Last adherence delivery included: None. Due to PRN and additional supply on hand per last note.  Patient declined (meds) last month due to PRN use/additional supply on hand. Explanation of abundance on hand. Patient had received 90 day supply on 12/03/20.   Patient is due for next adherence delivery on: N/A. Called patient and reviewed medications and coordinated delivery.  This delivery to include:  Amlodipine 5 mg; one tablet at breakfast  Famotidine 40 mg; one tablet at breakfast  Ondansetron 4 mg; PRN  Cetirizine 10 mg; one tablet at Breakfast  Losartan 25 mg; one tablet at breakfast  Azelastine HCl 0.1 % 2 sprays Each Nare 2 times daily, use in each nostril as directed   Patient declined the following medications due to cost of linzess which patient states she will call when she can get that next month. She gets flonase from [employer]. Patient does not take Breztri inhaler at this time and patient has enough of everything else listed.    Sodium Chloride nebulizer 3%  Centrum Silver 50+ Women's; one tablet at breakfast  Aspirin 81 mg; one tablet at Breakfast  Nitroglycerin 2% ointment; PRN  Trazodone 50 mg; PRN  Accu-check SmartView test strips      Budeson-Glycopyrrol-Formoterol 160-9-4.8 MCG/ACT 2 puffs Inhalation 2 times daily  Carboxymethylcellulose Sodium 0.25 % 1 drop Both Eyes 3 times daily PRN  Montelukast 10mg  - take 1 daily.   Fluticasone (FLONASE) 50 MCG/ACT nasal spray: 2 sprays in nostrils at night  Linzess 145 mcg; one capsule    Confirmed delivery date of N/A, advised patient that pharmacy will contact them the morning of delivery.   Multiple unsuccessful attempts to reach patient by phone to complete.   03/17/21 reached back  out to patient per [facility] and spoke with patient about medication delivery.  Patient needs refills on famotidine and ondansetron. Message sent to [facility]. I ask patient about patient assistance on Linzess and she states she has filled out before and never heard back. Patient thanked me for my call. I informed patient we will get delivery out once we know price and as soon as we can. Kamara aware of patient requests in pharmacy chat.   Star Rating Drugs:    Losartan 25 mg - last filled on 12/03/20 [facility].   01/31/21 CMA  [facility] [phone]

## 2021-02-28 ENCOUNTER — Other Ambulatory Visit: Payer: Self-pay | Admitting: Internal Medicine

## 2021-02-28 NOTE — Telephone Encounter (Cosign Needed)
2 attempts. 

## 2021-03-01 ENCOUNTER — Ambulatory Visit (INDEPENDENT_AMBULATORY_CARE_PROVIDER_SITE_OTHER): Payer: Medicare HMO

## 2021-03-01 DIAGNOSIS — Z0000 Encounter for general adult medical examination without abnormal findings: Secondary | ICD-10-CM

## 2021-03-01 DIAGNOSIS — Z780 Asymptomatic menopausal state: Secondary | ICD-10-CM | POA: Diagnosis not present

## 2021-03-01 DIAGNOSIS — Z1231 Encounter for screening mammogram for malignant neoplasm of breast: Secondary | ICD-10-CM | POA: Diagnosis not present
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
## 2021-03-01 NOTE — Patient Instructions (Signed)
[name] , Thank you for taking time to come for your Medicare Wellness Visit. I appreciate your ongoing commitment to your health goals. Please review the following plan we discussed and let me know if I can assist you in the future.   Screening recommendations/referrals: Colonoscopy: current  Due 01/06/2024 Mammogram: current due 02/21/2022 Bone Density: scheduled 04/13/2021 Recommended yearly ophthalmology/optometry visit for glaucoma screening and checkup Recommended yearly dental visit for hygiene and checkup  Vaccinations: Influenza vaccine: current due fall 2022  Pneumococcal vaccine: completed series  Tdap vaccine: current due 05/05/2026 Shingles vaccine: declined   Advanced directives: will provide copies   Conditions/risks identified: none   Next appointment: 04/27/2021  Dr.[name] 1000am    Preventive Care 69 Years and Older, Female Preventive care refers to lifestyle choices and visits with your health care provider that can promote health and wellness. What does preventive care include?  A yearly physical exam. This is also called an annual well check.  Dental exams once or twice a year.  Routine eye exams. Ask your health care provider how often you should have your eyes checked.  Personal lifestyle choices, including:  Daily care of your teeth and gums.  Regular physical activity.  Eating a healthy diet.  Avoiding tobacco and drug use.  Limiting alcohol use.  Practicing safe sex.  Taking low-dose aspirin every day.  Taking vitamin and mineral supplements as recommended by your health care provider. What happens during an annual well check? The services and screenings done by your health care provider during your annual well check will depend on your age, overall health, lifestyle risk factors, and family history of disease. Counseling  Your health care provider may ask you questions about your:  Alcohol use.  Tobacco use.  Drug  use.  Emotional well-being.  Home and relationship well-being.  Sexual activity.  Eating habits.  History of falls.  Memory and ability to understand (cognition).  Work and work Astronomer.  Reproductive health. Screening  You may have the following tests or measurements:  Height, weight, and BMI.  Blood pressure.  Lipid and cholesterol levels. These may be checked every 5 years, or more frequently if you are over 80 years old.  Skin check.  Lung cancer screening. You may have this screening every year starting at age 49 if you have a 30-pack-year history of smoking and currently smoke or have quit within the past 15 years.  Fecal occult blood test (FOBT) of the stool. You may have this test every year starting at age 68.  Flexible sigmoidoscopy or colonoscopy. You may have a sigmoidoscopy every 5 years or a colonoscopy every 10 years starting at age 64.  Hepatitis C blood test.  Hepatitis B blood test.  Sexually transmitted disease (STD) testing.  Diabetes screening. This is done by checking your blood sugar (glucose) after you have not eaten for a while (fasting). You may have this done every 1-3 years.  Bone density scan. This is done to screen for osteoporosis. You may have this done starting at age 20.  Mammogram. This may be done every 1-2 years. Talk to your health care provider about how often you should have regular mammograms. Talk with your health care provider about your test results, treatment options, and if necessary, the need for more tests. Vaccines  Your health care provider may recommend certain vaccines, such as:  Influenza vaccine. This is recommended every year.  Tetanus, diphtheria, and acellular pertussis (Tdap, Td) vaccine. You may need a Td  booster every 10 years.  Zoster vaccine. You may need this after age 4.  Pneumococcal 13-valent conjugate (PCV13) vaccine. One dose is recommended after age 66.  Pneumococcal polysaccharide  (PPSV23) vaccine. One dose is recommended after age 17. Talk to your health care provider about which screenings and vaccines you need and how often you need them. This information is not intended to replace advice given to you by your health care provider. Make sure you discuss any questions you have with your health care provider. Document Released: 11/05/2015 Document Revised: 06/28/2016 Document Reviewed: 08/10/2015 Elsevier Interactive Patient Education  2017 [place] Prevention in the Home Falls can cause injuries. They can happen to people of all ages. There are many things you can do to make your home safe and to help prevent falls. What can I do on the outside of my home?  Regularly fix the edges of walkways and driveways and fix any cracks.  Remove anything that might make you trip as you walk through a door, such as a raised step or threshold.  Trim any bushes or trees on the path to your home.  Use bright outdoor lighting.  Clear any walking paths of anything that might make someone trip, such as rocks or tools.  Regularly check to see if handrails are loose or broken. Make sure that both sides of any steps have handrails.  Any raised decks and porches should have guardrails on the edges.  Have any leaves, snow, or ice cleared regularly.  Use sand or salt on walking paths during winter.  Clean up any spills in your garage right away. This includes oil or grease spills. What can I do in the bathroom?  Use night lights.  Install grab bars by the toilet and in the tub and shower. Do not use towel bars as grab bars.  Use non-skid mats or decals in the tub or shower.  If you need to sit down in the shower, use a plastic, non-slip stool.  Keep the floor dry. Clean up any water that spills on the floor as soon as it happens.  Remove soap buildup in the tub or shower regularly.  Attach bath mats securely with double-sided non-slip rug tape.  Do not have  throw rugs and other things on the floor that can make you trip. What can I do in the bedroom?  Use night lights.  Make sure that you have a light by your bed that is easy to reach.  Do not use any sheets or blankets that are too big for your bed. They should not hang down onto the floor.  Have a firm chair that has side arms. You can use this for support while you get dressed.  Do not have throw rugs and other things on the floor that can make you trip. What can I do in the kitchen?  Clean up any spills right away.  Avoid walking on wet floors.  Keep items that you use a lot in easy-to-reach places.  If you need to reach something above you, use a strong step stool that has a grab bar.  Keep electrical cords out of the way.  Do not use floor polish or wax that makes floors slippery. If you must use wax, use non-skid floor wax.  Do not have throw rugs and other things on the floor that can make you trip. What can I do with my stairs?  Do not leave any items on the stairs.  Make sure that there are handrails on both sides of the stairs and use them. Fix handrails that are broken or loose. Make sure that handrails are as long as the stairways.  Check any carpeting to make sure that it is firmly attached to the stairs. Fix any carpet that is loose or worn.  Avoid having throw rugs at the top or bottom of the stairs. If you do have throw rugs, attach them to the floor with carpet tape.  Make sure that you have a light switch at the top of the stairs and the bottom of the stairs. If you do not have them, ask someone to add them for you. What else can I do to help prevent falls?  Wear shoes that:  Do not have high heels.  Have rubber bottoms.  Are comfortable and fit you well.  Are closed at the toe. Do not wear sandals.  If you use a stepladder:  Make sure that it is fully opened. Do not climb a closed stepladder.  Make sure that both sides of the stepladder are  locked into place.  Ask someone to hold it for you, if possible.  Clearly mark and make sure that you can see:  Any grab bars or handrails.  First and last steps.  Where the edge of each step is.  Use tools that help you move around (mobility aids) if they are needed. These include:  Canes.  Walkers.  Scooters.  Crutches.  Turn on the lights when you go into a dark area. Replace any light bulbs as soon as they burn out.  Set up your furniture so you have a clear path. Avoid moving your furniture around.  If any of your floors are uneven, fix them.  If there are any pets around you, be aware of where they are.  Review your medicines with your doctor. Some medicines can make you feel dizzy. This can increase your chance of falling. Ask your doctor what other things that you can do to help prevent falls. This information is not intended to replace advice given to you by your health care provider. Make sure you discuss any questions you have with your health care provider. Document Released: 08/05/2009 Document Revised: 03/16/2016 Document Reviewed: 11/13/2014 Elsevier Interactive Patient Education  2017 [employer].

## 2021-03-02 ENCOUNTER — Encounter: Payer: Self-pay | Admitting: Orthopaedic Surgery

## 2021-03-02 ENCOUNTER — Ambulatory Visit: Payer: Medicare HMO | Admitting: Orthopaedic Surgery

## 2021-03-02 VITALS — BP 124/73 | HR 83

## 2021-03-02 DIAGNOSIS — M5126 Other intervertebral disc displacement, lumbar region: Secondary | ICD-10-CM

## 2021-03-02 DIAGNOSIS — Z96641 Presence of right artificial hip joint: Secondary | ICD-10-CM

## 2021-03-02 DIAGNOSIS — M1712 Unilateral primary osteoarthritis, left knee: Secondary | ICD-10-CM | POA: Diagnosis not present

## 2021-03-02 DIAGNOSIS — M170 Bilateral primary osteoarthritis of knee: Secondary | ICD-10-CM

## 2021-03-02 NOTE — Telephone Encounter (Cosign Needed)
3rd attempt

## 2021-03-02 NOTE — Progress Notes (Signed)
Office Visit Note   Patient: Desiree [name]           Date of Birth: 07-02-1951           MRN: [id number] Visit Date: 03/02/2021              Requested by: Madelin Headings, MD [address] [place],  [place] [address] PCP: Madelin Headings, MD   Assessment & Plan: Visit Diagnoses: 1.  Left knee primary osteoarthritis 2.  Post right total hip arthroplasty.  Plan: Injection performed left knee which she tolerated well.  Follow-Up Instructions: Return in about 2 months (around 05/02/2021).   Orders:  Orders Placed This Encounter  Procedures  . Large Joint Inj: L knee   No orders of the defined types were placed in this encounter.     Procedures: Large Joint Inj: L knee on 03/02/2021 11:47 AM Indications: joint swelling and pain Details: 22 G 1.5 in needle, anterolateral approach  Arthrogram: No  Medications: 0.5 mL lidocaine 1 %; 3 mL bupivacaine 0.5 %; 40 mg methylPREDNISolone acetate 40 MG/ML Outcome: tolerated well, no immediate complications Procedure, treatment alternatives, risks and benefits explained, specific risks discussed. Consent was given by the patient. Immediately prior to procedure a time out was called to verify the correct patient, procedure, equipment, support staff and site/side marked as required. Patient was prepped and draped in the usual sterile fashion.       Clinical Data: No additional findings.   Subjective: Chief Complaint  Patient presents with  . Right Hip - Routine Post Op    HPI follow-up right total hip arthroplasty 12/17/2020.  Hip is doing well but she is having increased problems with her left knee and is requesting an injection.  She is using Tylenol at night using a cane primarily due to her left knee symptoms more than her right hip.  She is happy with the pain relief in her right hip from her preop pain.  Review of Systems updated unchanged   Objective: Vital Signs: BP 124/73   Pulse 83   Physical  Exam Constitutional:      Appearance: She is well-developed.  HENT:     Head: Normocephalic.     Right Ear: External ear normal.     Left Ear: External ear normal.  Eyes:     Pupils: Pupils are equal, round, and reactive to light.  Neck:     Thyroid: No thyromegaly.     Trachea: No tracheal deviation.  Cardiovascular:     Rate and Rhythm: Normal rate.  Pulmonary:     Effort: Pulmonary effort is normal.  Abdominal:     Palpations: Abdomen is soft.  Skin:    General: Skin is warm and dry.  Neurological:     Mental Status: She is alert and oriented to person, place, and time.  Psychiatric:        Behavior: Behavior normal.     Ortho Exam well-healed right total hip arthroplasty incision left knee has crepitus more medial than lateral joint line tenderness.  Negative logroll to the left hip.  Distal pulses intact. Specialty Comments:  No specialty comments available.  Imaging: No results found.   PMFS History: Patient Active Problem List   Diagnosis Date Noted  . Insomnia secondary to chronic pain 12/27/2020  . Insomnia due to other mental disorder 12/27/2020  . Snoring 12/27/2020  . Inadequate sleep hygiene 12/27/2020  . S/P total right hip arthroplasty 12/18/2020  . Arthritis  of right hip 12/17/2020  . Encounter for preoperative assessment 12/10/2020  . Infection of nail bed of finger of right hand 09/15/2020  . Pain in right finger(s) 08/31/2020  . Ganglion of right wrist 08/31/2020  . COPD mixed type (HCC) 12/26/2018  . Healthcare maintenance 12/26/2018  . History of fall 11/05/2018  . Weight loss 11/05/2018  . Weakness 11/05/2018  . Gangrene of finger (HCC) 10/09/2018  . Raynaud's phenomenon with gangrene (HCC) 10/09/2018  . LVH (left ventricular hypertrophy) 10/09/2018  . Vasospasm (HCC) 09/06/2018  . Hypotension 08/08/2018  . Leucocytosis 08/08/2018  . Elevated troponin I level 08/08/2018  . Nausea and vomiting 08/06/2018  . Pneumonia 07/12/2018  .  COPD exacerbation (HCC) 07/11/2018  . Primary osteoarthritis of both feet 12/21/2017  . DDD (degenerative disc disease), lumbar 12/21/2017  . Primary osteoarthritis of both knees 12/21/2017  . History of bilateral carpal tunnel release 12/21/2017  . DDD (degenerative disc disease), cervical 11/15/2017  . Former smoker 11/15/2017  . Bronchiectasis without complication (HCC) 10/25/2017  . Impingement syndrome of right shoulder 07/25/2017  . Hx of fusion of cervical spine 07/25/2017  . Cervical spinal stenosis 09/27/2016  . Polypharmacy 06/23/2015  . Hyperlipidemia 04/19/2015  . Visit for preventive health examination 01/01/2015  . Primary osteoarthritis involving multiple joints 01/01/2015  . Bipolar affective disorder, currently depressed, moderate (HCC)   . Bipolar I disorder with mania (HCC) 08/17/2014  . Hypertension 10/21/2013  . Dizziness 03/25/2013  . Medication withdrawal (HCC) 03/05/2013  . Diabetes mellitus with renal manifestations, controlled (HCC) 11/10/2012  . Alopecia areata 02/06/2012  . Obesity (BMI 30-39.9) 02/06/2012  . Renal insufficiency 07/16/2011  . 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 Exertional dyspnea 02/07/2011  . Dyspnea on exertion 01/19/2011  . DM (diabetes mellitus), type 2 (HCC) 04/22/2010  . Carpal tunnel syndrome 02/02/2010  . CONSTIPATION 02/02/2010  . Primary osteoarthritis of both hands 02/02/2010  . CATARACTS 04/13/2009  . PAIN IN JOINT, ANKLE AND FOOT 09/16/2008  . Eosinophilia 07/21/2008  . AFFECTIVE DISORDER 04/21/2008  . BACK PAIN, CHRONIC 02/12/2008  . LEG PAIN 02/12/2008  . ABNORMAL INVOLUNTARY MOVEMENTS 12/04/2007  . POSTURAL LIGHTHEADEDNESS 11/04/2007  . COLONIC POLYPS, HX OF 11/04/2007  . Essential hypertension 06/17/2007  . HYPERLIPIDEMIA 04/08/2007  . MITRAL VALVE PROLAPSE 04/08/2007  . GERD 04/08/2007  . LOW BACK PAIN SYNDROME 04/08/2007  . CHEST PAIN, RECURRENT 04/08/2007   Past Medical History:  Diagnosis Date  . Acute  gastritis without bleeding 08/08/2018  . Anemia   . Anxiety   . Asthma   . Bipolar disorder (HCC)   . CANDIDIASIS, ESOPHAGEAL 07/28/2009   Qualifier: Diagnosis of  By: Fabian Sharp MD, Neta Mends   . Chronic kidney disease    CKD III  . Complication of anesthesia    was told she stopped breathing for one of her finger surgeries  . COPD (chronic obstructive pulmonary disease) (HCC)   . Depression   . Diabetes mellitus without complication (HCC)    no meds  . Dyspnea    At rest,and with activity  . FH: colonic polyps   . Fractured elbow    right   . GERD (gastroesophageal reflux disease)   . Headache    migraines  . HH (hiatus hernia)   . History of carpal tunnel syndrome   . History of chest pain   . History of transfusion of packed red blood cells   . Hyperlipidemia   . Hypertension   . Mitral valve prolapse   .  Neuroleptic-induced tardive dyskinesia   . Osteoarthritis of more than one site   . Pneumonia   . Seasonal allergies   . Stroke [facility])     Family History  Problem Relation Age of Onset  . Heart attack Father   . Heart disease Father   . Throat cancer Brother   . Diabetes Mother   . Hypertension Mother   . Heart attack Mother   . Diabetes type II Brother   . Heart disease Brother   . Lung cancer Brother   . Breast cancer Cousin   . Lung cancer Daughter   . Lung cancer Paternal Uncle     Past Surgical History:  Procedure Laterality Date  . AMPUTATION Left 10/14/2018   Procedure: AMPUTATION LEFT LONG FINGER TIP;  Surgeon: Knute Neu, MD;  Location: [facility] OR;  Service: Plastics;  Laterality: Left;  . ANTERIOR CERVICAL DECOMP/DISCECTOMY FUSION  09/27/2016   C5-6 anterior cervical discectomy and fusion, allograft and plate/notes 11/27/4268  . ANTERIOR CERVICAL DECOMP/DISCECTOMY FUSION N/A 09/27/2016   Procedure: C5-6 Anterior Cervical Discectomy and Fusion, Allograft and Plate;  Surgeon: Eldred Manges, MD;  Location: [facility] OR;  Service: Orthopedics;  Laterality: N/A;  .  Back Fusion  2002  . BIOPSY  07/19/2018   Procedure: BIOPSY;  Surgeon: Jeani Hawking, MD;  Location: [facility] ENDOSCOPY;  Service: Endoscopy;;  . BIOPSY  02/13/2019   Procedure: BIOPSY;  Surgeon: Jeani Hawking, MD;  Location: [facility] ENDOSCOPY;  Service: Endoscopy;;  . CARPAL TUNNEL RELEASE  Caylen Yardley   left  . COLONOSCOPY N/A 01/05/2014   Procedure: COLONOSCOPY;  Surgeon: Charna Elizabeth, MD;  Location: [facility] ENDOSCOPY;  Service: Endoscopy;  Laterality: N/A;  . ELBOW SURGERY     age 27  . ENTEROSCOPY N/A 02/13/2019   Procedure: ENTEROSCOPY;  Surgeon: Jeani Hawking, MD;  Location: [facility] ENDOSCOPY;  Service: Endoscopy;  Laterality: N/A;  . ESOPHAGOGASTRODUODENOSCOPY (EGD) WITH PROPOFOL N/A 07/19/2018   Procedure: ESOPHAGOGASTRODUODENOSCOPY (EGD) WITH PROPOFOL;  Surgeon: Jeani Hawking, MD;  Location: [facility] ENDOSCOPY;  Service: Endoscopy;  Laterality: N/A;  . EXTERNAL EAR SURGERY Left   . EYE SURGERY     "removed white dots under eyelid"  . FINGER SURGERY Left   . Juvara osteomy    . KNEE SURGERY    . NOSE SURGERY    . Rt. toe bunion    . skin, shave biopsy  05/03/2016   Left occipital scalp, top of scalp  . TONSILLECTOMY    . TOTAL HIP ARTHROPLASTY Right 12/17/2020  . TOTAL HIP ARTHROPLASTY Right 12/17/2020   Procedure: TOTAL HIP ARTHROPLASTY-DIRECT ANTERIOR;  Surgeon: Eldred Manges, MD;  Location: [facility] OR;  Service: Orthopedics;  Laterality: Right;  needs RNFA  . UPPER EXTREMITY ANGIOGRAPHY Bilateral 10/11/2018   Procedure: UPPER EXTREMITY ANGIOGRAPHY;  Surgeon: Sherren Kerns, MD;  Location: [facility] INVASIVE CV LAB;  Service: Cardiovascular;  Laterality: Bilateral;   Social History   Occupational History  . Not on file  Tobacco Use  . Smoking status: Former Smoker    Packs/day: 2.00    Years: 30.00    Pack years: 60.00    Types: Cigarettes    Quit date: 10/23/2001    Years since quitting: 19.3  . Smokeless tobacco: Never Used  Vaping Use  . Vaping Use: Never used  Substance and Sexual Activity  . Alcohol use:  No  . Drug use: No  . Sexual activity: Not on file

## 2021-03-03 DIAGNOSIS — M5126 Other intervertebral disc displacement, lumbar region: Secondary | ICD-10-CM | POA: Insufficient documentation

## 2021-03-03 MED ORDER — BUPIVACAINE HCL 0.5 % IJ SOLN
3.0000 mL | INTRAMUSCULAR | Status: AC | PRN
  Administered 2021-03-02: 3 mL via INTRA_ARTICULAR

## 2021-03-03 MED ORDER — METHYLPREDNISOLONE ACETATE 40 MG/ML IJ SUSP
40.0000 mg | INTRAMUSCULAR | Status: AC | PRN
  Administered 2021-03-02: 40 mg via INTRA_ARTICULAR

## 2021-03-03 MED ORDER — LIDOCAINE HCL 1 % IJ SOLN
0.5000 mL | INTRAMUSCULAR | Status: AC | PRN
Start: 2021-03-02 — End: 2021-03-02
  Administered 2021-03-02: .5 mL
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
## 2021-03-17 NOTE — Chronic Care Management (AMB) (Signed)
    Chronic Care Management Pharmacy Assistant   Name: Desiree [name]  MRN: [id number] DOB: Jul 05, 1951  Called Dr. Trilby Drummer office per [name] and left message to have refills sent to [facility] for patient zofran. Spoke with Tori at front desk who stated she will send message to medical assistant. Madeline made aware.   Joycelyn Das CMA  Clinical Pharmacist Assistant [phone]

## 2021-03-18 ENCOUNTER — Telehealth: Payer: Self-pay | Admitting: *Deleted

## 2021-03-18 NOTE — Telephone Encounter (Signed)
Ortho bundle 90 day call completed. 

## 2021-03-18 NOTE — Progress Notes (Addendum)
    Chronic Care Management Pharmacy Assistant   Name: Desiree [name]  MRN: [id number] DOB: 02-03-1951  Spoke with patient per [name] and [facility]. Patient states she does not want to charge the AR system and patient actually spoke with [facility] who told her they will reach out to her physicians to switch all of her medications over to them except for the Azelastine nasal spray. That is the only medication she wishes to keep getting from [facility]. pharmacy and [facility] aware.   Joycelyn Das CMA  Clinical Pharmacist Assistant [phone]

## 2021-03-22 ENCOUNTER — Other Ambulatory Visit: Payer: Self-pay

## 2021-03-22 ENCOUNTER — Telehealth: Payer: Self-pay

## 2021-03-22 MED ORDER — FAMOTIDINE 40 MG PO TABS: 40.0000 mg | ORAL_TABLET | Freq: Every day | ORAL | 0 refills | Status: DC

## 2021-03-22 MED ORDER — MONTELUKAST SODIUM 10 MG PO TABS: ORAL_TABLET | ORAL | 2 refills | Status: DC

## 2021-03-22 MED ORDER — LOSARTAN POTASSIUM 25 MG PO TABS: ORAL_TABLET | ORAL | 1 refills | Status: DC

## 2021-03-22 MED ORDER — AMLODIPINE BESYLATE 5 MG PO TABS: ORAL_TABLET | ORAL | 2 refills | Status: DC

## 2021-03-29 ENCOUNTER — Encounter: Payer: Self-pay | Admitting: Physician Assistant

## 2021-03-29 ENCOUNTER — Ambulatory Visit: Payer: Self-pay

## 2021-03-29 ENCOUNTER — Other Ambulatory Visit: Payer: Self-pay

## 2021-03-29 ENCOUNTER — Telehealth: Payer: Self-pay | Admitting: Pharmacist

## 2021-03-29 ENCOUNTER — Ambulatory Visit: Payer: Medicare HMO | Admitting: Physician Assistant

## 2021-03-29 VITALS — BP 117/77 | HR 99 | Resp 17 | Ht 63.0 in | Wt 163.0 lb

## 2021-03-29 DIAGNOSIS — R296 Repeated falls: Secondary | ICD-10-CM

## 2021-03-29 DIAGNOSIS — R768 Other specified abnormal immunological findings in serum: Secondary | ICD-10-CM

## 2021-03-29 DIAGNOSIS — Z96641 Presence of right artificial hip joint: Secondary | ICD-10-CM

## 2021-03-29 DIAGNOSIS — I10 Essential (primary) hypertension: Secondary | ICD-10-CM

## 2021-03-29 DIAGNOSIS — M19072 Primary osteoarthritis, left ankle and foot: Secondary | ICD-10-CM

## 2021-03-29 DIAGNOSIS — M19071 Primary osteoarthritis, right ankle and foot: Secondary | ICD-10-CM

## 2021-03-29 DIAGNOSIS — M0579 Rheumatoid arthritis with rheumatoid factor of multiple sites without organ or systems involvement: Secondary | ICD-10-CM

## 2021-03-29 DIAGNOSIS — M7541 Impingement syndrome of right shoulder: Secondary | ICD-10-CM

## 2021-03-29 DIAGNOSIS — M19042 Primary osteoarthritis, left hand: Secondary | ICD-10-CM

## 2021-03-29 DIAGNOSIS — Z79899 Other long term (current) drug therapy: Secondary | ICD-10-CM

## 2021-03-29 DIAGNOSIS — M170 Bilateral primary osteoarthritis of knee: Secondary | ICD-10-CM

## 2021-03-29 DIAGNOSIS — M19041 Primary osteoarthritis, right hand: Secondary | ICD-10-CM

## 2021-03-29 DIAGNOSIS — Z87891 Personal history of nicotine dependence: Secondary | ICD-10-CM

## 2021-03-29 DIAGNOSIS — M1611 Unilateral primary osteoarthritis, right hip: Secondary | ICD-10-CM

## 2021-03-29 DIAGNOSIS — Z8659 Personal history of other mental and behavioral disorders: Secondary | ICD-10-CM

## 2021-03-29 DIAGNOSIS — Z8639 Personal history of other endocrine, nutritional and metabolic disease: Secondary | ICD-10-CM

## 2021-03-29 DIAGNOSIS — F319 Bipolar disorder, unspecified: Secondary | ICD-10-CM | POA: Diagnosis not present

## 2021-03-29 DIAGNOSIS — M5030 Other cervical disc degeneration, unspecified cervical region: Secondary | ICD-10-CM

## 2021-03-29 DIAGNOSIS — M5136 Other intervertebral disc degeneration, lumbar region: Secondary | ICD-10-CM

## 2021-03-29 DIAGNOSIS — J479 Bronchiectasis, uncomplicated: Secondary | ICD-10-CM

## 2021-03-29 DIAGNOSIS — I7301 Raynaud's syndrome with gangrene: Secondary | ICD-10-CM | POA: Diagnosis not present

## 2021-03-29 DIAGNOSIS — E782 Mixed hyperlipidemia: Secondary | ICD-10-CM

## 2021-03-29 MED ORDER — HYDROXYCHLOROQUINE SULFATE 200 MG PO TABS: 200.0000 mg | ORAL_TABLET | Freq: Every day | ORAL | 0 refills | Status: DC

## 2021-03-29 NOTE — Patient Instructions (Addendum)
Standing Labs We placed an order today for your standing lab work.   Please have your standing labs drawn in 1 month, 3 months, then every 5 months   If possible, please have your labs drawn 2 weeks prior to your appointment so that the provider can discuss your results at your appointment.  Please note that you may see your imaging and lab results in MyChart before we have reviewed them. We may be awaiting multiple results to interpret others before contacting you. Please allow our office up to 72 hours to thoroughly review all of the results before contacting the office for clarification of your results.  We have open lab daily: Monday through Thursday from 1:30-4:30 PM and Friday from 1:30-4:00 PM at the office of Dr. [name], [facility].   Please be advised, all patients with office appointments requiring lab work will take precedent over walk-in lab work.  If possible, please come for your lab work on Monday and Friday afternoons, as you may experience shorter wait times. The office is located at [address], [address], [place], [place] [address] No appointment is necessary.   Labs are drawn by [facility]. Please bring your co-pay at the time of your lab draw.  You may receive a bill from [facility] for your lab work.  If you wish to have your labs drawn at another location, please call the office 24 hours in advance to send orders.  If you have any questions regarding directions or hours of operation,  please call [phone].   As a reminder, please drink plenty of water prior to coming for your lab work. Thanks! Hydroxychloroquine tablets What is this medicine? HYDROXYCHLOROQUINE (hye drox ee KLOR oh kwin) is used to treat rheumatoid arthritis and systemic lupus erythematosus. It is also used to treat malaria. This medicine may be used for other purposes; ask your health care provider or pharmacist if you have questions. COMMON BRAND NAME(S): Plaquenil,  Quineprox What should I tell my health care provider before I take this medicine? They need to know if you have any of these conditions:  diabetes  eye disease, vision problems  G6PD deficiency  heart disease  history of irregular heartbeat  if you often drink alcohol  kidney disease  liver disease  porphyria  psoriasis  an unusual or allergic reaction to chloroquine, hydroxychloroquine, other medicines, foods, dyes, or preservatives  pregnant or trying to get pregnant  breast-feeding How should I use this medicine? Take this medicine by mouth with a glass of water. Take it as directed on the prescription label. Do not cut, crush or chew this medicine. Swallow the tablets whole. Take it with food. Do not take it more than directed. Take all of this medicine unless your health care provider tells you to stop it early. Keep taking it even if you think you are better. Take products with antacids in them at a different time of day than this medicine. Take this medicine 4 hours before or 4 hours after antacids. Talk to your health care provider if you have questions. Talk to your pediatrician regarding the use of this medicine in children. While this drug may be prescribed for selected conditions, precautions do apply. Overdosage: If you think you have taken too much of this medicine contact a poison control center or emergency room at once. NOTE: This medicine is only for you. Do not share this medicine with others. What if I miss a dose? If you miss a dose, take  it as soon as you can. If it is almost time for your next dose, take only that dose. Do not take double or extra doses. What may interact with this medicine? Do not take this medicine with any of the following medications:  cisapride  dronedarone  pimozide  thioridazine This medicine may also interact with the following  medications:  ampicillin  antacids  cimetidine  cyclosporine  digoxin  kaolin  medicines for diabetes, like insulin, glipizide, glyburide  medicines for seizures like carbamazepine, phenobarbital, phenytoin  mefloquine  methotrexate  other medicines that prolong the QT interval (cause an abnormal heart rhythm)  praziquantel This list may not describe all possible interactions. Give your health care provider a list of all the medicines, herbs, non-prescription drugs, or dietary supplements you use. Also tell them if you smoke, drink alcohol, or use illegal drugs. Some items may interact with your medicine. What should I watch for while using this medicine? Visit your health care provider for regular checks on your progress. Tell your health care provider if your symptoms do not start to get better or if they get worse. You may need blood work done while you are taking this medicine. If you take other medicines that can affect heart rhythm, you may need more testing. Talk to your health care provider if you have questions. Your vision may be tested before and during use of this medicine. Tell your health care provider right away if you have any change in your eyesight. This medicine may cause serious skin reactions. They can happen weeks to months after starting the medicine. Contact your health care provider right away if you notice fevers or flu-like symptoms with a rash. The rash may be red or purple and then turn into blisters or peeling of the skin. Or, you might notice a red rash with swelling of the face, lips or lymph nodes in your neck or under your arms. If you or your family notice any changes in your behavior, such as new or worsening depression, thoughts of harming yourself, anxiety, or other unusual or disturbing thoughts, or memory loss, call your health care provider right away. What side effects may I notice from receiving this medicine? Side effects that you should  report to your doctor or health care professional as soon as possible:  allergic reactions (skin rash, itching or hives; swelling of the face, lips, or tongue)  changes in vision  decreased hearing, ringing in the ears  heartbeat rhythm changes (trouble breathing; chest pain; dizziness; fast, irregular heartbeat; feeling faint or lightheaded, falls)  liver injury (dark yellow or brown urine; general ill feeling or flu-like symptoms; loss of appetite, right upper belly pain; unusually weak or tired, yellowing of the eyes or skin)  low blood sugar (feeling anxious; confusion; dizziness; increased hunger; unusually weak or tired; increased sweating; shakiness; cold, clammy skin; irritable; headache; blurred vision; fast heartbeat; loss of consciousness)  low red blood cell counts (trouble breathing; feeling faint; lightheaded, falls; unusually weak or tired)  muscle weakness  pain, tingling, numbness in the hands or feet  rash, fever, and swollen lymph nodes  redness, blistering, peeling or loosening of the skin, including inside the mouth  suicidal thoughts, mood changes  uncontrollable head, mouth, neck, arm, or leg movements  unusual bruising or bleeding Side effects that usually do not require medical attention (report to your doctor or health care professional if they continue or are bothersome):  diarrhea  hair loss  irritable This list may  not describe all possible side effects. Call your doctor for medical advice about side effects. You may report side effects to FDA at 1-800-FDA-1088. Where should I keep my medicine? Keep out of the reach of children and pets. Store at room temperature up to 30 degrees C (86 degrees F). Protect from light. Get rid of any unused medicine after the expiration date. To get rid of medicines that are no longer needed or have expired:  Take the medicine to a medicine take-back program. Check with your pharmacy or law enforcement to find a  location.  If you cannot return the medicine, check the label or package insert to see if the medicine should be thrown out in the garbage or flushed down the toilet. If you are not sure, ask your health care provider. If it is safe to put it in the trash, empty the medicine out of the container. Mix the medicine with cat litter, dirt, coffee grounds, or other unwanted substance. Seal the mixture in a bag or container. Put it in the trash. NOTE: This sheet is a summary. It may not cover all possible information. If you have questions about this medicine, talk to your doctor, pharmacist, or health care provider.  2021 [employer] (2020-03-29 15:07:49)

## 2021-03-29 NOTE — Chronic Care Management (AMB) (Signed)
Date- Patient called to remind of appointment with Sheran Lawless on 03/30/2021 at 9:45 am.  No answer, left message of appointment date, time and type of appointment (telephone ). Left message to have all medications, supplements, blood pressure and/or blood sugar logs available during appointment and to return call if need to reschedule.   Star Rating Drug: Medication Dispensed Quantity Pharmacy  Losartan 25 mg 02.11.2022 [facility]    Any gaps in medications fill history?  Berenice Bouton, CMA Clinical Pharmacist Assistant [phone]

## 2021-03-30 ENCOUNTER — Telehealth: Payer: Self-pay | Admitting: Pulmonary Disease

## 2021-03-30 ENCOUNTER — Ambulatory Visit (INDEPENDENT_AMBULATORY_CARE_PROVIDER_SITE_OTHER): Payer: Medicare HMO | Admitting: Pharmacist

## 2021-03-30 DIAGNOSIS — E1122 Type 2 diabetes mellitus with diabetic chronic kidney disease: Secondary | ICD-10-CM

## 2021-03-30 DIAGNOSIS — I10 Essential (primary) hypertension: Secondary | ICD-10-CM

## 2021-03-30 MED ORDER — AZELASTINE HCL 0.1 % NA SOLN: 2.0000 | Freq: Two times a day (BID) | NASAL | 11 refills | Status: DC

## 2021-03-30 NOTE — Telephone Encounter (Signed)
Spoke with the pt  Rx for astelin sent to [facility]- sent a 30 day supply per her request  Nothing further needed

## 2021-03-30 NOTE — Progress Notes (Signed)
Chronic Care Management Pharmacy Note  04/21/2021 Name:  Desiree [name] MRN:  [id number] DOB:  08/25/1951  Summary: LDL not at goal < 70  Patient continues to have affordability issues with medications  Recommendations/Changes made from today's visit: -Recommended purchasing a home BP cuff to monitor blood pressures -Recommend high intensity statin therapy based on previous history of stroke and LDL > 70  Plan: Facilitate completion of PAP for Linzess and Restasis Follow up BP assessment in 2 months   Subjective: Desiree [name] is an 70 y.o. year old female who is a primary patient of [name], [name], MD.  The CCM team was consulted for assistance with disease management and care coordination needs.    Engaged with patient by telephone for follow up visit in response to provider referral for pharmacy case management and/or care coordination services.   Consent to Services:  The patient was given information about Chronic Care Management services, agreed to services, and gave verbal consent prior to initiation of services.  Please see initial visit note for detailed documentation.   Patient Care Team: [name], [name], MD as PCP - General (Internal Medicine) Josue Hector, MD as PCP - Cardiology (Cardiology) [name], MD (Orthopedic Surgery) Cottle, Billey Co., MD as Attending Physician (Psychiatry) [name], MD as Consulting Physician (Gastroenterology) Elmarie Shiley, MD as Consulting Physician (Nephrology) Druscilla Brownie, MD as Consulting Physician (Dermatology) Arvella Nigh, MD as Consulting Physician (Obstetrics and Gynecology) Gean Birchwood, DPM as Consulting Physician (Podiatry) [name], MD as Consulting Physician (Pulmonary Disease) Bo Merino, MD as Consulting Physician (Rheumatology) Nicholas Lose, MD as Consulting Physician (Hematology and Oncology) Alen Blew as Physician Assistant (Psychiatry) Julian Hy, DO as  Consulting Physician (Pulmonary Disease) [name], MD as Consulting Physician (Orthopedic Surgery) Viona Gilmore, [facility] as Pharmacist (Pharmacist) [name], MD as Consulting Physician (Orthopedic Surgery)  Recent office visits: 03/01/21 Randel Pigg, LPN: Patient presented for AWV.  12/27/20 Shanon Ace, MD: Patient presented for video visit for chronic conditions follow up.   Recent consult visits: 03/29/21 Hazel Sams, PA-C (rheumatology): Patient presented for Raynaud's follow up. Plan for another trial of Plaquenil 200 mg daily.   03/02/21 Rodell Perna, MD (orthopedic surgery): Patient presented for right hip pain post op follow up. Steroid injection administered.  02/18/21 Larey Days MD (Pulmonary Disease) - presented to clinic for COPD. Removed COVID-19 vaccines from list. Follow up in 12 months.   01/28/21 [name], MD (cardiology): Patient presented for HTN follow up.   12/31/20 Rodell Perna, MD (ortho surgery): Patient presented for right hip pain post op. Staples removed and she is using a cane.  12/10/20 Tammy Parrett, NP (pulmonary disease): Patient presented for follow up for bronchiectasis. No medication changes.  12/09/20 Benjiman Core, PA-C (ortho): Patient presented for right hip follow up. Plan for hip replacement.  11/25/20 Larey Seat, MD (neurology): Patient presented for initial visit for insomnia. Plan for sleep study.  Hospital visits: Admitted to the hospital on 12/17/2020 due to Total right hip arthroplasty Discharge date was 12/21/2020. Discharged from [facility]?Medications Started at [facility] Discharge:?? -started the following medications due to surgery Aspirin EC 325 MG tablet. methocarbamol (ROBAXIN) 500 MG tablet Oxycodone-acetaminophen (PERCOCET/ROXICET) 5-325 MG tablet   Medications that remain the same after Hospital Discharge:??  -All other medications will remain the same.  Objective:  Lab Results   Component Value Date  CREATININE 1.4 (A) 02/09/2021   CREATININE 1.4 (A) 02/09/2021   BUN 19 12/18/2020   GFR 43.46 (L) 03/10/2020   GFRNONAA 35 (L) 12/18/2020   GFRAA 43 02/09/2021   NA 140 02/09/2021   K 4.7 02/09/2021   CALCIUM 10.2 02/09/2021   CO2 26 (A) 02/09/2021   GLUCOSE 115 (H) 12/18/2020    Lab Results  Component Value Date/Time   HGBA1C 5.9 (H) 12/13/2020 03:29 PM   HGBA1C 5.7 (A) 07/09/2020 11:48 AM   HGBA1C 5.7 (A) 01/07/2020 02:45 PM   HGBA1C 4.9 10/10/2018 01:25 AM   GFR 43.46 (L) 03/10/2020 11:00 AM   GFR 26.14 (L) 12/03/2018 02:16 PM   MICROALBUR 3.1 (H) 12/31/2015 08:08 AM   MICROALBUR 0.6 01/26/2010 12:00 AM    Last diabetic Eye exam:  Lab Results  Component Value Date/Time   HMDIABEYEEXA No Retinopathy 09/25/2016 12:00 AM    Last diabetic Foot exam: No results found for: HMDIABFOOTEX   Lab Results  Component Value Date   CHOL 190 03/10/2020   HDL 57.90 03/10/2020   LDLCALC 114 (H) 03/10/2020   LDLDIRECT 146.3 01/26/2010   TRIG 88.0 03/10/2020   CHOLHDL 3 03/10/2020    Hepatic Function Latest Ref Rng & Units 02/09/2021 10/29/2020 03/10/2020  Total Protein 6.5 - 8.1 g/dL - 8.0 8.0  Albumin 3.5 - 5.0 4.5 4.3 4.0  AST 15 - 41 U/L - 32 25  ALT 0 - 44 U/L - 23 16  Alk Phosphatase 38 - 126 U/L - 76 87  Total Bilirubin 0.3 - 1.2 mg/dL - 0.5 0.4  Bilirubin, Direct 0.0 - 0.3 mg/dL - - 0.1    Lab Results  Component Value Date/Time   TSH 2.153 10/10/2018 01:25 AM   TSH 3.398 08/06/2018 11:36 PM   TSH 1.54 08/07/2017 03:27 PM   TSH 1.72 12/31/2015 08:08 AM   FREET4 0.73 08/07/2017 03:27 PM    CBC Latest Ref Rng & Units 02/09/2021 12/19/2020 12/18/2020  WBC 4.0 - 10.5 K/uL - 9.5 7.5  Hemoglobin 12.0 - 16.0 12.4 9.5(L) 10.7(L)  Hematocrit 36 - 46 38 30.2(L) 32.1(L)  Platelets 150 - 400 K/uL - 190 224    Lab Results  Component Value Date/Time   VD25OH 32.0 01/20/2019 12:00 AM   VD25OH 53.49 04/01/2015 09:48 AM    Clinical ASCVD: Yes  The  ASCVD Risk score Mikey Bussing DC Jr., et al., 2013) failed to calculate for the following reasons:   The patient has a prior MI or stroke diagnosis    Depression screen [facility] 2/9 03/01/2021 03/01/2021 06/02/2020  Decreased Interest 0 0 0  Down, Depressed, Hopeless 0 0 0  PHQ - 2 Score 0 0 0  Some recent data might be hidden    No flowsheet data found.   No flowsheet data found.    Social History   Tobacco Use  Smoking Status Former   Packs/day: 2.00   Years: 30.00   Pack years: 60.00   Types: Cigarettes   Quit date: 10/23/2001   Years since quitting: 19.5  Smokeless Tobacco Never   BP Readings from Last 3 Encounters:  03/29/21 117/77  03/02/21 124/73  02/18/21 110/70   Pulse Readings from Last 3 Encounters:  03/29/21 99  03/02/21 83  02/18/21 100   Wt Readings from Last 3 Encounters:  03/29/21 163 lb (73.9 kg)  02/18/21 158 lb (71.7 kg)  01/28/21 162 lb (73.5 kg)   BMI Readings from Last 3 Encounters:  03/29/21 28.87 kg/m  02/18/21 28.90 kg/m  01/28/21 29.63 kg/m    Assessment/Interventions: Review of patient past medical history, allergies, medications, health status, including review of consultants reports, laboratory and other test data, was performed as part of comprehensive evaluation and provision of chronic care management services.   SDOH:  (Social Determinants of Health) assessments and interventions performed: No  SDOH Screenings   Alcohol Screen: Not on file  Depression (PHQ2-9): Low Risk    PHQ-2 Score: 0  Financial Resource Strain: Low Risk    Difficulty of Paying Living Expenses: Not hard at all  Food Insecurity: Not on file  Housing: [place] Risk Score: 0  Physical Activity: Inactive   Days of Exercise per Week: 0 days   Minutes of Exercise per Session: 0 min  Social Connections: Moderately Isolated   Frequency of Communication with Friends and Family: Twice a week   Frequency of Social Gatherings with Friends and Family: Twice  a week   Attends Religious Services: 1 to 4 times per year   Active Member of [employer] or Organizations: No   Attends Music therapist: Never   Marital Status: Divorced  Stress: No Stress Concern Present   Feeling of Stress : Not at all  Tobacco Use: Medium Risk   Smoking Tobacco Use: Former   Smokeless Tobacco Use: Never  Transportation Needs: No Data processing manager (Medical): No   Lack of Transportation (Non-Medical): No   Patient had a hip replacement and is having pain in lower back and knees and fingers. Patient is supposed to start a new prescription for her rheumatoid arthritis but has not started yet.  CCM Care Plan  Allergies  Allergen Reactions   Prednisone Shortness Of Breath, Itching, Nausea And Vomiting and Palpitations   Solu-Medrol [Methylprednisolone] Anaphylaxis   Amoxicillin Hives and Rash    Has patient had a PCN reaction causing immediate rash, facial/tongue/throat swelling, SOB or lightheadedness with hypotension:Yes Has patient had a PCN reaction causing severe rash involving mucus membranes or skin necrosis: No Has patient had a PCN reaction that required hospitalization: No Has patient had a PCN reaction occurring within the last 10 years: No If all of the above answers are "NO", then may proceed with Cephalosporin use.    Codeine Hives, Itching and Rash    Tolerated oxycodone and morphine previously   Hydrocodone-Acetaminophen Hives, Itching and Rash    Tolerated oxycodone and morphine previously   Penicillins Hives, Itching and Rash    ALLERGIC REACTION TO ORAL AMOXICILLIN Has patient had a PCN reaction causing immediate rash, facial/tongue/throat swelling, SOB or lightheadedness with hypotension: Yes Has patient had a PCN reaction causing severe rash involving mucus membranes or skin necrosis: No Has patient had a PCN reaction that required hospitalization: No Has patient had a PCN reaction occurring within the last  10 years: No If all of the above answers are "NO", then may proceed with Cephalosporin use.   Tape Other (See Comments)    sore    Medications Reviewed Today     Reviewed by Su Monks (Physician Assistant Certified) on 90/24/09 at 1455  Med List Status: <None>   Medication Order Taking? Sig Documenting Provider Last Dose Status Informant  Accu-Chek Softclix Lancets lancets [id number] Yes Use to test blood sugar daily. Dx:e11.9 [name], [name], MD Taking Active Self  acetaminophen-codeine (TYLENOL #3) 300-30 MG tablet [id number] No Take 1 tablet by mouth 3 (three) times daily as needed  for moderate pain.  Patient not taking: No sig reported   [name], MD Not Taking Active   Alcohol Swabs (B-D SINGLE USE SWABS REGULAR) PADS [id number] Yes USE AS DIRECTED [name], [name], MD Taking Active   amLODipine (NORVASC) 5 MG tablet [id number] Yes TAKE ONE TABLET BY MOUTH EVERY MORNING [name], [name], MD Taking Active   aspirin EC 325 MG tablet [id number] No Take 1 tablet (325 mg total) by mouth daily. Must take at least 4 weeks postop for DVT prophylaxis.  Patient not taking: Reported on 03/29/2021   Lanae Crumbly, PA-C Not Taking Active   azelastine (ASTELIN) 0.1 % nasal spray [id number] Yes Place 2 sprays into both nostrils 2 (two) times daily. Use in each nostril as directed Hunsucker, Bonna Gains, MD Taking Active   Budeson-Glycopyrrol-Formoterol (BREZTRI AEROSPHERE) 160-9-4.8 MCG/ACT AERO [id number] Yes Inhale 2 puffs into the lungs in the morning and at bedtime. Parrett, Fonnie Mu, NP Taking Active   Carboxymethylcellulose Sodium (THERATEARS) 0.25 % SOLN [id number] Yes Place 1 drop into both eyes 3 (three) times daily as needed (dry eyes). [provider] Taking Active            Med Note Wilmon Pali, MELISSA R   Wed Dec 08, 2020  3:33 PM) Dry eye Therapy   cetirizine (ZYRTEC) 10 MG tablet [id number] Yes Take 10 mg by mouth at bedtime.  [provider] Taking Active Self   diclofenac sodium (VOLTAREN) 1 % GEL [id number] Yes APPLY 2-4 GRAMS TO AFFECTED JOINTS UP TO FOUR TIMES DAILY  Patient taking differently: Apply 2-4 g topically 4 (four) times daily as needed (pain).   Bo Merino, MD Taking Active   famotidine (PEPCID) 40 MG tablet [id number] Yes Take 1 tablet (40 mg total) by mouth daily. [name], [name], MD Taking Active   feeding supplement, ENSURE Jeanne Ivan (ENSURE ENLIVE) LIQD [id number] Yes Take 237 mLs by mouth 3 (three) times daily between meals. [name], MD Taking Active Self  fluticasone [facility]) 50 MCG/ACT nasal spray [id number] Yes Place 2 sprays into both nostrils at bedtime. [provider] Taking Active Self  linaclotide Rolan Lipa) 145 MCG CAPS capsule [id number] Yes Take 145 mcg by mouth daily. [provider] Taking Active Self  losartan (COZAAR) 25 MG tablet [id number] Yes TAKE ONE TABLET BY MOUTH EVERY MORNING [name], [name], MD Taking Active   methocarbamol (ROBAXIN) 500 MG tablet [id number] No Take 1 tablet (500 mg total) by mouth every 6 (six) hours as needed for muscle spasms.  Patient not taking: No sig reported   [name], MD Not Taking Active   montelukast (SINGULAIR) 10 MG tablet [id number] Yes TAKE 1 TABLET(10 MG) BY MOUTH AT BEDTIME [name], [name], MD Taking Active   Multiple Vitamins-Minerals (CENTRUM SILVER 50+WOMEN) TABS [id number] Yes Take 1 tablet by mouth daily. [provider] Taking Active Self  nitroGLYCERIN (NITRO-BID) 2 % ointment [id number] Yes APPLY 1/4 INCH TOPICALLY TO AFFECTED FINGERTIPS 3 TIMES A DAY  Patient taking differently: Apply 0.25 inches topically 3 (three) times daily. APPLY 1/4 INCH TOPICALLY TO AFFECTED FINGERTIPS 3 TIMES A DAY   Deveshwar, Shaili, MD Taking Active   ondansetron (ZOFRAN-ODT) 4 MG disintegrating tablet [id number] Yes DISSOLVE 1 TABLET(4 MG) ON THE TONGUE EVERY 8 HOURS AS NEEDED FOR NAUSEA OR VOMITING  Patient taking differently: Take 4 mg by mouth every 8  (eight) hours as needed for nausea or vomiting.   [name], [name], MD Taking Active   Respiratory  Therapy Supplies (FLUTTER) DEVI [id number] Yes 1 Device by Does not apply route as directed. Parrett, Fonnie Mu, NP Taking Active Self  RESTASIS 0.05 % ophthalmic emulsion [id number] Yes Place 1 drop into both eyes 2 (two) times daily. [provider] Taking Active Self  triamcinolone cream (KENALOG) 0.1 % [id number] Yes Apply 1 application topically daily as needed (for itching of affected areas).  [provider] Taking Active Self            Patient Active Problem List   Diagnosis Date Noted   Insomnia secondary to chronic pain 12/27/2020   Insomnia due to other mental disorder 12/27/2020   Snoring 12/27/2020   Inadequate sleep hygiene 12/27/2020   S/P total right hip arthroplasty 12/18/2020   Arthritis of right hip 12/17/2020   Encounter for preoperative assessment 12/10/2020   Infection of nail bed of finger of right hand 09/15/2020   Pain in right finger(s) 08/31/2020   Ganglion of right wrist 08/31/2020   COPD mixed type ([place]) 12/26/2018   Healthcare maintenance 12/26/2018   History of fall 11/05/2018   Weight loss 11/05/2018   Weakness 11/05/2018   Gangrene of finger ([place]) 10/09/2018   Raynaud's phenomenon with gangrene ([place]) 10/09/2018   LVH (left ventricular hypertrophy) 10/09/2018   Vasospasm ([place]) 09/06/2018   Hypotension 08/08/2018   Leucocytosis 08/08/2018   Elevated troponin I level 08/08/2018   Nausea and vomiting 08/06/2018   Pneumonia 07/12/2018   COPD exacerbation ([place]) 07/11/2018   Primary osteoarthritis of both feet 12/21/2017   DDD (degenerative disc disease), lumbar 12/21/2017   Primary osteoarthritis of both knees 12/21/2017   History of bilateral carpal tunnel release 12/21/2017   DDD (degenerative disc disease), cervical 11/15/2017   Former smoker 11/15/2017   Bronchiectasis without complication ([place]) 42/59/5638   Impingement syndrome  of right shoulder 07/25/2017   Hx of fusion of cervical spine 07/25/2017   Cervical spinal stenosis 09/27/2016   Polypharmacy 06/23/2015   Hyperlipidemia 04/19/2015   Visit for preventive health examination 01/01/2015   Primary osteoarthritis involving multiple joints 01/01/2015   Bipolar affective disorder, currently depressed, moderate ([place])    Bipolar I disorder with mania ([place]) 08/17/2014   Hypertension 10/21/2013   Dizziness 03/25/2013   Medication withdrawal ([place]) 03/05/2013   Diabetes mellitus with renal manifestations, controlled ([place]) 11/10/2012   Alopecia areata 02/06/2012   Obesity (BMI 30-39.9) 02/06/2012   Renal insufficiency 07/16/2011   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    Exertional dyspnea 02/07/2011   Dyspnea on exertion 01/19/2011   DM (diabetes mellitus), type 2 ([place]) 04/22/2010   Carpal tunnel syndrome 02/02/2010   CONSTIPATION 02/02/2010   Primary osteoarthritis of both hands 02/02/2010   CATARACTS 04/13/2009   PAIN IN JOINT, ANKLE AND FOOT 09/16/2008   Eosinophilia 07/21/2008   AFFECTIVE DISORDER 04/21/2008   BACK PAIN, CHRONIC 02/12/2008   LEG PAIN 02/12/2008   ABNORMAL INVOLUNTARY MOVEMENTS 12/04/2007   POSTURAL LIGHTHEADEDNESS 11/04/2007   COLONIC POLYPS, HX OF 11/04/2007   Essential hypertension 06/17/2007   HYPERLIPIDEMIA 04/08/2007   MITRAL VALVE PROLAPSE 04/08/2007   GERD 04/08/2007   LOW BACK PAIN SYNDROME 04/08/2007   CHEST PAIN, RECURRENT 04/08/2007    Immunization History  Administered Date(s) Administered   Fluad Quad(high Dose 65+) 07/09/2019, 07/09/2020   H1N1 11/13/2008   Influenza Whole 07/21/2008, 07/28/2009, 06/28/2010, 07/05/2011, 09/22/2012   Influenza, High Dose Seasonal PF 07/05/2017, 07/14/2018   Influenza,inj,Quad PF,6+ Mos 07/11/2013, 07/03/2014, 06/23/2015, 06/29/2016   PFIZER Comirnaty(Gray Top)Covid-19 Tri-Sucrose Vaccine 02/02/2021  PFIZER(Purple Top)SARS-COV-2 Vaccination 11/17/2019, 12/08/2019, 08/04/2020    Pneumococcal Conjugate-13 12/19/2013   Pneumococcal Polysaccharide-23 02/25/2009, 04/19/2015   Td 02/02/2010   Tdap 04/25/2016    Conditions to be addressed/monitored:  Hypertension, Hyperlipidemia, Diabetes, GERD, COPD, Depression, Osteoarthritis and Raynaud's  Care Plan : [place]  Updates made by Viona Gilmore, [place] since 04/21/2021 12:00 AM     Problem: Problem: Hypertension, Hyperlipidemia, Diabetes, GERD, COPD, Depression, Osteoarthritis and Raynaud's      Long-Range Goal: Patient-Specific Goal   Start Date: 03/30/2021  Expected End Date: 03/30/2022  This Visit's Progress: On track  Priority: High  Note:   Current Barriers:  Unable to independently afford treatment regimen Unable to independently monitor therapeutic efficacy Suboptimal therapeutic regimen for prevention of heart events  Pharmacist Clinical Goal(s):  Patient will verbalize ability to afford treatment regimen achieve adherence to monitoring guidelines and medication adherence to achieve therapeutic efficacy achieve control of cholesterol as evidenced by next lipid panel  through collaboration with PharmD and provider.   Interventions: 1:1 collaboration with [name], [name], MD regarding development and update of comprehensive plan of care as evidenced by provider attestation and co-signature Inter-disciplinary care team collaboration (see longitudinal plan of care) Comprehensive medication review performed; medication list updated in electronic medical record  Hypertension (BP goal <130/80) -Controlled -Current treatment: Amlodipine 108m 1 tablet daily  Losartan 273m1 tablet daily  -Medications previously tried: furosemide  -Current home readings: not checking at home -Current dietary habits: did not discuss -Current exercise habits: no regular exercise -Denies hypotensive/hypertensive symptoms -Educated on Exercise goal of 150 minutes per week; Importance of home blood pressure  monitoring; Proper BP monitoring technique; Symptoms of hypotension and importance of maintaining adequate hydration; -Counseled to monitor BP at home weekly, document, and provide log at future appointments -Counseled on diet and exercise extensively Recommended to continue current medication Recommended purchasing an arm BP cuff to be able to monitor at home  Hyperlipidemia: (LDL goal < 70) -Uncontrolled -Current treatment: No medications -Medications previously tried: atorvastatin (on hold due to possible myalgias due to med), rosuvastatin (cost)  -Current dietary patterns: did not discuss -Current exercise habits: no regular exercise -Educated on Cholesterol goals;  Benefits of statin for ASCVD risk reduction; Exercise goal of 150 minutes per week; -Counseled on diet and exercise extensively Recommended high intensity statin therapy based on previous stroke  Raynaud's phenomenon (Goal: minimize symptoms) -Controlled -Current treatment  Amlodipine 48m51m tablet daily  Plaquenil 200 mg daily -Medications previously tried: none  -Recommended to continue current medication   Diabetes (A1c goal <7%) -Controlled -Current medications: No medications -Medications previously tried: Actos  -Current home glucose readings fasting glucose: does not check post prandial glucose: does not check -Denies hypoglycemic/hyperglycemic symptoms -Current meal patterns:  breakfast: did not discuss  lunch: did not discuss   dinner: did not discuss  snacks: did not discuss  drinks: did not discuss  -Current exercise: no regular exercise -Educated on Counseled to check feet daily and get yearly eye exams -Counseled to check feet daily and get yearly eye exams -Counseled on diet and exercise extensively  COPD (Goal: control symptoms and prevent exacerbations) -Controlled -Current treatment  Breztri 160-9-4.8 mcg/act 2 puffs into the lungs twice daily Albuterol nebulizer as  needed -Medications previously tried: n/a  -Gold Grade: Gold 2 (FEV1 50-79%) -Current COPD Classification:  B (high sx, <2 exacerbations/yr) -MMRC/CAT score: n/a -Pulmonary function testing: 2019 -Exacerbations requiring treatment in last 6 months: none -Patient reports consistent  use of maintenance inhaler -Frequency of rescue inhaler use: not often -Counseled on Differences between maintenance and rescue inhalers -Recommended to continue current medication  Depression/Bipolar depression (Goal: minimize symptoms) -Not ideally controlled -Current treatment: No medications -Medications previously tried/failed: Quetiapine, risperidone, lorazepam, temazepam, Lamotrigine, Duloxetine -PHQ9: 0 -Educated on Benefits of medication for symptom control -Recommended to follow up with behavioral health  GERD (Goal: minimize symptoms of heartburn/acid reflux) -Controlled -Current treatment  Famotidine 66m 1 tablet daily -Medications previously tried: Dexilant, Nexium, Prilosec, Protonix, Carafate  -Recommended to continue current medication  Constipation (Goal: regular bowel movements) -Uncontrolled -Current treatment  Linzess 1452m, 1 capsule once daily as needed for constipation -Medications previously tried: none  -Assessed patient finances. Patient would qualify for PAP but unsure of status. Will plan for CMA to follow up.  Osteoarthritis (Goal: minimize pain) -Controlled -Current treatment  Acetaminophen 50065m2 tablets every 6 hours as needed Diclofenac 1% gel, 2-4 grams to affected joints up to 4 times daily (uses 3x/ day)  -Medications previously tried: n/a  -Recommended to continue current medication  Allergic rhinitis (Goal: minimize symptoms of allergies) -Controlled -Current treatment  Montelukast 63m47mtablet at bedtime Cetirizine 63mg86mtablet at bedtime - hold off on taking for a few weeks Fluticasone nasal spray, 1 to 2 sprays into both nostrils at bedtime   -Medications previously tried: n/a  -Recommended to continue current medication   Health Maintenance -Vaccine gaps: shingrix -Current therapy:  Multivitamin (Centrum Silver), 1 tablet once daily Triamcinolone 0.1% 0.7%m, 1 application as needed for itching of affected areas Refresh repair eye drops- 3x/day Refresh Optive gel Ensure- 2-3x/ day  Ondasetron 4mg O9m1 tablet every 8 hours as needed -Educated on Cost vs benefit of each product must be carefully weighed by individual consumer -Patient is satisfied with current therapy and denies issues -Recommended to continue current medication  Patient Goals/Self-Care Activities Patient will:  - take medications as prescribed check blood pressure weekly, document, and provide at future appointments target a minimum of 150 minutes of moderate intensity exercise weekly  Follow Up Plan: Telephone follow up appointment with care management team member scheduled for: 6 months       Medication Assistance: Application for Linzess  medication assistance program. in process.  Anticipated assistance start date 05/21/21.  See plan of care for additional detail.  Compliance/Adherence/Medication fill history: Care Gaps: Shingrix, eye exam, DEXA, foot exam, pneumovax  Star-Rating Drugs: Losartan 25 mg - [facility]  [facility] [address]-[address]: [phone] [place] [address] [place] [address]: [phone] [facility] [address] [address] [place]-[address]: [phone] [facility] (Now  [facility]) - [place] [address] [address]: [phone] pill box? No - has own system Pt endorses 80% compliance  We discussed: Benefits of medication synchronization, packaging and delivery as well as enhanced pharmacist oversight with [facility]. Patient decided to: Continue current medication management strategy  Care Plan and Follow Up Patient Decision:  Patient agrees to Care Plan and  Follow-up.  Plan: Telephone follow up appointment with care management team member scheduled for:  6 months  [name], PharmD, [place] Pharmacist [place] at [place] [phone]

## 2021-04-11 ENCOUNTER — Telehealth: Payer: Self-pay

## 2021-04-11 NOTE — Telephone Encounter (Signed)
Patient advised she can start the PLQ. Patient advised she will need her PLQ eye exam sooner rather than later. Patient will call and advise when it is rescheduled for.

## 2021-04-11 NOTE — Telephone Encounter (Signed)
Patient called stating she just received a call from her eye doctor and was told her appointment has ben cancelled.  Patient states she was told they are short staffed and will call her back next week to reschedule the appointment.  Patient requested a return call to let her know if it is okay to continue taking the Hydroxychloroquine.

## 2021-04-18 ENCOUNTER — Other Ambulatory Visit: Payer: Self-pay | Admitting: Internal Medicine

## 2021-04-19 ENCOUNTER — Telehealth: Payer: Self-pay | Admitting: Internal Medicine

## 2021-04-19 NOTE — Telephone Encounter (Signed)
So stay isolated for 5 to 7 days if gets any symptoms get tested and let us know right away.

## 2021-04-19 NOTE — Telephone Encounter (Signed)
I spoke with the pt and she reported no symptoms of Covid infection. Patient stated she seen her brother 2 days ago.

## 2021-04-19 NOTE — Telephone Encounter (Signed)
The patients brother is in a nursing home with pneumonia and she went to see him Sunday. She gets a call today that he has COVID. The patient wants to know what does she need to do.  Please advise

## 2021-04-20 NOTE — Telephone Encounter (Signed)
Pt informed of the message below and verbalized understanding. 

## 2021-04-27 ENCOUNTER — Telehealth: Payer: Self-pay | Admitting: Pharmacist

## 2021-04-27 ENCOUNTER — Encounter: Payer: Medicare HMO | Admitting: Internal Medicine

## 2021-04-27 NOTE — Chronic Care Management (AMB) (Signed)
Chronic Care Management Pharmacy Assistant   Name: Desiree [name]  MRN: [id number] DOB: 06-15-1951  Spoke with Desiree [name] at [employer] patient assistance company per [name] the clinical pharmacists instructions to check on status of patients Desiree [name] application. Desiree [name] stated that patient wrote the wrong date when she signed for application. Patient dated Desiree [name] Desiree [name] instead of 2022. I had the application faxed to [phone] to ATTN: [name] to correct date on application. Message sent to [name] to make aware.  Called back to [employer] and spoke with [place] per the wrong fax number given. Cashmere states to just complete a new application and have patient resign and date. They only need page 4 of the application re signed and dated. Completed new application for Restasis and sent to Billee Cashing PTM to print and mail to patient per James Ivanoff instructions. Spoke with patient and made her aware to keep an eye out on the mail for the restasis application and the page 4 sheet of the Desiree [name] application. I informed patient to complete her part on the restasis application and then give to her eye doctor to finish completing. Patient knows to resign and date the page 4 sheet of her Desiree [name] application and then have it faxed back to [employer]. Patient thanked me for my call.   Joycelyn Das CMA  Clinical Pharmacist Assistant [phone]  Medications: Outpatient Encounter Medications as of 04/27/2021  Medication Sig Note   Accu-Chek Softclix Lancets lancets Use to test blood sugar daily. Dx:e11.9    Alcohol Swabs (B-D SINGLE USE SWABS REGULAR) PADS USE AS DIRECTED    amLODipine (NORVASC) 5 MG tablet TAKE ONE TABLET BY MOUTH EVERY MORNING    azelastine (ASTELIN) 0.1 % nasal spray Place 2 sprays into both nostrils 2 (two) times daily. Use in each nostril as directed    Budeson-Glycopyrrol-Formoterol (BREZTRI AEROSPHERE) 160-9-4.8 MCG/ACT AERO Inhale 2 puffs into  the lungs in the morning and at bedtime.    Carboxymethylcellulose Sodium (THERATEARS) 0.25 % SOLN Place 1 drop into both eyes 3 (three) times daily as needed (dry eyes). 12/08/2020: Dry eye Therapy    cetirizine (ZYRTEC) 10 MG tablet Take 10 mg by mouth at bedtime.     diclofenac sodium (VOLTAREN) 1 % GEL APPLY 2-4 GRAMS TO AFFECTED JOINTS UP TO FOUR TIMES DAILY (Patient taking differently: Apply 2-4 g topically 4 (four) times daily as needed (pain).)    famotidine (PEPCID) 40 MG tablet Take 1 tablet (40 mg total) by mouth daily.    feeding supplement, ENSURE ENLIVE, (ENSURE ENLIVE) LIQD Take 237 mLs by mouth 3 (three) times daily between meals.    fluticasone (FLONASE) 50 MCG/ACT nasal spray Place 2 sprays into both nostrils at bedtime.    hydroxychloroquine (PLAQUENIL) 200 MG tablet Take 1 tablet (200 mg total) by mouth daily.    linaclotide (Desiree [name]) 145 MCG CAPS capsule Take 145 mcg by mouth daily.    losartan (COZAAR) 25 MG tablet TAKE ONE TABLET BY MOUTH EVERY MORNING    montelukast (SINGULAIR) 10 MG tablet TAKE 1 TABLET(10 MG) BY MOUTH AT BEDTIME    Multiple Vitamins-Minerals (CENTRUM SILVER 50+WOMEN) TABS Take 1 tablet by mouth daily.    nitroGLYCERIN (NITRO-BID) 2 % ointment APPLY 1/4 INCH TOPICALLY TO AFFECTED FINGERTIPS 3 TIMES A DAY (Patient taking differently: Apply 0.25 inches topically 3 (three) times daily. APPLY 1/4 INCH TOPICALLY TO AFFECTED FINGERTIPS 3 TIMES A DAY)    ondansetron (ZOFRAN-ODT) 4 MG  disintegrating tablet DISSOLVE 1 TABLET(4 MG) ON THE TONGUE EVERY 8 HOURS AS NEEDED FOR NAUSEA OR VOMITING (Patient taking differently: Take 4 mg by mouth every 8 (eight) hours as needed for nausea or vomiting.)    Respiratory Therapy Supplies (FLUTTER) DEVI 1 Device by Does not apply route as directed.    RESTASIS 0.05 % ophthalmic emulsion Place 1 drop into both eyes 2 (two) times daily.    triamcinolone cream (KENALOG) 0.1 % Apply 1 application topically daily as needed (for itching  of affected areas).     No facility-administered encounter medications on file as of 04/27/2021.    Joycelyn Das CMA  Clinical Pharmacist Assistant [phone]

## 2021-05-17 ENCOUNTER — Other Ambulatory Visit: Payer: Self-pay | Admitting: Internal Medicine

## 2021-05-17 ENCOUNTER — Other Ambulatory Visit: Payer: Self-pay | Admitting: Physician Assistant

## 2021-05-18 ENCOUNTER — Telehealth: Payer: Self-pay

## 2021-05-18 NOTE — Telephone Encounter (Signed)
Patient called to let Sue Lush know that her eye exam is scheduled for Wednesday, 05/25/21 at 2:30 pm.

## 2021-05-20 ENCOUNTER — Other Ambulatory Visit: Payer: Self-pay | Admitting: Internal Medicine

## 2021-05-25 ENCOUNTER — Telehealth: Payer: Self-pay | Admitting: Pharmacist

## 2021-05-25 DIAGNOSIS — Z79899 Other long term (current) drug therapy: Secondary | ICD-10-CM | POA: Diagnosis not present

## 2021-05-25 DIAGNOSIS — H47013 Ischemic optic neuropathy, bilateral: Secondary | ICD-10-CM | POA: Diagnosis not present

## 2021-05-25 DIAGNOSIS — M0570 Rheumatoid arthritis with rheumatoid factor of unspecified site without organ or systems involvement: Secondary | ICD-10-CM | POA: Diagnosis not present

## 2021-05-25 DIAGNOSIS — H40023 Open angle with borderline findings, high risk, bilateral: Secondary | ICD-10-CM | POA: Diagnosis not present

## 2021-05-25 NOTE — Chronic Care Management (AMB) (Signed)
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 Discharged from [facility].     New?Medications Started at [facility] Discharge:?? -started the following medications due to surgery Aspirin EC 325 MG tablet. methocarbamol (ROBAXIN) 500 MG tablet Oxycodone-acetaminophen (PERCOCET/ROXICET) 5-325 MG tablet   Medications that remain the same after Hospital Discharge:?? -All other medications will remain the same.      Medications: Outpatient Encounter Medications as of 05/25/2021  Medication Sig Note   Accu-Chek Softclix Lancets lancets Use to test blood sugar daily. Dx:e11.9    Alcohol Swabs (B-D SINGLE USE SWABS REGULAR) PADS USE AS DIRECTED    amLODipine (NORVASC) 5 MG tablet TAKE 1 TABLET EVERY MORNING    azelastine (ASTELIN) 0.1 % nasal spray Place 2 sprays into both nostrils 2 (two) times daily. Use in each nostril as directed    Budeson-Glycopyrrol-Formoterol (BREZTRI AEROSPHERE) 160-9-4.8 MCG/ACT AERO Inhale 2 puffs into the lungs in the morning and at bedtime.    Carboxymethylcellulose Sodium (THERATEARS) 0.25 % SOLN Place 1 drop into both eyes 3 (three) times daily as needed (dry eyes). 12/08/2020: Dry eye Therapy    cetirizine (ZYRTEC) 10 MG tablet Take 10 mg by mouth at bedtime.     diclofenac sodium (VOLTAREN) 1 % GEL APPLY 2-4 GRAMS TO AFFECTED JOINTS UP TO FOUR TIMES DAILY (Patient taking differently: Apply 2-4 g topically 4 (four) times daily as needed (pain).)    famotidine (PEPCID) 40 MG tablet TAKE 1 TABLET (40 MG TOTAL) BY MOUTH DAILY.    feeding supplement, ENSURE ENLIVE, (ENSURE ENLIVE) LIQD Take 237  mLs by mouth 3 (three) times daily between meals.    fluticasone (FLONASE) 50 MCG/ACT nasal spray Place 2 sprays into both nostrils at bedtime.    hydroxychloroquine (PLAQUENIL) 200 MG tablet Take 1 tablet (200 mg total) by mouth daily.    linaclotide (LINZESS) 145 MCG CAPS capsule Take 145 mcg by mouth daily.    losartan (COZAAR) 25 MG tablet TAKE ONE TABLET BY MOUTH EVERY MORNING    montelukast (SINGULAIR) 10 MG tablet TAKE ONE TABLET BY MOUTH EVERYDAY AT BEDTIME    Multiple Vitamins-Minerals (CENTRUM SILVER 50+WOMEN) TABS Take 1 tablet by mouth daily.    nitroGLYCERIN (NITRO-BID) 2 % ointment APPLY 1/4 INCH TOPICALLY TO AFFECTED FINGERTIPS 3 TIMES A DAY (Patient taking differently: Apply 0.25 inches topically 3 (three) times daily. APPLY 1/4 INCH TOPICALLY TO AFFECTED FINGERTIPS 3 TIMES A DAY)    ondansetron (ZOFRAN-ODT) 4 MG disintegrating tablet DISSOLVE 1 TABLET(4 MG) ON THE TONGUE EVERY 8 HOURS AS NEEDED FOR NAUSEA OR VOMITING (Patient taking differently: Take 4 mg by mouth every 8 (eight) hours as needed for nausea or vomiting.)    Respiratory Therapy Supplies (FLUTTER) DEVI 1 Device by Does not apply route as directed.    RESTASIS 0.05 % ophthalmic emulsion Place 1 drop into both eyes 2 (two) times daily.    triamcinolone cream (KENALOG) 0.1 % Apply 1 application topically daily as needed (for itching of affected areas).     No facility-administered encounter medications on file as of 05/25/2021.   Reviewed chart prior to disease state call. Spoke with patient regarding  BP  Recent Office Vitals: BP Readings from Last 3 Encounters:  03/29/21 117/77  03/02/21 124/73  02/18/21 110/70   Pulse Readings from Last 3 Encounters:  03/29/21 99  03/02/21 83  02/18/21 100    Wt Readings from Last 3 Encounters:  03/29/21 163 lb (73.9 kg)  02/18/21 158 lb (71.7 kg)  01/28/21 162 lb (73.5 kg)     Kidney Function Lab Results  Component Value Date/Time   CREATININE 1.4 (A) 02/09/2021 12:00  AM   CREATININE 1.4 (A) 02/09/2021 12:00 AM   CREATININE 1.60 (H) 12/18/2020 01:29 AM   CREATININE 1.65 (H) 12/13/2020 03:29 PM   GFR 43.46 (L) 03/10/2020 11:00 AM   GFRNONAA 35 (L) 12/18/2020 01:29 AM   GFRAA 43 02/09/2021 12:00 AM    BMP Latest Ref Rng & Units 02/09/2021 02/09/2021 12/18/2020  Glucose 70 - 99 mg/dL - - 147(W)  BUN 8 - 23 mg/dL - - 19  Creatinine 0.5 - 1.1 1.4(A) 1.4(A) 1.60(H)  Sodium 137 - 147 - 140 134(L)  Potassium 3.4 - 5.3 - 4.7 4.2  Chloride 99 - 108 - 102 101  CO2 13 - 22 - 26(A) 26  Calcium 8.7 - 10.7 - 10.2 8.4(L)    Current antihypertensive regimen:  Amlodipine 5mg  1 tablet daily Losartan 25mg  1 tablet daily  Adherence Review: Is the patient currently on ACE/ARB medication? Yes Does the patient have >5 day gap between last estimated fill dates? No   Care Gaps:  CCM F/U Call - Scheduled 10-12-2021 3pm AWV  past 03-01-2021 - MSG sent to 10-14-2021 CMA to schedule. Zoster Vaccine - Overdue DEXA - Overdue Eye Exam - Overdue Foot Exam - Overdue PNA Vaccine - Overdue Flu vaccine - Overdue  Star Rating Drugs:  Losartan 25 mg - Last filled 03-23-2021 90DS at [facility]  Notes: 05-25-21 left VM 05-26-21 left VM 05-30-21 left VM  Unable to reach patient  12-29-2003 [facility] Clinical Pharmacist Assistant [phone]

## 2021-05-30 ENCOUNTER — Telehealth: Payer: Self-pay

## 2021-05-30 NOTE — Telephone Encounter (Signed)
Patient states she received a call from [facility] regarding her refill request for Plaquenil.  Patient states she had her Plaquenil eye exam last week at [facility] and was told they would fax results to Dr. Corliss Skains.  Patient requested a return call to confirm we received fax.

## 2021-05-30 NOTE — Telephone Encounter (Signed)
Attempted to contact the patient and left message to advise patient we have not received results from [facility] eye exam. Also advised patient it is a little to early to refill her prescription.

## 2021-05-31 DIAGNOSIS — H52223 Regular astigmatism, bilateral: Secondary | ICD-10-CM | POA: Diagnosis not present

## 2021-05-31 DIAGNOSIS — H524 Presbyopia: Secondary | ICD-10-CM | POA: Diagnosis not present

## 2021-06-01 ENCOUNTER — Telehealth: Payer: Self-pay | Admitting: Pulmonary Disease

## 2021-06-01 ENCOUNTER — Other Ambulatory Visit: Payer: Self-pay

## 2021-06-01 ENCOUNTER — Telehealth: Payer: Self-pay | Admitting: Pharmacist

## 2021-06-01 DIAGNOSIS — N958 Other specified menopausal and perimenopausal disorders: Secondary | ICD-10-CM | POA: Diagnosis not present

## 2021-06-01 LAB — HM DEXA SCAN: HM Dexa Scan: NORMAL

## 2021-06-01 MED ORDER — ONDANSETRON 4 MG PO TBDP: ORAL_TABLET | ORAL | 0 refills | Status: DC

## 2021-06-01 NOTE — Telephone Encounter (Signed)
Call returned to patient, confirmed patient information. Prior auth request for tier exception. Clinical questions answered.   Prior authorization has been started. Will contact office with decision via fax.   Nothing further needed at this time.

## 2021-06-01 NOTE — Chronic Care Management (AMB) (Signed)
Chronic Care Management Pharmacy Assistant   Name: Desiree [name]  MRN: [id number] DOB: 07-Jan-1951  Reason for Encounter: Medication Review/ Medication Coordination Call and Patient assistance documentation.    Recent office visits:  None  Recent consult visits:  None  Hospital visits:  None in previous 6 months  Medications: Outpatient Encounter Medications as of 06/01/2021  Medication Sig Note   Accu-Chek Softclix Lancets lancets Use to test blood sugar daily. Dx:e11.9    Alcohol Swabs (B-D SINGLE USE SWABS REGULAR) PADS USE AS DIRECTED    amLODipine (NORVASC) 5 MG tablet TAKE 1 TABLET EVERY MORNING    azelastine (ASTELIN) 0.1 % nasal spray Place 2 sprays into both nostrils 2 (two) times daily. Use in each nostril as directed    Budeson-Glycopyrrol-Formoterol (BREZTRI AEROSPHERE) 160-9-4.8 MCG/ACT AERO Inhale 2 puffs into the lungs in the morning and at bedtime.    Carboxymethylcellulose Sodium (THERATEARS) 0.25 % SOLN Place 1 drop into both eyes 3 (three) times daily as needed (dry eyes). 12/08/2020: Dry eye Therapy    cetirizine (ZYRTEC) 10 MG tablet Take 10 mg by mouth at bedtime.     diclofenac sodium (VOLTAREN) 1 % GEL APPLY 2-4 GRAMS TO AFFECTED JOINTS UP TO FOUR TIMES DAILY (Patient taking differently: Apply 2-4 g topically 4 (four) times daily as needed (pain).)    famotidine (PEPCID) 40 MG tablet TAKE 1 TABLET (40 MG TOTAL) BY MOUTH DAILY.    feeding supplement, ENSURE ENLIVE, (ENSURE ENLIVE) LIQD Take 237 mLs by mouth 3 (three) times daily between meals.    fluticasone (FLONASE) 50 MCG/ACT nasal spray Place 2 sprays into both nostrils at bedtime.    hydroxychloroquine (PLAQUENIL) 200 MG tablet Take 1 tablet (200 mg total) by mouth daily.    linaclotide (LINZESS) 145 MCG CAPS capsule Take 145 mcg by mouth daily.    losartan (COZAAR) 25 MG tablet TAKE ONE TABLET BY MOUTH EVERY MORNING    montelukast (SINGULAIR) 10 MG tablet TAKE ONE TABLET BY MOUTH EVERYDAY AT BEDTIME     Multiple Vitamins-Minerals (CENTRUM SILVER 50+WOMEN) TABS Take 1 tablet by mouth daily.    nitroGLYCERIN (NITRO-BID) 2 % ointment APPLY 1/4 INCH TOPICALLY TO AFFECTED FINGERTIPS 3 TIMES A DAY (Patient taking differently: Apply 0.25 inches topically 3 (three) times daily. APPLY 1/4 INCH TOPICALLY TO AFFECTED FINGERTIPS 3 TIMES A DAY)    ondansetron (ZOFRAN-ODT) 4 MG disintegrating tablet Sig: DISSOLVE 1 TABLET(4 MG) ON THE TONGUE EVERY 8 HOURS AS NEEDED FOR NAUSEA OR VOMITING    Respiratory Therapy Supplies (FLUTTER) DEVI 1 Device by Does not apply route as directed.    RESTASIS 0.05 % ophthalmic emulsion Place 1 drop into both eyes 2 (two) times daily.    triamcinolone cream (KENALOG) 0.1 % Apply 1 application topically daily as needed (for itching of affected areas).     No facility-administered encounter medications on file as of 06/01/2021.   Fill History: AZELASTINE HCL 137 MCG/SPRAY NA SOLN 03/31/2021 90   famotidine (PEPCID) tablet 03/23/2021 90   fluticasone (FLONASE) nasal spray 03/23/2021 90   hydroxychloroquine (PLAQUENIL) tablet 200 mg 03/30/2021 90   losartan (COZAAR) tablet 03/23/2021 90   methocarbamol (ROBAXIN) tablet 01/17/2021 15   montelukast (SINGULAIR) tablet 10 mg 05/08/2021 90   ondansetron (ZOFRAN-ODT) disintegrating tablet 04/05/2021 30   amLODIpine (NORVASC) tablet 03/17/2021 30   RESTASIS 0.05 % OP EMUL 04/14/2021 30   LINZESS 145 MCG PO CAPS 03/28/2021 30   oxyCODONE-acetaminophen  tablet 5-325 mg 12/22/2020 13  Reviewed chart for medication changes ahead of medication coordination call.  No OVs, Consults, or hospital visits since last care coordination call/Pharmacist visit. (If appropriate, list visit date, provider name)  No medication changes indicated OR if recent visit, treatment plan here.  BP Readings from Last 3 Encounters:  03/29/21 117/77  03/02/21 124/73  02/18/21 110/70    Lab Results  Component Value Date   HGBA1C 5.9 (H)  12/13/2020     Last adherence delivery included:  Amlodipine 5 mg; one tablet at breakfast Famotidine 40 mg; one tablet at breakfast Ondansetron 4 mg; PRN Cetirizine 10 mg; one tablet at Breakfast Losartan 25 mg; one tablet at breakfast Azelastine HCl 0.1 % 2 sprays Each Nare 2 times daily, use in each nostril as directed  Patient declined the following medications due to cost of linzess which patient states she will call when she can get that next month. She gets flonase from [employer]. Patient does not take Breztri inhaler at this time and patient has enough of everything else listed.  Sodium Chloride nebulizer 3% Centrum Silver 50+ Women's; one tablet at breakfast Aspirin 81 mg; one tablet at Breakfast Nitroglycerin 2% ointment; PRN Trazodone 50 mg; PRN Accu-check SmartView test strips     Budeson-Glycopyrrol-Formoterol 160-9-4.8 MCG/ACT 2 puffs Inhalation 2 times daily Carboxymethylcellulose Sodium 0.25 % 1 drop Both Eyes 3 times daily PRN Montelukast 10mg  - take 1 daily.  Fluticasone (FLONASE) 50 MCG/ACT nasal spray: 2 sprays in nostrils at night Linzess 145 mcg; one capsule   Explanation of abundance on hand (ie #30 due to overlapping fills or previous adherence issues etc)  Patient is due for next adherence delivery on: 06/13/21. Called patient and reviewed medications and coordinated delivery.  This delivery to include: Nothing. Patient is now with [employer]  Notes: Spoke with 06/15/21 at [employer] regarding patients linzess patient assistance application. Per [name] they need another application completed because the old one is void since it is dated from January from the provider. Will complete another application and print and mail for patient to complete as well. Spoke with patient and informed her that I will be printing and mailing her the linzess and restasis applications and for her to refill out her part of the applications and then take both to the pre scribers  offices and have them complete and fax them to the companies and also make her a copy of both to keep at home. Patient aware and and agreeable. Patient is no longer going to be using [facility]. She is now with [facility].   Care Gaps:  CCM F/U Call - Scheduled 10-12-2021 3pm AWV  past 03-01-2021 - MSG sent to 05-01-2021 CMA to schedule. Zoster Vaccine - Overdue DEXA - Overdue Eye Exam - Overdue Foot Exam - Overdue PNA Vaccine - Overdue Flu vaccine - Overdue  Star Rating Drugs:  Losartan 25 mg - Last filled 03-23-2021 90DS at [facility]  05-23-2021 CMA  Clinical Pharmacist Assistant [phone]

## 2021-06-02 ENCOUNTER — Telehealth: Payer: Self-pay | Admitting: Rheumatology

## 2021-06-02 NOTE — Telephone Encounter (Signed)
Attempted to contact the patient and left message to advise patient we have received her PLQ eye exam. Advised we sent her prescription to the pharmacy for a 90 day supply in June and it is too early to refill at this time.

## 2021-06-02 NOTE — Telephone Encounter (Signed)
Patient calling to see if we received eye exam report from Dr. Harlon Flor. Patient needs refill on medication.

## 2021-06-13 ENCOUNTER — Other Ambulatory Visit: Payer: Self-pay | Admitting: Internal Medicine

## 2021-06-21 ENCOUNTER — Other Ambulatory Visit: Payer: Self-pay
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
## 2021-06-22 ENCOUNTER — Encounter: Payer: Self-pay | Admitting: Internal Medicine

## 2021-06-22 ENCOUNTER — Ambulatory Visit (INDEPENDENT_AMBULATORY_CARE_PROVIDER_SITE_OTHER): Payer: Medicare HMO | Admitting: Internal Medicine

## 2021-06-22 VITALS — BP 128/66 | HR 93 | Temp 99.3°F | Ht 62.5 in | Wt 163.2 lb

## 2021-06-22 DIAGNOSIS — M069 Rheumatoid arthritis, unspecified: Secondary | ICD-10-CM

## 2021-06-22 DIAGNOSIS — Z0000 Encounter for general adult medical examination without abnormal findings: Secondary | ICD-10-CM

## 2021-06-22 DIAGNOSIS — I7301 Raynaud's syndrome with gangrene: Secondary | ICD-10-CM

## 2021-06-22 DIAGNOSIS — J449 Chronic obstructive pulmonary disease, unspecified: Secondary | ICD-10-CM

## 2021-06-22 DIAGNOSIS — Z79899 Other long term (current) drug therapy: Secondary | ICD-10-CM | POA: Diagnosis not present

## 2021-06-22 DIAGNOSIS — E1169 Type 2 diabetes mellitus with other specified complication: Secondary | ICD-10-CM

## 2021-06-22 DIAGNOSIS — Z634 Disappearance and death of family member: Secondary | ICD-10-CM

## 2021-06-22 DIAGNOSIS — I10 Essential (primary) hypertension: Secondary | ICD-10-CM

## 2021-06-22 DIAGNOSIS — N183 Chronic kidney disease, stage 3 unspecified: Secondary | ICD-10-CM

## 2021-06-22 LAB — HEPATIC FUNCTION PANEL
ALT: 16 U/L (ref 0–35)
AST: 28 U/L (ref 0–37)
Albumin: 4.2 g/dL (ref 3.5–5.2)
Alkaline Phosphatase: 70 U/L (ref 39–117)
Bilirubin, Direct: 0.1 mg/dL (ref 0.0–0.3)
Total Bilirubin: 0.5 mg/dL (ref 0.2–1.2)
Total Protein: 7.7 g/dL (ref 6.0–8.3)

## 2021-06-22 LAB — CBC WITH DIFFERENTIAL/PLATELET
Basophils Absolute: 0.1 10*3/uL (ref 0.0–0.1)
Basophils Relative: 0.9 % (ref 0.0–3.0)
Eosinophils Absolute: 0.6 10*3/uL (ref 0.0–0.7)
Eosinophils Relative: 6.5 % — ABNORMAL HIGH (ref 0.0–5.0)
HCT: 39.6 % (ref 36.0–46.0)
Hemoglobin: 13.1 g/dL (ref 12.0–15.0)
Lymphocytes Relative: 33.5 % (ref 12.0–46.0)
Lymphs Abs: 3 10*3/uL (ref 0.7–4.0)
MCHC: 33 g/dL (ref 30.0–36.0)
MCV: 90.6 fl (ref 78.0–100.0)
Monocytes Absolute: 0.7 10*3/uL (ref 0.1–1.0)
Monocytes Relative: 7.5 % (ref 3.0–12.0)
Neutro Abs: 4.6 10*3/uL (ref 1.4–7.7)
Neutrophils Relative %: 51.6 % (ref 43.0–77.0)
Platelets: 280 10*3/uL (ref 150.0–400.0)
RBC: 4.37 Mil/uL (ref 3.87–5.11)
RDW: 15 % (ref 11.5–15.5)
WBC: 9 10*3/uL (ref 4.0–10.5)

## 2021-06-22 LAB — LIPID PANEL
Cholesterol: 203 mg/dL — ABNORMAL HIGH (ref 0–200)
HDL: 55.9 mg/dL (ref >39.00)
LDL Cholesterol: 125 mg/dL — ABNORMAL HIGH (ref 0–99)
NonHDL: 147.19
Total CHOL/HDL Ratio: 4
Triglycerides: 111 mg/dL (ref 0.0–149.0)
VLDL: 22.2 mg/dL (ref 0.0–40.0)

## 2021-06-22 LAB — BASIC METABOLIC PANEL
BUN: 19 mg/dL (ref 6–23)
CO2: 28 mEq/L (ref 19–32)
Calcium: 10 mg/dL (ref 8.4–10.5)
Chloride: 102 mEq/L (ref 96–112)
Creatinine, Ser: 1.71 mg/dL — ABNORMAL HIGH (ref 0.40–1.20)
GFR: 30.26 mL/min — ABNORMAL LOW (ref >60.00)
Glucose, Bld: 72 mg/dL (ref 70–99)
Potassium: 4.2 mEq/L (ref 3.5–5.1)
Sodium: 138 mEq/L (ref 135–145)

## 2021-06-22 LAB — MICROALBUMIN / CREATININE URINE RATIO
Creatinine,U: 169.9 mg/dL
Microalb Creat Ratio: 12.9 mg/g (ref 0.0–30.0)
Microalb, Ur: 22 mg/dL — ABNORMAL HIGH (ref 0.0–1.9)

## 2021-06-22 LAB — HEMOGLOBIN A1C: Hgb A1c MFr Bld: 6.1 % (ref 4.6–6.5)

## 2021-06-22 LAB — TSH: TSH: 1.71 u[IU]/mL (ref 0.35–5.50)

## 2021-06-22 MED ORDER — ONDANSETRON 4 MG PO TBDP: ORAL_TABLET | ORAL | 1 refills | Status: DC

## 2021-06-22 NOTE — Patient Instructions (Addendum)
Cholesterol and blood sugar monitoring is due.   Consideration for adding cholesterol medication for risk reduction.   I will refill zofran dissolvable for now .   Shingles vaccine  to be given at pharmacy to be paid  for  .   Condolences on loss of your  niece and cousin.  Local foot care .  Can get lab today  or come back . Plan follow up depending on results   and 4-6 months .  Health Maintenance, Female Adopting a healthy lifestyle and getting preventive care are important in promoting health and wellness. Ask your health care provider about: The right schedule for you to have regular tests and exams. Things you can do on your own to prevent diseases and keep yourself healthy. What should I know about diet, weight, and exercise? Eat a healthy diet  Eat a diet that includes plenty of vegetables, fruits, low-fat dairy products, and lean protein. Do not eat a lot of foods that are high in solid fats, added sugars, or sodium. Maintain a healthy weight Body mass index (BMI) is used to identify weight problems. It estimates body fat based on height and weight. Your health care provider can help determine your BMI and help you achieve or maintain a healthy weight. Get regular exercise Get regular exercise. This is one of the most important things you can do for your health. Most adults should: Exercise for at least 150 minutes each week. The exercise should increase your heart rate and make you sweat (moderate-intensity exercise). Do strengthening exercises at least twice a week. This is in addition to the moderate-intensity exercise. Spend less time sitting. Even light physical activity can be beneficial. Watch cholesterol and blood lipids Have your blood tested for lipids and cholesterol at 69 years of age, then have this test every 5 years. Have your cholesterol levels checked more often if: Your lipid or cholesterol levels are high. You are older than 69 years of age. You are at  high risk for heart disease. What should I know about cancer screening? Depending on your health history and family history, you may need to have cancer screening at various ages. This may include screening for: Breast cancer. Cervical cancer. Colorectal cancer. Skin cancer. Lung cancer. What should I know about heart disease, diabetes, and high blood pressure? Blood pressure and heart disease High blood pressure causes heart disease and increases the risk of stroke. This is more likely to develop in people who have high blood pressure readings, are of African descent, or are overweight. Have your blood pressure checked: Every 3-5 years if you are 61-71 years of age. Every year if you are 61 years old or older. Diabetes Have regular diabetes screenings. This checks your fasting blood sugar level. Have the screening done: Once every three years after age 26 if you are at a normal weight and have a low risk for diabetes. More often and at a younger age if you are overweight or have a high risk for diabetes. What should I know about preventing infection? Hepatitis B If you have a higher risk for hepatitis B, you should be screened for this virus. Talk with your health care provider to find out if you are at risk for hepatitis B infection. Hepatitis C Testing is recommended for: Everyone born from [birth year] through 1964. Anyone with known risk factors for hepatitis C. Sexually transmitted infections (STIs) Get screened for STIs, including gonorrhea and chlamydia, if: You are sexually active and are younger  than 69 years of age. You are older than 69 years of age and your health care provider tells you that you are at risk for this type of infection. Your sexual activity has changed since you were last screened, and you are at increased risk for chlamydia or gonorrhea. Ask your health care provider if you are at risk. Ask your health care provider about whether you are at high risk for HIV. Your  health care provider may recommend a prescription medicine to help prevent HIV infection. If you choose to take medicine to prevent HIV, you should first get tested for HIV. You should then be tested every 3 months for as long as you are taking the medicine. Pregnancy If you are about to stop having your period (premenopausal) and you may become pregnant, seek counseling before you get pregnant. Take 400 to 800 micrograms (mcg) of folic acid every day if you become pregnant. Ask for birth control (contraception) if you want to prevent pregnancy. Osteoporosis and menopause Osteoporosis is a disease in which the bones lose minerals and strength with aging. This can result in bone fractures. If you are 76 years old or older, or if you are at risk for osteoporosis and fractures, ask your health care provider if you should: Be screened for bone loss. Take a calcium or vitamin D supplement to lower your risk of fractures. Be given hormone replacement therapy (HRT) to treat symptoms of menopause. Follow these instructions at home: Lifestyle Do not use any products that contain nicotine or tobacco, such as cigarettes, e-cigarettes, and chewing tobacco. If you need help quitting, ask your health care provider. Do not use street drugs. Do not share needles. Ask your health care provider for help if you need support or information about quitting drugs. Alcohol use Do not drink alcohol if: Your health care provider tells you not to drink. You are pregnant, may be pregnant, or are planning to become pregnant. If you drink alcohol: Limit how much you use to 0-1 drink a day. Limit intake if you are breastfeeding. Be aware of how much alcohol is in your drink. In the [place] one drink equals one 12 oz bottle of beer (355 mL), one 5 oz glass of wine (148 mL), or one 1 oz glass of hard liquor (44 mL). General instructions Schedule regular health, dental, and eye exams. Stay current with your vaccines. Tell  your health care provider if: You often feel depressed. You have ever been abused or do not feel safe at home. Summary Adopting a healthy lifestyle and getting preventive care are important in promoting health and wellness. Follow your health care provider's instructions about healthy diet, exercising, and getting tested or screened for diseases. Follow your health care provider's instructions on monitoring your cholesterol and blood pressure. This information is not intended to replace advice given to you by your health care provider. Make sure you discuss any questions you have with your health care provider. Document Revised: 12/17/2020 Document Reviewed: 10/02/2018 Elsevier Patient Education  2022 [employer].

## 2021-06-23 ENCOUNTER — Telehealth: Payer: Self-pay | Admitting: Pharmacist

## 2021-06-23 NOTE — Progress Notes (Signed)
Chronic Care Management Pharmacy Assistant   Name: ANABELLE BUNGERT  MRN: [id number] DOB: Aug 23, 1951  Per Nida Boatman followed up with patient regarding Patient assistance for Linzess and Restasis, Call to My [employer] Assist and My Way spoke to [place] she advised there was no information on file for the above patient.  Call to patient she reports she still has yet to receive the applications   Reprinting and will Re-mail the applications with instructions for Completion of her portion.Patient is then to take Linzess paperwork to Dr Jolee Ewing [facility] office and Restasis paperwork to Dr Minna Merritts office for completion and to be sent to My [employer]  Patient in agreement  Medications: Outpatient Encounter Medications as of 06/23/2021  Medication Sig Note   Accu-Chek Softclix Lancets lancets Use to test blood sugar daily. Dx:e11.9    Alcohol Swabs (B-D SINGLE USE SWABS REGULAR) PADS USE AS DIRECTED    amLODipine (NORVASC) 5 MG tablet TAKE 1 TABLET EVERY MORNING    azelastine (ASTELIN) 0.1 % nasal spray Place 2 sprays into both nostrils 2 (two) times daily. Use in each nostril as directed    Budeson-Glycopyrrol-Formoterol (BREZTRI AEROSPHERE) 160-9-4.8 MCG/ACT AERO Inhale 2 puffs into the lungs in the morning and at bedtime.    Carboxymethylcellulose Sodium (THERATEARS) 0.25 % SOLN Place 1 drop into both eyes 3 (three) times daily as needed (dry eyes). 12/08/2020: Dry eye Therapy    cetirizine (ZYRTEC) 10 MG tablet Take 10 mg by mouth at bedtime.     diclofenac sodium (VOLTAREN) 1 % GEL APPLY 2-4 GRAMS TO AFFECTED JOINTS UP TO FOUR TIMES DAILY (Patient taking differently: Apply 2-4 g topically 4 (four) times daily as needed (pain).)    famotidine (PEPCID) 40 MG tablet TAKE 1 TABLET (40 MG TOTAL) BY MOUTH DAILY.    feeding supplement, ENSURE ENLIVE, (ENSURE ENLIVE) LIQD Take 237 mLs by mouth 3 (three) times daily between meals.    fluticasone (FLONASE) 50 MCG/ACT nasal spray Place 2 sprays into  both nostrils at bedtime.    hydroxychloroquine (PLAQUENIL) 200 MG tablet Take 1 tablet (200 mg total) by mouth daily.    linaclotide (LINZESS) 145 MCG CAPS capsule Take 145 mcg by mouth daily.    losartan (COZAAR) 25 MG tablet TAKE ONE TABLET BY MOUTH EVERY MORNING    montelukast (SINGULAIR) 10 MG tablet TAKE ONE TABLET BY MOUTH EVERYDAY AT BEDTIME    Multiple Vitamins-Minerals (CENTRUM SILVER 50+WOMEN) TABS Take 1 tablet by mouth daily.    nitroGLYCERIN (NITRO-BID) 2 % ointment APPLY 1/4 INCH TOPICALLY TO AFFECTED FINGERTIPS 3 TIMES A DAY (Patient taking differently: Apply 0.25 inches topically 3 (three) times daily. APPLY 1/4 INCH TOPICALLY TO AFFECTED FINGERTIPS 3 TIMES A DAY)    ondansetron (ZOFRAN-ODT) 4 MG disintegrating tablet Sig: DISSOLVE 1 TABLET(4 MG) ON THE TONGUE EVERY 8 HOURS AS NEEDED FOR NAUSEA OR VOMITING    Respiratory Therapy Supplies (FLUTTER) DEVI 1 Device by Does not apply route as directed.    RESTASIS 0.05 % ophthalmic emulsion Place 1 drop into both eyes 2 (two) times daily.    triamcinolone cream (KENALOG) 0.1 % Apply 1 application topically daily as needed (for itching of affected areas).     No facility-administered encounter medications on file as of 06/23/2021.    Care Gaps: CCM F/U Call - Scheduled 10-12-2021 3 pm AWV  past 03-01-2021 - MSG sent to Carrie Mew CMA to schedule. Zoster Vaccine - Overdue DEXA - Overdue Eye Exam - Overdue Foot Exam -  Overdue PNA Vaccine - Overdue Flu vaccine - Overdue  Star Rating Drugs: Losartan 25 mg - Last filled 06-13-2021 90 DS at [facility]  Pamala Duffel CMA Clinical Pharmacist Assistant [phone]

## 2021-06-30 ENCOUNTER — Telehealth: Payer: Self-pay | Admitting: Pulmonary Disease

## 2021-06-30 NOTE — Telephone Encounter (Signed)
I have called the pt and LM on VM for her.  I advised her since she is in the donut hole that no medication is going to be cheaper.  I advised her to call the insurance company to see what other meds they will cover.  And then she can call us back.

## 2021-07-01 NOTE — Telephone Encounter (Signed)
Astelin is no OTC  LMTCB for the pt

## 2021-07-01 NOTE — Telephone Encounter (Signed)
Spoke with the pt and notified she can get astelin OTC  She verbalized understanding Nothing further needed

## 2021-07-01 NOTE — Telephone Encounter (Signed)
Patient is returning phone call. Patient phone number is [phone]. 

## 2021-07-04 ENCOUNTER — Telehealth: Payer: Self-pay

## 2021-07-04 NOTE — Telephone Encounter (Signed)
Patient called requesting lab results and she would also like to know when she can get her flu shot.

## 2021-07-04 NOTE — Telephone Encounter (Signed)
Pt informed that she will be contacted in regards to results once they are reviewed by PCP. Pt informed to schedule nurse visit for Flu shot injection.

## 2021-07-05 MED ORDER — PRAVASTATIN SODIUM 40 MG PO TABS
40.0000 mg | ORAL_TABLET | Freq: Every day | ORAL | 1 refills | Status: DC
Start: 2021-07-05 — End: 2021-12-05

## 2021-07-05 NOTE — Progress Notes (Signed)
So creatinine is up a bit which means kidney function looks a bit worse but there is no protein in your urine.  Which is favorable. Diabetes in control Cholesterol borderline elevated but because of your history  and dm not at goal. I did a record review and appears you have been on atorvastatin and rosuvastatin in the past and may have had a side effect but it is unclear if you would tolerate a lower dose of these medications.  We can also try a different one in the class. Please send in pravastatin 40 mg  disp 90 refill x1 take by mouth once a day.  If you have any concerns about taking this can go to 3 days a week.eep [place] updated   Recheck lipid panel and BMP in 2 to 3 months. And then   rov depending on results and how doing  Sending information to Dr. Allena Katz also

## 2021-07-06 ENCOUNTER — Telehealth (INDEPENDENT_AMBULATORY_CARE_PROVIDER_SITE_OTHER): Payer: Medicare HMO | Admitting: Internal Medicine

## 2021-07-06 ENCOUNTER — Telehealth: Payer: Self-pay | Admitting: Internal Medicine

## 2021-07-06 ENCOUNTER — Encounter: Payer: Self-pay | Admitting: Internal Medicine

## 2021-07-06 VITALS — Ht 62.5 in | Wt 163.2 lb

## 2021-07-06 DIAGNOSIS — Z79899 Other long term (current) drug therapy: Secondary | ICD-10-CM | POA: Diagnosis not present

## 2021-07-06 DIAGNOSIS — E785 Hyperlipidemia, unspecified: Secondary | ICD-10-CM | POA: Diagnosis not present

## 2021-07-06 DIAGNOSIS — N183 Chronic kidney disease, stage 3 unspecified: Secondary | ICD-10-CM | POA: Diagnosis not present

## 2021-07-06 DIAGNOSIS — E1122 Type 2 diabetes mellitus with diabetic chronic kidney disease: Secondary | ICD-10-CM

## 2021-07-06 NOTE — Telephone Encounter (Signed)
Pt informed of the message and verbalized understanding  

## 2021-07-06 NOTE — Progress Notes (Signed)
Virtual Visit via Telephone Note  I connected with@ on 07/06/21 at  9:30 AM EDT by telephone and verified that I am speaking with the correct person using two identifiers.   I discussed the limitations, risks, security and privacy concerns of performing an evaluation and management service by telephone and the limited availability of in person appointments. tThere may be a patient responsible charge related to this service. The patient expressed understanding and agreed to proceed.  Location patient: home Location provider: work office Participants present for the call: patient, provider Patient did not have a visit in the prior 7 days to address this/these issue(s).   History of Present Illness: Desiree [name] presents to discuss the findings of her lab results. See result note. Is not on any new medicines except the hydroxychloroquine and has been okay.  Concerned that her creatinine was up does not have a follow-up visit except for yearly with Dr. Allena Katz  Her memory is that she went off the cholesterol medicine one of them because her lipid levels were good and wanted to decrease pill count.   Observations/Objective: Patient sounds personable and well on the phone. I do not appreciate any SOB. Speech and thought processing are grossly intact. Patient reported vitals: Lab Results  Component Value Date   WBC 9.0 06/22/2021   HGB 13.1 06/22/2021   HCT 39.6 06/22/2021   PLT 280.0 06/22/2021   GLUCOSE 72 06/22/2021   CHOL 203 (H) 06/22/2021   TRIG 111.0 06/22/2021   HDL 55.90 06/22/2021   LDLDIRECT 146.3 01/26/2010   LDLCALC 125 (H) 06/22/2021   ALT 16 06/22/2021   AST 28 06/22/2021   NA 138 06/22/2021   K 4.2 06/22/2021   CL 102 06/22/2021   CREATININE 1.71 (H) 06/22/2021   BUN 19 06/22/2021   CO2 28 06/22/2021   TSH 1.71 06/22/2021   INR 1.16 10/09/2018   HGBA1C 6.1 06/22/2021   MICROALBUR 22.0 (H) 06/22/2021    Assessment and Plan: Hyperlipidemia, unspecified  hyperlipidemia type - ldl125 goal 70 or at least below 100   Stage 3 chronic kidney disease, unspecified whether stage 3a or 3b CKD (HCC)  Medication management  Controlled type 2 diabetes mellitus with chronic kidney disease, without long-term current use of insulin, unspecified CKD stage (HCC)   Follow Up Instructions: Creatinine back up to 1.7 was in this range 8 months to a year ago rest of labs including urine protein microalbumin screen is unrevealing or stable Original plan was to follow-up lab in 2 to 3 months with lipid panel and BMP Will reach out to Dr. Allena Katz for further advice need for follow-up plan She is quite concerned about her renal function range. She will try pravastatin daily or as tolerated and plan for her follow-up lipid panel in 2 to 3 months as previously stated.  She has avoided nsaid and cox2  [id number] 5-10 [id number] 11-20 [id number] 21-30 I did not refer this patient for an OV in the next 24 hours for this/these issue(s).  I discussed the assessment and treatment plan with the patient. The patient was provided an opportunity to ask questions and answered. The patient agreed with the plan and demonstrated an understanding of the instructions.   The patient was advised to call back or seek an in-person evaluation if the symptoms worsen or if the condition fails to improve as anticipated.  I provided 21 minutes of non-face-to-face time during this encounter. Return for as planned lab in 2-3 months or as  indicated .  Berniece Andreas, MD

## 2021-07-06 NOTE — Telephone Encounter (Signed)
I am not aware if it is out yet.

## 2021-07-06 NOTE — Telephone Encounter (Signed)
PT called to see if she needs to get that new covid shot that is out now. If so where can she get it at that will be covered by insurance. Please advise.

## 2021-07-11 ENCOUNTER — Ambulatory Visit (INDEPENDENT_AMBULATORY_CARE_PROVIDER_SITE_OTHER): Payer: Medicare HMO

## 2021-07-11 ENCOUNTER — Other Ambulatory Visit: Payer: Self-pay | Admitting: Internal Medicine

## 2021-07-11 ENCOUNTER — Other Ambulatory Visit: Payer: Self-pay

## 2021-07-11 DIAGNOSIS — Z23 Encounter for immunization: Secondary | ICD-10-CM | POA: Diagnosis not present

## 2021-07-12 ENCOUNTER — Other Ambulatory Visit: Payer: Self-pay | Admitting: Internal Medicine

## 2021-07-13 DIAGNOSIS — N183 Chronic kidney disease, stage 3 unspecified: Secondary | ICD-10-CM | POA: Diagnosis not present

## 2021-07-21 ENCOUNTER — Telehealth: Payer: Self-pay

## 2021-07-21 NOTE — Telephone Encounter (Signed)
Patient called asking were she should go to get her Covid booster vaccine, pt said it is ok to leave voicemail

## 2021-08-02 ENCOUNTER — Telehealth: Payer: Self-pay | Admitting: Pharmacist

## 2021-08-02 NOTE — Chronic Care Management (AMB) (Signed)
Chronic Care Management Pharmacy Assistant   Name: Desiree [name]  MRN: [id number] DOB: 1951/08/20  Reason for Encounter: Disease State Hyperlipidemia Assessment Call    Conditions to be addressed/monitored: HLD  Recent office visits:  07/06/2021 [name], Neta Mends, MD - Patient presented via tele-health for Hyperlipidemia and other concerns. No medication changes.  Recent consult visits:  None  Hospital visits:  None in previous 6 months  Medications: Outpatient Encounter Medications as of 08/02/2021  Medication Sig Note   Accu-Chek Softclix Lancets lancets Use to test blood sugar daily. Dx:e11.9    Alcohol Swabs (B-D SINGLE USE SWABS REGULAR) PADS USE AS DIRECTED    amLODipine (NORVASC) 5 MG tablet TAKE 1 TABLET EVERY MORNING    azelastine (ASTELIN) 0.1 % nasal spray Place 2 sprays into both nostrils 2 (two) times daily. Use in each nostril as directed    Budeson-Glycopyrrol-Formoterol (BREZTRI AEROSPHERE) 160-9-4.8 MCG/ACT AERO Inhale 2 puffs into the lungs in the morning and at bedtime.    Carboxymethylcellulose Sodium (THERATEARS) 0.25 % SOLN Place 1 drop into both eyes 3 (three) times daily as needed (dry eyes). 12/08/2020: Dry eye Therapy    cetirizine (ZYRTEC) 10 MG tablet Take 10 mg by mouth at bedtime.     diclofenac sodium (VOLTAREN) 1 % GEL APPLY 2-4 GRAMS TO AFFECTED JOINTS UP TO FOUR TIMES DAILY (Patient taking differently: Apply 2-4 g topically 4 (four) times daily as needed (pain).)    famotidine (PEPCID) 40 MG tablet TAKE 1 TABLET (40 MG TOTAL) BY MOUTH DAILY.    feeding supplement, ENSURE ENLIVE, (ENSURE ENLIVE) LIQD Take 237 mLs by mouth 3 (three) times daily between meals.    fluticasone (FLONASE) 50 MCG/ACT nasal spray Place 2 sprays into both nostrils at bedtime.    hydroxychloroquine (PLAQUENIL) 200 MG tablet Take 1 tablet (200 mg total) by mouth daily.    linaclotide (LINZESS) 145 MCG CAPS capsule Take 145 mcg by mouth daily.    losartan (COZAAR) 25 MG tablet  TAKE ONE TABLET BY MOUTH EVERY MORNING    montelukast (SINGULAIR) 10 MG tablet TAKE 1 TABLET BY MOUTH AT BEDTIME    Multiple Vitamins-Minerals (CENTRUM SILVER 50+WOMEN) TABS Take 1 tablet by mouth daily.    nitroGLYCERIN (NITRO-BID) 2 % ointment APPLY 1/4 INCH TOPICALLY TO AFFECTED FINGERTIPS 3 TIMES A DAY (Patient taking differently: Apply 0.25 inches topically 3 (three) times daily. APPLY 1/4 INCH TOPICALLY TO AFFECTED FINGERTIPS 3 TIMES A DAY)    ondansetron (ZOFRAN-ODT) 4 MG disintegrating tablet DISSOLVE 1 TABLET ON THE TONGUE EVERY 8 HOURS AS NEEDED FOR NAUSEA OR VOMITING    pravastatin (PRAVACHOL) 40 MG tablet Take 1 tablet (40 mg total) by mouth daily.    Respiratory Therapy Supplies (FLUTTER) DEVI 1 Device by Does not apply route as directed.    RESTASIS 0.05 % ophthalmic emulsion Place 1 drop into both eyes 2 (two) times daily.    triamcinolone cream (KENALOG) 0.1 % Apply 1 application topically daily as needed (for itching of affected areas).     No facility-administered encounter medications on file as of 08/02/2021.  08/02/2021 Name: Desiree [name] MRN: [id number] DOB: 06-Nov-1950 Desiree [name] is a 70 y.o. year old female who is a primary care patient of [name], Neta Mends, MD.  Comprehensive medication review performed; Spoke to patient regarding cholesterol  Lipid Panel    Component Value Date/Time   CHOL 203 (H) 06/22/2021 1352   TRIG 111.0 06/22/2021 1352   HDL 55.90 06/22/2021 1352  LDLCALC 125 (H) 06/22/2021 1352   LDLDIRECT 146.3 01/26/2010 0000    10-year ASCVD risk score: The ASCVD Risk score (Arnett DK, et al., 2019) failed to calculate for the following reasons:   The patient has a prior MI or stroke diagnosis  Current antihyperlipidemic regimen:  No medications   Adherence Review: Does the patient have >5 day gap between last estimated fill dates? No   Notes: Called to follow up on patient assistance paperwork for linzess and restasis. My [employer] state that the  applications need new prescriptions. Unable to confirm receipt, completion and delivery of paperwork with patient. Unable to reach for monthly call as well.   Care Gaps: CCM F/U Call - Scheduled 10-12-2021 3 pm AWV  past 03-01-2021  Zoster Vaccine - Overdue DEXA - Overdue Eye Exam - Overdue Foot Exam - Overdue PNA Vaccine - Overdue Flu vaccine - Overdue BP- 128/66 Lab Results  Component Value Date   HGBA1C 6.1 06/22/2021    Star Rating Drugs: Losartan 25 mg - Last filled 06-13-2021 90 DS at [facility]   08/08/21 attempted to reach patient left voice mail  08/16/21 attempted to reach patient 10/26 unable to reach Pamala Duffel [facility] Clinical Pharmacist Assistant [phone]

## 2021-08-05 NOTE — Telephone Encounter (Signed)
Pt informed of locations to receive covid booster.

## 2021-08-08 ENCOUNTER — Other Ambulatory Visit: Payer: Self-pay

## 2021-08-08 ENCOUNTER — Other Ambulatory Visit: Payer: Self-pay | Admitting: Physician Assistant

## 2021-08-08 DIAGNOSIS — Z23 Encounter for immunization: Secondary | ICD-10-CM | POA: Diagnosis not present

## 2021-08-08 NOTE — Telephone Encounter (Signed)
Next Visit: 09/29/2021  Last Visit: 03/29/2021  Labs: 06/22/2021 creatinine 1.71, GFR 30.26, eosinophils relative 6.5.  Eye exam: 05/25/2021   Current Dose per office note on 03/29/2021: Plaquenil 200 mg once daily.    DX: Rheumatoid arthritis with rheumatoid factor of multiple sites without organ or systems involvement   Last Fill: 03/29/2021  Okay to refill Plaquenil?

## 2021-08-08 NOTE — Telephone Encounter (Signed)
Patient called requesting prescription refill of Plaquenil.  Patient states she was told to contact the office because she had no refills remaining.

## 2021-08-09 MED ORDER — HYDROXYCHLOROQUINE SULFATE 200 MG PO TABS: 200.0000 mg | ORAL_TABLET | Freq: Every day | ORAL | 0 refills | Status: DC

## 2021-08-09 NOTE — Telephone Encounter (Signed)
Next Visit: 09/29/2021  Last Visit: 03/29/2021  Labs: 06/22/2021 creatinine 1.71, GFR 30.26, eosinophils relative 6.5.  Eye exam: 05/25/2021   Current Dose per office note on 03/29/2021: Plaquenil 200 mg once daily.    DX: Rheumatoid arthritis with rheumatoid factor of multiple sites without organ or systems involvement   Last Fill: 03/29/2021  Okay to refill Plaquenil?  

## 2021-08-09 NOTE — Telephone Encounter (Signed)
Duplicate prescription request.

## 2021-08-16 DIAGNOSIS — D631 Anemia in chronic kidney disease: Secondary | ICD-10-CM | POA: Diagnosis not present

## 2021-08-16 DIAGNOSIS — I129 Hypertensive chronic kidney disease with stage 1 through stage 4 chronic kidney disease, or unspecified chronic kidney disease: Secondary | ICD-10-CM | POA: Diagnosis not present

## 2021-08-16 DIAGNOSIS — E1122 Type 2 diabetes mellitus with diabetic chronic kidney disease: Secondary | ICD-10-CM | POA: Diagnosis not present

## 2021-08-16 DIAGNOSIS — N183 Chronic kidney disease, stage 3 unspecified: Secondary | ICD-10-CM | POA: Diagnosis not present

## 2021-08-16 DIAGNOSIS — N2581 Secondary hyperparathyroidism of renal origin: Secondary | ICD-10-CM | POA: Diagnosis not present

## 2021-08-29 DIAGNOSIS — N183 Chronic kidney disease, stage 3 unspecified: Secondary | ICD-10-CM | POA: Diagnosis not present

## 2021-08-30 ENCOUNTER — Telehealth: Payer: Self-pay | Admitting: Pharmacist

## 2021-08-30 NOTE — Chronic Care Management (AMB) (Signed)
Chronic Care Management Pharmacy Assistant   Name: Desiree [name]  MRN: [id number] DOB: 03-17-1951  Reason for Encounter: General Assessment Call    Conditions to be addressed/monitored: HLD  Recent office visits:  07/06/2021 [name], Neta Mends, MD - Patient presented via tele-health for Hyperlipidemia and other concerns. No medication changes.   Recent consult visits:  None  Hospital visits:  None in previous 6 months  Medications: Outpatient Encounter Medications as of 08/30/2021  Medication Sig Note   Accu-Chek Softclix Lancets lancets Use to test blood sugar daily. Dx:e11.9    Alcohol Swabs (B-D SINGLE USE SWABS REGULAR) PADS USE AS DIRECTED    amLODipine (NORVASC) 5 MG tablet TAKE 1 TABLET EVERY MORNING    azelastine (ASTELIN) 0.1 % nasal spray Place 2 sprays into both nostrils 2 (two) times daily. Use in each nostril as directed    Budeson-Glycopyrrol-Formoterol (BREZTRI AEROSPHERE) 160-9-4.8 MCG/ACT AERO Inhale 2 puffs into the lungs in the morning and at bedtime.    Carboxymethylcellulose Sodium (THERATEARS) 0.25 % SOLN Place 1 drop into both eyes 3 (three) times daily as needed (dry eyes). 12/08/2020: Dry eye Therapy    cetirizine (ZYRTEC) 10 MG tablet Take 10 mg by mouth at bedtime.     diclofenac sodium (VOLTAREN) 1 % GEL APPLY 2-4 GRAMS TO AFFECTED JOINTS UP TO FOUR TIMES DAILY (Patient taking differently: Apply 2-4 g topically 4 (four) times daily as needed (pain).)    famotidine (PEPCID) 40 MG tablet TAKE 1 TABLET (40 MG TOTAL) BY MOUTH DAILY.    feeding supplement, ENSURE ENLIVE, (ENSURE ENLIVE) LIQD Take 237 mLs by mouth 3 (three) times daily between meals.    fluticasone (FLONASE) 50 MCG/ACT nasal spray Place 2 sprays into both nostrils at bedtime.    hydroxychloroquine (PLAQUENIL) 200 MG tablet Take 1 tablet (200 mg total) by mouth daily.    linaclotide (LINZESS) 145 MCG CAPS capsule Take 145 mcg by mouth daily.    losartan (COZAAR) 25 MG tablet TAKE ONE TABLET BY  MOUTH EVERY MORNING    montelukast (SINGULAIR) 10 MG tablet TAKE 1 TABLET BY MOUTH AT BEDTIME    Multiple Vitamins-Minerals (CENTRUM SILVER 50+WOMEN) TABS Take 1 tablet by mouth daily.    nitroGLYCERIN (NITRO-BID) 2 % ointment APPLY 1/4 INCH TOPICALLY TO AFFECTED FINGERTIPS 3 TIMES A DAY (Patient taking differently: Apply 0.25 inches topically 3 (three) times daily. APPLY 1/4 INCH TOPICALLY TO AFFECTED FINGERTIPS 3 TIMES A DAY)    ondansetron (ZOFRAN-ODT) 4 MG disintegrating tablet DISSOLVE 1 TABLET ON THE TONGUE EVERY 8 HOURS AS NEEDED FOR NAUSEA OR VOMITING    pravastatin (PRAVACHOL) 40 MG tablet Take 1 tablet (40 mg total) by mouth daily.    Respiratory Therapy Supplies (FLUTTER) DEVI 1 Device by Does not apply route as directed.    RESTASIS 0.05 % ophthalmic emulsion Place 1 drop into both eyes 2 (two) times daily.    triamcinolone cream (KENALOG) 0.1 % Apply 1 application topically daily as needed (for itching of affected areas).     No facility-administered encounter medications on file as of 08/30/2021.  08/30/2021 Name: Desiree [name] MRN: [id number] DOB: 01-Jan-1951 Desiree [name] is a 70 y.o. year old female who is a primary care patient of [name], Neta Mends, MD.  Comprehensive medication review performed; Spoke to patient regarding cholesterol  Lipid Panel    Component Value Date/Time   CHOL 203 (H) 06/22/2021 1352   TRIG 111.0 06/22/2021 1352   HDL 55.90 06/22/2021 1352  LDLCALC 125 (H) 06/22/2021 1352   LDLDIRECT 146.3 01/26/2010 0000    10-year ASCVD risk score: The ASCVD Risk score (Arnett DK, et al., 2019) failed to calculate for the following reasons:   The patient has a prior MI or stroke diagnosis  Current antihyperlipidemic regimen:  None ASCVD risk enhancing conditions: age >30, DM, and HTN What recent interventions/DTPs have been made by any provider to improve Cholesterol control since last CPP Visit: Patient reports no changes, recently saw kidney doctor and had blood  work done will return to his office on next week. Patient reports no side effects from any meds or concerns at this time. Any recent hospitalizations or ED visits since last visit with CPP? No What diet changes have been made to improve Cholesterol?  Patient reports she has been eating well and appetite is present. What exercise is being done to improve Cholesterol?  Patient reports she is getting around well also, was getting  herself ready to go and visit with her brother in [place].  Adherence Review: Does the patient have >5 day gap between last estimated fill dates? No   Notes: Patient reports she had received her patient assistance paperwork for her eyedrops but does not wish to proceed as she has been using the drops for dry eyes only recently and they are not too expensive most of her meds are no co pay with [facility] with the exception of a nasal spray that is 17 dollars. Updated patient assistance spreadsheet to reflect above. Advised her to give me a call if she decides she wants to forward it to the eye doctor so I can follow up on it she reports ok she will. She reports she is doing well and thanked me for checking in with her last month, advised her that her next contact will be from the pharmacist next month and she was in agreement.  Care Gaps: CCM F/U Call - 12/23 AWV - 5/22 Zoster Vaccine - Overdue DEXA - Overdue Eye Exam - Overdue Foot Exam - Overdue PNA Vaccine - Overdue Flu vaccine - Overdue BP- 128/66 Lab Results  Component Value Date   HGBA1C 6.1 06/22/2021    Star Rating Drugs: Losartan 25 mg - Last filled 06-13-2021 90 [facility] Per prior notes pt will no longer be using [facility] for medications due to insurance change.  Pamala Duffel CMA Clinical Pharmacist Assistant [phone]

## 2021-08-31 ENCOUNTER — Other Ambulatory Visit: Payer: Self-pay | Admitting: Internal Medicine
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
## 2021-09-20 ENCOUNTER — Ambulatory Visit: Payer: Medicare HMO | Admitting: Cardiovascular Disease

## 2021-09-26 ENCOUNTER — Telehealth: Payer: Self-pay | Admitting: Internal Medicine

## 2021-09-26 NOTE — Telephone Encounter (Signed)
Patient wants to know if you could tell her why she's getting bruises without hurting herself. I told her that she may have to schedule an appointment to speak with Dr.[name]. She also wants to know if she could get a referral to be seen for foot therapy.  Patient could be contacted at [phone].  Please advise.

## 2021-09-27 NOTE — Telephone Encounter (Signed)
Pt has been added to virtual schedule on 12/15

## 2021-09-29 ENCOUNTER — Ambulatory Visit: Payer: Medicare HMO | Admitting: Rheumatology

## 2021-10-04 ENCOUNTER — Other Ambulatory Visit: Payer: Self-pay

## 2021-10-04 ENCOUNTER — Ambulatory Visit: Payer: Medicare HMO | Admitting: Podiatry

## 2021-10-04 DIAGNOSIS — L84 Corns and callosities: Secondary | ICD-10-CM

## 2021-10-04 DIAGNOSIS — B351 Tinea unguium: Secondary | ICD-10-CM | POA: Diagnosis not present

## 2021-10-04 DIAGNOSIS — M2011 Hallux valgus (acquired), right foot: Secondary | ICD-10-CM | POA: Diagnosis not present

## 2021-10-04 DIAGNOSIS — E1151 Type 2 diabetes mellitus with diabetic peripheral angiopathy without gangrene: Secondary | ICD-10-CM

## 2021-10-04 DIAGNOSIS — M21611 Bunion of right foot: Secondary | ICD-10-CM | POA: Diagnosis not present

## 2021-10-04 DIAGNOSIS — E1169 Type 2 diabetes mellitus with other specified complication: Secondary | ICD-10-CM

## 2021-10-04 DIAGNOSIS — M2041 Other hammer toe(s) (acquired), right foot: Secondary | ICD-10-CM | POA: Diagnosis not present

## 2021-10-04 NOTE — Progress Notes (Signed)
Subjective:  [name], female    DOB: 04-09-1951,  MRN: [id number]  Chief Complaint  Patient presents with   Callouses     right foot has a place on the bottom of foot/thick toenails   70 y.o. female presents with the above complaint. History confirmed with patient.   Objective:  Physical Exam: warm, good capillary refill, nail exam onychomycosis of the toenails, no trophic changes or ulcerative lesions, normal DP and slightly decreased PT pulses, and absent protective sensation. Hallux valgus right with POP medially, prominent screws palpable proximal 1st met. 2nd hammertoe deformity. 2nd met head palpable plantarly with HPK. HPK 2/5 right, as well as 2nd toe medial PIPJ and medial hallux. HPK left 1/5.  No images are attached to the encounter.  Assessment:   1. Onychomycosis of multiple toenails with type 2 diabetes mellitus and peripheral angiopathy ([place])   2. Diabetes mellitus type 2 with peripheral artery disease (HCC)   3. Callus   4. Hallux valgus with bunions of right foot   5. Hammer toe of right foot     Plan:  Patient was evaluated and treated and all questions answered.  Onychomycosis, DM -Nails debrided x10  Procedure: Nail Debridement Type of Debridement: manual, sharp debridement. Instrumentation: Nail nipper, rotary burr. Number of Nails: 10  Procedure: Paring of Lesion Rationale: painful hyperkeratotic lesion Type of Debridement: manual, sharp debridement. Instrumentation: 312 blade Number of Lesions: 6    Hallux valgus, hammertoe right -Reviewed previous XR -Would likely benefit from HAV and hammertoe correction but would wait for the weather to be a little warmer.  -Patient will need 1st MPJ fusion vs Lapidus/Akin along with HT correction. -Will await warmer months given hx of raynaud's but with adequate circulation.  Return in about 6 weeks (around 11/15/2021) for Bunion, hammertoe f/u possible surgical discussion, with XRs.

## 2021-10-06 ENCOUNTER — Encounter: Payer: Self-pay | Admitting: Internal Medicine

## 2021-10-06 ENCOUNTER — Telehealth (INDEPENDENT_AMBULATORY_CARE_PROVIDER_SITE_OTHER): Payer: Medicare HMO | Admitting: Internal Medicine

## 2021-10-06 VITALS — Ht 62.5 in | Wt 163.2 lb

## 2021-10-06 DIAGNOSIS — E1122 Type 2 diabetes mellitus with diabetic chronic kidney disease: Secondary | ICD-10-CM

## 2021-10-06 DIAGNOSIS — E785 Hyperlipidemia, unspecified: Secondary | ICD-10-CM

## 2021-10-06 DIAGNOSIS — T148XXA Other injury of unspecified body region, initial encounter: Secondary | ICD-10-CM | POA: Diagnosis not present

## 2021-10-06 DIAGNOSIS — N183 Chronic kidney disease, stage 3 unspecified: Secondary | ICD-10-CM

## 2021-10-06 DIAGNOSIS — M069 Rheumatoid arthritis, unspecified: Secondary | ICD-10-CM

## 2021-10-06 DIAGNOSIS — Z79899 Other long term (current) drug therapy: Secondary | ICD-10-CM

## 2021-10-06 NOTE — Progress Notes (Signed)
Virtual Visit via Telephone Note  I connected with@ on 10/06/21 at 10:30 AM EST by telephone and verified that I am speaking with the correct person using two identifiers.   I discussed the limitations, risks, security and privacy concerns of performing an evaluation and management service by telephone and the limited availability of in person appointments. tThere may be a patient responsible charge related to this service. The patient expressed understanding and agreed to proceed.  Location patient: home Location provider: whome office Participants present for the call: patient, provider Patient did not have a visit in the prior 7 days to address this/these issue(s).   History of Present Illness: Desiree [name] presents because last week December 6 when she called she noted asymptomatic bruises 1 below the knee medial thigh and fingers without associated trauma or other bleeding.  No new medicines except for the pravastatin.  Did have lots of bruising with venipuncture in the recent past.  Is due to see Dr. August Luz in January as well as cardiology follow-up. Saw dr Posey Pronto in October and    hg 12 at that time and cr 1.7   May need surgery on knees per Dr. Lorin Mercy.  Observations/Objective: Patient sounds personable and well on the phone. I do not appreciate any SOB. Speech and thought processing are grossly intact. Patient reported vitals: Lab Results  Component Value Date   WBC 9.0 06/22/2021   HGB 13.1 06/22/2021   HCT 39.6 06/22/2021   PLT 280.0 06/22/2021   GLUCOSE 72 06/22/2021   CHOL 203 (H) 06/22/2021   TRIG 111.0 06/22/2021   HDL 55.90 06/22/2021   LDLDIRECT 146.3 01/26/2010   LDLCALC 125 (H) 06/22/2021   ALT 16 06/22/2021   AST 28 06/22/2021   NA 138 06/22/2021   K 4.2 06/22/2021   CL 102 06/22/2021   CREATININE 1.71 (H) 06/22/2021   BUN 19 06/22/2021   CO2 28 06/22/2021   TSH 1.71 06/22/2021   INR 1.16 10/09/2018   HGBA1C 6.1 06/22/2021   MICROALBUR 22.0 (H)  06/22/2021    Assessment and Plan:  Bruising - Plan: Basic metabolic panel, CBC with Differential/Platelet, Hepatic function panel, Lipid panel, Protime-INR, APTT, Hemoglobin A1c  Hyperlipidemia, unspecified hyperlipidemia type - Plan: Basic metabolic panel, CBC with Differential/Platelet, Hepatic function panel, Lipid panel, Protime-INR, APTT, Hemoglobin A1c  Medication management - Plan: Basic metabolic panel, CBC with Differential/Platelet, Hepatic function panel, Lipid panel, Protime-INR, APTT, Hemoglobin A1c  Controlled type 2 diabetes mellitus with chronic kidney disease, without long-term current use of insulin, unspecified CKD stage ([place]) - Plan: Basic metabolic panel, CBC with Differential/Platelet, Hepatic function panel, Lipid panel, Protime-INR, APTT, Hemoglobin A1c  High risk medication use - Plan: Basic metabolic panel, CBC with Differential/Platelet, Hepatic function panel, Lipid panel, Protime-INR, APTT, Hemoglobin A1c  Rheumatoid arthritis, involving unspecified site, unspecified whether rheumatoid factor present ([place]) - Plan: Basic metabolic panel, CBC with Differential/Platelet, Hepatic function panel, Lipid panel, Protime-INR, APTT, Hemoglobin A1c  Stage 3 chronic kidney disease, unspecified whether stage 3a or 3b CKD ([place]) - Plan: Basic metabolic panel, CBC with Differential/Platelet, Hepatic function panel, Lipid panel, Protime-INR, APTT, Hemoglobin A1c Without a specific picture hard to assess fully however since resolved gone in a week without other symptoms not suspect blood disease.  She is due for fasting lipid and chemistry.  We will add a CBC differential and platelets INR and PTT.  We will also reach out to Dr. D to see if other lab work required for her visit  in January.  Trying to avoid multiple vena punctures.   Follow Up Instructions: Make fasting lab appt  before jan 4 with dr D   Keep Jan appt with me and fu earlier if  recurring  @   [phone]  [phone] [phone] [phone] [id number] 21-30 I did not refer this patient for an OV in the next 24 hours for this/these issue(s).  I discussed the assessment and treatment plan with the patient. The patient was provided an opportunity to ask questions and answered. The patient agreed with the plan and demonstrated an understanding of the instructions.   The patient was advised to call back or seek an in-person evaluation if the symptoms worsen or if the condition fails to improve as anticipated.  I provided 22 minutes of non-face-to-face time during this encounter. No follow-ups on file.  Berniece Andreas, MD

## 2021-10-07 ENCOUNTER — Other Ambulatory Visit (INDEPENDENT_AMBULATORY_CARE_PROVIDER_SITE_OTHER): Payer: Medicare HMO

## 2021-10-07 DIAGNOSIS — Z79899 Other long term (current) drug therapy: Secondary | ICD-10-CM | POA: Diagnosis not present

## 2021-10-07 DIAGNOSIS — N183 Chronic kidney disease, stage 3 unspecified: Secondary | ICD-10-CM

## 2021-10-07 DIAGNOSIS — E1122 Type 2 diabetes mellitus with diabetic chronic kidney disease: Secondary | ICD-10-CM | POA: Diagnosis not present

## 2021-10-07 DIAGNOSIS — T148XXA Other injury of unspecified body region, initial encounter: Secondary | ICD-10-CM | POA: Diagnosis not present

## 2021-10-07 DIAGNOSIS — M069 Rheumatoid arthritis, unspecified: Secondary | ICD-10-CM

## 2021-10-07 DIAGNOSIS — E785 Hyperlipidemia, unspecified: Secondary | ICD-10-CM

## 2021-10-07 LAB — PROTIME-INR
INR: 0.9 ratio (ref 0.8–1.0)
Prothrombin Time: 10.4 s (ref 9.6–13.1)

## 2021-10-07 LAB — BASIC METABOLIC PANEL
BUN: 25 mg/dL — ABNORMAL HIGH (ref 6–23)
CO2: 30 mEq/L (ref 19–32)
Calcium: 9.9 mg/dL (ref 8.4–10.5)
Chloride: 101 mEq/L (ref 96–112)
Creatinine, Ser: 1.77 mg/dL — ABNORMAL HIGH (ref 0.40–1.20)
GFR: 28.97 mL/min — ABNORMAL LOW (ref >60.00)
Glucose, Bld: 85 mg/dL (ref 70–99)
Potassium: 4.6 mEq/L (ref 3.5–5.1)
Sodium: 138 mEq/L (ref 135–145)

## 2021-10-07 LAB — CBC WITH DIFFERENTIAL/PLATELET
Basophils Absolute: 0.1 10*3/uL (ref 0.0–0.1)
Basophils Relative: 1.1 % (ref 0.0–3.0)
Eosinophils Absolute: 0.7 10*3/uL (ref 0.0–0.7)
Eosinophils Relative: 8.2 % — ABNORMAL HIGH (ref 0.0–5.0)
HCT: 40.8 % (ref 36.0–46.0)
Hemoglobin: 13.2 g/dL (ref 12.0–15.0)
Lymphocytes Relative: 25 % (ref 12.0–46.0)
Lymphs Abs: 2.2 10*3/uL (ref 0.7–4.0)
MCHC: 32.4 g/dL (ref 30.0–36.0)
MCV: 91.2 fl (ref 78.0–100.0)
Monocytes Absolute: 0.7 10*3/uL (ref 0.1–1.0)
Monocytes Relative: 8.3 % (ref 3.0–12.0)
Neutro Abs: 5.1 10*3/uL (ref 1.4–7.7)
Neutrophils Relative %: 57.4 % (ref 43.0–77.0)
Platelets: 249 10*3/uL (ref 150.0–400.0)
RBC: 4.48 Mil/uL (ref 3.87–5.11)
RDW: 14.1 % (ref 11.5–15.5)
WBC: 8.9 10*3/uL (ref 4.0–10.5)

## 2021-10-07 LAB — LIPID PANEL
Cholesterol: 167 mg/dL (ref 0–200)
HDL: 56.6 mg/dL (ref >39.00)
LDL Cholesterol: 88 mg/dL (ref 0–99)
NonHDL: 110.05
Total CHOL/HDL Ratio: 3
Triglycerides: 111 mg/dL (ref 0.0–149.0)
VLDL: 22.2 mg/dL (ref 0.0–40.0)

## 2021-10-07 LAB — HEPATIC FUNCTION PANEL
ALT: 15 U/L (ref 0–35)
AST: 23 U/L (ref 0–37)
Albumin: 4.5 g/dL (ref 3.5–5.2)
Alkaline Phosphatase: 64 U/L (ref 39–117)
Bilirubin, Direct: 0.1 mg/dL (ref 0.0–0.3)
Total Bilirubin: 0.4 mg/dL (ref 0.2–1.2)
Total Protein: 7.8 g/dL (ref 6.0–8.3)

## 2021-10-07 LAB — APTT: aPTT: 33 s — ABNORMAL HIGH (ref 23.4–32.7)

## 2021-10-07 LAB — HEMOGLOBIN A1C: Hgb A1c MFr Bld: 6.2 % (ref 4.6–6.5)

## 2021-10-09 NOTE — Progress Notes (Signed)
Blood count is ok ,   labs stable  will discuss at visit in January . Will send results to Dr D

## 2021-10-10 ENCOUNTER — Telehealth: Payer: Self-pay | Admitting: Pharmacist

## 2021-10-10 NOTE — Chronic Care Management (AMB) (Signed)
Chronic Care Management Pharmacy Assistant   Name: Desiree [name]  MRN: [id number] DOB: 10-Aug-1951  10/10/21 APPOINTMENT REMINDER  Call to remind patient of upcoming appointment with Clinical Pharmacist on 10/12/21, patient reports her brother Desiree [name] just recently passed and they are working on arrangements for him. She advised she does not want to reschedule at this moment will call when she is ready.    Care Gaps: CCM F/U Call - 12/23 AWV - 5/22 Zoster Vaccine - Overdue DEXA - Overdue Eye Exam - Overdue Foot Exam - Overdue PNA Vaccine - Overdue Flu vaccine - Overdue BP- 128/66 (06/22/21) Lab Results  Component Value Date   HGBA1C 6.2 10/07/2021    Star Rating Drug: Losartan 25 mg - Last filled 08/31/2021 90 DS at [facility] Pravastatin 40 mg - Last filled 07/06/21 90 DS <5 days did not verify  Any gaps in medications fill history? None     Medications: Outpatient Encounter Medications as of 10/10/2021  Medication Sig Note   Accu-Chek Softclix Lancets lancets Use to test blood sugar daily. Dx:e11.9    Alcohol Swabs (B-D SINGLE USE SWABS REGULAR) PADS USE AS DIRECTED    amLODipine (NORVASC) 5 MG tablet TAKE 1 TABLET EVERY MORNING    azelastine (ASTELIN) 0.1 % nasal spray Place 2 sprays into both nostrils 2 (two) times daily. Use in each nostril as directed    Budeson-Glycopyrrol-Formoterol (BREZTRI AEROSPHERE) 160-9-4.8 MCG/ACT AERO Inhale 2 puffs into the lungs in the morning and at bedtime.    Carboxymethylcellulose Sodium (THERATEARS) 0.25 % SOLN Place 1 drop into both eyes 3 (three) times daily as needed (dry eyes). 12/08/2020: Dry eye Therapy    cetirizine (ZYRTEC) 10 MG tablet Take 10 mg by mouth at bedtime.     diclofenac sodium (VOLTAREN) 1 % GEL APPLY 2-4 GRAMS TO AFFECTED JOINTS UP TO FOUR TIMES DAILY (Patient taking differently: Apply 2-4 g topically 4 (four) times daily as needed (pain).)    famotidine (PEPCID) 40 MG tablet TAKE 1 TABLET (40 MG TOTAL)  BY MOUTH DAILY.    feeding supplement, ENSURE ENLIVE, (ENSURE ENLIVE) LIQD Take 237 mLs by mouth 3 (three) times daily between meals.    fluticasone (FLONASE) 50 MCG/ACT nasal spray Place 2 sprays into both nostrils at bedtime.    hydroxychloroquine (PLAQUENIL) 200 MG tablet Take 1 tablet (200 mg total) by mouth daily.    linaclotide (LINZESS) 145 MCG CAPS capsule Take 145 mcg by mouth daily.    losartan (COZAAR) 25 MG tablet TAKE 1 TABLET EVERY MORNING    montelukast (SINGULAIR) 10 MG tablet TAKE 1 TABLET BY MOUTH AT BEDTIME    Multiple Vitamins-Minerals (CENTRUM SILVER 50+WOMEN) TABS Take 1 tablet by mouth daily.    nitroGLYCERIN (NITRO-BID) 2 % ointment APPLY 1/4 INCH TOPICALLY TO AFFECTED FINGERTIPS 3 TIMES A DAY (Patient taking differently: Apply 0.25 inches topically 3 (three) times daily. APPLY 1/4 INCH TOPICALLY TO AFFECTED FINGERTIPS 3 TIMES A DAY)    ondansetron (ZOFRAN-ODT) 4 MG disintegrating tablet DISSOLVE 1 TABLET ON THE TONGUE EVERY 8 HOURS AS NEEDED FOR NAUSEA OR VOMITING    pravastatin (PRAVACHOL) 40 MG tablet Take 1 tablet (40 mg total) by mouth daily.    Respiratory Therapy Supplies (FLUTTER) DEVI 1 Device by Does not apply route as directed.    RESTASIS 0.05 % ophthalmic emulsion Place 1 drop into both eyes 2 (two) times daily.    triamcinolone cream (KENALOG) 0.1 % Apply 1 application topically daily as needed (for  itching of affected areas).     No facility-administered encounter medications on file as of 10/10/2021.     Pamala Duffel CMA Clinical Pharmacist Assistant [phone]

## 2021-10-11 ENCOUNTER — Telehealth: Payer: Self-pay | Admitting: Internal Medicine

## 2021-10-11 NOTE — Telephone Encounter (Signed)
Pt is calling back and would like blood work results 

## 2021-10-12 ENCOUNTER — Telehealth: Payer: Medicare HMO
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
## 2021-10-24 ENCOUNTER — Other Ambulatory Visit: Payer: Self-pay | Admitting: Physician Assistant

## 2021-10-25 NOTE — Telephone Encounter (Signed)
Next Visit: 10/26/2021  Last Visit: 03/29/2021  Labs: 10/07/2021, Eosinophils Relative 8.2, aPTT 33.0, BUN 23, Creatinine, Ser 1.77, GFR 28.97,   Eye exam: 05/25/2021 WNL  Current Dose per office note 03/29/2021:  plaquenil 200 mg 1 tablet by mouth daily  OM:[id number] arthritis with rheumatoid factor of multiple sites without organ or systems involvement (HCC): RF+, Anti-CCP negative, 14-3-3 eta negative  Last Fill: 08/09/2021  Okay to refill Plaquenil?

## 2021-10-26 ENCOUNTER — Other Ambulatory Visit: Payer: Self-pay

## 2021-10-26 ENCOUNTER — Ambulatory Visit (INDEPENDENT_AMBULATORY_CARE_PROVIDER_SITE_OTHER): Payer: Medicare HMO | Admitting: Rheumatology

## 2021-10-26 ENCOUNTER — Encounter: Payer: Self-pay | Admitting: Rheumatology

## 2021-10-26 VITALS — BP 136/88 | HR 87 | Ht 62.5 in | Wt 167.2 lb

## 2021-10-26 DIAGNOSIS — Z87891 Personal history of nicotine dependence: Secondary | ICD-10-CM

## 2021-10-26 DIAGNOSIS — M5030 Other cervical disc degeneration, unspecified cervical region: Secondary | ICD-10-CM

## 2021-10-26 DIAGNOSIS — M19041 Primary osteoarthritis, right hand: Secondary | ICD-10-CM

## 2021-10-26 DIAGNOSIS — Z96641 Presence of right artificial hip joint: Secondary | ICD-10-CM | POA: Diagnosis not present

## 2021-10-26 DIAGNOSIS — M19071 Primary osteoarthritis, right ankle and foot: Secondary | ICD-10-CM | POA: Diagnosis not present

## 2021-10-26 DIAGNOSIS — I7301 Raynaud's syndrome with gangrene: Secondary | ICD-10-CM

## 2021-10-26 DIAGNOSIS — Z8639 Personal history of other endocrine, nutritional and metabolic disease: Secondary | ICD-10-CM

## 2021-10-26 DIAGNOSIS — M19042 Primary osteoarthritis, left hand: Secondary | ICD-10-CM

## 2021-10-26 DIAGNOSIS — Z79899 Other long term (current) drug therapy: Secondary | ICD-10-CM

## 2021-10-26 DIAGNOSIS — M5136 Other intervertebral disc degeneration, lumbar region: Secondary | ICD-10-CM

## 2021-10-26 DIAGNOSIS — N1832 Chronic kidney disease, stage 3b: Secondary | ICD-10-CM

## 2021-10-26 DIAGNOSIS — M0579 Rheumatoid arthritis with rheumatoid factor of multiple sites without organ or systems involvement: Secondary | ICD-10-CM | POA: Diagnosis not present

## 2021-10-26 DIAGNOSIS — M170 Bilateral primary osteoarthritis of knee: Secondary | ICD-10-CM | POA: Diagnosis not present

## 2021-10-26 DIAGNOSIS — E782 Mixed hyperlipidemia: Secondary | ICD-10-CM

## 2021-10-26 DIAGNOSIS — I10 Essential (primary) hypertension: Secondary | ICD-10-CM

## 2021-10-26 DIAGNOSIS — J479 Bronchiectasis, uncomplicated: Secondary | ICD-10-CM

## 2021-10-26 DIAGNOSIS — Z8659 Personal history of other mental and behavioral disorders: Secondary | ICD-10-CM

## 2021-10-26 DIAGNOSIS — M7541 Impingement syndrome of right shoulder: Secondary | ICD-10-CM | POA: Diagnosis not present

## 2021-10-26 DIAGNOSIS — M51369 Other intervertebral disc degeneration, lumbar region without mention of lumbar back pain or lower extremity pain: Secondary | ICD-10-CM

## 2021-10-26 DIAGNOSIS — M19072 Primary osteoarthritis, left ankle and foot: Secondary | ICD-10-CM

## 2021-10-26 MED ORDER — NITRO-BID 2 % TD OINT
TOPICAL_OINTMENT | TRANSDERMAL | 0 refills | Status: DC
Start: 2021-10-26 — End: 2022-04-20

## 2021-10-26 NOTE — Patient Instructions (Addendum)
Standing Labs °We placed an order today for your standing lab work.  ° °Please have your standing labs drawn in May ° °If possible, please have your labs drawn 2 weeks prior to your appointment so that the provider can discuss your results at your appointment. ° °Please note that you may see your imaging and lab results in MyChart before we have reviewed them. °We may be awaiting multiple results to interpret others before contacting you. °Please allow our office up to 72 hours to thoroughly review all of the results before contacting the office for clarification of your results. ° °We have open lab daily: °Monday through Thursday from 1:30-4:30 PM and Friday from 1:30-4:00 PM °at the office of Dr. Kalup Jaquith, [facility].   °Please be advised, all patients with office appointments requiring lab work will take precedent over walk-in lab work.  °If possible, please come for your lab work on Monday and Friday afternoons, as you may experience shorter wait times. °The office is located at [address], [address], [place], [place] [address] °No appointment is necessary.   °Labs are drawn by [facility]. Please bring your co-pay at the time of your lab draw.  You may receive a bill from [facility] for your lab work. ° °If you wish to have your labs drawn at another location, please call the office 24 hours in advance to send orders. ° °If you have any questions regarding directions or hours of operation,  °please call [phone].   °As a reminder, please drink plenty of water prior to coming for your lab work. Thanks!  ° °Vaccines °You are taking a medication(s) that can suppress your immune system.  The following immunizations are recommended: °Flu annually °Covid-19  °Td/Tdap (tetanus, diphtheria, pertussis) every 10 years °Pneumonia (Prevnar 15 then Pneumovax 23 at least 1 year apart.  Alternatively, can take Prevnar 20 without needing additional dose) °Shingrix: 2 doses from 4 weeks to 6 months  apart ° °Please check with your PCP to make sure you are up to date.  °

## 2021-10-28 ENCOUNTER — Other Ambulatory Visit: Payer: Self-pay

## 2021-10-28 ENCOUNTER — Ambulatory Visit: Payer: Medicare HMO | Admitting: Pulmonary Disease

## 2021-10-28 ENCOUNTER — Encounter: Payer: Self-pay | Admitting: Pulmonary Disease

## 2021-10-28 ENCOUNTER — Telehealth: Payer: Self-pay | Admitting: Pulmonary Disease

## 2021-10-28 VITALS — BP 128/70 | HR 60 | Temp 98.4°F | Ht 62.0 in | Wt 167.2 lb

## 2021-10-28 DIAGNOSIS — J449 Chronic obstructive pulmonary disease, unspecified: Secondary | ICD-10-CM | POA: Diagnosis not present

## 2021-10-28 DIAGNOSIS — J471 Bronchiectasis with (acute) exacerbation: Secondary | ICD-10-CM

## 2021-10-28 MED ORDER — ALBUTEROL SULFATE HFA 108 (90 BASE) MCG/ACT IN AERS: 2.0000 | INHALATION_SPRAY | Freq: Four times a day (QID) | RESPIRATORY_TRACT | 2 refills | Status: DC | PRN

## 2021-10-28 MED ORDER — DOXYCYCLINE HYCLATE 100 MG PO TABS: 100.0000 mg | ORAL_TABLET | Freq: Two times a day (BID) | ORAL | 0 refills | Status: AC

## 2021-10-28 NOTE — Patient Instructions (Signed)
Nice to see you again  Further worsening wheezing, take doxycycline 1 tablet twice a day for 14 days.  In addition, use your vest twice a day all 14 days you are taking the antibiotic  Also, use the flutter valve or green plastic tube 4 times a day while taking the antibiotic  Lastly, I sent a prescription for albuterol 2 puffs as needed for shortness of breath or wheezing  Return to clinic in 6 months or sooner as needed with Dr. Judeth HornHunsucker
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
## 2021-10-31 ENCOUNTER — Ambulatory Visit (INDEPENDENT_AMBULATORY_CARE_PROVIDER_SITE_OTHER): Payer: Medicare HMO | Admitting: Internal Medicine

## 2021-10-31 ENCOUNTER — Encounter: Payer: Self-pay | Admitting: Internal Medicine

## 2021-10-31 VITALS — BP 112/60 | HR 91 | Temp 99.3°F | Wt 165.0 lb

## 2021-10-31 DIAGNOSIS — E1169 Type 2 diabetes mellitus with other specified complication: Secondary | ICD-10-CM | POA: Diagnosis not present

## 2021-10-31 DIAGNOSIS — I10 Essential (primary) hypertension: Secondary | ICD-10-CM | POA: Diagnosis not present

## 2021-10-31 DIAGNOSIS — E1122 Type 2 diabetes mellitus with diabetic chronic kidney disease: Secondary | ICD-10-CM

## 2021-10-31 DIAGNOSIS — N183 Chronic kidney disease, stage 3 unspecified: Secondary | ICD-10-CM

## 2021-10-31 DIAGNOSIS — J479 Bronchiectasis, uncomplicated: Secondary | ICD-10-CM

## 2021-10-31 DIAGNOSIS — Z79899 Other long term (current) drug therapy: Secondary | ICD-10-CM | POA: Diagnosis not present

## 2021-10-31 DIAGNOSIS — Z634 Disappearance and death of family member: Secondary | ICD-10-CM | POA: Diagnosis not present

## 2021-10-31 NOTE — Patient Instructions (Addendum)
You can get a second opinion about  the type and timing of foot surgery  with your history  the vasospasm .  Let us know if  want  to get standing orders  with Dr D  here .   Keep [place] informed   about progress your diabetes is controlled .   Skin  no significant lesion seen on your back but if feeling  a bump over time  plan recheck or dermatology check .  Condolences on loss of family brother.
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
## 2021-10-31 NOTE — Telephone Encounter (Signed)
I called and spoke with the pt  She states that her albuterol inhaler was over $100 at pharm  I called her pharm- [facility] and spoke with Cristie Hemesiree  She states that the albuterol is actually $38.50  I called and made pt aware  She will have this filled now  Nothing further needed
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
## 2021-11-04 ENCOUNTER — Other Ambulatory Visit: Payer: Self-pay

## 2021-11-11 ENCOUNTER — Ambulatory Visit: Payer: Medicare HMO | Admitting: Cardiovascular Disease

## 2021-11-11 ENCOUNTER — Other Ambulatory Visit: Payer: Self-pay

## 2021-11-11 ENCOUNTER — Encounter: Payer: Self-pay | Admitting: Cardiovascular Disease

## 2021-11-11 ENCOUNTER — Other Ambulatory Visit: Payer: Self-pay | Admitting: Pulmonary Disease

## 2021-11-11 VITALS — BP 102/70 | HR 89 | Ht 62.0 in | Wt 167.0 lb

## 2021-11-11 DIAGNOSIS — I7301 Raynaud's syndrome with gangrene: Secondary | ICD-10-CM

## 2021-11-11 DIAGNOSIS — Z01810 Encounter for preprocedural cardiovascular examination: Secondary | ICD-10-CM | POA: Diagnosis not present

## 2021-11-11 DIAGNOSIS — I10 Essential (primary) hypertension: Secondary | ICD-10-CM | POA: Diagnosis not present

## 2021-11-11 DIAGNOSIS — I517 Cardiomegaly: Secondary | ICD-10-CM | POA: Diagnosis not present

## 2021-11-11 DIAGNOSIS — I421 Obstructive hypertrophic cardiomyopathy: Secondary | ICD-10-CM | POA: Diagnosis not present

## 2021-11-11 DIAGNOSIS — Z01812 Encounter for preprocedural laboratory examination: Secondary | ICD-10-CM | POA: Diagnosis not present

## 2021-11-11 DIAGNOSIS — J449 Chronic obstructive pulmonary disease, unspecified: Secondary | ICD-10-CM

## 2021-11-11 LAB — BASIC METABOLIC PANEL WITH GFR
BUN/Creatinine Ratio: 17 (ref 12–28)
BUN: 27 mg/dL (ref 8–27)
CO2: 24 mmol/L (ref 20–29)
Calcium: 9.4 mg/dL (ref 8.7–10.3)
Chloride: 100 mmol/L (ref 96–106)
Creatinine, Ser: 1.62 mg/dL — ABNORMAL HIGH (ref 0.57–1.00)
Glucose: 87 mg/dL (ref 70–99)
Potassium: 4.6 mmol/L (ref 3.5–5.2)
Sodium: 137 mmol/L (ref 134–144)
eGFR: 34 mL/min/{1.73_m2} — ABNORMAL LOW

## 2021-11-11 NOTE — Patient Instructions (Addendum)
Medication Instructions:  NO CHANGES *If you need a refill on your cardiac medications before your next appointment, please call your pharmacy*   Lab Work: BMET TODAY  If you have labs (blood work) drawn today and your tests are completely normal, you will receive your results only by: [place] (if you have MyChart) OR A paper copy in the mail If you have any lab test that is abnormal or we need to change your treatment, we will call you to review the results.   Testing/Procedures: Your physician has requested that you have a cardiac MRI. Cardiac MRI uses a computer to create images of your heart as its beating, producing both still and moving pictures of your heart and major blood vessels. For further information please visit [email or web address] Please follow the instruction sheet given to you today for more information.   Follow-Up: At [facility], you and your health needs are our priority.  As part of our continuing mission to provide you with exceptional heart care, we have created designated Provider Care Teams.  These Care Teams include your primary Cardiologist (physician) and Advanced Practice Providers (APPs -  Physician Assistants and Nurse Practitioners) who all work together to provide you with the care you need, when you need it.  We recommend signing up for the patient portal called "MyChart".  Sign up information is provided on this After Visit Summary.  MyChart is used to connect with patients for Virtual Visits (Telemedicine).  Patients are able to view lab/test results, encounter notes, upcoming appointments, etc.  Non-urgent messages can be sent to your provider as well.   To learn more about what you can do with MyChart, go to [email or web address].    Your next appointment:   12 month(s)  The format for your next appointment:   In Person  Provider:   [name], MD      Other Instructions NONE

## 2021-11-15 ENCOUNTER — Ambulatory Visit: Payer: Medicare HMO | Admitting: Podiatry

## 2021-11-15 ENCOUNTER — Other Ambulatory Visit: Payer: Self-pay

## 2021-11-15 ENCOUNTER — Other Ambulatory Visit: Payer: Self-pay | Admitting: Internal Medicine

## 2021-11-15 DIAGNOSIS — L84 Corns and callosities: Secondary | ICD-10-CM

## 2021-11-15 DIAGNOSIS — B351 Tinea unguium: Secondary | ICD-10-CM | POA: Diagnosis not present

## 2021-11-15 DIAGNOSIS — E1169 Type 2 diabetes mellitus with other specified complication: Secondary | ICD-10-CM

## 2021-11-15 DIAGNOSIS — E1151 Type 2 diabetes mellitus with diabetic peripheral angiopathy without gangrene: Secondary | ICD-10-CM | POA: Diagnosis not present

## 2021-11-15 NOTE — Progress Notes (Signed)
Subjective:  [name], female    DOB: 12-06-1950,  MRN: [id number]  Chief Complaint  Patient presents with   Callouses    Recurring callus- bilat foot- requesting debridement Nail trim also being requested by PT    71 y.o. female presents with the above complaint. History confirmed with patient.   [name], [name], MD last seen 10/06/2021.  Last A1c 7 something.  Did not check sugars in AM.  Endorses tingling to the foot, denies pain in the toenails today.  Objective:  Physical Exam: warm, good capillary refill, nail exam onychomycosis of the toenails, no trophic changes or ulcerative lesions, normal DP and slightly decreased PT pulses, and absent protective sensation. Hallux valgus right with POP medially, prominent screws palpable proximal 1st met. 2nd hammertoe deformity. 2nd met head palpable plantarly with HPK. HPK 2/5 right, as well as 2nd toe medial PIPJ and medial hallux. HPK left 1/5.  No images are attached to the encounter.  Assessment:   1. Onychomycosis of multiple toenails with type 2 diabetes mellitus and peripheral angiopathy ([place])   2. Diabetes mellitus type 2 with peripheral artery disease ([facility])   3. Callus     Plan:  Patient was evaluated and treated and all questions answered.  Onychomycosis, DM -Nails debrided x10  Procedure: Nail Debridement Type of Debridement: manual, sharp debridement. Instrumentation: Nail nipper, rotary burr. Number of Nails: 10  Procedure: Paring of Lesion Rationale: painful hyperkeratotic lesion Type of Debridement: manual, sharp debridement. Instrumentation: 312 blade Number of Lesions: 6   No follow-ups on file.

## 2021-11-22 ENCOUNTER — Telehealth: Payer: Self-pay | Admitting: Internal Medicine

## 2021-11-22 NOTE — Telephone Encounter (Signed)
Pt call and stated she need a new RX for amLODipine (NORVASC) 5 MG tablet and famotidine (PEPCID) 40 MG tablet sent to  [facility] - [place], [place] - [address] Phone:  [phone]  Fax:  [phone]

## 2021-11-28 ENCOUNTER — Telehealth: Payer: Self-pay | Admitting: Pharmacist

## 2021-11-28 DIAGNOSIS — E1122 Type 2 diabetes mellitus with diabetic chronic kidney disease: Secondary | ICD-10-CM | POA: Diagnosis not present

## 2021-11-28 DIAGNOSIS — I129 Hypertensive chronic kidney disease with stage 1 through stage 4 chronic kidney disease, or unspecified chronic kidney disease: Secondary | ICD-10-CM | POA: Diagnosis not present

## 2021-11-28 DIAGNOSIS — N183 Chronic kidney disease, stage 3 unspecified: Secondary | ICD-10-CM | POA: Diagnosis not present

## 2021-11-28 DIAGNOSIS — N2581 Secondary hyperparathyroidism of renal origin: Secondary | ICD-10-CM | POA: Diagnosis not present

## 2021-11-28 DIAGNOSIS — D631 Anemia in chronic kidney disease: Secondary | ICD-10-CM | POA: Diagnosis not present

## 2021-11-28 NOTE — Chronic Care Management (AMB) (Signed)
Chronic Care Management Pharmacy Assistant   Name: Etheleen NicksMary C Brem  MRN: [id number] DOB: January 25, 1951   Reason for Encounter: Disease State / General Assessment   Conditions to be addressed/monitored: HTN  Recent office visits:  10/31/21 [name], Neta MendsWanda K, MD - Patient presented for Death of family member and other concerns. No medication changes.  Recent consult visits:  11/15/21 Park LiterPrice, Michael J, DPM (Podiatry) - Patient presented for Onychomycosis of multiple toenails with type 2 diabetes mellitus and peripheral angiopathy and other concerns. No medication changes  11/11/21 Wendall StadeNishan, Peter C, MD (Cardiology) - Patient presented for Hypertension and other concerns. No medication changes.  10/28/21 Hunsucker Lesia SagoMatthew R, MD ( Pulmonology) - Patient presented for COPD and other concerns. Prescribed Albuterol Sulfate & Doxycycline. Changed Ondansetron.  10/26/21 Pollyann Savoyeveshwar, Shaili, MD (Rheumatology) - Patient presented for Rheumatoid arthritis with rheumatoid factor of multiple sites without organ systems involvement and other concerns.   Hospital visits:  None in previous 6 months  Medications: Outpatient Encounter Medications as of 11/28/2021  Medication Sig   Accu-Chek Softclix Lancets lancets Use to test blood sugar daily. Dx:e11.9   Acetaminophen (TYLENOL PO) Take by mouth as needed.   albuterol (VENTOLIN HFA) 108 (90 Base) MCG/ACT inhaler Inhale 2 puffs into the lungs every 6 (six) hours as needed for wheezing or shortness of breath.   Alcohol Swabs (B-D SINGLE USE SWABS REGULAR) PADS USE AS DIRECTED   amLODipine (NORVASC) 5 MG tablet TAKE 1 TABLET EVERY MORNING   ASPIRIN 81 PO Take 81 mg by mouth daily.   azelastine (ASTELIN) 0.1 % nasal spray Place 2 sprays into both nostrils 2 (two) times daily. Use in each nostril as directed   Budeson-Glycopyrrol-Formoterol (BREZTRI AEROSPHERE) 160-9-4.8 MCG/ACT AERO Inhale 2 puffs into the lungs in the morning and at bedtime.   Carboxymethylcellulose  Sodium (THERATEARS) 0.25 % SOLN Place 1 drop into both eyes 3 (three) times daily as needed (dry eyes). (Patient not taking: Reported on 11/11/2021)   cetirizine (ZYRTEC) 10 MG tablet Take 10 mg by mouth at bedtime.    diclofenac sodium (VOLTAREN) 1 % GEL APPLY 2-4 GRAMS TO AFFECTED JOINTS UP TO FOUR TIMES DAILY (Patient taking differently: Apply 2-4 g topically 4 (four) times daily as needed (pain).)   famotidine (PEPCID) 40 MG tablet TAKE 1 TABLET (40 MG TOTAL) BY MOUTH DAILY.   feeding supplement, ENSURE ENLIVE, (ENSURE ENLIVE) LIQD Take 237 mLs by mouth 3 (three) times daily between meals.   fluticasone (FLONASE) 50 MCG/ACT nasal spray Place 2 sprays into both nostrils at bedtime.   hydroxychloroquine (PLAQUENIL) 200 MG tablet TAKE 1 TABLET EVERY DAY   linaclotide (LINZESS) 145 MCG CAPS capsule Take 145 mcg by mouth daily.   losartan (COZAAR) 25 MG tablet TAKE 1 TABLET EVERY MORNING   montelukast (SINGULAIR) 10 MG tablet TAKE 1 TABLET BY MOUTH AT BEDTIME   Multiple Vitamins-Minerals (CENTRUM SILVER 50+WOMEN) TABS Take 1 tablet by mouth daily.   nitroGLYCERIN (NITRO-BID) 2 % ointment APPLY 1/4 INCH TOPICALLY TO AFFECTED FINGERTIPS 3 TIMES A DAY   ondansetron (ZOFRAN-ODT) 4 MG disintegrating tablet DISSOLVE 1 TABLET ON THE TONGUE EVERY 8 HOURS AS NEEDED FOR NAUSEA OR VOMITING   pravastatin (PRAVACHOL) 40 MG tablet Take 1 tablet (40 mg total) by mouth daily.   Respiratory Therapy Supplies (FLUTTER) DEVI 1 Device by Does not apply route as directed.   RESTASIS 0.05 % ophthalmic emulsion Place 1 drop into both eyes 2 (two) times daily.   triamcinolone cream (KENALOG)  0.1 % Apply 1 application topically daily as needed (for itching of affected areas).    No facility-administered encounter medications on file as of 11/28/2021.  Reviewed chart prior to disease state call. Spoke with patient regarding BP  Recent Office Vitals: BP Readings from Last 3 Encounters:  11/11/21 102/70  10/31/21 112/60   10/28/21 128/70   Pulse Readings from Last 3 Encounters:  11/11/21 89  10/31/21 91  10/28/21 60    Wt Readings from Last 3 Encounters:  11/11/21 167 lb (75.8 kg)  10/31/21 165 lb (74.8 kg)  10/28/21 167 lb 3.2 oz (75.8 kg)     Kidney Function Lab Results  Component Value Date/Time   CREATININE 1.62 (H) 11/11/2021 08:24 AM   CREATININE 1.77 (H) 10/07/2021 08:45 AM   GFR 28.97 (L) 10/07/2021 08:45 AM   GFRNONAA 35 (L) 12/18/2020 01:29 AM   GFRAA 43 02/09/2021 12:00 AM    BMP Latest Ref Rng & Units 11/11/2021 10/07/2021 06/22/2021  Glucose 70 - 99 mg/dL 87 85 72  BUN 8 - 27 mg/dL 27 16(X) 19  Creatinine 0.57 - 1.00 mg/dL 0.96(E) 4.54(U) 9.81(X)  BUN/Creat Ratio 12 - 28 17 - -  Sodium 134 - 144 mmol/L 137 138 138  Potassium 3.5 - 5.2 mmol/L 4.6 4.6 4.2  Chloride 96 - 106 mmol/L 100 101 102  CO2 20 - 29 mmol/L Calcium 8.7 - 10.3 mg/dL 9.4 9.9 91.4    Current antihypertensive regimen:  Amlodipine  1 tablet daily  Losartan  1 tablet daily  How often are you checking your Blood Pressure? infrequently Current home BP readings: 124/68 at her doctors appointment on yesterday per patient What recent interventions/DTPs have been made by any provider to improve Blood Pressure control since last CPP Visit: Patient reports none Any recent hospitalizations or ED visits since last visit with CPP? No What exercise is being done to improve your Blood Pressure Control?  Patient reports she has been having some pain in her knees she reports that she has a cane in every room and keeps one in the car as well. Patient reports she was told not to use her cuff at home as it isn't accurate, she reports she hasn't secured one yet so has been checking at her appointments/pharmacy. Patient denies any headaches or dizziness or lightheadedness. Patient voiced concern of Zofran reports she read it was used for cancer patients, advised her it is prescribed to people even without cancer to  help with nausea and vomiting and is safe to use if her PCP prescribed for her. She voiced understanding.  Adherence Review: Is the patient currently on ACE/ARB medication? Yes Does the patient have >5 day gap between last estimated fill dates? No  11/28/21 Patient reports she was out at an appointment and to try her back later.   Care Gaps: DEXA - Overdue Foot Exam - Complete 11/15/21 BP- 1220/70 ( 11/11/21) AWV- 5/22 CCM- 5/23  Star Rating Drugs: Losartan 25 mg - Last filled 08/31/21 90 DS not >5 days so did not verify   Pamala Duffel [facility] Clinical Pharmacist Assistant [phone]

## 2021-12-05 ENCOUNTER — Other Ambulatory Visit: Payer: Self-pay | Admitting: Internal Medicine

## 2021-12-07 ENCOUNTER — Telehealth (HOSPITAL_COMMUNITY): Payer: Self-pay | Admitting: *Deleted

## 2021-12-07 NOTE — Telephone Encounter (Signed)
Reaching out to patient to offer assistance regarding upcoming cardiac imaging study; pt verbalizes understanding of appt date/time, parking situation and where to check in and verified current allergies; name and call back number provided for further questions should they arise  Larey BrickMerle Alizzon Dioguardi RN Navigator Cardiac Imaging [facility] [phone] office [phone] cell  Patient denies claustrophobia or difficult IV start. States she has metal in her toe and back but has a MRI without issue.

## 2021-12-08 ENCOUNTER — Other Ambulatory Visit: Payer: Self-pay

## 2021-12-08 ENCOUNTER — Ambulatory Visit (HOSPITAL_COMMUNITY)
Admission: RE | Admit: 2021-12-08 | Discharge: 2021-12-08 | Disposition: A | Discharge status: Home / Self Care | Payer: Medicare HMO | Source: Ambulatory Visit | Attending: Cardiovascular Disease | Admitting: Cardiovascular Disease

## 2021-12-08 DIAGNOSIS — I517 Cardiomegaly: Secondary | ICD-10-CM | POA: Diagnosis not present

## 2021-12-08 MED ORDER — GADOBUTROL 1 MMOL/ML IV SOLN
10.0000 mL | Freq: Once | INTRAVENOUS | Status: AC | PRN
  Administered 2021-12-08: 10 mL via INTRAVENOUS

## 2021-12-12 ENCOUNTER — Other Ambulatory Visit: Payer: Self-pay | Admitting: Internal Medicine

## 2021-12-12 ENCOUNTER — Other Ambulatory Visit: Payer: Self-pay

## 2021-12-14 ENCOUNTER — Telehealth: Payer: Self-pay | Admitting: Internal Medicine

## 2021-12-14 NOTE — Telephone Encounter (Signed)
See if she can do a virtual or telephone visit tomorrow to discuss

## 2021-12-14 NOTE — Telephone Encounter (Signed)
Please advise 

## 2021-12-14 NOTE — Telephone Encounter (Signed)
Pt is calling and would like know does she has cancer . Pt said her white blood cell was elevated and she is seeing oncologist , hematologist and would like dr Fabian Sharppanosh to call her

## 2021-12-15 NOTE — Telephone Encounter (Signed)
Noted  I dont  any info in record about a new dx  will follow.

## 2021-12-15 NOTE — Telephone Encounter (Signed)
Called patient to schedule virtual visit and patient stated she did not want to schedule and has an upcoming appt with hemologist and will wait to discuss then

## 2021-12-19 ENCOUNTER — Other Ambulatory Visit: Payer: Self-pay

## 2021-12-19 MED ORDER — TRUE METRIX AIR GLUCOSE METER W/DEVICE KIT: PACK | 0 refills | Status: DC

## 2021-12-19 MED ORDER — TRUEPLUS LANCETS 33G MISC: 3 refills | Status: DC

## 2021-12-19 MED ORDER — TRUE METRIX BLOOD GLUCOSE TEST VI STRP: ORAL_STRIP | 3 refills | Status: DC
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
## 2022-01-02 ENCOUNTER — Other Ambulatory Visit: Payer: Self-pay | Admitting: Internal Medicine

## 2022-01-02 ENCOUNTER — Other Ambulatory Visit: Payer: Self-pay

## 2022-01-02 ENCOUNTER — Ambulatory Visit: Payer: Medicare HMO | Admitting: Internal Medicine

## 2022-01-02 ENCOUNTER — Ambulatory Visit (INDEPENDENT_AMBULATORY_CARE_PROVIDER_SITE_OTHER): Payer: Medicare HMO

## 2022-01-02 ENCOUNTER — Encounter: Payer: Self-pay | Admitting: Internal Medicine

## 2022-01-02 VITALS — BP 100/58 | HR 85 | Ht 62.5 in | Wt 167.0 lb

## 2022-01-02 DIAGNOSIS — I422 Other hypertrophic cardiomyopathy: Secondary | ICD-10-CM

## 2022-01-02 DIAGNOSIS — R55 Syncope and collapse: Secondary | ICD-10-CM

## 2022-01-02 NOTE — Progress Notes (Unsigned)
Enrolled for [employer] to mail a ZIO XT long term holter monitor to the patients address on file.  

## 2022-01-02 NOTE — Patient Instructions (Signed)
Medication Instructions:  ?Your physician recommends that you continue on your current medications as directed. Please refer to the Current Medication list given to you today. ? ?*If you need a refill on your cardiac medications before your next appointment, please call your pharmacy* ? ? ?Lab Work: ?NONE ?If you have labs (blood work) drawn today and your tests are completely normal, you will receive your results only by: ?MyChart Message (if you have MyChart) OR ?A paper copy in the mail ?If you have any lab test that is abnormal or we need to change your treatment, we will call you to review the results. ? ? ?Testing/Procedures: ?Your physician has requested that you have a stress echocardiogram. For further information please visit [email or web address]. Please follow instruction sheet as given. ? ?Your physician has requested that you wear a 14 day heart monitor. ? ? ?Follow-Up: ?At [facility], you and your health needs are our priority.  As part of our continuing mission to provide you with exceptional heart care, we have created designated Provider Care Teams.  These Care Teams include your primary Cardiologist (physician) and Advanced Practice Providers (APPs -  Physician Assistants and Nurse Practitioners) who all work together to provide you with the care you need, when you need it. ? ?We recommend signing up for the patient portal called "MyChart".  Sign up information is provided on this After Visit Summary.  MyChart is used to connect with patients for Virtual Visits (Telemedicine).  Patients are able to view lab/test results, encounter notes, upcoming appointments, etc.  Non-urgent messages can be sent to your provider as well.   ?To learn more about what you can do with MyChart, go to [email or web address].   ? ?Your next appointment:   ?6-7  month(s) ? ?The format for your next appointment:   ?In Person ? ?Provider:   ?Rudean Haskell, MD  ? ? ?Other Instructions ?ZIO XT- Long Term Monitor  Instructions ? ?Your physician has requested you wear a ZIO patch monitor for 14 days.  ?This is a single patch monitor. [employer] supplies one patch monitor per enrollment. Additional ?stickers are not available. Please do not apply patch if you will be having a Nuclear Stress Test,  ?Echocardiogram, Cardiac CT, MRI, or Chest Xray during the period you would be wearing the  ?monitor. The patch cannot be worn during these tests. You cannot remove and re-apply the  ?ZIO XT patch monitor.  ?Your ZIO patch monitor will be mailed 3 day USPS to your address on file. It may take 3-5 days  ?to receive your monitor after you have been enrolled.  ?Once you have received your monitor, please review the enclosed instructions. Your monitor  ?has already been registered assigning a specific monitor serial # to you. ? ?Billing and Patient Assistance Program Information ? ?We have supplied [employer] with any of your insurance information on file for billing purposes. ?[employer] offers a sliding scale Patient Assistance Program for patients that do not have  ?insurance, or whose insurance does not completely cover the cost of the ZIO monitor.  ?You must apply for the Patient Assistance Program to qualify for this discounted rate.  ?To apply, please call [employer] at [phone], select option 4, select option 2, ask to apply for  ?Patient Assistance Program. Theodore Demark will ask your household income, and how many people  ?are in your household. They will quote your out-of-pocket cost based on that information.  ?[employer] will also be able to set up a 65-month, interest-free payment  plan if needed. ? ?Applying the monitor ?  ?Shave hair from upper left chest.  ?Hold abrader disc by orange tab. Rub abrader in 40 strokes over the upper left chest as  ?indicated in your monitor instructions.  ?Clean area with 4 enclosed alcohol pads. Let dry.  ?Apply patch as indicated in monitor instructions. Patch will be placed under collarbone on left  ?side  of chest with arrow pointing upward.  ?Rub patch adhesive wings for 2 minutes. Remove white label marked "1". Remove the white  ?label marked "2". Rub patch adhesive wings for 2 additional minutes.  ?While looking in a mirror, press and release button in center of patch. A small green light will  ?flash 3-4 times. This will be your only indicator that the monitor has been turned on.  ?Do not shower for the first 24 hours. You may shower after the first 24 hours.  ?Press the button if you feel a symptom. You will hear a small click. Record Date, Time and  ?Symptom in the Patient Logbook.  ?When you are ready to remove the patch, follow instructions on the last 2 pages of Patient  ?Logbook. Stick patch monitor onto the last page of Patient Logbook.  ?Place Patient Logbook in the blue and white box. Use locking tab on box and tape box closed  ?securely. The blue and white box has prepaid postage on it. Please place it in the mailbox as  ?soon as possible. Your physician should have your test results approximately 7 days after the  ?monitor has been mailed back to [facility].  ?Call [facility] at [phone] if you have questions regarding  ?your ZIO XT patch monitor. Call them immediately if you see an orange light blinking on your  ?monitor.  ?If your monitor falls off in less than 4 days, contact our Monitor department at [phone].  ?If your monitor becomes loose or falls off after 4 days call [employer] at [phone] for  ?suggestions on securing your monitor   ?

## 2022-01-03 ENCOUNTER — Telehealth: Payer: Self-pay | Admitting: Internal Medicine

## 2022-01-03 NOTE — Telephone Encounter (Signed)
Patient given phone number for [employer] of [phone] to call and ask for quote for her out of pocket costs for a ZIO XT monitor.  [employer] has already been supplied with Ms. Brookover's [employer] Medicare information.   ?Explained to Ms. Komm, she could also apply for patient assistance program as self pay, if her out of pocket costs were to high, or could set up a 12 month interest free payment plan with [employer]. ?

## 2022-01-03 NOTE — Telephone Encounter (Signed)
Spoke with patient regarding patient assistance for Linzess and Zio monitor. ? ?Advised patient she will need to speak with the MD who prescribed the Linzess as our office only manages cardiac medications. ? ?Patient also asked if she needed to call for patient assistance to cover the Zio heart monitor. Patient asked if she needs to call her insurance to ask if they are going to cover the monitor. Encouraged patient to go ahead and contact her insurance provider to inquire if they will cover the cost of the Zio monitor. ? ?Will forward to Andee Lineman and Daryll Drown to advise on this matter. ? ?Patient verbalized understanding. ?

## 2022-01-03 NOTE — Telephone Encounter (Signed)
Pt c/o medication issue: ? ?1. Name of Medication: linaclotide (LINZESS) 145 MCG CAPS capsule ? ?2. How are you currently taking this medication (dosage and times per day)? As written  ? ?3. Are you having a reaction (difficulty breathing--STAT)? No  ? ?4. What is your medication issue? Patient cannot afford the medication. Wants to know if she could get patient assistance with the medication and zio monitor. If not, she want to remove the medication due to expense.  ?

## 2022-01-04 ENCOUNTER — Telehealth (HOSPITAL_COMMUNITY): Payer: Self-pay | Admitting: *Deleted

## 2022-01-04 DIAGNOSIS — D7219 Other eosinophilia: Secondary | ICD-10-CM | POA: Diagnosis not present

## 2022-01-04 DIAGNOSIS — D6859 Other primary thrombophilia: Secondary | ICD-10-CM | POA: Diagnosis not present

## 2022-01-04 NOTE — Telephone Encounter (Signed)
Patient was returning call. Please advise ?

## 2022-01-04 NOTE — Telephone Encounter (Signed)
Left message on voicemail on home # because no answer on cell or vmail in reference to upcoming appointment scheduled for 01/11/22. Phone number given for a call back so details instructions can be given. Desiree [name], [name] ? ?

## 2022-01-05 NOTE — Telephone Encounter (Signed)
Patient returned call

## 2022-01-09 ENCOUNTER — Telehealth (HOSPITAL_COMMUNITY): Payer: Self-pay | Admitting: *Deleted

## 2022-01-09 NOTE — Telephone Encounter (Signed)
Patient given instructions for upcoming stress echo. Patient told to arrive by 1:30 Ricky AlaSmith, Fiana Jacqueline ? ?

## 2022-01-11 ENCOUNTER — Ambulatory Visit (HOSPITAL_COMMUNITY): Payer: Medicare HMO

## 2022-01-11 ENCOUNTER — Other Ambulatory Visit: Payer: Self-pay

## 2022-01-11 ENCOUNTER — Other Ambulatory Visit: Payer: Self-pay | Admitting: Internal Medicine

## 2022-01-11 ENCOUNTER — Encounter (HOSPITAL_COMMUNITY): Payer: Self-pay

## 2022-01-11 ENCOUNTER — Telehealth: Payer: Self-pay | Admitting: Internal Medicine

## 2022-01-11 ENCOUNTER — Ambulatory Visit (HOSPITAL_COMMUNITY): Payer: Medicare HMO | Attending: Internal Medicine

## 2022-01-11 DIAGNOSIS — I422 Other hypertrophic cardiomyopathy: Secondary | ICD-10-CM

## 2022-01-11 DIAGNOSIS — R55 Syncope and collapse: Secondary | ICD-10-CM | POA: Diagnosis not present

## 2022-01-11 LAB — ECHOCARDIOGRAM LIMITED
Area-P 1/2: 3.77 cm2
S' Lateral: 2.3 cm

## 2022-01-11 NOTE — Telephone Encounter (Signed)
Called pt advised to wear heart monitor.  Pt advised to call the office to schedule an appointment to have monitor applied if has difficulty applying.  No further questions or concerns.  ?

## 2022-01-11 NOTE — Telephone Encounter (Signed)
Patient is calling stating they were unable to finish her stress echo. She is wanting to know if she should go ahead and put on her heart monitor. Please advise.  ?

## 2022-01-11 NOTE — Progress Notes (Signed)
Echo order changed from stress echo to limited echo due to inability to walk on treadmill per Dr. Izora Ribashandrasekhar. ?

## 2022-01-23 ENCOUNTER — Telehealth: Payer: Self-pay

## 2022-01-23 ENCOUNTER — Other Ambulatory Visit: Payer: Self-pay | Admitting: Physician Assistant

## 2022-01-23 NOTE — Telephone Encounter (Signed)
FYI:  Patient's plaquenil eye exam is scheduled for 02/02/22 and will have the results faxed to our office.   ?

## 2022-01-23 NOTE — Telephone Encounter (Signed)
Next Visit: 03/27/2022 ? ?Last Visit: 10/26/2021 ? ?Labs: 01/04/2022 MCHC 32.7, Monocyte Absolute 0.9, Eosinophil Absolute 1.0,  ?BUN 23, Glucose 68, Creatinine 1.71, Est. GFR 32 ? ?Eye exam: 05/25/2021 f/u 3-6 months, I called patient and advised to schedule f/u PLQ Eye Exam ? ?Current Dose per office note 10/26/2021: dose not mentioned ? ?DX: Rheumatoid arthritis with rheumatoid factor of multiple sites without organ or systems involvement  ? ?Last Fill: 10/25/2021 ? ?Okay to refill Plaquenil? ? ?

## 2022-02-02 DIAGNOSIS — I422 Other hypertrophic cardiomyopathy: Secondary | ICD-10-CM | POA: Diagnosis not present

## 2022-02-02 DIAGNOSIS — H40023 Open angle with borderline findings, high risk, bilateral: Secondary | ICD-10-CM | POA: Diagnosis not present

## 2022-02-02 DIAGNOSIS — R55 Syncope and collapse: Secondary | ICD-10-CM | POA: Diagnosis not present

## 2022-02-02 DIAGNOSIS — Z79899 Other long term (current) drug therapy: Secondary | ICD-10-CM | POA: Diagnosis not present

## 2022-02-02 DIAGNOSIS — M0570 Rheumatoid arthritis with rheumatoid factor of unspecified site without organ or systems involvement: Secondary | ICD-10-CM | POA: Diagnosis not present

## 2022-02-02 DIAGNOSIS — H47013 Ischemic optic neuropathy, bilateral: Secondary | ICD-10-CM | POA: Diagnosis not present

## 2022-02-02 LAB — HM DIABETES EYE EXAM

## 2022-02-10 ENCOUNTER — Telehealth: Payer: Self-pay | Admitting: Pulmonary Disease

## 2022-02-10 MED ORDER — ALBUTEROL SULFATE HFA 108 (90 BASE) MCG/ACT IN AERS: 2.0000 | INHALATION_SPRAY | Freq: Four times a day (QID) | RESPIRATORY_TRACT | 2 refills | Status: DC | PRN

## 2022-02-10 MED ORDER — PREDNISONE 10 MG PO TABS: 40.0000 mg | ORAL_TABLET | Freq: Every day | ORAL | 0 refills | Status: DC

## 2022-02-10 NOTE — Telephone Encounter (Signed)
Patient called in requesting a Rx for something that will help with her wheezing,and coughing.  The coughing has been going on for some time now. Patient states that she would like the medication be called in tonight. Preferred pharmacy is  [facility] please be advise. Routing to triage high priority ?

## 2022-02-10 NOTE — Telephone Encounter (Signed)
Please send prednisone burst 40 mg x5 days for possible AECOPD/bronchiectatic flare. Would not treat with abx at this point given her sputum is clear. Advise her to use mucinex 1200 mg Twice daily, vest therapy bid and flutter valve 2-3 times a day. Suggest decreasing number of sprays of fluticasone to 1 spray and same for astelin to see if this helps with her nose bleeds. Agree with saline nasal gel for dryness. Thanks.

## 2022-02-10 NOTE — Telephone Encounter (Signed)
Called and spoke with pt letting her know recs per [facility] and she verbalized understanding. Verified preferred pharmacy and have sent prednisone Rx to pharmacy for pt. Nothing further needed. ?

## 2022-02-10 NOTE — Telephone Encounter (Signed)
Primary Pulmonologist: Hunsucker ?Last office visit and with whom: 10/28/2021 Hunsucker ?What do we see them for (pulmonary problems): COPD mixed type ?Last OV assessment/plan:  ? ?Assessment & Plan:  ?  ?    ICD-10-CM    ?1. COPD mixed type (HCC)  J44.9    ?   ?2. Bronchiectasis with acute exacerbation (HCC)  J47.1    ?   ?  ?  ?  ?Chronic bronchiectasis-historically very well controlled at this point, very minimal bronchiectasis on imaging with concerns for active exacerbation; unilateral wheeze that clears with coughing consistent with mucus impaction ?- Increase vest therapy to twice a day, flutter valve to 4 times a day ?--Doxycycline twice daily x14 days (penicillin allergy) ?-Continue Breztri -Continue DuoNebs every 4 hours as needed  ?-Continue regular physical activity to maintain exercise tolerance ?  ?COPD; quit smoking in 2003 likely component of asthma given atopic symptoms ?-Continue Breztri ?-DuoNebs and albuterol every 4 hours as needed ?-Up-to-date on seasonal flu, pneumonia, covid vaccines ?-Recommend ongoing tobacco cessation.  Not a candidate for lung cancer screening given that she quit more than 15 years ago. ?  ?  ?RTC in 6 months with Dr. Judeth Horn. ?  ?  ?  ?Karren Burly, MD ?[facility] ?10/28/2021 4:01 PM   ?  ? ? Patient Instructions by Karren Burly, MD at 10/28/2021 1:45 PM ? ?Author: Karren Burly, MD Author Type: Physician Filed: 10/28/2021  2:13 PM  ?Note Status: Signed Cosign: Cosign Not Required Encounter Date: 10/28/2021  ?Editor: Karren Burly, MD (Physician)      ?       ?Nice to see you again ? ?Further worsening wheezing, take doxycycline 1 tablet twice a day for 14 days. ?  ?In addition, use your vest twice a day all 14 days you are taking the antibiotic ?  ?Also, use the flutter valve or green plastic tube 4 times a day while taking the antibiotic ?  ?Lastly, I sent a prescription for albuterol 2 puffs as needed for shortness of breath  or wheezing ?  ?Return to clinic in 6 months or sooner as needed with Dr. Judeth Horn ?  ?  ? ? ?Orthostatic Vitals Recorded in This Encounter ? ? 10/28/2021  ?1352     ?Patient Position: Sitting  ?BP Location: Left Arm  ?Cuff Size: Normal  ? ?Instructions ? ? ?Return in about 6 months (around 04/27/2022). ?Nice to see you again ? ?Further worsening wheezing, take doxycycline 1 tablet twice a day for 14 days. ?  ?In addition, use your vest twice a day all 14 days you are taking the antibiotic ?  ?Also, use the flutter valve or green plastic tube 4 times a day while taking the antibiotic ?  ?Lastly, I sent a prescription for albuterol 2 puffs as needed for shortness of breath or wheezing ?  ?Return to clinic in 6 months or sooner as needed with Dr. Judeth Horn ?   ?  ?  ? ? ?Communication Routing History ? ?Recipient Method Sent by Date Sent  ?Madelin Headings, MD In [facility], Lesia Sago, MD 10/28/2021  ?   ? ?No questionnaires available.  ?   ?  ?  ?  ? ?Orders Placed ? ?None ?Medication Changes ? ? ?Albuterol Sulfate 108 (90 Base) MCG/ACT 2 puffs Inhalation Every 6 hours PRN ? ?Doxycycline Hyclate 100 mg Oral 2 times daily ? ?Ondansetron ? ? ?4 MG Tbdp, DISSOLVE 1 TABLET ON  THE TONGUE EVERY 8 HOURS AS NEEDED FOR NAUSEA OR VOMITING ? ? ?  ?Medication List  ? ?Visit Diagnoses ? ? ?COPD mixed type (HCC) ? ?Bronchiectasis with acute exacerbation (HCC) ?  ?Problem List  ? ?Encounter Status ? ?Signed by Karren BurlyHunsucker, Matthew R, MD on 10/28/21 at 16:01 ?Level of Service ? ?Level of Service  ?PR OFFICE/OUTPATIENT ESTABLISHED MOD MDM 30-39 MIN [[id number]][[id number]]  ? ?All Charges for This Encounter ? ?Code Description Service Date Service Provider Modifiers Qty  ?[id number] PR OFFICE/OUTPATIENT ESTABLISHED MOD MDM 30-39 MIN 10/28/2021 Hunsucker, Lesia SagoMatthew R, MD  1  ? ? ?Was appointment offered to patient (explain)?   ? ?Reason for call: Requesting something for wheezing/coughing.  Coughing up clear mucous, waking up at night.  When she blows her nose it  is bleeding.  States her coughing is about the same.  She is using Fluticasone nasal spray and azelastine nasal spray.  Advised her to use otc saline nasal spray and saline nasal gel for the dryness.  She is using Breztri and albuterol.  She stated she needs a refill of her albuterol sent to her pharmacy (does not need it right now).  Advised I would send in the script for Albuterol.  I let her know that I would get the message to one of our providers regarding your cough and then we will call her back with recommendations.  She verbalized understanding. ? ?Florentina AddisonKatie, ?Please advise regarding ongoing cough.  Thank you. ? ?(examples of things to ask: : When did symptoms start? Fever? Cough? Productive? Color to sputum? More sputum than usual? Wheezing? Have you needed increased oxygen? Are you taking your respiratory medications? What over the counter measures have you tried?) ? ?Allergies  ?Allergen Reactions  ? Prednisone Shortness Of Breath, Itching, Nausea And Vomiting and Palpitations  ? Solu-Medrol [Methylprednisolone] Anaphylaxis  ? Amoxicillin Hives and Rash  ?  Has patient had a PCN reaction causing immediate rash, facial/tongue/throat swelling, SOB or lightheadedness with hypotension:Yes ?Has patient had a PCN reaction causing severe rash involving mucus membranes or skin necrosis: No ?Has patient had a PCN reaction that required hospitalization: No ?Has patient had a PCN reaction occurring within the last 10 years: No ?If all of the above answers are "NO", then may proceed with Cephalosporin use. ?  ? Codeine Hives, Itching and Rash  ?  Tolerated oxycodone and morphine previously  ? Hydrocodone-Acetaminophen Hives, Itching and Rash  ?  Tolerated oxycodone and morphine previously  ? Penicillins Hives, Itching and Rash  ?  ALLERGIC REACTION TO ORAL AMOXICILLIN ?Has patient had a PCN reaction causing immediate rash, facial/tongue/throat swelling, SOB or lightheadedness with hypotension: Yes ?Has patient had a PCN  reaction causing severe rash involving mucus membranes or skin necrosis: No ?Has patient had a PCN reaction that required hospitalization: No ?Has patient had a PCN reaction occurring within the last 10 years: No ?If all of the above answers are "NO", then may proceed with Cephalosporin use.  ? Tape Other (See Comments)  ?  sore  ? ? ?Immunization History  ?Administered Date(s) Administered  ? Fluad Quad(high Dose 65+) 07/09/2019, 07/09/2020, 07/11/2021  ? H1N1 11/13/2008  ? Influenza Whole 07/21/2008, 07/28/2009, 06/28/2010, 07/05/2011, 09/22/2012  ? Influenza, High Dose Seasonal PF 07/05/2017, 07/14/2018  ? Influenza,inj,Quad PF,6+ Mos 07/11/2013, 07/03/2014, 06/23/2015, 06/29/2016  ? PFIZER Comirnaty(Gray Top)Covid-19 Tri-Sucrose Vaccine 02/02/2021  ? PFIZER(Purple Top)SARS-COV-2 Vaccination 11/17/2019, 12/08/2019, 08/04/2020  ? Pneumococcal Conjugate-13 12/19/2013  ? Pneumococcal Polysaccharide-23 02/25/2009, 04/19/2015  ? Td 02/02/2010  ?  Tdap 04/25/2016  ?  ?

## 2022-02-14 ENCOUNTER — Ambulatory Visit: Payer: Medicare HMO | Admitting: Podiatry

## 2022-02-23 ENCOUNTER — Telehealth: Payer: Self-pay | Admitting: Internal Medicine

## 2022-02-23 NOTE — Telephone Encounter (Signed)
Pt call and stated she need dr.[name] to give her a call because she want to know if her medication is causing her hair to break off. ?

## 2022-02-23 NOTE — Telephone Encounter (Signed)
Many medication could do this but has autoimmune  conditions that she has could  do it also . ?She will have to make an appt to evaluate  ?But would be best if she see dermatology for  more definite evaluation opinion.  ? ?If she doesn't have a dermatologist ?  do a referral   if she agrees ?(Otherwise have her make appt with  her derm) ?If she wishes   she can also  make an in  person visit with me  ? ? ?

## 2022-02-23 NOTE — Telephone Encounter (Signed)
Called patient and she stated she noticed 2 bald spot on her head that started a few months ago and would like to know if medication is causing, patient not sure which one and has not changed anything or new Rx ?Please advise  ?

## 2022-02-24 ENCOUNTER — Telehealth: Payer: Self-pay | Admitting: Pharmacist

## 2022-02-24 NOTE — Chronic Care Management (AMB) (Signed)
? ? ?Chronic Care Management ?Pharmacy Assistant  ? ?Name: Desiree [name]  MRN: [id number] DOB: 05/16/1951 ? ?02/24/22 APPOINTMENT REMINDER ? ?Patient was reminded to have all medications, supplements and any blood glucose and blood pressure readings available for review with Desiree [name], Pharm. D, for telephone visit on 02/27/22 at 10:30. ? ? ? ?Care Gaps: ?Dexa Scan - Overdue ?Foot Exam - Overdue ?Mammogram - Overdue ?PNA Vaccine - Postponed ?Eye Exam - Postponed ?COVID Booster - Postponed ?Zoster Vaccine - Postponed ?AWV- 5/22 ?BP- 100/58 ( 01/02/22) ?Lab Results  ?Component Value Date  ? HGBA1C 6.2 10/07/2021  ? ? ?Star Rating Drug: ?Losartan 25 mg - Last filled 01/23/22 90 DS at [facility] ?Pravastatin 40 mg - Last filled 12/06/21 90 DS  ?Any gaps in medications fill history? ? ? ? ? ?Medications: ?Outpatient Encounter Medications as of 02/24/2022  ?Medication Sig  ? Accu-Chek Softclix Lancets lancets Use to test blood sugar daily. Dx:e11.9  ? Acetaminophen (TYLENOL PO) Take by mouth as needed.  ? albuterol (VENTOLIN HFA) 108 (90 Base) MCG/ACT inhaler Inhale 2 puffs into the lungs every 6 (six) hours as needed for wheezing or shortness of breath.  ? albuterol (VENTOLIN HFA) 108 (90 Base) MCG/ACT inhaler Inhale 2 puffs into the lungs every 6 (six) hours as needed for wheezing or shortness of breath.  ? Alcohol Swabs (B-D SINGLE USE SWABS REGULAR) PADS USE AS DIRECTED  ? amLODipine (NORVASC) 5 MG tablet TAKE 1 TABLET EVERY MORNING  ? ASPIRIN 81 PO Take 81 mg by mouth daily.  ? azelastine (ASTELIN) 0.1 % nasal spray Place 2 sprays into both nostrils 2 (two) times daily. Use in each nostril as directed  ? Blood Glucose Monitoring Suppl (TRUE METRIX AIR GLUCOSE METER) w/Device KIT Use as directed DX:11.9  ? Budeson-Glycopyrrol-Formoterol (BREZTRI AEROSPHERE) 160-9-4.8 MCG/ACT AERO Inhale 2 puffs into the lungs in the morning and at bedtime.  ? Carboxymethylcellulose Sodium (THERATEARS) 0.25 % SOLN Place 1 drop into both eyes  3 (three) times daily as needed (dry eyes).  ? cetirizine (ZYRTEC) 10 MG tablet Take 10 mg by mouth at bedtime.   ? diclofenac sodium (VOLTAREN) 1 % GEL APPLY 2-4 GRAMS TO AFFECTED JOINTS UP TO FOUR TIMES DAILY (Patient taking differently: Apply 2-4 g topically 4 (four) times daily as needed (pain).)  ? famotidine (PEPCID) 40 MG tablet TAKE 1 TABLET EVERY DAY  ? feeding supplement, ENSURE ENLIVE, (ENSURE ENLIVE) LIQD Take 237 mLs by mouth 3 (three) times daily between meals.  ? fluticasone (FLONASE) 50 MCG/ACT nasal spray Place 2 sprays into both nostrils at bedtime.  ? glucose blood (TRUE METRIX BLOOD GLUCOSE TEST) test strip Use as instructed DX:11.9  ? hydroxychloroquine (PLAQUENIL) 200 MG tablet TAKE 1 TABLET EVERY DAY  ? linaclotide (LINZESS) 145 MCG CAPS capsule Take 145 mcg by mouth daily.  ? losartan (COZAAR) 25 MG tablet TAKE 1 TABLET EVERY MORNING  ? montelukast (SINGULAIR) 10 MG tablet TAKE 1 TABLET AT BEDTIME  ? Multiple Vitamins-Minerals (CENTRUM SILVER 50+WOMEN) TABS Take 1 tablet by mouth daily.  ? nitroGLYCERIN (NITRO-BID) 2 % ointment APPLY 1/4 INCH TOPICALLY TO AFFECTED FINGERTIPS 3 TIMES A DAY  ? ondansetron (ZOFRAN-ODT) 4 MG disintegrating tablet DISSOLVE 1 TABLET ON THE TONGUE EVERY 8 HOURS AS NEEDED FOR NAUSEA OR VOMITING  ? pravastatin (PRAVACHOL) 40 MG tablet TAKE 1 TABLET EVERY DAY  ? predniSONE (DELTASONE) 10 MG tablet Take 4 tablets (40 mg total) by mouth daily with breakfast.  ? Respiratory Therapy Supplies (  FLUTTER) DEVI 1 Device by Does not apply route as directed.  ? triamcinolone cream (KENALOG) 0.1 % Apply 1 application topically daily as needed (for itching of affected areas).   ? TRUEplus Lancets 33G MISC Use as directed DX:11.9  ? ?No facility-administered encounter medications on file as of 02/24/2022.  ? ? ? ? ? ?Desiree [name] CMA ?Clinical Pharmacist Assistant ?[phone] ? ?

## 2022-02-27 ENCOUNTER — Ambulatory Visit (INDEPENDENT_AMBULATORY_CARE_PROVIDER_SITE_OTHER): Payer: Medicare HMO | Admitting: Pharmacist

## 2022-02-27 DIAGNOSIS — E1122 Type 2 diabetes mellitus with diabetic chronic kidney disease: Secondary | ICD-10-CM

## 2022-02-27 DIAGNOSIS — I10 Essential (primary) hypertension: Secondary | ICD-10-CM

## 2022-02-27 NOTE — Progress Notes (Signed)
Chronic Care Management Pharmacy Note  03/16/2022 Name:  Desiree [name] MRN:  [id number] DOB:  05/23/1951  Summary: LDL not at goal < 70  Patient continues to have affordability issues with medications  Recommendations/Changes made from today's visit: -Recommended purchasing a home BP cuff to monitor blood pressures -Recommend repeat lipid panel and consider increasing to high intensity statin if LDL remains > 70   Plan: Facilitate completion of PAP for Linzess and Restasis Follow up BP assessment in 1 month Follow up in 4 months   Subjective: Desiree [name] is an 71 y.o. year old female who is a primary patient of [name], [name], MD.  The CCM team was consulted for assistance with disease management and care coordination needs.    Engaged with patient by telephone for follow up visit in response to provider referral for pharmacy case management and/or care coordination services.   Consent to Services:  The patient was given information about Chronic Care Management services, agreed to services, and gave verbal consent prior to initiation of services.  Please see initial visit note for detailed documentation.   Patient Care Team: [name], [name], MD as PCP - General (Internal Medicine) Josue Hector, MD as PCP - Cardiology (Cardiology) [name], MD (Orthopedic Surgery) Cottle, Billey Co., MD as Attending Physician (Psychiatry) [name], MD as Consulting Physician (Gastroenterology) Elmarie Shiley, MD as Consulting Physician (Nephrology) Druscilla Brownie, MD as Consulting Physician (Dermatology) Arvella Nigh, MD as Consulting Physician (Obstetrics and Gynecology) Gean Birchwood, DPM (Inactive) as Consulting Physician (Podiatry) [name], MD as Consulting Physician (Pulmonary Disease) Bo Merino, MD as Consulting Physician (Rheumatology) Nicholas Lose, MD as Consulting Physician (Hematology and Oncology) Alen Blew as Physician  Assistant (Psychiatry) Julian Hy, DO as Consulting Physician (Pulmonary Disease) [name], MD as Consulting Physician (Orthopedic Surgery) Viona Gilmore, [facility] as Pharmacist (Pharmacist) [name], MD as Consulting Physician (Orthopedic Surgery)  Recent office visits: 10/31/21 [name], [name], MD - Patient presented for Death of family member and other concerns. No medication changes.  10/06/21 Shanon Ace, MD: Patient presented for bruising.  Recent consult visits: 01/04/22 Consuela Mimes, MD (hem/onc): Patient presented for protein S deficiency follow up.  01/02/22 Rudean Haskell, MD (cardiology): Patient presented for hypertrophic cardiomyopathy.   11/15/21 Evelina Bucy, DPM (Podiatry) - Patient presented for Onychomycosis of multiple toenails with type 2 diabetes mellitus and peripheral angiopathy and other concerns. No medication changes.   11/11/21 Josue Hector, MD (Cardiology) - Patient presented for Hypertension and other concerns. No medication changes.   10/28/21 Hunsucker Bonna Gains, MD ( Pulmonology) - Patient presented for COPD and other concerns. Prescribed Albuterol Sulfate & Doxycycline. Changed Ondansetron.   10/26/21 Bo Merino, MD (Rheumatology) - Patient presented for Rheumatoid arthritis with rheumatoid factor of multiple sites without organ systems involvement and other concerns.   10/04/21 Evelina Bucy, DPM (Podiatry) - Patient presented for Onychomycosis of multiple toenails with type 2 diabetes mellitus and peripheral angiopathy and other concerns. No medication changes.  03/29/21 Leonia Reader (rheumatology): Patient presented for Raynaud's follow up. Plan for another trial of Plaquenil 200 mg daily.    Hospital visits: None in previous 6 months  Objective:  Lab Results  Component Value Date   CREATININE 1.62 (H) 11/11/2021   BUN 27 11/11/2021   GFR 28.97 (L) 10/07/2021   GFRNONAA 35 (L) 12/18/2020   GFRAA 43 02/09/2021    NA 137 11/11/2021  K 4.6 11/11/2021   CALCIUM 9.4 11/11/2021   CO2 24 11/11/2021   GLUCOSE 87 11/11/2021    Lab Results  Component Value Date/Time   HGBA1C 6.2 10/07/2021 08:45 AM   HGBA1C 6.1 06/22/2021 01:52 PM   GFR 28.97 (L) 10/07/2021 08:45 AM   GFR 30.26 (L) 06/22/2021 01:52 PM   MICROALBUR 22.0 (H) 06/22/2021 01:52 PM   MICROALBUR 3.1 (H) 12/31/2015 08:08 AM    Last diabetic Eye exam:  Lab Results  Component Value Date/Time   HMDIABEYEEXA No Retinopathy 09/25/2016 12:00 AM    Last diabetic Foot exam: No results found for: HMDIABFOOTEX   Lab Results  Component Value Date   CHOL 167 10/07/2021   HDL 56.60 10/07/2021   LDLCALC 88 10/07/2021   LDLDIRECT 146.3 01/26/2010   TRIG 111.0 10/07/2021   CHOLHDL 3 10/07/2021       Latest Ref Rng & Units 10/07/2021    8:45 AM 06/22/2021    1:52 PM 02/09/2021   12:00 AM  Hepatic Function  Total Protein 6.0 - 8.3 g/dL 7.8   7.7     Albumin 3.5 - 5.2 g/dL 4.5   4.2   4.5       AST 0 - 37 U/L 23   28     ALT 0 - 35 U/L 15   16     Alk Phosphatase 39 - 117 U/L 64   70     Total Bilirubin 0.2 - 1.2 mg/dL 0.4   0.5     Bilirubin, Direct 0.0 - 0.3 mg/dL 0.1   0.1        This result is from an external source.    Lab Results  Component Value Date/Time   TSH 1.71 06/22/2021 01:52 PM   TSH 2.153 10/10/2018 01:25 AM   TSH 3.398 08/06/2018 11:36 PM   TSH 1.54 08/07/2017 03:27 PM   FREET4 0.73 08/07/2017 03:27 PM       Latest Ref Rng & Units 10/07/2021    8:45 AM 06/22/2021    1:52 PM 02/09/2021   12:00 AM  CBC  WBC 4.0 - 10.5 K/uL 8.9   9.0     Hemoglobin 12.0 - 15.0 g/dL 13.2   13.1   12.4       Hematocrit 36.0 - 46.0 % 40.8   39.6   38       Platelets 150.0 - 400.0 K/uL 249.0   280.0        This result is from an external source.    Lab Results  Component Value Date/Time   VD25OH 32.0 01/20/2019 12:00 AM   VD25OH 53.49 04/01/2015 09:48 AM    Clinical ASCVD: Yes  The 10-year ASCVD risk score (Arnett DK, et  al., 2019) is: 19.3%   Values used to calculate the score:     Age: 71 years     Sex: Female     Is Non-Hispanic African American: Yes     Diabetic: Yes     Tobacco smoker: No     Systolic Blood Pressure: 734 mmHg     Is BP treated: Yes     HDL Cholesterol: 56.6 mg/dL     Total Cholesterol: 167 mg/dL       03/03/2022    2:47 PM 10/31/2021    2:03 PM 03/01/2021    9:57 AM  Depression screen PHQ 2/9  Decreased Interest 0 1 0  Down, Depressed, Hopeless 0 1 0  PHQ - 2 Score  0 2 0  Altered sleeping  2   Tired, decreased energy  2   Change in appetite  1   Feeling bad or failure about yourself   0   Trouble concentrating  1   Moving slowly or fidgety/restless  2   Suicidal thoughts  0   PHQ-9 Score  10         View : No data to display.               View : No data to display.            Social History   Tobacco Use  Smoking Status Former   Packs/day: 2.00   Years: 30.00   Pack years: 60.00   Types: Cigarettes   Quit date: 10/23/2001   Years since quitting: 20.4  Smokeless Tobacco Never   BP Readings from Last 3 Encounters:  01/02/22 (!) 100/58  11/11/21 102/70  10/31/21 112/60   Pulse Readings from Last 3 Encounters:  01/02/22 85  11/11/21 89  10/31/21 91   Wt Readings from Last 3 Encounters:  03/03/22 165 lb (74.8 kg)  01/02/22 167 lb (75.8 kg)  11/11/21 167 lb (75.8 kg)   BMI Readings from Last 3 Encounters:  03/03/22 29.70 kg/m  01/02/22 30.06 kg/m  11/11/21 30.54 kg/m    Assessment/Interventions: Review of patient past medical history, allergies, medications, health status, including review of consultants reports, laboratory and other test data, was performed as part of comprehensive evaluation and provision of chronic care management services.   SDOH:  (Social Determinants of Health) assessments and interventions performed: No  SDOH Screenings   Alcohol Screen: Low Risk    Last Alcohol Screening Score (AUDIT): 0  Depression  (PHQ2-9): Low Risk    PHQ-2 Score: 0  Financial Resource Strain: Low Risk    Difficulty of Paying Living Expenses: Not hard at all  Food Insecurity: No Food Insecurity   Worried About Charity fundraiser in the Last Year: Never true   Ran Out of Food in the Last Year: Never true  Housing: Low Risk    Last Housing Risk Score: 0  Physical Activity: Inactive   Days of Exercise per Week: 0 days   Minutes of Exercise per Session: 0 min  Social Connections: Moderately Integrated   Frequency of Communication with Friends and Family: More than three times a week   Frequency of Social Gatherings with Friends and Family: More than three times a week   Attends Religious Services: More than 4 times per year   Active Member of [employer] or Organizations: Yes   Attends Music therapist: More than 4 times per year   Marital Status: Divorced  Stress: No Stress Concern Present   Feeling of Stress : Not at all  Tobacco Use: Medium Risk   Smoking Tobacco Use: Former   Smokeless Tobacco Use: Never   Passive Exposure: Not on Pensions consultant Needs: No Transportation Needs   Lack of Transportation (Medical): No   Lack of Transportation (Non-Medical): No   CCM Care Plan  Allergies  Allergen Reactions   Prednisone Shortness Of Breath, Itching, Nausea And Vomiting and Palpitations   Solu-Medrol [Methylprednisolone] Anaphylaxis   Amoxicillin Hives and Rash    Has patient had a PCN reaction causing immediate rash, facial/tongue/throat swelling, SOB or lightheadedness with hypotension:Yes Has patient had a PCN reaction causing severe rash involving mucus membranes or skin necrosis: No Has patient had a PCN reaction that  required hospitalization: No Has patient had a PCN reaction occurring within the last 10 years: No If all of the above answers are "NO", then may proceed with Cephalosporin use.    Codeine Hives, Itching and Rash    Tolerated oxycodone and morphine previously    Hydrocodone-Acetaminophen Hives, Itching and Rash    Tolerated oxycodone and morphine previously   Penicillins Hives, Itching and Rash    ALLERGIC REACTION TO ORAL AMOXICILLIN Has patient had a PCN reaction causing immediate rash, facial/tongue/throat swelling, SOB or lightheadedness with hypotension: Yes Has patient had a PCN reaction causing severe rash involving mucus membranes or skin necrosis: No Has patient had a PCN reaction that required hospitalization: No Has patient had a PCN reaction occurring within the last 10 years: No If all of the above answers are "NO", then may proceed with Cephalosporin use.   Tape Other (See Comments)    sore    Medications Reviewed Today     Reviewed by [name], LPN (Licensed Practical Nurse) on 03/03/22 at 1437  Med List Status: <None>   Medication Order Taking? Sig Documenting Provider Last Dose Status Informant  Accu-Chek Softclix Lancets lancets [id number] No Use to test blood sugar daily. Dx:e11.9 [name], [name], MD Taking Active Self  Acetaminophen (TYLENOL PO) [id number] No Take by mouth as needed. [provider] Taking Active   albuterol (VENTOLIN HFA) 108 (90 Base) MCG/ACT inhaler [id number] No Inhale 2 puffs into the lungs every 6 (six) hours as needed for wheezing or shortness of breath. Hunsucker, Bonna Gains, MD Taking Active   albuterol (VENTOLIN HFA) 108 (90 Base) MCG/ACT inhaler [id number]  Inhale 2 puffs into the lungs every 6 (six) hours as needed for wheezing or shortness of breath. Hunsucker, Bonna Gains, MD  Active   Alcohol Swabs (B-D SINGLE USE SWABS REGULAR) PADS [id number] No USE AS DIRECTED [name], [name], MD Taking Active   amLODipine (NORVASC) 5 MG tablet [id number] No TAKE 1 TABLET EVERY MORNING [name], [name], MD Taking Active   ASPIRIN 81 PO [id number] No Take 81 mg by mouth daily. [provider] Taking Active   azelastine (ASTELIN) 0.1 % nasal spray [id number] No Place 2 sprays into both nostrils 2  (two) times daily. Use in each nostril as directed Hunsucker, Bonna Gains, MD Taking Active   Blood Glucose Monitoring Suppl (TRUE METRIX AIR GLUCOSE METER) w/Device KIT [id number] No Use as directed DX:11.9 [name], [name], MD Taking Active   Budeson-Glycopyrrol-Formoterol (BREZTRI AEROSPHERE) 160-9-4.8 MCG/ACT AERO [id number] No Inhale 2 puffs into the lungs in the morning and at bedtime. Parrett, Fonnie Mu, NP Taking Active   Carboxymethylcellulose Sodium (THERATEARS) 0.25 % SOLN [id number] No Place 1 drop into both eyes 3 (three) times daily as needed (dry eyes). [provider] Taking Active            Med Note Luana Shu, NATASHA   Fri Nov 11, 2021  7:42 AM)    cetirizine (ZYRTEC) 10 MG tablet [id number] No Take 10 mg by mouth at bedtime.  [provider] Taking Active Self  diclofenac sodium (VOLTAREN) 1 % GEL [id number] No APPLY 2-4 GRAMS TO AFFECTED JOINTS UP TO FOUR TIMES DAILY  Patient taking differently: Apply 2-4 g topically 4 (four) times daily as needed (pain).   Bo Merino, MD Taking Active   famotidine (PEPCID) 40 MG tablet [id number] No TAKE 1 TABLET EVERY DAY [name], [name], MD Taking Active   feeding supplement, Sharee Pimple (ENSURE ENLIVE) LIQD [id number]  No Take 237 mLs by mouth 3 (three) times daily between meals. [name], MD Taking Active Self  fluticasone [facility]) 50 MCG/ACT nasal spray [id number] No Place 2 sprays into both nostrils at bedtime. [provider] Taking Active Self  glucose blood (TRUE METRIX BLOOD GLUCOSE TEST) test strip [id number] No Use as instructed DX:11.9 [name], [name], MD Taking Active   hydroxychloroquine (PLAQUENIL) 200 MG tablet [id number]  TAKE 1 TABLET EVERY DAY Ofilia Neas, PA-C  Active   linaclotide [facility]) 145 MCG CAPS capsule [id number] No Take 145 mcg by mouth daily. [provider] Taking Active Self  losartan (COZAAR) 25 MG tablet [id number] No TAKE 1 TABLET EVERY MORNING [name], [name], MD Taking  Active   montelukast (SINGULAIR) 10 MG tablet [id number] No TAKE 1 TABLET AT BEDTIME [name], [name], MD Taking Active   Multiple Vitamins-Minerals (CENTRUM SILVER 50+WOMEN) TABS [id number] No Take 1 tablet by mouth daily. [provider] Taking Active Self  nitroGLYCERIN (NITRO-BID) 2 % ointment [id number] No APPLY 1/4 INCH TOPICALLY TO AFFECTED FINGERTIPS 3 TIMES A DAY Deveshwar, Shaili, MD Taking Active   ondansetron (ZOFRAN-ODT) 4 MG disintegrating tablet [id number] No DISSOLVE 1 TABLET ON THE TONGUE EVERY 8 HOURS AS NEEDED FOR NAUSEA OR VOMITING [name], [name], MD Taking Active   pravastatin (PRAVACHOL) 40 MG tablet [id number] No TAKE 1 TABLET EVERY DAY [name], [name], MD Taking Active   predniSONE (DELTASONE) 10 MG tablet [id number]  Take 4 tablets (40 mg total) by mouth daily with breakfast. Clayton Bibles, NP  Active   Respiratory Therapy Supplies (FLUTTER) DEVI [id number] No 1 Device by Does not apply route as directed. Parrett, Fonnie Mu, NP Taking Active Self  triamcinolone cream (KENALOG) 0.1 % [id number] No Apply 1 application topically daily as needed (for itching of affected areas).  [provider] Taking Active Self  TRUEplus Lancets 33G MISC [id number] No Use as directed DX:11.9 [name], [name], MD Taking Active             Patient Active Problem List   Diagnosis Date Noted   Insomnia secondary to chronic pain 12/27/2020   Insomnia due to other mental disorder 12/27/2020   Snoring 12/27/2020   Inadequate sleep hygiene 12/27/2020   S/P total right hip arthroplasty 12/18/2020   Arthritis of right hip 12/17/2020   Encounter for preoperative assessment 12/10/2020   Infection of nail bed of finger of right hand 09/15/2020   Pain in right finger(s) 08/31/2020   Ganglion of right wrist 08/31/2020   COPD mixed type ([place]) 12/26/2018   Healthcare maintenance 12/26/2018   History of fall 11/05/2018   Weight loss 11/05/2018   Weakness 11/05/2018   Gangrene of  finger ([place]) 10/09/2018   Raynaud's phenomenon with gangrene ([place]) 10/09/2018   LVH (left ventricular hypertrophy) 10/09/2018   Vasospasm ([place]) 09/06/2018   Hypotension 08/08/2018   Leucocytosis 08/08/2018   Elevated troponin I level 08/08/2018   Nausea and vomiting 08/06/2018   Pneumonia 07/12/2018   COPD exacerbation ([place]) 07/11/2018   Primary osteoarthritis of both feet 12/21/2017   DDD (degenerative disc disease), lumbar 12/21/2017   Primary osteoarthritis of both knees 12/21/2017   History of bilateral carpal tunnel release 12/21/2017   DDD (degenerative disc disease), cervical 11/15/2017   Former smoker 11/15/2017   Bronchiectasis without complication ([place]) 49/67/5916   Impingement syndrome of right shoulder 07/25/2017   Hx of fusion of cervical spine 07/25/2017   Cervical spinal stenosis 09/27/2016   Polypharmacy 06/23/2015  Hyperlipidemia 04/19/2015   Visit for preventive health examination 01/01/2015   Primary osteoarthritis involving multiple joints 01/01/2015   Bipolar affective disorder, currently depressed, moderate ([place])    Bipolar I disorder with mania ([place]) 08/17/2014   Hypertension 10/21/2013   Dizziness 03/25/2013   Medication withdrawal ([place]) 03/05/2013   Diabetes mellitus with renal manifestations, controlled ([place]) 11/10/2012   Alopecia areata 02/06/2012   Obesity (BMI 30-39.9) 02/06/2012   Renal insufficiency 07/16/2011   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    Exertional dyspnea 02/07/2011   Dyspnea on exertion 01/19/2011   DM (diabetes mellitus), type 2 ([place]) 04/22/2010   Carpal tunnel syndrome 02/02/2010   CONSTIPATION 02/02/2010   Primary osteoarthritis of both hands 02/02/2010   CATARACTS 04/13/2009   PAIN IN JOINT, ANKLE AND FOOT 09/16/2008   Eosinophilia 07/21/2008   AFFECTIVE DISORDER 04/21/2008   BACK PAIN, CHRONIC 02/12/2008   LEG PAIN 02/12/2008   ABNORMAL INVOLUNTARY MOVEMENTS 12/04/2007   POSTURAL LIGHTHEADEDNESS 11/04/2007   COLONIC  POLYPS, HX OF 11/04/2007   Essential hypertension 06/17/2007   HYPERLIPIDEMIA 04/08/2007   GERD 04/08/2007   LOW BACK PAIN SYNDROME 04/08/2007   CHEST PAIN, RECURRENT 04/08/2007    Immunization History  Administered Date(s) Administered   Fluad Quad(high Dose 65+) 07/09/2019, 07/09/2020, 07/11/2021   H1N1 11/13/2008   Influenza Whole 07/21/2008, 07/28/2009, 06/28/2010, 07/05/2011, 09/22/2012   Influenza, High Dose Seasonal PF 07/05/2017, 07/14/2018   Influenza,inj,Quad PF,6+ Mos 07/11/2013, 07/03/2014, 06/23/2015, 06/29/2016   PFIZER Comirnaty(Gray Top)Covid-19 Tri-Sucrose Vaccine 02/02/2021   PFIZER(Purple Top)SARS-COV-2 Vaccination 11/17/2019, 12/08/2019, 08/04/2020   Pneumococcal Conjugate-13 12/19/2013   Pneumococcal Polysaccharide-23 02/25/2009, 04/19/2015   Td 02/02/2010   Tdap 04/25/2016   Patient wanted to know if any of her medications are causing hair loss and she is losing it in patches. Patient thinks that has been going on since August or September. Patient hasn't been to a dermatologist in years but she doesn't want to pursue dermatology referral at this time.  Patient got a new glucometer and needs to know how to help. Recommended a youtube video for instructions.   She has the stethoscope BP cuff. Patient is also having headaches and dizziness/lightheadedness.  Conditions to be addressed/monitored:  Hypertension, Hyperlipidemia, Diabetes, GERD, COPD, Depression, Osteoarthritis and Raynaud's  Conditions addressed this visit: Hypertension, diabetes  Care Plan : CCM Pharmacy Care Plan  Updates made by Viona Gilmore, [place] since 03/16/2022 12:00 AM     Problem: Problem: Hypertension, Hyperlipidemia, Diabetes, GERD, COPD, Depression, Osteoarthritis and Raynaud's      Long-Range Goal: Patient-Specific Goal   Start Date: 03/30/2021  Expected End Date: 03/30/2022  Recent Progress: On track  Priority: High  Note:   Current Barriers:  Unable to independently afford  treatment regimen Unable to independently monitor therapeutic efficacy Suboptimal therapeutic regimen for prevention of heart events  Pharmacist Clinical Goal(s):  Patient will verbalize ability to afford treatment regimen achieve adherence to monitoring guidelines and medication adherence to achieve therapeutic efficacy achieve control of cholesterol as evidenced by next lipid panel  through collaboration with PharmD and provider.   Interventions: 1:1 collaboration with [name], [name], MD regarding development and update of comprehensive plan of care as evidenced by provider attestation and co-signature Inter-disciplinary care team collaboration (see longitudinal plan of care) Comprehensive medication review performed; medication list updated in electronic medical record  Hypertension (BP goal <130/80) -Controlled -Current treatment: Amlodipine 5 mg 1 tablet daily - Appropriate, Effective, Safe, Accessible Losartan 25 mg 1 tablet daily - Appropriate, Effective, Safe,  Accessible -Medications previously tried: furosemide  -Current home readings: not checking at home -Current dietary habits: did not discuss -Current exercise habits: no regular exercise -Reports hypotensive/hypertensive symptoms -Educated on Exercise goal of 150 minutes per week; Importance of home blood pressure monitoring; Proper BP monitoring technique; Symptoms of hypotension and importance of maintaining adequate hydration; -Counseled to monitor BP at home weekly, document, and provide log at future appointments -Counseled on diet and exercise extensively Recommended to continue current medication Recommended purchasing an arm BP cuff to be able to monitor at home  Hyperlipidemia: (LDL goal < 70) -Uncontrolled -Current treatment: Pravastatin 40 mg 1 tablet daily - Appropriate, Query effective, Safe, Accessible -Medications previously tried: atorvastatin (on hold due to possible myalgias due to med),  rosuvastatin (cost)  -Current dietary patterns: did not discuss -Current exercise habits: no regular exercise -Educated on Cholesterol goals;  Benefits of statin for ASCVD risk reduction; Exercise goal of 150 minutes per week; -Counseled on diet and exercise extensively Recommended high intensity statin therapy based on previous stroke  Raynaud's phenomenon (Goal: minimize symptoms) -Controlled -Current treatment  Amlodipine 22m 1 tablet daily - Appropriate, Effective, Safe, Accessible  Plaquenil 200 mg daily - Appropriate, Effective, Safe, Accessible -Medications previously tried: none  -Recommended to continue current medication   Diabetes (A1c goal <7%) -Controlled -Current medications: No medications -Medications previously tried: Actos  -Current home glucose readings fasting glucose: does not check post prandial glucose: does not check -Denies hypoglycemic/hyperglycemic symptoms -Current meal patterns:  breakfast: did not discuss  lunch: did not discuss   dinner: did not discuss  snacks: did not discuss  drinks: did not discuss  -Current exercise: no regular exercise -Educated on Counseled to check feet daily and get yearly eye exams -Counseled to check feet daily and get yearly eye exams -Counseled on diet and exercise extensively  COPD (Goal: control symptoms and prevent exacerbations) -Controlled -Current treatment  Breztri 160-9-4.8 mcg/act 2 puffs into the lungs twice daily - Appropriate, Effective, Safe, Accessible Albuterol nebulizer as needed - Appropriate, Effective, Safe, Accessible -Medications previously tried: n/a  -Gold Grade: Gold 2 (FEV1 50-79%) -Current COPD Classification:  B (high sx, <2 exacerbations/yr) -MMRC/CAT score: n/a -Pulmonary function testing: 2019 -Exacerbations requiring treatment in last 6 months: none -Patient reports consistent use of maintenance inhaler -Frequency of rescue inhaler use: not often -Counseled on Differences  between maintenance and rescue inhalers -Recommended to continue current medication Assessed patient finances. Provided contact info for [employer].  Depression/Bipolar depression (Goal: minimize symptoms) -Not ideally controlled -Current treatment: No medications -Medications previously tried/failed: Quetiapine, risperidone, lorazepam, temazepam, Lamotrigine, Duloxetine -PHQ9: 0 -Educated on Benefits of medication for symptom control -Recommended to follow up with behavioral health  GERD (Goal: minimize symptoms of heartburn/acid reflux) -Controlled -Current treatment  Famotidine 446m1 tablet daily - Appropriate, Effective, Safe, Accessible -Medications previously tried: Dexilant, Nexium, Prilosec, Protonix, Carafate  -Recommended to continue current medication  Constipation (Goal: regular bowel movements) -Uncontrolled -Current treatment  Linzess 14511m 1 capsule once daily as needed for constipation - Appropriate, Effective, Safe, Query accessible -Medications previously tried: none  -Assessed patient finances. Patient would qualify for PAP but unsure of status. Will plan for CMA to follow up.  Osteoarthritis (Goal: minimize pain) -Controlled -Current treatment  Acetaminophen 500m48m tablets every 6 hours as needed - Appropriate, Effective, Safe, Accessible Diclofenac 1% gel, 2-4 grams to affected joints up to 4 times daily (uses 3x/ day) - Appropriate, Effective, Safe, Accessible -Medications previously tried: n/a  -Recommended to continue current  medication  Allergic rhinitis (Goal: minimize symptoms of allergies) -Controlled -Current treatment  Montelukast 58m 1 tablet at bedtime - Appropriate, Effective, Safe, Accessible Cetirizine 12m 1 tablet at bedtime - Appropriate, Effective, Safe, Accessible Fluticasone nasal spray, 1 to 2 sprays into both nostrils at bedtime  - Appropriate, Effective, Safe, Accessible -Medications previously tried: n/a  -Recommended to  continue current medication   Health Maintenance -Vaccine gaps: shingrix -Current therapy:  Multivitamin (Centrum Silver), 1 tablet once daily Triamcinolone 0.0.2%ream, 1 application as needed for itching of affected areas Refresh repair eye drops- 3x/day Refresh Optive gel Ensure- 2-3x/ day  Ondasetron 58m47mDT,1 tablet every 8 hours as needed -Educated on Cost vs benefit of each product must be carefully weighed by individual consumer -Patient is satisfied with current therapy and denies issues -Recommended to continue current medication  Patient Goals/Self-Care Activities Patient will:  - take medications as prescribed check blood pressure weekly, document, and provide at future appointments target a minimum of 150 minutes of moderate intensity exercise weekly  Follow Up Plan: Telephone follow up appointment with care management team member scheduled for: 4 months      Medication Assistance: Application for [employer]  medication assistance program. in process.  Anticipated assistance start date 05/21/21.  See plan of care for additional detail.  Compliance/Adherence/Medication fill history: Care Gaps: Shingrix, eye exam, DEXA, foot exam, pneumovax, mammogram, COVID booster BP- 100/58 ( 01/02/22)  Star-Rating Drugs: Losartan 25 mg - Last [facility]/23/22 [facility] - Last filled 12/06/21 90 DS   Patient's preferred [facility] is:  [facility]one: [phone]: [phone] [facility] [facility] - [address] [place] [place]one: [phone]: [id number] [place] [place]one: [phone]: [phone] pill box? No -  has own system Pt endorses 80% compliance  We discussed: Benefits of medication synchronization, packaging and delivery as well as enhanced pharmacist oversight with [employer]. Patient decided to: Continue current medication management strategy  Care Plan and Follow Up Patient Decision:  Patient agrees to Care Plan and Follow-up.  Plan: Telephone follow up appointment with care management team member scheduled for:  4-5 months  MadJeni SallesharmD, BCAFulsheararmacist Le[place] [phone]

## 2022-02-27 NOTE — Telephone Encounter (Signed)
Patient informed of message and has appt scheduled on 04/05/22 patient would like to wait on referral ?

## 2022-02-28 ENCOUNTER — Telehealth: Payer: Self-pay | Admitting: Pulmonary Disease

## 2022-02-28 NOTE — Telephone Encounter (Signed)
Tried calling patient to inform her that she needed to fill out a new Breztri [place] patient assistance paperwork. No answer. I did print off paperwork and sent it to her via mail with a note asking she to fill out paperwork to re enroll for the program. Nothing further needed  ?

## 2022-03-03 ENCOUNTER — Ambulatory Visit (INDEPENDENT_AMBULATORY_CARE_PROVIDER_SITE_OTHER): Payer: Medicare HMO

## 2022-03-03 VITALS — Ht 62.5 in | Wt 165.0 lb

## 2022-03-03 DIAGNOSIS — Z0000 Encounter for general adult medical examination without abnormal findings: Secondary | ICD-10-CM

## 2022-03-03 NOTE — Patient Instructions (Addendum)
?Desiree [name] , ?Thank you for taking time to come for your Medicare Wellness Visit. I appreciate your ongoing commitment to your health goals. Please review the following plan we discussed and let me know if I can assist you in the future.  ? ?These are the goals we discussed: ? Goals   ? ?   No current goals (pt-stated)   ?   patient   ?   Start eating a small lunch; yogurt, cheese, peanut cracker or toast with cheese ?Protein bar  ? ?  ?   RN CM-Make and Keep All Appointments   ?   Timeframe:  Long-Range Goal ?Priority:  High ?Start Date:    11/29/20                         ?Expected End Date:     06/22/21                 ? ?Follow Up Date 03/22/21 ?  ?- ask family or friend for a ride  ?  ?Why is this important?   ?Part of staying healthy is seeing the doctor for follow-up care.  ?If you forget your appointments, there are some things you can do to stay on track.  ?  ?Notes: 11/29/20 Patient currently drives self to appointments.   ?  ?   RN CM-Track and Manage My Symptoms-COPD   ?   Timeframe:  Long-Range Goal ?Priority:  High ?Start Date: 11/29/20                            ?Expected End Date:    06/22/21                  ? ?Follow Up Date 03/22/21 ?  ?- follow rescue plan if symptoms flare-up ?- keep follow-up appointments  ?  ?Why is this important?   ?Tracking your symptoms and other information about your health helps your doctor plan your care.  ?Write down the symptoms, the time of day, what you were doing and what medicine you are taking.  ?You will soon learn how to manage your symptoms.   ?  ?Notes: 11/29/20 Patient reports COPD management is good with Breztri usage.   ?  ? ?  ?  ?This is a list of the screening recommended for you and due dates:  ?Health Maintenance  ?Topic Date Due  ? Complete foot exam   07/05/2018  ? Pneumonia Vaccine (3 - PPSV23 if available, else PCV20) 04/30/2022*  ? Eye exam for diabetics  04/30/2022*  ? COVID-19 Vaccine (5 - Booster for Pfizer series) 04/30/2022*  ? Zoster (Shingles)  Vaccine (1 of 2) 04/30/2022*  ? Mammogram  03/04/2023*  ? DEXA scan (bone density measurement)  03/04/2023*  ? Hemoglobin A1C  04/07/2022  ? Flu Shot  05/23/2022  ? Colon Cancer Screening  01/06/2024  ? Tetanus Vaccine  04/25/2026  ? Hepatitis C Screening: USPSTF Recommendation to screen - Ages 5-79 yo.  Completed  ? HPV Vaccine  Aged Out  ?*Topic was postponed. The date shown is not the original due date.  ?  ?Advanced directives: Yes ? ?Conditions/risks identified: None ? ?Next appointment: Follow up in one year for your annual wellness visit  ? ? ? ?Preventive Care 45 Years and Older, Female ?Preventive care refers to lifestyle choices and visits with your health care provider that can promote health and  wellness. ?What does preventive care include? ?A yearly physical exam. This is also called an annual well check. ?Dental exams once or twice a year. ?Routine eye exams. Ask your health care provider how often you should have your eyes checked. ?Personal lifestyle choices, including: ?Daily care of your teeth and gums. ?Regular physical activity. ?Eating a healthy diet. ?Avoiding tobacco and drug use. ?Limiting alcohol use. ?Practicing safe sex. ?Taking low-dose aspirin every day. ?Taking vitamin and mineral supplements as recommended by your health care provider. ?What happens during an annual well check? ?The services and screenings done by your health care provider during your annual well check will depend on your age, overall health, lifestyle risk factors, and family history of disease. ?Counseling  ?Your health care provider may ask you questions about your: ?Alcohol use. ?Tobacco use. ?Drug use. ?Emotional well-being. ?Home and relationship well-being. ?Sexual activity. ?Eating habits. ?History of falls. ?Memory and ability to understand (cognition). ?Work and work Astronomerenvironment. ?Reproductive health. ?Screening  ?You may have the following tests or measurements: ?Height, weight, and BMI. ?Blood  pressure. ?Lipid and cholesterol levels. These may be checked every 5 years, or more frequently if you are over 70 years old. ?Skin check. ?Lung cancer screening. You may have this screening every year starting at age 70 if you have a 30-pack-year history of smoking and currently smoke or have quit within the past 15 years. ?Fecal occult blood test (FOBT) of the stool. You may have this test every year starting at age 70. ?Flexible sigmoidoscopy or colonoscopy. You may have a sigmoidoscopy every 5 years or a colonoscopy every 10 years starting at age 70. ?Hepatitis C blood test. ?Hepatitis B blood test. ?Sexually transmitted disease (STD) testing. ?Diabetes screening. This is done by checking your blood sugar (glucose) after you have not eaten for a while (fasting). You may have this done every 1-3 years. ?Bone density scan. This is done to screen for osteoporosis. You may have this done starting at age 70. ?Mammogram. This may be done every 1-2 years. Talk to your health care provider about how often you should have regular mammograms. ?Talk with your health care provider about your test results, treatment options, and if necessary, the need for more tests. ?Vaccines  ?Your health care provider may recommend certain vaccines, such as: ?Influenza vaccine. This is recommended every year. ?Tetanus, diphtheria, and acellular pertussis (Tdap, Td) vaccine. You may need a Td booster every 10 years. ?Zoster vaccine. You may need this after age 70. ?Pneumococcal 13-valent conjugate (PCV13) vaccine. One dose is recommended after age 70. ?Pneumococcal polysaccharide (PPSV23) vaccine. One dose is recommended after age 70. ?Talk to your health care provider about which screenings and vaccines you need and how often you need them. ?This information is not intended to replace advice given to you by your health care provider. Make sure you discuss any questions you have with your health care provider. ?Document Released:  11/05/2015 Document Revised: 06/28/2016 Document Reviewed: 08/10/2015 ?Elsevier Interactive Patient Education ? 2017 Elsevier Inc. ? ?Fall Prevention in the Home ?Falls can cause injuries. They can happen to people of all ages. There are many things you can do to make your home safe and to help prevent falls. ?What can I do on the outside of my home? ?Regularly fix the edges of walkways and driveways and fix any cracks. ?Remove anything that might make you trip as you walk through a door, such as a raised step or threshold. ?Trim any bushes or trees on  the path to your home. ?Use bright outdoor lighting. ?Clear any walking paths of anything that might make someone trip, such as rocks or tools. ?Regularly check to see if handrails are loose or broken. Make sure that both sides of any steps have handrails. ?Any raised decks and porches should have guardrails on the edges. ?Have any leaves, snow, or ice cleared regularly. ?Use sand or salt on walking paths during winter. ?Clean up any spills in your garage right away. This includes oil or grease spills. ?What can I do in the bathroom? ?Use night lights. ?Install grab bars by the toilet and in the tub and shower. Do not use towel bars as grab bars. ?Use non-skid mats or decals in the tub or shower. ?If you need to sit down in the shower, use a plastic, non-slip stool. ?Keep the floor dry. Clean up any water that spills on the floor as soon as it happens. ?Remove soap buildup in the tub or shower regularly. ?Attach bath mats securely with double-sided non-slip rug tape. ?Do not have throw rugs and other things on the floor that can make you trip. ?What can I do in the bedroom? ?Use night lights. ?Make sure that you have a light by your bed that is easy to reach. ?Do not use any sheets or blankets that are too big for your bed. They should not hang down onto the floor. ?Have a firm chair that has side arms. You can use this for support while you get dressed. ?Do not have  throw rugs and other things on the floor that can make you trip. ?What can I do in the kitchen? ?Clean up any spills right away. ?Avoid walking on wet floors. ?Keep items that you use a lot in easy-to-reach places. ?If you n

## 2022-03-03 NOTE — Progress Notes (Signed)
? ?Subjective:  ? Desiree [name] is a 71 y.o. female who presents for Medicare Annual (Subsequent) preventive examination. ? ?Review of Systems    ?Virtual Visit via Telephone Note ? ?I connected with  Desiree [name] on 03/03/22 at  2:30 PM EDT by telephone and verified that I am speaking with the correct person using two identifiers. ? ?Location: ?Patient: Home ?Provider: Office ?Persons participating in the virtual visit: patient/Nurse Health Advisor ?  ?I discussed the limitations, risks, security and privacy concerns of performing an evaluation and management service by telephone and the availability of in person appointments. The patient expressed understanding and agreed to proceed. ? ?Interactive audio and video telecommunications were attempted between this nurse and patient, however failed, due to patient having technical difficulties OR patient did not have access to video capability.  We continued and completed visit with audio only. ? ?Some vital signs may be absent or patient reported.  ? ?[name], LPN  ?Cardiac Risk Factors include: advanced age (>37mn, >>38women);hypertension ? ?   ?Objective:  ?  ?Today's Vitals  ? 03/03/22 1438  ?Weight: 165 lb (74.8 kg)  ?Height: 5' 2.5" (1.588 m)  ? ?Body mass index is 29.7 kg/m?. ? ? ?  03/03/2022  ?  2:51 PM 03/01/2021  ?  9:55 AM 12/17/2020  ?  4:31 PM 12/13/2020  ?  3:23 PM 10/28/2020  ?  3:36 PM 03/02/2020  ?  9:58 AM 01/13/2020  ? 10:11 PM  ?Advanced Directives  ?Does Patient Have a Medical Advance Directive? Yes Yes Yes Yes Yes No;Yes No  ?Type of AParamedicof ACane SavannahLiving will [place]Living will Healthcare Power of [place]on[place] Mountain EstatesLiving will Living will Healthcare Power of Attorney   ?Does patient want to make changes to medical advance directive? No - Patient declined No - Patient declined No - Patient declined   No - Patient declined   ?Copy of [place]in  Chart? No - copy requested No - copy requested Yes - validated most recent copy scanned in chart (See row information) Yes - validated most recent copy scanned in chart (See row information)  Yes - validated most recent copy scanned in chart (See row information)   ?Would patient like information on creating a medical advance directive?      No - Patient declined No - Patient declined  ? ? ?Current Medications (verified) ?Outpatient Encounter Medications as of 03/03/2022  ?Medication Sig  ? Accu-Chek Softclix Lancets lancets Use to test blood sugar daily. Dx:e11.9  ? Acetaminophen (TYLENOL PO) Take by mouth as needed.  ? albuterol (VENTOLIN HFA) 108 (90 Base) MCG/ACT inhaler Inhale 2 puffs into the lungs every 6 (six) hours as needed for wheezing or shortness of breath.  ? albuterol (VENTOLIN HFA) 108 (90 Base) MCG/ACT inhaler Inhale 2 puffs into the lungs every 6 (six) hours as needed for wheezing or shortness of breath.  ? Alcohol Swabs (B-D SINGLE USE SWABS REGULAR) PADS USE AS DIRECTED  ? amLODipine (NORVASC) 5 MG tablet TAKE 1 TABLET EVERY MORNING  ? ASPIRIN 81 PO Take 81 mg by mouth daily.  ? azelastine (ASTELIN) 0.1 % nasal spray Place 2 sprays into both nostrils 2 (two) times daily. Use in each nostril as directed  ? Blood Glucose Monitoring Suppl (TRUE METRIX AIR GLUCOSE METER) w/Device KIT Use as directed DX:11.9  ? Budeson-Glycopyrrol-Formoterol (BREZTRI AEROSPHERE) 160-9-4.8 MCG/ACT AERO Inhale 2 puffs into the lungs  in the morning and at bedtime.  ? Carboxymethylcellulose Sodium (THERATEARS) 0.25 % SOLN Place 1 drop into both eyes 3 (three) times daily as needed (dry eyes).  ? cetirizine (ZYRTEC) 10 MG tablet Take 10 mg by mouth at bedtime.   ? diclofenac sodium (VOLTAREN) 1 % GEL APPLY 2-4 GRAMS TO AFFECTED JOINTS UP TO FOUR TIMES DAILY (Patient taking differently: Apply 2-4 g topically 4 (four) times daily as needed (pain).)  ? famotidine (PEPCID) 40 MG tablet TAKE 1 TABLET EVERY DAY  ? feeding  supplement, ENSURE ENLIVE, (ENSURE ENLIVE) LIQD Take 237 mLs by mouth 3 (three) times daily between meals.  ? fluticasone (FLONASE) 50 MCG/ACT nasal spray Place 2 sprays into both nostrils at bedtime.  ? glucose blood (TRUE METRIX BLOOD GLUCOSE TEST) test strip Use as instructed DX:11.9  ? hydroxychloroquine (PLAQUENIL) 200 MG tablet TAKE 1 TABLET EVERY DAY  ? linaclotide (LINZESS) 145 MCG CAPS capsule Take 145 mcg by mouth daily.  ? losartan (COZAAR) 25 MG tablet TAKE 1 TABLET EVERY MORNING  ? montelukast (SINGULAIR) 10 MG tablet TAKE 1 TABLET AT BEDTIME  ? Multiple Vitamins-Minerals (CENTRUM SILVER 50+WOMEN) TABS Take 1 tablet by mouth daily.  ? nitroGLYCERIN (NITRO-BID) 2 % ointment APPLY 1/4 INCH TOPICALLY TO AFFECTED FINGERTIPS 3 TIMES A DAY  ? ondansetron (ZOFRAN-ODT) 4 MG disintegrating tablet DISSOLVE 1 TABLET ON THE TONGUE EVERY 8 HOURS AS NEEDED FOR NAUSEA OR VOMITING  ? pravastatin (PRAVACHOL) 40 MG tablet TAKE 1 TABLET EVERY DAY  ? predniSONE (DELTASONE) 10 MG tablet Take 4 tablets (40 mg total) by mouth daily with breakfast.  ? Respiratory Therapy Supplies (FLUTTER) DEVI 1 Device by Does not apply route as directed.  ? triamcinolone cream (KENALOG) 0.1 % Apply 1 application topically daily as needed (for itching of affected areas).   ? TRUEplus Lancets 33G MISC Use as directed DX:11.9  ? ?No facility-administered encounter medications on file as of 03/03/2022.  ? ? ?Allergies (verified) ?Prednisone, Solu-medrol [methylprednisolone], Amoxicillin, Codeine, Hydrocodone-acetaminophen, Penicillins, and Tape  ? ?History: ?Past Medical History:  ?Diagnosis Date  ? Acute gastritis without bleeding 08/08/2018  ? Anemia   ? Anxiety   ? Asthma   ? Bipolar disorder ([place])   ? CANDIDIASIS, ESOPHAGEAL 07/28/2009  ? Qualifier: Diagnosis of  By: [name] MD, [name]   ? Chronic kidney disease   ? CKD III  ? Complication of anesthesia   ? was told she stopped breathing for one of her finger surgeries  ? COPD (chronic  obstructive pulmonary disease) ([place])   ? Depression   ? Diabetes mellitus without complication ([place])   ? no meds  ? Dyspnea   ? At rest,and with activity  ? FH: colonic polyps   ? Fractured elbow   ? right   ? GERD (gastroesophageal reflux disease)   ? Headache   ? migraines  ? HH (hiatus hernia)   ? History of carpal tunnel syndrome   ? History of chest pain   ? History of transfusion of packed red blood cells   ? Hyperlipidemia   ? Hypertension   ? Mitral valve prolapse   ? Neuroleptic-induced tardive dyskinesia   ? Osteoarthritis of more than one site   ? Pneumonia   ? Seasonal allergies   ? Stroke [facility])   ? ?Past Surgical History:  ?Procedure Laterality Date  ? AMPUTATION Left 10/14/2018  ? Procedure: AMPUTATION LEFT LONG FINGER TIP;  Surgeon: Dayna Barker, MD;  Location: [place];  Service: Plastics;  Laterality: Left;  ? ANTERIOR CERVICAL DECOMP/DISCECTOMY FUSION  09/27/2016  ? C5-6 anterior cervical discectomy and fusion, allograft and plate/notes 09/27/2016  ? ANTERIOR CERVICAL DECOMP/DISCECTOMY FUSION N/A 09/27/2016  ? Procedure: C5-6 Anterior Cervical Discectomy and Fusion, Allograft and Plate;  Surgeon: [name], MD;  Location: [place];  Service: Orthopedics;  Laterality: N/A;  ? Back Fusion  2002  ? BIOPSY  07/19/2018  ? Procedure: BIOPSY;  Surgeon: [name], MD;  Location: [facility];  Service: Endoscopy;;  ? BIOPSY  02/13/2019  ? Procedure: BIOPSY;  Surgeon: [name], MD;  [facility];  Service: Endoscopy;;  ? CARPAL TUNNEL RELEASE  yates  ? left  ? COLONOSCOPY N/A 01/05/2014  ? Procedure: COLONOSCOPY;  Surgeon: [name], MD;  Location: [facility];  Service: Endoscopy;  Laterality: N/A;  ? ELBOW SURGERY    ? age 13  ? ENTEROSCOPY N/A 02/13/2019  ? Procedure: ENTEROSCOPY;  Surgeon: [name], MD;  Location: [facility];  Service: Endoscopy;  Laterality: N/A;  ? ESOPHAGOGASTRODUODENOSCOPY (EGD) WITH PROPOFOL N/A 07/19/2018  ? Procedure: ESOPHAGOGASTRODUODENOSCOPY (EGD) WITH  PROPOFOL;  Surgeon: [name], MD;  Location: [facility];  Service: Endoscopy;  Laterality: N/A;  ? EXTERNAL EAR SURGERY Left   ? EYE SURGERY    ? "removed white dots under eyelid"  ? FINGER SURGERY Left   ? Juvara os

## 2022-03-07 ENCOUNTER — Telehealth: Payer: Self-pay | Admitting: Pulmonary Disease

## 2022-03-07 DIAGNOSIS — J479 Bronchiectasis, uncomplicated: Secondary | ICD-10-CM

## 2022-03-07 NOTE — Telephone Encounter (Signed)
Called patient and informed her that order for flutter valve was placed and that if she had any issues to call the office. Nothing further needed  ?

## 2022-03-08 ENCOUNTER — Telehealth: Payer: Self-pay | Admitting: Pulmonary Disease

## 2022-03-08 DIAGNOSIS — J441 Chronic obstructive pulmonary disease with (acute) exacerbation: Secondary | ICD-10-CM

## 2022-03-08 NOTE — Telephone Encounter (Signed)
Noted. Obtained paperwork from [facility] box. Nothing further needed  ?

## 2022-03-08 NOTE — Telephone Encounter (Signed)
Called and walked patient through the [place] paperwork for breztri. She will drop off application today and pick up flutter valve at office.  ? ?Nothing further needed  ?
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
## 2022-03-16 ENCOUNTER — Telehealth: Payer: Self-pay | Admitting: *Deleted

## 2022-03-16 ENCOUNTER — Other Ambulatory Visit: Payer: Self-pay | Admitting: Internal Medicine

## 2022-03-16 ENCOUNTER — Other Ambulatory Visit: Payer: Self-pay | Admitting: Physician Assistant

## 2022-03-16 NOTE — Telephone Encounter (Signed)
Ortho bundle 1 year call completed. ?

## 2022-03-16 NOTE — Telephone Encounter (Signed)
Attempted 1 year call to patient; no answer and left VM requesting call back. 

## 2022-03-22 DIAGNOSIS — J449 Chronic obstructive pulmonary disease, unspecified: Secondary | ICD-10-CM

## 2022-03-22 DIAGNOSIS — E1159 Type 2 diabetes mellitus with other circulatory complications: Secondary | ICD-10-CM

## 2022-03-22 DIAGNOSIS — E785 Hyperlipidemia, unspecified: Secondary | ICD-10-CM

## 2022-03-22 DIAGNOSIS — M1990 Unspecified osteoarthritis, unspecified site: Secondary | ICD-10-CM

## 2022-03-22 DIAGNOSIS — Z87891 Personal history of nicotine dependence: Secondary | ICD-10-CM

## 2022-03-22 DIAGNOSIS — F319 Bipolar disorder, unspecified: Secondary | ICD-10-CM

## 2022-03-22 DIAGNOSIS — I10 Essential (primary) hypertension: Secondary | ICD-10-CM | POA: Diagnosis not present

## 2022-03-27 ENCOUNTER — Ambulatory Visit: Payer: Medicare HMO | Admitting: Physician Assistant

## 2022-03-27 DIAGNOSIS — M19071 Primary osteoarthritis, right ankle and foot: Secondary | ICD-10-CM

## 2022-03-27 DIAGNOSIS — M5030 Other cervical disc degeneration, unspecified cervical region: Secondary | ICD-10-CM

## 2022-03-27 DIAGNOSIS — M7541 Impingement syndrome of right shoulder: Secondary | ICD-10-CM

## 2022-03-27 DIAGNOSIS — Z8659 Personal history of other mental and behavioral disorders: Secondary | ICD-10-CM

## 2022-03-27 DIAGNOSIS — Z87891 Personal history of nicotine dependence: Secondary | ICD-10-CM

## 2022-03-27 DIAGNOSIS — N1832 Chronic kidney disease, stage 3b: Secondary | ICD-10-CM

## 2022-03-27 DIAGNOSIS — J479 Bronchiectasis, uncomplicated: Secondary | ICD-10-CM

## 2022-03-27 DIAGNOSIS — I7301 Raynaud's syndrome with gangrene: Secondary | ICD-10-CM

## 2022-03-27 DIAGNOSIS — Z96641 Presence of right artificial hip joint: Secondary | ICD-10-CM

## 2022-03-27 DIAGNOSIS — Z79899 Other long term (current) drug therapy: Secondary | ICD-10-CM

## 2022-03-27 DIAGNOSIS — I10 Essential (primary) hypertension: Secondary | ICD-10-CM

## 2022-03-27 DIAGNOSIS — M170 Bilateral primary osteoarthritis of knee: Secondary | ICD-10-CM

## 2022-03-27 DIAGNOSIS — Z8639 Personal history of other endocrine, nutritional and metabolic disease: Secondary | ICD-10-CM

## 2022-03-27 DIAGNOSIS — M5136 Other intervertebral disc degeneration, lumbar region: Secondary | ICD-10-CM

## 2022-03-27 DIAGNOSIS — E782 Mixed hyperlipidemia: Secondary | ICD-10-CM

## 2022-03-27 DIAGNOSIS — M19042 Primary osteoarthritis, left hand: Secondary | ICD-10-CM

## 2022-03-27 DIAGNOSIS — M0579 Rheumatoid arthritis with rheumatoid factor of multiple sites without organ or systems involvement: Secondary | ICD-10-CM

## 2022-03-31 ENCOUNTER — Telehealth: Payer: Self-pay | Admitting: Student

## 2022-03-31 ENCOUNTER — Telehealth: Payer: Self-pay | Admitting: Pharmacist

## 2022-03-31 MED ORDER — PREDNISONE 20 MG PO TABS: 40.0000 mg | ORAL_TABLET | Freq: Every day | ORAL | 0 refills | Status: AC

## 2022-03-31 MED ORDER — PREDNISONE 20 MG PO TABS: 40.0000 mg | ORAL_TABLET | Freq: Every day | ORAL | 0 refills | Status: DC

## 2022-03-31 NOTE — Chronic Care Management (AMB) (Unsigned)
Chronic Care Management Pharmacy Assistant   Name: Desiree [name]  MRN: [id number] DOB: 1951-08-23  Reason for Encounter: Disease State   Conditions to be addressed/monitored: HTN  Recent office visits:  03/03/22 Desiree Peaches, LPN - Patient presented for [facility] Annual Wellness exam via phone. No medication changes.  Recent consult visits:  None  Hospital visits:  None in previous 6 months  Medications: Outpatient Encounter Medications as of 03/31/2022  Medication Sig   Accu-Chek Softclix Lancets lancets Use to test blood sugar daily. Dx:e11.9   Acetaminophen (TYLENOL PO) Take by mouth as needed.   albuterol (VENTOLIN HFA) 108 (90 Base) MCG/ACT inhaler Inhale 2 puffs into the lungs every 6 (six) hours as needed for wheezing or shortness of breath.   albuterol (VENTOLIN HFA) 108 (90 Base) MCG/ACT inhaler Inhale 2 puffs into the lungs every 6 (six) hours as needed for wheezing or shortness of breath.   Alcohol Swabs (DROPSAFE ALCOHOL PREP) 70 % PADS USE AS DIRECTED   amLODipine (NORVASC) 5 MG tablet TAKE 1 TABLET EVERY MORNING   ASPIRIN 81 PO Take 81 mg by mouth daily.   azelastine (ASTELIN) 0.1 % nasal spray Place 2 sprays into both nostrils 2 (two) times daily. Use in each nostril as directed   Blood Glucose Monitoring Suppl (TRUE METRIX AIR GLUCOSE METER) w/Device KIT Use as directed DX:11.9   Budeson-Glycopyrrol-Formoterol (BREZTRI AEROSPHERE) 160-9-4.8 MCG/ACT AERO Inhale 2 puffs into the lungs in the morning and at bedtime.   Carboxymethylcellulose Sodium (THERATEARS) 0.25 % SOLN Place 1 drop into both eyes 3 (three) times daily as needed (dry eyes).   cetirizine (ZYRTEC) 10 MG tablet Take 10 mg by mouth at bedtime.    diclofenac sodium (VOLTAREN) 1 % GEL APPLY 2-4 GRAMS TO AFFECTED JOINTS UP TO FOUR TIMES DAILY (Patient taking differently: Apply 2-4 g topically 4 (four) times daily as needed (pain).)   famotidine (PEPCID) 40 MG tablet TAKE 1 TABLET EVERY DAY   feeding  supplement, ENSURE ENLIVE, (ENSURE ENLIVE) LIQD Take 237 mLs by mouth 3 (three) times daily between meals.   fluticasone (FLONASE) 50 MCG/ACT nasal spray Place 2 sprays into both nostrils at bedtime.   glucose blood (TRUE METRIX BLOOD GLUCOSE TEST) test strip Use as instructed DX:11.9   hydroxychloroquine (PLAQUENIL) 200 MG tablet TAKE 1 TABLET EVERY DAY   linaclotide (LINZESS) 145 MCG CAPS capsule Take 145 mcg by mouth daily.   losartan (COZAAR) 25 MG tablet TAKE 1 TABLET EVERY MORNING   montelukast (SINGULAIR) 10 MG tablet TAKE 1 TABLET AT BEDTIME   Multiple Vitamins-Minerals (CENTRUM SILVER 50+WOMEN) TABS Take 1 tablet by mouth daily.   nitroGLYCERIN (NITRO-BID) 2 % ointment APPLY 1/4 INCH TOPICALLY TO AFFECTED FINGERTIPS 3 TIMES A DAY   ondansetron (ZOFRAN-ODT) 4 MG disintegrating tablet DISSOLVE 1 TABLET ON THE TONGUE EVERY 8 HOURS AS NEEDED FOR NAUSEA OR VOMITING   pravastatin (PRAVACHOL) 40 MG tablet TAKE 1 TABLET EVERY DAY   predniSONE (DELTASONE) 10 MG tablet Take 4 tablets (40 mg total) by mouth daily with breakfast.   Respiratory Therapy Supplies (FLUTTER) DEVI 1 Device by Does not apply route as directed.   triamcinolone cream (KENALOG) 0.1 % Apply 1 application topically daily as needed (for itching of affected areas).    TRUEplus Lancets 33G MISC Use as directed DX:11.9   No facility-administered encounter medications on file as of 03/31/2022.  Reviewed chart prior to disease state call. Spoke with patient regarding BP  Recent Office Vitals: BP  Readings from Last 3 Encounters:  01/02/22 (!) 100/58  11/11/21 102/70  10/31/21 112/60   Pulse Readings from Last 3 Encounters:  01/02/22 85  11/11/21 89  10/31/21 91    Wt Readings from Last 3 Encounters:  03/03/22 165 lb (74.8 kg)  01/02/22 167 lb (75.8 kg)  11/11/21 167 lb (75.8 kg)     Kidney Function Lab Results  Component Value Date/Time   CREATININE 1.62 (H) 11/11/2021 08:24 AM   CREATININE 1.77 (H) 10/07/2021 08:45  AM   GFR 28.97 (L) 10/07/2021 08:45 AM   GFRNONAA 35 (L) 12/18/2020 01:29 AM   GFRAA 43 02/09/2021 12:00 AM       Latest Ref Rng & Units 11/11/2021    8:24 AM 10/07/2021    8:45 AM 06/22/2021    1:52 PM  BMP  Glucose 70 - 99 mg/dL 87  85  72   BUN 8 - 27 mg/dL 27  25  19    Creatinine 0.57 - 1.00 mg/dL 1.62  1.77  1.71   BUN/Creat Ratio 12 - 28 17     Sodium 134 - 144 mmol/L 137  138  138   Potassium 3.5 - 5.2 mmol/L 4.6  4.6  4.2   Chloride 96 - 106 mmol/L 100  101  102   CO2 20 - 29 mmol/L 24  30  28    Calcium 8.7 - 10.3 mg/dL 9.4  9.9  10.0     Current antihypertensive regimen:  Amlodipine 5 mg 1 tablet daily - Appropriate, Effective, Safe, Accessible Losartan 25 mg 1 tablet daily - Appropriate, Effective, Safe, Accessible How often are you checking your Blood Pressure? {CHL HP BP Monitoring Frequency:[phone]} Current home BP readings: *** What recent interventions/DTPs have been made by any provider to improve Blood Pressure control since last CPP Visit: Per MP patient was having some dizziness/lightheadedness and had been having some difficult in figuring out how to work her machine. She reports Any recent hospitalizations or ED visits since last visit with CPP? {yes/no:20286} What diet changes have been made to improve Blood Pressure Control?  *** What exercise is being done to improve your Blood Pressure Control?  ***  Adherence Review: Is the patient currently on ACE/ARB medication? {yes/no:20286} Does the patient have >5 day gap between last estimated fill dates? {yes/no:20286}  Office appt offered for education on home cuff patient reports  Notes: Per Chart notes on 08/30/21 and Patient assistance spreadsheet, Patient confirmed receipt of Patient Assistance paperwork for [employer] but decided not to pursue as she wasn't finding it difficult to afford with her ins at that time. Inquired if that is still the case she reports  Care Gaps: Foot Exam - Overdue PNA  Vaccine - Postponed Eye exam - Postponed COVID Booster - Postponed Zoster Vaccine - Postponed Mammogram - Postponed DEXA - Postponed CCM- 9/23 AWV- 5/23 BP- 100/58 ( 01/02/22) Lab Results  Component Value Date   HGBA1C 6.2 10/07/2021    Star Rating Drugs: Losartan 25 mg - Last filled 01/23/22 90 DS at [facility] Pravastatin 40 mg - Last filled 02/15/22 90 DS   Patient Assistance: [place] renewal in process with Pulmonology office [place] Pharmacist Assistant [phone]

## 2022-03-31 NOTE — Telephone Encounter (Signed)
Spoke with pt who was inquiring if she can use her rescue inhaler more often because of the smoke was in the air. While speaking with pt she became SOB. Pt stated that she does use the Albuterol and Breztri as needed. Pt states she is no longer taking prednisone. Dr. Silas Flood please advise.

## 2022-03-31 NOTE — Telephone Encounter (Signed)
Can use albuterol every 4 hours. Ok for every 2 hours for half a day. If needing every 2 hours for more than half a day needs to go to ED. Will call in short course of prednisone 40 mg daily x 5 days to see if helps over weekend.

## 2022-03-31 NOTE — Telephone Encounter (Signed)
Spoke with pt and reviewed Albuterol directions with her. Pt stated understanding. Pt also instructed to go ED if breathing got worse in any way. Pt again stated understanding. Nothing further needed at this time.

## 2022-04-04 ENCOUNTER — Ambulatory Visit: Payer: Medicare HMO | Admitting: Orthopaedic Surgery

## 2022-04-05 ENCOUNTER — Ambulatory Visit: Payer: Medicare HMO | Admitting: Internal Medicine

## 2022-04-06 ENCOUNTER — Other Ambulatory Visit: Payer: Self-pay | Admitting: *Deleted

## 2022-04-06 MED ORDER — FLUTICASONE PROPIONATE 50 MCG/ACT NA SUSP: 2.0000 | Freq: Every day | NASAL | 3 refills | Status: DC

## 2022-04-20 ENCOUNTER — Other Ambulatory Visit: Payer: Self-pay | Admitting: Rheumatology

## 2022-04-20 ENCOUNTER — Telehealth: Payer: Self-pay | Admitting: Internal Medicine

## 2022-04-20 ENCOUNTER — Other Ambulatory Visit: Payer: Self-pay | Admitting: Internal Medicine

## 2022-04-20 MED ORDER — NITRO-BID 2 % TD OINT: TOPICAL_OINTMENT | TRANSDERMAL | 0 refills | Status: DC

## 2022-04-20 NOTE — Telephone Encounter (Signed)
Next Visit: 06/14/2022   Last Visit: 10/26/2021  Dx: Raynaud's disease with gangrene   Current Dose per office note on 10/26/2021: not discussed   Last Fill: 10/26/2021   Okay to refill Nitroglycerine ointment?

## 2022-04-20 NOTE — Telephone Encounter (Signed)
Patient called the office requesting a refill of Nitroglycerin 2% ointment be sent to [facility].

## 2022-04-20 NOTE — Telephone Encounter (Signed)
Pt is calling and need a refill on amLODipine (NORVASC) 5 MG tablet  [facility] - [place], [place] - [address] Phone:  [phone]  Fax:  [phone]

## 2022-04-21 NOTE — Telephone Encounter (Signed)
Rx sent to pharmacy   

## 2022-04-24 ENCOUNTER — Telehealth: Payer: Self-pay | Admitting: *Deleted

## 2022-04-24 NOTE — Telephone Encounter (Signed)
Goodrx has coupons available for ntiro-bid ointment.    Devki, Do you have any other recommendations?

## 2022-04-24 NOTE — Telephone Encounter (Signed)
[facility] called, Nitro-bid ointment not covered by insurance; however, Isosoreide Mononitrate 20 mg tablets is covered. Pharmacy wants to know if patient can switch to tablets. Please advise.

## 2022-04-27 NOTE — Telephone Encounter (Signed)
Spoke with patient regarding Nitro-bid ointment no being covered by her insurance. Patient advised that she can get it with a good rx coupon. Patient advised of options and cost. Patient states that is not affordable for her. Please advise.

## 2022-04-27 NOTE — Telephone Encounter (Signed)
Patient should fill this at local pharmacy so they can use GoodRx coupon. [facility] does not bill Goodrx to my knowledge.  Cheapest option is [facility] ($42.63 for 30g tube) and [facility] $77.46 for 60g tube)  Submitted prior authorization to [facility] for Nitro Bid ointment just to cover all bases of coverage.  Key: [id number]  Chesley Mires, PharmD, MPH, BCPS, CPP Clinical Pharmacist (Rheumatology and Pulmonology)

## 2022-04-28 ENCOUNTER — Ambulatory Visit: Payer: Medicare HMO | Admitting: Pulmonary Disease

## 2022-04-28 ENCOUNTER — Telehealth: Payer: Self-pay | Admitting: Pulmonary Disease

## 2022-04-28 ENCOUNTER — Encounter: Payer: Self-pay | Admitting: Pulmonary Disease

## 2022-04-28 VITALS — BP 116/62 | HR 101 | Temp 99.1°F | Ht 62.0 in | Wt 167.0 lb

## 2022-04-28 DIAGNOSIS — J441 Chronic obstructive pulmonary disease with (acute) exacerbation: Secondary | ICD-10-CM | POA: Diagnosis not present

## 2022-04-28 DIAGNOSIS — J479 Bronchiectasis, uncomplicated: Secondary | ICD-10-CM

## 2022-04-28 MED ORDER — DOXYCYCLINE HYCLATE 100 MG PO TABS: 100.0000 mg | ORAL_TABLET | Freq: Two times a day (BID) | ORAL | 0 refills | Status: AC

## 2022-04-28 MED ORDER — PREDNISONE 20 MG PO TABS: 20.0000 mg | ORAL_TABLET | Freq: Every day | ORAL | 0 refills | Status: AC

## 2022-04-28 NOTE — Telephone Encounter (Signed)
Received response regarding appeal. DENIAL is [place]. Nitro-bid ointment will not be covered by patient's insurance. Will further discuss at OV  Chesley Mires, PharmD, MPH, BCPS, CPP Clinical Pharmacist (Rheumatology and Pulmonology)

## 2022-04-28 NOTE — Telephone Encounter (Signed)
Please schedule sooner office visit for the patient to discuss swtiching to isosorbide mononitrate tablets.  Please schedule morning appointment while Devki is in the office.

## 2022-04-28 NOTE — Patient Instructions (Signed)
Nice to see you again  Take prednisone 20 mg daily each day for the next 5 days  Take doxycycline 100 mg tablet twice a day for 14 days  These medicines will help clear up the cough and help with your shortness of breath  I worry the air quality is the main driver of your symptoms  Use Breztri with a spacer, 2 puffs twice a day every day.  Rinse out your mouth after every use.  You can use a spacer with your rescue inhaler as well.  Return to clinic in 3 months or sooner as needed with Dr. Judeth Horn
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
## 2022-04-28 NOTE — Telephone Encounter (Signed)
Spoke with patient and she has been schedule on 05/04/2022 at 10:20 am.

## 2022-04-28 NOTE — Telephone Encounter (Signed)
Patient was seen by Dr. Judeth Horn today. She had asked about the status of her patient assistance for Markus Daft that was submitted back in May 2023. I called [employer] at [phone] and was informed via the automated system that they had an application on file from October 2022 but it was missing information. I attempted to speak with a rep but I was on hold for over 20 minutes without speaking with anyone.   I will call [employer] next week to check on the status again next week.

## 2022-04-28 NOTE — Telephone Encounter (Signed)
Received a fax regarding Prior Authorization from [facility] for  Nitro-Bid . Authorization has been DENIED because it is being used for an off-label use that is not medically accepted. Medicare requires medications be used for on-label use. Off-label use is medically accepted when there is proof in one or more of the drug guides that the drug works for your condition. We look at the two major drug guides (compendia): the DRUGDEX Information System and the [facility] Formulary Service Drug Information (AHFS-DI). Appeal submitted via CMM with attached literature.  PA Case ID: [id number]  Patient can try isosorbide mononitrate tablets as an alternative per insurance.  Chesley Mires, PharmD, MPH, BCPS, CPP Clinical Pharmacist (Rheumatology and Pulmonology)

## 2022-05-01 ENCOUNTER — Other Ambulatory Visit: Payer: Self-pay | Admitting: Pulmonary Disease

## 2022-05-01 ENCOUNTER — Telehealth: Payer: Self-pay | Admitting: Pulmonary Disease

## 2022-05-01 ENCOUNTER — Ambulatory Visit: Payer: Medicare HMO | Admitting: Internal Medicine

## 2022-05-01 NOTE — Telephone Encounter (Signed)
Called and spoke with patient who states that she wants to know when her medication is supposed to help her get any relief, she says the doxycycline and prednisone isnt helping much. Advised her that it has only been 3 days since she started taking the prescription and one of them she is supposed to take for 14 days. Advised her that we need to give it more time to get in her system and start helping her. She expressed understanding. Nothing further needed at this time.     Instructions   Return in about 3 months (around 07/29/2022). Nice to see you again  Take prednisone 20 mg daily each day for the next 5 days  Take doxycycline 100 mg tablet twice a day for 14 days   These medicines will help clear up the cough and help with your shortness of breath   I worry the air quality is the main driver of your symptoms   Use Breztri with a spacer, 2 puffs twice a day every day.  Rinse out your mouth after every use.  You can use a spacer with your rescue inhaler as well.   Return to clinic in 3 months or sooner as needed with Dr. Judeth Horn

## 2022-05-02 ENCOUNTER — Ambulatory Visit: Payer: Medicare HMO | Admitting: Podiatry

## 2022-05-02 DIAGNOSIS — B351 Tinea unguium: Secondary | ICD-10-CM

## 2022-05-02 DIAGNOSIS — L84 Corns and callosities: Secondary | ICD-10-CM | POA: Diagnosis not present

## 2022-05-02 DIAGNOSIS — E1151 Type 2 diabetes mellitus with diabetic peripheral angiopathy without gangrene: Secondary | ICD-10-CM

## 2022-05-02 NOTE — Patient Instructions (Signed)
Look for urea 40% cream or ointment and apply to the thickened dry skin / calluses. This can be bought over the counter, at a pharmacy or online such as [employer].  

## 2022-05-03 NOTE — Progress Notes (Signed)
  Subjective:  [name], female    DOB: Feb 24, 1951,  MRN: [id number]  Chief Complaint  Patient presents with   Nail Problem    Nail and callus care     71 y.o. female presents with the above complaint. History confirmed with patient.  She presents with painful calluses and a painful nail, her diabetes is well controlled she says  Objective:  Physical Exam: warm, good capillary refill, no trophic changes or ulcerative lesions, weakly palpable DP and PT pulses, and thickened elongated mycotic right second toenail, there are multiple hyperkeratotic painful lesions as follows: Right fifth toe, second medial toe, submetatarsal 5 and submetatarsal 2: Left submetatarsal 1 and submetatarsal 5.  Assessment:   1. Onychomycosis of multiple toenails with type 2 diabetes mellitus and peripheral angiopathy (HCC)   2. Callus   3. Diabetes mellitus type 2 with peripheral artery disease (HCC)      Plan:  Patient was evaluated and treated and all questions answered.  Patient educated on diabetes. Discussed proper diabetic foot care and discussed risks and complications of disease. Educated patient in depth on reasons to return to the office immediately should he/she discover anything concerning or new on the feet. All questions answered. Discussed proper shoes as well.   Discussed the etiology and treatment options for the condition in detail with the patient. Educated patient on the topical and oral treatment options for mycotic nails. Recommended debridement of the nails today. Sharp and mechanical debridement performed of all painful and mycotic nails today. Nails debrided in length and thickness using a nail nipper to level of comfort. Discussed treatment options including appropriate shoe gear. Follow up as needed for painful nails.   All symptomatic hyperkeratoses were safely debrided with a sterile #15 blade to patient's level of comfort without incident. We discussed  preventative and palliative care of these lesions including supportive and accommodative shoegear, padding, prefabricated and custom molded accommodative orthoses, use of a pumice stone and lotions/creams daily.   No follow-ups on file.

## 2022-05-04 ENCOUNTER — Ambulatory Visit: Payer: Medicare HMO | Admitting: Physician Assistant

## 2022-05-04 ENCOUNTER — Encounter: Payer: Self-pay | Admitting: Physician Assistant

## 2022-05-04 VITALS — BP 132/76 | HR 94 | Ht 62.0 in | Wt 170.2 lb

## 2022-05-04 DIAGNOSIS — M19071 Primary osteoarthritis, right ankle and foot: Secondary | ICD-10-CM

## 2022-05-04 DIAGNOSIS — Z8639 Personal history of other endocrine, nutritional and metabolic disease: Secondary | ICD-10-CM

## 2022-05-04 DIAGNOSIS — M19072 Primary osteoarthritis, left ankle and foot: Secondary | ICD-10-CM

## 2022-05-04 DIAGNOSIS — M5030 Other cervical disc degeneration, unspecified cervical region: Secondary | ICD-10-CM | POA: Diagnosis not present

## 2022-05-04 DIAGNOSIS — N1832 Chronic kidney disease, stage 3b: Secondary | ICD-10-CM

## 2022-05-04 DIAGNOSIS — M19042 Primary osteoarthritis, left hand: Secondary | ICD-10-CM

## 2022-05-04 DIAGNOSIS — J479 Bronchiectasis, uncomplicated: Secondary | ICD-10-CM

## 2022-05-04 DIAGNOSIS — Z79899 Other long term (current) drug therapy: Secondary | ICD-10-CM | POA: Diagnosis not present

## 2022-05-04 DIAGNOSIS — Z8659 Personal history of other mental and behavioral disorders: Secondary | ICD-10-CM

## 2022-05-04 DIAGNOSIS — I7301 Raynaud's syndrome with gangrene: Secondary | ICD-10-CM | POA: Diagnosis not present

## 2022-05-04 DIAGNOSIS — M0579 Rheumatoid arthritis with rheumatoid factor of multiple sites without organ or systems involvement: Secondary | ICD-10-CM | POA: Diagnosis not present

## 2022-05-04 DIAGNOSIS — M7541 Impingement syndrome of right shoulder: Secondary | ICD-10-CM

## 2022-05-04 DIAGNOSIS — M170 Bilateral primary osteoarthritis of knee: Secondary | ICD-10-CM

## 2022-05-04 DIAGNOSIS — M19041 Primary osteoarthritis, right hand: Secondary | ICD-10-CM | POA: Diagnosis not present

## 2022-05-04 DIAGNOSIS — Z96641 Presence of right artificial hip joint: Secondary | ICD-10-CM

## 2022-05-04 DIAGNOSIS — M5136 Other intervertebral disc degeneration, lumbar region: Secondary | ICD-10-CM

## 2022-05-04 DIAGNOSIS — I10 Essential (primary) hypertension: Secondary | ICD-10-CM

## 2022-05-04 DIAGNOSIS — E782 Mixed hyperlipidemia: Secondary | ICD-10-CM

## 2022-05-04 DIAGNOSIS — Z87891 Personal history of nicotine dependence: Secondary | ICD-10-CM

## 2022-05-04 MED ORDER — NITRO-BID 2 % TD OINT: TOPICAL_OINTMENT | TRANSDERMAL | 0 refills | Status: DC

## 2022-05-04 NOTE — Progress Notes (Signed)
Patient provided with print out of Nitrobid ointent Goodrx for 30g tube.She is not due for fill now but has been advised to stop by pharmacy with this coupon when she is due for fill. She verbalized understanding  BIN: [id number] PCN: [id number] Group: [id number] Member ID:  [id number]  Chesley Mires, PharmD, MPH, BCPS, CPP Clinical Pharmacist (Rheumatology and Pulmonology)

## 2022-05-04 NOTE — Progress Notes (Signed)
WBC count is elevated-13.2.  absolute lymphocytes and monocytes are elevated.  Please clarify if the patient has had any recent infections.

## 2022-05-05 ENCOUNTER — Telehealth: Payer: Self-pay | Admitting: Internal Medicine

## 2022-05-05 DIAGNOSIS — N183 Chronic kidney disease, stage 3 unspecified: Secondary | ICD-10-CM

## 2022-05-05 DIAGNOSIS — E1169 Type 2 diabetes mellitus with other specified complication: Secondary | ICD-10-CM

## 2022-05-05 LAB — CBC WITH DIFFERENTIAL/PLATELET
Absolute Monocytes: 1518 cells/uL — ABNORMAL HIGH (ref 200–950)
Basophils Absolute: 106 cells/uL (ref 0–200)
Basophils Relative: 0.8 %
Eosinophils Absolute: 251 cells/uL (ref 15–500)
Eosinophils Relative: 1.9 %
HCT: 38.7 % (ref 35.0–45.0)
Hemoglobin: 12.9 g/dL (ref 11.7–15.5)
Lymphs Abs: 5861 cells/uL — ABNORMAL HIGH (ref 850–3900)
MCH: 29.8 pg (ref 27.0–33.0)
MCHC: 33.3 g/dL (ref 32.0–36.0)
MCV: 89.4 fL (ref 80.0–100.0)
MPV: 10 fL (ref 7.5–12.5)
Monocytes Relative: 11.5 %
Neutro Abs: 5465 cells/uL (ref 1500–7800)
Neutrophils Relative %: 41.4 %
Platelets: 234 10*3/uL (ref 140–400)
RBC: 4.33 10*6/uL (ref 3.80–5.10)
RDW: 13.2 % (ref 11.0–15.0)
Total Lymphocyte: 44.4 %
WBC: 13.2 10*3/uL — ABNORMAL HIGH (ref 3.8–10.8)

## 2022-05-05 LAB — COMPLETE METABOLIC PANEL WITH GFR
AG Ratio: 1.6 (calc) (ref 1.0–2.5)
ALT: 14 U/L (ref 6–29)
AST: 19 U/L (ref 10–35)
Albumin: 4.3 g/dL (ref 3.6–5.1)
Alkaline phosphatase (APISO): 56 U/L (ref 37–153)
BUN/Creatinine Ratio: 21 (calc) (ref 6–22)
BUN: 39 mg/dL — ABNORMAL HIGH (ref 7–25)
CO2: 28 mmol/L (ref 20–32)
Calcium: 9.4 mg/dL (ref 8.6–10.4)
Chloride: 103 mmol/L (ref 98–110)
Creat: 1.9 mg/dL — ABNORMAL HIGH (ref 0.60–1.00)
Globulin: 2.7 g/dL (calc) (ref 1.9–3.7)
Glucose, Bld: 74 mg/dL (ref 65–99)
Potassium: 4.3 mmol/L (ref 3.5–5.3)
Sodium: 141 mmol/L (ref 135–146)
Total Bilirubin: 0.3 mg/dL (ref 0.2–1.2)
Total Protein: 7 g/dL (ref 6.1–8.1)
eGFR: 28 mL/min/{1.73_m2} — ABNORMAL LOW (ref >=60)

## 2022-05-05 NOTE — Telephone Encounter (Signed)
Last Ov 10/31/21 Please advise

## 2022-05-05 NOTE — Progress Notes (Signed)
Creatinine is elevated-1.90.  GFR is low-28.  Please notify the patient.  Please clarify if she has been taking any NSAIDs? She should avoid all NSAIDs. Please forward results to PCP and nephrologist.

## 2022-05-05 NOTE — Telephone Encounter (Signed)
Pt had an appt with the Rheumatoid doctor and wants to know if MD would like her to come in to go over the results. Pt also stated she was informed that her white blood cell count was a little higher than usual.  Pt is already scheduled to come in on 06/28/22.  Please advise.  [phone]

## 2022-05-07 NOTE — Telephone Encounter (Signed)
I reviewed DrD notes and labs   Advise   lab in 2-3 weeks  and then  move up  Sept appt  to go over  results  in office .  Please order  future cbcdiff, BMP, Hg a1c and urine micro albumin ratio   dx: dm and CKD

## 2022-05-08 ENCOUNTER — Other Ambulatory Visit: Payer: Self-pay

## 2022-05-08 DIAGNOSIS — N183 Chronic kidney disease, stage 3 unspecified: Secondary | ICD-10-CM

## 2022-05-08 DIAGNOSIS — E1122 Type 2 diabetes mellitus with diabetic chronic kidney disease: Secondary | ICD-10-CM

## 2022-05-09 ENCOUNTER — Ambulatory Visit (INDEPENDENT_AMBULATORY_CARE_PROVIDER_SITE_OTHER): Payer: Medicare HMO

## 2022-05-09 DIAGNOSIS — E1151 Type 2 diabetes mellitus with diabetic peripheral angiopathy without gangrene: Secondary | ICD-10-CM

## 2022-05-09 NOTE — Progress Notes (Signed)
Patient presents to the office today for diabetic shoe and insole measuring.  Patient was measured with brannock device to determine size and width for 1 pair of extra depth shoes and foam casted for 3 pair of insoles.   Documentation of medical necessity will be sent to patient's treating diabetic doctor to verify and sign.   Patient's diabetic provider: Berniece Andreas, MD  Shoes and insoles will be ordered at that time and patient will be notified for an appointment for fitting when they arrive.   Shoe size (per patient): 8.5 medium women's   Brannock measurement: 8.5 medium  Patient shoe selection-   1st choice:[id number] Apex Boss Runner  2nd choice:  [address] [employer] [place]  Shoe size ordered: 8.5 medium

## 2022-05-10 NOTE — Progress Notes (Signed)
Should avoid all oral nsaids  topicals ok.

## 2022-05-10 NOTE — Telephone Encounter (Signed)
Patient informed of message and has scheduled lab visit 05/30/22

## 2022-05-11 ENCOUNTER — Telehealth: Payer: Self-pay | Admitting: Pharmacist

## 2022-05-11 ENCOUNTER — Other Ambulatory Visit: Payer: Self-pay

## 2022-05-11 MED ORDER — LOSARTAN POTASSIUM 25 MG PO TABS: ORAL_TABLET | ORAL | 0 refills | Status: DC

## 2022-05-11 NOTE — Telephone Encounter (Signed)
Hi,   Can you please send a refill of her losartan to her mail order pharmacy? She is running low.   Thanks!  Maddie

## 2022-05-11 NOTE — Chronic Care Management (AMB) (Signed)
Chronic Care Management Pharmacy Assistant   Name: Desiree [name]  MRN: [id number] DOB: Mar 10, 1951  Reason for Encounter: Reschedule follow up with MP due to Case Study scheduling conflict  Recent office visits:  None   Recent consult visits:  05/09/22 Abdul-Mutakallim, Joycelyn Schmid, RN ([facility]) - Patient presented for Diabetes mellitus type 2 with PAD. Shoes and insoles ordered. No medication changes.  05/04/22 Ofilia Neas, PA-C (Rheumatology) - Patient presented for Rheumatoid arthritis with rheumatoid factor of multiple sites without organ or systems involvement and other concerns. No medication changes.  05/02/22 [name], DPM (Podiatry) - Patient presented for Onychomycosis of multiple toenails with type 2 diabetes mellitus and peripheral angiopathy and other concerns. No medication changes.  04/28/22 Hunsucker, Bonna Gains, MD (Pulmonary Disease) - Patient presented for COPD exacerbation and other concerns. Prescribed Doxycycline. Prescribed Prednisone. Changed Albuterol.  Hospital visits:  None in previous 6 months  Medications: Outpatient Encounter Medications as of 05/11/2022  Medication Sig   Accu-Chek Softclix Lancets lancets Use to test blood sugar daily. Dx:e11.9   Acetaminophen (TYLENOL PO) Take by mouth as needed.   albuterol (VENTOLIN HFA) 108 (90 Base) MCG/ACT inhaler Inhale 2 puffs into the lungs every 6 (six) hours as needed for wheezing or shortness of breath.   Alcohol Swabs (DROPSAFE ALCOHOL PREP) 70 % PADS USE AS DIRECTED   amLODipine (NORVASC) 5 MG tablet TAKE 1 TABLET EVERY MORNING   ASPIRIN 81 PO Take 81 mg by mouth daily.   azelastine (ASTELIN) 0.1 % nasal spray USE 2 SPRAYS IN EACH NOSTRIL TWICE DAILY AS DIRECTED   Blood Glucose Monitoring Suppl (TRUE METRIX AIR GLUCOSE METER) w/Device KIT Use as directed DX:11.9   Budeson-Glycopyrrol-Formoterol (BREZTRI AEROSPHERE) 160-9-4.8 MCG/ACT AERO Inhale 2 puffs into the lungs in the morning and at  bedtime.   Carboxymethylcellulose Sodium (THERATEARS) 0.25 % SOLN Place 1 drop into both eyes 3 (three) times daily as needed (dry eyes). (Patient not taking: Reported on 05/04/2022)   cetirizine (ZYRTEC) 10 MG tablet Take 10 mg by mouth at bedtime.    diclofenac sodium (VOLTAREN) 1 % GEL APPLY 2-4 GRAMS TO AFFECTED JOINTS UP TO FOUR TIMES DAILY (Patient not taking: Reported on 05/04/2022)   doxycycline (VIBRA-TABS) 100 MG tablet Take 1 tablet (100 mg total) by mouth 2 (two) times daily for 14 days.   famotidine (PEPCID) 40 MG tablet TAKE 1 TABLET EVERY DAY   feeding supplement, ENSURE ENLIVE, (ENSURE ENLIVE) LIQD Take 237 mLs by mouth 3 (three) times daily between meals.   fluticasone (FLONASE) 50 MCG/ACT nasal spray Place 2 sprays into both nostrils at bedtime.   glucose blood (TRUE METRIX BLOOD GLUCOSE TEST) test strip USE AS INSTRUCTED   hydroxychloroquine (PLAQUENIL) 200 MG tablet TAKE 1 TABLET EVERY DAY   linaclotide (LINZESS) 145 MCG CAPS capsule Take 145 mcg by mouth daily. (Patient not taking: Reported on 05/04/2022)   losartan (COZAAR) 25 MG tablet TAKE 1 TABLET EVERY MORNING   montelukast (SINGULAIR) 10 MG tablet TAKE 1 TABLET AT BEDTIME   Multiple Vitamins-Minerals (CENTRUM SILVER 50+WOMEN) TABS Take 1 tablet by mouth daily.   nitroGLYCERIN (NITRO-BID) 2 % ointment APPLY 1/4 INCH TOPICALLY TO AFFECTED FINGERTIPS 3 TIMES A DAY   ondansetron (ZOFRAN-ODT) 4 MG disintegrating tablet DISSOLVE 1 TABLET ON THE TONGUE EVERY 8 HOURS AS NEEDED FOR NAUSEA OR VOMITING   pravastatin (PRAVACHOL) 40 MG tablet TAKE 1 TABLET EVERY DAY   Respiratory Therapy Supplies (FLUTTER) DEVI 1 Device by Does not  apply route as directed.   triamcinolone cream (KENALOG) 0.1 % Apply 1 application topically daily as needed (for itching of affected areas).  (Patient not taking: Reported on 05/04/2022)   TRUEplus Lancets 33G MISC Use as directed DX:11.9   No facility-administered encounter medications on file as of  05/11/2022.   Notes: Call to patient to reschedule phone call with [name] Clinical Pharmacist due to schedule conflict/ Patient aware of change to 07/03/22 at 2 pm.  Care Gaps: Zoster Vaccine - Overdue Eye Exam - Overdue Foot Exam - Overdue PNA Vaccine - Overdue COVID Booster - Overdue Mammogram - Postponed BP- 132/76 05/04/22 Lab Results  Component Value Date   HGBA1C 6.2 10/07/2021    Star Rating Drugs: Losartan 25 mg - Last filled 01/23/22 90 DS at [facility] Pravastatin 40 mg - Last filled 02/15/22 90 DS    [place] Clinical Pharmacist Assistant [phone]

## 2022-05-29 DIAGNOSIS — Z1231 Encounter for screening mammogram for malignant neoplasm of breast: Secondary | ICD-10-CM | POA: Diagnosis not present

## 2022-05-30 ENCOUNTER — Other Ambulatory Visit (INDEPENDENT_AMBULATORY_CARE_PROVIDER_SITE_OTHER): Payer: Medicare HMO

## 2022-05-30 DIAGNOSIS — E1122 Type 2 diabetes mellitus with diabetic chronic kidney disease: Secondary | ICD-10-CM

## 2022-05-30 DIAGNOSIS — N183 Chronic kidney disease, stage 3 unspecified: Secondary | ICD-10-CM | POA: Diagnosis not present

## 2022-05-30 LAB — BASIC METABOLIC PANEL
BUN: 31 mg/dL — ABNORMAL HIGH (ref 6–23)
CO2: 29 mEq/L (ref 19–32)
Calcium: 9.7 mg/dL (ref 8.4–10.5)
Chloride: 103 mEq/L (ref 96–112)
Creatinine, Ser: 1.88 mg/dL — ABNORMAL HIGH (ref 0.40–1.20)
GFR: 26.83 mL/min — ABNORMAL LOW (ref >60.00)
Glucose, Bld: 109 mg/dL — ABNORMAL HIGH (ref 70–99)
Potassium: 4.5 mEq/L (ref 3.5–5.1)
Sodium: 139 mEq/L (ref 135–145)

## 2022-05-30 LAB — CBC WITH DIFFERENTIAL/PLATELET
Basophils Absolute: 0.1 10*3/uL (ref 0.0–0.1)
Basophils Relative: 1.1 % (ref 0.0–3.0)
Eosinophils Absolute: 0.7 10*3/uL (ref 0.0–0.7)
Eosinophils Relative: 8.8 % — ABNORMAL HIGH (ref 0.0–5.0)
HCT: 39.9 % (ref 36.0–46.0)
Hemoglobin: 13.2 g/dL (ref 12.0–15.0)
Lymphocytes Relative: 45.6 % (ref 12.0–46.0)
Lymphs Abs: 3.5 10*3/uL (ref 0.7–4.0)
MCHC: 32.9 g/dL (ref 30.0–36.0)
MCV: 90.6 fl (ref 78.0–100.0)
Monocytes Absolute: 0.8 10*3/uL (ref 0.1–1.0)
Monocytes Relative: 9.9 % (ref 3.0–12.0)
Neutro Abs: 2.7 10*3/uL (ref 1.4–7.7)
Neutrophils Relative %: 34.6 % — ABNORMAL LOW (ref 43.0–77.0)
Platelets: 263 10*3/uL (ref 150.0–400.0)
RBC: 4.41 Mil/uL (ref 3.87–5.11)
RDW: 15.2 % (ref 11.5–15.5)
WBC: 7.7 10*3/uL (ref 4.0–10.5)

## 2022-05-30 LAB — MICROALBUMIN / CREATININE URINE RATIO
Creatinine,U: 168.4 mg/dL
Microalb Creat Ratio: 0.9 mg/g (ref 0.0–30.0)
Microalb, Ur: 1.5 mg/dL (ref 0.0–1.9)

## 2022-05-30 LAB — HEMOGLOBIN A1C: Hgb A1c MFr Bld: 6.3 % (ref 4.6–6.5)

## 2022-06-01 NOTE — Progress Notes (Signed)
Results stable  will review at upcoming visit   Diabetes is stable

## 2022-06-06 DIAGNOSIS — Z01419 Encounter for gynecological examination (general) (routine) without abnormal findings: Secondary | ICD-10-CM | POA: Diagnosis not present

## 2022-06-06 DIAGNOSIS — Z6829 Body mass index (BMI) 29.0-29.9, adult: Secondary | ICD-10-CM | POA: Diagnosis not present

## 2022-06-07 ENCOUNTER — Other Ambulatory Visit: Payer: Self-pay | Admitting: Internal Medicine

## 2022-06-12 DIAGNOSIS — N183 Chronic kidney disease, stage 3 unspecified: Secondary | ICD-10-CM | POA: Diagnosis not present

## 2022-06-14 ENCOUNTER — Ambulatory Visit: Payer: Medicare HMO | Admitting: Physician Assistant

## 2022-06-28 ENCOUNTER — Ambulatory Visit: Payer: Medicare HMO | Admitting: Internal Medicine

## 2022-06-29 ENCOUNTER — Other Ambulatory Visit: Payer: Self-pay | Admitting: Internal Medicine

## 2022-06-29 DIAGNOSIS — D631 Anemia in chronic kidney disease: Secondary | ICD-10-CM | POA: Diagnosis not present

## 2022-06-29 DIAGNOSIS — N2581 Secondary hyperparathyroidism of renal origin: Secondary | ICD-10-CM | POA: Diagnosis not present

## 2022-06-29 DIAGNOSIS — I129 Hypertensive chronic kidney disease with stage 1 through stage 4 chronic kidney disease, or unspecified chronic kidney disease: Secondary | ICD-10-CM | POA: Diagnosis not present

## 2022-06-29 DIAGNOSIS — N183 Chronic kidney disease, stage 3 unspecified: Secondary | ICD-10-CM | POA: Diagnosis not present

## 2022-06-29 DIAGNOSIS — E1122 Type 2 diabetes mellitus with diabetic chronic kidney disease: Secondary | ICD-10-CM | POA: Diagnosis not present

## 2022-06-30 ENCOUNTER — Telehealth: Payer: Self-pay | Admitting: Pharmacist

## 2022-06-30 ENCOUNTER — Telehealth: Payer: Self-pay | Admitting: Podiatry

## 2022-06-30 NOTE — Chronic Care Management (AMB) (Signed)
Chronic Care Management Pharmacy Assistant   Name: Desiree [name]  MRN: [id number] DOB: 01-04-1951  06/30/22 APPOINTMENT REMINDER     Patient was reminded to have all medications, supplements and any blood glucose and blood pressure readings available for review with [name], Pharm. D, for telephone visit on 07/03/22 at 2.    Care Gaps: Zoster Vaccine - Overdue Foot Exam - Overdue PNA Vaccine - Overdue COVID Booster - Overdue Flu Vaccine - Overdue Mammogram - Postponed DEXA - Postponed BP- 132/76 05/04/22 AWV- 5/23 Lab Results  Component Value Date   HGBA1C 6.3 05/30/2022     Star Rating Drug: Losartan 25 mg - Last filled 04/2122 90 DS at [facility] Pravastatin 40 mg - Last filled 05/02/22 90 DS at [facility]     Medications: Outpatient Encounter Medications as of 06/30/2022  Medication Sig   Accu-Chek Softclix Lancets lancets Use to test blood sugar daily. Dx:e11.9   Acetaminophen (TYLENOL PO) Take by mouth as needed.   albuterol (VENTOLIN HFA) 108 (90 Base) MCG/ACT inhaler Inhale 2 puffs into the lungs every 6 (six) hours as needed for wheezing or shortness of breath.   Alcohol Swabs (DROPSAFE ALCOHOL PREP) 70 % PADS USE AS DIRECTED   amLODipine (NORVASC) 5 MG tablet TAKE 1 TABLET EVERY MORNING   ASPIRIN 81 PO Take 81 mg by mouth daily.   azelastine (ASTELIN) 0.1 % nasal spray USE 2 SPRAYS IN EACH NOSTRIL TWICE DAILY AS DIRECTED   Blood Glucose Monitoring Suppl (TRUE METRIX AIR GLUCOSE METER) w/Device KIT Use as directed DX:11.9   Budeson-Glycopyrrol-Formoterol (BREZTRI AEROSPHERE) 160-9-4.8 MCG/ACT AERO Inhale 2 puffs into the lungs in the morning and at bedtime.   Carboxymethylcellulose Sodium (THERATEARS) 0.25 % SOLN Place 1 drop into both eyes 3 (three) times daily as needed (dry eyes). (Patient not taking: Reported on 05/04/2022)   cetirizine (ZYRTEC) 10 MG tablet Take 10 mg by mouth at bedtime.    diclofenac sodium (VOLTAREN) 1 % GEL APPLY 2-4 GRAMS TO  AFFECTED JOINTS UP TO FOUR TIMES DAILY (Patient not taking: Reported on 05/04/2022)   famotidine (PEPCID) 40 MG tablet TAKE 1 TABLET EVERY DAY   feeding supplement, ENSURE ENLIVE, (ENSURE ENLIVE) LIQD Take 237 mLs by mouth 3 (three) times daily between meals.   fluticasone (FLONASE) 50 MCG/ACT nasal spray Place 2 sprays into both nostrils at bedtime.   glucose blood (TRUE METRIX BLOOD GLUCOSE TEST) test strip USE AS INSTRUCTED   hydroxychloroquine (PLAQUENIL) 200 MG tablet TAKE 1 TABLET EVERY DAY   linaclotide (LINZESS) 145 MCG CAPS capsule Take 145 mcg by mouth daily. (Patient not taking: Reported on 05/04/2022)   losartan (COZAAR) 25 MG tablet TAKE 1 TABLET EVERY MORNING. KEEP APPT FOR FUTURE REFILLS   montelukast (SINGULAIR) 10 MG tablet TAKE 1 TABLET AT BEDTIME   Multiple Vitamins-Minerals (CENTRUM SILVER 50+WOMEN) TABS Take 1 tablet by mouth daily.   nitroGLYCERIN (NITRO-BID) 2 % ointment APPLY 1/4 INCH TOPICALLY TO AFFECTED FINGERTIPS 3 TIMES A DAY   ondansetron (ZOFRAN-ODT) 4 MG disintegrating tablet DISSOLVE 1 TABLET ON THE TONGUE EVERY 8 HOURS AS NEEDED FOR NAUSEA OR VOMITING   pravastatin (PRAVACHOL) 40 MG tablet TAKE 1 TABLET EVERY DAY   Respiratory Therapy Supplies (FLUTTER) DEVI 1 Device by Does not apply route as directed.   triamcinolone cream (KENALOG) 0.1 % Apply 1 application topically daily as needed (for itching of affected areas).  (Patient not taking: Reported on 05/04/2022)   TRUEplus Lancets 33G MISC Use as directed DX:11.9  No facility-administered encounter medications on file as of 06/30/2022.      [facility] Clinical Pharmacist Assistant [phone]

## 2022-06-30 NOTE — Telephone Encounter (Signed)
Pt called asking if her diabetic shoes were ready yet and that she called and left vm and hasnt heard back yet for anyone yet.

## 2022-07-03 ENCOUNTER — Ambulatory Visit (INDEPENDENT_AMBULATORY_CARE_PROVIDER_SITE_OTHER): Payer: Medicare HMO | Admitting: Pharmacist

## 2022-07-03 DIAGNOSIS — I10 Essential (primary) hypertension: Secondary | ICD-10-CM

## 2022-07-03 DIAGNOSIS — E1169 Type 2 diabetes mellitus with other specified complication: Secondary | ICD-10-CM

## 2022-07-03 NOTE — Progress Notes (Signed)
Chronic Care Management Pharmacy Note  07/11/2022 Name:  Desiree [name] MRN:  [id number] DOB:  1951-01-03  Summary: LDL not at goal < 55  Pt has not started using glucometer or BP cuff Patient continues to have affordability issues with medications  Recommendations/Changes made from today's visit: -Recommend repeat lipid panel and consider increasing to high intensity statin if LDL remains > 70 -Recommended bringing glucometer and BP cuff to office visit with PCP for instructions on how to use -Requested azelastine nasal spray and Proventil generic to be to discount pharmacy for cost savings  Plan: Follow up BP/DM check in in 2-3 weeks Follow up in 4 months  Subjective: Desiree [name] is an 71 y.o. year old female who is a primary patient of [name], [name], MD.  The CCM team was consulted for assistance with disease management and care coordination needs.    Engaged with patient by telephone for follow up visit in response to provider referral for pharmacy case management and/or care coordination services.   Consent to Services:  The patient was given information about Chronic Care Management services, agreed to services, and gave verbal consent prior to initiation of services.  Please see initial visit note for detailed documentation.   Patient Care Team: [name], [name], MD as PCP - General (Internal Medicine) Josue Hector, MD as PCP - Cardiology (Cardiology) [name], MD (Orthopedic Surgery) Cottle, Billey Co., MD as Attending Physician (Psychiatry) [name], MD as Consulting Physician (Gastroenterology) Elmarie Shiley, MD as Consulting Physician (Nephrology) Druscilla Brownie, MD as Consulting Physician (Dermatology) Arvella Nigh, MD as Consulting Physician (Obstetrics and Gynecology) Gean Birchwood, DPM (Inactive) as Consulting Physician (Podiatry) [name], MD as Consulting Physician (Pulmonary Disease) Bo Merino, MD as Consulting Physician  (Rheumatology) Nicholas Lose, MD as Consulting Physician (Hematology and Oncology) Alen Blew as Physician Assistant (Psychiatry) Julian Hy, DO as Consulting Physician (Pulmonary Disease) [name], MD as Consulting Physician (Orthopedic Surgery) Viona Gilmore, [facility] as Pharmacist (Pharmacist) [name], MD as Consulting Physician (Orthopedic Surgery)  Recent office visits: 03/03/22 Desiree Peaches, LPN - Patient presented for [facility] Annual Wellness exam via phone. No medication changes.  10/31/21 [name], [name], MD - Patient presented for Death of family member and other concerns. No medication changes.  Recent consult visits: 05/09/22 Abdul-Mutakallim, Joycelyn Schmid, RN ([facility]) - Patient presented for Diabetes mellitus type 2 with PAD. Shoes and insoles ordered. No medication changes.   05/04/22 Desiree Neas, PA-C (Rheumatology) - Patient presented for Rheumatoid arthritis with rheumatoid factor of multiple sites without organ or systems involvement and other concerns. No medication changes.   05/02/22 Desiree [name], DPM (Podiatry) - Patient presented for Onychomycosis of multiple toenails with type 2 diabetes mellitus and peripheral angiopathy and other concerns. No medication changes.   04/28/22 Hunsucker, Bonna Gains, MD (Pulmonary Disease) - Patient presented for COPD exacerbation and other concerns. Prescribed Doxycycline. Prescribed Prednisone. Changed Albuterol.  01/04/22 Desiree Mimes, MD (hem/onc): Patient presented for protein S deficiency follow up.  01/02/22 Desiree Haskell, MD (cardiology): Patient presented for hypertrophic cardiomyopathy.   Hospital visits: None in previous 6 months  Objective:  Lab Results  Component Value Date   CREATININE 1.88 (H) 05/30/2022   BUN 31 (H) 05/30/2022   GFR 26.83 (L) 05/30/2022   GFRNONAA 35 (L) 12/18/2020   GFRAA 43 02/09/2021   NA 139 05/30/2022   K 4.5 05/30/2022   CALCIUM 9.7 05/30/2022  CO2 29 05/30/2022   GLUCOSE 109 (H) 05/30/2022    Lab Results  Component Value Date/Time   HGBA1C 6.3 05/30/2022 02:39 PM   HGBA1C 6.2 10/07/2021 08:45 AM   GFR 26.83 (L) 05/30/2022 02:39 PM   GFR 28.97 (L) 10/07/2021 08:45 AM   MICROALBUR 1.5 05/30/2022 02:39 PM   MICROALBUR 22.0 (H) 06/22/2021 01:52 PM    Last diabetic Eye exam:  Lab Results  Component Value Date/Time   HMDIABEYEEXA No Retinopathy 09/25/2016 12:00 AM    Last diabetic Foot exam: No results found for: "HMDIABFOOTEX"   Lab Results  Component Value Date   CHOL 167 10/07/2021   HDL 56.60 10/07/2021   LDLCALC 88 10/07/2021   LDLDIRECT 146.3 01/26/2010   TRIG 111.0 10/07/2021   CHOLHDL 3 10/07/2021       Latest Ref Rng & Units 05/04/2022   10:32 AM 10/07/2021    8:45 AM 06/22/2021    1:52 PM  Hepatic Function  Total Protein 6.1 - 8.1 g/dL 7.0  7.8  7.7   Albumin 3.5 - 5.2 g/dL  4.5  4.2   AST 10 - 35 U/L 19  23  28    ALT 6 - 29 U/L 14  15  16    Alk Phosphatase 39 - 117 U/L  64  70   Total Bilirubin 0.2 - 1.2 mg/dL 0.3  0.4  0.5   Bilirubin, Direct 0.0 - 0.3 mg/dL  0.1  0.1     Lab Results  Component Value Date/Time   TSH 1.71 06/22/2021 01:52 PM   TSH 2.153 10/10/2018 01:25 AM   TSH 3.398 08/06/2018 11:36 PM   TSH 1.54 08/07/2017 03:27 PM   FREET4 0.73 08/07/2017 03:27 PM       Latest Ref Rng & Units 05/30/2022    2:39 PM 05/04/2022   10:32 AM 10/07/2021    8:45 AM  CBC  WBC 4.0 - 10.5 K/uL 7.7  13.2  8.9   Hemoglobin 12.0 - 15.0 g/dL 13.2  12.9  13.2   Hematocrit 36.0 - 46.0 % 39.9  38.7  40.8   Platelets 150.0 - 400.0 K/uL 263.0  234  249.0     Lab Results  Component Value Date/Time   VD25OH 32.0 01/20/2019 12:00 AM   VD25OH 53.49 04/01/2015 09:48 AM    Clinical ASCVD: Yes  The 10-year ASCVD risk score (Arnett DK, et al., 2019) is: 19.2%   Values used to calculate the score:     Age: 76 years     Sex: Female     Is Non-Hispanic African American: Yes     Diabetic: Yes     Tobacco  smoker: No     Systolic Blood Pressure: 952 mmHg     Is BP treated: Yes     HDL Cholesterol: 56.6 mg/dL     Total Cholesterol: 167 mg/dL       03/03/2022    2:47 PM 10/31/2021    2:03 PM 03/01/2021    9:57 AM  Depression screen PHQ 2/9  Decreased Interest 0 1 0  Down, Depressed, Hopeless 0 1 0  PHQ - 2 Score 0 2 0  Altered sleeping  2   Tired, decreased energy  2   Change in appetite  1   Feeling bad or failure about yourself   0   Trouble concentrating  1   Moving slowly or fidgety/restless  2   Suicidal thoughts  0   PHQ-9 Score  10  No data to display               No data to display            Social History   Tobacco Use  Smoking Status Former   Packs/day: 2.00   Years: 30.00   Total pack years: 60.00   Types: Cigarettes   Quit date: 10/23/2001   Years since quitting: 20.7   Passive exposure: Past  Smokeless Tobacco Never   BP Readings from Last 3 Encounters:  07/10/22 116/70  07/07/22 111/63  05/04/22 132/76   Pulse Readings from Last 3 Encounters:  07/10/22 82  07/07/22 82  05/04/22 94   Wt Readings from Last 3 Encounters:  07/10/22 169 lb 6.4 oz (76.8 kg)  07/07/22 166 lb (75.3 kg)  05/04/22 170 lb 3.2 oz (77.2 kg)   BMI Readings from Last 3 Encounters:  07/10/22 30.98 kg/m  07/07/22 29.88 kg/m  05/04/22 31.13 kg/m    Assessment/Interventions: Review of patient past medical history, allergies, medications, health status, including review of consultants reports, laboratory and other test data, was performed as part of comprehensive evaluation and provision of chronic care management services.   SDOH:  (Social Determinants of Health) assessments and interventions performed: Yes  SDOH Interventions    Flowsheet Row Chronic Care Management from 07/03/2022 in [place] at [place] from 03/03/2022 in [place] at [place] from 03/01/2021 in [place] at [place] [facility] from 02/11/2020 in [place] at [place] Interventions -- Intervention Not Indicated -- --  Housing Interventions -- Intervention Not Indicated Intervention Not Indicated --  Transportation Interventions -- Intervention Not Indicated Intervention Not Indicated Other (Comment)  [Not needed]  Financial Strain Interventions Other (Comment)  [working on cost savings for medications] Intervention Not Indicated Intervention Not Indicated --  [Not needed]  Physical Activity Interventions -- Intervention Not Indicated Intervention Not Indicated --  Stress Interventions -- Intervention Not Indicated Intervention Not Indicated --  Social Connections Interventions -- Intervention Not Indicated Intervention Not Indicated --      SDOH Screenings   Food Insecurity: No Food Insecurity (03/03/2022)  Housing: Low Risk  (03/03/2022)  Transportation Needs: No Transportation Needs (03/03/2022)  Alcohol Screen: Low Risk  (03/03/2022)  Depression (PHQ2-9): Low Risk  (03/03/2022)  Financial Resource Strain: Medium Risk (07/11/2022)  Physical Activity: Inactive (03/03/2022)  Social Connections: Moderately Integrated (03/03/2022)  Stress: No Stress Concern Present (03/03/2022)  Tobacco Use: Medium Risk (07/10/2022)   CCM Care Plan  Allergies  Allergen Reactions   Prednisone Shortness Of Breath, Itching, Nausea And Vomiting and Palpitations   Solu-Medrol [Methylprednisolone] Anaphylaxis   Amoxicillin Hives and Rash    Has patient had a PCN reaction causing immediate rash, facial/tongue/throat swelling, SOB or lightheadedness with hypotension:Yes Has patient had a PCN reaction causing severe rash involving mucus membranes or skin necrosis: No Has patient had a PCN reaction that required hospitalization: No Has patient had a PCN reaction occurring within the last 10 years: No If all of the above answers are "NO", then may proceed with  Cephalosporin use.    Codeine Hives, Itching and Rash    Tolerated oxycodone and morphine previously   Hydrocodone-Acetaminophen Hives, Itching and Rash    Tolerated oxycodone and morphine previously   Penicillins Hives, Itching and Rash    ALLERGIC REACTION TO ORAL AMOXICILLIN Has patient had a PCN reaction causing immediate rash, facial/tongue/throat swelling,  SOB or lightheadedness with hypotension: Yes Has patient had a PCN reaction causing severe rash involving mucus membranes or skin necrosis: No Has patient had a PCN reaction that required hospitalization: No Has patient had a PCN reaction occurring within the last 10 years: No If all of the above answers are "NO", then may proceed with Cephalosporin use.   Tape Other (See Comments)    sore    Medications Reviewed Today     Reviewed by Gentry Roch, CPhT (Pharmacy Technician) on 07/10/22 at [place] List Status: Complete   Medication Order Taking? Sig Documenting Provider Last Dose Status Informant  Accu-Chek Softclix Lancets lancets [id number]  Use to test blood sugar daily. Dx:e11.9 [name], [name], MD  Active Self    Discontinued 07/10/22 1348 (Patient Preference)   acetaminophen (TYLENOL) 500 MG tablet [id number] Yes Take 1,000 mg by mouth every 6 (six) hours as needed for moderate pain, mild pain or headache. [provider]  Active Self  albuterol (VENTOLIN HFA) 108 (90 Base) MCG/ACT inhaler [id number] Yes Inhale 2 puffs into the lungs every 6 (six) hours as needed for wheezing or shortness of breath.  Patient taking differently: Inhale 2 puffs into the lungs 2 (two) times daily.   Hunsucker, Bonna Gains, MD  Active Self  Alcohol Swabs (DROPSAFE ALCOHOL PREP) 70 % PADS [id number]  USE AS DIRECTED [name], [name], MD  Active Self  amLODipine (NORVASC) 5 MG tablet [id number] Yes TAKE 1 TABLET EVERY MORNING [name], [name], MD  Active Self  aspirin EC 81 MG tablet [id number] Yes Take 81 mg by mouth daily. [provider]  Active Self  azelastine (ASTELIN) 0.1 % nasal spray [id number] Yes USE 2 SPRAYS IN EACH NOSTRIL TWICE DAILY AS DIRECTED Hunsucker, Bonna Gains, MD  Active Self  Blood Glucose Monitoring Suppl (TRUE METRIX AIR GLUCOSE METER) w/Device KIT [id number]  Use as directed DX:11.9 [name], [name], MD  Active Self  Budeson-Glycopyrrol-Formoterol (BREZTRI AEROSPHERE) 160-9-4.8 MCG/ACT AERO [id number] Yes Inhale 2 puffs into the lungs in the morning and at bedtime. Parrett, Fonnie Mu, NP  Active Self  Carboxymethylcellulose Sodium (THERATEARS) 0.25 % SOLN [id number] Yes Place 1 drop into both eyes daily as needed (Dry eyes). [provider]  Active Self  cetirizine (ZYRTEC) 10 MG tablet [id number] Yes Take 10 mg by mouth at bedtime.  [provider]  Active Self  diclofenac sodium (VOLTAREN) 1 % GEL [id number] Yes APPLY 2-4 GRAMS TO AFFECTED JOINTS UP TO FOUR TIMES DAILY  Patient taking differently: Apply 2 g topically daily as needed (pain). APPLY 2-4 GRAMS TO AFFECTED JOINTS   Bo Merino, MD  Active Self  Patient not taking:   Discontinued 07/10/22 1513 famotidine (PEPCID) 40 MG tablet [id number] Yes TAKE 1 TABLET EVERY DAY [name], [name], MD  Active Self  feeding supplement, ENSURE ENLIVE, (ENSURE ENLIVE) LIQD [id number] Yes Take 237 mLs by mouth 3 (three) times daily between meals.  Patient taking differently: Take 237 mLs by mouth 2 (two) times daily between meals. vanilla   [name], MD  Active Self  fluticasone Asencion Islam) 50 MCG/ACT nasal spray [id number] Yes Place 2 sprays into both nostrils at bedtime. Hunsucker, Bonna Gains, MD  Active Self  glucose blood (TRUE METRIX BLOOD GLUCOSE TEST) test strip [id number]  USE AS INSTRUCTED [name], [name], MD  Active Self  hydroxychloroquine (PLAQUENIL) 200 MG tablet [id number] Yes TAKE 1 TABLET EVERY DAY Desiree Neas, PA-C  Active Self   Patient not  taking:   Discontinued 07/10/22 1350 (Patient Preference)  Discontinued 07/10/22  1549 (Discontinued by provider) montelukast (SINGULAIR) 10 MG tablet [id number] Yes TAKE 1 TABLET AT BEDTIME [name], [name], MD  Active Self  Multiple Vitamins-Minerals (CENTRUM SILVER 50+WOMEN) TABS [id number] Yes Take 1 tablet by mouth daily. [provider]  Active Self  neomycin-bacitracin-polymyxin (NEOSPORIN) [phone] ointment [id number] Yes Apply 1 Application topically daily as needed (on fingers). [provider]  Active Self   Discontinued 07/10/22 1518          Med Note Marlana Salvage, [facility]   Mon Jul 10, 2022  3:08 PM)    ondansetron (ZOFRAN-ODT) 4 MG disintegrating tablet [id number] Yes DISSOLVE 1 TABLET ON THE TONGUE EVERY 8 HOURS AS NEEDED FOR NAUSEA OR VOMITING  Patient taking differently: Take 4 mg by mouth 2 (two) times daily.   [name], MD  Active Self  pravastatin (PRAVACHOL) 40 MG tablet [id number] Yes TAKE 1 TABLET EVERY DAY [name], [name], MD  Active Self  Respiratory Therapy Supplies (FLUTTER) DEVI [id number]  1 Device by Does not apply route as directed. Parrett, Fonnie Mu, NP  Active Self  triamcinolone cream (KENALOG) 0.1 % [id number] Yes Apply 1 application  topically daily as needed (for itching of affected areas). [provider]  Active Self  TRUEplus Lancets 33G [place] [id number]  Use as directed DX:11.9 [name], [name], MD  Active Self            Patient Active Problem List   Diagnosis Date Noted   Hypertrophic cardiomyopathy ([place]) 07/10/2022   Unilateral primary osteoarthritis, left knee 07/09/2022   Insomnia secondary to chronic pain 12/27/2020   Insomnia due to other mental disorder 12/27/2020   Snoring 12/27/2020   Inadequate sleep hygiene 12/27/2020   S/P total right hip arthroplasty 12/18/2020   Arthritis of right hip 12/17/2020   Encounter for preoperative assessment 12/10/2020   Infection of nail bed of finger of right hand 09/15/2020   Pain in right finger(s) 08/31/2020   Ganglion of right wrist 08/31/2020   COPD  mixed type ([place]) 12/26/2018   Healthcare maintenance 12/26/2018   History of fall 11/05/2018   Weight loss 11/05/2018   Weakness 11/05/2018   Gangrene of finger ([place]) 10/09/2018   Raynaud's phenomenon with gangrene ([place]) 10/09/2018   LVH (left ventricular hypertrophy) 10/09/2018   Vasospasm ([place]) 09/06/2018   Hypotension 08/08/2018   Leucocytosis 08/08/2018   Elevated troponin I level 08/08/2018   Nausea and vomiting 08/06/2018   Pneumonia 07/12/2018   COPD exacerbation ([place]) 07/11/2018   Primary osteoarthritis of both feet 12/21/2017   DDD (degenerative disc disease), lumbar 12/21/2017   Primary osteoarthritis of both knees 12/21/2017   History of bilateral carpal tunnel release 12/21/2017   DDD (degenerative disc disease), cervical 11/15/2017   Former smoker 11/15/2017   Bronchiectasis without complication ([place]) 21/19/4174   Impingement syndrome of right shoulder 07/25/2017   Hx of fusion of cervical spine 07/25/2017   Cervical spinal stenosis 09/27/2016   Polypharmacy 06/23/2015   Hyperlipidemia 04/19/2015   Visit for preventive health examination 01/01/2015   Primary osteoarthritis involving multiple joints 01/01/2015   Bipolar affective disorder, currently depressed, moderate ([place])    Bipolar I disorder with mania ([place]) 08/17/2014   Hypertension 10/21/2013   Dizziness 03/25/2013   Medication withdrawal ([place]) 03/05/2013   Diabetes mellitus with renal manifestations, controlled ([place]) 11/10/2012   Alopecia areata 02/06/2012   Obesity (BMI 30-39.9) 02/06/2012   Renal insufficiency 07/16/2011   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    Exertional dyspnea 02/07/2011   Dyspnea on exertion 01/19/2011   DM (diabetes mellitus), type 2 ([place]) 04/22/2010   Carpal tunnel syndrome 02/02/2010   CONSTIPATION 02/02/2010   Primary osteoarthritis of both hands 02/02/2010   CATARACTS 04/13/2009   PAIN IN JOINT, ANKLE AND FOOT 09/16/2008   Eosinophilia 07/21/2008   AFFECTIVE DISORDER  04/21/2008   BACK PAIN, CHRONIC 02/12/2008   LEG PAIN 02/12/2008   ABNORMAL INVOLUNTARY MOVEMENTS 12/04/2007   POSTURAL LIGHTHEADEDNESS 11/04/2007   COLONIC POLYPS, HX OF 11/04/2007   Essential hypertension 06/17/2007   HYPERLIPIDEMIA 04/08/2007   GERD 04/08/2007   LOW BACK PAIN SYNDROME 04/08/2007   CHEST PAIN, RECURRENT 04/08/2007    Immunization History  Administered Date(s) Administered   Fluad Quad(high Dose 65+) 07/09/2019, 07/09/2020, 07/11/2021   H1N1 11/13/2008   Influenza Whole 07/21/2008, 07/28/2009, 06/28/2010, 07/05/2011, 09/22/2012   Influenza, High Dose Seasonal PF 07/05/2017, 07/14/2018   Influenza,inj,Quad PF,6+ Mos 07/11/2013, 07/03/2014, 06/23/2015, 06/29/2016   PFIZER Comirnaty(Gray Top)Covid-19 Tri-Sucrose Vaccine 02/02/2021   PFIZER(Purple Top)SARS-COV-2 Vaccination 11/17/2019, 12/08/2019, 08/04/2020   Pneumococcal Conjugate-13 12/19/2013   Pneumococcal Polysaccharide-23 02/25/2009, 04/19/2015   Td 02/02/2010   Tdap 04/25/2016   Patient reports she still hasn't figured out her glucometer and will bring this and her BP cuff to her visit with Dr. [name].    Patient is still having issues with cost of medications and discussed the option for cost plus [employer] for discounts. Patient requested azelastine nasal spray and Proventil generic to be sent there for cost savings.  Patient is learning how to use her new phone. Patient has bruises on her legs.  Patient reports her knee is swelling and has an appt with ortho on the 15th. Patient reports ortho told her it was arthritis and left one needs to be replaced. The left one is swelling and its just the knee area.  Patient is taking the Zofran twice a day every day as prevention for nausea. Cautioned patient against doing this. Patient will discuss with GI provider who prescribed. Patient did feel lightheaded the other day.  Conditions to be addressed/monitored:  Hypertension, Hyperlipidemia, Diabetes,  GERD, COPD, Depression, Osteoarthritis and Raynaud's  Conditions addressed this visit: Hypertension, diabetes  Care Plan : CCM Pharmacy Care Plan  Updates made by Viona Gilmore, [place] since 07/11/2022 12:00 AM     Problem: Problem: Hypertension, Hyperlipidemia, Diabetes, GERD, COPD, Depression, Osteoarthritis and Raynaud's      Long-Range Goal: Patient-Specific Goal   Start Date: 03/30/2021  Expected End Date: 03/30/2022  Recent Progress: On track  Priority: High  Note:   Current Barriers:  Unable to independently afford treatment regimen Unable to independently monitor therapeutic efficacy Suboptimal therapeutic regimen for prevention of heart events  Pharmacist Clinical Goal(s):  Patient will verbalize ability to afford treatment regimen achieve adherence to monitoring guidelines and medication adherence to achieve therapeutic efficacy achieve control of cholesterol as evidenced by next lipid panel  through collaboration with PharmD and provider.   Interventions: 1:1 collaboration with [name], [name], MD regarding development and update of comprehensive plan of care as evidenced by provider attestation and co-signature Inter-disciplinary care team collaboration (see longitudinal plan of care) Comprehensive medication review performed; medication list updated in electronic medical record  Hypertension (BP goal <130/80) -Controlled -Current treatment: Amlodipine 5 mg 1 tablet daily - Appropriate, Effective, Safe, Accessible Losartan 25 mg 1 tablet daily - Appropriate, Effective, Safe, Accessible -Medications previously tried: furosemide  -Current home readings:  not checking at home -Current dietary habits: did not discuss -Current exercise habits: no regular exercise -Reports hypotensive/hypertensive symptoms -Educated on Exercise goal of 150 minutes per week; Importance of home blood pressure monitoring; Proper BP monitoring technique; Symptoms of hypotension and  importance of maintaining adequate hydration; -Counseled to monitor BP at home weekly, document, and provide log at future appointments -Counseled on diet and exercise extensively Recommended to continue current medication Recommended purchasing an arm BP cuff to be able to monitor at home  Hyperlipidemia: (LDL goal < 55) -Uncontrolled -Current treatment: Pravastatin 40 mg 1 tablet daily - Appropriate, Query effective, Safe, Accessible -Medications previously tried: atorvastatin (on hold due to possible myalgias due to med), rosuvastatin (cost)  -Current dietary patterns: did not discuss -Current exercise habits: no regular exercise -Educated on Cholesterol goals;  Benefits of statin for ASCVD risk reduction; Exercise goal of 150 minutes per week; -Counseled on diet and exercise extensively Recommended repeat lipid panel and increasing statin therapy depending on result.  Raynaud's phenomenon (Goal: minimize symptoms) -Controlled -Current treatment  Amlodipine 29m 1 tablet daily - Appropriate, Effective, Safe, Accessible  Plaquenil 200 mg daily - Appropriate, Effective, Safe, Accessible -Medications previously tried: none  -Recommended to continue current medication   Diabetes (A1c goal <7%) -Controlled -Current medications: No medications -Medications previously tried: Actos  -Current home glucose readings fasting glucose: does not check post prandial glucose: does not check -Denies hypoglycemic/hyperglycemic symptoms -Current meal patterns:  breakfast: did not discuss  lunch: did not discuss   dinner: did not discuss  snacks: did not discuss  drinks: did not discuss  -Current exercise: no regular exercise -Educated on Counseled to check feet daily and get yearly eye exams -Counseled to check feet daily and get yearly eye exams -Counseled on diet and exercise extensively Recommended bringing glucometer to Dr. PRegis Billappointment.  COPD (Goal: control symptoms and  prevent exacerbations) -Controlled -Current treatment  Breztri 160-9-4.8 mcg/act 2 puffs into the lungs twice daily - Appropriate, Effective, Safe, Accessible Albuterol nebulizer as needed - Appropriate, Effective, Safe, Accessible -Medications previously tried: n/a  -Gold Grade: Gold 2 (FEV1 50-79%) -Current COPD Classification:  B (high sx, <2 exacerbations/yr) -MMRC/CAT score: n/a -Pulmonary function testing: 2019 -Exacerbations requiring treatment in last 6 months: none -Patient reports consistent use of maintenance inhaler -Frequency of rescue inhaler use: not often -Counseled on Differences between maintenance and rescue inhalers -Recommended to continue current medication Assessed patient finances. Provided contact info for [employer].  Depression/Bipolar depression (Goal: minimize symptoms) -Not ideally controlled -Current treatment: No medications -Medications previously tried/failed: Quetiapine, risperidone, lorazepam, temazepam, Lamotrigine, Duloxetine -PHQ9: 0 -Educated on Benefits of medication for symptom control -Recommended to follow up with behavioral health  GERD (Goal: minimize symptoms of heartburn/acid reflux) -Controlled -Current treatment  Famotidine 414m1 tablet daily - Appropriate, Effective, Safe, Accessible -Medications previously tried: Dexilant, Nexium, Prilosec, Protonix, Carafate  -Recommended to continue current medication  Constipation (Goal: regular bowel movements) -Uncontrolled -Current treatment  Linzess 14571m 1 capsule once daily as needed for constipation - Appropriate, Effective, Safe, Query accessible -Medications previously tried: none  -Assessed patient finances. Patient would qualify for PAP but unsure of status. Will plan for CMA to follow up.  Osteoarthritis (Goal: minimize pain) -Controlled -Current treatment  Acetaminophen 500m70m tablets every 6 hours as needed - Appropriate, Effective, Safe, Accessible Diclofenac 1% gel, 2-4  grams to affected joints up to 4 times daily (uses 3x/ day) - Appropriate, Effective, Safe, Accessible -Medications previously tried: n/a  -Recommended to continue  current medication  Allergic rhinitis (Goal: minimize symptoms of allergies) -Controlled -Current treatment  Montelukast 78m 1 tablet at bedtime - Appropriate, Effective, Safe, Accessible Cetirizine 127m 1 tablet at bedtime - Appropriate, Effective, Safe, Accessible Fluticasone nasal spray, 1 to 2 sprays into both nostrils at bedtime  - Appropriate, Effective, Safe, Accessible -Medications previously tried: n/a  -Recommended to continue current medication   Health Maintenance -Vaccine gaps: shingrix -Current therapy:  Multivitamin (Centrum Silver), 1 tablet once daily Triamcinolone 0.9.5%ream, 1 application as needed for itching of affected areas Refresh repair eye drops- 3x/day Refresh Optive gel Ensure- 2-3x/ day  Ondasetron 67m47mDT,1 tablet every 8 hours as needed -Educated on Cost vs benefit of each product must be carefully weighed by individual consumer -Patient is satisfied with current therapy and denies issues -Recommended to continue current medication  Patient Goals/Self-Care Activities Patient will:  - take medications as prescribed check blood pressure weekly, document, and provide at future appointments target a minimum of 150 minutes of moderate intensity exercise weekly  Follow Up Plan: The care management team will reach out to the patient again over the next 14 days.         Medication Assistance: Application for [employer]  medication assistance program. in process.  Anticipated assistance start date 05/21/21.  See plan of care for additional detail.  Compliance/Adherence/Medication fill history: Care Gaps: Shingrix, eye exam, DEXA, foot exam, pneumovax, mammogram, COVID booster, influenza BP- 132/76 (05/04/22) A1c - 6.3% (05/30/22)  Star-Rating Drugs: Losartan 25 mg - [facility]2122 [facility]  [address] [place]one: [phone]: [id number] [address] [place] [place]one: [phone]: [address] [place]one: [phone]: [phone] pill box? No - has own system Pt endorses 80% compliance  We discussed: Benefits of medication synchronization, packaging and delivery as well as enhanced pharmacist oversight with [employer]. Patient decided to: Continue current medication management strategy  Care Plan and Follow Up Patient Decision:  Patient agrees to Care Plan and Follow-up.  Plan: Follow up in 4 months.  MadJeni SallesharmD, BCAOuachitaarmacist [place]

## 2022-07-04 ENCOUNTER — Telehealth: Payer: Medicare HMO

## 2022-07-04 ENCOUNTER — Telehealth: Payer: Self-pay | Admitting: Internal Medicine

## 2022-07-04 NOTE — Telephone Encounter (Signed)
Odor coming thru the vents in her apartment, nauseates her

## 2022-07-07 ENCOUNTER — Ambulatory Visit (INDEPENDENT_AMBULATORY_CARE_PROVIDER_SITE_OTHER): Payer: Medicare HMO

## 2022-07-07 ENCOUNTER — Telehealth: Payer: Self-pay | Admitting: Radiology

## 2022-07-07 ENCOUNTER — Encounter: Payer: Self-pay | Admitting: Orthopaedic Surgery

## 2022-07-07 ENCOUNTER — Ambulatory Visit: Payer: Medicare HMO | Admitting: Orthopaedic Surgery

## 2022-07-07 ENCOUNTER — Ambulatory Visit: Payer: Self-pay

## 2022-07-07 VITALS — BP 111/63 | HR 82 | Ht 62.5 in | Wt 166.0 lb

## 2022-07-07 DIAGNOSIS — M1712 Unilateral primary osteoarthritis, left knee: Secondary | ICD-10-CM | POA: Diagnosis not present

## 2022-07-07 DIAGNOSIS — M79671 Pain in right foot: Secondary | ICD-10-CM

## 2022-07-07 DIAGNOSIS — M25561 Pain in right knee: Secondary | ICD-10-CM | POA: Diagnosis not present

## 2022-07-07 DIAGNOSIS — G8929 Other chronic pain: Secondary | ICD-10-CM

## 2022-07-07 DIAGNOSIS — M25562 Pain in left knee: Secondary | ICD-10-CM

## 2022-07-07 NOTE — Telephone Encounter (Signed)
Per Dr. Ophelia Charter, patient to stop Aspirin 81mg  5 days prior to knee surgery. I called  patient and advised.  Desiree [name]--FYI

## 2022-07-09 DIAGNOSIS — M1712 Unilateral primary osteoarthritis, left knee: Secondary | ICD-10-CM | POA: Insufficient documentation

## 2022-07-09 NOTE — Progress Notes (Signed)
Office Visit Note   Patient: Desiree [name]           Date of Birth: 10-11-1951           MRN: [id number] Visit Date: 07/07/2022              Requested by: [name], MD [place],  [place] [address] PCP: [name], MD   Assessment & Plan: Visit Diagnoses:  1. Chronic pain of both knees   2. Pain in right foot   3. Unilateral primary osteoarthritis, left knee     Plan: Patient states she like proceed with left total knee arthroplasty.  We discussed overnight stay home physical therapy followed by outpatient therapy.  Questions elicited and answered.  She has had elevated creatinine and has had to avoid anti-inflammatories.  Patient understands and would like to proceed.  Follow-Up Instructions: No follow-ups on file.   Orders:  Orders Placed This Encounter  Procedures   XR KNEE 3 VIEW LEFT   XR KNEE 3 VIEW RIGHT   XR Foot Complete Right   No orders of the defined types were placed in this encounter.     Procedures: No procedures performed   Clinical Data: No additional findings.   Subjective: Chief Complaint  Patient presents with   Right Foot - Pain   Right Knee - Pain   Left Knee - Pain    HPI 71 year old female returns with persistent problems with left knee pain.  Previous injection 03/02/21 left knee with temporary pain.  Pain is gotten worse with catching she states she has had to grab her cell to avoid falling.  She has also had problems with the second hammertoe and has a callus that she has to continue to trim to avoid pain.  Patient had previous total of arthroplasty.  She has not additional diagnosis of rheumatoid arthritis and states she is ready proceed with left total knee arthroplasty.  Additional diagnosis with bipolar hyperlipidemia.  Previous cervical fusion.  Raynaud's, rheumatoid arthritis.  Review of Systems 14 point systems noncontributory to HPI.   Objective: Vital Signs: BP 111/63   Pulse 82   Ht 5' 2.5"  (1.588 m)   Wt 166 lb (75.3 kg)   BMI 29.88 kg/m   Physical Exam Constitutional:      Appearance: She is well-developed.  HENT:     Head: Normocephalic.     Right Ear: External ear normal.     Left Ear: External ear normal. There is no impacted cerumen.  Eyes:     Pupils: Pupils are equal, round, and reactive to light.  Neck:     Thyroid: No thyromegaly.     Trachea: No tracheal deviation.  Cardiovascular:     Rate and Rhythm: Normal rate.  Pulmonary:     Effort: Pulmonary effort is normal.  Abdominal:     Palpations: Abdomen is soft.  Musculoskeletal:     Cervical back: No rigidity.  Skin:    General: Skin is warm and dry.  Neurological:     Mental Status: She is alert and oriented to person, place, and time.  Psychiatric:        Behavior: Behavior normal.     Ortho Exam patient has left knee tenderness crepitus knee range of motion collateral ligaments are stable.  Reflexes are 2+ and symmetrical.  Anterior tib gastrocsoleus is intact.  Well-healed total of arthroplasty incision.  2+ knee effusion.  Specialty Comments:  No specialty  comments available.  Imaging: Standing AP both knees lateral left knee patellofemoral x-rays obtained  and reviewed.  This shows moderate to severe left knee osteoarthritis with  loss of joint space marginal osteophytes flattening of the femoral condyle  and subchondral sclerosis.   Assessment left knee osteoarthritis tricompartmental.   PMFS History: Patient Active Problem List   Diagnosis Date Noted   Unilateral primary osteoarthritis, left knee 07/09/2022   Insomnia secondary to chronic pain 12/27/2020   Insomnia due to other mental disorder 12/27/2020   Snoring 12/27/2020   Inadequate sleep hygiene 12/27/2020   S/P total right hip arthroplasty 12/18/2020   Arthritis of right hip 12/17/2020   Encounter for preoperative assessment 12/10/2020   Infection of nail bed of finger of right hand 09/15/2020   Pain in right finger(s)  08/31/2020   Ganglion of right wrist 08/31/2020   COPD mixed type (HCC) 12/26/2018   Healthcare maintenance 12/26/2018   History of fall 11/05/2018   Weight loss 11/05/2018   Weakness 11/05/2018   Gangrene of finger (HCC) 10/09/2018   Raynaud's phenomenon with gangrene (HCC) 10/09/2018   LVH (left ventricular hypertrophy) 10/09/2018   Vasospasm (HCC) 09/06/2018   Hypotension 08/08/2018   Leucocytosis 08/08/2018   Elevated troponin I level 08/08/2018   Nausea and vomiting 08/06/2018   Pneumonia 07/12/2018   COPD exacerbation (HCC) 07/11/2018   Primary osteoarthritis of both feet 12/21/2017   DDD (degenerative disc disease), lumbar 12/21/2017   Primary osteoarthritis of both knees 12/21/2017   History of bilateral carpal tunnel release 12/21/2017   DDD (degenerative disc disease), cervical 11/15/2017   Former smoker 11/15/2017   Bronchiectasis without complication (HCC) 10/25/2017   Impingement syndrome of right shoulder 07/25/2017   Hx of fusion of cervical spine 07/25/2017   Cervical spinal stenosis 09/27/2016   Polypharmacy 06/23/2015   Hyperlipidemia 04/19/2015   Visit for preventive health examination 01/01/2015   Primary osteoarthritis involving multiple joints 01/01/2015   Bipolar affective disorder, currently depressed, moderate (HCC)    Bipolar I disorder with mania (HCC) 08/17/2014   Hypertension 10/21/2013   Dizziness 03/25/2013   Medication withdrawal (HCC) 03/05/2013   Diabetes mellitus with renal manifestations, controlled (HCC) 11/10/2012   Alopecia areata 02/06/2012   Obesity (BMI 30-39.9) 02/06/2012   Renal insufficiency 07/16/2011   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    Exertional dyspnea 02/07/2011   Dyspnea on exertion 01/19/2011   DM (diabetes mellitus), type 2 (HCC) 04/22/2010   Carpal tunnel syndrome 02/02/2010   CONSTIPATION 02/02/2010   Primary osteoarthritis of both hands 02/02/2010   CATARACTS 04/13/2009   PAIN IN JOINT, ANKLE AND FOOT 09/16/2008    Eosinophilia 07/21/2008   AFFECTIVE DISORDER 04/21/2008   BACK PAIN, CHRONIC 02/12/2008   LEG PAIN 02/12/2008   ABNORMAL INVOLUNTARY MOVEMENTS 12/04/2007   POSTURAL LIGHTHEADEDNESS 11/04/2007   COLONIC POLYPS, HX OF 11/04/2007   Essential hypertension 06/17/2007   HYPERLIPIDEMIA 04/08/2007   GERD 04/08/2007   LOW BACK PAIN SYNDROME 04/08/2007   CHEST PAIN, RECURRENT 04/08/2007   Past Medical History:  Diagnosis Date   Acute gastritis without bleeding 08/08/2018   Anemia    Anxiety    Asthma    Bipolar disorder (HCC)    CANDIDIASIS, ESOPHAGEAL 07/28/2009   Qualifier: Diagnosis of  By: Fabian Sharp MD, Neta Mends    Chronic kidney disease    CKD III   Complication of anesthesia    was told she stopped breathing for one of her finger surgeries   COPD (chronic obstructive pulmonary  disease) (HCC)    Depression    Diabetes mellitus without complication (HCC)    no meds   Dyspnea    At rest,and with activity   FH: colonic polyps    Fractured elbow    right    GERD (gastroesophageal reflux disease)    Headache    migraines   HH (hiatus hernia)    History of carpal tunnel syndrome    History of chest pain    History of transfusion of packed red blood cells    Hyperlipidemia    Hypertension    Mitral valve prolapse    Neuroleptic-induced tardive dyskinesia    Osteoarthritis of more than one site    Pneumonia    Seasonal allergies    Stroke [facility])     Family History  Problem Relation Age of Onset   Diabetes Mother    Hypertension Mother    Heart attack Mother    Heart attack Father    Heart disease Father    Throat cancer Brother    Diabetes type II Brother    Heart disease Brother    Lung cancer Brother    Lung cancer Paternal Uncle    Lung cancer Daughter    Breast cancer Cousin     Past Surgical History:  Procedure Laterality Date   AMPUTATION Left 10/14/2018   Procedure: AMPUTATION LEFT LONG FINGER TIP;  Surgeon: Knute Neu, MD;  Location: [facility] OR;  Service:  Plastics;  Laterality: Left;   ANTERIOR CERVICAL DECOMP/DISCECTOMY FUSION  09/27/2016   C5-6 anterior cervical discectomy and fusion, allograft and plate/notes 16/10/958   ANTERIOR CERVICAL DECOMP/DISCECTOMY FUSION N/A 09/27/2016   Procedure: C5-6 Anterior Cervical Discectomy and Fusion, Allograft and Plate;  Surgeon: Eldred Manges, MD;  Location: [facility] OR;  Service: Orthopedics;  Laterality: N/A;   Back Fusion  2002   BIOPSY  07/19/2018   Procedure: BIOPSY;  Surgeon: Jeani Hawking, MD;  [facility];  Service: Endoscopy;;   BIOPSY  02/13/2019   Procedure: BIOPSY;  Surgeon: Jeani Hawking, MD;  Location: [facility] ENDOSCOPY;  Service: Endoscopy;;   CARPAL TUNNEL RELEASE  Tobie Hellen   left   COLONOSCOPY N/A 01/05/2014   Procedure: COLONOSCOPY;  Surgeon: Charna Elizabeth, MD;  Location: [facility] ENDOSCOPY;  Service: Endoscopy;  Laterality: N/A;   ELBOW SURGERY     age 79   ENTEROSCOPY N/A 02/13/2019   Procedure: ENTEROSCOPY;  Surgeon: Jeani Hawking, MD;  Location: [facility] ENDOSCOPY;  Service: Endoscopy;  Laterality: N/A;   ESOPHAGOGASTRODUODENOSCOPY (EGD) WITH PROPOFOL N/A 07/19/2018   Procedure: ESOPHAGOGASTRODUODENOSCOPY (EGD) WITH PROPOFOL;  Surgeon: Jeani Hawking, MD;  Location[facility]COPY;  Service: Endoscopy;  Laterality: N/A;   EXTERNAL EAR SURGERY Left    EYE SURGERY     "removed white dots under eyelid"   FINGER SURGERY Left    Juvara osteomy     KNEE SURGERY     NOSE SURGERY     Rt. toe bunion     skin, shave biopsy  05/03/2016   Left occipital scalp, top of scalp   TONSILLECTOMY     TOTAL HIP ARTHROPLASTY Right 12/17/2020   TOTAL HIP ARTHROPLASTY Right 12/17/2020   Procedure: TOTAL HIP ARTHROPLASTY-DIRECT ANTERIOR;  Surgeon: Eldred Manges, MD;  Location: [facility] OR;  Service: Orthopedics;  Laterality: Right;  needs RNFA   UPPER EXTREMITY ANGIOGRAPHY Bilateral 10/11/2018   Procedure: UPPER EXTREMITY ANGIOGRAPHY;  Surgeon: Sherren Kerns, MD;  Location: [facility] INVASIVE CV LAB;  Service: Cardiovascular;   Laterality: Bilateral;  Social History   Occupational History   Not on file  Tobacco Use   Smoking status: Former    Packs/day: 2.00    Years: 30.00    Total pack years: 60.00    Types: Cigarettes    Quit date: 10/23/2001    Years since quitting: 20.7    Passive exposure: Past   Smokeless tobacco: Never  Vaping Use   Vaping Use: Never used  Substance and Sexual Activity   Alcohol use: No   Drug use: No   Sexual activity: Not on file

## 2022-07-09 NOTE — Progress Notes (Unsigned)
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 is abnormal or we need to change your treatment, we will call you to review the results.   Testing/Procedures: NONE   Follow-Up: At [facility], you and your health needs are our priority.  As part of our continuing mission to provide you with exceptional heart care, we have created designated Provider Care Teams.  These Care Teams include your primary Cardiologist (physician) and Advanced Practice Providers (APPs -  Physician Assistants and Nurse Practitioners) who all work together to provide you with the care you need, when you need it.  We recommend signing up for the patient portal  called "MyChart".  Sign up information is provided on this After Visit Summary.  MyChart is used to connect with patients for Virtual Visits (Telemedicine).  Patients are able to view lab/test results, encounter notes, upcoming appointments, etc.  Non-urgent messages can be sent to your provider as well.   To learn more about what you can do with MyChart, go to [email or web address].    Your next appointment:   6 month(s)  The format for your next appointment:   In Person  Provider:   Rudean Haskell, MD    Other Instructions   Important Information About Sugar         Signed, Werner Lean, MD  07/10/2022 3:55 PM    [place]

## 2022-07-10 ENCOUNTER — Encounter: Payer: Self-pay | Admitting: Internal Medicine

## 2022-07-10 ENCOUNTER — Other Ambulatory Visit: Payer: Self-pay

## 2022-07-10 ENCOUNTER — Telehealth: Payer: Self-pay | Admitting: *Deleted

## 2022-07-10 ENCOUNTER — Ambulatory Visit: Payer: Medicare HMO | Attending: Internal Medicine | Admitting: Internal Medicine

## 2022-07-10 VITALS — BP 116/70 | HR 82 | Ht 62.0 in | Wt 169.4 lb

## 2022-07-10 DIAGNOSIS — I10 Essential (primary) hypertension: Secondary | ICD-10-CM

## 2022-07-10 DIAGNOSIS — J449 Chronic obstructive pulmonary disease, unspecified: Secondary | ICD-10-CM

## 2022-07-10 DIAGNOSIS — I422 Other hypertrophic cardiomyopathy: Secondary | ICD-10-CM | POA: Diagnosis not present

## 2022-07-10 NOTE — Telephone Encounter (Signed)
Message left by patient over the weekend asking about her X-rays and if she could get a copy. Call back from [facility] to discuss her upcoming Left total knee arthroplasty with Dr. Lorin Mercy on 07/21/22. She does have an H&P appointment here in office this week. RNCM will see her then to review Ortho bundle and post op information. She is agreeable.

## 2022-07-10 NOTE — Patient Instructions (Addendum)
Medication Instructions:  Your physician has recommended you make the following change in your medication:  STOP: losartan  *If you need a refill on your cardiac medications before your next appointment, please call your pharmacy*   Lab Work: [place] If you have labs (blood work) drawn today and your tests are completely normal, you will receive your results only by: [place] (if you have MyChart) OR A paper copy in the mail If you have any lab test that is abnormal or we need to change your treatment, we will call you to review the results.   Testing/Procedures: NONE   Follow-Up: At [facility], you and your health needs are our priority.  As part of our continuing mission to provide you with exceptional heart care, we have created designated Provider Care Teams.  These Care Teams include your primary Cardiologist (physician) and Advanced Practice Providers (APPs -  Physician Assistants and Nurse Practitioners) who all work together to provide you with the care you need, when you need it.  We recommend signing up for the patient portal called "MyChart".  Sign up information is provided on this After Visit Summary.  MyChart is used to connect with patients for Virtual Visits (Telemedicine).  Patients are able to view lab/test results, encounter notes, upcoming appointments, etc.  Non-urgent messages can be sent to your provider as well.   To learn more about what you can do with MyChart, go to [email or web address].    Your next appointment:   6 month(s)  The format for your next appointment:   In Person  Provider:   Rudean Haskell, MD    Other Instructions   Important Information About Sugar

## 2022-07-11 ENCOUNTER — Telehealth: Payer: Medicare HMO | Admitting: Internal Medicine

## 2022-07-12 ENCOUNTER — Other Ambulatory Visit: Payer: Self-pay

## 2022-07-12 ENCOUNTER — Telehealth: Payer: Self-pay | Admitting: *Deleted

## 2022-07-12 ENCOUNTER — Telehealth: Payer: Self-pay | Admitting: Pulmonary Disease

## 2022-07-12 ENCOUNTER — Encounter: Payer: Self-pay | Admitting: Surgery

## 2022-07-12 ENCOUNTER — Ambulatory Visit: Payer: Medicare HMO | Admitting: Surgery

## 2022-07-12 VITALS — BP 128/68 | HR 106 | Ht 62.0 in | Wt 169.0 lb

## 2022-07-12 DIAGNOSIS — M1712 Unilateral primary osteoarthritis, left knee: Secondary | ICD-10-CM

## 2022-07-12 DIAGNOSIS — M1711 Unilateral primary osteoarthritis, right knee: Secondary | ICD-10-CM

## 2022-07-12 MED ORDER — ALBUTEROL SULFATE HFA 108 (90 BASE) MCG/ACT IN AERS: 2.0000 | INHALATION_SPRAY | Freq: Four times a day (QID) | RESPIRATORY_TRACT | 2 refills | Status: DC | PRN

## 2022-07-12 MED ORDER — AZELASTINE HCL 0.1 % NA SOLN: NASAL | 11 refills | Status: DC

## 2022-07-12 NOTE — Progress Notes (Signed)
Surgical Instructions    Your procedure is scheduled on Friday September 29th.  Report to [facility] Main Entrance "A" at 5:30 A.M., then check in with the Admitting office.  Call this number if you have problems the morning of surgery:  [phone]   If you have any questions prior to your surgery date call [phone]: Open Monday-Friday 8am-4pm    Remember:  Do not eat after midnight the night before your surgery  You may drink clear liquids until 4:30am the morning of your surgery.   Clear liquids allowed are: Water, Non-Citrus Juices (without pulp), Carbonated Beverages, Clear Tea, Black Coffee ONLY (NO MILK, CREAM OR POWDERED CREAMER of any kind), and Gatorade   Enhanced Recovery after Surgery for Orthopedics Enhanced Recovery after Surgery is a protocol used to improve the stress on your body and your recovery after surgery.  Patient Instructions   The day of surgery (if you have diabetes):  Drink ONE small 10 oz bottle of water or G2 by ___4:30__ am the morning of surgery This bottle was given to you during your hospital  pre-op appointment visit.  Nothing else to drink after completing the  Small 10 oz bottle of water.         If you have questions, please contact your surgeon's office.     Take these medicines the morning of surgery with A SIP OF WATER: amLODipine (NORVASC) 5 MG tablet azelastine (ASTELIN) 0.1 % nasal spray Budeson-Glycopyrrol-Formoterol (BREZTRI AEROSPHERE) 160-9-4.8 MCG/ACT AERO famotidine (PEPCID) 40 MG tablet pravastatin (PRAVACHOL) 40 MG tablet   IF NEEDED acetaminophen (TYLENOL) 500 MG tablet albuterol (VENTOLIN HFA) 108 (90 Base) MCG/ACT inhaler  Carboxymethylcellulose Sodium (THERATEARS) 0.25 % SOLN ondansetron (ZOFRAN-ODT) 4 MG disintegrating tablet   Please bring inhalers with you to the hospital   PLEASE STOP your hydroxychloroquine (PLAQUENIL)  three (3) days prior to surgery  Follow your surgeon's instructions on when to  stop Aspirin.  If no instructions were given by your surgeon then you will need to call the office to get those instructions.    As of today, STOP taking any Aspirin (unless otherwise instructed by your surgeon) Aleve, Naproxen, Ibuprofen, Motrin, Advil, Goody's, BC's, all herbal medications, fish oil, and all vitamins.   WHAT DO I DO ABOUT MY DIABETES MEDICATION?   Do not take oral diabetes medicines (pills) the morning of surgery.   The day of surgery, do not take other diabetes injectables, including Byetta (exenatide), Bydureon (exenatide ER), Victoza (liraglutide), or Trulicity (dulaglutide).  If your CBG is greater than 220 mg/dL, you may take  of your sliding scale (correction) dose of insulin.   HOW TO MANAGE YOUR DIABETES BEFORE AND AFTER SURGERY  Why is it important to control my blood sugar before and after surgery? Improving blood sugar levels before and after surgery helps healing and can limit problems. A way of improving blood sugar control is eating a healthy diet by:  Eating less sugar and carbohydrates  Increasing activity/exercise  Talking with your doctor about reaching your blood sugar goals High blood sugars (greater than 180 mg/dL) can raise your risk of infections and slow your recovery, so you will need to focus on controlling your diabetes during the weeks before surgery. Make sure that the doctor who takes care of your diabetes knows about your planned surgery including the date and location.  How do I manage my blood sugar before surgery? Check your blood sugar at least 4 times a day, starting 2 days before surgery,  to make sure that the level is not too high or low.  Check your blood sugar the morning of your surgery when you wake up and every 2 hours until you get to the Short Stay unit.  If your blood sugar is less than 70 mg/dL, you will need to treat for low blood sugar: Do not take insulin. Treat a low blood sugar (less than 70 mg/dL) with  cup of  clear juice (cranberry or apple), 4 glucose tablets, OR glucose gel. Recheck blood sugar in 15 minutes after treatment (to make sure it is greater than 70 mg/dL). If your blood sugar is not greater than 70 mg/dL on recheck, call [phone] for further instructions. Report your blood sugar to the short stay nurse when you get to Short Stay.  If you are admitted to the hospital after surgery: Your blood sugar will be checked by the staff and you will probably be given insulin after surgery (instead of oral diabetes medicines) to make sure you have good blood sugar levels. The goal for blood sugar control after surgery is 80-180 mg/dL.   PREPARING FOR SURGERY  Do not wear jewelry or makeup. Do not wear lotions, powders, perfumes or deodorant. Do not shave 48 hours prior to surgery.   Do not bring valuables to the hospital. Do not wear nail polish, gel polish, artificial nails, or any other type of covering on natural nails (fingers and toes) If you have artificial nails or gel coating that need to be removed by a nail salon, please have this removed prior to surgery. Artificial nails or gel coating may interfere with anesthesia's ability to adequately monitor your vital signs.  [facility] is not responsible for any belongings or valuables.    Do NOT Smoke (Tobacco/Vaping)  24 hours prior to your procedure  If you use a CPAP at night, you may bring your mask for your overnight stay.   Contacts, glasses, hearing aids, dentures or partials may not be worn into surgery, please bring cases for these belongings   For patients admitted to the hospital, discharge time will be determined by your treatment team.   Patients discharged the day of surgery will not be allowed to drive home, and someone needs to stay with them for 24 hours.   SURGICAL WAITING ROOM VISITATION Patients having surgery or a procedure may have no more than 2 support people in the waiting area - these visitors may rotate.    Children under the age of 52 must have an adult with them who is not the patient. If the patient needs to stay at the hospital during part of their recovery, the visitor guidelines for inpatient rooms apply. Pre-op nurse will coordinate an appropriate time for 1 support person to accompany patient in pre-op.  This support person may not rotate.   Please refer to the [facility] website for the visitor guidelines for Inpatients (after your surgery is over and you are in a regular room).    Special instructions:    Oral Hygiene is also important to reduce your risk of infection.  Remember - BRUSH YOUR TEETH THE MORNING OF SURGERY WITH YOUR REGULAR TOOTHPASTE   [facility]- Preparing For Surgery  Before surgery, you can play an important role. Because skin is not sterile, your skin needs to be as free of germs as possible. You can reduce the number of germs on your skin by washing with CHG (chlorahexidine gluconate) Soap before surgery.  CHG is an antiseptic cleaner  which kills germs and bonds with the skin to continue killing germs even after washing.     Please do not use if you have an allergy to CHG or antibacterial soaps. If your skin becomes reddened/irritated stop using the CHG.  Do not shave (including legs and underarms) for at least 48 hours prior to first CHG shower. It is OK to shave your face.  Please follow these instructions carefully.     Shower the NIGHT BEFORE SURGERY and the MORNING OF SURGERY with CHG Soap.   If you chose to wash your hair, wash your hair first as usual with your normal shampoo. After you shampoo, rinse your hair and body thoroughly to remove the shampoo.  Then [employer] and genitals (private parts) with your normal soap and rinse thoroughly to remove soap.  After that Use CHG Soap as you would any other liquid soap. You can apply CHG directly to the skin and wash gently with a scrungie or a clean washcloth.   Apply the CHG Soap to your body ONLY FROM  THE NECK DOWN.  Do not use on open wounds or open sores. Avoid contact with your eyes, ears, mouth and genitals (private parts). Wash Face and genitals (private parts)  with your normal soap.   Wash thoroughly, paying special attention to the area where your surgery will be performed.  Thoroughly rinse your body with warm water from the neck down.  DO NOT shower/wash with your normal soap after using and rinsing off the CHG Soap.  Pat yourself dry with a CLEAN TOWEL.  Wear CLEAN PAJAMAS to bed the night before surgery  Place CLEAN SHEETS on your bed the night before your surgery  DO NOT SLEEP WITH PETS.   Day of Surgery:  Take a shower with CHG soap. Wear Clean/Comfortable clothing the morning of surgery Do not apply any deodorants/lotions.   Remember to brush your teeth WITH YOUR REGULAR TOOTHPASTE.    If you received a COVID test during your pre-op visit, it is requested that you wear a mask when out in public, stay away from anyone that may not be feeling well, and notify your surgeon if you develop symptoms. If you have been in contact with anyone that has tested positive in the last 10 days, please notify your surgeon.    Please read over the following fact sheets that you were given.

## 2022-07-12 NOTE — Progress Notes (Signed)
71 year old white female history of end-stage DJD left knee and pain comes in for prep evaluation.  States that the symptoms unchanged in previous visit and she is want to proceed with left total knee replacement as scheduled.  Today history physical performed.  Review of systems positive for exertional angina and exertional dyspnea.  Patient states that she has been using her albuterol inhaler more frequently.  She admits to having wheezes.  Last seen by pulmonologist April 28, 2022 for COPD exacerbation.  Last seen by her cardiologist July 10, 2022.  Physician did not request preop medical, cardiac or pulmonary clearances.  Exam Pleasant female alert and oriented in no acute distress.  Patient does have bilateral expiratory wheezes.  No respiratory distress.   Plan I will request preop pulmonary and cardiac clearances.  Surgery scheduler will fax off those forms.  She states that she has a couple of friends that will assist temporarily after surgery.  We did discuss possibly needing a short-term skilled nursing facility placement for rehab.  She will speak with her friends and we will also see how she does the first couple days in the hospital.  We will also add on a right knee intra-articular Marcaine/Depo-Medrol injection under anesthesia since she also has ongoing pain there and hopefully will give her some relief immediately postop.  All questions answered.

## 2022-07-12 NOTE — Telephone Encounter (Signed)
   Pre-operative Risk Assessment    Patient Name: Desiree [name]  DOB: [birth year] MRN: [id number]      Request for Surgical Clearance    Procedure:   LEFT TOTAL KNEE ARTHROPLASTY  Date of Surgery:  Clearance 07/21/22                                 Surgeon:  [name], MD Surgeon's Group or Practice Name:  [facility] Phone number:  [phone] Fax number:  [phone]   Type of Clearance Requested:   - Medical  - Pharmacy:  Hold Aspirin NOT INDICATED HOW LONG   Type of Anesthesia:  Spinal   Additional requests/questions:    Astrid Divine   07/12/2022, 4:38 PM

## 2022-07-12 NOTE — Telephone Encounter (Signed)
Called and spoke with patient and updated her on her albuterol use. She verbalized understanding. Nothing further needed

## 2022-07-13 ENCOUNTER — Telehealth (INDEPENDENT_AMBULATORY_CARE_PROVIDER_SITE_OTHER): Payer: Medicare HMO | Admitting: Internal Medicine

## 2022-07-13 ENCOUNTER — Telehealth: Payer: Self-pay | Admitting: Pulmonary Disease

## 2022-07-13 ENCOUNTER — Other Ambulatory Visit: Payer: Self-pay

## 2022-07-13 ENCOUNTER — Encounter: Payer: Self-pay | Admitting: Internal Medicine

## 2022-07-13 ENCOUNTER — Encounter (HOSPITAL_COMMUNITY): Payer: Self-pay

## 2022-07-13 ENCOUNTER — Encounter (HOSPITAL_COMMUNITY)
Admission: RE | Admit: 2022-07-13 | Discharge: 2022-07-13 | Disposition: A | Discharge status: Home / Self Care | Payer: Medicare HMO | Source: Ambulatory Visit | Attending: Orthopaedic Surgery | Admitting: Orthopaedic Surgery

## 2022-07-13 VITALS — BP 150/77 | HR 103 | Temp 99.1°F | Resp 17 | Ht 62.0 in | Wt 169.8 lb

## 2022-07-13 VITALS — Ht 62.0 in | Wt 165.0 lb

## 2022-07-13 DIAGNOSIS — E1169 Type 2 diabetes mellitus with other specified complication: Secondary | ICD-10-CM

## 2022-07-13 DIAGNOSIS — I10 Essential (primary) hypertension: Secondary | ICD-10-CM

## 2022-07-13 DIAGNOSIS — Z87891 Personal history of nicotine dependence: Secondary | ICD-10-CM | POA: Diagnosis not present

## 2022-07-13 DIAGNOSIS — Z01812 Encounter for preprocedural laboratory examination: Secondary | ICD-10-CM | POA: Diagnosis not present

## 2022-07-13 DIAGNOSIS — R7303 Prediabetes: Secondary | ICD-10-CM | POA: Diagnosis not present

## 2022-07-13 DIAGNOSIS — Z7185 Encounter for immunization safety counseling: Secondary | ICD-10-CM | POA: Diagnosis not present

## 2022-07-13 DIAGNOSIS — M1712 Unilateral primary osteoarthritis, left knee: Secondary | ICD-10-CM

## 2022-07-13 DIAGNOSIS — Z01818 Encounter for other preprocedural examination: Secondary | ICD-10-CM

## 2022-07-13 DIAGNOSIS — I517 Cardiomegaly: Secondary | ICD-10-CM | POA: Insufficient documentation

## 2022-07-13 DIAGNOSIS — M069 Rheumatoid arthritis, unspecified: Secondary | ICD-10-CM | POA: Diagnosis not present

## 2022-07-13 LAB — CBC
HCT: 42.5 % (ref 36.0–46.0)
Hemoglobin: 13.2 g/dL (ref 12.0–15.0)
MCH: 29.3 pg (ref 26.0–34.0)
MCHC: 31.1 g/dL (ref 30.0–36.0)
MCV: 94.2 fL (ref 80.0–100.0)
Platelets: 221 10*3/uL (ref 150–400)
RBC: 4.51 MIL/uL (ref 3.87–5.11)
RDW: 13.5 % (ref 11.5–15.5)
WBC: 7.1 10*3/uL (ref 4.0–10.5)
nRBC: 0 % (ref 0.0–0.2)

## 2022-07-13 LAB — BASIC METABOLIC PANEL
Anion gap: 4 — ABNORMAL LOW (ref 5–15)
BUN: 19 mg/dL (ref 8–23)
CO2: 27 mmol/L (ref 22–32)
Calcium: 9.4 mg/dL (ref 8.9–10.3)
Chloride: 107 mmol/L (ref 98–111)
Creatinine, Ser: 1.66 mg/dL — ABNORMAL HIGH (ref 0.44–1.00)
GFR, Estimated: 33 mL/min — ABNORMAL LOW (ref >60)
Glucose, Bld: 105 mg/dL — ABNORMAL HIGH (ref 70–99)
Potassium: 4.2 mmol/L (ref 3.5–5.1)
Sodium: 138 mmol/L (ref 135–145)

## 2022-07-13 LAB — SURGICAL PCR SCREEN
MRSA, PCR: NEGATIVE
Staphylococcus aureus: NEGATIVE

## 2022-07-13 LAB — GLUCOSE, CAPILLARY: Glucose-Capillary: 117 mg/dL — ABNORMAL HIGH (ref 70–99)

## 2022-07-13 NOTE — Progress Notes (Signed)
   Virtual Visit via Telephone Note  I connected with on 07/13/22 at 11:00 AM EDT by telephone and verified that I am speaking with the correct person using two identifiers.   I discussed the limitations, risks, security and privacy concerns of performing an evaluation and management service by telephone and the limited availability of in person appointments. tThere may be a patient responsible charge related to this service. The patient expressed understanding and agreed to proceed.  Location patient: home Location provider: r home office Participants present for the call: patient, provider Patient did not have a visit in the prior 7 days to address this/these issue(s).   History of Present Illness: Desiree [name]  presents for fu visit  confusion about reasoning  does get some decrease hair patches but  not  for visit .  To have TKA  left dr Lorin Mercy sept 29 . Has had specialty eval recently and labs are stable .  Will need flu shot  this season  resp stable . No bleeding  change in raynuads  Bp has been ok    Observations/Objective: Patient sounds personable and well on the phone. I do not appreciate any SOB. Speech and thought processing are grossly intact. Patient reported vitals: Lab Results  Component Value Date   WBC 7.7 05/30/2022   HGB 13.2 05/30/2022   HCT 39.9 05/30/2022   PLT 263.0 05/30/2022   GLUCOSE 109 (H) 05/30/2022   CHOL 167 10/07/2021   TRIG 111.0 10/07/2021   HDL 56.60 10/07/2021   LDLDIRECT 146.3 01/26/2010   LDLCALC 88 10/07/2021   ALT 14 05/04/2022   AST 19 05/04/2022   NA 139 05/30/2022   K 4.5 05/30/2022   CL 103 05/30/2022   CREATININE 1.88 (H) 05/30/2022   BUN 31 (H) 05/30/2022   CO2 29 05/30/2022   TSH 1.71 06/22/2021   INR 0.9 10/07/2021   HGBA1C 6.3 05/30/2022   MICROALBUR 1.5 05/30/2022   BP Readings from Last 3 Encounters:  07/12/22 128/68  07/10/22 116/70  07/07/22 111/63     Assessment and Plan: Dm controlled  Bp controlled   PV:   disc flu vaccine and she is a good candidate for  RSV  before end of October preferred  but may be in rehab them  Can get these at pharmacy or other  counseled  OA  knee CKD stable  Rheum disease  Bronchiectasis HCM  "severe ash" on echo   2023 10 mm on valsalva  Follow Up Instructions:  As indicated     99441 5-10 99442 11-20 [id number] 21-30 I did not refer this patient for an OV in the next 24 hours for this/these issue(s).  I discussed the assessment and treatment plan with the patient. The patient was provided an opportunity to ask questions and answered. The patient agreed with the plan and demonstrated an understanding of the instructions.   The patient was advised to call back or seek an in-person evaluation if the symptoms worsen or if the condition fails to improve as anticipated.  I provided 18 minutes of non-face-to-face time during this encounter. Return for as indicated, when planned.  Shanon Ace, MD

## 2022-07-13 NOTE — Progress Notes (Signed)
PCP - Mariann Laster [name],MD Cardiologist - Aib[place]ito Chandrasekhar,MD Nephrologist- Elmarie Shiley  PPM/ICD - denies Device Orders -  Rep Notified -   Chest x-ray - 12/10/20 EKG - 07/13/22 Stress Test - 01/06/15 ECHO - 01/11/22 Cardiac Cath - none  Sleep Study - denies CPAP -   Fasting Blood Sugar - pt says she does not know how to use her meter and does not check her sugar. Advised her to check with her PCP to find out how to operate her meter.  Checks Blood Sugar _____ times a day  Blood Thinner Instructions:na Aspirin Instructions:Pt states she was advised by surgeon to stop Aspirin 5 days prior to surgery. Last dose will be 9/24,  ERAS Protcol - clear liquids until 0430  PRE-SURGERY Ensure or G2- G2  COVID TEST- n/a   Anesthesia review: yes- cardiac history   Patient denies shortness of breath, fever, cough and chest pain at PAT appointment   All instructions explained to the patient, with a verbal understanding of the material. Patient agrees to go over the instructions while at home for a better understanding. Patient also instructed to wear a mask when out in public prior to surgery .  The opportunity to ask questions was provided.

## 2022-07-13 NOTE — Progress Notes (Addendum)
Surgical Instructions    Your procedure is scheduled on Friday September 29th.  Report to [facility] Main Entrance "A" at 5:30 A.M., then check in with the Admitting office.  Call this number if you have problems the morning of surgery:  [phone]   If you have any questions prior to your surgery date call [phone]: Open Monday-Friday 8am-4pm    Remember:  Do not eat after midnight the night before your surgery  You may drink clear liquids until 4:30am the morning of your surgery.   Clear liquids allowed are: Water, Non-Citrus Juices (without pulp), Carbonated Beverages, Clear Tea, Black Coffee ONLY (NO MILK, CREAM OR POWDERED CREAMER of any kind), and Gatorade   Enhanced Recovery after Surgery for Orthopedics Enhanced Recovery after Surgery is a protocol used to improve the stress on your body and your recovery after surgery.  Patient Instructions   The day of surgery (if you have diabetes):  Drink ONE small 10 oz bottle of water or G2 by ___4:30__ am the morning of surgery This bottle was given to you during your hospital  pre-op appointment visit.  Nothing else to drink after completing the  Small 10 oz bottle of water.         If you have questions, please contact your surgeon's office.     Take these medicines the morning of surgery with A SIP OF WATER: amLODipine (NORVASC) 5 MG tablet azelastine (ASTELIN) 0.1 % nasal spray Budeson-Glycopyrrol-Formoterol (BREZTRI AEROSPHERE) 160-9-4.8 MCG/ACT AERO famotidine (PEPCID) 40 MG tablet pravastatin (PRAVACHOL) 40 MG tablet   IF NEEDED acetaminophen (TYLENOL) 500 MG tablet albuterol (VENTOLIN HFA) 108 (90 Base) MCG/ACT inhaler  Carboxymethylcellulose Sodium (THERATEARS) 0.25 % SOLN ondansetron (ZOFRAN-ODT) 4 MG disintegrating tablet   Please bring inhalers with you to the hospital   PLEASE STOP your hydroxychloroquine (PLAQUENIL)  three (3) days prior to surgery  Follow your surgeon's instructions on when to  stop Aspirin.  If no instructions were given by your surgeon then you will need to call the office to get those instructions.    As of today, STOP taking any Aspirin (unless otherwise instructed by your surgeon) Aleve, Naproxen, Ibuprofen, Motrin, Advil, Goody's, BC's, all herbal medications, fish oil, and all vitamins.   HOW TO MANAGE YOUR DIABETES BEFORE AND AFTER SURGERY  Why is it important to control my blood sugar before and after surgery? Improving blood sugar levels before and after surgery helps healing and can limit problems. A way of improving blood sugar control is eating a healthy diet by:  Eating less sugar and carbohydrates  Increasing activity/exercise  Talking with your doctor about reaching your blood sugar goals High blood sugars (greater than 180 mg/dL) can raise your risk of infections and slow your recovery, so you will need to focus on controlling your diabetes during the weeks before surgery. Make sure that the doctor who takes care of your diabetes knows about your planned surgery including the date and location.  How do I manage my blood sugar before surgery? Check your blood sugar at least 4 times a day, starting 2 days before surgery, to make sure that the level is not too high or low.  Check your blood sugar the morning of your surgery when you wake up and every 2 hours until you get to the Short Stay unit.  If your blood sugar is less than 70 mg/dL, you will need to treat for low blood sugar: Do not take insulin. Treat a low blood sugar (less  than 70 mg/dL) with  cup of clear juice (cranberry or apple), 4 glucose tablets, OR glucose gel. Recheck blood sugar in 15 minutes after treatment (to make sure it is greater than 70 mg/dL). If your blood sugar is not greater than 70 mg/dL on recheck, call [phone] for further instructions. Report your blood sugar to the short stay nurse when you get to Short Stay.  If you are admitted to the hospital after  surgery: Your blood sugar will be checked by the staff and you will probably be given insulin after surgery (instead of oral diabetes medicines) to make sure you have good blood sugar levels. The goal for blood sugar control after surgery is 80-180 mg/dL.   PREPARING FOR SURGERY  Do not wear jewelry or makeup. Do not wear lotions, powders, perfumes or deodorant. Do not shave 48 hours prior to surgery.   Do not bring valuables to the hospital. Do not wear nail polish, gel polish, artificial nails, or any other type of covering on natural nails (fingers and toes) If you have artificial nails or gel coating that need to be removed by a nail salon, please have this removed prior to surgery. Artificial nails or gel coating may interfere with anesthesia's ability to adequately monitor your vital signs.  [facility] is not responsible for any belongings or valuables.    Do NOT Smoke (Tobacco/Vaping)  24 hours prior to your procedure  If you use a CPAP at night, you may bring your mask for your overnight stay.   Contacts, glasses, hearing aids, dentures or partials may not be worn into surgery, please bring cases for these belongings   For patients admitted to the hospital, discharge time will be determined by your treatment team.   Patients discharged the day of surgery will not be allowed to drive home, and someone needs to stay with them for 24 hours.   SURGICAL WAITING ROOM VISITATION Patients having surgery or a procedure may have no more than 2 support people in the waiting area - these visitors may rotate.   Children under the age of 60 must have an adult with them who is not the patient. If the patient needs to stay at the hospital during part of their recovery, the visitor guidelines for inpatient rooms apply. Pre-op nurse will coordinate an appropriate time for 1 support person to accompany patient in pre-op.  This support person may not rotate.   Please refer to the [facility]  website for the visitor guidelines for Inpatients (after your surgery is over and you are in a regular room).    Special instructions:    Oral Hygiene is also important to reduce your risk of infection.  Remember - BRUSH YOUR TEETH THE MORNING OF SURGERY WITH YOUR REGULAR TOOTHPASTE   [facility]- Preparing For Surgery  Before surgery, you can play an important role. Because skin is not sterile, your skin needs to be as free of germs as possible. You can reduce the number of germs on your skin by washing with CHG (chlorahexidine gluconate) Soap before surgery.  CHG is an antiseptic cleaner which kills germs and bonds with the skin to continue killing germs even after washing.     Please do not use if you have an allergy to CHG or antibacterial soaps. If your skin becomes reddened/irritated stop using the CHG.  Do not shave (including legs and underarms) for at least 48 hours prior to first CHG shower. It is OK to shave your face.  Please follow these instructions carefully.     Shower the NIGHT BEFORE SURGERY and the MORNING OF SURGERY with CHG Soap.   If you chose to wash your hair, wash your hair first as usual with your normal shampoo. After you shampoo, rinse your hair and body thoroughly to remove the shampoo.  Then [employer] and genitals (private parts) with your normal soap and rinse thoroughly to remove soap.  After that Use CHG Soap as you would any other liquid soap. You can apply CHG directly to the skin and wash gently with a scrungie or a clean washcloth.   Apply the CHG Soap to your body ONLY FROM THE NECK DOWN.  Do not use on open wounds or open sores. Avoid contact with your eyes, ears, mouth and genitals (private parts). Wash Face and genitals (private parts)  with your normal soap.   Wash thoroughly, paying special attention to the area where your surgery will be performed.  Thoroughly rinse your body with warm water from the neck down.  DO NOT shower/wash with your  normal soap after using and rinsing off the CHG Soap.  Pat yourself dry with a CLEAN TOWEL.  Wear CLEAN PAJAMAS to bed the night before surgery  Place CLEAN SHEETS on your bed the night before your surgery  DO NOT SLEEP WITH PETS.   Day of Surgery:  Take a shower with CHG soap. Wear Clean/Comfortable clothing the morning of surgery Do not apply any deodorants/lotions.   Remember to brush your teeth WITH YOUR REGULAR TOOTHPASTE.    If you received a COVID test during your pre-op visit, it is requested that you wear a mask when out in public, stay away from anyone that may not be feeling well, and notify your surgeon if you develop symptoms. If you have been in contact with anyone that has tested positive in the last 10 days, please notify your surgeon.    Please read over the following fact sheets that you were given.

## 2022-07-13 NOTE — Telephone Encounter (Signed)
Fax received from Dr. Rodell Perna with Ortho Care at [facility] to perform a LEFT TOTAL KNEE ARTHROPLASTY on patient.  Patient needs surgery clearance. Surgery is 07/21/2022. Patient was seen on 04/28/2022. Office protocol is a risk assessment can be sent to surgeon if patient has been seen in 60 days or less.   Sending to Dr Silas Flood for risk assessment or recommendations if patient needs to be seen in office prior to surgical procedure.

## 2022-07-14 ENCOUNTER — Telehealth: Payer: Self-pay | Admitting: Orthopaedic Surgery

## 2022-07-14 NOTE — Anesthesia Preprocedure Evaluation (Addendum)
Anesthesia Evaluation  Patient identified by MRN, date of birth, ID band Patient awake    Reviewed: Allergy & Precautions, NPO status , Patient's Chart, lab work & pertinent test results  History of Anesthesia Complications Negative for: history of anesthetic complications  Airway Mallampati: I  TM Distance: >3 FB Neck ROM: Full    Dental  (+) Edentulous Upper, Dental Advisory Given   Pulmonary COPD, former smoker,    Pulmonary exam normal        Cardiovascular hypertension, Normal cardiovascular exam     Neuro/Psych PSYCHIATRIC DISORDERS Anxiety Depression Bipolar Disorder CVA    GI/Hepatic Neg liver ROS, hiatal hernia, GERD  ,  Endo/Other  diabetes  Renal/GU Renal InsufficiencyRenal disease     Musculoskeletal negative musculoskeletal ROS (+)   Abdominal   Peds  Hematology negative hematology ROS (+)   Anesthesia Other Findings   Reproductive/Obstetrics                           Anesthesia Physical Anesthesia Plan  ASA: 3  Anesthesia Plan: Spinal   Post-op Pain Management: Tylenol PO (pre-op)* and Regional block*   Induction:   PONV Risk Score and Plan: 3 and Ondansetron, Propofol infusion and Midazolam  Airway Management Planned: Simple Face Mask  Additional Equipment:   Intra-op Plan:   Post-operative Plan:   Informed Consent: I have reviewed the patients History and Physical, chart, labs and discussed the procedure including the risks, benefits and alternatives for the proposed anesthesia with the patient or authorized representative who has indicated his/her understanding and acceptance.     Dental advisory given  Plan Discussed with: Anesthesiologist and CRNA  Anesthesia Plan Comments: (PAT note by Karoline Caldwell, PA-C: Former smoker with associated COPD and chronic mild bronchiectasis.  She is maintained on Breztri and as needed albuterol.  She also uses vest therapy  and flutter valve.  Pulmonary preoperative risk assessment per telephone encounter by Dr. Silas Flood 07/14/2022, "Pulmonary medicine does not provide preoperative clearance but rather a preoperative risk assessment. Based on the ARSICAT model, patient is low risk or 1.3% risk of postoperative pulmonary complication if duration of surgery is less than 3 hours. If duration of surgery is greater than 3 hours, patient is intermediate or 13.3% risk of postoperative pulmonary complication. No modifiable risk factors identified prior to surgery. Please see below for recommendations: --Please provide DuoNebs in the preoperative area and in the PACU --Avoid general anesthesia if able, if general anesthesia is needed avoid prolonged neuromuscular blockade --Given history of COPD, recommend checking intraoperative blood gas --Given history of COPD, recommend extubation to BiPAP and postoperative blood gas if retaining CO2 during intraoperative blood gas --If patient is admitted to the hospital recommend disposition to progressive or stepdown unit."  History of C5-6 ACDF.  Diet controlled DM2, last A1c 6.3 on 05/30/2022.  CKD 3.  Labs reviewed, creatinine elevated to 1.66 consistent with history of CKD 3, otherwise unremarkable.  EKG 07/13/2022: NSR.  Rate 83.  LAD.  Septal infarct, age undetermined.  PFTs 10/31/2017: FVC-%Pred-Pre % 79 FEV1-%Pred-Pre % 73 FEV1FVC-%Pred-Pre % 91 TLC % pred % 128 RV % pred % 179 DLCO unc % pred % 59  CHEST - 2 VIEW 12/10/2020: COMPARISON: 01/14/2020  FINDINGS: Normal heart size, mediastinal contours, and pulmonary vascularity.  Lungs clear.  No pulmonary infiltrate, pleural effusion, or pneumothorax.  Degenerative changes at the shoulders and throughout thoracic spine.  Prior cervical and lumbar fusions with levoconvex thoracic  scoliosis.  IMPRESSION: No acute abnormalities.  Limited echo 01/11/2022: 1. Peak LVOT gradient 10 mmHg with valsalva. Left  ventricular ejection  fraction, by estimation, is 65 to 70%. The left ventricle has normal  function. The left ventricle has no regional wall motion abnormalities.  There is severe asymmetric left  ventricular hypertrophy of the basal-septal segment. Findings consistent  with hypertrophic cardiomyopathy. No dynamic LVOT obstruction wtih  valsalva. The patient was unable to exercise for the stress portion. Left  ventricular diastolic parameters are  consistent with Grade I diastolic dysfunction (impaired relaxation).  2. The mitral valve is abnormal. Trivial mitral valve regurgitation.   Comparison(s): No significant change from prior study. 05/31/20: LVEF  70-75%.   Cardiac MRI 12/08/2021: IMPRESSION: 1. Severe asymmetric LV hypertrophy measuring up to 101mm in basal septum (76mm in posterior wall), consistent with hypertrophic cardiomyopathy  2. Small amount of patchy late gadolinium enhancement in septum, consistent with HCM. LGE accounts for <1% of total myocardial mass  3. Small LV size with hyperdynamic systolic function (EF 123456)  4. Small RV size with normal systolic function (EF [id number])  )      Anesthesia Quick Evaluation

## 2022-07-14 NOTE — Telephone Encounter (Signed)
Pulmonary medicine does not provide preoperative clearance but rather a preoperative risk assessment.  Based on the ARSICAT model, patient is low risk or 1.3% risk of postoperative pulmonary complication if duration of surgery is less than 3 hours.  If duration of surgery is greater than 3 hours, patient is intermediate or 13.3% risk of postoperative pulmonary complication.  No modifiable risk factors identified prior to surgery.  Please see below for recommendations: --Please provide DuoNebs in the preoperative area and in the PACU --Avoid general anesthesia if able, if general anesthesia is needed avoid prolonged neuromuscular blockade --Given history of COPD, recommend checking intraoperative blood gas --Given history of COPD, recommend extubation to BiPAP and postoperative blood gas if retaining CO2 during intraoperative blood gas --If patient is admitted to the hospital recommend disposition to progressive or stepdown unit

## 2022-07-14 NOTE — Progress Notes (Signed)
Anesthesia Chart Review:  Desiree [name] cardiology for history of hypertrophic cardiomyopathy with no resting LVOT gradient.  History of atypical chest pain with nonischemic Myoview 2016.  Preop clearance per telephone encounter by Nicholes Rough, PA-C on 07/14/2022, "Chart reviewed as part of pre-operative protocol coverage. Pre-op clearance already addressed by colleagues in earlier phone notes. To summarize recommendations: -Desiree [name] has well compensated HCM; Desiree [name] is at moderate risk related to her COPD and presence of cardiomyopathy but Desiree [name] is well compensated.  It would be reasonable to proceed -Dr. Gasper Sells. Okay to hold aspirin 7 days prior to the procedure.  Please restart medically safe to do so."  Follows with rheumatology for history of rheumatoid arthritis and Raynaud's disease.  Maintained on hydroxychloroquine.  Former smoker with associated COPD and chronic mild bronchiectasis.  Desiree [name] is maintained on Breztri and as needed albuterol.  Desiree [name] also uses vest therapy and flutter valve.  Pulmonary preoperative risk assessment per telephone encounter by Dr. Silas Flood 07/14/2022, "Pulmonary medicine does not provide preoperative clearance but rather a preoperative risk assessment.  Based on the ARSICAT model, patient is low risk or 1.3% risk of postoperative pulmonary complication if duration of surgery is less than 3 hours.  If duration of surgery is greater than 3 hours, patient is intermediate or 13.3% risk of postoperative pulmonary complication.  No modifiable risk factors identified prior to surgery.  Please see below for recommendations: --Please provide DuoNebs in the preoperative area and in the PACU --Avoid general anesthesia if able, if general anesthesia is needed avoid prolonged neuromuscular blockade --Given history of COPD, recommend checking intraoperative blood gas --Given history of COPD, recommend extubation to BiPAP and postoperative blood gas if retaining CO2 during intraoperative blood gas --If  patient is admitted to the hospital recommend disposition to progressive or stepdown unit."  History of C5-6 ACDF.  Diet controlled DM2, last A1c 6.3 on 05/30/2022.  CKD 3.  Labs reviewed, creatinine elevated to 1.66 consistent with history of CKD 3, otherwise unremarkable.  EKG 07/13/2022: NSR.  Rate 83.  LAD.  Septal infarct, age undetermined.  PFTs 10/31/2017: FVC-%Pred-Pre % 79  FEV1-%Pred-Pre % 73  FEV1FVC-%Pred-Pre % 91  TLC % pred % 128  RV % pred % 179  DLCO unc % pred % 59   CHEST - 2 VIEW 12/10/2020: COMPARISON:  01/14/2020   FINDINGS: Normal heart size, mediastinal contours, and pulmonary vascularity.   Lungs clear.   No pulmonary infiltrate, pleural effusion, or pneumothorax.   Degenerative changes at the shoulders and throughout thoracic spine.   Prior cervical and lumbar fusions with levoconvex thoracic scoliosis.   IMPRESSION: No acute abnormalities.  Limited echo 01/11/2022:  1. Peak LVOT gradient 10 mmHg with valsalva. Left ventricular ejection  fraction, by estimation, is 65 to 70%. The left ventricle has normal  function. The left ventricle has no regional wall motion abnormalities.  There is severe asymmetric left  ventricular hypertrophy of the basal-septal segment. Findings consistent  with hypertrophic cardiomyopathy. No dynamic LVOT obstruction wtih  valsalva. The patient was unable to exercise for the stress portion. Left  ventricular diastolic parameters are  consistent with Grade I diastolic dysfunction (impaired relaxation).   2. The mitral valve is abnormal. Trivial mitral valve regurgitation.   Comparison(s): No significant change from prior study. 05/31/20: LVEF  70-75%.   Cardiac MRI 12/08/2021: IMPRESSION: 1. Severe asymmetric LV hypertrophy measuring up to 3mm in basal septum (12mm in posterior wall), consistent with hypertrophic cardiomyopathy   2. Small amount of patchy late gadolinium  enhancement in septum, consistent with HCM.  LGE accounts for <1% of total myocardial mass   3.  Small LV size with hyperdynamic systolic function (EF 49%)   4.  Small RV size with normal systolic function (EF 82%)     Wynonia Musty [facility] Short Stay Center/Anesthesiology Phone [phone] 07/14/2022 4:55 PM

## 2022-07-14 NOTE — Telephone Encounter (Signed)
   Patient Name: Desiree [name]  DOB: [birth year] MRN: [id number]  Primary Cardiologist: [name], MD  Chart reviewed as part of pre-operative protocol coverage. Pre-op clearance already addressed by colleagues in earlier phone notes. To summarize recommendations:  -She has well compensated HCM; she is at moderate risk related to her COPD and presence of cardiomyopathy but she is well compensated.  It would be reasonable to proceed -Dr. Jeani Hawking to hold aspirin 7 days prior to the procedure.  Please restart medically safe to do so.  Will route this bundled recommendation to requesting provider via Epic fax function and remove from pre-op pool. Please call with questions.  Elgie Collard, PA-C 07/14/2022, 8:46 AM

## 2022-07-14 NOTE — Telephone Encounter (Signed)
OV notes and clearance form have been faxed back to ortho Care at [facility]. Nothing further needed at this time.

## 2022-07-14 NOTE — Telephone Encounter (Signed)
Patient would like to know if she can take her tylenol. [phone]

## 2022-07-16 NOTE — Progress Notes (Signed)
No show appointment  

## 2022-07-17 ENCOUNTER — Other Ambulatory Visit: Payer: Self-pay | Admitting: Internal Medicine

## 2022-07-17 NOTE — Telephone Encounter (Signed)
IC patient verbalized understanding.

## 2022-07-19 ENCOUNTER — Telehealth: Payer: Medicare HMO

## 2022-07-19 ENCOUNTER — Telehealth: Payer: Self-pay | Admitting: Pharmacist

## 2022-07-19 ENCOUNTER — Telehealth: Payer: Self-pay | Admitting: *Deleted

## 2022-07-19 NOTE — Progress Notes (Signed)
Chronic Care Management Pharmacy Assistant   Name: Desiree [name]  MRN: [id number] DOB: Jan 09, 1951  Reason for Encounter: Disease State   Conditions to be addressed/monitored: DMII  Recent office visits:  07/13/22 [name], MD - Patient presented for Type 2 diabetes mellitus with other specified complication without long term current use of insulin. Stopped Triamcinolone Acetonide.  Recent consult visits:  07/13/22 [name], MD (Ortho Surg) - Patient presented to [facility] for [place]. No medication  changes.  07/12/22 Lanae Crumbly, PA-C ([facility]) - Patient presented for Unilateral primary osteoarthritis of left knee and other concerns.  07/10/22 Rudean Haskell, MD  ([place]) - Patient presented for Hypertrophic cardiomyopathy and other concerns. Stopped Losartan Potassium. Stopped Nitroglycerin.  07/07/22 [name], MD (Ortho Surg) - Patient presented for Chronic pain of both knees and other concerns. No medication changes.  Hospital visits:  None in previous 6 months  Medications: Outpatient Encounter Medications as of 07/19/2022  Medication Sig   Accu-Chek Softclix Lancets lancets Use to test blood sugar daily. Dx:e11.9   acetaminophen (TYLENOL) 500 MG tablet Take 1,000 mg by mouth every 6 (six) hours as needed for moderate pain, mild pain or headache.   albuterol (VENTOLIN HFA) 108 (90 Base) MCG/ACT inhaler Inhale 2 puffs into the lungs every 6 (six) hours as needed for wheezing or shortness of breath.   Alcohol Swabs (DROPSAFE ALCOHOL PREP) 70 % PADS USE AS DIRECTED   amLODipine (NORVASC) 5 MG tablet TAKE 1 TABLET EVERY MORNING   aspirin EC 81 MG tablet Take 81 mg by mouth daily.   azelastine (ASTELIN) 0.1 % nasal spray USE 2 SPRAYS IN EACH NOSTRIL TWICE DAILY AS DIRECTED   Blood Glucose Monitoring Suppl (TRUE METRIX AIR GLUCOSE METER) w/Device KIT Use as directed DX:11.9   Budeson-Glycopyrrol-Formoterol  (BREZTRI AEROSPHERE) 160-9-4.8 MCG/ACT AERO Inhale 2 puffs into the lungs in the morning and at bedtime.   Carboxymethylcellulose Sodium (THERATEARS) 0.25 % SOLN Place 1 drop into both eyes daily as needed (Dry eyes).   cetirizine (ZYRTEC) 10 MG tablet Take 10 mg by mouth at bedtime.    diclofenac sodium (VOLTAREN) 1 % GEL APPLY 2-4 GRAMS TO AFFECTED JOINTS UP TO FOUR TIMES DAILY (Patient not taking: Reported on 07/12/2022)   docusate sodium (DULCOLAX) 100 MG capsule Take 100 mg by mouth as needed for mild constipation or moderate constipation.   famotidine (PEPCID) 40 MG tablet TAKE 1 TABLET EVERY DAY   feeding supplement, ENSURE ENLIVE, (ENSURE ENLIVE) LIQD Take 237 mLs by mouth 3 (three) times daily between meals. (Patient taking differently: Take 237 mLs by mouth 2 (two) times daily between meals. vanilla)   fluticasone (FLONASE) 50 MCG/ACT nasal spray Place 2 sprays into both nostrils at bedtime.   glucose blood (TRUE METRIX BLOOD GLUCOSE TEST) test strip USE AS INSTRUCTED   hydroxychloroquine (PLAQUENIL) 200 MG tablet TAKE 1 TABLET EVERY DAY   montelukast (SINGULAIR) 10 MG tablet TAKE 1 TABLET AT BEDTIME   Multiple Vitamins-Minerals (CENTRUM SILVER 50+WOMEN) TABS Take 1 tablet by mouth daily.   neomycin-bacitracin-polymyxin (NEOSPORIN) [phone] ointment Apply 1 Application topically daily as needed (on fingers).   ondansetron (ZOFRAN-ODT) 4 MG disintegrating tablet DISSOLVE 1 TABLET ON THE TONGUE EVERY 8 HOURS AS NEEDED FOR NAUSEA OR VOMITING (Patient taking differently: Take 4 mg by mouth 2 (two) times daily.)   pravastatin (PRAVACHOL) 40 MG tablet TAKE 1 TABLET EVERY DAY   Respiratory Therapy Supplies (FLUTTER) DEVI  1 Device by Does not apply route as directed.   TRUEplus Lancets 33G MISC Use as directed DX:11.9   No facility-administered encounter medications on file as of 07/19/2022.  Notes: Call to patient to offer in office appointment with [name] Clinical Pharmacist for  education on Glucometer and Blood pressure cuff. Did not reach left voicemail for return call with my contact information.  Patient returned call stating she would give me a call after her upcoming knee replacement and we can schedule depending on how well she is feeling and able to get around. She reports she did last week at [facility] get her Flu Shot as well as the RSV vaccine and has planned to do the COVID Booster after surgery as well.  Care Gaps: Zoster Vaccine - Overdue Foot Exam - Overdue PNA Vaccine - Overdue COVID Booster - Overdue Flu Vaccine - Done 9/23 Mammogram - Postponed DEXA - Postponed BP- 128/68 07/12/22  Star Rating Drugs: Losartan 25 mg - Last filled 04/2122 90 DS at [facility] Pravastatin 40 mg - Last filled 05/02/22 90 DS at [facility] Pharmacist Assistant [phone]

## 2022-07-19 NOTE — Telephone Encounter (Signed)
Ortho bundle pre-op call; please see care plan note.

## 2022-07-19 NOTE — Care Plan (Signed)
[facility] RNCM met with patient on 07/12/22 during her H&P appointment with Dr. Lorin Mercy' PA. Also spoke with her again on 07/19/22 regarding her upcoming Left total knee arthroplasty with Dr. Lorin Mercy on 07/21/22. She is an Ortho bundle patient through [facility] and is agreeable to case management. She lives alone, but has friends and family that live nearby and a friend that will be coming to assist her after discharge. She has all DME needed from previous Orthopedic surgery. Anticipate HHPT will be needed after a short hospital stay. Referral made to [facility] after choice provided. Reviewed all post op instructions. Will continue to follow for needs.

## 2022-07-21 ENCOUNTER — Encounter (HOSPITAL_COMMUNITY)
Admission: RE | Disposition: A | Discharge status: Home / Self Care | Payer: Self-pay | Source: Home / Self Care | Attending: Orthopaedic Surgery

## 2022-07-21 ENCOUNTER — Ambulatory Visit (HOSPITAL_COMMUNITY): Payer: Medicare HMO | Admitting: Physician Assistant

## 2022-07-21 ENCOUNTER — Observation Stay (HOSPITAL_COMMUNITY): Payer: Medicare HMO

## 2022-07-21 ENCOUNTER — Encounter (HOSPITAL_COMMUNITY): Payer: Self-pay | Admitting: Orthopaedic Surgery

## 2022-07-21 ENCOUNTER — Observation Stay (HOSPITAL_COMMUNITY)
Admission: RE | Admit: 2022-07-21 | Discharge: 2022-07-24 | Disposition: A | Discharge status: Home / Self Care | Payer: Medicare HMO | Attending: Orthopaedic Surgery | Admitting: Orthopaedic Surgery

## 2022-07-21 ENCOUNTER — Other Ambulatory Visit: Payer: Self-pay

## 2022-07-21 ENCOUNTER — Ambulatory Visit (HOSPITAL_BASED_OUTPATIENT_CLINIC_OR_DEPARTMENT_OTHER): Payer: Medicare HMO | Admitting: Anesthesiology

## 2022-07-21 DIAGNOSIS — I10 Essential (primary) hypertension: Secondary | ICD-10-CM

## 2022-07-21 DIAGNOSIS — Z87891 Personal history of nicotine dependence: Secondary | ICD-10-CM | POA: Diagnosis not present

## 2022-07-21 DIAGNOSIS — Z8673 Personal history of transient ischemic attack (TIA), and cerebral infarction without residual deficits: Secondary | ICD-10-CM | POA: Diagnosis not present

## 2022-07-21 DIAGNOSIS — Z96652 Presence of left artificial knee joint: Secondary | ICD-10-CM | POA: Diagnosis not present

## 2022-07-21 DIAGNOSIS — I129 Hypertensive chronic kidney disease with stage 1 through stage 4 chronic kidney disease, or unspecified chronic kidney disease: Secondary | ICD-10-CM | POA: Insufficient documentation

## 2022-07-21 DIAGNOSIS — M1712 Unilateral primary osteoarthritis, left knee: Secondary | ICD-10-CM | POA: Diagnosis not present

## 2022-07-21 DIAGNOSIS — J45909 Unspecified asthma, uncomplicated: Secondary | ICD-10-CM | POA: Insufficient documentation

## 2022-07-21 DIAGNOSIS — N183 Chronic kidney disease, stage 3 unspecified: Secondary | ICD-10-CM | POA: Diagnosis not present

## 2022-07-21 DIAGNOSIS — Z96641 Presence of right artificial hip joint: Secondary | ICD-10-CM | POA: Insufficient documentation

## 2022-07-21 DIAGNOSIS — Z794 Long term (current) use of insulin: Secondary | ICD-10-CM | POA: Insufficient documentation

## 2022-07-21 DIAGNOSIS — Z96642 Presence of left artificial hip joint: Secondary | ICD-10-CM | POA: Diagnosis not present

## 2022-07-21 DIAGNOSIS — J441 Chronic obstructive pulmonary disease with (acute) exacerbation: Secondary | ICD-10-CM | POA: Diagnosis not present

## 2022-07-21 DIAGNOSIS — G8918 Other acute postprocedural pain: Secondary | ICD-10-CM | POA: Diagnosis not present

## 2022-07-21 DIAGNOSIS — E1122 Type 2 diabetes mellitus with diabetic chronic kidney disease: Secondary | ICD-10-CM | POA: Diagnosis not present

## 2022-07-21 DIAGNOSIS — Z79899 Other long term (current) drug therapy: Secondary | ICD-10-CM | POA: Diagnosis not present

## 2022-07-21 DIAGNOSIS — Z01818 Encounter for other preprocedural examination: Secondary | ICD-10-CM

## 2022-07-21 DIAGNOSIS — J449 Chronic obstructive pulmonary disease, unspecified: Secondary | ICD-10-CM | POA: Diagnosis not present

## 2022-07-21 DIAGNOSIS — Z9889 Other specified postprocedural states: Secondary | ICD-10-CM | POA: Diagnosis not present

## 2022-07-21 DIAGNOSIS — Z471 Aftercare following joint replacement surgery: Secondary | ICD-10-CM | POA: Diagnosis not present

## 2022-07-21 DIAGNOSIS — E119 Type 2 diabetes mellitus without complications: Secondary | ICD-10-CM | POA: Diagnosis not present

## 2022-07-21 HISTORY — PX: TOTAL KNEE ARTHROPLASTY: SHX125

## 2022-07-21 HISTORY — PX: INJECTION KNEE: SHX2446

## 2022-07-21 LAB — GLUCOSE, CAPILLARY
Glucose-Capillary: 124 mg/dL — ABNORMAL HIGH (ref 70–99)
Glucose-Capillary: 84 mg/dL (ref 70–99)

## 2022-07-21 SURGERY — ARTHROPLASTY, KNEE, TOTAL
Anesthesia: Spinal | Site: Knee | Laterality: Right

## 2022-07-21 MED ORDER — BUDESON-GLYCOPYRROL-FORMOTEROL 160-9-4.8 MCG/ACT IN AERO: 2.0000 | INHALATION_SPRAY | Freq: Two times a day (BID) | RESPIRATORY_TRACT | Status: DC

## 2022-07-21 MED ORDER — VANCOMYCIN HCL IN DEXTROSE 1-5 GM/200ML-% IV SOLN
1000.0000 mg | INTRAVENOUS | Status: AC
  Administered 2022-07-21: 1000 mg via INTRAVENOUS
  Filled 2022-07-21: qty 200

## 2022-07-21 MED ORDER — FENTANYL CITRATE (PF) 250 MCG/5ML IJ SOLN
INTRAMUSCULAR | Status: AC
  Filled 2022-07-21: qty 5

## 2022-07-21 MED ORDER — METHOCARBAMOL 500 MG PO TABS
ORAL_TABLET | ORAL | Status: AC
  Filled 2022-07-21: qty 1

## 2022-07-21 MED ORDER — FLUTICASONE PROPIONATE 50 MCG/ACT NA SUSP
2.0000 | Freq: Every day | NASAL | Status: DC
  Administered 2022-07-21 – 2022-07-23 (×3): 2 via NASAL
  Filled 2022-07-21: qty 16

## 2022-07-21 MED ORDER — OXYCODONE HCL 5 MG PO TABS
ORAL_TABLET | ORAL | Status: AC
  Administered 2022-07-21: 5 mg via ORAL
  Filled 2022-07-21: qty 1

## 2022-07-21 MED ORDER — PHENYLEPHRINE HCL-NACL 20-0.9 MG/250ML-% IV SOLN
INTRAVENOUS | Status: DC | PRN
  Administered 2022-07-21: 40 ug/min via INTRAVENOUS

## 2022-07-21 MED ORDER — AMLODIPINE BESYLATE 5 MG PO TABS
5.0000 mg | ORAL_TABLET | Freq: Every morning | ORAL | Status: DC
  Administered 2022-07-22 – 2022-07-24 (×3): 5 mg via ORAL
  Filled 2022-07-21 (×3): qty 1

## 2022-07-21 MED ORDER — FENTANYL CITRATE (PF) 250 MCG/5ML IJ SOLN
INTRAMUSCULAR | Status: DC | PRN
  Administered 2022-07-21 (×2): 25 ug via INTRAVENOUS

## 2022-07-21 MED ORDER — LIDOCAINE 2% (20 MG/ML) 5 ML SYRINGE
INTRAMUSCULAR | Status: AC
  Filled 2022-07-21: qty 5

## 2022-07-21 MED ORDER — POLYVINYL ALCOHOL 1.4 % OP SOLN: 1.0000 [drp] | Freq: Every day | OPHTHALMIC | Status: DC | PRN

## 2022-07-21 MED ORDER — BUPIVACAINE IN DEXTROSE 0.75-8.25 % IT SOLN
INTRATHECAL | Status: DC | PRN
  Administered 2022-07-21: 1.8 mL via INTRATHECAL

## 2022-07-21 MED ORDER — ORAL CARE MOUTH RINSE: 15.0000 mL | Freq: Once | OROMUCOSAL | Status: AC

## 2022-07-21 MED ORDER — ONDANSETRON HCL 4 MG/2ML IJ SOLN
4.0000 mg | Freq: Four times a day (QID) | INTRAMUSCULAR | Status: DC | PRN
  Administered 2022-07-23: 4 mg via INTRAVENOUS
  Filled 2022-07-21 (×2): qty 2

## 2022-07-21 MED ORDER — SODIUM CHLORIDE 0.9 % IR SOLN
Status: DC | PRN
  Administered 2022-07-21: 3000 mL

## 2022-07-21 MED ORDER — INSULIN ASPART 100 UNIT/ML IJ SOLN: 0.0000 [IU] | INTRAMUSCULAR | Status: DC | PRN

## 2022-07-21 MED ORDER — LACTATED RINGERS IV SOLN: INTRAVENOUS | Status: DC

## 2022-07-21 MED ORDER — ALBUTEROL SULFATE HFA 108 (90 BASE) MCG/ACT IN AERS: 2.0000 | INHALATION_SPRAY | Freq: Four times a day (QID) | RESPIRATORY_TRACT | Status: DC | PRN

## 2022-07-21 MED ORDER — FENTANYL CITRATE (PF) 100 MCG/2ML IJ SOLN: 25.0000 ug | INTRAMUSCULAR | Status: DC | PRN

## 2022-07-21 MED ORDER — DOCUSATE SODIUM 100 MG PO CAPS
100.0000 mg | ORAL_CAPSULE | Freq: Two times a day (BID) | ORAL | Status: DC
  Administered 2022-07-22 – 2022-07-24 (×5): 100 mg via ORAL
  Filled 2022-07-21 (×6): qty 1

## 2022-07-21 MED ORDER — CARBOXYMETHYLCELLULOSE SODIUM 0.25 % OP SOLN: 1.0000 [drp] | Freq: Every day | OPHTHALMIC | Status: DC | PRN

## 2022-07-21 MED ORDER — LORATADINE 10 MG PO TABS
10.0000 mg | ORAL_TABLET | Freq: Every day | ORAL | Status: DC
  Administered 2022-07-21 – 2022-07-24 (×4): 10 mg via ORAL
  Filled 2022-07-21 (×4): qty 1

## 2022-07-21 MED ORDER — HYDROMORPHONE HCL 1 MG/ML IJ SOLN
0.5000 mg | INTRAMUSCULAR | Status: DC | PRN
  Administered 2022-07-22 (×2): 0.5 mg via INTRAVENOUS
  Filled 2022-07-21 (×3): qty 0.5

## 2022-07-21 MED ORDER — CHLORHEXIDINE GLUCONATE 0.12 % MT SOLN
15.0000 mL | Freq: Once | OROMUCOSAL | Status: AC
  Administered 2022-07-21: 15 mL via OROMUCOSAL
  Filled 2022-07-21: qty 15

## 2022-07-21 MED ORDER — UMECLIDINIUM BROMIDE 62.5 MCG/ACT IN AEPB
1.0000 | INHALATION_SPRAY | Freq: Every day | RESPIRATORY_TRACT | Status: DC
  Administered 2022-07-23 – 2022-07-24 (×2): 1 via RESPIRATORY_TRACT
  Filled 2022-07-21: qty 7

## 2022-07-21 MED ORDER — FLUTICASONE FUROATE-VILANTEROL 200-25 MCG/ACT IN AEPB
1.0000 | INHALATION_SPRAY | Freq: Every day | RESPIRATORY_TRACT | Status: DC
  Administered 2022-07-23 – 2022-07-24 (×2): 1 via RESPIRATORY_TRACT
  Filled 2022-07-21: qty 28

## 2022-07-21 MED ORDER — ROPIVACAINE HCL 7.5 MG/ML IJ SOLN
INTRAMUSCULAR | Status: DC | PRN
  Administered 2022-07-21: 20 mL via PERINEURAL

## 2022-07-21 MED ORDER — 0.9 % SODIUM CHLORIDE (POUR BTL) OPTIME
TOPICAL | Status: DC | PRN
  Administered 2022-07-21: 1000 mL

## 2022-07-21 MED ORDER — HYDROMORPHONE HCL 1 MG/ML IJ SOLN
INTRAMUSCULAR | Status: AC
  Administered 2022-07-21: 0.5 mg via INTRAVENOUS
  Filled 2022-07-21: qty 1

## 2022-07-21 MED ORDER — ONDANSETRON HCL 4 MG/2ML IJ SOLN
INTRAMUSCULAR | Status: AC
  Filled 2022-07-21: qty 2

## 2022-07-21 MED ORDER — METHYLPREDNISOLONE ACETATE 40 MG/ML IJ SUSP
INTRAMUSCULAR | Status: DC | PRN
  Administered 2022-07-21: 4 mL via INTRA_ARTICULAR

## 2022-07-21 MED ORDER — METOCLOPRAMIDE HCL 5 MG PO TABS: 5.0000 mg | ORAL_TABLET | Freq: Three times a day (TID) | ORAL | Status: DC | PRN

## 2022-07-21 MED ORDER — ALBUTEROL SULFATE (2.5 MG/3ML) 0.083% IN NEBU
2.5000 mg | INHALATION_SOLUTION | Freq: Four times a day (QID) | RESPIRATORY_TRACT | Status: DC | PRN
  Filled 2022-07-21: qty 3

## 2022-07-21 MED ORDER — PHENYLEPHRINE 80 MCG/ML (10ML) SYRINGE FOR IV PUSH (FOR BLOOD PRESSURE SUPPORT)
PREFILLED_SYRINGE | INTRAVENOUS | Status: DC | PRN
  Administered 2022-07-21: 40 ug via INTRAVENOUS
  Administered 2022-07-21 (×2): 80 ug via INTRAVENOUS
  Administered 2022-07-21: 120 ug via INTRAVENOUS
  Administered 2022-07-21 (×2): 80 ug via INTRAVENOUS

## 2022-07-21 MED ORDER — METHOCARBAMOL 1000 MG/10ML IJ SOLN: 500.0000 mg | Freq: Four times a day (QID) | INTRAVENOUS | Status: DC | PRN

## 2022-07-21 MED ORDER — MIDAZOLAM HCL 2 MG/2ML IJ SOLN
INTRAMUSCULAR | Status: AC
  Filled 2022-07-21: qty 2

## 2022-07-21 MED ORDER — PROPOFOL 500 MG/50ML IV EMUL
INTRAVENOUS | Status: DC | PRN
  Administered 2022-07-21: 15 ug/kg/min via INTRAVENOUS

## 2022-07-21 MED ORDER — HYDROXYCHLOROQUINE SULFATE 200 MG PO TABS
200.0000 mg | ORAL_TABLET | Freq: Every day | ORAL | Status: DC
  Administered 2022-07-21 – 2022-07-24 (×4): 200 mg via ORAL
  Filled 2022-07-21 (×4): qty 1

## 2022-07-21 MED ORDER — OXYCODONE-ACETAMINOPHEN 5-325 MG PO TABS: 1.0000 | ORAL_TABLET | Freq: Four times a day (QID) | ORAL | 0 refills | Status: DC | PRN

## 2022-07-21 MED ORDER — METOCLOPRAMIDE HCL 5 MG/ML IJ SOLN: 5.0000 mg | Freq: Three times a day (TID) | INTRAMUSCULAR | Status: DC | PRN

## 2022-07-21 MED ORDER — PROPOFOL 10 MG/ML IV BOLUS
INTRAVENOUS | Status: AC
  Filled 2022-07-21: qty 20

## 2022-07-21 MED ORDER — TRANEXAMIC ACID-NACL 1000-0.7 MG/100ML-% IV SOLN
INTRAVENOUS | Status: DC | PRN
  Administered 2022-07-21: 1000 mg via INTRAVENOUS

## 2022-07-21 MED ORDER — ASPIRIN 325 MG PO TBEC
325.0000 mg | DELAYED_RELEASE_TABLET | Freq: Every day | ORAL | Status: DC
  Administered 2022-07-22 – 2022-07-24 (×3): 325 mg via ORAL
  Filled 2022-07-21 (×3): qty 1

## 2022-07-21 MED ORDER — SODIUM CHLORIDE 0.9 % IV SOLN: INTRAVENOUS | Status: DC

## 2022-07-21 MED ORDER — ONDANSETRON HCL 4 MG PO TABS
4.0000 mg | ORAL_TABLET | Freq: Four times a day (QID) | ORAL | Status: DC | PRN
  Administered 2022-07-24: 4 mg via ORAL
  Filled 2022-07-21: qty 1

## 2022-07-21 MED ORDER — ACETAMINOPHEN 325 MG PO TABS
325.0000 mg | ORAL_TABLET | Freq: Four times a day (QID) | ORAL | Status: DC | PRN
  Administered 2022-07-21 – 2022-07-23 (×3): 650 mg via ORAL
  Filled 2022-07-21 (×3): qty 2

## 2022-07-21 MED ORDER — ONDANSETRON HCL 4 MG/2ML IJ SOLN
INTRAMUSCULAR | Status: DC | PRN
  Administered 2022-07-21: 4 mg via INTRAVENOUS

## 2022-07-21 MED ORDER — PRAVASTATIN SODIUM 40 MG PO TABS
40.0000 mg | ORAL_TABLET | Freq: Every day | ORAL | Status: DC
  Administered 2022-07-22 – 2022-07-24 (×3): 40 mg via ORAL
  Filled 2022-07-21 (×3): qty 1

## 2022-07-21 MED ORDER — OXYCODONE HCL 5 MG PO TABS
5.0000 mg | ORAL_TABLET | ORAL | Status: DC | PRN
  Administered 2022-07-21 – 2022-07-24 (×9): 5 mg via ORAL
  Filled 2022-07-21 (×11): qty 1

## 2022-07-21 MED ORDER — DEXAMETHASONE SODIUM PHOSPHATE 10 MG/ML IJ SOLN
INTRAMUSCULAR | Status: DC | PRN
  Administered 2022-07-21: 5 mg

## 2022-07-21 MED ORDER — POLYETHYLENE GLYCOL 3350 17 G PO PACK: 17.0000 g | PACK | Freq: Every day | ORAL | Status: DC | PRN

## 2022-07-21 MED ORDER — ASPIRIN 325 MG PO TBEC: 325.0000 mg | DELAYED_RELEASE_TABLET | Freq: Every day | ORAL | 0 refills | Status: DC

## 2022-07-21 MED ORDER — FAMOTIDINE 20 MG PO TABS
40.0000 mg | ORAL_TABLET | Freq: Every day | ORAL | Status: DC
  Administered 2022-07-22 – 2022-07-24 (×3): 40 mg via ORAL
  Filled 2022-07-21 (×2): qty 2

## 2022-07-21 MED ORDER — AMISULPRIDE (ANTIEMETIC) 5 MG/2ML IV SOLN: 10.0000 mg | Freq: Once | INTRAVENOUS | Status: DC | PRN

## 2022-07-21 MED ORDER — MONTELUKAST SODIUM 10 MG PO TABS
10.0000 mg | ORAL_TABLET | Freq: Every day | ORAL | Status: DC
  Administered 2022-07-21 – 2022-07-23 (×3): 10 mg via ORAL
  Filled 2022-07-21 (×3): qty 1

## 2022-07-21 MED ORDER — ACETAMINOPHEN 500 MG PO TABS
1000.0000 mg | ORAL_TABLET | Freq: Once | ORAL | Status: DC
  Filled 2022-07-21: qty 2

## 2022-07-21 MED ORDER — METHOCARBAMOL 500 MG PO TABS: 500.0000 mg | ORAL_TABLET | Freq: Four times a day (QID) | ORAL | 0 refills | Status: DC | PRN

## 2022-07-21 MED ORDER — MENTHOL 3 MG MT LOZG: 1.0000 | LOZENGE | OROMUCOSAL | Status: DC | PRN

## 2022-07-21 MED ORDER — BUPIVACAINE LIPOSOME 1.3 % IJ SUSP
INTRAMUSCULAR | Status: DC | PRN
  Administered 2022-07-21: 20 mL

## 2022-07-21 MED ORDER — BUPIVACAINE LIPOSOME 1.3 % IJ SUSP: 20.0000 mL | Freq: Once | INTRAMUSCULAR | Status: DC

## 2022-07-21 MED ORDER — LIDOCAINE 2% (20 MG/ML) 5 ML SYRINGE
INTRAMUSCULAR | Status: DC | PRN
  Administered 2022-07-21: 1020 mg via INTRAVENOUS
  Administered 2022-07-21 (×2): 10 mg via INTRAVENOUS

## 2022-07-21 MED ORDER — PROMETHAZINE HCL 25 MG/ML IJ SOLN: 6.2500 mg | INTRAMUSCULAR | Status: DC | PRN

## 2022-07-21 MED ORDER — PROPOFOL 10 MG/ML IV BOLUS
INTRAVENOUS | Status: DC | PRN
  Administered 2022-07-21: 20 mg via INTRAVENOUS

## 2022-07-21 MED ORDER — PHENOL 1.4 % MT LIQD: 1.0000 | OROMUCOSAL | Status: DC | PRN

## 2022-07-21 MED ORDER — BUPIVACAINE HCL (PF) 0.25 % IJ SOLN
INTRAMUSCULAR | Status: DC | PRN
  Administered 2022-07-21: 20 mL

## 2022-07-21 MED ORDER — METHOCARBAMOL 500 MG PO TABS
500.0000 mg | ORAL_TABLET | Freq: Four times a day (QID) | ORAL | Status: DC | PRN
  Administered 2022-07-21 – 2022-07-24 (×5): 500 mg via ORAL
  Filled 2022-07-21 (×5): qty 1

## 2022-07-21 SURGICAL SUPPLY — 78 items
ATTUNE MED DOME PAT 38 KNEE (Knees) IMPLANT
ATTUNE PS FEM LT SZ 5 CEM KNEE (Femur) IMPLANT
ATTUNE PSRP INSR SZ 5 10M KNEE (Insert) IMPLANT
BAG COUNTER SPONGE SURGICOUNT (BAG) ×3 IMPLANT
BAG SPNG CNTER NS LX DISP (BAG) ×2
BANDAGE ESMARK 6X9 LF (GAUZE/BANDAGES/DRESSINGS) ×3 IMPLANT
BASE TIBIAL ROT PLAT SZ 5 KNEE (Knees) IMPLANT
BLADE SAGITTAL 25.0X1.19X90 (BLADE) ×3 IMPLANT
BLADE SAW SGTL 13X75X1.27 (BLADE) ×3 IMPLANT
BNDG ADH 1X3 SHEER STRL LF (GAUZE/BANDAGES/DRESSINGS) IMPLANT
BNDG ADH THN 3X1 STRL LF (GAUZE/BANDAGES/DRESSINGS) ×2
BNDG CMPR 9X6 STRL LF SNTH (GAUZE/BANDAGES/DRESSINGS) ×2
BNDG CMPR MED 10X6 ELC LF (GAUZE/BANDAGES/DRESSINGS) ×2
BNDG CMPR MED 15X6 ELC VLCR LF (GAUZE/BANDAGES/DRESSINGS) ×2
BNDG CMPR STD VLCR NS LF 5.8X4 (GAUZE/BANDAGES/DRESSINGS) ×2
BNDG ELASTIC 4X5.8 VLCR NS LF (GAUZE/BANDAGES/DRESSINGS) ×3 IMPLANT
BNDG ELASTIC 4X5.8 VLCR STR LF (GAUZE/BANDAGES/DRESSINGS) ×3 IMPLANT
BNDG ELASTIC 6X10 VLCR STRL LF (GAUZE/BANDAGES/DRESSINGS) ×3 IMPLANT
BNDG ELASTIC 6X15 VLCR STRL LF (GAUZE/BANDAGES/DRESSINGS) IMPLANT
BNDG ESMARK 6X9 LF (GAUZE/BANDAGES/DRESSINGS) ×2
BOWL SMART MIX CTS (DISPOSABLE) ×3 IMPLANT
BSPLAT TIB 5 CMNT ROT PLAT STR (Knees) ×2 IMPLANT
CEMENT HV SMART SET (Cement) ×6 IMPLANT
COVER SURGICAL LIGHT HANDLE (MISCELLANEOUS) ×3 IMPLANT
CUFF TOURN SGL QUICK 34 (TOURNIQUET CUFF) ×2
CUFF TOURN SGL QUICK 42 (TOURNIQUET CUFF) IMPLANT
CUFF TRNQT CYL 34X4.125X (TOURNIQUET CUFF) ×3 IMPLANT
DRAPE ORTHO SPLIT 77X108 STRL (DRAPES) ×4
DRAPE SURG ORHT 6 SPLT 77X108 (DRAPES) ×6 IMPLANT
DRAPE U-SHAPE 47X51 STRL (DRAPES) ×3 IMPLANT
DRSG MEPILEX POST OP 4X12 (GAUZE/BANDAGES/DRESSINGS) IMPLANT
DURAPREP 26ML APPLICATOR (WOUND CARE) ×6 IMPLANT
ELECT REM PT RETURN 9FT ADLT (ELECTROSURGICAL) ×2
ELECTRODE REM PT RTRN 9FT ADLT (ELECTROSURGICAL) ×3 IMPLANT
FACESHIELD WRAPAROUND (MASK) ×4 IMPLANT
FACESHIELD WRAPAROUND OR TEAM (MASK) ×6 IMPLANT
GAUZE PAD ABD 8X10 STRL (GAUZE/BANDAGES/DRESSINGS) ×3 IMPLANT
GAUZE XEROFORM 5X9 LF (GAUZE/BANDAGES/DRESSINGS) ×3 IMPLANT
GLOVE BIOGEL PI IND STRL 8 (GLOVE) ×6 IMPLANT
GLOVE ORTHO TXT STRL SZ7.5 (GLOVE) ×6 IMPLANT
GOWN STRL REUS W/ TWL LRG LVL3 (GOWN DISPOSABLE) ×3 IMPLANT
GOWN STRL REUS W/ TWL XL LVL3 (GOWN DISPOSABLE) ×3 IMPLANT
GOWN STRL REUS W/TWL 2XL LVL3 (GOWN DISPOSABLE) ×3 IMPLANT
GOWN STRL REUS W/TWL LRG LVL3 (GOWN DISPOSABLE) ×2
GOWN STRL REUS W/TWL XL LVL3 (GOWN DISPOSABLE) ×2
HANDPIECE INTERPULSE COAX TIP (DISPOSABLE) ×2
IMMOBILIZER KNEE 20 (SOFTGOODS) ×2
IMMOBILIZER KNEE 20 THIGH 36 (SOFTGOODS) IMPLANT
IMMOBILIZER KNEE 22 UNIV (SOFTGOODS) ×3 IMPLANT
IV NS IRRIG 3000ML ARTHROMATIC (IV SOLUTION) IMPLANT
KIT BASIN OR (CUSTOM PROCEDURE TRAY) ×3 IMPLANT
KIT TURNOVER KIT B (KITS) ×3 IMPLANT
MANIFOLD NEPTUNE II (INSTRUMENTS) ×3 IMPLANT
MARKER SKIN DUAL TIP RULER LAB (MISCELLANEOUS) ×3 IMPLANT
NDL 18GX1X1/2 (RX/OR ONLY) (NEEDLE) ×3 IMPLANT
NDL HYPO 25GX1X1/2 BEV (NEEDLE) ×3 IMPLANT
NEEDLE 18GX1X1/2 (RX/OR ONLY) (NEEDLE) ×2 IMPLANT
NEEDLE HYPO 25GX1X1/2 BEV (NEEDLE) ×2 IMPLANT
NS IRRIG 1000ML POUR BTL (IV SOLUTION) ×3 IMPLANT
PACK TOTAL JOINT (CUSTOM PROCEDURE TRAY) ×3 IMPLANT
PAD ARMBOARD 7.5X6 YLW CONV (MISCELLANEOUS) ×6 IMPLANT
PADDING CAST COTTON 6X4 STRL (CAST SUPPLIES) ×3 IMPLANT
SET HNDPC FAN SPRY TIP SCT (DISPOSABLE) ×3 IMPLANT
STAPLER VISISTAT 35W (STAPLE) IMPLANT
SUCTION FRAZIER HANDLE 10FR (MISCELLANEOUS) ×2
SUCTION TUBE FRAZIER 10FR DISP (MISCELLANEOUS) ×3 IMPLANT
SUT VIC AB 0 CT1 27 (SUTURE)
SUT VIC AB 0 CT1 27XBRD ANBCTR (SUTURE) ×3 IMPLANT
SUT VIC AB 1 CTX 36 (SUTURE) ×2
SUT VIC AB 1 CTX36XBRD ANBCTR (SUTURE) ×6 IMPLANT
SUT VIC AB 2-0 CT1 27 (SUTURE) ×6
SUT VIC AB 2-0 CT1 TAPERPNT 27 (SUTURE) ×6 IMPLANT
SYR 50ML LL SCALE MARK (SYRINGE) ×3 IMPLANT
SYR CONTROL 10ML LL (SYRINGE) ×3 IMPLANT
TIBIAL BASE ROT PLAT SZ 5 KNEE (Knees) ×2 IMPLANT
TOWEL GREEN STERILE (TOWEL DISPOSABLE) ×3 IMPLANT
TOWEL GREEN STERILE FF (TOWEL DISPOSABLE) ×3 IMPLANT
TRAY FOLEY W/BAG SLVR 14FR (SET/KITS/TRAYS/PACK) IMPLANT

## 2022-07-21 NOTE — Interval H&P Note (Signed)
History and Physical Interval Note:  07/21/2022 7:28 AM  Desiree [name]  has presented today for surgery, with the diagnosis of left knee osteoarthritis.  The various methods of treatment have been discussed with the patient and family. After consideration of risks, benefits and other options for treatment, the patient has consented to  Procedure(s): LEFT TOTAL KNEE ARTHROPLASTY (Left) as a surgical intervention.  The patient's history has been reviewed, patient examined, no change in status, stable for surgery.  I have reviewed the patient's chart and labs.  Questions were answered to the patient's satisfaction.     [name]

## 2022-07-21 NOTE — Transfer of Care (Signed)
Immediate Anesthesia Transfer of Care Note  Patient: Desiree [name]  Procedure(s) Performed: LEFT TOTAL KNEE ARTHROPLASTY (Left: Knee) INTRA-ARTICULAR INJECTION UNDER ANESTHESIA (Right: Knee)  Patient Location: PACU  Anesthesia Type:Regional and Spinal  Level of Consciousness: awake, alert , oriented and patient cooperative  Airway & Oxygen Therapy: Patient Spontanous Breathing  Post-op Assessment: Report given to RN and Post -op Vital signs reviewed and stable  Post vital signs: Reviewed and stable  Last Vitals:  Vitals Value Taken Time  BP 113/99 07/21/22 0950  Temp    Pulse    Resp 19 07/21/22 0949  SpO2    Vitals shown include unvalidated device data.  Last Pain:  Vitals:   07/21/22 0555  TempSrc:   PainSc: 7          Complications: No notable events documented.

## 2022-07-21 NOTE — Anesthesia Postprocedure Evaluation (Signed)
Anesthesia Post Note  Patient: Desiree [name]  Procedure(s) Performed: LEFT TOTAL KNEE ARTHROPLASTY (Left: Knee) INTRA-ARTICULAR INJECTION UNDER ANESTHESIA (Right: Knee)     Patient location during evaluation: PACU Anesthesia Type: Spinal Level of consciousness: awake and alert Pain management: pain level controlled Vital Signs Assessment: post-procedure vital signs reviewed and stable Respiratory status: spontaneous breathing and respiratory function stable Cardiovascular status: blood pressure returned to baseline and stable Postop Assessment: spinal receding Anesthetic complications: no   No notable events documented.  Last Vitals:  Vitals:   07/21/22 1020 07/21/22 1030  BP: 106/66 118/68  Pulse: 77 79  Resp: 16 15  Temp:    SpO2: 100% 100%    Last Pain:  Vitals:   07/21/22 1100  TempSrc:   PainSc: 0-No pain                 [name]

## 2022-07-21 NOTE — H&P (Signed)
TOTAL KNEE ADMISSION H&P  Patient is being admitted for left total knee arthroplasty.  Subjective:  Chief Complaint:left knee pain.   71 year old white female history of end-stage DJD left knee and pain comes in for prep evaluation.  States that the symptoms unchanged in previous visit and she is want to proceed with left total knee replacement as scheduled.  Today history physical performed.  Review of systems positive for exertional angina and exertional dyspnea.  Patient states that she has been using her albuterol inhaler more frequently.  She admits to having wheezes.  Last seen by pulmonologist April 28, 2022 for COPD exacerbation.  Last seen by her cardiologist July 10, 2022.  Physician did not request preop medical, cardiac or pulmonary clearances HPI: Desiree [name], 71 y.o. female, has a history of pain and functional disability in the left knee due to arthritis and has failed non-surgical conservative treatments for greater than 12 weeks to includeNSAID's and/or analgesics, corticosteriod injections, use of assistive devices, and activity modification.  Onset of symptoms was gradual, starting 10 years ago with gradually worsening course since that time. The patient noted no past surgery on the left knee(s).  Patient currently rates pain in the left knee(s) at 10 out of 10 with activity. Patient has night pain, worsening of pain with activity and weight bearing, pain that interferes with activities of daily living, pain with passive range of motion, crepitus, and joint swelling.  Patient has evidence of subchondral cysts, subchondral sclerosis, periarticular osteophytes, and joint space narrowing by imaging studies. . There is no active infection.  Patient Active Problem List   Diagnosis Date Noted   Hypertrophic cardiomyopathy ([place]) 07/10/2022   Unilateral primary osteoarthritis, left knee 07/09/2022   Insomnia secondary to chronic pain 12/27/2020   Insomnia due to other mental disorder  12/27/2020   Snoring 12/27/2020   Inadequate sleep hygiene 12/27/2020   S/P total right hip arthroplasty 12/18/2020   Arthritis of right hip 12/17/2020   Encounter for preoperative assessment 12/10/2020   Infection of nail bed of finger of right hand 09/15/2020   Pain in right finger(s) 08/31/2020   Ganglion of right wrist 08/31/2020   COPD mixed type ([place]) 12/26/2018   Healthcare maintenance 12/26/2018   History of fall 11/05/2018   Weight loss 11/05/2018   Weakness 11/05/2018   Gangrene of finger ([place]) 10/09/2018   Raynaud's phenomenon with gangrene ([place]) 10/09/2018   LVH (left ventricular hypertrophy) 10/09/2018   Vasospasm ([place]) 09/06/2018   Hypotension 08/08/2018   Leucocytosis 08/08/2018   Elevated troponin I level 08/08/2018   Nausea and vomiting 08/06/2018   Pneumonia 07/12/2018   COPD exacerbation ([place]) 07/11/2018   Primary osteoarthritis of both feet 12/21/2017   DDD (degenerative disc disease), lumbar 12/21/2017   Primary osteoarthritis of both knees 12/21/2017   History of bilateral carpal tunnel release 12/21/2017   DDD (degenerative disc disease), cervical 11/15/2017   Former smoker 11/15/2017   Bronchiectasis without complication ([place]) [id number]   Impingement syndrome of right shoulder 07/25/2017   Hx of fusion of cervical spine 07/25/2017   Cervical spinal stenosis 09/27/2016   Polypharmacy 06/23/2015   Hyperlipidemia 04/19/2015   Visit for preventive health examination 01/01/2015   Primary osteoarthritis involving multiple joints 01/01/2015   Bipolar affective disorder, currently depressed, moderate ([place])    Bipolar I disorder with mania ([place]) 08/17/2014   Hypertension 10/21/2013   Dizziness 03/25/2013   Medication withdrawal ([place]) 03/05/2013   Diabetes mellitus with renal manifestations, controlled ([place]) 11/10/2012  Alopecia areata 02/06/2012   Obesity (BMI 30-39.9) 02/06/2012   Renal insufficiency 07/16/2011   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     Exertional dyspnea 02/07/2011   Dyspnea on exertion 01/19/2011   DM (diabetes mellitus), type 2 ([place]) 04/22/2010   Carpal tunnel syndrome 02/02/2010   CONSTIPATION 02/02/2010   Primary osteoarthritis of both hands 02/02/2010   CATARACTS 04/13/2009   PAIN IN JOINT, ANKLE AND FOOT 09/16/2008   Eosinophilia 07/21/2008   AFFECTIVE DISORDER 04/21/2008   BACK PAIN, CHRONIC 02/12/2008   LEG PAIN 02/12/2008   ABNORMAL INVOLUNTARY MOVEMENTS 12/04/2007   POSTURAL LIGHTHEADEDNESS 11/04/2007   COLONIC POLYPS, HX OF 11/04/2007   Essential hypertension 06/17/2007   HYPERLIPIDEMIA 04/08/2007   GERD 04/08/2007   LOW BACK PAIN SYNDROME 04/08/2007   CHEST PAIN, RECURRENT 04/08/2007   Past Medical History:  Diagnosis Date   Acute gastritis without bleeding 08/08/2018   Anemia    Anxiety    Asthma    Bipolar disorder ([place])    CANDIDIASIS, ESOPHAGEAL 07/28/2009   Qualifier: Diagnosis of  By: [name] MD, [name]    Chronic kidney disease    CKD III   Complication of anesthesia    was told she stopped breathing for one of her finger surgeries   COPD (chronic obstructive pulmonary disease) ([place])    Depression    Diabetes mellitus without complication (HCC)    no meds   Dyspnea    At rest,and with activity   FH: colonic polyps    Fractured elbow    right    GERD (gastroesophageal reflux disease)    Headache    migraines   HH (hiatus hernia)    History of carpal tunnel syndrome    History of chest pain    History of transfusion of packed red blood cells    Hyperlipidemia    Hypertension    Mitral valve prolapse    Neuroleptic-induced tardive dyskinesia    Osteoarthritis of more than one site    Pneumonia    Seasonal allergies    Stroke [facility])     Past Surgical History:  Procedure Laterality Date   AMPUTATION Left 10/14/2018   Procedure: AMPUTATION LEFT LONG FINGER TIP;  Surgeon: Dayna Barker, MD;  Location: [facility];  Service: Plastics;  Laterality: Left;   ANTERIOR CERVICAL  DECOMP/DISCECTOMY FUSION  09/27/2016   C5-6 anterior cervical discectomy and fusion, allograft and plate/notes 09/27/2016   ANTERIOR CERVICAL DECOMP/DISCECTOMY FUSION N/A 09/27/2016   Procedure: C5-6 Anterior Cervical Discectomy and Fusion, Allograft and Plate;  Surgeon: [name], MD;  Location: [facility];  Service: Orthopedics;  Laterality: N/A;   Back Fusion  2002   BIOPSY  07/19/2018   Procedure: BIOPSY;  Surgeon: [name], MD;  Location: [facility] ENDOSCOPY;  Service: Endoscopy;;   BIOPSY  02/13/2019   Procedure: BIOPSY;  Surgeon: [name], MD;  Location: [facility] ENDOSCOPY;  Service: Endoscopy;;   CARPAL TUNNEL RELEASE  yates   left   COLONOSCOPY N/A 01/05/2014   Procedure: COLONOSCOPY;  Surgeon: [name], MD;  Location: [facility] ENDOSCOPY;  Service: Endoscopy;  Laterality: N/A;   ELBOW SURGERY     age 77   ENTEROSCOPY N/A 02/13/2019   Procedure: ENTEROSCOPY;  Surgeon: [name], MD;  Location: [facility] ENDOSCOPY;  Service: Endoscopy;  Laterality: N/A;   ESOPHAGOGASTRODUODENOSCOPY (EGD) WITH PROPOFOL N/A 07/19/2018   Procedure: ESOPHAGOGASTRODUODENOSCOPY (EGD) WITH PROPOFOL;  Surgeon: [name], MD;  Location: [facility];  Service: Endoscopy;  Laterality: N/A;   EXTERNAL  EAR SURGERY Left    EYE SURGERY     "removed white dots under eyelid"   FINGER SURGERY Left    Juvara osteomy     KNEE SURGERY     NOSE SURGERY     Rt. toe bunion     skin, shave biopsy  05/03/2016   Left occipital scalp, top of scalp   TONSILLECTOMY     TOTAL HIP ARTHROPLASTY Right 12/17/2020   TOTAL HIP ARTHROPLASTY Right 12/17/2020   Procedure: TOTAL HIP ARTHROPLASTY-DIRECT ANTERIOR;  Surgeon: [name], MD;  Location: [facility];  Service: Orthopedics;  Laterality: Right;  needs RNFA   UPPER EXTREMITY ANGIOGRAPHY Bilateral 10/11/2018   Procedure: UPPER EXTREMITY ANGIOGRAPHY;  Surgeon: [name], MD;  Location: [facility];  Service: Cardiovascular;  Laterality: Bilateral;    Current  Facility-Administered Medications  Medication Dose Route Frequency Provider Last Rate Last Admin   acetaminophen (TYLENOL) tablet 1,000 mg  1,000 mg Oral Once Duane Boston, MD       bupivacaine liposome (EXPAREL) 1.3 % injection 266 mg  20 mL Infiltration Once Benjiman Core M, PA-C       insulin aspart (novoLOG) injection 0-7 Units  0-7 Units Subcutaneous Q2H PRN Duane Boston, MD       lactated ringers infusion   Intravenous Continuous Santa Lighter, MD 10 mL/hr at 07/21/22 0602 New Bag at 07/21/22 0602   vancomycin (VANCOCIN) IVPB 1000 mg/200 mL premix  1,000 mg Intravenous On Call to OR Lanae Crumbly, PA-C       Allergies  Allergen Reactions   Prednisone Shortness Of Breath, Itching, Nausea And Vomiting and Palpitations   Solu-Medrol [Methylprednisolone] Anaphylaxis   Amoxicillin Hives and Rash    Has patient had a PCN reaction causing immediate rash, facial/tongue/throat swelling, SOB or lightheadedness with hypotension:Yes Has patient had a PCN reaction causing severe rash involving mucus membranes or skin necrosis: No Has patient had a PCN reaction that required hospitalization: No Has patient had a PCN reaction occurring within the last 10 years: No If all of the above answers are "NO", then may proceed with Cephalosporin use.    Codeine Hives, Itching and Rash    Tolerated oxycodone and morphine previously   Hydrocodone-Acetaminophen Hives, Itching and Rash    Tolerated oxycodone and morphine previously   Penicillins Hives, Itching and Rash    ALLERGIC REACTION TO ORAL AMOXICILLIN Has patient had a PCN reaction causing immediate rash, facial/tongue/throat swelling, SOB or lightheadedness with hypotension: Yes Has patient had a PCN reaction causing severe rash involving mucus membranes or skin necrosis: No Has patient had a PCN reaction that required hospitalization: No Has patient had a PCN reaction occurring within the last 10 years: No If all of the above answers are "NO",  then may proceed with Cephalosporin use.   Tape Other (See Comments)    sore    Social History   Tobacco Use   Smoking status: Former    Packs/day: 2.00    Years: 30.00    Total pack years: 60.00    Types: Cigarettes    Quit date: 10/23/2001    Years since quitting: 20.7    Passive exposure: Past   Smokeless tobacco: Never  Substance Use Topics   Alcohol use: No    Family History  Problem Relation Age of Onset   Diabetes Mother    Hypertension Mother    Heart attack Mother    Heart attack Father    Heart  disease Father    Throat cancer Brother    Diabetes type II Brother    Heart disease Brother    Lung cancer Brother    Lung cancer Paternal Uncle    Lung cancer Daughter    Breast cancer Cousin      Review of Systems  Constitutional:  Positive for activity change.  HENT: Negative.    Respiratory:  Positive for shortness of breath and wheezing.   Cardiovascular:  Positive for chest pain.  Gastrointestinal: Negative.   Genitourinary: Negative.   Musculoskeletal:  Positive for gait problem and joint swelling.    Objective:  Physical Exam HENT:     Head: Normocephalic and atraumatic.     Nose: Nose normal.  Eyes:     Extraocular Movements: Extraocular movements intact.  Cardiovascular:     Rate and Rhythm: Regular rhythm.     Heart sounds: No murmur heard. Pulmonary:     Effort: No respiratory distress.     Breath sounds: Wheezing present.  Neurological:     Mental Status: She is oriented to person, place, and time.     Vital signs in last 24 hours: Temp:  [98.6 F (37 C)] 98.6 F (37 C) (09/29 0547) Pulse Rate:  [92] 92 (09/29 0547) Resp:  [18] 18 (09/29 0547) BP: (140)/(68) 140/68 (09/29 0547) SpO2:  [97 %] 97 % (09/29 0547) Weight:  [75.3 kg] 75.3 kg (09/29 0547)  Labs:   Estimated body mass index is 30.36 kg/m as calculated from the following:   Height as of this encounter: 5\' 2"  (1.575 m).   Weight as of this encounter: 75.3  kg.   Imaging Review Plain radiographs demonstrate moderate degenerative joint disease of the left knee(s). The overall alignment ismild varus. The bone quality appears to be good for age and reported activity level.      Assessment/Plan:  End stage arthritis, left knee   The patient history, physical examination, clinical judgment of the provider and imaging studies are consistent with end stage degenerative joint disease of the left knee(s) and total knee arthroplasty is deemed medically necessary. The treatment options including medical management, injection therapy arthroscopy and arthroplasty were discussed at length. The risks and benefits of total knee arthroplasty were presented and reviewed. The risks due to aseptic loosening, infection, stiffness, patella tracking problems, thromboembolic complications and other imponderables were discussed. The patient acknowledged the explanation, agreed to proceed with the plan and consent was signed. Patient is being admitted for inpatient treatment for surgery, pain control, PT, OT, prophylactic antibiotics, VTE prophylaxis, progressive ambulation and ADL's and discharge planning. The patient is planning to be discharged home with home health services     Patient's anticipated LOS is less than 2 midnights, meeting these requirements: - Younger than 29 - Lives within 1 hour of care - Has a competent adult at home to recover with post-op recover - NO history of  - Chronic pain requiring opiods  - Diabetes  - Coronary Artery Disease  - Heart failure  - Heart attack  - Stroke  - DVT/VTE  - Cardiac arrhythmia  - Respiratory Failure/COPD  - Renal failure  - Anemia  - Advanced Liver disease

## 2022-07-21 NOTE — Anesthesia Procedure Notes (Addendum)
Spinal  Patient location during procedure: OR Start time: 07/21/2022 7:29 AM End time: 07/21/2022 7:39 AM Reason for block: surgical anesthesia Staffing Performed: anesthesiologist  Anesthesiologist: Duane Boston, MD Performed by: Duane Boston, MD Authorized by: Duane Boston, MD   Preanesthetic Checklist Completed: patient identified, IV checked, risks and benefits discussed, surgical consent, monitors and equipment checked, pre-op evaluation and timeout performed Spinal Block Patient position: sitting Prep: DuraPrep Patient monitoring: cardiac monitor, continuous pulse ox and blood pressure Approach: midline Location: L2-3 Injection technique: single-shot Needle Needle type: Pencan  Needle gauge: 24 G Needle length: 9 cm Assessment Events: CSF return Additional Notes Functioning IV was confirmed and monitors were applied. Sterile prep and drape, including hand hygiene and sterile gloves were used. The patient was positioned and the spine was prepped. The skin was anesthetized with lidocaine.  Free flow of clear CSF was obtained prior to injecting local anesthetic into the CSF.  The spinal needle aspirated freely following injection.  The needle was carefully withdrawn.  The patient tolerated the procedure well.

## 2022-07-21 NOTE — Evaluation (Signed)
Physical Therapy Evaluation Patient Details Name: Desiree [name] MRN: [id number] DOB: 19-Jun-1951 Today's Date: 07/21/2022  History of Present Illness  Pt is a 71 y/o female s/p L TKA. PMH includes bipolar, CKD, COPD, HTN, CVA, and R THA.  Clinical Impression  Pt admitted secondary to problem above with deficits below. Pt requiring Min guard A to stand and transfer to chair in PACU this session. Reviewed knee precautions with pt. Reports family and friends can assist at d/c. Will continue to follow acutely.        Recommendations for follow up therapy are one component of a multi-disciplinary discharge planning process, led by the attending physician.  Recommendations may be updated based on patient status, additional functional criteria and insurance authorization.  Follow Up Recommendations Follow physician's recommendations for discharge plan and follow up therapies      Assistance Recommended at Discharge Intermittent Supervision/Assistance  Patient can return home with the following  Assistance with cooking/housework;Assist for transportation;Help with stairs or ramp for entrance    Equipment Recommendations None recommended by PT  Recommendations for Other Services       Functional Status Assessment Patient has had a recent decline in their functional status and demonstrates the ability to make significant improvements in function in a reasonable and predictable amount of time.     Precautions / Restrictions Precautions Precautions: Knee Precaution Booklet Issued: No Precaution Comments: Verbally reviewed knee precautions. Required Braces or Orthoses: Knee Immobilizer - Left Restrictions Weight Bearing Restrictions: Yes LLE Weight Bearing: Weight bearing as tolerated      Mobility  Bed Mobility Overal bed mobility: Needs Assistance Bed Mobility: Supine to Sit     Supine to sit: Min assist     General bed mobility comments: Assist for LLE assist.     Transfers Overall transfer level: Needs assistance Equipment used: Rolling walker (2 wheels) Transfers: Bed to chair/wheelchair/BSC, Sit to/from Stand Sit to Stand: Min guard Stand pivot transfers: Min guard         General transfer comment: Min guard A for safety to stand and take steps to chair in PACU. Cues for sequencing.    Ambulation/Gait                  Stairs            Wheelchair Mobility    Modified Rankin (Stroke Patients Only)       Balance Overall balance assessment: Needs assistance Sitting-balance support: No upper extremity supported, Feet supported Sitting balance-Leahy Scale: Fair     Standing balance support: Bilateral upper extremity supported Standing balance-Leahy Scale: Poor Standing balance comment: Reliant on UE support                             Pertinent Vitals/Pain Pain Assessment Pain Assessment: Faces Faces Pain Scale: Hurts little more Pain Location: L knee Pain Descriptors / Indicators: Guarding, Grimacing Pain Intervention(s): Limited activity within patient's tolerance, Monitored during session, Repositioned    Home Living Family/patient expects to be discharged to:: Private residence Living Arrangements: Alone Available Help at Discharge: Family;Available 24 hours/day Type of Home: Apartment Home Access: Stairs to enter Entrance Stairs-Rails: None Entrance Stairs-Number of Steps: 1 curb step   Home Layout: One level Home Equipment: Conservation officer, nature (2 wheels);BSC/3in1      Prior Function Prior Level of Function : Independent/Modified Independent             Mobility Comments: using cane  for ambulation       Hand Dominance        Extremity/Trunk Assessment   Upper Extremity Assessment Upper Extremity Assessment: Overall WFL for tasks assessed    Lower Extremity Assessment Lower Extremity Assessment: LLE deficits/detail LLE Deficits / Details: Deficits consistent with post op  pain and weakness.    Cervical / Trunk Assessment Cervical / Trunk Assessment: Normal  Communication   Communication: No difficulties  Cognition Arousal/Alertness: Awake/alert Behavior During Therapy: WFL for tasks assessed/performed Overall Cognitive Status: Within Functional Limits for tasks assessed                                          General Comments      Exercises Total Joint Exercises Ankle Circles/Pumps: AROM, Both, 20 reps   Assessment/Plan    PT Assessment Patient needs continued PT services  PT Problem List Decreased range of motion;Decreased strength;Decreased balance;Decreased mobility;Pain       PT Treatment Interventions DME instruction;Gait training;Stair training;Functional mobility training;Therapeutic activities;Therapeutic exercise;Balance training;Patient/family education    PT Goals (Current goals can be found in the Care Plan section)  Acute Rehab PT Goals Patient Stated Goal: to go home PT Goal Formulation: With patient Time For Goal Achievement: 08/04/22 Potential to Achieve Goals: Good    Frequency 7X/week     Co-evaluation               AM-PAC PT "6 Clicks" Mobility  Outcome Measure Help needed turning from your back to your side while in a flat bed without using bedrails?: A Little Help needed moving from lying on your back to sitting on the side of a flat bed without using bedrails?: A Little Help needed moving to and from a bed to a chair (including a wheelchair)?: A Little Help needed standing up from a chair using your arms (e.g., wheelchair or bedside chair)?: A Little Help needed to walk in hospital room?: A Little Help needed climbing 3-5 steps with a railing? : A Lot 6 Click Score: 17    End of Session Equipment Utilized During Treatment: Gait belt;Left knee immobilizer Activity Tolerance: Patient tolerated treatment well Patient left: in chair;with nursing/sitter in room (in recliner in PACU) Nurse  Communication: Mobility status PT Visit Diagnosis: Other abnormalities of gait and mobility (R26.89);Pain Pain - Right/Left: Left Pain - part of body: Knee    Time: 0962-8366 PT Time Calculation (min) (ACUTE ONLY): 16 min   Charges:   PT Evaluation $PT Eval Low Complexity: 1 Low          Cindee Salt, DPT  Acute Rehabilitation Services  Office: [phone]   Lehman Prom 07/21/2022, 4:26 PM

## 2022-07-21 NOTE — Op Note (Signed)
Preop diagnosis: Left knee primary osteoarthritis  Postop diagnosis same  Procedure: Left total knee arthroplasty.  Right knee intra-articular cortisone injection at end of case.  Surgeon: Rodell Perna, MD   Assistant: Benjiman Core, PA-C medically necessary and present for cementing of the prosthesis, retraction and closure.    Anesthesia: Preoperative abductor block plus spinal anesthesia plus Exparel and Marcaine 20 +20 cc  EBL: 100 cc   Implants  CEMENT HV SMART SET - [id number]  Inventory Item: CEMENT HV SMART SET Serial no.:  Model/Cat no.: [id number]  Implant name: CEMENT HV SMART SET - [id number] Laterality: Left Area: Knee  Manufacturer: [place] Date of Manufacture:    Action: Implanted Number Used: 1   Device Identifier:  Device Identifier Type:     CEMENT HV SMART SET - [id number]  Inventory Item: CEMENT HV SMART SET Serial no.:  Model/Cat no.: [id number]  Implant name: CEMENT HV SMART SET - [id number] Laterality: Left Area: Knee  Manufacturer: [place] Date of Manufacture:    Action: Implanted Number Used: 1   Device Identifier:  Device Identifier Type:     TIBIAL BASE ROT PLAT SZ 5 KNEE - [id number]  Inventory Item: TIBIAL BASE ROT PLAT SZ 5 KNEE Serial no.:  Model/Cat no.: [id number]  Implant name: TIBIAL BASE ROT PLAT SZ 5 KNEE - [id number] Laterality: Left Area: Knee  Manufacturer: [employer] Date of Manufacture:    Action: Implanted Number Used: 1   Device Identifier:  Device Identifier Type:     ATTUNE MED DOME PAT 38 KNEE - [id number]  Inventory Item: ATTUNE MED DOME PAT 38 KNEE Serial no.:  Model/Cat no.: [id number]  Implant name: ATTUNE MED DOME PAT 92 KNEE - [id number] Laterality: Left Area: Knee  Manufacturer: [place] Date of Manufacture:    Action: Implanted Number Used: 1   Device Identifier:  Device Identifier Type:     ATTUNE PSRP INSR SZ 5 84M KNEE - [id number]  Inventory Item: ATTUNE PSRP INSR SZ 5 84M KNEE  Serial no.:  Model/Cat no.: [id number]  Implant name: ATTUNE PSRP INSR SZ 5 84M KNEE - [id number] Laterality: Left Area: Knee  Manufacturer: [place] Date of Manufacture:    Action: Implanted Number Used: 1   Device Identifier:  Device Identifier Type:     ATTUNE PS FEM LT SZ 5 CEM KNEE - [id number]  Inventory Item: ATTUNE PS FEM LT SZ 5 CEM KNEE Serial no.:  Model/Cat no.: [id number]  Implant name: ATTUNE PS FEM LT SZ 5 CEM KNEE - [id number] Laterality: Left Area: Knee  Manufacturer: [employer] Date of Manufacture:    Action: Implanted Number Used: 1   Tourniquet: 300 mmHg x 57 minutes.  Procedure: After induction of spinal anesthesia proximal thigh tourniquet heel bump lateral post prepping with DuraPrep the tip of the toes usual total knee sheets draped impervious stockinette, Coban, sterile skin marker Betadine Steri-Drape was applied.  Leg was wrapped in Esmarch after timeout procedure.  IV TXA and 2 g Ancef was given prophylactic.  Midline incision was made patella was everted 10 mm resected.  Intramedullary hole drilled in the femur and 10 mm taken off the distal femur.  Tibial cut was 12 mm.  Meniscus remnants were resected.  Trial sizers showed 8-10 gave full extension.  Chamfer cuts box cut keel preparation of the tibia was performed and ligaments filled on the femur as well as for the 3 peg patella.  Trials were inserted 10 mm spacer gave full extension good collateral  balance good flexion-extension balance.  Pulsatile lavage back and mixing of the cement and then cementing of the tibia followed by femur placement of the rotating platform 10 mm.  Patella was held with the self-retaining clamp cement was hardened 15 minutes.  Exparel Marcaine was injected.  Tourniquet plate deflated hemostasis obtained standard layered closure #1 undyed Vicryl in the capsule 2-0 Vicryl subtenons tissue skin staple closure postop dressing.  Once dressing was applied to the left knee right knee  was prepped and injection of 4 cc Marcaine 1 cc cortisone Depo-Medrol was injected into the right knee as patient has requested.  Patient tolerated the procedure well transferred covering stable condition.

## 2022-07-21 NOTE — Anesthesia Procedure Notes (Signed)
Anesthesia Regional Block: Adductor canal block   Pre-Anesthetic Checklist: , timeout performed,  Correct Patient, Correct Site, Correct Laterality,  Correct Procedure, Correct Position, site marked,  Risks and benefits discussed,  Surgical consent,  Pre-op evaluation,  At surgeon's request and post-op pain management  Laterality: Left  Prep: chloraprep       Needles:  Injection technique: Single-shot  Needle Type: Stimulator Needle - 80     Needle Length: 10cm  Needle Gauge: 21     Additional Needles:   Narrative:  Start time: 07/21/2022 7:02 AM End time: 07/21/2022 7:12 AM Injection made incrementally with aspirations every 5 mL.  Performed by: Personally  Anesthesiologist: Duane Boston, MD

## 2022-07-21 NOTE — Progress Notes (Signed)
Orthopedic Tech Progress Note Patient Details:  Desiree [name] 04-12-1951 [id number]  CPM Left Knee CPM Left Knee: On Left Knee Flexion (Degrees): 90 Left Knee Extension (Degrees): 0  Post Interventions Patient Tolerated: Well Instructions Provided: Adjustment of device  Desiree [name] 07/21/2022, 10:45 AM

## 2022-07-21 NOTE — Discharge Instructions (Signed)

## 2022-07-22 DIAGNOSIS — E785 Hyperlipidemia, unspecified: Secondary | ICD-10-CM | POA: Diagnosis not present

## 2022-07-22 DIAGNOSIS — Z96641 Presence of right artificial hip joint: Secondary | ICD-10-CM | POA: Diagnosis not present

## 2022-07-22 DIAGNOSIS — J449 Chronic obstructive pulmonary disease, unspecified: Secondary | ICD-10-CM

## 2022-07-22 DIAGNOSIS — M1990 Unspecified osteoarthritis, unspecified site: Secondary | ICD-10-CM | POA: Diagnosis not present

## 2022-07-22 DIAGNOSIS — Z8673 Personal history of transient ischemic attack (TIA), and cerebral infarction without residual deficits: Secondary | ICD-10-CM | POA: Diagnosis not present

## 2022-07-22 DIAGNOSIS — F319 Bipolar disorder, unspecified: Secondary | ICD-10-CM

## 2022-07-22 DIAGNOSIS — I10 Essential (primary) hypertension: Secondary | ICD-10-CM

## 2022-07-22 DIAGNOSIS — I129 Hypertensive chronic kidney disease with stage 1 through stage 4 chronic kidney disease, or unspecified chronic kidney disease: Secondary | ICD-10-CM | POA: Diagnosis not present

## 2022-07-22 DIAGNOSIS — M1712 Unilateral primary osteoarthritis, left knee: Secondary | ICD-10-CM | POA: Diagnosis not present

## 2022-07-22 DIAGNOSIS — Z79899 Other long term (current) drug therapy: Secondary | ICD-10-CM | POA: Diagnosis not present

## 2022-07-22 DIAGNOSIS — E1122 Type 2 diabetes mellitus with diabetic chronic kidney disease: Secondary | ICD-10-CM | POA: Diagnosis not present

## 2022-07-22 DIAGNOSIS — N183 Chronic kidney disease, stage 3 unspecified: Secondary | ICD-10-CM | POA: Diagnosis not present

## 2022-07-22 DIAGNOSIS — E1169 Type 2 diabetes mellitus with other specified complication: Secondary | ICD-10-CM

## 2022-07-22 DIAGNOSIS — J45909 Unspecified asthma, uncomplicated: Secondary | ICD-10-CM | POA: Diagnosis not present

## 2022-07-22 DIAGNOSIS — J441 Chronic obstructive pulmonary disease with (acute) exacerbation: Secondary | ICD-10-CM | POA: Diagnosis not present

## 2022-07-22 LAB — BASIC METABOLIC PANEL
Anion gap: 8 (ref 5–15)
BUN: 25 mg/dL — ABNORMAL HIGH (ref 8–23)
CO2: 24 mmol/L (ref 22–32)
Calcium: 8.9 mg/dL (ref 8.9–10.3)
Chloride: 110 mmol/L (ref 98–111)
Creatinine, Ser: 1.62 mg/dL — ABNORMAL HIGH (ref 0.44–1.00)
GFR, Estimated: 34 mL/min — ABNORMAL LOW (ref >60)
Glucose, Bld: 141 mg/dL — ABNORMAL HIGH (ref 70–99)
Potassium: 4.3 mmol/L (ref 3.5–5.1)
Sodium: 142 mmol/L (ref 135–145)

## 2022-07-22 LAB — CBC
HCT: 32.2 % — ABNORMAL LOW (ref 36.0–46.0)
Hemoglobin: 10.6 g/dL — ABNORMAL LOW (ref 12.0–15.0)
MCH: 30.1 pg (ref 26.0–34.0)
MCHC: 32.9 g/dL (ref 30.0–36.0)
MCV: 91.5 fL (ref 80.0–100.0)
Platelets: 216 10*3/uL (ref 150–400)
RBC: 3.52 MIL/uL — ABNORMAL LOW (ref 3.87–5.11)
RDW: 13.4 % (ref 11.5–15.5)
WBC: 12 10*3/uL — ABNORMAL HIGH (ref 4.0–10.5)
nRBC: 0 % (ref 0.0–0.2)

## 2022-07-22 LAB — GLUCOSE, CAPILLARY: Glucose-Capillary: 127 mg/dL — ABNORMAL HIGH (ref 70–99)

## 2022-07-22 NOTE — TOC Progression Note (Signed)
Transition of Care [facility]) - Progression Note    Patient Details  Name: Desiree [name] MRN: [id number] Date of Birth: [birth year]  Transition of Care [facility]) CM/SW Contact  Bartholomew Crews, RN Phone Number: [phone] 07/22/2022, 11:39 AM  Clinical Narrative:     Spoke with patient at the bedside to discuss post acute transition. Patient confirmed [place] arrangements per [facility]. Patient has needed DME. Patient will have transportation home at DC. No TOC needs identified at this time.   Expected Discharge Plan: [place] Barriers to Discharge: Continued Medical Work up  Expected Discharge Plan and Services Expected Discharge Plan: [place]                                               Social Determinants of Health (SDOH) Interventions Housing Interventions: Patient Refused  Readmission Risk Interventions     No data to display

## 2022-07-22 NOTE — Care Management Obs Status (Signed)
[place] NOTIFICATION   Patient Details  Name: KAMILLA HANDS MRN: [id number] Date of Birth: 1951-04-29   Medicare Observation Status Notification Given:  Yes    Bartholomew Crews, RN 07/22/2022, 11:38 AM

## 2022-07-22 NOTE — Progress Notes (Signed)
Physical Therapy Treatment Patient Details Name: Desiree [name] MRN: [id number] DOB: [birth year] Today's Date: 07/22/2022   History of Present Illness Pt is a 71 y/o female s/p L TKA. PMH includes bipolar, CKD, COPD, HTN, CVA, and R THA.    PT Comments    Pt received up in recliner on arrival for second session and agreeable. Pt continues to be limited by decreased activity tolerance, weakness, and pain. Pt able to complete gait in room with distance limited to pt stated tolerance secondary to pain and fatigue, with pt needing min assist to manage RW and to steady. Pt agreeable to CPM use at end of session. Pt continues to benefit from skilled PT services to progress toward functional mobility goals.    Recommendations for follow up therapy are one component of a multi-disciplinary discharge planning process, led by the attending physician.  Recommendations may be updated based on patient status, additional functional criteria and insurance authorization.  Follow Up Recommendations  Follow physician's recommendations for discharge plan and follow up therapies     Assistance Recommended at Discharge Intermittent Supervision/Assistance  Patient can return home with the following Assistance with cooking/housework;Assist for transportation;Help with stairs or ramp for entrance   Equipment Recommendations  None recommended by PT    Recommendations for Other Services       Precautions / Restrictions Precautions Precautions: Knee Precaution Booklet Issued: No Precaution Comments: Verbally reviewed knee precautions. Required Braces or Orthoses: Knee Immobilizer - Left Restrictions Weight Bearing Restrictions: Yes LLE Weight Bearing: Weight bearing as tolerated     Mobility  Bed Mobility Overal bed mobility: Needs Assistance Bed Mobility: Sit to Supine     Supine to sit: Min guard Sit to supine: Min guard   General bed mobility comments: pt able to self mobilize with gati belt as  leg lifter, no physical assist needed, pt using bedrails and needing increased time and cues for sequencing to complete    Transfers Overall transfer level: Needs assistance Equipment used: Rolling walker (2 wheels) Transfers: Sit to/from Stand Sit to Stand: Min guard           General transfer comment: Min guard A for safety to stand from recliner and BSC over commode    Ambulation/Gait Ambulation/Gait assistance: Mod assist Gait Distance (Feet): 30 Feet Assistive device: Rolling walker (2 wheels) Gait Pattern/deviations: Step-to pattern, Decreased stride length, Antalgic, Trunk flexed Gait velocity: slowed     General Gait Details: very slow antatlgic gait with RW, min a for sequencing and RW management, pt needing cues throughout to push up through UE to unweight painful LE, pt with no overt LOB however noted instability throughout, distance limited to increased fatigue   Stairs             Wheelchair Mobility    Modified Rankin (Stroke Patients Only)       Balance Overall balance assessment: Needs assistance Sitting-balance support: No upper extremity supported, Feet supported Sitting balance-Leahy Scale: Fair     Standing balance support: Bilateral upper extremity supported Standing balance-Leahy Scale: Poor Standing balance comment: Reliant on UE support                            Cognition Arousal/Alertness: Awake/alert Behavior During Therapy: WFL for tasks assessed/performed Overall Cognitive Status: Within Functional Limits for tasks assessed  Exercises Total Joint Exercises Ankle Circles/Pumps: AROM, Both, 10 reps Quad Sets: AROM, Left, 10 reps    General Comments        Pertinent Vitals/Pain Pain Assessment Pain Assessment: Faces Faces Pain Scale: Hurts even more Pain Location: L knee Pain Descriptors / Indicators: Guarding, Grimacing Pain Intervention(s):  Monitored during session, Limited activity within patient's tolerance, Repositioned    Home Living                          Prior Function            PT Goals (current goals can now be found in the care plan section) Acute Rehab PT Goals PT Goal Formulation: With patient Time For Goal Achievement: 08/04/22    Frequency    7X/week      PT Plan      Co-evaluation              AM-PAC PT "6 Clicks" Mobility   Outcome Measure  Help needed turning from your back to your side while in a flat bed without using bedrails?: A Little Help needed moving from lying on your back to sitting on the side of a flat bed without using bedrails?: A Little Help needed moving to and from a bed to a chair (including a wheelchair)?: A Little Help needed standing up from a chair using your arms (e.g., wheelchair or bedside chair)?: A Little Help needed to walk in hospital room?: A Little Help needed climbing 3-5 steps with a railing? : A Lot 6 Click Score: 17    End of Session Equipment Utilized During Treatment: Gait belt;Left knee immobilizer Activity Tolerance: Patient limited by pain Patient left: with call bell/phone within reach;in bed;Other (comment) (in CPM) Nurse Communication: Mobility status PT Visit Diagnosis: Other abnormalities of gait and mobility (R26.89);Pain Pain - Right/Left: Left Pain - part of body: Knee     Time: [id number] PT Time Calculation (min) (ACUTE ONLY): 24 min  Charges:  $Gait Training: 8-22 mins $Therapeutic Activity: 8-22 mins                     Setsuko Robins R. PTA Acute Rehabilitation Services Office: [place] 07/22/2022, 4:22 PM

## 2022-07-22 NOTE — TOC Progression Note (Signed)
Transition of Care [facility]) - Progression Note    Patient Details  Name: Desiree [name] MRN: [id number] Date of Birth: [birth year]  [facility]) CM/SW Contact  Bartholomew Crews, RN Phone Number: [phone] 07/22/2022, 8:34 AM  Clinical Narrative:     Per chart review, patient is a joint bundle. HH PT referral to [place] per outpatient CM, Sherri Thomas. Patient has all needed DME from previous orthopedic procedure per outpatient assessment. TOC following for transitional needs.   Expected Discharge Plan: [place] Barriers to Discharge: Continued Medical Work up  Expected Discharge Plan and Services Expected Discharge Plan: [place]                                               Social Determinants of Health (SDOH) Interventions Housing Interventions: Patient Refused  Readmission Risk Interventions     No data to display

## 2022-07-22 NOTE — Progress Notes (Signed)
  Subjective: Desiree [name] is a 71 y.o. female s/p left TKA.  They are POD 1.  Pt's pain is controlled but moderate.  Pt denies any complain of chest pain, shortness of breath, abdominal pain, calf pain consistently.  Have little bit of chest pain that sounds like reflux yesterday.  Had a small amount of periumbilical pain as well yesterday that has completely resolved..  Patient denies any fevers or chills.  She was able to ambulate about 20 to 30 feet with physical therapy earlier this morning.  She is comfortable in the chair watching TV.  Objective: Vital signs in last 24 hours: Temp:  [98 F (36.7 C)-98.9 F (37.2 C)] 98.9 F (37.2 C) (09/30 0746) Pulse Rate:  [78-105] 102 (09/30 0746) Resp:  [13-24] 17 (09/30 0746) BP: (108-144)/(68-85) 140/75 (09/30 0746) SpO2:  [90 %-100 %] 95 % (09/30 0746)  Intake/Output from previous day: 09/29 0701 - 09/30 0700 In: 600 [I.V.:500; IV Piggyback:100] Out: 1680 [Urine:1650; Blood:30] Intake/Output this shift: Total I/O In: 360 [P.O.:360] Out: -   Exam:  No gross blood or drainage overlying the dressing.  Ace wrap was removed in order to the dressing. Left foot is warm and well-perfused Sensation intact distally in the operative foot Able to dorsiflex and plantarflex the operative foot Minimal calf tenderness.  Negative Homans' sign. Not able to perform straight leg raise   Labs: Recent Labs    07/22/22 0300  HGB 10.6*   Recent Labs    07/22/22 0300  WBC 12.0*  RBC 3.52*  HCT 32.2*  PLT 216   Recent Labs    07/22/22 0300  NA 142  K 4.3  CL 110  CO2 24  BUN 25*  CREATININE 1.62*  GLUCOSE 141*  CALCIUM 8.9   No results for input(s): "LABPT", "INR" in the last 72 hours.  Assessment/Plan: Pt is POD 1 s/p TKA.    -Plan to discharge to home in coming days pending patient's pain and PT eval  -WBAT with a walker  -Follow-up with Dr. Lorin Mercy in clinic 2 weeks postoperatively    [facility] 07/22/2022, 11:52 AM

## 2022-07-22 NOTE — Progress Notes (Signed)
Physical Therapy Treatment Patient Details Name: Desiree [name] MRN: [id number] DOB: 04/01/1951 Today's Date: 07/22/2022   History of Present Illness Pt is a 71 y/o female s/p L TKA. PMH includes bipolar, CKD, COPD, HTN, CVA, and R THA.    PT Comments    Pt received supine and agreeable to session with steady progress towards acute goals, however pt limited by increased pain despite pre-medication. Pt able to complete bed mobility with min guard assist for safety using gait belt as leg lifter to self mobilize LLE to/off EOB. Pt needing cues throughout transfers for safe hand placement with poor carry through from start of session to end. Pt able to complete ambulation in room to BR and back with further distance deferred secondary to increased pain and pt c/o dizziness (BP stable). HEP handout provided to pt and reviewed. Plan to continue to progress gait and functional transfers in PM session for ultimate safe d/c to home. Pt continues to benefit from skilled PT services to progress toward functional mobility goals.    Recommendations for follow up therapy are one component of a multi-disciplinary discharge planning process, led by the attending physician.  Recommendations may be updated based on patient status, additional functional criteria and insurance authorization.  Follow Up Recommendations  Follow physician's recommendations for discharge plan and follow up therapies     Assistance Recommended at Discharge Intermittent Supervision/Assistance  Patient can return home with the following Assistance with cooking/housework;Assist for transportation;Help with stairs or ramp for entrance   Equipment Recommendations  None recommended by PT    Recommendations for Other Services       Precautions / Restrictions Precautions Precautions: Knee Precaution Booklet Issued: No Precaution Comments: Verbally reviewed knee precautions. Required Braces or Orthoses: Knee Immobilizer -  Left Restrictions Weight Bearing Restrictions: Yes LLE Weight Bearing: Weight bearing as tolerated     Mobility  Bed Mobility Overal bed mobility: Needs Assistance Bed Mobility: Supine to Sit     Supine to sit: Min guard     General bed mobility comments: pt able to self mobilize with gati belt as leg lifter, no physical assist needed, pt using bedrails and needing increased time to complete    Transfers Overall transfer level: Needs assistance Equipment used: Rolling walker (2 wheels) Transfers: Sit to/from Stand Sit to Stand: Min guard           General transfer comment: Min guard A for safety to stand from EOB abd BSC over commode    Ambulation/Gait Ambulation/Gait assistance: Mod assist Gait Distance (Feet): 30 Feet (15' x2) Assistive device: Rolling walker (2 wheels) Gait Pattern/deviations: Step-to pattern, Decreased stride length, Antalgic, Trunk flexed Gait velocity: slowed     General Gait Details: very slow antatlgic gait with RW, mod a for sequencing and RW management, pt needing cues throughout to push up through UE to unweight painful LE, pt with no overt LOB, pt with some c/o dizziness, however BP stable   Stairs             Wheelchair Mobility    Modified Rankin (Stroke Patients Only)       Balance Overall balance assessment: Needs assistance Sitting-balance support: No upper extremity supported, Feet supported Sitting balance-Leahy Scale: Fair     Standing balance support: Bilateral upper extremity supported Standing balance-Leahy Scale: Poor Standing balance comment: Reliant on UE support  Cognition Arousal/Alertness: Awake/alert Behavior During Therapy: WFL for tasks assessed/performed Overall Cognitive Status: Within Functional Limits for tasks assessed                                          Exercises Total Joint Exercises Ankle Circles/Pumps: AROM, Both, 10  reps Quad Sets: AROM, Left, 10 reps    General Comments        Pertinent Vitals/Pain Pain Assessment Pain Assessment: Faces Faces Pain Scale: Hurts whole lot Pain Location: L knee Pain Descriptors / Indicators: Guarding, Grimacing Pain Intervention(s): Monitored during session, Limited activity within patient's tolerance, Repositioned, Premedicated before session    Home Living                          Prior Function            PT Goals (current goals can now be found in the care plan section) Acute Rehab PT Goals Patient Stated Goal: to go to BR PT Goal Formulation: With patient Time For Goal Achievement: 08/04/22    Frequency    7X/week      PT Plan      Co-evaluation              AM-PAC PT "6 Clicks" Mobility   Outcome Measure  Help needed turning from your back to your side while in a flat bed without using bedrails?: A Little Help needed moving from lying on your back to sitting on the side of a flat bed without using bedrails?: A Little Help needed moving to and from a bed to a chair (including a wheelchair)?: A Little Help needed standing up from a chair using your arms (e.g., wheelchair or bedside chair)?: A Little Help needed to walk in hospital room?: A Lot Help needed climbing 3-5 steps with a railing? : A Lot 6 Click Score: 16    End of Session Equipment Utilized During Treatment: Gait belt;Left knee immobilizer Activity Tolerance: Patient limited by pain Patient left: in chair;with call bell/phone within reach Nurse Communication: Mobility status PT Visit Diagnosis: Other abnormalities of gait and mobility (R26.89);Pain Pain - Right/Left: Left Pain - part of body: Knee     Time: 1761-6073 PT Time Calculation (min) (ACUTE ONLY): 27 min  Charges:  $Gait Training: 8-22 mins $Therapeutic Activity: 8-22 mins                     Kirat Mezquita R. PTA Acute Rehabilitation Services Office: [place] 07/22/2022,  10:23 AM

## 2022-07-22 NOTE — Plan of Care (Signed)
  Problem: Education: Goal: Knowledge of the prescribed therapeutic regimen will improve Outcome: Progressing   Problem: Activity: Goal: Ability to avoid complications of mobility impairment will improve Outcome: Progressing   Problem: Clinical Measurements: Goal: Postoperative complications will be avoided or minimized Outcome: Progressing   Problem: Skin Integrity: Goal: Will show signs of wound healing Outcome: Progressing   Problem: Nutrition: Goal: Adequate nutrition will be maintained Outcome: Progressing   Problem: Coping: Goal: Level of anxiety will decrease Outcome: Progressing

## 2022-07-23 DIAGNOSIS — Z79899 Other long term (current) drug therapy: Secondary | ICD-10-CM | POA: Diagnosis not present

## 2022-07-23 DIAGNOSIS — M1712 Unilateral primary osteoarthritis, left knee: Secondary | ICD-10-CM | POA: Diagnosis not present

## 2022-07-23 DIAGNOSIS — I129 Hypertensive chronic kidney disease with stage 1 through stage 4 chronic kidney disease, or unspecified chronic kidney disease: Secondary | ICD-10-CM | POA: Diagnosis not present

## 2022-07-23 DIAGNOSIS — J441 Chronic obstructive pulmonary disease with (acute) exacerbation: Secondary | ICD-10-CM | POA: Diagnosis not present

## 2022-07-23 DIAGNOSIS — J45909 Unspecified asthma, uncomplicated: Secondary | ICD-10-CM | POA: Diagnosis not present

## 2022-07-23 DIAGNOSIS — N183 Chronic kidney disease, stage 3 unspecified: Secondary | ICD-10-CM | POA: Diagnosis not present

## 2022-07-23 DIAGNOSIS — Z96641 Presence of right artificial hip joint: Secondary | ICD-10-CM | POA: Diagnosis not present

## 2022-07-23 DIAGNOSIS — Z8673 Personal history of transient ischemic attack (TIA), and cerebral infarction without residual deficits: Secondary | ICD-10-CM | POA: Diagnosis not present

## 2022-07-23 DIAGNOSIS — E1122 Type 2 diabetes mellitus with diabetic chronic kidney disease: Secondary | ICD-10-CM | POA: Diagnosis not present

## 2022-07-23 LAB — CBC
HCT: 32.6 % — ABNORMAL LOW (ref 36.0–46.0)
Hemoglobin: 10.9 g/dL — ABNORMAL LOW (ref 12.0–15.0)
MCH: 30.1 pg (ref 26.0–34.0)
MCHC: 33.4 g/dL (ref 30.0–36.0)
MCV: 90.1 fL (ref 80.0–100.0)
Platelets: 225 10*3/uL (ref 150–400)
RBC: 3.62 MIL/uL — ABNORMAL LOW (ref 3.87–5.11)
RDW: 13.3 % (ref 11.5–15.5)
WBC: 13 10*3/uL — ABNORMAL HIGH (ref 4.0–10.5)
nRBC: 0 % (ref 0.0–0.2)

## 2022-07-23 LAB — BASIC METABOLIC PANEL
Anion gap: 12 (ref 5–15)
BUN: 18 mg/dL (ref 8–23)
CO2: 23 mmol/L (ref 22–32)
Calcium: 8.8 mg/dL — ABNORMAL LOW (ref 8.9–10.3)
Chloride: 101 mmol/L (ref 98–111)
Creatinine, Ser: 1.37 mg/dL — ABNORMAL HIGH (ref 0.44–1.00)
GFR, Estimated: 42 mL/min — ABNORMAL LOW (ref >60)
Glucose, Bld: 94 mg/dL (ref 70–99)
Potassium: 3.7 mmol/L (ref 3.5–5.1)
Sodium: 136 mmol/L (ref 135–145)

## 2022-07-23 NOTE — Progress Notes (Signed)
Physical Therapy Treatment Patient Details Name: Desiree [name] MRN: [id number] DOB: 09/08/1951 Today's Date: 07/23/2022   History of Present Illness Pt is a 71 y/o female s/p L TKA. PMH includes bipolar, CKD, COPD, HTN, CVA, and R THA.    PT Comments    Continuing work on functional mobility and activity tolerance;  Session focused on assessing L knee stance stabiltiy to be able to safely walk without KI and progress distance; no knee buckling noted with in room ambulation (pt hesitant for hallway amb -- will push hallway amb next session); Seems more confident and is on track for dc tomorrow; Reports she has friends who will assist her around th eclock at home; Worth considering OT consult as pt has a tub/shower to navigate for bathing  Recommendations for follow up therapy are one component of a multi-disciplinary discharge planning process, led by the attending physician.  Recommendations may be updated based on patient status, additional functional criteria and insurance authorization.  Follow Up Recommendations  Follow physician's recommendations for discharge plan and follow up therapies     Assistance Recommended at Discharge Intermittent Supervision/Assistance  Patient can return home with the following Assistance with cooking/housework;Assist for transportation;Help with stairs or ramp for entrance   Equipment Recommendations  None recommended by PT    Recommendations for Other Services       Precautions / Restrictions Precautions Precautions: Knee Precaution Booklet Issued: Yes (comment) Precaution Comments: Pt educated to not allow any pillow or bolster under knee for healing with optimal range of motion.  Required Braces or Orthoses: Knee Immobilizer - Left Knee Immobilizer - Left: Other (comment) (pt indicated Dr. Ophelia Charter took KI off) Restrictions Weight Bearing Restrictions: No LLE Weight Bearing: Weight bearing as tolerated     Mobility  Bed Mobility Overal bed  mobility: Needs Assistance Bed Mobility: Supine to Sit     Supine to sit: Min guard (without physical contact)     General bed mobility comments: pt able to self mobilize with gait belt as leg lifter, no physical assist needed, pt using bedrails and needing increased time and cues for sequencing to complete    Transfers Overall transfer level: Needs assistance Equipment used: Rolling walker (2 wheels) Transfers: Sit to/from Stand Sit to Stand: Min guard           General transfer comment: Minguard to stabilize RW    Ambulation/Gait Ambulation/Gait assistance: Editor, commissioning (Feet): 45 Feet Assistive device: Rolling walker (2 wheels) Gait Pattern/deviations: Step-to pattern, Antalgic Gait velocity: slow   Pre-gait activities: Standing L single limb satnce with close monitor fo L knee to assess for stance stability; 8 reps standing EOB with RW General Gait Details: Cues for sequence and safety, as well as to self-monitor for activity tolerance; multimodal cues also to activate L quad in stance for stability; no knee buckling noted   Stairs             Wheelchair Mobility    Modified Rankin (Stroke Patients Only)       Balance     Sitting balance-Leahy Scale: Fair       Standing balance-Leahy Scale: Poor (approaching Fair)                              Cognition Arousal/Alertness: Awake/alert Behavior During Therapy: WFL for tasks assessed/performed Overall Cognitive Status: Within Functional Limits for tasks assessed  Exercises Total Joint Exercises Ankle Circles/Pumps: AROM, Both, 10 reps Quad Sets: AROM, Left, 10 reps Short Arc Quad: AAROM, AROM, Left, 10 reps Heel Slides: AAROM, Left, 10 reps Goniometric ROM: approx 5-68 degrees; better knee flexion with dangle edge of bed/chair    General Comments        Pertinent Vitals/Pain Pain Assessment Pain Assessment:  Faces Faces Pain Scale: Hurts little more Pain Location: L knee Pain Descriptors / Indicators: Guarding, Grimacing Pain Intervention(s): Monitored during session    Home Living                          Prior Function            PT Goals (current goals can now be found in the care plan section) Acute Rehab PT Goals Patient Stated Goal: HOme Monday PT Goal Formulation: With patient Time For Goal Achievement: 08/04/22 Potential to Achieve Goals: Good Progress towards PT goals: Progressing toward goals    Frequency    7X/week      PT Plan Current plan remains appropriate    Co-evaluation              AM-PAC PT "6 Clicks" Mobility   Outcome Measure  Help needed turning from your back to your side while in a flat bed without using bedrails?: None Help needed moving from lying on your back to sitting on the side of a flat bed without using bedrails?: None Help needed moving to and from a bed to a chair (including a wheelchair)?: A Little Help needed standing up from a chair using your arms (e.g., wheelchair or bedside chair)?: A Little Help needed to walk in hospital room?: A Little Help needed climbing 3-5 steps with a railing? : A Lot 6 Click Score: 19    End of Session Equipment Utilized During Treatment: Gait belt Activity Tolerance: Patient tolerated treatment well Patient left: in chair;with call bell/phone within reach Nurse Communication: Mobility status PT Visit Diagnosis: Other abnormalities of gait and mobility (R26.89);Pain Pain - Right/Left: Left Pain - part of body: Knee     Time: 6195-0932 PT Time Calculation (min) (ACUTE ONLY): 29 min  Charges:  $Gait Training: 8-22 mins $Therapeutic Exercise: 8-22 mins                     Roney Marion, PT  Acute Rehabilitation Services Office [phone]    Desiree [name] 07/23/2022, 8:56 AM

## 2022-07-23 NOTE — Progress Notes (Signed)
   07/23/22 1000  Assess: MEWS Score  Temp 99.5 F (37.5 C)  BP (!) 146/75  Pulse Rate (!) 121  Resp 18  SpO2 97 %  O2 Device Room Air  Assess: MEWS Score  MEWS Temp 0  MEWS Systolic 0  MEWS Pulse 2  MEWS RR 0  MEWS LOC 0  MEWS Score 2  MEWS Score Color Yellow  Treat  Pain Scale 0-10  Pain Score 0  Escalate  MEWS: Escalate Yellow: discuss with charge nurse/RN and consider discussing with provider and RRT  Notify: Charge Nurse/RN  Name of Charge Nurse/RN Notified [place]  Date Charge Nurse/RN Notified 07/23/22  Time Charge Nurse/RN Notified 1100  Notify: Provider  Provider Name/Title Dean  Date Provider Notified 07/23/22  Time Provider Notified 3557  Method of Notification Face-to-face  Notification Reason Other (Comment) (pulse 121-126)  Provider response In department;No new orders  Date of Provider Response 07/23/22  Time of Provider Response 1045  Notify: Rapid Response  Name of Rapid Response RN Notified n/a  Document  Patient Outcome Other (Comment) (stable; pt is on continuos pulse ox)  Progress note created (see row info) Yes  Assess: SIRS CRITERIA  SIRS Temperature  0  SIRS Pulse 1  SIRS Respirations  0  SIRS WBC 1  SIRS Score Sum  2

## 2022-07-23 NOTE — Progress Notes (Signed)
Physical Therapy Treatment Patient Details Name: Desiree [name] MRN: [id number] DOB: 1951-05-02 Today's Date: 07/23/2022   History of Present Illness Pt is a 71 y/o female s/p L TKA. PMH includes bipolar, CKD, COPD, HTN, CVA, and R THA.    PT Comments    Continuing work on functional mobility and activity tolerance;  Session focused on progressing amb distance, with good success; able to walk in the hallway with RW, and L knee is nice and stable in stance; Discussed plans for dc home tomorrow; Pt indicated her ride won't be availble until the evening   Recommendations for follow up therapy are one component of a multi-disciplinary discharge planning process, led by the attending physician.  Recommendations may be updated based on patient status, additional functional criteria and insurance authorization.  Follow Up Recommendations  Follow physician's recommendations for discharge plan and follow up therapies     Assistance Recommended at Discharge Intermittent Supervision/Assistance  Patient can return home with the following Assistance with cooking/housework;Assist for transportation;Help with stairs or ramp for entrance   Equipment Recommendations  None recommended by PT    Recommendations for Other Services       Precautions / Restrictions Precautions Precautions: Knee Precaution Booklet Issued: Yes (comment) Precaution Comments: Pt educated to not allow any pillow or bolster under knee for healing with optimal range of motion.  Required Braces or Orthoses: Knee Immobilizer - Left Knee Immobilizer - Left: Other (comment) (pt indicated Dr. Ophelia [name] took KI off) Restrictions LLE Weight Bearing: Weight bearing as tolerated     Mobility  Bed Mobility Overal bed mobility: Needs Assistance Bed Mobility: Sit to Supine     Supine to sit: Min assist     General bed mobility comments: Used non-operated LE to suport LLE coming onto bed    Transfers Overall transfer level: Needs  assistance Equipment used: Rolling walker (2 wheels) Transfers: Sit to/from Stand Sit to Stand: Min guard (wihtout physical contact)           General transfer comment: Slow rise, but with good hand placement    Ambulation/Gait Ambulation/Gait assistance: Min guard Gait Distance (Feet): 85 Feet Assistive device: Rolling walker (2 wheels) Gait Pattern/deviations: Step-through pattern (emerging) Gait velocity: slow     General Gait Details: Cues for sequence and safety, as well as to self-monitor for activity tolerance; multimodal cues also to activate L quad in stance for stability; no knee buckling noted   Stairs             Wheelchair Mobility    Modified Rankin (Stroke Patients Only)       Balance     Sitting balance-Leahy Scale: Fair       Standing balance-Leahy Scale: Poor (approaching Fair)                              Cognition Arousal/Alertness: Awake/alert Behavior During Therapy: WFL for tasks assessed/performed Overall Cognitive Status: Within Functional Limits for tasks assessed                                          Exercises      General Comments        Pertinent Vitals/Pain Pain Assessment Pain Assessment: Faces Faces Pain Scale: Hurts even more Pain Location: L knee Pain Descriptors / Indicators: Guarding, Grimacing Pain Intervention(s): Monitored during  session, Premedicated before session    Home Living                          Prior Function            PT Goals (current goals can now be found in the care plan section) Acute Rehab PT Goals Patient Stated Goal: HOme Monday PT Goal Formulation: With patient Time For Goal Achievement: 08/04/22 Potential to Achieve Goals: Good Progress towards PT goals: Progressing toward goals    Frequency    7X/week      PT Plan Current plan remains appropriate    Co-evaluation              AM-PAC PT "6 Clicks" Mobility    Outcome Measure  Help needed turning from your back to your side while in a flat bed without using bedrails?: None Help needed moving from lying on your back to sitting on the side of a flat bed without using bedrails?: None Help needed moving to and from a bed to a chair (including a wheelchair)?: A Little Help needed standing up from a chair using your arms (e.g., wheelchair or bedside chair)?: A Little Help needed to walk in hospital room?: A Little Help needed climbing 3-5 steps with a railing? : A Lot 6 Click Score: 19    End of Session Equipment Utilized During Treatment: Gait belt Activity Tolerance: Patient tolerated treatment well Patient left: in bed;with call bell/phone within reach Nurse Communication: Mobility status (and pt nauseated and requesting meds) PT Visit Diagnosis: Other abnormalities of gait and mobility (R26.89);Pain Pain - Right/Left: Left Pain - part of body: Knee     Time: 6010-9323 PT Time Calculation (min) (ACUTE ONLY): 21 min  Charges:  $Gait Training: 8-22 mins                     Roney Marion, [place] Office [phone]    [name] 07/23/2022, 9:03 PM

## 2022-07-23 NOTE — Plan of Care (Signed)
  Problem: Clinical Measurements: Goal: Postoperative complications will be avoided or minimized Outcome: Progressing   Problem: Pain Management: Goal: Pain level will decrease with appropriate interventions Outcome: Progressing   Problem: Health Behavior/Discharge Planning: Goal: Ability to manage health-related needs will improve Outcome: Progressing   Problem: Activity: Goal: Risk for activity intolerance will decrease Outcome: Progressing   Problem: Nutrition: Goal: Adequate nutrition will be maintained Outcome: Progressing

## 2022-07-23 NOTE — Progress Notes (Signed)
  Subjective: Patient stable.  Walked in the room this morning.  Pain is controlled.   Objective: Vital signs in last 24 hours: Temp:  [98.9 F (37.2 C)-100.5 F (38.1 C)] 100.1 F (37.8 C) (10/01 0928) Pulse Rate:  [90-124] 124 (10/01 0928) Resp:  [17-20] 20 (10/01 0928) BP: (136-180)/(68-83) 152/68 (10/01 0928) SpO2:  [88 %-97 %] 93 % (10/01 0928)  Intake/Output from previous day: 09/30 0701 - 10/01 0700 In: 360 [P.O.:360] Out: 1250 [Urine:1250] Intake/Output this shift: Total I/O In: -  Out: 550 [Urine:550]  Exam:  Dorsiflexion/Plantar flexion intact No cellulitis present  Labs: Recent Labs    07/22/22 0300 07/23/22 0358  HGB 10.6* 10.9*   Recent Labs    07/22/22 0300 07/23/22 0358  WBC 12.0* 13.0*  RBC 3.52* 3.62*  HCT 32.2* 32.6*  PLT 216 225   Recent Labs    07/22/22 0300 07/23/22 0358  NA 142 136  K 4.3 3.7  CL 110 101  CO2 24 23  BUN 25* 18  CREATININE 1.62* 1.37*  GLUCOSE 141* 94  CALCIUM 8.9 8.8*   No results for input(s): "LABPT", "INR" in the last 72 hours.  Assessment/Plan: Plan at this time is discharge to home tomorrow.  We will obtain OT consult per physical therapy recommendation   G Alphonzo Severance 07/23/2022, 10:29 AM

## 2022-07-24 ENCOUNTER — Encounter (HOSPITAL_COMMUNITY): Payer: Self-pay | Admitting: Orthopaedic Surgery

## 2022-07-24 DIAGNOSIS — E1122 Type 2 diabetes mellitus with diabetic chronic kidney disease: Secondary | ICD-10-CM | POA: Diagnosis not present

## 2022-07-24 DIAGNOSIS — N183 Chronic kidney disease, stage 3 unspecified: Secondary | ICD-10-CM | POA: Diagnosis not present

## 2022-07-24 DIAGNOSIS — J441 Chronic obstructive pulmonary disease with (acute) exacerbation: Secondary | ICD-10-CM | POA: Diagnosis not present

## 2022-07-24 DIAGNOSIS — M1712 Unilateral primary osteoarthritis, left knee: Secondary | ICD-10-CM | POA: Diagnosis not present

## 2022-07-24 DIAGNOSIS — Z96641 Presence of right artificial hip joint: Secondary | ICD-10-CM | POA: Diagnosis not present

## 2022-07-24 DIAGNOSIS — Z8673 Personal history of transient ischemic attack (TIA), and cerebral infarction without residual deficits: Secondary | ICD-10-CM | POA: Diagnosis not present

## 2022-07-24 DIAGNOSIS — Z79899 Other long term (current) drug therapy: Secondary | ICD-10-CM | POA: Diagnosis not present

## 2022-07-24 DIAGNOSIS — I129 Hypertensive chronic kidney disease with stage 1 through stage 4 chronic kidney disease, or unspecified chronic kidney disease: Secondary | ICD-10-CM | POA: Diagnosis not present

## 2022-07-24 DIAGNOSIS — J45909 Unspecified asthma, uncomplicated: Secondary | ICD-10-CM | POA: Diagnosis not present

## 2022-07-24 LAB — CBC
HCT: 32.5 % — ABNORMAL LOW (ref 36.0–46.0)
Hemoglobin: 10.6 g/dL — ABNORMAL LOW (ref 12.0–15.0)
MCH: 29.8 pg (ref 26.0–34.0)
MCHC: 32.6 g/dL (ref 30.0–36.0)
MCV: 91.3 fL (ref 80.0–100.0)
Platelets: 217 10*3/uL (ref 150–400)
RBC: 3.56 MIL/uL — ABNORMAL LOW (ref 3.87–5.11)
RDW: 13.5 % (ref 11.5–15.5)
WBC: 11.9 10*3/uL — ABNORMAL HIGH (ref 4.0–10.5)
nRBC: 0 % (ref 0.0–0.2)

## 2022-07-24 LAB — BASIC METABOLIC PANEL
Anion gap: 9 (ref 5–15)
BUN: 20 mg/dL (ref 8–23)
CO2: 23 mmol/L (ref 22–32)
Calcium: 8.7 mg/dL — ABNORMAL LOW (ref 8.9–10.3)
Chloride: 104 mmol/L (ref 98–111)
Creatinine, Ser: 1.61 mg/dL — ABNORMAL HIGH (ref 0.44–1.00)
GFR, Estimated: 34 mL/min — ABNORMAL LOW (ref >60)
Glucose, Bld: 113 mg/dL — ABNORMAL HIGH (ref 70–99)
Potassium: 4 mmol/L (ref 3.5–5.1)
Sodium: 136 mmol/L (ref 135–145)

## 2022-07-24 MED ORDER — METHOCARBAMOL 500 MG PO TABS: 500.0000 mg | ORAL_TABLET | Freq: Three times a day (TID) | ORAL | 0 refills | Status: DC | PRN

## 2022-07-24 MED ORDER — OXYCODONE-ACETAMINOPHEN 5-325 MG PO TABS: 1.0000 | ORAL_TABLET | Freq: Four times a day (QID) | ORAL | 0 refills | Status: DC | PRN

## 2022-07-24 NOTE — Progress Notes (Signed)
Physical Therapy Treatment Patient Details Name: Desiree [name] MRN: [id number] DOB: 04/19/1951 Today's Date: 07/24/2022   History of Present Illness 71 y/o female s/p L TKA. PMH includes bipolar, CKD, COPD, HTN, CVA, and R THA.    PT Comments    Continuing work on functional mobility and activity tolerance;  Session focused on progressive amb and stair training, both of which pt performed slowly, but well; Knee is stable in stance and pt is able to walk household distances with RW; no difficulty going up and down one step; OK for dc home from PT standpoint;   Session conducted on room air; pt with incr HR, tending to be in the 120s with amb, up to 135 observed highest; O2 sats initially 89-90%; incr to 95-98% with focused deep breathing   Recommendations for follow up therapy are one component of a multi-disciplinary discharge planning process, led by the attending physician.  Recommendations may be updated based on patient status, additional functional criteria and insurance authorization.  Follow Up Recommendations  Follow physician's recommendations for discharge plan and follow up therapies     Assistance Recommended at Discharge Intermittent Supervision/Assistance  Patient can return home with the following Assistance with cooking/housework;Assist for transportation;Help with stairs or ramp for entrance   Equipment Recommendations  BSC/3in1;Other (comment) (Discussed with OT, who indicated pt needs safe options for bathing; her current 3in1 stays over her commode)    Recommendations for Other Services       Precautions / Restrictions Precautions Precautions: Knee Precaution Comments: Pt educated to not allow any pillow or bolster under knee for healing with optimal range of motion.  Restrictions LLE Weight Bearing: Weight bearing as tolerated     Mobility  Bed Mobility                    Transfers Overall transfer level: Needs assistance Equipment used: Rolling  walker (2 wheels) Transfers: Sit to/from Stand Sit to Stand: Min guard           General transfer comment: Slow rise, but with good hand placement    Ambulation/Gait Ambulation/Gait assistance: Min guard (without physical contact) Gait Distance (Feet): 110 Feet Assistive device: Rolling walker (2 wheels) Gait Pattern/deviations: Step-through pattern (emerging)       General Gait Details: Showing good carryover from yesterday's session; L knee stable in stance   Stairs Stairs: Yes Stairs assistance: Min guard (without physical contact) Stair Management: Forwards, Backwards, With walker Number of Stairs: 1 (x 2 techniques) General stair comments: demo cues for sequence, and educated in rationale; pt practiced both forwards and backwards to ascend the step with good return demo and nice stable knee in stance   Wheelchair Mobility    Modified Rankin (Stroke Patients Only)       Balance     Sitting balance-Leahy Scale: Fair       Standing balance-Leahy Scale: Fair                              Cognition Arousal/Alertness: Awake/alert Behavior During Therapy: WFL for tasks assessed/performed Overall Cognitive Status: Within Functional Limits for tasks assessed                                          Exercises      General Comments General comments (skin integrity, edema,  etc.): Sesison conducted on room air; pt with incr HR, tending to be in the 120s with amb, up to 135 observed highest; O2 sats initially 89-90%; incr to 95-98% with focused deep breathing      Pertinent Vitals/Pain Pain Assessment Pain Assessment: Faces Pain Score: 5  Pain Location: L knee Pain Descriptors / Indicators: Guarding, Grimacing Pain Intervention(s): Monitored during session    Home Living                          Prior Function            PT Goals (current goals can now be found in the care plan section) Acute Rehab PT  Goals Patient Stated Goal: HOme Monday PT Goal Formulation: With patient Time For Goal Achievement: 08/04/22 Potential to Achieve Goals: Good Progress towards PT goals: Progressing toward goals (near meeting goals)    Frequency    7X/week      PT Plan Current plan remains appropriate    Co-evaluation              AM-PAC PT "6 Clicks" Mobility   Outcome Measure  Help needed turning from your back to your side while in a flat bed without using bedrails?: None Help needed moving from lying on your back to sitting on the side of a flat bed without using bedrails?: None Help needed moving to and from a bed to a chair (including a wheelchair)?: A Little Help needed standing up from a chair using your arms (e.g., wheelchair or bedside chair)?: A Little Help needed to walk in hospital room?: A Little Help needed climbing 3-5 steps with a railing? : A Lot 6 Click Score: 19    End of Session Equipment Utilized During Treatment: Gait belt Activity Tolerance: Patient tolerated treatment well Patient left: in chair;with call bell/phone within reach Nurse Communication: Mobility status PT Visit Diagnosis: Other abnormalities of gait and mobility (R26.89);Pain Pain - Right/Left: Left Pain - part of body: Knee     Time: 1006-1040 PT Time Calculation (min) (ACUTE ONLY): 34 min  Charges:                        Roney Marion, [place] Office [phone]    [name] 07/24/2022, 12:36 PM

## 2022-07-24 NOTE — Evaluation (Signed)
Occupational Therapy Evaluation Patient Details Name: Desiree [name] MRN: [id number] DOB: 1951-09-23 Today's Date: 07/24/2022   History of Present Illness 71 y/o female s/p L TKA. PMH includes bipolar, CKD, COPD, HTN, CVA, and R THA.   Clinical Impression   PTA, pt was living alone and was independent; using a cane as needed for mobility. Currently, pt requires Min Guard-Mod A for LB ADLs and Min Guard A for functional mobility using RW. Provided education on toilet transfer. Pt performing toileting, grooming, and peri care at sink with Min Guard A. Pt presenting with decreased activity tolerance with fatigue and elevated HR during sponge bath. At end of session, pt reporting nausea. Notified RN. Pt will require additional acute OT to address LB dressing and tub transfer. Recommend dc home once medically stable per physician.      Recommendations for follow up therapy are one component of a multi-disciplinary discharge planning process, led by the attending physician.  Recommendations may be updated based on patient status, additional functional criteria and insurance authorization.   Follow Up Recommendations  No OT follow up    Assistance Recommended at Discharge PRN  Patient can return home with the following      Functional Status Assessment  Patient has had a recent decline in their functional status and demonstrates the ability to make significant improvements in function in a reasonable and predictable amount of time.  Equipment Recommendations  None recommended by OT    Recommendations for Other Services       Precautions / Restrictions Precautions Precautions: Knee Required Braces or Orthoses: Knee Immobilizer - Left Knee Immobilizer - Left: Other (comment) (Per chart, Dr Deneise Lever KI) Restrictions Weight Bearing Restrictions: Yes LLE Weight Bearing: Weight bearing as tolerated      Mobility Bed Mobility Overal bed mobility: Needs Assistance Bed Mobility: Sit to  Supine     Supine to sit: Min guard, HOB elevated     General bed mobility comments: Significant time and increased effort. Pt perform bed mobility without phsyical A. Using RLE to hook under L foot    Transfers Overall transfer level: Needs assistance Equipment used: Rolling walker (2 wheels) Transfers: Sit to/from Stand Sit to Stand: Min guard           General transfer comment: Min Guard A for safety      Balance Overall balance assessment: Needs assistance Sitting-balance support: No upper extremity supported, Feet supported Sitting balance-Leahy Scale: Fair     Standing balance support: No upper extremity supported, During functional activity Standing balance-Leahy Scale: Fair                             ADL either performed or assessed with clinical judgement   ADL Overall ADL's : Needs assistance/impaired Eating/Feeding: Set up;Sitting   Grooming: Wash/dry face;Min guard;Standing;Wash/dry hands   Upper Body Bathing: Supervision/ safety;Set up;Sitting   Lower Body Bathing: Min guard;Sit to/from stand Lower Body Bathing Details (indicate cue type and reason): Pt performign peri care/sponge bath at sink with Min guard A for safety. Pt with decreased activity toelrance as seen by gradually elevated HR (Max 140) and fatigue. Upper Body Dressing : Supervision/safety;Set up;Sitting   Lower Body Dressing: Moderate assistance;Sit to/from stand   Toilet Transfer: Min guard;Ambulation;Rolling walker (2 wheels);BSC/3in1           Functional mobility during ADLs: Min guard;Rolling walker (2 wheels) General ADL Comments: Pt presenting with decreased balance, strength,  and activity toelrance. Very motivated     Financial controller      Pertinent Vitals/Pain Pain Assessment Pain Assessment: Faces Faces Pain Scale: Hurts little more Pain Location: L knee Pain Descriptors / Indicators: Guarding, Grimacing Pain Intervention(s):  Limited activity within patient's tolerance, Monitored during session, Repositioned, Patient requesting pain meds-RN notified     Hand Dominance Right   Extremity/Trunk Assessment Upper Extremity Assessment Upper Extremity Assessment: Overall WFL for tasks assessed   Lower Extremity Assessment Lower Extremity Assessment: Defer to PT evaluation   Cervical / Trunk Assessment Cervical / Trunk Assessment: Normal   Communication Communication Communication: No difficulties   Cognition Arousal/Alertness: Awake/alert Behavior During Therapy: WFL for tasks assessed/performed Overall Cognitive Status: Within Functional Limits for tasks assessed                                       General Comments  HR gradually rising to 138-140 while performing sponge bath at sink. During walk to recliner, pt reporting she is nausea. RN notified    Exercises     Shoulder Instructions      Home Living Family/patient expects to be discharged to:: Private residence Living Arrangements: Alone Available Help at Discharge: Friend(s);Available PRN/intermittently Type of Home: Apartment Home Access: Stairs to enter Entrance Stairs-Number of Steps: 1 curb step Entrance Stairs-Rails: None Home Layout: One level     Bathroom Shower/Tub: Chief Strategy Officer: Standard     Home Equipment: Agricultural consultant (2 wheels);BSC/3in1          Prior Functioning/Environment Prior Level of Function : Independent/Modified Independent             Mobility Comments: Using cane for mobility as needed ADLs Comments: Independent with ADLs and IADLs. Retired Lawyer        OT Problem List: Decreased strength;Decreased range of motion;Decreased activity tolerance;Impaired balance (sitting and/or standing);Decreased knowledge of precautions;Decreased knowledge of use of DME or AE      OT Treatment/Interventions: Self-care/ADL training;Therapeutic exercise;Energy conservation;DME  and/or AE instruction;Therapeutic activities;Patient/family education    OT Goals(Current goals can be found in the care plan section) Acute Rehab OT Goals Patient Stated Goal: Go home OT Goal Formulation: With patient Time For Goal Achievement: 08/07/22 Potential to Achieve Goals: Good  OT Frequency: Min 3X/week    Co-evaluation              AM-PAC OT "6 Clicks" Daily Activity     Outcome Measure Help from another person eating meals?: None Help from another person taking care of personal grooming?: A Little Help from another person toileting, which includes using toliet, bedpan, or urinal?: A Little Help from another person bathing (including washing, rinsing, drying)?: A Little Help from another person to put on and taking off regular upper body clothing?: A Little Help from another person to put on and taking off regular lower body clothing?: A Lot 6 Click Score: 18   End of Session Equipment Utilized During Treatment: Rolling walker (2 wheels);Gait belt Nurse Communication: Mobility status;Patient requests pain meds  Activity Tolerance: Patient tolerated treatment well Patient left: in chair;with call bell/phone within reach  OT Visit Diagnosis: Unsteadiness on feet (R26.81);Other abnormalities of gait and mobility (R26.89);Muscle weakness (generalized) (M62.81)                Time:  1245-8099 OT Time Calculation (min): 32 min Charges:  OT General Charges $OT Visit: 1 Visit OT Evaluation $OT Eval Low Complexity: 1 Low OT Treatments $Self Care/Home Management : 8-22 mins  Deante Blough MSOT, OTR/L Acute Rehab Office: [phone]  Theodoro Grist Azaria Stegman 07/24/2022, 8:43 AM

## 2022-07-24 NOTE — Progress Notes (Addendum)
Patient ID: ANALYN MATUSEK, female   DOB: Mar 05, 1951, 71 y.o.   MRN: [id number]   Subjective: 3 Days Post-Op Procedure(s) (LRB): LEFT TOTAL KNEE ARTHROPLASTY (Left) INTRA-ARTICULAR INJECTION UNDER ANESTHESIA (Right) Patient reports pain as mild and moderate.  Low grade temp. Triflow IS encouraged. Slow progress with PT , now safe for ambulation.   Objective: Vital signs in last 24 hours: Temp:  [97.4 F (36.3 C)-101 F (38.3 C)] 98.1 F (36.7 C) (10/02 0739) Pulse Rate:  [97-121] 102 (10/02 0739) Resp:  [11-18] 14 (10/02 0536) BP: (109-146)/(61-75) 112/65 (10/02 0739) SpO2:  [86 %-97 %] 97 % (10/02 0834)  Intake/Output from previous day: 10/01 0701 - 10/02 0700 In: 1340 [P.O.:840; I.V.:500] Out: 950 [Urine:950] Intake/Output this shift: No intake/output data recorded.  Recent Labs    07/22/22 0300 07/23/22 0358 07/24/22 0157  HGB 10.6* 10.9* 10.6*   Recent Labs    07/23/22 0358 07/24/22 0157  WBC 13.0* 11.9*  RBC 3.62* 3.56*  HCT 32.6* 32.5*  PLT 225 217   Recent Labs    07/23/22 0358 07/24/22 0157  NA 136 136  K 3.7 4.0  CL 101 104  CO2 23 23  BUN 18 20  CREATININE 1.37* 1.61*  GLUCOSE 94 113*  CALCIUM 8.8* 8.7*   No results for input(s): "LABPT", "INR" in the last 72 hours.  Neurologically intact No results found.  Assessment/Plan: 3 Days Post-Op Procedure(s) (LRB): LEFT TOTAL KNEE ARTHROPLASTY (Left) INTRA-ARTICULAR INJECTION UNDER ANESTHESIA (Right) Up with therapy, discharge home today.  Had normal ABI 2021, does not need repeat at this time.   [name] 07/24/2022, 9:38 AM

## 2022-07-24 NOTE — Progress Notes (Signed)
Occupational Therapy Treatment Patient Details Name: Desiree [name] MRN: [id number] DOB: 1951/08/08 Today's Date: 07/24/2022   History of present illness 71 y/o female s/p L TKA. PMH includes bipolar, CKD, COPD, HTN, CVA, and R THA.   OT comments  Returning for second session to provide education on compensatory techniques for LB dressing and tub transfer. Pt donning underwear with Min guard A. Pt performing tub transfer with Min Guard-Min A, RW, and 3n1. Continue to recommend dc to home and will continue to follow acutely as admitted.   Recommendations for follow up therapy are one component of a multi-disciplinary discharge planning process, led by the attending physician.  Recommendations may be updated based on patient status, additional functional criteria and insurance authorization.    Follow Up Recommendations  No OT follow up    Assistance Recommended at Discharge PRN  Patient can return home with the following      Equipment Recommendations  None recommended by OT    Recommendations for Other Services      Precautions / Restrictions Precautions Precautions: Knee Precaution Comments:  Required Braces or Orthoses: Knee Immobilizer - Left Knee Immobilizer - Left: Other (comment) (Per chart, Dr Deneise Lever KI) Restrictions Weight Bearing Restrictions: Yes LLE Weight Bearing: Weight bearing as tolerated       Mobility Bed Mobility Overal bed mobility: Needs Assistance Bed Mobility: Sit to Supine     Supine to sit: Min assist     General bed mobility comments: Min A for elevating LLE over EOB    Transfers Overall transfer level: Needs assistance Equipment used: Rolling walker (2 wheels) Transfers: Sit to/from Stand Sit to Stand: Min guard           General transfer comment: Min Guard A for safety     Balance Overall balance assessment: Needs assistance Sitting-balance support: No upper extremity supported, Feet supported Sitting balance-Leahy Scale:  Fair     Standing balance support: No upper extremity supported, During functional activity Standing balance-Leahy Scale: Fair                             ADL either performed or assessed with clinical judgement   ADL Overall ADL's : Needs assistance/impaired             Lower Body Bathing: Min guard;Sit to/from stand Lower Body Bathing Details (indicate cue type and reason): Pt donning underwear with LLE in first         Toilet Transfer: Min guard;Ambulation;Rolling walker (2 wheels);BSC/3in1       Tub/ Shower Transfer: Tub transfer;Minimal assistance;Min guard;Ambulation;BSC/3in1;Rolling walker (2 wheels) Tub/Shower Transfer Details (indicate cue type and reason): Min Guard A-Min A for balance and safety. Educating on sequencing and use of 3n1. Pt reporting she plans on sponge bathing until the pain is less. Provding handout for tub transfer Functional mobility during ADLs: Min guard;Rolling walker (2 wheels) General ADL Comments: Pt presenting with decreased balance, strength, and activity toelrance. Very motivated    Extremity/Trunk Assessment Upper Extremity Assessment Upper Extremity Assessment: Overall WFL for tasks assessed   Lower Extremity Assessment Lower Extremity Assessment: Defer to PT evaluation        Vision       Perception     Praxis      Cognition Arousal/Alertness: Awake/alert Behavior During Therapy: WFL for tasks assessed/performed Overall Cognitive Status: Within Functional Limits for tasks assessed  Exercises      Shoulder Instructions       General Comments HR elevating to 120-130s with activity    Pertinent Vitals/ Pain       Pain Assessment Pain Assessment: Faces Faces Pain Scale: Hurts little more Pain Location: L knee Pain Descriptors / Indicators: Guarding, Grimacing Pain Intervention(s): Monitored during session, Repositioned  Home Living                                           Prior Functioning/Environment              Frequency  Min 3X/week        Progress Toward Goals  OT Goals(current goals can now be found in the care plan section)  Progress towards OT goals: Progressing toward goals  Acute Rehab OT Goals OT Goal Formulation: With patient Time For Goal Achievement: 08/07/22 Potential to Achieve Goals: Good ADL Goals Pt Will Perform Lower Body Dressing: with supervision;sit to/from stand;with adaptive equipment Pt Will Transfer to Toilet: with supervision;ambulating;bedside commode Pt Will Perform Tub/Shower Transfer: Tub transfer;3 in 1;rolling walker;with supervision  Plan Discharge plan remains appropriate    Co-evaluation                 AM-PAC OT "6 Clicks" Daily Activity     Outcome Measure   Help from another person eating meals?: None Help from another person taking care of personal grooming?: A Little Help from another person toileting, which includes using toliet, bedpan, or urinal?: A Little Help from another person bathing (including washing, rinsing, drying)?: A Little Help from another person to put on and taking off regular upper body clothing?: A Little Help from another person to put on and taking off regular lower body clothing?: A Lot 6 Click Score: 18    End of Session Equipment Utilized During Treatment: Rolling walker (2 wheels);Gait belt  OT Visit Diagnosis: Unsteadiness on feet (R26.81);Other abnormalities of gait and mobility (R26.89);Muscle weakness (generalized) (M62.81)   Activity Tolerance Patient tolerated treatment well   Patient Left in chair;with call bell/phone within reach   Nurse Communication Mobility status;Patient requests pain meds        Time: 6256-3893 OT Time Calculation (min): 34 min  Charges: OT General Charges $OT Visit: 1 Visit OT Treatments $Self Care/Home Management : 23-37 mins  Eadie Repetto MSOT, OTR/L Acute  Rehab Office: [phone]  Theodoro Grist Alyssa Mancera 07/24/2022, 12:40 PM

## 2022-07-24 NOTE — Plan of Care (Signed)
  Problem: Activity: Goal: Ability to avoid complications of mobility impairment will improve Outcome: Progressing   Problem: Activity: Goal: Range of joint motion will improve Outcome: Progressing   Problem: Clinical Measurements: Goal: Postoperative complications will be avoided or minimized Outcome: Progressing   Problem: Pain Management: Goal: Pain level will decrease with appropriate interventions Outcome: Progressing   Problem: Education: Goal: Knowledge of the prescribed therapeutic regimen will improve 07/24/2022 0057 by Lynnea Ferrier, RN Outcome: Progressing 07/24/2022 0056 by Lynnea Ferrier, RN Outcome: Progressing   Problem: Clinical Measurements: Goal: Respiratory complications will improve Outcome: Progressing   Problem: Activity: Goal: Risk for activity intolerance will decrease Outcome: Progressing   Problem: Skin Integrity: Goal: Risk for impaired skin integrity will decrease Outcome: Progressing

## 2022-07-25 ENCOUNTER — Telehealth: Payer: Self-pay | Admitting: *Deleted

## 2022-07-25 NOTE — Telephone Encounter (Signed)
Ortho bundle D/C call completed. 

## 2022-07-27 ENCOUNTER — Other Ambulatory Visit: Payer: Self-pay | Admitting: *Deleted

## 2022-07-27 DIAGNOSIS — M1712 Unilateral primary osteoarthritis, left knee: Secondary | ICD-10-CM

## 2022-07-28 ENCOUNTER — Ambulatory Visit (INDEPENDENT_AMBULATORY_CARE_PROVIDER_SITE_OTHER): Payer: Medicare HMO | Admitting: Surgery

## 2022-07-28 ENCOUNTER — Telehealth: Payer: Self-pay | Admitting: Orthopaedic Surgery

## 2022-07-28 ENCOUNTER — Telehealth: Payer: Self-pay | Admitting: *Deleted

## 2022-07-28 ENCOUNTER — Ambulatory Visit: Payer: Self-pay

## 2022-07-28 ENCOUNTER — Encounter: Payer: Self-pay | Admitting: Surgery

## 2022-07-28 VITALS — BP 117/68 | HR 96 | Temp 98.0°F | Ht 62.0 in | Wt 166.0 lb

## 2022-07-28 DIAGNOSIS — R6889 Other general symptoms and signs: Secondary | ICD-10-CM | POA: Diagnosis not present

## 2022-07-28 DIAGNOSIS — R5383 Other fatigue: Secondary | ICD-10-CM

## 2022-07-28 DIAGNOSIS — Z96652 Presence of left artificial knee joint: Secondary | ICD-10-CM | POA: Diagnosis not present

## 2022-07-28 MED ORDER — TRAMADOL HCL 50 MG PO TABS: 50.0000 mg | ORAL_TABLET | Freq: Four times a day (QID) | ORAL | 0 refills | Status: DC | PRN

## 2022-07-28 NOTE — Telephone Encounter (Signed)
I called patient. The mepilex that we just put on has some creases in the bandage part. I advised that the bandage was sealed around the edges and that this should be ok. She states that it is not pulling up anywhere. She will keep an eye on it. If the bandage gets wet on the inside, she will remove and call us to replace.

## 2022-07-28 NOTE — Telephone Encounter (Signed)
Ortho bundle 7 day call completed. 

## 2022-07-28 NOTE — Progress Notes (Signed)
Post-Op Visit Note   Patient: Desiree [name]           Date of Birth: Nov 30, 1950           MRN: [id number] Visit Date: 07/28/2022 PCP: [name], MD   Assessment & Plan:  Chief Complaint:  Chief Complaint  Patient presents with   Left Knee - Routine Post Op    07/21/2022 Left TKA, right knee injection under anesthesia  71 year old female returns.  1 week status post left total knee replacement.  States that knee is doing well.  She has been having some itching with the oxycodone.  Wondering if she can just take Tylenol for pain.  States that she is also had some feeling of fatigue and dizziness.  May be from the pain medication. Visit Diagnoses:  1. S/P total knee arthroplasty, left   2. Other fatigue     Plan: Today blood work was drawn to check a CBC and bmet.  Follow-up with Dr. Lorin Mercy in 1 week for wound check and possible staple removal.  Since she has been having itching with Percocet recommend that she discontinue this medication.  She would like to just use Tylenol and I think this is okay.  I will send in some Ultram to see if this will help some.  But she will discontinue the medication if she has itching.  Follow-Up Instructions: Return in about 1 week (around 08/04/2022) for with dr yates for wound check and possible staple removal.   Orders:  Orders Placed This Encounter  Procedures   XR Knee 1-2 Views Left   CBC with Differential   Basic Metabolic Panel (BMET)   No orders of the defined types were placed in this encounter.   Imaging: No results found.  PMFS History: Patient Active Problem List   Diagnosis Date Noted   Arthritis of left knee 07/21/2022   Hypertrophic cardiomyopathy ([place]) 07/10/2022   Unilateral primary osteoarthritis, left knee 07/09/2022   Insomnia secondary to chronic pain 12/27/2020   Insomnia due to other mental disorder 12/27/2020   Snoring 12/27/2020   Inadequate sleep hygiene 12/27/2020   S/P total right hip arthroplasty  12/18/2020   Arthritis of right hip 12/17/2020   Encounter for preoperative assessment 12/10/2020   Infection of nail bed of finger of right hand 09/15/2020   Pain in right finger(s) 08/31/2020   Ganglion of right wrist 08/31/2020   COPD mixed type ([place]) 12/26/2018   Healthcare maintenance 12/26/2018   History of fall 11/05/2018   Weight loss 11/05/2018   Weakness 11/05/2018   Gangrene of finger ([place]) 10/09/2018   Raynaud's phenomenon with gangrene ([place]) 10/09/2018   LVH (left ventricular hypertrophy) 10/09/2018   Vasospasm ([place]) 09/06/2018   Hypotension 08/08/2018   Leucocytosis 08/08/2018   Elevated troponin I level 08/08/2018   Nausea and vomiting 08/06/2018   Pneumonia 07/12/2018   COPD exacerbation ([place]) 07/11/2018   Primary osteoarthritis of both feet 12/21/2017   DDD (degenerative disc disease), lumbar 12/21/2017   Primary osteoarthritis of both knees 12/21/2017   History of bilateral carpal tunnel release 12/21/2017   DDD (degenerative disc disease), cervical 11/15/2017   Former smoker 11/15/2017   Bronchiectasis without complication ([place]) 16/04/3709   Impingement syndrome of right shoulder 07/25/2017   Hx of fusion of cervical spine 07/25/2017   Cervical spinal stenosis 09/27/2016   Polypharmacy 06/23/2015   Hyperlipidemia 04/19/2015   Visit for preventive health examination 01/01/2015   Primary osteoarthritis involving multiple joints 01/01/2015  Bipolar affective disorder, currently depressed, moderate (HCC)    Bipolar I disorder with mania (HCC) 08/17/2014   Hypertension 10/21/2013   Dizziness 03/25/2013   Medication withdrawal (HCC) 03/05/2013   Diabetes mellitus with renal manifestations, controlled (HCC) 11/10/2012   Alopecia areata 02/06/2012   Obesity (BMI 30-39.9) 02/06/2012   Renal insufficiency 07/16/2011   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    Exertional dyspnea 02/07/2011   Dyspnea on exertion 01/19/2011   DM (diabetes mellitus), type 2 (HCC)  04/22/2010   Carpal tunnel syndrome 02/02/2010   CONSTIPATION 02/02/2010   Primary osteoarthritis of both hands 02/02/2010   CATARACTS 04/13/2009   PAIN IN JOINT, ANKLE AND FOOT 09/16/2008   Eosinophilia 07/21/2008   AFFECTIVE DISORDER 04/21/2008   BACK PAIN, CHRONIC 02/12/2008   LEG PAIN 02/12/2008   ABNORMAL INVOLUNTARY MOVEMENTS 12/04/2007   POSTURAL LIGHTHEADEDNESS 11/04/2007   COLONIC POLYPS, HX OF 11/04/2007   Essential hypertension 06/17/2007   HYPERLIPIDEMIA 04/08/2007   GERD 04/08/2007   LOW BACK PAIN SYNDROME 04/08/2007   CHEST PAIN, RECURRENT 04/08/2007   Past Medical History:  Diagnosis Date   Acute gastritis without bleeding 08/08/2018   Anemia    Anxiety    Asthma    Bipolar disorder (HCC)    CANDIDIASIS, ESOPHAGEAL 07/28/2009   Qualifier: Diagnosis of  By: Fabian Sharp MD, Neta Mends    Chronic kidney disease    CKD III   Complication of anesthesia    was told she stopped breathing for one of her finger surgeries   COPD (chronic obstructive pulmonary disease) (HCC)    Depression    Diabetes mellitus without complication (HCC)    no meds   Dyspnea    At rest,and with activity   FH: colonic polyps    Fractured elbow    right    GERD (gastroesophageal reflux disease)    Headache    migraines   HH (hiatus hernia)    History of carpal tunnel syndrome    History of chest pain    History of transfusion of packed red blood cells    Hyperlipidemia    Hypertension    Mitral valve prolapse    Neuroleptic-induced tardive dyskinesia    Osteoarthritis of more than one site    Pneumonia    Seasonal allergies    Stroke [facility])     Family History  Problem Relation Age of Onset   Diabetes Mother    Hypertension Mother    Heart attack Mother    Heart attack Father    Heart disease Father    Throat cancer Brother    Diabetes type II Brother    Heart disease Brother    Lung cancer Brother    Lung cancer Paternal Uncle    Lung cancer Daughter    Breast cancer Cousin      Past Surgical History:  Procedure Laterality Date   AMPUTATION Left 10/14/2018   Procedure: AMPUTATION LEFT LONG FINGER TIP;  Surgeon: Knute Neu, MD;  Location: [facility] OR;  Service: Plastics;  Laterality: Left;   ANTERIOR CERVICAL DECOMP/DISCECTOMY FUSION  09/27/2016   C5-6 anterior cervical discectomy and fusion, allograft and plate/notes 32/03/7123   ANTERIOR CERVICAL DECOMP/DISCECTOMY FUSION N/A 09/27/2016   Procedure: C5-6 Anterior Cervical Discectomy and Fusion, Allograft and Plate;  Surgeon: Eldred Manges, MD;  Location: [facility] OR;  Service: Orthopedics;  Laterality: N/A;   Back Fusion  2002   BIOPSY  07/19/2018   Procedure: BIOPSY;  Surgeon: Jeani Hawking, MD;  Location: [facility]  ENDOSCOPY;  Service: Endoscopy;;   BIOPSY  02/13/2019   Procedure: BIOPSY;  Surgeon: Jeani Hawking, MD;  Location: [facility] ENDOSCOPY;  Service: Endoscopy;;   CARPAL TUNNEL RELEASE  yates   left   COLONOSCOPY N/A 01/05/2014   Procedure: COLONOSCOPY;  Surgeon: Charna Elizabeth, MD;  Location: [facility] ENDOSCOPY;  Service: Endoscopy;  Laterality: N/A;   ELBOW SURGERY     age 56   ENTEROSCOPY N/A 02/13/2019   Procedure: ENTEROSCOPY;  Surgeon: Jeani Hawking, MD;  Location: [facility] ENDOSCOPY;  Service: Endoscopy;  Laterality: N/A;   ESOPHAGOGASTRODUODENOSCOPY (EGD) WITH PROPOFOL N/A 07/19/2018   Procedure: ESOPHAGOGASTRODUODENOSCOPY (EGD) WITH PROPOFOL;  Surgeon: Jeani Hawking, MD;  Location[facility] ENDOSCOPY;  Service: Endoscopy;  Laterality: N/A;   EXTERNAL EAR SURGERY Left    EYE SURGERY     "removed white dots under eyelid"   FINGER SURGERY Left    INJECTION KNEE Right 07/21/2022   Procedure: INTRA-ARTICULAR INJECTION UNDER ANESTHESIA;  Surgeon: Eldred Manges, MD;  Location: [facility] OR;  Service: Orthopedics;  Laterality: Right;   Juvara osteomy     KNEE SURGERY     NOSE SURGERY     Rt. toe bunion     skin, shave biopsy  05/03/2016   Left occipital scalp, top of scalp   TONSILLECTOMY     TOTAL HIP ARTHROPLASTY Right 12/17/2020   TOTAL HIP  ARTHROPLASTY Right 12/17/2020   Procedure: TOTAL HIP ARTHROPLASTY-DIRECT ANTERIOR;  Surgeon: Eldred Manges, MD;  Location: [facility] OR;  Service: Orthopedics;  Laterality: Right;  needs RNFA   TOTAL KNEE ARTHROPLASTY Left 07/21/2022   Procedure: LEFT TOTAL KNEE ARTHROPLASTY;  Surgeon: Eldred Manges, MD;  Location: [facility] OR;  Service: Orthopedics;  Laterality: Left;   UPPER EXTREMITY ANGIOGRAPHY Bilateral 10/11/2018   Procedure: UPPER EXTREMITY ANGIOGRAPHY;  Surgeon: Sherren Kerns, MD;  Location: [facility] INVASIVE CV LAB;  Service: Cardiovascular;  Laterality: Bilateral;   Social History   Occupational History   Not on file  Tobacco Use   Smoking status: Former    Packs/day: 2.00    Years: 30.00    Total pack years: 60.00    Types: Cigarettes    Quit date: 10/23/2001    Years since quitting: 20.7    Passive exposure: Past   Smokeless tobacco: Never  Vaping Use   Vaping Use: Never used  Substance and Sexual Activity   Alcohol use: No   Drug use: No   Sexual activity: Not on file    Exam Pleasant female alert and oriented in no acute distress.  Will looks good.  Staples intact.  No drainage or signs of infection.  Range of motion passively about 0 to 80 degrees.  Calf nontender

## 2022-07-28 NOTE — Telephone Encounter (Signed)
Patient called. She says the bandages are coming off. Need to know what to do? Her call back number is [phone]

## 2022-07-28 NOTE — Discharge Summary (Signed)
[name] MRN: [id number] DOB/AGE: [birth year] 70 y.o.  Admit date: 07/21/2022 Discharge date: 07/24/2022  Admission Diagnoses:  Principal Problem:   Arthritis of left knee   Discharge Diagnoses:  Principal Problem:   Arthritis of left knee  status post Procedure(s): LEFT TOTAL KNEE ARTHROPLASTY INTRA-ARTICULAR INJECTION UNDER ANESTHESIA  Past Medical History:  Diagnosis Date   Acute gastritis without bleeding 08/08/2018   Anemia    Anxiety    Asthma    Bipolar disorder ([place])    CANDIDIASIS, ESOPHAGEAL 07/28/2009   Qualifier: Diagnosis of  By: [name] MD, [name]    Chronic kidney disease    CKD III   Complication of anesthesia    was told she stopped breathing for one of her finger surgeries   COPD (chronic obstructive pulmonary disease) ([place])    Depression    Diabetes mellitus without complication (HCC)    no meds   Dyspnea    At rest,and with activity   FH: colonic polyps    Fractured elbow    right    GERD (gastroesophageal reflux disease)    Headache    migraines   HH (hiatus hernia)    History of carpal tunnel syndrome    History of chest pain    History of transfusion of packed red blood cells    Hyperlipidemia    Hypertension    Mitral valve prolapse    Neuroleptic-induced tardive dyskinesia    Osteoarthritis of more than one site    Pneumonia    Seasonal allergies    Stroke [facility])     Surgeries: Procedure(s): LEFT TOTAL KNEE ARTHROPLASTY INTRA-ARTICULAR INJECTION UNDER ANESTHESIA on 07/21/2022   Consultants:   Discharged Condition: Improved  Hospital Course: Desiree [name] is an 71 y.o. female who was admitted 07/21/2022 for operative treatment of Arthritis of left knee. Patient failed conservative treatments (please see the history and physical for the specifics) and had severe unremitting pain that affects sleep, daily activities and work/hobbies. After pre-op clearance, the patient was taken to the operating room on 07/21/2022 and  underwent  Procedure(s): LEFT TOTAL KNEE ARTHROPLASTY INTRA-ARTICULAR INJECTION UNDER ANESTHESIA.    Patient was given perioperative antibiotics:  Anti-infectives (From admission, onward)    Start     Dose/Rate Route Frequency Ordered Stop   07/21/22 1800  hydroxychloroquine (PLAQUENIL) tablet 200 mg  Status:  Discontinued        200 mg Oral Daily 07/21/22 1241 07/24/22 1923   07/21/22 0600  vancomycin (VANCOCIN) IVPB 1000 mg/200 mL premix        1,000 mg 200 mL/hr over 60 Minutes Intravenous On call to O.R. 07/21/22 0540 07/21/22 0746        Patient was given sequential compression devices and early ambulation to prevent DVT.   Patient benefited maximally from hospital stay and there were no complications. At the time of discharge, the patient was urinating/moving their bowels without difficulty, tolerating a regular diet, pain is controlled with oral pain medications and they have been cleared by PT/OT.   Recent vital signs: No data found.   Recent laboratory studies: No results for input(s): "WBC", "HGB", "HCT", "PLT", "NA", "K", "CL", "CO2", "BUN", "CREATININE", "GLUCOSE", "INR", "CALCIUM" in the last 72 hours.  Invalid input(s): "PT", "2"   Discharge Medications:   Allergies as of 07/24/2022       Reactions   Prednisone Shortness Of Breath, Itching, Nausea And Vomiting, Palpitations   Solu-medrol [methylprednisolone] Anaphylaxis   Amoxicillin Hives,  Rash   Has patient had a PCN reaction causing immediate rash, facial/tongue/throat swelling, SOB or lightheadedness with hypotension:Yes Has patient had a PCN reaction causing severe rash involving mucus membranes or skin necrosis: No Has patient had a PCN reaction that required hospitalization: No Has patient had a PCN reaction occurring within the last 10 years: No If all of the above answers are "NO", then may proceed with Cephalosporin use.   Codeine Hives, Itching, Rash   Tolerated oxycodone and morphine previously    Hydrocodone-acetaminophen Hives, Itching, Rash   Tolerated oxycodone and morphine previously   Penicillins Hives, Itching, Rash   ALLERGIC REACTION TO ORAL AMOXICILLIN Has patient had a PCN reaction causing immediate rash, facial/tongue/throat swelling, SOB or lightheadedness with hypotension: Yes Has patient had a PCN reaction causing severe rash involving mucus membranes or skin necrosis: No Has patient had a PCN reaction that required hospitalization: No Has patient had a PCN reaction occurring within the last 10 years: No If all of the above answers are "NO", then may proceed with Cephalosporin use.   Tape Other (See Comments)   sore        Medication List     STOP taking these medications    acetaminophen 500 MG tablet Commonly known as: TYLENOL       TAKE these medications    Accu-Chek Softclix Lancets lancets Use to test blood sugar daily. Dx:e11.9   TRUEplus Lancets 33G Misc Use as directed DX:11.9   albuterol 108 (90 Base) MCG/ACT inhaler Commonly known as: VENTOLIN HFA Inhale 2 puffs into the lungs every 6 (six) hours as needed for wheezing or shortness of breath.   amLODipine 5 MG tablet Commonly known as: NORVASC TAKE 1 TABLET EVERY MORNING   aspirin EC 325 MG tablet Take 1 tablet (325 mg total) by mouth daily. MUST TAKE AT LEAST 4 WEEKS POSTOP FOR DVT PROPHYLAXIS What changed:  medication strength how much to take additional instructions   azelastine 0.1 % nasal spray Commonly known as: ASTELIN USE 2 SPRAYS IN EACH NOSTRIL TWICE DAILY AS DIRECTED   Breztri Aerosphere 160-9-4.8 MCG/ACT Aero Generic drug: Budeson-Glycopyrrol-Formoterol Inhale 2 puffs into the lungs in the morning and at bedtime.   Centrum Silver 50+Women Tabs Take 1 tablet by mouth daily.   cetirizine 10 MG tablet Commonly known as: ZYRTEC Take 10 mg by mouth at bedtime.   diclofenac sodium 1 % Gel Commonly known as: VOLTAREN APPLY 2-4 GRAMS TO AFFECTED JOINTS UP TO FOUR  TIMES DAILY   DropSafe Alcohol Prep 70 % Pads USE AS DIRECTED   DULCOLAX 100 MG capsule Generic drug: docusate sodium Take 100 mg by mouth as needed for mild constipation or moderate constipation.   famotidine 40 MG tablet Commonly known as: PEPCID TAKE 1 TABLET EVERY DAY   feeding supplement Liqd Take 237 mLs by mouth 3 (three) times daily between meals. What changed:  when to take this additional instructions   fluticasone 50 MCG/ACT nasal spray Commonly known as: FLONASE Place 2 sprays into both nostrils at bedtime.   Flutter Devi 1 Device by Does not apply route as directed.   hydroxychloroquine 200 MG tablet Commonly known as: PLAQUENIL TAKE 1 TABLET EVERY DAY   methocarbamol 500 MG tablet Commonly known as: ROBAXIN Take 1 tablet (500 mg total) by mouth every 8 (eight) hours as needed for muscle spasms.   montelukast 10 MG tablet Commonly known as: SINGULAIR TAKE 1 TABLET AT BEDTIME   neomycin-bacitracin-polymyxin [id number] ointment Apply 1  Application topically daily as needed (on fingers).   ondansetron 4 MG disintegrating tablet Commonly known as: ZOFRAN-ODT DISSOLVE 1 TABLET ON THE TONGUE EVERY 8 HOURS AS NEEDED FOR NAUSEA OR VOMITING What changed:  how much to take how to take this when to take this additional instructions   oxyCODONE-acetaminophen 5-325 MG tablet Commonly known as: Percocet Take 1-2 tablets by mouth every 6 (six) hours as needed for severe pain.   pravastatin 40 MG tablet Commonly known as: PRAVACHOL TAKE 1 TABLET EVERY DAY   Theratears 0.25 % Soln Generic drug: Carboxymethylcellulose Sodium Place 1 drop into both eyes daily as needed (Dry eyes).   True Metrix Air Glucose Meter w/Device Kit Use as directed DX:11.9   True Metrix Blood Glucose Test test strip Generic drug: glucose blood USE AS INSTRUCTED        Diagnostic Studies: DG Knee Left Port  Result Date: 07/21/2022 CLINICAL DATA:  Postop EXAM: PORTABLE LEFT  KNEE - 1-2 VIEW COMPARISON:  Portable exam 1012 hours compared to 07/07/2022 FINDINGS: Interval LEFT knee arthroplasty. Components in expected positions. No acute fracture, dislocation, or bone destruction. Mild osseous demineralization seen. IMPRESSION: LEFT knee arthroplasty without acute complication. Electronically Signed   By: Lavonia Dana M.D.   On: 07/21/2022 10:31   XR Foot Complete Right  Result Date: 07/09/2022 Three-view x-rays right foot obtained which shows first metatarsal previous osteotomy with 2 screws.  Hammertoe deformity second toe without acute changes in the second toe. Impression: Right foot x-rays previous surgery negative for acute changes.  XR KNEE 3 VIEW LEFT  Result Date: 07/09/2022 Standing AP both knees lateral left knee patellofemoral x-rays obtained and reviewed.  This shows moderate to severe left knee osteoarthritis with loss of joint space marginal osteophytes flattening of the femoral condyle and subchondral sclerosis. Assessment left knee osteoarthritis tricompartmental.  XR KNEE 3 VIEW RIGHT  Result Date: 07/09/2022 Three-view x-rays right knee obtained and reviewed.  This shows moderate knee osteoarthritis.  Negative for acute changes.  Joint space narrowing marginal osteophytes. Impression: Moderate right knee osteoarthritis.      Follow-up Information     [name], MD. Schedule an appointment as soon as possible for a visit today.   Specialty: Orthopedic Surgery Why: NEED RETURN OFFICE VISIT WITH DR Lorin Mercy ONE WEEK POSTOP.  CALL TO SCHEDULE Contact information: [place] [address] [phone]         [facility] Follow up.   Specialty: [place] Why: Someone from the office will call to schedule home health visits Contact information: [place] [address] [phone]                 Discharge Plan:  discharge to home  Disposition:     Signed: Benjiman Core   07/28/2022, 2:26 PM

## 2022-07-28 NOTE — Telephone Encounter (Signed)
I left voicemail for return call. 

## 2022-07-29 LAB — CBC WITH DIFFERENTIAL/PLATELET
Absolute Monocytes: 989 cells/uL — ABNORMAL HIGH (ref 200–950)
Basophils Absolute: 58 cells/uL (ref 0–200)
Basophils Relative: 0.6 %
Eosinophils Absolute: 461 cells/uL (ref 15–500)
Eosinophils Relative: 4.8 %
HCT: 30 % — ABNORMAL LOW (ref 35.0–45.0)
Hemoglobin: 9.8 g/dL — ABNORMAL LOW (ref 11.7–15.5)
Lymphs Abs: 2554 cells/uL (ref 850–3900)
MCH: 30 pg (ref 27.0–33.0)
MCHC: 32.7 g/dL (ref 32.0–36.0)
MCV: 91.7 fL (ref 80.0–100.0)
MPV: 10.2 fL (ref 7.5–12.5)
Monocytes Relative: 10.3 %
Neutro Abs: 5539 cells/uL (ref 1500–7800)
Neutrophils Relative %: 57.7 %
Platelets: 324 10*3/uL (ref 140–400)
RBC: 3.27 10*6/uL — ABNORMAL LOW (ref 3.80–5.10)
RDW: 13.1 % (ref 11.0–15.0)
Total Lymphocyte: 26.6 %
WBC: 9.6 10*3/uL (ref 3.8–10.8)

## 2022-07-29 LAB — BASIC METABOLIC PANEL
BUN/Creatinine Ratio: 20 (calc) (ref 6–22)
BUN: 38 mg/dL — ABNORMAL HIGH (ref 7–25)
CO2: 25 mmol/L (ref 20–32)
Calcium: 9.1 mg/dL (ref 8.6–10.4)
Chloride: 106 mmol/L (ref 98–110)
Creat: 1.87 mg/dL — ABNORMAL HIGH (ref 0.60–1.00)
Glucose, Bld: 88 mg/dL (ref 65–99)
Potassium: 4.9 mmol/L (ref 3.5–5.3)
Sodium: 142 mmol/L (ref 135–146)

## 2022-08-01 ENCOUNTER — Ambulatory Visit: Payer: Medicare HMO | Admitting: Pulmonary Disease

## 2022-08-04 ENCOUNTER — Ambulatory Visit (INDEPENDENT_AMBULATORY_CARE_PROVIDER_SITE_OTHER): Payer: Medicare HMO | Admitting: Orthopaedic Surgery

## 2022-08-04 ENCOUNTER — Encounter: Payer: Self-pay | Admitting: Orthopaedic Surgery

## 2022-08-04 DIAGNOSIS — Z96652 Presence of left artificial knee joint: Secondary | ICD-10-CM | POA: Insufficient documentation

## 2022-08-04 DIAGNOSIS — R6889 Other general symptoms and signs: Secondary | ICD-10-CM | POA: Diagnosis not present

## 2022-08-04 MED ORDER — METHOCARBAMOL 500 MG PO TABS: 500.0000 mg | ORAL_TABLET | Freq: Three times a day (TID) | ORAL | 0 refills | Status: DC | PRN

## 2022-08-04 MED ORDER — TRAMADOL HCL 50 MG PO TABS: 50.0000 mg | ORAL_TABLET | Freq: Four times a day (QID) | ORAL | 0 refills | Status: DC | PRN

## 2022-08-04 NOTE — Progress Notes (Signed)
Post-Op Visit Note   Patient: Desiree [name]           Date of Birth: 01-15-1951           MRN: [id number] Visit Date: 08/04/2022 PCP: [name], MD   Assessment & Plan: Postop knee arthroplasty left.  Range of motion is 4 degrees to 70 degrees.  She starts outpatient therapy Monday.  Ultram Robaxin renewed.  Recheck 1 month. Chief Complaint:  Chief Complaint  Patient presents with   Left Knee - Routine Post Op   Visit Diagnoses:  1. Hx of total knee replacement, left     Plan: ROV one month  Follow-Up Instructions: No follow-ups on file.   Orders:  No orders of the defined types were placed in this encounter.  Meds ordered this encounter  Medications   traMADol (ULTRAM) 50 MG tablet    Sig: Take 1 tablet (50 mg total) by mouth every 6 (six) hours as needed.    Dispense:  20 tablet    Refill:  0   methocarbamol (ROBAXIN) 500 MG tablet    Sig: Take 1 tablet (500 mg total) by mouth every 8 (eight) hours as needed for muscle spasms.    Dispense:  40 tablet    Refill:  0    Imaging: No results found.  PMFS History: Patient Active Problem List   Diagnosis Date Noted   Hx of total knee replacement, left 08/04/2022   Arthritis of left knee 07/21/2022   Hypertrophic cardiomyopathy ([place]) 07/10/2022   Insomnia secondary to chronic pain 12/27/2020   Insomnia due to other mental disorder 12/27/2020   Snoring 12/27/2020   Inadequate sleep hygiene 12/27/2020   S/P total right hip arthroplasty 12/18/2020   Arthritis of right hip 12/17/2020   Encounter for preoperative assessment 12/10/2020   Infection of nail bed of finger of right hand 09/15/2020   Pain in right finger(s) 08/31/2020   Ganglion of right wrist 08/31/2020   COPD mixed type ([place]) 12/26/2018   Healthcare maintenance 12/26/2018   History of fall 11/05/2018   Weight loss 11/05/2018   Weakness 11/05/2018   Gangrene of finger ([place]) 10/09/2018   Raynaud's phenomenon with gangrene ([place]) 10/09/2018   LVH  (left ventricular hypertrophy) 10/09/2018   Vasospasm ([place]) 09/06/2018   Hypotension 08/08/2018   Leucocytosis 08/08/2018   Elevated troponin I level 08/08/2018   Nausea and vomiting 08/06/2018   Pneumonia 07/12/2018   COPD exacerbation ([place]) 07/11/2018   Primary osteoarthritis of both feet 12/21/2017   DDD (degenerative disc disease), lumbar 12/21/2017   Primary osteoarthritis of both knees 12/21/2017   History of bilateral carpal tunnel release 12/21/2017   DDD (degenerative disc disease), cervical 11/15/2017   Former smoker 11/15/2017   Bronchiectasis without complication ([place]) [id number]   Impingement syndrome of right shoulder 07/25/2017   Hx of fusion of cervical spine 07/25/2017   Cervical spinal stenosis 09/27/2016   Polypharmacy 06/23/2015   Hyperlipidemia 04/19/2015   Visit for preventive health examination 01/01/2015   Primary osteoarthritis involving multiple joints 01/01/2015   Bipolar affective disorder, currently depressed, moderate ([place])    Bipolar I disorder with mania ([place]) 08/17/2014   Hypertension 10/21/2013   Dizziness 03/25/2013   Medication withdrawal ([place]) 03/05/2013   Diabetes mellitus with renal manifestations, controlled ([place]) 11/10/2012   Alopecia areata 02/06/2012   Obesity (BMI 30-39.9) 02/06/2012   Renal insufficiency 07/16/2011   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    Exertional dyspnea 02/07/2011   Dyspnea on  exertion 01/19/2011   DM (diabetes mellitus), type 2 ([place]) 04/22/2010   Carpal tunnel syndrome 02/02/2010   CONSTIPATION 02/02/2010   Primary osteoarthritis of both hands 02/02/2010   CATARACTS 04/13/2009   PAIN IN JOINT, ANKLE AND FOOT 09/16/2008   Eosinophilia 07/21/2008   AFFECTIVE DISORDER 04/21/2008   BACK PAIN, CHRONIC 02/12/2008   LEG PAIN 02/12/2008   ABNORMAL INVOLUNTARY MOVEMENTS 12/04/2007   POSTURAL LIGHTHEADEDNESS 11/04/2007   COLONIC POLYPS, HX OF 11/04/2007   Essential hypertension 06/17/2007   HYPERLIPIDEMIA 04/08/2007    GERD 04/08/2007   LOW BACK PAIN SYNDROME 04/08/2007   CHEST PAIN, RECURRENT 04/08/2007   Past Medical History:  Diagnosis Date   Acute gastritis without bleeding 08/08/2018   Anemia    Anxiety    Asthma    Bipolar disorder ([place])    CANDIDIASIS, ESOPHAGEAL 07/28/2009   Qualifier: Diagnosis of  By: [name] MD, [name]    Chronic kidney disease    CKD III   Complication of anesthesia    was told she stopped breathing for one of her finger surgeries   COPD (chronic obstructive pulmonary disease) (HCC)    Depression    Diabetes mellitus without complication (H[facility])    no meds   Dyspnea    At rest,and with activity   FH: colonic polyps    Fractured elbow    right    GERD (gastroesophageal reflux disease)    Headache    migraines   HH (hiatus hernia)    History of carpal tunnel syndrome    History of chest pain    History of transfusion of packed red blood cells    Hyperlipidemia    Hypertension    Mitral valve prolapse    Neuroleptic-induced tardive dyskinesia    Osteoarthritis of more than one site    Pneumonia    Seasonal allergies    Stroke [facility])     Family History  Problem Relation Age of Onset   Diabetes Mother    Hypertension Mother    Heart attack Mother    Heart attack Father    Heart disease Father    Throat cancer Brother    Diabetes type II Brother    Heart disease Brother    Lung cancer Brother    Lung cancer Paternal Uncle    Lung cancer Daughter    Breast cancer Cousin     Past Surgical History:  Procedure Laterality Date   AMPUTATION Left 10/14/2018   Procedure: AMPUTATION LEFT LONG FINGER TIP;  Surgeon: Dayna Barker, MD;  Location: [place];  Service: Plastics;  Laterality: Left;   ANTERIOR CERVICAL DECOMP/DISCECTOMY FUSION  09/27/2016   C5-6 anterior cervical discectomy and fusion, allograft and plate/notes 09/27/2016   ANTERIOR CERVICAL DECOMP/DISCECTOMY FUSION N/A 09/27/2016   Procedure: C5-6 Anterior Cervical Discectomy and Fusion, Allograft  and Plate;  Surgeon: [name], MD;  Location: [place];  Service: Orthopedics;  Laterality: N/A;   Back Fusion  2002   BIOPSY  07/19/2018   Procedure: BIOPSY;  Surgeon: [name], MD;  Location: [facility] END[facility]COPY;  Service: Endoscopy;;   BIOPSY  02/13/2019   Procedure: BIOPSY;  Surgeon: [name], MD;  Location: [facility] ENDOSCOPY;  Service: Endoscopy;;   CARPAL TUNNEL RELEASE  Cledith Kamiya   left   COLONOSCOPY N/A 01/05/2014   Procedure: COLONOSCOPY;  Surgeon: [name], MD;  Location: [facility];  Service: Endoscopy;  Laterality: N/A;   ELBOW SURGERY     age 5   ENTEROSCOPY N/A 02/13/2019  Procedure: ENTEROSCOPY;  Surgeon: [name], MD;  Location: [facility];  Service: Endoscopy;  Laterality: N/A;   ESOPHAGOGASTRODUODENOSCOPY (EGD) WITH PROPOFOL N/A 07/19/2018   Procedure: ESOPHAGOGASTRODUODENOSCOPY (EGD) WITH PROPOFOL;  Surgeon: [name], MD;  Location: [place];  Service: Endoscopy;  Laterality: N/A;   EXTERNAL EAR SURGERY Left    EYE SURGERY     "removed white dots under eyelid"   FINGER SURGERY Left    INJECTION KNEE Right 07/21/2022   Procedure: INTRA-ARTICULAR INJECTION UNDER ANESTHESIA;  Surgeon: [name], MD;  Location: [place];  Service: Orthopedics;  Laterality: Right;   [place] osteomy     KNEE SURGERY     NOSE SURGERY     Rt. toe bunion     skin, shave biopsy  05/03/2016   Left occipital scalp, top of scalp   TONSILLECTOMY     TOTAL HIP ARTHROPLASTY Right 12/17/2020   TOTAL HIP ARTHROPLASTY Right 12/17/2020   Procedure: TOTAL HIP ARTHROPLASTY-DIRECT ANTERIOR;  Surgeon: [name], MD;  Location: [place];  Service: Orthopedics;  Laterality: Right;  needs RNFA   TOTAL KNEE ARTHROPLASTY Left 07/21/2022   Procedure: LEFT TOTAL KNEE ARTHROPLASTY;  Surgeon: [name], MD;  Location: [place];  Service: Orthopedics;  Laterality: Left;   UPPER EXTREMITY ANGIOGRAPHY Bilateral 10/11/2018   Procedure: UPPER EXTREMITY ANGIOGRAPHY;  Surgeon: [name], MD;   Location: [place] [facility];  Service: Cardiovascular;  Laterality: Bilateral;   Social History   Occupational History   Not on file  Tobacco Use   Smoking status: Former    Packs/day: 2.00    Years: 30.00    Total pack years: 60.00    Types: Cigarettes    Quit date: 10/23/2001    Years since quitting: 20.7    Passive exposure: Past   Smokeless tobacco: Never  Vaping Use   Vaping Use: Never used  Substance and Sexual Activity   Alcohol use: No   Drug use: No   Sexual activity: Not on file

## 2022-08-07 ENCOUNTER — Ambulatory Visit: Payer: Medicare HMO | Admitting: Physical Therapy

## 2022-08-07 DIAGNOSIS — R6889 Other general symptoms and signs: Secondary | ICD-10-CM | POA: Diagnosis not present

## 2022-08-08 ENCOUNTER — Ambulatory Visit: Payer: Medicare HMO | Admitting: Physical Therapy

## 2022-08-08 ENCOUNTER — Encounter: Payer: Self-pay | Admitting: Physical Therapy

## 2022-08-08 ENCOUNTER — Telehealth: Payer: Self-pay | Admitting: *Deleted

## 2022-08-08 DIAGNOSIS — M25662 Stiffness of left knee, not elsewhere classified: Secondary | ICD-10-CM | POA: Diagnosis not present

## 2022-08-08 DIAGNOSIS — M6281 Muscle weakness (generalized): Secondary | ICD-10-CM

## 2022-08-08 DIAGNOSIS — R262 Difficulty in walking, not elsewhere classified: Secondary | ICD-10-CM

## 2022-08-08 DIAGNOSIS — R600 Localized edema: Secondary | ICD-10-CM | POA: Diagnosis not present

## 2022-08-08 DIAGNOSIS — M25562 Pain in left knee: Secondary | ICD-10-CM | POA: Diagnosis not present

## 2022-08-08 NOTE — Telephone Encounter (Signed)
Ortho bundle 14 day in person meeting completed 08/04/22. (Late entry).

## 2022-08-08 NOTE — Therapy (Signed)
OUTPATIENT PHYSICAL THERAPY LOWER EXTREMITY EVALUATION   Patient Name: Desiree [name] MRN: [id number] DOB:[birth year], 71 y.o., female Today's Date: 08/08/2022  PT End of Session - 08/08/22 1136     Visit Number 1    Number of Visits 24    Date for PT Re-Evaluation 11/03/22    Authorization Type human requested 12 visit    Authorization - Visit Number 1    Authorization - Number of Visits 12    Progress Note Due on Visit 10    PT Start Time 1100    PT Stop Time 1145    PT Time Calculation (min) 45 min    Activity Tolerance Patient tolerated treatment well;Patient limited by pain    Behavior During Therapy [facility] for tasks assessed/performed              PT End of Session - 08/08/22 1136     Visit Number 1    Number of Visits 24    Date for PT Re-Evaluation 11/03/22    Authorization Type human requested 12 visit    Authorization - Visit Number 1    Authorization - Number of Visits 12    Progress Note Due on Visit 10    PT Start Time 1100    PT Stop Time 1145    PT Time Calculation (min) 45 min    Activity Tolerance Patient tolerated treatment well;Patient limited by pain    Behavior During Therapy [facility] for tasks assessed/performed             Past Medical History:  Diagnosis Date   Acute gastritis without bleeding 08/08/2018   Anemia    Anxiety    Asthma    Bipolar disorder ([place])    CANDIDIASIS, ESOPHAGEAL 07/28/2009   Qualifier: Diagnosis of  By: [name] MD, [name]    Chronic kidney disease    CKD III   Complication of anesthesia    was told she stopped breathing for one of her finger surgeries   COPD (chronic obstructive pulmonary disease) ([place])    Depression    Diabetes mellitus without complication ([place])    no meds   Dyspnea    At rest,and with activity   FH: colonic polyps    Fractured elbow    right    GERD (gastroesophageal reflux disease)    Headache    migraines   HH (hiatus hernia)    History of carpal tunnel syndrome    History of chest  pain    History of transfusion of packed red blood cells    Hyperlipidemia    Hypertension    Mitral valve prolapse    Neuroleptic-induced tardive dyskinesia    Osteoarthritis of more than one site    Pneumonia    Seasonal allergies    Stroke [facility])    Past Surgical History:  Procedure Laterality Date   AMPUTATION Left 10/14/2018   Procedure: AMPUTATION LEFT LONG FINGER TIP;  Surgeon: Dayna Barker, MD;  Location: [place];  Service: Plastics;  Laterality: Left;   ANTERIOR CERVICAL DECOMP/DISCECTOMY FUSION  09/27/2016   C5-6 anterior cervical discectomy and fusion, allograft and plate/notes 09/27/2016   ANTERIOR CERVICAL DECOMP/DISCECTOMY FUSION N/A 09/27/2016   Procedure: C5-6 Anterior Cervical Discectomy and Fusion, Allograft and Plate;  Surgeon: [name], MD;  Location: [place];  Service: Orthopedics;  Laterality: N/A;   Back Fusion  2002   BIOPSY  07/19/2018   Procedure: BIOPSY;  Surgeon: [name], MD;  Location: [place];  Service: Endoscopy;;   BIOPSY  02/13/2019   Procedure: BIOPSY;  Surgeon: Jeani Hawking, MD;  Location: [facility] ENDOSCOPY;  Service: Endoscopy;;   CARPAL TUNNEL RELEASE  yates   left   COLONOSCOPY N/A 01/05/2014   Procedure: COLONOSCOPY;  Surgeon: Charna Elizabeth, MD;  Location: [facility] ENDOSCOPY;  Service: Endoscopy;  Laterality: N/A;   ELBOW SURGERY     age 13   ENTEROSCOPY N/A 02/13/2019   Procedure: ENTEROSCOPY;  Surgeon: Jeani Hawking, MD;  Location: [facility] ENDOSCOPY;  Service: Endoscopy;  Laterality: N/A;   ESOPHAGOGASTRODUODENOSCOPY (EGD) WITH PROPOFOL N/A 07/19/2018   Procedure: ESOPHAGOGASTRODUODENOSCOPY (EGD) WITH PROPOFOL;  Surgeon: Jeani Hawking, MD;  Location: [facility] ENDOSCOPY;  Service: Endoscopy;  Laterality: N/A;   EXTERNAL EAR SURGERY Left    EYE SURGERY     "removed white dots under eyelid"   FINGER SURGERY Left    INJECTION KNEE Right 07/21/2022   Procedure: INTRA-ARTICULAR INJECTION UNDER ANESTHESIA;  Surgeon: Eldred Manges, MD;  Location: [facility] OR;  Service:  Orthopedics;  Laterality: Right;   Juvara osteomy     KNEE SURGERY     NOSE SURGERY     Rt. toe bunion     skin, shave biopsy  05/03/2016   Left occipital scalp, top of scalp   TONSILLECTOMY     TOTAL HIP ARTHROPLASTY Right 12/17/2020   TOTAL HIP ARTHROPLASTY Right 12/17/2020   Procedure: TOTAL HIP ARTHROPLASTY-DIRECT ANTERIOR;  Surgeon: Eldred Manges, MD;  Location: [facility] OR;  Service: Orthopedics;  Laterality: Right;  needs RNFA   TOTAL KNEE ARTHROPLASTY Left 07/21/2022   Procedure: LEFT TOTAL KNEE ARTHROPLASTY;  Surgeon: Eldred Manges, MD;  Location: [facility] OR;  Service: Orthopedics;  Laterality: Left;   UPPER EXTREMITY ANGIOGRAPHY Bilateral 10/11/2018   Procedure: UPPER EXTREMITY ANGIOGRAPHY;  Surgeon: Sherren Kerns, MD;  Location: [facility] INVASIVE CV LAB;  Service: Cardiovascular;  Laterality: Bilateral;   Patient Active Problem List   Diagnosis Date Noted   Hx of total knee replacement, left 08/04/2022   Arthritis of left knee 07/21/2022   Hypertrophic cardiomyopathy (HCC) 07/10/2022   Insomnia secondary to chronic pain 12/27/2020   Insomnia due to other mental disorder 12/27/2020   Snoring 12/27/2020   Inadequate sleep hygiene 12/27/2020   S/P total right hip arthroplasty 12/18/2020   Arthritis of right hip 12/17/2020   Encounter for preoperative assessment 12/10/2020   Infection of nail bed of finger of right hand 09/15/2020   Pain in right finger(s) 08/31/2020   Ganglion of right wrist 08/31/2020   COPD mixed type (HCC) 12/26/2018   Healthcare maintenance 12/26/2018   History of fall 11/05/2018   Weight loss 11/05/2018   Weakness 11/05/2018   Gangrene of finger (HCC) 10/09/2018   Raynaud's phenomenon with gangrene (HCC) 10/09/2018   LVH (left ventricular hypertrophy) 10/09/2018   Vasospasm (HCC) 09/06/2018   Hypotension 08/08/2018   Leucocytosis 08/08/2018   Elevated troponin I level 08/08/2018   Nausea and vomiting 08/06/2018   Pneumonia 07/12/2018   COPD exacerbation  (HCC) 07/11/2018   Primary osteoarthritis of both feet 12/21/2017   DDD (degenerative disc disease), lumbar 12/21/2017   Primary osteoarthritis of both knees 12/21/2017   History of bilateral carpal tunnel release 12/21/2017   DDD (degenerative disc disease), cervical 11/15/2017   Former smoker 11/15/2017   Bronchiectasis without complication (HCC) 10/25/2017   Impingement syndrome of right shoulder 07/25/2017   Hx of fusion of cervical spine 07/25/2017   Cervical spinal stenosis 09/27/2016   Polypharmacy 06/23/2015   Hyperlipidemia  04/19/2015   Visit for preventive health examination 01/01/2015   Primary osteoarthritis involving multiple joints 01/01/2015   Bipolar affective disorder, currently depressed, moderate (HCC)    Bipolar I disorder with mania (HCC) 08/17/2014   Hypertension 10/21/2013   Dizziness 03/25/2013   Medication withdrawal (HCC) 03/05/2013   Diabetes mellitus with renal manifestations, controlled (HCC) 11/10/2012   Alopecia areata 02/06/2012   Obesity (BMI 30-39.9) 02/06/2012   Renal insufficiency 07/16/2011   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    Exertional dyspnea 02/07/2011   Dyspnea on exertion 01/19/2011   DM (diabetes mellitus), type 2 (HCC) 04/22/2010   Carpal tunnel syndrome 02/02/2010   CONSTIPATION 02/02/2010   Primary osteoarthritis of both hands 02/02/2010   CATARACTS 04/13/2009   PAIN IN JOINT, ANKLE AND FOOT 09/16/2008   Eosinophilia 07/21/2008   AFFECTIVE DISORDER 04/21/2008   BACK PAIN, CHRONIC 02/12/2008   LEG PAIN 02/12/2008   ABNORMAL INVOLUNTARY MOVEMENTS 12/04/2007   POSTURAL LIGHTHEADEDNESS 11/04/2007   COLONIC POLYPS, HX OF 11/04/2007   Essential hypertension 06/17/2007   HYPERLIPIDEMIA 04/08/2007   GERD 04/08/2007   LOW BACK PAIN SYNDROME 04/08/2007   CHEST PAIN, RECURRENT 04/08/2007    PCP: Madelin Headings, MD  REFERRING PROVIDER: Eldred Manges MD  REFERRING DIAG: M17.12 (ICD-10-CM) - Unilateral primary osteoarthritis, left  knee  THERAPY DIAG:  Acute pain of left knee  Stiffness of left knee, not elsewhere classified  Difficulty in walking, not elsewhere classified  Localized edema  Muscle weakness (generalized)  Rationale for Evaluation and Treatment Rehabilitation  ONSET DATE: 07/21/22  SUBJECTIVE:   SUBJECTIVE STATEMENT: Pt s/p Left TKA on 07/21/22. Pt stating pain is currently 6/10 in her left knee. Pt with steri-strips intact.   PERTINENT HISTORY: Lt TKA 07/21/22 PMH: COPD,lumbar DDD and fusion L4-5 2002,,cerv fusion 2017, Rt THA, DM  PAIN:  NPRS scale: 6/10 Pain location: left knee Pain description: achy, throbbing Aggravating factors: sleeping, not moving, flexing Relieving factors: pain meds, ice, elevation  PRECAUTIONS: None  WEIGHT BEARING RESTRICTIONS No  FALLS:  Has patient fallen in last 6 months? No  LIVING ENVIRONMENT: Lives with: lives with their family Lives in: House/apartment Stairs: No Has following equipment at home: [name] - 4 wheeled, RW, st cane, grab bars in bathroom. Raised toilet seat,  Pt stating her shower chair doesn't  fit properly.   OCCUPATION: retired  PLOF: Independent  PATIENT GOALS walk without walker, stop hurting   OBJECTIVE:   DIAGNOSTIC FINDINGS:   07/21/22 IMPRESSION: LEFT knee arthroplasty without acute complication.  PATIENT SURVEYS:  08/08/22: FOTO intake: 51%  predicted:  55%  COGNITION:  08/08/22 Overall cognitive status: WFL    SENSATION: 08/08/22: WFL  EDEMA:  08/08/22 Rt knee circumferential: 37 centimeters       Left knee circumferential: 45 centimeters  MUSCLE LENGTH: Hamstrings: Right 50 deg; Left 68 deg   POSTURE: rounded shoulders and forward head  PALPATION: TTP: lateral joint line and around incision site, pt also   LOWER EXTREMITY STRENGTH:  MMT Right 08/08/22 Left 08/08/22  Hip flexion 5/5 4/5  Hip extension    Hip abduction    Hip adduction    Hip internal rotation    Hip external rotation     Knee flexion 5/5 3/5  Knee extension 5/5 3/5  Ankle dorsiflexion 5/5 5/5  Ankle plantarflexion 5/5 5/5  Ankle inversion    Ankle eversion     (Blank rows = not tested)  LOWER EXTREMITY ROM:  ROM A=active P=passive Right 08/08/22  supine Left 08/08/22 supine  Hip flexion    Hip extension    Knee flexion A: 128  A: 70 P: 80   Knee extension A: 0  A: -8 P: -5   (Blank rows = not tested)    FUNCTIONAL TESTS:  08/08/22  GAIT: Distance walked: 50 feet  Assistive device utilized: Walker - 4 wheeled Level of assistance: Modified independence Comments: antalgic gait pattern   TODAY'S TREATMENT: 08/08/22:  Therex:    HEP instruction/performance c cues for techniques, handout provided.  Trial set performed of each for comprehension and symptom assessment.  See below for exercise list    PATIENT EDUCATION:  Education details: HEP, POC Person educated: Patient Education method: Explanation, Demonstration, Verbal cues, and Handouts Education comprehension: verbalized understanding, returned demonstration, and verbal cues required    HOME EXERCISE PROGRAM: Access Code: [email or web address] URL: [email or web address] Date: 08/08/2022 Prepared by: Narda Amber  Exercises - Supine Quad Set  - 3-4 x daily - 7 x weekly - 2 sets - 10 reps - Seated Long Arc Quad  - 3-4 x daily - 7 x weekly - 2 sets - 10 reps - 3-5 seconds hold - Sit to Stand with Counter Support  - 3-4 x daily - 7 x weekly - 2 sets - 10 reps - Seated Hamstring Stretch  - 3-4 x daily - 7 x weekly - 3 reps - 30 seconds hold - Supine Heel Slide with Strap  - 3-4 x daily - 7 x weekly - 10 reps - 5-10 seconds hold  ASSESSMENT:  CLINICAL IMPRESSION: Patient is a 71 y.o. who comes to clinic with complaints of Left knee pain s/p left TKA on 07/21/22 with mobility, strength and movement coordination deficits that impair their ability to perform usual daily and recreational functional activities without  increase difficulty/symptoms at this time.  Patient to benefit from skilled PT services to address impairments and limitations to improve to previous level of function without restriction secondary to condition.     OBJECTIVE IMPAIRMENTS Abnormal gait, decreased balance, decreased mobility, difficulty walking, decreased ROM, decreased strength, increased edema, and pain.   ACTIVITY LIMITATIONS bending, sitting, standing, sleeping, stairs, transfers, bed mobility, toileting, and dressing  PARTICIPATION LIMITATIONS: cleaning, laundry, shopping, and community activity  PERSONAL FACTORS 3+ comorbidities: see above  are also affecting patient's functional outcome.   REHAB POTENTIAL: Good  CLINICAL DECISION MAKING: Stable/uncomplicated  EVALUATION COMPLEXITY: Low   GOALS: Goals reviewed with patient? Yes  Short term PT Goals (target date for Short term goals are 3 weeks 09/01/22) Patient will demonstrate independent use of home exercise program to maintain progress from in clinic treatments. Goal status: New  Pt will be able to improve her left knee flexion to >/= 90 degrees actively Goal Status: New   Long term PT goals (target dates for all long term goals are 12 weeks  11/03/22 )   1. Patient will demonstrate/report pain at worst less than or equal to 2/10 to facilitate minimal limitation in daily activity secondary to pain symptoms. Goal status: New   2. Patient will demonstrate independent use of home exercise program to facilitate ability to maintain/progress functional gains from skilled physical therapy services. Goal status: New   3. Patient will demonstrate FOTO outcome > or = 55 % to indicate reduced disability due to condition. Goal status: New   4.  Patient will demonstrate left LE MMT 5/5 throughout to faciltiate usual transfers, stairs, squatting at [facility] for daily life.   Goal status:  New   5.  Pt will improve her 5 time sit to stand to </= 15 seconds c/s UE  support.    Goal status: New   6.  Pt will be able to navigate curb step with/without assistive device safely.   Goal status: New   7. Pt will improve her left knee flexion to >/= 115 degrees for improved functional mobility and safely.    A. Goal Status: New       PLAN:  PT FREQUENCY: 2x-3/week  PT DURATION: 12 weeks  PLANNED INTERVENTIONS: Therapeutic exercises, Therapeutic activity, Neuro Muscular re-education, Balance training, Gait training, Patient/Family education, Joint mobilization, Stair training, DME instructions, Dry Needling, Electrical stimulation, Traction, Cryotherapy, Moist heat, Taping, Ultrasound, Ionotophoresis 4mg /ml Dexamethasone, and Manual therapy.  All included unless contraindicated   PLAN FOR NEXT SESSION: Review HEP knowledge/results, Nustep, Knee ROM, LE strengthening, vasopneumatic    , PT, MPT 08/08/2022, 11:37 AM Referring diagnosis? M17.12 (ICD-10-CM) - Unilateral primary osteoarthritis, left knee Treatment diagnosis? (if different than referring diagnosis) M25.562, M25.662 ,R26,2, R60.0, M62.81 What was this (referring dx) caused by? [x]  Surgery []  Fall []  Ongoing issue []  Arthritis []  Other: ____________  Laterality: []  Rt [x]  Lt []  Both  Check all possible CPT codes:  *CHOOSE 10 OR LESS*    [x]  08/10/2022 (Therapeutic Exercise)  []  92507 (SLP Treatment)  [x]  97112 (Neuro Re-ed)   []  92526 (Swallowing Treatment)   [x]  (Gait Training)   []  (Cognitive Training, 1st 15 minutes) [x]  97140 (Manual Therapy)   []  97130 (Cognitive Training, each add'l 15 minutes)  []  97164 (Re-evaluation)                              []  Other, List CPT Code ____________  [x]  97530 (Therapeutic Activities)     [x]  97535 (Self Care)   []  All codes above (97110 - 97535)  []  97012 (Mechanical Traction)  [x]  97014 (E-stim Unattended)  []  97032 (E-stim manual)  []  97033 (Ionto)  []  97035 (Ultrasound) []  97750 (Physical Performance  Training) []  (Aquatic Therapy) [x]  97016 (Vasopneumatic Device) []  (Paraffin) []  97034 (Contrast Bath) []  97597 (Wound Care 1st 20 sq cm) []  97598 (Wound Care each add'l 20 sq cm) []  97760 (Orthotic Fabrication, Fitting, Training Initial) []  (Prosthetic Management and Training Initial) []  (Orthotic or Prosthetic Training/ Modification Subsequent)

## 2022-08-09 ENCOUNTER — Encounter: Payer: Self-pay | Admitting: Rehabilitative and Restorative Service Providers"

## 2022-08-09 ENCOUNTER — Ambulatory Visit (INDEPENDENT_AMBULATORY_CARE_PROVIDER_SITE_OTHER): Payer: Medicare HMO | Admitting: Rehabilitative and Restorative Service Providers"

## 2022-08-09 DIAGNOSIS — M6281 Muscle weakness (generalized): Secondary | ICD-10-CM | POA: Diagnosis not present

## 2022-08-09 DIAGNOSIS — M25662 Stiffness of left knee, not elsewhere classified: Secondary | ICD-10-CM

## 2022-08-09 DIAGNOSIS — R600 Localized edema: Secondary | ICD-10-CM

## 2022-08-09 DIAGNOSIS — M25562 Pain in left knee: Secondary | ICD-10-CM

## 2022-08-09 DIAGNOSIS — R262 Difficulty in walking, not elsewhere classified: Secondary | ICD-10-CM

## 2022-08-09 NOTE — Therapy (Signed)
OUTPATIENT PHYSICAL THERAPY TREATMENT NOTE   Patient Name: Desiree [name] MRN: [id number] DOB:1950/12/03, 71 y.o., female Today's Date: 08/09/2022  END OF SESSION:   PT End of Session - 08/09/22 1738     Visit Number 2    Number of Visits 24    Date for PT Re-Evaluation 11/03/22    Authorization Type humana requested 12 visit    Authorization - Visit Number 2    Authorization - Number of Visits 12    Progress Note Due on Visit 10    PT Start Time 1430    PT Stop Time 1525    PT Time Calculation (min) 55 min    Activity Tolerance Patient tolerated treatment well;Patient limited by pain;No increased pain    Behavior During Therapy WFL for tasks assessed/performed             Past Medical History:  Diagnosis Date   Acute gastritis without bleeding 08/08/2018   Anemia    Anxiety    Asthma    Bipolar disorder (HCC)    CANDIDIASIS, ESOPHAGEAL 07/28/2009   Qualifier: Diagnosis of  By: Fabian Sharp MD, Neta Mends    Chronic kidney disease    CKD III   Complication of anesthesia    was told she stopped breathing for one of her finger surgeries   COPD (chronic obstructive pulmonary disease) (HCC)    Depression    Diabetes mellitus without complication (HCC)    no meds   Dyspnea    At rest,and with activity   FH: colonic polyps    Fractured elbow    right    GERD (gastroesophageal reflux disease)    Headache    migraines   HH (hiatus hernia)    History of carpal tunnel syndrome    History of chest pain    History of transfusion of packed red blood cells    Hyperlipidemia    Hypertension    Mitral valve prolapse    Neuroleptic-induced tardive dyskinesia    Osteoarthritis of more than one site    Pneumonia    Seasonal allergies    Stroke [facility])    Past Surgical History:  Procedure Laterality Date   AMPUTATION Left 10/14/2018   Procedure: AMPUTATION LEFT LONG FINGER TIP;  Surgeon: Knute Neu, MD;  Location: [facility] OR;  Service: Plastics;  Laterality: Left;   ANTERIOR  CERVICAL DECOMP/DISCECTOMY FUSION  09/27/2016   C5-6 anterior cervical discectomy and fusion, allograft and plate/notes 21/12/863   ANTERIOR CERVICAL DECOMP/DISCECTOMY FUSION N/A 09/27/2016   Procedure: C5-6 Anterior Cervical Discectomy and Fusion, Allograft and Plate;  Surgeon: Eldred Manges, MD;  Location: [facility] OR;  Service: Orthopedics;  Laterality: N/A;   Back Fusion  2002   BIOPSY  07/19/2018   Procedure: BIOPSY;  Surgeon: Jeani Hawking, MD;  Location: [facility] ENDOSCOPY;  Service: Endoscopy;;   BIOPSY  02/13/2019   Procedure: BIOPSY;  Surgeon: Jeani Hawking, MD;  Location: [facility] ENDOSCOPY;  Service: Endoscopy;;   CARPAL TUNNEL RELEASE  yates   left   COLONOSCOPY N/A 01/05/2014   Procedure: COLONOSCOPY;  Surgeon: Charna Elizabeth, MD;  Location: [facility] ENDOSCOPY;  Service: Endoscopy;  Laterality: N/A;   ELBOW SURGERY     age 38   ENTEROSCOPY N/A 02/13/2019   Procedure: ENTEROSCOPY;  Surgeon: Jeani Hawking, MD;  Location: [facility] ENDOSCOPY;  Service: Endoscopy;  Laterality: N/A;   ESOPHAGOGASTRODUODENOSCOPY (EGD) WITH PROPOFOL N/A 07/19/2018   Procedure: ESOPHAGOGASTRODUODENOSCOPY (EGD) WITH PROPOFOL;  Surgeon: Jeani Hawking, MD;  Location: [facility] ENDOSCOPY;  Service: Endoscopy;  Laterality: N/A;   EXTERNAL EAR SURGERY Left    EYE SURGERY     "removed white dots under eyelid"   FINGER SURGERY Left    INJECTION KNEE Right 07/21/2022   Procedure: INTRA-ARTICULAR INJECTION UNDER ANESTHESIA;  Surgeon: Eldred Manges, MD;  Location: [facility] OR;  Service: Orthopedics;  Laterality: Right;   Juvara osteomy     KNEE SURGERY     NOSE SURGERY     Rt. toe bunion     skin, shave biopsy  05/03/2016   Left occipital scalp, top of scalp   TONSILLECTOMY     TOTAL HIP ARTHROPLASTY Right 12/17/2020   TOTAL HIP ARTHROPLASTY Right 12/17/2020   Procedure: TOTAL HIP ARTHROPLASTY-DIRECT ANTERIOR;  Surgeon: Eldred Manges, MD;  Location: [facility] OR;  Service: Orthopedics;  Laterality: Right;  needs RNFA   TOTAL KNEE ARTHROPLASTY Left 07/21/2022    Procedure: LEFT TOTAL KNEE ARTHROPLASTY;  Surgeon: Eldred Manges, MD;  Location: [facility] OR;  Service: Orthopedics;  Laterality: Left;   UPPER EXTREMITY ANGIOGRAPHY Bilateral 10/11/2018   Procedure: UPPER EXTREMITY ANGIOGRAPHY;  Surgeon: Sherren Kerns, MD;  Location: [facility] INVASIVE CV LAB;  Service: Cardiovascular;  Laterality: Bilateral;   Patient Active Problem List   Diagnosis Date Noted   Hx of total knee replacement, left 08/04/2022   Arthritis of left knee 07/21/2022   Hypertrophic cardiomyopathy (HCC) 07/10/2022   Insomnia secondary to chronic pain 12/27/2020   Insomnia due to other mental disorder 12/27/2020   Snoring 12/27/2020   Inadequate sleep hygiene 12/27/2020   S/P total right hip arthroplasty 12/18/2020   Arthritis of right hip 12/17/2020   Encounter for preoperative assessment 12/10/2020   Infection of nail bed of finger of right hand 09/15/2020   Pain in right finger(s) 08/31/2020   Ganglion of right wrist 08/31/2020   COPD mixed type (HCC) 12/26/2018   Healthcare maintenance 12/26/2018   History of fall 11/05/2018   Weight loss 11/05/2018   Weakness 11/05/2018   Gangrene of finger (HCC) 10/09/2018   Raynaud's phenomenon with gangrene (HCC) 10/09/2018   LVH (left ventricular hypertrophy) 10/09/2018   Vasospasm (HCC) 09/06/2018   Hypotension 08/08/2018   Leucocytosis 08/08/2018   Elevated troponin I level 08/08/2018   Nausea and vomiting 08/06/2018   Pneumonia 07/12/2018   COPD exacerbation (HCC) 07/11/2018   Primary osteoarthritis of both feet 12/21/2017   DDD (degenerative disc disease), lumbar 12/21/2017   Primary osteoarthritis of both knees 12/21/2017   History of bilateral carpal tunnel release 12/21/2017   DDD (degenerative disc disease), cervical 11/15/2017   Former smoker 11/15/2017   Bronchiectasis without complication (HCC) 10/25/2017   Impingement syndrome of right shoulder 07/25/2017   Hx of fusion of cervical spine 07/25/2017   Cervical spinal  stenosis 09/27/2016   Polypharmacy 06/23/2015   Hyperlipidemia 04/19/2015   Visit for preventive health examination 01/01/2015   Primary osteoarthritis involving multiple joints 01/01/2015   Bipolar affective disorder, currently depressed, moderate (HCC)    Bipolar I disorder with mania (HCC) 08/17/2014   Hypertension 10/21/2013   Dizziness 03/25/2013   Medication withdrawal (HCC) 03/05/2013   Diabetes mellitus with renal manifestations, controlled (HCC) 11/10/2012   Alopecia areata 02/06/2012   Obesity (BMI 30-39.9) 02/06/2012   Renal insufficiency 07/16/2011   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    Exertional dyspnea 02/07/2011   Dyspnea on exertion 01/19/2011   DM (diabetes mellitus), type 2 (HCC) 04/22/2010   Carpal tunnel syndrome 02/02/2010   CONSTIPATION 02/02/2010  Primary osteoarthritis of both hands 02/02/2010   CATARACTS 04/13/2009   PAIN IN JOINT, ANKLE AND FOOT 09/16/2008   Eosinophilia 07/21/2008   AFFECTIVE DISORDER 04/21/2008   BACK PAIN, CHRONIC 02/12/2008   LEG PAIN 02/12/2008   ABNORMAL INVOLUNTARY MOVEMENTS 12/04/2007   POSTURAL LIGHTHEADEDNESS 11/04/2007   COLONIC POLYPS, HX OF 11/04/2007   Essential hypertension 06/17/2007   HYPERLIPIDEMIA 04/08/2007   GERD 04/08/2007   LOW BACK PAIN SYNDROME 04/08/2007   CHEST PAIN, RECURRENT 04/08/2007     THERAPY DIAG:  Acute pain of left knee  Stiffness of left knee, not elsewhere classified  Difficulty in walking, not elsewhere classified  Localized edema  Muscle weakness (generalized)  PCP: [name], MD   REFERRING PROVIDER: [name] MD   REFERRING DIAG: M17.12 (ICD-10-CM) - Unilateral primary osteoarthritis, left knee   THERAPY DIAG:  Acute pain of left knee   Stiffness of left knee, not elsewhere classified   Difficulty in walking, not elsewhere classified   Localized edema   Muscle weakness (generalized)   Rationale for Evaluation and Treatment Rehabilitation   ONSET DATE:  07/21/22   SUBJECTIVE:    SUBJECTIVE STATEMENT: Maythe reports he has been taking tylenol for pain.  Her early HEP compliance is good.   PERTINENT HISTORY: Lt TKA 07/21/22 PMH: COPD,lumbar DDD and fusion L4-5 2002,,cerv fusion 2017, Rt THA, DM   PAIN:  NPRS scale: 6/10 Pain location: left knee Pain description: achy, throbbing Aggravating factors: sleeping, not moving, flexing Relieving factors: pain meds, ice, elevation   PRECAUTIONS: None   WEIGHT BEARING RESTRICTIONS No   FALLS:  Has patient fallen in last 6 months? No   LIVING ENVIRONMENT: Lives with: lives with their family Lives in: House/apartment Stairs: No Has following equipment at home: Gilford Rile - 4 wheeled, RW, st cane, grab bars in bathroom. Raised toilet seat,  Pt stating her shower chair doesn't  fit properly.    OCCUPATION: retired   PLOF: Independent   PATIENT GOALS walk without walker, stop hurting     OBJECTIVE:    DIAGNOSTIC FINDINGS:             07/21/22 IMPRESSION: LEFT knee arthroplasty without acute complication.   PATIENT SURVEYS:  08/08/22: FOTO intake: 51%  predicted:  55%   COGNITION:           08/08/22 Overall cognitive status: WFL               SENSATION: 08/08/22: WFL   EDEMA:  08/08/22 Rt knee circumferential: 37 centimeters                 Left knee circumferential: 45 centimeters   MUSCLE LENGTH: Hamstrings: Right 50 deg; Left 68 deg     POSTURE: rounded shoulders and forward head   PALPATION: TTP: lateral joint line and around incision site, pt also    LOWER EXTREMITY STRENGTH:   MMT Right 08/08/22 Left 08/08/22  Hip flexion 5/5 4/5  Hip extension      Hip abduction      Hip adduction      Hip internal rotation      Hip external rotation      Knee flexion 5/5 3/5  Knee extension 5/5 3/5  Ankle dorsiflexion 5/5 5/5  Ankle plantarflexion 5/5 5/5  Ankle inversion      Ankle eversion       (Blank rows = not tested)   LOWER EXTREMITY ROM:    ROM A=active P=passive Right 08/08/22 supine  Left 08/08/22 supine Left 08/09/22 Supine  Hip flexion       Hip extension       Knee flexion A: 128   A: 70 P: 80   A: 77 P: 85  Knee extension A: 0   A: -8 P: -5 A: -2 edema limited   (Blank rows = not tested)       FUNCTIONAL TESTS:  08/08/22   GAIT: Distance walked: 50 feet  Assistive device utilized: Environmental consultant - 4 wheeled Level of assistance: Modified independence Comments: antalgic gait pattern     TODAY'S TREATMENT: 08/09/22: Recumbent bike Seat 5 AAROM (R side controls L knee flexion and reach into full extension) Tailgate knee flexion 2 minutes Knee flexion AAROM R pushes L into flexion 10X 10 seconds Quadriceps sets 10X 5 seconds Supine knee flexion with belt 10X 10 seconds  Functional Activities for sit to stand and stairs: Leg Press double leg full extension and stretch into flexion 15X 50# slow eccentrics Single leg 10X Left only 25# 10X same instructions as double leg  Vasopneumatic L knee Medium Pressure 34* 10 minutes   08/08/22:  Therex:    HEP instruction/performance c cues for techniques, handout provided.  Trial set performed of each for comprehension and symptom assessment.  See below for exercise list       PATIENT EDUCATION:  Education details: HEP, POC Person educated: Patient Education method: Explanation, Demonstration, Verbal cues, and Handouts Education comprehension: verbalized understanding, returned demonstration, and verbal cues required       HOME EXERCISE PROGRAM: Access Code: [email or web address] URL: [email or web address] Date: 08/08/2022 Prepared by: Narda Amber   Exercises - Supine Quad Set  - 3-4 x daily - 7 x weekly - 2 sets - 10 reps - Seated Long Arc Quad  - 3-4 x daily - 7 x weekly - 2 sets - 10 reps - 3-5 seconds hold - Sit to Stand with Counter Support  - 3-4 x daily - 7 x weekly - 2 sets - 10 reps - Seated Hamstring Stretch  - 3-4 x daily - 7 x weekly -  3 reps - 30 seconds hold - Supine Heel Slide with Strap  - 3-4 x daily - 7 x weekly - 10 reps - 5-10 seconds hold   ASSESSMENT:   CLINICAL IMPRESSION: Hailea is already making objective progress towards LTGs established at evaluation.  Extension AROM appears to be limited only by edema.  Flexion AROM and quadriceps strengthening are an early emphasis with gait and balance activities to be progressed as appropriate.       OBJECTIVE IMPAIRMENTS Abnormal gait, decreased balance, decreased mobility, difficulty walking, decreased ROM, decreased strength, increased edema, and pain.    ACTIVITY LIMITATIONS bending, sitting, standing, sleeping, stairs, transfers, bed mobility, toileting, and dressing   PARTICIPATION LIMITATIONS: cleaning, laundry, shopping, and community activity   PERSONAL FACTORS 3+ comorbidities: see above  are also affecting patient's functional outcome.    REHAB POTENTIAL: Good   CLINICAL DECISION MAKING: Stable/uncomplicated   EVALUATION COMPLEXITY: Low     GOALS: Goals reviewed with patient? Yes   Short term PT Goals (target date for Short term goals are 3 weeks 09/01/22) Patient will demonstrate independent use of home exercise program to maintain progress from in clinic treatments. Goal status: On Going 08/09/2022   Pt will be able to improve her left knee flexion to >/= 90 degrees actively Goal Status: On Going 08/09/2022   Long term PT goals (target dates for all long term  goals are 12 weeks  11/03/22 )   1. Patient will demonstrate/report pain at worst less than or equal to 2/10 to facilitate minimal limitation in daily activity secondary to pain symptoms. Goal status: New   2. Patient will demonstrate independent use of home exercise program to facilitate ability to maintain/progress functional gains from skilled physical therapy services. Goal status: New   3. Patient will demonstrate FOTO outcome > or = 55 % to indicate reduced disability due to  condition. Goal status: New   4.  Patient will demonstrate left LE MMT 5/5 throughout to faciltiate usual transfers, stairs, squatting at [facility] for daily life.    Goal status: New   5.  Pt will improve her 5 time sit to stand to </= 15 seconds c/s UE support.    Goal status: New   6.  Pt will be able to navigate curb step with/without assistive device safely.   Goal status: New    7. Pt will improve her left knee flexion to >/= 115 degrees for improved functional mobility and safely.                        A. Goal Status: New           PLAN:   PT FREQUENCY: 2x-3/week   PT DURATION: 12 weeks   PLANNED INTERVENTIONS: Therapeutic exercises, Therapeutic activity, Neuro Muscular re-education, Balance training, Gait training, Patient/Family education, Joint mobilization, Stair training, DME instructions, Dry Needling, Electrical stimulation, Traction, Cryotherapy, Moist heat, Taping, Ultrasound, Ionotophoresis 4mg /ml Dexamethasone, and Manual therapy.  All included unless contraindicated     PLAN FOR NEXT SESSION: Review HEP knowledge/results, Nustep/Bike, Knee flexion ROM, LE/quadriceps strengthening, vasopneumatic    , PT, MPT 08/09/2022, 5:42 PM

## 2022-08-10 ENCOUNTER — Telehealth: Payer: Self-pay | Admitting: *Deleted

## 2022-08-10 DIAGNOSIS — H43813 Vitreous degeneration, bilateral: Secondary | ICD-10-CM | POA: Diagnosis not present

## 2022-08-10 DIAGNOSIS — Z79899 Other long term (current) drug therapy: Secondary | ICD-10-CM | POA: Diagnosis not present

## 2022-08-10 DIAGNOSIS — M0570 Rheumatoid arthritis with rheumatoid factor of unspecified site without organ or systems involvement: Secondary | ICD-10-CM | POA: Diagnosis not present

## 2022-08-10 DIAGNOSIS — H40023 Open angle with borderline findings, high risk, bilateral: Secondary | ICD-10-CM | POA: Diagnosis not present

## 2022-08-10 DIAGNOSIS — H47013 Ischemic optic neuropathy, bilateral: Secondary | ICD-10-CM | POA: Diagnosis not present

## 2022-08-10 DIAGNOSIS — E119 Type 2 diabetes mellitus without complications: Secondary | ICD-10-CM | POA: Diagnosis not present

## 2022-08-10 DIAGNOSIS — H35033 Hypertensive retinopathy, bilateral: Secondary | ICD-10-CM | POA: Diagnosis not present

## 2022-08-10 LAB — HM DIABETES EYE EXAM

## 2022-08-10 NOTE — Telephone Encounter (Signed)
Patient contacted the office and left message stating she is at her eye doctor and wants to know if there is any special test she needs done.   Attempted to contact the patient and left message to advise patient she needs a PLQ eye exam which includes an OCT and a visual field.

## 2022-08-11 ENCOUNTER — Telehealth: Payer: Self-pay | Admitting: Rheumatology

## 2022-08-11 NOTE — Telephone Encounter (Signed)
Returned call to patient and advised patient she would need to call the eye doctor to have the results sent to our office. Patient advised we would review the results and call if she needs anything further. Patient expressed understanding.

## 2022-08-11 NOTE — Telephone Encounter (Signed)
Patient left a voicemail (Thursday, 08/10/22 at 5:56 pm) stating she was returning Andrea's call.  Patient states she didn't see the message until after her eye appointment.  Patient states she isn't scheduled for another exam until April of 2024.  Patient states you can call Dr. Marylynn Pearson at [facility] on [facility] for the results of her exam.  [phone]

## 2022-08-15 ENCOUNTER — Telehealth: Payer: Self-pay | Admitting: Internal Medicine

## 2022-08-15 ENCOUNTER — Telehealth: Payer: Self-pay | Admitting: Rheumatology

## 2022-08-15 NOTE — Telephone Encounter (Signed)
Pt called to say she got her flu and RSV shots and would like to know what else she can get ?

## 2022-08-15 NOTE — Telephone Encounter (Signed)
Patient called to see if we received her Plaquenil eye exam which I confirmed.  Patient states she is not sure why she is taking the medication and requested a return call.

## 2022-08-15 NOTE — Telephone Encounter (Signed)
Attempted to contact the patient and left message for patient to call the office.  

## 2022-08-16 ENCOUNTER — Ambulatory Visit: Payer: Medicare HMO | Admitting: Physical Therapy

## 2022-08-16 ENCOUNTER — Ambulatory Visit: Payer: Medicare HMO | Attending: Internal Medicine

## 2022-08-16 ENCOUNTER — Telehealth: Payer: Self-pay | Admitting: Rheumatology

## 2022-08-16 ENCOUNTER — Encounter: Payer: Self-pay | Admitting: Physical Therapy

## 2022-08-16 DIAGNOSIS — R600 Localized edema: Secondary | ICD-10-CM | POA: Diagnosis not present

## 2022-08-16 DIAGNOSIS — I10 Essential (primary) hypertension: Secondary | ICD-10-CM

## 2022-08-16 DIAGNOSIS — M25562 Pain in left knee: Secondary | ICD-10-CM | POA: Diagnosis not present

## 2022-08-16 DIAGNOSIS — M6281 Muscle weakness (generalized): Secondary | ICD-10-CM | POA: Diagnosis not present

## 2022-08-16 DIAGNOSIS — R262 Difficulty in walking, not elsewhere classified: Secondary | ICD-10-CM

## 2022-08-16 DIAGNOSIS — M25662 Stiffness of left knee, not elsewhere classified: Secondary | ICD-10-CM | POA: Diagnosis not present

## 2022-08-16 NOTE — Telephone Encounter (Signed)
See previous phone note for details 

## 2022-08-16 NOTE — Therapy (Signed)
OUTPATIENT PHYSICAL THERAPY TREATMENT NOTE   Patient Name: Desiree [name] MRN: [id number] DOB:[birth year], 71 y.o., female Today's Date: 08/16/2022  END OF SESSION:   PT End of Session - 08/16/22 1355     Visit Number 3    Number of Visits 24    Date for PT Re-Evaluation 11/03/22    Authorization Type humana requested 12 visit    Authorization Time Period 10/17-1/12    Authorization - Visit Number 3    Authorization - Number of Visits 12    Progress Note Due on Visit 10    PT Start Time 2951   pt arrived late   PT Stop Time 1429    PT Time Calculation (min) 34 min    Activity Tolerance Patient tolerated treatment well;Patient limited by pain;No increased pain    Behavior During Therapy WFL for tasks assessed/performed              Past Medical History:  Diagnosis Date   Acute gastritis without bleeding 08/08/2018   Anemia    Anxiety    Asthma    Bipolar disorder ([place])    CANDIDIASIS, ESOPHAGEAL 07/28/2009   Qualifier: Diagnosis of  By: [name] MD, [name]    Chronic kidney disease    CKD III   Complication of anesthesia    was told she stopped breathing for one of her finger surgeries   COPD (chronic obstructive pulmonary disease) ([place])    Depression    Diabetes mellitus without complication (HCC)    no meds   Dyspnea    At rest,and with activity   FH: colonic polyps    Fractured elbow    right    GERD (gastroesophageal reflux disease)    Headache    migraines   HH (hiatus hernia)    History of carpal tunnel syndrome    History of chest pain    History of transfusion of packed red blood cells    Hyperlipidemia    Hypertension    Mitral valve prolapse    Neuroleptic-induced tardive dyskinesia    Osteoarthritis of more than one site    Pneumonia    Seasonal allergies    Stroke [facility])    Past Surgical History:  Procedure Laterality Date   AMPUTATION Left 10/14/2018   Procedure: AMPUTATION LEFT LONG FINGER TIP;  Surgeon: Dayna Barker, MD;  Location:  [place];  Service: Plastics;  Laterality: Left;   ANTERIOR CERVICAL DECOMP/DISCECTOMY FUSION  09/27/2016   C5-6 anterior cervical discectomy and fusion, allograft and plate/notes 09/27/2016   ANTERIOR CERVICAL DECOMP/DISCECTOMY FUSION N/A 09/27/2016   Procedure: C5-6 Anterior Cervical Discectomy and Fusion, Allograft and Plate;  Surgeon: [name], MD;  Location: [place];  Service: Orthopedics;  Laterality: N/A;   Back Fusion  2002   BIOPSY  07/19/2018   Procedure: BIOPSY;  Surgeon: [name], MD;  Location: [facility] ENDOSCOPY;  Service: Endoscopy;;   BIOPSY  02/13/2019   Procedure: BIOPSY;  Surgeon: [name], MD;  Location: [facility] ENDOSCOPY;  Service: Endoscopy;;   CARPAL TUNNEL RELEASE  yates   left   COLONOSCOPY N/A 01/05/2014   Procedure: COLONOSCOPY;  Surgeon: [name], MD;  Location: [facility] ENDOSCOPY;  Service: Endoscopy;  Laterality: N/A;   ELBOW SURGERY     age 33   ENTEROSCOPY N/A 02/13/2019   Procedure: ENTEROSCOPY;  Surgeon: [name], MD;  Location: [facility] ENDOSCOPY;  Service: Endoscopy;  Laterality: N/A;   ESOPHAGOGASTRODUODENOSCOPY (EGD) WITH PROPOFOL N/A 07/19/2018   Procedure: ESOPHAGOGASTRODUODENOSCOPY (  EGD) WITH PROPOFOL;  Surgeon: Jeani Hawking, MD;  Location: [facility] ENDOSCOPY;  Service: Endoscopy;  Laterality: N/A;   EXTERNAL EAR SURGERY Left    EYE SURGERY     "removed white dots under eyelid"   FINGER SURGERY Left    INJECTION KNEE Right 07/21/2022   Procedure: INTRA-ARTICULAR INJECTION UNDER ANESTHESIA;  Surgeon: Eldred Manges, MD;  Location: [facility] OR;  Service: Orthopedics;  Laterality: Right;   Juvara osteomy     KNEE SURGERY     NOSE SURGERY     Rt. toe bunion     skin, shave biopsy  05/03/2016   Left occipital scalp, top of scalp   TONSILLECTOMY     TOTAL HIP ARTHROPLASTY Right 12/17/2020   TOTAL HIP ARTHROPLASTY Right 12/17/2020   Procedure: TOTAL HIP ARTHROPLASTY-DIRECT ANTERIOR;  Surgeon: Eldred Manges, MD;  Location: [facility] OR;  Service: Orthopedics;  Laterality: Right;   needs RNFA   TOTAL KNEE ARTHROPLASTY Left 07/21/2022   Procedure: LEFT TOTAL KNEE ARTHROPLASTY;  Surgeon: Eldred Manges, MD;  Location: [facility] OR;  Service: Orthopedics;  Laterality: Left;   UPPER EXTREMITY ANGIOGRAPHY Bilateral 10/11/2018   Procedure: UPPER EXTREMITY ANGIOGRAPHY;  Surgeon: Sherren Kerns, MD;  Location: [facility] INVASIVE CV LAB;  Service: Cardiovascular;  Laterality: Bilateral;   Patient Active Problem List   Diagnosis Date Noted   Hx of total knee replacement, left 08/04/2022   Arthritis of left knee 07/21/2022   Hypertrophic cardiomyopathy (HCC) 07/10/2022   Insomnia secondary to chronic pain 12/27/2020   Insomnia due to other mental disorder 12/27/2020   Snoring 12/27/2020   Inadequate sleep hygiene 12/27/2020   S/P total right hip arthroplasty 12/18/2020   Arthritis of right hip 12/17/2020   Encounter for preoperative assessment 12/10/2020   Infection of nail bed of finger of right hand 09/15/2020   Pain in right finger(s) 08/31/2020   Ganglion of right wrist 08/31/2020   COPD mixed type (HCC) 12/26/2018   Healthcare maintenance 12/26/2018   History of fall 11/05/2018   Weight loss 11/05/2018   Weakness 11/05/2018   Gangrene of finger (HCC) 10/09/2018   Raynaud's phenomenon with gangrene (HCC) 10/09/2018   LVH (left ventricular hypertrophy) 10/09/2018   Vasospasm (HCC) 09/06/2018   Hypotension 08/08/2018   Leucocytosis 08/08/2018   Elevated troponin I level 08/08/2018   Nausea and vomiting 08/06/2018   Pneumonia 07/12/2018   COPD exacerbation (HCC) 07/11/2018   Primary osteoarthritis of both feet 12/21/2017   DDD (degenerative disc disease), lumbar 12/21/2017   Primary osteoarthritis of both knees 12/21/2017   History of bilateral carpal tunnel release 12/21/2017   DDD (degenerative disc disease), cervical 11/15/2017   Former smoker 11/15/2017   Bronchiectasis without complication (HCC) 10/25/2017   Impingement syndrome of right shoulder 07/25/2017   Hx of  fusion of cervical spine 07/25/2017   Cervical spinal stenosis 09/27/2016   Polypharmacy 06/23/2015   Hyperlipidemia 04/19/2015   Visit for preventive health examination 01/01/2015   Primary osteoarthritis involving multiple joints 01/01/2015   Bipolar affective disorder, currently depressed, moderate (HCC)    Bipolar I disorder with mania (HCC) 08/17/2014   Hypertension 10/21/2013   Dizziness 03/25/2013   Medication withdrawal (HCC) 03/05/2013   Diabetes mellitus with renal manifestations, controlled (HCC) 11/10/2012   Alopecia areata 02/06/2012   Obesity (BMI 30-39.9) 02/06/2012   Renal insufficiency 07/16/2011   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    Exertional dyspnea 02/07/2011   Dyspnea on exertion 01/19/2011   DM (diabetes mellitus), type 2 (HCC)  04/22/2010   Carpal tunnel syndrome 02/02/2010   CONSTIPATION 02/02/2010   Primary osteoarthritis of both hands 02/02/2010   CATARACTS 04/13/2009   PAIN IN JOINT, ANKLE AND FOOT 09/16/2008   Eosinophilia 07/21/2008   AFFECTIVE DISORDER 04/21/2008   BACK PAIN, CHRONIC 02/12/2008   LEG PAIN 02/12/2008   ABNORMAL INVOLUNTARY MOVEMENTS 12/04/2007   POSTURAL LIGHTHEADEDNESS 11/04/2007   COLONIC POLYPS, HX OF 11/04/2007   Essential hypertension 06/17/2007   HYPERLIPIDEMIA 04/08/2007   GERD 04/08/2007   LOW BACK PAIN SYNDROME 04/08/2007   CHEST PAIN, RECURRENT 04/08/2007     THERAPY DIAG:  Acute pain of left knee  Stiffness of left knee, not elsewhere classified  Difficulty in walking, not elsewhere classified  Localized edema  Muscle weakness (generalized)  PCP: Madelin Headings, MD   REFERRING PROVIDER: Eldred Manges MD   REFERRING DIAG: M17.12 (ICD-10-CM) - Unilateral primary osteoarthritis, left knee   EVAL THERAPY DIAG:  Acute pain of left knee   Stiffness of left knee, not elsewhere classified   Difficulty in walking, not elsewhere classified   Localized edema   Muscle weakness (generalized)   Rationale  for Evaluation and Treatment Rehabilitation   ONSET DATE: 07/21/22   SUBJECTIVE:    SUBJECTIVE STATEMENT: "That knee is giving me a fit, especially at 3 am."    PERTINENT HISTORY: Lt TKA 07/21/22 PMH: COPD,lumbar DDD and fusion L4-5 2002,,cerv fusion 2017, Rt THA, DM   PAIN:  NPRS scale: 5/10 Pain location: left knee Pain description: achy, throbbing Aggravating factors: sleeping, not moving, flexing Relieving factors: pain meds, ice, elevation   PRECAUTIONS: None   WEIGHT BEARING RESTRICTIONS No   FALLS:  Has patient fallen in last 6 months? No   LIVING ENVIRONMENT: Lives with: lives with their family Lives in: House/apartment Stairs: No Has following equipment at home: [name] - 4 wheeled, RW, st cane, grab bars in bathroom. Raised toilet seat,  Pt stating her shower chair doesn't  fit properly.    OCCUPATION: retired   PLOF: Independent   PATIENT GOALS walk without walker, stop hurting     OBJECTIVE:     PATIENT SURVEYS:  08/08/22: FOTO intake: 51%  predicted:  55%   COGNITION: 08/08/22 Overall cognitive status: WFL               SENSATION: 08/08/22: WFL   EDEMA:  08/08/22 Rt knee circumferential: 37 centimeters                 Left knee circumferential: 45 centimeters   MUSCLE LENGTH: Hamstrings: Right 50 deg; Left 68 deg     POSTURE: rounded shoulders and forward head   PALPATION: TTP: lateral joint line and around incision site, pt also    LOWER EXTREMITY STRENGTH:   MMT Right 08/08/22 Left 08/08/22  Hip flexion 5/5 4/5  Hip extension      Hip abduction      Hip adduction      Hip internal rotation      Hip external rotation      Knee flexion 5/5 3/5  Knee extension 5/5 3/5  Ankle dorsiflexion 5/5 5/5  Ankle plantarflexion 5/5 5/5  Ankle inversion      Ankle eversion       (Blank rows = not tested)   LOWER EXTREMITY ROM:   ROM A=active P=passive Right 08/08/22 supine Left 08/08/22 supine Left 08/09/22 Supine Left 08/16/22   Hip flexion        Hip extension  Knee flexion A: 128   A: 70 P: 80   A: 77 P: 85 P: 95  Knee extension A: 0   A: -8 P: -5 A: -2 edema limited A: -12 (seated LAQ)   (Blank rows = not tested)      GAIT: Distance walked: 50 feet  Assistive device utilized: Walker - 4 wheeled Level of assistance: Modified independence Comments: antalgic gait pattern     TODAY'S TREATMENT: 08/16/22 Therex NuStep L6 x 8 min Leg Press bil 50# 2x10; LLE only 25# 2x10 Lt LAQ 3# 2x10; 5 sec hold Lt SAQ 2x10; 5 sec hold ROM measurements as noted above  Manual PROM Lt knee flexion in sitting to tolerance  08/09/22: Recumbent bike Seat 5 AAROM (R side controls L knee flexion and reach into full extension) Tailgate knee flexion 2 minutes Knee flexion AAROM R pushes L into flexion 10X 10 seconds Quadriceps sets 10X 5 seconds Supine knee flexion with belt 10X 10 seconds  Functional Activities for sit to stand and stairs: Leg Press double leg full extension and stretch into flexion 15X 50# slow eccentrics Single leg 10X Left only 25# 10X same instructions as double leg  Vasopneumatic L knee Medium Pressure 34* 10 minutes   08/08/22:  Therex:    HEP instruction/performance c cues for techniques, handout provided.  Trial set performed of each for comprehension and symptom assessment.  See below for exercise list       PATIENT EDUCATION:  Education details: HEP, POC Person educated: Patient Education method: Explanation, Demonstration, Verbal cues, and Handouts Education comprehension: verbalized understanding, returned demonstration, and verbal cues required       HOME EXERCISE PROGRAM: Access Code: [email or web address] URL: [email or web address] Date: 08/08/2022 Prepared by: Narda Amber   Exercises - Supine Quad Set  - 3-4 x daily - 7 x weekly - 2 sets - 10 reps - Seated Long Arc Quad  - 3-4 x daily - 7 x weekly - 2 sets - 10 reps - 3-5 seconds hold - Sit to Stand  with Counter Support  - 3-4 x daily - 7 x weekly - 2 sets - 10 reps - Seated Hamstring Stretch  - 3-4 x daily - 7 x weekly - 3 reps - 30 seconds hold - Supine Heel Slide with Strap  - 3-4 x daily - 7 x weekly - 10 reps - 5-10 seconds hold   ASSESSMENT:   CLINICAL IMPRESSION: Pt tolerated session well today with continued focus on quad activation as well as maximizing flexion.  PROM improved today with flexion.  Recommended wearing compression stocking due to swelling.  Will continue to benefit from PT to maximize function.     OBJECTIVE IMPAIRMENTS Abnormal gait, decreased balance, decreased mobility, difficulty walking, decreased ROM, decreased strength, increased edema, and pain.    ACTIVITY LIMITATIONS bending, sitting, standing, sleeping, stairs, transfers, bed mobility, toileting, and dressing   PARTICIPATION LIMITATIONS: cleaning, laundry, shopping, and community activity   PERSONAL FACTORS 3+ comorbidities: see above  are also affecting patient's functional outcome.    REHAB POTENTIAL: Good   CLINICAL DECISION MAKING: Stable/uncomplicated   EVALUATION COMPLEXITY: Low     GOALS: Goals reviewed with patient? Yes   Short term PT Goals (target date for Short term goals are 3 weeks 09/01/22) Patient will demonstrate independent use of home exercise program to maintain progress from in clinic treatments. Goal status: On Going 08/09/2022   Pt will be able to improve her left knee flexion to >/= 90 degrees  actively Goal Status: On Going 08/09/2022   Long term PT goals (target dates for all long term goals are 12 weeks  11/03/22 )   1. Patient will demonstrate/report pain at worst less than or equal to 2/10 to facilitate minimal limitation in daily activity secondary to pain symptoms. Goal status: New   2. Patient will demonstrate independent use of home exercise program to facilitate ability to maintain/progress functional gains from skilled physical therapy services. Goal  status: New   3. Patient will demonstrate FOTO outcome > or = 55 % to indicate reduced disability due to condition. Goal status: New   4.  Patient will demonstrate left LE MMT 5/5 throughout to faciltiate usual transfers, stairs, squatting at [facility] for daily life.    Goal status: New   5.  Pt will improve her 5 time sit to stand to </= 15 seconds c/s UE support.    Goal status: New   6.  Pt will be able to navigate curb step with/without assistive device safely.   Goal status: New    7. Pt will improve her left knee flexion to >/= 115 degrees for improved functional mobility and safely.                        A. Goal Status: New           PLAN:   PT FREQUENCY: 2x-3/week   PT DURATION: 12 weeks   PLANNED INTERVENTIONS: Therapeutic exercises, Therapeutic activity, Neuro Muscular re-education, Balance training, Gait training, Patient/Family education, Joint mobilization, Stair training, DME instructions, Dry Needling, Electrical stimulation, Traction, Cryotherapy, Moist heat, Taping, Ultrasound, Ionotophoresis 4mg /ml Dexamethasone, and Manual therapy.  All included unless contraindicated     PLAN FOR NEXT SESSION:  Nustep/Bike, Knee flexion ROM, LE/quadriceps strengthening, vasopneumatic    , PT, DPT 08/16/22 2:34 PM

## 2022-08-16 NOTE — Telephone Encounter (Signed)
Patient advised she is on the PLQ for her Rheumatoid Arthritis.

## 2022-08-16 NOTE — Telephone Encounter (Signed)
Patient called the office stating she missed a call from Andrea and requests a return call.  

## 2022-08-17 ENCOUNTER — Telehealth: Payer: Self-pay | Admitting: Internal Medicine

## 2022-08-17 LAB — BASIC METABOLIC PANEL
BUN/Creatinine Ratio: 11 — ABNORMAL LOW (ref 12–28)
BUN: 16 mg/dL (ref 8–27)
CO2: 23 mmol/L (ref 20–29)
Calcium: 10.2 mg/dL (ref 8.7–10.3)
Chloride: 102 mmol/L (ref 96–106)
Creatinine, Ser: 1.49 mg/dL — ABNORMAL HIGH (ref 0.57–1.00)
Glucose: 79 mg/dL (ref 70–99)
Potassium: 4.5 mmol/L (ref 3.5–5.2)
Sodium: 142 mmol/L (ref 134–144)
eGFR: 38 mL/min/{1.73_m2} — ABNORMAL LOW (ref >59)

## 2022-08-17 NOTE — Telephone Encounter (Signed)
Contact patient. Left a voicemail for patient to call us back.  

## 2022-08-17 NOTE — Telephone Encounter (Signed)
Spoke to patient. Patient reports she already had 2 dose of shingle vaccines, rsv and flu. Unsure of the date. Check [place] immunization site. Found no date. Last covid vaccine was pfizer 08/08/2021. Denied having pneumonia vaccine. Inform patient she can a date from her pharmacy and update [place] once she has it.  Patient wants to know what other vaccine she is in need of. Scheduled patient for physical exam on 09/26/2022.   Please advise.

## 2022-08-17 NOTE — Telephone Encounter (Signed)
Pt called in to speak with CMA regarding an earlier conversation

## 2022-08-18 ENCOUNTER — Encounter: Payer: Medicare HMO | Admitting: Rehabilitative and Restorative Service Providers"

## 2022-08-20 NOTE — Telephone Encounter (Signed)
Advise Updated prevnar 20 is the only one I see. ( She had the original pneumonia vaccine before age 71 )

## 2022-08-21 ENCOUNTER — Ambulatory Visit: Payer: Medicare HMO | Admitting: Pulmonary Disease

## 2022-08-21 ENCOUNTER — Encounter: Payer: Self-pay | Admitting: Pulmonary Disease

## 2022-08-21 VITALS — BP 120/66 | HR 105 | Temp 98.5°F | Wt 157.0 lb

## 2022-08-21 DIAGNOSIS — J449 Chronic obstructive pulmonary disease, unspecified: Secondary | ICD-10-CM | POA: Diagnosis not present

## 2022-08-21 DIAGNOSIS — J479 Bronchiectasis, uncomplicated: Secondary | ICD-10-CM
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
## 2022-08-21 NOTE — Patient Instructions (Signed)
It is nice to see you again  No changes to medication today  Return to clinic in 6 months or sooner as needed with Dr. Silas Flood

## 2022-08-22 ENCOUNTER — Telehealth: Payer: Self-pay | Admitting: Cardiovascular Disease

## 2022-08-22 ENCOUNTER — Encounter: Payer: Self-pay | Admitting: Physical Therapy

## 2022-08-22 ENCOUNTER — Ambulatory Visit (INDEPENDENT_AMBULATORY_CARE_PROVIDER_SITE_OTHER): Payer: Medicare HMO | Admitting: Physical Therapy

## 2022-08-22 DIAGNOSIS — M25662 Stiffness of left knee, not elsewhere classified: Secondary | ICD-10-CM

## 2022-08-22 DIAGNOSIS — R262 Difficulty in walking, not elsewhere classified: Secondary | ICD-10-CM | POA: Diagnosis not present

## 2022-08-22 DIAGNOSIS — M25562 Pain in left knee: Secondary | ICD-10-CM | POA: Diagnosis not present

## 2022-08-22 DIAGNOSIS — R600 Localized edema: Secondary | ICD-10-CM

## 2022-08-22 DIAGNOSIS — M6281 Muscle weakness (generalized): Secondary | ICD-10-CM | POA: Diagnosis not present

## 2022-08-22 NOTE — Telephone Encounter (Signed)
Spoke with pt and advised of lab results as below per Dr Gasper Sells.  Pt reports BP this week has been 120/66.  Pt advised no changes at this time.  Pt verbalizes understanding and thanked Therapist, sports for the call.  Werner Lean, MD  08/19/2022 11:23 AM EDT     Creatinine has improved of ARB In BP in < 130/80 will make no changes  If BP is elevated will increase CCB to 10 mg

## 2022-08-22 NOTE — Therapy (Signed)
OUTPATIENT PHYSICAL THERAPY TREATMENT NOTE   Patient Name: Desiree [name] MRN: [id number] DOB:02-16-1951, 71 y.o., female Today's Date: 08/22/2022  END OF SESSION:   PT End of Session - 08/22/22 1447     Visit Number 4    Number of Visits 24    Date for PT Re-Evaluation 11/03/22    Authorization Type humana requested 12 visit    Authorization Time Period 10/17-1/12    Authorization - Visit Number 4    Authorization - Number of Visits 12    Progress Note Due on Visit 10    PT Start Time 1434    PT Stop Time 1520    PT Time Calculation (min) 46 min    Activity Tolerance Patient tolerated treatment well;Patient limited by pain;No increased pain    Behavior During Therapy WFL for tasks assessed/performed               Past Medical History:  Diagnosis Date   Acute gastritis without bleeding 08/08/2018   Anemia    Anxiety    Asthma    Bipolar disorder (HCC)    CANDIDIASIS, ESOPHAGEAL 07/28/2009   Qualifier: Diagnosis of  By: Fabian Sharp MD, Neta Mends    Chronic kidney disease    CKD III   Complication of anesthesia    was told she stopped breathing for one of her finger surgeries   COPD (chronic obstructive pulmonary disease) (HCC)    Depression    Diabetes mellitus without complication (HCC)    no meds   Dyspnea    At rest,and with activity   FH: colonic polyps    Fractured elbow    right    GERD (gastroesophageal reflux disease)    Headache    migraines   HH (hiatus hernia)    History of carpal tunnel syndrome    History of chest pain    History of transfusion of packed red blood cells    Hyperlipidemia    Hypertension    Mitral valve prolapse    Neuroleptic-induced tardive dyskinesia    Osteoarthritis of more than one site    Pneumonia    Seasonal allergies    Stroke [facility])    Past Surgical History:  Procedure Laterality Date   AMPUTATION Left 10/14/2018   Procedure: AMPUTATION LEFT LONG FINGER TIP;  Surgeon: Knute Neu, MD;  Location: [facility] OR;  Service:  Plastics;  Laterality: Left;   ANTERIOR CERVICAL DECOMP/DISCECTOMY FUSION  09/27/2016   C5-6 anterior cervical discectomy and fusion, allograft and plate/notes 37/03/2830   ANTERIOR CERVICAL DECOMP/DISCECTOMY FUSION N/A 09/27/2016   Procedure: C5-6 Anterior Cervical Discectomy and Fusion, Allograft and Plate;  Surgeon: Eldred Manges, MD;  Location: [facility] OR;  Service: Orthopedics;  Laterality: N/A;   Back Fusion  2002   BIOPSY  07/19/2018   Procedure: BIOPSY;  Surgeon: Jeani Hawking, MD;  Location: [facility]COPY;  Service: Endoscopy;;   BIOPSY  02/13/2019   Procedure: BIOPSY;  Surgeon: Jeani Hawking, MD;  Location: [facility] ENDOSCOPY;  Service: Endoscopy;;   CARPAL TUNNEL RELEASE  yates   left   COLONOSCOPY N/A 01/05/2014   Procedure: COLONOSCOPY;  Surgeon: Charna Elizabeth, MD;  Location: [facility] ENDOSCOPY;  Service: Endoscopy;  Laterality: N/A;   ELBOW SURGERY     age 26   ENTEROSCOPY N/A 02/13/2019   Procedure: ENTEROSCOPY;  Surgeon: Jeani Hawking, MD;  Location: [facility] ENDOSCOPY;  Service: Endoscopy;  Laterality: N/A;   ESOPHAGOGASTRODUODENOSCOPY (EGD) WITH PROPOFOL N/A 07/19/2018   Procedure: ESOPHAGOGASTRODUODENOSCOPY (EGD) WITH PROPOFOL;  Surgeon: [name], MD;  Location: [place];  Service: Endoscopy;  Laterality: N/A;   EXTERNAL EAR SURGERY Left    EYE SURGERY     "removed white dots under eyelid"   FINGER SURGERY Left    INJECTION KNEE Right 07/21/2022   Procedure: INTRA-ARTICULAR INJECTION UNDER ANESTHESIA;  Surgeon: [name], MD;  Location: [place];  Service: Orthopedics;  Laterality: Right;   Juvara osteomy     KNEE SURGERY     NOSE SURGERY     Rt. toe bunion     skin, shave biopsy  05/03/2016   Left occipital scalp, top of scalp   TONSILLECTOMY     TOTAL HIP ARTHROPLASTY Right 12/17/2020   TOTAL HIP ARTHROPLASTY Right 12/17/2020   Procedure: TOTAL HIP ARTHROPLASTY-DIRECT ANTERIOR;  Surgeon: [name], MD;  Location: [place];  Service: Orthopedics;  Laterality: Right;  needs RNFA   TOTAL  KNEE ARTHROPLASTY Left 07/21/2022   Procedure: LEFT TOTAL KNEE ARTHROPLASTY;  Surgeon: [name], MD;  Location: [place];  Service: Orthopedics;  Laterality: Left;   UPPER EXTREMITY ANGIOGRAPHY Bilateral 10/11/2018   Procedure: UPPER EXTREMITY ANGIOGRAPHY;  Surgeon: [name], MD;  Location: [place] CV LAB;  Service: Cardiovascular;  Laterality: Bilateral;   Patient Active Problem List   Diagnosis Date Noted   Hx of total knee replacement, left 08/04/2022   Arthritis of left knee 07/21/2022   Hypertrophic cardiomyopathy ([place]) 07/10/2022   Insomnia secondary to chronic pain 12/27/2020   Insomnia due to other mental disorder 12/27/2020   Snoring 12/27/2020   Inadequate sleep hygiene 12/27/2020   S/P total right hip arthroplasty 12/18/2020   Arthritis of right hip 12/17/2020   Encounter for preoperative assessment 12/10/2020   Infection of nail bed of finger of right hand 09/15/2020   Pain in right finger(s) 08/31/2020   Ganglion of right wrist 08/31/2020   COPD mixed type ([place]) 12/26/2018   Healthcare maintenance 12/26/2018   History of fall 11/05/2018   Weight loss 11/05/2018   Weakness 11/05/2018   Gangrene of finger ([place]) 10/09/2018   Raynaud's phenomenon with gangrene ([place]) 10/09/2018   LVH (left ventricular hypertrophy) 10/09/2018   Vasospasm ([place]) 09/06/2018   Hypotension 08/08/2018   Leucocytosis 08/08/2018   Elevated troponin I level 08/08/2018   Nausea and vomiting 08/06/2018   Pneumonia 07/12/2018   COPD exacerbation ([place]) 07/11/2018   Primary osteoarthritis of both feet 12/21/2017   DDD (degenerative disc disease), lumbar 12/21/2017   Primary osteoarthritis of both knees 12/21/2017   History of bilateral carpal tunnel release 12/21/2017   DDD (degenerative disc disease), cervical 11/15/2017   Former smoker 11/15/2017   Bronchiectasis without complication ([place]) 78/67/6720   Impingement syndrome of right shoulder 07/25/2017   Hx of fusion of cervical  spine 07/25/2017   Cervical spinal stenosis 09/27/2016   Polypharmacy 06/23/2015   Hyperlipidemia 04/19/2015   Visit for preventive health examination 01/01/2015   Primary osteoarthritis involving multiple joints 01/01/2015   Bipolar affective disorder, currently depressed, moderate ([place])    Bipolar I disorder with mania ([place]) 08/17/2014   Hypertension 10/21/2013   Dizziness 03/25/2013   Medication withdrawal ([place]) 03/05/2013   Diabetes mellitus with renal manifestations, controlled ([place]) 11/10/2012   Alopecia areata 02/06/2012   Obesity (BMI 30-39.9) 02/06/2012   Renal insufficiency 07/16/2011   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    Exertional dyspnea 02/07/2011   Dyspnea on exertion 01/19/2011   DM (diabetes mellitus), type 2 ([place]) 04/22/2010   Carpal  tunnel syndrome 02/02/2010   CONSTIPATION 02/02/2010   Primary osteoarthritis of both hands 02/02/2010   CATARACTS 04/13/2009   PAIN IN JOINT, ANKLE AND FOOT 09/16/2008   Eosinophilia 07/21/2008   AFFECTIVE DISORDER 04/21/2008   BACK PAIN, CHRONIC 02/12/2008   LEG PAIN 02/12/2008   ABNORMAL INVOLUNTARY MOVEMENTS 12/04/2007   POSTURAL LIGHTHEADEDNESS 11/04/2007   COLONIC POLYPS, HX OF 11/04/2007   Essential hypertension 06/17/2007   HYPERLIPIDEMIA 04/08/2007   GERD 04/08/2007   LOW BACK PAIN SYNDROME 04/08/2007   CHEST PAIN, RECURRENT 04/08/2007     THERAPY DIAG:  Acute pain of left knee  Stiffness of left knee, not elsewhere classified  Difficulty in walking, not elsewhere classified  Localized edema  Muscle weakness (generalized)  PCP: Madelin Headings, MD   REFERRING PROVIDER: Eldred Manges MD   REFERRING DIAG: M17.12 (ICD-10-CM) - Unilateral primary osteoarthritis, left knee   EVAL THERAPY DIAG:  Acute pain of left knee   Stiffness of left knee, not elsewhere classified   Difficulty in walking, not elsewhere classified   Localized edema   Muscle weakness (generalized)   Rationale for Evaluation and  Treatment Rehabilitation   ONSET DATE: 07/21/22   SUBJECTIVE:    SUBJECTIVE STATEMENT: Pt sill reporting pain is worse in the middle of the night especially around 3-4am.    PERTINENT HISTORY: Lt TKA 07/21/22 PMH: COPD,lumbar DDD and fusion L4-5 2002,,cerv fusion 2017, Rt THA, DM   PAIN:  NPRS scale: 5/10 Pain location: left knee Pain description: achy, throbbing Aggravating factors: sleeping, not moving, flexing Relieving factors: pain meds, ice, elevation   PRECAUTIONS: None   WEIGHT BEARING RESTRICTIONS No   FALLS:  Has patient fallen in last 6 months? No   LIVING ENVIRONMENT: Lives with: lives with their family Lives in: House/apartment Stairs: No Has following equipment at home: [name] - 4 wheeled, RW, st cane, grab bars in bathroom. Raised toilet seat,  Pt stating her shower chair doesn't  fit properly.    OCCUPATION: retired   PLOF: Independent   PATIENT GOALS walk without walker, stop hurting     OBJECTIVE:     PATIENT SURVEYS:  08/08/22: FOTO intake: 51%  predicted:  55%   COGNITION: 08/08/22 Overall cognitive status: WFL               SENSATION: 08/08/22: WFL   EDEMA:  08/08/22 Rt knee circumferential: 37 centimeters                 Left knee circumferential: 45 centimeters   MUSCLE LENGTH: Hamstrings: Right 50 deg; Left 68 deg     POSTURE: rounded shoulders and forward head   PALPATION: TTP: lateral joint line and around incision site, pt also    LOWER EXTREMITY STRENGTH:   MMT Right 08/08/22 Left 08/08/22  Hip flexion 5/5 4/5  Hip extension      Hip abduction      Hip adduction      Hip internal rotation      Hip external rotation      Knee flexion 5/5 3/5  Knee extension 5/5 3/5  Ankle dorsiflexion 5/5 5/5  Ankle plantarflexion 5/5 5/5  Ankle inversion      Ankle eversion       (Blank rows = not tested)   LOWER EXTREMITY ROM:   ROM A=active P=passive Right 08/08/22 supine Left 08/08/22 supine Left 08/09/22 Supine  Left 08/16/22 Left 08/22/22 supine  Hip flexion         Hip  extension         Knee flexion A: 128   A: 70 P: 80   A: 77 P: 85 P: 95 A: 85 P: 94  Knee extension A: 0   A: -8 P: -5 A: -2 edema limited A: -12 (seated LAQ) A:-10 P: -8   (Blank rows = not tested)      GAIT: Distance walked: 50 feet  Assistive device utilized: Environmental consultant - 4 wheeled Level of assistance: Modified independence Comments: antalgic gait pattern     TODAY'S TREATMENT: 08/22/22 Therex NuStep L6 x 8 min Calf stretch on slant board x 2 holding 30 sec Leg Press bil 56# 2x10; LLE only 25# 2x10 Step ups on 4 inch step x 10  leading each LE c single UE support Lt LAQ 3# 2x10; 5 sec hold Seated SLR: 2 x 10  Manual PROM Lt knee flexion in sitting to tolerance PROM knee extension in supine with overpressure Modalities:  Vasopneumatic 34 degrees x 10 minutes Left knee, moderate compression   08/16/22 Therex NuStep L6 x 8 min Leg Press bil 50# 2x10; LLE only 25# 2x10 Lt LAQ 3# 2x10; 5 sec hold Lt SAQ 2x10; 5 sec hold ROM measurements as noted above  Manual PROM Lt knee flexion in sitting to tolerance    08/09/22: Recumbent bike Seat 5 AAROM (R side controls L knee flexion and reach into full extension) Tailgate knee flexion 2 minutes Knee flexion AAROM R pushes L into flexion 10X 10 seconds Quadriceps sets 10X 5 seconds Supine knee flexion with belt 10X 10 seconds  Functional Activities for sit to stand and stairs: Leg Press double leg full extension and stretch into flexion 15X 50# slow eccentrics Single leg 10X Left only 25# 10X same instructions as double leg  Vasopneumatic L knee Medium Pressure 34* 10 minutes       PATIENT EDUCATION:  Education details: HEP, POC Person educated: Patient Education method: Consulting civil engineer, Demonstration, Verbal cues, and Handouts Education comprehension: verbalized understanding, returned demonstration, and verbal cues required       HOME EXERCISE  PROGRAM: Access Code: [email or web address] URL: [email or web address] Date: 08/08/2022 Prepared by: Kearney Hard   Exercises - Supine Quad Set  - 3-4 x daily - 7 x weekly - 2 sets - 10 reps - Seated Long Arc Quad  - 3-4 x daily - 7 x weekly - 2 sets - 10 reps - 3-5 seconds hold - Sit to Stand with Counter Support  - 3-4 x daily - 7 x weekly - 2 sets - 10 reps - Seated Hamstring Stretch  - 3-4 x daily - 7 x weekly - 3 reps - 30 seconds hold - Supine Heel Slide with Strap  - 3-4 x daily - 7 x weekly - 10 reps - 5-10 seconds hold   ASSESSMENT:   CLINICAL IMPRESSION: Pt exercise tolerance was limited by pain and muscle fatigue. Focusing on left LE quad strengthening. Pt's left knee active arc of motion ranging Pt will continue to benefit from PT to maximize function.     OBJECTIVE IMPAIRMENTS Abnormal gait, decreased balance, decreased mobility, difficulty walking, decreased ROM, decreased strength, increased edema, and pain.    ACTIVITY LIMITATIONS bending, sitting, standing, sleeping, stairs, transfers, bed mobility, toileting, and dressing   PARTICIPATION LIMITATIONS: cleaning, laundry, shopping, and community activity   PERSONAL FACTORS 3+ comorbidities: see above  are also affecting patient's functional outcome.    REHAB POTENTIAL: Good   CLINICAL DECISION MAKING: Stable/uncomplicated   EVALUATION COMPLEXITY:  Low     GOALS: Goals reviewed with patient? Yes   Short term PT Goals (target date for Short term goals are 3 weeks 09/01/22) Patient will demonstrate independent use of home exercise program to maintain progress from in clinic treatments. Goal status: On Going 08/22/2022   Pt will be able to improve her left knee flexion to >/= 90 degrees actively Goal Status: On Going 08/09/2022   Long term PT goals (target dates for all long term goals are 12 weeks  11/03/22 )   1. Patient will demonstrate/report pain at worst less than or equal to 2/10 to facilitate minimal  limitation in daily activity secondary to pain symptoms. Goal status: New   2. Patient will demonstrate independent use of home exercise program to facilitate ability to maintain/progress functional gains from skilled physical therapy services. Goal status: New   3. Patient will demonstrate FOTO outcome > or = 55 % to indicate reduced disability due to condition. Goal status: New   4.  Patient will demonstrate left LE MMT 5/5 throughout to faciltiate usual transfers, stairs, squatting at [facility] for daily life.    Goal status: New   5.  Pt will improve her 5 time sit to stand to </= 15 seconds c/s UE support.    Goal status: New   6.  Pt will be able to navigate curb step with/without assistive device safely.   Goal status: New    7. Pt will improve her left knee flexion to >/= 115 degrees for improved functional mobility and safely.                        A. Goal Status: New           PLAN:   PT FREQUENCY: 2x-3/week   PT DURATION: 12 weeks   PLANNED INTERVENTIONS: Therapeutic exercises, Therapeutic activity, Neuro Muscular re-education, Balance training, Gait training, Patient/Family education, Joint mobilization, Stair training, DME instructions, Dry Needling, Electrical stimulation, Traction, Cryotherapy, Moist heat, Taping, Ultrasound, Ionotophoresis 4mg /ml Dexamethasone, and Manual therapy.  All included unless contraindicated     PLAN FOR NEXT SESSION:  Nustep/Bike, Knee flexion ROM, LE/quadriceps strengthening, vasopneumatic    , PT, MPT 08/22/22 2:49 PM   08/22/22 2:49 PM

## 2022-08-22 NOTE — Telephone Encounter (Signed)
Pt returning nurses call regarding lab results. Please advise 

## 2022-08-23 ENCOUNTER — Telehealth: Payer: Self-pay | Admitting: Pulmonary Disease

## 2022-08-23 NOTE — Telephone Encounter (Signed)
Pt called stating that she said the eye drop that Dr. Lanell Matar wanted her to try is ivizia which is for dry eyes. Routing to Dr. Silas Flood and April as an FYI. Med list has been updated to show this med.

## 2022-08-23 NOTE — Telephone Encounter (Signed)
Contact patient. Left a voicemail for patient to call us back.  

## 2022-08-25 ENCOUNTER — Encounter: Payer: Medicare HMO | Admitting: Rehabilitative and Restorative Service Providers"

## 2022-08-25 NOTE — Telephone Encounter (Signed)
Patient inform she had Prev 20 on 08/23/2022.

## 2022-08-25 NOTE — Telephone Encounter (Signed)
Spoke to patient. Updated her vaccines of RSV, FLU, COVID and [place].

## 2022-08-28 ENCOUNTER — Telehealth: Payer: Self-pay | Admitting: Podiatry

## 2022-08-28 NOTE — Telephone Encounter (Signed)
Pt calling and is scheduled to see Dr Sherryle Lis 11.29 and was asking about the status of her diabetic shoes as she has not heard anything.

## 2022-08-29 ENCOUNTER — Ambulatory Visit (INDEPENDENT_AMBULATORY_CARE_PROVIDER_SITE_OTHER): Payer: Medicare HMO | Admitting: Physical Therapy

## 2022-08-29 ENCOUNTER — Encounter: Payer: Self-pay | Admitting: Physical Therapy

## 2022-08-29 DIAGNOSIS — M25562 Pain in left knee: Secondary | ICD-10-CM | POA: Diagnosis not present

## 2022-08-29 DIAGNOSIS — R600 Localized edema: Secondary | ICD-10-CM | POA: Diagnosis not present

## 2022-08-29 DIAGNOSIS — M6281 Muscle weakness (generalized): Secondary | ICD-10-CM

## 2022-08-29 DIAGNOSIS — M25662 Stiffness of left knee, not elsewhere classified: Secondary | ICD-10-CM

## 2022-08-29 DIAGNOSIS — R262 Difficulty in walking, not elsewhere classified: Secondary | ICD-10-CM | POA: Diagnosis not present

## 2022-08-29 NOTE — Therapy (Signed)
OUTPATIENT PHYSICAL THERAPY TREATMENT NOTE   Patient Name: Desiree [name] MRN: [id number] DOB:[birth year], 71 y.o., female Today's Date: 08/29/2022  END OF SESSION:   PT End of Session - 08/29/22 1502     Visit Number 5    Number of Visits 24    Date for PT Re-Evaluation 11/03/22    Authorization Type humana requested 12 visit    Authorization Time Period 10/17-1/12    Authorization - Visit Number 5    Authorization - Number of Visits 12    Progress Note Due on Visit 10    PT Start Time 1430    PT Stop Time 1515    PT Time Calculation (min) 45 min    Activity Tolerance Patient tolerated treatment well;Patient limited by pain;No increased pain    Behavior During Therapy WFL for tasks assessed/performed               Past Medical History:  Diagnosis Date   Acute gastritis without bleeding 08/08/2018   Anemia    Anxiety    Asthma    Bipolar disorder ([place])    CANDIDIASIS, ESOPHAGEAL 07/28/2009   Qualifier: Diagnosis of  By: [name] MD, [name]    Chronic kidney disease    CKD III   Complication of anesthesia    was told she stopped breathing for one of her finger surgeries   COPD (chronic obstructive pulmonary disease) ([place])    Depression    Diabetes mellitus without complication (HCC)    no meds   Dyspnea    At rest,and with activity   FH: colonic polyps    Fractured elbow    right    GERD (gastroesophageal reflux disease)    Headache    migraines   HH (hiatus hernia)    History of carpal tunnel syndrome    History of chest pain    History of transfusion of packed red blood cells    Hyperlipidemia    Hypertension    Mitral valve prolapse    Neuroleptic-induced tardive dyskinesia    Osteoarthritis of more than one site    Pneumonia    Seasonal allergies    Stroke [facility])    Past Surgical History:  Procedure Laterality Date   AMPUTATION Left 10/14/2018   Procedure: AMPUTATION LEFT LONG FINGER TIP;  Surgeon: Dayna Barker, MD;  Location: [place];  Service:  Plastics;  Laterality: Left;   ANTERIOR CERVICAL DECOMP/DISCECTOMY FUSION  09/27/2016   C5-6 anterior cervical discectomy and fusion, allograft and plate/notes 09/27/2016   ANTERIOR CERVICAL DECOMP/DISCECTOMY FUSION N/A 09/27/2016   Procedure: C5-6 Anterior Cervical Discectomy and Fusion, Allograft and Plate;  Surgeon: [name], MD;  Location: [place];  Service: Orthopedics;  Laterality: N/A;   Back Fusion  2002   BIOPSY  07/19/2018   Procedure: BIOPSY;  Surgeon: [name], MD;  Location: [facility] ENDOSCOPY;  Service: Endoscopy;;   BIOPSY  02/13/2019   Procedure: BIOPSY;  Surgeon: [name], MD;  Location: [facility] ENDOSCOPY;  Service: Endoscopy;;   CARPAL TUNNEL RELEASE  yates   left   COLONOSCOPY N/A 01/05/2014   Procedure: COLONOSCOPY;  Surgeon: [name], MD;  Location: [facility] ENDOSCOPY;  Service: Endoscopy;  Laterality: N/A;   ELBOW SURGERY     age 2   ENTEROSCOPY N/A 02/13/2019   Procedure: ENTEROSCOPY;  Surgeon: [name], MD;  Location: [facility] ENDOSCOPY;  Service: Endoscopy;  Laterality: N/A;   ESOPHAGOGASTRODUODENOSCOPY (EGD) WITH PROPOFOL N/A 07/19/2018   Procedure: ESOPHAGOGASTRODUODENOSCOPY (EGD) WITH PROPOFOL;  Surgeon: [name], MD;  Location: [place];  Service: Endoscopy;  Laterality: N/A;   EXTERNAL EAR SURGERY Left    EYE SURGERY     "removed white dots under eyelid"   FINGER SURGERY Left    INJECTION KNEE Right 07/21/2022   Procedure: INTRA-ARTICULAR INJECTION UNDER ANESTHESIA;  Surgeon: [name], MD;  Location: [place];  Service: Orthopedics;  Laterality: Right;   Juvara osteomy     KNEE SURGERY     NOSE SURGERY     Rt. toe bunion     skin, shave biopsy  05/03/2016   Left occipital scalp, top of scalp   TONSILLECTOMY     TOTAL HIP ARTHROPLASTY Right 12/17/2020   TOTAL HIP ARTHROPLASTY Right 12/17/2020   Procedure: TOTAL HIP ARTHROPLASTY-DIRECT ANTERIOR;  Surgeon: [name], MD;  Location: [place];  Service: Orthopedics;  Laterality: Right;  needs RNFA   TOTAL  KNEE ARTHROPLASTY Left 07/21/2022   Procedure: LEFT TOTAL KNEE ARTHROPLASTY;  Surgeon: [name], MD;  Location: [place];  Service: Orthopedics;  Laterality: Left;   UPPER EXTREMITY ANGIOGRAPHY Bilateral 10/11/2018   Procedure: UPPER EXTREMITY ANGIOGRAPHY;  Surgeon: [name], MD;  Location: [place] CV LAB;  Service: Cardiovascular;  Laterality: Bilateral;   Patient Active Problem List   Diagnosis Date Noted   Hx of total knee replacement, left 08/04/2022   Arthritis of left knee 07/21/2022   Hypertrophic cardiomyopathy ([place]) 07/10/2022   Insomnia secondary to chronic pain 12/27/2020   Insomnia due to other mental disorder 12/27/2020   Snoring 12/27/2020   Inadequate sleep hygiene 12/27/2020   S/P total right hip arthroplasty 12/18/2020   Arthritis of right hip 12/17/2020   Encounter for preoperative assessment 12/10/2020   Infection of nail bed of finger of right hand 09/15/2020   Pain in right finger(s) 08/31/2020   Ganglion of right wrist 08/31/2020   COPD mixed type ([place]) 12/26/2018   Healthcare maintenance 12/26/2018   History of fall 11/05/2018   Weight loss 11/05/2018   Weakness 11/05/2018   Gangrene of finger ([place]) 10/09/2018   Raynaud's phenomenon with gangrene ([place]) 10/09/2018   LVH (left ventricular hypertrophy) 10/09/2018   Vasospasm ([place]) 09/06/2018   Hypotension 08/08/2018   Leucocytosis 08/08/2018   Elevated troponin I level 08/08/2018   Nausea and vomiting 08/06/2018   Pneumonia 07/12/2018   COPD exacerbation ([place]) 07/11/2018   Primary osteoarthritis of both feet 12/21/2017   DDD (degenerative disc disease), lumbar 12/21/2017   Primary osteoarthritis of both knees 12/21/2017   History of bilateral carpal tunnel release 12/21/2017   DDD (degenerative disc disease), cervical 11/15/2017   Former smoker 11/15/2017   Bronchiectasis without complication ([place]) 78/67/6720   Impingement syndrome of right shoulder 07/25/2017   Hx of fusion of cervical  spine 07/25/2017   Cervical spinal stenosis 09/27/2016   Polypharmacy 06/23/2015   Hyperlipidemia 04/19/2015   Visit for preventive health examination 01/01/2015   Primary osteoarthritis involving multiple joints 01/01/2015   Bipolar affective disorder, currently depressed, moderate ([place])    Bipolar I disorder with mania ([place]) 08/17/2014   Hypertension 10/21/2013   Dizziness 03/25/2013   Medication withdrawal ([place]) 03/05/2013   Diabetes mellitus with renal manifestations, controlled ([place]) 11/10/2012   Alopecia areata 02/06/2012   Obesity (BMI 30-39.9) 02/06/2012   Renal insufficiency 07/16/2011   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    Exertional dyspnea 02/07/2011   Dyspnea on exertion 01/19/2011   DM (diabetes mellitus), type 2 ([place]) 04/22/2010   Carpal  tunnel syndrome 02/02/2010   CONSTIPATION 02/02/2010   Primary osteoarthritis of both hands 02/02/2010   CATARACTS 04/13/2009   PAIN IN JOINT, ANKLE AND FOOT 09/16/2008   Eosinophilia 07/21/2008   AFFECTIVE DISORDER 04/21/2008   BACK PAIN, CHRONIC 02/12/2008   LEG PAIN 02/12/2008   ABNORMAL INVOLUNTARY MOVEMENTS 12/04/2007   POSTURAL LIGHTHEADEDNESS 11/04/2007   COLONIC POLYPS, HX OF 11/04/2007   Essential hypertension 06/17/2007   HYPERLIPIDEMIA 04/08/2007   GERD 04/08/2007   LOW BACK PAIN SYNDROME 04/08/2007   CHEST PAIN, RECURRENT 04/08/2007     THERAPY DIAG:  Acute pain of left knee  Stiffness of left knee, not elsewhere classified  Difficulty in walking, not elsewhere classified  Localized edema  Muscle weakness (generalized)  PCP: [name], MD   REFERRING PROVIDER: [name] MD   REFERRING DIAG: M17.12 (ICD-10-CM) - Unilateral primary osteoarthritis, left knee   EVAL THERAPY DIAG:  Acute pain of left knee   Stiffness of left knee, not elsewhere classified   Difficulty in walking, not elsewhere classified   Localized edema   Muscle weakness (generalized)   Rationale for Evaluation and  Treatment Rehabilitation   ONSET DATE: 07/21/22   SUBJECTIVE:    SUBJECTIVE STATEMENT: Pt sill reporting pain is worse in the middle of the night especially around 3-4am.    PERTINENT HISTORY: Lt TKA 07/21/22 PMH: COPD,lumbar DDD and fusion L4-5 2002,,cerv fusion 2017, Rt THA, DM   PAIN:  NPRS scale: 5/10 Pain location: left knee Pain description: achy, throbbing Aggravating factors: sleeping, not moving, flexing Relieving factors: pain meds, ice, elevation   PRECAUTIONS: None   WEIGHT BEARING RESTRICTIONS No   FALLS:  Has patient fallen in last 6 months? No   LIVING ENVIRONMENT: Lives with: lives with their family Lives in: House/apartment Stairs: No Has following equipment at home: Gilford Rile - 4 wheeled, RW, st cane, grab bars in bathroom. Raised toilet seat,  Pt stating her shower chair doesn't  fit properly.    OCCUPATION: retired   PLOF: Independent   PATIENT GOALS walk without walker, stop hurting     OBJECTIVE:     PATIENT SURVEYS:  08/08/22: FOTO intake: 51%  predicted:  55%   COGNITION: 08/08/22 Overall cognitive status: WFL               SENSATION: 08/08/22: WFL   EDEMA:  08/08/22 Rt knee circumferential: 37 centimeters                 Left knee circumferential: 45 centimeters   MUSCLE LENGTH: Hamstrings: Right 50 deg; Left 68 deg     POSTURE: rounded shoulders and forward head   PALPATION: TTP: lateral joint line and around incision site, pt also    LOWER EXTREMITY STRENGTH:   MMT Right 08/08/22 Left 08/08/22  Hip flexion 5/5 4/5  Hip extension      Hip abduction      Hip adduction      Hip internal rotation      Hip external rotation      Knee flexion 5/5 3/5  Knee extension 5/5 3/5  Ankle dorsiflexion 5/5 5/5  Ankle plantarflexion 5/5 5/5  Ankle inversion      Ankle eversion       (Blank rows = not tested)   LOWER EXTREMITY ROM:   ROM A=active P=passive Right 08/08/22 supine Left 08/08/22 supine Left 08/09/22 Supine  Left 08/16/22 Left 08/22/22 supine Left 08/29/22 supine  Hip flexion  Hip extension          Knee flexion A: 128   A: 70 P: 80   A: 77 P: 85 P: 95 A: 85 P: 94 A:90  Knee extension A: 0   A: -8 P: -5 A: -2 edema limited A: -12 (seated LAQ) A:-10 P: -8 A: -8   (Blank rows = not tested)      GAIT: Distance walked: 50 feet  Assistive device utilized: Environmental consultant - 4 wheeled Level of assistance: Modified independence Comments: antalgic gait pattern     TODAY'S TREATMENT: 08/29/22 Therex NuStep L6 x 8 min Calf stretch on slant board x 2 holding 30 sec Standing mini squats x 10 c UE support Leg Press bil 62# 2x10; LLE only 25# 2x10 Lt LAQ 3# 2x10; 5 sec hold Seated SLR: x 10 c 3#  Neuromuscular Re-edu: Standing on level surface: feet together, staggered stance each x 20 seconds c no UE support Standing on Airex: feet apart, feet together x 20 sec each with SBA Manual PROM Lt knee flexion in sitting to tolerance PROM knee extension in supine with overpressure Modalities:  Vasopneumatic 34 degrees x 10 minutes Left knee, moderate compression   08/22/22 Therex NuStep L6 x 8 min Calf stretch on slant board x 2 holding 30 sec Leg Press bil 56# 2x10; LLE only 25# 2x10 Step ups on 4 inch step x 10  leading each LE c single UE support Lt LAQ 3# 2x10; 5 sec hold Seated SLR: 2 x 10  Manual PROM Lt knee flexion in sitting to tolerance PROM knee extension in supine with overpressure Modalities:  Vasopneumatic 34 degrees x 10 minutes Left knee, moderate compression   08/16/22 Therex NuStep L6 x 8 min Leg Press bil 50# 2x10; LLE only 25# 2x10 Lt LAQ 3# 2x10; 5 sec hold Lt SAQ 2x10; 5 sec hold ROM measurements as noted above  Manual PROM Lt knee flexion in sitting to tolerance        PATIENT EDUCATION:  Education details: HEP, POC Person educated: Patient Education method: Consulting civil engineer, Demonstration, Verbal cues, and Handouts Education comprehension:  verbalized understanding, returned demonstration, and verbal cues required       HOME EXERCISE PROGRAM: Access Code: [email or web address] URL: [email or web address] Date: 08/08/2022 Prepared by: Kearney Hard   Exercises - Supine Quad Set  - 3-4 x daily - 7 x weekly - 2 sets - 10 reps - Seated Long Arc Quad  - 3-4 x daily - 7 x weekly - 2 sets - 10 reps - 3-5 seconds hold - Sit to Stand with Counter Support  - 3-4 x daily - 7 x weekly - 2 sets - 10 reps - Seated Hamstring Stretch  - 3-4 x daily - 7 x weekly - 3 reps - 30 seconds hold - Supine Heel Slide with Strap  - 3-4 x daily - 7 x weekly - 10 reps - 5-10 seconds hold   ASSESSMENT:   CLINICAL IMPRESSION: Pt tolerating exercises well with no increased pain reported. We began static standing balance this visit. Pt with most of her steri-strips intact along incision site. Pt stating most of her pain is at night. Pt's active arc of motion in her left knee was 8-90 degrees. Continue skilled PT to maximize pt's function.      OBJECTIVE IMPAIRMENTS Abnormal gait, decreased balance, decreased mobility, difficulty walking, decreased ROM, decreased strength, increased edema, and pain.    ACTIVITY LIMITATIONS bending, sitting, standing, sleeping, stairs, transfers, bed mobility, toileting,  and dressing   PARTICIPATION LIMITATIONS: cleaning, laundry, shopping, and community activity   PERSONAL FACTORS 3+ comorbidities: see above  are also affecting patient's functional outcome.    REHAB POTENTIAL: Good   CLINICAL DECISION MAKING: Stable/uncomplicated   EVALUATION COMPLEXITY: Low     GOALS: Goals reviewed with patient? Yes   Short term PT Goals (target date for Short term goals are 3 weeks 09/01/22) Patient will demonstrate independent use of home exercise program to maintain progress from in clinic treatments. Goal status: MET 08/29/22   Pt will be able to improve her left knee flexion to >/= 90 degrees actively Goal Status:  MET 08/29/22   Long term PT goals (target dates for all long term goals are 12 weeks  11/03/22 )   1. Patient will demonstrate/report pain at worst less than or equal to 2/10 to facilitate minimal limitation in daily activity secondary to pain symptoms. Goal status: New   2. Patient will demonstrate independent use of home exercise program to facilitate ability to maintain/progress functional gains from skilled physical therapy services. Goal status: New   3. Patient will demonstrate FOTO outcome > or = 55 % to indicate reduced disability due to condition. Goal status: New   4.  Patient will demonstrate left LE MMT 5/5 throughout to faciltiate usual transfers, stairs, squatting at [facility] for daily life.    Goal status: New   5.  Pt will improve her 5 time sit to stand to </= 15 seconds c/s UE support.    Goal status: New   6.  Pt will be able to navigate curb step with/without assistive device safely.   Goal status: New    7. Pt will improve her left knee flexion to >/= 115 degrees for improved functional mobility and safely.                        A. Goal Status: New           PLAN:   PT FREQUENCY: 2x-3/week   PT DURATION: 12 weeks   PLANNED INTERVENTIONS: Therapeutic exercises, Therapeutic activity, Neuro Muscular re-education, Balance training, Gait training, Patient/Family education, Joint mobilization, Stair training, DME instructions, Dry Needling, Electrical stimulation, Traction, Cryotherapy, Moist heat, Taping, Ultrasound, Ionotophoresis 65m/ml Dexamethasone, and Manual therapy.  All included unless contraindicated     PLAN FOR NEXT SESSION:  Nustep/Bike, Knee flexion ROM, LE/quadriceps strengthening, vasopneumatic, dynamic balance, progress gait to no device     JKearney Hard PT, MPT 08/29/22 3:03 PM   08/29/22 3:03 PM

## 2022-08-29 NOTE — Telephone Encounter (Signed)
Spoke with pt and informed her that I have not received her paperwork back to begin the process. Paperwork has been resubmitted and the pt is going to contact the office to inform them that paperwork will be coming over for them.

## 2022-08-30 ENCOUNTER — Telehealth: Payer: Self-pay | Admitting: *Deleted

## 2022-08-30 ENCOUNTER — Other Ambulatory Visit: Payer: Self-pay | Admitting: Internal Medicine

## 2022-08-30 NOTE — Telephone Encounter (Signed)
Patient called requesting refill of Tramadol and muscle relaxer. She asked about pain she is experiencing in her upper back between shoulder blades that has woken her up two nights in a row and comes/goes. I explained with her pulmonary history to make sure she is speaking with her PCP and pulmonologist maybe about this as upper back pain may not be related to her knee surgery. No chest pain and no difference in her normal breathing/SOB. She also mentioned that she took aspirin for the pain in her back along with pain medication and a muscle relaxer. She has been taking aspirin some for pain and taking up to 3 tablets of the 325 mg Aspirin thinking this was the appropriate dosage. We again discussed her medications and the purpose of each. I instructed her to only take 1- 325 mg Aspirin per day post surgery. She is actually over 30 days out. Does she still need this? Thank you.

## 2022-08-30 NOTE — Telephone Encounter (Signed)
Discussed with Dr. Ophelia Charter and call to patient and updated on conversation to continue with Tylenol and Ibuprofen if needed for pain. No refills given today. Patient has F/U next week in our office.

## 2022-08-31 ENCOUNTER — Encounter: Payer: Self-pay | Admitting: Rehabilitative and Restorative Service Providers"

## 2022-08-31 ENCOUNTER — Telehealth: Payer: Self-pay | Admitting: Pulmonary Disease

## 2022-08-31 ENCOUNTER — Ambulatory Visit (INDEPENDENT_AMBULATORY_CARE_PROVIDER_SITE_OTHER): Payer: Medicare HMO | Admitting: Rehabilitative and Restorative Service Providers"

## 2022-08-31 ENCOUNTER — Encounter: Payer: Self-pay | Admitting: *Deleted

## 2022-08-31 ENCOUNTER — Telehealth: Payer: Self-pay | Admitting: *Deleted

## 2022-08-31 DIAGNOSIS — M25662 Stiffness of left knee, not elsewhere classified: Secondary | ICD-10-CM

## 2022-08-31 DIAGNOSIS — R600 Localized edema: Secondary | ICD-10-CM | POA: Diagnosis not present

## 2022-08-31 DIAGNOSIS — M25562 Pain in left knee: Secondary | ICD-10-CM | POA: Diagnosis not present

## 2022-08-31 DIAGNOSIS — M6281 Muscle weakness (generalized): Secondary | ICD-10-CM | POA: Diagnosis not present

## 2022-08-31 DIAGNOSIS — R262 Difficulty in walking, not elsewhere classified: Secondary | ICD-10-CM

## 2022-08-31 NOTE — Telephone Encounter (Signed)
Spoke with pt, pt states having upper back pain close to neck with slight cough, no SOB. Informed pt I will send Dr Judeth Horn a message. Also pt received letter in mail stating income to high to receive breztri in Jan 2024. Dr Judeth Horn please advise.

## 2022-08-31 NOTE — Therapy (Signed)
OUTPATIENT PHYSICAL THERAPY TREATMENT NOTE   Patient Name: Desiree [name] MRN: [id number] DOB:[birth year], 71 y.o., female Today's Date: 08/31/2022  END OF SESSION:   PT End of Session - 08/31/22 1626     Visit Number 6    Number of Visits 24    Date for PT Re-Evaluation 11/03/22    Authorization Type humana requested 12 visit    Authorization Time Period 10/17-1/12    Authorization - Visit Number 6    Authorization - Number of Visits 12    Progress Note Due on Visit 10    PT Start Time 1430    PT Stop Time 1520    PT Time Calculation (min) 50 min    Activity Tolerance Patient tolerated treatment well;Patient limited by pain    Behavior During Therapy [facility] for tasks assessed/performed            Progress Note Reporting Period 08/08/2022 to 08/31/2022  See note below for Objective Data and Assessment of Progress/Goals.    Past Medical History:  Diagnosis Date   Acute gastritis without bleeding 08/08/2018   Anemia    Anxiety    Asthma    Bipolar disorder ([place])    CANDIDIASIS, ESOPHAGEAL 07/28/2009   Qualifier: Diagnosis of  By: [name] MD, [name]    Chronic kidney disease    CKD III   Complication of anesthesia    was told she stopped breathing for one of her finger surgeries   COPD (chronic obstructive pulmonary disease) ([place])    Depression    Diabetes mellitus without complication (HCC)    no meds   Dyspnea    At rest,and with activity   FH: colonic polyps    Fractured elbow    right    GERD (gastroesophageal reflux disease)    Headache    migraines   HH (hiatus hernia)    History of carpal tunnel syndrome    History of chest pain    History of transfusion of packed red blood cells    Hyperlipidemia    Hypertension    Mitral valve prolapse    Neuroleptic-induced tardive dyskinesia    Osteoarthritis of more than one site    Pneumonia    Seasonal allergies    Stroke [facility])    Past Surgical History:  Procedure Laterality Date   AMPUTATION Left  10/14/2018   Procedure: AMPUTATION LEFT LONG FINGER TIP;  Surgeon: Dayna Barker, MD;  Location: [place];  Service: Plastics;  Laterality: Left;   ANTERIOR CERVICAL DECOMP/DISCECTOMY FUSION  09/27/2016   C5-6 anterior cervical discectomy and fusion, allograft and plate/notes 09/27/2016   ANTERIOR CERVICAL DECOMP/DISCECTOMY FUSION N/A 09/27/2016   Procedure: C5-6 Anterior Cervical Discectomy and Fusion, Allograft and Plate;  Surgeon: [name], MD;  Location: [place];  Service: Orthopedics;  Laterality: N/A;   Back Fusion  2002   BIOPSY  07/19/2018   Procedure: BIOPSY;  Surgeon: [name], MD;  Location: [facility];  Service: Endoscopy;;   BIOPSY  02/13/2019   Procedure: BIOPSY;  Surgeon: [name], MD;  Location: [facility] ENDOSCOPY;  Service: Endoscopy;;   CARPAL TUNNEL RELEASE  yates   left   COLONOSCOPY N/A 01/05/2014   Procedure: COLONOSCOPY;  Surgeon: [name], MD;  Location: [facility];  Service: Endoscopy;  Laterality: N/A;   ELBOW SURGERY     age 87   ENTEROSCOPY N/A 02/13/2019   Procedure: ENTEROSCOPY;  Surgeon: [name], MD;  Location: [facility];  Service: Endoscopy;  Laterality:  N/A;   ESOPHAGOGASTRODUODENOSCOPY (EGD) WITH PROPOFOL N/A 07/19/2018   Procedure: ESOPHAGOGASTRODUODENOSCOPY (EGD) WITH PROPOFOL;  Surgeon: [name], MD;  Location: [place];  Service: Endoscopy;  Laterality: N/A;   EXTERNAL EAR SURGERY Left    EYE SURGERY     "removed white dots under eyelid"   FINGER SURGERY Left    INJECTION KNEE Right 07/21/2022   Procedure: INTRA-ARTICULAR INJECTION UNDER ANESTHESIA;  Surgeon: [name], MD;  Location: [place];  Service: Orthopedics;  Laterality: Right;   Juvara osteomy     KNEE SURGERY     NOSE SURGERY     Rt. toe bunion     skin, shave biopsy  05/03/2016   Left occipital scalp, top of scalp   TONSILLECTOMY     TOTAL HIP ARTHROPLASTY Right 12/17/2020   TOTAL HIP ARTHROPLASTY Right 12/17/2020   Procedure: TOTAL HIP ARTHROPLASTY-DIRECT  ANTERIOR;  Surgeon: [name], MD;  Location: [place];  Service: Orthopedics;  Laterality: Right;  needs RNFA   TOTAL KNEE ARTHROPLASTY Left 07/21/2022   Procedure: LEFT TOTAL KNEE ARTHROPLASTY;  Surgeon: [name], MD;  Location: [place];  Service: Orthopedics;  Laterality: Left;   UPPER EXTREMITY ANGIOGRAPHY Bilateral 10/11/2018   Procedure: UPPER EXTREMITY ANGIOGRAPHY;  Surgeon: [name], MD;  Location: [place] [facility];  Service: Cardiovascular;  Laterality: Bilateral;   Patient Active Problem List   Diagnosis Date Noted   Hx of total knee replacement, left 08/04/2022   Arthritis of left knee 07/21/2022   Hypertrophic cardiomyopathy ([place]) 07/10/2022   Insomnia secondary to chronic pain 12/27/2020   Insomnia due to other mental disorder 12/27/2020   Snoring 12/27/2020   Inadequate sleep hygiene 12/27/2020   S/P total right hip arthroplasty 12/18/2020   Arthritis of right hip 12/17/2020   Encounter for preoperative assessment 12/10/2020   Infection of nail bed of finger of right hand 09/15/2020   Pain in right finger(s) 08/31/2020   Ganglion of right wrist 08/31/2020   COPD mixed type ([place]) 12/26/2018   Healthcare maintenance 12/26/2018   History of fall 11/05/2018   Weight loss 11/05/2018   Weakness 11/05/2018   Gangrene of finger ([place]) 10/09/2018   Raynaud's phenomenon with gangrene ([place]) 10/09/2018   LVH (left ventricular hypertrophy) 10/09/2018   Vasospasm ([place]) 09/06/2018   Hypotension 08/08/2018   Leucocytosis 08/08/2018   Elevated troponin I level 08/08/2018   Nausea and vomiting 08/06/2018   Pneumonia 07/12/2018   COPD exacerbation ([place]) 07/11/2018   Primary osteoarthritis of both feet 12/21/2017   DDD (degenerative disc disease), lumbar 12/21/2017   Primary osteoarthritis of both knees 12/21/2017   History of bilateral carpal tunnel release 12/21/2017   DDD (degenerative disc disease), cervical 11/15/2017   Former smoker 11/15/2017   Bronchiectasis  without complication ([place]) 65/78/4696   Impingement syndrome of right shoulder 07/25/2017   Hx of fusion of cervical spine 07/25/2017   Cervical spinal stenosis 09/27/2016   Polypharmacy 06/23/2015   Hyperlipidemia 04/19/2015   Visit for preventive health examination 01/01/2015   Primary osteoarthritis involving multiple joints 01/01/2015   Bipolar affective disorder, currently depressed, moderate ([place])    Bipolar I disorder with mania ([place]) 08/17/2014   Hypertension 10/21/2013   Dizziness 03/25/2013   Medication withdrawal ([place]) 03/05/2013   Diabetes mellitus with renal manifestations, controlled ([place]) 11/10/2012   Alopecia areata 02/06/2012   Obesity (BMI 30-39.9) 02/06/2012   Renal insufficiency 07/16/2011   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    Exertional dyspnea 02/07/2011  Dyspnea on exertion 01/19/2011   DM (diabetes mellitus), type 2 ([place]) 04/22/2010   Carpal tunnel syndrome 02/02/2010   CONSTIPATION 02/02/2010   Primary osteoarthritis of both hands 02/02/2010   CATARACTS 04/13/2009   PAIN IN JOINT, ANKLE AND FOOT 09/16/2008   Eosinophilia 07/21/2008   AFFECTIVE DISORDER 04/21/2008   BACK PAIN, CHRONIC 02/12/2008   LEG PAIN 02/12/2008   ABNORMAL INVOLUNTARY MOVEMENTS 12/04/2007   POSTURAL LIGHTHEADEDNESS 11/04/2007   COLONIC POLYPS, HX OF 11/04/2007   Essential hypertension 06/17/2007   HYPERLIPIDEMIA 04/08/2007   GERD 04/08/2007   LOW BACK PAIN SYNDROME 04/08/2007   CHEST PAIN, RECURRENT 04/08/2007     THERAPY DIAG:  Acute pain of left knee  Stiffness of left knee, not elsewhere classified  Difficulty in walking, not elsewhere classified  Localized edema  Muscle weakness (generalized)  PCP: [name], MD   REFERRING PROVIDER: [name] MD   REFERRING DIAG: M17.12 (ICD-10-CM) - Unilateral primary osteoarthritis, left knee   EVAL THERAPY DIAG:  Acute pain of left knee   Stiffness of left knee, not elsewhere classified   Difficulty in  walking, not elsewhere classified   Localized edema   Muscle weakness (generalized)   Rationale for Evaluation and Treatment Rehabilitation   ONSET DATE: 07/21/22   SUBJECTIVE:    SUBJECTIVE STATEMENT: Tarry notes mid-back pain has been bothering her at night.  She reports good HEP compliance.   PERTINENT HISTORY: Lt TKA 07/21/22 PMH: COPD,lumbar DDD and fusion L4-5 2002,,cerv fusion 2017, Rt THA, DM   PAIN:  NPRS scale: 0-5/10 this week Pain location: left knee Pain description: achy, throbbing Aggravating factors: sleeping, not moving, end range flexion Relieving factors: pain meds, ice, elevation   PRECAUTIONS: None   WEIGHT BEARING RESTRICTIONS No   FALLS:  Has patient fallen in last 6 months? No   LIVING ENVIRONMENT: Lives with: lives with their family Lives in: House/apartment Stairs: No Has following equipment at home: Gilford Rile - 4 wheeled, RW, st cane, grab bars in bathroom. Raised toilet seat,  Pt stating her shower chair doesn't  fit properly.    OCCUPATION: retired   PLOF: Independent   PATIENT GOALS walk without walker, stop hurting     OBJECTIVE:     PATIENT SURVEYS:  08/31/2022 FOTO 47 (was risk adjusted 39, Goal 55) 08/08/22: FOTO intake: 51%  predicted:  55%   COGNITION: 08/08/22 Overall cognitive status: WFL               SENSATION: 08/08/22: WFL   EDEMA:  08/08/22 Rt knee circumferential: 37 centimeters                 Left knee circumferential: 45 centimeters   MUSCLE LENGTH: Hamstrings: Right 50 deg; Left 68 deg     POSTURE: rounded shoulders and forward head   PALPATION: TTP: lateral joint line and around incision site, pt also    LOWER EXTREMITY STRENGTH:   MMT Right 08/08/22 Left 08/08/22  Hip flexion 5/5 4/5  Hip extension      Hip abduction      Hip adduction      Hip internal rotation      Hip external rotation      Knee flexion 5/5 3/5  Knee extension 5/5 3/5  Ankle dorsiflexion 5/5 5/5  Ankle plantarflexion  5/5 5/5  Ankle inversion      Ankle eversion       (Blank rows = not tested)   LOWER EXTREMITY ROM:  ROM A=active P=passive Right 08/08/22 supine Left 08/08/22 supine Left 08/09/22 Supine Left 08/16/22 Left 08/22/22 supine Left 08/29/22 supine Left 08/31/2022  Hip flexion           Hip extension           Knee flexion A: 128   A: 70 P: 80   A: 77 P: 85 P: 95 A: 85 P: 94 A:90 Active supine 92  Knee extension A: 0   A: -8 P: -5 A: -2 edema limited A: -12 (seated LAQ) A:-10 P: -8 A: -8 Active supine -2   (Blank rows = not tested)      GAIT: Distance walked: 50 feet  Assistive device utilized: Environmental consultant - 4 wheeled Level of assistance: Modified independence Comments: antalgic gait pattern     TODAY'S TREATMENT: 08/31/2022 Nu Step Seat far back for extension and up close for flexion 4 minutes each Level 6 T[place]gate knee flexion 1 minute AAROM Left knee flexion (Right knee pushes Left into flexion) 10X 10 seconds Quadriceps sets with Left heel prop 10X 5 seconds  Functional Activities for stairs and sit to stand: Double leg press 75# 15X slow eccentrics (push into full extension and stretch into flexion) Single leg press left only 25# 10X (push into full extension and stretch into flexion) Sit to stand slow eccentrics 5X  Vasopneumatic L knee 10 minutes 34* Medium Pressure   08/29/22 Therex NuStep L6 x 8 min Calf stretch on slant board x 2 holding 30 sec Standing mini squats x 10 c UE support Leg Press bil 62# 2x10; LLE only 25# 2x10 Lt LAQ 3# 2x10; 5 sec hold Seated SLR: x 10 c 3#  Neuromuscular Re-edu: Standing on level surface: feet together, staggered stance each x 20 seconds c no UE support Standing on Airex: feet apart, feet together x 20 sec each with SBA Manual PROM Lt knee flexion in sitting to tolerance PROM knee extension in supine with overpressure Modalities:  Vasopneumatic 34 degrees x 10 minutes Left knee, moderate  compression   08/22/22 Therex NuStep L6 x 8 min Calf stretch on slant board x 2 holding 30 sec Leg Press bil 56# 2x10; LLE only 25# 2x10 Step ups on 4 inch step x 10  leading each LE c single UE support Lt LAQ 3# 2x10; 5 sec hold Seated SLR: 2 x 10  Manual PROM Lt knee flexion in sitting to tolerance PROM knee extension in supine with overpressure Modalities:  Vasopneumatic 34 degrees x 10 minutes Left knee, moderate compression    PATIENT EDUCATION:  Education details: HEP, POC Person educated: Patient Education method: Consulting civil engineer, Demonstration, Verbal cues, and Handouts Education comprehension: verbalized understanding, returned demonstration, and verbal cues required       HOME EXERCISE PROGRAM: Access Code: [email or web address] URL: [email or web address] Date: 08/08/2022 Prepared by: Kearney Hard   Exercises - Supine Quad Set  - 3-4 x daily - 7 x weekly - 2 sets - 10 reps - Seated Long Arc Quad  - 3-4 x daily - 7 x weekly - 2 sets - 10 reps - 3-5 seconds hold - Sit to Stand with Counter Support  - 3-4 x daily - 7 x weekly - 2 sets - 10 reps - Seated Hamstring Stretch  - 3-4 x daily - 7 x weekly - 3 reps - 30 seconds hold - Supine Heel Slide with Strap  - 3-4 x daily - 7 x weekly - 10 reps - 5-10 seconds hold   ASSESSMENT:  CLINICAL IMPRESSION: Amika is making progress with her post-surgical physical therapy.  AROM is now 2 degrees from full extension and 92 degrees of flexion.  Quadriceps strength and edema are being addressed and remain a focus of her home and clinic program.  Because of her excellent early gains with extension AROM, quadriceps strength, edema control and flexion AROM will be the priority moving forward.     OBJECTIVE IMPAIRMENTS Abnormal gait, decreased balance, decreased mobility, difficulty walking, decreased ROM, decreased strength, increased edema, and pain.    ACTIVITY LIMITATIONS bending, sitting, standing, sleeping, stairs,  transfers, bed mobility, toileting, and dressing   PARTICIPATION LIMITATIONS: cleaning, laundry, shopping, and community activity   PERSONAL FACTORS 3+ comorbidities: see above  are also affecting patient's functional outcome.    REHAB POTENTIAL: Good   CLINICAL DECISION MAKING: Stable/uncomplicated   EVALUATION COMPLEXITY: Low     GOALS: Goals reviewed with patient? Yes   Short term PT Goals (target date for Short term goals are 3 weeks 09/01/22) Patient will demonstrate independent use of home exercise program to maintain progress from in clinic treatments. Goal status: MET 08/29/22   Pt will be able to improve her left knee flexion to >/= 90 degrees actively Goal Status: MET 08/29/22   Long term PT goals (target dates for all long term goals are 12 weeks  11/03/22 )   1. Patient will demonstrate/report pain at worst less than or equal to 2/10 to facilitate minimal limitation in daily activity secondary to pain symptoms. Goal status: On Going 08/31/2022   2. Patient will demonstrate independent use of home exercise program to facilitate ability to maintain/progress functional gains from skilled physical therapy services. Goal status: On Going 08/31/2022   3. Patient will demonstrate FOTO outcome > or = 55 % to indicate reduced disability due to condition. Goal status: On Going 08/31/2022   4.  Patient will demonstrate left LE MMT 5/5 throughout to faciltiate usual transfers, stairs, squatting at [facility] for daily life.    Goal status: On Going 08/31/2022   5.  Pt will improve her 5 time sit to stand to </= 15 seconds c/s UE support.    Goal status: On Going 08/31/2022   6.  Pt will be able to navigate curb step with/without assistive device safely.   Goal status: On Going (cane) 08/31/2022    7. Pt will improve her left knee flexion to >/= 115 degrees for improved functional mobility and safely.                        A. Goal Status: On Going 08/31/2022       PLAN:   PT  FREQUENCY: 2x-3/week   PT DURATION: 12 weeks   PLANNED INTERVENTIONS: Therapeutic exercises, Therapeutic activity, Neuro Muscular re-education, Balance training, Gait training, Patient/Family education, Joint mobilization, Stair training, DME instructions, Dry Needling, Electrical stimulation, Traction, Cryotherapy, Moist heat, Taping, Ultrasound, Ionotophoresis 51m/ml Dexamethasone, and Manual therapy.  All included unless contraindicated     PLAN FOR NEXT SESSION:  Emphasis on flexion AROM, quadriceps strength and edema control.  Balance, gait and functional progressions as AROM improves.    RFarley Ly PT, MPT 08/31/22 4:28 PM   08/31/22 4:28 PM

## 2022-08-31 NOTE — Patient Instructions (Signed)
Visit Information  Thank you for taking time to visit with me today. Please don't hesitate to contact me if I can be of assistance to you.   Following are the goals we discussed today:   Goals Addressed               This Visit's Progress     COMPLETED: Issues with affording Brezitri (Jan 2024) (pt-stated)        Care Coordination Interventions: Provided education to patient and/or caregiver about advanced directives Reviewed medications with patient and discussed adherence with all prescribed medications Reviewed scheduled/upcoming provider appointments including pending appointment as pt verified she has sufficient transportation Screening for signs and symptoms of depression related to chronic disease state  Assessed social determinant of health barriers Pr reports she has been informed her Markus Daft will not be covered on her current arrangements due to her income and she will have a expense starting in Jan 2024. Pt not aware of the cost yet and declined a pharmacy consult at this time. Pt will reach out to the provider;'s office for that pharmacy consult at that or contact his RN case manager for further assistance. No needs at this time for care management services.          Please call the care guide team at [phone] if you need to cancel or reschedule your appointment.   If you are experiencing a Mental Health or Behavioral Health Crisis or need someone to talk to, please call the Suicide and Crisis Lifeline: 988  The patient verbalized understanding of instructions, educational materials, and care plan provided today and DECLINED offer to receive copy of patient instructions, educational materials, and care plan.   No further follow up required: no further needs at this time  Elliot Cousin, RN Care Management Coordinator [facility] [employer] Main Office [phone]

## 2022-08-31 NOTE — Patient Outreach (Signed)
  Care Coordination   Initial Visit Note   08/31/2022 Name: Desiree [name] MRN: [id number] DOB: 1951/01/14  Desiree [name] is a 71 y.o. year old female who sees [name], Neta Mends, MD for primary care. I spoke with  [name] by phone today.  What matters to the patients health and wellness today?  May have issues with affording Breztri for next year    Goals Addressed               This Visit's Progress     COMPLETED: Issues with affording Brezitri (Jan 2024) (pt-stated)        Care Coordination Interventions: Provided education to patient and/or caregiver about advanced directives Reviewed medications with patient and discussed adherence with all prescribed medications Reviewed scheduled/upcoming provider appointments including pending appointment as pt verified she has sufficient transportation Screening for signs and symptoms of depression related to chronic disease state  Assessed social determinant of health barriers Pr reports she has been informed her Markus Daft will not be covered on her current arrangements due to her income and she will have a expense starting in Jan 2024. Pt not aware of the cost yet and declined a pharmacy consult at this time. Pt will reach out to the provider;'s office for that pharmacy consult at that or contact his RN case manager for further assistance. No needs at this time for care management services.          SDOH assessments and interventions completed:  Yes  SDOH Interventions Today    Flowsheet Row Most Recent Value  SDOH Interventions   Food Insecurity Interventions Intervention Not Indicated  Housing Interventions Intervention Not Indicated  Transportation Interventions Intervention Not Indicated  Utilities Interventions Intervention Not Indicated      Care Coordination Interventions Activated:  Yes  Care Coordination Interventions:  Yes, provided   Follow up plan: No further intervention required.   Encounter Outcome:  Pt.  Visit Completed   Elliot Cousin, RN Care Management Coordinator [facility] [employer] Main Office [phone]

## 2022-09-04 ENCOUNTER — Encounter: Payer: Medicare HMO | Admitting: Physical Therapy

## 2022-09-04 ENCOUNTER — Other Ambulatory Visit (HOSPITAL_COMMUNITY): Payer: Self-pay

## 2022-09-04 NOTE — Telephone Encounter (Signed)
Currently, Desiree [name] copay is $45 dollars. This as cost effective as any combination of inhalers.

## 2022-09-04 NOTE — Telephone Encounter (Signed)
Per benefits investigation both triple therapies of Breztri and Trelegy are currently covered for a $45.00 co-pay. Other options for separating into two inhalers to achieve triple therapy are also $45.00 so the cheapest alternative would be to stay with the triple therapy inhalers of Breztri or Trelegy.

## 2022-09-04 NOTE — Telephone Encounter (Signed)
Advise f/u with PCP for neck pain.  Can we check on most cost effective LAMA/LABA/ICS combo? Trelegy and Breztri as well as combination of 2 inhalers to achieve triple therapy?

## 2022-09-04 NOTE — Telephone Encounter (Signed)
Called and spoke with patient. She verbalized understanding of the inhalers information. I asked the patient if she had access to [facility] and she stated that she currently has an inhaler. She is aware to call us if she runs out to see if we have samples.   Nothing further needed at time of call.

## 2022-09-05 ENCOUNTER — Ambulatory Visit: Payer: Medicare HMO | Admitting: Internal Medicine

## 2022-09-05 ENCOUNTER — Encounter: Payer: Medicare HMO | Admitting: Orthopaedic Surgery
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
## 2022-09-06 ENCOUNTER — Encounter: Payer: Medicare HMO | Admitting: Rehabilitative and Restorative Service Providers"

## 2022-09-11 ENCOUNTER — Telehealth: Payer: Self-pay | Admitting: *Deleted

## 2022-09-11 MED ORDER — METHOCARBAMOL 500 MG PO TABS: 500.0000 mg | ORAL_TABLET | Freq: Three times a day (TID) | ORAL | 0 refills | Status: DC | PRN

## 2022-09-11 NOTE — Telephone Encounter (Signed)
Patient called wanting to know if she could get a refill of her muscle relaxer? Thank you.

## 2022-09-11 NOTE — Telephone Encounter (Signed)
Sent to pharmacy 

## 2022-09-12 ENCOUNTER — Ambulatory Visit (INDEPENDENT_AMBULATORY_CARE_PROVIDER_SITE_OTHER): Payer: Medicare HMO | Admitting: Orthopaedic Surgery

## 2022-09-12 ENCOUNTER — Encounter: Payer: Self-pay | Admitting: Orthopaedic Surgery

## 2022-09-12 VITALS — BP 104/64 | HR 94 | Ht 62.0 in | Wt 157.0 lb

## 2022-09-12 DIAGNOSIS — Z96652 Presence of left artificial knee joint: Secondary | ICD-10-CM

## 2022-09-12 NOTE — Progress Notes (Signed)
Post-Op Visit Note   Patient: Desiree [name]           Date of Birth: 1950-11-08           MRN: [id number] Visit Date: 09/12/2022 PCP: Madelin Headings, MD   Assessment & Plan: Follow-up left total knee arthroplasty.  She lacks about 2 degrees reaching full extension flexion is improved.  She will continue to work on range of motion she still using some muscle relaxant occasionally.  Quad strength is improved.  Recheck 1 month.  Chief Complaint:  Chief Complaint  Patient presents with   Left Knee - Routine Post Op, Follow-up    07/21/2022 Left TKA   Visit Diagnoses:  1. History of total knee arthroplasty, left     Plan: Continue therapy recheck 1 month.  Follow-Up Instructions: Return in about 1 month (around 10/12/2022).   Orders:  No orders of the defined types were placed in this encounter.  No orders of the defined types were placed in this encounter.   Imaging: No results found.  PMFS History: Patient Active Problem List   Diagnosis Date Noted   History of total knee arthroplasty, left 08/04/2022   Hypertrophic cardiomyopathy (HCC) 07/10/2022   Insomnia secondary to chronic pain 12/27/2020   Insomnia due to other mental disorder 12/27/2020   Snoring 12/27/2020   Inadequate sleep hygiene 12/27/2020   S/P total right hip arthroplasty 12/18/2020   Arthritis of right hip 12/17/2020   Encounter for preoperative assessment 12/10/2020   Infection of nail bed of finger of right hand 09/15/2020   Pain in right finger(s) 08/31/2020   Ganglion of right wrist 08/31/2020   COPD mixed type (HCC) 12/26/2018   Healthcare maintenance 12/26/2018   History of fall 11/05/2018   Weight loss 11/05/2018   Weakness 11/05/2018   Gangrene of finger (HCC) 10/09/2018   Raynaud's phenomenon with gangrene (HCC) 10/09/2018   LVH (left ventricular hypertrophy) 10/09/2018   Vasospasm (HCC) 09/06/2018   Hypotension 08/08/2018   Leucocytosis 08/08/2018   Elevated troponin I level  08/08/2018   Nausea and vomiting 08/06/2018   Pneumonia 07/12/2018   COPD exacerbation (HCC) 07/11/2018   Primary osteoarthritis of both feet 12/21/2017   DDD (degenerative disc disease), lumbar 12/21/2017   Primary osteoarthritis of both knees 12/21/2017   History of bilateral carpal tunnel release 12/21/2017   DDD (degenerative disc disease), cervical 11/15/2017   Former smoker 11/15/2017   Bronchiectasis without complication (HCC) 10/25/2017   Impingement syndrome of right shoulder 07/25/2017   Hx of fusion of cervical spine 07/25/2017   Cervical spinal stenosis 09/27/2016   Polypharmacy 06/23/2015   Hyperlipidemia 04/19/2015   Visit for preventive health examination 01/01/2015   Primary osteoarthritis involving multiple joints 01/01/2015   Bipolar affective disorder, currently depressed, moderate (HCC)    Bipolar I disorder with mania (HCC) 08/17/2014   Hypertension 10/21/2013   Dizziness 03/25/2013   Medication withdrawal (HCC) 03/05/2013   Diabetes mellitus with renal manifestations, controlled (HCC) 11/10/2012   Alopecia areata 02/06/2012   Obesity (BMI 30-39.9) 02/06/2012   Renal insufficiency 07/16/2011   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    Exertional dyspnea 02/07/2011   Dyspnea on exertion 01/19/2011   DM (diabetes mellitus), type 2 (HCC) 04/22/2010   Carpal tunnel syndrome 02/02/2010   CONSTIPATION 02/02/2010   Primary osteoarthritis of both hands 02/02/2010   CATARACTS 04/13/2009   PAIN IN JOINT, ANKLE AND FOOT 09/16/2008   Eosinophilia 07/21/2008   AFFECTIVE DISORDER 04/21/2008   BACK  PAIN, CHRONIC 02/12/2008   LEG PAIN 02/12/2008   ABNORMAL INVOLUNTARY MOVEMENTS 12/04/2007   POSTURAL LIGHTHEADEDNESS 11/04/2007   COLONIC POLYPS, HX OF 11/04/2007   Essential hypertension 06/17/2007   HYPERLIPIDEMIA 04/08/2007   GERD 04/08/2007   LOW BACK PAIN SYNDROME 04/08/2007   CHEST PAIN, RECURRENT 04/08/2007   Past Medical History:  Diagnosis Date   Acute gastritis  without bleeding 08/08/2018   Anemia    Anxiety    Asthma    Bipolar disorder (HCC)    CANDIDIASIS, ESOPHAGEAL 07/28/2009   Qualifier: Diagnosis of  By: Fabian Sharp MD, Neta Mends    Chronic kidney disease    CKD III   Complication of anesthesia    was told she stopped breathing for one of her finger surgeries   COPD (chronic obstructive pulmonary disease) (HCC)    Depression    Diabetes mellitus without complication (HCC)    no meds   Dyspnea    At rest,and with activity   FH: colonic polyps    Fractured elbow    right    GERD (gastroesophageal reflux disease)    Headache    migraines   HH (hiatus hernia)    History of carpal tunnel syndrome    History of chest pain    History of transfusion of packed red blood cells    Hyperlipidemia    Hypertension    Mitral valve prolapse    Neuroleptic-induced tardive dyskinesia    Osteoarthritis of more than one site    Pneumonia    Seasonal allergies    Stroke [facility])     Family History  Problem Relation Age of Onset   Diabetes Mother    Hypertension Mother    Heart attack Mother    Heart attack Father    Heart disease Father    Throat cancer Brother    Diabetes type II Brother    Heart disease Brother    Lung cancer Brother    Lung cancer Paternal Uncle    Lung cancer Daughter    Breast cancer Cousin     Past Surgical History:  Procedure Laterality Date   AMPUTATION Left 10/14/2018   Procedure: AMPUTATION LEFT LONG FINGER TIP;  Surgeon: Knute Neu, MD;  Location: [facility] OR;  Service: Plastics;  Laterality: Left;   ANTERIOR CERVICAL DECOMP/DISCECTOMY FUSION  09/27/2016   C5-6 anterior cervical discectomy and fusion, allograft and plate/notes 26/06/4853   ANTERIOR CERVICAL DECOMP/DISCECTOMY FUSION N/A 09/27/2016   Procedure: C5-6 Anterior Cervical Discectomy and Fusion, Allograft and Plate;  Surgeon: Eldred Manges, MD;  Location: [facility] OR;  Service: Orthopedics;  Laterality: N/A;   Back Fusion  2002   BIOPSY  07/19/2018   Procedure:  BIOPSY;  Surgeon: Jeani Hawking, MD;  Location: [facility] ENDOSCOPY;  Service: Endoscopy;;   BIOPSY  02/13/2019   Procedure: BIOPSY;  Surgeon: Jeani Hawking, MD;  Location: [facility] ENDOSCOPY;  Service: Endoscopy;;   CARPAL TUNNEL RELEASE  [place]   left   COLONOSCOPY N/A 01/05/2014   Procedure: COLONOSCOPY;  Surgeon: Charna Elizabeth, MD;  Location: [facility] ENDOSCOPY;  Service: Endoscopy;  Laterality: N/A;   ELBOW SURGERY     age 23   ENTEROSCOPY N/A 02/13/2019   Procedure: ENTEROSCOPY;  Surgeon: Jeani Hawking, MD;  Location: [facility] ENDOSCOPY;  Service: Endoscopy;  Laterality: N/A;   ESOPHAGOGASTRODUODENOSCOPY (EGD) WITH PROPOFOL N/A 07/19/2018   Procedure: ESOPHAGOGASTRODUODENOSCOPY (EGD) WITH PROPOFOL;  Surgeon: Jeani Hawking, MD;  Location[facility] ENDOSCOPY;  Service: Endoscopy;  Laterality: N/A;   EXTERNAL EAR SURGERY  Left    EYE SURGERY     "removed white dots under eyelid"   FINGER SURGERY Left    INJECTION KNEE Right 07/21/2022   Procedure: INTRA-ARTICULAR INJECTION UNDER ANESTHESIA;  Surgeon: Eldred Manges, MD;  Location[facility];  Service: Orthopedics;  Laterality: Right;   Juvara osteomy     KNEE SURGERY     NOSE SURGERY     Rt. toe bunion     skin, shave biopsy  05/03/2016   Left occipital scalp, top of scalp   TONSILLECTOMY     TOTAL HIP ARTHROPLASTY Right 12/17/2020   TOTAL HIP ARTHROPLASTY Right 12/17/2020   Procedure: TOTAL HIP ARTHROPLASTY-DIRECT ANTERIOR;  Surgeon: Eldred Manges, MD;  Location: [facility] OR;  Service: Orthopedics;  Laterality: Right;  needs RNFA   TOTAL KNEE ARTHROPLASTY Left 07/21/2022   Procedure: LEFT TOTAL KNEE ARTHROPLASTY;  Surgeon: Eldred Manges, MD;  Location: [facility] OR;  Service: Orthopedics;  Laterality: Left;   UPPER EXTREMITY ANGIOGRAPHY Bilateral 10/11/2018   Procedure: UPPER EXTREMITY ANGIOGRAPHY;  Surgeon: Sherren Kerns, MD;  Location: [facility] INVASIVE CV LAB;  Service: Cardiovascular;  Laterality: Bilateral;   Social History   Occupational History   Not on file  Tobacco Use    Smoking status: Former    Packs/day: 2.00    Years: 30.00    Total pack years: 60.00    Types: Cigarettes    Quit date: 10/23/2001    Years since quitting: 20.9    Passive exposure: Past   Smokeless tobacco: Never  Vaping Use   Vaping Use: Never used  Substance and Sexual Activity   Alcohol use: No   Drug use: No   Sexual activity: Not on file

## 2022-09-13 ENCOUNTER — Encounter: Payer: Self-pay | Admitting: Rehabilitative and Restorative Service Providers"

## 2022-09-13 ENCOUNTER — Ambulatory Visit (INDEPENDENT_AMBULATORY_CARE_PROVIDER_SITE_OTHER): Payer: Medicare HMO | Admitting: Rehabilitative and Restorative Service Providers"

## 2022-09-13 DIAGNOSIS — M25562 Pain in left knee: Secondary | ICD-10-CM

## 2022-09-13 DIAGNOSIS — R600 Localized edema: Secondary | ICD-10-CM | POA: Diagnosis not present

## 2022-09-13 DIAGNOSIS — M25662 Stiffness of left knee, not elsewhere classified: Secondary | ICD-10-CM

## 2022-09-13 DIAGNOSIS — R262 Difficulty in walking, not elsewhere classified: Secondary | ICD-10-CM | POA: Diagnosis not present

## 2022-09-13 DIAGNOSIS — M6281 Muscle weakness (generalized): Secondary | ICD-10-CM | POA: Diagnosis not present

## 2022-09-13 NOTE — Therapy (Signed)
OUTPATIENT PHYSICAL THERAPY TREATMENT NOTE   Patient Name: Desiree [name] MRN: [id number] DOB:26-May-1951, 71 y.o., female Today's Date: 09/13/2022  END OF SESSION:   PT End of Session - 09/13/22 1441     Visit Number 7    Number of Visits 24    Date for PT Re-Evaluation 11/03/22    Authorization Type humana requested 12 visit    Authorization Time Period 10/17-1/12    Authorization - Visit Number 7    Authorization - Number of Visits 12    Progress Note Due on Visit 10    PT Start Time 1426    PT Stop Time 1516    PT Time Calculation (min) 50 min    Activity Tolerance Patient tolerated treatment well    Behavior During Therapy [facility] for tasks assessed/performed             Progress Note Reporting Period 08/08/2022 to 08/31/2022  See note below for Objective Data and Assessment of Progress/Goals.    Past Medical History:  Diagnosis Date   Acute gastritis without bleeding 08/08/2018   Anemia    Anxiety    Asthma    Bipolar disorder ([place])    CANDIDIASIS, ESOPHAGEAL 07/28/2009   Qualifier: Diagnosis of  By: [name] MD, [name]    Chronic kidney disease    CKD III   Complication of anesthesia    was told she stopped breathing for one of her finger surgeries   COPD (chronic obstructive pulmonary disease) ([place])    Depression    Diabetes mellitus without complication (HCC)    no meds   Dyspnea    At rest,and with activity   FH: colonic polyps    Fractured elbow    right    GERD (gastroesophageal reflux disease)    Headache    migraines   HH (hiatus hernia)    History of carpal tunnel syndrome    History of chest pain    History of transfusion of packed red blood cells    Hyperlipidemia    Hypertension    Mitral valve prolapse    Neuroleptic-induced tardive dyskinesia    Osteoarthritis of more than one site    Pneumonia    Seasonal allergies    Stroke [facility])    Past Surgical History:  Procedure Laterality Date   AMPUTATION Left 10/14/2018   Procedure:  AMPUTATION LEFT LONG FINGER TIP;  Surgeon: Dayna Barker, MD;  Location: [place];  Service: Plastics;  Laterality: Left;   ANTERIOR CERVICAL DECOMP/DISCECTOMY FUSION  09/27/2016   C5-6 anterior cervical discectomy and fusion, allograft and plate/notes 09/27/2016   ANTERIOR CERVICAL DECOMP/DISCECTOMY FUSION N/A 09/27/2016   Procedure: C5-6 Anterior Cervical Discectomy and Fusion, Allograft and Plate;  Surgeon: [name], MD;  Location: [place];  Service: Orthopedics;  Laterality: N/A;   Back Fusion  2002   BIOPSY  07/19/2018   Procedure: BIOPSY;  Surgeon: [name], MD;  Location: [facility] ENDOSCOPY;  Service: Endoscopy;;   BIOPSY  02/13/2019   Procedure: BIOPSY;  Surgeon: [name], MD;  Location: [facility] ENDOSCOPY;  Service: Endoscopy;;   CARPAL TUNNEL RELEASE  yates   left   COLONOSCOPY N/A 01/05/2014   Procedure: COLONOSCOPY;  Surgeon: [name], MD;  Location: [facility] ENDOSCOPY;  Service: Endoscopy;  Laterality: N/A;   ELBOW SURGERY     age 70   ENTEROSCOPY N/A 02/13/2019   Procedure: ENTEROSCOPY;  Surgeon: [name], MD;  Location: [facility] ENDOSCOPY;  Service: Endoscopy;  Laterality: N/A;  ESOPHAGOGASTRODUODENOSCOPY (EGD) WITH PROPOFOL N/A 07/19/2018   Procedure: ESOPHAGOGASTRODUODENOSCOPY (EGD) WITH PROPOFOL;  Surgeon: [name], MD;  Location: [place];  Service: Endoscopy;  Laterality: N/A;   EXTERNAL EAR SURGERY Left    EYE SURGERY     "removed white dots under eyelid"   FINGER SURGERY Left    INJECTION KNEE Right 07/21/2022   Procedure: INTRA-ARTICULAR INJECTION UNDER ANESTHESIA;  Surgeon: [name], MD;  Location: [place];  Service: Orthopedics;  Laterality: Right;   Juvara osteomy     KNEE SURGERY     NOSE SURGERY     Rt. toe bunion     skin, shave biopsy  05/03/2016   Left occipital scalp, top of scalp   TONSILLECTOMY     TOTAL HIP ARTHROPLASTY Right 12/17/2020   TOTAL HIP ARTHROPLASTY Right 12/17/2020   Procedure: TOTAL HIP ARTHROPLASTY-DIRECT ANTERIOR;  Surgeon: [name], MD;  Location: [place];  Service: Orthopedics;  Laterality: Right;  needs RNFA   TOTAL KNEE ARTHROPLASTY Left 07/21/2022   Procedure: LEFT TOTAL KNEE ARTHROPLASTY;  Surgeon: [name], MD;  Location: [place];  Service: Orthopedics;  Laterality: Left;   UPPER EXTREMITY ANGIOGRAPHY Bilateral 10/11/2018   Procedure: UPPER EXTREMITY ANGIOGRAPHY;  Surgeon: [name], MD;  Location: [place] CV LAB;  Service: Cardiovascular;  Laterality: Bilateral;   Patient Active Problem List   Diagnosis Date Noted   History of total knee arthroplasty, left 08/04/2022   Hypertrophic cardiomyopathy ([place]) 07/10/2022   Insomnia secondary to chronic pain 12/27/2020   Insomnia due to other mental disorder 12/27/2020   Snoring 12/27/2020   Inadequate sleep hygiene 12/27/2020   S/P total right hip arthroplasty 12/18/2020   Arthritis of right hip 12/17/2020   Encounter for preoperative assessment 12/10/2020   Infection of nail bed of finger of right hand 09/15/2020   Pain in right finger(s) 08/31/2020   Ganglion of right wrist 08/31/2020   COPD mixed type ([place]) 12/26/2018   Healthcare maintenance 12/26/2018   History of fall 11/05/2018   Weight loss 11/05/2018   Weakness 11/05/2018   Gangrene of finger ([place]) 10/09/2018   Raynaud's phenomenon with gangrene ([place]) 10/09/2018   LVH (left ventricular hypertrophy) 10/09/2018   Vasospasm ([place]) 09/06/2018   Hypotension 08/08/2018   Leucocytosis 08/08/2018   Elevated troponin I level 08/08/2018   Nausea and vomiting 08/06/2018   Pneumonia 07/12/2018   COPD exacerbation ([place]) 07/11/2018   Primary osteoarthritis of both feet 12/21/2017   DDD (degenerative disc disease), lumbar 12/21/2017   Primary osteoarthritis of both knees 12/21/2017   History of bilateral carpal tunnel release 12/21/2017   DDD (degenerative disc disease), cervical 11/15/2017   Former smoker 11/15/2017   Bronchiectasis without complication ([place]) 09/62/8366   Impingement  syndrome of right shoulder 07/25/2017   Hx of fusion of cervical spine 07/25/2017   Cervical spinal stenosis 09/27/2016   Polypharmacy 06/23/2015   Hyperlipidemia 04/19/2015   Visit for preventive health examination 01/01/2015   Primary osteoarthritis involving multiple joints 01/01/2015   Bipolar affective disorder, currently depressed, moderate ([place])    Bipolar I disorder with mania ([place]) 08/17/2014   Hypertension 10/21/2013   Dizziness 03/25/2013   Medication withdrawal ([place]) 03/05/2013   Diabetes mellitus with renal manifestations, controlled ([place]) 11/10/2012   Alopecia areata 02/06/2012   Obesity (BMI 30-39.9) 02/06/2012   Renal insufficiency 07/16/2011   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    Exertional dyspnea 02/07/2011   Dyspnea on exertion 01/19/2011   DM (diabetes mellitus),  type 2 ([place]) 04/22/2010   Carpal tunnel syndrome 02/02/2010   CONSTIPATION 02/02/2010   Primary osteoarthritis of both hands 02/02/2010   CATARACTS 04/13/2009   PAIN IN JOINT, ANKLE AND FOOT 09/16/2008   Eosinophilia 07/21/2008   AFFECTIVE DISORDER 04/21/2008   BACK PAIN, CHRONIC 02/12/2008   LEG PAIN 02/12/2008   ABNORMAL INVOLUNTARY MOVEMENTS 12/04/2007   POSTURAL LIGHTHEADEDNESS 11/04/2007   COLONIC POLYPS, HX OF 11/04/2007   Essential hypertension 06/17/2007   HYPERLIPIDEMIA 04/08/2007   GERD 04/08/2007   LOW BACK PAIN SYNDROME 04/08/2007   CHEST PAIN, RECURRENT 04/08/2007     THERAPY DIAG:  Acute pain of left knee  Stiffness of left knee, not elsewhere classified  Difficulty in walking, not elsewhere classified  Localized edema  Muscle weakness (generalized)  PCP: [name], MD   REFERRING PROVIDER: [name] MD   REFERRING DIAG: M17.12 (ICD-10-CM) - Unilateral primary osteoarthritis, left knee   EVAL THERAPY DIAG:  Acute pain of left knee   Stiffness of left knee, not elsewhere classified   Difficulty in walking, not elsewhere classified   Localized edema    Muscle weakness (generalized)   Rationale for Evaluation and Treatment Rehabilitation   ONSET DATE: 07/21/22   SUBJECTIVE:    SUBJECTIVE STATEMENT: Pt indicated swelling at night and knee hurts/locks at times.  Pt indicated most of her pain at night.  Pt indicated having cramp in stomach at times with knee tightens up.    PERTINENT HISTORY: Lt TKA 07/21/22 PMH: COPD,lumbar DDD and fusion L4-5 2002,,cerv fusion 2017, Rt THA, DM   PAIN:  NPRS scale: 0/10 upon arrival, 4/10 at worst today Pain location: left knee Pain description: achy, throbbing Aggravating factors: nighttime, tightness Relieving factors: pain meds, ice, elevation   PRECAUTIONS: None   WEIGHT BEARING RESTRICTIONS No   FALLS:  Has patient fallen in last 6 months? No   LIVING ENVIRONMENT: Lives with: lives with their family Lives in: House/apartment Stairs: No Has following equipment at home: Gilford Rile - 4 wheeled, RW, st cane, grab bars in bathroom. Raised toilet seat,  Pt stating her shower chair doesn't  fit properly.    OCCUPATION: retired   PLOF: Independent   PATIENT GOALS walk without walker, stop hurting     OBJECTIVE:     PATIENT SURVEYS:  08/31/2022 FOTO 47 (was risk adjusted 39, Goal 55) 08/08/22: FOTO intake: 51%  predicted:  55%   COGNITION: 08/08/22 Overall cognitive status: WFL               SENSATION: 08/08/22: WFL   EDEMA:  08/08/22 Rt knee circumferential: 37 centimeters                 Left knee circumferential: 45 centimeters   MUSCLE LENGTH: Hamstrings: Right 50 deg; Left 68 deg     POSTURE: rounded shoulders and forward head   PALPATION: TTP: lateral joint line and around incision site, pt also    LOWER EXTREMITY STRENGTH:   MMT Right 08/08/22 Left 08/08/22  Hip flexion 5/5 4/5  Hip extension      Hip abduction      Hip adduction      Hip internal rotation      Hip external rotation      Knee flexion 5/5 3/5  Knee extension 5/5 3/5  Ankle dorsiflexion 5/5  5/5  Ankle plantarflexion 5/5 5/5  Ankle inversion      Ankle eversion       (Blank rows = not  tested)   LOWER EXTREMITY ROM:   ROM A=active P=passive Right 08/08/22 supine Left 08/08/22 supine Left 08/09/22 Supine Left 08/16/22 Left 08/22/22 supine Left 08/29/22 supine Left 08/31/2022 Left 09/13/2022  Hip flexion            Hip extension            Knee flexion A: 128   A: 70 P: 80   A: 77 P: 85 P: 95 A: 85 P: 94 A:90 Active supine 92 AROM in supine heel slide 95  Knee extension A: 0   A: -8 P: -5 A: -2 edema limited A: -12 (seated LAQ) A:-10 P: -8 A: -8 Active supine -2 Seated AROM LAQ : -4  Active quad set supine: -2   (Blank rows = not tested)      GAIT: 09/13/2022 : independent ambulation into clinic c decreased stance, lacking full TKE in stance on Lt leg  08/08/2022 Distance walked: 50 feet  Assistive device utilized: Environmental consultant - 4 wheeled Level of assistance: Modified independence Comments: antalgic gait pattern     TODAY'S TREATMENT: 09/13/2022 Therex: Nu Step Seat far back for extension and up close for flexion 4 minutes each Level 6 AAROM Left knee flexion (Right knee pushes Left into flexion) 10X 10 seconds Quadriceps sets with Left heel prop 5 X 5 seconds Double leg press 81 lbs 15X slow eccentrics Single leg press , performed bilaterally 25# 15X  Step up 4 inch Lt leg loading with single hand assist x 15 Incline gastroc stretch bilateral 30 sec x 3 standing Supine AROM heel slide 5 sec hold x 5  Manual Seated Lt knee flexion c distraction/IR mobilization c movement.  Mid range g2-g3 IR/ER proximal tibia mobs  Vasopneumatic Lt knee 10 minutes 34* Medium Pressure  08/31/2022 Nu Step Seat far back for extension and up close for flexion 4 minutes each Level 6 [place] knee flexion 1 minute AAROM Left knee flexion (Right knee pushes Left into flexion) 10X 10 seconds Quadriceps sets with Left heel prop 10X 5 seconds  Functional Activities for  stairs and sit to stand: Double leg press 75# 15X slow eccentrics (push into full extension and stretch into flexion) Single leg press left only 25# 10X (push into full extension and stretch into flexion) Sit to stand slow eccentrics 5X  Vasopneumatic L knee 10 minutes 34* Medium Pressure   08/29/22 Therex NuStep L6 x 8 min Calf stretch on slant board x 2 holding 30 sec Standing mini squats x 10 c UE support Leg Press bil 62# 2x10; LLE only 25# 2x10 Lt LAQ 3# 2x10; 5 sec hold Seated SLR: x 10 c 3#  Neuromuscular Re-edu: Standing on level surface: feet together, staggered stance each x 20 seconds c no UE support Standing on Airex: feet apart, feet together x 20 sec each with SBA Manual PROM Lt knee flexion in sitting to tolerance PROM knee extension in supine with overpressure Modalities:  Vasopneumatic 34 degrees x 10 minutes Left knee, moderate compression     PATIENT EDUCATION:  Education details: HEP, POC Person educated: Patient Education method: Consulting civil engineer, Demonstration, Verbal cues, and Handouts Education comprehension: verbalized understanding, returned demonstration, and verbal cues required       HOME EXERCISE PROGRAM: Access Code: [email or web address] URL: [email or web address] Date: 08/08/2022 Prepared by: Kearney Hard   Exercises - Supine Quad Set  - 3-4 x daily - 7 x weekly - 2 sets - 10 reps - Seated Long Arc Quad  - 3-4 x daily -  7 x weekly - 2 sets - 10 reps - 3-5 seconds hold - Sit to Stand with Counter Support  - 3-4 x daily - 7 x weekly - 2 sets - 10 reps - Seated Hamstring Stretch  - 3-4 x daily - 7 x weekly - 3 reps - 30 seconds hold - Supine Heel Slide with Strap  - 3-4 x daily - 7 x weekly - 10 reps - 5-10 seconds hold   ASSESSMENT:   CLINICAL IMPRESSION: Continued difficulty and pain in flexion mobility at this time.  Pt would benefit from continued skilled PT services c increased frequency as POC stated to ensure gains in mobility and  strength for functional activity.    OBJECTIVE IMPAIRMENTS Abnormal gait, decreased balance, decreased mobility, difficulty walking, decreased ROM, decreased strength, increased edema, and pain.    ACTIVITY LIMITATIONS bending, sitting, standing, sleeping, stairs, transfers, bed mobility, toileting, and dressing   PARTICIPATION LIMITATIONS: cleaning, laundry, shopping, and community activity   PERSONAL FACTORS 3+ comorbidities: see above  are also affecting patient's functional outcome.    REHAB POTENTIAL: Good   CLINICAL DECISION MAKING: Stable/uncomplicated   EVALUATION COMPLEXITY: Low     GOALS: Goals reviewed with patient? Yes   Short term PT Goals (target date for Short term goals are 3 weeks 09/01/22) Patient will demonstrate independent use of home exercise program to maintain progress from in clinic treatments. Goal status: MET 08/29/22   Pt will be able to improve her left knee flexion to >/= 90 degrees actively Goal Status: MET 08/29/22   Long term PT goals (target dates for all long term goals are 12 weeks  11/03/22 )   1. Patient will demonstrate/report pain at worst less than or equal to 2/10 to facilitate minimal limitation in daily activity secondary to pain symptoms. Goal status: On Going 08/31/2022   2. Patient will demonstrate independent use of home exercise program to facilitate ability to maintain/progress functional gains from skilled physical therapy services. Goal status: On Going 08/31/2022   3. Patient will demonstrate FOTO outcome > or = 55 % to indicate reduced disability due to condition. Goal status: On Going 08/31/2022   4.  Patient will demonstrate left LE MMT 5/5 throughout to faciltiate usual transfers, stairs, squatting at [facility] for daily life.    Goal status: On Going 08/31/2022   5.  Pt will improve her 5 time sit to stand to </= 15 seconds c/s UE support.    Goal status: On Going 08/31/2022   6.  Pt will be able to navigate curb step  with/without assistive device safely.   Goal status: On Going (cane) 08/31/2022    7. Pt will improve her left knee flexion to >/= 115 degrees for improved functional mobility and safely.                        A. Goal Status: On Going 08/31/2022       PLAN:   PT FREQUENCY: 2x-3/week   PT DURATION: 12 weeks   PLANNED INTERVENTIONS: Therapeutic exercises, Therapeutic activity, Neuro Muscular re-education, Balance training, Gait training, Patient/Family education, Joint mobilization, Stair training, DME instructions, Dry Needling, Electrical stimulation, Traction, Cryotherapy, Moist heat, Taping, Ultrasound, Ionotophoresis 31m/ml Dexamethasone, and Manual therapy.  All included unless contraindicated     PLAN FOR NEXT SESSION:  Priority of manual and therex for mobility improvements in flexion. Quad strengthening.   MScot Jun PT, DPT, OCS, ATC 09/13/22  3:20 PM

## 2022-09-18 ENCOUNTER — Telehealth: Payer: Self-pay | Admitting: Pharmacist

## 2022-09-18 NOTE — Chronic Care Management (AMB) (Signed)
Received a call from patient reporting she is needing her PCP to contact [facility] to get her diabetic shoes and to request refills be sent to [facility] for her Zofran, advised her I would forward that information to her provider. Per MP offered to make an appointment to educate on BP cuff and glucometer, pt declined as she is coming into the office on the 26 th to see PCP she reports she will try and remember to bring it then as she has lost a lot of weight and her cuff slides off of her arm, task created to call and remind her prior to appointment.     Pamala Duffel CMA Clinical Pharmacist Assistant [phone]

## 2022-09-19 ENCOUNTER — Other Ambulatory Visit: Payer: Self-pay

## 2022-09-20 ENCOUNTER — Ambulatory Visit (INDEPENDENT_AMBULATORY_CARE_PROVIDER_SITE_OTHER): Payer: Medicare HMO | Admitting: Podiatry

## 2022-09-20 ENCOUNTER — Telehealth: Payer: Self-pay | Admitting: Internal Medicine

## 2022-09-20 ENCOUNTER — Telehealth: Payer: Self-pay | Admitting: Podiatry

## 2022-09-20 DIAGNOSIS — B351 Tinea unguium: Secondary | ICD-10-CM | POA: Diagnosis not present

## 2022-09-20 DIAGNOSIS — E1169 Type 2 diabetes mellitus with other specified complication: Secondary | ICD-10-CM

## 2022-09-20 DIAGNOSIS — L84 Corns and callosities: Secondary | ICD-10-CM | POA: Diagnosis not present

## 2022-09-20 DIAGNOSIS — E1151 Type 2 diabetes mellitus with diabetic peripheral angiopathy without gangrene: Secondary | ICD-10-CM | POA: Diagnosis not present

## 2022-09-20 MED ORDER — ONDANSETRON 4 MG PO TBDP: ORAL_TABLET | ORAL | 1 refills | Status: AC

## 2022-09-20 NOTE — Telephone Encounter (Signed)
Patient dropped off paperwork needing to be completed by Dr.[name]. Charge form was completed and forms were placed in folder for completion.      Please advise

## 2022-09-20 NOTE — Telephone Encounter (Signed)
Paperwork give to Dr Lilian Kapur to give to patient,  paperwork needs signed by Dr Fabian Sharp for patient to receive her diabetic shoes.

## 2022-09-20 NOTE — Progress Notes (Signed)
  Subjective:  [name], female    DOB: 12-04-1950,  MRN: [id number]  Chief Complaint  Patient presents with   Nail Problem    Thick painful toenails, 3 month follow up    71 y.o. female presents with the above complaint. History confirmed with patient.  She presents with painful calluses and a painful nail, her diabetes is well controlled she says  Objective:  Physical Exam: warm, good capillary refill, no trophic changes or ulcerative lesions, weakly palpable DP and PT pulses, and thickened elongated mycotic right second toenail, there are multiple hyperkeratotic painful lesions as follows: Right fifth toe, second medial toe, submetatarsal 5 and submetatarsal 2: Left submetatarsal 1 and submetatarsal 5.  Assessment:   1. Callus   2. Onychomycosis of multiple toenails with type 2 diabetes mellitus and peripheral angiopathy (HCC)   3. Diabetes mellitus type 2 with peripheral artery disease (HCC)      Plan:  Patient was evaluated and treated and all questions answered.  Patient educated on diabetes. Discussed proper diabetic foot care and discussed risks and complications of disease. Educated patient in depth on reasons to return to the office immediately should he/she discover anything concerning or new on the feet. All questions answered. Discussed proper shoes as well.   Discussed the etiology and treatment options for the condition in detail with the patient. Educated patient on the topical and oral treatment options for mycotic nails. Recommended debridement of the nails today. Sharp and mechanical debridement performed of all painful and mycotic nails today. Nails debrided in length and thickness using a nail nipper to level of comfort. Discussed treatment options including appropriate shoe gear. Follow up as needed for painful nails.   All symptomatic hyperkeratoses were safely debrided with a sterile #15 blade to patient's level of comfort without incident. We  discussed preventative and palliative care of these lesions including supportive and accommodative shoegear, padding, prefabricated and custom molded accommodative orthoses, use of a pumice stone and lotions/creams daily.  She was given paperwork to take to her PCP today to fill out for her diabetic shoes so that we can order these as soon as they are completed  Return in about 4 months (around 01/19/2023) for at risk diabetic foot care.

## 2022-09-21 NOTE — Telephone Encounter (Signed)
Spoke to patient and inform her of the message. Verbalized understanding. Pt has appt on 12/26 with Dr. Fabian Sharp.  Reach out to [facility] and spoke to [place]. She informs that  the form has to be sign off by PCP before sending the order out. Made her aware that patient has appt with Dr. Fabian Sharp in December.

## 2022-09-22 ENCOUNTER — Encounter: Payer: Self-pay | Admitting: Physical Therapy

## 2022-09-22 ENCOUNTER — Ambulatory Visit (INDEPENDENT_AMBULATORY_CARE_PROVIDER_SITE_OTHER): Payer: Medicare HMO | Admitting: Physical Therapy

## 2022-09-22 DIAGNOSIS — R262 Difficulty in walking, not elsewhere classified: Secondary | ICD-10-CM

## 2022-09-22 DIAGNOSIS — R600 Localized edema: Secondary | ICD-10-CM

## 2022-09-22 DIAGNOSIS — M6281 Muscle weakness (generalized): Secondary | ICD-10-CM

## 2022-09-22 DIAGNOSIS — M25562 Pain in left knee: Secondary | ICD-10-CM

## 2022-09-22 DIAGNOSIS — M25662 Stiffness of left knee, not elsewhere classified: Secondary | ICD-10-CM | POA: Diagnosis not present

## 2022-09-22 NOTE — Therapy (Signed)
OUTPATIENT PHYSICAL THERAPY TREATMENT NOTE   Patient Name: Desiree [name] MRN: [id number] DOB:May 07, 1951, 71 y.o., female Today's Date: 09/22/2022  END OF SESSION:   PT End of Session - 09/22/22 1441     Visit Number 8    Number of Visits 24    Date for PT Re-Evaluation 11/03/22    Authorization Type humana requested 12 visit    Authorization Time Period 10/17-1/12    Authorization - Visit Number 8    PT Start Time 7425    PT Stop Time 1512    PT Time Calculation (min) 39 min    Activity Tolerance Patient tolerated treatment well    Behavior During Therapy [facility] for tasks assessed/performed                 Past Medical History:  Diagnosis Date   Acute gastritis without bleeding 08/08/2018   Anemia    Anxiety    Asthma    Bipolar disorder ([place])    CANDIDIASIS, ESOPHAGEAL 07/28/2009   Qualifier: Diagnosis of  By: [name] MD, [name]    Chronic kidney disease    CKD III   Complication of anesthesia    was told she stopped breathing for one of her finger surgeries   COPD (chronic obstructive pulmonary disease) ([place])    Depression    Diabetes mellitus without complication (HCC)    no meds   Dyspnea    At rest,and with activity   FH: colonic polyps    Fractured elbow    right    GERD (gastroesophageal reflux disease)    Headache    migraines   HH (hiatus hernia)    History of carpal tunnel syndrome    History of chest pain    History of transfusion of packed red blood cells    Hyperlipidemia    Hypertension    Mitral valve prolapse    Neuroleptic-induced tardive dyskinesia    Osteoarthritis of more than one site    Pneumonia    Seasonal allergies    Stroke [facility])    Past Surgical History:  Procedure Laterality Date   AMPUTATION Left 10/14/2018   Procedure: AMPUTATION LEFT LONG FINGER TIP;  Surgeon: Dayna Barker, MD;  Location: [place];  Service: Plastics;  Laterality: Left;   ANTERIOR CERVICAL DECOMP/DISCECTOMY FUSION  09/27/2016   C5-6 anterior cervical  discectomy and fusion, allograft and plate/notes 09/27/2016   ANTERIOR CERVICAL DECOMP/DISCECTOMY FUSION N/A 09/27/2016   Procedure: C5-6 Anterior Cervical Discectomy and Fusion, Allograft and Plate;  Surgeon: [name], MD;  Location: [place];  Service: Orthopedics;  Laterality: N/A;   Back Fusion  2002   BIOPSY  07/19/2018   Procedure: BIOPSY;  Surgeon: [name], MD;  Location: [facility] ENDOSCOPY;  Service: Endoscopy;;   BIOPSY  02/13/2019   Procedure: BIOPSY;  Surgeon: [name], MD;  Location: [facility] ENDOSCOPY;  Service: Endoscopy;;   CARPAL TUNNEL RELEASE  yates   left   COLONOSCOPY N/A 01/05/2014   Procedure: COLONOSCOPY;  Surgeon: [name], MD;  Location: [facility] ENDOSCOPY;  Service: Endoscopy;  Laterality: N/A;   ELBOW SURGERY     age 80   ENTEROSCOPY N/A 02/13/2019   Procedure: ENTEROSCOPY;  Surgeon: [name], MD;  Location: [facility] ENDOSCOPY;  Service: Endoscopy;  Laterality: N/A;   ESOPHAGOGASTRODUODENOSCOPY (EGD) WITH PROPOFOL N/A 07/19/2018   Procedure: ESOPHAGOGASTRODUODENOSCOPY (EGD) WITH PROPOFOL;  Surgeon: [name], MD;  Location: [place];  Service: Endoscopy;  Laterality: N/A;   EXTERNAL EAR SURGERY Left  EYE SURGERY     "removed white dots under eyelid"   FINGER SURGERY Left    INJECTION KNEE Right 07/21/2022   Procedure: INTRA-ARTICULAR INJECTION UNDER ANESTHESIA;  Surgeon: [name], MD;  Location: [place];  Service: Orthopedics;  Laterality: Right;   Juvara osteomy     KNEE SURGERY     NOSE SURGERY     Rt. toe bunion     skin, shave biopsy  05/03/2016   Left occipital scalp, top of scalp   TONSILLECTOMY     TOTAL HIP ARTHROPLASTY Right 12/17/2020   TOTAL HIP ARTHROPLASTY Right 12/17/2020   Procedure: TOTAL HIP ARTHROPLASTY-DIRECT ANTERIOR;  Surgeon: [name], MD;  Location: [place];  Service: Orthopedics;  Laterality: Right;  needs RNFA   TOTAL KNEE ARTHROPLASTY Left 07/21/2022   Procedure: LEFT TOTAL KNEE ARTHROPLASTY;  Surgeon: [name], MD;   Location: [place];  Service: Orthopedics;  Laterality: Left;   UPPER EXTREMITY ANGIOGRAPHY Bilateral 10/11/2018   Procedure: UPPER EXTREMITY ANGIOGRAPHY;  Surgeon: [name], MD;  Location: [place] CV LAB;  Service: Cardiovascular;  Laterality: Bilateral;   Patient Active Problem List   Diagnosis Date Noted   History of total knee arthroplasty, left 08/04/2022   Hypertrophic cardiomyopathy ([place]) 07/10/2022   Insomnia secondary to chronic pain 12/27/2020   Insomnia due to other mental disorder 12/27/2020   Snoring 12/27/2020   Inadequate sleep hygiene 12/27/2020   S/P total right hip arthroplasty 12/18/2020   Arthritis of right hip 12/17/2020   Encounter for preoperative assessment 12/10/2020   Infection of nail bed of finger of right hand 09/15/2020   Pain in right finger(s) 08/31/2020   Ganglion of right wrist 08/31/2020   COPD mixed type ([place]) 12/26/2018   Healthcare maintenance 12/26/2018   History of fall 11/05/2018   Weight loss 11/05/2018   Weakness 11/05/2018   Gangrene of finger ([place]) 10/09/2018   Raynaud's phenomenon with gangrene ([place]) 10/09/2018   LVH (left ventricular hypertrophy) 10/09/2018   Vasospasm ([place]) 09/06/2018   Hypotension 08/08/2018   Leucocytosis 08/08/2018   Elevated troponin I level 08/08/2018   Nausea and vomiting 08/06/2018   Pneumonia 07/12/2018   COPD exacerbation ([place]) 07/11/2018   Primary osteoarthritis of both feet 12/21/2017   DDD (degenerative disc disease), lumbar 12/21/2017   Primary osteoarthritis of both knees 12/21/2017   History of bilateral carpal tunnel release 12/21/2017   DDD (degenerative disc disease), cervical 11/15/2017   Former smoker 11/15/2017   Bronchiectasis without complication ([place]) 70/35/0093   Impingement syndrome of right shoulder 07/25/2017   Hx of fusion of cervical spine 07/25/2017   Cervical spinal stenosis 09/27/2016   Polypharmacy 06/23/2015   Hyperlipidemia 04/19/2015   Visit for preventive  health examination 01/01/2015   Primary osteoarthritis involving multiple joints 01/01/2015   Bipolar affective disorder, currently depressed, moderate ([place])    Bipolar I disorder with mania ([place]) 08/17/2014   Hypertension 10/21/2013   Dizziness 03/25/2013   Medication withdrawal ([place]) 03/05/2013   Diabetes mellitus with renal manifestations, controlled ([place]) 11/10/2012   Alopecia areata 02/06/2012   Obesity (BMI 30-39.9) 02/06/2012   Renal insufficiency 07/16/2011   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    Exertional dyspnea 02/07/2011   Dyspnea on exertion 01/19/2011   DM (diabetes mellitus), type 2 ([place]) 04/22/2010   Carpal tunnel syndrome 02/02/2010   CONSTIPATION 02/02/2010   Primary osteoarthritis of both hands 02/02/2010   CATARACTS 04/13/2009   PAIN IN JOINT, ANKLE AND FOOT 09/16/2008  Eosinophilia 07/21/2008   AFFECTIVE DISORDER 04/21/2008   BACK PAIN, CHRONIC 02/12/2008   LEG PAIN 02/12/2008   ABNORMAL INVOLUNTARY MOVEMENTS 12/04/2007   POSTURAL LIGHTHEADEDNESS 11/04/2007   COLONIC POLYPS, HX OF 11/04/2007   Essential hypertension 06/17/2007   HYPERLIPIDEMIA 04/08/2007   GERD 04/08/2007   LOW BACK PAIN SYNDROME 04/08/2007   CHEST PAIN, RECURRENT 04/08/2007     THERAPY DIAG:  Acute pain of left knee  Stiffness of left knee, not elsewhere classified  Difficulty in walking, not elsewhere classified  Localized edema  Muscle weakness (generalized)  PCP: [name], MD   REFERRING PROVIDER: [name] MD   REFERRING DIAG: M17.12 (ICD-10-CM) - Unilateral primary osteoarthritis, left knee   EVAL THERAPY DIAG:  Acute pain of left knee   Stiffness of left knee, not elsewhere classified   Difficulty in walking, not elsewhere classified   Localized edema   Muscle weakness (generalized)   Rationale for Evaluation and Treatment Rehabilitation   ONSET DATE: 07/21/22   SUBJECTIVE:    SUBJECTIVE STATEMENT:  When asked how everything is feeling pt  states "its not"; late at night and early in the morning it pulls back on its own and it stays and it hurts and sometimes it sticks, I can barely get to the refrigerator some times. I've called the doctor's office twice about this, not sure what's going on. Not sure when I see the doctor next. Usually when ice is on it it straightens all the way out but the past couple nights its not straightened out.    PERTINENT HISTORY: Lt TKA 07/21/22 PMH: COPD,lumbar DDD and fusion L4-5 2002,,cerv fusion 2017, Rt THA, DM   PAIN:  NPRS scale: 5/10 now, 10/10 when knee "draws up and sticks"  Pain location: left knee, incision is bothersome  Pain description: sharp pain when "it draws up and sticks", aching right now  Aggravating factors: nighttime, tightness Relieving factors: pain meds, ice, elevation   PRECAUTIONS: None   WEIGHT BEARING RESTRICTIONS No   FALLS:  Has patient fallen in last 6 months? No   LIVING ENVIRONMENT: Lives with: lives with their family Lives in: House/apartment Stairs: No Has following equipment at home: Gilford Rile - 4 wheeled, RW, st cane, grab bars in bathroom. Raised toilet seat,  Pt stating her shower chair doesn't  fit properly.    OCCUPATION: retired   PLOF: Independent   PATIENT GOALS walk without walker, stop hurting     OBJECTIVE:     PATIENT SURVEYS:  08/31/2022 FOTO 47 (was risk adjusted 39, Goal 55) 08/08/22: FOTO intake: 51%  predicted:  55%   COGNITION: 08/08/22 Overall cognitive status: WFL               SENSATION: 08/08/22: WFL   EDEMA:  08/08/22 Rt knee circumferential: 37 centimeters                 Left knee circumferential: 45 centimeters   MUSCLE LENGTH: Hamstrings: Right 50 deg; Left 68 deg     POSTURE: rounded shoulders and forward head   PALPATION: TTP: lateral joint line and around incision site, pt also    LOWER EXTREMITY STRENGTH:   MMT Right 08/08/22 Left 08/08/22  Hip flexion 5/5 4/5  Hip extension      Hip abduction       Hip adduction      Hip internal rotation      Hip external rotation      Knee flexion 5/5 3/5  Knee extension 5/5 3/5  Ankle dorsiflexion 5/5 5/5  Ankle plantarflexion 5/5 5/5  Ankle inversion      Ankle eversion       (Blank rows = not tested)   LOWER EXTREMITY ROM:   ROM A=active P=passive Right 08/08/22 supine Left 08/08/22 supine Left 08/09/22 Supine Left 08/16/22 Left 08/22/22 supine Left 08/29/22 supine Left 08/31/2022 Left 09/13/2022 Left 09/22/22  Hip flexion             Hip extension             Knee flexion A: 128   A: 70 P: 80   A: 77 P: 85 P: 95 A: 85 P: 94 A:90 Active supine 92 AROM in supine heel slide 95 PROM sitting 93, AROM 88  Knee extension A: 0   A: -8 P: -5 A: -2 edema limited A: -12 (seated LAQ) A:-10 P: -8 A: -8 Active supine -2 Seated AROM LAQ : -4  Active quad set supine: -2    (Blank rows = not tested)      GAIT: 09/13/2022 : independent ambulation into clinic c decreased stance, lacking full TKE in stance on Lt leg  08/08/2022 Distance walked: 50 feet  Assistive device utilized: Environmental consultant - 4 wheeled Level of assistance: Modified independence Comments: antalgic gait pattern     TODAY'S TREATMENT:   09/22/22  TherEx  Scifit bike seat 8 (able to progress to seat 7 after 3 minutes 30 seconds) x6 minutes full rotations for ROM Step ups 6 inch box x10 L LE BUE support  Mini squats in // bars x12 cues to reduce UE use  Tandem stance 2x30 seconds blue foam pad   Manual  Retrograde massage for edema control LE elevated  Tennis ball massage L LE in elevation  Knee flexion overpressure sitting on side of mat table x10    09/13/2022 Therex: Nu Step Seat far back for extension and up close for flexion 4 minutes each Level 6 AAROM Left knee flexion (Right knee pushes Left into flexion) 10X 10 seconds Quadriceps sets with Left heel prop 5 X 5 seconds Double leg press 81 lbs 15X slow eccentrics Single leg press , performed  bilaterally 25# 15X  Step up 4 inch Lt leg loading with single hand assist x 15 Incline gastroc stretch bilateral 30 sec x 3 standing Supine AROM heel slide 5 sec hold x 5  Manual Seated Lt knee flexion c distraction/IR mobilization c movement.  Mid range g2-g3 IR/ER proximal tibia mobs  Vasopneumatic Lt knee 10 minutes 34* Medium Pressure  08/31/2022 Nu Step Seat far back for extension and up close for flexion 4 minutes each Level 6 [place] knee flexion 1 minute AAROM Left knee flexion (Right knee pushes Left into flexion) 10X 10 seconds Quadriceps sets with Left heel prop 10X 5 seconds  Functional Activities for stairs and sit to stand: Double leg press 75# 15X slow eccentrics (push into full extension and stretch into flexion) Single leg press left only 25# 10X (push into full extension and stretch into flexion) Sit to stand slow eccentrics 5X  Vasopneumatic L knee 10 minutes 34* Medium Pressure   08/29/22 Therex NuStep L6 x 8 min Calf stretch on slant board x 2 holding 30 sec Standing mini squats x 10 c UE support Leg Press bil 62# 2x10; LLE only 25# 2x10 Lt LAQ 3# 2x10; 5 sec hold Seated SLR: x 10 c 3#  Neuromuscular Re-edu: Standing on level surface: feet together, staggered  stance each x 20 seconds c no UE support Standing on Airex: feet apart, feet together x 20 sec each with SBA Manual PROM Lt knee flexion in sitting to tolerance PROM knee extension in supine with overpressure Modalities:  Vasopneumatic 34 degrees x 10 minutes Left knee, moderate compression     PATIENT EDUCATION:  Education details: HEP, POC Person educated: Patient Education method: Consulting civil engineer, Demonstration, Verbal cues, and Handouts Education comprehension: verbalized understanding, returned demonstration, and verbal cues required       HOME EXERCISE PROGRAM: Access Code: [email or web address] URL: [email or web address] Date: 08/08/2022 Prepared by: Kearney Hard   Exercises -  Supine Quad Set  - 3-4 x daily - 7 x weekly - 2 sets - 10 reps - Seated Long Arc Quad  - 3-4 x daily - 7 x weekly - 2 sets - 10 reps - 3-5 seconds hold - Sit to Stand with Counter Support  - 3-4 x daily - 7 x weekly - 2 sets - 10 reps - Seated Hamstring Stretch  - 3-4 x daily - 7 x weekly - 3 reps - 30 seconds hold - Supine Heel Slide with Strap  - 3-4 x daily - 7 x weekly - 10 reps - 5-10 seconds hold   ASSESSMENT:   CLINICAL IMPRESSION:  Shundra arrives doing OK, still tells me that she is having issues with her knee "drawing up and locking and sticking" at night. Continued working on ROM with focus on flexion today, responded well to manual techniques performed this session as well. ROM seems to be making slow but steady progress. Will continue to progress as able and tolerated.    OBJECTIVE IMPAIRMENTS Abnormal gait, decreased balance, decreased mobility, difficulty walking, decreased ROM, decreased strength, increased edema, and pain.    ACTIVITY LIMITATIONS bending, sitting, standing, sleeping, stairs, transfers, bed mobility, toileting, and dressing   PARTICIPATION LIMITATIONS: cleaning, laundry, shopping, and community activity   PERSONAL FACTORS 3+ comorbidities: see above  are also affecting patient's functional outcome.    REHAB POTENTIAL: Good   CLINICAL DECISION MAKING: Stable/uncomplicated   EVALUATION COMPLEXITY: Low     GOALS: Goals reviewed with patient? Yes   Short term PT Goals (target date for Short term goals are 3 weeks 09/01/22) Patient will demonstrate independent use of home exercise program to maintain progress from in clinic treatments. Goal status: MET 08/29/22   Pt will be able to improve her left knee flexion to >/= 90 degrees actively Goal Status: MET 08/29/22   Long term PT goals (target dates for all long term goals are 12 weeks  11/03/22 )   1. Patient will demonstrate/report pain at worst less than or equal to 2/10 to facilitate minimal limitation  in daily activity secondary to pain symptoms. Goal status: On Going 08/31/2022   2. Patient will demonstrate independent use of home exercise program to facilitate ability to maintain/progress functional gains from skilled physical therapy services. Goal status: On Going 08/31/2022   3. Patient will demonstrate FOTO outcome > or = 55 % to indicate reduced disability due to condition. Goal status: On Going 08/31/2022   4.  Patient will demonstrate left LE MMT 5/5 throughout to faciltiate usual transfers, stairs, squatting at [facility] for daily life.    Goal status: On Going 08/31/2022   5.  Pt will improve her 5 time sit to stand to </= 15 seconds c/s UE support.    Goal status: On Going 08/31/2022   6.  Pt will be able to navigate  curb step with/without assistive device safely.   Goal status: On Going (cane) 08/31/2022    7. Pt will improve her left knee flexion to >/= 115 degrees for improved functional mobility and safely.                        A. Goal Status: On Going 08/31/2022       PLAN:   PT FREQUENCY: 2x-3/week   PT DURATION: 12 weeks   PLANNED INTERVENTIONS: Therapeutic exercises, Therapeutic activity, Neuro Muscular re-education, Balance training, Gait training, Patient/Family education, Joint mobilization, Stair training, DME instructions, Dry Needling, Electrical stimulation, Traction, Cryotherapy, Moist heat, Taping, Ultrasound, Ionotophoresis 3m/ml Dexamethasone, and Manual therapy.  All included unless contraindicated     PLAN FOR NEXT SESSION:  Priority of manual and therex for mobility improvements in flexion. Quad strengthening.    KAnn LionsPT DPT PN2  09/22/2022, 3:13 PM

## 2022-09-25 ENCOUNTER — Telehealth: Payer: Self-pay | Admitting: *Deleted

## 2022-09-25 MED ORDER — METHOCARBAMOL 500 MG PO TABS: 500.0000 mg | ORAL_TABLET | Freq: Three times a day (TID) | ORAL | 0 refills | Status: DC | PRN

## 2022-09-25 NOTE — Telephone Encounter (Signed)
Patient left VM requesting refill of muscle relaxer if possible. Thanks.

## 2022-09-25 NOTE — Telephone Encounter (Signed)
FYI  Just wanted you to be aware. I called patient to advise medication sent to pharmacy. She states that her knee is giving her a fit, the pain wakes her at night and she feels like the knee is sticking. I made her an appointment for Wednesday morning with Dr. Ophelia Charter for recheck.

## 2022-09-26 ENCOUNTER — Encounter: Payer: Self-pay | Admitting: Orthopaedic Surgery

## 2022-09-26 ENCOUNTER — Ambulatory Visit (INDEPENDENT_AMBULATORY_CARE_PROVIDER_SITE_OTHER): Payer: Medicare HMO | Admitting: Physical Therapy

## 2022-09-26 ENCOUNTER — Ambulatory Visit: Payer: Self-pay

## 2022-09-26 ENCOUNTER — Encounter: Payer: Self-pay | Admitting: Physical Therapy

## 2022-09-26 ENCOUNTER — Encounter: Payer: Medicare HMO | Admitting: Internal Medicine

## 2022-09-26 ENCOUNTER — Ambulatory Visit (INDEPENDENT_AMBULATORY_CARE_PROVIDER_SITE_OTHER): Payer: Medicare HMO | Admitting: Orthopaedic Surgery

## 2022-09-26 VITALS — Ht 62.0 in | Wt 157.0 lb

## 2022-09-26 DIAGNOSIS — M25662 Stiffness of left knee, not elsewhere classified: Secondary | ICD-10-CM | POA: Diagnosis not present

## 2022-09-26 DIAGNOSIS — R262 Difficulty in walking, not elsewhere classified: Secondary | ICD-10-CM | POA: Diagnosis not present

## 2022-09-26 DIAGNOSIS — R600 Localized edema: Secondary | ICD-10-CM

## 2022-09-26 DIAGNOSIS — M659 Synovitis and tenosynovitis, unspecified: Secondary | ICD-10-CM

## 2022-09-26 DIAGNOSIS — M6281 Muscle weakness (generalized): Secondary | ICD-10-CM | POA: Diagnosis not present

## 2022-09-26 DIAGNOSIS — M25562 Pain in left knee: Secondary | ICD-10-CM | POA: Diagnosis not present

## 2022-09-26 DIAGNOSIS — Z96652 Presence of left artificial knee joint: Secondary | ICD-10-CM

## 2022-09-26 MED ORDER — DICLOFENAC SODIUM 1 % EX GEL: 4.0000 g | Freq: Four times a day (QID) | CUTANEOUS | Status: AC

## 2022-09-26 NOTE — Progress Notes (Unsigned)
Post-Op Visit Note   Patient: Desiree [name]           Date of Birth: 09-06-1951           MRN: [id number] Visit Date: 09/26/2022 PCP: Madelin Headings, MD   Assessment & Plan:  Chief Complaint:  Chief Complaint  Patient presents with   Left Knee - Pain   Visit Diagnoses:  1. History of total knee arthroplasty, left   2. Synovitis of left knee     Plan: ***  Follow-Up Instructions: No follow-ups on file.   Orders:  Orders Placed This Encounter  Procedures   XR Knee 1-2 Views Left   Meds ordered this encounter  Medications   diclofenac Sodium (VOLTAREN) 1 % topical gel 4 g    Imaging: No results found.  PMFS History: Patient Active Problem List   Diagnosis Date Noted   History of total knee arthroplasty, left 08/04/2022   Hypertrophic cardiomyopathy (HCC) 07/10/2022   Insomnia secondary to chronic pain 12/27/2020   Insomnia due to other mental disorder 12/27/2020   Snoring 12/27/2020   Inadequate sleep hygiene 12/27/2020   S/P total right hip arthroplasty 12/18/2020   Arthritis of right hip 12/17/2020   Encounter for preoperative assessment 12/10/2020   Infection of nail bed of finger of right hand 09/15/2020   Pain in right finger(s) 08/31/2020   Ganglion of right wrist 08/31/2020   COPD mixed type (HCC) 12/26/2018   Healthcare maintenance 12/26/2018   History of fall 11/05/2018   Weight loss 11/05/2018   Weakness 11/05/2018   Gangrene of finger (HCC) 10/09/2018   Raynaud's phenomenon with gangrene (HCC) 10/09/2018   LVH (left ventricular hypertrophy) 10/09/2018   Vasospasm (HCC) 09/06/2018   Hypotension 08/08/2018   Leucocytosis 08/08/2018   Elevated troponin I level 08/08/2018   Nausea and vomiting 08/06/2018   Pneumonia 07/12/2018   COPD exacerbation (HCC) 07/11/2018   Primary osteoarthritis of both feet 12/21/2017   DDD (degenerative disc disease), lumbar 12/21/2017   Primary osteoarthritis of both knees 12/21/2017   History of bilateral  carpal tunnel release 12/21/2017   DDD (degenerative disc disease), cervical 11/15/2017   Former smoker 11/15/2017   Bronchiectasis without complication (HCC) 10/25/2017   Impingement syndrome of right shoulder 07/25/2017   Hx of fusion of cervical spine 07/25/2017   Cervical spinal stenosis 09/27/2016   Polypharmacy 06/23/2015   Hyperlipidemia 04/19/2015   Visit for preventive health examination 01/01/2015   Primary osteoarthritis involving multiple joints 01/01/2015   Bipolar affective disorder, currently depressed, moderate (HCC)    Bipolar I disorder with mania (HCC) 08/17/2014   Hypertension 10/21/2013   Dizziness 03/25/2013   Medication withdrawal (HCC) 03/05/2013   Diabetes mellitus with renal manifestations, controlled (HCC) 11/10/2012   Alopecia areata 02/06/2012   Obesity (BMI 30-39.9) 02/06/2012   Renal insufficiency 07/16/2011   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    Exertional dyspnea 02/07/2011   Dyspnea on exertion 01/19/2011   DM (diabetes mellitus), type 2 (HCC) 04/22/2010   Carpal tunnel syndrome 02/02/2010   CONSTIPATION 02/02/2010   Primary osteoarthritis of both hands 02/02/2010   CATARACTS 04/13/2009   PAIN IN JOINT, ANKLE AND FOOT 09/16/2008   Eosinophilia 07/21/2008   AFFECTIVE DISORDER 04/21/2008   BACK PAIN, CHRONIC 02/12/2008   LEG PAIN 02/12/2008   ABNORMAL INVOLUNTARY MOVEMENTS 12/04/2007   POSTURAL LIGHTHEADEDNESS 11/04/2007   COLONIC POLYPS, HX OF 11/04/2007   Essential hypertension 06/17/2007   HYPERLIPIDEMIA 04/08/2007   GERD 04/08/2007   LOW  BACK PAIN SYNDROME 04/08/2007   CHEST PAIN, RECURRENT 04/08/2007   Past Medical History:  Diagnosis Date   Acute gastritis without bleeding 08/08/2018   Anemia    Anxiety    Asthma    Bipolar disorder (HCC)    CANDIDIASIS, ESOPHAGEAL 07/28/2009   Qualifier: Diagnosis of  By: Fabian Sharp MD, Neta Mends    Chronic kidney disease    CKD III   Complication of anesthesia    was told she stopped breathing for  one of her finger surgeries   COPD (chronic obstructive pulmonary disease) (HCC)    Depression    Diabetes mellitus without complication (HCC)    no meds   Dyspnea    At rest,and with activity   FH: colonic polyps    Fractured elbow    right    GERD (gastroesophageal reflux disease)    Headache    migraines   HH (hiatus hernia)    History of carpal tunnel syndrome    History of chest pain    History of transfusion of packed red blood cells    Hyperlipidemia    Hypertension    Mitral valve prolapse    Neuroleptic-induced tardive dyskinesia    Osteoarthritis of more than one site    Pneumonia    Seasonal allergies    Stroke [facility])     Family History  Problem Relation Age of Onset   Diabetes Mother    Hypertension Mother    Heart attack Mother    Heart attack Father    Heart disease Father    Throat cancer Brother    Diabetes type II Brother    Heart disease Brother    Lung cancer Brother    Lung cancer Paternal Uncle    Lung cancer Daughter    Breast cancer Cousin     Past Surgical History:  Procedure Laterality Date   AMPUTATION Left 10/14/2018   Procedure: AMPUTATION LEFT LONG FINGER TIP;  Surgeon: Knute Neu, MD;  Location: [facility] OR;  Service: Plastics;  Laterality: Left;   ANTERIOR CERVICAL DECOMP/DISCECTOMY FUSION  09/27/2016   C5-6 anterior cervical discectomy and fusion, allograft and plate/notes 57/0/1779   ANTERIOR CERVICAL DECOMP/DISCECTOMY FUSION N/A 09/27/2016   Procedure: C5-6 Anterior Cervical Discectomy and Fusion, Allograft and Plate;  Surgeon: Eldred Manges, MD;  Location: [facility] OR;  Service: Orthopedics;  Laterality: N/A;   Back Fusion  2002   BIOPSY  07/19/2018   Procedure: BIOPSY;  Surgeon: Jeani Hawking, MD;  Location: [facility] ENDOSCOPY;  Service: Endoscopy;;   BIOPSY  02/13/2019   Procedure: BIOPSY;  Surgeon: Jeani Hawking, MD;  Location: [facility] ENDOSCOPY;  Service: Endoscopy;;   CARPAL TUNNEL RELEASE  Davanna He   left   COLONOSCOPY N/A 01/05/2014   Procedure:  COLONOSCOPY;  Surgeon: Charna Elizabeth, MD;  Location: [facility] ENDOSCOPY;  Service: Endoscopy;  Laterality: N/A;   ELBOW SURGERY     age 66   ENTEROSCOPY N/A 02/13/2019   Procedure: ENTEROSCOPY;  Surgeon: Jeani Hawking, MD;  Location: [facility] ENDOSCOPY;  Service: Endoscopy;  Laterality: N/A;   ESOPHAGOGASTRODUODENOSCOPY (EGD) WITH PROPOFOL N/A 07/19/2018   Procedure: ESOPHAGOGASTRODUODENOSCOPY (EGD) WITH PROPOFOL;  Surgeon: Jeani Hawking, MD;  Location: [facility] ENDOSCOPY;  Service: Endoscopy;  Laterality: N/A;   EXTERNAL EAR SURGERY Left    EYE SURGERY     "removed white dots under eyelid"   FINGER SURGERY Left    INJECTION KNEE Right 07/21/2022   Procedure: INTRA-ARTICULAR INJECTION UNDER ANESTHESIA;  Surgeon: Eldred Manges, MD;  Location:  [facility] OR;  Service: Orthopedics;  Laterality: Right;   Juvara osteomy     KNEE SURGERY     NOSE SURGERY     Rt. toe bunion     skin, shave biopsy  05/03/2016   Left occipital scalp, top of scalp   TONSILLECTOMY     TOTAL HIP ARTHROPLASTY Right 12/17/2020   TOTAL HIP ARTHROPLASTY Right 12/17/2020   Procedure: TOTAL HIP ARTHROPLASTY-DIRECT ANTERIOR;  Surgeon: Eldred Manges, MD;  Location: [facility] OR;  Service: Orthopedics;  Laterality: Right;  needs RNFA   TOTAL KNEE ARTHROPLASTY Left 07/21/2022   Procedure: LEFT TOTAL KNEE ARTHROPLASTY;  Surgeon: Eldred Manges, MD;  Location: [facility] OR;  Service: Orthopedics;  Laterality: Left;   UPPER EXTREMITY ANGIOGRAPHY Bilateral 10/11/2018   Procedure: UPPER EXTREMITY ANGIOGRAPHY;  Surgeon: Sherren Kerns, MD;  Location: [facility] INVASIVE CV LAB;  Service: Cardiovascular;  Laterality: Bilateral;   Social History   Occupational History   Not on file  Tobacco Use   Smoking status: Former    Packs/day: 2.00    Years: 30.00    Total pack years: 60.00    Types: Cigarettes    Quit date: 10/23/2001    Years since quitting: 20.9    Passive exposure: Past   Smokeless tobacco: Never  Vaping Use   Vaping Use: Never used  Substance and Sexual Activity    Alcohol use: No   Drug use: No   Sexual activity: Not on file

## 2022-09-26 NOTE — Therapy (Signed)
OUTPATIENT PHYSICAL THERAPY TREATMENT NOTE   Patient Name: Desiree [name] MRN: [id number] DOB:1950-12-14, 71 y.o., female Today's Date: 09/26/2022  END OF SESSION:   PT End of Session - 09/26/22 1511     Visit Number 9    Number of Visits 24    Date for PT Re-Evaluation 11/03/22    Authorization Type humana requested 12 visit    Authorization Time Period 10/17-1/12    Authorization - Visit Number 9    Authorization - Number of Visits 12    Progress Note Due on Visit 10    PT Start Time 1430    PT Stop Time 1512    PT Time Calculation (min) 42 min    Activity Tolerance Patient tolerated treatment well    Behavior During Therapy WFL for tasks assessed/performed                 Past Medical History:  Diagnosis Date   Acute gastritis without bleeding 08/08/2018   Anemia    Anxiety    Asthma    Bipolar disorder ([place])    CANDIDIASIS, ESOPHAGEAL 07/28/2009   Qualifier: Diagnosis of  By: [name] MD, [name]    Chronic kidney disease    CKD III   Complication of anesthesia    was told she stopped breathing for one of her finger surgeries   COPD (chronic obstructive pulmonary disease) ([place])    Depression    Diabetes mellitus without complication (HCC)    no meds   Dyspnea    At rest,and with activity   FH: colonic polyps    Fractured elbow    right    GERD (gastroesophageal reflux disease)    Headache    migraines   HH (hiatus hernia)    History of carpal tunnel syndrome    History of chest pain    History of transfusion of packed red blood cells    Hyperlipidemia    Hypertension    Mitral valve prolapse    Neuroleptic-induced tardive dyskinesia    Osteoarthritis of more than one site    Pneumonia    Seasonal allergies    Stroke [facility])    Past Surgical History:  Procedure Laterality Date   AMPUTATION Left 10/14/2018   Procedure: AMPUTATION LEFT LONG FINGER TIP;  Surgeon: Dayna Barker, MD;  Location: [place];  Service: Plastics;  Laterality: Left;    ANTERIOR CERVICAL DECOMP/DISCECTOMY FUSION  09/27/2016   C5-6 anterior cervical discectomy and fusion, allograft and plate/notes 09/27/2016   ANTERIOR CERVICAL DECOMP/DISCECTOMY FUSION N/A 09/27/2016   Procedure: C5-6 Anterior Cervical Discectomy and Fusion, Allograft and Plate;  Surgeon: [name], MD;  Location: [place];  Service: Orthopedics;  Laterality: N/A;   Back Fusion  2002   BIOPSY  07/19/2018   Procedure: BIOPSY;  Surgeon: [name], MD;  Location: [facility] ENDOSCOPY;  Service: Endoscopy;;   BIOPSY  02/13/2019   Procedure: BIOPSY;  Surgeon: [name], MD;  Location: [facility] ENDOSCOPY;  Service: Endoscopy;;   CARPAL TUNNEL RELEASE  yates   left   COLONOSCOPY N/A 01/05/2014   Procedure: COLONOSCOPY;  Surgeon: [name], MD;  Location: [facility] ENDOSCOPY;  Service: Endoscopy;  Laterality: N/A;   ELBOW SURGERY     age 84   ENTEROSCOPY N/A 02/13/2019   Procedure: ENTEROSCOPY;  Surgeon: [name], MD;  Location: [facility] ENDOSCOPY;  Service: Endoscopy;  Laterality: N/A;   ESOPHAGOGASTRODUODENOSCOPY (EGD) WITH PROPOFOL N/A 07/19/2018   Procedure: ESOPHAGOGASTRODUODENOSCOPY (EGD) WITH PROPOFOL;  Surgeon: Benson Norway,  Saralyn Pilar, MD;  Location: [place];  Service: Endoscopy;  Laterality: N/A;   EXTERNAL EAR SURGERY Left    EYE SURGERY     "removed white dots under eyelid"   FINGER SURGERY Left    INJECTION KNEE Right 07/21/2022   Procedure: INTRA-ARTICULAR INJECTION UNDER ANESTHESIA;  Surgeon: [name], MD;  Location: [address];  Service: Orthopedics;  Laterality: Right;   Juvara osteomy     KNEE SURGERY     NOSE SURGERY     Rt. toe bunion     skin, shave biopsy  05/03/2016   Left occipital scalp, top of scalp   TONSILLECTOMY     TOTAL HIP ARTHROPLASTY Right 12/17/2020   TOTAL HIP ARTHROPLASTY Right 12/17/2020   Procedure: TOTAL HIP ARTHROPLASTY-DIRECT ANTERIOR;  Surgeon: [name], MD;  Location: [place];  Service: Orthopedics;  Laterality: Right;  needs RNFA   TOTAL KNEE ARTHROPLASTY Left 07/21/2022    Procedure: LEFT TOTAL KNEE ARTHROPLASTY;  Surgeon: [name], MD;  Location: [place];  Service: Orthopedics;  Laterality: Left;   UPPER EXTREMITY ANGIOGRAPHY Bilateral 10/11/2018   Procedure: UPPER EXTREMITY ANGIOGRAPHY;  Surgeon: [name], MD;  Location: [place] [facility];  Service: Cardiovascular;  Laterality: Bilateral;   Patient Active Problem List   Diagnosis Date Noted   History of total knee arthroplasty, left 08/04/2022   Hypertrophic cardiomyopathy ([place]) 07/10/2022   Insomnia secondary to chronic pain 12/27/2020   Insomnia due to other mental disorder 12/27/2020   Snoring 12/27/2020   Inadequate sleep hygiene 12/27/2020   S/P total right hip arthroplasty 12/18/2020   Arthritis of right hip 12/17/2020   Encounter for preoperative assessment 12/10/2020   Infection of nail bed of finger of right hand 09/15/2020   Pain in right finger(s) 08/31/2020   Ganglion of right wrist 08/31/2020   COPD mixed type ([place]) 12/26/2018   Healthcare maintenance 12/26/2018   History of fall 11/05/2018   Weight loss 11/05/2018   Weakness 11/05/2018   Gangrene of finger ([place]) 10/09/2018   Raynaud's phenomenon with gangrene ([place]) 10/09/2018   LVH (left ventricular hypertrophy) 10/09/2018   Vasospasm ([place]) 09/06/2018   Hypotension 08/08/2018   Leucocytosis 08/08/2018   Elevated troponin I level 08/08/2018   Nausea and vomiting 08/06/2018   Pneumonia 07/12/2018   COPD exacerbation () 07/11/2018   Primary osteoarthritis of both feet 12/21/2017   DDD (degenerative disc disease), lumbar 12/21/2017   Primary osteoarthritis of both knees 12/21/2017   History of bilateral carpal tunnel release 12/21/2017   DDD (degenerative disc disease), cervical 11/15/2017   Former smoker 11/15/2017   Bronchiectasis without complication ([place]) 38/93/7342   Impingement syndrome of right shoulder 07/25/2017   Hx of fusion of cervical spine 07/25/2017   Cervical spinal stenosis 09/27/2016    Polypharmacy 06/23/2015   Hyperlipidemia 04/19/2015   Visit for preventive health examination 01/01/2015   Primary osteoarthritis involving multiple joints 01/01/2015   Bipolar affective disorder, currently depressed, moderate ([place])    Bipolar I disorder with mania ([place]) 08/17/2014   Hypertension 10/21/2013   Dizziness 03/25/2013   Medication withdrawal ([place]) 03/05/2013   Diabetes mellitus with renal manifestations, controlled ([place]) 11/10/2012   Alopecia areata 02/06/2012   Obesity (BMI 30-39.9) 02/06/2012   Renal insufficiency 07/16/2011   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    Exertional dyspnea 02/07/2011   Dyspnea on exertion 01/19/2011   DM (diabetes mellitus), type 2 ([place]) 04/22/2010   Carpal tunnel syndrome 02/02/2010   CONSTIPATION 02/02/2010  Primary osteoarthritis of both hands 02/02/2010   CATARACTS 04/13/2009   PAIN IN JOINT, ANKLE AND FOOT 09/16/2008   Eosinophilia 07/21/2008   AFFECTIVE DISORDER 04/21/2008   BACK PAIN, CHRONIC 02/12/2008   LEG PAIN 02/12/2008   ABNORMAL INVOLUNTARY MOVEMENTS 12/04/2007   POSTURAL LIGHTHEADEDNESS 11/04/2007   COLONIC POLYPS, HX OF 11/04/2007   Essential hypertension 06/17/2007   HYPERLIPIDEMIA 04/08/2007   GERD 04/08/2007   LOW BACK PAIN SYNDROME 04/08/2007   CHEST PAIN, RECURRENT 04/08/2007     THERAPY DIAG:  Acute pain of left knee  Stiffness of left knee, not elsewhere classified  Difficulty in walking, not elsewhere classified  Localized edema  Muscle weakness (generalized)  PCP: [name], MD   REFERRING PROVIDER: [name] MD   REFERRING DIAG: M17.12 (ICD-10-CM) - Unilateral primary osteoarthritis, left knee   EVAL THERAPY DIAG:  Acute pain of left knee   Stiffness of left knee, not elsewhere classified   Difficulty in walking, not elsewhere classified   Localized edema   Muscle weakness (generalized)   Rationale for Evaluation and Treatment Rehabilitation   ONSET DATE: 07/21/22    SUBJECTIVE:    SUBJECTIVE STATEMENT:  When asked how everything is feeling pt states "its not"; late at night and early in the morning it pulls back on its own and it stays and it hurts and sometimes it sticks, I can barely get to the refrigerator some times. I've called the doctor's office twice about this, not sure what's going on. Not sure when I see the doctor next. Usually when ice is on it it straightens all the way out but the past couple nights its not straightened out.    PERTINENT HISTORY: Lt TKA 07/21/22 PMH: COPD,lumbar DDD and fusion L4-5 2002,,cerv fusion 2017, Rt THA, DM   PAIN:  NPRS scale: 5/10 now, 10/10 when knee "draws up and sticks"  Pain location: left knee, incision is bothersome  Pain description: sharp pain when "it draws up and sticks", aching right now  Aggravating factors: nighttime, tightness Relieving factors: pain meds, ice, elevation   PRECAUTIONS: None   WEIGHT BEARING RESTRICTIONS No   FALLS:  Has patient fallen in last 6 months? No   LIVING ENVIRONMENT: Lives with: lives with their family Lives in: House/apartment Stairs: No Has following equipment at home: Gilford Rile - 4 wheeled, RW, st cane, grab bars in bathroom. Raised toilet seat,  Pt stating her shower chair doesn't  fit properly.    OCCUPATION: retired   PLOF: Independent   PATIENT GOALS walk without walker, stop hurting     OBJECTIVE:     PATIENT SURVEYS:  08/31/2022 FOTO 47 (was risk adjusted 39, Goal 55) 08/08/22: FOTO intake: 51%  predicted:  55%   COGNITION: 08/08/22 Overall cognitive status: WFL               SENSATION: 08/08/22: WFL   EDEMA:  08/08/22 Rt knee circumferential: 37 centimeters                 Left knee circumferential: 45 centimeters   MUSCLE LENGTH: Hamstrings: Right 50 deg; Left 68 deg     POSTURE: rounded shoulders and forward head   PALPATION: TTP: lateral joint line and around incision site, pt also    LOWER EXTREMITY STRENGTH:   MMT  Right 08/08/22 Left 08/08/22  Hip flexion 5/5 4/5  Hip extension      Hip abduction      Hip adduction  Hip internal rotation      Hip external rotation      Knee flexion 5/5 3/5  Knee extension 5/5 3/5  Ankle dorsiflexion 5/5 5/5  Ankle plantarflexion 5/5 5/5  Ankle inversion      Ankle eversion       (Blank rows = not tested)   LOWER EXTREMITY ROM:   ROM A=active P=passive Right 08/08/22 supine Left 08/08/22 supine Left 08/09/22 Supine Left 08/16/22 Left 08/22/22 supine Left 08/29/22 supine Left 08/31/2022 Left 09/13/2022 Left 09/22/22 Left 09/26/22  Hip flexion              Hip extension              Knee flexion A: 128   A: 70 P: 80   A: 77 P: 85 P: 95 A: 85 P: 94 A:90 Active supine 92 AROM in supine heel slide 95 PROM sitting 93, AROM 88 sitting  P: 100 A: 90  Knee extension A: 0   A: -8 P: -5 A: -2 edema limited A: -12 (seated LAQ) A:-10 P: -8 A: -8 Active supine -2 Seated AROM LAQ : -4  Active quad set supine: -2     (Blank rows = not tested)      GAIT: 09/13/2022 : independent ambulation into clinic c decreased stance, lacking full TKE in stance on Lt leg  08/08/2022 Distance walked: 50 feet  Assistive device utilized: Environmental consultant - 4 wheeled Level of assistance: Modified independence Comments: antalgic gait pattern     TODAY'S TREATMENT: 09/26/22:  TherEx:  Scifit bike: seat at [employer] 2 x 7 minutes Step ups on 6 inch step x 10 each LE leading Lateral step ups on 6 inch step x 10 leading each LE TRX squats x 10 Leg Press: bil 81# 3 x 10 Leg Press: left LE: 43# 2 x 10 Manual:  Seated Lt knee flexion c distraction/IR mobilization c movement.      09/22/22 TherEx  Scifit bike seat 8 (able to progress to seat 7 after 3 minutes 30 seconds) x6 minutes full rotations for ROM Step ups 6 inch box x10 L LE BUE support  Mini squats in // bars x12 cues to reduce UE use  Tandem stance 2x30 seconds blue foam pad   Manual  Retrograde  massage for edema control LE elevated  Tennis ball massage L LE in elevation  Knee flexion overpressure sitting on side of mat table x10    09/13/2022 Therex: Nu Step Seat far back for extension and up close for flexion 4 minutes each Level 6 AAROM Left knee flexion (Right knee pushes Left into flexion) 10X 10 seconds Quadriceps sets with Left heel prop 5 X 5 seconds Double leg press 81 lbs 15X slow eccentrics Single leg press , performed bilaterally 25# 15X  Step up 4 inch Lt leg loading with single hand assist x 15 Incline gastroc stretch bilateral 30 sec x 3 standing Supine AROM heel slide 5 sec hold x 5  Manual Seated Lt knee flexion c distraction/IR mobilization c movement.  Mid range g2-g3 IR/ER proximal tibia mobs  Vasopneumatic Lt knee 10 minutes 34* Medium Pressure        PATIENT EDUCATION:  Education details: HEP, POC Person educated: Patient Education method: Explanation, Demonstration, Verbal cues, and Handouts Education comprehension: verbalized understanding, returned demonstration, and verbal cues required       HOME EXERCISE PROGRAM: Access Code: [email or web address] URL: [email or web address] Date: 08/08/2022 Prepared by: Kearney Hard   Exercises -  Supine Quad Set  - 3-4 x daily - 7 x weekly - 2 sets - 10 reps - Seated Long Arc Quad  - 3-4 x daily - 7 x weekly - 2 sets - 10 reps - 3-5 seconds hold - Sit to Stand with Counter Support  - 3-4 x daily - 7 x weekly - 2 sets - 10 reps - Seated Hamstring Stretch  - 3-4 x daily - 7 x weekly - 3 reps - 30 seconds hold - Supine Heel Slide with Strap  - 3-4 x daily - 7 x weekly - 10 reps - 5-10 seconds hold   ASSESSMENT:   CLINICAL IMPRESSION: Pt still reporting her left LE drawing up at night and her pain increasing to 10/10 at night. Pt has an appointment with Dr. Lorin Mercy following her PT session. Pt tolerating exercises well. Pt's current left knee flexion is 100 degrees passively and 90 degrees actively.  Continue skilled PT to maximize pt's function.    OBJECTIVE IMPAIRMENTS Abnormal gait, decreased balance, decreased mobility, difficulty walking, decreased ROM, decreased strength, increased edema, and pain.    ACTIVITY LIMITATIONS bending, sitting, standing, sleeping, stairs, transfers, bed mobility, toileting, and dressing   PARTICIPATION LIMITATIONS: cleaning, laundry, shopping, and community activity   PERSONAL FACTORS 3+ comorbidities: see above  are also affecting patient's functional outcome.    REHAB POTENTIAL: Good   CLINICAL DECISION MAKING: Stable/uncomplicated   EVALUATION COMPLEXITY: Low     GOALS: Goals reviewed with patient? Yes   Short term PT Goals (target date for Short term goals are 3 weeks 09/01/22) Patient will demonstrate independent use of home exercise program to maintain progress from in clinic treatments. Goal status: MET 08/29/22   Pt will be able to improve her left knee flexion to >/= 90 degrees actively Goal Status: MET 08/29/22   Long term PT goals (target dates for all long term goals are 12 weeks  11/03/22 )   1. Patient will demonstrate/report pain at worst less than or equal to 2/10 to facilitate minimal limitation in daily activity secondary to pain symptoms. Goal status: On Going 08/31/2022   2. Patient will demonstrate independent use of home exercise program to facilitate ability to maintain/progress functional gains from skilled physical therapy services. Goal status: On Going 08/31/2022   3. Patient will demonstrate FOTO outcome > or = 55 % to indicate reduced disability due to condition. Goal status: On Going 08/31/2022   4.  Patient will demonstrate left LE MMT 5/5 throughout to faciltiate usual transfers, stairs, squatting at [facility] for daily life.    Goal status: On Going 08/31/2022   5.  Pt will improve her 5 time sit to stand to </= 15 seconds c/s UE support.    Goal status: On Going 08/31/2022   6.  Pt will be able to navigate curb  step with/without assistive device safely.   Goal status: On Going (cane) 08/31/2022    7. Pt will improve her left knee flexion to >/= 115 degrees for improved functional mobility and safely.                        A. Goal Status: On Going 08/31/2022       PLAN:   PT FREQUENCY: 2x-3/week   PT DURATION: 12 weeks   PLANNED INTERVENTIONS: Therapeutic exercises, Therapeutic activity, Neuro Muscular re-education, Balance training, Gait training, Patient/Family education, Joint mobilization, Stair training, DME instructions, Dry Needling, Electrical stimulation, Traction, Cryotherapy, Moist heat,  Taping, Ultrasound, Ionotophoresis 16m/ml Dexamethasone, and Manual therapy.  All included unless contraindicated     PLAN FOR NEXT SESSION:  Priority of manual and therex for mobility improvements in flexion. Quad strengthening.   JKearney Hard PT, MPT 09/26/22 3:14 PM   09/26/2022, 3:14 PM

## 2022-09-27 ENCOUNTER — Encounter: Payer: Medicare HMO | Admitting: Orthopaedic Surgery

## 2022-09-27 NOTE — Therapy (Unsigned)
OUTPATIENT PHYSICAL THERAPY TREATMENT NOTE   Patient Name: Desiree [name] MRN: [id number] DOB:May 22, 1951, 71 y.o., female Today's Date: 09/28/2022  END OF SESSION:    Past Medical History:  Diagnosis Date   Acute gastritis without bleeding 08/08/2018   Anemia    Anxiety    Asthma    Bipolar disorder ([place])    CANDIDIASIS, ESOPHAGEAL 07/28/2009   Qualifier: Diagnosis of  By: [name] MD, [name]    Chronic kidney disease    CKD III   Complication of anesthesia    was told she stopped breathing for one of her finger surgeries   COPD (chronic obstructive pulmonary disease) ([place])    Depression    Diabetes mellitus without complication (HCC)    no meds   Dyspnea    At rest,and with activity   FH: colonic polyps    Fractured elbow    right    GERD (gastroesophageal reflux disease)    Headache    migraines   HH (hiatus hernia)    History of carpal tunnel syndrome    History of chest pain    History of transfusion of packed red blood cells    Hyperlipidemia    Hypertension    Mitral valve prolapse    Neuroleptic-induced tardive dyskinesia    Osteoarthritis of more than one site    Pneumonia    Seasonal allergies    Stroke [facility])    Past Surgical History:  Procedure Laterality Date   AMPUTATION Left 10/14/2018   Procedure: AMPUTATION LEFT LONG FINGER TIP;  Surgeon: Dayna Barker, MD;  Location: [place];  Service: Plastics;  Laterality: Left;   ANTERIOR CERVICAL DECOMP/DISCECTOMY FUSION  09/27/2016   C5-6 anterior cervical discectomy and fusion, allograft and plate/notes 09/27/2016   ANTERIOR CERVICAL DECOMP/DISCECTOMY FUSION N/A 09/27/2016   Procedure: C5-6 Anterior Cervical Discectomy and Fusion, Allograft and Plate;  Surgeon: [name], MD;  [facility];  Service: Orthopedics;  Laterality: N/A;   Back Fusion  2002   BIOPSY  07/19/2018   Procedure: BIOPSY;  Surgeon: [name], MD;  Location: [facility] ENDOSCOPY;  Service: Endoscopy;;   BIOPSY  02/13/2019   Procedure:  BIOPSY;  Surgeon: [name], MD;  Location: [facility] ENDOSCOPY;  Service: Endoscopy;;   CARPAL TUNNEL RELEASE  yates   left   COLONOSCOPY N/A 01/05/2014   Procedure: COLONOSCOPY;  Surgeon: [name], MD;  Location: [facility] ENDOSCOPY;  Service: Endoscopy;  Laterality: N/A;   ELBOW SURGERY     age 75   ENTEROSCOPY N/A 02/13/2019   Procedure: ENTEROSCOPY;  Surgeon: [name], MD;  Location: [facility] ENDOSCOPY;  Service: Endoscopy;  Laterality: N/A;   ESOPHAGOGASTRODUODENOSCOPY (EGD) WITH PROPOFOL N/A 07/19/2018   Procedure: ESOPHAGOGASTRODUODENOSCOPY (EGD) WITH PROPOFOL;  Surgeon: [name], MD;  Location: [place];  Service: Endoscopy;  Laterality: N/A;   EXTERNAL EAR SURGERY Left    EYE SURGERY     "removed white dots under eyelid"   FINGER SURGERY Left    INJECTION KNEE Right 07/21/2022   Procedure: INTRA-ARTICULAR INJECTION UNDER ANESTHESIA;  Surgeon: [name], MD;  Location: [place];  Service: Orthopedics;  Laterality: Right;   Juvara osteomy     KNEE SURGERY     NOSE SURGERY     Rt. toe bunion     skin, shave biopsy  05/03/2016   Left occipital scalp, top of scalp   TONSILLECTOMY     TOTAL HIP ARTHROPLASTY Right 12/17/2020   TOTAL HIP ARTHROPLASTY Right 12/17/2020  Procedure: TOTAL HIP ARTHROPLASTY-DIRECT ANTERIOR;  Surgeon: [name], MD;  Location: [place];  Service: Orthopedics;  Laterality: Right;  needs RNFA   TOTAL KNEE ARTHROPLASTY Left 07/21/2022   Procedure: LEFT TOTAL KNEE ARTHROPLASTY;  Surgeon: [name], MD;  Location: [place];  Service: Orthopedics;  Laterality: Left;   UPPER EXTREMITY ANGIOGRAPHY Bilateral 10/11/2018   Procedure: UPPER EXTREMITY ANGIOGRAPHY;  Surgeon: [name], MD;  Location: [place] CV LAB;  Service: Cardiovascular;  Laterality: Bilateral;   Patient Active Problem List   Diagnosis Date Noted   History of total knee arthroplasty, left 08/04/2022   Hypertrophic cardiomyopathy ([place]) 07/10/2022   Insomnia secondary to chronic pain  12/27/2020   Insomnia due to other mental disorder 12/27/2020   Snoring 12/27/2020   Inadequate sleep hygiene 12/27/2020   S/P total right hip arthroplasty 12/18/2020   Arthritis of right hip 12/17/2020   Encounter for preoperative assessment 12/10/2020   Infection of nail bed of finger of right hand 09/15/2020   Pain in right finger(s) 08/31/2020   Ganglion of right wrist 08/31/2020   COPD mixed type ([place]) 12/26/2018   Healthcare maintenance 12/26/2018   History of fall 11/05/2018   Weight loss 11/05/2018   Weakness 11/05/2018   Gangrene of finger ([place]) 10/09/2018   Raynaud's phenomenon with gangrene ([place]) 10/09/2018   LVH (left ventricular hypertrophy) 10/09/2018   Vasospasm ([place]) 09/06/2018   Hypotension 08/08/2018   Leucocytosis 08/08/2018   Elevated troponin I level 08/08/2018   Nausea and vomiting 08/06/2018   Pneumonia 07/12/2018   COPD exacerbation ([place]) 07/11/2018   Primary osteoarthritis of both feet 12/21/2017   DDD (degenerative disc disease), lumbar 12/21/2017   Primary osteoarthritis of both knees 12/21/2017   History of bilateral carpal tunnel release 12/21/2017   DDD (degenerative disc disease), cervical 11/15/2017   Former smoker 11/15/2017   Bronchiectasis without complication ([place]) 49/44/9675   Impingement syndrome of right shoulder 07/25/2017   Hx of fusion of cervical spine 07/25/2017   Cervical spinal stenosis 09/27/2016   Polypharmacy 06/23/2015   Hyperlipidemia 04/19/2015   Visit for preventive health examination 01/01/2015   Primary osteoarthritis involving multiple joints 01/01/2015   Bipolar affective disorder, currently depressed, moderate ([place])    Bipolar I disorder with mania ([place]) 08/17/2014   Hypertension 10/21/2013   Dizziness 03/25/2013   Medication withdrawal ([place]) 03/05/2013   Diabetes mellitus with renal manifestations, controlled ([place]) 11/10/2012   Alopecia areata 02/06/2012   Obesity (BMI 30-39.9) 02/06/2012   Renal insufficiency  07/16/2011   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    Exertional dyspnea 02/07/2011   Dyspnea on exertion 01/19/2011   DM (diabetes mellitus), type 2 ([place]) 04/22/2010   Carpal tunnel syndrome 02/02/2010   CONSTIPATION 02/02/2010   Primary osteoarthritis of both hands 02/02/2010   CATARACTS 04/13/2009   PAIN IN JOINT, ANKLE AND FOOT 09/16/2008   Eosinophilia 07/21/2008   AFFECTIVE DISORDER 04/21/2008   BACK PAIN, CHRONIC 02/12/2008   LEG PAIN 02/12/2008   ABNORMAL INVOLUNTARY MOVEMENTS 12/04/2007   POSTURAL LIGHTHEADEDNESS 11/04/2007   COLONIC POLYPS, HX OF 11/04/2007   Essential hypertension 06/17/2007   HYPERLIPIDEMIA 04/08/2007   GERD 04/08/2007   LOW BACK PAIN SYNDROME 04/08/2007   CHEST PAIN, RECURRENT 04/08/2007     THERAPY DIAG:  Acute pain of left knee  Difficulty in walking, not elsewhere classified  Stiffness of left knee, not elsewhere classified  Localized edema  Muscle weakness (generalized)  PCP: [name], MD   REFERRING PROVIDER: Lorin Mercy  Thana Farr MD   REFERRING DIAG: [phone] (ICD-10-CM) - Unilateral primary osteoarthritis, left knee   EVAL THERAPY DIAG:  Acute pain of left knee   Stiffness of left knee, not elsewhere classified   Difficulty in walking, not elsewhere classified   Localized edema   Muscle weakness (generalized)   Rationale for Evaluation and Treatment Rehabilitation   ONSET DATE: 07/21/22   SUBJECTIVE:    SUBJECTIVE STATEMENT:  Pt continues to report pain at night. She states that she followed up with Dr Lorin Mercy yesterday, she is awaiting a prescription to help with the night pain.    PERTINENT HISTORY: Lt TKA 07/21/22 PMH: COPD,lumbar DDD and fusion L4-5 2002,,cerv fusion 2017, Rt THA, DM   PAIN:  NPRS scale: 5/10 now, 10/10 when knee "draws up and sticks"  Pain location: left knee, incision is bothersome  Pain description: sharp pain when "it draws up and sticks", aching right now  Aggravating factors: nighttime,  tightness Relieving factors: pain meds, ice, elevation   PRECAUTIONS: None   WEIGHT BEARING RESTRICTIONS No   FALLS:  Has patient fallen in last 6 months? No   LIVING ENVIRONMENT: Lives with: lives with their family Lives in: House/apartment Stairs: No Has following equipment at home: Gilford Rile - 4 wheeled, RW, st cane, grab bars in bathroom. Raised toilet seat,  Pt stating her shower chair doesn't  fit properly.    OCCUPATION: retired   PLOF: Independent   PATIENT GOALS walk without walker, stop hurting     OBJECTIVE:     PATIENT SURVEYS:  08/31/2022 FOTO 47 (was risk adjusted 39, Goal 55) 08/08/22: FOTO intake: 51%  predicted:  55%   COGNITION: 08/08/22 Overall cognitive status: WFL               SENSATION: 08/08/22: WFL   EDEMA:  08/08/22 Rt knee circumferential: 37 centimeters                 Left knee circumferential: 45 centimeters   MUSCLE LENGTH: Hamstrings: Right 50 deg; Left 68 deg     POSTURE: rounded shoulders and forward head   PALPATION: TTP: lateral joint line and around incision site, pt also    LOWER EXTREMITY STRENGTH:   MMT Right 08/08/22 Left 08/08/22 R/L 09/28/2022  Hip flexion 5/5 4/5 4+  Hip extension       Hip abduction       Hip adduction       Hip internal rotation       Hip external rotation       Knee flexion 5/5 3/5 5/3+  Knee extension 5/5 3/5 5/3  Ankle dorsiflexion 5/5 5/5 5/5  Ankle plantarflexion 5/5 5/5 5/5  Ankle inversion       Ankle eversion        (Blank rows = not tested)   LOWER EXTREMITY ROM:   ROM A=active P=passive Right 08/08/22 supine Left 08/08/22 supine Left 08/09/22 Supine Left 08/16/22 Left 08/22/22 supine Left 08/29/22 supine Left 08/31/2022 Left 09/13/2022 Left 09/22/22 Left 09/26/22  Hip flexion              Hip extension              Knee flexion A: 128   A: 70 P: 80   A: 77 P: 85 P: 95 A: 85 P: 94 A:90 Active supine 92 AROM in supine heel slide 95 PROM sitting 93, AROM 88  sitting  P: 100 A: 90  Knee extension A: 0  A: -8 P: -5 A: -2 edema limited A: -12 (seated LAQ) A:-10 P: -8 A: -8 Active supine -2 Seated AROM LAQ : -4  Active quad set supine: -2  A: -2   (Blank rows = not tested)      GAIT: 09/13/2022 : independent ambulation into clinic c decreased stance, lacking full TKE in stance on Lt leg  08/08/2022 Distance walked: 50 feet  Assistive device utilized: Walker - 4 wheeled Level of assistance: Modified independence Comments: antalgic gait pattern     TODAY'S TREATMENT: 09/28/22:  TherEx:  Precor seat 6, half rotations 6 min Step ups on 6 inch step x 10 each LE leading Lateral step ups on 6 inch step x 10 leading each LE TRX squats x 10 Leg Press: bil 81# 3 x 10 Leg Press: left LE: 25# 2 x 10 Manual:  Seated Lt knee flexion c distraction/IR mobilization c movement.    09/22/22 TherEx  Scifit bike seat 8 (able to progress to seat 7 after 3 minutes 30 seconds) x6 minutes full rotations for ROM Step ups 6 inch box x10 L LE BUE support  Mini squats in // bars x12 cues to reduce UE use  Tandem stance 2x30 seconds blue foam pad   Manual  Retrograde massage for edema control LE elevated  Tennis ball massage L LE in elevation  Knee flexion overpressure sitting on side of mat table x10    09/13/2022 Therex: Nu Step Seat far back for extension and up close for flexion 4 minutes each Level 6 AAROM Left knee flexion (Right knee pushes Left into flexion) 10X 10 seconds Quadriceps sets with Left heel prop 5 X 5 seconds Double leg press 81 lbs 15X slow eccentrics Single leg press , performed bilaterally 25# 15X  Step up 4 inch Lt leg loading with single hand assist x 15 Incline gastroc stretch bilateral 30 sec x 3 standing Supine AROM heel slide 5 sec hold x 5  Manual Seated Lt knee flexion c distraction/IR mobilization c movement.  Mid range g2-g3 IR/ER proximal tibia mobs  Vasopneumatic Lt knee 10 minutes 34* Medium  Pressure   PATIENT EDUCATION:  Education details: HEP, POC Person educated: Patient Education method: Explanation, Demonstration, Verbal cues, and Handouts Education comprehension: verbalized understanding, returned demonstration, and verbal cues required    HOME EXERCISE PROGRAM: Access Code: [email or web address] URL: [email or web address] Date: 08/08/2022 Prepared by: Kearney Hard   Exercises - Supine Quad Set  - 3-4 x daily - 7 x weekly - 2 sets - 10 reps - Seated Long Arc Quad  - 3-4 x daily - 7 x weekly - 2 sets - 10 reps - 3-5 seconds hold - Sit to Stand with Counter Support  - 3-4 x daily - 7 x weekly - 2 sets - 10 reps - Seated Hamstring Stretch  - 3-4 x daily - 7 x weekly - 3 reps - 30 seconds hold - Supine Heel Slide with Strap  - 3-4 x daily - 7 x weekly - 10 reps - 5-10 seconds hold   ASSESSMENT:   CLINICAL IMPRESSION: KIRSTINE JACQUIN has progressed well with therapy.  Improved impairments include: Strength, ROM and pain.  Functional improvements include: walking, negotiating stairs with handrail, functional squats and transfers.  Progressions needed include: Pain alleviation at night, strength and functional ROM.  Barriers to progress include: Pain.  Pt continues to show overall improvements with functional movements. She continues to lack full flexion/ extension at this time. Pt's biggest complaint  is pain at night waking her up.  Please see GOALS section for progress on short term and long term goals established at evaluation. Pt will continue to benefit from skilled PT to address continued deficits.    OBJECTIVE IMPAIRMENTS Abnormal gait, decreased balance, decreased mobility, difficulty walking, decreased ROM, decreased strength, increased edema, and pain.    ACTIVITY LIMITATIONS bending, sitting, standing, sleeping, stairs, transfers, bed mobility, toileting, and dressing   PARTICIPATION LIMITATIONS: cleaning, laundry, shopping, and community activity   PERSONAL  FACTORS 3+ comorbidities: see above  are also affecting patient's functional outcome.    REHAB POTENTIAL: Good   CLINICAL DECISION MAKING: Stable/uncomplicated   EVALUATION COMPLEXITY: Low     GOALS: Goals reviewed with patient? Yes   Short term PT Goals (target date for Short term goals are 3 weeks 09/01/22) Patient will demonstrate independent use of home exercise program to maintain progress from in clinic treatments. Goal status: MET 08/29/22   Pt will be able to improve her left knee flexion to >/= 90 degrees actively Goal Status: MET 08/29/22   Long term PT goals (target dates for all long term goals are 12 weeks  11/03/22 )   1. Patient will demonstrate/report pain at worst less than or equal to 2/10 to facilitate minimal limitation in daily activity secondary to pain symptoms. Goal status: On Going 09/28/2022   2. Patient will demonstrate independent use of home exercise program to facilitate ability to maintain/progress functional gains from skilled physical therapy services. Goal status: On Going 09/28/2022   3. Patient will demonstrate FOTO outcome > or = 55 % to indicate reduced disability due to condition. Goal status: On Going 08/31/2022   4.  Patient will demonstrate left LE MMT 5/5 throughout to faciltiate usual transfers, stairs, squatting at [facility] for daily life.    Goal status: On Going 09/28/2022   5.  Pt will improve her 5 time sit to stand to </= 15 seconds c/s UE support.    Goal status: On Going 12/07/22023   6.  Pt will be able to navigate curb step with/without assistive device safely.   Goal status: On Going (cane) 09/28/2022    7. Pt will improve her left knee flexion to >/= 115 degrees for improved functional mobility and safely.                        A. Goal Status: On Going 09/28/2022       PLAN:   PT FREQUENCY: 2x-3/week   PT DURATION: 12 weeks   PLANNED INTERVENTIONS: Therapeutic exercises, Therapeutic activity, Neuro Muscular  re-education, Balance training, Gait training, Patient/Family education, Joint mobilization, Stair training, DME instructions, Dry Needling, Electrical stimulation, Traction, Cryotherapy, Moist heat, Taping, Ultrasound, Ionotophoresis 67m/ml Dexamethasone, and Manual therapy.  All included unless contraindicated     PLAN FOR NEXT SESSION:  FOTO next session please. Priority of manual and therex for mobility improvements in flexion. Quad strengthening.   SRudi HeapPT, DPT 09/28/22  3:15 PM

## 2022-09-28 ENCOUNTER — Ambulatory Visit (INDEPENDENT_AMBULATORY_CARE_PROVIDER_SITE_OTHER): Payer: Medicare HMO | Admitting: Physical Therapy

## 2022-09-28 ENCOUNTER — Other Ambulatory Visit: Payer: Self-pay | Admitting: Internal Medicine

## 2022-09-28 ENCOUNTER — Other Ambulatory Visit: Payer: Self-pay | Admitting: Physician Assistant

## 2022-09-28 ENCOUNTER — Telehealth: Payer: Self-pay | Admitting: Orthopaedic Surgery

## 2022-09-28 DIAGNOSIS — R600 Localized edema: Secondary | ICD-10-CM

## 2022-09-28 DIAGNOSIS — M25562 Pain in left knee: Secondary | ICD-10-CM | POA: Diagnosis not present

## 2022-09-28 DIAGNOSIS — R262 Difficulty in walking, not elsewhere classified: Secondary | ICD-10-CM

## 2022-09-28 DIAGNOSIS — M6281 Muscle weakness (generalized): Secondary | ICD-10-CM

## 2022-09-28 DIAGNOSIS — M25662 Stiffness of left knee, not elsewhere classified: Secondary | ICD-10-CM

## 2022-09-28 NOTE — Telephone Encounter (Signed)
Patient came in after PT to let us know her left knee is still sticking like before and is still in pain and waking her up at night. I gave her the name of the Voltaren gel and advised her that she could buy it at a pharmacy and it did not require a Rx.
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
## 2022-09-28 NOTE — Telephone Encounter (Signed)
Next Visit: 10/11/2022  Last Visit: 05/04/2022  Labs: 08/16/2022 BMP: creatinine 1.49, eGFR 38, BUN/creatinine ratio 11 07/28/2022 CBC: RBC 3.27, hemoglobin 9.8, HCT 30.0, absolute monocytes  989  Eye exam: 08/10/2022   Current Dose per office note on 05/04/2022: Plaquenil 200 mg 1 tablet by mouth daily-reduced dose due to CKD.     OZ:HYQMVHQION arthritis with rheumatoid factor of multiple sites without organ or systems involvement   Last Fill: 01/23/2022  Last fill per dispense hx: 06/29/2022  Okay to refill Plaquenil?

## 2022-10-02 ENCOUNTER — Telehealth: Payer: Self-pay | Admitting: Orthopaedic Surgery

## 2022-10-02 ENCOUNTER — Ambulatory Visit (INDEPENDENT_AMBULATORY_CARE_PROVIDER_SITE_OTHER): Payer: Medicare HMO | Admitting: Physical Therapy

## 2022-10-02 ENCOUNTER — Encounter: Payer: Self-pay | Admitting: Physical Therapy

## 2022-10-02 DIAGNOSIS — M25562 Pain in left knee: Secondary | ICD-10-CM

## 2022-10-02 DIAGNOSIS — R262 Difficulty in walking, not elsewhere classified: Secondary | ICD-10-CM

## 2022-10-02 DIAGNOSIS — R600 Localized edema: Secondary | ICD-10-CM | POA: Diagnosis not present

## 2022-10-02 DIAGNOSIS — M6281 Muscle weakness (generalized): Secondary | ICD-10-CM

## 2022-10-02 DIAGNOSIS — M25662 Stiffness of left knee, not elsewhere classified: Secondary | ICD-10-CM

## 2022-10-02 NOTE — Telephone Encounter (Signed)
Patient came in stating she got the voltaren gel and it did not help at all also the cortisone injections do not work well only last 2-3 days

## 2022-10-02 NOTE — Therapy (Signed)
OUTPATIENT PHYSICAL THERAPY TREATMENT NOTE    Patient Name: Desiree [name] MRN: [id number] DOB:July 03, 1951, 71 y.o., female Today's Date: 10/02/2022  END OF SESSION:   PT End of Session - 10/02/22 1509     Visit Number 11    Number of Visits 24    Date for PT Re-Evaluation 11/03/22    Authorization Type humana requested 12 visit    Authorization Time Period 10/17-1/12    Authorization - Visit Number 10    Authorization - Number of Visits 12    Progress Note Due on Visit 20    PT Start Time 1430    PT Stop Time 1515    PT Time Calculation (min) 45 min    Activity Tolerance Patient tolerated treatment well    Behavior During Therapy [facility] for tasks assessed/performed             Past Medical History:  Diagnosis Date   Acute gastritis without bleeding 08/08/2018   Anemia    Anxiety    Asthma    Bipolar disorder ([place])    CANDIDIASIS, ESOPHAGEAL 07/28/2009   Qualifier: Diagnosis of  By: [name] MD, [name]    Chronic kidney disease    CKD III   Complication of anesthesia    was told she stopped breathing for one of her finger surgeries   COPD (chronic obstructive pulmonary disease) ([place])    Depression    Diabetes mellitus without complication (HCC)    no meds   Dyspnea    At rest,and with activity   FH: colonic polyps    Fractured elbow    right    GERD (gastroesophageal reflux disease)    Headache    migraines   HH (hiatus hernia)    History of carpal tunnel syndrome    History of chest pain    History of transfusion of packed red blood cells    Hyperlipidemia    Hypertension    Mitral valve prolapse    Neuroleptic-induced tardive dyskinesia    Osteoarthritis of more than one site    Pneumonia    Seasonal allergies    Stroke [facility])    Past Surgical History:  Procedure Laterality Date   AMPUTATION Left 10/14/2018   Procedure: AMPUTATION LEFT LONG FINGER TIP;  Surgeon: Dayna Barker, MD;  Location: [place];  Service: Plastics;  Laterality: Left;   ANTERIOR  CERVICAL DECOMP/DISCECTOMY FUSION  09/27/2016   C5-6 anterior cervical discectomy and fusion, allograft and plate/notes 09/27/2016   ANTERIOR CERVICAL DECOMP/DISCECTOMY FUSION N/A 09/27/2016   Procedure: C5-6 Anterior Cervical Discectomy and Fusion, Allograft and Plate;  Surgeon: [name], MD;  Location: [place];  Service: Orthopedics;  Laterality: N/A;   Back Fusion  2002   BIOPSY  07/19/2018   Procedure: BIOPSY;  Surgeon: [name], MD;  Location: [facility] ENDOSCOPY;  Service: Endoscopy;;   BIOPSY  02/13/2019   Procedure: BIOPSY;  Surgeon: [name], MD;  Location: [facility] ENDOSCOPY;  Service: Endoscopy;;   CARPAL TUNNEL RELEASE  yates   left   COLONOSCOPY N/A 01/05/2014   Procedure: COLONOSCOPY;  Surgeon: [name], MD;  Location: [facility] ENDOSCOPY;  Service: Endoscopy;  Laterality: N/A;   ELBOW SURGERY     age 64   ENTEROSCOPY N/A 02/13/2019   Procedure: ENTEROSCOPY;  Surgeon: [name], MD;  Location: [facility] ENDOSCOPY;  Service: Endoscopy;  Laterality: N/A;   ESOPHAGOGASTRODUODENOSCOPY (EGD) WITH PROPOFOL N/A 07/19/2018   Procedure: ESOPHAGOGASTRODUODENOSCOPY (EGD) WITH PROPOFOL;  Surgeon: [name], MD;  Location: [facility] ENDOSCOPY;  Service: Endoscopy;  Laterality: N/A;   EXTERNAL EAR SURGERY Left    EYE SURGERY     "removed white dots under eyelid"   FINGER SURGERY Left    INJECTION KNEE Right 07/21/2022   Procedure: INTRA-ARTICULAR INJECTION UNDER ANESTHESIA;  Surgeon: [name], MD;  Location: [place];  Service: Orthopedics;  Laterality: Right;   Juvara osteomy     KNEE SURGERY     NOSE SURGERY     Rt. toe bunion     skin, shave biopsy  05/03/2016   Left occipital scalp, top of scalp   TONSILLECTOMY     TOTAL HIP ARTHROPLASTY Right 12/17/2020   TOTAL HIP ARTHROPLASTY Right 12/17/2020   Procedure: TOTAL HIP ARTHROPLASTY-DIRECT ANTERIOR;  Surgeon: [name], MD;  Location: [place];  Service: Orthopedics;  Laterality: Right;  needs RNFA   TOTAL KNEE ARTHROPLASTY Left 07/21/2022    Procedure: LEFT TOTAL KNEE ARTHROPLASTY;  Surgeon: [name], MD;  Location: [place];  Service: Orthopedics;  Laterality: Left;   UPPER EXTREMITY ANGIOGRAPHY Bilateral 10/11/2018   Procedure: UPPER EXTREMITY ANGIOGRAPHY;  Surgeon: [name], MD;  Location: [place] [facility];  Service: Cardiovascular;  Laterality: Bilateral;   Patient Active Problem List   Diagnosis Date Noted   History of total knee arthroplasty, left 08/04/2022   Hypertrophic cardiomyopathy ([place]) 07/10/2022   Insomnia secondary to chronic pain 12/27/2020   Insomnia due to other mental disorder 12/27/2020   Snoring 12/27/2020   Inadequate sleep hygiene 12/27/2020   S/P total right hip arthroplasty 12/18/2020   Arthritis of right hip 12/17/2020   Encounter for preoperative assessment 12/10/2020   Infection of nail bed of finger of right hand 09/15/2020   Pain in right finger(s) 08/31/2020   Ganglion of right wrist 08/31/2020   COPD mixed type ([place]) 12/26/2018   Healthcare maintenance 12/26/2018   History of fall 11/05/2018   Weight loss 11/05/2018   Weakness 11/05/2018   Gangrene of finger ([place]) 10/09/2018   Raynaud's phenomenon with gangrene ([place]) 10/09/2018   LVH (left ventricular hypertrophy) 10/09/2018   Vasospasm ([place]) 09/06/2018   Hypotension 08/08/2018   Leucocytosis 08/08/2018   Elevated troponin I level 08/08/2018   Nausea and vomiting 08/06/2018   Pneumonia 07/12/2018   COPD exacerbation ([place]) 07/11/2018   Primary osteoarthritis of both feet 12/21/2017   DDD (degenerative disc disease), lumbar 12/21/2017   Primary osteoarthritis of both knees 12/21/2017   History of bilateral carpal tunnel release 12/21/2017   DDD (degenerative disc disease), cervical 11/15/2017   Former smoker 11/15/2017   Bronchiectasis without complication ([place]) 42/35/3614   Impingement syndrome of right shoulder 07/25/2017   Hx of fusion of cervical spine 07/25/2017   Cervical spinal stenosis 09/27/2016    Polypharmacy 06/23/2015   Hyperlipidemia 04/19/2015   Visit for preventive health examination 01/01/2015   Primary osteoarthritis involving multiple joints 01/01/2015   Bipolar affective disorder, currently depressed, moderate ([place])    Bipolar I disorder with mania ([place]) 08/17/2014   Hypertension 10/21/2013   Dizziness 03/25/2013   Medication withdrawal ([place]) 03/05/2013   Diabetes mellitus with renal manifestations, controlled ([place]) 11/10/2012   Alopecia areata 02/06/2012   Obesity (BMI 30-39.9) 02/06/2012   Renal insufficiency 07/16/2011   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    Exertional dyspnea 02/07/2011   Dyspnea on exertion 01/19/2011   DM (diabetes mellitus), type 2 ([place]) 04/22/2010   Carpal tunnel syndrome 02/02/2010   CONSTIPATION 02/02/2010   Primary osteoarthritis of  both hands 02/02/2010   CATARACTS 04/13/2009   PAIN IN JOINT, ANKLE AND FOOT 09/16/2008   Eosinophilia 07/21/2008   AFFECTIVE DISORDER 04/21/2008   BACK PAIN, CHRONIC 02/12/2008   LEG PAIN 02/12/2008   ABNORMAL INVOLUNTARY MOVEMENTS 12/04/2007   POSTURAL LIGHTHEADEDNESS 11/04/2007   COLONIC POLYPS, HX OF 11/04/2007   Essential hypertension 06/17/2007   HYPERLIPIDEMIA 04/08/2007   GERD 04/08/2007   LOW BACK PAIN SYNDROME 04/08/2007   CHEST PAIN, RECURRENT 04/08/2007     THERAPY DIAG:  Acute pain of left knee  Difficulty in walking, not elsewhere classified  Stiffness of left knee, not elsewhere classified  Localized edema  Muscle weakness (generalized)  PCP: [name], MD   REFERRING PROVIDER: [name] MD   REFERRING DIAG: M17.12 (ICD-10-CM) - Unilateral primary osteoarthritis, left knee   EVAL THERAPY DIAG:  Acute pain of left knee   Stiffness of left knee, not elsewhere classified   Difficulty in walking, not elsewhere classified   Localized edema   Muscle weakness (generalized)   Rationale for Evaluation and Treatment Rehabilitation   ONSET DATE: 07/21/22    SUBJECTIVE:    SUBJECTIVE STATEMENT:  Pt reporting 5-6/10 upon arrival. Pt reporting 710 with bending activities. Pt reporting the past 2 nights she has not been able to sleep.    PERTINENT HISTORY: Lt TKA 07/21/22 PMH: COPD,lumbar DDD and fusion L4-5 2002,,cerv fusion 2017, Rt THA, DM   PAIN:  NPRS scale: 7/10 Pain location: left knee Pain description: sharp pain when "it draws up and sticks", aching right now  Aggravating factors: nighttime, tightness Relieving factors: pain meds, ice, elevation   PRECAUTIONS: None   WEIGHT BEARING RESTRICTIONS No   FALLS:  Has patient fallen in last 6 months? No   LIVING ENVIRONMENT: Lives with: lives with their family Lives in: House/apartment Stairs: No Has following equipment at home: Gilford Rile - 4 wheeled, RW, st cane, grab bars in bathroom. Raised toilet seat,  Pt stating her shower chair doesn't  fit properly.    OCCUPATION: retired   PLOF: Independent   PATIENT GOALS walk without walker, stop hurting     OBJECTIVE:     PATIENT SURVEYS:  10/02/22: FOTO 60% (visit 10) 08/31/2022 FOTO 47 (was risk adjusted 39, Goal 55) 08/08/22: FOTO intake: 51%  predicted:  55%   COGNITION: 08/08/22 Overall cognitive status: WFL               SENSATION: 08/08/22: WFL   EDEMA:  08/08/22 Rt knee circumferential: 37 centimeters                 Left knee circumferential: 45 centimeters   MUSCLE LENGTH: Hamstrings: Right 50 deg; Left 68 deg     POSTURE: rounded shoulders and forward head   PALPATION: TTP: lateral joint line and around incision site, pt also    LOWER EXTREMITY STRENGTH:   MMT Right 08/08/22 Left 08/08/22 R/L 09/28/2022  Hip flexion 5/5 4/5 4+  Hip extension       Hip abduction       Hip adduction       Hip internal rotation       Hip external rotation       Knee flexion 5/5 3/5 5/3+  Knee extension 5/5 3/5 5/3  Ankle dorsiflexion 5/5 5/5 5/5  Ankle plantarflexion 5/5 5/5 5/5  Ankle inversion       Ankle  eversion        (Blank rows = not tested)  LOWER EXTREMITY ROM:   ROM A=active P=passive Right 08/08/22 supine Left 08/08/22 supine Left 08/09/22 Supine Left 08/16/22 Left 08/22/22 supine Left 08/29/22 supine Left 08/31/2022 Left 09/13/2022 Left 09/22/22 Left 09/26/22 Left 10/02/22  Hip flexion               Hip extension               Knee flexion A: 128   A: 70 P: 80   A: 77 P: 85 P: 95 A: 85 P: 94 A:90 Active supine 92 AROM in supine heel slide 95 PROM sitting 93, AROM 88 sitting  P: 100 A: 90 Supine  P: 98   Knee extension A: 0   A: -8 P: -5 A: -2 edema limited A: -12 (seated LAQ) A:-10 P: -8 A: -8 Active supine -2 Seated AROM LAQ : -4  Active quad set supine: -2  A: -2 A: -2   (Blank rows = not tested)      GAIT: 09/13/2022 : independent ambulation into clinic c decreased stance, lacking full TKE in stance on Lt leg  08/08/2022 Distance walked: 50 feet  Assistive device utilized: Walker - 4 wheeled Level of assistance: Modified independence Comments: antalgic gait pattern     TODAY'S TREATMENT: 10/02/22:  TherEx:  Scifit bike: seat at 8 x 8 minutes Lunging froward on 6 inch step x 15 c left LE TRX squats x 10 Leg Press: bil 81# 3 x 10 Leg Press: left LE: 25# 2 x 10 Standing on Airex: feet together x 30 sec with finger tap support Standing on Airex: 1/2 tandem stance x 30 sec each LE placed forward with finger tap UE support Supine hip flexion AAROM using towel x 5 Supine SLR: 2 x 10 Manual:  Seated Lt knee flexion c distraction/IR mobilization c movement.    09/28/22:  TherEx:  Precor seat 6, half rotations 6 min Step ups on 6 inch step x 10 each LE leading Lateral step ups on 6 inch step x 10 leading each LE TRX squats x 10 Leg Press: bil 81# 3 x 10 Leg Press: left LE: 25# 2 x 10 Manual:  Seated Lt knee flexion c distraction/IR mobilization c movement.    09/22/22 TherEx  Scifit bike seat 8 (able to progress to seat 7 after 3  minutes 30 seconds) x6 minutes full rotations for ROM Step ups 6 inch box x10 L LE BUE support  Mini squats in // bars x12 cues to reduce UE use  Tandem stance 2x30 seconds blue foam pad   Manual  Retrograde massage for edema control LE elevated  Tennis ball massage L LE in elevation  Knee flexion overpressure sitting on side of mat table x10       PATIENT EDUCATION:  Education details: HEP, POC Person educated: Patient Education method: Consulting civil engineer, Media planner, Verbal cues, and Handouts Education comprehension: verbalized understanding, returned demonstration, and verbal cues required    HOME EXERCISE PROGRAM: Access Code: [email or web address] URL: [email or web address] Date: 08/08/2022 Prepared by: Kearney Hard   Exercises - Supine Quad Set  - 3-4 x daily - 7 x weekly - 2 sets - 10 reps - Seated Long Arc Quad  - 3-4 x daily - 7 x weekly - 2 sets - 10 reps - 3-5 seconds hold - Sit to Stand with Counter Support  - 3-4 x daily - 7 x weekly - 2 sets - 10 reps - Seated Hamstring Stretch  - 3-4 x daily - 7 x  weekly - 3 reps - 30 seconds hold - Supine Heel Slide with Strap  - 3-4 x daily - 7 x weekly - 10 reps - 5-10 seconds hold   ASSESSMENT:   CLINICAL IMPRESSION: Pt reporting having a bad weekend. Pt stating pain with bending is currently 7/10 and  left knee pain at rest is 5-6/10. Pt still reporting difficulty sleeping. Pt edu in using pillows for sleeping positioning. Pt also encouraged to use ice and elevation at home to help with pain. Pt still with limitations in flexion/extension but has made progress since beginning therapy. Recommending continue skilled PT services.    OBJECTIVE IMPAIRMENTS Abnormal gait, decreased balance, decreased mobility, difficulty walking, decreased ROM, decreased strength, increased edema, and pain.    ACTIVITY LIMITATIONS bending, sitting, standing, sleeping, stairs, transfers, bed mobility, toileting, and dressing   PARTICIPATION  LIMITATIONS: cleaning, laundry, shopping, and community activity   PERSONAL FACTORS 3+ comorbidities: see above  are also affecting patient's functional outcome.    REHAB POTENTIAL: Good   CLINICAL DECISION MAKING: Stable/uncomplicated   EVALUATION COMPLEXITY: Low     GOALS: Goals reviewed with patient? Yes   Short term PT Goals (target date for Short term goals are 3 weeks 09/01/22) Patient will demonstrate independent use of home exercise program to maintain progress from in clinic treatments. Goal status: MET 08/29/22   Pt will be able to improve her left knee flexion to >/= 90 degrees actively Goal Status: MET 08/29/22   Long term PT goals (target dates for all long term goals are 12 weeks  11/03/22 )   1. Patient will demonstrate/report pain at worst less than or equal to 2/10 to facilitate minimal limitation in daily activity secondary to pain symptoms. Goal status: On Going 10/02/2022   2. Patient will demonstrate independent use of home exercise program to facilitate ability to maintain/progress functional gains from skilled physical therapy services. Goal status: On Going 10/02/2022   3. Patient will demonstrate FOTO outcome > or = 55 % to indicate reduced disability due to condition. Goal status: On Going 08/31/2022   4.  Patient will demonstrate left LE MMT 5/5 throughout to faciltiate usual transfers, stairs, squatting at [facility] for daily life.    Goal status: On Going 09/28/2022   5.  Pt will improve her 5 time sit to stand to </= 15 seconds c/s UE support.    Goal status: On Going 12/07/22023   6.  Pt will be able to navigate curb step with/without assistive device safely.   Goal status: On Going (cane) 10/02/2022    7. Pt will improve her left knee flexion to >/= 115 degrees for improved functional mobility and safely.                        A. Goal Status: On Going 10/02/2022       PLAN:   PT FREQUENCY: 2x-3/week   PT DURATION: 12 weeks   PLANNED  INTERVENTIONS: Therapeutic exercises, Therapeutic activity, Neuro Muscular re-education, Balance training, Gait training, Patient/Family education, Joint mobilization, Stair training, DME instructions, Dry Needling, Electrical stimulation, Traction, Cryotherapy, Moist heat, Taping, Ultrasound, Ionotophoresis 36m/ml Dexamethasone, and Manual therapy.  All included unless contraindicated     PLAN FOR NEXT SESSION:  Priority of manual and therex for mobility improvements in flexion. Quad strengthening.   JKearney Hard PT, MPT 10/02/22 3:10 PM   10/02/22  3:10 PM

## 2022-10-04 ENCOUNTER — Encounter: Payer: Self-pay | Admitting: Physical Therapy

## 2022-10-04 ENCOUNTER — Ambulatory Visit (INDEPENDENT_AMBULATORY_CARE_PROVIDER_SITE_OTHER): Payer: Medicare HMO | Admitting: Physical Therapy

## 2022-10-04 DIAGNOSIS — M25662 Stiffness of left knee, not elsewhere classified: Secondary | ICD-10-CM | POA: Diagnosis not present

## 2022-10-04 DIAGNOSIS — R600 Localized edema: Secondary | ICD-10-CM

## 2022-10-04 DIAGNOSIS — M25562 Pain in left knee: Secondary | ICD-10-CM

## 2022-10-04 DIAGNOSIS — R262 Difficulty in walking, not elsewhere classified: Secondary | ICD-10-CM | POA: Diagnosis not present

## 2022-10-04 NOTE — Therapy (Addendum)
OUTPATIENT PHYSICAL THERAPY TREATMENT NOTE    Patient Name: TRAEH MILROY MRN: [id number] DOB:[birth year], 71 y.o., female Today's Date: 10/04/2022  END OF SESSION:   PT End of Session - 10/04/22 1457     Visit Number 12    Number of Visits 24    Date for PT Re-Evaluation 11/03/22    Authorization Type humana requested 12 visit    Authorization Time Period 10/17-1/12    Authorization - Visit Number 11    Authorization - Number of Visits 12    Progress Note Due on Visit 20    PT Start Time 1430    PT Stop Time 1508    PT Time Calculation (min) 38 min    Activity Tolerance Patient tolerated treatment well    Behavior During Therapy WFL for tasks assessed/performed              Past Medical History:  Diagnosis Date   Acute gastritis without bleeding 08/08/2018   Anemia    Anxiety    Asthma    Bipolar disorder ([place])    CANDIDIASIS, ESOPHAGEAL 07/28/2009   Qualifier: Diagnosis of  By: [name] MD, [name]    Chronic kidney disease    CKD III   Complication of anesthesia    was told she stopped breathing for one of her finger surgeries   COPD (chronic obstructive pulmonary disease) ([place])    Depression    Diabetes mellitus without complication (HCC)    no meds   Dyspnea    At rest,and with activity   FH: colonic polyps    Fractured elbow    right    GERD (gastroesophageal reflux disease)    Headache    migraines   HH (hiatus hernia)    History of carpal tunnel syndrome    History of chest pain    History of transfusion of packed red blood cells    Hyperlipidemia    Hypertension    Mitral valve prolapse    Neuroleptic-induced tardive dyskinesia    Osteoarthritis of more than one site    Pneumonia    Seasonal allergies    Stroke [facility])    Past Surgical History:  Procedure Laterality Date   AMPUTATION Left 10/14/2018   Procedure: AMPUTATION LEFT LONG FINGER TIP;  Surgeon: Dayna Barker, MD;  Location: [place];  Service: Plastics;  Laterality: Left;    ANTERIOR CERVICAL DECOMP/DISCECTOMY FUSION  09/27/2016   C5-6 anterior cervical discectomy and fusion, allograft and plate/notes 09/27/2016   ANTERIOR CERVICAL DECOMP/DISCECTOMY FUSION N/A 09/27/2016   Procedure: C5-6 Anterior Cervical Discectomy and Fusion, Allograft and Plate;  Surgeon: [name], MD;  Location: [place];  Service: Orthopedics;  Laterality: N/A;   Back Fusion  2002   BIOPSY  07/19/2018   Procedure: BIOPSY;  Surgeon: [name], MD;  Location[facility] END[facility]COPY;  Service: Endoscopy;;   BIOPSY  02/13/2019   Procedure: BIOPSY;  Surgeon: [name], MD;  Location: [facility] ENDOSCOPY;  Service: Endoscopy;;   CARPAL TUNNEL RELEASE  yates   left   COLONOSCOPY N/A 01/05/2014   Procedure: COLONOSCOPY;  Surgeon: [name], MD;  Location: [facility] ENDOSCOPY;  Service: Endoscopy;  Laterality: N/A;   ELBOW SURGERY     age 67   ENTEROSCOPY N/A 02/13/2019   Procedure: ENTEROSCOPY;  Surgeon: [name], MD;  Location: [facility] ENDOSCOPY;  Service: Endoscopy;  Laterality: N/A;   ESOPHAGOGASTRODUODENOSCOPY (EGD) WITH PROPOFOL N/A 07/19/2018   Procedure: ESOPHAGOGASTRODUODENOSCOPY (EGD) WITH PROPOFOL;  Surgeon: [name], MD;  Location: [facility] ENDOSCOPY;  Service: Endoscopy;  Laterality: N/A;   EXTERNAL EAR SURGERY Left    EYE SURGERY     "removed white dots under eyelid"   FINGER SURGERY Left    INJECTION KNEE Right 07/21/2022   Procedure: INTRA-ARTICULAR INJECTION UNDER ANESTHESIA;  Surgeon: [name], MD;  Location: [place];  Service: Orthopedics;  Laterality: Right;   Juvara osteomy     KNEE SURGERY     NOSE SURGERY     Rt. toe bunion     skin, shave biopsy  05/03/2016   Left occipital scalp, top of scalp   TONSILLECTOMY     TOTAL HIP ARTHROPLASTY Right 12/17/2020   TOTAL HIP ARTHROPLASTY Right 12/17/2020   Procedure: TOTAL HIP ARTHROPLASTY-DIRECT ANTERIOR;  Surgeon: [name], MD;  Location: [place];  Service: Orthopedics;  Laterality: Right;  needs RNFA   TOTAL KNEE ARTHROPLASTY Left 07/21/2022    Procedure: LEFT TOTAL KNEE ARTHROPLASTY;  Surgeon: [name], MD;  Location: [place];  Service: Orthopedics;  Laterality: Left;   UPPER EXTREMITY ANGIOGRAPHY Bilateral 10/11/2018   Procedure: UPPER EXTREMITY ANGIOGRAPHY;  Surgeon: [name], MD;  Location: [place] CV LAB;  Service: Cardiovascular;  Laterality: Bilateral;   Patient Active Problem List   Diagnosis Date Noted   History of total knee arthroplasty, left 08/04/2022   Hypertrophic cardiomyopathy ([place]) 07/10/2022   Insomnia secondary to chronic pain 12/27/2020   Insomnia due to other mental disorder 12/27/2020   Snoring 12/27/2020   Inadequate sleep hygiene 12/27/2020   S/P total right hip arthroplasty 12/18/2020   Arthritis of right hip 12/17/2020   Encounter for preoperative assessment 12/10/2020   Infection of nail bed of finger of right hand 09/15/2020   Pain in right finger(s) 08/31/2020   Ganglion of right wrist 08/31/2020   COPD mixed type ([place]) 12/26/2018   Healthcare maintenance 12/26/2018   History of fall 11/05/2018   Weight loss 11/05/2018   Weakness 11/05/2018   Gangrene of finger ([place]) 10/09/2018   Raynaud's phenomenon with gangrene ([place]) 10/09/2018   LVH (left ventricular hypertrophy) 10/09/2018   Vasospasm ([place]) 09/06/2018   Hypotension 08/08/2018   Leucocytosis 08/08/2018   Elevated troponin I level 08/08/2018   Nausea and vomiting 08/06/2018   Pneumonia 07/12/2018   COPD exacerbation ([place]) 07/11/2018   Primary osteoarthritis of both feet 12/21/2017   DDD (degenerative disc disease), lumbar 12/21/2017   Primary osteoarthritis of both knees 12/21/2017   History of bilateral carpal tunnel release 12/21/2017   DDD (degenerative disc disease), cervical 11/15/2017   Former smoker 11/15/2017   Bronchiectasis without complication ([place]) 65/79/0383   Impingement syndrome of right shoulder 07/25/2017   Hx of fusion of cervical spine 07/25/2017   Cervical spinal stenosis 09/27/2016    Polypharmacy 06/23/2015   Hyperlipidemia 04/19/2015   Visit for preventive health examination 01/01/2015   Primary osteoarthritis involving multiple joints 01/01/2015   Bipolar affective disorder, currently depressed, moderate ([place])    Bipolar I disorder with mania (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    Exertional dyspnea 02/07/2011   Dyspnea on exertion 01/19/2011   DM (diabetes mellitus), type 2 ([place]) 04/22/2010   Carpal tunnel syndrome 02/02/2010   CONSTIPATION 02/02/2010   Primary osteoarthritis of  both hands 02/02/2010   CATARACTS 04/13/2009   PAIN IN JOINT, ANKLE AND FOOT 09/16/2008   Eosinophilia 07/21/2008   AFFECTIVE DISORDER 04/21/2008   BACK PAIN, CHRONIC 02/12/2008   LEG PAIN 02/12/2008   ABNORMAL INVOLUNTARY MOVEMENTS 12/04/2007   POSTURAL LIGHTHEADEDNESS 11/04/2007   COLONIC POLYPS, HX OF 11/04/2007   Essential hypertension 06/17/2007   HYPERLIPIDEMIA 04/08/2007   GERD 04/08/2007   LOW BACK PAIN SYNDROME 04/08/2007   CHEST PAIN, RECURRENT 04/08/2007     THERAPY DIAG:  Acute pain of left knee  Difficulty in walking, not elsewhere classified  Stiffness of left knee, not elsewhere classified  Localized edema  PCP: [name], MD   REFERRING PROVIDER: [name] MD   REFERRING DIAG: M17.12 (ICD-10-CM) - Unilateral primary osteoarthritis, left knee   EVAL THERAPY DIAG:  Acute pain of left knee   Stiffness of left knee, not elsewhere classified   Difficulty in walking, not elsewhere classified   Localized edema   Muscle weakness (generalized)   Rationale for Evaluation and Treatment Rehabilitation   ONSET DATE: 07/21/22   SUBJECTIVE:    SUBJECTIVE  STATEMENT:  Pt reporting about 6/10 pain overall   PERTINENT HISTORY: Lt TKA 07/21/22 PMH: COPD,lumbar DDD and fusion L4-5 2002,,cerv fusion 2017, Rt THA, DM   PAIN:  NPRS scale: 7/10 Pain location: left knee Pain description: sharp pain when "it draws up and sticks", aching right now  Aggravating factors: nighttime, tightness Relieving factors: pain meds, ice, elevation   PRECAUTIONS: None   WEIGHT BEARING RESTRICTIONS No   FALLS:  Has patient fallen in last 6 months? No   LIVING ENVIRONMENT: Lives with: lives with their family Lives in: House/apartment Stairs: No Has following equipment at home: Gilford Rile - 4 wheeled, RW, st cane, grab bars in bathroom. Raised toilet seat,  Pt stating her shower chair doesn't  fit properly.    OCCUPATION: retired   PLOF: Independent   PATIENT GOALS walk without walker, stop hurting     OBJECTIVE:     PATIENT SURVEYS:  10/02/22: FOTO 60% (visit 10) 08/31/2022 FOTO 47 (was risk adjusted 39, Goal 55) 08/08/22: FOTO intake: 51%  predicted:  55%   COGNITION: 08/08/22 Overall cognitive status: WFL               SENSATION: 08/08/22: WFL   EDEMA:  08/08/22 Rt knee circumferential: 37 centimeters                 Left knee circumferential: 45 centimeters   MUSCLE LENGTH: Hamstrings: Right 50 deg; Left 68 deg     POSTURE: rounded shoulders and forward head   PALPATION: TTP: lateral joint line and around incision site, pt also    LOWER EXTREMITY STRENGTH:   MMT Right 08/08/22 Left 08/08/22 R/L 09/28/2022  Hip flexion 5/5 4/5 4+  Hip extension       Hip abduction       Hip adduction       Hip internal rotation       Hip external rotation       Knee flexion 5/5 3/5 5/3+  Knee extension 5/5 3/5 5/3  Ankle dorsiflexion 5/5 5/5 5/5  Ankle plantarflexion 5/5 5/5 5/5  Ankle inversion       Ankle eversion        (Blank rows = not tested)   LOWER EXTREMITY ROM:   ROM A=active P=passive Right 08/08/22 supine  Left 08/08/22 supine Left 08/09/22 Supine Left 08/16/22 Left 08/22/22 supine Left  08/29/22 supine Left 08/31/2022 Left 09/13/2022 Left 09/22/22 Left 09/26/22 Left 10/02/22  Hip flexion               Hip extension               Knee flexion A: 128   A: 70 P: 80   A: 77 P: 85 P: 95 A: 85 P: 94 A:90 Active supine 92 AROM in supine heel slide 95 PROM sitting 93, AROM 88 sitting  P: 100 A: 90 Supine  P: 98   Knee extension A: 0   A: -8 P: -5 A: -2 edema limited A: -12 (seated LAQ) A:-10 P: -8 A: -8 Active supine -2 Seated AROM LAQ : -4  Active quad set supine: -2  A: -2 A: -2   (Blank rows = not tested)      GAIT: 09/13/2022 : independent ambulation into clinic c decreased stance, lacking full TKE in stance on Lt leg  08/08/2022 Distance walked: 50 feet  Assistive device utilized: Walker - 4 wheeled Level of assistance: Modified independence Comments: antalgic gait pattern     TODAY'S TREATMENT: 10/04/22:  TherEx:  Scifit bike: seat at 8 L2 x 10 minutes Lunging froward on 6 inch step x 10 c left LE holding 5 sec stretch TRX squats x 10 Leg Press: bil 81# 3 x 10 Leg Press: left LE: 25# 2 x 10 Standing on Airex: feet together x 1 min with finger tap support Standing on Airex: 1/2 tandem stance x 30 sec each LE placed forward with finger tap UE support Seated knee flexion stretch AAROM 5 sec X 10 Supine SLR: 2 x 10 Manual:  Seated Lt knee flexion c distraction/IR mobilization c movement. Lt knee extension PROM with overpressure and manual hamstring stretching  10/02/22:  TherEx:  Scifit bike: seat at 8 x 8 minutes Lunging froward on 6 inch step x 15 c left LE TRX squats x 10 Leg Press: bil 81# 3 x 10 Leg Press: left LE: 25# 2 x 10 Standing on Airex: feet together x 30 sec with finger tap support Standing on Airex: 1/2 tandem stance x 30 sec each LE placed forward with finger tap UE support Supine hip flexion AAROM using towel x 5 Supine SLR: 2 x 10 Manual:   Seated Lt knee flexion c distraction/IR mobilization c movement.    09/28/22:  TherEx:  Precor seat 6, half rotations 6 min Step ups on 6 inch step x 10 each LE leading Lateral step ups on 6 inch step x 10 leading each LE TRX squats x 10 Leg Press: bil 81# 3 x 10 Leg Press: left LE: 25# 2 x 10 Manual:  Seated Lt knee flexion c distraction/IR mobilization c movement.    09/22/22 TherEx  Scifit bike seat 8 (able to progress to seat 7 after 3 minutes 30 seconds) x6 minutes full rotations for ROM Step ups 6 inch box x10 L LE BUE support  Mini squats in // bars x12 cues to reduce UE use  Tandem stance 2x30 seconds blue foam pad   Manual  Retrograde massage for edema control LE elevated  Tennis ball massage L LE in elevation  Knee flexion overpressure sitting on side of mat table x10       PATIENT EDUCATION:  Education details: HEP, POC Person educated: Patient Education method: Consulting civil engineer, Media planner, Verbal cues, and Handouts Education comprehension: verbalized understanding, returned demonstration, and verbal cues required    HOME EXERCISE PROGRAM: Access  Code: [email or web address] URL: [email or web address] Date: 08/08/2022 Prepared by: Kearney Hard   Exercises - Supine Quad Set  - 3-4 x daily - 7 x weekly - 2 sets - 10 reps - Seated Long Arc Quad  - 3-4 x daily - 7 x weekly - 2 sets - 10 reps - 3-5 seconds hold - Sit to Stand with Counter Support  - 3-4 x daily - 7 x weekly - 2 sets - 10 reps - Seated Hamstring Stretch  - 3-4 x daily - 7 x weekly - 3 reps - 30 seconds hold - Supine Heel Slide with Strap  - 3-4 x daily - 7 x weekly - 10 reps - 5-10 seconds hold   ASSESSMENT:   CLINICAL IMPRESSION: Pt reporting her knee still draws up at night and becomes stiff. We worked to improve her knee ROM and strength with exercises and manual therapy as tolerated.    OBJECTIVE IMPAIRMENTS Abnormal gait, decreased balance, decreased mobility, difficulty walking,  decreased ROM, decreased strength, increased edema, and pain.    ACTIVITY LIMITATIONS bending, sitting, standing, sleeping, stairs, transfers, bed mobility, toileting, and dressing   PARTICIPATION LIMITATIONS: cleaning, laundry, shopping, and community activity   PERSONAL FACTORS 3+ comorbidities: see above  are also affecting patient's functional outcome.    REHAB POTENTIAL: Good   CLINICAL DECISION MAKING: Stable/uncomplicated   EVALUATION COMPLEXITY: Low     GOALS: Goals reviewed with patient? Yes   Short term PT Goals (target date for Short term goals are 3 weeks 09/01/22) Patient will demonstrate independent use of home exercise program to maintain progress from in clinic treatments. Goal status: MET 08/29/22   Pt will be able to improve her left knee flexion to >/= 90 degrees actively Goal Status: MET 08/29/22   Long term PT goals (target dates for all long term goals are 12 weeks  11/03/22 )   1. Patient will demonstrate/report pain at worst less than or equal to 2/10 to facilitate minimal limitation in daily activity secondary to pain symptoms. Goal status: On Going 10/02/2022   2. Patient will demonstrate independent use of home exercise program to facilitate ability to maintain/progress functional gains from skilled physical therapy services. Goal status: On Going 10/02/2022   3. Patient will demonstrate FOTO outcome > or = 55 % to indicate reduced disability due to condition. Goal status: On Going 08/31/2022   4.  Patient will demonstrate left LE MMT 5/5 throughout to faciltiate usual transfers, stairs, squatting at [facility] for daily life.    Goal status: On Going 09/28/2022   5.  Pt will improve her 5 time sit to stand to </= 15 seconds c/s UE support.    Goal status: On Going 12/07/22023   6.  Pt will be able to navigate curb step with/without assistive device safely.   Goal status: On Going (cane) 10/02/2022    7. Pt will improve her left knee flexion to >/= 115  degrees for improved functional mobility and safely.                        A. Goal Status: On Going 10/02/2022       PLAN:   PT FREQUENCY: 2x-3/week   PT DURATION: 12 weeks   PLANNED INTERVENTIONS: Therapeutic exercises, Therapeutic activity, Neuro Muscular re-education, Balance training, Gait training, Patient/Family education, Joint mobilization, Stair training, DME instructions, Dry Needling, Electrical stimulation, Traction, Cryotherapy, Moist heat, Taping, Ultrasound, Ionotophoresis 41m/ml Dexamethasone, and Manual therapy.  All included  unless contraindicated     PLAN FOR NEXT SESSION:  Needs new [place] Staggers for more PT   Elsie Ra, PT, DPT 10/04/22 3:06 PM

## 2022-10-09 ENCOUNTER — Ambulatory Visit (INDEPENDENT_AMBULATORY_CARE_PROVIDER_SITE_OTHER): Payer: Medicare HMO | Admitting: Physical Therapy

## 2022-10-09 DIAGNOSIS — R600 Localized edema: Secondary | ICD-10-CM | POA: Diagnosis not present

## 2022-10-09 DIAGNOSIS — M6281 Muscle weakness (generalized): Secondary | ICD-10-CM

## 2022-10-09 DIAGNOSIS — M25562 Pain in left knee: Secondary | ICD-10-CM | POA: Diagnosis not present

## 2022-10-09 DIAGNOSIS — R262 Difficulty in walking, not elsewhere classified: Secondary | ICD-10-CM

## 2022-10-09 DIAGNOSIS — M25662 Stiffness of left knee, not elsewhere classified: Secondary | ICD-10-CM

## 2022-10-09 NOTE — Therapy (Signed)
OUTPATIENT PHYSICAL THERAPY TREATMENT NOTE    Patient Name: Desiree [name] MRN: [id number] DOB:02-22-1951, 71 y.o., female Today's Date: 10/09/2022  END OF SESSION:   PT End of Session - 10/09/22 1434     Visit Number 13    Number of Visits 24    Date for PT Re-Evaluation 11/16/22    Authorization Type additional humana visits request at #13 on 10/09/22    Authorization Time Period 10/17-1/12    Authorization - Visit Number 12    Authorization - Number of Visits 12    Progress Note Due on Visit 20    PT Start Time 1430    PT Stop Time 1510    PT Time Calculation (min) 40 min    Activity Tolerance Patient tolerated treatment well    Behavior During Therapy [facility] for tasks assessed/performed              Past Medical History:  Diagnosis Date   Acute gastritis without bleeding 08/08/2018   Anemia    Anxiety    Asthma    Bipolar disorder ([place])    CANDIDIASIS, ESOPHAGEAL 07/28/2009   Qualifier: Diagnosis of  By: [name] MD, [name]    Chronic kidney disease    CKD III   Complication of anesthesia    was told she stopped breathing for one of her finger surgeries   COPD (chronic obstructive pulmonary disease) ([place])    Depression    Diabetes mellitus without complication (HCC)    no meds   Dyspnea    At rest,and with activity   FH: colonic polyps    Fractured elbow    right    GERD (gastroesophageal reflux disease)    Headache    migraines   HH (hiatus hernia)    History of carpal tunnel syndrome    History of chest pain    History of transfusion of packed red blood cells    Hyperlipidemia    Hypertension    Mitral valve prolapse    Neuroleptic-induced tardive dyskinesia    Osteoarthritis of more than one site    Pneumonia    Seasonal allergies    Stroke [facility])    Past Surgical History:  Procedure Laterality Date   AMPUTATION Left 10/14/2018   Procedure: AMPUTATION LEFT LONG FINGER TIP;  Surgeon: Dayna Barker, MD;  Location: [place];  Service: Plastics;   Laterality: Left;   ANTERIOR CERVICAL DECOMP/DISCECTOMY FUSION  09/27/2016   C5-6 anterior cervical discectomy and fusion, allograft and plate/notes 09/27/2016   ANTERIOR CERVICAL DECOMP/DISCECTOMY FUSION N/A 09/27/2016   Procedure: C5-6 Anterior Cervical Discectomy and Fusion, Allograft and Plate;  Surgeon: [name], MD;  Location: [place];  Service: Orthopedics;  Laterality: N/A;   Back Fusion  2002   BIOPSY  07/19/2018   Procedure: BIOPSY;  Surgeon: [name], MD;  Location: [facility] ENDOSCOPY;  Service: Endoscopy;;   BIOPSY  02/13/2019   Procedure: BIOPSY;  Surgeon: [name], MD;  Location: [facility] ENDOSCOPY;  Service: Endoscopy;;   CARPAL TUNNEL RELEASE  yates   left   COLONOSCOPY N/A 01/05/2014   Procedure: COLONOSCOPY;  Surgeon: [name], MD;  Location: [facility] ENDOSCOPY;  Service: Endoscopy;  Laterality: N/A;   ELBOW SURGERY     age 58   ENTEROSCOPY N/A 02/13/2019   Procedure: ENTEROSCOPY;  Surgeon: [name], MD;  Location: [facility] ENDOSCOPY;  Service: Endoscopy;  Laterality: N/A;   ESOPHAGOGASTRODUODENOSCOPY (EGD) WITH PROPOFOL N/A 07/19/2018   Procedure: ESOPHAGOGASTRODUODENOSCOPY (EGD) WITH PROPOFOL;  Surgeon: [name], MD;  Location: [place];  Service: Endoscopy;  Laterality: N/A;   EXTERNAL EAR SURGERY Left    EYE SURGERY     "removed white dots under eyelid"   FINGER SURGERY Left    INJECTION KNEE Right 07/21/2022   Procedure: INTRA-ARTICULAR INJECTION UNDER ANESTHESIA;  Surgeon: [name], MD;  Location: [place];  Service: Orthopedics;  Laterality: Right;   Juvara osteomy     KNEE SURGERY     NOSE SURGERY     Rt. toe bunion     skin, shave biopsy  05/03/2016   Left occipital scalp, top of scalp   TONSILLECTOMY     TOTAL HIP ARTHROPLASTY Right 12/17/2020   TOTAL HIP ARTHROPLASTY Right 12/17/2020   Procedure: TOTAL HIP ARTHROPLASTY-DIRECT ANTERIOR;  Surgeon: [name], MD;  Location: [place];  Service: Orthopedics;  Laterality: Right;  needs RNFA   TOTAL KNEE  ARTHROPLASTY Left 07/21/2022   Procedure: LEFT TOTAL KNEE ARTHROPLASTY;  Surgeon: [name], MD;  Location: [place];  Service: Orthopedics;  Laterality: Left;   UPPER EXTREMITY ANGIOGRAPHY Bilateral 10/11/2018   Procedure: UPPER EXTREMITY ANGIOGRAPHY;  Surgeon: [name], MD;  Location: [place] CV LAB;  Service: Cardiovascular;  Laterality: Bilateral;   Patient Active Problem List   Diagnosis Date Noted   History of total knee arthroplasty, left 08/04/2022   Hypertrophic cardiomyopathy ([place]) 07/10/2022   Insomnia secondary to chronic pain 12/27/2020   Insomnia due to other mental disorder 12/27/2020   Snoring 12/27/2020   Inadequate sleep hygiene 12/27/2020   S/P total right hip arthroplasty 12/18/2020   Arthritis of right hip 12/17/2020   Encounter for preoperative assessment 12/10/2020   Infection of nail bed of finger of right hand 09/15/2020   Pain in right finger(s) 08/31/2020   Ganglion of right wrist 08/31/2020   COPD mixed type ([place]) 12/26/2018   Healthcare maintenance 12/26/2018   History of fall 11/05/2018   Weight loss 11/05/2018   Weakness 11/05/2018   Gangrene of finger ([place]) 10/09/2018   Raynaud's phenomenon with gangrene ([place]) 10/09/2018   LVH (left ventricular hypertrophy) 10/09/2018   Vasospasm ([place]) 09/06/2018   Hypotension 08/08/2018   Leucocytosis 08/08/2018   Elevated troponin I level 08/08/2018   Nausea and vomiting 08/06/2018   Pneumonia 07/12/2018   COPD exacerbation ([place]) 07/11/2018   Primary osteoarthritis of both feet 12/21/2017   DDD (degenerative disc disease), lumbar 12/21/2017   Primary osteoarthritis of both knees 12/21/2017   History of bilateral carpal tunnel release 12/21/2017   DDD (degenerative disc disease), cervical 11/15/2017   Former smoker 11/15/2017   Bronchiectasis without complication ([place]) 32/20/2542   Impingement syndrome of right shoulder 07/25/2017   Hx of fusion of cervical spine 07/25/2017   Cervical spinal  stenosis 09/27/2016   Polypharmacy 06/23/2015   Hyperlipidemia 04/19/2015   Visit for preventive health examination 01/01/2015   Primary osteoarthritis involving multiple joints 01/01/2015   Bipolar affective disorder, currently depressed, moderate ([place])    Bipolar I disorder with mania ([place]) 08/17/2014   Hypertension 10/21/2013   Dizziness 03/25/2013   Medication withdrawal ([place]) 03/05/2013   Diabetes mellitus with renal manifestations, controlled ([place]) 11/10/2012   Alopecia areata 02/06/2012   Obesity (BMI 30-39.9) 02/06/2012   Renal insufficiency 07/16/2011   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    Exertional dyspnea 02/07/2011   Dyspnea on exertion 01/19/2011   DM (diabetes mellitus), type 2 ([place]) 04/22/2010   Carpal tunnel syndrome 02/02/2010   CONSTIPATION 02/02/2010  Primary osteoarthritis of both hands 02/02/2010   CATARACTS 04/13/2009   PAIN IN JOINT, ANKLE AND FOOT 09/16/2008   Eosinophilia 07/21/2008   AFFECTIVE DISORDER 04/21/2008   BACK PAIN, CHRONIC 02/12/2008   LEG PAIN 02/12/2008   ABNORMAL INVOLUNTARY MOVEMENTS 12/04/2007   POSTURAL LIGHTHEADEDNESS 11/04/2007   COLONIC POLYPS, HX OF 11/04/2007   Essential hypertension 06/17/2007   HYPERLIPIDEMIA 04/08/2007   GERD 04/08/2007   LOW BACK PAIN SYNDROME 04/08/2007   CHEST PAIN, RECURRENT 04/08/2007     THERAPY DIAG:  Acute pain of left knee  Difficulty in walking, not elsewhere classified  Stiffness of left knee, not elsewhere classified  Localized edema  Muscle weakness (generalized)  PCP: [name], MD   REFERRING PROVIDER: [name] MD   REFERRING DIAG: M17.12 (ICD-10-CM) - Unilateral primary osteoarthritis, left knee   EVAL THERAPY DIAG:  Acute pain of left knee   Stiffness of left knee, not elsewhere classified   Difficulty in walking, not elsewhere classified   Localized edema   Muscle weakness (generalized)   Rationale for Evaluation and Treatment Rehabilitation   ONSET  DATE: 07/21/22   SUBJECTIVE:    SUBJECTIVE STATEMENT:  She says some knee stiffness and 4-5/10 pain overall. She has been working on her ROM   PERTINENT HISTORY: Lt TKA 07/21/22 PMH: COPD,lumbar DDD and fusion L4-5 2002,,cerv fusion 2017, Rt THA, DM   PAIN:  NPRS scale: 7/10 Pain location: left knee Pain description: sharp pain when "it draws up and sticks", aching right now  Aggravating factors: nighttime, tightness Relieving factors: pain meds, ice, elevation   PRECAUTIONS: None   WEIGHT BEARING RESTRICTIONS No   FALLS:  Has patient fallen in last 6 months? No   LIVING ENVIRONMENT: Lives with: lives with their family Lives in: House/apartment Stairs: No Has following equipment at home: Gilford Rile - 4 wheeled, RW, st cane, grab bars in bathroom. Raised toilet seat,  Pt stating her shower chair doesn't  fit properly.    OCCUPATION: retired   PLOF: Independent   PATIENT GOALS walk without walker, stop hurting     OBJECTIVE:     PATIENT SURVEYS:  10/02/22: FOTO 60% (visit 10) 08/31/2022 FOTO 47 (was risk adjusted 39, Goal 55) 08/08/22: FOTO intake: 51%  predicted:  55%   COGNITION: 08/08/22 Overall cognitive status: WFL               SENSATION: 08/08/22: WFL   EDEMA:  08/08/22 Rt knee circumferential: 37 centimeters                 Left knee circumferential: 45 centimeters   MUSCLE LENGTH: Hamstrings: Right 50 deg; Left 68 deg     POSTURE: rounded shoulders and forward head   PALPATION: TTP: lateral joint line and around incision site, pt also    LOWER EXTREMITY STRENGTH:   MMT Right 08/08/22 Left 08/08/22 R/L 09/28/2022 Left 10/09/22  Hip flexion 5/5 4/5 4+ 4+  Hip extension        Hip abduction        Hip adduction        Hip internal rotation        Hip external rotation        Knee flexion 5/5 3/5 5/3+ 4+  Knee extension 5/5 3/5 5/3 4  Ankle dorsiflexion 5/5 5/5 5/5   Ankle plantarflexion 5/5 5/5 5/5   Ankle inversion        Ankle  eversion         (  Blank rows = not tested)   LOWER EXTREMITY ROM:   ROM A=active P=passive Right 08/08/22 supine Left 08/08/22 supine Left 08/09/22 Supine Left 08/16/22 Left 08/22/22 supine Left 08/29/22 supine Left 08/31/2022 Left 09/13/2022 Left 09/22/22 Left 09/26/22 Left 10/02/22 Left 10/09/22  Hip flexion                Hip extension                Knee flexion A: 128   A: 70 P: 80   A: 77 P: 85 P: 95 A: 85 P: 94 A:90 Active supine 92 AROM in supine heel slide 95 PROM sitting 93, AROM 88 sitting  P: 100 A: 90 Supine  P: 98  Sitting P:110  Knee extension A: 0   A: -8 P: -5 A: -2 edema limited A: -12 (seated LAQ) A:-10 P: -8 A: -8 Active supine -2 Seated AROM LAQ : -4  Active quad set supine: -2  A: -2 A: -2    (Blank rows = not tested)      GAIT: 09/13/2022 : independent ambulation into clinic c decreased stance, lacking full TKE in stance on Lt leg  08/08/2022 Distance walked: 50 feet  Assistive device utilized: Walker - 4 wheeled Level of assistance: Modified independence Comments: antalgic gait pattern     TODAY'S TREATMENT: 10/09/22:  TherEx:  Scifit bike: seat at 6, L2 x 10 minute 6 inch step ups fwd X 10 with one UE support, lateral X 10 with 2 UE support UE supported squats x 10 Leg Press: bil 81# 3 x 10, left leg only 31# 2X10 Standing on Airex: 1/2 tandem stance x 30 sec each LE placed forward with finger tap UE support Seated LAQ left leg 5# 2X10 Seated knee flexion stretch AAROM 5 sec X 10 Seated SLR 2X10 Manual:  Seated Lt knee flexion c distraction/IR mobilization c movement. Lt knee extension PROM with overpressure and manual hamstring stretching   10/04/22:  TherEx:  Scifit bike: seat at 8 L2 x 10 minutes Lunging froward on 6 inch step x 10 c left LE holding 5 sec stretch TRX squats x 10 Leg Press: bil 81# 3 x 10 Leg Press: left LE: 25# 2 x 10 Standing on Airex: feet together x 1 min with finger tap support Standing on  Airex: 1/2 tandem stance x 30 sec each LE placed forward with finger tap UE support Seated knee flexion stretch AAROM 5 sec X 10 Supine SLR: 2 x 10 Manual:  Seated Lt knee flexion c distraction/IR mobilization c movement. Lt knee extension PROM with overpressure and manual hamstring stretching    PATIENT EDUCATION:  Education details: HEP, POC Person educated: Patient Education method: Explanation, Demonstration, Verbal cues, and Handouts Education comprehension: verbalized understanding, returned demonstration, and verbal cues required    HOME EXERCISE PROGRAM: Access Code: [email or web address] URL: [email or web address] Date: 08/08/2022 Prepared by: Kearney Hard   Exercises - Supine Quad Set  - 3-4 x daily - 7 x weekly - 2 sets - 10 reps - Seated Long Arc Quad  - 3-4 x daily - 7 x weekly - 2 sets - 10 reps - 3-5 seconds hold - Sit to Stand with Counter Support  - 3-4 x daily - 7 x weekly - 2 sets - 10 reps - Seated Hamstring Stretch  - 3-4 x daily - 7 x weekly - 3 reps - 30 seconds hold - Supine Heel Slide with Strap  - 3-4 x daily -  7 x weekly - 10 reps - 5-10 seconds hold   ASSESSMENT:   CLINICAL IMPRESSION: PT submitted humana re-authorization request as she had her initial 12 authorized visits. PT recommending up to another 11 visits in next 3-4 weeks to continue to progress her left knee flexion ROM, functional strength and gait as tolerated due to continued deficits in these areas. She has met some of her long term PT goals but not all, see below for details.    OBJECTIVE IMPAIRMENTS Abnormal gait, decreased balance, decreased mobility, difficulty walking, decreased ROM, decreased strength, increased edema, and pain.    ACTIVITY LIMITATIONS bending, sitting, standing, sleeping, stairs, transfers, bed mobility, toileting, and dressing   PARTICIPATION LIMITATIONS: cleaning, laundry, shopping, and community activity   PERSONAL FACTORS 3+ comorbidities: see above  are  also affecting patient's functional outcome.    REHAB POTENTIAL: Good   CLINICAL DECISION MAKING: Stable/uncomplicated   EVALUATION COMPLEXITY: Low     GOALS: Goals reviewed with patient? Yes   Short term PT Goals (target date for Short term goals are 3 weeks 09/01/22) Patient will demonstrate independent use of home exercise program to maintain progress from in clinic treatments. Goal status: MET 08/29/22   Pt will be able to improve her left knee flexion to >/= 90 degrees actively Goal Status: MET 08/29/22   Long term PT goals (target dates for all long term goals are 12 weeks  11/16/22 )   1. Patient will demonstrate/report pain at worst less than or equal to 2/10 to facilitate minimal limitation in daily activity secondary to pain symptoms. Goal status: On Going 10/09/2022   2. Patient will demonstrate independent use of home exercise program to facilitate ability to maintain/progress functional gains from skilled physical therapy services. MET 10/09/22   3. Patient will demonstrate FOTO outcome > or = 55 % to indicate reduced disability due to condition. Goal status: On Going 10/09/2022   4.  Patient will demonstrate left LE MMT 5/5 throughout to faciltiate usual transfers, stairs, squatting at [facility] for daily life.    Goal status: On Going 10/09/2022   5.  Pt will improve her 5 time sit to stand to </= 15 seconds c/s UE support.    Goal status: On Going 12/18/22023   6.  Pt will be able to navigate curb step with/without assistive device safely.   Goal status: MET 10/09/22    7. Pt will improve her left knee flexion to >/= 115 degrees for improved functional mobility and safely.                        A. Goal Status: On Going, now up to 110 10/09/2022       PLAN:   PT FREQUENCY: 2x/week   PT DURATION: 12 weeks   PLANNED INTERVENTIONS: Therapeutic exercises, Therapeutic activity, Neuro Muscular re-education, Balance training, Gait training, Patient/Family  education, Joint mobilization, Stair training, DME instructions, Dry Needling, Electrical stimulation, Traction, Cryotherapy, Moist heat, Taping, Ultrasound, Ionotophoresis 57m/ml Dexamethasone, and Manual therapy.  All included unless contraindicated     PLAN FOR NEXT SESSION:  Left knee strength and ROM focus  BElsie Ra PT, DPT 10/09/22 3:04 PM   Referring diagnosis? M17.12 (ICD-10-CM) - Unilateral primary osteoarthritis, left knee Treatment diagnosis? (if different than referring diagnosis) M25.562, M25.662 ,R26,2, R60.0, M62.81 What was this (referring dx) caused by? _0  Surgery _1  Fall _2  Ongoing issue _3  Arthritis _4  Other: ____________   Laterality: _5  Rt _6  Lt _7   Both   Check all possible CPT codes:                   *CHOOSE 10 OR LESS*                                _0  97110 (Therapeutic Exercise)                   _1  92507 (SLP Treatment)      _2  31594 (Neuro Re-ed)                                _3  92526 (Swallowing Treatment)                  _4  58592 (Gait Training)                                _5  92446 (Cognitive Training, 1st 15 minutes) _6  97140 (Manual Therapy)                           _7  97130 (Cognitive Training, each add'l 15 minutes)         _8  97164 (Re-evaluation)                              _9  Other, List CPT Code ____________  _10  28638 (Therapeutic Activities)                                            _11  17711 (Self Care)               _12  All codes above (97110 - 97535)             _13  97012 (Mechanical Traction)             _14  97014 (E-stim Unattended)             _15  97032 (E-stim manual)             _16  97033 (Ionto)             _17  97035 (Ultrasound) _18  97750 (Physical Performance Training) _19  65790 (Aquatic Therapy) _20  97016 (Vasopneumatic Device) _21  38333 (Paraffin) _22  83291 (Contrast Bath) _23  97597 (Wound Care 1st 20 sq cm) _24  97598 (Wound Care each add'l 20 sq cm) _25  97760 (Orthotic Fabrication, Fitting, Training Initial) _26   [id number] (Prosthetic Management and Training Initial) _27  [id number] (Orthotic or Prosthetic Training/ Modification Subsequent)

## 2022-10-11 ENCOUNTER — Encounter: Payer: Self-pay | Admitting: Rheumatology

## 2022-10-11 ENCOUNTER — Ambulatory Visit: Payer: Medicare HMO | Attending: Rheumatology | Admitting: Rheumatology

## 2022-10-11 ENCOUNTER — Encounter: Payer: Medicare HMO | Admitting: Internal Medicine

## 2022-10-11 ENCOUNTER — Encounter: Payer: Self-pay | Admitting: Physical Therapy

## 2022-10-11 ENCOUNTER — Ambulatory Visit (INDEPENDENT_AMBULATORY_CARE_PROVIDER_SITE_OTHER): Payer: Medicare HMO | Admitting: Physical Therapy

## 2022-10-11 VITALS — BP 112/71 | HR 102 | Ht 62.0 in | Wt 155.2 lb

## 2022-10-11 DIAGNOSIS — R600 Localized edema: Secondary | ICD-10-CM | POA: Diagnosis not present

## 2022-10-11 DIAGNOSIS — J479 Bronchiectasis, uncomplicated: Secondary | ICD-10-CM

## 2022-10-11 DIAGNOSIS — Z79899 Other long term (current) drug therapy: Secondary | ICD-10-CM

## 2022-10-11 DIAGNOSIS — M51369 Other intervertebral disc degeneration, lumbar region without mention of lumbar back pain or lower extremity pain: Secondary | ICD-10-CM

## 2022-10-11 DIAGNOSIS — M19072 Primary osteoarthritis, left ankle and foot: Secondary | ICD-10-CM

## 2022-10-11 DIAGNOSIS — N1832 Chronic kidney disease, stage 3b: Secondary | ICD-10-CM

## 2022-10-11 DIAGNOSIS — M25562 Pain in left knee: Secondary | ICD-10-CM | POA: Diagnosis not present

## 2022-10-11 DIAGNOSIS — M19071 Primary osteoarthritis, right ankle and foot: Secondary | ICD-10-CM

## 2022-10-11 DIAGNOSIS — I10 Essential (primary) hypertension: Secondary | ICD-10-CM

## 2022-10-11 DIAGNOSIS — R262 Difficulty in walking, not elsewhere classified: Secondary | ICD-10-CM | POA: Diagnosis not present

## 2022-10-11 DIAGNOSIS — M6281 Muscle weakness (generalized): Secondary | ICD-10-CM

## 2022-10-11 DIAGNOSIS — Z96652 Presence of left artificial knee joint: Secondary | ICD-10-CM | POA: Diagnosis not present

## 2022-10-11 DIAGNOSIS — M170 Bilateral primary osteoarthritis of knee: Secondary | ICD-10-CM

## 2022-10-11 DIAGNOSIS — M19041 Primary osteoarthritis, right hand: Secondary | ICD-10-CM

## 2022-10-11 DIAGNOSIS — Z8659 Personal history of other mental and behavioral disorders: Secondary | ICD-10-CM

## 2022-10-11 DIAGNOSIS — M5030 Other cervical disc degeneration, unspecified cervical region: Secondary | ICD-10-CM

## 2022-10-11 DIAGNOSIS — M25662 Stiffness of left knee, not elsewhere classified: Secondary | ICD-10-CM

## 2022-10-11 DIAGNOSIS — M0579 Rheumatoid arthritis with rheumatoid factor of multiple sites without organ or systems involvement: Secondary | ICD-10-CM | POA: Diagnosis not present

## 2022-10-11 DIAGNOSIS — Z96641 Presence of right artificial hip joint: Secondary | ICD-10-CM | POA: Diagnosis not present

## 2022-10-11 DIAGNOSIS — M19042 Primary osteoarthritis, left hand: Secondary | ICD-10-CM

## 2022-10-11 DIAGNOSIS — E782 Mixed hyperlipidemia: Secondary | ICD-10-CM

## 2022-10-11 DIAGNOSIS — Z8639 Personal history of other endocrine, nutritional and metabolic disease: Secondary | ICD-10-CM

## 2022-10-11 DIAGNOSIS — I7301 Raynaud's syndrome with gangrene: Secondary | ICD-10-CM

## 2022-10-11 DIAGNOSIS — M7541 Impingement syndrome of right shoulder: Secondary | ICD-10-CM

## 2022-10-11 DIAGNOSIS — Z87891 Personal history of nicotine dependence: Secondary | ICD-10-CM

## 2022-10-11 DIAGNOSIS — M5136 Other intervertebral disc degeneration, lumbar region: Secondary | ICD-10-CM

## 2022-10-11 NOTE — Patient Instructions (Signed)
Standing Labs We placed an order today for your standing lab work.   Please have your standing labs drawn in March  Please have your labs drawn 2 weeks prior to your appointment so that the provider can discuss your lab results at your appointment.  Please note that you may see your imaging and lab results in MyChart before we have reviewed them. We will contact you once all results are reviewed. Please allow our office up to 72 hours to thoroughly review all of the results before contacting the office for clarification of your results.  Lab hours are:   Monday through Thursday from 8:00 am -12:30 pm and 1:00 pm-5:00 pm and Friday from 8:00 am-12:00 pm.  Please be advised, all patients with office appointments requiring lab work will take precedent over walk-in lab work.   Labs are drawn by [facility]. Please bring your co-pay at the time of your lab draw.  You may receive a bill from [facility] for your lab work.  Please note if you are on Hydroxychloroquine and and an order has been placed for a Hydroxychloroquine level, you will need to have it drawn 4 hours or more after your last dose.  If you wish to have your labs drawn at another location, please call the office 24 hours in advance so we can fax the orders.  The office is located at [address], [address], [place], [place] [address] No appointment is necessary.    If you have any questions regarding directions or hours of operation,  please call [phone].   As a reminder, please drink plenty of water prior to coming for your lab work. Thanks!   Vaccines You are taking a medication(s) that can suppress your immune system.  The following immunizations are recommended: Flu annually Covid-19  Td/Tdap (tetanus, diphtheria, pertussis) every 10 years Pneumonia (Prevnar 15 then Pneumovax 23 at least 1 year apart.  Alternatively, can take Prevnar 20 without needing additional dose) Shingrix: 2 doses from 4 weeks to 6 months  apart  Please check with your PCP to make sure you are up to date.  

## 2022-10-11 NOTE — Therapy (Signed)
OUTPATIENT PHYSICAL THERAPY TREATMENT NOTE    Patient Name: Desiree [name] MRN: [id number] DOB:[birth year], 71 y.o., female Today's Date: 10/11/2022  END OF SESSION:   PT End of Session - 10/11/22 1346     Visit Number 14    Number of Visits 24    Date for PT Re-Evaluation 11/16/22    Authorization Type additional [employer] visits request at #13 on 10/09/22    Authorization - Visit Number 13    Authorization - Number of Visits 24    Progress Note Due on Visit 20    PT Start Time 1347    PT Stop Time 1430    PT Time Calculation (min) 43 min    Activity Tolerance Patient tolerated treatment well    Behavior During Therapy [facility] for tasks assessed/performed               Past Medical History:  Diagnosis Date   Acute gastritis without bleeding 08/08/2018   Anemia    Anxiety    Asthma    Bipolar disorder ([place])    CANDIDIASIS, ESOPHAGEAL 07/28/2009   Qualifier: Diagnosis of  By: [name] MD, [name]    Chronic kidney disease    CKD III   Complication of anesthesia    was told she stopped breathing for one of her finger surgeries   COPD (chronic obstructive pulmonary disease) ([place])    Depression    Diabetes mellitus without complication (HCC)    no meds   Dyspnea    At rest,and with activity   FH: colonic polyps    Fractured elbow    right    GERD (gastroesophageal reflux disease)    Headache    migraines   HH (hiatus hernia)    History of carpal tunnel syndrome    History of chest pain    History of transfusion of packed red blood cells    Hyperlipidemia    Hypertension    Mitral valve prolapse    Neuroleptic-induced tardive dyskinesia    Osteoarthritis of more than one site    Pneumonia    Seasonal allergies    Stroke [facility])    Past Surgical History:  Procedure Laterality Date   AMPUTATION Left 10/14/2018   Procedure: AMPUTATION LEFT LONG FINGER TIP;  Surgeon: Dayna Barker, MD;  Location: [place];  Service: Plastics;  Laterality: Left;   ANTERIOR CERVICAL  DECOMP/DISCECTOMY FUSION  09/27/2016   C5-6 anterior cervical discectomy and fusion, allograft and plate/notes 09/27/2016   ANTERIOR CERVICAL DECOMP/DISCECTOMY FUSION N/A 09/27/2016   Procedure: C5-6 Anterior Cervical Discectomy and Fusion, Allograft and Plate;  Surgeon: [name], MD;  Location: [place];  Service: Orthopedics;  Laterality: N/A;   Back Fusion  2002   BIOPSY  07/19/2018   Procedure: BIOPSY;  Surgeon: [name], MD;  Location: [facility] ENDOSCOPY;  Service: Endoscopy;;   BIOPSY  02/13/2019   Procedure: BIOPSY;  Surgeon: [name], MD;  Location: [facility] ENDOSCOPY;  Service: Endoscopy;;   CARPAL TUNNEL RELEASE  yates   left   COLONOSCOPY N/A 01/05/2014   Procedure: COLONOSCOPY;  Surgeon: [name], MD;  Location: [facility] ENDOSCOPY;  Service: Endoscopy;  Laterality: N/A;   ELBOW SURGERY     age 50   ENTEROSCOPY N/A 02/13/2019   Procedure: ENTEROSCOPY;  Surgeon: [name], MD;  Location: [facility] ENDOSCOPY;  Service: Endoscopy;  Laterality: N/A;   ESOPHAGOGASTRODUODENOSCOPY (EGD) WITH PROPOFOL N/A 07/19/2018   Procedure: ESOPHAGOGASTRODUODENOSCOPY (EGD) WITH PROPOFOL;  Surgeon: [name], MD;  Location:  [place] ENDOSCOPY;  Service: Endoscopy;  Laterality: N/A;   EXTERNAL EAR SURGERY Left    EYE SURGERY     "removed white dots under eyelid"   FINGER SURGERY Left    INJECTION KNEE Right 07/21/2022   Procedure: INTRA-ARTICULAR INJECTION UNDER ANESTHESIA;  Surgeon: [name], MD;  Location: [place];  Service: Orthopedics;  Laterality: Right;   Juvara osteomy     KNEE SURGERY     NOSE SURGERY     Rt. toe bunion     skin, shave biopsy  05/03/2016   Left occipital scalp, top of scalp   TONSILLECTOMY     TOTAL HIP ARTHROPLASTY Right 12/17/2020   TOTAL HIP ARTHROPLASTY Right 12/17/2020   Procedure: TOTAL HIP ARTHROPLASTY-DIRECT ANTERIOR;  Surgeon: [name], MD;  Location: [place];  Service: Orthopedics;  Laterality: Right;  needs RNFA   TOTAL KNEE ARTHROPLASTY Left 07/21/2022   Procedure:  LEFT TOTAL KNEE ARTHROPLASTY;  Surgeon: [name], MD;  Location: [place];  Service: Orthopedics;  Laterality: Left;   UPPER EXTREMITY ANGIOGRAPHY Bilateral 10/11/2018   Procedure: UPPER EXTREMITY ANGIOGRAPHY;  Surgeon: [name], MD;  Location: [place] CV LAB;  Service: Cardiovascular;  Laterality: Bilateral;   Patient Active Problem List   Diagnosis Date Noted   History of total knee arthroplasty, left 08/04/2022   Hypertrophic cardiomyopathy ([place]) 07/10/2022   Insomnia secondary to chronic pain 12/27/2020   Insomnia due to other mental disorder 12/27/2020   Snoring 12/27/2020   Inadequate sleep hygiene 12/27/2020   S/P total right hip arthroplasty 12/18/2020   Arthritis of right hip 12/17/2020   Encounter for preoperative assessment 12/10/2020   Infection of nail bed of finger of right hand 09/15/2020   Pain in right finger(s) 08/31/2020   Ganglion of right wrist 08/31/2020   COPD mixed type ([place]) 12/26/2018   Healthcare maintenance 12/26/2018   History of fall 11/05/2018   Weight loss 11/05/2018   Weakness 11/05/2018   Gangrene of finger ([place]) 10/09/2018   Raynaud's phenomenon with gangrene ([place]) 10/09/2018   LVH (left ventricular hypertrophy) 10/09/2018   Vasospasm ([place]) 09/06/2018   Hypotension 08/08/2018   Leucocytosis 08/08/2018   Elevated troponin I level 08/08/2018   Nausea and vomiting 08/06/2018   Pneumonia 07/12/2018   COPD exacerbation ([place]) 07/11/2018   Primary osteoarthritis of both feet 12/21/2017   DDD (degenerative disc disease), lumbar 12/21/2017   Primary osteoarthritis of both knees 12/21/2017   History of bilateral carpal tunnel release 12/21/2017   DDD (degenerative disc disease), cervical 11/15/2017   Former smoker 11/15/2017   Bronchiectasis without complication ([place]) 63/10/6008   Impingement syndrome of right shoulder 07/25/2017   Hx of fusion of cervical spine 07/25/2017   Cervical spinal stenosis 09/27/2016   Polypharmacy  06/23/2015   Hyperlipidemia 04/19/2015   Visit for preventive health examination 01/01/2015   Primary osteoarthritis involving multiple joints 01/01/2015   Bipolar affective disorder, currently depressed, moderate ([place])    Bipolar I disorder with mania ([place]) 08/17/2014   Hypertension 10/21/2013   Dizziness 03/25/2013   Medication withdrawal ([place]) 03/05/2013   Diabetes mellitus with renal manifestations, controlled ([place]) 11/10/2012   Alopecia areata 02/06/2012   Obesity (BMI 30-39.9) 02/06/2012   Renal insufficiency 07/16/2011   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    Exertional dyspnea 02/07/2011   Dyspnea on exertion 01/19/2011   DM (diabetes mellitus), type 2 ([place]) 04/22/2010   Carpal tunnel syndrome 02/02/2010   CONSTIPATION 02/02/2010   Primary osteoarthritis of both  hands 02/02/2010   CATARACTS 04/13/2009   PAIN IN JOINT, ANKLE AND FOOT 09/16/2008   Eosinophilia 07/21/2008   AFFECTIVE DISORDER 04/21/2008   BACK PAIN, CHRONIC 02/12/2008   LEG PAIN 02/12/2008   ABNORMAL INVOLUNTARY MOVEMENTS 12/04/2007   POSTURAL LIGHTHEADEDNESS 11/04/2007   COLONIC POLYPS, HX OF 11/04/2007   Essential hypertension 06/17/2007   HYPERLIPIDEMIA 04/08/2007   GERD 04/08/2007   LOW BACK PAIN SYNDROME 04/08/2007   CHEST PAIN, RECURRENT 04/08/2007     THERAPY DIAG:  Acute pain of left knee  Difficulty in walking, not elsewhere classified  Stiffness of left knee, not elsewhere classified  Localized edema  Muscle weakness (generalized)  PCP: [name], MD   REFERRING PROVIDER: [name] MD   REFERRING DIAG: M17.12 (ICD-10-CM) - Unilateral primary osteoarthritis, left knee   EVAL THERAPY DIAG:  Acute pain of left knee   Stiffness of left knee, not elsewhere classified   Difficulty in walking, not elsewhere classified   Localized edema   Muscle weakness (generalized)   Rationale for Evaluation and Treatment Rehabilitation   ONSET DATE: 07/21/22   SUBJECTIVE:     SUBJECTIVE STATEMENT: Pt states her knee still cramps at night but pain has improved.    PERTINENT HISTORY: Lt TKA 07/21/22 PMH: COPD,lumbar DDD and fusion L4-5 2002,,cerv fusion 2017, Rt THA, DM   PAIN:  NPRS scale: 3 or 4/10; hurts worst with bending Pain location: left knee Pain description: sharp pain when "it draws up and sticks", aching right now  Aggravating factors: nighttime, tightness Relieving factors: pain meds, ice, elevation   PRECAUTIONS: None   WEIGHT BEARING RESTRICTIONS No   FALLS:  Has patient fallen in last 6 months? No   LIVING ENVIRONMENT: Lives with: lives with their family Lives in: House/apartment Stairs: No Has following equipment at home: Gilford Rile - 4 wheeled, RW, st cane, grab bars in bathroom. Raised toilet seat,  Pt stating her shower chair doesn't  fit properly.    OCCUPATION: retired   PLOF: Independent   PATIENT GOALS walk without walker, stop hurting     OBJECTIVE:     PATIENT SURVEYS:  10/02/22: FOTO 60% (visit 10) 08/31/2022 FOTO 47 (was risk adjusted 39, Goal 55) 08/08/22: FOTO intake: 51%  predicted:  55%   COGNITION: 08/08/22 Overall cognitive status: WFL               SENSATION: 08/08/22: WFL   EDEMA:  08/08/22 Rt knee circumferential: 37 centimeters                 Left knee circumferential: 45 centimeters   MUSCLE LENGTH: Hamstrings: Right 50 deg; Left 68 deg     POSTURE: rounded shoulders and forward head   PALPATION: TTP: lateral joint line and around incision site, pt also    LOWER EXTREMITY STRENGTH:   MMT Right 08/08/22 Left 08/08/22 R/L 09/28/2022 Left 10/09/22  Hip flexion 5/5 4/5 4+ 4+  Hip extension        Hip abduction        Hip adduction        Hip internal rotation        Hip external rotation        Knee flexion 5/5 3/5 5/3+ 4+  Knee extension 5/5 3/5 5/3 4  Ankle dorsiflexion 5/5 5/5 5/5   Ankle plantarflexion 5/5 5/5 5/5   Ankle inversion        Ankle eversion         (Blank rows =  not tested)   LOWER EXTREMITY ROM:   ROM A=active P=passive Right 08/08/22 supine Left 08/08/22 supine Left 08/09/22 Supine Left 08/16/22 Left 08/22/22 supine Left 08/29/22 supine Left 08/31/2022 Left 09/13/2022 Left 09/22/22 Left 09/26/22 Left 10/02/22 Left 10/09/22 Left 10/11/22  Hip flexion                 Hip extension                 Knee flexion A: 128   A: 70 P: 80   A: 77 P: 85 P: 95 A: 85 P: 94 A:90 Active supine 92 AROM in supine heel slide 95 PROM sitting 93, AROM 88 sitting  P: 100 A: 90 Supine  P: 98  Sitting P:110 Sitting P: 112  Knee extension A: 0   A: -8 P: -5 A: -2 edema limited A: -12 (seated LAQ) A:-10 P: -8 A: -8 Active supine -2 Seated AROM LAQ : -4  Active quad set supine: -2  A: -2 A: -2     (Blank rows = not tested)      GAIT:  09/13/2022 : independent ambulation into clinic c decreased stance, lacking full TKE in stance on Lt leg  08/08/2022 Distance walked: 50 feet  Assistive device utilized: Walker - 4 wheeled Level of assistance: Modified independence Comments: antalgic gait pattern     TODAY'S TREATMENT: 10/11/22: TherEx:  Scifit bike: seat at 6, L2 x 6 minute Supine hamstring stretch with strap 2x30 sec Supine quad set with heel on bolster 2x10 with overpressure Supine quad set + SLR 2x10 Sit<>stand x10 with focus on TKE Seated knee flexion 2x30 sec Runner's step up 6" step 2x10 with bilat UE support Side step up 6" step 2x10 Manual: PROM knee flex and ext with overpressure Scar massage Gait Forwards walking x30' focus on heel strike, knee extension and toe off without circumduction Backwards walking x 30' focus on knee extension and increasing L step length    10/09/22:  TherEx:  Scifit bike: seat at 6, L2 x 10 minute 6 inch step ups fwd X 10 with one UE support, lateral X 10 with 2 UE support UE supported squats x 10 Leg Press: bil 81# 3 x 10, left leg only 31# 2X10 Standing on Airex: 1/2 tandem stance x  30 sec each LE placed forward with finger tap UE support Seated LAQ left leg 5# 2X10 Seated knee flexion stretch AAROM 5 sec X 10 Seated SLR 2X10 Manual:  Seated Lt knee flexion c distraction/IR mobilization c movement. Lt knee extension PROM with overpressure and manual hamstring stretching    PATIENT EDUCATION:  Education details: Scar massage Person educated: Patient Education method: Explanation, Demonstration, Verbal cues, and Handouts Education comprehension: verbalized understanding, returned demonstration, and verbal cues required    HOME EXERCISE PROGRAM: Access Code: [email or web address] URL: [email or web address] Date: 08/08/2022 Prepared by: Kearney Hard   Exercises - Supine Quad Set  - 3-4 x daily - 7 x weekly - 2 sets - 10 reps - Seated Long Arc Quad  - 3-4 x daily - 7 x weekly - 2 sets - 10 reps - 3-5 seconds hold - Sit to Stand with Counter Support  - 3-4 x daily - 7 x weekly - 2 sets - 10 reps - Seated Hamstring Stretch  - 3-4 x daily - 7 x weekly - 3 reps - 30 seconds hold - Supine Heel Slide with Strap  - 3-4 x daily - 7 x weekly - 10  reps - 5-10 seconds hold   ASSESSMENT:   CLINICAL IMPRESSION: Session focused on improving knee ROM and strength. Pt with increasing knee flexion noted. Working on improving L LE weightbearing -- pt still tries to shift weight to the right with sit to stands. Encouraged pt to place more weight on L LE and to perform scar massage to improve tissue extensibility.    OBJECTIVE IMPAIRMENTS Abnormal gait, decreased balance, decreased mobility, difficulty walking, decreased ROM, decreased strength, increased edema, and pain.    ACTIVITY LIMITATIONS bending, sitting, standing, sleeping, stairs, transfers, bed mobility, toileting, and dressing   PARTICIPATION LIMITATIONS: cleaning, laundry, shopping, and community activity   PERSONAL FACTORS 3+ comorbidities: see above  are also affecting patient's functional outcome.    REHAB  POTENTIAL: Good   CLINICAL DECISION MAKING: Stable/uncomplicated   EVALUATION COMPLEXITY: Low     GOALS: Goals reviewed with patient? Yes   Short term PT Goals (target date for Short term goals are 3 weeks 09/01/22) Patient will demonstrate independent use of home exercise program to maintain progress from in clinic treatments. Goal status: MET 08/29/22   Pt will be able to improve her left knee flexion to >/= 90 degrees actively Goal Status: MET 08/29/22   Long term PT goals (target dates for all long term goals are 12 weeks  11/16/22 )   1. Patient will demonstrate/report pain at worst less than or equal to 2/10 to facilitate minimal limitation in daily activity secondary to pain symptoms. Goal status: On Going 10/09/2022   2. Patient will demonstrate independent use of home exercise program to facilitate ability to maintain/progress functional gains from skilled physical therapy services. MET 10/09/22   3. Patient will demonstrate FOTO outcome > or = 55 % to indicate reduced disability due to condition. Goal status: On Going 10/09/2022   4.  Patient will demonstrate left LE MMT 5/5 throughout to faciltiate usual transfers, stairs, squatting at [facility] for daily life.    Goal status: On Going 10/09/2022   5.  Pt will improve her 5 time sit to stand to </= 15 seconds c/s UE support.    Goal status: On Going 12/18/22023   6.  Pt will be able to navigate curb step with/without assistive device safely.   Goal status: MET 10/09/22    7. Pt will improve her left knee flexion to >/= 115 degrees for improved functional mobility and safely.                        A. Goal Status: On Going, now up to 110 10/09/2022       PLAN:   PT FREQUENCY: 2x/week   PT DURATION: 12 weeks   PLANNED INTERVENTIONS: Therapeutic exercises, Therapeutic activity, Neuro Muscular re-education, Balance training, Gait training, Patient/Family education, Joint mobilization, Stair training, DME instructions,  Dry Needling, Electrical stimulation, Traction, Cryotherapy, Moist heat, Taping, Ultrasound, Ionotophoresis 65m/ml Dexamethasone, and Manual therapy.  All included unless contraindicated     PLAN FOR NEXT SESSION:  Left knee strength and ROM focus  Osmani Kersten April MGordy Levan PT 10/11/22 2:32 PM   Referring diagnosis? M17.12 (ICD-10-CM) - Unilateral primary osteoarthritis, left knee Treatment diagnosis? (if different than referring diagnosis) M25.562, M25.662 ,R26,2, R60.0, M62.81 What was this (referring dx) caused by? _0  Surgery _1  Fall _2  Ongoing issue _3  Arthritis _4  Other: ____________   Laterality: _5  Rt _6  Lt _7  Both   Check all possible CPT codes:                   *  CHOOSE 10 OR LESS*                                _0  97110 (Therapeutic Exercise)                   _1  92507 (SLP Treatment)      _2  95320 (Neuro Re-ed)                                _3  92526 (Swallowing Treatment)                  _4  23343 (Gait Training)                                _5  56861 (Cognitive Training, 1st 15 minutes) _6  97140 (Manual Therapy)                           _7  97130 (Cognitive Training, each add'l 15 minutes)         _8  97164 (Re-evaluation)                              _9  Other, List CPT Code ____________  _10  68372 (Therapeutic Activities)                                            _11  90211 (Self Care)               _12  All codes above (97110 - 97535)             _13  97012 (Mechanical Traction)             _14  97014 (E-stim Unattended)             _15  97032 (E-stim manual)             _16  97033 (Ionto)             _17  97035 (Ultrasound) _18  97750 (Physical Performance Training) _19  15520 (Aquatic Therapy) _20  97016 (Vasopneumatic Device) _21  80223 (Paraffin) _22  36122 (Contrast Bath) _23  97597 (Wound Care 1st 20 sq cm) _24  97598 (Wound Care each add'l 20 sq cm) _25  97760 (Orthotic Fabrication, Fitting, Training Initial) _26  [id number] (Prosthetic Management and Training Initial) _27   [id number] (Orthotic or Prosthetic Training/ Modification Subsequent)

## 2022-10-12 ENCOUNTER — Ambulatory Visit (INDEPENDENT_AMBULATORY_CARE_PROVIDER_SITE_OTHER): Payer: Medicare HMO | Admitting: Family

## 2022-10-12 VITALS — BP 120/78 | HR 80 | Temp 98.1°F | Wt 155.0 lb

## 2022-10-12 DIAGNOSIS — I10 Essential (primary) hypertension: Secondary | ICD-10-CM | POA: Diagnosis not present

## 2022-10-12 DIAGNOSIS — E1169 Type 2 diabetes mellitus with other specified complication: Secondary | ICD-10-CM | POA: Diagnosis not present

## 2022-10-12 DIAGNOSIS — N183 Chronic kidney disease, stage 3 unspecified: Secondary | ICD-10-CM

## 2022-10-12 DIAGNOSIS — E785 Hyperlipidemia, unspecified: Secondary | ICD-10-CM | POA: Diagnosis not present

## 2022-10-12 DIAGNOSIS — Z0000 Encounter for general adult medical examination without abnormal findings: Secondary | ICD-10-CM

## 2022-10-12 NOTE — Patient Instructions (Signed)

## 2022-10-13 ENCOUNTER — Ambulatory Visit (INDEPENDENT_AMBULATORY_CARE_PROVIDER_SITE_OTHER): Payer: Medicare HMO | Admitting: Orthopaedic Surgery

## 2022-10-13 ENCOUNTER — Encounter: Payer: Self-pay | Admitting: Orthopaedic Surgery

## 2022-10-13 VITALS — BP 122/71 | HR 92 | Ht 62.0 in | Wt 155.0 lb

## 2022-10-13 DIAGNOSIS — Z96652 Presence of left artificial knee joint: Secondary | ICD-10-CM | POA: Diagnosis not present

## 2022-10-13 MED ORDER — METAXALONE 800 MG PO TABS: 800.0000 mg | ORAL_TABLET | Freq: Three times a day (TID) | ORAL | 0 refills | Status: DC

## 2022-10-13 NOTE — Progress Notes (Unsigned)
Office Visit Note   Patient: Desiree [name]           Date of Birth: 1951-05-26           MRN: [id number] Visit Date: 10/13/2022              Requested by: [name], MD [place],  [place] [id number] PCP: [name], MD   Assessment & Plan: Visit Diagnoses: No diagnosis found.  Plan: ***  Follow-Up Instructions: Return in about 6 weeks (around 11/24/2022).   Orders:  No orders of the defined types were placed in this encounter.  No orders of the defined types were placed in this encounter.     Procedures: No procedures performed   Clinical Data: No additional findings.   Subjective: Chief Complaint  Patient presents with   Left Knee - Follow-up, Routine Post Op    07/21/2022 Left TKA    HPI  Review of Systems   Objective: Vital Signs: BP 122/71   Pulse 92   Ht 5\' 2"  (1.575 m)   Wt 155 lb (70.3 kg)   BMI 28.35 kg/m   Physical Exam  Ortho Exam  Specialty Comments:  No specialty comments available.  Imaging: No results found.   PMFS History: Patient Active Problem List   Diagnosis Date Noted   History of total knee arthroplasty, left 08/04/2022   Hypertrophic cardiomyopathy ([place]) 07/10/2022   Insomnia secondary to chronic pain 12/27/2020   Insomnia due to other mental disorder 12/27/2020   Snoring 12/27/2020   Inadequate sleep hygiene 12/27/2020   S/P total right hip arthroplasty 12/18/2020   Arthritis of right hip 12/17/2020   Encounter for preoperative assessment 12/10/2020   Infection of nail bed of finger of right hand 09/15/2020   Pain in right finger(s) 08/31/2020   Ganglion of right wrist 08/31/2020   COPD mixed type ([place]) 12/26/2018   Healthcare maintenance 12/26/2018   History of fall 11/05/2018   Weight loss 11/05/2018   Weakness 11/05/2018   Gangrene of finger ([place]) 10/09/2018   Raynaud's phenomenon with gangrene ([place]) 10/09/2018   LVH (left ventricular hypertrophy) 10/09/2018   Vasospasm ([place])  09/06/2018   Hypotension 08/08/2018   Leucocytosis 08/08/2018   Elevated troponin I level 08/08/2018   Nausea and vomiting 08/06/2018   Pneumonia 07/12/2018   COPD exacerbation ([place]) 07/11/2018   Primary osteoarthritis of both feet 12/21/2017   DDD (degenerative disc disease), lumbar 12/21/2017   Primary osteoarthritis of both knees 12/21/2017   History of bilateral carpal tunnel release 12/21/2017   DDD (degenerative disc disease), cervical 11/15/2017   Former smoker 11/15/2017   Bronchiectasis without complication ([place]) [id number]   Impingement syndrome of right shoulder 07/25/2017   Hx of fusion of cervical spine 07/25/2017   Cervical spinal stenosis 09/27/2016   Polypharmacy 06/23/2015   Hyperlipidemia 04/19/2015   Visit for preventive health examination 01/01/2015   Primary osteoarthritis involving multiple joints 01/01/2015   Bipolar affective disorder, currently depressed, moderate ([place])    Bipolar I disorder with mania ([place]) 08/17/2014   Hypertension 10/21/2013   Dizziness 03/25/2013   Medication withdrawal ([place]) 03/05/2013   Diabetes mellitus with renal manifestations, controlled ([place]) 11/10/2012   Alopecia areata 02/06/2012   Obesity (BMI 30-39.9) 02/06/2012   Renal insufficiency 07/16/2011   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    Exertional dyspnea 02/07/2011   Dyspnea on exertion 01/19/2011   DM (diabetes mellitus), type 2 ([place]) 04/22/2010   Carpal tunnel syndrome 02/02/2010  CONSTIPATION 02/02/2010   Primary osteoarthritis of both hands 02/02/2010   CATARACTS 04/13/2009   PAIN IN JOINT, ANKLE AND FOOT 09/16/2008   Eosinophilia 07/21/2008   AFFECTIVE DISORDER 04/21/2008   BACK PAIN, CHRONIC 02/12/2008   LEG PAIN 02/12/2008   ABNORMAL INVOLUNTARY MOVEMENTS 12/04/2007   POSTURAL LIGHTHEADEDNESS 11/04/2007   COLONIC POLYPS, HX OF 11/04/2007   Essential hypertension 06/17/2007   HYPERLIPIDEMIA 04/08/2007   GERD 04/08/2007   LOW BACK PAIN SYNDROME 04/08/2007    CHEST PAIN, RECURRENT 04/08/2007   Past Medical History:  Diagnosis Date   Acute gastritis without bleeding 08/08/2018   Anemia    Anxiety    Asthma    Bipolar disorder ([place])    CANDIDIASIS, ESOPHAGEAL 07/28/2009   Qualifier: Diagnosis of  By: [name] MD, [name]    Chronic kidney disease    CKD III   Complication of anesthesia    was told she stopped breathing for one of her finger surgeries   COPD (chronic obstructive pulmonary disease) (HCC)    Depression    Diabetes mellitus without complication (HCC)    no meds   Dyspnea    At rest,and with activity   FH: colonic polyps    Fractured elbow    right    GERD (gastroesophageal reflux disease)    Headache    migraines   HH (hiatus hernia)    History of carpal tunnel syndrome    History of chest pain    History of transfusion of packed red blood cells    Hyperlipidemia    Hypertension    Mitral valve prolapse    Neuroleptic-induced tardive dyskinesia    Osteoarthritis of more than one site    Pneumonia    Seasonal allergies    Stroke [facility])     Family History  Problem Relation Age of Onset   Diabetes Mother    Hypertension Mother    Heart attack Mother    Heart attack Father    Heart disease Father    Throat cancer Brother    Diabetes type II Brother    Heart disease Brother    Lung cancer Brother    Lung cancer Paternal Uncle    Lung cancer Daughter    Breast cancer Cousin     Past Surgical History:  Procedure Laterality Date   AMPUTATION Left 10/14/2018   Procedure: AMPUTATION LEFT LONG FINGER TIP;  Surgeon: Dayna Barker, MD;  Location: [place];  Service: Plastics;  Laterality: Left;   ANTERIOR CERVICAL DECOMP/DISCECTOMY FUSION  09/27/2016   C5-6 anterior cervical discectomy and fusion, allograft and plate/notes 09/27/2016   ANTERIOR CERVICAL DECOMP/DISCECTOMY FUSION N/A 09/27/2016   Procedure: C5-6 Anterior Cervical Discectomy and Fusion, Allograft and Plate;  Surgeon: [name], MD;  Location: [place];   Service: Orthopedics;  Laterality: N/A;   Back Fusion  2002   BIOPSY  07/19/2018   Procedure: BIOPSY;  Surgeon: [name], MD;  Location[facility] ENDOSCOPY;  Service: Endoscopy;;   BIOPSY  02/13/2019   Procedure: BIOPSY;  Surgeon: [name], MD;  Location: [facility] ENDOSCOPY;  Service: Endoscopy;;   CARPAL TUNNEL RELEASE  Kane Kusek   left   COLONOSCOPY N/A 01/05/2014   Procedure: COLONOSCOPY;  Surgeon: [name], MD;  Location: [facility] ENDOSCOPY;  Service: Endoscopy;  Laterality: N/A;   ELBOW SURGERY     age 71   ENTEROSCOPY N/A 02/13/2019   Procedure: ENTEROSCOPY;  Surgeon: [name], MD;  Location: [facility] ENDOSCOPY;  Service: Endoscopy;  Laterality: N/A;  ESOPHAGOGASTRODUODENOSCOPY (EGD) WITH PROPOFOL N/A 07/19/2018   Procedure: ESOPHAGOGASTRODUODENOSCOPY (EGD) WITH PROPOFOL;  Surgeon: Jeani Hawking, MD;  Location: [facility] ENDOSCOPY;  Service: Endoscopy;  Laterality: N/A;   EXTERNAL EAR SURGERY Left    EYE SURGERY     "removed white dots under eyelid"   FINGER SURGERY Left    INJECTION KNEE Right 07/21/2022   Procedure: INTRA-ARTICULAR INJECTION UNDER ANESTHESIA;  Surgeon: Eldred Manges, MD;  Location: [facility] OR;  Service: Orthopedics;  Laterality: Right;   Juvara osteomy     KNEE SURGERY     NOSE SURGERY     Rt. toe bunion     skin, shave biopsy  05/03/2016   Left occipital scalp, top of scalp   TONSILLECTOMY     TOTAL HIP ARTHROPLASTY Right 12/17/2020   TOTAL HIP ARTHROPLASTY Right 12/17/2020   Procedure: TOTAL HIP ARTHROPLASTY-DIRECT ANTERIOR;  Surgeon: Eldred Manges, MD;  Location: [facility] OR;  Service: Orthopedics;  Laterality: Right;  needs RNFA   TOTAL KNEE ARTHROPLASTY Left 07/21/2022   Procedure: LEFT TOTAL KNEE ARTHROPLASTY;  Surgeon: Eldred Manges, MD;  Location: [facility] OR;  Service: Orthopedics;  Laterality: Left;   UPPER EXTREMITY ANGIOGRAPHY Bilateral 10/11/2018   Procedure: UPPER EXTREMITY ANGIOGRAPHY;  Surgeon: Sherren Kerns, MD;  Location: [facility] INVASIVE CV LAB;  Service: Cardiovascular;   Laterality: Bilateral;   Social History   Occupational History   Not on file  Tobacco Use   Smoking status: Former    Packs/day: 2.00    Years: 30.00    Total pack years: 60.00    Types: Cigarettes    Quit date: 10/23/2001    Years since quitting: 20.9    Passive exposure: Past   Smokeless tobacco: Never  Vaping Use   Vaping Use: Never used  Substance and Sexual Activity   Alcohol use: No   Drug use: No   Sexual activity: Not on file

## 2022-10-16 NOTE — Progress Notes (Signed)
Complete physical exam  Patient: Desiree [name]   DOB: 1951-03-29   71 y.o. Female  MRN: [id number]  Subjective:    Chief Complaint  Patient presents with  . Annual Exam    Desiree [name] is a 71 y.o. female who presents today for a complete physical exam. She reports consuming a general diet. The patient does not participate in regular exercise at present. She generally feels {DESC; WELL/FAIRLY WELL/POORLY:18703}. She reports sleeping {DESC; WELL/FAIRLY WELL/POORLY:18703}. She {does/does not:200015} have additional problems to discuss today.    Most recent fall risk assessment:    10/12/2022    3:31 PM  [place] in the past year? 1  Number falls in past yr: 1  Injury with Fall? 0  Risk for fall due to : History of fall(s)  Follow up Falls evaluation completed     Most recent depression screenings:    10/12/2022    3:42 PM 08/31/2022   10:50 AM  PHQ 2/9 Scores  PHQ - 2 Score 2 0  PHQ- 9 Score 7     {VISON DENTAL STD PSA (Optional):27386}  {History (Optional):23778}  Patient Care Team: [name], [name], MD as PCP - General (Internal Medicine) Josue Hector, MD as PCP - Cardiology (Cardiology) [name], MD (Orthopedic Surgery) Cottle, Billey Co., MD as Attending Physician (Psychiatry) [name], MD as Consulting Physician (Gastroenterology) Elmarie Shiley, MD as Consulting Physician (Nephrology) Druscilla Brownie, MD as Consulting Physician (Dermatology) Arvella Nigh, MD as Consulting Physician (Obstetrics and Gynecology) Gean Birchwood, DPM (Inactive) as Consulting Physician (Podiatry) [name], MD as Consulting Physician (Pulmonary Disease) Bo Merino, MD as Consulting Physician (Rheumatology) Nicholas Lose, MD as Consulting Physician (Hematology and Oncology) Alen Blew as Physician Assistant (Psychiatry) Julian Hy, DO as Consulting Physician (Pulmonary Disease) [name], MD as Consulting Physician  (Orthopedic Surgery) Viona Gilmore, [facility] as Pharmacist (Pharmacist) [name], MD as Consulting Physician (Orthopedic Surgery)   Outpatient Medications Prior to Visit  Medication Sig  . Accu-Chek Softclix Lancets lancets Use to test blood sugar daily. Dx:e11.9  . albuterol (VENTOLIN HFA) 108 (90 Base) MCG/ACT inhaler Inhale 2 puffs into the lungs every 6 (six) hours as needed for wheezing or shortness of breath.  . Alcohol Swabs (DROPSAFE ALCOHOL PREP) 70 % PADS USE AS DIRECTED  . amLODipine (NORVASC) 5 MG tablet TAKE 1 TABLET EVERY MORNING  . aspirin EC 325 MG tablet Take 1 tablet (325 mg total) by mouth daily. MUST TAKE AT LEAST 4 WEEKS POSTOP FOR DVT PROPHYLAXIS  . azelastine (ASTELIN) 0.1 % nasal spray USE 2 SPRAYS IN EACH NOSTRIL TWICE DAILY AS DIRECTED  . Blood Glucose Monitoring Suppl (TRUE METRIX AIR GLUCOSE METER) w/Device KIT Use as directed DX:11.9  . Budeson-Glycopyrrol-Formoterol (BREZTRI AEROSPHERE) 160-9-4.8 MCG/ACT AERO Inhale 2 puffs into the lungs in the morning and at bedtime.  . Carboxymethylcellulose Sodium (THERATEARS) 0.25 % SOLN Place 1 drop into both eyes daily as needed (Dry eyes).  . cetirizine (ZYRTEC) 10 MG tablet Take 10 mg by mouth at bedtime.   . diclofenac sodium (VOLTAREN) 1 % GEL APPLY 2-4 GRAMS TO AFFECTED JOINTS UP TO FOUR TIMES DAILY  . docusate sodium (DULCOLAX) 100 MG capsule Take 100 mg by mouth as needed for mild constipation or moderate constipation.  . famotidine (PEPCID) 40 MG tablet TAKE 1 TABLET EVERY DAY  . feeding supplement, ENSURE ENLIVE, (ENSURE ENLIVE) LIQD Take 237 mLs by mouth  3 (three) times daily between meals. (Patient taking differently: Take 237 mLs by mouth 2 (two) times daily between meals. vanilla)  . fluticasone (FLONASE) 50 MCG/ACT nasal spray Place 2 sprays into both nostrils at bedtime.  Marland Kitchen glucose blood (TRUE METRIX BLOOD GLUCOSE TEST) test strip USE AS INSTRUCTED  . hydroxychloroquine (PLAQUENIL) 200 MG tablet TAKE 1  TABLET EVERY DAY  . methocarbamol (ROBAXIN) 500 MG tablet Take 1 tablet (500 mg total) by mouth every 8 (eight) hours as needed for muscle spasms. (Patient not taking: Reported on 10/13/2022)  . montelukast (SINGULAIR) 10 MG tablet TAKE 1 TABLET AT BEDTIME  . Multiple Vitamins-Minerals (CENTRUM SILVER 50+WOMEN) TABS Take 1 tablet by mouth daily.  Marland Kitchen neomycin-bacitracin-polymyxin (NEOSPORIN) [phone] ointment Apply 1 Application topically daily as needed (on fingers).  . ondansetron (ZOFRAN-ODT) 4 MG disintegrating tablet DISSOLVE 1 TABLET ON THE TONGUE EVERY 8 HOURS AS NEEDED FOR NAUSEA OR VOMITING  . pravastatin (PRAVACHOL) 40 MG tablet TAKE 1 TABLET EVERY DAY  . Respiratory Therapy Supplies (FLUTTER) DEVI 1 Device by Does not apply route as directed.  . traMADol (ULTRAM) 50 MG tablet Take 1 tablet (50 mg total) by mouth every 6 (six) hours as needed. (Patient not taking: Reported on 10/13/2022)  . TRUEplus Lancets 33G MISC USE AS DIRECTED  . Povidone, PF, 0.5 % SOLN Apply to eye as needed for dry eyes. (Patient not taking: Reported on 10/12/2022)   Facility-Administered Medications Prior to Visit  Medication Dose Route Frequency Provider  . diclofenac Sodium (VOLTAREN) 1 % topical gel 4 g  4 g Topical QID [name], MD    ROS        Objective:     BP 120/78 (BP Location: Left Arm, Patient Position: Sitting, Cuff Size: Normal)   Pulse 80   Temp 98.1 F (36.7 C) (Oral)   Wt 155 lb (70.3 kg)   SpO2 97%   BMI 28.35 kg/m  {Vitals History (Optional):23777}  Physical Exam   No results found for any visits on 10/12/22. {Show previous labs (optional):23779}    Assessment & Plan:    Routine Health Maintenance and Physical Exam  Immunization History  Administered Date(s) Administered  . Fluad Quad(high Dose 65+) 07/09/2019, 07/09/2020, 07/11/2021, 07/13/2022  . H1N1 11/13/2008  . Influenza Whole 07/21/2008, 07/28/2009, 06/28/2010, 07/05/2011, 09/22/2012  . Influenza,  High Dose Seasonal PF 07/05/2017, 07/14/2018  . Influenza,inj,Quad PF,6+ Mos 07/11/2013, 07/03/2014, 06/23/2015, 06/29/2016  . PFIZER Comirnaty(Gray Top)Covid-19 Tri-Sucrose Vaccine 02/02/2021  . PFIZER(Purple Top)SARS-COV-2 Vaccination 11/17/2019, 12/08/2019, 08/04/2020, 08/08/2021, 08/19/2022  . PNEUMOCOCCAL CONJUGATE-20 08/23/2022  . Pneumococcal Conjugate-13 12/19/2013  . Pneumococcal Polysaccharide-23 02/25/2009, 04/19/2015  . Rsv, Bivalent, Protein Subunit Rsvpref,pf Evans Lance) 07/13/2022  . Td 02/02/2010  . Tdap 04/25/2016  . Zoster Recombinat (Shingrix) 12/11/2021, 02/07/2022    Health Maintenance  Topic Date Due  . FOOT EXAM  07/05/2018  . COVID-19 Vaccine (7 - 2023-24 season) 10/14/2022  . MAMMOGRAM  03/04/2023 (Originally 02/21/2022)  . DEXA SCAN  03/04/2023 (Originally 04/11/2017)  . HEMOGLOBIN A1C  11/30/2022  . Medicare Annual Wellness (AWV)  03/04/2023  . Diabetic kidney evaluation - Urine ACR  05/31/2023  . OPHTHALMOLOGY EXAM  08/11/2023  . Diabetic kidney evaluation - eGFR measurement  08/17/2023  . COLONOSCOPY (Pts 45-45yr Insurance coverage will need to be confirmed)  01/06/2024  . DTaP/Tdap/Td (3 - Td or Tdap) 04/25/2026  . Pneumonia Vaccine 70 Years old  Completed  . INFLUENZA VACCINE  Completed  . Hepatitis C Screening  Completed  .  Zoster Vaccines- Shingrix  Completed  . HPV VACCINES  Aged Out    Discussed health benefits of physical activity, and encouraged her to engage in regular exercise appropriate for her age and condition.  Problem List Items Addressed This Visit     Hyperlipidemia   Relevant Orders   CBC with Differential   Comprehensive metabolic panel   Hemoglobin A1c   Lipid panel   TSH   Essential hypertension   Relevant Orders   CBC with Differential   Comprehensive metabolic panel   Hemoglobin A1c   Lipid panel   TSH   DM (diabetes mellitus), type 2 (HCC)   Relevant Orders   CBC with Differential   Comprehensive metabolic panel    Hemoglobin A1c   Lipid panel   TSH   Other Visit Diagnoses     Routine general medical examination at a health care facility    -  Primary   Relevant Orders   CBC with Differential   Comprehensive metabolic panel   Hemoglobin A1c   Lipid panel   TSH   Stage 3 chronic kidney disease, unspecified whether stage 3a or 3b CKD (HCC)       Relevant Orders   CBC with Differential   Comprehensive metabolic panel   Hemoglobin A1c   Lipid panel   TSH      Return in 6 months (on 04/13/2023).     Kennyth Arnold, FNP

## 2022-10-17 ENCOUNTER — Encounter: Payer: Medicare HMO | Admitting: Internal Medicine

## 2022-10-18 ENCOUNTER — Other Ambulatory Visit: Payer: Self-pay | Admitting: Physician Assistant

## 2022-10-18 ENCOUNTER — Telehealth: Payer: Self-pay | Admitting: *Deleted

## 2022-10-18 MED ORDER — CYCLOBENZAPRINE HCL 10 MG PO TABS: 10.0000 mg | ORAL_TABLET | Freq: Three times a day (TID) | ORAL | 0 refills | Status: DC | PRN

## 2022-10-18 NOTE — Telephone Encounter (Signed)
Dr. Ophelia Charter saw this patient on Friday, 10/13/22 in office and changed muscle relaxer to Skelaxin. She hasn't been able to get b/c pharmacy is stating insurance won't cover. Even with Good Rx coupon, still too much OOP. She is 3 months s/p left TKR. Knee feels like it's catching. She has used Robaxin up until this point, but is now out of everything. Anything else we can see that would work and insurance cover? Thanks.

## 2022-10-18 NOTE — Telephone Encounter (Signed)
noted 

## 2022-10-18 NOTE — Telephone Encounter (Signed)
Thanks! I will let her know.

## 2022-10-18 NOTE — Telephone Encounter (Signed)
I sent in flexeril to try

## 2022-10-24 ENCOUNTER — Encounter: Payer: Self-pay | Admitting: Physical Therapy

## 2022-10-24 ENCOUNTER — Ambulatory Visit (INDEPENDENT_AMBULATORY_CARE_PROVIDER_SITE_OTHER): Payer: Medicare HMO | Admitting: Physical Therapy

## 2022-10-24 DIAGNOSIS — M25662 Stiffness of left knee, not elsewhere classified: Secondary | ICD-10-CM | POA: Diagnosis not present

## 2022-10-24 DIAGNOSIS — M6281 Muscle weakness (generalized): Secondary | ICD-10-CM | POA: Diagnosis not present

## 2022-10-24 DIAGNOSIS — R600 Localized edema: Secondary | ICD-10-CM

## 2022-10-24 DIAGNOSIS — F319 Bipolar disorder, unspecified: Secondary | ICD-10-CM | POA: Diagnosis not present

## 2022-10-24 DIAGNOSIS — M25562 Pain in left knee: Secondary | ICD-10-CM

## 2022-10-24 DIAGNOSIS — R262 Difficulty in walking, not elsewhere classified: Secondary | ICD-10-CM | POA: Diagnosis not present

## 2022-10-24 NOTE — Therapy (Signed)
OUTPATIENT PHYSICAL THERAPY TREATMENT NOTE    Patient Name: Desiree [name] MRN: [id number] DOB:08/06/1951, 72 y.o., female Today's Date: 10/24/2022  END OF SESSION:       Past Medical History:  Diagnosis Date   Acute gastritis without bleeding 08/08/2018   Anemia    Anxiety    Asthma    Bipolar disorder ([place])    CANDIDIASIS, ESOPHAGEAL 07/28/2009   Qualifier: Diagnosis of  By: [name] MD, [name]    Chronic kidney disease    CKD III   Complication of anesthesia    was told she stopped breathing for one of her finger surgeries   COPD (chronic obstructive pulmonary disease) ([place])    Depression    Diabetes mellitus without complication (HCC)    no meds   Dyspnea    At rest,and with activity   FH: colonic polyps    Fractured elbow    right    GERD (gastroesophageal reflux disease)    Headache    migraines   HH (hiatus hernia)    History of carpal tunnel syndrome    History of chest pain    History of transfusion of packed red blood cells    Hyperlipidemia    Hypertension    Mitral valve prolapse    Neuroleptic-induced tardive dyskinesia    Osteoarthritis of more than one site    Pneumonia    Seasonal allergies    Stroke [facility])    Past Surgical History:  Procedure Laterality Date   AMPUTATION Left 10/14/2018   Procedure: AMPUTATION LEFT LONG FINGER TIP;  Surgeon: Dayna Barker, MD;  Location: [place];  Service: Plastics;  Laterality: Left;   ANTERIOR CERVICAL DECOMP/DISCECTOMY FUSION  09/27/2016   C5-6 anterior cervical discectomy and fusion, allograft and plate/notes 09/27/2016   ANTERIOR CERVICAL DECOMP/DISCECTOMY FUSION N/A 09/27/2016   Procedure: C5-6 Anterior Cervical Discectomy and Fusion, Allograft and Plate;  Surgeon: [name], MD;  Location: [place];  Service: Orthopedics;  Laterality: N/A;   Back Fusion  2002   BIOPSY  07/19/2018   Procedure: BIOPSY;  Surgeon: [name], MD;  Location: [facility] ENDOSCOPY;  Service: Endoscopy;;   BIOPSY  02/13/2019    Procedure: BIOPSY;  Surgeon: [name], MD;  Location: [facility] ENDOSCOPY;  Service: Endoscopy;;   CARPAL TUNNEL RELEASE  yates   left   COLONOSCOPY N/A 01/05/2014   Procedure: COLONOSCOPY;  Surgeon: [name], MD;  Location: [facility] ENDOSCOPY;  Service: Endoscopy;  Laterality: N/A;   ELBOW SURGERY     age 18   ENTEROSCOPY N/A 02/13/2019   Procedure: ENTEROSCOPY;  Surgeon: [name], MD;  Location: [facility] ENDOSCOPY;  Service: Endoscopy;  Laterality: N/A;   ESOPHAGOGASTRODUODENOSCOPY (EGD) WITH PROPOFOL N/A 07/19/2018   Procedure: ESOPHAGOGASTRODUODENOSCOPY (EGD) WITH PROPOFOL;  Surgeon: [name], MD;  Location: [place];  Service: Endoscopy;  Laterality: N/A;   EXTERNAL EAR SURGERY Left    EYE SURGERY     "removed white dots under eyelid"   FINGER SURGERY Left    INJECTION KNEE Right 07/21/2022   Procedure: INTRA-ARTICULAR INJECTION UNDER ANESTHESIA;  Surgeon: [name], MD;  Location: [address];  Service: Orthopedics;  Laterality: Right;   Juvara osteomy     KNEE SURGERY     NOSE SURGERY     Rt. toe bunion     skin, shave biopsy  05/03/2016   Left occipital scalp, top of scalp   TONSILLECTOMY     TOTAL HIP ARTHROPLASTY Right 12/17/2020   TOTAL HIP ARTHROPLASTY  Right 12/17/2020   Procedure: TOTAL HIP ARTHROPLASTY-DIRECT ANTERIOR;  Surgeon: [name], MD;  Location: [place];  Service: Orthopedics;  Laterality: Right;  needs RNFA   TOTAL KNEE ARTHROPLASTY Left 07/21/2022   Procedure: LEFT TOTAL KNEE ARTHROPLASTY;  Surgeon: [name], MD;  Location: [place];  Service: Orthopedics;  Laterality: Left;   UPPER EXTREMITY ANGIOGRAPHY Bilateral 10/11/2018   Procedure: UPPER EXTREMITY ANGIOGRAPHY;  Surgeon: [name], MD;  Location: [place] [facility];  Service: Cardiovascular;  Laterality: Bilateral;   Patient Active Problem List   Diagnosis Date Noted   History of total knee arthroplasty, left 08/04/2022   Hypertrophic cardiomyopathy ([place]) 07/10/2022   Insomnia secondary to  chronic pain 12/27/2020   Insomnia due to other mental disorder 12/27/2020   Snoring 12/27/2020   Inadequate sleep hygiene 12/27/2020   S/P total right hip arthroplasty 12/18/2020   Arthritis of right hip 12/17/2020   Encounter for preoperative assessment 12/10/2020   Infection of nail bed of finger of right hand 09/15/2020   Pain in right finger(s) 08/31/2020   Ganglion of right wrist 08/31/2020   COPD mixed type ([place]) 12/26/2018   Healthcare maintenance 12/26/2018   History of fall 11/05/2018   Weight loss 11/05/2018   Weakness 11/05/2018   Gangrene of finger ([place]) 10/09/2018   Raynaud's phenomenon with gangrene ([place]) 10/09/2018   LVH (left ventricular hypertrophy) 10/09/2018   Vasospasm ([place]) 09/06/2018   Hypotension 08/08/2018   Leucocytosis 08/08/2018   Elevated troponin I level 08/08/2018   Nausea and vomiting 08/06/2018   Pneumonia 07/12/2018   COPD exacerbation ([place]) 07/11/2018   Primary osteoarthritis of both feet 12/21/2017   DDD (degenerative disc disease), lumbar 12/21/2017   Primary osteoarthritis of both knees 12/21/2017   History of bilateral carpal tunnel release 12/21/2017   DDD (degenerative disc disease), cervical 11/15/2017   Former smoker 11/15/2017   Bronchiectasis without complication ([place]) 09/81/1914   Impingement syndrome of right shoulder 07/25/2017   Hx of fusion of cervical spine 07/25/2017   Cervical spinal stenosis 09/27/2016   Polypharmacy 06/23/2015   Hyperlipidemia 04/19/2015   Visit for preventive health examination 01/01/2015   Primary osteoarthritis involving multiple joints 01/01/2015   Bipolar affective disorder, currently depressed, moderate ([place])    Bipolar I disorder with mania ([place]) 08/17/2014   Hypertension 10/21/2013   Dizziness 03/25/2013   Medication withdrawal ([place]) 03/05/2013   Diabetes mellitus with renal manifestations, controlled ([place]) 11/10/2012   Alopecia areata 02/06/2012   Obesity (BMI 30-39.9) 02/06/2012   Renal  insufficiency 07/16/2011   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    Exertional dyspnea 02/07/2011   Dyspnea on exertion 01/19/2011   DM (diabetes mellitus), type 2 ([place]) 04/22/2010   Carpal tunnel syndrome 02/02/2010   CONSTIPATION 02/02/2010   Primary osteoarthritis of both hands 02/02/2010   CATARACTS 04/13/2009   PAIN IN JOINT, ANKLE AND FOOT 09/16/2008   Eosinophilia 07/21/2008   AFFECTIVE DISORDER 04/21/2008   BACK PAIN, CHRONIC 02/12/2008   LEG PAIN 02/12/2008   ABNORMAL INVOLUNTARY MOVEMENTS 12/04/2007   POSTURAL LIGHTHEADEDNESS 11/04/2007   COLONIC POLYPS, HX OF 11/04/2007   Essential hypertension 06/17/2007   HYPERLIPIDEMIA 04/08/2007   GERD 04/08/2007   LOW BACK PAIN SYNDROME 04/08/2007   CHEST PAIN, RECURRENT 04/08/2007     THERAPY DIAG:  No diagnosis found.  PCP: [name], MD   REFERRING PROVIDER: [name] MD   REFERRING DIAG: [phone] (ICD-10-CM) - Unilateral primary osteoarthritis, left knee   EVAL THERAPY DIAG:  Acute pain of left knee   Stiffness of left knee, not elsewhere classified   Difficulty in walking, not elsewhere classified   Localized edema   Muscle weakness (generalized)   Rationale for Evaluation and Treatment Rehabilitation   ONSET DATE: 07/21/22   SUBJECTIVE:    SUBJECTIVE STATEMENT: Pt still reporting stiffness when waking up at night to go to bathroom.    PERTINENT HISTORY: Lt TKA 07/21/22 PMH: COPD,lumbar DDD and fusion L4-5 2002,,cerv fusion 2017, Rt THA, DM   PAIN:  NPRS scale: 3/10; hurts worst with bending Pain location: left knee Pain description: sharp pain when "it draws up and sticks", aching right now  Aggravating factors: nighttime, tightness Relieving factors: pain meds, ice, elevation   PRECAUTIONS: None   WEIGHT BEARING RESTRICTIONS No   FALLS:  Has patient fallen in last 6 months? No   LIVING ENVIRONMENT: Lives with: lives with their family Lives in: House/apartment Stairs: No Has following  equipment at home: Gilford Rile - 4 wheeled, RW, st cane, grab bars in bathroom. Raised toilet seat,  Pt stating her shower chair doesn't  fit properly.    OCCUPATION: retired   PLOF: Independent   PATIENT GOALS walk without walker, stop hurting     OBJECTIVE:     PATIENT SURVEYS:  10/02/22: FOTO 60% (visit 10) 08/31/2022 FOTO 47 (was risk adjusted 39, Goal 55) 08/08/22: FOTO intake: 51%  predicted:  55%   COGNITION: 08/08/22 Overall cognitive status: WFL               SENSATION: 08/08/22: WFL   EDEMA:  08/08/22 Rt knee circumferential: 37 centimeters                 Left knee circumferential: 45 centimeters   MUSCLE LENGTH: Hamstrings: Right 50 deg; Left 68 deg     POSTURE: rounded shoulders and forward head   PALPATION: TTP: lateral joint line and around incision site, pt also    LOWER EXTREMITY STRENGTH:   MMT Right 08/08/22 Left 08/08/22 R/L 09/28/2022 Left 10/09/22  Hip flexion 5/5 4/5 4+ 4+  Hip extension        Hip abduction        Hip adduction        Hip internal rotation        Hip external rotation        Knee flexion 5/5 3/5 5/3+ 4+  Knee extension 5/5 3/5 5/3 4  Ankle dorsiflexion 5/5 5/5 5/5   Ankle plantarflexion 5/5 5/5 5/5   Ankle inversion        Ankle eversion         (Blank rows = not tested)   LOWER EXTREMITY ROM:   ROM A=active P=passive Right 08/08/22 supine Left 08/08/22 supine Left 08/09/22 Supine Left 08/16/22 Left 08/22/22 supine Left 08/29/22 supine Left 08/31/2022 Left 09/13/2022 Left 09/22/22 Left 09/26/22 Left 10/02/22 Left 10/09/22 Left 10/11/22 Left 10/24/22  Hip flexion                  Hip extension                  Knee flexion A: 128   A: 70 P: 80   A: 77 P: 85 P: 95 A: 85 P: 94 A:90 Active supine 92 AROM in supine heel slide 95 PROM sitting 93, AROM 88 sitting  P: 100 A: 90 Supine  P: 98  Sitting P:110 Sitting P: 112 Supine A: 112  Knee extension A: 0  A: -8 P: -5 A: -2 edema limited A: -12  (seated LAQ) A:-10 P: -8 A: -8 Active supine -2 Seated AROM LAQ : -4  Active quad set supine: -2  A: -2 A: -2      (Blank rows = not tested)      GAIT:  09/13/2022 : independent ambulation into clinic c decreased stance, lacking full TKE in stance on Lt leg  08/08/2022 Distance walked: 50 feet  Assistive device utilized: Walker - 4 wheeled Level of assistance: Modified independence Comments: antalgic gait pattern     TODAY'S TREATMENT: 10/24/22:  TherEx:  Nustep: Level 6 x 6 minutes Standing slant board calf stretch x 2 holding 30 sec Standing heel raises/toe raises x 15 Standing hip abduction x 15 each LE Step ups on 6 inch step x 10 Lateral step ups on 6 inch step x 10 Sit<>stand x10 with focus on TKE Supine SLR: 2 x 10  Neuromuscular Re-edu Side stepping 20 feet x 2 Side stepping over dumbells with bar across the top (set of 2) Manual: PROM knee flex and ext with overpressure    10/11/22: TherEx:  Scifit bike: seat at 6, L2 x 6 minute Supine hamstring stretch with strap 2x30 sec Supine quad set with heel on bolster 2x10 with overpressure Supine quad set + SLR 2x10 Sit<>stand x10 with focus on TKE Seated knee flexion 2x30 sec Runner's step up 6" step 2x10 with bilat UE support Side step up 6" step 2x10 Manual: PROM knee flex and ext with overpressure Scar massage Gait Forwards walking x30' focus on heel strike, knee extension and toe off without circumduction Backwards walking x 30' focus on knee extension and increasing L step length    10/09/22:  TherEx:  Scifit bike: seat at 6, L2 x 10 minute 6 inch step ups fwd X 10 with one UE support, lateral X 10 with 2 UE support UE supported squats x 10 Leg Press: bil 81# 3 x 10, left leg only 31# 2X10 Standing on Airex: 1/2 tandem stance x 30 sec each LE placed forward with finger tap UE support Seated LAQ left leg 5# 2X10 Seated knee flexion stretch AAROM 5 sec X 10 Seated SLR 2X10 Manual:  Seated Lt knee  flexion c distraction/IR mobilization c movement. Lt knee extension PROM with overpressure and manual hamstring stretching    PATIENT EDUCATION:  Education details: Scar massage Person educated: Patient Education method: Explanation, Demonstration, Verbal cues, and Handouts Education comprehension: verbalized understanding, returned demonstration, and verbal cues required    HOME EXERCISE PROGRAM: Access Code: [email or web address] URL: [email or web address] Date: 10/24/2022 Prepared by: Kearney Hard  Exercises - Seated Long Arc Quad  - 3-4 x daily - 7 x weekly - 2 sets - 10 reps - 3-5 seconds hold - Sit to Stand with Counter Support  - 3-4 x daily - 7 x weekly - 2 sets - 10 reps - Seated Hamstring Stretch  - 3-4 x daily - 7 x weekly - 3 reps - 30 seconds hold - Supine Heel Slide with Strap  - 3-4 x daily - 7 x weekly - 5 reps - 10 seconds hold - Heel Raises with Counter Support  - 2-3 x daily - 7 x weekly - 15 reps - Supine Bridge  - 2 x daily - 7 x weekly - 2 sets - 10 reps - 5 seconds hold   ASSESSMENT:   CLINICAL IMPRESSION: Pt still reporting pain when first waking up in the morning and when  getting up in the middle of the night. Pt's HEP was updated. Pt tolerating exercises well. Continue skilled PT.    OBJECTIVE IMPAIRMENTS Abnormal gait, decreased balance, decreased mobility, difficulty walking, decreased ROM, decreased strength, increased edema, and pain.    ACTIVITY LIMITATIONS bending, sitting, standing, sleeping, stairs, transfers, bed mobility, toileting, and dressing   PARTICIPATION LIMITATIONS: cleaning, laundry, shopping, and community activity   PERSONAL FACTORS 3+ comorbidities: see above  are also affecting patient's functional outcome.    REHAB POTENTIAL: Good   CLINICAL DECISION MAKING: Stable/uncomplicated   EVALUATION COMPLEXITY: Low     GOALS: Goals reviewed with patient? Yes   Short term PT Goals (target date for Short term goals are 3  weeks 09/01/22) Patient will demonstrate independent use of home exercise program to maintain progress from in clinic treatments. Goal status: MET 08/29/22   2. Pt will be able to improve her left knee flexion to >/= 90 degrees actively Goal Status: MET 08/29/22   Long term PT goals (target dates for all long term goals are 12 weeks  11/16/22 )   1. Patient will demonstrate/report pain at worst less than or equal to 2/10 to facilitate minimal limitation in daily activity secondary to pain symptoms. Goal status: On Going 10/24/22   2. Patient will demonstrate independent use of home exercise program to facilitate ability to maintain/progress functional gains from skilled physical therapy services. MET 10/09/22   3. Patient will demonstrate FOTO outcome > or = 55 % to indicate reduced disability due to condition. A. Goal status: On Going 10/24/22   4.  Patient will demonstrate left LE MMT 5/5 throughout to faciltiate usual transfers, stairs, squatting at [facility] for daily life.    A. Goal status: On Going 10/24/22  5.  Pt will improve her 5 time sit to stand to </= 15 seconds c/s UE support.    Goal status: On Going 10/24/22   6.  Pt will be able to navigate curb step with/without assistive device safely.   Goal status: MET 10/09/22    7. Pt will improve her left knee flexion to >/= 115 degrees for improved functional mobility and safely.  A. Goal Status: On Going 10/24/22         PLAN:   PT FREQUENCY: 2x/week   PT DURATION: 12 weeks   PLANNED INTERVENTIONS: Therapeutic exercises, Therapeutic activity, Neuro Muscular re-education, Balance training, Gait training, Patient/Family education, Joint mobilization, Stair training, DME instructions, Dry Needling, Electrical stimulation, Traction, Cryotherapy, Moist heat, Taping, Ultrasound, Ionotophoresis 66m/ml Dexamethasone, and Manual therapy.  All included unless contraindicated     PLAN FOR NEXT SESSION:  Left knee strength and ROM  focus  JOretha Caprice PT, MPT 10/24/22 12:27 PM

## 2022-10-26 ENCOUNTER — Ambulatory Visit: Payer: Medicare HMO | Admitting: Physical Therapy

## 2022-10-26 ENCOUNTER — Encounter: Payer: Self-pay | Admitting: Physical Therapy

## 2022-10-26 DIAGNOSIS — M25662 Stiffness of left knee, not elsewhere classified: Secondary | ICD-10-CM

## 2022-10-26 DIAGNOSIS — M6281 Muscle weakness (generalized): Secondary | ICD-10-CM | POA: Diagnosis not present

## 2022-10-26 DIAGNOSIS — R600 Localized edema: Secondary | ICD-10-CM | POA: Diagnosis not present

## 2022-10-26 DIAGNOSIS — M25562 Pain in left knee: Secondary | ICD-10-CM | POA: Diagnosis not present

## 2022-10-26 DIAGNOSIS — R262 Difficulty in walking, not elsewhere classified: Secondary | ICD-10-CM

## 2022-10-26 NOTE — Therapy (Signed)
OUTPATIENT PHYSICAL THERAPY TREATMENT NOTE    Patient Name: Desiree [name] MRN: [id number] DOB:12-17-1950, 72 y.o., female Today's Date: 10/26/2022  END OF SESSION:   PT End of Session - 10/26/22 1514     Visit Number 16    Number of Visits 24    Date for PT Re-Evaluation 11/16/22    Authorization Type additional [employer] visits request at #13 on 10/09/22    Authorization Time Period 10/17-1/12    Authorization - Visit Number 15    Authorization - Number of Visits 24    Progress Note Due on Visit 20    PT Start Time 1514    PT Stop Time 1552    PT Time Calculation (min) 38 min    Activity Tolerance Patient tolerated treatment well    Behavior During Therapy [facility] for tasks assessed/performed                Past Medical History:  Diagnosis Date   Acute gastritis without bleeding 08/08/2018   Anemia    Anxiety    Asthma    Bipolar disorder ([place])    CANDIDIASIS, ESOPHAGEAL 07/28/2009   Qualifier: Diagnosis of  By: [name] MD, [name]    Chronic kidney disease    CKD III   Complication of anesthesia    was told she stopped breathing for one of her finger surgeries   COPD (chronic obstructive pulmonary disease) ([place])    Depression    Diabetes mellitus without complication (HCC)    no meds   Dyspnea    At rest,and with activity   FH: colonic polyps    Fractured elbow    right    GERD (gastroesophageal reflux disease)    Headache    migraines   HH (hiatus hernia)    History of carpal tunnel syndrome    History of chest pain    History of transfusion of packed red blood cells    Hyperlipidemia    Hypertension    Mitral valve prolapse    Neuroleptic-induced tardive dyskinesia    Osteoarthritis of more than one site    Pneumonia    Seasonal allergies    Stroke [facility])    Past Surgical History:  Procedure Laterality Date   AMPUTATION Left 10/14/2018   Procedure: AMPUTATION LEFT LONG FINGER TIP;  Surgeon: Dayna Barker, MD;  Location: [place];  Service: Plastics;   Laterality: Left;   ANTERIOR CERVICAL DECOMP/DISCECTOMY FUSION  09/27/2016   C5-6 anterior cervical discectomy and fusion, allograft and plate/notes 09/27/2016   ANTERIOR CERVICAL DECOMP/DISCECTOMY FUSION N/A 09/27/2016   Procedure: C5-6 Anterior Cervical Discectomy and Fusion, Allograft and Plate;  Surgeon: [name], MD;  Location: [place];  Service: Orthopedics;  Laterality: N/A;   Back Fusion  2002   BIOPSY  07/19/2018   Procedure: BIOPSY;  Surgeon: [name], MD;  Location: [facility] ENDOSCOPY;  Service: Endoscopy;;   BIOPSY  02/13/2019   Procedure: BIOPSY;  Surgeon: [name], MD;  Location: [facility] ENDOSCOPY;  Service: Endoscopy;;   CARPAL TUNNEL RELEASE  yates   left   COLONOSCOPY N/A 01/05/2014   Procedure: COLONOSCOPY;  Surgeon: [name], MD;  Location: [facility] ENDOSCOPY;  Service: Endoscopy;  Laterality: N/A;   ELBOW SURGERY     age 5   ENTEROSCOPY N/A 02/13/2019   Procedure: ENTEROSCOPY;  Surgeon: [name], MD;  Location: [facility] ENDOSCOPY;  Service: Endoscopy;  Laterality: N/A;   ESOPHAGOGASTRODUODENOSCOPY (EGD) WITH PROPOFOL N/A 07/19/2018   Procedure: ESOPHAGOGASTRODUODENOSCOPY (EGD) WITH  PROPOFOL;  Surgeon: [name], MD;  Location: [place];  Service: Endoscopy;  Laterality: N/A;   EXTERNAL EAR SURGERY Left    EYE SURGERY     "removed white dots under eyelid"   FINGER SURGERY Left    INJECTION KNEE Right 07/21/2022   Procedure: INTRA-ARTICULAR INJECTION UNDER ANESTHESIA;  Surgeon: [name], MD;  Location: [place];  Service: Orthopedics;  Laterality: Right;   Juvara osteomy     KNEE SURGERY     NOSE SURGERY     Rt. toe bunion     skin, shave biopsy  05/03/2016   Left occipital scalp, top of scalp   TONSILLECTOMY     TOTAL HIP ARTHROPLASTY Right 12/17/2020   TOTAL HIP ARTHROPLASTY Right 12/17/2020   Procedure: TOTAL HIP ARTHROPLASTY-DIRECT ANTERIOR;  Surgeon: [name], MD;  Location: [place];  Service: Orthopedics;  Laterality: Right;  needs RNFA   TOTAL KNEE  ARTHROPLASTY Left 07/21/2022   Procedure: LEFT TOTAL KNEE ARTHROPLASTY;  Surgeon: [name], MD;  Location: [place];  Service: Orthopedics;  Laterality: Left;   UPPER EXTREMITY ANGIOGRAPHY Bilateral 10/11/2018   Procedure: UPPER EXTREMITY ANGIOGRAPHY;  Surgeon: [name], MD;  Location: [place] [facility];  Service: Cardiovascular;  Laterality: Bilateral;   Patient Active Problem List   Diagnosis Date Noted   History of total knee arthroplasty, left 08/04/2022   Hypertrophic cardiomyopathy ([place]) 07/10/2022   Insomnia secondary to chronic pain 12/27/2020   Insomnia due to other mental disorder 12/27/2020   Snoring 12/27/2020   Inadequate sleep hygiene 12/27/2020   S/P total right hip arthroplasty 12/18/2020   Arthritis of right hip 12/17/2020   Encounter for preoperative assessment 12/10/2020   Infection of nail bed of finger of right hand 09/15/2020   Pain in right finger(s) 08/31/2020   Ganglion of right wrist 08/31/2020   COPD mixed type ([place]) 12/26/2018   Healthcare maintenance 12/26/2018   History of fall 11/05/2018   Weight loss 11/05/2018   Weakness 11/05/2018   Gangrene of finger ([place]) 10/09/2018   Raynaud's phenomenon with gangrene ([place]) 10/09/2018   LVH (left ventricular hypertrophy) 10/09/2018   Vasospasm ([place]) 09/06/2018   Hypotension 08/08/2018   Leucocytosis 08/08/2018   Elevated troponin I level 08/08/2018   Nausea and vomiting 08/06/2018   Pneumonia 07/12/2018   COPD exacerbation ([place]) 07/11/2018   Primary osteoarthritis of both feet 12/21/2017   DDD (degenerative disc disease), lumbar 12/21/2017   Primary osteoarthritis of both knees 12/21/2017   History of bilateral carpal tunnel release 12/21/2017   DDD (degenerative disc disease), cervical 11/15/2017   Former smoker 11/15/2017   Bronchiectasis without complication ([place]) 32/99/2426   Impingement syndrome of right shoulder 07/25/2017   Hx of fusion of cervical spine 07/25/2017   Cervical spinal  stenosis 09/27/2016   Polypharmacy 06/23/2015   Hyperlipidemia 04/19/2015   Visit for preventive health examination 01/01/2015   Primary osteoarthritis involving multiple joints 01/01/2015   Bipolar affective disorder, currently depressed, moderate ([place])    Bipolar I disorder with mania ([place]) 08/17/2014   Hypertension 10/21/2013   Dizziness 03/25/2013   Medication withdrawal ([place]) 03/05/2013   Diabetes mellitus with renal manifestations, controlled ([place]) 11/10/2012   Alopecia areata 02/06/2012   Obesity (BMI 30-39.9) 02/06/2012   Renal insufficiency 07/16/2011   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    Exertional dyspnea 02/07/2011   Dyspnea on exertion 01/19/2011   DM (diabetes mellitus), type 2 ([place]) 04/22/2010   Carpal tunnel syndrome 02/02/2010  CONSTIPATION 02/02/2010   Primary osteoarthritis of both hands 02/02/2010   CATARACTS 04/13/2009   PAIN IN JOINT, ANKLE AND FOOT 09/16/2008   Eosinophilia 07/21/2008   AFFECTIVE DISORDER 04/21/2008   BACK PAIN, CHRONIC 02/12/2008   LEG PAIN 02/12/2008   ABNORMAL INVOLUNTARY MOVEMENTS 12/04/2007   POSTURAL LIGHTHEADEDNESS 11/04/2007   COLONIC POLYPS, HX OF 11/04/2007   Essential hypertension 06/17/2007   HYPERLIPIDEMIA 04/08/2007   GERD 04/08/2007   LOW BACK PAIN SYNDROME 04/08/2007   CHEST PAIN, RECURRENT 04/08/2007     THERAPY DIAG:  Acute pain of left knee  Difficulty in walking, not elsewhere classified  Stiffness of left knee, not elsewhere classified  Localized edema  Muscle weakness (generalized)  PCP: [name], MD   REFERRING PROVIDER: [name] MD   REFERRING DIAG: M17.12 (ICD-10-CM) - Unilateral primary osteoarthritis, left knee   EVAL THERAPY DIAG:  Acute pain of left knee   Stiffness of left knee, not elsewhere classified   Difficulty in walking, not elsewhere classified   Localized edema   Muscle weakness (generalized)   Rationale for Evaluation and Treatment Rehabilitation   ONSET  DATE: 07/21/22   SUBJECTIVE:    SUBJECTIVE STATEMENT: Pt still reporting the pain is not bad today   PERTINENT HISTORY: Lt TKA 07/21/22 PMH: COPD,lumbar DDD and fusion L4-5 2002,,cerv fusion 2017, Rt THA, DM   PAIN:  NPRS scale: 2/10; hurts worst with bending Pain location: left knee Pain description: sharp pain when "it draws up and sticks", aching right now  Aggravating factors: nighttime, tightness Relieving factors: pain meds, ice, elevation   PRECAUTIONS: None   WEIGHT BEARING RESTRICTIONS No   FALLS:  Has patient fallen in last 6 months? No   LIVING ENVIRONMENT: Lives with: lives with their family Lives in: House/apartment Stairs: No Has following equipment at home: Gilford Rile - 4 wheeled, RW, st cane, grab bars in bathroom. Raised toilet seat,  Pt stating her shower chair doesn't  fit properly.    OCCUPATION: retired   PLOF: Independent   PATIENT GOALS walk without walker, stop hurting     OBJECTIVE:     PATIENT SURVEYS:  10/02/22: FOTO 60% (visit 10) 08/31/2022 FOTO 47 (was risk adjusted 39, Goal 55) 08/08/22: FOTO intake: 51%  predicted:  55%   COGNITION: 08/08/22 Overall cognitive status: WFL               SENSATION: 08/08/22: WFL   EDEMA:  08/08/22 Rt knee circumferential: 37 centimeters                 Left knee circumferential: 45 centimeters   MUSCLE LENGTH: Hamstrings: Right 50 deg; Left 68 deg     POSTURE: rounded shoulders and forward head   PALPATION: TTP: lateral joint line and around incision site, pt also    LOWER EXTREMITY STRENGTH:   MMT Right 08/08/22 Left 08/08/22 R/L 09/28/2022 Left 10/09/22  Hip flexion 5/5 4/5 4+ 4+  Hip extension        Hip abduction        Hip adduction        Hip internal rotation        Hip external rotation        Knee flexion 5/5 3/5 5/3+ 4+  Knee extension 5/5 3/5 5/3 4  Ankle dorsiflexion 5/5 5/5 5/5   Ankle plantarflexion 5/5 5/5 5/5   Ankle inversion        Ankle eversion         (  Blank  rows = not tested)   LOWER EXTREMITY ROM:   ROM A=active P=passive Right 08/08/22 supine Left 08/08/22 supine Left 08/09/22 Supine Left 08/16/22 Left 08/22/22 supine Left 08/29/22 supine Left 08/31/2022 Left 09/13/2022 Left 09/22/22 Left 09/26/22 Left 10/02/22 Left 10/09/22 Left 10/11/22 Left 10/24/22  Hip flexion                  Hip extension                  Knee flexion A: 128   A: 70 P: 80   A: 77 P: 85 P: 95 A: 85 P: 94 A:90 Active supine 92 AROM in supine heel slide 95 PROM sitting 93, AROM 88 sitting  P: 100 A: 90 Supine  P: 98  Sitting P:110 Sitting P: 112 Supine A: 112  Knee extension A: 0   A: -8 P: -5 A: -2 edema limited A: -12 (seated LAQ) A:-10 P: -8 A: -8 Active supine -2 Seated AROM LAQ : -4  Active quad set supine: -2  A: -2 A: -2      (Blank rows = not tested)      GAIT:  09/13/2022 : independent ambulation into clinic c decreased stance, lacking full TKE in stance on Lt leg  08/08/2022 Distance walked: 50 feet  Assistive device utilized: Walker - 4 wheeled Level of assistance: Modified independence Comments: antalgic gait pattern     TODAY'S TREATMENT: 10/26/22:  TherEx:  Sci fit bike seat # 7 X 8 min Standing slant board calf stretch x 2 holding 30 sec Standing heel raises/toe raises x 15 Leg press DL 81# 2X15, left leg only 31# 2X12 Step ups on 6 inch step x 10 Lateral step ups on 6 inch step x 10 Sit<>stand x10 with focus on TKE Seated LAQ 4# 2X10 on left  Manual: PROM knee flex and ext with overpressure  10/24/22:  TherEx:  Nustep: Level 6 x 6 minutes Standing slant board calf stretch x 2 holding 30 sec Standing heel raises/toe raises x 15 Standing hip abduction x 15 each LE Step ups on 6 inch step x 10 Lateral step ups on 6 inch step x 10 Sit<>stand x10 with focus on TKE Supine SLR: 2 x 10  Neuromuscular Re-edu Side stepping 20 feet x 2 Side stepping over dumbells with bar across the top (set of 2) Manual: PROM  knee flex and ext with overpressure    10/11/22: TherEx:  Scifit bike: seat at 6, L2 x 6 minute Supine hamstring stretch with strap 2x30 sec Supine quad set with heel on bolster 2x10 with overpressure Supine quad set + SLR 2x10 Sit<>stand x10 with focus on TKE Seated knee flexion 2x30 sec Runner's step up 6" step 2x10 with bilat UE support Side step up 6" step 2x10 Manual: PROM knee flex and ext with overpressure Scar massage Gait Forwards walking x30' focus on heel strike, knee extension and toe off without circumduction Backwards walking x 30' focus on knee extension and increasing L step length     PATIENT EDUCATION:  Education details: Scar massage Person educated: Patient Education method: Explanation, Demonstration, Verbal cues, and Handouts Education comprehension: verbalized understanding, returned demonstration, and verbal cues required    HOME EXERCISE PROGRAM: Access Code: [email or web address] URL: [email or web address] Date: 10/24/2022 Prepared by: Kearney Hard  Exercises - Seated Long Arc Quad  - 3-4 x daily - 7 x weekly - 2 sets - 10 reps - 3-5 seconds hold - Sit to  Stand with Counter Support  - 3-4 x daily - 7 x weekly - 2 sets - 10 reps - Seated Hamstring Stretch  - 3-4 x daily - 7 x weekly - 3 reps - 30 seconds hold - Supine Heel Slide with Strap  - 3-4 x daily - 7 x weekly - 5 reps - 10 seconds hold - Heel Raises with Counter Support  - 2-3 x daily - 7 x weekly - 15 reps - Supine Bridge  - 2 x daily - 7 x weekly - 2 sets - 10 reps - 5 seconds hold   ASSESSMENT:   CLINICAL IMPRESSION: She still has some knee stiffness but this is improving. I progressed her strength program some and she had good tolerance to this. PT recommending to continue current plan of care.    OBJECTIVE IMPAIRMENTS Abnormal gait, decreased balance, decreased mobility, difficulty walking, decreased ROM, decreased strength, increased edema, and pain.    ACTIVITY  LIMITATIONS bending, sitting, standing, sleeping, stairs, transfers, bed mobility, toileting, and dressing   PARTICIPATION LIMITATIONS: cleaning, laundry, shopping, and community activity   PERSONAL FACTORS 3+ comorbidities: see above  are also affecting patient's functional outcome.    REHAB POTENTIAL: Good   CLINICAL DECISION MAKING: Stable/uncomplicated   EVALUATION COMPLEXITY: Low     GOALS: Goals reviewed with patient? Yes   Short term PT Goals (target date for Short term goals are 3 weeks 09/01/22) Patient will demonstrate independent use of home exercise program to maintain progress from in clinic treatments. Goal status: MET 08/29/22   2. Pt will be able to improve her left knee flexion to >/= 90 degrees actively Goal Status: MET 08/29/22   Long term PT goals (target dates for all long term goals are 12 weeks  11/16/22 )   1. Patient will demonstrate/report pain at worst less than or equal to 2/10 to facilitate minimal limitation in daily activity secondary to pain symptoms. Goal status: On Going 10/24/22   2. Patient will demonstrate independent use of home exercise program to facilitate ability to maintain/progress functional gains from skilled physical therapy services. MET 10/09/22   3. Patient will demonstrate FOTO outcome > or = 55 % to indicate reduced disability due to condition. A. Goal status: On Going 10/24/22   4.  Patient will demonstrate left LE MMT 5/5 throughout to faciltiate usual transfers, stairs, squatting at [facility] for daily life.    A. Goal status: On Going 10/24/22  5.  Pt will improve her 5 time sit to stand to </= 15 seconds c/s UE support.    Goal status: On Going 10/24/22   6.  Pt will be able to navigate curb step with/without assistive device safely.   Goal status: MET 10/09/22    7. Pt will improve her left knee flexion to >/= 115 degrees for improved functional mobility and safely.  A. Goal Status: On Going 10/24/22         PLAN:   PT  FREQUENCY: 2x/week   PT DURATION: 12 weeks   PLANNED INTERVENTIONS: Therapeutic exercises, Therapeutic activity, Neuro Muscular re-education, Balance training, Gait training, Patient/Family education, Joint mobilization, Stair training, DME instructions, Dry Needling, Electrical stimulation, Traction, Cryotherapy, Moist heat, Taping, Ultrasound, Ionotophoresis 76m/ml Dexamethasone, and Manual therapy.  All included unless contraindicated     PLAN FOR NEXT SESSION:  Left knee strength and ROM focus  BDebbe Odea PT, DPT 10/26/22 3:52 PM

## 2022-10-30 ENCOUNTER — Other Ambulatory Visit: Payer: Self-pay | Admitting: Pulmonary Disease

## 2022-10-30 ENCOUNTER — Encounter: Payer: Medicare HMO | Admitting: Physical Therapy

## 2022-10-30 ENCOUNTER — Other Ambulatory Visit: Payer: Medicare HMO

## 2022-11-01 DIAGNOSIS — N183 Chronic kidney disease, stage 3 unspecified: Secondary | ICD-10-CM | POA: Diagnosis not present

## 2022-11-01 LAB — BASIC METABOLIC PANEL
Creatinine: 1.4 — AB (ref 0.5–1.1)
Creatinine: 1.4 — AB (ref 0.5–1.1)

## 2022-11-01 LAB — COMPREHENSIVE METABOLIC PANEL: eGFR: 42

## 2022-11-02 ENCOUNTER — Encounter: Payer: Self-pay | Admitting: Physical Therapy

## 2022-11-02 ENCOUNTER — Ambulatory Visit (INDEPENDENT_AMBULATORY_CARE_PROVIDER_SITE_OTHER): Payer: Medicare HMO | Admitting: Physical Therapy

## 2022-11-02 DIAGNOSIS — M25662 Stiffness of left knee, not elsewhere classified: Secondary | ICD-10-CM | POA: Diagnosis not present

## 2022-11-02 DIAGNOSIS — M25562 Pain in left knee: Secondary | ICD-10-CM

## 2022-11-02 DIAGNOSIS — R262 Difficulty in walking, not elsewhere classified: Secondary | ICD-10-CM

## 2022-11-02 DIAGNOSIS — M6281 Muscle weakness (generalized): Secondary | ICD-10-CM | POA: Diagnosis not present

## 2022-11-02 DIAGNOSIS — R600 Localized edema: Secondary | ICD-10-CM

## 2022-11-02 NOTE — Therapy (Signed)
OUTPATIENT PHYSICAL THERAPY TREATMENT NOTE    Patient Name: Desiree [name] MRN: [id number] DOB:Mar 13, 1951, 72 y.o., female Today's Date: 11/02/2022  END OF SESSION:   PT End of Session - 11/02/22 1523     Visit Number 17    Number of Visits 24    Date for PT Re-Evaluation 11/16/22    Authorization Type additional [employer] visits request at #13 on 10/09/22    Authorization Time Period 10/17-1/12    Authorization - Number of Visits 24    Progress Note Due on Visit 20    PT Start Time 1516    PT Stop Time 1600    PT Time Calculation (min) 44 min    Activity Tolerance Patient tolerated treatment well;Patient limited by pain    Behavior During Therapy [facility] for tasks assessed/performed                Past Medical History:  Diagnosis Date   Acute gastritis without bleeding 08/08/2018   Anemia    Anxiety    Asthma    Bipolar disorder ([place])    CANDIDIASIS, ESOPHAGEAL 07/28/2009   Qualifier: Diagnosis of  By: [name] MD, [name]    Chronic kidney disease    CKD III   Complication of anesthesia    was told she stopped breathing for one of her finger surgeries   COPD (chronic obstructive pulmonary disease) ([place])    Depression    Diabetes mellitus without complication (HCC)    no meds   Dyspnea    At rest,and with activity   FH: colonic polyps    Fractured elbow    right    GERD (gastroesophageal reflux disease)    Headache    migraines   HH (hiatus hernia)    History of carpal tunnel syndrome    History of chest pain    History of transfusion of packed red blood cells    Hyperlipidemia    Hypertension    Mitral valve prolapse    Neuroleptic-induced tardive dyskinesia    Osteoarthritis of more than one site    Pneumonia    Seasonal allergies    Stroke [facility])    Past Surgical History:  Procedure Laterality Date   AMPUTATION Left 10/14/2018   Procedure: AMPUTATION LEFT LONG FINGER TIP;  Surgeon: Dayna Barker, MD;  Location: [place];  Service: Plastics;  Laterality:  Left;   ANTERIOR CERVICAL DECOMP/DISCECTOMY FUSION  09/27/2016   C5-6 anterior cervical discectomy and fusion, allograft and plate/notes 09/27/2016   ANTERIOR CERVICAL DECOMP/DISCECTOMY FUSION N/A 09/27/2016   Procedure: C5-6 Anterior Cervical Discectomy and Fusion, Allograft and Plate;  Surgeon: [name], MD;  Location: [place];  Service: Orthopedics;  Laterality: N/A;   Back Fusion  2002   BIOPSY  07/19/2018   Procedure: BIOPSY;  Surgeon: [name], MD;  Location: [facility] ENDOSCOPY;  Service: Endoscopy;;   BIOPSY  02/13/2019   Procedure: BIOPSY;  Surgeon: [name], MD;  Location: [facility] ENDOSCOPY;  Service: Endoscopy;;   CARPAL TUNNEL RELEASE  yates   left   COLONOSCOPY N/A 01/05/2014   Procedure: COLONOSCOPY;  Surgeon: [name], MD;  Location: [facility] ENDOSCOPY;  Service: Endoscopy;  Laterality: N/A;   ELBOW SURGERY     age 66   ENTEROSCOPY N/A 02/13/2019   Procedure: ENTEROSCOPY;  Surgeon: [name], MD;  Location: [facility] ENDOSCOPY;  Service: Endoscopy;  Laterality: N/A;   ESOPHAGOGASTRODUODENOSCOPY (EGD) WITH PROPOFOL N/A 07/19/2018   Procedure: ESOPHAGOGASTRODUODENOSCOPY (EGD) WITH PROPOFOL;  Surgeon: [name],  MD;  Location: [place] ENDOSCOPY;  Service: Endoscopy;  Laterality: N/A;   EXTERNAL EAR SURGERY Left    EYE SURGERY     "removed white dots under eyelid"   FINGER SURGERY Left    INJECTION KNEE Right 07/21/2022   Procedure: INTRA-ARTICULAR INJECTION UNDER ANESTHESIA;  Surgeon: [name], MD;  Location: [place];  Service: Orthopedics;  Laterality: Right;   Juvara osteomy     KNEE SURGERY     NOSE SURGERY     Rt. toe bunion     skin, shave biopsy  05/03/2016   Left occipital scalp, top of scalp   TONSILLECTOMY     TOTAL HIP ARTHROPLASTY Right 12/17/2020   TOTAL HIP ARTHROPLASTY Right 12/17/2020   Procedure: TOTAL HIP ARTHROPLASTY-DIRECT ANTERIOR;  Surgeon: [name], MD;  Location: [place];  Service: Orthopedics;  Laterality: Right;  needs RNFA   TOTAL KNEE ARTHROPLASTY Left  07/21/2022   Procedure: LEFT TOTAL KNEE ARTHROPLASTY;  Surgeon: [name], MD;  Location: [place];  Service: Orthopedics;  Laterality: Left;   UPPER EXTREMITY ANGIOGRAPHY Bilateral 10/11/2018   Procedure: UPPER EXTREMITY ANGIOGRAPHY;  Surgeon: [name], MD;  Location: [place] CV LAB;  Service: Cardiovascular;  Laterality: Bilateral;   Patient Active Problem List   Diagnosis Date Noted   History of total knee arthroplasty, left 08/04/2022   Hypertrophic cardiomyopathy ([place]) 07/10/2022   Insomnia secondary to chronic pain 12/27/2020   Insomnia due to other mental disorder 12/27/2020   Snoring 12/27/2020   Inadequate sleep hygiene 12/27/2020   S/P total right hip arthroplasty 12/18/2020   Arthritis of right hip 12/17/2020   Encounter for preoperative assessment 12/10/2020   Infection of nail bed of finger of right hand 09/15/2020   Pain in right finger(s) 08/31/2020   Ganglion of right wrist 08/31/2020   COPD mixed type ([place]) 12/26/2018   Healthcare maintenance 12/26/2018   History of fall 11/05/2018   Weight loss 11/05/2018   Weakness 11/05/2018   Gangrene of finger ([place]) 10/09/2018   Raynaud's phenomenon with gangrene ([place]) 10/09/2018   LVH (left ventricular hypertrophy) 10/09/2018   Vasospasm ([place]) 09/06/2018   Hypotension 08/08/2018   Leucocytosis 08/08/2018   Elevated troponin I level 08/08/2018   Nausea and vomiting 08/06/2018   Pneumonia 07/12/2018   COPD exacerbation ([place]) 07/11/2018   Primary osteoarthritis of both feet 12/21/2017   DDD (degenerative disc disease), lumbar 12/21/2017   Primary osteoarthritis of both knees 12/21/2017   History of bilateral carpal tunnel release 12/21/2017   DDD (degenerative disc disease), cervical 11/15/2017   Former smoker 11/15/2017   Bronchiectasis without complication ([place]) 16/07/9603   Impingement syndrome of right shoulder 07/25/2017   Hx of fusion of cervical spine 07/25/2017   Cervical spinal stenosis 09/27/2016    Polypharmacy 06/23/2015   Hyperlipidemia 04/19/2015   Visit for preventive health examination 01/01/2015   Primary osteoarthritis involving multiple joints 01/01/2015   Bipolar affective disorder, currently depressed, moderate ([place])    Bipolar I disorder with mania ([place]) 08/17/2014   Hypertension 10/21/2013   Dizziness 03/25/2013   Medication withdrawal ([place]) 03/05/2013   Diabetes mellitus with renal manifestations, controlled ([place]) 11/10/2012   Alopecia areata 02/06/2012   Obesity (BMI 30-39.9) 02/06/2012   Renal insufficiency 07/16/2011   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    Exertional dyspnea 02/07/2011   Dyspnea on exertion 01/19/2011   DM (diabetes mellitus), type 2 ([place]) 04/22/2010   Carpal tunnel syndrome 02/02/2010   CONSTIPATION 02/02/2010   Primary  osteoarthritis of both hands 02/02/2010   CATARACTS 04/13/2009   PAIN IN JOINT, ANKLE AND FOOT 09/16/2008   Eosinophilia 07/21/2008   AFFECTIVE DISORDER 04/21/2008   BACK PAIN, CHRONIC 02/12/2008   LEG PAIN 02/12/2008   ABNORMAL INVOLUNTARY MOVEMENTS 12/04/2007   POSTURAL LIGHTHEADEDNESS 11/04/2007   COLONIC POLYPS, HX OF 11/04/2007   Essential hypertension 06/17/2007   HYPERLIPIDEMIA 04/08/2007   GERD 04/08/2007   LOW BACK PAIN SYNDROME 04/08/2007   CHEST PAIN, RECURRENT 04/08/2007     THERAPY DIAG:  Acute pain of left knee  Difficulty in walking, not elsewhere classified  Stiffness of left knee, not elsewhere classified  Localized edema  Muscle weakness (generalized)  PCP: Madelin Headings, MD   REFERRING PROVIDER: Eldred Manges MD   REFERRING DIAG: M17.12 (ICD-10-CM) - Unilateral primary osteoarthritis, left knee   EVAL THERAPY DIAG:  Acute pain of left knee   Stiffness of left knee, not elsewhere classified   Difficulty in walking, not elsewhere classified   Localized edema   Muscle weakness (generalized)   Rationale for Evaluation and Treatment Rehabilitation   ONSET DATE: 07/21/22    SUBJECTIVE:    SUBJECTIVE STATEMENT: She reports she has been hurting since the last session and rates her pain currently at an 8/10 on the medial side and 7/10. She also reports that her knee is feeling very stiff and has been waking up at night with cramps ad pain.    PERTINENT HISTORY: Lt TKA 07/21/22 PMH: COPD,lumbar DDD and fusion L4-5 2002,,cerv fusion 2017, Rt THA, DM   PAIN:  NPRS scale: 8/10; hurts worst with bending Pain location: left knee Pain description: sharp pain when "it draws up and sticks", aching right now  Aggravating factors: nighttime, tightness Relieving factors: pain meds, ice, elevation   PRECAUTIONS: None   WEIGHT BEARING RESTRICTIONS No   FALLS:  Has patient fallen in last 6 months? No   LIVING ENVIRONMENT: Lives with: lives with their family Lives in: House/apartment Stairs: No Has following equipment at home: [name] - 4 wheeled, RW, st cane, grab bars in bathroom. Raised toilet seat,  Pt stating her shower chair doesn't  fit properly.    OCCUPATION: retired   PLOF: Independent   PATIENT GOALS walk without walker, stop hurting     OBJECTIVE:     PATIENT SURVEYS:  10/02/22: FOTO 60% (visit 10) 08/31/2022 FOTO 47 (was risk adjusted 39, Goal 55) 08/08/22: FOTO intake: 51%  predicted:  55%   COGNITION: 08/08/22 Overall cognitive status: WFL               SENSATION: 08/08/22: WFL   EDEMA:  08/08/22 Rt knee circumferential: 37 centimeters                 Left knee circumferential: 45 centimeters   MUSCLE LENGTH: Hamstrings: Right 50 deg; Left 68 deg     POSTURE: rounded shoulders and forward head   PALPATION: TTP: lateral joint line and around incision site, pt also    LOWER EXTREMITY STRENGTH:   MMT Right 08/08/22 Left 08/08/22 R/L 09/28/2022 Left 10/09/22  Hip flexion 5/5 4/5 4+ 4+  Hip extension        Hip abduction        Hip adduction        Hip internal rotation        Hip external rotation        Knee flexion  5/5 3/5 5/3+ 4+  Knee extension 5/5 3/5 5/3 4  Ankle dorsiflexion 5/5 5/5 5/5   Ankle plantarflexion 5/5 5/5 5/5   Ankle inversion        Ankle eversion         (Blank rows = not tested)   LOWER EXTREMITY ROM:   ROM A=active P=passive Right 08/08/22 supine Left 08/08/22 supine Left 08/09/22 Supine Left 08/16/22 Left 08/22/22 supine Left 08/29/22 supine Left 08/31/2022 Left 09/13/2022 Left 09/22/22 Left 09/26/22 Left 10/02/22 Left 10/09/22 Left 10/11/22 Left 10/24/22  Hip flexion                  Hip extension                  Knee flexion A: 128   A: 70 P: 80   A: 77 P: 85 P: 95 A: 85 P: 94 A:90 Active supine 92 AROM in supine heel slide 95 PROM sitting 93, AROM 88 sitting  P: 100 A: 90 Supine  P: 98  Sitting P:110 Sitting P: 112 Supine A: 112  Knee extension A: 0   A: -8 P: -5 A: -2 edema limited A: -12 (seated LAQ) A:-10 P: -8 A: -8 Active supine -2 Seated AROM LAQ : -4  Active quad set supine: -2  A: -2 A: -2      (Blank rows = not tested)      GAIT: 1/11/202: stiff gait with decreased knee flexion in swing phase and decreased stance time on left LE  09/13/2022 : independent ambulation into clinic c decreased stance, lacking full TKE in stance on Lt leg  08/08/2022 Distance walked: 50 feet  Assistive device utilized: Walker - 4 wheeled Level of assistance: Modified independence Comments: antalgic gait pattern     TODAY'S TREATMENT: 11/02/22:  TherEx:  Sci fit bike seat # 9 X 9 min Standing slant board calf stretch x 2 holding 30 sec Knee flexion Lunge stretch with foot on treadmill x10 holding for 5 sec Leg press DL 74# 2V95, left leg only 25# 2X10 Step ups on 4 inch step x 10 Lateral step ups on 4 inch step x 10 Sit to stands x10 Assisted knee flexion with strap x10 holding for 5 sec    10/26/22:  TherEx:  Sci fit bike seat # 7 X 8 min Standing slant board calf stretch x 2 holding 30 sec Standing heel raises/toe raises x 15 Leg press DL  63# 8V56, left leg only 31# 2X12 Step ups on 6 inch step x 10 Lateral step ups on 6 inch step x 10 Sit<>stand x10 with focus on TKE Seated LAQ 4# 2X10 on left   Manual: PROM knee flex and ext with overpressure    PATIENT EDUCATION:  Education details: water intake to help with cramping  Person educated: Patient Education method: Programmer, multimedia, Demonstration, Verbal cues, and Handouts Education comprehension: verbalized understanding, returned demonstration, and verbal cues required    HOME EXERCISE PROGRAM: Access Code: [email or web address] URL: [email or web address] Date: 10/24/2022 Prepared by: Narda Amber  Exercises - Seated Long Arc Quad  - 3-4 x daily - 7 x weekly - 2 sets - 10 reps - 3-5 seconds hold - Sit to Stand with Counter Support  - 3-4 x daily - 7 x weekly - 2 sets - 10 reps - Seated Hamstring Stretch  - 3-4 x daily - 7 x weekly - 3 reps - 30 seconds hold - Supine Heel Slide with Strap  - 3-4 x daily - 7 x weekly - 5 reps - 10 seconds  hold - Heel Raises with Counter Support  - 2-3 x daily - 7 x weekly - 15 reps - Supine Bridge  - 2 x daily - 7 x weekly - 2 sets - 10 reps - 5 seconds hold   ASSESSMENT:   CLINICAL IMPRESSION: For this session we dialed it back due to this discomfort in pain and stiffness, but still worked on strength and mobility which she had good tolerance. She reported more pain with PT assisted passive knee flexion so gave her independently controlled flexion exercises as she could still benefit from improvements in her knee mobility especially with flexion.    OBJECTIVE IMPAIRMENTS Abnormal gait, decreased balance, decreased mobility, difficulty walking, decreased ROM, decreased strength, increased edema, and pain.    ACTIVITY LIMITATIONS bending, sitting, standing, sleeping, stairs, transfers, bed mobility, toileting, and dressing   PARTICIPATION LIMITATIONS: cleaning, laundry, shopping, and community activity   PERSONAL FACTORS 3+  comorbidities: see above  are also affecting patient's functional outcome.    REHAB POTENTIAL: Good   CLINICAL DECISION MAKING: Stable/uncomplicated   EVALUATION COMPLEXITY: Low     GOALS: Goals reviewed with patient? Yes   Short term PT Goals (target date for Short term goals are 3 weeks 09/01/22) Patient will demonstrate independent use of home exercise program to maintain progress from in clinic treatments. Goal status: MET 08/29/22   2. Pt will be able to improve her left knee flexion to >/= 90 degrees actively Goal Status: MET 08/29/22   Long term PT goals (target dates for all long term goals are 12 weeks  11/16/22 )   1. Patient will demonstrate/report pain at worst less than or equal to 2/10 to facilitate minimal limitation in daily activity secondary to pain symptoms. Goal status: On Going 10/24/22   2. Patient will demonstrate independent use of home exercise program to facilitate ability to maintain/progress functional gains from skilled physical therapy services. MET 10/09/22   3. Patient will demonstrate FOTO outcome > or = 55 % to indicate reduced disability due to condition. A. Goal status: On Going 10/24/22   4.  Patient will demonstrate left LE MMT 5/5 throughout to faciltiate usual transfers, stairs, squatting at [facility] for daily life.    A. Goal status: On Going 10/24/22  5.  Pt will improve her 5 time sit to stand to </= 15 seconds c/s UE support.    Goal status: On Going 10/24/22   6.  Pt will be able to navigate curb step with/without assistive device safely.   Goal status: MET 10/09/22    7. Pt will improve her left knee flexion to >/= 115 degrees for improved functional mobility and safely.  A. Goal Status: On Going 10/24/22         PLAN:   PT FREQUENCY: 2x/week   PT DURATION: 12 weeks   PLANNED INTERVENTIONS: Therapeutic exercises, Therapeutic activity, Neuro Muscular re-education, Balance training, Gait training, Patient/Family education, Joint  mobilization, Stair training, DME instructions, Dry Needling, Electrical stimulation, Traction, Cryotherapy, Moist heat, Taping, Ultrasound, Ionotophoresis 4mg /ml Dexamethasone, and Manual therapy.  All included unless contraindicated     PLAN FOR NEXT SESSION:  Check in on pain and stiffness. Continue with strength and ROM especially knee flexion.   Rondey Fallen, Student-PT 11/02/22 4:48 PM

## 2022-11-06 ENCOUNTER — Ambulatory Visit: Payer: Medicare HMO | Admitting: Physical Therapy

## 2022-11-06 ENCOUNTER — Encounter: Payer: Self-pay | Admitting: Physical Therapy

## 2022-11-06 DIAGNOSIS — M6281 Muscle weakness (generalized): Secondary | ICD-10-CM

## 2022-11-06 DIAGNOSIS — M25662 Stiffness of left knee, not elsewhere classified: Secondary | ICD-10-CM | POA: Diagnosis not present

## 2022-11-06 DIAGNOSIS — M25562 Pain in left knee: Secondary | ICD-10-CM | POA: Diagnosis not present

## 2022-11-06 DIAGNOSIS — R262 Difficulty in walking, not elsewhere classified: Secondary | ICD-10-CM

## 2022-11-06 DIAGNOSIS — R600 Localized edema: Secondary | ICD-10-CM

## 2022-11-06 NOTE — Therapy (Signed)
OUTPATIENT PHYSICAL THERAPY TREATMENT NOTE    Patient Name: Desiree [name] MRN: [id number] DOB:1950-11-25, 72 y.o., female Today's Date: 11/06/2022  END OF SESSION:   PT End of Session - 11/06/22 1434     Visit Number 18    Number of Visits 24    Date for PT Re-Evaluation 11/16/22    Authorization Type additional [employer] visits request at #[id number] on 10/09/22, approved to 11/16/22    Authorization Time Period 10/17-1/12, approved to 11/16/22    Authorization - Number of Visits 24    Progress Note Due on Visit 20    PT Start Time 1430    PT Stop Time 1511    PT Time Calculation (min) 41 min    Activity Tolerance Patient tolerated treatment well;Patient limited by pain    Behavior During Therapy [facility] for tasks assessed/performed                Past Medical History:  Diagnosis Date   Acute gastritis without bleeding 08/08/2018   Anemia    Anxiety    Asthma    Bipolar disorder ([place])    CANDIDIASIS, ESOPHAGEAL 07/28/2009   Qualifier: Diagnosis of  By: [name] MD, [name]    Chronic kidney disease    CKD III   Complication of anesthesia    was told she stopped breathing for one of her finger surgeries   COPD (chronic obstructive pulmonary disease) ([place])    Depression    Diabetes mellitus without complication (HCC)    no meds   Dyspnea    At rest,and with activity   FH: colonic polyps    Fractured elbow    right    GERD (gastroesophageal reflux disease)    Headache    migraines   HH (hiatus hernia)    History of carpal tunnel syndrome    History of chest pain    History of transfusion of packed red blood cells    Hyperlipidemia    Hypertension    Mitral valve prolapse    Neuroleptic-induced tardive dyskinesia    Osteoarthritis of more than one site    Pneumonia    Seasonal allergies    Stroke [facility])    Past Surgical History:  Procedure Laterality Date   AMPUTATION Left 10/14/2018   Procedure: AMPUTATION LEFT LONG FINGER TIP;  Surgeon: Dayna Barker, MD;   Location: [place];  Service: Plastics;  Laterality: Left;   ANTERIOR CERVICAL DECOMP/DISCECTOMY FUSION  09/27/2016   C5-6 anterior cervical discectomy and fusion, allograft and plate/notes 09/27/2016   ANTERIOR CERVICAL DECOMP/DISCECTOMY FUSION N/A 09/27/2016   Procedure: C5-6 Anterior Cervical Discectomy and Fusion, Allograft and Plate;  Surgeon: [name], MD;  Location: [place];  Service: Orthopedics;  Laterality: N/A;   Back Fusion  2002   BIOPSY  07/19/2018   Procedure: BIOPSY;  Surgeon: [name], MD;  Location: [facility] ENDOSCOPY;  Service: Endoscopy;;   BIOPSY  02/13/2019   Procedure: BIOPSY;  Surgeon: [name], MD;  Location: [facility] ENDOSCOPY;  Service: Endoscopy;;   CARPAL TUNNEL RELEASE  yates   left   COLONOSCOPY N/A 01/05/2014   Procedure: COLONOSCOPY;  Surgeon: [name], MD;  Location: [facility] ENDOSCOPY;  Service: Endoscopy;  Laterality: N/A;   ELBOW SURGERY     age 23   ENTEROSCOPY N/A 02/13/2019   Procedure: ENTEROSCOPY;  Surgeon: [name], MD;  Location: [facility] ENDOSCOPY;  Service: Endoscopy;  Laterality: N/A;   ESOPHAGOGASTRODUODENOSCOPY (EGD) WITH PROPOFOL N/A 07/19/2018   Procedure: ESOPHAGOGASTRODUODENOSCOPY (EGD)  WITH PROPOFOL;  Surgeon: Jeani Hawking, MD;  Location: [facility] ENDOSCOPY;  Service: Endoscopy;  Laterality: N/A;   EXTERNAL EAR SURGERY Left    EYE SURGERY     "removed white dots under eyelid"   FINGER SURGERY Left    INJECTION KNEE Right 07/21/2022   Procedure: INTRA-ARTICULAR INJECTION UNDER ANESTHESIA;  Surgeon: Eldred Manges, MD;  Location[facility];  Service: Orthopedics;  Laterality: Right;   Juvara osteomy     KNEE SURGERY     NOSE SURGERY     Rt. toe bunion     skin, shave biopsy  05/03/2016   Left occipital scalp, top of scalp   TONSILLECTOMY     TOTAL HIP ARTHROPLASTY Right 12/17/2020   TOTAL HIP ARTHROPLASTY Right 12/17/2020   Procedure: TOTAL HIP ARTHROPLASTY-DIRECT ANTERIOR;  Surgeon: Eldred Manges, MD;  Location: [facility] OR;  Service: Orthopedics;  Laterality:  Right;  needs RNFA   TOTAL KNEE ARTHROPLASTY Left 07/21/2022   Procedure: LEFT TOTAL KNEE ARTHROPLASTY;  Surgeon: Eldred Manges, MD;  Location: [facility] OR;  Service: Orthopedics;  Laterality: Left;   UPPER EXTREMITY ANGIOGRAPHY Bilateral 10/11/2018   Procedure: UPPER EXTREMITY ANGIOGRAPHY;  Surgeon: Sherren Kerns, MD;  Location: [facility] INVASIVE CV LAB;  Service: Cardiovascular;  Laterality: Bilateral;   Patient Active Problem List   Diagnosis Date Noted   History of total knee arthroplasty, left 08/04/2022   Hypertrophic cardiomyopathy (HCC) 07/10/2022   Insomnia secondary to chronic pain 12/27/2020   Insomnia due to other mental disorder 12/27/2020   Snoring 12/27/2020   Inadequate sleep hygiene 12/27/2020   S/P total right hip arthroplasty 12/18/2020   Arthritis of right hip 12/17/2020   Encounter for preoperative assessment 12/10/2020   Infection of nail bed of finger of right hand 09/15/2020   Pain in right finger(s) 08/31/2020   Ganglion of right wrist 08/31/2020   COPD mixed type (HCC) 12/26/2018   Healthcare maintenance 12/26/2018   History of fall 11/05/2018   Weight loss 11/05/2018   Weakness 11/05/2018   Gangrene of finger (HCC) 10/09/2018   Raynaud's phenomenon with gangrene (HCC) 10/09/2018   LVH (left ventricular hypertrophy) 10/09/2018   Vasospasm (HCC) 09/06/2018   Hypotension 08/08/2018   Leucocytosis 08/08/2018   Elevated troponin I level 08/08/2018   Nausea and vomiting 08/06/2018   Pneumonia 07/12/2018   COPD exacerbation (HCC) 07/11/2018   Primary osteoarthritis of both feet 12/21/2017   DDD (degenerative disc disease), lumbar 12/21/2017   Primary osteoarthritis of both knees 12/21/2017   History of bilateral carpal tunnel release 12/21/2017   DDD (degenerative disc disease), cervical 11/15/2017   Former smoker 11/15/2017   Bronchiectasis without complication (HCC) 10/25/2017   Impingement syndrome of right shoulder 07/25/2017   Hx of fusion of cervical spine  07/25/2017   Cervical spinal stenosis 09/27/2016   Polypharmacy 06/23/2015   Hyperlipidemia 04/19/2015   Visit for preventive health examination 01/01/2015   Primary osteoarthritis involving multiple joints 01/01/2015   Bipolar affective disorder, currently depressed, moderate (HCC)    Bipolar I disorder with mania (HCC) 08/17/2014   Hypertension 10/21/2013   Dizziness 03/25/2013   Medication withdrawal (HCC) 03/05/2013   Diabetes mellitus with renal manifestations, controlled (HCC) 11/10/2012   Alopecia areata 02/06/2012   Obesity (BMI 30-39.9) 02/06/2012   Renal insufficiency 07/16/2011   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    Exertional dyspnea 02/07/2011   Dyspnea on exertion 01/19/2011   DM (diabetes mellitus), type 2 (HCC) 04/22/2010   Carpal tunnel syndrome 02/02/2010  CONSTIPATION 02/02/2010   Primary osteoarthritis of both hands 02/02/2010   CATARACTS 04/13/2009   PAIN IN JOINT, ANKLE AND FOOT 09/16/2008   Eosinophilia 07/21/2008   AFFECTIVE DISORDER 04/21/2008   BACK PAIN, CHRONIC 02/12/2008   LEG PAIN 02/12/2008   ABNORMAL INVOLUNTARY MOVEMENTS 12/04/2007   POSTURAL LIGHTHEADEDNESS 11/04/2007   COLONIC POLYPS, HX OF 11/04/2007   Essential hypertension 06/17/2007   HYPERLIPIDEMIA 04/08/2007   GERD 04/08/2007   LOW BACK PAIN SYNDROME 04/08/2007   CHEST PAIN, RECURRENT 04/08/2007     THERAPY DIAG:  Acute pain of left knee  Difficulty in walking, not elsewhere classified  Stiffness of left knee, not elsewhere classified  Localized edema  Muscle weakness (generalized)  PCP: [name], MD   REFERRING PROVIDER: [name] MD   REFERRING DIAG: M17.12 (ICD-10-CM) - Unilateral primary osteoarthritis, left knee   EVAL THERAPY DIAG:  Acute pain of left knee   Stiffness of left knee, not elsewhere classified   Difficulty in walking, not elsewhere classified   Localized edema   Muscle weakness (generalized)   Rationale for Evaluation and  Treatment Rehabilitation   ONSET DATE: 07/21/22   SUBJECTIVE:    SUBJECTIVE STATEMENT: She reports she has been hurting since the last session and rates her pain currently at an 8/10 on the medial side and 7/10. She also reports that her knee is feeling very stiff and has been waking up at night with cramps ad pain.    PERTINENT HISTORY: Lt TKA 07/21/22 PMH: COPD,lumbar DDD and fusion L4-5 2002,,cerv fusion 2017, Rt THA, DM   PAIN:  NPRS scale: 8/10; hurts worst with bending Pain location: left knee Pain description: sharp pain when "it draws up and sticks", aching right now  Aggravating factors: nighttime, tightness Relieving factors: pain meds, ice, elevation   PRECAUTIONS: None   WEIGHT BEARING RESTRICTIONS No   FALLS:  Has patient fallen in last 6 months? No   LIVING ENVIRONMENT: Lives with: lives with their family Lives in: House/apartment Stairs: No Has following equipment at home: Gilford Rile - 4 wheeled, RW, st cane, grab bars in bathroom. Raised toilet seat,  Pt stating her shower chair doesn't  fit properly.    OCCUPATION: retired   PLOF: Independent   PATIENT GOALS walk without walker, stop hurting     OBJECTIVE:     PATIENT SURVEYS:  10/02/22: FOTO 60% (visit 10) 08/31/2022 FOTO 47 (was risk adjusted 39, Goal 55) 08/08/22: FOTO intake: 51%  predicted:  55%   COGNITION: 08/08/22 Overall cognitive status: WFL               SENSATION: 08/08/22: WFL   EDEMA:  08/08/22 Rt knee circumferential: 37 centimeters                 Left knee circumferential: 45 centimeters   MUSCLE LENGTH: Hamstrings: Right 50 deg; Left 68 deg     POSTURE: rounded shoulders and forward head   PALPATION: TTP: lateral joint line and around incision site, pt also    LOWER EXTREMITY STRENGTH:   MMT Right 08/08/22 Left 08/08/22 R/L 09/28/2022 Left 10/09/22  Hip flexion 5/5 4/5 4+ 4+  Hip extension        Hip abduction        Hip adduction        Hip internal rotation         Hip external rotation        Knee flexion 5/5 3/5 5/3+ 4+  Knee extension  5/5 3/5 5/3 4  Ankle dorsiflexion 5/5 5/5 5/5   Ankle plantarflexion 5/5 5/5 5/5   Ankle inversion        Ankle eversion         (Blank rows = not tested)   LOWER EXTREMITY ROM:   ROM A=active P=passive Right 08/08/22 supine Left 08/08/22 supine Left 08/09/22 Supine Left 08/16/22 Left 08/22/22 supine Left 08/29/22 supine Left 08/31/2022 Left 09/13/2022 Left 09/22/22 Left 09/26/22 Left 10/02/22 Left 10/09/22 Left 10/11/22 Left 10/24/22 Left 11/06/22  Hip flexion                   Hip extension                   Knee flexion A: 128   A: 70 P: 80   A: 77 P: 85 P: 95 A: 85 P: 94 A:90 Active supine 92 AROM in supine heel slide 95 PROM sitting 93, AROM 88 sitting  P: 100 A: 90 Supine  P: 98  Sitting P:110 Sitting P: 112 Supine A: 112 Supine A: 110 P: 114  Knee extension A: 0   A: -8 P: -5 A: -2 edema limited A: -12 (seated LAQ) A:-10 P: -8 A: -8 Active supine -2 Seated AROM LAQ : -4  Active quad set supine: -2  A: -2 A: -2       (Blank rows = not tested)      GAIT: 1/11/202: stiff gait with decreased knee flexion in swing phase and decreased stance time on left LE  09/13/2022 : independent ambulation into clinic c decreased stance, lacking full TKE in stance on Lt leg  08/08/2022 Distance walked: 50 feet  Assistive device utilized: Walker - 4 wheeled Level of assistance: Modified independence Comments: antalgic gait pattern     TODAY'S TREATMENT: 11/06/22:  TherEx:  Sci fit bike seat #6 X 7 min Standing slant board calf stretch x 2 holding 30 sec Leg press DL 75# 2X10, left leg only 25# 2X10 Standing hip extension: 2 x 10 each LE c UE support Step ups on 6 inch step x 10 Lateral step ups on 6 inch step x 15 Neuromuscular Re-edu:  Tandem walking: in parallel bars x 8 with finger tap support Sidestepping over cones lying on their side x 6 both directions   Stepping over  cones lying on their side x 6 both directions.    11/02/22:  TherEx:  Sci fit bike seat # 9 X 9 min Standing slant board calf stretch x 2 holding 30 sec Knee flexion Lunge stretch with foot on treadmill x10 holding for 5 sec Leg press DL 75# 2X10, left leg only 25# 2X10 Step ups on 4 inch step x 10 Lateral step ups on 4 inch step x 10 Sit to stands x10 Assisted knee flexion with strap x10 holding for 5 sec    10/26/22:  TherEx:  Sci fit bike seat # 7 X 8 min Standing slant board calf stretch x 2 holding 30 sec Standing heel raises/toe raises x 15 Leg press DL 81# 2X15, left leg only 31# 2X12 Step ups on 6 inch step x 10 Lateral step ups on 6 inch step x 10 Sit<>stand x10 with focus on TKE Seated LAQ 4# 2X10 on left   Manual: PROM knee flex and ext with overpressure    PATIENT EDUCATION:  Education details: water intake to help with cramping  Person educated: Patient Education method: Explanation, Demonstration, Verbal cues, and Handouts  Education comprehension: verbalized understanding, returned demonstration, and verbal cues required    HOME EXERCISE PROGRAM: Access Code: [email or web address] URL: [email or web address] Date: 10/24/2022 Prepared by: Kearney Hard  Exercises - Seated Long Arc Quad  - 3-4 x daily - 7 x weekly - 2 sets - 10 reps - 3-5 seconds hold - Sit to Stand with Counter Support  - 3-4 x daily - 7 x weekly - 2 sets - 10 reps - Seated Hamstring Stretch  - 3-4 x daily - 7 x weekly - 3 reps - 30 seconds hold - Supine Heel Slide with Strap  - 3-4 x daily - 7 x weekly - 5 reps - 10 seconds hold - Heel Raises with Counter Support  - 2-3 x daily - 7 x weekly - 15 reps - Supine Bridge  - 2 x daily - 7 x weekly - 2 sets - 10 reps - 5 seconds hold   ASSESSMENT:   CLINICAL IMPRESSION: Pt still with stiffness and weakness noted in Left knee. Pt's Left knee flexion AROM to 110 degree and passive ROM 114 degrees. Pt tolerating neuromuscualar re-education  and exercises well  today. Pt was encouraged to continue to work on knee flexion at home using band/sheet. Continue skilled PT to progress toward LTG's set.     OBJECTIVE IMPAIRMENTS Abnormal gait, decreased balance, decreased mobility, difficulty walking, decreased ROM, decreased strength, increased edema, and pain.    ACTIVITY LIMITATIONS bending, sitting, standing, sleeping, stairs, transfers, bed mobility, toileting, and dressing   PARTICIPATION LIMITATIONS: cleaning, laundry, shopping, and community activity   PERSONAL FACTORS 3+ comorbidities: see above  are also affecting patient's functional outcome.    REHAB POTENTIAL: Good   CLINICAL DECISION MAKING: Stable/uncomplicated   EVALUATION COMPLEXITY: Low     GOALS: Goals reviewed with patient? Yes   Short term PT Goals (target date for Short term goals are 3 weeks 09/01/22) Patient will demonstrate independent use of home exercise program to maintain progress from in clinic treatments. Goal status: MET 08/29/22   2. Pt will be able to improve her left knee flexion to >/= 90 degrees actively Goal Status: MET 08/29/22   Long term PT goals (target dates for all long term goals are 12 weeks  11/16/22 )   1. Patient will demonstrate/report pain at worst less than or equal to 2/10 to facilitate minimal limitation in daily activity secondary to pain symptoms. Goal status: On Going 10/24/22   2. Patient will demonstrate independent use of home exercise program to facilitate ability to maintain/progress functional gains from skilled physical therapy services. MET 10/09/22   3. Patient will demonstrate FOTO outcome > or = 55 % to indicate reduced disability due to condition. A. Goal status: On Going 10/24/22   4.  Patient will demonstrate left LE MMT 5/5 throughout to faciltiate usual transfers, stairs, squatting at [facility] for daily life.    A. Goal status: On Going 10/24/22  5.  Pt will improve her 5 time sit to stand to </= 15 seconds  c/s UE support.    Goal status: On Going 10/24/22   6.  Pt will be able to navigate curb step with/without assistive device safely.   Goal status: MET 10/09/22    7. Pt will improve her left knee flexion to >/= 115 degrees for improved functional mobility and safely.  A. Goal Status: On Going 10/24/22         PLAN:   PT FREQUENCY: 2x/week   PT DURATION: 12 weeks  PLANNED INTERVENTIONS: Therapeutic exercises, Therapeutic activity, Neuro Muscular re-education, Balance training, Gait training, Patient/Family education, Joint mobilization, Stair training, DME instructions, Dry Needling, Electrical stimulation, Traction, Cryotherapy, Moist heat, Taping, Ultrasound, Ionotophoresis 4mg /ml Dexamethasone, and Manual therapy.  All included unless contraindicated     PLAN FOR NEXT SESSION:  Check in on pain and stiffness. Continue with strength and ROM especially knee flexion.   Oretha Caprice, PT, MPT 11/06/22 3:15 PM

## 2022-11-07 ENCOUNTER — Other Ambulatory Visit: Payer: Self-pay | Admitting: Internal Medicine

## 2022-11-07 ENCOUNTER — Ambulatory Visit (INDEPENDENT_AMBULATORY_CARE_PROVIDER_SITE_OTHER): Payer: Medicare HMO

## 2022-11-07 DIAGNOSIS — E1151 Type 2 diabetes mellitus with diabetic peripheral angiopathy without gangrene: Secondary | ICD-10-CM

## 2022-11-07 NOTE — Progress Notes (Signed)
Pick Up Diabetic shoes. PT states she has plenty of room and Shoes feel good. Instructions given and forms were signed.

## 2022-11-08 DIAGNOSIS — D631 Anemia in chronic kidney disease: Secondary | ICD-10-CM | POA: Diagnosis not present

## 2022-11-08 DIAGNOSIS — E1122 Type 2 diabetes mellitus with diabetic chronic kidney disease: Secondary | ICD-10-CM | POA: Diagnosis not present

## 2022-11-08 DIAGNOSIS — N1832 Chronic kidney disease, stage 3b: Secondary | ICD-10-CM | POA: Diagnosis not present

## 2022-11-08 DIAGNOSIS — N2581 Secondary hyperparathyroidism of renal origin: Secondary | ICD-10-CM | POA: Diagnosis not present

## 2022-11-08 DIAGNOSIS — I129 Hypertensive chronic kidney disease with stage 1 through stage 4 chronic kidney disease, or unspecified chronic kidney disease: Secondary | ICD-10-CM | POA: Diagnosis not present

## 2022-11-09 ENCOUNTER — Ambulatory Visit: Payer: Medicare HMO | Admitting: Physical Therapy

## 2022-11-09 ENCOUNTER — Encounter: Payer: Self-pay | Admitting: Physical Therapy

## 2022-11-09 DIAGNOSIS — M25562 Pain in left knee: Secondary | ICD-10-CM | POA: Diagnosis not present

## 2022-11-09 DIAGNOSIS — M25662 Stiffness of left knee, not elsewhere classified: Secondary | ICD-10-CM

## 2022-11-09 DIAGNOSIS — R600 Localized edema: Secondary | ICD-10-CM

## 2022-11-09 DIAGNOSIS — M6281 Muscle weakness (generalized): Secondary | ICD-10-CM | POA: Diagnosis not present

## 2022-11-09 DIAGNOSIS — R262 Difficulty in walking, not elsewhere classified: Secondary | ICD-10-CM | POA: Diagnosis not present

## 2022-11-09 NOTE — Therapy (Signed)
OUTPATIENT PHYSICAL THERAPY TREATMENT NOTE    Patient Name: Desiree [name] MRN: [id number] DOB:14-May-1951, 72 y.o., female Today's Date: 11/09/2022  END OF SESSION:   PT End of Session - 11/09/22 1444     Visit Number 19    Number of Visits 24    Date for PT Re-Evaluation 11/16/22    Authorization Type additional [employer] visits request at #[id number] on 10/09/22, approved to 11/16/22    Authorization Time Period 10/17-1/12, approved to 11/16/22    Authorization - Number of Visits 24    Progress Note Due on Visit 20    PT Start Time 1440    PT Stop Time 1520    PT Time Calculation (min) 40 min    Activity Tolerance Patient tolerated treatment well    Behavior During Therapy [facility] for tasks assessed/performed                Past Medical History:  Diagnosis Date   Acute gastritis without bleeding 08/08/2018   Anemia    Anxiety    Asthma    Bipolar disorder ([place])    CANDIDIASIS, ESOPHAGEAL 07/28/2009   Qualifier: Diagnosis of  By: [name] MD, [name]    Chronic kidney disease    CKD III   Complication of anesthesia    was told she stopped breathing for one of her finger surgeries   COPD (chronic obstructive pulmonary disease) ([place])    Depression    Diabetes mellitus without complication (HCC)    no meds   Dyspnea    At rest,and with activity   FH: colonic polyps    Fractured elbow    right    GERD (gastroesophageal reflux disease)    Headache    migraines   HH (hiatus hernia)    History of carpal tunnel syndrome    History of chest pain    History of transfusion of packed red blood cells    Hyperlipidemia    Hypertension    Mitral valve prolapse    Neuroleptic-induced tardive dyskinesia    Osteoarthritis of more than one site    Pneumonia    Seasonal allergies    Stroke [facility])    Past Surgical History:  Procedure Laterality Date   AMPUTATION Left 10/14/2018   Procedure: AMPUTATION LEFT LONG FINGER TIP;  Surgeon: Dayna Barker, MD;  Location: [place];  Service:  Plastics;  Laterality: Left;   ANTERIOR CERVICAL DECOMP/DISCECTOMY FUSION  09/27/2016   C5-6 anterior cervical discectomy and fusion, allograft and plate/notes 09/27/2016   ANTERIOR CERVICAL DECOMP/DISCECTOMY FUSION N/A 09/27/2016   Procedure: C5-6 Anterior Cervical Discectomy and Fusion, Allograft and Plate;  Surgeon: [name], MD;  Location: [place];  Service: Orthopedics;  Laterality: N/A;   Back Fusion  2002   BIOPSY  07/19/2018   Procedure: BIOPSY;  Surgeon: [name], MD;  Location: [facility] ENDOSCOPY;  Service: Endoscopy;;   BIOPSY  02/13/2019   Procedure: BIOPSY;  Surgeon: [name], MD;  Location: [facility] ENDOSCOPY;  Service: Endoscopy;;   CARPAL TUNNEL RELEASE  yates   left   COLONOSCOPY N/A 01/05/2014   Procedure: COLONOSCOPY;  Surgeon: [name], MD;  Location: [facility] ENDOSCOPY;  Service: Endoscopy;  Laterality: N/A;   ELBOW SURGERY     age 52   ENTEROSCOPY N/A 02/13/2019   Procedure: ENTEROSCOPY;  Surgeon: [name], MD;  Location: [facility] ENDOSCOPY;  Service: Endoscopy;  Laterality: N/A;   ESOPHAGOGASTRODUODENOSCOPY (EGD) WITH PROPOFOL N/A 07/19/2018   Procedure: ESOPHAGOGASTRODUODENOSCOPY (EGD) WITH PROPOFOL;  Surgeon: [name], MD;  Location: [place];  Service: Endoscopy;  Laterality: N/A;   EXTERNAL EAR SURGERY Left    EYE SURGERY     "removed white dots under eyelid"   FINGER SURGERY Left    INJECTION KNEE Right 07/21/2022   Procedure: INTRA-ARTICULAR INJECTION UNDER ANESTHESIA;  Surgeon: [name], MD;  Location: [place];  Service: Orthopedics;  Laterality: Right;   Juvara osteomy     KNEE SURGERY     NOSE SURGERY     Rt. toe bunion     skin, shave biopsy  05/03/2016   Left occipital scalp, top of scalp   TONSILLECTOMY     TOTAL HIP ARTHROPLASTY Right 12/17/2020   TOTAL HIP ARTHROPLASTY Right 12/17/2020   Procedure: TOTAL HIP ARTHROPLASTY-DIRECT ANTERIOR;  Surgeon: [name], MD;  Location: [place];  Service: Orthopedics;  Laterality: Right;  needs RNFA   TOTAL  KNEE ARTHROPLASTY Left 07/21/2022   Procedure: LEFT TOTAL KNEE ARTHROPLASTY;  Surgeon: [name], MD;  Location: [place];  Service: Orthopedics;  Laterality: Left;   UPPER EXTREMITY ANGIOGRAPHY Bilateral 10/11/2018   Procedure: UPPER EXTREMITY ANGIOGRAPHY;  Surgeon: [name], MD;  Location: [place] [facility];  Service: Cardiovascular;  Laterality: Bilateral;   Patient Active Problem List   Diagnosis Date Noted   History of total knee arthroplasty, left 08/04/2022   Hypertrophic cardiomyopathy ([place]) 07/10/2022   Insomnia secondary to chronic pain 12/27/2020   Insomnia due to other mental disorder 12/27/2020   Snoring 12/27/2020   Inadequate sleep hygiene 12/27/2020   S/P total right hip arthroplasty 12/18/2020   Arthritis of right hip 12/17/2020   Encounter for preoperative assessment 12/10/2020   Infection of nail bed of finger of right hand 09/15/2020   Pain in right finger(s) 08/31/2020   Ganglion of right wrist 08/31/2020   COPD mixed type ([place]) 12/26/2018   Healthcare maintenance 12/26/2018   History of fall 11/05/2018   Weight loss 11/05/2018   Weakness 11/05/2018   Gangrene of finger ([place]) 10/09/2018   Raynaud's phenomenon with gangrene ([place]) 10/09/2018   LVH (left ventricular hypertrophy) 10/09/2018   Vasospasm ([place]) 09/06/2018   Hypotension 08/08/2018   Leucocytosis 08/08/2018   Elevated troponin I level 08/08/2018   Nausea and vomiting 08/06/2018   Pneumonia 07/12/2018   COPD exacerbation ([place]) 07/11/2018   Primary osteoarthritis of both feet 12/21/2017   DDD (degenerative disc disease), lumbar 12/21/2017   Primary osteoarthritis of both knees 12/21/2017   History of bilateral carpal tunnel release 12/21/2017   DDD (degenerative disc disease), cervical 11/15/2017   Former smoker 11/15/2017   Bronchiectasis without complication ([place]) 64/40/3474   Impingement syndrome of right shoulder 07/25/2017   Hx of fusion of cervical spine 07/25/2017   Cervical  spinal stenosis 09/27/2016   Polypharmacy 06/23/2015   Hyperlipidemia 04/19/2015   Visit for preventive health examination 01/01/2015   Primary osteoarthritis involving multiple joints 01/01/2015   Bipolar affective disorder, currently depressed, moderate ([place])    Bipolar I disorder with mania ([place]) 08/17/2014   Hypertension 10/21/2013   Dizziness 03/25/2013   Medication withdrawal ([place]) 03/05/2013   Diabetes mellitus with renal manifestations, controlled ([place]) 11/10/2012   Alopecia areata 02/06/2012   Obesity (BMI 30-39.9) 02/06/2012   Renal insufficiency 07/16/2011   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    Exertional dyspnea 02/07/2011   Dyspnea on exertion 01/19/2011   Diabetes mellitus type 2 with peripheral artery disease ([place]) 04/22/2010   Carpal tunnel syndrome 02/02/2010  CONSTIPATION 02/02/2010   Primary osteoarthritis of both hands 02/02/2010   CATARACTS 04/13/2009   PAIN IN JOINT, ANKLE AND FOOT 09/16/2008   Eosinophilia 07/21/2008   AFFECTIVE DISORDER 04/21/2008   BACK PAIN, CHRONIC 02/12/2008   LEG PAIN 02/12/2008   ABNORMAL INVOLUNTARY MOVEMENTS 12/04/2007   POSTURAL LIGHTHEADEDNESS 11/04/2007   COLONIC POLYPS, HX OF 11/04/2007   Essential hypertension 06/17/2007   HYPERLIPIDEMIA 04/08/2007   GERD 04/08/2007   LOW BACK PAIN SYNDROME 04/08/2007   CHEST PAIN, RECURRENT 04/08/2007     THERAPY DIAG:  Acute pain of left knee  Difficulty in walking, not elsewhere classified  Stiffness of left knee, not elsewhere classified  Localized edema  Muscle weakness (generalized)  PCP: Madelin Headings, MD   REFERRING PROVIDER: Eldred Manges MD   REFERRING DIAG: M17.12 (ICD-10-CM) - Unilateral primary osteoarthritis, left knee   EVAL THERAPY DIAG:  Acute pain of left knee   Stiffness of left knee, not elsewhere classified   Difficulty in walking, not elsewhere classified   Localized edema   Muscle weakness (generalized)   Rationale for Evaluation and  Treatment Rehabilitation   ONSET DATE: 07/21/22   SUBJECTIVE:    SUBJECTIVE STATEMENT:  She said her pain wasn't too bad today. She is still having some cramping and she reported a feeling like when you scrap your knee on the inside.     PERTINENT HISTORY: Lt TKA 07/21/22 PMH: COPD,lumbar DDD and fusion L4-5 2002,,cerv fusion 2017, Rt THA, DM   PAIN:  NPRS scale: 3-4/10; hurts worst with bending Pain location: left knee Pain description: sharp pain when "it draws up and sticks", aching right now  Aggravating factors: nighttime, tightness Relieving factors: pain meds, ice, elevation   PRECAUTIONS: None   WEIGHT BEARING RESTRICTIONS No   FALLS:  Has patient fallen in last 6 months? No   LIVING ENVIRONMENT: Lives with: lives with their family Lives in: House/apartment Stairs: No Has following equipment at home: [name] - 4 wheeled, RW, st cane, grab bars in bathroom. Raised toilet seat,  Pt stating her shower chair doesn't  fit properly.    OCCUPATION: retired   PLOF: Independent   PATIENT GOALS walk without walker, stop hurting     OBJECTIVE:     PATIENT SURVEYS:  10/02/22: FOTO 60% (visit 10) 08/31/2022 FOTO 47 (was risk adjusted 39, Goal 55) 08/08/22: FOTO intake: 51%  predicted:  55%   COGNITION: 08/08/22 Overall cognitive status: WFL               SENSATION: 08/08/22: WFL   EDEMA:  08/08/22 Rt knee circumferential: 37 centimeters                 Left knee circumferential: 45 centimeters   MUSCLE LENGTH: Hamstrings: Right 50 deg; Left 68 deg     POSTURE: rounded shoulders and forward head   PALPATION: TTP: lateral joint line and around incision site, pt also    LOWER EXTREMITY STRENGTH:   MMT Right 08/08/22 Left 08/08/22 R/L 09/28/2022 Left 10/09/22  Hip flexion 5/5 4/5 4+ 4+  Hip extension        Hip abduction        Hip adduction        Hip internal rotation        Hip external rotation        Knee flexion 5/5 3/5 5/3+ 4+  Knee  extension 5/5 3/5 5/3 4  Ankle dorsiflexion 5/5 5/5 5/5   Ankle plantarflexion 5/5  5/5 5/5   Ankle inversion        Ankle eversion         (Blank rows = not tested)   LOWER EXTREMITY ROM:   ROM A=active P=passive Right 08/08/22 supine Left 08/08/22 supine Left 08/09/22 Supine Left 08/16/22 Left 08/22/22 supine Left 08/29/22 supine Left 08/31/2022 Left 09/13/2022 Left 09/22/22 Left 09/26/22 Left 10/02/22 Left 10/09/22 Left 10/11/22 Left 10/24/22 Left 11/06/22  Hip flexion                   Hip extension                   Knee flexion A: 128   A: 70 P: 80   A: 77 P: 85 P: 95 A: 85 P: 94 A:90 Active supine 92 AROM in supine heel slide 95 PROM sitting 93, AROM 88 sitting  P: 100 A: 90 Supine  P: 98  Sitting P:110 Sitting P: 112 Supine A: 112 Supine A: 110 P: 114  Knee extension A: 0   A: -8 P: -5 A: -2 edema limited A: -12 (seated LAQ) A:-10 P: -8 A: -8 Active supine -2 Seated AROM LAQ : -4  Active quad set supine: -2  A: -2 A: -2       (Blank rows = not tested)      GAIT: 1/11/202: stiff gait with decreased knee flexion in swing phase and decreased stance time on left LE  09/13/2022 : independent ambulation into clinic c decreased stance, lacking full TKE in stance on Lt leg  08/08/2022 Distance walked: 50 feet  Assistive device utilized: Walker - 4 wheeled Level of assistance: Modified independence Comments: antalgic gait pattern     TODAY'S TREATMENT: 11/09/22: TherEx:  Stationary bike 6 min no resistance Standing slant board calf stretch x 2 holding 30 sec Knee flexion Lunge stretch with foot on treadmill x10 holding for 5 sec Leg press DL 78# [id number], left leg only 25# 2X10 Step ups with UE support on 4 inch x15 Lateral step ups with UE support on 4 inch step x15 Sit to stands slow down x10 Seated assisted knee flexion with other leg x15 holding for 5 sec  Neuromuscular Re-edu:  Tandem walking: in parallel bars x3 round trips with finger tap  support  Forward/backward walking in parallel bars x3 round trips with finger tap support  Forward walking with head nods in parallel bars x3 round trips with finger tap support  SLS in parallel bars hold for 10s x3 each leg    11/06/22:  TherEx:  Sci fit bike seat #6 X 7 min Standing slant board calf stretch x 2 holding 30 sec Leg press DL 01# [id number], left leg only 25# 2X10 Standing hip extension: 2 x 10 each LE c UE support Step ups on 6 inch step x 10 Lateral step ups on 6 inch step x 15 Neuromuscular Re-edu:  Tandem walking: in parallel bars x 8 with finger tap support Sidestepping over cones lying on their side x 6 both directions   Stepping over cones lying on their side x 6 both directions.  11/02/22:  TherEx:  Sci fit bike seat # 9 X 9 min Standing slant board calf stretch x 2 holding 30 sec Knee flexion Lunge stretch with foot on treadmill x10 holding for 5 sec Leg press DL 25# [id number], left leg only 25# 2X10 Step ups on 4 inch step x 10 Lateral step ups on 4 inch step x  10 Sit to stands x10 Assisted knee flexion with strap x10 holding for 5 sec    PATIENT EDUCATION:  Education details: water intake to help with cramping  Person educated: Patient Education method: Programmer, multimedia, Demonstration, Verbal cues, and Handouts Education comprehension: verbalized understanding, returned demonstration, and verbal cues required    HOME EXERCISE PROGRAM: Access Code: [email or web address] URL: [email or web address] Date: 10/24/2022 Prepared by: Narda Amber  Exercises - Seated Long Arc Quad  - 3-4 x daily - 7 x weekly - 2 sets - 10 reps - 3-5 seconds hold - Sit to Stand with Counter Support  - 3-4 x daily - 7 x weekly - 2 sets - 10 reps - Seated Hamstring Stretch  - 3-4 x daily - 7 x weekly - 3 reps - 30 seconds hold - Supine Heel Slide with Strap  - 3-4 x daily - 7 x weekly - 5 reps - 10 seconds hold - Heel Raises with Counter Support  - 2-3 x daily - 7 x weekly - 15 reps -  Supine Bridge  - 2 x daily - 7 x weekly - 2 sets - 10 reps - 5 seconds hold   ASSESSMENT:   CLINICAL IMPRESSION: She showed good tolerance to additional balance exercises today, but she could still benefit from more balance training to improve overall function. She is still having difficulties with knee flexion and was encouraged to continue her knee flexion stretches at home. She will continue to benefit from skilled PT to improve her overall strength and other limitations mentioned above.    OBJECTIVE IMPAIRMENTS Abnormal gait, decreased balance, decreased mobility, difficulty walking, decreased ROM, decreased strength, increased edema, and pain.    ACTIVITY LIMITATIONS bending, sitting, standing, sleeping, stairs, transfers, bed mobility, toileting, and dressing   PARTICIPATION LIMITATIONS: cleaning, laundry, shopping, and community activity   PERSONAL FACTORS 3+ comorbidities: see above  are also affecting patient's functional outcome.    REHAB POTENTIAL: Good   CLINICAL DECISION MAKING: Stable/uncomplicated   EVALUATION COMPLEXITY: Low     GOALS: Goals reviewed with patient? Yes   Short term PT Goals (target date for Short term goals are 3 weeks 09/01/22) Patient will demonstrate independent use of home exercise program to maintain progress from in clinic treatments. Goal status: MET 08/29/22   2. Pt will be able to improve her left knee flexion to >/= 90 degrees actively Goal Status: MET 08/29/22   Long term PT goals (target dates for all long term goals are 12 weeks  11/16/22 )   1. Patient will demonstrate/report pain at worst less than or equal to 2/10 to facilitate minimal limitation in daily activity secondary to pain symptoms. Goal status: On Going 10/24/22   2. Patient will demonstrate independent use of home exercise program to facilitate ability to maintain/progress functional gains from skilled physical therapy services. MET 10/09/22   3. Patient will demonstrate  FOTO outcome > or = 55 % to indicate reduced disability due to condition. A. Goal status: On Going 10/24/22   4.  Patient will demonstrate left LE MMT 5/5 throughout to faciltiate usual transfers, stairs, squatting at [facility] for daily life.    A. Goal status: On Going 10/24/22  5.  Pt will improve her 5 time sit to stand to </= 15 seconds c/s UE support.    Goal status: On Going 10/24/22   6.  Pt will be able to navigate curb step with/without assistive device safely.   Goal status: MET 10/09/22    7.  Pt will improve her left knee flexion to >/= 115 degrees for improved functional mobility and safely.  A. Goal Status: On Going 10/24/22         PLAN:   PT FREQUENCY: 2x/week   PT DURATION: 12 weeks   PLANNED INTERVENTIONS: Therapeutic exercises, Therapeutic activity, Neuro Muscular re-education, Balance training, Gait training, Patient/Family education, Joint mobilization, Stair training, DME instructions, Dry Needling, Electrical stimulation, Traction, Cryotherapy, Moist heat, Taping, Ultrasound, Ionotophoresis 4mg /ml Dexamethasone, and Manual therapy.  All included unless contraindicated     PLAN FOR NEXT SESSION:  Progress note needed. Continue with strength and ROM especially knee flexion. More balance training    Aleana Fifita, Student-PT 11/09/22 3:36 PM

## 2022-11-13 ENCOUNTER — Encounter: Payer: Self-pay | Admitting: Physical Therapy

## 2022-11-13 ENCOUNTER — Ambulatory Visit (INDEPENDENT_AMBULATORY_CARE_PROVIDER_SITE_OTHER): Payer: Medicare HMO | Admitting: Physical Therapy

## 2022-11-13 DIAGNOSIS — M6281 Muscle weakness (generalized): Secondary | ICD-10-CM

## 2022-11-13 DIAGNOSIS — R600 Localized edema: Secondary | ICD-10-CM | POA: Diagnosis not present

## 2022-11-13 DIAGNOSIS — M25662 Stiffness of left knee, not elsewhere classified: Secondary | ICD-10-CM

## 2022-11-13 DIAGNOSIS — M25562 Pain in left knee: Secondary | ICD-10-CM | POA: Diagnosis not present

## 2022-11-13 DIAGNOSIS — R262 Difficulty in walking, not elsewhere classified: Secondary | ICD-10-CM | POA: Diagnosis not present

## 2022-11-13 NOTE — Therapy (Signed)
OUTPATIENT PHYSICAL THERAPY TREATMENT NOTE/DISCHARGE   PHYSICAL THERAPY DISCHARGE SUMMARY  Visits from Start of Care: 20  Current functional level related to goals / functional outcomes: See below   Remaining deficits: See below   Education / Equipment: HEP  Plan:  Patient goals were mostly met. Patient is being discharged due to meeting treatment goals and she feels ready to discharge.     Progress Note reporting period 09/28/22 to 11/13/22  See below for objective and subjective measurements relating to patients progress with PT.   Patient Name: Desiree [name] MRN: [id number] DOB:May 22, 1951, 72 y.o., female Today's Date: 11/13/2022  END OF SESSION:   PT End of Session - 11/13/22 1508     Visit Number 20    Number of Visits 24    Date for PT Re-Evaluation 11/16/22    Authorization Type additional [employer] visits request at #[id number] on 10/09/22, approved to 11/16/22    Authorization Time Period 10/17-1/12, approved to 11/16/22    Authorization - Number of Visits 24    Progress Note Due on Visit 64    PT Start Time 8756    PT Stop Time 1505    PT Time Calculation (min) 40 min    Activity Tolerance Patient tolerated treatment well    Behavior During Therapy [facility] for tasks assessed/performed                 Past Medical History:  Diagnosis Date   Acute gastritis without bleeding 08/08/2018   Anemia    Anxiety    Asthma    Bipolar disorder ([place])    CANDIDIASIS, ESOPHAGEAL 07/28/2009   Qualifier: Diagnosis of  By: [name] MD, [name]    Chronic kidney disease    CKD III   Complication of anesthesia    was told she stopped breathing for one of her finger surgeries   COPD (chronic obstructive pulmonary disease) ([place])    Depression    Diabetes mellitus without complication (HCC)    no meds   Dyspnea    At rest,and with activity   FH: colonic polyps    Fractured elbow    right    GERD (gastroesophageal reflux disease)    Headache    migraines   HH (hiatus  hernia)    History of carpal tunnel syndrome    History of chest pain    History of transfusion of packed red blood cells    Hyperlipidemia    Hypertension    Mitral valve prolapse    Neuroleptic-induced tardive dyskinesia    Osteoarthritis of more than one site    Pneumonia    Seasonal allergies    Stroke [facility])    Past Surgical History:  Procedure Laterality Date   AMPUTATION Left 10/14/2018   Procedure: AMPUTATION LEFT LONG FINGER TIP;  Surgeon: Dayna Barker, MD;  Location: [place];  Service: Plastics;  Laterality: Left;   ANTERIOR CERVICAL DECOMP/DISCECTOMY FUSION  09/27/2016   C5-6 anterior cervical discectomy and fusion, allograft and plate/notes 09/27/2016   ANTERIOR CERVICAL DECOMP/DISCECTOMY FUSION N/A 09/27/2016   Procedure: C5-6 Anterior Cervical Discectomy and Fusion, Allograft and Plate;  Surgeon: [name], MD;  Location: [place];  Service: Orthopedics;  Laterality: N/A;   Back Fusion  2002   BIOPSY  07/19/2018   Procedure: BIOPSY;  Surgeon: [name], MD;  Location: [facility] ENDOSCOPY;  Service: Endoscopy;;   BIOPSY  02/13/2019   Procedure: BIOPSY;  Surgeon: [name], MD;  Location: [facility] ENDOSCOPY;  Service:  Endoscopy;;   CARPAL TUNNEL RELEASE  yates   left   COLONOSCOPY N/A 01/05/2014   Procedure: COLONOSCOPY;  Surgeon: [name], MD;  Location: [facility] ENDOSCOPY;  Service: Endoscopy;  Laterality: N/A;   ELBOW SURGERY     age 15   ENTEROSCOPY N/A 02/13/2019   Procedure: ENTEROSCOPY;  Surgeon: [name], MD;  Location: [facility] ENDOSCOPY;  Service: Endoscopy;  Laterality: N/A;   ESOPHAGOGASTRODUODENOSCOPY (EGD) WITH PROPOFOL N/A 07/19/2018   Procedure: ESOPHAGOGASTRODUODENOSCOPY (EGD) WITH PROPOFOL;  Surgeon: [name], MD;  Location: [place];  Service: Endoscopy;  Laterality: N/A;   EXTERNAL EAR SURGERY Left    EYE SURGERY     "removed white dots under eyelid"   FINGER SURGERY Left    INJECTION KNEE Right 07/21/2022   Procedure: INTRA-ARTICULAR INJECTION UNDER  ANESTHESIA;  Surgeon: [name], MD;  Location: [place];  Service: Orthopedics;  Laterality: Right;   Juvara osteomy     KNEE SURGERY     NOSE SURGERY     Rt. toe bunion     skin, shave biopsy  05/03/2016   Left occipital scalp, top of scalp   TONSILLECTOMY     TOTAL HIP ARTHROPLASTY Right 12/17/2020   TOTAL HIP ARTHROPLASTY Right 12/17/2020   Procedure: TOTAL HIP ARTHROPLASTY-DIRECT ANTERIOR;  Surgeon: [name], MD;  Location: [place];  Service: Orthopedics;  Laterality: Right;  needs RNFA   TOTAL KNEE ARTHROPLASTY Left 07/21/2022   Procedure: LEFT TOTAL KNEE ARTHROPLASTY;  Surgeon: [name], MD;  Location: [place];  Service: Orthopedics;  Laterality: Left;   UPPER EXTREMITY ANGIOGRAPHY Bilateral 10/11/2018   Procedure: UPPER EXTREMITY ANGIOGRAPHY;  Surgeon: [name], MD;  Location: [place] CV LAB;  Service: Cardiovascular;  Laterality: Bilateral;   Patient Active Problem List   Diagnosis Date Noted   History of total knee arthroplasty, left 08/04/2022   Hypertrophic cardiomyopathy ([place]) 07/10/2022   Insomnia secondary to chronic pain 12/27/2020   Insomnia due to other mental disorder 12/27/2020   Snoring 12/27/2020   Inadequate sleep hygiene 12/27/2020   S/P total right hip arthroplasty 12/18/2020   Arthritis of right hip 12/17/2020   Encounter for preoperative assessment 12/10/2020   Infection of nail bed of finger of right hand 09/15/2020   Pain in right finger(s) 08/31/2020   Ganglion of right wrist 08/31/2020   COPD mixed type ([place]) 12/26/2018   Healthcare maintenance 12/26/2018   History of fall 11/05/2018   Weight loss 11/05/2018   Weakness 11/05/2018   Gangrene of finger ([place]) 10/09/2018   Raynaud's phenomenon with gangrene ([place]) 10/09/2018   LVH (left ventricular hypertrophy) 10/09/2018   Vasospasm ([place]) 09/06/2018   Hypotension 08/08/2018   Leucocytosis 08/08/2018   Elevated troponin I level 08/08/2018   Nausea and vomiting 08/06/2018    Pneumonia 07/12/2018   COPD exacerbation ([place]) 07/11/2018   Primary osteoarthritis of both feet 12/21/2017   DDD (degenerative disc disease), lumbar 12/21/2017   Primary osteoarthritis of both knees 12/21/2017   History of bilateral carpal tunnel release 12/21/2017   DDD (degenerative disc disease), cervical 11/15/2017   Former smoker 11/15/2017   Bronchiectasis without complication ([place]) 01/74/9449   Impingement syndrome of right shoulder 07/25/2017   Hx of fusion of cervical spine 07/25/2017   Cervical spinal stenosis 09/27/2016   Polypharmacy 06/23/2015   Hyperlipidemia 04/19/2015   Visit for preventive health examination 01/01/2015   Primary osteoarthritis involving multiple joints 01/01/2015   Bipolar affective disorder, currently depressed, moderate ([place])  Bipolar I disorder with mania ([place]) 08/17/2014   Hypertension 10/21/2013   Dizziness 03/25/2013   Medication withdrawal ([place]) 03/05/2013   Diabetes mellitus with renal manifestations, controlled ([place]) 11/10/2012   Alopecia areata 02/06/2012   Obesity (BMI 30-39.9) 02/06/2012   Renal insufficiency 07/16/2011   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    Exertional dyspnea 02/07/2011   Dyspnea on exertion 01/19/2011   Diabetes mellitus type 2 with peripheral artery disease ([place]) 04/22/2010   Carpal tunnel syndrome 02/02/2010   CONSTIPATION 02/02/2010   Primary osteoarthritis of both hands 02/02/2010   CATARACTS 04/13/2009   PAIN IN JOINT, ANKLE AND FOOT 09/16/2008   Eosinophilia 07/21/2008   AFFECTIVE DISORDER 04/21/2008   BACK PAIN, CHRONIC 02/12/2008   LEG PAIN 02/12/2008   ABNORMAL INVOLUNTARY MOVEMENTS 12/04/2007   POSTURAL LIGHTHEADEDNESS 11/04/2007   COLONIC POLYPS, HX OF 11/04/2007   Essential hypertension 06/17/2007   HYPERLIPIDEMIA 04/08/2007   GERD 04/08/2007   LOW BACK PAIN SYNDROME 04/08/2007   CHEST PAIN, RECURRENT 04/08/2007     THERAPY DIAG:  Acute pain of left knee  Difficulty in walking, not  elsewhere classified  Stiffness of left knee, not elsewhere classified  Localized edema  Muscle weakness (generalized)  PCP: [name], MD   REFERRING PROVIDER: [name] MD   REFERRING DIAG: M17.12 (ICD-10-CM) - Unilateral primary osteoarthritis, left knee   EVAL THERAPY DIAG:  Acute pain of left knee   Stiffness of left knee, not elsewhere classified   Difficulty in walking, not elsewhere classified   Localized edema   Muscle weakness (generalized)   Rationale for Evaluation and Treatment Rehabilitation   ONSET DATE: 07/21/22   SUBJECTIVE:    SUBJECTIVE STATEMENT:  She said she is not really having any pain today. She is still waking up in the night with some pain and cramping, but these occurrences have decreased.    PERTINENT HISTORY: Lt TKA 07/21/22 PMH: COPD,lumbar DDD and fusion L4-5 2002,,cerv fusion 2017, Rt THA, DM   PAIN:  NPRS scale: no pain Pain location: left knee Pain description: sharp pain when "it draws up and sticks", aching right now  Aggravating factors: nighttime, tightness Relieving factors: pain meds, ice, elevation   PRECAUTIONS: None   WEIGHT BEARING RESTRICTIONS No   FALLS:  Has patient fallen in last 6 months? No   LIVING ENVIRONMENT: Lives with: lives with their family Lives in: House/apartment Stairs: No Has following equipment at home: Gilford Rile - 4 wheeled, RW, st cane, grab bars in bathroom. Raised toilet seat,  Pt stating her shower chair doesn't  fit properly.    OCCUPATION: retired   PLOF: Independent   PATIENT GOALS walk without walker, stop hurting     OBJECTIVE:     PATIENT SURVEYS:  10/02/22: FOTO 60% (visit 10) 08/31/2022 FOTO 47 (was risk adjusted 39, Goal 55) 08/08/22: FOTO intake: 51%  predicted:  55% 11/13/22: FOTO: 62% met goal for this    COGNITION: 08/08/22 Overall cognitive status: WFL               SENSATION: 08/08/22: WFL   EDEMA:  08/08/22 Rt knee circumferential: 37 centimeters                  Left knee circumferential: 45 centimeters   MUSCLE LENGTH: Hamstrings: Right 50 deg; Left 68 deg     POSTURE: rounded shoulders and forward head   PALPATION: TTP: lateral joint line and around incision site, pt also    LOWER EXTREMITY STRENGTH:  MMT Right 08/08/22 Left 08/08/22 R/L 09/28/2022 Left 10/09/22 Left 11/13/22  Hip flexion 5/5 4/5 4+ 4+ 4+  Hip extension         Hip abduction         Hip adduction         Hip internal rotation         Hip external rotation         Knee flexion 5/5 3/5 5/3+ 4+ 5  Knee extension 5/5 3/5 5/3 4 4+  Ankle dorsiflexion 5/5 5/5 5/5    Ankle plantarflexion 5/5 5/5 5/5    Ankle inversion         Ankle eversion          (Blank rows = not tested)   LOWER EXTREMITY ROM:   ROM A=active P=passive Right 08/08/22 supine Left 08/08/22 supine Left 08/09/22 Supine Left 08/16/22 Left 08/22/22 supine Left 08/29/22 supine Left 08/31/2022 Left 09/13/2022 Left 09/22/22 Left 09/26/22 Left 10/02/22 Left 10/09/22 Left 10/11/22 Left 10/24/22 Left 11/06/22 Left 11/13/22  Hip flexion                    Hip extension                    Knee flexion A: 128   A: 70 P: 80   A: 77 P: 85 P: 95 A: 85 P: 94 A:90 Active supine 92 AROM in supine heel slide 95 PROM sitting 93, AROM 88 sitting  P: 100 A: 90 Supine  P: 98  Sitting P:110 Sitting P: 112 Supine A: 112 Supine A: 110 P: 114 Supine: A:100 P: 108   Knee extension A: 0   A: -8 P: -5 A: -2 edema limited A: -12 (seated LAQ) A:-10 P: -8 A: -8 Active supine -2 Seated AROM LAQ : -4  Active quad set supine: -2  A: -2 A: -2     A: -2    (Blank rows = not tested)      GAIT: 1/11/202: stiff gait with decreased knee flexion in swing phase and decreased stance time on left LE  09/13/2022 : independent ambulation into clinic c decreased stance, lacking full TKE in stance on Lt leg  08/08/2022 Distance walked: 50 feet  Assistive device utilized: Walker - 4 wheeled Level  of assistance: Modified independence Comments: antalgic gait pattern     TODAY'S TREATMENT: 11/13/22: TherEx:  Sci fit bike level 4 x41min Leg press DL 81# 2X10, left leg only 25# 2X10 Step ups with UE support on 6 inch x15 Lateral step ups with UE support on 6 inch step x15 Sit to stands slow down x10 Mini squats at countertop with UE support 2x10 Neuromuscular Re-edu:  SLS in parallel bars hold for 10s x3 each leg  Updated measurements    PATIENT EDUCATION:  Education details: water intake to help with cramping  Person educated: Patient Education method: Consulting civil engineer, Demonstration, Verbal cues, and Handouts Education comprehension: verbalized understanding, returned demonstration, and verbal cues required    HOME EXERCISE PROGRAM: Access Code: [email or web address] URL: [email or web address] Date: 10/24/2022 Prepared by: Kearney Hard  Exercises - Seated Long Arc Quad  - 3-4 x daily - 7 x weekly - 2 sets - 10 reps - 3-5 seconds hold - Sit to Stand with Counter Support  - 3-4 x daily - 7 x weekly - 2 sets - 10 reps - Seated Hamstring Stretch  - 3-4 x daily - 7 x weekly - 3 reps -  30 seconds hold - Supine Heel Slide with Strap  - 3-4 x daily - 7 x weekly - 5 reps - 10 seconds hold - Heel Raises with Counter Support  - 2-3 x daily - 7 x weekly - 15 reps - Supine Bridge  - 2 x daily - 7 x weekly - 2 sets - 10 reps - 5 seconds hold   ASSESSMENT:   CLINICAL IMPRESSION: She has shown good progress and has met most of her goals except her knee flexion with a little stiffness today. She is at a good functional level and will be discharged to HEP today. She had no further questions and was encouraged to continue working on her knee flexion stretches.    OBJECTIVE IMPAIRMENTS Abnormal gait, decreased balance, decreased mobility, difficulty walking, decreased ROM, decreased strength, increased edema, and pain.    ACTIVITY LIMITATIONS bending, sitting, standing, sleeping,  stairs, transfers, bed mobility, toileting, and dressing   PARTICIPATION LIMITATIONS: cleaning, laundry, shopping, and community activity   PERSONAL FACTORS 3+ comorbidities: see above  are also affecting patient's functional outcome.    REHAB POTENTIAL: Good   CLINICAL DECISION MAKING: Stable/uncomplicated   EVALUATION COMPLEXITY: Low     GOALS: Goals reviewed with patient? Yes   Short term PT Goals (target date for Short term goals are 3 weeks 09/01/22) Patient will demonstrate independent use of home exercise program to maintain progress from in clinic treatments. Goal status: MET 08/29/22   2. Pt will be able to improve her left knee flexion to >/= 90 degrees actively Goal Status: MET 08/29/22   Long term PT goals (target dates for all long term goals are 12 weeks  11/16/22 )   1. Patient will demonstrate/report pain at worst less than or equal to 2/10 to facilitate minimal limitation in daily activity secondary to pain symptoms. Goal status: MET 11/13/22   2. Patient will demonstrate independent use of home exercise program to facilitate ability to maintain/progress functional gains from skilled physical therapy services. MET 10/09/22   3. Patient will demonstrate FOTO outcome > or = 55 % to indicate reduced disability due to condition. A. Goal status: MET 11/13/22 - score: 62%   4.  Patient will demonstrate left LE MMT 5/5 throughout to faciltiate usual transfers, stairs, squatting at [facility] for daily life.    A. Goal status: MET 11/13/22  5.  Pt will improve her 5 time sit to stand to </= 15 seconds c/s UE support.    Goal status: MET 11/13/22   6.  Pt will be able to navigate curb step with/without assistive device safely.   Goal status: MET 10/09/22    7. Pt will improve her left knee flexion to >/= 115 degrees for improved functional mobility and safely.  A. Goal Status: Mostly met had 114 last time checked         PLAN:   PT FREQUENCY: 2x/week   PT DURATION:  12 weeks   PLANNED INTERVENTIONS: Therapeutic exercises, Therapeutic activity, Neuro Muscular re-education, Balance training, Gait training, Patient/Family education, Joint mobilization, Stair training, DME instructions, Dry Needling, Electrical stimulation, Traction, Cryotherapy, Moist heat, Taping, Ultrasound, Ionotophoresis 4mg /ml Dexamethasone, and Manual therapy.  All included unless contraindicated     PLAN FOR NEXT SESSION:  Discharged today   Mir Fullilove, Student-PT 11/13/22 3:09 PM

## 2022-11-16 ENCOUNTER — Encounter: Payer: Medicare HMO | Admitting: Physical Therapy

## 2022-11-21 ENCOUNTER — Encounter: Payer: Medicare HMO | Admitting: Orthopaedic Surgery

## 2022-11-29 ENCOUNTER — Other Ambulatory Visit: Payer: Self-pay | Admitting: Internal Medicine

## 2022-12-13 ENCOUNTER — Ambulatory Visit (INDEPENDENT_AMBULATORY_CARE_PROVIDER_SITE_OTHER): Payer: Medicare HMO

## 2022-12-13 DIAGNOSIS — L84 Corns and callosities: Secondary | ICD-10-CM | POA: Diagnosis not present

## 2022-12-13 DIAGNOSIS — E1151 Type 2 diabetes mellitus with diabetic peripheral angiopathy without gangrene: Secondary | ICD-10-CM | POA: Diagnosis not present

## 2022-12-13 DIAGNOSIS — M2041 Other hammer toe(s) (acquired), right foot: Secondary | ICD-10-CM | POA: Diagnosis not present

## 2022-12-13 DIAGNOSIS — M2011 Hallux valgus (acquired), right foot: Secondary | ICD-10-CM | POA: Diagnosis not present

## 2022-12-13 DIAGNOSIS — M21611 Bunion of right foot: Secondary | ICD-10-CM

## 2022-12-13 NOTE — Progress Notes (Signed)
Patient presents today to pick up diabetic insoles.  Patient was dispensed  3 pairs of foam casted diabetic insoles.   She tried on the shoes with the insoles and the fit was satisfactory.   Will follow up next year for new order.

## 2022-12-15 ENCOUNTER — Ambulatory Visit: Payer: Medicare HMO | Admitting: Orthopaedic Surgery

## 2022-12-15 VITALS — BP 93/59 | HR 89 | Ht 62.0 in | Wt 155.0 lb

## 2022-12-15 DIAGNOSIS — M6281 Muscle weakness (generalized): Secondary | ICD-10-CM | POA: Diagnosis not present

## 2022-12-15 DIAGNOSIS — Z96652 Presence of left artificial knee joint: Secondary | ICD-10-CM

## 2022-12-15 NOTE — Progress Notes (Signed)
Office Visit Note   Patient: Desiree [name]           Date of Birth: May 21, 1951           MRN: [id number] Visit Date: 12/15/2022              Requested by: [name], MD [place],  [place] [address] PCP: [name], MD   Assessment & Plan: Visit Diagnoses:  1. History of total knee arthroplasty, left   2. Quadriceps weakness     Plan: We went over multiple exercises she is going to do on her own to improve her quad strength and improve her gait and community ambulation.  Follow-up as needed.  She understands that when her quad gets strong she will have good leg function.  She has been through 2 rounds of therapy and can work on this on her own.  Follow-Up Instructions: No follow-ups on file.   Orders:  No orders of the defined types were placed in this encounter.  No orders of the defined types were placed in this encounter.     Procedures: No procedures performed   Clinical Data: No additional findings.   Subjective: Chief Complaint  Patient presents with   Left Knee - Follow-up    HPI post total knee arthroplasty left September 2023.  Still ambulating with a quad weak gait sometimes when she for stands up she hyperextends.  Good range of motion quad atrophy present she still has a hop step when she tries to get up on the single step in the office and flexes at the waist and has to push up with her hands.  Opposite right quad is strong.  Review of Systems unchanged.  Of note his bipolar disorder type 2 diabetes.   Objective: Vital Signs: BP (!) 93/59   Pulse 89   Ht '5\' 2"'$  (1.575 m)   Wt 155 lb (70.3 kg)   BMI 28.35 kg/m   Physical Exam Constitutional:      Appearance: She is well-developed.  HENT:     Head: Normocephalic.     Right Ear: External ear normal.     Left Ear: External ear normal. There is no impacted cerumen.  Eyes:     Pupils: Pupils are equal, round, and reactive to light.  Neck:     Thyroid: No  thyromegaly.     Trachea: No tracheal deviation.  Cardiovascular:     Rate and Rhythm: Normal rate.  Pulmonary:     Effort: Pulmonary effort is normal.  Abdominal:     Palpations: Abdomen is soft.  Musculoskeletal:     Cervical back: No rigidity.  Skin:    General: Skin is warm and dry.  Neurological:     Mental Status: She is alert and oriented to person, place, and time.  Psychiatric:        Behavior: Behavior normal.     Ortho Exam good knee range of motion.  She has no extension lag quad weakness I can overcome with 3 fingers at the ankle.  Hop step going from exam floor up to single step left foot first.  Collateral ligaments are stable no knee effusion.  Specialty Comments:  No specialty comments available.  Imaging: No results found.   PMFS History: Patient Active Problem List   Diagnosis Date Noted   Quadriceps weakness 12/15/2022   History of total knee arthroplasty, left 08/04/2022   Hypertrophic cardiomyopathy ([place]) 07/10/2022   Insomnia secondary  to chronic pain 12/27/2020   Insomnia due to other mental disorder 12/27/2020   Snoring 12/27/2020   Inadequate sleep hygiene 12/27/2020   S/P total right hip arthroplasty 12/18/2020   Arthritis of right hip 12/17/2020   Encounter for preoperative assessment 12/10/2020   Infection of nail bed of finger of right hand 09/15/2020   Pain in right finger(s) 08/31/2020   Ganglion of right wrist 08/31/2020   COPD mixed type ([place]) 12/26/2018   Healthcare maintenance 12/26/2018   History of fall 11/05/2018   Weight loss 11/05/2018   Weakness 11/05/2018   Gangrene of finger ([place]) 10/09/2018   Raynaud's phenomenon with gangrene ([place]) 10/09/2018   LVH (left ventricular hypertrophy) 10/09/2018   Vasospasm ([place]) 09/06/2018   Hypotension 08/08/2018   Leucocytosis 08/08/2018   Elevated troponin I level 08/08/2018   Nausea and vomiting 08/06/2018   Pneumonia 07/12/2018   COPD exacerbation ([place]) 07/11/2018   Primary  osteoarthritis of both feet 12/21/2017   DDD (degenerative disc disease), lumbar 12/21/2017   Primary osteoarthritis of both knees 12/21/2017   History of bilateral carpal tunnel release 12/21/2017   DDD (degenerative disc disease), cervical 11/15/2017   Former smoker 11/15/2017   Bronchiectasis without complication ([place]) [id number]   Impingement syndrome of right shoulder 07/25/2017   Hx of fusion of cervical spine 07/25/2017   Cervical spinal stenosis 09/27/2016   Polypharmacy 06/23/2015   Hyperlipidemia 04/19/2015   Visit for preventive health examination 01/01/2015   Primary osteoarthritis involving multiple joints 01/01/2015   Bipolar affective disorder, currently depressed, moderate ([place])    Bipolar I disorder with mania ([place]) 08/17/2014   Hypertension 10/21/2013   Dizziness 03/25/2013   Medication withdrawal ([place]) 03/05/2013   Diabetes mellitus with renal manifestations, controlled ([place]) 11/10/2012   Alopecia areata 02/06/2012   Obesity (BMI 30-39.9) 02/06/2012   Renal insufficiency 07/16/2011   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    Exertional dyspnea 02/07/2011   Dyspnea on exertion 01/19/2011   Diabetes mellitus type 2 with peripheral artery disease ([place]) 04/22/2010   Carpal tunnel syndrome 02/02/2010   CONSTIPATION 02/02/2010   Primary osteoarthritis of both hands 02/02/2010   CATARACTS 04/13/2009   PAIN IN JOINT, ANKLE AND FOOT 09/16/2008   Eosinophilia 07/21/2008   AFFECTIVE DISORDER 04/21/2008   BACK PAIN, CHRONIC 02/12/2008   LEG PAIN 02/12/2008   ABNORMAL INVOLUNTARY MOVEMENTS 12/04/2007   POSTURAL LIGHTHEADEDNESS 11/04/2007   COLONIC POLYPS, HX OF 11/04/2007   Essential hypertension 06/17/2007   HYPERLIPIDEMIA 04/08/2007   GERD 04/08/2007   LOW BACK PAIN SYNDROME 04/08/2007   CHEST PAIN, RECURRENT 04/08/2007   Past Medical History:  Diagnosis Date   Acute gastritis without bleeding 08/08/2018   Anemia    Anxiety    Asthma    Bipolar disorder ([place])     CANDIDIASIS, ESOPHAGEAL 07/28/2009   Qualifier: Diagnosis of  By: [name] MD, [name]    Chronic kidney disease    CKD III   Complication of anesthesia    was told she stopped breathing for one of her finger surgeries   COPD (chronic obstructive pulmonary disease) ([place])    Depression    Diabetes mellitus without complication (HCC)    no meds   Dyspnea    At rest,and with activity   FH: colonic polyps    Fractured elbow    right    GERD (gastroesophageal reflux disease)    Headache    migraines   HH (hiatus hernia)    History of carpal tunnel syndrome  History of chest pain    History of transfusion of packed red blood cells    Hyperlipidemia    Hypertension    Mitral valve prolapse    Neuroleptic-induced tardive dyskinesia    Osteoarthritis of more than one site    Pneumonia    Seasonal allergies    Stroke [facility])     Family History  Problem Relation Age of Onset   Diabetes Mother    Hypertension Mother    Heart attack Mother    Heart attack Father    Heart disease Father    Throat cancer Brother    Diabetes type II Brother    Heart disease Brother    Lung cancer Brother    Lung cancer Paternal Uncle    Lung cancer Daughter    Breast cancer Cousin     Past Surgical History:  Procedure Laterality Date   AMPUTATION Left 10/14/2018   Procedure: AMPUTATION LEFT LONG FINGER TIP;  Surgeon: Dayna Barker, MD;  Location: [place];  Service: Plastics;  Laterality: Left;   ANTERIOR CERVICAL DECOMP/DISCECTOMY FUSION  09/27/2016   C5-6 anterior cervical discectomy and fusion, allograft and plate/notes 09/27/2016   ANTERIOR CERVICAL DECOMP/DISCECTOMY FUSION N/A 09/27/2016   Procedure: C5-6 Anterior Cervical Discectomy and Fusion, Allograft and Plate;  Surgeon: [name], MD;  Location: [place];  Service: Orthopedics;  Laterality: N/A;   Back Fusion  2002   BIOPSY  07/19/2018   Procedure: BIOPSY;  Surgeon: [name], MD;  Location: [facility] ENDOSCOPY;  Service: Endoscopy;;   BIOPSY   02/13/2019   Procedure: BIOPSY;  Surgeon: [name], MD;  [facility]: [facility] ENDOSCOPY;  Service: Endoscopy;;   CARPAL TUNNEL RELEASE  Tobe Kervin   left   COLONOSCOPY N/A 01/05/2014   Procedure: COLONOSCOPY;  Surgeon: [name], MD;  [facility] ENDOSCOPY;  Service: Endoscopy;  Laterality: N/A;   ELBOW SURGERY     age 7   ENTEROSCOPY N/A 02/13/2019   Procedure: ENTEROSCOPY;  Surgeon: [name], MD;  [facility]OSCOPY;  Service: Endoscopy;  Laterality: N/A;   ESOPHAGOGASTRODUODENOSCOPY (EGD) WITH PROPOFOL N/A 07/19/2018   Procedure: ESOPHAGOGASTRODUODENOSCOPY (EGD) WITH PROPOFOL;  Surgeon: [name], MD;  Location: [place];  Service: Endoscopy;  Laterality: N/A;   EXTERNAL EAR SURGERY Left    EYE SURGERY     "removed white dots under eyelid"   FINGER SURGERY Left    INJECTION KNEE Right 07/21/2022   Procedure: INTRA-ARTICULAR INJECTION UNDER ANESTHESIA;  Surgeon: [name], MD;  Location: [place];  Service: Orthopedics;  Laterality: Right;   Juvara osteomy     KNEE SURGERY     NOSE SURGERY     Rt. toe bunion     skin, shave biopsy  05/03/2016   Left occipital scalp, top of scalp   TONSILLECTOMY     TOTAL HIP ARTHROPLASTY Right 12/17/2020   TOTAL HIP ARTHROPLASTY Right 12/17/2020   Procedure: TOTAL HIP ARTHROPLASTY-DIRECT ANTERIOR;  Surgeon: [name], MD;  Location: [place];  Service: Orthopedics;  Laterality: Right;  needs RNFA   TOTAL KNEE ARTHROPLASTY Left 07/21/2022   Procedure: LEFT TOTAL KNEE ARTHROPLASTY;  Surgeon: [name], MD;  Location: [place];  Service: Orthopedics;  Laterality: Left;   UPPER EXTREMITY ANGIOGRAPHY Bilateral 10/11/2018   Procedure: UPPER EXTREMITY ANGIOGRAPHY;  Surgeon: [name], MD;  Location: [place] CV LAB;  Service: Cardiovascular;  Laterality: Bilateral;   Social History   Occupational History   Not on file  Tobacco Use  Smoking status: Former    Packs/day: 2.00    Years: 30.00    Total pack years: 60.00    Types:  Cigarettes    Quit date: 10/23/2001    Years since quitting: 21.1    Passive exposure: Past   Smokeless tobacco: Never  Vaping Use   Vaping Use: Never used  Substance and Sexual Activity   Alcohol use: No   Drug use: No   Sexual activity: Not on file

## 2022-12-18 ENCOUNTER — Telehealth: Payer: Self-pay | Admitting: Pulmonary Disease

## 2022-12-18 NOTE — Telephone Encounter (Signed)
PT calling w/ chest and back pain due to congestion and she has some phlegm. Pls call to advise. Dcln appt.  Pharm is: [facility] on [address]  call her @ [phone]

## 2022-12-19 NOTE — Telephone Encounter (Signed)
Patient is returning phone call. Patient phone number is [phone].

## 2022-12-19 NOTE — Telephone Encounter (Signed)
Called and spoke with patient. Patient stated that she went and got her some zyrtec and it's helping her get some of that phlegm and stuff out of her and that she is feeling slightly better today. Patient also stated that she will give [place] a call back if she gets any worse.   Nothing further needed at this time.

## 2022-12-19 NOTE — Telephone Encounter (Signed)
ATC patient. LVMTCB. 

## 2023-01-09 ENCOUNTER — Ambulatory Visit: Payer: Medicare HMO | Admitting: Internal Medicine

## 2023-01-23 ENCOUNTER — Ambulatory Visit: Payer: Medicare HMO | Admitting: Podiatry

## 2023-02-07 ENCOUNTER — Other Ambulatory Visit: Payer: Self-pay | Admitting: Physician Assistant

## 2023-02-07 ENCOUNTER — Other Ambulatory Visit: Payer: Self-pay | Admitting: Internal Medicine

## 2023-02-09 ENCOUNTER — Ambulatory Visit: Payer: Medicare HMO | Admitting: Internal Medicine

## 2023-02-14 ENCOUNTER — Other Ambulatory Visit: Payer: Self-pay | Admitting: Physician Assistant

## 2023-02-23 ENCOUNTER — Ambulatory Visit: Payer: Medicare HMO | Admitting: Internal Medicine

## 2023-02-26 ENCOUNTER — Telehealth: Payer: Self-pay | Admitting: Internal Medicine

## 2023-02-26 NOTE — Telephone Encounter (Signed)
Contacted Desiree [name] to schedule their annual wellness visit. Appointment made for 03/06/23.  Desiree [name] AWV direct phone # [phone]   Due to schedule 03/06/23 awv change moved to [employer] schedule pt aware of appt time change
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
## 2023-02-28 ENCOUNTER — Other Ambulatory Visit (HOSPITAL_BASED_OUTPATIENT_CLINIC_OR_DEPARTMENT_OTHER): Payer: Self-pay

## 2023-03-06 ENCOUNTER — Ambulatory Visit (INDEPENDENT_AMBULATORY_CARE_PROVIDER_SITE_OTHER): Payer: Medicare HMO | Admitting: Family Medicine

## 2023-03-06 ENCOUNTER — Encounter: Payer: Self-pay | Admitting: Family Medicine

## 2023-03-06 VITALS — Ht 63.0 in | Wt 152.0 lb

## 2023-03-06 DIAGNOSIS — Z0000 Encounter for general adult medical examination without abnormal findings: Secondary | ICD-10-CM | POA: Diagnosis not present

## 2023-03-06 NOTE — Patient Instructions (Addendum)
I really enjoyed getting to talk with you today! I am available on Tuesdays and Thursdays for virtual visits if you have any questions or concerns, or if I can be of any further assistance.   CHECKLIST FROM ANNUAL WELLNESS VISIT:  -Follow up (please call to schedule if not scheduled after visit):   -schedule lab visit   -yearly for annual wellness visit with primary care office  Here is a list of your preventive care/health maintenance measures and the plan for each if any are due:  PLAN For any measures below that may be due:  -schedule mammogram and dexa with your gynecologist -please schedule your lab visit for labs -you can get covid boosters at the pharmacy   Health Maintenance  Topic Date Due   DEXA SCAN  Never done   COVID-19 Vaccine (7 - 2023-24 season) 10/14/2022   HEMOGLOBIN A1C  11/30/2022   MAMMOGRAM  03/22/2024 (Originally 02/21/2022)   INFLUENZA VACCINE  05/24/2023   Diabetic kidney evaluation - Urine ACR  05/31/2023   OPHTHALMOLOGY EXAM  08/11/2023   Diabetic kidney evaluation - eGFR measurement  08/17/2023   FOOT EXAM  09/21/2023   COLONOSCOPY (Pts 45-76yrs Insurance coverage will need to be confirmed)  01/06/2024   Medicare Annual Wellness (AWV)  03/05/2024   DTaP/Tdap/Td (3 - Td or Tdap) 04/25/2026   Pneumonia Vaccine 28+ Years old  Completed   Hepatitis C Screening  Completed   Zoster Vaccines- Shingrix  Completed   HPV VACCINES  Aged Out    -See a dentist at least yearly  -Get your eyes checked and then per your eye specialist's recommendations  -Other issues addressed today:   -I have included below further information regarding a healthy whole foods based diet, physical activity guidelines for adults, stress management and opportunities for social connections. I hope you find this information useful.    -----------------------------------------------------------------------------------------------------------------------------------------------------------------------------------------------------------------------------------------------------------  NUTRITION: -eat real food: lots of colorful vegetables (half the plate) and fruits -5-7 servings of vegetables and fruits per day (fresh or steamed is best), exp. 2 servings of vegetables with lunch and dinner and 2 servings of fruit per day. Berries and greens such as kale and collards are great choices.  -consume on a regular basis: whole grains (make sure first ingredient on label contains the word "whole"), fresh fruits, fish, nuts, seeds, healthy oils (such as olive oil, avocado oil, grape seed oil) -may eat small amounts of dairy and lean meat on occasion, but avoid processed meats such as ham, bacon, lunch meat, etc. -drink water -try to avoid fast food and pre-packaged foods, processed meat -most experts advise limiting sodium to < 2300mg  per day, should limit further is any chronic conditions such as high blood pressure, heart disease, diabetes, etc. The American Heart Association advised that < 1500mg  is is ideal -try to avoid foods that contain any ingredients with names you do not recognize  -try to avoid sugar/sweets (except for the natural sugar that occurs in fresh fruit) -try to avoid sweet drinks -try to avoid white rice, white bread, pasta (unless whole grain), white or yellow potatoes  EXERCISE GUIDELINES FOR ADULTS: -if you wish to increase your physical activity, do so gradually and with the approval of your doctor -STOP and seek medical care immediately if you have any chest pain, chest discomfort or trouble breathing when starting or increasing exercise  -move and stretch your body, legs, feet and arms when sitting for long periods -Physical activity guidelines for optimal health in adults: -least  150 minutes per week of  aerobic exercise (can talk, but not sing) once approved by your doctor, 20-30 minutes of sustained activity or two 10 minute episodes of sustained activity every day.  -resistance training at least 2 days per week if approved by your doctor -balance exercises 3+ days per week:   Stand somewhere where you have something sturdy to hold onto if you lose balance.    1) lift up on toes, start with 5x per day and work up to 20x   2) stand and lift on leg straight out to the side so that foot is a few inches of the floor, start with 5x each side and work up to 20x each side   3) stand on one foot, start with 5 seconds each side and work up to 20 seconds on each side  If you need ideas or help with getting more active:  -Silver sneakers https://tools.silversneakers.com  -Walk with a Doc: http://stephens-thompson.biz/  -try to include resistance (weight lifting/strength building) and balance exercises twice per week: or the following link for ideas: ChessContest.fr  UpdateClothing.com.cy  STRESS MANAGEMENT: -can try meditating, or just sitting quietly with deep breathing while intentionally relaxing all parts of your body for 5 minutes daily -if you need further help with stress, anxiety or depression please follow up with your primary doctor or contact the wonderful folks at [place]: [place]: -options in [place] if you wish to engage in more social and exercise related activities:  -Silver sneakers https://tools.silversneakers.com  -Walk with a Doc: http://stephens-thompson.biz/  -Check out the [place] 50+ section on the [place] [employer] (hiking clubs, book clubs, cards and games, chess, exercise classes, aquatic classes and much more) - see the website for  details: https://www.[place].gov/departments/parks-recreation/active-adults50  -YouTube has lots of exercise videos for different ages and abilities as well  -[place] (a variety of indoor and outdoor inperson activities for adults). [phone]. [address]  -Virtual Online Classes (a variety of topics): see seniorplanet.org or call [phone]  -consider volunteering at a school, hospice center, church, senior center or elsewhere

## 2023-03-06 NOTE — Progress Notes (Signed)
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 recognize  -try to avoid sugar/sweets (except for the natural sugar that occurs in fresh fruit) -try to avoid sweet drinks -try to avoid white rice, white bread, pasta (unless whole grain), white or yellow potatoes  EXERCISE GUIDELINES FOR ADULTS: -if you wish to increase your physical activity, do so gradually and with the approval of your doctor -STOP and seek medical care immediately if you have any chest pain, chest discomfort or trouble breathing when starting or increasing exercise  -move and stretch your body, legs, feet and arms when sitting for long periods -Physical activity guidelines for optimal health in adults: -least 150 minutes per week of  aerobic exercise (can talk, but not sing) once approved by your doctor, 20-30 minutes of sustained activity or two 10 minute episodes of sustained activity every day.  -resistance training at least 2 days per week if approved by your doctor -balance exercises 3+ days per week:   Stand somewhere where you have something sturdy to hold onto if you lose balance.    1) lift up on toes, start with 5x per day and work up to 20x   2) stand and lift on leg straight out to the side so that foot is a few inches of the floor, start with 5x each side and work up to 20x each side   3) stand on one foot, start with 5 seconds each side and work up to 20 seconds on each side  If you need ideas or help with getting more active:  -Silver sneakers https://tools.silversneakers.com  -Walk with a Doc: http://www.duncan-williams.com/  -try to include resistance (weight lifting/strength building) and balance exercises twice per week: or the following link for ideas: http://castillo-powell.com/  BuyDucts.dk  STRESS MANAGEMENT: -can try meditating, or just sitting quietly with deep breathing while intentionally relaxing all parts of your body for 5 minutes daily -if you need further help with stress, anxiety or depression please follow up with your primary doctor or contact the wonderful folks at [facility]: [phone]  SOCIAL CONNECTIONS: -options in [place] if you wish to engage in more social and exercise related activities:  -Silver sneakers https://tools.silversneakers.com  -Walk with a Doc: http://www.duncan-williams.com/  -Check out the [facility] Active Adults 50+ section on the [place] [employer] (hiking clubs, book clubs, cards and games, chess, exercise classes, aquatic classes and much more) - see the website for  details: https://www.[place].gov/departments/parks-recreation/active-adults50  -YouTube has lots of exercise videos for different ages and abilities as well  -[facility] (a variety of indoor and outdoor inperson activities for adults). [phone]. [address].  -Virtual Online Classes (a variety of  topics): see seniorplanet.org or call [phone]  -consider volunteering at a school, hospice center, church, senior center or elsewhere           Terressa Koyanagi, DO

## 2023-03-09 ENCOUNTER — Other Ambulatory Visit (INDEPENDENT_AMBULATORY_CARE_PROVIDER_SITE_OTHER): Payer: Medicare HMO

## 2023-03-09 DIAGNOSIS — E785 Hyperlipidemia, unspecified: Secondary | ICD-10-CM | POA: Diagnosis not present

## 2023-03-09 DIAGNOSIS — I10 Essential (primary) hypertension: Secondary | ICD-10-CM

## 2023-03-09 DIAGNOSIS — N183 Chronic kidney disease, stage 3 unspecified: Secondary | ICD-10-CM | POA: Diagnosis not present

## 2023-03-09 DIAGNOSIS — E1169 Type 2 diabetes mellitus with other specified complication: Secondary | ICD-10-CM | POA: Diagnosis not present

## 2023-03-09 DIAGNOSIS — Z0000 Encounter for general adult medical examination without abnormal findings: Secondary | ICD-10-CM

## 2023-03-09 LAB — CBC WITH DIFFERENTIAL/PLATELET
Basophils Absolute: 0.1 10*3/uL (ref 0.0–0.1)
Basophils Relative: 1.5 % (ref 0.0–3.0)
Eosinophils Absolute: 0.7 10*3/uL (ref 0.0–0.7)
Eosinophils Relative: 9.4 % — ABNORMAL HIGH (ref 0.0–5.0)
HCT: 39.7 % (ref 36.0–46.0)
Hemoglobin: 13.3 g/dL (ref 12.0–15.0)
Lymphocytes Relative: 40.7 % (ref 12.0–46.0)
Lymphs Abs: 2.9 10*3/uL (ref 0.7–4.0)
MCHC: 33.5 g/dL (ref 30.0–36.0)
MCV: 91.9 fl (ref 78.0–100.0)
Monocytes Absolute: 0.8 10*3/uL (ref 0.1–1.0)
Monocytes Relative: 10.4 % (ref 3.0–12.0)
Neutro Abs: 2.7 10*3/uL (ref 1.4–7.7)
Neutrophils Relative %: 38 % — ABNORMAL LOW (ref 43.0–77.0)
Platelets: 242 10*3/uL (ref 150.0–400.0)
RBC: 4.33 Mil/uL (ref 3.87–5.11)
RDW: 14 % (ref 11.5–15.5)
WBC: 7.2 10*3/uL (ref 4.0–10.5)

## 2023-03-09 LAB — COMPREHENSIVE METABOLIC PANEL
ALT: 13 U/L (ref 0–35)
AST: 22 U/L (ref 0–37)
Albumin: 4.3 g/dL (ref 3.5–5.2)
Alkaline Phosphatase: 59 U/L (ref 39–117)
BUN: 21 mg/dL (ref 6–23)
CO2: 29 mEq/L (ref 19–32)
Calcium: 9.6 mg/dL (ref 8.4–10.5)
Chloride: 101 mEq/L (ref 96–112)
Creatinine, Ser: 1.43 mg/dL — ABNORMAL HIGH (ref 0.40–1.20)
GFR: 37.06 mL/min — ABNORMAL LOW (ref >60.00)
Glucose, Bld: 84 mg/dL (ref 70–99)
Potassium: 4.3 mEq/L (ref 3.5–5.1)
Sodium: 136 mEq/L (ref 135–145)
Total Bilirubin: 0.4 mg/dL (ref 0.2–1.2)
Total Protein: 7.5 g/dL (ref 6.0–8.3)

## 2023-03-09 LAB — LIPID PANEL
Cholesterol: 149 mg/dL (ref 0–200)
HDL: 57.9 mg/dL (ref >39.00)
LDL Cholesterol: 72 mg/dL (ref 0–99)
NonHDL: 91.24
Total CHOL/HDL Ratio: 3
Triglycerides: 95 mg/dL (ref 0.0–149.0)
VLDL: 19 mg/dL (ref 0.0–40.0)

## 2023-03-09 LAB — HEMOGLOBIN A1C: Hgb A1c MFr Bld: 6 % (ref 4.6–6.5)

## 2023-03-09 LAB — TSH: TSH: 2.61 u[IU]/mL (ref 0.35–5.50)

## 2023-03-12 ENCOUNTER — Ambulatory Visit: Payer: Medicare HMO | Admitting: Physician Assistant

## 2023-03-12 DIAGNOSIS — M0579 Rheumatoid arthritis with rheumatoid factor of multiple sites without organ or systems involvement: Secondary | ICD-10-CM

## 2023-03-12 DIAGNOSIS — M19041 Primary osteoarthritis, right hand: Secondary | ICD-10-CM

## 2023-03-12 DIAGNOSIS — I10 Essential (primary) hypertension: Secondary | ICD-10-CM

## 2023-03-12 DIAGNOSIS — M19071 Primary osteoarthritis, right ankle and foot: Secondary | ICD-10-CM

## 2023-03-12 DIAGNOSIS — Z8659 Personal history of other mental and behavioral disorders: Secondary | ICD-10-CM

## 2023-03-12 DIAGNOSIS — N1832 Chronic kidney disease, stage 3b: Secondary | ICD-10-CM

## 2023-03-12 DIAGNOSIS — Z8639 Personal history of other endocrine, nutritional and metabolic disease: Secondary | ICD-10-CM

## 2023-03-12 DIAGNOSIS — M5136 Other intervertebral disc degeneration, lumbar region: Secondary | ICD-10-CM

## 2023-03-12 DIAGNOSIS — Z96641 Presence of right artificial hip joint: Secondary | ICD-10-CM

## 2023-03-12 DIAGNOSIS — Z87891 Personal history of nicotine dependence: Secondary | ICD-10-CM

## 2023-03-12 DIAGNOSIS — M1711 Unilateral primary osteoarthritis, right knee: Secondary | ICD-10-CM

## 2023-03-12 DIAGNOSIS — M5030 Other cervical disc degeneration, unspecified cervical region: Secondary | ICD-10-CM

## 2023-03-12 DIAGNOSIS — Z79899 Other long term (current) drug therapy: Secondary | ICD-10-CM

## 2023-03-12 DIAGNOSIS — E782 Mixed hyperlipidemia: Secondary | ICD-10-CM

## 2023-03-12 DIAGNOSIS — Z96652 Presence of left artificial knee joint: Secondary | ICD-10-CM

## 2023-03-12 DIAGNOSIS — I7301 Raynaud's syndrome with gangrene: Secondary | ICD-10-CM

## 2023-03-12 DIAGNOSIS — J479 Bronchiectasis, uncomplicated: Secondary | ICD-10-CM

## 2023-03-14 ENCOUNTER — Telehealth: Payer: Self-pay

## 2023-03-14 NOTE — Telephone Encounter (Signed)
Received Patient Request for Access form to fax lab reports to [facility].   Lab was routed to [facility]: Fax: [phone]  Phone: [phone]

## 2023-03-20 ENCOUNTER — Other Ambulatory Visit: Payer: Self-pay | Admitting: Internal Medicine

## 2023-03-23 ENCOUNTER — Encounter: Payer: Self-pay | Admitting: Pulmonary Disease

## 2023-03-23 ENCOUNTER — Ambulatory Visit: Payer: Medicare HMO | Admitting: Pulmonary Disease

## 2023-03-23 VITALS — BP 108/64 | HR 94 | Ht 63.0 in | Wt 157.0 lb

## 2023-03-23 DIAGNOSIS — J449 Chronic obstructive pulmonary disease, unspecified: Secondary | ICD-10-CM | POA: Diagnosis not present

## 2023-03-23 DIAGNOSIS — J479 Bronchiectasis, uncomplicated: Secondary | ICD-10-CM | POA: Diagnosis not present

## 2023-03-23 MED ORDER — BREZTRI AEROSPHERE 160-9-4.8 MCG/ACT IN AERO: 2.0000 | INHALATION_SPRAY | Freq: Two times a day (BID) | RESPIRATORY_TRACT | 11 refills | Status: DC

## 2023-03-23 MED ORDER — BREZTRI AEROSPHERE 160-9-4.8 MCG/ACT IN AERO: 2.0000 | INHALATION_SPRAY | Freq: Two times a day (BID) | RESPIRATORY_TRACT | 0 refills | Status: DC

## 2023-03-23 NOTE — Patient Instructions (Signed)
Nice to see you again  Continue Breztri 2 puffs twice a day, samples today, new prescription  Will submit manufacturing assistance to see if the will help pay for the drug  Continue albuterol as needed  Return to clinic in 6 months or sooner as needed with Dr. Judeth Horn
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
## 2023-03-23 NOTE — Addendum Note (Signed)
Addended by: Wyvonne Lenz on: 03/23/2023 03:10 PM   Modules accepted: Orders
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
## 2023-03-26 ENCOUNTER — Telehealth: Payer: Self-pay | Admitting: Pulmonary Disease

## 2023-03-26 NOTE — Telephone Encounter (Signed)
PT presented to the front desk with 2 pages with the headline [employer]&ME Application for Free [employer] Medicines. I gave her a copy and put it in the RX box in the phone room. NFN.

## 2023-03-29 ENCOUNTER — Telehealth: Payer: Self-pay

## 2023-03-29 NOTE — Progress Notes (Signed)
[name], female   DOB: December 15, 1950, 72 y.o.   MRN: [id number]  Care Management & Coordination Services Pharmacy Team  Reason for Encounter: General adherence update   Contacted patient for general health update and medication adherence call.  Spoke with patient on 03/29/2023    What concerns do you have about your medications? Patient reports none  The patient denies side effects with their medications.   How often do you forget or accidentally miss a dose? Rarely  Do you use a pillbox? Yes  Are you having any problems getting your medications from your pharmacy? No  Has the cost of your medications been a concern? No Patient reports she has still been working on her patient assistance that was sent to her for her Markus Daft and given some samples at her past pulmonology visit, she states when she is done will all of them she is to alert pulmonology so they can restart pap for her.  The patient has not had an ED visit since last contact.   The patient reports the following problems with their health.  Patient reports her neighbor uses some strong cleaning supplies and it has been bothering her respiratory wise, she states she has been compliant in use of her maintenance inhaler and is also using her flutter valve 2x a day once in am and again before bed seems to help calm it down, she states she has made pulmon aware and will report back to them if things are worsening. Patient reports she has been wearing her diabetic shoes and inserts and her feet have been feeling good. Patient follow up with Clinical Pharm scheduled for 05/2023  Patient denies concerns or questions for Milas Kocher, PharmD at this time.   Counseled patient on: Great job taking medications and Access to carecoordination team for any cost, medication or pharmacy concerns.  Chart Updates:  Recent office visits:  03/06/23 Terressa Koyanagi, DO - Patient presented via phone for Medicare Annual Wellness exam. No  medication changes.  Recent consult visits:  03/23/23 Hunsucker, Lesia Sago, MD (Pulmonol) - Patient presented for Bronchiectasis without complication and other concerns. Increased Breztri. Stopped Theratears. Stopped Cyclobenzaprine. Stopped Melaxone. Stopped Methocarbamol. Stopped Tramadol.  12/15/22 Eldred Manges, MD - Patient presented for History of total knee arthoplasty left and other concerns. No medication changes.  12/13/22 Adela Ports - Clinical Support encounter for diabetic inserts. No medication changes.  11/13/22 April Manson, PT - Patient presented for Acute pain of left knee and other concerns. No medication changes.  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 No other visit details available.   11/07/22 Solon Palm, CMA - Clinical Support encounter for diabetic shoes. No medication changes.  10/13/22 Eldred Manges, MD (Orthopedic Surg) - Patient presented for History of total knee arthroplasty left. Prescribed Metaxalone.   10/12/22 Eulis Foster, FNP - Patient presented for routine general medical examination at a health care facility and other concerns. No medication changes.  10/11/22 [name], MD (Rheumatology) - Patient presented for Rheumatoid arthritis with rheumatoid factor of multiple sites without organ or systems involvement and other concerns. No medication changes.   Hospital visits:  None in previous 6 months  Medications: Outpatient Encounter Medications as of 03/29/2023  Medication Sig   Accu-Chek Softclix Lancets lancets Use to test blood sugar daily. Dx:e11.9   acetaminophen (TYLENOL) 500 MG tablet Take 500 mg by mouth  every 6 (six) hours as needed for mild pain or moderate pain.   albuterol (VENTOLIN HFA) 108 (90 Base) MCG/ACT inhaler INHALE 2 PUFFS INTO THE LUNGS EVERY 6 (SIX) HOURS AS NEEDED FOR WHEEZING OR SHORTNESS OF BREATH.   Alcohol Swabs  (DROPSAFE ALCOHOL PREP) 70 % PADS USE AS DIRECTED   amLODipine (NORVASC) 5 MG tablet TAKE 1 TABLET EVERY MORNING   aspirin EC 325 MG tablet Take 1 tablet (325 mg total) by mouth daily. MUST TAKE AT LEAST 4 WEEKS POSTOP FOR DVT PROPHYLAXIS   azelastine (ASTELIN) 0.1 % nasal spray USE 2 SPRAYS IN EACH NOSTRIL TWICE DAILY AS DIRECTED   Blood Glucose Monitoring Suppl (TRUE METRIX AIR GLUCOSE METER) w/Device KIT Use as directed DX:11.9   Budeson-Glycopyrrol-Formoterol (BREZTRI AEROSPHERE) 160-9-4.8 MCG/ACT AERO Inhale 2 puffs into the lungs in the morning and at bedtime.   Budeson-Glycopyrrol-Formoterol (BREZTRI AEROSPHERE) 160-9-4.8 MCG/ACT AERO Inhale 2 puffs into the lungs in the morning and at bedtime.   cetirizine (ZYRTEC) 10 MG tablet Take 10 mg by mouth at bedtime.    diclofenac sodium (VOLTAREN) 1 % GEL APPLY 2-4 GRAMS TO AFFECTED JOINTS UP TO FOUR TIMES DAILY   docusate sodium (DULCOLAX) 100 MG capsule Take 100 mg by mouth as needed for mild constipation or moderate constipation.   famotidine (PEPCID) 40 MG tablet Take 1 tablet (40 mg total) by mouth daily.   feeding supplement, ENSURE ENLIVE, (ENSURE ENLIVE) LIQD Take 237 mLs by mouth 3 (three) times daily between meals. (Patient taking differently: Take 237 mLs by mouth 2 (two) times daily between meals. vanilla)   fluticasone (FLONASE) 50 MCG/ACT nasal spray Place 2 sprays into both nostrils at bedtime.   glucose blood (TRUE METRIX BLOOD GLUCOSE TEST) test strip USE AS INSTRUCTED   hydroxychloroquine (PLAQUENIL) 200 MG tablet TAKE 1 TABLET EVERY DAY   montelukast (SINGULAIR) 10 MG tablet TAKE 1 TABLET AT BEDTIME   Multiple Vitamins-Minerals (CENTRUM SILVER 50+WOMEN) TABS Take 1 tablet by mouth daily.   neomycin-bacitracin-polymyxin (NEOSPORIN) [id number] ointment Apply 1 Application topically daily as needed (on fingers).   ondansetron (ZOFRAN-ODT) 4 MG disintegrating tablet DISSOLVE 1 TABLET ON THE TONGUE EVERY 8 HOURS AS NEEDED FOR NAUSEA  OR VOMITING   Povidone, PF, 0.5 % SOLN Apply to eye as needed for dry eyes.   pravastatin (PRAVACHOL) 40 MG tablet TAKE 1 TABLET EVERY DAY   Respiratory Therapy Supplies (FLUTTER) DEVI 1 Device by Does not apply route as directed.   TRUEplus Lancets 33G MISC USE AS DIRECTED   Facility-Administered Encounter Medications as of 03/29/2023  Medication   diclofenac Sodium (VOLTAREN) 1 % topical gel 4 g    Recent vitals BP Readings from Last 3 Encounters:  03/23/23 108/64  12/15/22 (!) 93/59  10/13/22 122/71   Pulse Readings from Last 3 Encounters:  03/23/23 94  12/15/22 89  10/13/22 92   Wt Readings from Last 3 Encounters:  03/23/23 157 lb (71.2 kg)  03/06/23 152 lb (68.9 kg)  12/15/22 155 lb (70.3 kg)   BMI Readings from Last 3 Encounters:  03/23/23 27.81 kg/m  03/06/23 26.93 kg/m  12/15/22 28.35 kg/m    Recent lab results    Component Value Date/Time   NA 136 03/09/2023 0840   NA 142 08/16/2022 1007   K 4.3 03/09/2023 0840   CL 101 03/09/2023 0840   CO2 29 03/09/2023 0840   GLUCOSE 84 03/09/2023 0840   BUN 21 03/09/2023 0840   BUN 16 08/16/2022 1007   CREATININE  1.43 (H) 03/09/2023 0840   CREATININE 1.87 (H) 07/28/2022 1536   CALCIUM 9.6 03/09/2023 0840   CALCIUM 10.1 06/25/2008 0000    Lab Results  Component Value Date   CREATININE 1.43 (H) 03/09/2023   GFR 37.06 (L) 03/09/2023   EGFR 38 (L) 08/16/2022   GFRNONAA 34 (L) 07/24/2022   GFRAA 43 02/09/2021   Lab Results  Component Value Date/Time   HGBA1C 6.0 03/09/2023 08:40 AM   HGBA1C 6.3 05/30/2022 02:39 PM   MICROALBUR 1.5 05/30/2022 02:39 PM   MICROALBUR 22.0 (H) 06/22/2021 01:52 PM    Lab Results  Component Value Date   CHOL 149 03/09/2023   HDL 57.90 03/09/2023   LDLCALC 72 03/09/2023   LDLDIRECT 146.3 01/26/2010   TRIG 95.0 03/09/2023   CHOLHDL 3 03/09/2023    Care Gaps: DEXA Scan - Overdue COVID Booster - Overdue AWV - 03/06/23  Mammogram - Postponed   Star Rating Drugs:    Pravastatin 40 mg - Last filled 03/21/23 90 DS at [facility]   Pamala Duffel CMA Clinical Pharmacist Assistant [phone]

## 2023-04-02 NOTE — Telephone Encounter (Signed)
Received from from pharmacy team. I have faxed the forms to [employer] for patient assistance for [facility].

## 2023-04-03 ENCOUNTER — Ambulatory Visit: Payer: Medicare HMO | Admitting: Podiatry

## 2023-04-03 DIAGNOSIS — M2041 Other hammer toe(s) (acquired), right foot: Secondary | ICD-10-CM | POA: Diagnosis not present

## 2023-04-03 DIAGNOSIS — L84 Corns and callosities: Secondary | ICD-10-CM

## 2023-04-03 DIAGNOSIS — E1151 Type 2 diabetes mellitus with diabetic peripheral angiopathy without gangrene: Secondary | ICD-10-CM

## 2023-04-03 DIAGNOSIS — E119 Type 2 diabetes mellitus without complications: Secondary | ICD-10-CM

## 2023-04-04 ENCOUNTER — Other Ambulatory Visit: Payer: Self-pay | Admitting: Pulmonary Disease

## 2023-04-04 ENCOUNTER — Other Ambulatory Visit: Payer: Self-pay | Admitting: Internal Medicine

## 2023-04-04 NOTE — Progress Notes (Signed)
  Subjective:  [name], female    DOB: [birth year],  MRN: [id number]  Chief Complaint  Patient presents with   Hammer Toe    Pt states she wants her hammer toe checked   Nail Problem    DFC    72 y.o. female presents with the above complaint. History confirmed with patient.  She presents with painful calluses, her diabetes is still well-controlled she is still using the urea cream.  Objective:  Physical Exam: warm, good capillary refill, no trophic changes or ulcerative lesions, weakly palpable DP and PT pulses, will hammertoe right second toe there are multiple hyperkeratotic painful lesions as follows: Right fifth toe, second medial toe, submetatarsal 5 and submetatarsal 2: Left submetatarsal 1 and submetatarsal 5.  Assessment:   1. Callus   2. Hammer toe of right foot   3. Diabetes mellitus type 2 with peripheral artery disease (HCC)      Plan:  Patient was evaluated and treated and all questions answered.  Patient educated on diabetes. Discussed proper diabetic foot care and discussed risks and complications of disease. Educated patient in depth on reasons to return to the office immediately should he/she discover anything concerning or new on the feet. All questions answered. Discussed proper shoes as well.  Annual diabetic foot risk examination performed today.  We also discussed her hammertoe deformities.  We also discussed surgical and nonsurgical treatment.  Continue to utilize offloading pads and supportive accommodative shoes, if worsening could consider surgical intervention she will let me know if she would like to proceed with any of this.    All symptomatic hyperkeratoses were safely debrided with a sterile #15 blade to patient's level of comfort without incident. We discussed preventative and palliative care of these lesions including supportive and accommodative shoegear, padding, prefabricated and custom molded accommodative orthoses, use of a pumice  stone and lotions/creams daily.    Return in about 3 months (around 07/04/2023) for calluses.

## 2023-04-05 ENCOUNTER — Ambulatory Visit (INDEPENDENT_AMBULATORY_CARE_PROVIDER_SITE_OTHER): Payer: Medicare HMO

## 2023-04-05 ENCOUNTER — Ambulatory Visit: Payer: Medicare HMO | Attending: Internal Medicine | Admitting: Internal Medicine

## 2023-04-05 ENCOUNTER — Encounter: Payer: Self-pay | Admitting: Internal Medicine

## 2023-04-05 VITALS — BP 104/60 | HR 65 | Ht 62.5 in | Wt 159.0 lb

## 2023-04-05 DIAGNOSIS — I471 Supraventricular tachycardia, unspecified: Secondary | ICD-10-CM | POA: Diagnosis not present

## 2023-04-05 DIAGNOSIS — I422 Other hypertrophic cardiomyopathy: Secondary | ICD-10-CM | POA: Diagnosis not present

## 2023-04-05 DIAGNOSIS — J449 Chronic obstructive pulmonary disease, unspecified: Secondary | ICD-10-CM | POA: Diagnosis not present

## 2023-04-05 DIAGNOSIS — I4729 Other ventricular tachycardia: Secondary | ICD-10-CM | POA: Diagnosis not present
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
## 2023-04-05 NOTE — Patient Instructions (Signed)
Medication Instructions:  Your physician recommends that you continue on your current medications as directed. Please refer to the Current Medication list given to you today.  *If you need a refill on your cardiac medications before your next appointment, please call your pharmacy*   Lab Work: None  If you have labs (blood work) drawn today and your tests are completely normal, you will receive your results only by: MyChart Message (if you have MyChart) OR A paper copy in the mail If you have any lab test that is abnormal or we need to change your treatment, we will call you to review the results.   Testing/Procedures: Your physician has recommended you wear a heart monitor.    Follow-Up: At [facility], you and your health needs are our priority.  As part of our continuing mission to provide you with exceptional heart care, we have created designated Provider Care Teams.  These Care Teams include your primary Cardiologist (physician) and Advanced Practice Providers (APPs -  Physician Assistants and Nurse Practitioners) who all work together to provide you with the care you need, when you need it.    Your next appointment:   1 year(s)  Provider:   Charlton Haws, MD     Other Instructions Christena Deem- Long Term Monitor Instructions  Your physician has requested you wear a ZIO patch monitor for 7 days.  This is a single patch monitor. [employer] supplies one patch monitor per enrollment. Additional stickers are not available. Please do not apply patch if you will be having a Nuclear Stress Test,   Cardiac CT, MRI, or Chest Xray during the period you would be wearing the  monitor. The patch cannot be worn during these tests. You cannot remove and re-apply the  ZIO XT patch monitor.  Your ZIO patch monitor will be mailed 3 day USPS to your address on file. It may take 3-5 days  to receive your monitor after you have been enrolled.  Once you have received your monitor, please  review the enclosed instructions. Your monitor  has already been registered assigning a specific monitor serial # to you.  Billing and Patient Assistance Program Information  We have supplied [employer] with any of your insurance information on file for billing purposes. [employer] offers a sliding scale Patient Assistance Program for patients that do not have  insurance, or whose insurance does not completely cover the cost of the ZIO monitor.  You must apply for the Patient Assistance Program to qualify for this discounted rate.  To apply, please call [employer] at [phone], select option 4, select option 2, ask to apply for  Patient Assistance Program. Meredeth Ide will ask your household income, and how many people  are in your household. They will quote your out-of-pocket cost based on that information.  [employer] will also be able to set up a 65-month, interest-free payment plan if needed.  Applying the monitor   Shave hair from upper left chest.  Hold abrader disc by orange tab. Rub abrader in 40 strokes over the upper left chest as  indicated in your monitor instructions.  Clean area with 4 enclosed alcohol pads. Let dry.  Apply patch as indicated in monitor instructions. Patch will be placed under collarbone on left  side of chest with arrow pointing upward.  Rub patch adhesive wings for 2 minutes. Remove white label marked "1". Remove the white  label marked "2". Rub patch adhesive wings for 2 additional minutes.  While looking in a mirror,  press and release button in center of patch. A small green light will  flash 3-4 times. This will be your only indicator that the monitor has been turned on.  Do not shower for the first 24 hours. You may shower after the first 24 hours.  Press the button if you feel a symptom. You will hear a small click. Record Date, Time and  Symptom in the Patient Logbook.  When you are ready to remove the patch, follow instructions on the last 2 pages of Patient   Logbook. Stick patch monitor onto the last page of Patient Logbook.  Place Patient Logbook in the blue and white box. Use locking tab on box and tape box closed  securely. The blue and white box has prepaid postage on it. Please place it in the mailbox as  soon as possible. Your physician should have your test results approximately 7 days after the  monitor has been mailed back to [facility].  Call [facility] Customer Care at [phone] if you have questions regarding  your ZIO XT patch monitor. Call them immediately if you see an orange light blinking on your  monitor.  If your monitor falls off in less than 4 days, contact our Monitor department at [phone].  If your monitor becomes loose or falls off after 4 days call [employer] at [phone] for  suggestions on securing your monitor

## 2023-04-05 NOTE — Progress Notes (Unsigned)
Enrolled patient for a 7 day Zio XT monitor to be mailed to patients home.  

## 2023-04-09 ENCOUNTER — Telehealth: Payer: Self-pay | Admitting: Internal Medicine

## 2023-04-09 DIAGNOSIS — I471 Supraventricular tachycardia, unspecified: Secondary | ICD-10-CM

## 2023-04-09 DIAGNOSIS — I4729 Other ventricular tachycardia: Secondary | ICD-10-CM | POA: Diagnosis not present

## 2023-04-09 NOTE — Telephone Encounter (Signed)
Patient just received her long term monitor, she would like for someone to help her put it on.

## 2023-04-09 NOTE — Telephone Encounter (Signed)
Returned call to patient. Patient is going to try to apply the monitor herself after realizing it was the same monitor she applied last year. She knows to call back if she decides she would like help

## 2023-04-16 ENCOUNTER — Ambulatory Visit: Payer: Medicare HMO | Admitting: Internal Medicine

## 2023-04-16 DIAGNOSIS — N1832 Chronic kidney disease, stage 3b: Secondary | ICD-10-CM | POA: Diagnosis not present

## 2023-04-16 DIAGNOSIS — I129 Hypertensive chronic kidney disease with stage 1 through stage 4 chronic kidney disease, or unspecified chronic kidney disease: Secondary | ICD-10-CM | POA: Diagnosis not present

## 2023-04-16 DIAGNOSIS — E1122 Type 2 diabetes mellitus with diabetic chronic kidney disease: Secondary | ICD-10-CM | POA: Diagnosis not present

## 2023-04-16 DIAGNOSIS — N2581 Secondary hyperparathyroidism of renal origin: Secondary | ICD-10-CM | POA: Diagnosis not present

## 2023-04-16 DIAGNOSIS — D631 Anemia in chronic kidney disease: Secondary | ICD-10-CM | POA: Diagnosis not present

## 2023-04-17 ENCOUNTER — Encounter: Payer: Self-pay | Admitting: Internal Medicine

## 2023-04-17 ENCOUNTER — Ambulatory Visit (INDEPENDENT_AMBULATORY_CARE_PROVIDER_SITE_OTHER): Payer: Medicare HMO | Admitting: Internal Medicine

## 2023-04-17 VITALS — BP 110/58 | HR 88 | Temp 98.7°F | Ht 62.5 in | Wt 159.0 lb

## 2023-04-17 DIAGNOSIS — M069 Rheumatoid arthritis, unspecified: Secondary | ICD-10-CM

## 2023-04-17 DIAGNOSIS — Z79899 Other long term (current) drug therapy: Secondary | ICD-10-CM

## 2023-04-17 DIAGNOSIS — I10 Essential (primary) hypertension: Secondary | ICD-10-CM | POA: Diagnosis not present

## 2023-04-17 DIAGNOSIS — N183 Chronic kidney disease, stage 3 unspecified: Secondary | ICD-10-CM

## 2023-04-17 DIAGNOSIS — E1169 Type 2 diabetes mellitus with other specified complication: Secondary | ICD-10-CM | POA: Diagnosis not present

## 2023-04-17 DIAGNOSIS — I422 Other hypertrophic cardiomyopathy: Secondary | ICD-10-CM

## 2023-04-17 DIAGNOSIS — T148XXA Other injury of unspecified body region, initial encounter: Secondary | ICD-10-CM

## 2023-04-17 DIAGNOSIS — E785 Hyperlipidemia, unspecified: Secondary | ICD-10-CM | POA: Diagnosis not present
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
## 2023-04-17 NOTE — Patient Instructions (Addendum)
Try going down to asa 3 x per week.  Because of the brusing  and I will review record about reasons taking ;usually  having had a heart attack or stroke.  Maybe the "ministroke" hx   Diabetes and bp and cholesterol in excellent control  at this time.  ROV in 6 months and go from there
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
## 2023-04-20 DIAGNOSIS — I471 Supraventricular tachycardia, unspecified: Secondary | ICD-10-CM | POA: Diagnosis not present

## 2023-04-20 DIAGNOSIS — I4729 Other ventricular tachycardia: Secondary | ICD-10-CM | POA: Diagnosis not present

## 2023-05-01 ENCOUNTER — Telehealth: Payer: Self-pay | Admitting: Internal Medicine

## 2023-05-01 NOTE — Telephone Encounter (Signed)
Pt states she forgot to ask during her last visit, why she is sweating so much waking her up at 2 am - 3 am every night?

## 2023-05-01 NOTE — Telephone Encounter (Signed)
Patient states she had spoken to a nurse about recent test results and has a question about it. She states she would like to know if her condition could cause her to sweat a lot. She says at around 1:00 am-3:00 am she wakes up and her gown is wet from sweat.

## 2023-05-02 NOTE — Telephone Encounter (Signed)
Called pt in regards to night sweats.  Reports symptoms started months ago.  Denies CP or feeling like something is sitting on chest.   Reports will discuss night sweats with MD at [facility] at next OV in Aug.  Advised pt if develops CP, nausea, arm pain, extreme fatigue to seek emergent care.  Pt expresses understanding. No further concerns at this time.

## 2023-05-02 NOTE — Telephone Encounter (Signed)
Can't say   .  If she is having to change her clothes and sheets  often with this    despite  having comfortable air conditioning   . She can make another visit to discuss . Also make sure not having fever .

## 2023-05-03 ENCOUNTER — Encounter: Payer: Self-pay | Admitting: Physician Assistant

## 2023-05-03 ENCOUNTER — Ambulatory Visit: Payer: Medicare HMO | Attending: Physician Assistant | Admitting: Physician Assistant

## 2023-05-03 VITALS — BP 126/70 | HR 89 | Resp 17 | Ht 62.5 in | Wt 159.0 lb

## 2023-05-03 DIAGNOSIS — M19041 Primary osteoarthritis, right hand: Secondary | ICD-10-CM | POA: Diagnosis not present

## 2023-05-03 DIAGNOSIS — I10 Essential (primary) hypertension: Secondary | ICD-10-CM

## 2023-05-03 DIAGNOSIS — M19042 Primary osteoarthritis, left hand: Secondary | ICD-10-CM

## 2023-05-03 DIAGNOSIS — M5136 Other intervertebral disc degeneration, lumbar region: Secondary | ICD-10-CM

## 2023-05-03 DIAGNOSIS — M19071 Primary osteoarthritis, right ankle and foot: Secondary | ICD-10-CM

## 2023-05-03 DIAGNOSIS — Z96652 Presence of left artificial knee joint: Secondary | ICD-10-CM

## 2023-05-03 DIAGNOSIS — M0579 Rheumatoid arthritis with rheumatoid factor of multiple sites without organ or systems involvement: Secondary | ICD-10-CM

## 2023-05-03 DIAGNOSIS — I7301 Raynaud's syndrome with gangrene: Secondary | ICD-10-CM | POA: Diagnosis not present

## 2023-05-03 DIAGNOSIS — M5030 Other cervical disc degeneration, unspecified cervical region: Secondary | ICD-10-CM | POA: Diagnosis not present

## 2023-05-03 DIAGNOSIS — Z96641 Presence of right artificial hip joint: Secondary | ICD-10-CM

## 2023-05-03 DIAGNOSIS — I471 Supraventricular tachycardia, unspecified: Secondary | ICD-10-CM

## 2023-05-03 DIAGNOSIS — E782 Mixed hyperlipidemia: Secondary | ICD-10-CM

## 2023-05-03 DIAGNOSIS — M19072 Primary osteoarthritis, left ankle and foot: Secondary | ICD-10-CM

## 2023-05-03 DIAGNOSIS — Z8659 Personal history of other mental and behavioral disorders: Secondary | ICD-10-CM

## 2023-05-03 DIAGNOSIS — M170 Bilateral primary osteoarthritis of knee: Secondary | ICD-10-CM

## 2023-05-03 DIAGNOSIS — Z87891 Personal history of nicotine dependence: Secondary | ICD-10-CM

## 2023-05-03 DIAGNOSIS — M1711 Unilateral primary osteoarthritis, right knee: Secondary | ICD-10-CM

## 2023-05-03 DIAGNOSIS — Z79899 Other long term (current) drug therapy: Secondary | ICD-10-CM

## 2023-05-03 DIAGNOSIS — N1832 Chronic kidney disease, stage 3b: Secondary | ICD-10-CM

## 2023-05-03 DIAGNOSIS — Z8639 Personal history of other endocrine, nutritional and metabolic disease: Secondary | ICD-10-CM

## 2023-05-03 DIAGNOSIS — J479 Bronchiectasis, uncomplicated: Secondary | ICD-10-CM

## 2023-05-03 MED ORDER — HYDROXYCHLOROQUINE SULFATE 200 MG PO TABS: 200.0000 mg | ORAL_TABLET | Freq: Every day | ORAL | 0 refills | Status: DC

## 2023-05-03 NOTE — Patient Instructions (Signed)
Standing Labs We placed an order today for your standing lab work.   Please have your standing labs drawn in October and then every 5 months    Please have your labs drawn 2 weeks prior to your appointment so that the provider can discuss your lab results at your appointment, if possible.  Please note that you may see your imaging and lab results in MyChart before we have reviewed them. We will contact you once all results are reviewed. Please allow our office up to 72 hours to thoroughly review all of the results before contacting the office for clarification of your results.  WALK-IN LAB HOURS  Monday through Thursday from 8:00 am -12:30 pm and 1:00 pm-5:00 pm and Friday from 8:00 am-12:00 pm.  Patients with office visits requiring labs will be seen before walk-in labs.  You may encounter longer than normal wait times. Please allow additional time. Wait times may be shorter on  Monday and Thursday afternoons.  We do not book appointments for walk-in labs. We appreciate your patience and understanding with our staff.   Labs are drawn by [facility]. Please bring your co-pay at the time of your lab draw.  You may receive a bill from [facility] for your lab work.  Please note if you are on Hydroxychloroquine and and an order has been placed for a Hydroxychloroquine level,  you will need to have it drawn 4 hours or more after your last dose.  If you wish to have your labs drawn at another location, please call the office 24 hours in advance so we can fax the orders.  The office is located at [address], [address], [place], [place] [address]   If you have any questions regarding directions or hours of operation,  please call [phone].   As a reminder, please drink plenty of water prior to coming for your lab work. Thanks!

## 2023-05-04 ENCOUNTER — Other Ambulatory Visit: Payer: Self-pay | Admitting: Pulmonary Disease

## 2023-05-04 ENCOUNTER — Telehealth: Payer: Self-pay | Admitting: Pulmonary Disease

## 2023-05-04 MED ORDER — IPRATROPIUM-ALBUTEROL 0.5-2.5 (3) MG/3ML IN SOLN: 3.0000 mL | RESPIRATORY_TRACT | 3 refills | Status: AC | PRN

## 2023-05-04 MED ORDER — IPRATROPIUM-ALBUTEROL 0.5-2.5 (3) MG/3ML IN SOLN: 3.0000 mL | RESPIRATORY_TRACT | 0 refills | Status: AC | PRN

## 2023-05-04 NOTE — Telephone Encounter (Signed)
Her PCP and a nurse told her to see Dr. Rexene Edison. No appts. Avail. Pls call PT at [phone]

## 2023-05-04 NOTE — Telephone Encounter (Signed)
Called and spoke with patient.  She states she feels ok.  She says her breathing is a little off this morning, she was wheezing.  She was instructed to call her lung doctor.  She says someone in the apartment complex uses strong cleaner and that starts it up.  She said she has a nebulizer and rescue inhaler.  She is using her Breztri inhaler as prescribed.  She used her vest earlier and that helped.  She does have her Albuterol inhaler, but does not have any duneb solution.  She usually gets her neb solution from [facility].  I let her know I would send a small supply to [facility] pharmacy to have for the weekend and then her refills to [facility].  I asked her if her breathing was bad enough to need prednisone and she stated no.  I advised her that if her breathing gets worse over the weekend to go to the urgent care or the ER.  I asked her to call back on Monday if her breathing was not better or worse on Monday despite using the duoneb.  She verbalized understanding.  Nothing further needed.

## 2023-05-04 NOTE — Telephone Encounter (Signed)
Called pt to advise of update. Pt has been scheduled. Pt declined sooner ov with another provider. Also, notice pt had a lot of wheezing and I advised that she f/u about the wheezing. Pt stated that she spoke with her Cardiologist and they advised a nurse will call her back. I also advised that she f/u with pulmonologist. Pt stated she will call them today. I articulated to pt that if sx worsens for both conditions over the weekend to seek help at local UC or ED. Pt verbalized understanding.

## 2023-05-07 ENCOUNTER — Telehealth: Payer: Self-pay | Admitting: Pulmonary Disease

## 2023-05-07 ENCOUNTER — Telehealth: Payer: Self-pay

## 2023-05-07 DIAGNOSIS — J479 Bronchiectasis, uncomplicated: Secondary | ICD-10-CM

## 2023-05-07 DIAGNOSIS — J449 Chronic obstructive pulmonary disease, unspecified: Secondary | ICD-10-CM

## 2023-05-07 NOTE — Telephone Encounter (Signed)
Patient states Nebulizer machine broke. Needs new one. Patient uses [facility]. Patient phone number is [phone].

## 2023-05-07 NOTE — Telephone Encounter (Signed)
Patient contacted the office and states [facility] was telling her that they did have her Plaquenil yesterday and now today they are saying they do not have it. Advised the patient we sent Plaquenil into [facility] on 05/03/2023 and that we got a receipt. Patient states she will call the pharmacy back.

## 2023-05-07 NOTE — Telephone Encounter (Signed)
Patient checking on message for Nebulizer machine. Patient phone number is [phone].

## 2023-05-08 ENCOUNTER — Ambulatory Visit: Payer: Medicare HMO | Admitting: Internal Medicine

## 2023-05-08 ENCOUNTER — Encounter: Payer: Self-pay | Admitting: Internal Medicine

## 2023-05-08 ENCOUNTER — Telehealth: Payer: Self-pay

## 2023-05-08 ENCOUNTER — Ambulatory Visit (INDEPENDENT_AMBULATORY_CARE_PROVIDER_SITE_OTHER)
Admission: RE | Admit: 2023-05-08 | Discharge: 2023-05-08 | Disposition: A | Discharge status: Home / Self Care | Payer: Medicare HMO | Source: Ambulatory Visit | Attending: Internal Medicine | Admitting: Internal Medicine

## 2023-05-08 VITALS — BP 120/58 | HR 60 | Temp 98.7°F | Ht 62.5 in | Wt 160.6 lb

## 2023-05-08 DIAGNOSIS — R61 Generalized hyperhidrosis: Secondary | ICD-10-CM

## 2023-05-08 DIAGNOSIS — M069 Rheumatoid arthritis, unspecified: Secondary | ICD-10-CM

## 2023-05-08 DIAGNOSIS — I422 Other hypertrophic cardiomyopathy: Secondary | ICD-10-CM | POA: Diagnosis not present

## 2023-05-08 DIAGNOSIS — J449 Chronic obstructive pulmonary disease, unspecified: Secondary | ICD-10-CM | POA: Diagnosis not present

## 2023-05-08 DIAGNOSIS — R0789 Other chest pain: Secondary | ICD-10-CM | POA: Diagnosis not present

## 2023-05-08 DIAGNOSIS — Z79899 Other long term (current) drug therapy: Secondary | ICD-10-CM

## 2023-05-08 DIAGNOSIS — I771 Stricture of artery: Secondary | ICD-10-CM | POA: Diagnosis not present

## 2023-05-08 DIAGNOSIS — R059 Cough, unspecified: Secondary | ICD-10-CM | POA: Diagnosis not present

## 2023-05-08 NOTE — Telephone Encounter (Signed)
Patient contacted the office to inquire when she needs her labs and Plaquenil Eye Exam done again. After reviewing the chart, patient is on Plaquenil. Advised the patient she needs to get her eye exam done before 08/11/2023 and her labs done before 08/09/2023. Patient verbalized understanding.

## 2023-05-08 NOTE — Telephone Encounter (Signed)
Patient checking on message for Nebulizer machine. States [facility] needs order. Patient phone number is [phone].

## 2023-05-08 NOTE — Progress Notes (Signed)
No new acute findings , no pneumonia

## 2023-05-08 NOTE — Patient Instructions (Addendum)
Gave you duoneb today since  your machine is broken .   I will order a chest x ray  sine you haven't had one in a while.  Uncertain if the sweats off and on are from  your ongoing conditions  or something else .  Will need to get pulmonary  to see you soon , I will send them a message .
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
## 2023-05-09 MED ORDER — AZELASTINE HCL 0.1 % NA SOLN: NASAL | 0 refills | Status: DC

## 2023-05-09 NOTE — Telephone Encounter (Signed)
Spoke with the pt   I have placed order for new neb machine- urgent  Pt aware  Nothing further needed

## 2023-05-11 NOTE — Telephone Encounter (Signed)
Had ov

## 2023-05-30 ENCOUNTER — Telehealth: Payer: Self-pay | Admitting: Internal Medicine

## 2023-05-30 ENCOUNTER — Other Ambulatory Visit: Payer: Self-pay | Admitting: *Deleted

## 2023-05-30 DIAGNOSIS — Z79899 Other long term (current) drug therapy: Secondary | ICD-10-CM

## 2023-05-30 DIAGNOSIS — H40023 Open angle with borderline findings, high risk, bilateral: Secondary | ICD-10-CM | POA: Diagnosis not present

## 2023-05-30 DIAGNOSIS — M0570 Rheumatoid arthritis with rheumatoid factor of unspecified site without organ or systems involvement: Secondary | ICD-10-CM | POA: Diagnosis not present

## 2023-05-30 DIAGNOSIS — H47013 Ischemic optic neuropathy, bilateral: Secondary | ICD-10-CM | POA: Diagnosis not present

## 2023-05-30 LAB — CBC WITH DIFFERENTIAL/PLATELET
Absolute Monocytes: 784 cells/uL (ref 200–950)
Basophils Absolute: 80 cells/uL (ref 0–200)
Basophils Relative: 1 %
Eosinophils Absolute: 568 cells/uL — ABNORMAL HIGH (ref 15–500)
Eosinophils Relative: 7.1 %
HCT: 40 % (ref 35.0–45.0)
Hemoglobin: 13.2 g/dL (ref 11.7–15.5)
Lymphs Abs: 3016 cells/uL (ref 850–3900)
MCH: 29.7 pg (ref 27.0–33.0)
MCHC: 33 g/dL (ref 32.0–36.0)
MCV: 89.9 fL (ref 80.0–100.0)
MPV: 10.4 fL (ref 7.5–12.5)
Monocytes Relative: 9.8 %
Neutro Abs: 3552 cells/uL (ref 1500–7800)
Neutrophils Relative %: 44.4 %
Platelets: 260 10*3/uL (ref 140–400)
RBC: 4.45 10*6/uL (ref 3.80–5.10)
RDW: 12.7 % (ref 11.0–15.0)
Total Lymphocyte: 37.7 %
WBC: 8 10*3/uL (ref 3.8–10.8)

## 2023-05-30 LAB — HM DIABETES EYE EXAM

## 2023-05-30 NOTE — Telephone Encounter (Signed)
Pt said dr Fabian Sharp asked her if she wanted her to ok for her to use our lab to sch lab order from rheumatologist. Please advise

## 2023-05-31 NOTE — Telephone Encounter (Signed)
Noted  

## 2023-05-31 NOTE — Telephone Encounter (Signed)
Pt call and stated she had her labs done yesterday.

## 2023-05-31 NOTE — Progress Notes (Signed)
Creatinine remains elevated-1.36--stable and GFR low-42. Rest of CMP WNL. Avoid the use of NSAIDs.    Absolute eosinophils are borderline elevated. Rest of CBC WNL.

## 2023-06-06 ENCOUNTER — Telehealth: Payer: Self-pay

## 2023-06-06 ENCOUNTER — Other Ambulatory Visit: Payer: Self-pay

## 2023-06-06 MED ORDER — ACCU-CHEK SOFTCLIX LANCETS MISC: 12 refills | Status: AC

## 2023-06-06 MED ORDER — ACCU-CHEK GUIDE W/DEVICE KIT: 1.0000 | PACK | Freq: Every day | 0 refills | Status: AC

## 2023-06-06 MED ORDER — ACCU-CHEK GUIDE VI STRP: ORAL_STRIP | 12 refills | Status: AC

## 2023-06-06 NOTE — Telephone Encounter (Signed)
Attempted to reach pt. Left a voicemail to call us back.  

## 2023-06-13 DIAGNOSIS — Z6828 Body mass index (BMI) 28.0-28.9, adult: Secondary | ICD-10-CM | POA: Diagnosis not present

## 2023-06-13 DIAGNOSIS — N959 Unspecified menopausal and perimenopausal disorder: Secondary | ICD-10-CM | POA: Diagnosis not present

## 2023-06-13 DIAGNOSIS — Z1231 Encounter for screening mammogram for malignant neoplasm of breast: Secondary | ICD-10-CM | POA: Diagnosis not present

## 2023-06-13 DIAGNOSIS — Z01419 Encounter for gynecological examination (general) (routine) without abnormal findings: Secondary | ICD-10-CM | POA: Diagnosis not present

## 2023-06-28 DIAGNOSIS — K219 Gastro-esophageal reflux disease without esophagitis: Secondary | ICD-10-CM | POA: Diagnosis not present

## 2023-06-28 DIAGNOSIS — R159 Full incontinence of feces: Secondary | ICD-10-CM | POA: Diagnosis not present

## 2023-06-28 DIAGNOSIS — R1031 Right lower quadrant pain: Secondary | ICD-10-CM | POA: Diagnosis not present

## 2023-06-28 DIAGNOSIS — K5900 Constipation, unspecified: Secondary | ICD-10-CM | POA: Diagnosis not present

## 2023-07-02 ENCOUNTER — Ambulatory Visit: Payer: Medicare HMO | Admitting: Podiatry

## 2023-07-02 DIAGNOSIS — D6859 Other primary thrombophilia: Secondary | ICD-10-CM | POA: Diagnosis not present

## 2023-07-03 ENCOUNTER — Ambulatory Visit: Payer: Medicare HMO | Admitting: Podiatry

## 2023-07-18 ENCOUNTER — Other Ambulatory Visit: Payer: Self-pay | Admitting: Physician Assistant

## 2023-07-18 NOTE — Telephone Encounter (Signed)
Last Fill: 05/03/2023  Eye exam: 05/30/2023    Labs: 05/30/2023 Creatinine remains elevated-1.36--stable and GFR low-42. Rest of CMP WNL. Absolute eosinophils are borderline elevated. Rest of CBC WNL.     Next Visit: 10/04/2023  Last Visit: 05/03/2023  UE:[id number] arthritis with rheumatoid factor of multiple sites without organ or systems involvement   Current Dose per office note 05/03/2023: Plaquenil 200 mg 1 tablet by mouth daily   Okay to refill Plaquenil?

## 2023-08-11 ENCOUNTER — Other Ambulatory Visit: Payer: Self-pay | Admitting: Pulmonary Disease

## 2023-08-16 ENCOUNTER — Telehealth: Payer: Self-pay | Admitting: Pulmonary Disease

## 2023-08-16 MED ORDER — AZELASTINE HCL 0.1 % NA SOLN: NASAL | 0 refills | Status: DC

## 2023-08-16 NOTE — Telephone Encounter (Signed)
Astelin nasal spray has been sent to preferred pharmacy.  Patient is aware and voiced her understanding.  Nothing further needed.

## 2023-08-16 NOTE — Telephone Encounter (Signed)
PT states she needs a new RX for  Azelsatine for 28 day supply only for right now. This is all she can afford.    Pharm is [facility] and they told her they need a new RX sent.   [phone] is her number if you have questions

## 2023-08-16 NOTE — Telephone Encounter (Signed)
Ok to order    thanks

## 2023-08-16 NOTE — Telephone Encounter (Signed)
Called and spoke to patient. She is requesting 28 day supply of Astelin 0.1%.   Dr. Judeth Horn, please advise if okay to order? I do not see this medication in last OV note.

## 2023-08-20 DIAGNOSIS — N1832 Chronic kidney disease, stage 3b: Secondary | ICD-10-CM | POA: Diagnosis not present

## 2023-08-22 DIAGNOSIS — N1832 Chronic kidney disease, stage 3b: Secondary | ICD-10-CM | POA: Diagnosis not present

## 2023-08-22 DIAGNOSIS — N2581 Secondary hyperparathyroidism of renal origin: Secondary | ICD-10-CM | POA: Diagnosis not present

## 2023-08-22 DIAGNOSIS — I129 Hypertensive chronic kidney disease with stage 1 through stage 4 chronic kidney disease, or unspecified chronic kidney disease: Secondary | ICD-10-CM | POA: Diagnosis not present

## 2023-08-22 DIAGNOSIS — E1122 Type 2 diabetes mellitus with diabetic chronic kidney disease: Secondary | ICD-10-CM | POA: Diagnosis not present

## 2023-08-22 DIAGNOSIS — D631 Anemia in chronic kidney disease: Secondary | ICD-10-CM | POA: Diagnosis not present

## 2023-08-27 ENCOUNTER — Encounter: Payer: Self-pay | Admitting: Podiatry

## 2023-08-27 ENCOUNTER — Ambulatory Visit: Payer: Medicare HMO | Admitting: Podiatry

## 2023-08-27 DIAGNOSIS — E1151 Type 2 diabetes mellitus with diabetic peripheral angiopathy without gangrene: Secondary | ICD-10-CM | POA: Diagnosis not present

## 2023-08-27 DIAGNOSIS — L84 Corns and callosities: Secondary | ICD-10-CM | POA: Diagnosis not present

## 2023-08-28 NOTE — Progress Notes (Signed)
  Subjective:  [name], female    DOB: [birth year],  MRN: [id number]  Chief Complaint  Patient presents with   Diabetes    "Do my calluses and cut my toenails."    72 y.o. female presents with the above complaint. History confirmed with patient.  Calluses have returned, prior debridement was helpful  Objective:  Physical Exam: warm, good capillary refill, no trophic changes or ulcerative lesions, weakly palpable DP and PT pulses, will hammertoe right second toe there are multiple hyperkeratotic painful lesions as follows: Right fifth toe, second medial toe, submetatarsal 5 and submetatarsal 2: Left submetatarsal 1 and submetatarsal 5.  Assessment:   1. Callus   2. Diabetes mellitus type 2 with peripheral artery disease (HCC)       Plan:  Patient was evaluated and treated and all questions answered.  She had a few toenails that were thickened in certain areas that these were debrided as a courtesy today   All symptomatic hyperkeratoses were safely debrided with a sterile #15 blade to patient's level of comfort without incident. We discussed preventative and palliative care of these lesions including supportive and accommodative shoegear, padding, prefabricated and custom molded accommodative orthoses, use of a pumice stone and lotions/creams daily.    Return in about 3 months (around 11/27/2023) for at risk diabetic foot care.

## 2023-08-29 DIAGNOSIS — R278 Other lack of coordination: Secondary | ICD-10-CM | POA: Diagnosis not present

## 2023-08-29 DIAGNOSIS — R159 Full incontinence of feces: Secondary | ICD-10-CM | POA: Diagnosis not present

## 2023-08-29 DIAGNOSIS — M6281 Muscle weakness (generalized): Secondary | ICD-10-CM | POA: Diagnosis not present

## 2023-08-29 DIAGNOSIS — K5900 Constipation, unspecified: Secondary | ICD-10-CM | POA: Diagnosis not present

## 2023-09-11 ENCOUNTER — Encounter: Payer: Self-pay | Admitting: Pulmonary Disease

## 2023-09-11 ENCOUNTER — Ambulatory Visit: Payer: Medicare HMO | Admitting: Pulmonary Disease

## 2023-09-11 VITALS — BP 120/70 | HR 91 | Ht 63.0 in | Wt 160.0 lb

## 2023-09-11 DIAGNOSIS — J4540 Moderate persistent asthma, uncomplicated: Secondary | ICD-10-CM | POA: Diagnosis not present

## 2023-09-11 DIAGNOSIS — J479 Bronchiectasis, uncomplicated: Secondary | ICD-10-CM | POA: Diagnosis not present

## 2023-09-11 NOTE — Patient Instructions (Addendum)
No changes to medications  Follow up with Dr. Judeth Horn in 6 months or sooner as needed
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
## 2023-09-26 ENCOUNTER — Other Ambulatory Visit: Payer: Self-pay | Admitting: Pulmonary Disease

## 2023-10-04 ENCOUNTER — Ambulatory Visit: Payer: Medicare HMO | Admitting: Physician Assistant

## 2023-10-04 DIAGNOSIS — Z8639 Personal history of other endocrine, nutritional and metabolic disease: Secondary | ICD-10-CM

## 2023-10-04 DIAGNOSIS — M5030 Other cervical disc degeneration, unspecified cervical region: Secondary | ICD-10-CM

## 2023-10-04 DIAGNOSIS — Z79899 Other long term (current) drug therapy: Secondary | ICD-10-CM

## 2023-10-04 DIAGNOSIS — Z96641 Presence of right artificial hip joint: Secondary | ICD-10-CM

## 2023-10-04 DIAGNOSIS — E782 Mixed hyperlipidemia: Secondary | ICD-10-CM

## 2023-10-04 DIAGNOSIS — I7301 Raynaud's syndrome with gangrene: Secondary | ICD-10-CM

## 2023-10-04 DIAGNOSIS — Z8659 Personal history of other mental and behavioral disorders: Secondary | ICD-10-CM

## 2023-10-04 DIAGNOSIS — J479 Bronchiectasis, uncomplicated: Secondary | ICD-10-CM

## 2023-10-04 DIAGNOSIS — Z87891 Personal history of nicotine dependence: Secondary | ICD-10-CM

## 2023-10-04 DIAGNOSIS — M19071 Primary osteoarthritis, right ankle and foot: Secondary | ICD-10-CM

## 2023-10-04 DIAGNOSIS — I10 Essential (primary) hypertension: Secondary | ICD-10-CM

## 2023-10-04 DIAGNOSIS — M0579 Rheumatoid arthritis with rheumatoid factor of multiple sites without organ or systems involvement: Secondary | ICD-10-CM

## 2023-10-04 DIAGNOSIS — M1711 Unilateral primary osteoarthritis, right knee: Secondary | ICD-10-CM

## 2023-10-04 DIAGNOSIS — N1832 Chronic kidney disease, stage 3b: Secondary | ICD-10-CM

## 2023-10-04 DIAGNOSIS — M51369 Other intervertebral disc degeneration, lumbar region without mention of lumbar back pain or lower extremity pain: Secondary | ICD-10-CM

## 2023-10-04 DIAGNOSIS — I471 Supraventricular tachycardia, unspecified: Secondary | ICD-10-CM

## 2023-10-04 DIAGNOSIS — M19042 Primary osteoarthritis, left hand: Secondary | ICD-10-CM

## 2023-10-04 DIAGNOSIS — Z96652 Presence of left artificial knee joint: Secondary | ICD-10-CM

## 2023-10-08 ENCOUNTER — Other Ambulatory Visit: Payer: Self-pay | Admitting: Physician Assistant

## 2023-10-08 NOTE — Telephone Encounter (Signed)
Last Fill: 07/18/2023  Eye exam: 05/30/2023     Labs: 05/30/2023   Creatinine remains elevated-1.36--stable and GFR low-42. Rest of CMP WNL. Avoid the use of NSAIDs.     Absolute eosinophils are borderline elevated. Rest of CBC WNL.    Next Visit: 10/25/2023  Last Visit: 05/03/2023  YQ:[id number] arthritis with rheumatoid factor of multiple sites without organ or systems involvement   Current Dose per office note 05/03/2023: Plaquenil 200 mg 1 tablet by mouth daily-reduced dose due to CKD   Okay to refill Plaquenil?

## 2023-10-09 ENCOUNTER — Ambulatory Visit (INDEPENDENT_AMBULATORY_CARE_PROVIDER_SITE_OTHER): Payer: Medicare HMO | Admitting: Internal Medicine

## 2023-10-09 ENCOUNTER — Encounter: Payer: Self-pay | Admitting: Internal Medicine

## 2023-10-09 VITALS — BP 118/70 | HR 85 | Temp 99.0°F | Ht 63.0 in | Wt 160.6 lb

## 2023-10-09 DIAGNOSIS — I10 Essential (primary) hypertension: Secondary | ICD-10-CM

## 2023-10-09 DIAGNOSIS — Z79899 Other long term (current) drug therapy: Secondary | ICD-10-CM

## 2023-10-09 DIAGNOSIS — E1169 Type 2 diabetes mellitus with other specified complication: Secondary | ICD-10-CM

## 2023-10-09 DIAGNOSIS — E785 Hyperlipidemia, unspecified: Secondary | ICD-10-CM

## 2023-10-09 DIAGNOSIS — N183 Chronic kidney disease, stage 3 unspecified: Secondary | ICD-10-CM

## 2023-10-09 DIAGNOSIS — Z23 Encounter for immunization: Secondary | ICD-10-CM | POA: Diagnosis not present

## 2023-10-09 DIAGNOSIS — M069 Rheumatoid arthritis, unspecified: Secondary | ICD-10-CM

## 2023-10-09 LAB — POCT GLYCOSYLATED HEMOGLOBIN (HGB A1C): Hemoglobin A1C: 5.7 % — AB (ref 4.0–5.6)

## 2023-10-09 NOTE — Patient Instructions (Signed)
Good to see you today . A1c is good  Check urine for protein  Give prenvar 20 today ( updated pneumonia vaccine   )  should not need future pea vaccine unless advice from [facility]dc changes .
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
## 2023-10-10 LAB — MICROALBUMIN / CREATININE URINE RATIO
Creatinine,U: 181.5 mg/dL
Microalb Creat Ratio: 7.3 mg/g (ref 0.0–30.0)
Microalb, Ur: 13.2 mg/dL — ABNORMAL HIGH (ref 0.0–1.9)

## 2023-10-10 NOTE — Progress Notes (Signed)
NO excess protein in urine  reassuring . Forwarding to dr Allena Katz also
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
## 2023-10-14 NOTE — Progress Notes (Signed)
 Normal bone denisty

## 2023-10-25 ENCOUNTER — Ambulatory Visit: Payer: Medicare Other | Attending: Physician Assistant | Admitting: Physician Assistant

## 2023-10-25 ENCOUNTER — Encounter: Payer: Self-pay | Admitting: Physician Assistant

## 2023-10-25 VITALS — BP 115/72 | HR 92 | Resp 14 | Ht 63.0 in | Wt 158.0 lb

## 2023-10-25 DIAGNOSIS — M1711 Unilateral primary osteoarthritis, right knee: Secondary | ICD-10-CM

## 2023-10-25 DIAGNOSIS — I7301 Raynaud's syndrome with gangrene: Secondary | ICD-10-CM

## 2023-10-25 DIAGNOSIS — Z79899 Other long term (current) drug therapy: Secondary | ICD-10-CM | POA: Diagnosis not present

## 2023-10-25 DIAGNOSIS — J479 Bronchiectasis, uncomplicated: Secondary | ICD-10-CM

## 2023-10-25 DIAGNOSIS — M0579 Rheumatoid arthritis with rheumatoid factor of multiple sites without organ or systems involvement: Secondary | ICD-10-CM | POA: Diagnosis not present

## 2023-10-25 DIAGNOSIS — E782 Mixed hyperlipidemia: Secondary | ICD-10-CM

## 2023-10-25 DIAGNOSIS — Z87891 Personal history of nicotine dependence: Secondary | ICD-10-CM

## 2023-10-25 DIAGNOSIS — Z96652 Presence of left artificial knee joint: Secondary | ICD-10-CM

## 2023-10-25 DIAGNOSIS — M5030 Other cervical disc degeneration, unspecified cervical region: Secondary | ICD-10-CM

## 2023-10-25 DIAGNOSIS — I10 Essential (primary) hypertension: Secondary | ICD-10-CM

## 2023-10-25 DIAGNOSIS — M51369 Other intervertebral disc degeneration, lumbar region without mention of lumbar back pain or lower extremity pain: Secondary | ICD-10-CM

## 2023-10-25 DIAGNOSIS — M19041 Primary osteoarthritis, right hand: Secondary | ICD-10-CM | POA: Diagnosis not present

## 2023-10-25 DIAGNOSIS — M19071 Primary osteoarthritis, right ankle and foot: Secondary | ICD-10-CM

## 2023-10-25 DIAGNOSIS — Z8659 Personal history of other mental and behavioral disorders: Secondary | ICD-10-CM

## 2023-10-25 DIAGNOSIS — M19072 Primary osteoarthritis, left ankle and foot: Secondary | ICD-10-CM

## 2023-10-25 DIAGNOSIS — M19042 Primary osteoarthritis, left hand: Secondary | ICD-10-CM

## 2023-10-25 DIAGNOSIS — N1832 Chronic kidney disease, stage 3b: Secondary | ICD-10-CM

## 2023-10-25 DIAGNOSIS — Z8639 Personal history of other endocrine, nutritional and metabolic disease: Secondary | ICD-10-CM

## 2023-10-25 DIAGNOSIS — Z96641 Presence of right artificial hip joint: Secondary | ICD-10-CM

## 2023-10-25 DIAGNOSIS — I471 Supraventricular tachycardia, unspecified: Secondary | ICD-10-CM

## 2023-10-25 MED ORDER — HYDROXYCHLOROQUINE SULFATE 200 MG PO TABS: 200.0000 mg | ORAL_TABLET | Freq: Every day | ORAL | 0 refills | Status: DC

## 2023-10-26 LAB — CBC WITH DIFFERENTIAL/PLATELET
Absolute Lymphocytes: 2598 {cells}/uL (ref 850–3900)
Absolute Monocytes: 672 {cells}/uL (ref 200–950)
Basophils Absolute: 100 {cells}/uL (ref 0–200)
Basophils Relative: 1.2 %
Eosinophils Absolute: 382 {cells}/uL (ref 15–500)
Eosinophils Relative: 4.6 %
HCT: 40.6 % (ref 35.0–45.0)
Hemoglobin: 13.2 g/dL (ref 11.7–15.5)
MCH: 29.6 pg (ref 27.0–33.0)
MCHC: 32.5 g/dL (ref 32.0–36.0)
MCV: 91 fL (ref 80.0–100.0)
MPV: 10.5 fL (ref 7.5–12.5)
Monocytes Relative: 8.1 %
Neutro Abs: 4548 {cells}/uL (ref 1500–7800)
Neutrophils Relative %: 54.8 %
Platelets: 285 10*3/uL (ref 140–400)
RBC: 4.46 10*6/uL (ref 3.80–5.10)
RDW: 12.7 % (ref 11.0–15.0)
Total Lymphocyte: 31.3 %
WBC: 8.3 10*3/uL (ref 3.8–10.8)

## 2023-10-26 LAB — COMPLETE METABOLIC PANEL WITH GFR
AG Ratio: 1.6 (calc) (ref 1.0–2.5)
ALT: 14 U/L (ref 6–29)
AST: 26 U/L (ref 10–35)
Albumin: 4.6 g/dL (ref 3.6–5.1)
Alkaline phosphatase (APISO): 71 U/L (ref 37–153)
BUN/Creatinine Ratio: 12 (calc) (ref 6–22)
BUN: 18 mg/dL (ref 7–25)
CO2: 30 mmol/L (ref 20–32)
Calcium: 9.7 mg/dL (ref 8.6–10.4)
Chloride: 104 mmol/L (ref 98–110)
Creat: 1.54 mg/dL — ABNORMAL HIGH (ref 0.60–1.00)
Globulin: 2.9 g/dL (ref 1.9–3.7)
Glucose, Bld: 88 mg/dL (ref 65–99)
Potassium: 4.1 mmol/L (ref 3.5–5.3)
Sodium: 141 mmol/L (ref 135–146)
Total Bilirubin: 0.5 mg/dL (ref 0.2–1.2)
Total Protein: 7.5 g/dL (ref 6.1–8.1)
eGFR: 36 mL/min/{1.73_m2} — ABNORMAL LOW (ref >=60)

## 2023-10-26 NOTE — Progress Notes (Signed)
 Creatinine is elevated-1.54 and GFR is low-36-avoid NSAIDs. Forward results to nephrologist and PCP as requested.   Rest of CMP WNL. CBC WNL

## 2023-10-30 ENCOUNTER — Ambulatory Visit: Payer: Self-pay | Admitting: Internal Medicine

## 2023-10-30 NOTE — Telephone Encounter (Signed)
 Copied from CRM [id number]. Topic: Clinical - Red Word Triage >> Oct 30, 2023  9:27 AM Macario HERO wrote: Red Word that prompted transfer to Nurse Triage: Patient is requesting a call from Dr. Charlett, she's experiencing pain and in her scalp that is pulling from one side to the other and it wakes her up in the morning and she has to take Tylenol  to get some ease. Pain has been going on for a while but this morning it was really bad.   Chief Complaint: scalp pain Symptoms: possible swelling Frequency: ongoing greater than 1 month but worsened last night Pertinent Negatives: Patient denies fever or itching Disposition: [] ED /[] Urgent Care (no appt availability in office) / [x] Appointment(In office/virtual)/ []  [facility]/ [] Home Care/ [] Refused Recommended Disposition /[] [place] [facility]/ []  Follow-up with PCP Additional Notes: The patient reported that from the middle of her scalp over to her left ear and down to the nape of her neck, it is painful like her hair or scalp is pulling and feels swollen.  She stated that it's hard to determine if it is actual swelling or if it is her hair.  She has had this pain intermittently for the past month.  The pain wakes her from her sleep and she takes Tylenol  and the pain resolves.  Last night the pain occurred and she took Tylenol  but it took longer for the pain to subside.  She declined seeing any other provider besides Dr. Charlett and stated that she could only come after 2 pm due to the cold weather.   Reason for Disposition  [1] Small swelling or lump AND [2] unexplained AND [3] present > 1 week  Answer Assessment - Initial Assessment Questions 1. APPEARANCE of SWELLING: What does it look like?         Unable to determine on scalp 2. SIZE: How large is the swelling? (e.g., inches, cm; or compare to size of pinhead, tip of pen, eraser, coin, pea, grape, ping pong ball)      Unable to determine on scalp  3. LOCATION: Where is the swelling  located?     Scalp  4. ONSET: When did the swelling start?     Greater than 1 month  5. COLOR: What color is it? Is there more than one color?     Unable to assess, in scalp  6. PAIN: Is there any pain? If Yes, ask: How bad is the pain? (e.g., scale 1-10; or mild, moderate, severe)     - NONE (0): no pain   - MILD (1-3): doesn't interfere with normal activities    - MODERATE (4-7): interferes with normal activities or awakens from sleep    - SEVERE (8-10): excruciating pain, unable to do any normal activities     Painful enough to wake patient from sleep  7. ITCH: Does it itch? If Yes, ask: How bad is the itch?      No itching  8. CAUSE: What do you think caused the swelling? 9 OTHER SYMPTOMS: Do you have any other symptoms? (e.g., fever)     none  Protocols used: Skin Lump or Localized Swelling-A-AH

## 2023-10-31 NOTE — Telephone Encounter (Signed)
 Spoke to pt.   Pt reports she didn't have the pain last night. And scheduled to be seen on 11/07/2023 with Dr. Fabian Sharp.

## 2023-11-01 ENCOUNTER — Encounter: Payer: Self-pay | Admitting: Internal Medicine
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
## 2023-11-07 ENCOUNTER — Encounter: Payer: Self-pay | Admitting: Internal Medicine

## 2023-11-07 ENCOUNTER — Ambulatory Visit (INDEPENDENT_AMBULATORY_CARE_PROVIDER_SITE_OTHER): Payer: Medicare Other | Admitting: Internal Medicine

## 2023-11-07 VITALS — BP 130/80 | HR 76 | Temp 98.3°F | Ht 63.0 in | Wt 162.4 lb

## 2023-11-07 DIAGNOSIS — Z79899 Other long term (current) drug therapy: Secondary | ICD-10-CM | POA: Diagnosis not present

## 2023-11-07 DIAGNOSIS — Z981 Arthrodesis status: Secondary | ICD-10-CM

## 2023-11-07 DIAGNOSIS — M069 Rheumatoid arthritis, unspecified: Secondary | ICD-10-CM | POA: Diagnosis not present

## 2023-11-07 DIAGNOSIS — R519 Headache, unspecified: Secondary | ICD-10-CM

## 2023-11-07 NOTE — Patient Instructions (Signed)
 Good to see you today   Considering if your neck is causing to the back of head pain .  Will be contacted about getting and head and neck CT scan . Please calendar  frequency of this head pain  .   Ok to continue tylenol  as needed  Plan fu depending results  Contact team if something worse or concerning .

## 2023-11-27 ENCOUNTER — Telehealth: Payer: Self-pay | Admitting: Internal Medicine

## 2023-11-27 ENCOUNTER — Encounter: Payer: Self-pay | Admitting: Podiatry

## 2023-11-27 ENCOUNTER — Ambulatory Visit (INDEPENDENT_AMBULATORY_CARE_PROVIDER_SITE_OTHER): Payer: Medicare Other | Admitting: Podiatry

## 2023-11-27 DIAGNOSIS — B351 Tinea unguium: Secondary | ICD-10-CM

## 2023-11-27 DIAGNOSIS — L84 Corns and callosities: Secondary | ICD-10-CM | POA: Diagnosis not present

## 2023-11-27 DIAGNOSIS — E1151 Type 2 diabetes mellitus with diabetic peripheral angiopathy without gangrene: Secondary | ICD-10-CM

## 2023-11-27 NOTE — Telephone Encounter (Signed)
 Copied from CRM [id number]. Topic: General - Other >> Nov 27, 2023  1:08 PM Taleah C wrote: Reason for CRM: Ozell from [employer] called and stated that they sent a fax on 11/16/23 to confirm the patient's diagnosis. He explained that its for an application for chronic special needs plan. Please advise at [phone], reference [id number].

## 2023-11-27 NOTE — Progress Notes (Signed)
  Subjective:  [name], female    DOB: [birth year],  MRN: [id number]  Chief Complaint  Patient presents with   Diabetes    Patient states she is here for [facility] , patient states the 2nd toe on right foot may have a Callouse on her 2nd toe.    73 y.o. female presents with the above complaint. History confirmed with patient.  Calluses have returned, prior debridement was helpful.  The nails are also thick and elongated and she is showing increasing discoloration  Objective:  Physical Exam: warm, good capillary refill, no trophic changes or ulcerative lesions, weakly palpable DP and PT pulses, will hammertoe right second toe there are multiple hyperkeratotic painful lesions as follows: Right fifth toe, second medial toe, submetatarsal 5 and submetatarsal 2: Left submetatarsal 1 and submetatarsal 5.  Thickened elongated dystrophic toenails with subungual debris x 10  Assessment:   1. Callus   2. Diabetes mellitus type 2 with peripheral artery disease (HCC)   3. Onychomycosis of multiple toenails with type 2 diabetes mellitus and peripheral angiopathy (HCC)       Plan:  Patient was evaluated and treated and all questions answered.  Discussed the etiology and treatment options for the condition in detail with the patient. Recommended debridement of the nails today. Sharp and mechanical debridement performed of all painful and mycotic nails today. Nails debrided in length and thickness using a nail nipper to level of comfort.   All symptomatic hyperkeratoses were safely debrided with a sterile # 312 blade to patient's level of comfort without incident.  Continue using supportive shoes and urea cream as tolerated    Return in about 10 weeks (around 02/05/2024) for painful thick fungal nails.

## 2023-11-28 NOTE — Telephone Encounter (Signed)
 Spoke to pt regarding to the form.   Pt states she is not aware of the form but she just started with [employer] this year in January.   Inform pt we receive the form regarding to the providing information to her insurance.   Pt states if there is anything else that we need from her, to give her a call.   Form place in provider red's folder.

## 2023-12-04 ENCOUNTER — Telehealth: Payer: Self-pay

## 2023-12-04 NOTE — Telephone Encounter (Signed)
Not sure what this form is about.  And should not be necessary  for care .  You shouldn't need this form for any coverage but   you can ask them why this is needed.   And if you give permission I can fill out and sign.

## 2023-12-04 NOTE — Telephone Encounter (Signed)
Copied from CRM [id number]. Topic: General - Other >> Dec 04, 2023 11:07 AM Corin V wrote: Reason for CRM: Patient received a letter from [facility] stating that they need Dr. Fabian Sharp to submit proof that she has diabetes. The only number on the letter was the customer service number of [phone]

## 2023-12-05 ENCOUNTER — Ambulatory Visit (HOSPITAL_BASED_OUTPATIENT_CLINIC_OR_DEPARTMENT_OTHER)
Admission: RE | Admit: 2023-12-05 | Discharge: 2023-12-05 | Disposition: A | Discharge status: Home / Self Care | Payer: Medicare Other | Source: Ambulatory Visit | Attending: Internal Medicine | Admitting: Internal Medicine

## 2023-12-05 DIAGNOSIS — Z981 Arthrodesis status: Secondary | ICD-10-CM | POA: Diagnosis present

## 2023-12-05 DIAGNOSIS — R519 Headache, unspecified: Secondary | ICD-10-CM

## 2023-12-05 DIAGNOSIS — M069 Rheumatoid arthritis, unspecified: Secondary | ICD-10-CM

## 2023-12-05 DIAGNOSIS — Z79899 Other long term (current) drug therapy: Secondary | ICD-10-CM | POA: Diagnosis present

## 2023-12-06 NOTE — Telephone Encounter (Signed)
Spoke to pt.   Pt states her insurance needs it to make sure pt has diabetes.   Pt states her insurance received information already.   Inform pt if they received information, we don't need to send it.   Pt states she will call them to check.

## 2023-12-12 ENCOUNTER — Telehealth: Payer: Self-pay | Admitting: *Deleted

## 2023-12-12 NOTE — Telephone Encounter (Signed)
Copied from CRM [id number]. Topic: Clinical - Lab/Test Results >> Dec 12, 2023  9:16 AM Pascal Lux wrote: Reason for CRM: Patient called for lab test results for her CT scan.

## 2023-12-13 NOTE — Telephone Encounter (Signed)
Contacted the Radiology Read Room. A representative states it has not been read yet. But will send it off to be read as STAT.   Relay message to pt. Pt verbalize understanding.

## 2023-12-19 LAB — HM DIABETES EYE EXAM

## 2023-12-24 NOTE — Progress Notes (Signed)
 Reading on ct scan shows  infection in deep sinuses  . That could be causing headaches and will need to see ent .  We need to begin on antibiotic  you are not allergic to . And do referral to ENT  This reading was delayed and I was out of office  when results came in     Please contact patient and update  allergy medication list  and how she is doing    we  may add levaquin if allergic to amoxicillin  .  Also  will review back mri and let her know  l

## 2023-12-25 ENCOUNTER — Other Ambulatory Visit: Payer: Self-pay | Admitting: Internal Medicine

## 2023-12-25 ENCOUNTER — Other Ambulatory Visit (HOSPITAL_BASED_OUTPATIENT_CLINIC_OR_DEPARTMENT_OTHER): Payer: Self-pay

## 2023-12-25 DIAGNOSIS — E1151 Type 2 diabetes mellitus with diabetic peripheral angiopathy without gangrene: Secondary | ICD-10-CM

## 2023-12-25 DIAGNOSIS — R519 Headache, unspecified: Secondary | ICD-10-CM

## 2023-12-25 DIAGNOSIS — M359 Systemic involvement of connective tissue, unspecified: Secondary | ICD-10-CM

## 2023-12-25 DIAGNOSIS — J323 Chronic sphenoidal sinusitis: Secondary | ICD-10-CM

## 2023-12-25 MED ORDER — LEVOFLOXACIN 250 MG PO TABS: ORAL_TABLET | ORAL | 0 refills | Status: AC

## 2023-12-25 NOTE — Progress Notes (Signed)
 I sent in the [facility]quin [facility] to [facility]   and make sure ent referral is soon in next week ( asap)

## 2023-12-25 NOTE — Telephone Encounter (Signed)
 Spoke to pt today.   Pt reports her insurance got the information that they needed. No further action is needed.

## 2023-12-26 NOTE — Progress Notes (Unsigned)
 The more specific  diagnosis is  sphenoid sinusitis    and  autoimmune disease and diabetes  Please  make this referral urgent

## 2023-12-26 NOTE — Progress Notes (Signed)
 Referral is sent as per provider request.

## 2024-01-07 ENCOUNTER — Telehealth: Payer: Self-pay | Admitting: Pulmonary Disease

## 2024-01-07 NOTE — Telephone Encounter (Signed)
 [facility] on [address].  She has a new Pharmacy and they said she needs a new RX sent in for her   Azelastine &  Fluticasone. She is completley out. Has been seen in a year.

## 2024-01-07 NOTE — Telephone Encounter (Signed)
 Called and spoke to patient. She is requesting refills on Flonase and Astelin.   Dr. Judeth Horn, please advise on refills. Meds not mentioned in last to office notes.

## 2024-01-08 MED ORDER — AZELASTINE HCL 0.1 % NA SOLN: NASAL | 11 refills | Status: DC

## 2024-01-08 MED ORDER — FLUTICASONE PROPIONATE 50 MCG/ACT NA SUSP: 1.0000 | Freq: Two times a day (BID) | NASAL | 11 refills | Status: AC

## 2024-01-08 NOTE — Telephone Encounter (Signed)
Both refilled

## 2024-01-08 NOTE — Telephone Encounter (Signed)
 Pt is aware of below message and voiced her understanding. Nothing further needed.

## 2024-01-10 ENCOUNTER — Telehealth: Payer: Self-pay | Admitting: *Deleted

## 2024-01-10 ENCOUNTER — Other Ambulatory Visit: Payer: Self-pay | Admitting: Internal Medicine

## 2024-01-10 MED ORDER — MONTELUKAST SODIUM 10 MG PO TABS: ORAL_TABLET | ORAL | 3 refills | Status: AC

## 2024-01-10 MED ORDER — PRAVASTATIN SODIUM 40 MG PO TABS: 40.0000 mg | ORAL_TABLET | Freq: Every day | ORAL | 3 refills | Status: AC

## 2024-01-10 MED ORDER — AMLODIPINE BESYLATE 5 MG PO TABS: ORAL_TABLET | ORAL | 3 refills | Status: AC

## 2024-01-10 NOTE — Telephone Encounter (Signed)
 Spoke to pt. Explain to pt that [employer], NP is our covering provider when Dr. Fabian Sharp is out, that's why her name is on the prescription.    Pt states she changed her insurance and now she uses [facility] on [address], no longer with [facility]. Pt request to send a refill for Singular, Amlodipine and Pravastatin. Rx are sent.    [facility] is removed from pt's pharmacy list.

## 2024-01-10 NOTE — Telephone Encounter (Signed)
 Spoke to pt today. She states she doesn't need a refill on this Rx at the moment.

## 2024-01-10 NOTE — Telephone Encounter (Signed)
 Copied from CRM [id number]. Topic: Clinical - Prescription Issue >> Jan 10, 2024  8:42 AM Alphonzo Lemmings O wrote: Reason for CRM: patient is asking about her pravastatin and montelukast (SINGULAIR) 10 MG tablet and the prescriber for that medication shows padonda webb and patient doesn't know who that is and why she seen her and why the bottle her her name on there . Patient is requesting that the doctor look into this for her and please let her know [phone]

## 2024-01-10 NOTE — Telephone Encounter (Signed)
 Copied from CRM [id number]. Topic: Clinical - Medication Refill >> Jan 10, 2024  8:35 AM Dimitri Ped wrote: Most Recent Primary Care Visit:  Provider: Madelin Headings  Department: [facility]  Visit Type: OFFICE VISIT  Date: 11/07/2023  Medication: famotidine (PEPCID) 40 MG tablet   Has the patient contacted their pharmacy? Yes switching pharmacies (Agent: If no, request that the patient contact the pharmacy for the refill. If patient does not wish to contact the pharmacy document the reason why and proceed with request.) (Agent: If yes, when and what did the pharmacy advise?)  Is this the correct pharmacy for this prescription? Yes If no, delete pharmacy and type the correct one.  This is the patient's preferred pharmacy:  [facility] [phone] - [place], [place] - [address] AT [facility] [address]  [place] [address] Phone: [phone] Fax: [phone]  [facility] - [place], [place] - [address] [place] [address] Phone: [phone] Fax: [phone]   Has the prescription been filled recently? No  Is the patient out of the medication? Yes  Has the patient been seen for an appointment in the last year OR does the patient have an upcoming appointment? Yes  Can we respond through MyChart? No  Agent: Please be advised that Rx refills may take up to 3 business days. We ask that you follow-up with your pharmacy.

## 2024-02-05 ENCOUNTER — Ambulatory Visit: Payer: Medicare Other | Admitting: Podiatry

## 2024-02-08 ENCOUNTER — Other Ambulatory Visit: Payer: Self-pay | Admitting: Physician Assistant

## 2024-02-08 NOTE — Telephone Encounter (Signed)
 Last Fill: 10/25/2023  Eye exam: 05/30/2023   Labs: 10/25/2023 Creatinine is elevated-1.54 and GFR is low-36-avoid NSAIDs. Rest of CMP WNL. CBC WNL  Next Visit: 04/02/2024  Last Visit: 10/25/2023  DX: Rheumatoid arthritis with rheumatoid factor of multiple sites without organ or systems involvement   Current Dose per office note on 10/25/2023: Plaquenil  200 mg 1 tablet by mouth daily-reduced dose due to CKD.   Okay to refill Plaquenil ?

## 2024-02-11 ENCOUNTER — Other Ambulatory Visit: Payer: Self-pay | Admitting: Pulmonary Disease

## 2024-02-11 MED ORDER — BREZTRI AEROSPHERE 160-9-4.8 MCG/ACT IN AERO: 2.0000 | INHALATION_SPRAY | Freq: Two times a day (BID) | RESPIRATORY_TRACT | 11 refills | Status: AC

## 2024-02-11 NOTE — Telephone Encounter (Signed)
 Copied from CRM [id number]. Topic: Clinical - Medication Refill >> Feb 11, 2024 12:46 PM Ambrose Junk wrote: Most Recent Primary Care Visit:  Provider: Reginal Capra  Department: [facility]  Visit Type: OFFICE VISIT  Date: 11/07/2023  Medication: Budeson-Glycopyrrol-Formoterol  (BREZTRI  AEROSPHERE) 160-9-4.8 MCG/ACT AERO  Has the patient contacted their pharmacy? Yes (Agent: If no, request that the patient contact the pharmacy for the refill. If patient does not wish to contact the pharmacy document the reason why and proceed with request.) (Agent: If yes, when and what did the pharmacy advise?)  Is this the correct pharmacy for this prescription? Yes If no, delete pharmacy and type the correct one.  This is the patient's preferred pharmacy:   [facility] [phone] - [place], [place] - [address] AT [facility] [address] [place] [address] Phone: [phone] Fax: [phone]   Has the prescription been filled recently? Yes  Is the patient out of the medication? Yes  Has the patient been seen for an appointment in the last year OR does the patient have an upcoming appointment? Yes  Can we respond through MyChart? No  Agent: Please be advised that Rx refills may take up to 3 business days. We ask that you follow-up with your pharmacy.

## 2024-02-11 NOTE — Progress Notes (Signed)
 Please give follow up  on how she is doing.  her back x ray shows the arthritis in the spine  and surgery changes  and a stone in left kidney . However I was very concerned about the sphenoid sinusitis on her head ct . Did she ever Get her  ent evaluation that I had requested  ? How is her HA   I see no ENT notes

## 2024-02-13 ENCOUNTER — Telehealth: Payer: Self-pay

## 2024-02-13 NOTE — Telephone Encounter (Signed)
 Copied from CRM [id number]. Topic: Clinical - Lab/Test Results >> Feb 13, 2024  4:28 PM [name] wrote: Reason for BJY:NWGNFAO is calling to speak with Dr.[name]'s nurse about her CT. Patient stated that she had the ct some months ago but she received a call yesterday and never heard anything back.

## 2024-02-13 NOTE — Telephone Encounter (Signed)
 Copied from CRM [id number]. Topic: Clinical - Lab/Test Results >> Feb 12, 2024  5:10 PM Desiree [name] wrote: Reason for CRM: THE PT WAS RETURNING CALL TO DR [name]'S NURSE ABOUT HER CT.

## 2024-02-14 NOTE — Telephone Encounter (Signed)
 Spoke to pt. See lab result encounter.

## 2024-02-14 NOTE — Telephone Encounter (Signed)
 Spoke to pt. See result note encounter.

## 2024-02-15 ENCOUNTER — Telehealth: Payer: Self-pay | Admitting: Rheumatology

## 2024-02-15 ENCOUNTER — Other Ambulatory Visit: Payer: Self-pay | Admitting: *Deleted

## 2024-02-15 DIAGNOSIS — M5030 Other cervical disc degeneration, unspecified cervical region: Secondary | ICD-10-CM

## 2024-02-15 DIAGNOSIS — M51369 Other intervertebral disc degeneration, lumbar region without mention of lumbar back pain or lower extremity pain: Secondary | ICD-10-CM

## 2024-02-15 NOTE — Telephone Encounter (Signed)
I called patient, referral placed 

## 2024-02-15 NOTE — Telephone Encounter (Signed)
 I reviewed the CT results.  She has multilevel disc disease and moderate to spinal stenosis.  I would recommend following up with the neurosurgeon.

## 2024-02-15 NOTE — Telephone Encounter (Signed)
 Returned call to patient. She states she has a CT scan on 12/05/2023, (results in epic). Patient states she would like for you to review and advised of any recommendations that you have. She states her PCP advised he yesterday that she has arthritis in her spine.

## 2024-02-15 NOTE — Telephone Encounter (Signed)
 Patient left a voicemail stating she received a call from her PCP with the results from her CT scan.  Patient states they found arthritis on her spine.  Patient requested a return call.

## 2024-02-19 ENCOUNTER — Telehealth: Payer: Self-pay

## 2024-02-19 NOTE — Telephone Encounter (Signed)
 Copied from CRM [id number]. Topic: Clinical - Medication Question >> Feb 19, 2024  2:57 PM Juleen Oakland F wrote: Reason for CRM: Patient request call back at [phone] wants to know she's taking any blood pressure medications, and if so, what is the name of the blood pressure medication?

## 2024-02-21 ENCOUNTER — Other Ambulatory Visit (HOSPITAL_COMMUNITY): Payer: Self-pay

## 2024-02-21 ENCOUNTER — Telehealth: Payer: Self-pay

## 2024-02-21 NOTE — Telephone Encounter (Signed)
 Copied from CRM [id number]. Topic: Clinical - Prescription Issue >> Feb 20, 2024  8:26 AM Crist Dominion wrote: Reason for CRM: Patient states she does not have $800 to pay for her Breztri  - patient is seeking an alternative.  Routing to pharm. Can you please advise of formulary alternatives for Breztri 

## 2024-02-21 NOTE — Telephone Encounter (Signed)
 Spoke with pt. Informed her of pharmacy note. Pt is going to try to pick inhaler up. Nothing further needed at this time.

## 2024-02-21 NOTE — Telephone Encounter (Signed)
 Per test claim, patient has signed up for the Medicare Payment Plan making the copay $47.00 at this time.

## 2024-02-22 ENCOUNTER — Telehealth: Payer: Self-pay

## 2024-02-22 NOTE — Telephone Encounter (Signed)
 Copied from CRM [id number]. Topic: General - Call Back - No Documentation >> Feb 22, 2024 12:03 PM Desiree [name] wrote: Reason for CRM: Patient called stating she was returning a call from Dr MA on today I don't see anything in chart from today can someone please reach out to patient

## 2024-02-22 NOTE — Telephone Encounter (Signed)
 Attempted to reach pt. Left a voicemail to call us back.

## 2024-02-22 NOTE — Telephone Encounter (Signed)
 Spoke to pt. Inform pt she is taking amlodipine  and good on the refill. Pt has no further questions.

## 2024-02-22 NOTE — Telephone Encounter (Signed)
 Patient is needing a call back regarding a call from capri

## 2024-02-25 NOTE — Telephone Encounter (Signed)
 See notes encounter from Friday.   Follow up with pt as she mention she was waiting at pharmacy on Friday for her medication.    Pt states she her medication was fine. And states she will call Dr. Lydia Sams and inform her that she is taking Amlodipine  since Dr. Lydia Sams had inform her that they didn't see it on her current med list. Pt updates that she received a call about her neurosurgey referrel. They had scheduled an appt for 5/15.   Pt reports that she was feeling dizzy this morning- couldn't stand and was feeling nauseous. No vomitting. She continues that she has to sit on her commode for a while before had a bowel movement. Pt states after she went back to bed and lay down. She feels better. Was able to get up and made some coffee and eat.   Pt added she did have this symptoms before about 2 wks ago But wasn't feeling nauseous. Pt inform she is having a little headache and always having sinus issues.   Ask for pt if someone from ENT had contacted her yet. Pt denied.   Sent message to Lakira via team, referral coordinator,  to check up on the referral.   Inform pt, we will give pt an update.
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
## 2024-03-23 ENCOUNTER — Other Ambulatory Visit: Payer: Self-pay | Admitting: Pulmonary Disease

## 2024-04-02 ENCOUNTER — Encounter: Payer: Self-pay | Admitting: Rheumatology

## 2024-04-02 ENCOUNTER — Ambulatory Visit: Payer: Medicare Other | Attending: Rheumatology | Admitting: Rheumatology

## 2024-04-02 VITALS — BP 131/76 | HR 86 | Resp 16 | Ht 63.0 in | Wt 159.0 lb

## 2024-04-02 DIAGNOSIS — N1832 Chronic kidney disease, stage 3b: Secondary | ICD-10-CM

## 2024-04-02 DIAGNOSIS — M5030 Other cervical disc degeneration, unspecified cervical region: Secondary | ICD-10-CM

## 2024-04-02 DIAGNOSIS — E782 Mixed hyperlipidemia: Secondary | ICD-10-CM

## 2024-04-02 DIAGNOSIS — I471 Supraventricular tachycardia, unspecified: Secondary | ICD-10-CM

## 2024-04-02 DIAGNOSIS — Z96652 Presence of left artificial knee joint: Secondary | ICD-10-CM

## 2024-04-02 DIAGNOSIS — M19041 Primary osteoarthritis, right hand: Secondary | ICD-10-CM | POA: Diagnosis not present

## 2024-04-02 DIAGNOSIS — I10 Essential (primary) hypertension: Secondary | ICD-10-CM

## 2024-04-02 DIAGNOSIS — M1711 Unilateral primary osteoarthritis, right knee: Secondary | ICD-10-CM

## 2024-04-02 DIAGNOSIS — M19071 Primary osteoarthritis, right ankle and foot: Secondary | ICD-10-CM

## 2024-04-02 DIAGNOSIS — M0579 Rheumatoid arthritis with rheumatoid factor of multiple sites without organ or systems involvement: Secondary | ICD-10-CM | POA: Diagnosis not present

## 2024-04-02 DIAGNOSIS — Z96641 Presence of right artificial hip joint: Secondary | ICD-10-CM

## 2024-04-02 DIAGNOSIS — I7301 Raynaud's syndrome with gangrene: Secondary | ICD-10-CM

## 2024-04-02 DIAGNOSIS — M19042 Primary osteoarthritis, left hand: Secondary | ICD-10-CM

## 2024-04-02 DIAGNOSIS — J479 Bronchiectasis, uncomplicated: Secondary | ICD-10-CM

## 2024-04-02 DIAGNOSIS — M51369 Other intervertebral disc degeneration, lumbar region without mention of lumbar back pain or lower extremity pain: Secondary | ICD-10-CM

## 2024-04-02 DIAGNOSIS — Z8659 Personal history of other mental and behavioral disorders: Secondary | ICD-10-CM

## 2024-04-02 DIAGNOSIS — Z87891 Personal history of nicotine dependence: Secondary | ICD-10-CM

## 2024-04-02 DIAGNOSIS — Z79899 Other long term (current) drug therapy: Secondary | ICD-10-CM

## 2024-04-02 DIAGNOSIS — M19072 Primary osteoarthritis, left ankle and foot: Secondary | ICD-10-CM

## 2024-04-02 DIAGNOSIS — Z8639 Personal history of other endocrine, nutritional and metabolic disease: Secondary | ICD-10-CM

## 2024-04-02 NOTE — Patient Instructions (Signed)
Vaccines You are taking a medication(s) that can suppress your immune system.  The following immunizations are recommended: Flu annually Covid-19  Td/Tdap (tetanus, diphtheria, pertussis) every 10 years Pneumonia (Prevnar 15 then Pneumovax 23 at least 1 year apart.  Alternatively, can take Prevnar 20 without needing additional dose) Shingrix: 2 doses from 4 weeks to 6 months apart  Please check with your PCP to make sure you are up to date.   Hand Exercises Hand exercises can be helpful for almost anyone. They can strengthen your hands and improve flexibility and movement. The exercises can also increase blood flow to the hands. These results can make your work and daily tasks easier for you. Hand exercises can be especially helpful for people who have joint pain from arthritis or nerve damage from using their hands over and over. These exercises can also help people who injure a hand. Exercises Most of these hand exercises are gentle stretching and motion exercises. It is usually safe to do them often throughout the day. Warming up your hands before exercise may help reduce stiffness. You can do this with gentle massage or by placing your hands in warm water for 10-15 minutes. It is normal to feel some stretching, pulling, tightness, or mild discomfort when you begin new exercises. In time, this will improve. Remember to always be careful and stop right away if you feel sudden, very bad pain or your pain gets worse. You want to get better and be safe. Ask your health care provider which exercises are safe for you. Do exercises exactly as told by your provider and adjust them as told. Do not begin these exercises until told by your provider. Knuckle bend or "claw" fist  Stand or sit with your arm, hand, and all five fingers pointed straight up. Make sure to keep your wrist straight. Gently bend your fingers down toward your palm until the tips of your fingers are touching your palm. Keep your  big knuckle straight and only bend the small knuckles in your fingers. Hold this position for 10 seconds. Straighten your fingers back to your starting position. Repeat this exercise 5-10 times with each hand. Full finger fist  Stand or sit with your arm, hand, and all five fingers pointed straight up. Make sure to keep your wrist straight. Gently bend your fingers into your palm until the tips of your fingers are touching the middle of your palm. Hold this position for 10 seconds. Extend your fingers back to your starting position, stretching every joint fully. Repeat this exercise 5-10 times with each hand. Straight fist  Stand or sit with your arm, hand, and all five fingers pointed straight up. Make sure to keep your wrist straight. Gently bend your fingers at the big knuckle, where your fingers meet your hand, and at the middle knuckle. Keep the knuckle at the tips of your fingers straight and try to touch the bottom of your palm. Hold this position for 10 seconds. Extend your fingers back to your starting position, stretching every joint fully. Repeat this exercise 5-10 times with each hand. Tabletop  Stand or sit with your arm, hand, and all five fingers pointed straight up. Make sure to keep your wrist straight. Gently bend your fingers at the big knuckle, where your fingers meet your hand, as far down as you can. Keep the small knuckles in your fingers straight. Think of forming a tabletop with your fingers. Hold this position for 10 seconds. Extend your fingers back to your  starting position, stretching every joint fully. Repeat this exercise 5-10 times with each hand. Finger spread  Place your hand flat on a table with your palm facing down. Make sure your wrist stays straight. Spread your fingers and thumb apart from each other as far as you can until you feel a gentle stretch. Hold this position for 10 seconds. Bring your fingers and thumb tight together again. Hold this  position for 10 seconds. Repeat this exercise 5-10 times with each hand. Making circles  Stand or sit with your arm, hand, and all five fingers pointed straight up. Make sure to keep your wrist straight. Make a circle by touching the tip of your thumb to the tip of your index finger. Hold for 10 seconds. Then open your hand wide. Repeat this motion with your thumb and each of your fingers. Repeat this exercise 5-10 times with each hand. Thumb motion  Sit with your forearm resting on a table and your wrist straight. Your thumb should be facing up toward the ceiling. Keep your fingers relaxed as you move your thumb. Lift your thumb up as high as you can toward the ceiling. Hold for 10 seconds. Bend your thumb across your palm as far as you can, reaching the tip of your thumb for the small finger (pinkie) side of your palm. Hold for 10 seconds. Repeat this exercise 5-10 times with each hand. Grip strengthening  Hold a stress ball or other soft ball in the middle of your hand. Slowly increase the pressure, squeezing the ball as much as you can without causing pain. Think of bringing the tips of your fingers into the middle of your palm. All of your finger joints should bend when doing this exercise. Hold your squeeze for 10 seconds, then relax. Repeat this exercise 5-10 times with each hand. Contact a health care provider if: Your hand pain or discomfort gets much worse when you do an exercise. Your hand pain or discomfort does not improve within 2 hours after you exercise. If you have either of these problems, stop doing these exercises right away. Do not do them again unless your provider says that you can. Get help right away if: You develop sudden, severe hand pain or swelling. If this happens, stop doing these exercises right away. Do not do them again unless your provider says that you can. This information is not intended to replace advice given to you by your health care provider. Make  sure you discuss any questions you have with your health care provider. Document Revised: 10/24/2022 Document Reviewed: 10/24/2022 Elsevier Patient Education  2024 [employer].

## 2024-04-03 ENCOUNTER — Ambulatory Visit: Payer: Self-pay | Admitting: Rheumatology

## 2024-04-03 LAB — CBC WITH DIFFERENTIAL/PLATELET
Absolute Lymphocytes: 2624 {cells}/uL (ref 850–3900)
Absolute Monocytes: 621 {cells}/uL (ref 200–950)
Basophils Absolute: 77 {cells}/uL (ref 0–200)
Basophils Relative: 1.2 %
Eosinophils Absolute: 538 {cells}/uL — ABNORMAL HIGH (ref 15–500)
Eosinophils Relative: 8.4 %
HCT: 38.9 % (ref 35.0–45.0)
Hemoglobin: 12.5 g/dL (ref 11.7–15.5)
MCH: 29.2 pg (ref 27.0–33.0)
MCHC: 32.1 g/dL (ref 32.0–36.0)
MCV: 90.9 fL (ref 80.0–100.0)
MPV: 10.4 fL (ref 7.5–12.5)
Monocytes Relative: 9.7 %
Neutro Abs: 2541 {cells}/uL (ref 1500–7800)
Neutrophils Relative %: 39.7 %
Platelets: 220 10*3/uL (ref 140–400)
RBC: 4.28 10*6/uL (ref 3.80–5.10)
RDW: 13.5 % (ref 11.0–15.0)
Total Lymphocyte: 41 %
WBC: 6.4 10*3/uL (ref 3.8–10.8)

## 2024-04-03 LAB — COMPREHENSIVE METABOLIC PANEL WITH GFR
AG Ratio: 1.6 (calc) (ref 1.0–2.5)
ALT: 12 U/L (ref 6–29)
AST: 20 U/L (ref 10–35)
Albumin: 4.2 g/dL (ref 3.6–5.1)
Alkaline phosphatase (APISO): 68 U/L (ref 37–153)
BUN/Creatinine Ratio: 14 (calc) (ref 6–22)
BUN: 19 mg/dL (ref 7–25)
CO2: 30 mmol/L (ref 20–32)
Calcium: 9.4 mg/dL (ref 8.6–10.4)
Chloride: 104 mmol/L (ref 98–110)
Creat: 1.32 mg/dL — ABNORMAL HIGH (ref 0.60–1.00)
Globulin: 2.7 g/dL (ref 1.9–3.7)
Glucose, Bld: 81 mg/dL (ref 65–99)
Potassium: 4.1 mmol/L (ref 3.5–5.3)
Sodium: 139 mmol/L (ref 135–146)
Total Bilirubin: 0.4 mg/dL (ref 0.2–1.2)
Total Protein: 6.9 g/dL (ref 6.1–8.1)
eGFR: 43 mL/min/{1.73_m2} — ABNORMAL LOW (ref >=60)

## 2024-04-03 LAB — LIPID PANEL
Cholesterol: 143 mg/dL (ref <200)
HDL: 52 mg/dL (ref >=50)
LDL Cholesterol (Calc): 73 mg/dL
Non-HDL Cholesterol (Calc): 91 mg/dL (ref <130)
Total CHOL/HDL Ratio: 2.8 (calc) (ref <5.0)
Triglycerides: 99 mg/dL (ref <150)

## 2024-04-03 NOTE — Progress Notes (Signed)
 CBC and CMP is stable.  Creatinine remains elevated.  Lipid panel is normal.  Please forward results to you.

## 2024-04-14 ENCOUNTER — Other Ambulatory Visit: Payer: Self-pay | Admitting: Internal Medicine

## 2024-04-14 MED ORDER — FAMOTIDINE 40 MG PO TABS: 40.0000 mg | ORAL_TABLET | Freq: Every day | ORAL | 3 refills | Status: AC

## 2024-04-14 NOTE — Telephone Encounter (Signed)
 Copied from CRM [id number]. Topic: Clinical - Medication Refill >> Apr 14, 2024  9:09 AM Tiffany S wrote: Medication: famotidine  (PEPCID ) 40 MG tablet [[id number]]  Has the patient contacted their pharmacy? Yes (Agent: If no, request that the patient contact the pharmacy for the refill. If patient does not wish to contact the pharmacy document the reason why and proceed with request.) (Agent: If yes, when and what did the pharmacy advise?)  This is the patient's preferred pharmacy:  [facility] [phone] - [place], [place] - [address] AT [facility] [address] [place] [address] Phone: [phone] Fax: [phone]  Is this the correct pharmacy for this prescription? Yes If no, delete pharmacy and type the correct one.   Has the prescription been filled recently? Yes  Is the patient out of the medication? Yes  Has the patient been seen for an appointment in the last year OR does the patient have an upcoming appointment? Yes  Can we respond through MyChart? No  Agent: Please be advised that Rx refills may take up to 3 business days. We ask that you follow-up with your pharmacy.

## 2024-04-15 ENCOUNTER — Ambulatory Visit: Payer: Self-pay | Admitting: Pulmonary Disease

## 2024-04-15 ENCOUNTER — Ambulatory Visit (INDEPENDENT_AMBULATORY_CARE_PROVIDER_SITE_OTHER): Payer: Medicare HMO | Admitting: Internal Medicine

## 2024-04-15 ENCOUNTER — Encounter: Payer: Self-pay | Admitting: Internal Medicine

## 2024-04-15 VITALS — BP 128/78 | HR 71 | Temp 98.6°F | Ht 63.0 in | Wt 157.7 lb

## 2024-04-15 DIAGNOSIS — Z79899 Other long term (current) drug therapy: Secondary | ICD-10-CM | POA: Diagnosis not present

## 2024-04-15 DIAGNOSIS — Z0000 Encounter for general adult medical examination without abnormal findings: Secondary | ICD-10-CM | POA: Diagnosis not present

## 2024-04-15 DIAGNOSIS — R197 Diarrhea, unspecified: Secondary | ICD-10-CM

## 2024-04-15 DIAGNOSIS — M069 Rheumatoid arthritis, unspecified: Secondary | ICD-10-CM | POA: Diagnosis not present

## 2024-04-15 DIAGNOSIS — N183 Chronic kidney disease, stage 3 unspecified: Secondary | ICD-10-CM

## 2024-04-15 DIAGNOSIS — E1169 Type 2 diabetes mellitus with other specified complication: Secondary | ICD-10-CM | POA: Diagnosis not present

## 2024-04-15 DIAGNOSIS — J449 Chronic obstructive pulmonary disease, unspecified: Secondary | ICD-10-CM

## 2024-04-15 DIAGNOSIS — R61 Generalized hyperhidrosis: Secondary | ICD-10-CM

## 2024-04-15 LAB — POCT GLYCOSYLATED HEMOGLOBIN (HGB A1C): Hemoglobin A1C: 5.6 % (ref 4.0–5.6)

## 2024-04-15 NOTE — Patient Instructions (Addendum)
 Ask Dr.  Kristie about linzess  dosing   because of the frequent loose stools.  Fu for sinus infection  sounds like you are  better  but advise  sinus ent follow up. It looks like you have an appt in July.    Since  you are  now off the amlodipine    ( may have helped the raynauds  but seems ok today ) Fu with  Dr D if  raynaud es flaring   Try adding the nebulizer once or twice a day for up to a week to see if optimized the  wheezing and if not then get back with  the lung pulmonary team .   Plan fu in 6 months or as needed

## 2024-04-15 NOTE — Telephone Encounter (Signed)
 Copied from CRM [id number]. Topic: Clinical - Red Word Triage >> Apr 15, 2024  4:25 PM Leila BROCKS wrote: Red Word that prompted transfer to Nurse Triage: Patient [phone] states saw pcp Dr. Charlett today and was advised patient to see Dr. Annella for wheezing, dizziness, coughing phlegm, sneezing, and sweating a lot at night. Patient denies pain, nor fever. Please advise.    ----------------------------------------------------------------------- From previous Reason for Contact - Scheduling: Patient/patient representative is calling to schedule an appointment. Refer to attachments for appointment information.    FYI Only or Action Required?: FYI only for provider.  Patient is followed in Pulmonology for COPD, last seen on 09/11/2023 by Hunsucker, Donnice SAUNDERS, MD. Called Nurse Triage reporting Wheezing. Symptoms began today. Interventions attempted: Nothing. Symptoms are: stable.  Triage Disposition: See PCP Within 2 Weeks  Patient/caregiver understands and will follow disposition?: Yes    Reason for Disposition  [1] MILD longstanding difficulty breathing AND [2]  SAME as normal  Answer Assessment - Initial Assessment Questions Patient seen by PCP today who informed her she heard wheezing on exam. Patient denies any shortness of breath. First available appointment scheduled and added to wait list. Patient instructed to call back for new or worsening symptoms. Patient verbalized understanding and agreement with this plan.        1. RESPIRATORY STATUS: Describe your breathing? (e.g., wheezing, shortness of breath, unable to speak, severe coughing)      Wheezing heard during PCP appointment today  2. ONSET: When did this breathing problem begin?      Wheezing heard today  4. SEVERITY: How bad is your breathing? (e.g., mild, moderate, severe)    - MILD: No SOB at rest, mild SOB with walking, speaks normally in sentences, can lie down, no retractions, pulse < 100.    - MODERATE:  SOB at rest, SOB with minimal exertion and prefers to sit, cannot lie down flat, speaks in phrases, mild retractions, audible wheezing, pulse 100-120.    - SEVERE: Very SOB at rest, speaks in single words, struggling to breathe, sitting hunched forward, retractions, pulse > 120      Denies shortness of breath  6. CARDIAC HISTORY: Do you have any history of heart disease? (e.g., heart attack, angina, bypass surgery, angioplasty)      Yes 7. LUNG HISTORY: Do you have any history of lung disease?  (e.g., pulmonary embolus, asthma, emphysema)     Yes 9. OTHER SYMPTOMS: Do you have any other symptoms? (e.g., dizziness, runny nose, cough, chest pain, fever)     Cough, sneezing  10. O2 SATURATION MONITOR:  Do you use an oxygen  saturation monitor (pulse oximeter) at home? If Yes, ask: What is your reading (oxygen  level) today? What is your usual oxygen  saturation reading? (e.g., 95%)       97% in PCP office today  Protocols used: Breathing Difficulty-A-AH

## 2024-04-15 NOTE — Telephone Encounter (Signed)
 FYI
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
## 2024-04-16 LAB — OPHTHALMOLOGY REPORT-SCANNED

## 2024-04-17 ENCOUNTER — Telehealth: Payer: Self-pay

## 2024-04-17 NOTE — Telephone Encounter (Signed)
   Pre-operative Risk Assessment    Patient Name: Desiree [name]  DOB: 09/18/1951 MRN: [id number]   Date of last office visit: 04/05/23 [facility], MD Date of next office visit: 07/23/24 MAUDE EMMER, MD   Request for Surgical Clearance    Procedure:  COLONOSCOPY  Date of Surgery:  Clearance 05/21/24                                Surgeon:  DR KRISTIE Surgeon's Group or Practice Name:  [facility], [place] Phone number:  [phone] Fax number:  [phone]   Type of Clearance Requested:   - Medical    Type of Anesthesia:  PROPOFOL    Additional requests/questions:    Signed, Lucie DELENA Ku   04/17/2024, 12:11 PM

## 2024-04-17 NOTE — Telephone Encounter (Signed)
   Name: Desiree [name]  DOB: [birth year]  MRN: [id number]  Primary Cardiologist: Maude Emmer, MD  Chart reviewed as part of pre-operative protocol coverage. Because of Desiree [name] past medical history and time since last visit, she will require a follow-up in-office visit in order to better assess preoperative cardiovascular risk.  Pre-op covering staff: - Please schedule appointment and call patient to inform them. If patient already had an upcoming appointment within acceptable timeframe, please add pre-op clearance to the appointment notes so provider is aware. - Please contact requesting surgeon's office via preferred method (i.e, phone, fax) to inform them of need for appointment prior to surgery.  Desiree Vigeant D Liandra Mendia, NP  04/17/2024, 12:34 PM

## 2024-04-17 NOTE — Telephone Encounter (Signed)
 Pt has been scheduled in office appt 04/29/24 with Olivia Pavy, PAC. Pt will keep her 07/2024 appt with Dr. Nishan.

## 2024-04-28 NOTE — Progress Notes (Unsigned)
 Cardiology Office Note:  .   Date:  04/29/2024  ID:  Desiree [name], DOB 1951-06-01, MRN [id number] PCP: Charlett Apolinar POUR, MD  [facility] Providers Cardiologist:  Maude Emmer, MD    History of Present Illness: Desiree [name] is a 73 y.o. female  with a hx of HCM with no prior LVOT gradient, and 20 mm.   2023; unable to walk for stress echo.   Zio patch performed with PVCs and SVT.   Patient was here last 04/05/23 to see Dr. Santo. And zio ordered -few episodes SVT  Preop clearance for colonoscopy 05/21/24 by Dr. Kristie. Patient has chronic DOE from COPD and asthma relieved with inhalers. Amlodipine  stopped by PCP b/c of low BP. No significant chest pain. Says she's used to her heart beating the way it does but can't be more descriptive. Not very active but does her housework.   ROS:    Studies Reviewed: Desiree    EKG Interpretation Date/Time:  Tuesday April 29 2024 11:35:24 EDT Ventricular Rate:  80 PR Interval:  132 QRS Duration:  78 QT Interval:  388 QTC Calculation: 447 R Axis:   -61  Text Interpretation: Normal sinus rhythm Left axis deviation Septal infarct (cited on or before 14-Oct-2018) When compared with ECG of 13-Jul-2022 16:10, No significant change was found Confirmed by Parthenia Klinefelter [phone]) on 04/29/2024 11:41:26 AM    Prior CV Studies:   Monitor 03/2023 Patient had a minimum heart rate of 56 bpm, maximum heart rate of 203 bpm, and average heart rate of 87 bpm.   Predominant underlying rhythm was sinus rhythm.   Few episodes of asymptomatic SVT, longest 14 seconds. There is artifact and atrial activity during these episodes.   Isolated PACs were rare (<1.0%).   Isolated PVCs were rare (<1.0%).   No evidence of significant heart block.   Triggered and diary events associated with sinus, PACs, and PVCs.   No malignant arrhythmias or atrial fibrillation.  Echo 2023   IMPRESSIONS     1. Peak LVOT gradient 10 mmHg with valsalva. Left ventricular ejection   fraction, by estimation, is 65 to 70%. The left ventricle has normal  function. The left ventricle has no regional wall motion abnormalities.  There is severe asymmetric left  ventricular hypertrophy of the basal-septal segment. Findings consistent  with hypertrophic cardiomyopathy. No dynamic LVOT obstruction wtih  valsalva. The patient was unable to exercise for the stress portion. Left  ventricular diastolic parameters are  consistent with Grade I diastolic dysfunction (impaired relaxation).   2. The mitral valve is abnormal. Trivial mitral valve regurgitation.   Comparison(s): No significant change from prior study. 05/31/20: LVEF  70-75%.    Risk Assessment/Calculations:             Physical Exam:   VS:  BP 132/70   Pulse 80   Ht 5' 3 (1.6 m)   Wt 157 lb 6.4 oz (71.4 kg)   SpO2 98%   BMI 27.88 kg/m    Orhtostatics: No data found. Wt Readings from Last 3 Encounters:  04/29/24 157 lb 6.4 oz (71.4 kg)  04/15/24 157 lb 11.2 oz (71.5 kg)  04/02/24 159 lb (72.1 kg)    GEN: Well nourished, well developed in no acute distress NECK: No JVD; No carotid bruits CARDIAC:  RRR, no murmurs, rubs, gallops RESPIRATORY:  Clear to auscultation without rales, wheezing or rhonchi  ABDOMEN: Soft, non-tender, non-distended EXTREMITIES:  No edema; No deformity  ASSESSMENT AND PLAN: .    Preop clearance for colonoscopy 05/21/24 by Dr. Kristie patient with history of non obstructive HOCM. No change in symptoms. METs>5. She is at reasonable risk to proceed with above procedure.   According to the Revised Cardiac Risk Index (RCRI), her Perioperative Risk of Major Cardiac Event is (%): 0.9  Her Functional Capacity in METs is: 5.38 according to the Duke Activity Status Index (DASI).   Hypertrophic Cardiomyopathy  - non obstructive  - peak gradient < 30 mm Hg at rest - per Dr. Santo he feels she  does not have an inducible gradient, she cannot do stress test and we cannot do amyl  nitrate - suspicion of Fabry's/Danon or other mimics of HCM: low - Gene variant: Not tested  - NYHA I - COPD- optimized with Dr. VEAR  Primary HTN- BP controlled now off norvasc  5 mg due to low BP's    SVT- repeat heart monitor 03/2023 NSR few episodes SVT longest 14 seconds, no changes made.   HLD - on pravastatin  40 mg PO Daily  -LDL 73 03/2024          Dispo: f/u in 1 yr. Dr. Delford  Signed, Olivia Pavy, PA-C

## 2024-04-29 ENCOUNTER — Encounter: Payer: Self-pay | Admitting: Physician Assistant

## 2024-04-29 ENCOUNTER — Ambulatory Visit: Attending: Physician Assistant | Admitting: Physician Assistant

## 2024-04-29 VITALS — BP 132/70 | HR 80 | Ht 63.0 in | Wt 157.4 lb

## 2024-04-29 DIAGNOSIS — I422 Other hypertrophic cardiomyopathy: Secondary | ICD-10-CM

## 2024-04-29 DIAGNOSIS — I10 Essential (primary) hypertension: Secondary | ICD-10-CM

## 2024-04-29 DIAGNOSIS — J449 Chronic obstructive pulmonary disease, unspecified: Secondary | ICD-10-CM

## 2024-04-29 DIAGNOSIS — Z01818 Encounter for other preprocedural examination: Secondary | ICD-10-CM | POA: Diagnosis not present

## 2024-04-29 DIAGNOSIS — I471 Supraventricular tachycardia, unspecified: Secondary | ICD-10-CM | POA: Diagnosis not present

## 2024-04-29 DIAGNOSIS — E785 Hyperlipidemia, unspecified: Secondary | ICD-10-CM

## 2024-04-29 NOTE — Patient Instructions (Signed)
 Medication Instructions:  Your physician recommends that you continue on your current medications as directed. Please refer to the Current Medication list given to you today.  *If you need a refill on your cardiac medications before your next appointment, please call your pharmacy*  Lab Work: NONE If you have labs (blood work) drawn today and your tests are completely normal, you will receive your results only by: MyChart Message (if you have MyChart) OR A paper copy in the mail If you have any lab test that is abnormal or we need to change your treatment, we will call you to review the results.  Testing/Procedures: NONE  Follow-Up: At [facility], you and your health needs are our priority.  As part of our continuing mission to provide you with exceptional heart care, our providers are all part of one team.  This team includes your primary Cardiologist (physician) and Advanced Practice Providers or APPs (Physician Assistants and Nurse Practitioners) who all work together to provide you with the care you need, when you need it.  Your next appointment:   1 year(s)  Provider:   Maude Emmer, MD   We recommend signing up for the patient portal called MyChart.  Sign up information is provided on this After Visit Summary.  MyChart is used to connect with patients for Virtual Visits (Telemedicine).  Patients are able to view lab/test results, encounter notes, upcoming appointments, etc.  Non-urgent messages can be sent to your provider as well.   To learn more about what you can do with MyChart, go to [email or web address].

## 2024-05-08 ENCOUNTER — Ambulatory Visit (INDEPENDENT_AMBULATORY_CARE_PROVIDER_SITE_OTHER): Admitting: Otolaryngology

## 2024-05-08 ENCOUNTER — Encounter (INDEPENDENT_AMBULATORY_CARE_PROVIDER_SITE_OTHER): Payer: Self-pay | Admitting: Otolaryngology

## 2024-05-08 VITALS — BP 122/80 | HR 96 | Ht 63.0 in | Wt 157.0 lb

## 2024-05-08 DIAGNOSIS — R0981 Nasal congestion: Secondary | ICD-10-CM | POA: Diagnosis not present

## 2024-05-08 DIAGNOSIS — J343 Hypertrophy of nasal turbinates: Secondary | ICD-10-CM

## 2024-05-08 DIAGNOSIS — D7219 Other eosinophilia: Secondary | ICD-10-CM

## 2024-05-08 DIAGNOSIS — J328 Other chronic sinusitis: Secondary | ICD-10-CM | POA: Diagnosis not present

## 2024-05-08 DIAGNOSIS — R519 Headache, unspecified: Secondary | ICD-10-CM

## 2024-05-08 MED ORDER — AZELASTINE HCL 0.1 % NA SOLN: NASAL | 11 refills | Status: AC

## 2024-05-08 MED ORDER — FLONASE SENSIMIST 27.5 MCG/SPRAY NA SUSP: 2.0000 | Freq: Two times a day (BID) | NASAL | 12 refills | Status: AC

## 2024-05-08 NOTE — Progress Notes (Signed)
 Dear Dr. Charlett, Here is my assessment for our mutual patient, Desiree [name]. Thank you for allowing me the opportunity to care for your patient. Please do not hesitate to contact me should you have any other questions. Sincerely, Dr. Eldora Blanch  Otolaryngology Clinic Note  HISTORY: Desiree [name] is a 73 y.o. female kindly referred by Dr. Charlett for evaluation of headaches  Initial visit (04/2024): She reports that she has episodes where she feels like her scalp is pulling together. Ongoing for a year (but then later changed it to around 2020). Happens once or twice per week. She reports that these episodes do not last very long (few seconds), and then go away. She reports that most of the time, the episodes occur when she wakes up. She feels like she black out - unsure how to explain it. Denies frank vertigo or associated otologic sx including fullness or tinnitus. Tylenol  helps. Nothing really makes it worse.   She denies any sinonasal symptoms - no discolored drainage (yellow/green), no facial pressure/pain, no vertex headaches. Some PND and some congestion. She denies frequent history of sinus infections (nothing lately) - she did get antibiotic for this in February but did not help. No recent steroids  She has seen neuro for insomnia, sleep study was done which did not show any OSA.  She is a somewhat difficult historian as she is not sure when her symptoms began or associated symptoms.   Denies typical AR symptoms.   AP/AC: no  Tobacco: former, quit  PMHx: RA, COPD, Bronchiectasis, CKD, DM, Cardiomyopathy, SVT, HLD, Raynauds, DDD, CKD, Eosinophilia  RADIOGRAPHIC EVALUATION AND INDEPENDENT REVIEW OF OTHER RECORDS:: Dr. Charlett (12/05/2023 and 04/15/2024): noted headaches and concern for sinusitis contributing to her headaches; gotten CT and levaquin  (Feb 2025); Dx: Sinusitis, headaches; Rx: ref to ENT CT Head 12/05/2023 independently interpreted with respect to sinuses: limited eval  given cannot visualize max sinuses completely but essentially pansinus opacification with likely air-fluid level sphenoid sinuses; right mastoid effusion but ME clear CT Head 01/14/2020 also independently interpreted: again essentially pansinus opacification but less so with small air fluid levels in sphenoid Sinus opacification slowly worsening compared to CT in 2012, 2015 CBC and CMP 04/02/2024: WBC 6.4, Eos 538; BUN/Cr 1.32/43 RAST 2018: neg, IgE elev 120  Past Medical History:  Diagnosis Date   Acute gastritis without bleeding 08/08/2018   Anemia    Anxiety    Asthma    Bipolar disorder (HCC)    CANDIDIASIS, ESOPHAGEAL 07/28/2009   Qualifier: Diagnosis of  By: Charlett MD, Apolinar POUR    Chronic kidney disease    CKD III   Complication of anesthesia    was told she stopped breathing for one of her finger surgeries   COPD (chronic obstructive pulmonary disease) (HCC)    Depression    Diabetes mellitus without complication (HCC)    no meds   Dyspnea    At rest,and with activity   FH: colonic polyps    Fractured elbow    right    GERD (gastroesophageal reflux disease)    Headache    migraines   HH (hiatus hernia)    History of carpal tunnel syndrome    History of chest pain    History of transfusion of packed red blood cells    Hyperlipidemia    Hypertension    Mitral valve prolapse    Neuroleptic-induced tardive dyskinesia    Osteoarthritis of more than one site    Pneumonia    Seasonal  allergies    Stroke [facility])    Supraventricular tachycardia [facility])    Past Surgical History:  Procedure Laterality Date   AMPUTATION Left 10/14/2018   Procedure: AMPUTATION LEFT LONG FINGER TIP;  Surgeon: Lorretta Dess, MD;  Location: [facility] OR;  Service: Plastics;  Laterality: Left;   ANTERIOR CERVICAL DECOMP/DISCECTOMY FUSION  09/27/2016   C5-6 anterior cervical discectomy and fusion, allograft and plate/notes 87/12/7980   ANTERIOR CERVICAL DECOMP/DISCECTOMY FUSION N/A 09/27/2016   Procedure:  C5-6 Anterior Cervical Discectomy and Fusion, Allograft and Plate;  Surgeon: Oneil JAYSON Herald, MD;  Location: [facility] OR;  Service: Orthopedics;  Laterality: N/A;   Back Fusion  2002   BIOPSY  07/19/2018   Procedure: BIOPSY;  Surgeon: Rollin Dover, MD;  Location: [facility] ENDOSCOPY;  Service: Endoscopy;;   BIOPSY  02/13/2019   Procedure: BIOPSY;  Surgeon: Rollin Dover, MD;  Location: [facility] ENDOSCOPY;  Service: Endoscopy;;   CARPAL TUNNEL RELEASE  yates   left   COLONOSCOPY N/A 01/05/2014   Procedure: COLONOSCOPY;  Surgeon: Renaye Sous, MD;  Location: [facility] ENDOSCOPY;  Service: Endoscopy;  Laterality: N/A;   ELBOW SURGERY     age 64   ENTEROSCOPY N/A 02/13/2019   Procedure: ENTEROSCOPY;  Surgeon: Rollin Dover, MD;  Location: [facility] ENDOSCOPY;  Service: Endoscopy;  Laterality: N/A;   ESOPHAGOGASTRODUODENOSCOPY (EGD) WITH PROPOFOL  N/A 07/19/2018   Procedure: ESOPHAGOGASTRODUODENOSCOPY (EGD) WITH PROPOFOL ;  Surgeon: Rollin Dover, MD;  Location: [facility] ENDOSCOPY;  Service: Endoscopy;  Laterality: N/A;   EXTERNAL EAR SURGERY Left    EYE SURGERY     removed white dots under eyelid   FINGER SURGERY Left    INJECTION KNEE Right 07/21/2022   Procedure: INTRA-ARTICULAR INJECTION UNDER ANESTHESIA;  Surgeon: Herald Oneil JAYSON, MD;  Location: [facility] OR;  Service: Orthopedics;  Laterality: Right;   Juvara osteomy     KNEE SURGERY     NOSE SURGERY     Rt. toe bunion     skin, shave biopsy  05/03/2016   Left occipital scalp, top of scalp   TONSILLECTOMY     TOTAL HIP ARTHROPLASTY Right 12/17/2020   TOTAL HIP ARTHROPLASTY Right 12/17/2020   Procedure: TOTAL HIP ARTHROPLASTY-DIRECT ANTERIOR;  Surgeon: Herald Oneil JAYSON, MD;  Location: [facility] OR;  Service: Orthopedics;  Laterality: Right;  needs RNFA   TOTAL KNEE ARTHROPLASTY Left 07/21/2022   Procedure: LEFT TOTAL KNEE ARTHROPLASTY;  Surgeon: Herald Oneil JAYSON, MD;  Location: [facility] OR;  Service: Orthopedics;  Laterality: Left;   UPPER EXTREMITY ANGIOGRAPHY Bilateral 10/11/2018   Procedure: UPPER  EXTREMITY ANGIOGRAPHY;  Surgeon: Harvey Carlin BRAVO, MD;  Location: [facility] INVASIVE CV LAB;  Service: Cardiovascular;  Laterality: Bilateral;   Family History  Problem Relation Age of Onset   Diabetes Mother    Hypertension Mother    Heart attack Mother    Heart attack Father    Heart disease Father    Throat cancer Brother    Diabetes type II Brother    Heart disease Brother    Lung cancer Brother    Lung cancer Paternal Uncle    Lung cancer Daughter    Breast cancer Cousin    Social History   Tobacco Use   Smoking status: Former    Current packs/day: 0.00    Average packs/day: 2.0 packs/day for 30.0 years (60.0 ttl pk-yrs)    Types: Cigarettes    Start date: 10/24/1971    Quit date: 10/23/2001    Years since quitting: 22.5    Passive exposure: Past  Smokeless tobacco: Never  Substance Use Topics   Alcohol  use: No   Allergies  Allergen Reactions   Prednisone  Shortness Of Breath, Itching, Nausea And Vomiting and Palpitations   Solu-Medrol  [Methylprednisolone ] Anaphylaxis   Amoxicillin  Hives and Rash    Has patient had a PCN reaction causing immediate rash, facial/tongue/throat swelling, SOB or lightheadedness with hypotension:Yes Has patient had a PCN reaction causing severe rash involving mucus membranes or skin necrosis: No Has patient had a PCN reaction that required hospitalization: No Has patient had a PCN reaction occurring within the last 10 years: No If all of the above answers are NO, then may proceed with Cephalosporin use.    Codeine  Hives, Itching and Rash    Tolerated oxycodone  and morphine  previously   Hydrocodone -Acetaminophen  Hives, Itching and Rash    Tolerated oxycodone  and morphine  previously   Penicillins Hives, Itching and Rash    ALLERGIC REACTION TO ORAL AMOXICILLIN  Has patient had a PCN reaction causing immediate rash, facial/tongue/throat swelling, SOB or lightheadedness with hypotension: Yes Has patient had a PCN reaction causing severe rash  involving mucus membranes or skin necrosis: No Has patient had a PCN reaction that required hospitalization: No Has patient had a PCN reaction occurring within the last 10 years: No If all of the above answers are NO, then may proceed with Cephalosporin use.   Clavulanic Acid    Tape Other (See Comments)    sore   Current Outpatient Medications  Medication Sig Dispense Refill   acetaminophen  (TYLENOL ) 500 MG tablet Take 500 mg by mouth every 6 (six) hours as needed for mild pain or moderate pain.     albuterol  (VENTOLIN  HFA) 108 (90 Base) MCG/ACT inhaler INHALE 2 PUFFS EVERY 6 HOURS AS NEEDED FOR WHEEZING OR SHORTNESS OF BREATH 3 each 1   Alcohol  Swabs (DROPSAFE ALCOHOL  PREP) 70 % PADS USE AS DIRECTED 300 each 0   azelastine  (ASTELIN ) 0.1 % nasal spray USE 2 SPRAYS IN EACH NOSTRIL TWICE DAILY AS DIRECTED 30 mL 11   Blood Glucose Monitoring Suppl (ACCU-CHEK GUIDE) w/Device KIT 1 kit by Does not apply route daily. 1 kit 0   budeson-glycopyrrolate -formoterol  (BREZTRI  AEROSPHERE) 160-9-4.8 MCG/ACT AERO inhaler Inhale 2 puffs into the lungs in the morning and at bedtime. 1 each 11   cetirizine (ZYRTEC) 10 MG tablet Take 10 mg by mouth at bedtime.      diclofenac  sodium (VOLTAREN ) 1 % GEL APPLY 2-4 GRAMS TO AFFECTED JOINTS UP TO FOUR TIMES DAILY 400 g 2   famotidine  (PEPCID ) 40 MG tablet Take 1 tablet (40 mg total) by mouth daily. 90 tablet 3   feeding supplement, ENSURE ENLIVE, (ENSURE ENLIVE) LIQD Take 237 mLs by mouth 3 (three) times daily between meals. 14 Bottle 0   fluticasone  (FLONASE ) 50 MCG/ACT nasal spray Place 1 spray into both nostrils in the morning and at bedtime. 16 g 11   glucose blood (ACCU-CHEK GUIDE) test strip Use as instructed 100 each 12   hydroxychloroquine  (PLAQUENIL ) 200 MG tablet TAKE 1 TABLET(200 MG) BY MOUTH DAILY 90 tablet 0   ipratropium-albuterol  (DUONEB) 0.5-2.5 (3) MG/3ML SOLN Take 3 mLs by nebulization every 4 (four) hours as needed. 360 mL 0    ipratropium-albuterol  (DUONEB) 0.5-2.5 (3) MG/3ML SOLN Take 3 mLs by nebulization every 4 (four) hours as needed. 360 mL 3   LINZESS  145 MCG CAPS capsule Take 145 mcg by mouth every morning.     montelukast  (SINGULAIR ) 10 MG tablet TAKE 1 TABLET AT BEDTIME 90 tablet  3   Multiple Vitamins-Minerals (CENTRUM SILVER  50+WOMEN) TABS Take 1 tablet by mouth daily.     neomycin-bacitracin-polymyxin (NEOSPORIN) [phone] ointment Apply 1 Application topically daily as needed (on fingers).     ondansetron  (ZOFRAN -ODT) 4 MG disintegrating tablet DISSOLVE 1 TABLET ON THE TONGUE EVERY 8 HOURS AS NEEDED FOR NAUSEA OR VOMITING 20 tablet 1   pravastatin  (PRAVACHOL ) 40 MG tablet Take 1 tablet (40 mg total) by mouth daily. 90 tablet 3   Respiratory Therapy Supplies (FLUTTER) DEVI 1 Device by Does not apply route as directed. 1 each 0   Accu-Chek Softclix Lancets lancets Use to test blood sugar daily. Dx:e11.9 (Patient not taking: Reported on 05/08/2024) 100 each 12   amLODipine  (NORVASC ) 5 MG tablet TAKE 1 TABLET EVERY MORNING (Patient not taking: Reported on 05/08/2024) 90 tablet 3   levofloxacin  (LEVAQUIN ) 250 MG tablet Take  2( 500 mg) po   first day and then 250 mg per daily for sinusitis gfr 35 (Patient not taking: Reported on 05/08/2024) 10 tablet 0   Current Facility-Administered Medications  Medication Dose Route Frequency Provider Last Rate Last Admin   diclofenac  Sodium (VOLTAREN ) 1 % topical gel 4 g  4 g Topical QID Barbarann Oneil BROCKS, MD       BP 122/80 (BP Location: Left Arm, Patient Position: Sitting, Cuff Size: Normal)   Pulse 96   Ht 5' 3 (1.6 m)   Wt 157 lb (71.2 kg)   SpO2 95%   BMI 27.81 kg/m   PHYSICAL EXAM:  BP 122/80 (BP Location: Left Arm, Patient Position: Sitting, Cuff Size: Normal)   Pulse 96   Ht 5' 3 (1.6 m)   Wt 157 lb (71.2 kg)   SpO2 95%   BMI 27.81 kg/m    Salient findings:  CN II-XII intact Bilateral EAC clear and TM intact with well pneumatized middle ear spaces Nose:  Anterior rhinoscopy reveals septum relatively midline, bilateral inferior turbinate hypertrophy; modest mucosal edema;  Nasal endoscopy was indicated to better evaluate the nose and paranasal sinuses, given the patient's history and exam findings, and is detailed below. No lesions of oral cavity/oropharynx No obviously palpable neck masses No respiratory distress or audible stridor   PROCEDURE:  Prior to initiating any procedures, risks/benefits/alternatives were explained to the patient and verbal consent obtained. Diagnostic Nasal Endoscopy Pre-procedure diagnosis: Concern for chronic sinusitis Post-procedure diagnosis: same Indication: See pre-procedure diagnosis and physical exam above Complications: None apparent EBL: 0 mL Anesthesia: Lidocaine  4% and topical decongestant was topically sprayed in each nasal cavity  Description of Procedure:  Patient was identified. A rigid 30 degree endoscope was utilized to evaluate the sinonasal cavities, mucosa, sinus ostia and turbinates and septum.  Overall, signs of mucosal inflammation are noted - modest global mucosal edema.  No mucopurulence, polyps, or masses noted.   Right Middle meatus: no purulence Right SE Recess: no purulence Left MM: no purulence Left SE Recess: clear/no purulence  CPT CODE -- 68768 - Mod 25   ASSESSMENT:  73 y.o. with:  1. Occipital headache   2. Other chronic sinusitis   3. Nasal congestion   4. Hypertrophy of both inferior nasal turbinates   5. Other eosinophilia    Would not expect occipital headache/tightness to be necessarily related to her sinus findings, as she has had chronic sinonasal findings for several years now. She also does not report any CRS sx. Endo shows some mucosal edema but no purulence, and as such do not feel strongly abx warranted especially  as abx did not help last time. Perhaps her CT findings IgE/type 2 pathway related. In addition, the related symptoms including chronology where her  symptoms only last a couple of minutes at most and only occur once a month does not appear to be consistent with sinonasal cause. Wonder if more related to neuro cause/headache cause here.   We discussed options and she opted to proceed with conservative course: - Daily sinus rinses - Flonase  BID and astelin  BID - d/w pt re: possible neuro re-eval but she deferred this - f/u in 6 months,sooner as needed; return precautions discussed   See below regarding exact medications prescribed this encounter including dosages and route: No orders of the defined types were placed in this encounter.    Thank you for allowing me the opportunity to care for your patient. Please do not hesitate to contact me should you have any other questions.  Sincerely, Eldora Blanch, MD Otolaryngologist (ENT), [facility] Phone: [phone] Fax: [phone]  I have personally spent 60 minutes involved in face-to-face and non-face-to-face activities for this patient on the day of the visit.  Professional time spent excludes any procedures performed but includes the following activities, in addition to those noted in the documentation: preparing to see the patient (review of outside documentation and results), performing a medically appropriate examination, extensive counseling, ordering medications (flonase , astelin ), documenting in the electronic health record, independently interpreting results (multiple CT).   05/08/2024, 3:01 PM

## 2024-05-08 NOTE — Patient Instructions (Signed)
 Use two sprays of flonase in each nostril twice per day  right after, use astelin spray two sprays each nostril twice per day  Andres Bangs Med Nasal Saline Rinse - use daily - start nasal saline rinses with NeilMed Bottle available over the counter    Nasal Saline Irrigation instructions: If you choose to make your own salt water  solution, You will need: Salt (kosher, canning, or pickling salt) Baking soda Nasal irrigation bottle (i.e. Andres Bangs Med Sinus Rinse) Measuring spoon ( teaspoon) Distilled / boiled water    Mix solution Mix 1 teaspoon of salt, 1/2 teaspoon of baking soda and 1 cup of water  into irrigation bottle ** May use saline packet instead of homemade recipe for this step if you prefer If medicine was prescribed to be mixed with solution, place this into bottle Examples 2 inches of 2% mupirocin ointment Budesonide solution Position your head: Lean over sink (about 45 degrees) Rotate head (about 45 degrees) so that one nostril is above the other Irrigate Insert tip of irrigation bottle into upper nostril so it forms a comfortable seal Irrigate while breathing through your mouth May remove the straw from the bottle in order to irrigate the entire solution (important if medicine was added) Exhale through nose when finished and blow nose as necessary  Repeat on opposite side with other 1/2 of solution (120 mL) or remake solution if all 240 mL was used on first side Wash irrigation bottle regularly, replace every 3 months

## 2024-05-09 ENCOUNTER — Ambulatory Visit: Payer: Self-pay

## 2024-05-09 NOTE — Telephone Encounter (Signed)
 FYI Only or Action Required?: FYI only for provider.  Patient is followed in Pulmonology for COPD, last seen on 09/11/2023 by Hunsucker, Donnice SAUNDERS, MD.  Called Nurse Triage reporting Shortness of Breath.  Symptoms began about a month ago.  Interventions attempted: Rescue inhaler and Maintenance inhaler.  Symptoms are: unchanged.  Triage Disposition: See PCP Within 2 Weeks  Patient/caregiver understands and will follow disposition?: Yes       Copied from CRM [id number]. Topic: Clinical - Red Word Triage >> May 09, 2024  7:33 AM Nathanel DEL wrote: Red Word that prompted transfer to Nurse Triage: pt calling to reschedule appt this am for wheezing/sob Reason for Disposition  [1] MILD longstanding difficulty breathing (e.g., minimal/no SOB at rest, SOB with walking, pulse < 100) AND [2] SAME as normal  Answer Assessment - Initial Assessment Questions E2C2 Pulmonary Triage - Initial Assessment Questions Chief Complaint (e.g., cough, sob, wheezing, fever, chills, sweat or additional symptoms) *Go to specific symptom protocol after initial questions. SOB, wheezing Reports seeing PCP recently that noted wheezing and instructed pt to F/U with [facility]  How long have symptoms been present? X 1 month  Have you tested for COVID or Flu? Note: If not, ask patient if a home test can be taken. If so, instruct patient to call back for positive results. No  MEDICINES:   Have you used any OTC meds to help with symptoms? No If yes, ask What medications? N/a  Have you used your inhalers/maintenance medication? Yes If yes, What medications? Breztri  - 2 puffs daily Albuterol  PRN - last used yesterday Triager reviewed/reinforced albuterol  usage and SIG.     If inhaler, ask How many puffs and how often? Note: Review instructions on medication in the chart. See above  OXYGEN : Do you wear supplemental oxygen ? No If yes, How many liters are you supposed to use? N/a  Do you  monitor your oxygen  levels? No If yes, What is your reading (oxygen  level) today? Reports ENT visit yesterday at 95  What is your usual oxygen  saturation reading?  (Note: Pulmonary O2 sats should be 90% or greater) unknown    1. RESPIRATORY STATUS: Describe your breathing? (e.g., wheezing, shortness of breath, unable to speak, severe coughing)      See above 2. ONSET: When did this breathing problem begin?      X 1 month 3. PATTERN Does the difficult breathing come and go, or has it been constant since it started?      Comes and goes 4. SEVERITY: How bad is your breathing? (e.g., mild, moderate, severe)      Denies - I haven't noticed a change Triager does appreciate mild audible wheezing during call. Pt is speaking in full sentences. Triager instructed albuterol  use after call.   5. RECURRENT SYMPTOM: Have you had difficulty breathing before? If Yes, ask: When was the last time? and What happened that time?      N/a 6. CARDIAC HISTORY: Do you have any history of heart disease? (e.g., heart attack, angina, bypass surgery, angioplasty)      denies 7. LUNG HISTORY: Do you have any history of lung disease?  (e.g., pulmonary embolus, asthma, emphysema)     COPD 8. CAUSE: What do you think is causing the breathing problem?      N/a 9. OTHER SYMPTOMS: Do you have any other symptoms? (e.g., chest pain, cough, dizziness, fever, runny nose)     Occasional clear productive cough, occasional runny nose 10. O2 SATURATION MONITOR:  Do  you use an oxygen  saturation monitor (pulse oximeter) at home? If Yes, ask: What is your reading (oxygen  level) today? What is your usual oxygen  saturation reading? (e.g., 95%)       See above 11. PREGNANCY: Is there any chance you are pregnant? When was your last menstrual period?       N/a 12. TRAVEL: Have you traveled out of the country in the last month? (e.g., travel history, exposures)       N/a    Pt initially  calling to reschedule appt. Triager was able to convince pt to keep existing appt on 05/21/24. Upon further investigation, pt wanted to reschedule after 8/3 for copay reasons that are unknown. Triager explained to pt that her respiratory status needed to be evaluated before then. Patient verbalized understanding and to call back with worsening symptoms.  Protocols used: Breathing Difficulty-A-AH

## 2024-05-21 ENCOUNTER — Encounter: Payer: Self-pay | Admitting: Pulmonary Disease

## 2024-05-21 ENCOUNTER — Other Ambulatory Visit: Payer: Self-pay | Admitting: Internal Medicine

## 2024-05-21 ENCOUNTER — Ambulatory Visit: Admitting: Pulmonary Disease

## 2024-05-21 ENCOUNTER — Other Ambulatory Visit: Payer: Self-pay | Admitting: Rheumatology

## 2024-05-21 VITALS — BP 120/82 | HR 90 | Temp 98.5°F | Ht 63.0 in | Wt 156.0 lb

## 2024-05-21 DIAGNOSIS — J479 Bronchiectasis, uncomplicated: Secondary | ICD-10-CM

## 2024-05-21 DIAGNOSIS — J4540 Moderate persistent asthma, uncomplicated: Secondary | ICD-10-CM | POA: Diagnosis not present

## 2024-05-21 MED ORDER — HYDROXYCHLOROQUINE SULFATE 200 MG PO TABS: 200.0000 mg | ORAL_TABLET | Freq: Every day | ORAL | 0 refills | Status: DC

## 2024-05-21 MED ORDER — ALBUTEROL SULFATE HFA 108 (90 BASE) MCG/ACT IN AERS: 2.0000 | INHALATION_SPRAY | Freq: Four times a day (QID) | RESPIRATORY_TRACT | 1 refills | Status: AC | PRN

## 2024-05-21 NOTE — Telephone Encounter (Signed)
 Copied from CRM [id number]. Topic: Clinical - Medication Refill >> May 21, 2024  2:32 PM Nathanel DEL wrote: Medication: albuterol  (VENTOLIN  HFA) 108 (90 Base) MCG/ACT inhaler  Has the patient contacted their pharmacy? No Pt was in earlier today and forgot to let the dr know she needs this refilled.  This pharm has never filled this Rxfor pt This is the patient's preferred pharmacy:  [facility] [id number] - [place], [place] - [address] [place] [address] Phone: [phone] Fax: [phone]  Is this the correct pharmacy for this prescription? Yes If no, delete pharmacy and type the correct one.   Has the prescription been filled recently? No  Is the patient out of the medication? Yes  Has the patient been seen for an appointment in the last year OR does the patient have an upcoming appointment? Yes  Can we respond through MyChart? No  Agent: Please be advised that Rx refills may take up to 3 business days. We ask that you follow-up with your pharmacy.

## 2024-05-21 NOTE — Telephone Encounter (Signed)
 Last Fill: 02/08/2024  Eye exam: 05/30/2023 WNL    Labs: 04/02/2024 CBC and CMP is stable. Creatinine remains elevated. Lipid panel is normal. Please forward results to you.   Next Visit: 09/03/2024  Last Visit: 04/02/2024  DX: Rheumatoid arthritis with rheumatoid factor of multiple sites without organ or systems involvement   Current Dose per office note 04/02/2024: Plaquenil  200 mg 1 tablet by mouth daily-reduced dose due to CKD   Okay to refill Plaquenil ?

## 2024-05-21 NOTE — Telephone Encounter (Deleted)
 Copied from CRM [id number]. Topic: Clinical - Medication Refill >> May 21, 2024  2:32 PM Nathanel DEL wrote: Medication: albuterol  (VENTOLIN  HFA) 108 (90 Base) MCG/ACT inhaler  Has the patient contacted their pharmacy? No Pt was in earlier today and forgot to let the dr know she needs this refilled.  This pharm has never filled this Rxfor pt This is the patient's preferred pharmacy:  [facility] [id number] - [place], [place] - [address] [place] [address] Phone: [phone] Fax: [phone]  Is this the correct pharmacy for this prescription? Yes If no, delete pharmacy and type the correct one.   Has the prescription been filled recently? No  Is the patient out of the medication? Yes  Has the patient been seen for an appointment in the last year OR does the patient have an upcoming appointment? Yes  Can we respond through MyChart? No  Agent: Please be advised that Rx refills may take up to 3 business days. We ask that you follow-up with your pharmacy.

## 2024-05-21 NOTE — Telephone Encounter (Signed)
 Patient contacted the office to request a medication refill.  1. Name of Medication: Hydroxychloroquine   2. How are you currently taking this medication (dosage and times per day)? 1 tablet daily   3. What pharmacy would you like for that to be sent to? [facility] at [address]

## 2024-05-21 NOTE — Patient Instructions (Signed)
 VISIT SUMMARY:  Today, you were seen for wheezing and respiratory symptoms. You have a history of asthma. Your wheezing started about a month ago, and you experience nocturnal wheezing or shortness of breath two to three times per week. You are currently using Breztri  and albuterol  inhalers, as well as Duoneb nebulizers. Additionally, you have rheumatoid arthritis and are taking Plaquenil , but you are almost out of this medication.  YOUR PLAN:  -ASTHMA: Asthma is a condition where your airways narrow and swell, making it difficult to breathe. Your asthma is well-managed with your current medications, and there have been no recent severe episodes requiring emergency care or steroids.  intermittent wheezing is well-controlled, but you will benefit from increasing your Breztri  inhaler dose to two puffs in the morning and two puffs in the evening. Please follow up with Doctor Hunsucker.   INSTRUCTIONS:  Please follow up with [facility] for management of asthma.

## 2024-05-21 NOTE — Progress Notes (Signed)
 Desiree [name]    [id number]    [birth year]  Primary Care Physician:[name], Apolinar POUR, MD  Referring Physician: Charlett Apolinar POUR, MD [address] [place],  [place] [address]  Chief complaint: Acute visit for wheezing  HPI: 73 y.o. who  has a past medical history of Acute gastritis without bleeding (08/08/2018), Anemia, Anxiety, Asthma, Bipolar disorder (HCC), CANDIDIASIS, ESOPHAGEAL (07/28/2009), Chronic kidney disease, Complication of anesthesia, COPD (chronic obstructive pulmonary disease) (HCC), Depression, Diabetes mellitus without complication (HCC), Dyspnea, FH: colonic polyps, Fractured elbow, GERD (gastroesophageal reflux disease), Headache, HH (hiatus hernia), History of carpal tunnel syndrome, History of chest pain, History of transfusion of packed red blood cells, Hyperlipidemia, Hypertension, Mitral valve prolapse, Neuroleptic-induced tardive dyskinesia, Osteoarthritis of more than one site, Pneumonia, Seasonal allergies, Stroke (HCC), and Supraventricular tachycardia (HCC).  Discussed the use of AI scribe software for clinical note transcription with the patient, who gave verbal consent to proceed.  History of Present Illness Desiree [name] is a 73 year old female with asthma who presents with wheezing. She was referred by her primary doctor for evaluation of wheezing.  She follows with Dr. Annella  Wheezing and respiratory symptoms - Wheezing first noted approximately one month ago, confirmed by another outpatient physician - No increased shortness of breath - No recent emergency room visits or hospitalizations - Nocturnal wheezing or shortness of breath occurs two to three times per week  Inhaler and nebulizer use - Uses Breztri  inhaler daily, one puff in the morning and one at night - Uses albuterol  rescue inhaler as needed, typically two to three times per week, including this morning - Uses Duoneb nebulizers  Rheumatoid arthritis and medication  adherence - History of rheumatoid arthritis - Takes Plaquenil    Outpatient Encounter Medications as of 05/21/2024  Medication Sig   Accu-Chek Softclix Lancets lancets Use to test blood sugar daily. Dx:e11.9   acetaminophen  (TYLENOL ) 500 MG tablet Take 500 mg by mouth every 6 (six) hours as needed for mild pain or moderate pain.   albuterol  (VENTOLIN  HFA) 108 (90 Base) MCG/ACT inhaler INHALE 2 PUFFS EVERY 6 HOURS AS NEEDED FOR WHEEZING OR SHORTNESS OF BREATH   Alcohol  Swabs (DROPSAFE ALCOHOL  PREP) 70 % PADS USE AS DIRECTED   azelastine  (ASTELIN ) 0.1 % nasal spray USE 2 SPRAYS IN EACH NOSTRIL TWICE DAILY AS DIRECTED   Blood Glucose Monitoring Suppl (ACCU-CHEK GUIDE) w/Device KIT 1 kit by Does not apply route daily.   budeson-glycopyrrolate -formoterol  (BREZTRI  AEROSPHERE) 160-9-4.8 MCG/ACT AERO inhaler Inhale 2 puffs into the lungs in the morning and at bedtime.   cetirizine (ZYRTEC) 10 MG tablet Take 10 mg by mouth at bedtime.    diclofenac  sodium (VOLTAREN ) 1 % GEL APPLY 2-4 GRAMS TO AFFECTED JOINTS UP TO FOUR TIMES DAILY   famotidine  (PEPCID ) 40 MG tablet Take 1 tablet (40 mg total) by mouth daily.   feeding supplement, ENSURE ENLIVE, (ENSURE ENLIVE) LIQD Take 237 mLs by mouth 3 (three) times daily between meals.   fluticasone  (FLONASE  SENSIMIST) 27.5 MCG/SPRAY nasal spray Place 2 sprays into the nose in the morning and at bedtime.   fluticasone  (FLONASE ) 50 MCG/ACT nasal spray Place 1 spray into both nostrils in the morning and at bedtime.   glucose blood (ACCU-CHEK GUIDE) test strip Use as instructed   hydroxychloroquine  (PLAQUENIL ) 200 MG tablet TAKE 1 TABLET(200 MG) BY MOUTH DAILY   ipratropium-albuterol  (DUONEB) 0.5-2.5 (3) MG/3ML SOLN Take 3 mLs by nebulization every 4 (four) hours as  needed.   LINZESS  145 MCG CAPS capsule Take 145 mcg by mouth every morning.   montelukast  (SINGULAIR ) 10 MG tablet TAKE 1 TABLET AT BEDTIME   Multiple Vitamins-Minerals (CENTRUM SILVER  50+WOMEN) TABS Take  1 tablet by mouth daily.   neomycin-bacitracin-polymyxin (NEOSPORIN) [phone] ointment Apply 1 Application topically daily as needed (on fingers).   ondansetron  (ZOFRAN -ODT) 4 MG disintegrating tablet DISSOLVE 1 TABLET ON THE TONGUE EVERY 8 HOURS AS NEEDED FOR NAUSEA OR VOMITING   pravastatin  (PRAVACHOL ) 40 MG tablet Take 1 tablet (40 mg total) by mouth daily.   Respiratory Therapy Supplies (FLUTTER) DEVI 1 Device by Does not apply route as directed.   amLODipine  (NORVASC ) 5 MG tablet TAKE 1 TABLET EVERY MORNING (Patient not taking: Reported on 05/21/2024)   ipratropium-albuterol  (DUONEB) 0.5-2.5 (3) MG/3ML SOLN Take 3 mLs by nebulization every 4 (four) hours as needed. (Patient not taking: Reported on 05/21/2024)   levofloxacin  (LEVAQUIN ) 250 MG tablet Take  2( 500 mg) po   first day and then 250 mg per daily for sinusitis gfr 35 (Patient not taking: Reported on 05/21/2024)   Facility-Administered Encounter Medications as of 05/21/2024  Medication   diclofenac  Sodium (VOLTAREN ) 1 % topical gel 4 g     Physical Exam: Blood pressure 120/82, pulse 90, temperature 98.5 F (36.9 C), temperature source Oral, height 5' 3 (1.6 m), weight 156 lb (70.8 kg), SpO2 93%. Gen:      No acute distress HEENT:  EOMI, sclera anicteric Neck:     No masses; no thyromegaly Lungs:    Clear to auscultation bilaterally; normal respiratory effort CV:         Regular rate and rhythm; no murmurs Abd:      + bowel sounds; soft, non-tender; no palpable masses, no distension Ext:    No edema; adequate peripheral perfusion Skin:      Warm and dry; no rash Neuro: alert and oriented x 3 Psych: normal mood and affect  Data Reviewed: Imaging: CTA 10/09/2018-bronchial thickening, mucous plugging, mild atelectasis. Chest x-ray 05/08/2023-mildly prominent chronic interstitial markings I had reviewed images personally.  PFTs: 10/24/2017 FVC 1.79 [76%], FEV1 1.36 [74%], F/F76, TLC 6.19 [128%], DLCO 13.30 [59%] Small airway  disease with air trapping, moderately reduced diffusion capacity  Labs: Assessment & Plan Asthma with intermittent wheezing Asthma with intermittent wheezing is well-controlled. No recent emergency room visits or hospitalizations. No acute exacerbation. Wheezing is intermittent and may benefit from increased inhaler use. - Increase Breztri  dose to two puffs in the morning and two puffs in the evening. - Follow up with Dr. Annella Lonna Coder MD [facility] 05/21/2024, 10:22 AM  CC: Charlett Apolinar POUR, MD

## 2024-06-13 ENCOUNTER — Other Ambulatory Visit: Payer: Self-pay | Admitting: Gastroenterology

## 2024-06-17 ENCOUNTER — Telehealth: Payer: Self-pay

## 2024-06-17 NOTE — Telephone Encounter (Signed)
 Copied from CRM [id number]. Topic: General - Other >> Jun 17, 2024  3:06 PM Macario HERO wrote: Reason for CRM: Patient called said that [facility] is needing her provider Dr. Charlett to update her records to state that she is her primary care provider. [phone]

## 2024-06-19 ENCOUNTER — Ambulatory Visit: Admitting: Family Medicine

## 2024-06-19 NOTE — Telephone Encounter (Signed)
 Contacted the provided number. Spoke to [place], Technical brewer. Mzq#[id number]  Update her that Dr. Charlett is pt's PCP. No further action is needed.

## 2024-07-10 NOTE — Progress Notes (Signed)
 Cardiology Office Note:  .   Date:  07/23/2024  ID:  Desiree [name], DOB 08/27/1951, MRN [id number] PCP: Charlett Apolinar POUR, MD  [facility] Providers Cardiologist:  Maude Emmer, MD    History of Present Illness: Desiree   NYASHA [name] is a 73 y.o. female  with a hx of HCM  peak resting gradient only 10 mmhg on TTE 01/11/22  Unable to walk for stress echo.   Zio patch performed with PVCs and SVT. MRI 12/11/21 severe asymmetric LVH septum 20 mm LGE in septum < 1% total myocardium. Hyperdynamic function EF 71%  Patient was here last 04/05/23 to see Dr. Santo. And zio ordered -few episodes SVT  Patient has chronic DOE from COPD and asthma relieved with inhalers. Amlodipine  stopped by PCP b/c of low BP. No significant chest pain. Says she's used to her heart beating the way it does but can't be more descriptive. Not very active but does her housework. She only has cousins in town rest of family passed away     ROS:    Studies Reviewed: Desiree         Prior CV Studies:   Monitor 03/2023 Patient had a minimum heart rate of 56 bpm, maximum heart rate of 203 bpm, and average heart rate of 87 bpm.   Predominant underlying rhythm was sinus rhythm.   Few episodes of asymptomatic SVT, longest 14 seconds. There is artifact and atrial activity during these episodes.   Isolated PACs were rare (<1.0%).   Isolated PVCs were rare (<1.0%).   No evidence of significant heart block.   Triggered and diary events associated with sinus, PACs, and PVCs.   No malignant arrhythmias or atrial fibrillation.  Echo 2023   IMPRESSIONS     1. Peak LVOT gradient 10 mmHg with valsalva. Left ventricular ejection  fraction, by estimation, is 65 to 70%. The left ventricle has normal  function. The left ventricle has no regional wall motion abnormalities.  There is severe asymmetric left  ventricular hypertrophy of the basal-septal segment. Findings consistent  with hypertrophic cardiomyopathy. No dynamic LVOT  obstruction wtih  valsalva. The patient was unable to exercise for the stress portion. Left  ventricular diastolic parameters are  consistent with Grade I diastolic dysfunction (impaired relaxation).   2. The mitral valve is abnormal. Trivial mitral valve regurgitation.   Comparison(s): No significant change from prior study. 05/31/20: LVEF  70-75%.  Cardiac MRI 12/11/21  IMPRESSION: 1. Severe asymmetric LV hypertrophy measuring up to 20mm in basal septum (10mm in posterior wall), consistent with hypertrophic cardiomyopathy   2. Small amount of patchy late gadolinium enhancement in septum, consistent with HCM. LGE accounts for <1% of total myocardial mass   3.  Small LV size with hyperdynamic systolic function (EF 71%)   4.  Small RV size with normal systolic function (EF 66%)    Risk Assessment/Calculations:             Physical Exam:   VS:  BP (!) 106/54   Pulse 86   Ht 5' 3 (1.6 m)   Wt 157 lb (71.2 kg)   SpO2 98%   BMI 27.81 kg/m    Orhtostatics: No data found. Wt Readings from Last 3 Encounters:  07/23/24 157 lb (71.2 kg)  05/21/24 156 lb (70.8 kg)  05/08/24 157 lb (71.2 kg)    GEN: Well nourished, well developed in no acute distress NECK: No JVD; No carotid bruits CARDIAC:  RRR, no murmurs, rubs,  gallops RESPIRATORY:  Clear to auscultation without rales, wheezing or rhonchi  ABDOMEN: Soft, non-tender, non-distended EXTREMITIES:  No edema; No deformity   ASSESSMENT AND PLAN: .     Hypertrophic Cardiomyopathy  - non obstructive  - peak gradient < 10mm Hg at rest - per Dr. Santo he feels she  does not have an inducible gradient, she cannot do stress test and we cannot do amyl nitrate - suspicion of Fabry's/Danon or other mimics of HCM: low - Gene variant: Not tested  - NYHA I  Primary HTN- - BP controlled now off norvasc  5 mg due to low BP's    SVT - repeat heart monitor 03/2023 NSR few episodes SVT longest 14 seconds, no changes made.    HLD - on pravastatin  40 mg PO Daily  -LDL 73 03/2024  Asthma/COPD - f/u Dr Theophilus and Hunsucker - continue inhalers - avoid beta blockers despite HOCM          Dispo: f/u in 1 yr.    Signed, Maude Emmer, MD

## 2024-07-23 ENCOUNTER — Encounter: Payer: Self-pay | Admitting: Cardiovascular Disease

## 2024-07-23 ENCOUNTER — Ambulatory Visit: Attending: Internal Medicine | Admitting: Cardiovascular Disease

## 2024-07-23 VITALS — BP 106/54 | HR 86 | Ht 63.0 in | Wt 157.0 lb

## 2024-07-23 DIAGNOSIS — I422 Other hypertrophic cardiomyopathy: Secondary | ICD-10-CM

## 2024-07-23 DIAGNOSIS — J449 Chronic obstructive pulmonary disease, unspecified: Secondary | ICD-10-CM

## 2024-07-23 DIAGNOSIS — I10 Essential (primary) hypertension: Secondary | ICD-10-CM | POA: Diagnosis not present

## 2024-07-23 NOTE — Patient Instructions (Signed)
 Medication Instructions:  Your physician recommends that you continue on your current medications as directed. Please refer to the Current Medication list given to you today.  *If you need a refill on your cardiac medications before your next appointment, please call your pharmacy*  Follow-Up: At [facility], you and your health needs are our priority.  As part of our continuing mission to provide you with exceptional heart care, our providers are all part of one team.  This team includes your primary Cardiologist (physician) and Advanced Practice Providers or APPs (Physician Assistants and Nurse Practitioners) who all work together to provide you with the care you need, when you need it.  Your next appointment:   1 year(s)  Provider:   Janelle Mediate, MD  We recommend signing up for the patient portal called "MyChart".  Sign up information is provided on this After Visit Summary.  MyChart is used to connect with patients for Virtual Visits (Telemedicine).  Patients are able to view lab/test results, encounter notes, upcoming appointments, etc.  Non-urgent messages can be sent to your provider as well.   To learn more about what you can do with MyChart, go to [email or web address].

## 2024-07-25 ENCOUNTER — Encounter (HOSPITAL_COMMUNITY): Payer: Self-pay | Admitting: Gastroenterology

## 2024-07-25 NOTE — Progress Notes (Signed)
 Pre op call Desiree [name]     PCP-[name] MD Cardiologist-Nishan MD Pulmonologist- Forrestine MD  Chest xray- 05/08/23 EKG-04/29/24 Echo-01/11/22 Cath-n/a Stress-2016 ICD/PM-n/a  GLP1-n/a Blood Thinner-n/a  History:HTN, Stroke, Asthma, COPD, CKD3, SVT, Tardive Dyskinesia, Cardiomyopathy, Mitral Valve Prolapse, DM. Patient last saw her cardiologist 10/1 and they said they were unable to do stress test due to her not being able to do the walking part, she did a ZIO monitor in June. I asked the patient about this she wasn't sure why they were doing the test but said she couldn't stand up on the device they needed her to walk on, recommended f/u 1 year. She also sees pulmonary, last visit was 05/21/24 she was having some wheezing at that appt but noted it was intermittent, is due to see them again 09/04/20 She currently states she isnt having any cardiac or breathing issues. I asked about getting around she said only uses cane if longer distance, is able to take care of home duties okay.  Anesthesia Review- Yes- okay to proceed

## 2024-07-25 NOTE — Progress Notes (Signed)
 Attempted to obtain medical history for pre op call via telephone, unable to reach at this time. HIPAA compliant voicemail message left requesting return call to pre surgical testing department.

## 2024-08-01 ENCOUNTER — Other Ambulatory Visit: Payer: Self-pay

## 2024-08-01 ENCOUNTER — Encounter (HOSPITAL_COMMUNITY): Admission: RE | Disposition: A | Payer: Self-pay | Source: Home / Self Care | Attending: Gastroenterology

## 2024-08-01 ENCOUNTER — Ambulatory Visit (HOSPITAL_COMMUNITY): Admitting: Anesthesiology

## 2024-08-01 ENCOUNTER — Ambulatory Visit (HOSPITAL_COMMUNITY)
Admission: RE | Admit: 2024-08-01 | Discharge: 2024-08-01 | Disposition: A | Discharge status: Home / Self Care | Attending: Gastroenterology | Admitting: Gastroenterology

## 2024-08-01 ENCOUNTER — Encounter (HOSPITAL_COMMUNITY): Payer: Self-pay | Admitting: Gastroenterology

## 2024-08-01 DIAGNOSIS — I10 Essential (primary) hypertension: Secondary | ICD-10-CM

## 2024-08-01 DIAGNOSIS — J4489 Other specified chronic obstructive pulmonary disease: Secondary | ICD-10-CM | POA: Insufficient documentation

## 2024-08-01 DIAGNOSIS — D123 Benign neoplasm of transverse colon: Secondary | ICD-10-CM | POA: Insufficient documentation

## 2024-08-01 DIAGNOSIS — D124 Benign neoplasm of descending colon: Secondary | ICD-10-CM

## 2024-08-01 DIAGNOSIS — K219 Gastro-esophageal reflux disease without esophagitis: Secondary | ICD-10-CM | POA: Insufficient documentation

## 2024-08-01 DIAGNOSIS — I129 Hypertensive chronic kidney disease with stage 1 through stage 4 chronic kidney disease, or unspecified chronic kidney disease: Secondary | ICD-10-CM | POA: Insufficient documentation

## 2024-08-01 DIAGNOSIS — Z79899 Other long term (current) drug therapy: Secondary | ICD-10-CM | POA: Diagnosis not present

## 2024-08-01 DIAGNOSIS — Z87891 Personal history of nicotine dependence: Secondary | ICD-10-CM | POA: Insufficient documentation

## 2024-08-01 DIAGNOSIS — D128 Benign neoplasm of rectum: Secondary | ICD-10-CM | POA: Diagnosis not present

## 2024-08-01 DIAGNOSIS — Z1211 Encounter for screening for malignant neoplasm of colon: Secondary | ICD-10-CM | POA: Diagnosis present

## 2024-08-01 DIAGNOSIS — K5900 Constipation, unspecified: Secondary | ICD-10-CM | POA: Diagnosis not present

## 2024-08-01 DIAGNOSIS — E1122 Type 2 diabetes mellitus with diabetic chronic kidney disease: Secondary | ICD-10-CM | POA: Diagnosis not present

## 2024-08-01 DIAGNOSIS — E1151 Type 2 diabetes mellitus with diabetic peripheral angiopathy without gangrene: Secondary | ICD-10-CM | POA: Diagnosis not present

## 2024-08-01 DIAGNOSIS — D125 Benign neoplasm of sigmoid colon: Secondary | ICD-10-CM | POA: Diagnosis not present

## 2024-08-01 DIAGNOSIS — N183 Chronic kidney disease, stage 3 unspecified: Secondary | ICD-10-CM | POA: Diagnosis not present

## 2024-08-01 HISTORY — PX: POLYPECTOMY: SHX149

## 2024-08-01 HISTORY — PX: COLONOSCOPY: SHX5424

## 2024-08-01 LAB — GLUCOSE, CAPILLARY: Glucose-Capillary: 100 mg/dL — ABNORMAL HIGH (ref 70–99)

## 2024-08-01 SURGERY — COLONOSCOPY
Anesthesia: Monitor Anesthesia Care

## 2024-08-01 MED ORDER — PROPOFOL 500 MG/50ML IV EMUL
INTRAVENOUS | Status: DC | PRN
  Administered 2024-08-01: 100 ug/kg/min via INTRAVENOUS

## 2024-08-01 MED ORDER — SODIUM CHLORIDE 0.9 % IV SOLN: INTRAVENOUS | Status: DC

## 2024-08-01 MED ORDER — ALBUTEROL SULFATE HFA 108 (90 BASE) MCG/ACT IN AERS
INHALATION_SPRAY | RESPIRATORY_TRACT | Status: AC
  Filled 2024-08-01: qty 6.7

## 2024-08-01 MED ORDER — ALBUTEROL SULFATE HFA 108 (90 BASE) MCG/ACT IN AERS
INHALATION_SPRAY | RESPIRATORY_TRACT | Status: DC | PRN
  Administered 2024-08-01: 2 via RESPIRATORY_TRACT

## 2024-08-01 MED ORDER — ACETAMINOPHEN 10 MG/ML IV SOLN: 1000.0000 mg | Freq: Once | INTRAVENOUS | Status: DC | PRN

## 2024-08-01 MED ORDER — PROPOFOL 1000 MG/100ML IV EMUL
INTRAVENOUS | Status: AC
  Filled 2024-08-01: qty 100

## 2024-08-01 MED ORDER — FENTANYL CITRATE (PF) 50 MCG/ML IJ SOSY: 25.0000 ug | PREFILLED_SYRINGE | INTRAMUSCULAR | Status: DC | PRN

## 2024-08-01 MED ORDER — PHENYLEPHRINE HCL (PRESSORS) 10 MG/ML IV SOLN
INTRAVENOUS | Status: DC | PRN
  Administered 2024-08-01: 100 ug via INTRAVENOUS

## 2024-08-01 MED ORDER — OXYCODONE HCL 5 MG PO TABS: 5.0000 mg | ORAL_TABLET | Freq: Once | ORAL | Status: DC | PRN

## 2024-08-01 MED ORDER — OXYCODONE HCL 5 MG/5ML PO SOLN: 5.0000 mg | Freq: Once | ORAL | Status: DC | PRN

## 2024-08-01 MED ORDER — ONDANSETRON HCL 4 MG/2ML IJ SOLN: 4.0000 mg | Freq: Once | INTRAMUSCULAR | Status: DC | PRN

## 2024-08-01 MED ORDER — DROPERIDOL 2.5 MG/ML IJ SOLN: 0.6250 mg | Freq: Once | INTRAMUSCULAR | Status: DC | PRN

## 2024-08-01 NOTE — Transfer of Care (Signed)
 Immediate Anesthesia Transfer of Care Note  Patient: Desiree [name]  Procedure(s) Performed: COLONOSCOPY POLYPECTOMY, INTESTINE  Patient Location: PACU  Anesthesia Type:MAC  Level of Consciousness: awake and sedated  Airway & Oxygen  Therapy: Patient Spontanous Breathing and Patient connected to face mask oxygen   Post-op Assessment: Report given to RN and Post -op Vital signs reviewed and stable  Post vital signs: Reviewed and stable  Last Vitals:  Vitals Value Taken Time  BP 127/61 08/01/24 10:36  Temp    Pulse 83 08/01/24 10:37  Resp 23 08/01/24 10:38  SpO2 100 % 08/01/24 10:37  Vitals shown include unfiled device data.  Last Pain:  Vitals:   08/01/24 0900  TempSrc: Temporal  PainSc: 0-No pain         Complications: No notable events documented.

## 2024-08-01 NOTE — H&P (Signed)
 Ronal JAYSON Childes HPI: This 73 year old black female presents to the office for colorectal cancer screening. She takes Linzess  145 mcg as needed for constipation. She has diarrhea when she takes the Linzess . She can have 3-4 BM's per day when she takes the Linzess . At her baseline she has 1 BM per day. She has noticed bright red blood on the toilet tissue occasionally especially after she has had several loose bowel movements. She has occasional bouts of nausea but denies having any problems with vomiting. She takes Famotidine  40 mg for acid reflux. She has a good appetite and her weight has been stable. She denies having any complaints of abdominal pain, dysphagia or odynophagia. She denies having a family history of colon cancer, celiac sprue or IBD. She had a normal colonoscopy done on 01/05/2014.  Past Medical History:  Diagnosis Date   Acute gastritis without bleeding 08/08/2018   Anemia    Anxiety    Asthma    Bipolar disorder (HCC)    CANDIDIASIS, ESOPHAGEAL 07/28/2009   Qualifier: Diagnosis of  By: Charlett MD, Apolinar POUR    Chronic kidney disease    CKD III   Complication of anesthesia    was told she stopped breathing for one of her finger surgeries   COPD (chronic obstructive pulmonary disease) (HCC)    Depression    Diabetes mellitus without complication (HCC)    no meds   Dyspnea    At rest,and with activity   FH: colonic polyps    Fractured elbow    right    GERD (gastroesophageal reflux disease)    Headache    migraines   HH (hiatus hernia)    History of carpal tunnel syndrome    History of chest pain    History of transfusion of packed red blood cells    Hyperlipidemia    Hypertension    Mitral valve prolapse    Neuroleptic-induced tardive dyskinesia    Osteoarthritis of more than one site    Pneumonia    Seasonal allergies    Stroke [facility])    Supraventricular tachycardia     Past Surgical History:  Procedure Laterality Date   AMPUTATION Left 10/14/2018    Procedure: AMPUTATION LEFT LONG FINGER TIP;  Surgeon: Lorretta Dess, MD;  Location: [facility] OR;  Service: Plastics;  Laterality: Left;   ANTERIOR CERVICAL DECOMP/DISCECTOMY FUSION  09/27/2016   C5-6 anterior cervical discectomy and fusion, allograft and plate/notes 87/12/7980   ANTERIOR CERVICAL DECOMP/DISCECTOMY FUSION N/A 09/27/2016   Procedure: C5-6 Anterior Cervical Discectomy and Fusion, Allograft and Plate;  Surgeon: Oneil JAYSON Herald, MD;  Location: [facility] OR;  Service: Orthopedics;  Laterality: N/A;   Back Fusion  2002   BIOPSY  07/19/2018   Procedure: BIOPSY;  Surgeon: Rollin Dover, MD;  Location: [facility] ENDOSCOPY;  Service: Endoscopy;;   BIOPSY  02/13/2019   Procedure: BIOPSY;  Surgeon: Rollin Dover, MD;  Location: [facility] ENDOSCOPY;  Service: Endoscopy;;   CARPAL TUNNEL RELEASE  yates   left   COLONOSCOPY N/A 01/05/2014   Procedure: COLONOSCOPY;  Surgeon: Renaye Sous, MD;  Location: [facility] ENDOSCOPY;  Service: Endoscopy;  Laterality: N/A;   ELBOW SURGERY     age 2   ENTEROSCOPY N/A 02/13/2019   Procedure: ENTEROSCOPY;  Surgeon: Rollin Dover, MD;  Location: [facility] ENDOSCOPY;  Service: Endoscopy;  Laterality: N/A;   ESOPHAGOGASTRODUODENOSCOPY (EGD) WITH PROPOFOL  N/A 07/19/2018   Procedure: ESOPHAGOGASTRODUODENOSCOPY (EGD) WITH PROPOFOL ;  Surgeon: Rollin Dover, MD;  Location: [facility] ENDOSCOPY;  Service: Endoscopy;  Laterality: N/A;   EXTERNAL EAR SURGERY Left    EYE SURGERY     removed white dots under eyelid   FINGER SURGERY Left    INJECTION KNEE Right 07/21/2022   Procedure: INTRA-ARTICULAR INJECTION UNDER ANESTHESIA;  Surgeon: Barbarann Oneil BROCKS, MD;  Location: [facility] OR;  Service: Orthopedics;  Laterality: Right;   Juvara osteomy     KNEE SURGERY     NOSE SURGERY     Rt. toe bunion     skin, shave biopsy  05/03/2016   Left occipital scalp, top of scalp   TONSILLECTOMY     TOTAL HIP ARTHROPLASTY Right 12/17/2020   TOTAL HIP ARTHROPLASTY Right 12/17/2020   Procedure: TOTAL HIP ARTHROPLASTY-DIRECT ANTERIOR;  Surgeon:  Barbarann Oneil BROCKS, MD;  Location: [facility] OR;  Service: Orthopedics;  Laterality: Right;  needs RNFA   TOTAL KNEE ARTHROPLASTY Left 07/21/2022   Procedure: LEFT TOTAL KNEE ARTHROPLASTY;  Surgeon: Barbarann Oneil BROCKS, MD;  Location: [facility] OR;  Service: Orthopedics;  Laterality: Left;   UPPER EXTREMITY ANGIOGRAPHY Bilateral 10/11/2018   Procedure: UPPER EXTREMITY ANGIOGRAPHY;  Surgeon: Harvey Carlin BRAVO, MD;  Location: [facility] INVASIVE CV LAB;  Service: Cardiovascular;  Laterality: Bilateral;    Family History  Problem Relation Age of Onset   Diabetes Mother    Hypertension Mother    Heart attack Mother    Heart attack Father    Heart disease Father    Throat cancer Brother    Diabetes type II Brother    Heart disease Brother    Lung cancer Brother    Lung cancer Paternal Uncle    Lung cancer Daughter    Breast cancer Cousin     Social History:  reports that she quit smoking about 22 years ago. Her smoking use included cigarettes. She started smoking about 52 years ago. She has a 60 pack-year smoking history. She has been exposed to tobacco smoke. She has never used smokeless tobacco. She reports that she does not drink alcohol  and does not use drugs.  Allergies:  Allergies  Allergen Reactions   Prednisone  Shortness Of Breath, Itching, Nausea And Vomiting and Palpitations   Solu-Medrol  [Methylprednisolone ] Anaphylaxis   Amoxicillin  Hives and Rash    Has patient had a PCN reaction causing immediate rash, facial/tongue/throat swelling, SOB or lightheadedness with hypotension:Yes Has patient had a PCN reaction causing severe rash involving mucus membranes or skin necrosis: No Has patient had a PCN reaction that required hospitalization: No Has patient had a PCN reaction occurring within the last 10 years: No If all of the above answers are NO, then may proceed with Cephalosporin use.    Codeine  Hives, Itching and Rash    Tolerated oxycodone  and morphine  previously   Hydrocodone -Acetaminophen  Hives,  Itching and Rash    Tolerated oxycodone  and morphine  previously   Penicillins Hives, Itching and Rash    ALLERGIC REACTION TO ORAL AMOXICILLIN  Has patient had a PCN reaction causing immediate rash, facial/tongue/throat swelling, SOB or lightheadedness with hypotension: Yes Has patient had a PCN reaction causing severe rash involving mucus membranes or skin necrosis: No Has patient had a PCN reaction that required hospitalization: No Has patient had a PCN reaction occurring within the last 10 years: No If all of the above answers are NO, then may proceed with Cephalosporin use.   Clavulanic Acid    Tape Other (See Comments)    sore    Medications: Scheduled: Continuous:  sodium chloride  20 mL/hr at 08/01/24 0917   acetaminophen   Results for orders placed or performed during the hospital encounter of 08/01/24 (from the past 24 hours)  Glucose, capillary     Status: Abnormal   Collection Time: 08/01/24  9:13 AM  Result Value Ref Range   Glucose-Capillary 100 (H) 70 - 99 mg/dL   *Note: Due to a large number of results and/or encounters for the requested time period, some results have not been displayed. A complete set of results can be found in Results Review.     No results found.  ROS:  As stated above in the HPI otherwise negative.  Blood pressure (!) 162/89, pulse 83, temperature 97.6 F (36.4 C), temperature source Temporal, resp. rate 14, height 5' 3 (1.6 m), weight 71.2 kg, SpO2 98%.    PE: Gen: NAD, Alert and Oriented HEENT:  [place]/AT, EOMI Neck: Supple, no LAD Lungs: CTA Bilaterally CV: RRR without M/G/R ABD: Soft, NTND, +BS Ext: No C/C/E  Assessment/Plan: 1) Screening colonoscopy.  Kinda Pottle D 08/01/2024, 9:56 AM

## 2024-08-01 NOTE — Anesthesia Postprocedure Evaluation (Signed)
 Anesthesia Post Note  Patient: Desiree [name]  Procedure(s) Performed: COLONOSCOPY POLYPECTOMY, INTESTINE     Patient location during evaluation: Endoscopy Anesthesia Type: MAC Level of consciousness: awake Pain management: pain level controlled Vital Signs Assessment: post-procedure vital signs reviewed and stable Respiratory status: spontaneous breathing Cardiovascular status: blood pressure returned to baseline Postop Assessment: no apparent nausea or vomiting Anesthetic complications: no   No notable events documented.  Last Vitals:  Vitals:   08/01/24 1050 08/01/24 1100  BP: (!) 174/72 (!) 162/74  Pulse: 94   Resp: 16 20  Temp:    SpO2: 94% 93%    Last Pain:  Vitals:   08/01/24 1100  TempSrc:   PainSc: 0-No pain                 Lauraine KATHEE [place]

## 2024-08-01 NOTE — Discharge Instructions (Signed)
 YOU HAD AN ENDOSCOPIC PROCEDURE TODAY: Refer to the procedure report and other information in the discharge instructions given to you for any specific questions about what was found during the examination. If this information does not answer your questions, please call [facility] at [phone] to clarify.   YOU SHOULD EXPECT: Some feelings of bloating in the abdomen. Passage of more gas than usual. Walking can help get rid of the air that was put into your GI tract during the procedure and reduce the bloating. If you had a lower endoscopy (such as a colonoscopy or flexible sigmoidoscopy) you may notice spotting of blood in your stool or on the toilet paper. Some abdominal soreness may be present for a day or two, also.  DIET: Your first meal following the procedure should be a light meal and then it is ok to progress to your normal diet. A half-sandwich or bowl of soup is an example of a good first meal. Heavy or fried foods are harder to digest and may make you feel nauseous or bloated. Drink plenty of fluids but you should avoid alcoholic beverages for 24 hours. If you had an esophageal dilation, please see attached information for diet.   ACTIVITY: Your care partner should take you home directly after the procedure. You should plan to take it easy, moving slowly for the rest of the day. You can resume normal activity the day after the procedure however YOU SHOULD NOT DRIVE, use power tools, machinery or perform tasks that involve climbing or major physical exertion for 24 hours (because of the sedation medicines used during the test).   SYMPTOMS TO REPORT IMMEDIATELY: A gastroenterologist can be reached at any hour. Please call [phone]  for any of the following symptoms:  Following lower endoscopy (colonoscopy, flexible sigmoidoscopy) Excessive amounts of blood in the stool  Significant tenderness, worsening of abdominal pains  Swelling of the abdomen that is new, acute  Fever of  100 or higher   FOLLOW UP:  If any biopsies were taken you will be contacted by phone or by letter within the next 1-3 weeks. Call [phone]  if you have not heard about the biopsies in 3 weeks.  Please also call with any specific questions about appointments or follow up tests.

## 2024-08-01 NOTE — Op Note (Signed)
 [facility] Patient Name: Desiree [name] Procedure Date: 08/01/2024 MRN: [id number] Attending MD: Belvie Just , MD, [id number] Date of Birth: Jan 29, 1951 CSN: [id number] Age: 73 Admit Type: Outpatient Procedure:                Colonoscopy Indications:              Screening for colorectal malignant neoplasm Providers:                Belvie Just, MD, Gregoria Pierce, RN, Haskel Chris, Technician Referring MD:              Medicines:                 Complications:            No immediate complications. Estimated Blood Loss:     Estimated blood loss: none. Procedure:                Pre-Anesthesia Assessment:                           - Prior to the procedure, a History and Physical                            was performed, and patient medications and                            allergies were reviewed. The patient's tolerance of                            previous anesthesia was also reviewed. The risks                            and benefits of the procedure and the sedation                            options and risks were discussed with the patient.                            All questions were answered, and informed consent                            was obtained. Prior Anticoagulants: The patient has                            taken no anticoagulant or antiplatelet agents. ASA                            Grade Assessment: III - A patient with severe                            systemic disease. After reviewing the risks and                            benefits,  the patient was deemed in satisfactory                            condition to undergo the procedure.                           - Sedation was administered by an anesthesia                            professional. Deep sedation was attained.                           After obtaining informed consent, the colonoscope                            was passed under direct vision.  Throughout the                            procedure, the patient's blood pressure, pulse, and                            oxygen  saturations were monitored continuously. The                            CF-HQ190L ([id number]) Olympus colonoscope was                            introduced through the anus and advanced to the the                            cecum, identified by appendiceal orifice and                            ileocecal valve. The colonoscopy was performed                            without difficulty. The patient tolerated the                            procedure well. The quality of the bowel                            preparation was evaluated using the BBPS [facility]                            Bowel Preparation Scale) with scores of: Right                            Colon = 2 (minor amount of residual staining, small                            fragments of stool and/or opaque liquid, but mucosa  seen well), Transverse Colon = 2 (minor amount of                            residual staining, small fragments of stool and/or                            opaque liquid, but mucosa seen well) and Left Colon                            = 2 (minor amount of residual staining, small                            fragments of stool and/or opaque liquid, but mucosa                            seen well). The total BBPS score equals 6. The                            quality of the bowel preparation was good. The                            ileocecal valve, appendiceal orifice, and rectum                            were photographed. Scope In: 10:08:15 AM Scope Out: 10:28:41 AM Scope Withdrawal Time: 0 hours 11 minutes 43 seconds  Total Procedure Duration: 0 hours 20 minutes 26 seconds  Findings:      Six sessile polyps were found in the rectum, descending colon and       transverse colon. The polyps were 2 to 4 mm in size. These polyps were       removed with a cold snare.  Resection and retrieval were complete.      Scattered medium-mouthed and small-mouthed diverticula were found in the       entire colon. Impression:               - Six 2 to 4 mm polyps in the rectum, in the                            descending colon and in the transverse colon,                            removed with a cold snare. Resected and retrieved.                           - Diverticulosis in the entire examined colon. Moderate Sedation:      Not Applicable - Patient had care per Anesthesia. Recommendation:           - Patient has a contact number available for                            emergencies. The signs and symptoms of potential  delayed complications were discussed with the                            patient. Return to normal activities tomorrow.                            Written discharge instructions were provided to the                            patient.                           - Resume regular diet.                           - Continue present medications.                           - Await pathology results.                           - Repeat colonoscopy in 3 years for surveillance. Procedure Code(s):        --- Professional ---                           [phone], Colonoscopy, flexible; with removal of                            tumor(s), polyp(s), or other lesion(s) by snare                            technique Diagnosis Code(s):        --- Professional ---                           Z12.11, Encounter for screening for malignant                            neoplasm of colon                           D12.8, Benign neoplasm of rectum                           D12.4, Benign neoplasm of descending colon                           D12.3, Benign neoplasm of transverse colon (hepatic                            flexure or splenic flexure)                           K57.30, Diverticulosis of large intestine without                             perforation or abscess without bleeding CPT copyright 2022  American Medical Association. All rights reserved. The codes documented in this report are preliminary and upon coder review may  be revised to meet current compliance requirements. Belvie Just, MD Belvie Just, MD 08/01/2024 10:36:03 AM This report has been signed electronically. Number of Addenda: 0

## 2024-08-01 NOTE — Anesthesia Preprocedure Evaluation (Signed)
 Anesthesia Evaluation  Patient identified by MRN, date of birth, ID band Patient awake    Reviewed: Allergy  & Precautions, NPO status , Patient's Chart, lab work & pertinent test results  History of Anesthesia Complications Negative for: history of anesthetic complications  Airway Mallampati: II       Dental no notable dental hx. (+) Dental Advisory Given, Teeth Intact   Pulmonary asthma , COPD, neg recent URI, former smoker   breath sounds clear to auscultation       Cardiovascular hypertension, + Peripheral Vascular Disease  + dysrhythmias  Rhythm:Regular Rate:Normal   1. Peak LVOT gradient 10 mmHg with valsalva. Left ventricular ejection  fraction, by estimation, is 65 to 70%. The left ventricle has normal  function. The left ventricle has no regional wall motion abnormalities.  There is severe asymmetric left  ventricular hypertrophy of the basal-septal segment. Findings consistent  with hypertrophic cardiomyopathy. No dynamic LVOT obstruction wtih  valsalva. The patient was unable to exercise for the stress portion. Left  ventricular diastolic parameters are  consistent with Grade I diastolic dysfunction (impaired relaxation).   2. The mitral valve is abnormal. Trivial mitral valve regurgitation.     Neuro/Psych  Headaches    GI/Hepatic hiatal hernia,GERD  ,,  Endo/Other  diabetes    Renal/GU Renal disease     Musculoskeletal   Abdominal   Peds  Hematology  (+) Blood dyscrasia, anemia   Anesthesia Other Findings   Reproductive/Obstetrics                              Anesthesia Physical Anesthesia Plan  ASA: 2  Anesthesia Plan: MAC   Post-op Pain Management:    Induction: Intravenous  PONV Risk Score and Plan: 2 and Propofol  infusion  Airway Management Planned:   Additional Equipment:   Intra-op Plan:   Post-operative Plan:   Informed Consent:      Dental  advisory given  Plan Discussed with: CRNA and Surgeon  Anesthesia Plan Comments: ( 1. Peak LVOT gradient 10 mmHg with valsalva. Left ventricular ejection  fraction, by estimation, is 65 to 70%. The left ventricle has normal  function. The left ventricle has no regional wall motion abnormalities.  There is severe asymmetric left ventricular hypertrophy of the basal-septal segment. Findings consistent  with hypertrophic cardiomyopathy. No dynamic LVOT obstruction wtih  valsalva. The patient was unable to exercise for the stress portion. Left  ventricular diastolic parameters are  consistent with Grade I diastolic dysfunction (impaired relaxation).   2. The mitral valve is abnormal. Trivial mitral valve regurgitation.  )         Anesthesia Quick Evaluation

## 2024-08-03 ENCOUNTER — Encounter (HOSPITAL_COMMUNITY): Payer: Self-pay | Admitting: Gastroenterology

## 2024-08-04 LAB — SURGICAL PATHOLOGY
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
## 2024-08-28 ENCOUNTER — Ambulatory Visit: Admitting: Podiatry

## 2024-08-28 ENCOUNTER — Ambulatory Visit

## 2024-08-28 VITALS — Ht 63.0 in | Wt 159.7 lb

## 2024-08-28 DIAGNOSIS — E1151 Type 2 diabetes mellitus with diabetic peripheral angiopathy without gangrene: Secondary | ICD-10-CM | POA: Diagnosis not present

## 2024-08-28 DIAGNOSIS — M2041 Other hammer toe(s) (acquired), right foot: Secondary | ICD-10-CM

## 2024-08-28 DIAGNOSIS — L84 Corns and callosities: Secondary | ICD-10-CM | POA: Diagnosis not present

## 2024-08-28 DIAGNOSIS — M21611 Bunion of right foot: Secondary | ICD-10-CM

## 2024-08-28 DIAGNOSIS — M2011 Hallux valgus (acquired), right foot: Secondary | ICD-10-CM | POA: Diagnosis not present

## 2024-08-31 NOTE — Progress Notes (Signed)
  Subjective:  [name], female    DOB: Oct 02, 1951,  MRN: [id number]  Chief Complaint  Patient presents with   Diabetes    RM 10 DFC . Callus on the right second toe.    73 y.o. female presents with the above complaint. History confirmed with patient.  Calluses have returned, prior debridement was helpful.  She states her blood sugar has been well-controlled.  The right great toe and second toe continues to be very painful and limiting for her.  Objective:  Physical Exam: warm, good capillary refill, no trophic changes or ulcerative lesions, weakly palpable DP and PT pulses, will hammertoe right second toe there are multiple hyperkeratotic painful lesions as follows: Right fifth toe, second medial toe, submetatarsal 5 and submetatarsal 2: Left submetatarsal 1 and submetatarsal 5.  Significant hallux valgus deformity with hammertoe contracture large corn medial second toe and lateral hallux  Prior closing base wedge osteotomy with screw fixation, recurrent hallux valgus with arthritic changes digital deformities and elongated second ray  Assessment:   1. Callus   2. Hammer toe of right foot   3. 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 Diabetes mellitus type 2 with peripheral artery disease (HCC)       Plan:  Patient was evaluated and treated and all questions answered.     All symptomatic hyperkeratoses were safely debrided with a sterile # 312 blade to patient's level of comfort without incident.  Continue using supportive shoes and urea cream as tolerated   Her digital deformities can to be quite painful for her limiting her in shoe gear and ability to ambulate and function correctly.  Debridement has only been intermittently helpful and pain usually returns quite quickly for her.  She is interested in pursuing surgical correction.  We took x-rays today and reviewed them together and discussed the recurrence of the hallux valgus forming as well as presence of  the arthritis.  We discussed correction with MTP fusion, second shortening Weil osteotomy and hammertoe correction, may need flexor tendon transfer as well.  May consider screw removal as well.  All questions addressed.  We discussed the risk benefits and potential applications include but not limited to pain, swelling, infection, scar, numbness which may be temporary or permanent, chronic pain, stiffness, nerve pain or damage, wound healing problems, bone healing problems including delayed or non-union.  She understands wishes to proceed.  New ABIs have been ordered to complete prior to surgery.   Surgical plan:  Procedure: - Right first MTP fusion, second shortening metatarsal osteotomy, hammertoe correction second toe and flexor tendon transfer,   Location: - [facility]  Anesthesia plan: - Sedation with Exparel   Postoperative pain plan: - Tylenol  1000 mg every 6 hours,  gabapentin 300 mg every 8 hours x5 days, oxycodone  5 mg 1-2 tabs every 6 hours only as needed  DVT prophylaxis: - ASA 81 mg twice daily  WB Restrictions / DME needs: - Weightbearing as tolerated to heel in CAM boot postop   No follow-ups on file.

## 2024-09-02 ENCOUNTER — Telehealth: Payer: Self-pay

## 2024-09-02 NOTE — Progress Notes (Signed)
   09/02/2024  [name], female   DOB: Apr 04, 1951, 73 y.o.   MRN: [id number]  Pharmacy Quality Measure Review  This patient is appearing on a report for being at risk of failing the adherence measure for cholesterol (statin) medications this calendar year.   Medication: Pravastatin  40mg  Last fill date: 04/06/24 for 90 day supply  Contacted pharmacy to facilitate refills. At patient request, she is aware and plans to pick up.  Jon VEAR Lindau, PharmD Clinical Pharmacist [phone]

## 2024-09-03 ENCOUNTER — Ambulatory Visit: Attending: Physician Assistant | Admitting: Physician Assistant

## 2024-09-03 ENCOUNTER — Ambulatory Visit: Admitting: Pulmonary Disease

## 2024-09-03 ENCOUNTER — Encounter: Payer: Self-pay | Admitting: Physician Assistant

## 2024-09-03 VITALS — BP 129/77 | HR 92 | Temp 98.1°F | Resp 16 | Ht 62.0 in | Wt 162.0 lb

## 2024-09-03 DIAGNOSIS — Z87891 Personal history of nicotine dependence: Secondary | ICD-10-CM

## 2024-09-03 DIAGNOSIS — M51369 Other intervertebral disc degeneration, lumbar region without mention of lumbar back pain or lower extremity pain: Secondary | ICD-10-CM

## 2024-09-03 DIAGNOSIS — M19071 Primary osteoarthritis, right ankle and foot: Secondary | ICD-10-CM

## 2024-09-03 DIAGNOSIS — M0579 Rheumatoid arthritis with rheumatoid factor of multiple sites without organ or systems involvement: Secondary | ICD-10-CM

## 2024-09-03 DIAGNOSIS — M5030 Other cervical disc degeneration, unspecified cervical region: Secondary | ICD-10-CM

## 2024-09-03 DIAGNOSIS — M1711 Unilateral primary osteoarthritis, right knee: Secondary | ICD-10-CM

## 2024-09-03 DIAGNOSIS — Z96652 Presence of left artificial knee joint: Secondary | ICD-10-CM

## 2024-09-03 DIAGNOSIS — E782 Mixed hyperlipidemia: Secondary | ICD-10-CM

## 2024-09-03 DIAGNOSIS — Z79899 Other long term (current) drug therapy: Secondary | ICD-10-CM

## 2024-09-03 DIAGNOSIS — Z8659 Personal history of other mental and behavioral disorders: Secondary | ICD-10-CM

## 2024-09-03 DIAGNOSIS — M19072 Primary osteoarthritis, left ankle and foot: Secondary | ICD-10-CM

## 2024-09-03 DIAGNOSIS — I7301 Raynaud's syndrome with gangrene: Secondary | ICD-10-CM | POA: Diagnosis not present

## 2024-09-03 DIAGNOSIS — I471 Supraventricular tachycardia, unspecified: Secondary | ICD-10-CM

## 2024-09-03 DIAGNOSIS — N1832 Chronic kidney disease, stage 3b: Secondary | ICD-10-CM

## 2024-09-03 DIAGNOSIS — M19041 Primary osteoarthritis, right hand: Secondary | ICD-10-CM | POA: Diagnosis not present

## 2024-09-03 DIAGNOSIS — I10 Essential (primary) hypertension: Secondary | ICD-10-CM

## 2024-09-03 DIAGNOSIS — J479 Bronchiectasis, uncomplicated: Secondary | ICD-10-CM

## 2024-09-03 DIAGNOSIS — Z96641 Presence of right artificial hip joint: Secondary | ICD-10-CM

## 2024-09-03 DIAGNOSIS — M19042 Primary osteoarthritis, left hand: Secondary | ICD-10-CM

## 2024-09-03 DIAGNOSIS — Z8639 Personal history of other endocrine, nutritional and metabolic disease: Secondary | ICD-10-CM

## 2024-09-03 LAB — CBC WITH DIFFERENTIAL/PLATELET
Absolute Lymphocytes: 2994 {cells}/uL (ref 850–3900)
Absolute Monocytes: 714 {cells}/uL (ref 200–950)
Basophils Absolute: 91 {cells}/uL (ref 0–200)
Basophils Relative: 1.2 %
Eosinophils Absolute: 502 {cells}/uL — ABNORMAL HIGH (ref 15–500)
Eosinophils Relative: 6.6 %
HCT: 39.4 % (ref 35.0–45.0)
Hemoglobin: 13.1 g/dL (ref 11.7–15.5)
MCH: 29.7 pg (ref 27.0–33.0)
MCHC: 33.2 g/dL (ref 32.0–36.0)
MCV: 89.3 fL (ref 80.0–100.0)
MPV: 10.3 fL (ref 7.5–12.5)
Monocytes Relative: 9.4 %
Neutro Abs: 3298 {cells}/uL (ref 1500–7800)
Neutrophils Relative %: 43.4 %
Platelets: 221 Thousand/uL (ref 140–400)
RBC: 4.41 Million/uL (ref 3.80–5.10)
RDW: 13.1 % (ref 11.0–15.0)
Total Lymphocyte: 39.4 %
WBC: 7.6 Thousand/uL (ref 3.8–10.8)

## 2024-09-03 LAB — COMPREHENSIVE METABOLIC PANEL WITH GFR
AG Ratio: 1.6 (calc) (ref 1.0–2.5)
ALT: 15 U/L (ref 6–29)
AST: 25 U/L (ref 10–35)
Albumin: 4.5 g/dL (ref 3.6–5.1)
Alkaline phosphatase (APISO): 61 U/L (ref 37–153)
BUN/Creatinine Ratio: 16 (calc) (ref 6–22)
BUN: 23 mg/dL (ref 7–25)
CO2: 29 mmol/L (ref 20–32)
Calcium: 10 mg/dL (ref 8.6–10.4)
Chloride: 105 mmol/L (ref 98–110)
Creat: 1.44 mg/dL — ABNORMAL HIGH (ref 0.60–1.00)
Globulin: 2.9 g/dL (ref 1.9–3.7)
Glucose, Bld: 101 mg/dL — ABNORMAL HIGH (ref 65–99)
Potassium: 4.4 mmol/L (ref 3.5–5.3)
Sodium: 141 mmol/L (ref 135–146)
Total Bilirubin: 0.3 mg/dL (ref 0.2–1.2)
Total Protein: 7.4 g/dL (ref 6.1–8.1)
eGFR: 39 mL/min/1.73m2 — ABNORMAL LOW (ref >=60)

## 2024-09-04 ENCOUNTER — Ambulatory Visit: Payer: Self-pay | Admitting: Physician Assistant

## 2024-09-04 ENCOUNTER — Encounter: Payer: Self-pay | Admitting: Pulmonary Disease

## 2024-09-04 ENCOUNTER — Ambulatory Visit (INDEPENDENT_AMBULATORY_CARE_PROVIDER_SITE_OTHER): Admitting: Pulmonary Disease

## 2024-09-04 VITALS — BP 130/64 | HR 89 | Ht 63.0 in | Wt 146.0 lb

## 2024-09-04 DIAGNOSIS — J479 Bronchiectasis, uncomplicated: Secondary | ICD-10-CM | POA: Diagnosis not present

## 2024-09-04 DIAGNOSIS — J4550 Severe persistent asthma, uncomplicated: Secondary | ICD-10-CM

## 2024-09-04 NOTE — Patient Instructions (Signed)
 Nice to see you again  Try using the vest twice a day for the next month  If the cough and wheeze is no better, sending a message I think we should consider the injectable medicine called Nucala to treat asthma that can cause mucus production and wheezing and shortness of breath.  Send me a message in 1 month so we can make a plan.  Return to clinic in 3 months or sooner as needed with Dr. Annella
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
## 2024-09-04 NOTE — Progress Notes (Signed)
 Absolute eosinophils remain elevated but stable. Rest of CBC WNL Creatinine is elevated-1.44 and GFR is low-44-avoid the use of NSAIDs.  Rest of CMP WNL  Continue reduced dose of plaquenil 
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
## 2024-09-05 ENCOUNTER — Telehealth: Payer: Self-pay | Admitting: Podiatry

## 2024-09-05 ENCOUNTER — Other Ambulatory Visit: Payer: Self-pay | Admitting: Rheumatology

## 2024-09-05 NOTE — Telephone Encounter (Signed)
 Last Fill: 05/21/2024  Eye exam: 05/30/2023 WNL  Contacted the patient eye doctor and they are sending over her exam from 03/2024   Labs: 09/03/2024 Absolute eosinophils remain elevated but stable. Rest of CBC WNL Creatinine is elevated-1.44 and GFR is low-44-avoid the use of NSAIDs.  Rest of CMP WNL  Continue reduced dose of plaquenil   Next Visit: 02/02/2025  Last Visit: 09/03/2024  DX: Rheumatoid arthritis with rheumatoid factor of multiple sites without organ or systems involvement   Current Dose per office note 09/03/2024: Plaquenil  200 mg 1 tablet by mouth daily-reduced dose due to CKD.   Okay to refill Plaquenil ?

## 2024-09-05 NOTE — Telephone Encounter (Signed)
 Attempted to call patient to schedule surgery. Unable to leave message as voicemail never picked up.

## 2024-09-08 ENCOUNTER — Ambulatory Visit (HOSPITAL_COMMUNITY)
Admission: RE | Admit: 2024-09-08 | Discharge: 2024-09-08 | Disposition: A | Discharge status: Home / Self Care | Source: Ambulatory Visit | Attending: Podiatry | Admitting: Podiatry

## 2024-09-08 DIAGNOSIS — E1151 Type 2 diabetes mellitus with diabetic peripheral angiopathy without gangrene: Secondary | ICD-10-CM | POA: Insufficient documentation

## 2024-09-08 LAB — VAS US ABI WITH/WO TBI
Left ABI: 1.17
Right ABI: 1.07

## 2024-10-07 ENCOUNTER — Encounter: Payer: Self-pay | Admitting: Internal Medicine

## 2024-10-07 ENCOUNTER — Telehealth: Payer: Self-pay

## 2024-10-07 ENCOUNTER — Ambulatory Visit: Admitting: Internal Medicine

## 2024-10-07 VITALS — BP 110/82 | HR 82 | Temp 99.2°F | Ht 63.0 in | Wt 162.0 lb

## 2024-10-07 DIAGNOSIS — E1169 Type 2 diabetes mellitus with other specified complication: Secondary | ICD-10-CM

## 2024-10-07 DIAGNOSIS — J449 Chronic obstructive pulmonary disease, unspecified: Secondary | ICD-10-CM

## 2024-10-07 DIAGNOSIS — M2041 Other hammer toe(s) (acquired), right foot: Secondary | ICD-10-CM | POA: Diagnosis not present

## 2024-10-07 DIAGNOSIS — N183 Chronic kidney disease, stage 3 unspecified: Secondary | ICD-10-CM | POA: Diagnosis not present

## 2024-10-07 DIAGNOSIS — I10 Essential (primary) hypertension: Secondary | ICD-10-CM | POA: Diagnosis not present

## 2024-10-07 DIAGNOSIS — Z79899 Other long term (current) drug therapy: Secondary | ICD-10-CM | POA: Diagnosis not present

## 2024-10-07 DIAGNOSIS — M069 Rheumatoid arthritis, unspecified: Secondary | ICD-10-CM | POA: Diagnosis not present

## 2024-10-07 LAB — POCT GLYCOSYLATED HEMOGLOBIN (HGB A1C): Hemoglobin A1C: 5.8 % — AB (ref 4.0–5.6)

## 2024-10-07 NOTE — Patient Instructions (Addendum)
 Good to see you  today.  A1c is 5.8  ok today  Get urine for protein lab .  Plan  6 mos appt with lab  at appt

## 2024-10-07 NOTE — Telephone Encounter (Signed)
 Plan to start Nucala.  Based on elevated eosinophils and poorly controlled asthma.  Referral to pharmacotherapy clinic placed.

## 2024-10-07 NOTE — Telephone Encounter (Signed)
 Copied from CRM #[id number]. Topic: Clinical - Medical Advice >> Oct 06, 2024  3:12 PM Rilla B wrote: Reason for CRM: Patient calling to state Dr Annella told her to call in regarding her asthma and her wheezing and let him know what she decides to do.  Patient does not want to make an appt but wanted to let Dr Annella know she is ready to move forward. Please call patient at [phone].  Please advise next steps
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
## 2024-10-08 ENCOUNTER — Ambulatory Visit: Payer: Self-pay | Admitting: Internal Medicine

## 2024-10-08 ENCOUNTER — Telehealth: Payer: Self-pay

## 2024-10-08 LAB — MICROALBUMIN / CREATININE URINE RATIO
Creatinine,U: 85.9 mg/dL
Microalb Creat Ratio: 17.7 mg/g (ref 0.0–30.0)
Microalb, Ur: 1.5 mg/dL (ref 0.7–1.9)

## 2024-10-08 NOTE — Telephone Encounter (Signed)
 Called and relayed message per hunsucker verbalized understanding.NFN

## 2024-10-08 NOTE — Telephone Encounter (Signed)
ATC x 1 

## 2024-10-08 NOTE — Telephone Encounter (Signed)
 Received referral for new start Nucala. Opening benefits investigation in this thread.

## 2024-10-08 NOTE — Telephone Encounter (Signed)
 Opened benefits investigation in separate tab. Updates to follow in separate thread.

## 2024-10-08 NOTE — Progress Notes (Signed)
 No excess protein in urine   stable  sharing with team

## 2024-10-09 ENCOUNTER — Telehealth: Payer: Self-pay | Admitting: Internal Medicine

## 2024-10-09 NOTE — Telephone Encounter (Signed)
 Documented to pt's immunization.

## 2024-10-09 NOTE — Telephone Encounter (Signed)
 Copied from CRM #[id number]. Topic: Clinical - Medical Advice >> Oct 09, 2024 12:02 PM Jayma L wrote: Reason for CRM: patient stated she had her flu shot at the [employer] on oct 3rd

## 2024-10-10 NOTE — Telephone Encounter (Signed)
 Submitted a Prior Authorization request to [employer] for NUCALA  via CoverMyMeds. Will update once we receive a response.  Key: [id number]

## 2024-10-13 ENCOUNTER — Other Ambulatory Visit (HOSPITAL_COMMUNITY): Payer: Self-pay

## 2024-10-13 NOTE — Telephone Encounter (Signed)
 Received notification from [employer] regarding a prior authorization for NUCALA . Authorization has been APPROVED from 10/10/2024 to 04/10/2025. Approval letter sent to scan center.  Per test claim, copay for 28 days supply is $0  Patient can fill through [facility]: [phone]   Authorization # [id number]  Sherry Pennant, PharmD, MPH, BCPS, CPP Clinical Pharmacist

## 2024-10-14 ENCOUNTER — Ambulatory Visit: Attending: Pulmonary Disease

## 2024-10-14 DIAGNOSIS — J449 Chronic obstructive pulmonary disease, unspecified: Secondary | ICD-10-CM

## 2024-10-14 MED ORDER — NUCALA 100 MG/ML ~~LOC~~ SOAJ
100.0000 mg | SUBCUTANEOUS | 3 refills | Status: AC
  Filled 2024-10-20 – 2024-11-11 (×2): qty 1, 28d supply, fill #0

## 2024-10-14 MED ORDER — NUCALA 100 MG/ML ~~LOC~~ SOAJ
100.0000 mg | SUBCUTANEOUS | 0 refills | Status: AC
  Filled 2024-10-20: qty 1, 28d supply, fill #0
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
## 2024-10-14 NOTE — Telephone Encounter (Signed)
 Counseling completed in pharmacotherapy visit note 10/14/24. Scheduled new start injection 10/29/24.

## 2024-10-17 ENCOUNTER — Encounter (INDEPENDENT_AMBULATORY_CARE_PROVIDER_SITE_OTHER): Payer: Self-pay

## 2024-10-20 ENCOUNTER — Other Ambulatory Visit: Payer: Self-pay

## 2024-10-20 NOTE — Progress Notes (Signed)
 Specialty Pharmacy Initial Fill Coordination Note  Desiree [name] is a 73 y.o. female contacted today regarding initial fill of specialty medication(s) Mepolizumab  (Nucala )   Patient requested Courier to Provider Office   Delivery date: 10/22/24   Verified address: [address], [place], [place] [address]   Medication will be filled on: 10/21/24   Patient is aware of $0 copayment.    **Please call pt for refills and mail welcome packet**

## 2024-10-21 ENCOUNTER — Other Ambulatory Visit: Payer: Self-pay

## 2024-10-28 ENCOUNTER — Other Ambulatory Visit: Payer: Self-pay

## 2024-10-29 ENCOUNTER — Ambulatory Visit

## 2024-10-29 DIAGNOSIS — J4550 Severe persistent asthma, uncomplicated: Secondary | ICD-10-CM

## 2024-10-29 DIAGNOSIS — Z7189 Other specified counseling: Secondary | ICD-10-CM

## 2024-10-29 DIAGNOSIS — J449 Chronic obstructive pulmonary disease, unspecified: Secondary | ICD-10-CM

## 2024-10-29 NOTE — Patient Instructions (Signed)
 Your next Nucala  dose is due on 11/26/24, 12/24/24, and every 28 days thereafter  This medication is stored in the refrigerator. Please note, you should remove the drug from the refrigerator AT LEAST 45 minutes prior to injection to ensure the injection comes down to room temperature before injecting the medication.   If needed, this medication can be stored at room temperature for no longer than 7 days.   If you miss a dose, you should take it as soon as you remember it.   As a reminder, some side effects include injection site reaction, headache, and backache. If you are struggling with side effects, please call our office.   It can take several weeks to see the potential benefit of the medication. Please continue your other medications.   CONTINUE Breztri  and your other medications as prescribed.  Your prescription will be shipped from [facility]: [phone].  Someone will call to schedule shipment and confirm address. They will mail your medication to your home.  You will need to be seen by your provider in 3 to 4 months to assess how Nucala  is working for you. You have a follow-up appointment scheduled with Dr. Annella on 12/09/24. Call our clinic if you need to make this appointment.  Stay up to date on all routine vaccines: influenza, pneumonia, COVID19, Shingles  How to manage an injection site reaction: Remember the 5 C's: COUNTER - leave on the counter at least 30 minutes but up to overnight to bring medication to room temperature. This may help prevent stinging COLD - place something cold (like an ice gel pack or cold water  bottle) on the injection site just before cleansing with alcohol . This may help reduce pain CLARITIN  - use Claritin  (generic name is loratadine ) for the first two weeks of treatment or the day of, the day before, and the day after injecting. This will help to minimize injection site reactions CORTISONE CREAM - apply if injection site is  irritated and itching CALL ME - if injection site reaction is bigger than the size of your fist, looks infected, blisters, or if you develop hives

## 2024-10-29 NOTE — Progress Notes (Signed)
 "  HPI Patient presents today to [facility] to see pharmacy team for Nucala  new start.  Past medical history includes bronchiectasis and severe persistent asthma. She is referred to pharmacy team for new start Nucala  by Dr. Annella. Last OV with Dr. Annella was 09/04/24. At that time, plan to resume vest therapy and re-assess changes in cough. If no improvement in cough with use of vest, plan to start Nucala . Patient messaged on 10/07/24 complaining of wheezing. Plan to start Nucala  was initiated at that time.  OBJECTIVE Allergies[1]  Outpatient Encounter Medications as of 10/29/2024  Medication Sig   Accu-Chek Softclix Lancets lancets Use to test blood sugar daily. Dx:e11.9   acetaminophen  (TYLENOL ) 500 MG tablet Take 500 mg by mouth every 6 (six) hours as needed for mild pain or moderate pain.   albuterol  (VENTOLIN  HFA) 108 (90 Base) MCG/ACT inhaler Inhale 2 puffs into the lungs every 6 (six) hours as needed for wheezing or shortness of breath.   Alcohol  Swabs (DROPSAFE ALCOHOL  PREP) 70 % PADS USE AS DIRECTED   amLODipine  (NORVASC ) 5 MG tablet TAKE 1 TABLET EVERY MORNING   azelastine  (ASTELIN ) 0.1 % nasal spray USE 2 SPRAYS IN EACH NOSTRIL TWICE DAILY AS DIRECTED   Blood Glucose Monitoring Suppl (ACCU-CHEK GUIDE) w/Device KIT 1 kit by Does not apply route daily.   budeson-glycopyrrolate -formoterol  (BREZTRI  AEROSPHERE) 160-9-4.8 MCG/ACT AERO inhaler Inhale 2 puffs into the lungs in the morning and at bedtime.   cetirizine (ZYRTEC) 10 MG tablet Take 10 mg by mouth at bedtime.    diclofenac  sodium (VOLTAREN ) 1 % GEL APPLY 2-4 GRAMS TO AFFECTED JOINTS UP TO FOUR TIMES DAILY   famotidine  (PEPCID ) 40 MG tablet Take 1 tablet (40 mg total) by mouth daily.   feeding supplement, ENSURE ENLIVE, (ENSURE ENLIVE) LIQD Take 237 mLs by mouth 3 (three) times daily between meals. (Patient taking differently: Take 237 mLs by mouth 2 (two) times daily between meals.)   fluticasone  (FLONASE  SENSIMIST)  27.5 MCG/SPRAY nasal spray Place 2 sprays into the nose in the morning and at bedtime.   fluticasone  (FLONASE ) 50 MCG/ACT nasal spray Place 1 spray into both nostrils in the morning and at bedtime.   glucose blood (ACCU-CHEK GUIDE) test strip Use as instructed   hydroxychloroquine  (PLAQUENIL ) 200 MG tablet TAKE 1 TABLET(200 MG) BY MOUTH DAILY   ipratropium-albuterol  (DUONEB) 0.5-2.5 (3) MG/3ML SOLN Take 3 mLs by nebulization every 4 (four) hours as needed.   ipratropium-albuterol  (DUONEB) 0.5-2.5 (3) MG/3ML SOLN Take 3 mLs by nebulization every 4 (four) hours as needed.   levofloxacin  (LEVAQUIN ) 250 MG tablet Take  2( 500 mg) po   first day and then 250 mg per daily for sinusitis gfr 35   LINZESS  145 MCG CAPS capsule Take 145 mcg by mouth every morning.   Mepolizumab  (NUCALA ) 100 MG/ML SOAJ Inject 1 mL (100 mg total) into the skin every 28 (twenty-eight) days. Courier to pulm: [address], [place] [address]. Appt on 10/29/24   Mepolizumab  (NUCALA ) 100 MG/ML SOAJ Inject 1 mL (100 mg total) into the skin every 28 (twenty-eight) days.   montelukast  (SINGULAIR ) 10 MG tablet TAKE 1 TABLET AT BEDTIME   Multiple Vitamins-Minerals (CENTRUM SILVER  50+WOMEN) TABS Take 1 tablet by mouth daily.   neomycin-bacitracin-polymyxin (NEOSPORIN) [phone] ointment Apply 1 Application topically daily as needed (on fingers).   ondansetron  (ZOFRAN -ODT) 4 MG disintegrating tablet DISSOLVE 1 TABLET ON THE TONGUE EVERY 8 HOURS AS NEEDED FOR NAUSEA OR VOMITING  pravastatin  (PRAVACHOL ) 40 MG tablet Take 1 tablet (40 mg total) by mouth daily.   Respiratory Therapy Supplies (FLUTTER) DEVI 1 Device by Does not apply route as directed.   Facility-Administered Encounter Medications as of 10/29/2024  Medication   diclofenac  Sodium (VOLTAREN ) 1 % topical gel 4 g     Immunization History  Administered Date(s) Administered    sv, Bivalent, Protein Subunit Rsvpref,pf (Abrysvo) 07/13/2022   Fluad Quad(high Dose  65+) 07/09/2019, 07/09/2020, 07/11/2021, 07/13/2022, 07/25/2024   H1N1 11/13/2008   INFLUENZA, HIGH DOSE SEASONAL PF 07/05/2017, 07/14/2018   Influenza Whole 07/21/2008, 07/28/2009, 06/28/2010, 07/05/2011, 09/22/2012   Influenza,inj,Quad PF,6+ Mos 07/11/2013, 07/03/2014, 06/23/2015, 06/29/2016   PFIZER Comirnaty(Gray Top)Covid-19 Tri-Sucrose Vaccine 02/02/2021   PFIZER(Purple Top)SARS-COV-2 Vaccination 11/17/2019, 12/08/2019, 08/04/2020, 08/08/2021, 08/19/2022   PNEUMOCOCCAL CONJUGATE-20 08/23/2022, 10/09/2023   Pneumococcal Conjugate-13 12/19/2013   Pneumococcal Polysaccharide-23 02/25/2009, 04/19/2015   Td 02/02/2010   Tdap 04/25/2016   Zoster Recombinant(Shingrix) 12/09/2021, 12/11/2021, 02/07/2022     PFTs    Latest Ref Rng & Units 10/24/2017    8:13 AM  PFT Results  FVC-Pre L 1.87   FVC-Predicted Pre % 79   FVC-Post L 1.79   FVC-Predicted Post % 76   Pre FEV1/FVC % % 72   Post FEV1/FCV % % 76   FEV1-Pre L 1.34   FEV1-Predicted Pre % 73   FEV1-Post L 1.36   DLCO uncorrected ml/min/mmHg 13.30   DLCO UNC% % 59   DLCO corrected ml/min/mmHg 13.39   DLCO COR %Predicted % 60   DLVA Predicted % 87   TLC L 6.19   TLC % Predicted % 128   RV % Predicted % 179      Eosinophils Most recent blood eosinophil count was 502 cells/microL taken on 09/03/24.   Assessment   Biologics training for mepolizumab  (Nucala )  Goals of therapy: Mechanism of Action: Not fully understood. It does act an interleukin-5 (IL-5) antagonist monoclonal antibody that reduces the production and survival of eosinophils by blocking the binding of IL-5 to the alpha chain of the receptor complex on the eosinophil cell surface. Reviewed that Nucala  is add-on medication and patient must continue maintenance inhaler regimen. Response to therapy: may take 3 months to 6 months to determine efficacy. Discussed that patients generally feel improvement sooner than 3 months.  Side effects: headache (19%), injection  site reaction (7-15%), antibody development (6%), backache (5%), fatigue (5%)  Dose: 100 mg subcutaneously every 4 weeks  Administration/Storage:  Reviewed administration sites of thigh or abdomen (at least 2-3 inches away from abdomen). Reviewed the upper arm is only appropriate if caregiver is administering injection  Do not shake the reconstituted solution as this could lead to product foaming or precipitation. Solution should be clear to opalescent and colorless to pale yellow or pale brown, essentially particle free. Small air bubbles, however, are expected and acceptable. If particulate matter remains in the solution or if the solution appears cloudy or milky, discard the solution.  Reviewed storage of medication in refrigerator. Reviewed that Nucala  can be stored at room temperature in unopened carton for up to 7 days.  Access: Approval of Nucala  through: insurance Patient enrolled into copay card program to help with copay assistance.  Patient self-administered Nucala  100mg /mL in right lower abdomen using [facility]-supplied medication  Nucala  100mg /mL autoinjector pen NDC: [phone] Lot: [id number] Expiration: 2028-MAY  Patient monitored for 30 minutes for adverse reaction.  Patient tolerated well. Patient denies itchiness and irritation at injection.  PLAN Continue Nucala  100mg  every  4 weeks. Next dose is due 11/26/24 and every 4 weeks thereafter. Rx sent to: [facility]: [phone] . Patient provided with pharmacy phone number. Continue maintenance asthma regimen. Nucala  does NOT change or replace your other medications.  All questions encouraged and answered.  Instructed patient to reach out with any further questions or concerns.  Thank you for allowing pharmacy to participate in this patient's care.  This appointment required 45 minutes of patient care (this includes precharting, chart review, review of results, face-to-face care, etc.).  Aleck Puls,  PharmD, BCPS, CPP Clinical Pharmacist  [facility]  [facility]     [1]  Allergies Allergen Reactions   Prednisone  Shortness Of Breath, Itching, Nausea And Vomiting and Palpitations   Solu-Medrol  [Methylprednisolone ] Anaphylaxis   Amoxicillin  Hives and Rash    Has patient had a PCN reaction causing immediate rash, facial/tongue/throat swelling, SOB or lightheadedness with hypotension:Yes Has patient had a PCN reaction causing severe rash involving mucus membranes or skin necrosis: No Has patient had a PCN reaction that required hospitalization: No Has patient had a PCN reaction occurring within the last 10 years: No If all of the above answers are NO, then may proceed with Cephalosporin use.    Codeine  Hives, Itching and Rash    Tolerated oxycodone  and morphine  previously   Hydrocodone -Acetaminophen  Hives, Itching and Rash    Tolerated oxycodone  and morphine  previously   Penicillins Hives, Itching and Rash    ALLERGIC REACTION TO ORAL AMOXICILLIN  Has patient had a PCN reaction causing immediate rash, facial/tongue/throat swelling, SOB or lightheadedness with hypotension: Yes Has patient had a PCN reaction causing severe rash involving mucus membranes or skin necrosis: No Has patient had a PCN reaction that required hospitalization: No Has patient had a PCN reaction occurring within the last 10 years: No If all of the above answers are NO, then may proceed with Cephalosporin use.   Clavulanic Acid    Tape Other (See Comments)    sore   "

## 2024-10-30 NOTE — Progress Notes (Signed)
 Please see note 10/14/24 - initial counseling for Nucala  followed by completed injection training on 10/29/24. Detailed education completed before first Nucala  injection on 10/29/24.  Patient self-administered Nucala  100mg /mL in right lower abdomen using [facility]-supplied medication   Patient monitored for 30 minutes for adverse reaction.  Patient tolerated well. Patient denies itchiness and irritation at injection.   PLAN Continue Nucala  100mg  every 4 weeks. Next dose is due 11/26/24 and every 4 weeks thereafter. Rx sent to: [facility]: [phone] . Patient provided with pharmacy phone number. Continue maintenance asthma regimen. Nucala  does NOT change or replace your other medications.   All questions encouraged and answered.  Instructed patient to reach out with any further questions or concerns.   Aleck Puls, PharmD, BCPS, CPP Clinical Pharmacist  [facility]  [facility]

## 2024-11-03 ENCOUNTER — Ambulatory Visit

## 2024-11-03 VITALS — Ht 63.0 in | Wt 153.0 lb

## 2024-11-03 DIAGNOSIS — Z0000 Encounter for general adult medical examination without abnormal findings: Secondary | ICD-10-CM | POA: Diagnosis not present

## 2024-11-03 NOTE — Patient Instructions (Addendum)
 Desiree [name],  Thank you for taking the time for your Medicare Wellness Visit. I appreciate your continued commitment to your health goals. Please review the care plan we discussed, and feel free to reach out if I can assist you further.  Please note that Annual Wellness Visits do not include a physical exam. Some assessments may be limited, especially if the visit was conducted virtually. If needed, we may recommend an in-person follow-up with your provider.  Ongoing Care Seeing your primary care provider every 3 to 6 months helps us  monitor your health and provide consistent, personalized care.   Referrals If a referral was made during today's visit and you haven't received any updates within two weeks, please contact the referred provider directly to check on the status.  Recommended Screenings:  Health Maintenance  Topic Date Due   Breast Cancer Screening  02/21/2022   Complete foot exam   04/02/2024   COVID-19 Vaccine (7 - 2025-26 season) 06/23/2024   Hemoglobin A1C  04/07/2025   Eye exam for diabetics  04/16/2025   Yearly kidney function blood test for diabetes  09/03/2025   Yearly kidney health urinalysis for diabetes  10/07/2025   Medicare Annual Wellness Visit  11/03/2025   DTaP/Tdap/Td vaccine (3 - Td or Tdap) 04/25/2026   Colon Cancer Screening  08/01/2034   Pneumococcal Vaccine for age over 51  Completed   Flu Shot  Completed   Osteoporosis screening with Bone Density Scan  Completed   Hepatitis C Screening  Completed   Zoster (Shingles) Vaccine  Completed   Meningitis B Vaccine  Aged Out       11/03/2024    3:38 PM  Advanced Directives  Does Patient Have a Medical Advance Directive? Yes  Type of Estate Agent of [place];Living will  Does patient want to make changes to medical advance directive? No - Patient declined  Copy of Healthcare Power of Attorney in Chart? Yes - validated most recent copy scanned in chart (See row information)     Vision: Annual vision screenings are recommended for early detection of glaucoma, cataracts, and diabetic retinopathy. These exams can also reveal signs of chronic conditions such as diabetes and high blood pressure.  Dental: Annual dental screenings help detect early signs of oral cancer, gum disease, and other conditions linked to overall health, including heart disease and diabetes.  Please see the attached documents for additional preventive care recommendations.

## 2024-11-03 NOTE — Progress Notes (Signed)
 "  Chief Complaint  Patient presents with   Medicare Wellness     Subjective:   Desiree [name] is a 74 y.o. female who presents for a Medicare Annual Wellness Visit.  Visit info / Clinical Intake: Medicare Wellness Visit Type:: Subsequent Annual Wellness Visit Persons participating in visit and providing information:: patient Medicare Wellness Visit Mode:: Telephone If telephone:: video declined Since this visit was completed virtually, some vitals may be partially provided or unavailable. Missing vitals are due to the limitations of the virtual format.: Documented vitals are patient reported If Telephone or Video please confirm:: I connected with patient using audio/video enable telemedicine. I verified patient identity with two identifiers, discussed telehealth limitations, and patient agreed to proceed. Patient Location:: Home Provider Location:: Office Interpreter Needed?: No Pre-visit prep was completed: yes AWV questionnaire completed by patient prior to visit?: no Living arrangements:: (!) lives alone Patient's Overall Health Status Rating: (!) poor Typical amount of pain: none Does pain affect daily life?: no Are you currently prescribed opioids?: no  Dietary Habits and Nutritional Risks How many meals a day?: 2 Eats fruit and vegetables daily?: yes Most meals are obtained by: preparing own meals In the last 2 weeks, have you had any of the following?: none Diabetic:: (!) yes Any non-healing wounds?: no How often do you check your BS?: as needed Would you like to be referred to a Nutritionist or for Diabetic Management? : no  Functional Status Activities of Daily Living (to include ambulation/medication): Independent Ambulation: Independent with device- listed below Home Assistive Devices/Equipment: Eyeglasses; Dentures (specify type); Walker (specify Type); Cane Medication Administration: Independent Home Management (perform basic housework or laundry):  Independent Manage your own finances?: yes Primary transportation is: driving Concerns about vision?: no *vision screening is required for WTM* Concerns about hearing?: no  Fall Screening Falls in the past year?: 1 Number of falls in past year: 0 Was there an injury with Fall?: 0 Fall Risk Category Calculator: 1 Patient Fall Risk Level: Low Fall Risk  Fall Risk Patient at Risk for Falls Due to: Impaired balance/gait Fall risk Follow up: Falls evaluation completed; Falls prevention discussed; Education provided  Home and Transportation Safety: All rugs have non-skid backing?: N/A, no rugs All stairs or steps have railings?: N/A, no stairs Grab bars in the bathtub or shower?: yes Have non-skid surface in bathtub or shower?: yes Good home lighting?: yes Regular seat belt use?: yes Hospital stays in the last year:: no  Cognitive Assessment Difficulty concentrating, remembering, or making decisions? : no Will 6CIT or Mini Cog be Completed: yes What year is it?: 0 points What month is it?: 0 points Give patient an address phrase to remember (5 components): [address] [place]  About what time is it?: 0 points Count backwards from 20 to 1: 0 points Say the months of the year in reverse: 0 points Repeat the address phrase from earlier: 0 points 6 CIT Score: 0 points  Advance Directives (For Healthcare) Does Patient Have a Medical Advance Directive?: Yes Does patient want to make changes to medical advance directive?: No - Patient declined Type of Advance Directive: Healthcare Power of [place]; Living will Copy of Healthcare Power of Attorney in Chart?: Yes - validated most recent copy scanned in chart (See row information) Copy of Living Will in Chart?: Yes - validated most recent copy scanned in chart (See row information) Would patient like information on creating a medical advance directive?: No - Patient declined  Reviewed/Updated  Reviewed/Updated: Reviewed  All  (Medical, Surgical, Family, Medications, Allergies, Care Teams, Patient Goals)    Allergies (verified) Prednisone , Solu-medrol  [methylprednisolone ], Amoxicillin , Codeine , Hydrocodone -acetaminophen , Penicillins, Clavulanic acid, and Tape   Current Medications (verified) Outpatient Encounter Medications as of 11/03/2024  Medication Sig   Mepolizumab  (NUCALA ) 100 MG/ML SOAJ Inject 1 mL (100 mg total) into the skin every 28 (twenty-eight) days. Courier to pulm: [address], [address], [place] [address]. Appt on 10/29/24   ondansetron  (ZOFRAN -ODT) 4 MG disintegrating tablet DISSOLVE 1 TABLET ON THE TONGUE EVERY 8 HOURS AS NEEDED FOR NAUSEA OR VOMITING   Accu-Chek Softclix Lancets lancets Use to test blood sugar daily. Dx:e11.9   acetaminophen  (TYLENOL ) 500 MG tablet Take 500 mg by mouth every 6 (six) hours as needed for mild pain or moderate pain.   albuterol  (VENTOLIN  HFA) 108 (90 Base) MCG/ACT inhaler Inhale 2 puffs into the lungs every 6 (six) hours as needed for wheezing or shortness of breath.   Alcohol  Swabs (DROPSAFE ALCOHOL  PREP) 70 % PADS USE AS DIRECTED   amLODipine  (NORVASC ) 5 MG tablet TAKE 1 TABLET EVERY MORNING (Patient not taking: Reported on 11/03/2024)   azelastine  (ASTELIN ) 0.1 % nasal spray USE 2 SPRAYS IN EACH NOSTRIL TWICE DAILY AS DIRECTED   Blood Glucose Monitoring Suppl (ACCU-CHEK GUIDE) w/Device KIT 1 kit by Does not apply route daily.   budeson-glycopyrrolate -formoterol  (BREZTRI  AEROSPHERE) 160-9-4.8 MCG/ACT AERO inhaler Inhale 2 puffs into the lungs in the morning and at bedtime.   cetirizine (ZYRTEC) 10 MG tablet Take 10 mg by mouth at bedtime.    diclofenac  sodium (VOLTAREN ) 1 % GEL APPLY 2-4 GRAMS TO AFFECTED JOINTS UP TO FOUR TIMES DAILY   famotidine  (PEPCID ) 40 MG tablet Take 1 tablet (40 mg total) by mouth daily.   feeding supplement, ENSURE ENLIVE, (ENSURE ENLIVE) LIQD Take 237 mLs by mouth 3 (three) times daily between meals. (Patient taking differently: Take  237 mLs by mouth 2 (two) times daily between meals.)   fluticasone  (FLONASE  SENSIMIST) 27.5 MCG/SPRAY nasal spray Place 2 sprays into the nose in the morning and at bedtime.   fluticasone  (FLONASE ) 50 MCG/ACT nasal spray Place 1 spray into both nostrils in the morning and at bedtime.   glucose blood (ACCU-CHEK GUIDE) test strip Use as instructed   hydroxychloroquine  (PLAQUENIL ) 200 MG tablet TAKE 1 TABLET(200 MG) BY MOUTH DAILY   ipratropium-albuterol  (DUONEB) 0.5-2.5 (3) MG/3ML SOLN Take 3 mLs by nebulization every 4 (four) hours as needed.   ipratropium-albuterol  (DUONEB) 0.5-2.5 (3) MG/3ML SOLN Take 3 mLs by nebulization every 4 (four) hours as needed.   levofloxacin  (LEVAQUIN ) 250 MG tablet Take  2( 500 mg) po   first day and then 250 mg per daily for sinusitis gfr 35 (Patient not taking: Reported on 11/03/2024)   LINZESS  145 MCG CAPS capsule Take 145 mcg by mouth every morning.   Mepolizumab  (NUCALA ) 100 MG/ML SOAJ Inject 1 mL (100 mg total) into the skin every 28 (twenty-eight) days.   montelukast  (SINGULAIR ) 10 MG tablet TAKE 1 TABLET AT BEDTIME   Multiple Vitamins-Minerals (CENTRUM SILVER  50+WOMEN) TABS Take 1 tablet by mouth daily.   neomycin-bacitracin-polymyxin (NEOSPORIN) [phone] ointment Apply 1 Application topically daily as needed (on fingers).   pravastatin  (PRAVACHOL ) 40 MG tablet Take 1 tablet (40 mg total) by mouth daily.   Respiratory Therapy Supplies (FLUTTER) DEVI 1 Device by Does not apply route as directed.   Facility-Administered Encounter Medications as of 11/03/2024  Medication   diclofenac  Sodium (VOLTAREN ) 1 % topical gel  4 g    History: Past Medical History:  Diagnosis Date   Acute gastritis without bleeding 08/08/2018   Anemia    Anxiety    Asthma    Bipolar disorder (HCC)    CANDIDIASIS, ESOPHAGEAL 07/28/2009   Qualifier: Diagnosis of  By: Charlett MD, Apolinar POUR    Chronic kidney disease    CKD III   Complication of anesthesia    was told she stopped  breathing for one of her finger surgeries   COPD (chronic obstructive pulmonary disease) (HCC)    Depression    Diabetes mellitus without complication (HCC)    no meds   Dyspnea    At rest,and with activity   FH: colonic polyps    Fractured elbow    right    GERD (gastroesophageal reflux disease)    Headache    migraines   HH (hiatus hernia)    History of carpal tunnel syndrome    History of chest pain    History of transfusion of packed red blood cells    Hyperlipidemia    Hypertension    Mitral valve prolapse    Neuroleptic-induced tardive dyskinesia    Osteoarthritis of more than one site    Pneumonia    Seasonal allergies    Stroke [facility])    Supraventricular tachycardia    Past Surgical History:  Procedure Laterality Date   AMPUTATION Left 10/14/2018   Procedure: AMPUTATION LEFT LONG FINGER TIP;  Surgeon: Lorretta Dess, MD;  Location: [facility] OR;  Service: Plastics;  Laterality: Left;   ANTERIOR CERVICAL DECOMP/DISCECTOMY FUSION  09/27/2016   C5-6 anterior cervical discectomy and fusion, allograft and plate/notes 87/12/7980   ANTERIOR CERVICAL DECOMP/DISCECTOMY FUSION N/A 09/27/2016   Procedure: C5-6 Anterior Cervical Discectomy and Fusion, Allograft and Plate;  Surgeon: Oneil JAYSON Herald, MD;  Location: [facility] OR;  Service: Orthopedics;  Laterality: N/A;   Back Fusion  2002   BIOPSY  07/19/2018   Procedure: BIOPSY;  Surgeon: Rollin Dover, MD;  Location: [facility] ENDOSCOPY;  Service: Endoscopy;;   BIOPSY  02/13/2019   Procedure: BIOPSY;  Surgeon: Rollin Dover, MD;  Location: [facility] ENDOSCOPY;  Service: Endoscopy;;   CARPAL TUNNEL RELEASE  yates   left   COLONOSCOPY N/A 01/05/2014   Procedure: COLONOSCOPY;  Surgeon: Renaye Sous, MD;  Location: [facility] ENDOSCOPY;  Service: Endoscopy;  Laterality: N/A;   COLONOSCOPY N/A 08/01/2024   Procedure: COLONOSCOPY;  Surgeon: Rollin Dover, MD;  Location: [facility] ENDOSCOPY;  Service: Gastroenterology;  Laterality: N/A;   ELBOW SURGERY     age 3   ENTEROSCOPY N/A  02/13/2019   Procedure: ENTEROSCOPY;  Surgeon: Rollin Dover, MD;  Location: [facility] ENDOSCOPY;  Service: Endoscopy;  Laterality: N/A;   ESOPHAGOGASTRODUODENOSCOPY (EGD) WITH PROPOFOL  N/A 07/19/2018   Procedure: ESOPHAGOGASTRODUODENOSCOPY (EGD) WITH PROPOFOL ;  Surgeon: Rollin Dover, MD;  Location: [facility] ENDOSCOPY;  Service: Endoscopy;  Laterality: N/A;   EXTERNAL EAR SURGERY Left    EYE SURGERY     removed white dots under eyelid   FINGER SURGERY Left    INJECTION KNEE Right 07/21/2022   Procedure: INTRA-ARTICULAR INJECTION UNDER ANESTHESIA;  Surgeon: Herald Oneil JAYSON, MD;  Location: [facility] OR;  Service: Orthopedics;  Laterality: Right;   Juvara osteomy     KNEE SURGERY     NOSE SURGERY     POLYPECTOMY  08/01/2024   Procedure: POLYPECTOMY, INTESTINE;  Surgeon: Rollin Dover, MD;  Location: [facility] ENDOSCOPY;  Service: Gastroenterology;;   Rt. toe bunion     skin, shave biopsy  05/03/2016   Left occipital scalp, top of scalp   TONSILLECTOMY     TOTAL HIP ARTHROPLASTY Right 12/17/2020   TOTAL HIP ARTHROPLASTY Right 12/17/2020   Procedure: TOTAL HIP ARTHROPLASTY-DIRECT ANTERIOR;  Surgeon: Barbarann Oneil BROCKS, MD;  Location: [facility] OR;  Service: Orthopedics;  Laterality: Right;  needs RNFA   TOTAL KNEE ARTHROPLASTY Left 07/21/2022   Procedure: LEFT TOTAL KNEE ARTHROPLASTY;  Surgeon: Barbarann Oneil BROCKS, MD;  Location: [facility] OR;  Service: Orthopedics;  Laterality: Left;   UPPER EXTREMITY ANGIOGRAPHY Bilateral 10/11/2018   Procedure: UPPER EXTREMITY ANGIOGRAPHY;  Surgeon: Harvey Carlin BRAVO, MD;  Location: [facility] INVASIVE CV LAB;  Service: Cardiovascular;  Laterality: Bilateral;   Family History  Problem Relation Age of Onset   Diabetes Mother    Hypertension Mother    Heart attack Mother    Heart attack Father    Heart disease Father    Throat cancer Brother    Diabetes type II Brother    Heart disease Brother    Lung cancer Brother    Lung cancer Paternal Uncle    Lung cancer Daughter    Breast cancer Cousin    Social  History   Occupational History   Not on file  Tobacco Use   Smoking status: Former    Current packs/day: 0.00    Average packs/day: 2.0 packs/day for 30.0 years (60.0 ttl pk-yrs)    Types: Cigarettes    Start date: 10/24/1971    Quit date: 10/23/2001    Years since quitting: 23.0    Passive exposure: Past   Smokeless tobacco: Never  Vaping Use   Vaping status: Never Used  Substance and Sexual Activity   Alcohol  use: No   Drug use: No   Sexual activity: Not on file   Tobacco Counseling Counseling given: No  SDOH Screenings   Food Insecurity: No Food Insecurity (11/03/2024)  Housing: Low Risk (11/03/2024)  Transportation Needs: No Transportation Needs (11/03/2024)  Utilities: Not At Risk (11/03/2024)  Alcohol  Screen: Low Risk (03/03/2022)  Depression (PHQ2-9): Low Risk (11/03/2024)  Financial Resource Strain: Medium Risk (07/11/2022)  Physical Activity: Inactive (11/03/2024)  Social Connections: Moderately Integrated (11/03/2024)  Stress: No Stress Concern Present (11/03/2024)  Tobacco Use: Medium Risk (11/03/2024)  Health Literacy: Adequate Health Literacy (11/03/2024)   See flowsheets for full screening details  Depression Screen PHQ 2 & 9 Depression Scale- Over the past 2 weeks, how often have you been bothered by any of the following problems? Little interest or pleasure in doing things: 0 Feeling down, depressed, or hopeless (PHQ Adolescent also includes...irritable): 0 PHQ-2 Total Score: 0     Goals Addressed               This Visit's Progress     Remain active (pt-stated)               Objective:    Today's Vitals   11/03/24 1537  Weight: 153 lb (69.4 kg)  Height: 5' 3 (1.6 m)   Body mass index is 27.1 kg/m.  Hearing/Vision screen Hearing Screening - Comments:: Denies hearing difficulties   Vision Screening - Comments:: Wears rx glasses - up to date with routine eye exams with  Eye Consultant Immunizations and Health Maintenance Health Maintenance   Topic Date Due   Mammogram  02/21/2022   FOOT EXAM  04/02/2024   COVID-19 Vaccine (7 - 2025-26 season) 06/23/2024   HEMOGLOBIN A1C  04/07/2025   OPHTHALMOLOGY EXAM  04/16/2025   Diabetic kidney evaluation -  eGFR measurement  09/03/2025   Diabetic kidney evaluation - Urine ACR  10/07/2025   Medicare Annual Wellness (AWV)  11/03/2025   DTaP/Tdap/Td (3 - Td or Tdap) 04/25/2026   Colonoscopy  08/01/2034   Pneumococcal Vaccine: 50+ Years  Completed   Influenza Vaccine  Completed   Bone Density Scan  Completed   Hepatitis C Screening  Completed   Zoster Vaccines- Shingrix  Completed   Meningococcal B Vaccine  Aged Out        Assessment/Plan:  This is a routine wellness examination for [facility].  Patient Care Team: [name], Apolinar POUR, MD as PCP - General (Internal Medicine) Delford Maude BROCKS, MD as PCP - Cardiology (Cardiology) Barbarann Oneil BROCKS, MD (Inactive) (Orthopedic Surgery) Cottle, Lorene KANDICE Raddle., MD as Attending Physician (Psychiatry) Kristie Lamprey, MD as Consulting Physician (Gastroenterology) Tobie Gordy POUR, MD as Consulting Physician (Nephrology) Ivin Kocher, MD as Consulting Physician (Dermatology) Leva Rush, MD as Consulting Physician (Obstetrics and Gynecology) Volanda Charlie BROCKS, DPM (Inactive) as Consulting Physician (Podiatry) Alaine Vicenta NOVAK, MD as Consulting Physician (Pulmonary Disease) Dolphus Reiter, MD as Consulting Physician (Rheumatology) Odean Potts, MD as Consulting Physician (Hematology and Oncology) Rhys Verneita ONEIDA DEVONNA as Physician Assistant (Psychiatry) Gretta Leita SQUIBB, DO as Consulting Physician (Pulmonary Disease) Barbarann Oneil BROCKS, MD (Inactive) as Consulting Physician (Orthopedic Surgery) Barbarann Oneil BROCKS, MD (Inactive) as Consulting Physician (Orthopedic Surgery) Cyrus Carwin, MD as Consulting Physician (Ophthalmology)  I have personally reviewed and noted the following in the patients chart:   Medical and social history Use of alcohol , tobacco or  illicit drugs  Current medications and supplements including opioid prescriptions. Functional ability and status Nutritional status Physical activity Advanced directives List of other physicians Hospitalizations, surgeries, and ER visits in previous 12 months Vitals Screenings to include cognitive, depression, and falls Referrals and appointments  No orders of the defined types were placed in this encounter.  In addition, I have reviewed and discussed with patient certain preventive protocols, quality metrics, and best practice recommendations. A written personalized care plan for preventive services as well as general preventive health recommendations were provided to patient.   Rojelio LELON Blush, LPN   8/87/7973   Return in 1 year (on 11/06/2025).  After Visit Summary: (MyChart) Due to this being a telephonic visit, the after visit summary with patients personalized plan was offered to patient via MyChart   Nurse Notes: No voiced or noted concerns at this time "

## 2024-11-11 ENCOUNTER — Other Ambulatory Visit: Payer: Self-pay

## 2024-11-11 ENCOUNTER — Other Ambulatory Visit: Payer: Self-pay | Admitting: Pharmacy Technician

## 2024-11-11 NOTE — Progress Notes (Signed)
 Specialty Pharmacy Refill Coordination Note  Desiree [name] is a 74 y.o. female contacted today regarding refills of specialty medication(s) Mepolizumab  (Nucala )   Patient requested Delivery   Delivery date: 11/18/24   Verified address: [address] Desiree [name] [address]   Medication will be filled on: 11/17/24

## 2024-11-12 ENCOUNTER — Other Ambulatory Visit: Payer: Self-pay

## 2024-11-12 NOTE — Progress Notes (Signed)
 Rescheduling patient's delivery to 11/14/24 due to expected winter weather. Patient is aware

## 2024-11-13 ENCOUNTER — Telehealth: Payer: Self-pay | Admitting: *Deleted

## 2024-11-13 ENCOUNTER — Other Ambulatory Visit: Payer: Self-pay

## 2024-11-13 NOTE — Telephone Encounter (Signed)
 Returned call to [facility] pharmacy Spoke with Amy and notified Pt will self administer Nucala     Copied from CRM #[id number]. Topic: Clinical - Medication Question >> Nov 11, 2024 11:34 AM Dedra NOVAK wrote: Reason for CRM: Amy from [facility] called to see if patient gets her Mepolizumab  (NUCALA ) 100 MG/ML SOAJ injections at home or in the clinic. Please call Amy at [phone].

## 2024-11-17 ENCOUNTER — Ambulatory Visit (INDEPENDENT_AMBULATORY_CARE_PROVIDER_SITE_OTHER): Admitting: Otolaryngology

## 2024-11-18 ENCOUNTER — Telehealth: Payer: Self-pay | Admitting: Podiatry

## 2024-11-18 NOTE — Telephone Encounter (Signed)
 Called and scheduled patient for surgery on 12/08/2024. Patient not on any GLP1 or blood thinners. Pharmacy correct in chart.

## 2024-11-19 ENCOUNTER — Ambulatory Visit: Payer: Self-pay | Admitting: Podiatry

## 2024-11-20 ENCOUNTER — Telehealth: Payer: Self-pay | Admitting: Podiatry

## 2024-11-20 NOTE — Telephone Encounter (Signed)
 DOS- 12/08/2024  METATARSAL OSTEOTOMY 2ND RT- 28308 HAMMERTOE REPAIR 2ND RT- 28285 HALLUX MPJ FUSION RT- [id number]  [facility] EFFECTIVE DATE- 10/23/2024  DEDUCTIBLE- $0 REMAINING- $0 OOP- $4200 REMAINING- $4200 COINSURANCE- 0%  PER [employer] PORTAL, PRIOR AUTH IS NOT REQUIRED FOR CPT CODES 71691, [id number], AND [id number]. DECISION ID# [id number]

## 2024-11-25 ENCOUNTER — Ambulatory Visit (INDEPENDENT_AMBULATORY_CARE_PROVIDER_SITE_OTHER): Admitting: Otolaryngology

## 2024-12-09 ENCOUNTER — Ambulatory Visit: Admitting: Pulmonary Disease

## 2024-12-11 ENCOUNTER — Ambulatory Visit (INDEPENDENT_AMBULATORY_CARE_PROVIDER_SITE_OTHER): Admitting: Otolaryngology

## 2024-12-16 ENCOUNTER — Encounter

## 2024-12-30 ENCOUNTER — Encounter: Admitting: Podiatry

## 2025-01-14 ENCOUNTER — Ambulatory Visit: Admitting: Internal Medicine

## 2025-01-20 ENCOUNTER — Encounter: Admitting: Podiatry

## 2025-02-02 ENCOUNTER — Ambulatory Visit: Admitting: Physician Assistant

## 2025-02-02 ENCOUNTER — Ambulatory Visit: Admitting: Internal Medicine

## 2025-04-14 ENCOUNTER — Ambulatory Visit: Admitting: Internal Medicine

## 2025-11-06 ENCOUNTER — Ambulatory Visit
